# Patient Record
Sex: Male | Born: 1960
Health system: Southern US, Community
[De-identification: ages and names within clinical notes are randomized; demographics above are authoritative.]

## PROBLEM LIST (undated history)

## (undated) DIAGNOSIS — K219 Gastro-esophageal reflux disease without esophagitis: Secondary | ICD-10-CM

## (undated) DIAGNOSIS — F1910 Other psychoactive substance abuse, uncomplicated: Secondary | ICD-10-CM

## (undated) DIAGNOSIS — C801 Malignant (primary) neoplasm, unspecified: Secondary | ICD-10-CM

## (undated) DIAGNOSIS — K589 Irritable bowel syndrome without diarrhea: Secondary | ICD-10-CM

## (undated) DIAGNOSIS — E785 Hyperlipidemia, unspecified: Secondary | ICD-10-CM

## (undated) DIAGNOSIS — K5790 Diverticulosis of intestine, part unspecified, without perforation or abscess without bleeding: Secondary | ICD-10-CM

## (undated) DIAGNOSIS — E079 Disorder of thyroid, unspecified: Secondary | ICD-10-CM

## (undated) DIAGNOSIS — E039 Hypothyroidism, unspecified: Secondary | ICD-10-CM

## (undated) DIAGNOSIS — Z8719 Personal history of other diseases of the digestive system: Secondary | ICD-10-CM

## (undated) DIAGNOSIS — I10 Essential (primary) hypertension: Secondary | ICD-10-CM

## (undated) HISTORY — PX: OTHER SURGICAL HISTORY: SHX169

## (undated) HISTORY — DX: Irritable bowel syndrome, unspecified: K58.9

## (undated) HISTORY — DX: Other psychoactive substance abuse, uncomplicated: F19.10

## (undated) HISTORY — DX: Disorder of thyroid, unspecified: E07.9

## (undated) HISTORY — PX: FINGER SURGERY: SHX640

## (undated) HISTORY — DX: Hyperlipidemia, unspecified: E78.5

## (undated) HISTORY — DX: Personal history of other diseases of the digestive system: Z87.19

## (undated) HISTORY — DX: Gastro-esophageal reflux disease without esophagitis: K21.9

## (undated) HISTORY — DX: Diverticulosis of intestine, part unspecified, without perforation or abscess without bleeding: K57.90

---

## 1999-06-10 ENCOUNTER — Encounter: Payer: Self-pay | Admitting: Family Medicine

## 1999-06-10 ENCOUNTER — Ambulatory Visit (HOSPITAL_COMMUNITY): Admission: RE | Admit: 1999-06-10 | Discharge: 1999-06-10 | Payer: Self-pay | Admitting: Family Medicine

## 2001-08-20 ENCOUNTER — Encounter: Payer: Self-pay | Admitting: Emergency Medicine

## 2001-08-20 ENCOUNTER — Observation Stay (HOSPITAL_COMMUNITY): Admission: EM | Admit: 2001-08-20 | Discharge: 2001-08-21 | Payer: Self-pay | Admitting: Emergency Medicine

## 2001-11-09 ENCOUNTER — Ambulatory Visit (HOSPITAL_BASED_OUTPATIENT_CLINIC_OR_DEPARTMENT_OTHER): Admission: RE | Admit: 2001-11-09 | Discharge: 2001-11-09 | Payer: Self-pay | Admitting: Orthopedic Surgery

## 2002-11-20 ENCOUNTER — Inpatient Hospital Stay (HOSPITAL_COMMUNITY): Admission: AD | Admit: 2002-11-20 | Discharge: 2002-11-22 | Payer: Self-pay | Admitting: Obstetrics & Gynecology

## 2002-11-22 ENCOUNTER — Encounter (INDEPENDENT_AMBULATORY_CARE_PROVIDER_SITE_OTHER): Payer: Self-pay | Admitting: *Deleted

## 2004-07-23 ENCOUNTER — Emergency Department (HOSPITAL_COMMUNITY): Admission: EM | Admit: 2004-07-23 | Discharge: 2004-07-23 | Payer: Self-pay | Admitting: Emergency Medicine

## 2005-01-09 ENCOUNTER — Emergency Department (HOSPITAL_COMMUNITY): Admission: EM | Admit: 2005-01-09 | Discharge: 2005-01-09 | Payer: Self-pay | Admitting: Emergency Medicine

## 2005-01-09 IMAGING — CT CT PELVIS W/ CM
4 of 6 series · 12 of 46 positions shown, 18 images · IV contrast (omnipaque)
Comparison: none

CLINICAL DATA: Left sided chest and abdominal pain for four days.  History of diverticulosis and left sided kidney stones. 
CT ANGIOGRAPHY OF CHEST:
TECHNIQUE: Multidetector CT imaging of the chest was performed during bolus injection of intravenous contrast.  Multiplanar CT angiographic image reconstructions were generated to evaluate the vascular anatomy.
Contrast:  150 cc Omnipaque 300
No pathologically enlarged axillary lymphadenopathy.  Mild bilateral hilar lymphadenopathy is noted.  There are no pleural or pericardial effusions.  Multiple tiny cysts are seen throughout the lungs.  The pattern and distribution is not typical for emphysema.  No pleural effusions are identified.  There is no pericardial effusion.  No suspicious pulmonary nodules or masses are noted.
TECHNIQUE: Multidetector CT imaging of the abdomen was performed following the standard protocol during bolus administration of intravenous contrast.
TECHNIQUE: Multidetector CT imaging of the pelvis was performed following the standard protocol during bolus administration of intravenous contrast.

[Series 171: — · axial · 0.70mm/px · z∈[+1293,+1523]mm · 3 of 94 slices shown, 7 images (1 of 2)]
[im 24/94  soft-tissue]
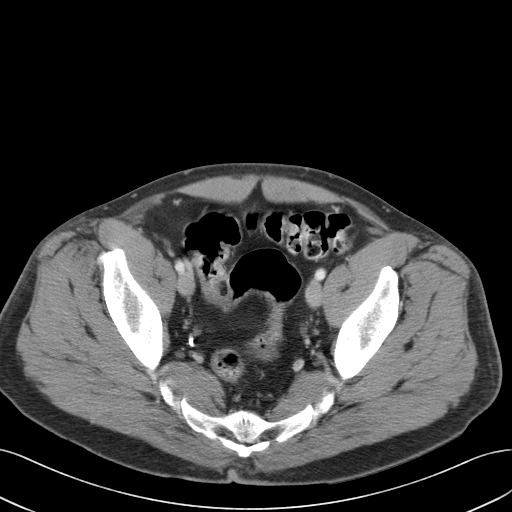
[im 24/94  lung]
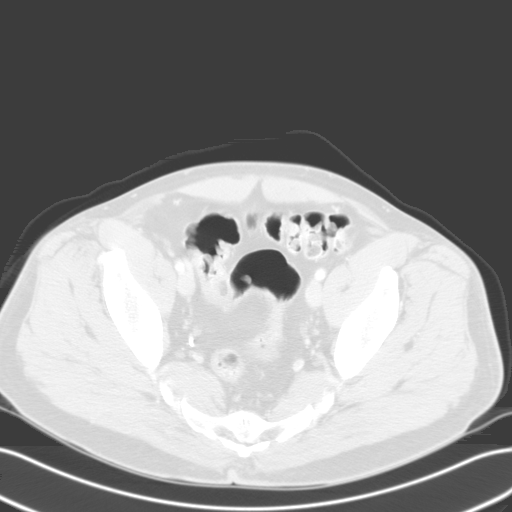
[im 24/94  bone]
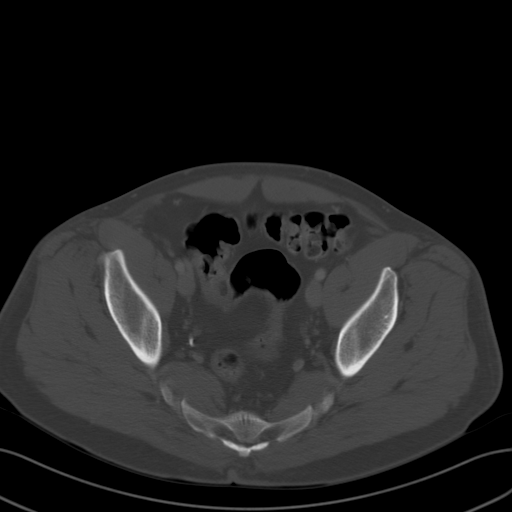
[im 47/94  soft-tissue]
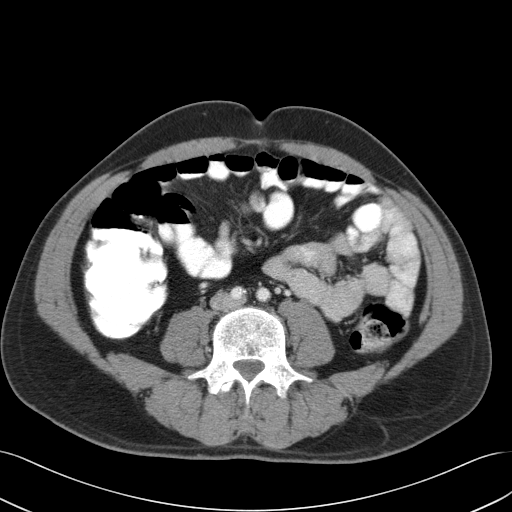
[im 47/94  lung]
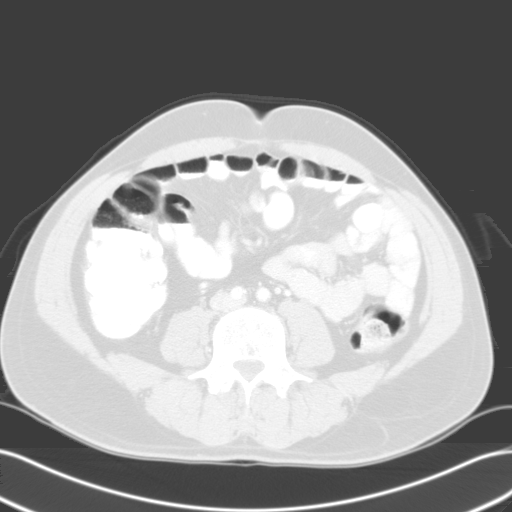
[im 70/94  soft-tissue]
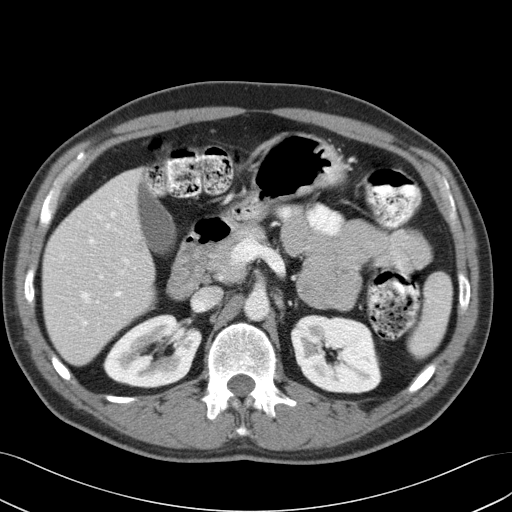
[im 70/94  lung]
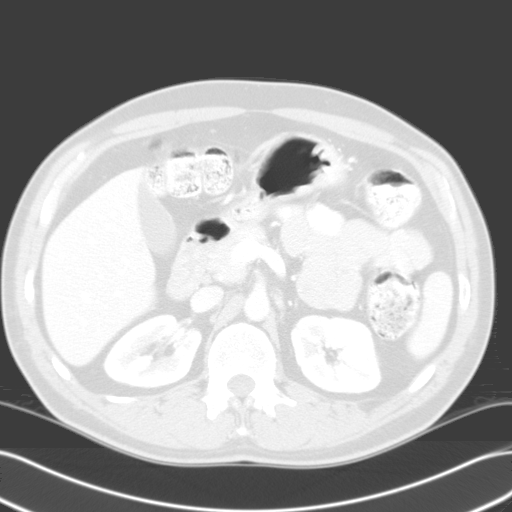

[Series 174: — · axial · 0.88mm/px · z∈[+1629,+1734]mm · 5 of 373 slices shown (2 of 2)]
[im 40/373  soft-tissue]
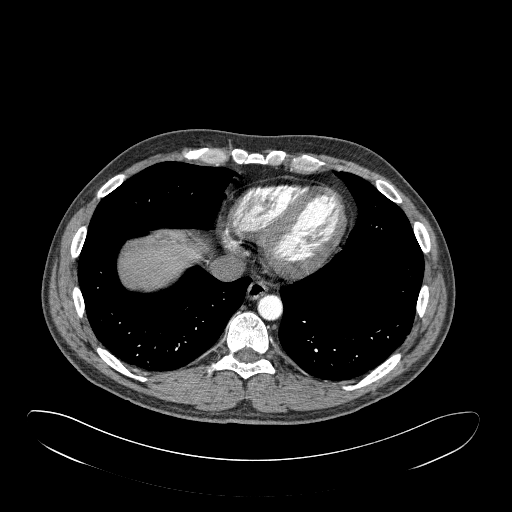
[im 79/373  soft-tissue]
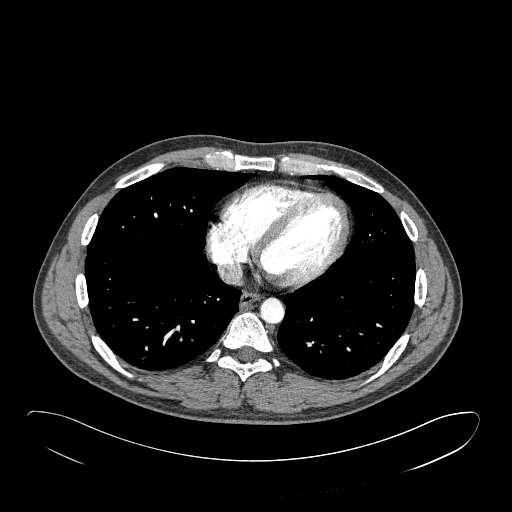
[im 118/373  soft-tissue]
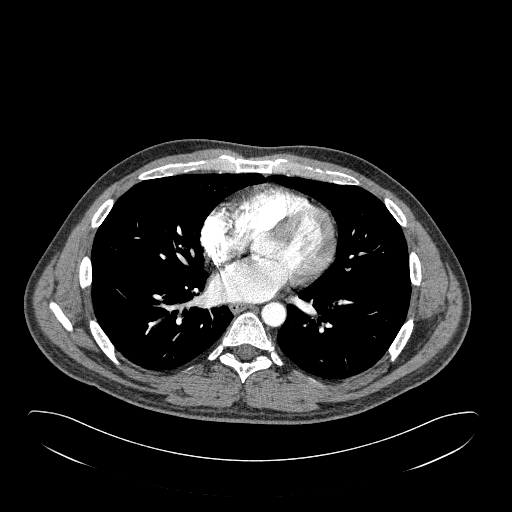
[im 157/373  soft-tissue]
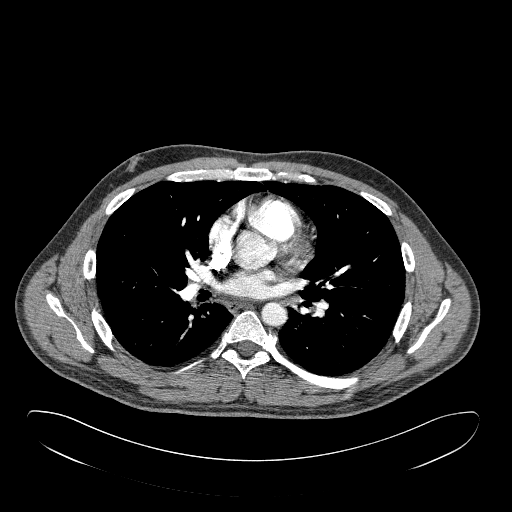
[im 216/373  soft-tissue]
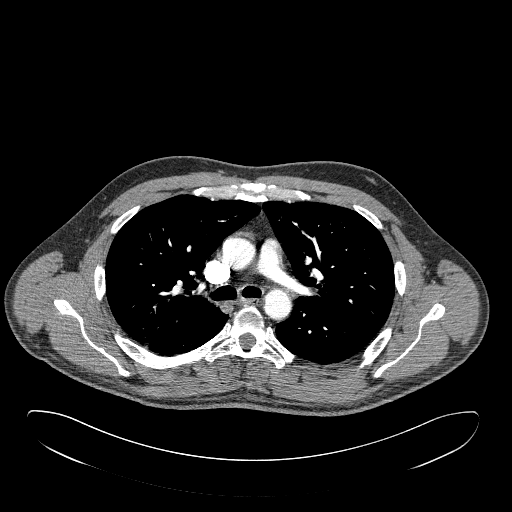

[coronals · coronal · 0.88mm/px · 3 of 60 slices shown, 4 images]
[im 20/60  soft-tissue]
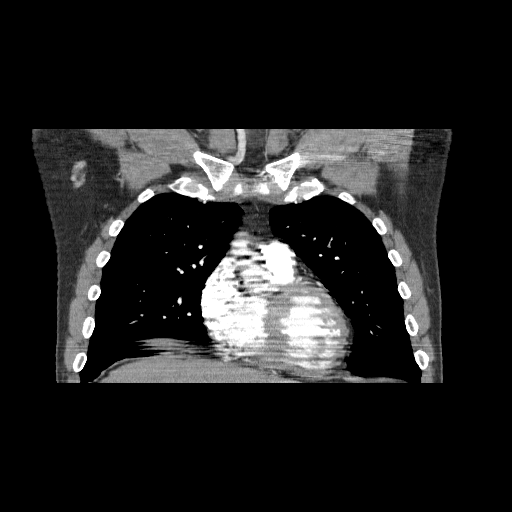
[im 27/60  soft-tissue]
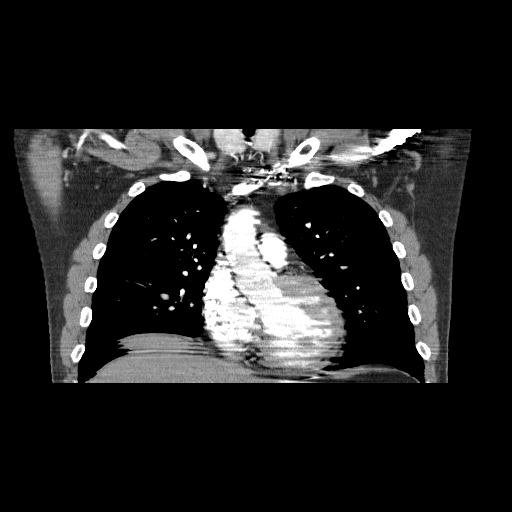
[im 27/60  bone]
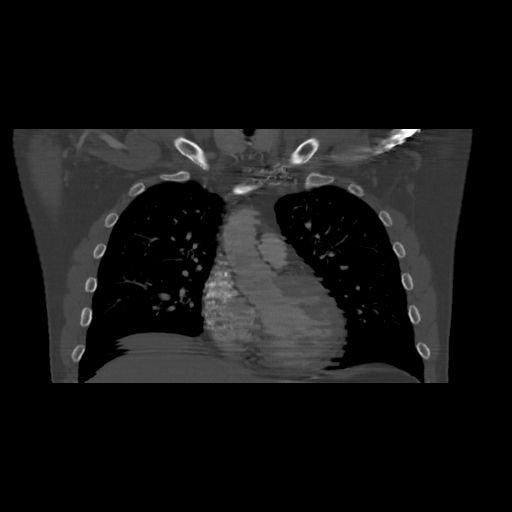
[im 33/60  soft-tissue]
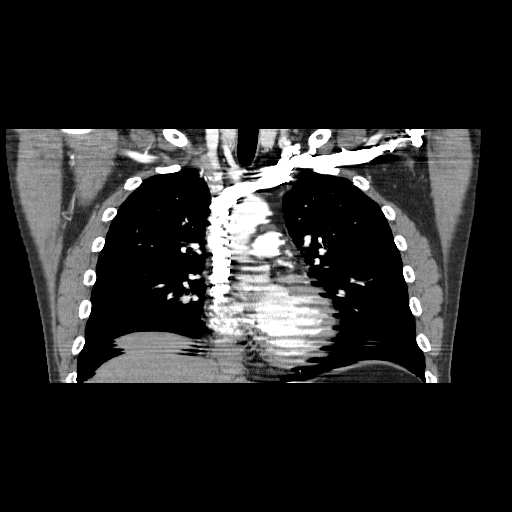

[sagittals · sagittal · 0.88mm/px · 1 of 70 slices shown, 2 images]
[im 24/70  soft-tissue]
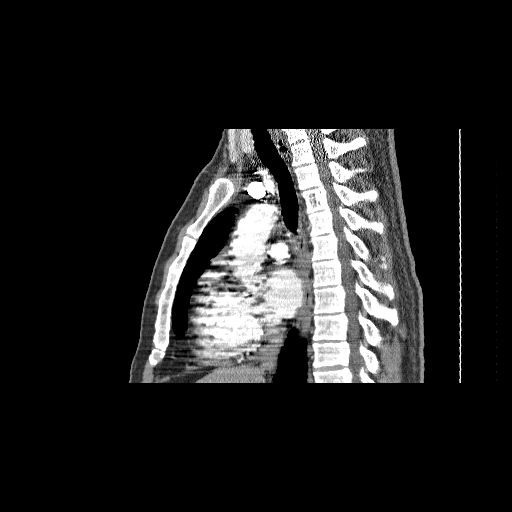
[im 24/70  bone]
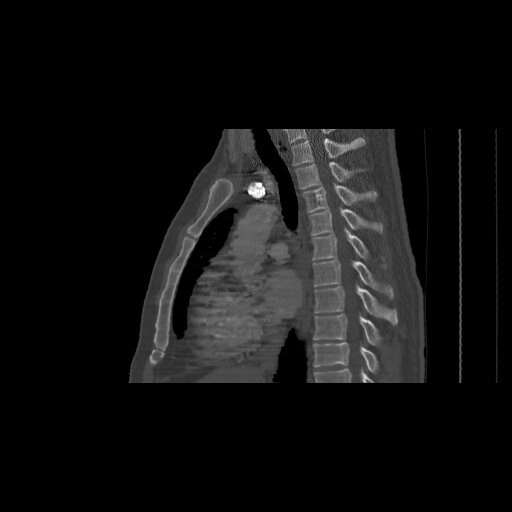

[12 of 46 positions shown; findings below may reference images not displayed]

IMPRESSION: 1.  No acute cardiopulmonary abnormalities. No evidence for pulmonary emboli.
2.  Mild bilateral hilar lymphadenopathy.  Largest is on the left measuring 13.2 x 8.3 mm.  This may be inflammatory or infectious in etiology such as from granulomatous inflammation.  Tumor is less favored.  
ABDOMEN CT WITH CONTRAST:
FINDINGS: Liver is normal in attenuation and morphology.  
Negative. 
Adrenal glands negative.  Pancreas is negative.  Kidneys are negative.  Spleen negative. No pathologically enlarged retroperitoneal or mesenteric lymphadenopathy.  No free fluid.  Visualized bowel loops are unremarkable.
IMPRESSION: No acute abdomen findings.
PELVIS CT WITH CONTRAST:
FINDINGS: Negative for pelvic lymphadenopathy.  No free pelvic fluid.  The pelvic bowel loops are negative.  
No inguinal lymphadenopathy.
IMPRESSION: No acute pelvic CT findings.

## 2006-08-09 ENCOUNTER — Emergency Department (HOSPITAL_COMMUNITY): Admission: EM | Admit: 2006-08-09 | Discharge: 2006-08-10 | Payer: Self-pay | Admitting: Emergency Medicine

## 2006-08-10 ENCOUNTER — Ambulatory Visit: Payer: Self-pay | Admitting: Psychiatry

## 2006-08-10 ENCOUNTER — Inpatient Hospital Stay (HOSPITAL_COMMUNITY): Admission: EM | Admit: 2006-08-10 | Discharge: 2006-08-14 | Payer: Self-pay | Admitting: Psychiatry

## 2006-12-24 ENCOUNTER — Emergency Department (HOSPITAL_COMMUNITY): Admission: EM | Admit: 2006-12-24 | Discharge: 2006-12-24 | Payer: Self-pay | Admitting: Emergency Medicine

## 2006-12-26 ENCOUNTER — Emergency Department (HOSPITAL_COMMUNITY): Admission: EM | Admit: 2006-12-26 | Discharge: 2006-12-27 | Payer: Self-pay | Admitting: Emergency Medicine

## 2007-07-16 ENCOUNTER — Emergency Department (HOSPITAL_COMMUNITY): Admission: EM | Admit: 2007-07-16 | Discharge: 2007-07-16 | Payer: Self-pay | Admitting: Emergency Medicine

## 2010-07-31 ENCOUNTER — Emergency Department (HOSPITAL_COMMUNITY)
Admission: EM | Admit: 2010-07-31 | Discharge: 2010-07-31 | Disposition: A | Discharge status: Home / Self Care | Payer: Managed Care, Other (non HMO) | Attending: Emergency Medicine | Admitting: Emergency Medicine

## 2010-07-31 ENCOUNTER — Emergency Department (HOSPITAL_COMMUNITY): Payer: Managed Care, Other (non HMO)

## 2010-07-31 DIAGNOSIS — F17200 Nicotine dependence, unspecified, uncomplicated: Secondary | ICD-10-CM | POA: Insufficient documentation

## 2010-07-31 DIAGNOSIS — IMO0002 Reserved for concepts with insufficient information to code with codable children: Secondary | ICD-10-CM | POA: Insufficient documentation

## 2010-07-31 IMAGING — CR DG HIP (WITH OR WITHOUT PELVIS) 2-3V*L*
3 series · 3 of 3 positions shown · non-contrast
Comparison: None.

CLINICAL DATA: Low back pain radiates to the left hip.

LEFT HIP - COMPLETE 2+ VIEW

[view not recorded (1 of 3)]
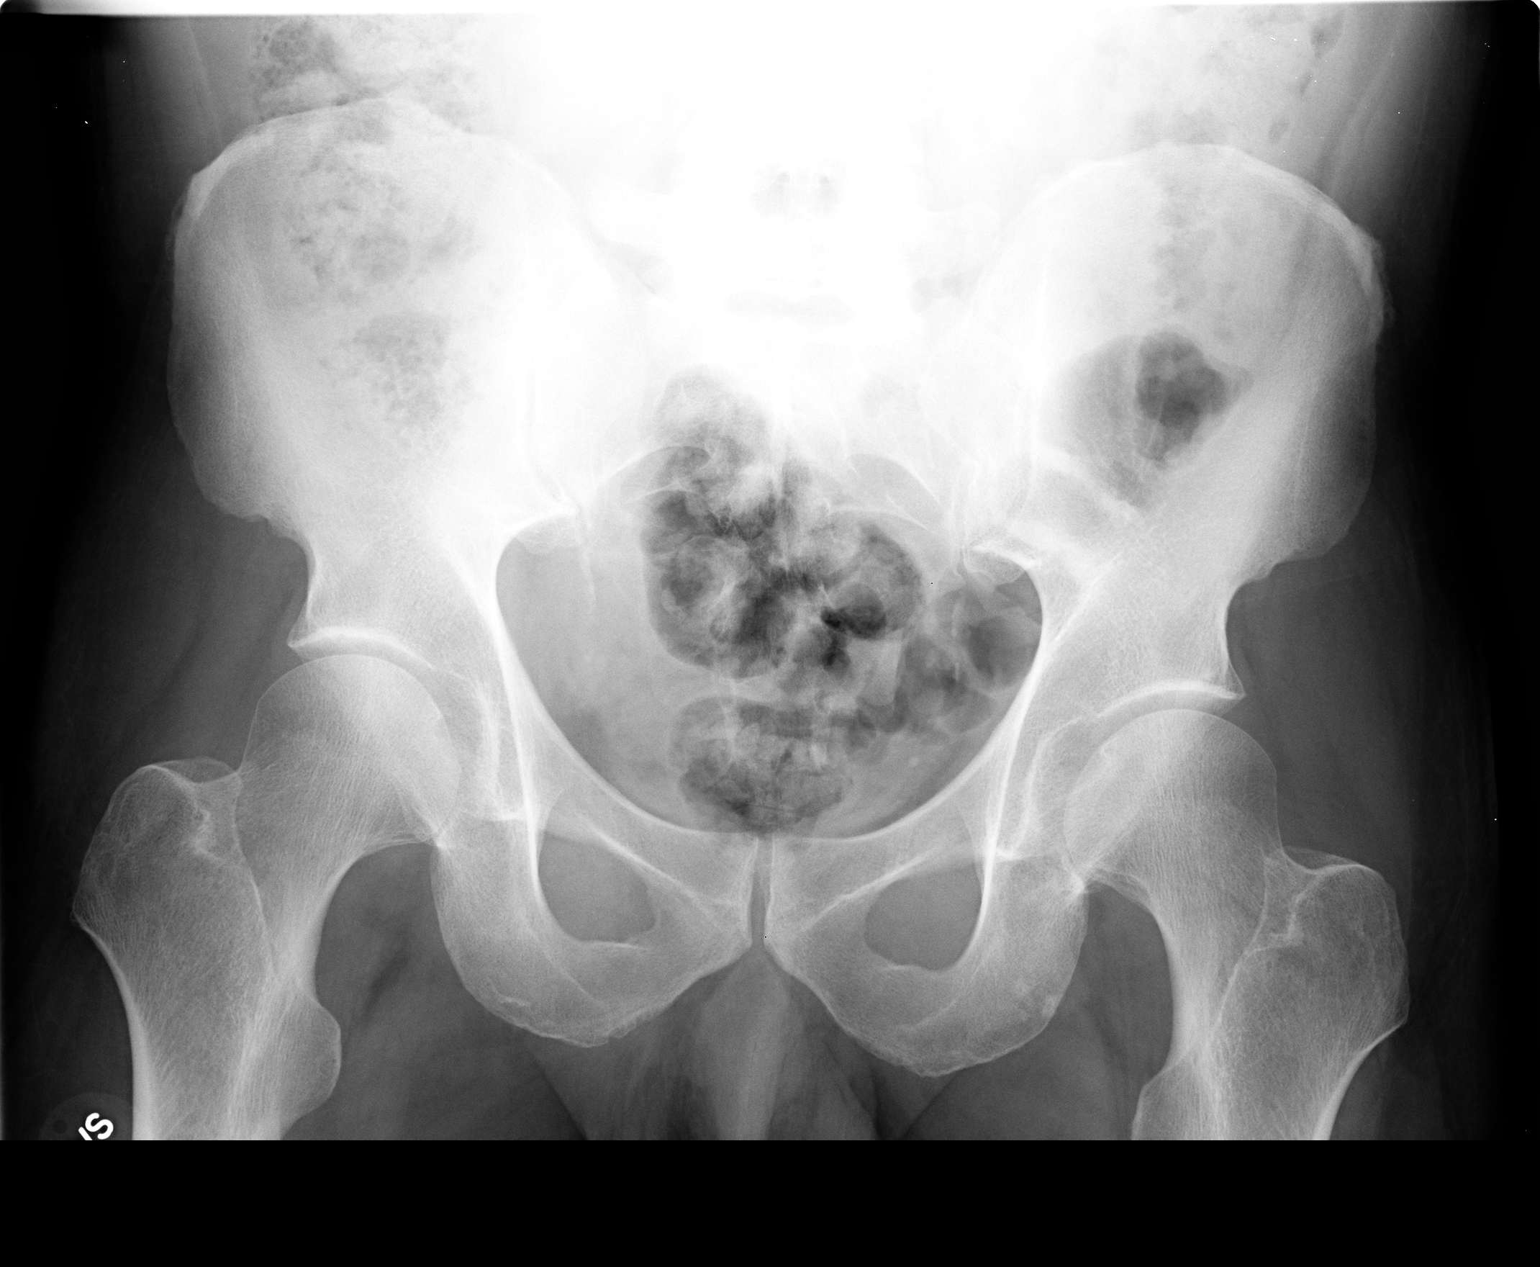

[view not recorded (2 of 3)]
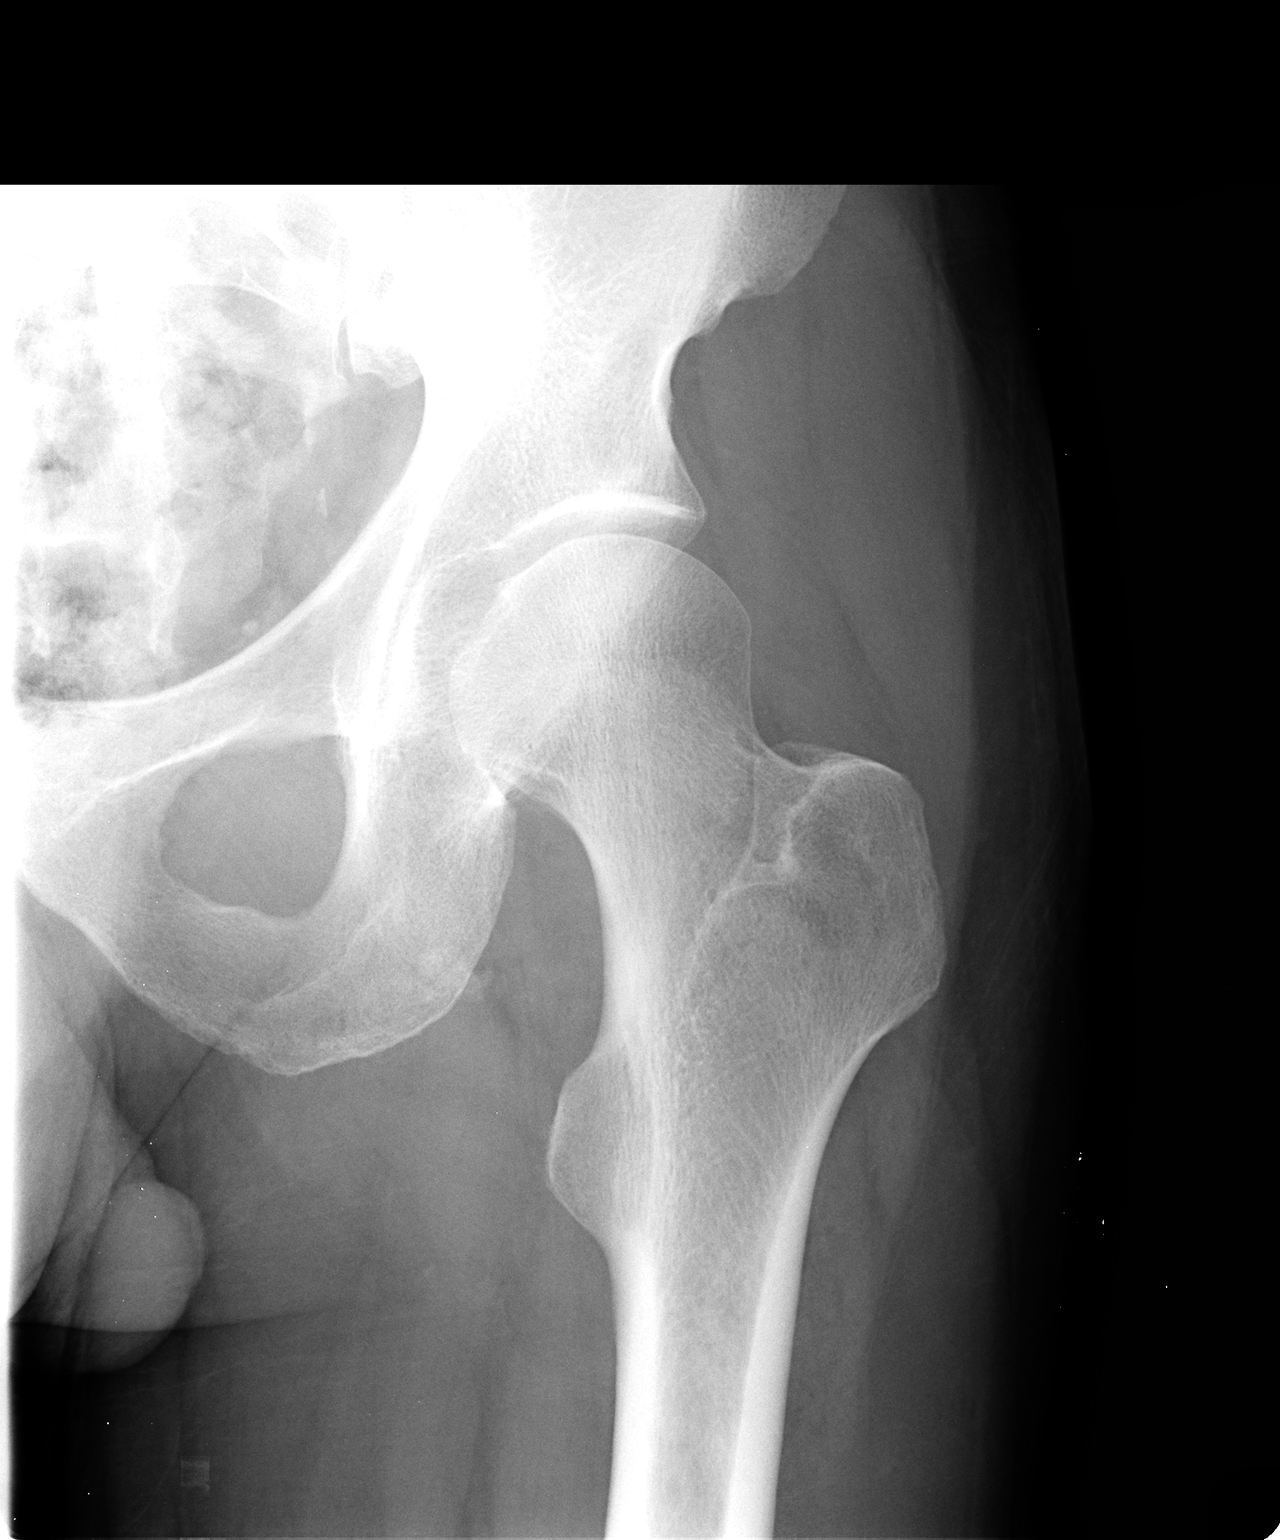

[view not recorded (3 of 3)]
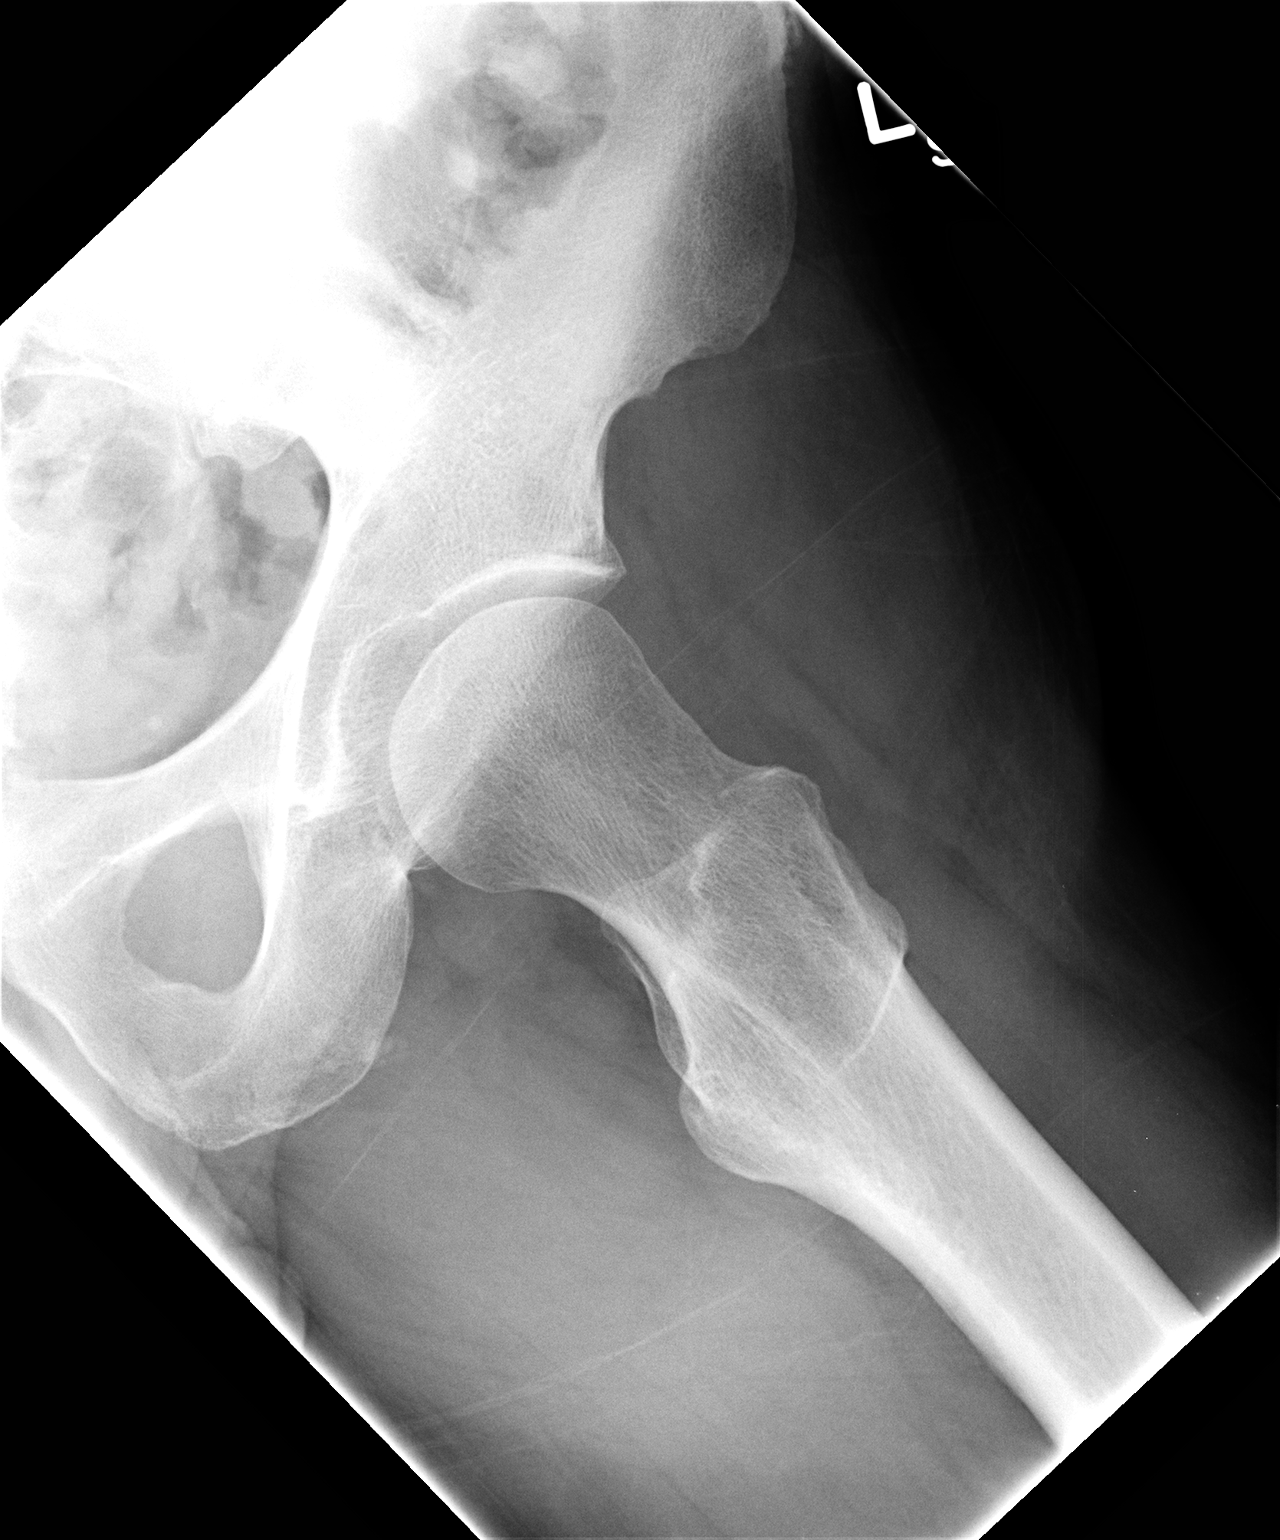

[3 of 3 positions shown; findings below may reference images not displayed]

FINDINGS: Frontal pelvis shows no evidence for fracture.  SI joints
and symphysis pubis are normal.  Joint space in the hips is well
preserved and symmetric.  No substantial degenerative change in
either hip.

AP and frog-leg lateral views of the left hip are normal.
IMPRESSION: No radiographic evidence to explain the patient's history of pain.

## 2010-07-31 IMAGING — CR DG LUMBAR SPINE COMPLETE 4+V
5 series · 5 of 5 positions shown · non-contrast
Comparison: None.

CLINICAL DATA: Left back pain.

LUMBAR SPINE - COMPLETE 4+ VIEW

[view not recorded (1 of 5)]
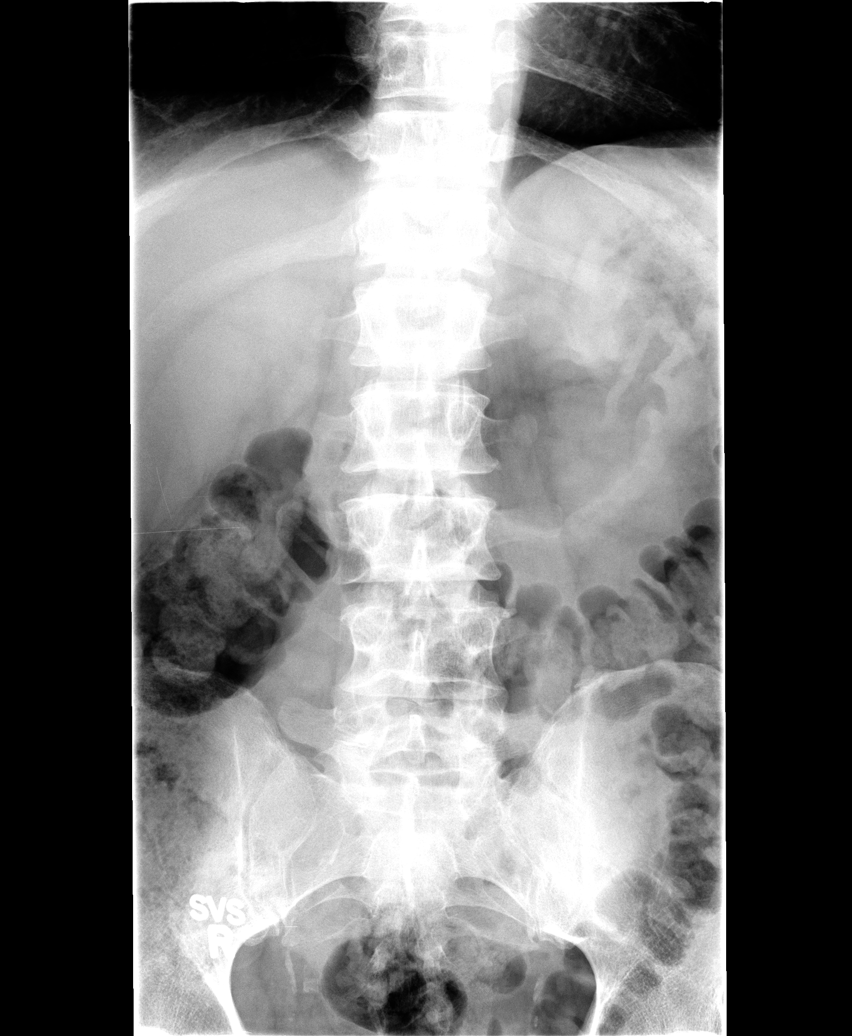

[view not recorded (2 of 5)]
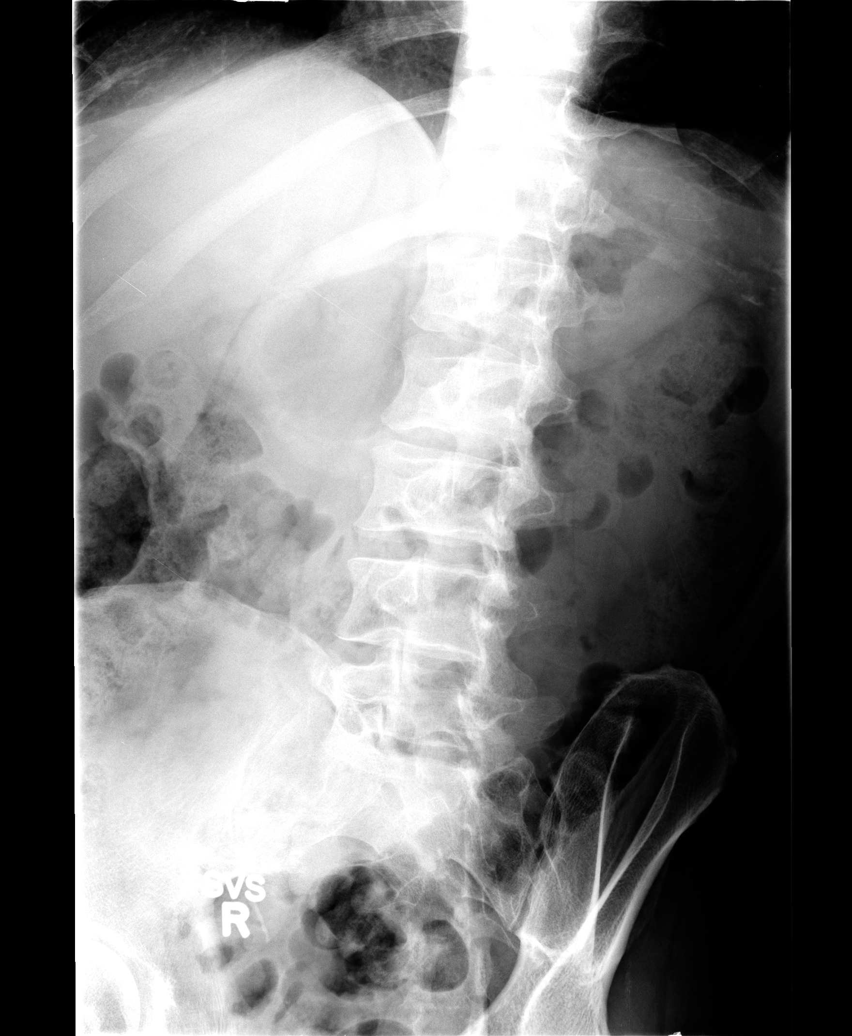

[view not recorded (3 of 5)]
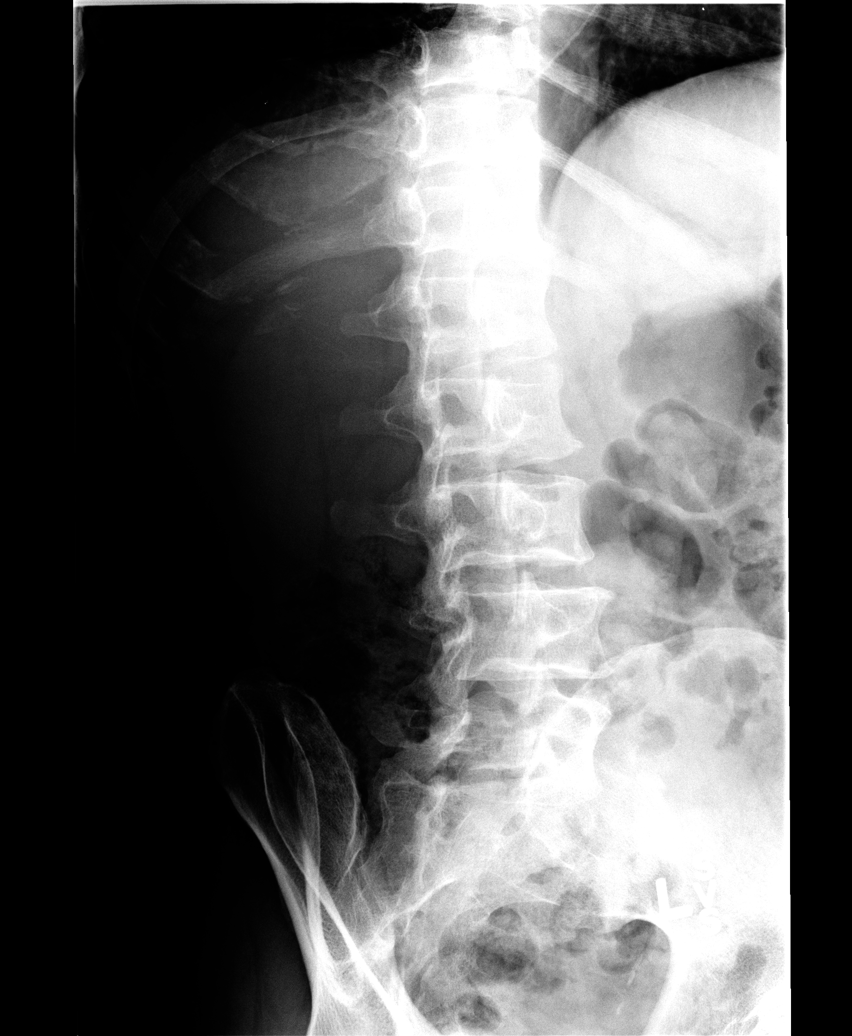

[view not recorded (4 of 5)]
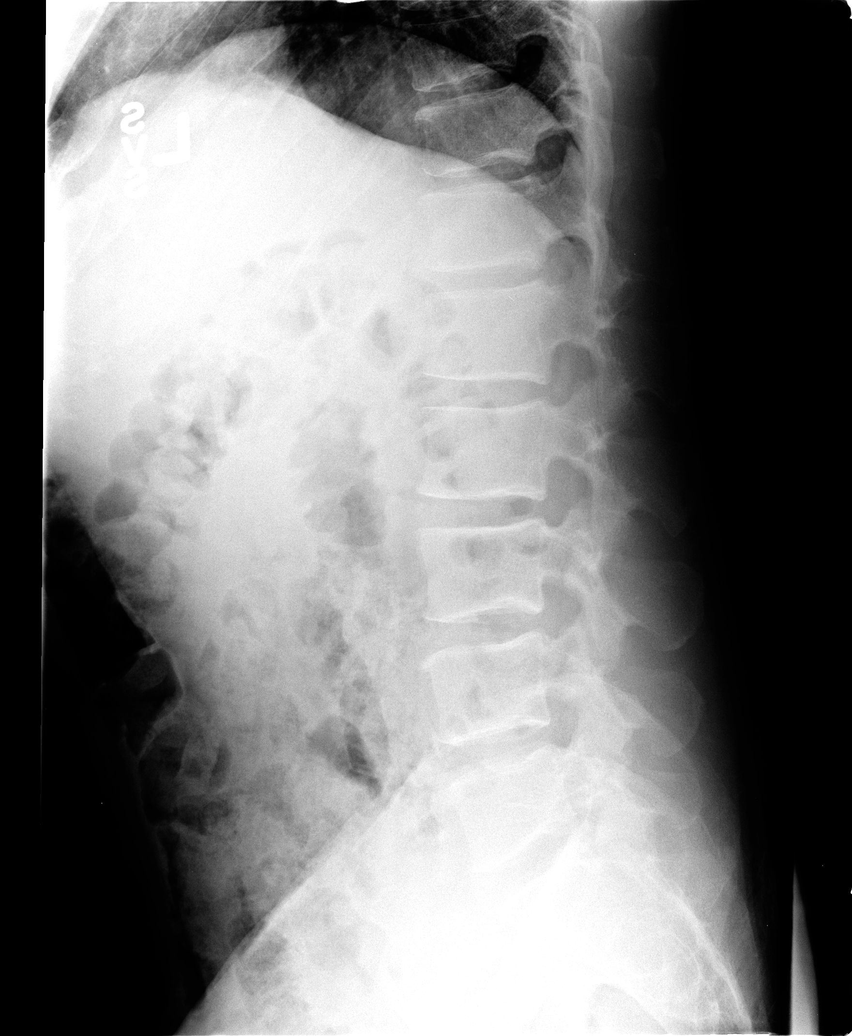

[view not recorded (5 of 5)]
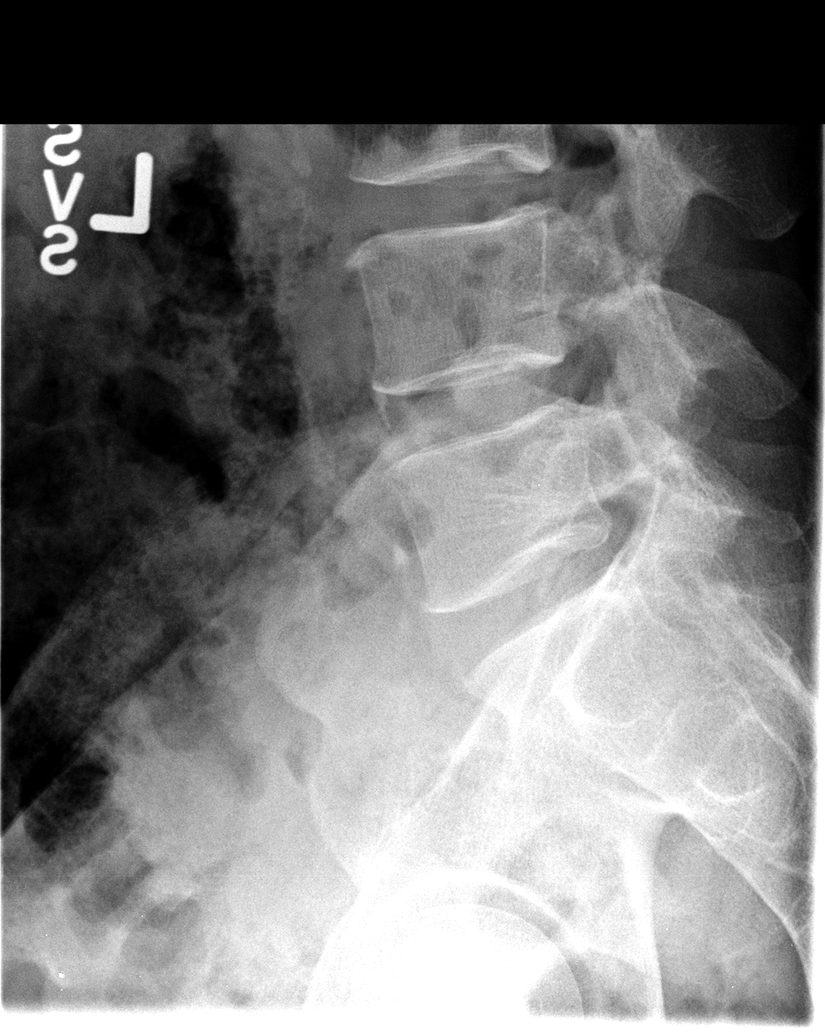

[5 of 5 positions shown; findings below may reference images not displayed]

FINDINGS: No evidence for an acute fracture.  No subluxation.  No
substantial loss of intervertebral disc height.  The facets are
well-aligned bilaterally.  SI joints are normal.
IMPRESSION: No acute bony findings.

## 2010-09-10 NOTE — H&P (Signed)
NAME:  Joseph Atkins, Joseph Atkins                      ACCOUNT NO.:  [id number]   MEDICAL RECORD NO.:  [id number]                   PATIENT TYPE:  INP   LOCATION:                                       FACILITY:  [facility]   PHYSICIAN:  Barbette Hair. Arlyce Dice, M.D. [facility]          DATE OF BIRTH:  09-30-1960   DATE OF ADMISSION:  11/20/2002  DATE OF DISCHARGE:                                HISTORY & PHYSICAL   PROBLEM:  Abdominal pain.   REASON FOR ADMISSION:  The patient is a 50 year old white male with a three  day history of severe left-sided abdominal pain.  He had similar, but much  less severe pain approximately one month ago.  Over the last three days he  has been complaining of sharp and aching left lower quadrant pain.  It is  worse with sitting and with walking.  It is unaffected by eating or bowel  movements.  He is without fever, chills, nausea, or vomiting.  There is no  history of melena or hematochezia.  He is a heavy smoker, smoking two to  three packs a day.  Today a CBC was pertinent for a white count of 11.2.  Urinalysis was normal.  He underwent a CT scan at [facility] that  demonstrated segmental thickening of the left colonic wall.  No diverticular  changes were seen.   PAST MEDICAL HISTORY:  1. Kidney stones.  2. Sleep apnea.   FAMILY HISTORY:  Noncontributory.   MEDICATIONS:  He is on no regular medicines.   ALLERGIES:  PENICILLIN.   SOCIAL HISTORY:  Smoking is as previously stated.  He was a heavy drinker  until seven to eight years ago and drinks very rarely at present.  He is  married.  Is a truck driver.  He drinks 10-15 cups of coffee a day.   REVIEW OF SYSTEMS:  Positive for fatigue.   PHYSICAL EXAMINATION:  VITAL SIGNS:  Pulse 72, blood pressure 140/88, weight  182, temperature 96.3.  HEENT:  Within normal limits.  CHEST:  Clear.  CARDIAC:  No murmur, rub, or gallop.  ABDOMEN:  There is exquisite tenderness with guarding and rebound in the  left lower  quadrant.  There are no abdominal masses or organomegaly.  RECTAL:  No rectal masses.  Stool is brown and hemoccult-negative.   IMPRESSION:  1. Segmental colitis.  This most likely is due to ischemic colitis.     Idiopathic colitis is less likely as is diverticulitis.  2. History of tobacco abuse.   RECOMMENDATIONS:  1. Admit for bowel rest and IV hydration.  2.     Begin antibiotics, IV Flagyl and Cipro.  3. Follow up serial Hemoccults.  4. The patient will require colonoscopy at a later time.  Barbette Hair. Arlyce Dice, M.D. [facility]    RDK/MEDQ  D:  11/20/2002  T:  11/20/2002  Job:  [id number]   cc:   Charlesetta Shanks  [address]  [place] [address]  Fax: [phone]

## 2010-09-10 NOTE — Op Note (Signed)
NAME:  BROOK, MALL                       ACCOUNT NO.:  [id number]   MEDICAL RECORD NO.:  [id number]                   PATIENT TYPE:  INP   LOCATION:  5730                                 FACILITY:  [facility]   PHYSICIAN:  Lina Sar, M.D. [facility]               DATE OF BIRTH:  Sep 04, 1960   DATE OF PROCEDURE:  11/22/2002  DATE OF DISCHARGE:                                 OPERATIVE REPORT   PROCEDURE:  Flexible sigmoidoscopy.   INDICATIONS:  This 50 year old white male was admitted with acute onset of  left lower quadrant abdominal pain and CT scan showing inflammatory changes  in the left colon suggestive of acute colitis.  He was afebrile.  His white  cell count was normal.  There was marked tenderness in the left lower  quadrant.  The patient gave no history of diarrhea or bloody stools.  He has  a history of remote alcohol abuse and drug abuse.  His symptoms were  somewhat atypical for acute colitis.  For this reason, he is undergoing  flexible sigmoidoscopy to assess the etiology of his left lower quadrant  abdominal pain.   ENDOSCOPE:  Fujinon single-channel videoendoscope.   SEDATION:  Versed 7 mg IV, fentanyl 60 mcg IV.   FINDINGS:  Fujinon single-channel videoendoscope passed under direct vision  through the rectum to sigmoid colon.  The patient was monitored by pulse  oximeter.  His oxygen saturations were normal.  His prep included two Fleet  enemas.  __________ unremarkable.  Starting in the rectal ampulla, there  were multiple small polyps measuring between 5 mm to 10 mm in diameter.  They were soft and sessile and at least 10 of them were removed with cold  biopsy between the rectum and sigmoid colon.  These polyps clearly were not  the cause of the patient's abdominal pain.  The mucosa itself appeared  normal.  Endoscope passed easily through the sigmoid colon which showed no  evidence of diverticulosis through the descending colon to about 70 cm of  splenic flexure  where the colon was not prepped at that point.  I could not  appreciate any ischemic changes or any acute mucosal abnormality.  Random  biopsies were taken of the left colon.  The colonoscope was then retracted  and the colon decompressed.   IMPRESSION:  1. No evidence of acute colitis, status post random biopsies.  2. Multiple sigmoid polyps, some of them removed.   PLAN:  The patient's abdominal pain is unexplained at this point.  He does  not have any evidence of acute mucosal colitis.  We will advance his diet,  stop his antibiotics, and have the CT scan reviewed again as to the etiology  of left lower quadrant abdominal pain.  We will obtain repeat urine specimen  and start antispasmodic.  The patient will require a full colonoscopic exam  and multiple polypectomies when his acute problem resolves.  Lina Sar, M.D. [facility]    DB/MEDQ  D:  11/22/2002  T:  11/22/2002  Job:  [id number]

## 2010-09-10 NOTE — Discharge Summary (Signed)
NAME:  Joseph Atkins, Joseph Atkins                       ACCOUNT NO.:  [id number]   MEDICAL RECORD NO.:  [id number]                   PATIENT TYPE:  INP   LOCATION:  5730                                 FACILITY:  [facility]   PHYSICIAN:  Lina Sar, M.D. [facility]               DATE OF BIRTH:  12-08-1960   DATE OF ADMISSION:  11/20/2002  DATE OF DISCHARGE:                                 DISCHARGE SUMMARY   ADMITTING DIAGNOSES:  1. Acute left-sided abdominal pain.  A CT scan on the morning of admission     showing focal thickening in the left colon possibly representing colitis.     Rule out ischemic colitis, rule out infectious colitis, rule out     inflammatory bowel disease.  2. Leukocytosis.  3. Chronic tobacco habituation, up to three packs of cigarettes daily.  4. History of polysubstance abuse.  5. History of nephrolithiasis.  6. Gastroesophageal reflux disease.  7. Depression.  8. Status post injury to right upper extremities.  Status post surgery to     the right forearm and right middle finger.  9. Chronic neck pain, question herniated nucleus pulposus versus     degenerative disk disease.  The patient declined cervical spine surgery     three to four years ago.   DISCHARGE DIAGNOSES:  1. Left-sided abdominal pain of unclear etiology.  2. Status post flexible sigmoidoscopy to 70 cm showing no evidence for     colitis but did show multiple colon polyps in the sigmoid and rectum;     will need followup complete colonoscopy in the future.  3. Tobacco habituation.  Treated during this admission with nicotine patches     and plan to send home with two-week prescription for nicotine patches.  4. History of nephrolithiasis.  Urinalysis was normal prior to admission.  A     repeat urine is to be obtained prior to his discharge but the pain he     experienced not consistent with prior episodes of kidney stone pain.   CONSULTATIONS:  None.   PROCEDURE:  Flexible sigmoidoscopy by Dr. Lina Sar on July 30.  The prep  consisted of a couple of Fleet's enemas.  Findings were at least 10 polyps  in the rectal and sigmoid region approximately 5-10 mm in size and sessile  in nature.  Random biopsies of the polyps were obtained.  The remainder of  the colon to 70 cm was normal.  Specifically, no evidence for colitis.   BRIEF HISTORY:  Joseph Atkins is a 50 year old white male who was referred  to Dr. Arlyce Dice by Dr. Rudi Heap.  He had had a day or two episode of left  lower abdominal pain about a month previously but this resolved  spontaneously and was not associated with any other GI symptoms.  About 3-4  days prior to this admission, he developed recurrent left lower quadrant  pain which did  not resolve and progressed in nature.  He was seen by Dr.  Celene Skeen at [facility] on July 27 and urinalysis was  negative.  White blood cell count was somewhat elevated at 11,200.  Differential not available when seen by Dr. Arlyce Dice.  He had a CT scan at  [facility] on the morning of July 28 and this was interpreted as some  focal thickening of the colon wall in the left colon probably representing a  colitis.  He was then referred to Dr. Arlyce Dice, who also felt that this colon  wall was thickened and the patient had exquisite tenderness with some  rebound focally in the left colon.  He was Hemoccult negative.  The patient  was sent to the hospital for direct admission for supportive care, bowel  rest, and further evaluation.  Interestingly, the patient had been eating  solid foods up until the time of admission and was not having any increase  in pain associated with p.o.  Bowel movements were unchanged and he denied  constipation or diarrhea and had not been having any blood in his stool.  He  was having no fevers, chills, or nausea.  The patient is a very heavy  smoker, usually 2-3 packs a day, and Dr. Arlyce Dice was concerned that the  patient may have ischemic  colitis.   LABORATORY DATA:  BMET was within normal limits.  Sodium 137, potassium 3.9,  BUN 6, creatinine 1, glucose 102.  CBC of November 21, 2002 showed a hemoglobin  of 14.3, hematocrit of 40.4, white blood cell count 9.2, and platelets of  187.  Erythrocyte sedimentation rate was 10.  Urinalysis was not repeated  initially and is to be repeated before the patient goes home today.   HOSPITAL COURSE:  The patient was admitted to a nontelemetry bed.  He was  started empirically on Cipro and Flagyl.  Overnight, pain control was  adequate with 50 of Demerol accompanied by p.r.n. Phenergan; however, pain  would recur as the narcotics wore off.  By hospital day #2, he had improved  somewhat with decreased pain.  He was passing flatus but had not had any  stools; however, he still was having quite marked focal tenderness in the  left lower quadrant.  There were no masses visible, no hematomas visible in  this region.   Dr. Juanda Chance elected to prep him in the morning of July 30 and perform  flexible sigmoidoscopy.  The findings of the flexible sigmoidoscopy are  noted above.  Significantly, there was no etiology as the source of the  patient's abdominal pain, so she felt perhaps that this could be  musculoskeletal.  The films from [facility] were reviewed by Dr. Irish Lack, who felt that there was actually no thickening in the colon wall  and what was being interpreted as thickening was an increased amount of  stool within the left colon.   The patient was felt stable for discharge after his flexible sigmoidoscopy.  For management of his pain, he was provided with prescriptions for a limited  amount of Darvocet and Levbid.  He also requested a prescription for  nicotine patches.   The patient had been denied smoking privileges while here in the hospital  and seemed to do fairly well with the nicotine patches to help with nicotine  withdrawal.  Dr. Juanda Chance wanted to get a final  urinalysis just to make sure that this was  not anything to do with the  urologic system.  Dr. Fredia Sorrow had not seen any  obvious signs of uretero- or nephrolithiasis on the CT scan which he  reviewed.   The patient's findings as to the CT scan being re-read, as well as the  flexible sigmoidoscopy, were discussed with the patient and his wife.  They  were alerted to the fact that the patient would need to have a followup  colonoscopy in order to remove the colon polyps that had been seen on  flexible sigmoidoscopy and also in order to look for any further polyps  within the right colon.  They understood that we did not find any specific  etiology for the patient's pain but that perhaps it was musculoskeletal in  nature.  He has a followup appointment to see Dr. Arlyce Dice back in the office  on Monday, August 30, at which point a colonoscopy will be arranged.   CONDITION AT DISCHARGE:  Stable.   DISCHARGE MEDICATIONS:  1. Levbid 0.375 mg 1 p.o. twice daily for abdominal cramping to be used for     three days and then he can taper off if the symptoms are improving.  2. Darvocet-N 100 one to two p.o. q.6h. p.r.n. pain.  3. Prevacid 30 mg daily.  4. Zoloft 100 mg daily.  5. NicoDerm CQ patch 21 mg applied once daily.   DISCHARGE INSTRUCTIONS:  The patient okay to return to work Monday, August  2.  He was instructed to stop smoking and use the NicoDerm CQ patches.  If  he resumed cigarette smoking, he was to remove the patches and discontinue  use of the nicotine patches at that point.      Jennye Moccasin, P.A. [facility]                   Lina Sar, M.D. [facility]    SG/MEDQ  D:  11/22/2002  T:  11/22/2002  Job:  [id number]   cc:   Ernestina Penna, M.D.  [address] [place]  [place] [address]  Fax: [phone]

## 2010-09-10 NOTE — Op Note (Signed)
[place]  Patient:    Joseph Atkins, Joseph Atkins Visit Number: [id number] MRN: [id number]          Service Type: DSU Location: [facility] Attending Physician:  Ronne Binning Dictated by:   Nicki Reaper, M.D. Proc. Date: 11/09/01 Admit Date:  11/09/2001 Discharge Date: 11/09/2001                             Operative Report  PREOPERATIVE DIAGNOSIS:  Laceration of extensor tendon, old, right middle finger.  POSTOPERATIVE DIAGNOSIS:  Laceration of extensor tendon, old, right middle finger.  OPERATION:  Repair of extensor tendon, right middle finger.  SURGEON:  Nicki Reaper, M.D.  ASSISTANT:  Joaquin Courts, R.N.  ANESTHESIA:  General.  ANESTHESIOLOGIST:  Janetta Hora. Gelene Mink, M.D.  HISTORY:  The patient is a 50 year old male who suffered a laceration approximately 10 to 14 days ago on the knuckle at the interphalangeal joint of his right middle finger, has had pain and swelling distally with pain with attempted extension.  DESCRIPTION OF PROCEDURE:  The patient was brought to the operating room where a general anesthetic was carried out without difficulty.  He was prepped and draped using DuraPrep.  The limb was exsanguinated with an Esmarch bandage. Tourniquet placed high on the arm was inflated to 250 mmHg.  A zigzag incision was made using the old laceration and carried down through subcutaneous tissue.  The extensor tendon was found to be fully lacerated.  The joint had a small opening; this was cultured, irrigated.  The tendon was then closed with a modified Kessler using 4-0 Mersilene and 4-0 Mersilene in the epitenon. Wound was again irrigated and skin closed with interrupted 5-0 nylon sutures. A sterile compressive dressing and splint were applied.  The patient tolerated the procedure well and was taken to the recovery room for observation in satisfactory condition.  He is discharged home to return to the [facility] [place] in  one week on Vicodin and Septra DS. Dictated by:   Nicki Reaper, M.D. Attending Physician:  Ronne Binning DD:  11/09/01 TD:  11/14/01 Job: [id number]

## 2010-09-10 NOTE — Discharge Summary (Signed)
NAME:  Joseph Atkins, COCUZZA NO.:  [id number]   MEDICAL RECORD NO.:  [id number]          PATIENT TYPE:  IPS   LOCATION:  0507                          FACILITY:  [facility]   PHYSICIAN:  Geoffery Lyons, M.D.      DATE OF BIRTH:  Jun 24, 1960   DATE OF ADMISSION:  08/10/2006  DATE OF DISCHARGE:  08/14/2006                               DISCHARGE SUMMARY   CHIEF COMPLAINT AND PRESENT ILLNESS:  This was the first admission to  [facility] for this 50 year old white male voluntarily  admitted.  Requesting detox.  Sober for 14 years.  Relapsed and  gradually escalation to 8-12 beers on weekends.  Relapse three months  prior to this admission.  First use age 50.  Also history of marijuana  for the last 15 years.   PAST PSYCHIATRIC HISTORY:  First time at the [facility].  One  prior admission to [facility].  Had been on Zoloft in the past.  Prozac did  not work.   ALCOHOL/DRUG HISTORY:  As already stated, persistent use of alcohol.  Also using marijuana.   MEDICAL HISTORY:  Sty in her left eye.   MEDICATIONS:  None.   PHYSICAL EXAMINATION:  Performed and failed to show any acute findings.   LABORATORY DATA:  Not available in the chart.   MENTAL STATUS EXAM:  Fully alert, pleasant, cooperative male.  Speech  normal in rate, tempo and production.  Mood depressed.  Affect blunted.  Thought processes logical, coherent and relevant.  No evidence of  delusions.  No active suicidal or homicidal ideation.  No  hallucinations.  Cognition well-preserved.   ADMISSION DIAGNOSES:  AXIS I:  Alcohol dependence.  Rule out depressive  disorder not otherwise specified.  AXIS II:  No diagnosis.  AXIS III:  Sty in the left eye.  AXIS IV:  Moderate.  AXIS V:  GAF upon admission 30; highest GAF in the last year 60.   HOSPITAL COURSE:  He was admitted and started in individual and group  psychotherapy.  He was given Ambien for sleep.  He was detoxed with  Librium.  Given  trazodone for sleep.  He was also started on Antabuse.  As already stated, endorsed that he was 14 years sober three months ago.  Occasional drinking, then escalated.  Off work, will drink a 22-ounce  beer, on weekends 8-12 beers.  Gets 4-5 hours of sleep.  He continued to  detox.  By August 11, 2006, upset because of the relapse, dealing with  the shame and guilt, depressed mood and affect.  Wife was going to allow  him back.  Said that after years of sobriety, thought that he could have  one beer.  Once he drank the one beer, he could not stop.  Endorsed that  he believes he should have known better.  On August 12, 2006, he was  endorsing some tremors.  He was planning to attend AA.  There was a  family session with his wife.  Apparently, there were some issues in  terms of him not wanting to go back to the  house and her thinking that  he had other women.  There seemed to be a lot of tension in the  relationship.  On August 14, 2006, he was in full contact with reality.  There were no active suicidal or homicidal ideation.  No hallucinations.  No delusions.  He was going to home but he was going to stay with the  father as the environment at the house was not stable enough for him to  be able to maintain sobriety.  Father was going to provide a safe  environment for him with accountability.  We went ahead and discharged  to outpatient follow-up.   DISCHARGE DIAGNOSES:  AXIS I:  Alcohol dependence.  Depressive disorder  not otherwise specified.  AXIS II:  No diagnosis.  AXIS III:  Sty in the left eye.  AXIS IV:  Moderate.  AXIS V:  GAF upon discharge 50-55.   DISCHARGE MEDICATIONS:  1. Antabuse 250 mg per day.  2. Zithromax 250 mg, 1 dose August 15, 2006, then discontinue.   FOLLOWUP:  __________ , [facility].      Geoffery Lyons, M.D.  Electronically Signed     IL/MEDQ  D:  09/13/2006  T:  09/13/2006  Job:  [id number]

## 2011-02-04 LAB — BASIC METABOLIC PANEL
BUN: 8
CO2: 20
Calcium: 9.2
Chloride: 105
Creatinine, Ser: 1.02
GFR calc Af Amer: >60
GFR calc non Af Amer: >60
Glucose, Bld: 94
Potassium: 4
Sodium: 137

## 2011-02-04 LAB — DIFFERENTIAL
Basophils Absolute: 0.1
Basophils Relative: 0
Eosinophils Absolute: 0.3
Eosinophils Relative: 2
Lymphocytes Relative: 22
Lymphs Abs: 2.9
Monocytes Absolute: 0.9 — ABNORMAL HIGH
Monocytes Relative: 7
Neutro Abs: 9.1 — ABNORMAL HIGH
Neutrophils Relative %: 69

## 2011-02-04 LAB — CBC
HCT: 40.8
Hemoglobin: 14.1
MCHC: 34.5
MCV: 90
Platelets: 222
RBC: 4.53
RDW: 13
WBC: 13.2 — ABNORMAL HIGH

## 2012-10-16 ENCOUNTER — Encounter: Payer: Self-pay | Admitting: Gastroenterology

## 2013-01-11 ENCOUNTER — Encounter: Payer: Self-pay | Admitting: Family Medicine

## 2013-01-11 ENCOUNTER — Ambulatory Visit (INDEPENDENT_AMBULATORY_CARE_PROVIDER_SITE_OTHER): Payer: Medicare HMO | Admitting: Family Medicine

## 2013-01-11 VITALS — BP 136/78 | HR 62 | Temp 97.0°F | Ht 68.0 in | Wt 175.8 lb

## 2013-01-11 DIAGNOSIS — K219 Gastro-esophageal reflux disease without esophagitis: Secondary | ICD-10-CM

## 2013-01-11 DIAGNOSIS — K5732 Diverticulitis of large intestine without perforation or abscess without bleeding: Secondary | ICD-10-CM

## 2013-01-11 MED ORDER — OMEPRAZOLE 40 MG PO CPDR: 40.0000 mg | DELAYED_RELEASE_CAPSULE | Freq: Every day | ORAL | Status: DC

## 2013-01-11 MED ORDER — CIPROFLOXACIN HCL 500 MG PO TABS: 500.0000 mg | ORAL_TABLET | Freq: Two times a day (BID) | ORAL | Status: DC

## 2013-01-11 MED ORDER — METRONIDAZOLE 500 MG PO TABS: 500.0000 mg | ORAL_TABLET | Freq: Three times a day (TID) | ORAL | Status: DC

## 2013-01-11 NOTE — Patient Instructions (Addendum)
Diverticulitis °A diverticulum is a small pouch or sac on the colon. Diverticulosis is the presence of these diverticula on the colon. Diverticulitis is the irritation (inflammation) or infection of diverticula. °CAUSES  °The colon and its diverticula contain bacteria. If food particles block the tiny opening to a diverticulum, the bacteria inside can grow and cause an increase in pressure. This leads to infection and inflammation and is called diverticulitis. °SYMPTOMS  °· Abdominal pain and tenderness. Usually, the pain is located on the left side of your abdomen. However, it could be located elsewhere. °· Fever. °· Bloating. °· Feeling sick to your stomach (nausea). °· Throwing up (vomiting). °· Abnormal stools. °DIAGNOSIS  °Your caregiver will take a history and perform a physical exam. Since many things can cause abdominal pain, other tests may be necessary. Tests may include: °· Blood tests. °· Urine tests. °· X-ray of the abdomen. °· CT scan of the abdomen. °Sometimes, surgery is needed to determine if diverticulitis or other conditions are causing your symptoms. °TREATMENT  °Most of the time, you can be treated without surgery. Treatment includes: °· Resting the bowels by only having liquids for a few days. As you improve, you will need to eat a low-fiber diet. °· Intravenous (IV) fluids if you are losing body fluids (dehydrated). °· Antibiotic medicines that treat infections may be given. °· Pain and nausea medicine, if needed. °· Surgery if the inflamed diverticulum has burst. °HOME CARE INSTRUCTIONS  °· Try a clear liquid diet (broth, tea, or water for as long as directed by your caregiver). You may then gradually begin a low-fiber diet as tolerated.  °A low-fiber diet is a diet with less than 10 grams of fiber. Choose the foods below to reduce fiber in the diet: °· White breads, cereals, rice, and pasta. °· Cooked fruits and vegetables or soft fresh fruits and vegetables without the skin. °· Ground or  well-cooked tender beef, ham, veal, lamb, pork, or poultry. °· Eggs and seafood. °· After your diverticulitis symptoms have improved, your caregiver may put you on a high-fiber diet. A high-fiber diet includes 14 grams of fiber for every 1000 calories consumed. For a standard 2000 calorie diet, you would need 28 grams of fiber. Follow these diet guidelines to help you increase the fiber in your diet. It is important to slowly increase the amount fiber in your diet to avoid gas, constipation, and bloating. °· Choose whole-grain breads, cereals, pasta, and brown rice. °· Choose fresh fruits and vegetables with the skin on. Do not overcook vegetables because the more vegetables are cooked, the more fiber is lost. °· Choose more nuts, seeds, legumes, dried peas, beans, and lentils. °· Look for food products that have greater than 3 grams of fiber per serving on the Nutrition Facts label. °· Take all medicine as directed by your caregiver. °· If your caregiver has given you a follow-up appointment, it is very important that you go. Not going could result in lasting (chronic) or permanent injury, pain, and disability. If there is any problem keeping the appointment, call to reschedule. °SEEK MEDICAL CARE IF:  °· Your pain does not improve. °· You have a hard time advancing your diet beyond clear liquids. °· Your bowel movements do not return to normal. °SEEK IMMEDIATE MEDICAL CARE IF:  °· Your pain becomes worse. °· You have an oral temperature above 102° F (38.9° C), not controlled by medicine. °· You have repeated vomiting. °· You have bloody or black, tarry stools. °·   Symptoms that brought you to your caregiver become worse or are not getting better. °MAKE SURE YOU:  °· Understand these instructions. °· Will watch your condition. °· Will get help right away if you are not doing well or get worse. °Document Released: 01/19/2005 Document Revised: 07/04/2011 Document Reviewed: 05/17/2010 °[employer]® Patient Information  ©2014 [employer]. ° °

## 2013-01-11 NOTE — Progress Notes (Signed)
  Subjective:    Patient ID: Joseph Atkins, male    DOB: 06/13/1960, 52 y.o.   MRN: [id number]  HPI This 52 y.o. male presents for evaluation of left lower abdominal pain.  He has been having some constipation and he has been having LLQ abdominal pain.   Review of Systems C/o abdominal pain   No chest pain, SOB, HA, dizziness, vision change, N/V, diarrhea, constipation, dysuria, urinary urgency or frequency, myalgias, arthralgias or rash.  Objective:   Physical Exam Vital signs noted  Well developed well nourished male.  HEENT - Head atraumatic Normocephalic                Eyes - PERRLA, Conjuctiva - clear Sclera- Clear EOMI                Ears - EAC's Wnl TM's Wnl Gross Hearing WNL                Nose - Nares patent                 Throat - oropharanx wnl Respiratory - Lungs CTA bilateral Cardiac - RRR S1 and S2 without murmur GI - Abdomen soft with tenderness LLQ BS x 4 active Rectal - normal rectal tone and prostate soft and no masses not enlarged. Extremities - No edema. Neuro - Grossly intact.       Assessment & Plan:  Diverticulitis of colon (without mention of hemorrhage) - Plan: ciprofloxacin (CIPRO) 500 MG tablet, metroNIDAZOLE (FLAGYL) 500 MG tablet.  Discussed avoids seeds and nuts. Follow up if sx's worsen or continue  GERD (gastroesophageal reflux disease) - Plan: omeprazole (PRILOSEC) 40 MG capsule  Follow up for CPE in a month

## 2013-02-11 ENCOUNTER — Ambulatory Visit (INDEPENDENT_AMBULATORY_CARE_PROVIDER_SITE_OTHER): Payer: Medicare HMO

## 2013-02-11 ENCOUNTER — Encounter: Payer: Self-pay | Admitting: Family Medicine

## 2013-02-11 ENCOUNTER — Ambulatory Visit (INDEPENDENT_AMBULATORY_CARE_PROVIDER_SITE_OTHER): Payer: Medicare HMO | Admitting: Family Medicine

## 2013-02-11 VITALS — BP 121/72 | HR 66 | Temp 97.6°F | Ht 67.5 in | Wt 174.4 lb

## 2013-02-11 DIAGNOSIS — F17200 Nicotine dependence, unspecified, uncomplicated: Secondary | ICD-10-CM

## 2013-02-11 DIAGNOSIS — R5381 Other malaise: Secondary | ICD-10-CM

## 2013-02-11 DIAGNOSIS — Z0000 Encounter for general adult medical examination without abnormal findings: Secondary | ICD-10-CM

## 2013-02-11 DIAGNOSIS — K5900 Constipation, unspecified: Secondary | ICD-10-CM

## 2013-02-11 DIAGNOSIS — I739 Peripheral vascular disease, unspecified: Secondary | ICD-10-CM

## 2013-02-11 DIAGNOSIS — R5383 Other fatigue: Secondary | ICD-10-CM

## 2013-02-11 DIAGNOSIS — Z720 Tobacco use: Secondary | ICD-10-CM

## 2013-02-11 DIAGNOSIS — R1032 Left lower quadrant pain: Secondary | ICD-10-CM

## 2013-02-11 LAB — POCT CBC
Granulocyte percent: 68 %G (ref 37–80)
HCT, POC: 45.5 % (ref 43.5–53.7)
Hemoglobin: 15 g/dL (ref 14.1–18.1)
Lymph, poc: 2.8 (ref 0.6–3.4)
MCH, POC: 29.6 pg (ref 27–31.2)
MCHC: 33 g/dL (ref 31.8–35.4)
MCV: 89.7 fL (ref 80–97)
MPV: 7.8 fL (ref 0–99.8)
POC Granulocyte: 7.5 — AB (ref 2–6.9)
POC LYMPH PERCENT: 25.5 %L (ref 10–50)
Platelet Count, POC: 238 10*3/uL (ref 142–424)
RBC: 5.1 M/uL (ref 4.69–6.13)
RDW, POC: 13.2 %
WBC: 11.1 10*3/uL — AB (ref 4.6–10.2)

## 2013-02-11 IMAGING — CR DG CHEST 2V
2 series · 2 of 2 positions shown · non-contrast
Comparison: Chest CT - [DATE]

CLINICAL DATA: History of smoking

CHEST - 2 VIEW

[view not recorded (1 of 2)]
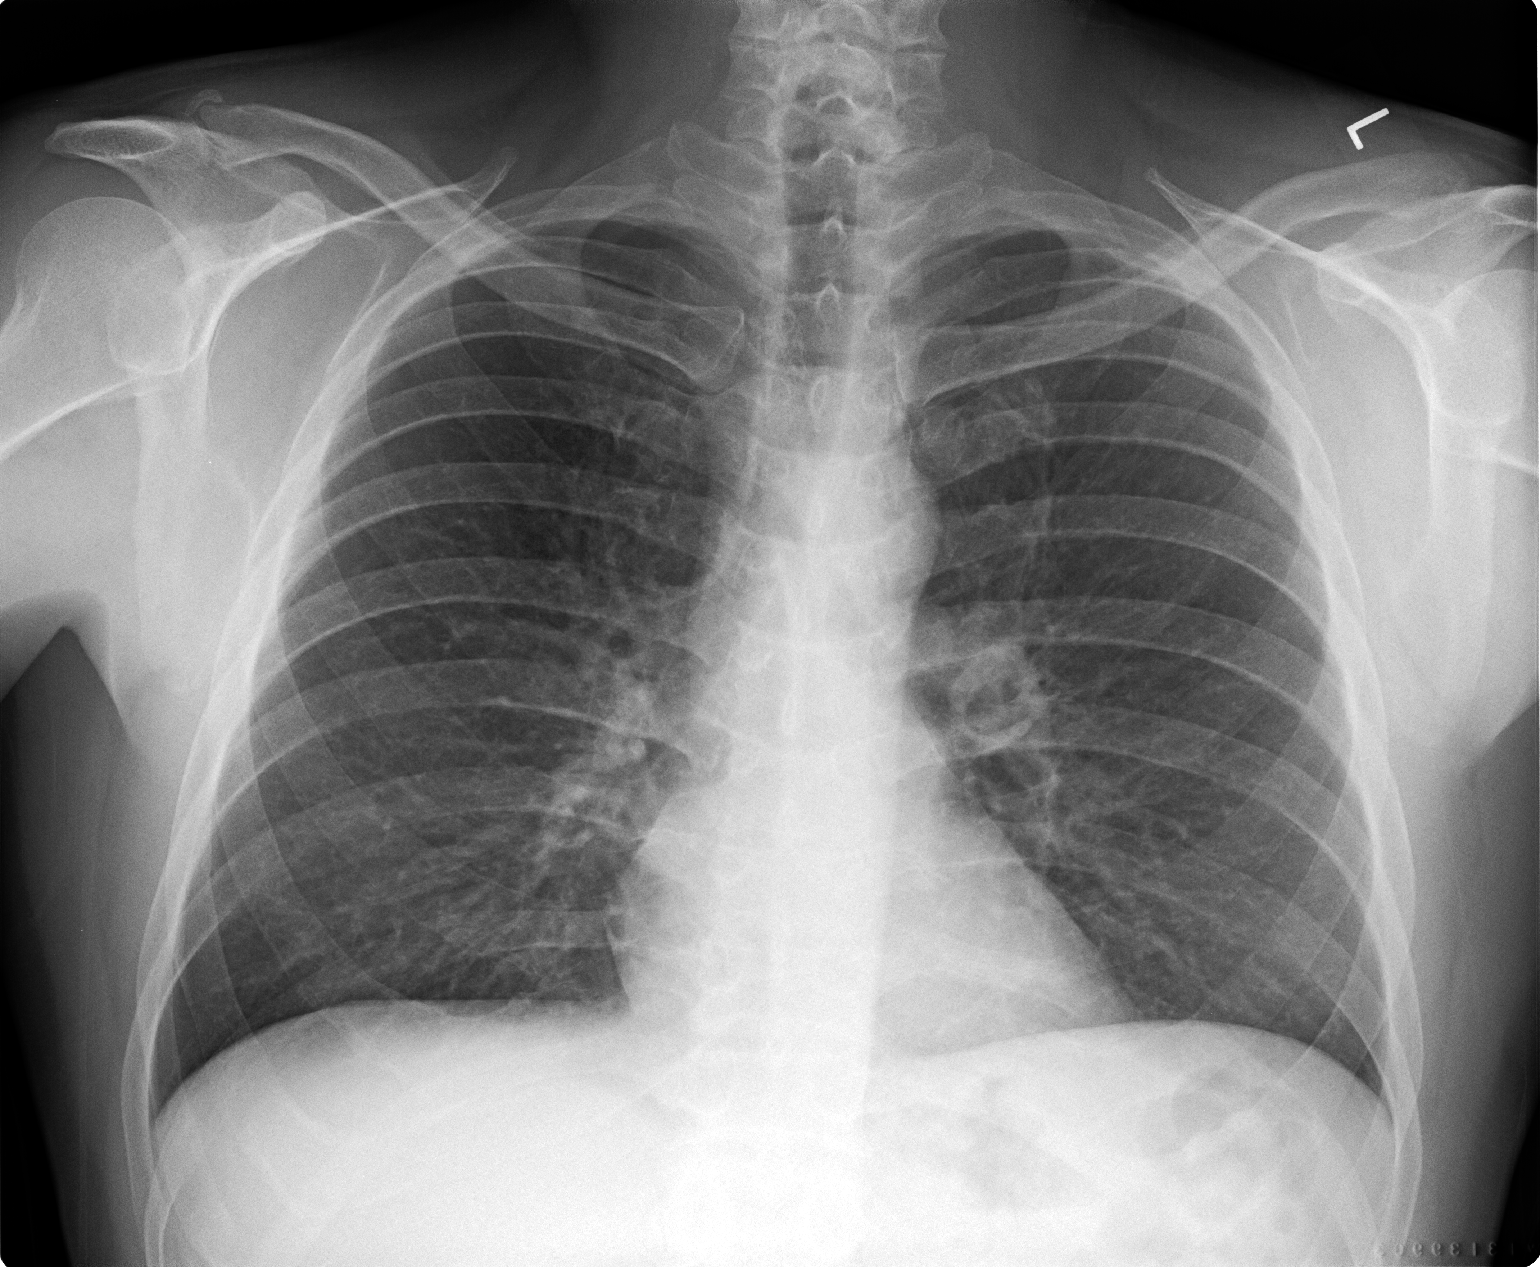

[view not recorded (2 of 2)]
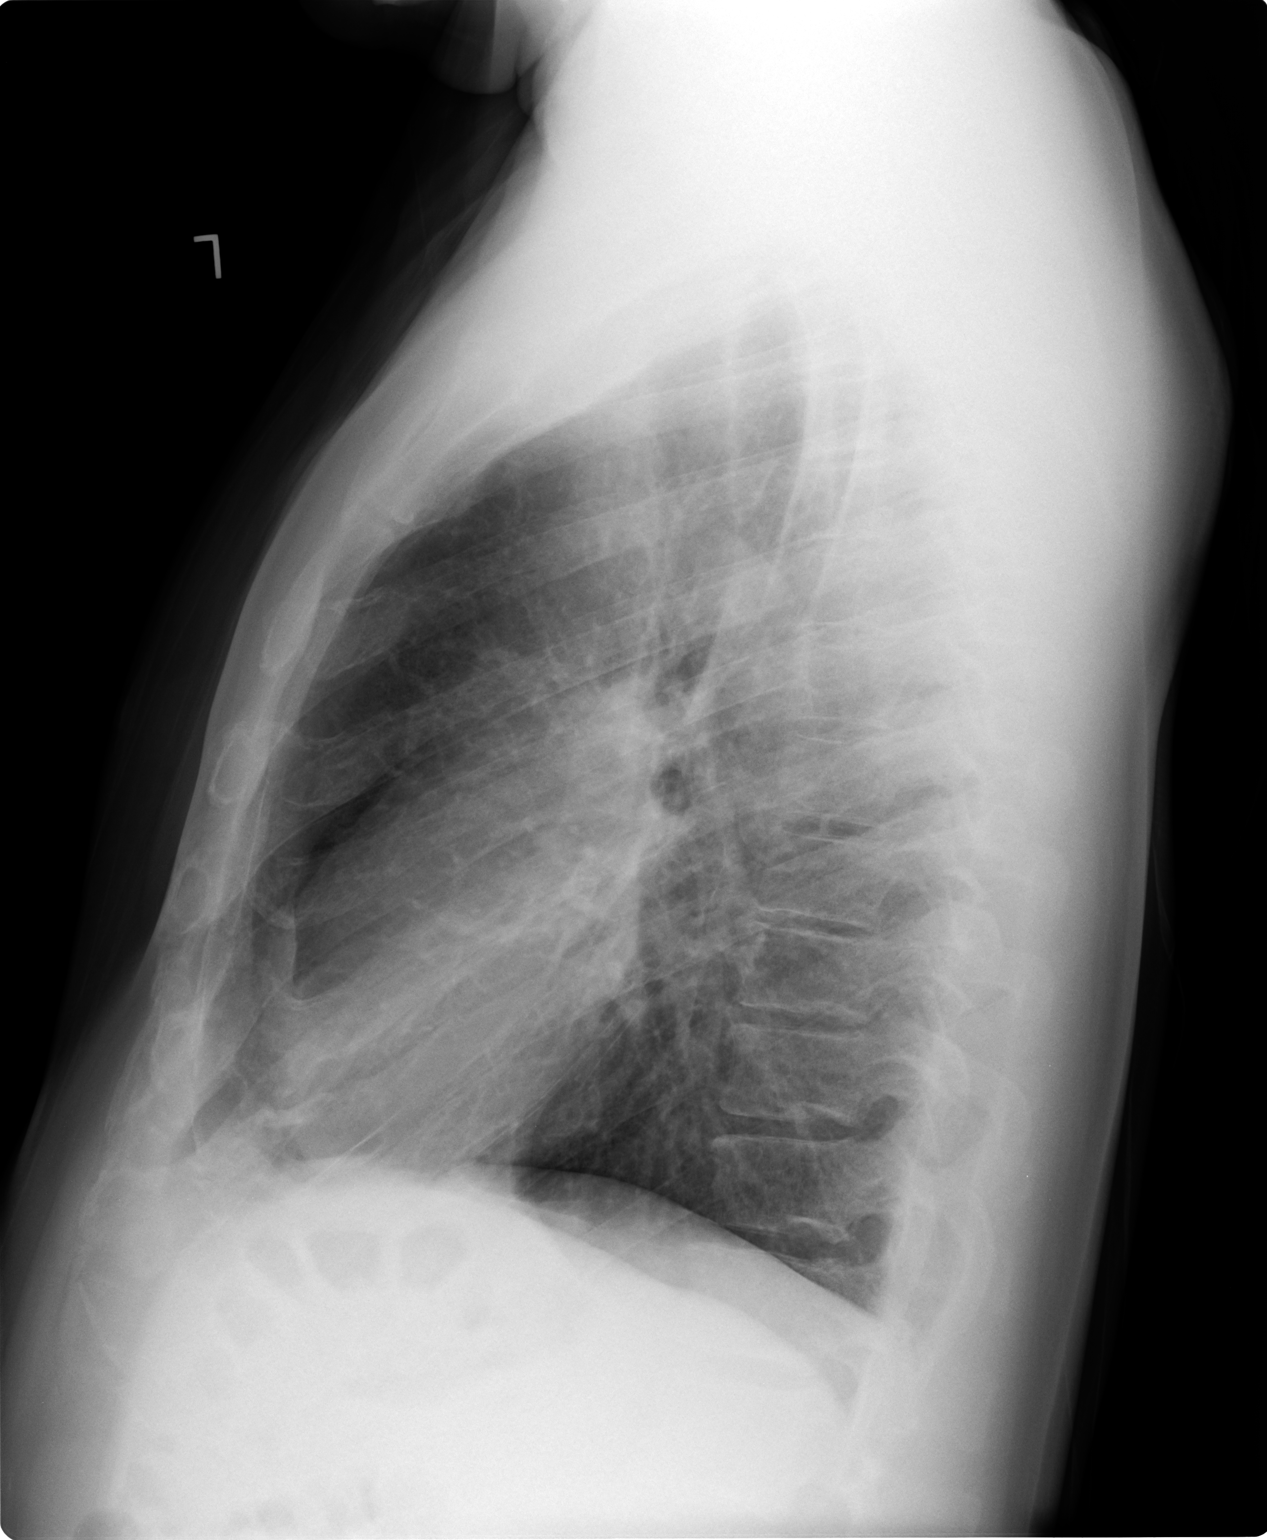

[2 of 2 positions shown; findings below may reference images not displayed]

FINDINGS: Grossly unchanged cardiac silhouette and mediastinal
contours.  The lungs appear mildly hyperexpanded with flattening of
the bilateral hemidiaphragms and mild diffuse slightly nodular
thickening of the pulmonary interstitium.  No focal airspace
opacities.  No pleural effusion or pneumothorax.  No acute osseous
abnormalities.] Possible post-traumatic deformity of the right AC
joint.
IMPRESSION: Mild lung hyperexpansion and bronchitic change without acute
cardiopulmonary disease.

Clinically significant discrepancy from primary report, if
provided: None

## 2013-02-11 MED ORDER — LUBIPROSTONE 24 MCG PO CAPS: 24.0000 ug | ORAL_CAPSULE | Freq: Two times a day (BID) | ORAL | Status: DC

## 2013-02-11 NOTE — Progress Notes (Signed)
  Subjective:    Patient ID: Joseph Atkins, male    DOB: Jun 10, 1960, 52 y.o.   MRN: [id number]  HPI This 52 y.o. male presents for evaluation of CPE.  He has been having some fatigue.  He is a smoker since The age of 39 and smokes 1ppd. He has been having problems with constipation and LLQ abdominal discomfort. At last visit a month ago he was tx for suspected divertiulitis.  He has not had a colonoscopy. He has family hx  Of CAD, his mother died from an MI.  He has been having persistent back pain and sciatica and states he gets Pain in his legs if he has to walk any distance.  He states he gets sciatic type pain when he walks.  He denies any Loud snoring or RLS sx's but c/o occasional leg cramps.   Review of Systems C/o fatigue, cramps, back pain, sciatica, and claudication sx's. No chest pain, SOB, HA, dizziness, vision change, N/V, diarrhea, constipation, dysuria, urinary urgency  or rash.     Objective:   Physical Exam Vital signs noted  Well developed well nourished male.  HEENT - Head atraumatic Normocephalic                Eyes - PERRLA, Conjuctiva - clear Sclera- Clear EOMI                Ears - EAC's Wnl TM's Wnl Gross Hearing WNL                Nose - Nares patent                 Throat - oropharanx wnl Respiratory - Lungs CTA bilateral Cardiac - RRR S1 and S2 without murmur GI - Abdomen soft Nontender and bowel sounds active x 4 Rectal - Declined. Extremities - No edema. Neuro - Grossly intact. Feet - Bilateral feet with 2 plus DP and PT pulses and capillary refill less than 2 seconds.  cxr - no infiltrates Prelimnary reading by Angeline Slim    Assessment & Plan:  Routine general medical examination at a health care facility - Plan: POCT CBC, CMP14+EGFR, Lipid panel, PSA, total and free, Thyroid Panel With TSH  Unspecified constipation - Plan: lubiprostone (AMITIZA) 24 MCG capsule  Abdominal pain, left lower quadrant - Plan: Ambulatory referral to  Gastroenterology, lubiprostone (AMITIZA) 24 MCG capsule  Other malaise and fatigue - Plan: Testosterone,Free and Total.  Discussed getting a referral to Cardiology for stress test and wants to wait.  Recommend ASA 81mg  po qd otc.  Tobacco abuse - Plan: DG Chest 2 View  Discussed at length need to quit smoking.  Claudication - Discussed work up for DDD of the LS spine and he wants to wait.  Discussed that he has good pulses and doubt this is vascular in etiology.  Leg cramps - Try otc tonic water at hs.  Deatra Canter FNP

## 2013-02-11 NOTE — Patient Instructions (Signed)
Back Pain, Adult  Low back pain is very common. About 1 in 5 people have back pain. The cause of low back pain is rarely dangerous. The pain often gets better over time. About half of people with a sudden onset of back pain feel better in just 2 weeks. About 8 in 10 people feel better by 6 weeks.   CAUSES  Some common causes of back pain include:  · Strain of the muscles or ligaments supporting the spine.  · Wear and tear (degeneration) of the spinal discs.  · Arthritis.  · Direct injury to the back.  DIAGNOSIS  Most of the time, the direct cause of low back pain is not known. However, back pain can be treated effectively even when the exact cause of the pain is unknown. Answering your caregiver's questions about your overall health and symptoms is one of the most accurate ways to make sure the cause of your pain is not dangerous. If your caregiver needs more information, he or she may order lab work or imaging tests (X-rays or MRIs). However, even if imaging tests show changes in your back, this usually does not require surgery.  HOME CARE INSTRUCTIONS  For many people, back pain returns. Since low back pain is rarely dangerous, it is often a condition that people can learn to manage on their own.   · Remain active. It is stressful on the back to sit or stand in one place. Do not sit, drive, or stand in one place for more than 30 minutes at a time. Take short walks on level surfaces as soon as pain allows. Try to increase the length of time you walk each day.  · Do not stay in bed. Resting more than 1 or 2 days can delay your recovery.  · Do not avoid exercise or work. Your body is made to move. It is not dangerous to be active, even though your back may hurt. Your back will likely heal faster if you return to being active before your pain is gone.  · Pay attention to your body when you  bend and lift. Many people have less discomfort when lifting if they bend their knees, keep the load close to their bodies, and  avoid twisting. Often, the most comfortable positions are those that put less stress on your recovering back.  · Find a comfortable position to sleep. Use a firm mattress and lie on your side with your knees slightly bent. If you lie on your back, put a pillow under your knees.  · Only take over-the-counter or prescription medicines as directed by your caregiver. Over-the-counter medicines to reduce pain and inflammation are often the most helpful. Your caregiver may prescribe muscle relaxant drugs. These medicines help dull your pain so you can more quickly return to your normal activities and healthy exercise.  · Put ice on the injured area.  · Put ice in a plastic bag.  · Place a towel between your skin and the bag.  · Leave the ice on for 15-20 minutes, 3-4 times a day for the first 2 to 3 days. After that, ice and heat may be alternated to reduce pain and spasms.  · Ask your caregiver about trying back exercises and gentle massage. This may be of some benefit.  · Avoid feeling anxious or stressed. Stress increases muscle tension and can worsen back pain. It is important to recognize when you are anxious or stressed and learn ways to manage it. Exercise is a great option.  SEEK MEDICAL CARE IF:  · You have pain that is not relieved with rest or   medicine.  · You have pain that does not improve in 1 week.  · You have new symptoms.  · You are generally not feeling well.  SEEK IMMEDIATE MEDICAL CARE IF:   · You have pain that radiates from your back into your legs.  · You develop new bowel or bladder control problems.  · You have unusual weakness or numbness in your arms or legs.  · You develop nausea or vomiting.  · You develop abdominal pain.  · You feel faint.  Document Released: 04/11/2005 Document Revised: 10/11/2011 Document Reviewed: 08/30/2010  [employer]® Patient Information ©2014 [employer].

## 2013-02-12 LAB — CMP14+EGFR
ALT: 20 IU/L (ref 0–44)
AST: 19 IU/L (ref 0–40)
Albumin/Globulin Ratio: 1.9 (ref 1.1–2.5)
Albumin: 4.9 g/dL (ref 3.5–5.5)
Alkaline Phosphatase: 72 IU/L (ref 39–117)
BUN/Creatinine Ratio: 10 (ref 9–20)
BUN: 9 mg/dL (ref 6–24)
CO2: 25 mmol/L (ref 18–29)
Calcium: 9.6 mg/dL (ref 8.7–10.2)
Chloride: 100 mmol/L (ref 97–108)
Creatinine, Ser: 0.88 mg/dL (ref 0.76–1.27)
GFR calc Af Amer: 115 mL/min/{1.73_m2} (ref >59)
GFR calc non Af Amer: 99 mL/min/{1.73_m2} (ref >59)
Globulin, Total: 2.6 g/dL (ref 1.5–4.5)
Glucose: 95 mg/dL (ref 65–99)
Potassium: 5.3 mmol/L — ABNORMAL HIGH (ref 3.5–5.2)
Sodium: 143 mmol/L (ref 134–144)
Total Bilirubin: 0.3 mg/dL (ref 0.0–1.2)
Total Protein: 7.5 g/dL (ref 6.0–8.5)

## 2013-02-12 LAB — LIPID PANEL
Chol/HDL Ratio: 6.7 ratio units — ABNORMAL HIGH (ref 0.0–5.0)
Cholesterol, Total: 228 mg/dL — ABNORMAL HIGH (ref 100–199)
HDL: 34 mg/dL — ABNORMAL LOW (ref >39)
LDL Calculated: 150 mg/dL — ABNORMAL HIGH (ref 0–99)
Triglycerides: 218 mg/dL — ABNORMAL HIGH (ref 0–149)
VLDL Cholesterol Cal: 44 mg/dL — ABNORMAL HIGH (ref 5–40)

## 2013-02-12 LAB — TESTOSTERONE,FREE AND TOTAL
Testosterone, Free: 6.1 pg/mL — ABNORMAL LOW (ref 7.2–24.0)
Testosterone: 404 ng/dL (ref 348–1197)

## 2013-02-12 LAB — THYROID PANEL WITH TSH
Free Thyroxine Index: 2.1 (ref 1.2–4.9)
T3 Uptake Ratio: 22 % — ABNORMAL LOW (ref 24–39)
T4, Total: 9.5 ug/dL (ref 4.5–12.0)
TSH: 6.29 u[IU]/mL — ABNORMAL HIGH (ref 0.450–4.500)

## 2013-02-12 LAB — PSA, TOTAL AND FREE
PSA, Free Pct: 21.7 %
PSA, Free: 0.13 ng/mL
PSA: 0.6 ng/mL (ref 0.0–4.0)

## 2013-02-13 ENCOUNTER — Other Ambulatory Visit: Payer: Self-pay | Admitting: Family Medicine

## 2013-02-13 DIAGNOSIS — E039 Hypothyroidism, unspecified: Secondary | ICD-10-CM

## 2013-02-13 DIAGNOSIS — E785 Hyperlipidemia, unspecified: Secondary | ICD-10-CM

## 2013-02-13 MED ORDER — FLUVASTATIN SODIUM 40 MG PO CAPS: 40.0000 mg | ORAL_CAPSULE | Freq: Every day | ORAL | Status: DC

## 2013-02-13 MED ORDER — LEVOTHYROXINE SODIUM 25 MCG PO TABS: 25.0000 ug | ORAL_TABLET | Freq: Every day | ORAL | Status: DC

## 2013-02-20 ENCOUNTER — Encounter: Payer: Self-pay | Admitting: Gastroenterology

## 2013-03-07 ENCOUNTER — Ambulatory Visit (INDEPENDENT_AMBULATORY_CARE_PROVIDER_SITE_OTHER): Payer: Managed Care, Other (non HMO) | Admitting: Gastroenterology

## 2013-03-07 ENCOUNTER — Encounter: Payer: Self-pay | Admitting: Gastroenterology

## 2013-03-07 VITALS — BP 136/82 | HR 72 | Ht 67.25 in | Wt 181.2 lb

## 2013-03-07 DIAGNOSIS — K5900 Constipation, unspecified: Secondary | ICD-10-CM | POA: Insufficient documentation

## 2013-03-07 MED ORDER — NA SULFATE-K SULFATE-MG SULF 17.5-3.13-1.6 GM/177ML PO SOLN: 1.0000 | Freq: Once | ORAL | Status: AC

## 2013-03-07 NOTE — Assessment & Plan Note (Addendum)
Patient has chronic idiopathic constipation.  Complaints of pain and bloating are probably related to this.  Recommendations #1 colonoscopy #2 patient will consider enrollment in a CIC trial

## 2013-03-07 NOTE — Progress Notes (Signed)
History of Present Illness: 52 year old white male referred for evaluation of constipation.  This has been a long-standing problem.  He may go 2-3 days without a bowel movement without the help of laxatives.  With daily MiraLax he moves his bowels more regularly.  With constipation he complains of postprandial bloating and left lower quadrant pain.  Pain is described as pressure.  He denies melena or hematochezia.  Colonoscopy in 2004 demonstrated diverticulosis and a hyperplastic polyp.    Past Medical History  Diagnosis Date  . Diverticulosis   . Substance abuse     Alcoholic, Drug addition  . IBS (irritable bowel syndrome)   . Hx of small bowel obstruction    Past Surgical History  Procedure Laterality Date  . Arm surgery Right   . Finger surgery Right     Middle   family history includes Epilepsy in his mother; Heart disease in his brother and mother; Lung cancer in his paternal uncle. Current Outpatient Prescriptions  Medication Sig Dispense Refill  . fluvastatin (LESCOL) 40 MG capsule Take 1 capsule (40 mg total) by mouth at bedtime.  30 capsule  11  . levothyroxine (LEVOTHROID) 25 MCG tablet Take 1 tablet (25 mcg total) by mouth daily before breakfast.  30 tablet  11  . omeprazole (PRILOSEC) 40 MG capsule Take 1 capsule (40 mg total) by mouth daily.  30 capsule  11  . polyethylene glycol powder (MIRALAX) powder Take 17 g by mouth daily.       No current facility-administered medications for this visit.   Allergies as of 03/07/2013 - Review Complete 03/07/2013  Allergen Reaction Noted  . Penicillins  01/11/2013    reports that he has been smoking Cigarettes.  He has been smoking about 0.00 packs per day. His smokeless tobacco use includes Chew. He reports that he does not drink alcohol or use illicit drugs.     Review of Systems: Pertinent positive and negative review of systems were noted in the above HPI section. All other review of systems were otherwise  negative.  Vital signs were reviewed in today's medical record Physical Exam: General: Well developed , well nourished, no acute distress Skin: anicteric Head: Normocephalic and atraumatic Eyes:  sclerae anicteric, EOMI Ears: Normal auditory acuity Mouth: No deformity or lesions Neck: Supple, no masses or thyromegaly Lungs: Clear throughout to auscultation Heart: Regular rate and rhythm; no murmurs, rubs or bruits Abdomen: Soft,and non distended. No masses, hepatosplenomegaly or hernias noted. Normal Bowel sounds.  There is mild tenderness to palpation in the left lower quadrant Rectal:deferred Musculoskeletal: Symmetrical with no gross deformities  Skin: No lesions on visible extremities Pulses:  Normal pulses noted Extremities: No clubbing, cyanosis, edema or deformities noted Neurological: Alert oriented x 4, grossly nonfocal Cervical Nodes:  No significant cervical adenopathy Inguinal Nodes: No significant inguinal adenopathy Psychological:  Alert and cooperative. Normal mood and affect

## 2013-03-07 NOTE — Patient Instructions (Signed)
You have been scheduled for a colonoscopy with propofol. Please follow written instructions given to you at your visit today.  Please pick up your prep kit at the pharmacy within the next 1-3 days. If you use inhalers (even only as needed), please bring them with you on the day of your procedure. Your physician has requested that you go to www.[email or web address].[email or web address] and enter the access code given to you at your visit today. This web site gives a general overview about your procedure. However, you should still follow specific instructions given to you by our office regarding your preparation for the procedure.  

## 2013-03-14 ENCOUNTER — Encounter: Payer: Self-pay | Admitting: Gastroenterology

## 2013-05-14 ENCOUNTER — Encounter: Payer: Managed Care, Other (non HMO) | Admitting: Gastroenterology

## 2014-05-09 ENCOUNTER — Encounter (INDEPENDENT_AMBULATORY_CARE_PROVIDER_SITE_OTHER): Payer: Self-pay

## 2014-05-09 ENCOUNTER — Ambulatory Visit (INDEPENDENT_AMBULATORY_CARE_PROVIDER_SITE_OTHER): Payer: PRIVATE HEALTH INSURANCE | Admitting: Family Medicine

## 2014-05-09 VITALS — BP 143/85 | HR 82 | Temp 96.5°F | Ht 67.25 in | Wt 177.8 lb

## 2014-05-09 DIAGNOSIS — M542 Cervicalgia: Secondary | ICD-10-CM

## 2014-05-09 DIAGNOSIS — E785 Hyperlipidemia, unspecified: Secondary | ICD-10-CM

## 2014-05-09 DIAGNOSIS — K2100 Gastro-esophageal reflux disease with esophagitis, without bleeding: Secondary | ICD-10-CM

## 2014-05-09 DIAGNOSIS — E038 Other specified hypothyroidism: Secondary | ICD-10-CM

## 2014-05-09 DIAGNOSIS — K5909 Other constipation: Secondary | ICD-10-CM

## 2014-05-09 MED ORDER — POLYETHYLENE GLYCOL 3350 17 GM/SCOOP PO POWD: 17.0000 g | Freq: Every day | ORAL | Status: DC

## 2014-05-09 MED ORDER — FLUVASTATIN SODIUM 40 MG PO CAPS: 40.0000 mg | ORAL_CAPSULE | Freq: Every day | ORAL | Status: DC

## 2014-05-09 MED ORDER — METHYLPREDNISOLONE (PAK) 4 MG PO TABS: ORAL_TABLET | ORAL | Status: DC

## 2014-05-09 MED ORDER — OMEPRAZOLE 40 MG PO CPDR: 40.0000 mg | DELAYED_RELEASE_CAPSULE | Freq: Every day | ORAL | Status: DC

## 2014-05-09 MED ORDER — LEVOTHYROXINE SODIUM 25 MCG PO TABS: 25.0000 ug | ORAL_TABLET | Freq: Every day | ORAL | Status: DC

## 2014-05-09 MED ORDER — HYDROCODONE-ACETAMINOPHEN 5-325 MG PO TABS: 1.0000 | ORAL_TABLET | Freq: Four times a day (QID) | ORAL | Status: DC | PRN

## 2014-05-09 NOTE — Progress Notes (Signed)
   Subjective:    Patient ID: Joseph Atkins, male    DOB: [birth year], 54 y.o.   MRN: [id number]  HPI Patient c/o severe pain in his right neck.  He has difficulty sleeping and his pain level is severe.  He c/o needing refills on meds which he has been out of.  Review of Systems  Constitutional: Negative for fever.  HENT: Negative for ear pain.   Eyes: Negative for discharge.  Respiratory: Negative for cough.   Cardiovascular: Negative for chest pain.  Gastrointestinal: Negative for abdominal distention.  Endocrine: Negative for polyuria.  Genitourinary: Negative for difficulty urinating.  Musculoskeletal: Negative for gait problem and neck pain.  Skin: Negative for color change and rash.  Neurological: Negative for speech difficulty and headaches.  Psychiatric/Behavioral: Negative for agitation.       Objective:    BP 143/85 mmHg  Pulse 82  Temp(Src) 96.5 F (35.8 C) (Oral)  Ht 5' 7.25" (1.708 m)  Wt 177 lb 12.8 oz (80.65 kg)  BMI 27.65 kg/m2 Physical Exam  Constitutional: He is oriented to person, place, and time. He appears well-developed and well-nourished.  HENT:  Head: Normocephalic and atraumatic.  Mouth/Throat: Oropharynx is clear and moist.  Eyes: Pupils are equal, round, and reactive to light.  Neck: Normal range of motion. Neck supple.  Cardiovascular: Normal rate and regular rhythm.   No murmur heard. Pulmonary/Chest: Effort normal and breath sounds normal.  Abdominal: Soft. Bowel sounds are normal. There is no tenderness.  Musculoskeletal:  TTP right cervical paraspinous muscles  Neurological: He is alert and oriented to person, place, and time.  Skin: Skin is warm and dry.  Psychiatric: He has a normal mood and affect.          Assessment & Plan:     ICD-9-CM ICD-10-CM   1. Other specified hypothyroidism 244.8 E03.8 levothyroxine (LEVOTHROID) 25 MCG tablet  2. Gastroesophageal reflux disease with esophagitis 530.11 K21.0 omeprazole (PRILOSEC)  40 MG capsule  3. Hyperlipidemia 272.4 E78.5 fluvastatin (LESCOL) 40 MG capsule  4. Cervicalgia 723.1 M54.2 HYDROcodone-acetaminophen (NORCO) 5-325 MG per tablet     methylPREDNIsolone (MEDROL DOSPACK) 4 MG tablet  5. Other constipation 564.09 K59.09 polyethylene glycol powder (MIRALAX) powder   Follow up in 3 months for labs  No Follow-up on file.  Lysbeth Penner FNP

## 2014-05-12 ENCOUNTER — Telehealth: Payer: Self-pay | Admitting: Family Medicine

## 2014-05-12 NOTE — Telephone Encounter (Signed)
lescol too expensive Can you change to Simvastatin

## 2014-05-13 ENCOUNTER — Other Ambulatory Visit: Payer: Self-pay | Admitting: Family Medicine

## 2014-05-13 MED ORDER — SIMVASTATIN 10 MG PO TABS: 10.0000 mg | ORAL_TABLET | Freq: Every day | ORAL | Status: DC

## 2014-05-13 NOTE — Telephone Encounter (Signed)
Simvastatin called into pharmacy

## 2014-06-11 IMAGING — CR DG FOOT COMPLETE 3+V*R*
3 series · 3 of 3 positions shown · non-contrast
Comparison: None.

CLINICAL DATA: Right foot trauma.  Initial evaluation.

EXAM:
RIGHT FOOT COMPLETE - 3+ VIEW

[view not recorded (1 of 3)]
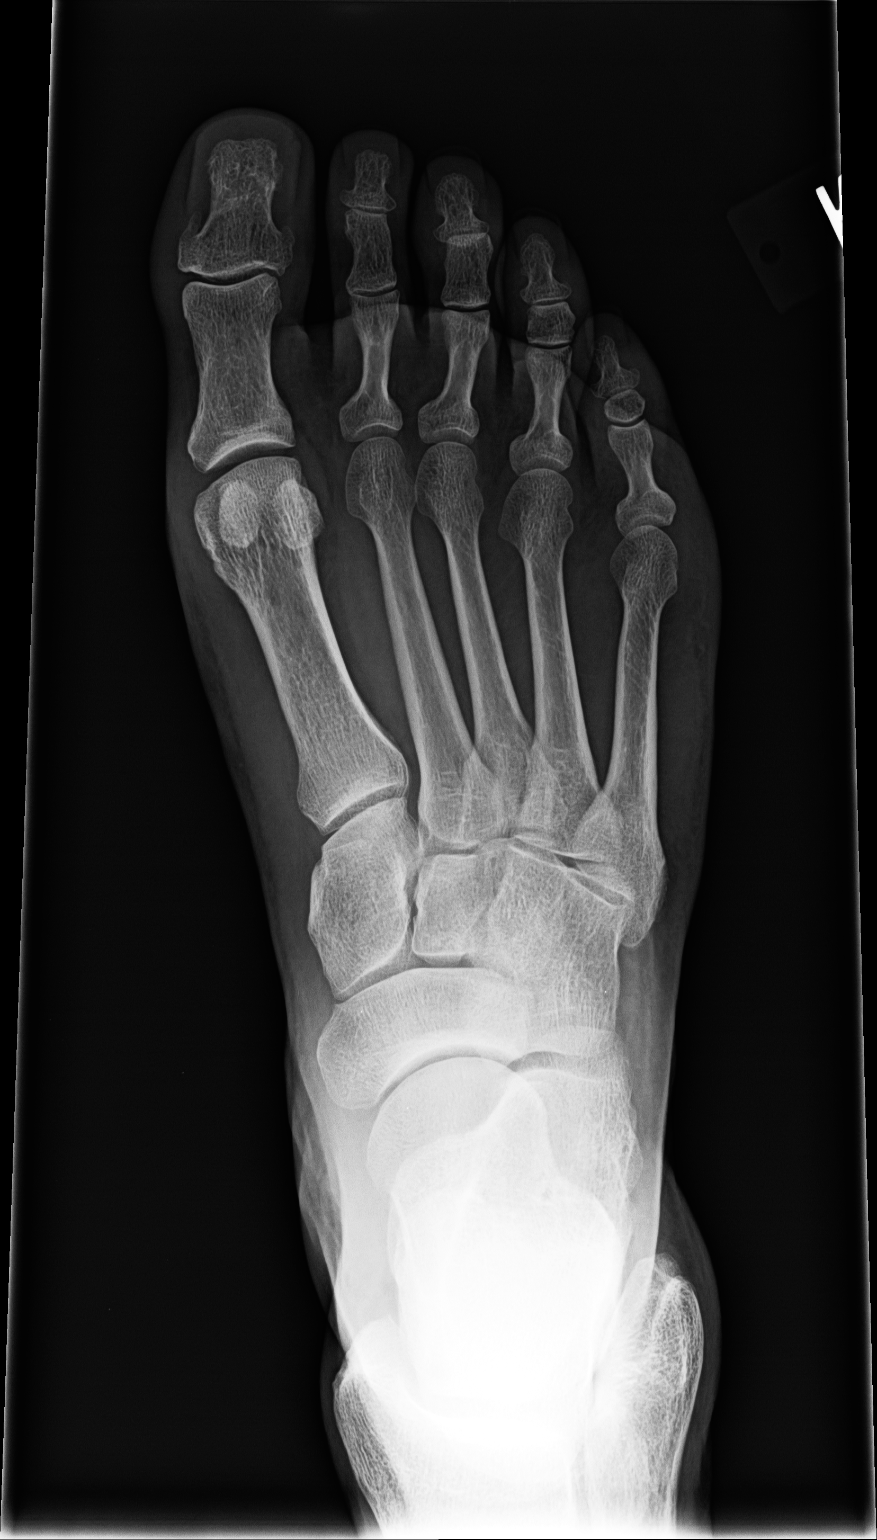

[view not recorded (2 of 3)]
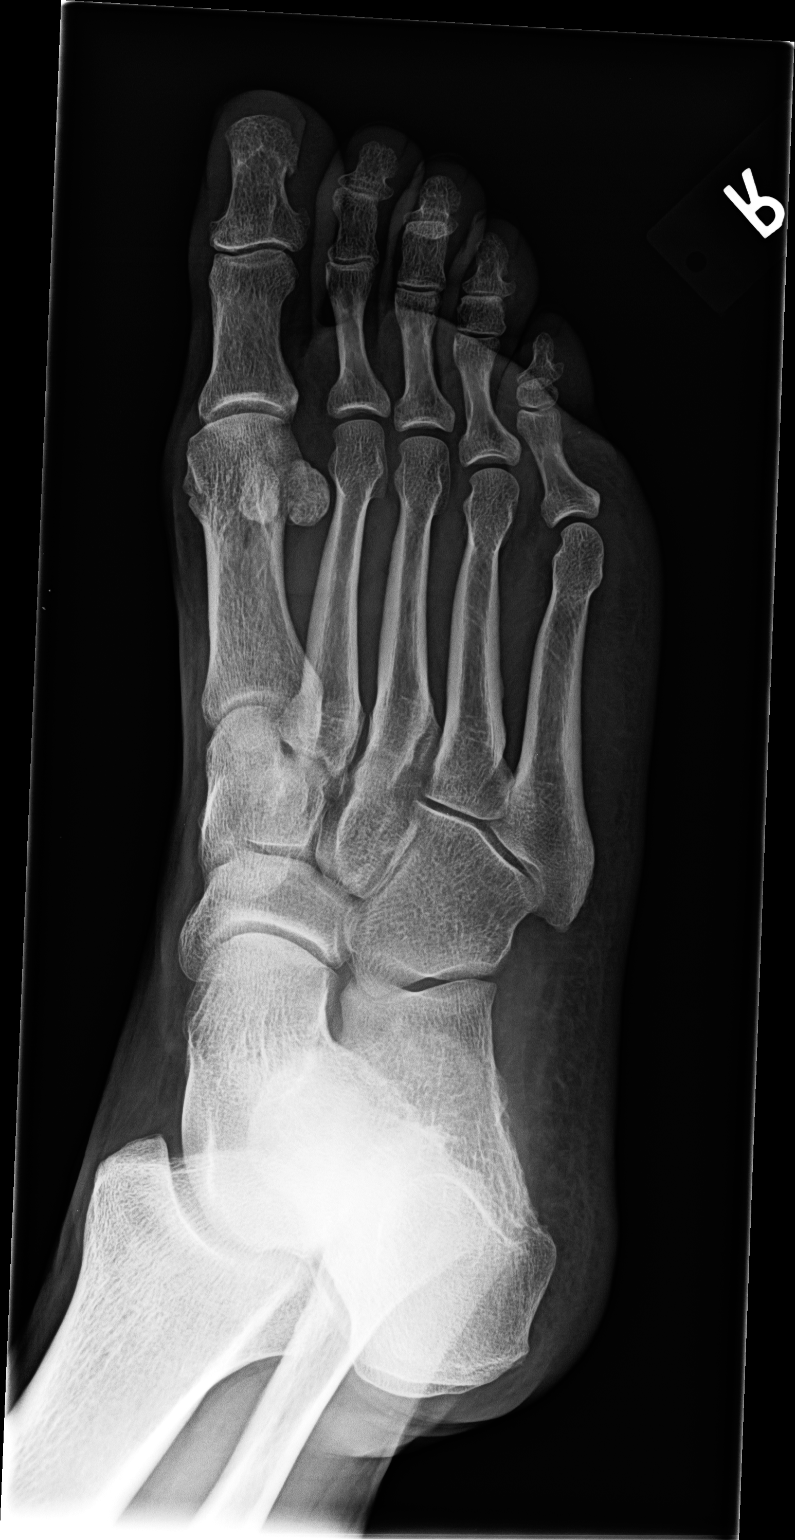

[view not recorded (3 of 3)]
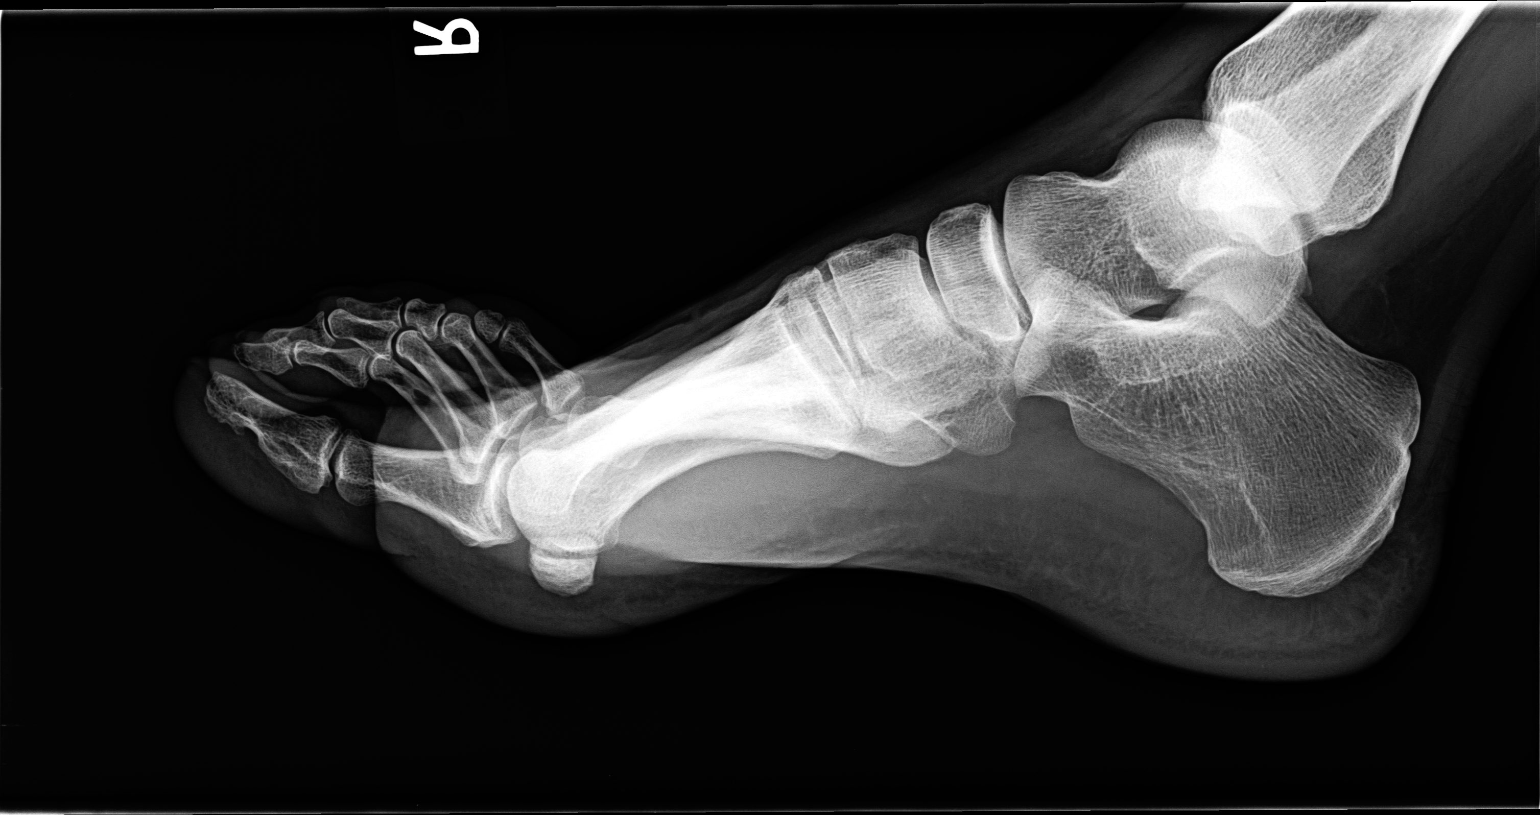

[3 of 3 positions shown; findings below may reference images not displayed]

FINDINGS: There is no evidence of fracture or dislocation. There is no
evidence of arthropathy or other focal bone abnormality. Soft
tissues are unremarkable.
IMPRESSION: No acute abnormality.

## 2014-07-01 ENCOUNTER — Encounter: Payer: Self-pay | Admitting: *Deleted

## 2014-07-15 ENCOUNTER — Encounter: Payer: Self-pay | Admitting: Family

## 2014-07-15 ENCOUNTER — Ambulatory Visit (INDEPENDENT_AMBULATORY_CARE_PROVIDER_SITE_OTHER): Payer: PRIVATE HEALTH INSURANCE | Admitting: Family

## 2014-07-15 VITALS — BP 138/89 | HR 63 | Temp 96.8°F | Ht 67.25 in | Wt 180.0 lb

## 2014-07-15 DIAGNOSIS — M25511 Pain in right shoulder: Secondary | ICD-10-CM | POA: Diagnosis not present

## 2014-07-15 DIAGNOSIS — K219 Gastro-esophageal reflux disease without esophagitis: Secondary | ICD-10-CM | POA: Diagnosis not present

## 2014-07-15 DIAGNOSIS — E785 Hyperlipidemia, unspecified: Secondary | ICD-10-CM | POA: Diagnosis not present

## 2014-07-15 DIAGNOSIS — E039 Hypothyroidism, unspecified: Secondary | ICD-10-CM

## 2014-07-15 DIAGNOSIS — F17200 Nicotine dependence, unspecified, uncomplicated: Secondary | ICD-10-CM | POA: Insufficient documentation

## 2014-07-15 DIAGNOSIS — Z720 Tobacco use: Secondary | ICD-10-CM

## 2014-07-15 DIAGNOSIS — Z125 Encounter for screening for malignant neoplasm of prostate: Secondary | ICD-10-CM | POA: Diagnosis not present

## 2014-07-15 DIAGNOSIS — Z1211 Encounter for screening for malignant neoplasm of colon: Secondary | ICD-10-CM

## 2014-07-15 DIAGNOSIS — R5383 Other fatigue: Secondary | ICD-10-CM | POA: Diagnosis not present

## 2014-07-15 DIAGNOSIS — Z1321 Encounter for screening for nutritional disorder: Secondary | ICD-10-CM

## 2014-07-15 MED ORDER — MELOXICAM 15 MG PO TABS: 15.0000 mg | ORAL_TABLET | Freq: Every day | ORAL | Status: DC

## 2014-07-15 NOTE — Progress Notes (Signed)
Subjective:    Patient ID: Joseph Atkins, male    DOB: [birth year], 54 y.o.   MRN: [id number]  Hyperlipidemia This is a chronic problem. The current episode started more than 1 year ago. The problem is uncontrolled. Recent lipid tests were reviewed and are high. Exacerbating diseases include hypothyroidism. He has no history of diabetes. Factors aggravating his hyperlipidemia include fatty foods and smoking. Pertinent negatives include no leg pain or myalgias. Current antihyperlipidemic treatment includes statins. The current treatment provides mild improvement of lipids. Compliance problems include medication side effects (Pt states he was having nightmares).  Risk factors for coronary artery disease include dyslipidemia, family history, hypertension, male sex, post-menopausal and stress.  Gastrophageal Reflux He reports no belching, no coughing, no heartburn, no sore throat or no wheezing. This is a chronic problem. The current episode started more than 1 year ago. The problem occurs rarely. The problem has been resolved. The symptoms are aggravated by certain foods. Associated symptoms include fatigue. Risk factors include smoking/tobacco exposure. He has tried a PPI for the symptoms. The treatment provided significant relief. Past invasive treatments do not include reflux surgery.  Thyroid Problem Presents for follow-up visit. Symptoms include fatigue. Patient reports no anxiety, depressed mood, diarrhea, hair loss or visual change. The symptoms have been stable. Past treatments include levothyroxine. The treatment provided significant relief. His past medical history is significant for hyperlipidemia. There is no history of diabetes.      Review of Systems  Constitutional: Positive for fatigue.  HENT: Negative.  Negative for sore throat.   Respiratory: Negative.  Negative for cough and wheezing.   Cardiovascular: Negative.   Gastrointestinal: Negative.  Negative for heartburn and  diarrhea.  Endocrine: Negative.   Genitourinary: Negative.   Musculoskeletal: Negative.  Negative for myalgias.  Neurological: Negative.   Hematological: Negative.   Psychiatric/Behavioral: Negative.  The patient is not nervous/anxious.   All other systems reviewed and are negative.      Objective:   Physical Exam  Constitutional: He is oriented to person, place, and time. He appears well-developed and well-nourished. No distress.  HENT:  Head: Normocephalic.  Right Ear: External ear normal.  Left Ear: External ear normal.  Nose: Nose normal.  Mouth/Throat: Oropharynx is clear and moist.  Eyes: Pupils are equal, round, and reactive to light. Right eye exhibits no discharge. Left eye exhibits no discharge.  Neck: Normal range of motion. Neck supple. No thyromegaly present.  Cardiovascular: Normal rate, regular rhythm, normal heart sounds and intact distal pulses.   No murmur heard. Pulmonary/Chest: Effort normal and breath sounds normal. No respiratory distress. He has no wheezes.  Abdominal: Soft. Bowel sounds are normal. He exhibits no distension. There is no tenderness.  Musculoskeletal: Normal range of motion. He exhibits no edema or tenderness.  Neurological: He is alert and oriented to person, place, and time. He has normal reflexes. No cranial nerve deficit.  Skin: Skin is warm and dry. No rash noted. No erythema.  Psychiatric: He has a normal mood and affect. His behavior is normal. Judgment and thought content normal.  Vitals reviewed.     BP 138/89 mmHg  Pulse 63  Temp(Src) 96.8 F (36 C) (Oral)  Ht 5' 7.25" (1.708 m)  Wt 180 lb (81.647 kg)  BMI 27.99 kg/m2     Assessment & Plan:  1. Hyperlipidemia - CMP14+EGFR - Lipid panel  2. Gastroesophageal reflux disease, esophagitis presence not specified - CMP14+EGFR  3. Hypothyroidism, unspecified hypothyroidism type - CMP14+EGFR - Thyroid  Panel With TSH  4. Smoker - CMP14+EGFR  5. Prostate cancer  screening - PSA, total and free  6. Other fatigue - Thyroid Panel With TSH - Vit D  25 hydroxy (rtn osteoporosis monitoring) - Testosterone,Free and Total  7. Encounter for vitamin deficiency screening - Vit D  25 hydroxy (rtn osteoporosis monitoring)  8. Right shoulder pain - meloxicam (MOBIC) 15 MG tablet; Take 1 tablet (15 mg total) by mouth daily.  Dispense: 30 tablet; Refill: 0  9. Encounter for screening colonoscopy - Ambulatory referral to Gastroenterology   Continue all meds Labs pending Health Maintenance reviewed Diet and exercise encouraged RTO 6 weeks for thyroid recheck  Evelina Dun, FNP

## 2014-07-15 NOTE — Patient Instructions (Signed)
Hypothyroidism The thyroid is a large gland located in the lower front of your neck. The thyroid gland helps control metabolism. Metabolism is how your body handles food. It controls metabolism with the hormone thyroxine. When this gland is underactive (hypothyroid), it produces too little hormone.  CAUSES These include:   Absence or destruction of thyroid tissue.  Goiter due to iodine deficiency.  Goiter due to medications.  Congenital defects (since birth).  Problems with the pituitary. This causes a lack of TSH (thyroid stimulating hormone). This hormone tells the thyroid to turn out more hormone. SYMPTOMS  Lethargy (feeling as though you have no energy)  Cold intolerance  Weight gain (in spite of normal food intake)  Dry skin  Coarse hair  Menstrual irregularity (if severe, may lead to infertility)  Slowing of thought processes Cardiac problems are also caused by insufficient amounts of thyroid hormone. Hypothyroidism in the newborn is cretinism, and is an extreme form. It is important that this form be treated adequately and immediately or it will lead rapidly to retarded physical and mental development. DIAGNOSIS  To prove hypothyroidism, your caregiver may do blood tests and ultrasound tests. Sometimes the signs are hidden. It may be necessary for your caregiver to watch this illness with blood tests either before or after diagnosis and treatment. TREATMENT  Low levels of thyroid hormone are increased by using synthetic thyroid hormone. This is a safe, effective treatment. It usually takes about four weeks to gain the full effects of the medication. After you have the full effect of the medication, it will generally take another four weeks for problems to leave. Your caregiver may start you on low doses. If you have had heart problems the dose may be gradually increased. It is generally not an emergency to get rapidly to normal. HOME CARE INSTRUCTIONS   Take your  medications as your caregiver suggests. Let your caregiver know of any medications you are taking or start taking. Your caregiver will help you with dosage schedules.  As your condition improves, your dosage needs may increase. It will be necessary to have continuing blood tests as suggested by your caregiver.  Report all suspected medication side effects to your caregiver. SEEK MEDICAL CARE IF: Seek medical care if you develop:  Sweating.  Tremulousness (tremors).  Anxiety.  Rapid weight loss.  Heat intolerance.  Emotional swings.  Diarrhea.  Weakness. SEEK IMMEDIATE MEDICAL CARE IF:  You develop chest pain, an irregular heart beat (palpitations), or a rapid heart beat. MAKE SURE YOU:   Understand these instructions.  Will watch your condition.  Will get help right away if you are not doing well or get worse. Document Released: 04/11/2005 Document Revised: 07/04/2011 Document Reviewed: 11/30/2007 [employer] Patient Information 2015 [employer]. This information is not intended to replace advice given to you by your health care provider. Make sure you discuss any questions you have with your health care provider. Health Maintenance A healthy lifestyle and preventative care can promote health and wellness.  Maintain regular health, dental, and eye exams.  Eat a healthy diet. Foods like vegetables, fruits, whole grains, low-fat dairy products, and lean protein foods contain the nutrients you need and are low in calories. Decrease your intake of foods high in solid fats, added sugars, and salt. Get information about a proper diet from your health care provider, if necessary.  Regular physical exercise is one of the most important things you can do for your health. Most adults should get at least 150 minutes   of moderate-intensity exercise (any activity that increases your heart rate and causes you to sweat) each week. In addition, most adults need muscle-strengthening exercises  on 2 or more days a week.   Maintain a healthy weight. The body mass index (BMI) is a screening tool to identify possible weight problems. It provides an estimate of body fat based on height and weight. Your health care provider can find your BMI and can help you achieve or maintain a healthy weight. For males 20 years and older:  A BMI below 18.5 is considered underweight.  A BMI of 18.5 to 24.9 is normal.  A BMI of 25 to 29.9 is considered overweight.  A BMI of 30 and above is considered obese.  Maintain normal blood lipids and cholesterol by exercising and minimizing your intake of saturated fat. Eat a balanced diet with plenty of fruits and vegetables. Blood tests for lipids and cholesterol should begin at age 20 and be repeated every 5 years. If your lipid or cholesterol levels are high, you are over age 50, or you are at high risk for heart disease, you may need your cholesterol levels checked more frequently.Ongoing high lipid and cholesterol levels should be treated with medicines if diet and exercise are not working.  If you smoke, find out from your health care provider how to quit. If you do not use tobacco, do not start.  Lung cancer screening is recommended for adults aged 55-80 years who are at high risk for developing lung cancer because of a history of smoking. A yearly low-dose CT scan of the lungs is recommended for people who have at least a 30-pack-year history of smoking and are current smokers or have quit within the past 15 years. A pack year of smoking is smoking an average of 1 pack of cigarettes a day for 1 year (for example, a 30-pack-year history of smoking could mean smoking 1 pack a day for 30 years or 2 packs a day for 15 years). Yearly screening should continue until the smoker has stopped smoking for at least 15 years. Yearly screening should be stopped for people who develop a health problem that would prevent them from having lung cancer treatment.  If you  choose to drink alcohol, do not have more than 2 drinks per day. One drink is considered to be 12 oz (360 mL) of beer, 5 oz (150 mL) of wine, or 1.5 oz (45 mL) of liquor.  Avoid the use of street drugs. Do not share needles with anyone. Ask for help if you need support or instructions about stopping the use of drugs.  High blood pressure causes heart disease and increases the risk of stroke. Blood pressure should be checked at least every 1-2 years. Ongoing high blood pressure should be treated with medicines if weight loss and exercise are not effective.  If you are 45-79 years old, ask your health care provider if you should take aspirin to prevent heart disease.  Diabetes screening involves taking a blood sample to check your fasting blood sugar level. This should be done once every 3 years after age 45 if you are at a normal weight and without risk factors for diabetes. Testing should be considered at a younger age or be carried out more frequently if you are overweight and have at least 1 risk factor for diabetes.  Colorectal cancer can be detected and often prevented. Most routine colorectal cancer screening begins at the age of 50 and continues through age 75.   However, your health care provider may recommend screening at an earlier age if you have risk factors for colon cancer. On a yearly basis, your health care provider may provide home test kits to check for hidden blood in the stool. A small camera at the end of a tube may be used to directly examine the colon (sigmoidoscopy or colonoscopy) to detect the earliest forms of colorectal cancer. Talk to your health care provider about this at age 50 when routine screening begins. A direct exam of the colon should be repeated every 5-10 years through age 75, unless early forms of precancerous polyps or small growths are found.  People who are at an increased risk for hepatitis B should be screened for this virus. You are considered at high risk for  hepatitis B if:  You were born in a country where hepatitis B occurs often. Talk with your health care provider about which countries are considered high risk.  Your parents were born in a high-risk country and you have not received a shot to protect against hepatitis B (hepatitis B vaccine).  You have HIV or AIDS.  You use needles to inject street drugs.  You live with, or have sex with, someone who has hepatitis B.  You are a man who has sex with other men (MSM).  You get hemodialysis treatment.  You take certain medicines for conditions like cancer, organ transplantation, and autoimmune conditions.  Hepatitis C blood testing is recommended for all people born from 1945 through 1965 and any individual with known risk factors for hepatitis C.  Healthy men should no longer receive prostate-specific antigen (PSA) blood tests as part of routine cancer screening. Talk to your health care provider about prostate cancer screening.  Testicular cancer screening is not recommended for adolescents or adult males who have no symptoms. Screening includes self-exam, a health care provider exam, and other screening tests. Consult with your health care provider about any symptoms you have or any concerns you have about testicular cancer.  Practice safe sex. Use condoms and avoid high-risk sexual practices to reduce the spread of sexually transmitted infections (STIs).  You should be screened for STIs, including gonorrhea and chlamydia if:  You are sexually active and are younger than 24 years.  You are older than 24 years, and your health care provider tells you that you are at risk for this type of infection.  Your sexual activity has changed since you were last screened, and you are at an increased risk for chlamydia or gonorrhea. Ask your health care provider if you are at risk.  If you are at risk of being infected with HIV, it is recommended that you take a prescription medicine daily to  prevent HIV infection. This is called pre-exposure prophylaxis (PrEP). You are considered at risk if:  You are a man who has sex with other men (MSM).  You are a heterosexual man who is sexually active with multiple partners.  You take drugs by injection.  You are sexually active with a partner who has HIV.  Talk with your health care provider about whether you are at high risk of being infected with HIV. If you choose to begin PrEP, you should first be tested for HIV. You should then be tested every 3 months for as long as you are taking PrEP.  Use sunscreen. Apply sunscreen liberally and repeatedly throughout the day. You should seek shade when your shadow is shorter than you. Protect yourself by wearing long sleeves,   pants, a wide-brimmed hat, and sunglasses year round whenever you are outdoors.  Tell your health care provider of new moles or changes in moles, especially if there is a change in shape or color. Also, tell your health care provider if a mole is larger than the size of a pencil eraser.  A one-time screening for abdominal aortic aneurysm (AAA) and surgical repair of large AAAs by ultrasound is recommended for men aged 65-75 years who are current or former smokers.  Stay current with your vaccines (immunizations). Document Released: 10/08/2007 Document Revised: 04/16/2013 Document Reviewed: 09/06/2010 [employer] Patient Information 2015 [employer]. This information is not intended to replace advice given to you by your health care provider. Make sure you discuss any questions you have with your health care provider.  

## 2014-07-15 NOTE — Addendum Note (Signed)
Addended by: Earlene Plater on: 07/15/2014 09:00 AM   Modules accepted: Miquel Dunn

## 2014-07-17 ENCOUNTER — Ambulatory Visit: Payer: PRIVATE HEALTH INSURANCE | Admitting: Family

## 2014-07-17 LAB — CMP14+EGFR
ALT: 24 IU/L (ref 0–44)
AST: 20 IU/L (ref 0–40)
Albumin/Globulin Ratio: 2 (ref 1.1–2.5)
Albumin: 4.9 g/dL (ref 3.5–5.5)
Alkaline Phosphatase: 67 IU/L (ref 39–117)
BUN/Creatinine Ratio: 11 (ref 9–20)
BUN: 10 mg/dL (ref 6–24)
Bilirubin Total: 0.4 mg/dL (ref 0.0–1.2)
CO2: 24 mmol/L (ref 18–29)
Calcium: 9.6 mg/dL (ref 8.7–10.2)
Chloride: 102 mmol/L (ref 97–108)
Creatinine, Ser: 0.89 mg/dL (ref 0.76–1.27)
GFR calc Af Amer: 114 mL/min/{1.73_m2} (ref >59)
GFR calc non Af Amer: 98 mL/min/{1.73_m2} (ref >59)
Globulin, Total: 2.4 g/dL (ref 1.5–4.5)
Glucose: 106 mg/dL — ABNORMAL HIGH (ref 65–99)
Potassium: 5.5 mmol/L — ABNORMAL HIGH (ref 3.5–5.2)
Sodium: 141 mmol/L (ref 134–144)
Total Protein: 7.3 g/dL (ref 6.0–8.5)

## 2014-07-17 LAB — TESTOSTERONE,FREE AND TOTAL
Testosterone, Free: 12.2 pg/mL (ref 7.2–24.0)
Testosterone: 523 ng/dL (ref 348–1197)

## 2014-07-17 LAB — THYROID PANEL WITH TSH
Free Thyroxine Index: 2.3 (ref 1.2–4.9)
T3 Uptake Ratio: 23 % — ABNORMAL LOW (ref 24–39)
T4, Total: 9.9 ug/dL (ref 4.5–12.0)
TSH: 3.95 u[IU]/mL (ref 0.450–4.500)

## 2014-07-17 LAB — LIPID PANEL
Chol/HDL Ratio: 5.3 ratio units — ABNORMAL HIGH (ref 0.0–5.0)
Cholesterol, Total: 202 mg/dL — ABNORMAL HIGH (ref 100–199)
HDL: 38 mg/dL — ABNORMAL LOW (ref >39)
LDL Calculated: 141 mg/dL — ABNORMAL HIGH (ref 0–99)
Triglycerides: 114 mg/dL (ref 0–149)
VLDL Cholesterol Cal: 23 mg/dL (ref 5–40)

## 2014-07-17 LAB — PSA, TOTAL AND FREE
PSA, Free Pct: 22 %
PSA, Free: 0.11 ng/mL
PSA: 0.5 ng/mL (ref 0.0–4.0)

## 2014-07-17 LAB — VITAMIN D 25 HYDROXY (VIT D DEFICIENCY, FRACTURES): Vit D, 25-Hydroxy: 12.2 ng/mL — ABNORMAL LOW (ref 30.0–100.0)

## 2014-07-22 ENCOUNTER — Other Ambulatory Visit: Payer: Self-pay | Admitting: Family

## 2014-07-22 DIAGNOSIS — E559 Vitamin D deficiency, unspecified: Secondary | ICD-10-CM | POA: Insufficient documentation

## 2014-07-22 MED ORDER — VITAMIN D (ERGOCALCIFEROL) 1.25 MG (50000 UNIT) PO CAPS: 50000.0000 [IU] | ORAL_CAPSULE | ORAL | Status: DC

## 2014-07-22 MED ORDER — ATORVASTATIN CALCIUM 40 MG PO TABS: 40.0000 mg | ORAL_TABLET | Freq: Every day | ORAL | Status: DC

## 2014-08-13 ENCOUNTER — Other Ambulatory Visit: Payer: Self-pay | Admitting: Family

## 2014-08-28 ENCOUNTER — Encounter: Payer: Self-pay | Admitting: Family

## 2014-08-28 ENCOUNTER — Ambulatory Visit (INDEPENDENT_AMBULATORY_CARE_PROVIDER_SITE_OTHER): Payer: PRIVATE HEALTH INSURANCE | Admitting: Family

## 2014-08-28 VITALS — BP 161/86 | HR 63 | Temp 97.6°F | Ht 67.25 in | Wt 180.0 lb

## 2014-08-28 DIAGNOSIS — E02 Subclinical iodine-deficiency hypothyroidism: Secondary | ICD-10-CM

## 2014-08-28 DIAGNOSIS — K5900 Constipation, unspecified: Secondary | ICD-10-CM | POA: Diagnosis not present

## 2014-08-28 DIAGNOSIS — E559 Vitamin D deficiency, unspecified: Secondary | ICD-10-CM

## 2014-08-28 DIAGNOSIS — K219 Gastro-esophageal reflux disease without esophagitis: Secondary | ICD-10-CM

## 2014-08-28 DIAGNOSIS — E875 Hyperkalemia: Secondary | ICD-10-CM | POA: Diagnosis not present

## 2014-08-28 DIAGNOSIS — Z720 Tobacco use: Secondary | ICD-10-CM

## 2014-08-28 DIAGNOSIS — F17200 Nicotine dependence, unspecified, uncomplicated: Secondary | ICD-10-CM

## 2014-08-28 DIAGNOSIS — E785 Hyperlipidemia, unspecified: Secondary | ICD-10-CM

## 2014-08-28 DIAGNOSIS — I10 Essential (primary) hypertension: Secondary | ICD-10-CM | POA: Insufficient documentation

## 2014-08-28 MED ORDER — HYDROCHLOROTHIAZIDE 25 MG PO TABS: 25.0000 mg | ORAL_TABLET | Freq: Every day | ORAL | Status: DC

## 2014-08-28 NOTE — Progress Notes (Signed)
Subjective:    Patient ID: Joseph Atkins, male    DOB: June 02, 1960, 54 y.o.   MRN: [id number]  Hyperlipidemia This is a chronic problem. The current episode started more than 1 year ago. The problem is uncontrolled. Recent lipid tests were reviewed and are high. Exacerbating diseases include hypothyroidism. He has no history of diabetes. Factors aggravating his hyperlipidemia include fatty foods and smoking. Pertinent negatives include no leg pain or myalgias. Current antihyperlipidemic treatment includes statins. The current treatment provides mild improvement of lipids. Compliance problems include medication side effects (Pt states he was having nightmares).  Risk factors for coronary artery disease include dyslipidemia, family history, hypertension, male sex, post-menopausal and stress.  Gastrophageal Reflux He reports no belching, no coughing, no heartburn, no sore throat or no wheezing. This is a chronic problem. The current episode started more than 1 year ago. The problem occurs rarely. The problem has been resolved. The symptoms are aggravated by certain foods. Associated symptoms include fatigue. Risk factors include smoking/tobacco exposure. He has tried a PPI for the symptoms. The treatment provided significant relief. Past invasive treatments do not include reflux surgery.  Thyroid Problem Presents for follow-up visit. Symptoms include constipation and fatigue. Patient reports no anxiety, depressed mood, diarrhea, hair loss or visual change. The symptoms have been stable. Past treatments include levothyroxine. The treatment provided significant relief. His past medical history is significant for hyperlipidemia. There is no history of diabetes.      Review of Systems  Constitutional: Positive for fatigue.  HENT: Negative.  Negative for sore throat.   Respiratory: Negative.  Negative for cough and wheezing.   Cardiovascular: Negative.   Gastrointestinal: Positive for constipation.  Negative for heartburn and diarrhea.  Endocrine: Negative.   Genitourinary: Negative.   Musculoskeletal: Negative.  Negative for myalgias.  Neurological: Negative.   Hematological: Negative.   Psychiatric/Behavioral: Negative.  The patient is not nervous/anxious.   All other systems reviewed and are negative.      Objective:   Physical Exam  Constitutional: He is oriented to person, place, and time. He appears well-developed and well-nourished. No distress.  HENT:  Head: Normocephalic.  Right Ear: External ear normal.  Left Ear: External ear normal.  Nose: Nose normal.  Mouth/Throat: Oropharynx is clear and moist.  Eyes: Pupils are equal, round, and reactive to light. Right eye exhibits no discharge. Left eye exhibits no discharge.  Neck: Normal range of motion. Neck supple. No thyromegaly present.  Cardiovascular: Normal rate, regular rhythm, normal heart sounds and intact distal pulses.   No murmur heard. Pulmonary/Chest: Effort normal and breath sounds normal. No respiratory distress. He has no wheezes.  Abdominal: Soft. Bowel sounds are normal. He exhibits no distension. There is no tenderness.  Musculoskeletal: Normal range of motion. He exhibits no edema or tenderness.  Neurological: He is alert and oriented to person, place, and time. He has normal reflexes. No cranial nerve deficit.  Skin: Skin is warm and dry. No rash noted. No erythema.  Psychiatric: He has a normal mood and affect. His behavior is normal. Judgment and thought content normal.  Vitals reviewed.    BP 161/86 mmHg  Pulse 63  Temp(Src) 97.6 F (36.4 C) (Oral)  Ht 5' 7.25" (1.708 m)  Wt 180 lb (81.647 kg)  BMI 27.99 kg/m2     Assessment & Plan:  1. Gastroesophageal reflux disease, esophagitis presence not specified - CMP14+EGFR  2. Subclinical iodine-deficiency hypothyroidism - CMP14+EGFR - Thyroid Panel With TSH  3. Hyperlipidemia -  CMP14+EGFR - Lipid panel  4. Vitamin D deficiency -  CMP14+EGFR  5. Smoker - CMP14+EGFR  6. Constipation, unspecified constipation type - CMP14+EGFR  7. Essential hypertension -Pt started on [place] today -Dash diet information given -Exercise encouraged - Stress Management  -Continue current meds -RTO in 2 weeks - hydrochlorothiazide (HYDRODIURIL) 25 MG tablet; Take 1 tablet (25 mg total) by mouth daily.  Dispense: 90 tablet; Refill: 3   Continue all meds Labs pending Health Maintenance reviewed Diet and exercise encouraged RTO 2 weeks   Evelina Dun, FNP

## 2014-08-28 NOTE — Patient Instructions (Addendum)
Hypothyroidism The thyroid is a large gland located in the lower front of your neck. The thyroid gland helps control metabolism. Metabolism is how your body handles food. It controls metabolism with the hormone thyroxine. When this gland is underactive (hypothyroid), it produces too little hormone.  CAUSES These include:   Absence or destruction of thyroid tissue.  Goiter due to iodine deficiency.  Goiter due to medications.  Congenital defects (since birth).  Problems with the pituitary. This causes a lack of TSH (thyroid stimulating hormone). This hormone tells the thyroid to turn out more hormone. SYMPTOMS  Lethargy (feeling as though you have no energy)  Cold intolerance  Weight gain (in spite of normal food intake)  Dry skin  Coarse hair  Menstrual irregularity (if severe, may lead to infertility)  Slowing of thought processes Cardiac problems are also caused by insufficient amounts of thyroid hormone. Hypothyroidism in the newborn is cretinism, and is an extreme form. It is important that this form be treated adequately and immediately or it will lead rapidly to retarded physical and mental development. DIAGNOSIS  To prove hypothyroidism, your caregiver may do blood tests and ultrasound tests. Sometimes the signs are hidden. It may be necessary for your caregiver to watch this illness with blood tests either before or after diagnosis and treatment. TREATMENT  Low levels of thyroid hormone are increased by using synthetic thyroid hormone. This is a safe, effective treatment. It usually takes about four weeks to gain the full effects of the medication. After you have the full effect of the medication, it will generally take another four weeks for problems to leave. Your caregiver may start you on low doses. If you have had heart problems the dose may be gradually increased. It is generally not an emergency to get rapidly to normal. HOME CARE INSTRUCTIONS   Take your  medications as your caregiver suggests. Let your caregiver know of any medications you are taking or start taking. Your caregiver will help you with dosage schedules.  As your condition improves, your dosage needs may increase. It will be necessary to have continuing blood tests as suggested by your caregiver.  Report all suspected medication side effects to your caregiver. SEEK MEDICAL CARE IF: Seek medical care if you develop:  Sweating.  Tremulousness (tremors).  Anxiety.  Rapid weight loss.  Heat intolerance.  Emotional swings.  Diarrhea.  Weakness. SEEK IMMEDIATE MEDICAL CARE IF:  You develop chest pain, an irregular heart beat (palpitations), or a rapid heart beat. MAKE SURE YOU:   Understand these instructions.  Will watch your condition.  Will get help right away if you are not doing well or get worse. Document Released: 04/11/2005 Document Revised: 07/04/2011 Document Reviewed: 11/30/2007 [facility] Patient Information 2015 [place], [place]. This information is not intended to replace advice given to you by your health care provider. Make sure you discuss any questions you have with your health care provider. Hypertension Hypertension, commonly called high blood pressure, is when the force of blood pumping through your arteries is too strong. Your arteries are the blood vessels that carry blood from your heart throughout your body. A blood pressure reading consists of a higher number over a lower number, such as 110/72. The higher number (systolic) is the pressure inside your arteries when your heart pumps. The lower number (diastolic) is the pressure inside your arteries when your heart relaxes. Ideally you want your blood pressure below 120/80. Hypertension forces your heart to work harder to pump blood. Your arteries may  become narrow or stiff. Having hypertension puts you at risk for heart disease, stroke, and other problems.  RISK FACTORS Some risk factors for high  blood pressure are controllable. Others are not.  Risk factors you cannot control include:   Race. You may be at higher risk if you are African American.  Age. Risk increases with age.  Gender. Men are at higher risk than women before age 19 years. After age 41, women are at higher risk than men. Risk factors you can control include:  Not getting enough exercise or physical activity.  Being overweight.  Getting too much fat, sugar, calories, or salt in your diet.  Drinking too much alcohol. SIGNS AND SYMPTOMS Hypertension does not usually cause signs or symptoms. Extremely high blood pressure (hypertensive crisis) may cause headache, anxiety, shortness of breath, and nosebleed. DIAGNOSIS  To check if you have hypertension, your health care provider will measure your blood pressure while you are seated, with your arm held at the level of your heart. It should be measured at least twice using the same arm. Certain conditions can cause a difference in blood pressure between your right and left arms. A blood pressure reading that is higher than normal on one occasion does not mean that you need treatment. If one blood pressure reading is high, ask your health care provider about having it checked again. TREATMENT  Treating high blood pressure includes making lifestyle changes and possibly taking medicine. Living a healthy lifestyle can help lower high blood pressure. You may need to change some of your habits. Lifestyle changes may include:  Following the DASH diet. This diet is high in fruits, vegetables, and whole grains. It is low in salt, red meat, and added sugars.  Getting at least 2 hours of brisk physical activity every week.  Losing weight if necessary.  Not smoking.  Limiting alcoholic beverages.  Learning ways to reduce stress. If lifestyle changes are not enough to get your blood pressure under control, your health care provider may prescribe medicine. You may need to  take more than one. Work closely with your health care provider to understand the risks and benefits. HOME CARE INSTRUCTIONS  Have your blood pressure rechecked as directed by your health care provider.   Take medicines only as directed by your health care provider. Follow the directions carefully. Blood pressure medicines must be taken as prescribed. The medicine does not work as well when you skip doses. Skipping doses also puts you at risk for problems.   Do not smoke.   Monitor your blood pressure at home as directed by your health care provider. SEEK MEDICAL CARE IF:   You think you are having a reaction to medicines taken.  You have recurrent headaches or feel dizzy.  You have swelling in your ankles.  You have trouble with your vision. SEEK IMMEDIATE MEDICAL CARE IF:  You develop a severe headache or confusion.  You have unusual weakness, numbness, or feel faint.  You have severe chest or abdominal pain.  You vomit repeatedly.  You have trouble breathing. MAKE SURE YOU:   Understand these instructions.  Will watch your condition.  Will get help right away if you are not doing well or get worse. Document Released: 04/11/2005 Document Revised: 08/26/2013 Document Reviewed: 02/01/2013 [facility] Patient Information 2015 [place], [place]. This information is not intended to replace advice given to you by your health care provider. Make sure you discuss any questions you have with your health care provider.

## 2014-08-29 ENCOUNTER — Other Ambulatory Visit: Payer: Self-pay | Admitting: Family

## 2014-08-29 DIAGNOSIS — E875 Hyperkalemia: Secondary | ICD-10-CM

## 2014-08-29 LAB — CMP14+EGFR
ALT: 24 IU/L (ref 0–44)
AST: 26 IU/L (ref 0–40)
Albumin/Globulin Ratio: 2.4 (ref 1.1–2.5)
Albumin: 4.7 g/dL (ref 3.5–5.5)
Alkaline Phosphatase: 62 IU/L (ref 39–117)
BUN/Creatinine Ratio: 12 (ref 9–20)
BUN: 12 mg/dL (ref 6–24)
Bilirubin Total: 0.7 mg/dL (ref 0.0–1.2)
CO2: 25 mmol/L (ref 18–29)
Calcium: 9.5 mg/dL (ref 8.7–10.2)
Chloride: 102 mmol/L (ref 97–108)
Creatinine, Ser: 0.97 mg/dL (ref 0.76–1.27)
GFR calc Af Amer: 103 mL/min/{1.73_m2} (ref >59)
GFR calc non Af Amer: 89 mL/min/{1.73_m2} (ref >59)
Globulin, Total: 2 g/dL (ref 1.5–4.5)
Glucose: 98 mg/dL (ref 65–99)
Potassium: 5.6 mmol/L — ABNORMAL HIGH (ref 3.5–5.2)
Sodium: 142 mmol/L (ref 134–144)
Total Protein: 6.7 g/dL (ref 6.0–8.5)

## 2014-08-29 LAB — THYROID PANEL WITH TSH
Free Thyroxine Index: 2.2 (ref 1.2–4.9)
T3 Uptake Ratio: 24 % (ref 24–39)
T4, Total: 9 ug/dL (ref 4.5–12.0)
TSH: 3.81 u[IU]/mL (ref 0.450–4.500)

## 2014-08-29 LAB — LIPID PANEL
Chol/HDL Ratio: 3.6 ratio units (ref 0.0–5.0)
Cholesterol, Total: 113 mg/dL (ref 100–199)
HDL: 31 mg/dL — ABNORMAL LOW (ref >39)
LDL Calculated: 63 mg/dL (ref 0–99)
Triglycerides: 97 mg/dL (ref 0–149)
VLDL Cholesterol Cal: 19 mg/dL (ref 5–40)

## 2014-08-29 MED ORDER — KAYEXALATE PO POWD: 15.0000 g | Freq: Every day | ORAL | Status: DC

## 2014-08-29 NOTE — Progress Notes (Signed)
Quick Note:  Kidney and liver function stable Potassium elevated- Pt needs to stop any supplements with K+, RX of Kayexalate sent to pharmacy- Pt to take for next 2-3 days and have lab redrawn Cholesterol levels WNL Thyroid levels WNL ______

## 2014-09-02 NOTE — Progress Notes (Signed)
lmtcb

## 2014-09-05 ENCOUNTER — Other Ambulatory Visit (INDEPENDENT_AMBULATORY_CARE_PROVIDER_SITE_OTHER): Payer: PRIVATE HEALTH INSURANCE

## 2014-09-05 DIAGNOSIS — E875 Hyperkalemia: Secondary | ICD-10-CM

## 2014-09-05 NOTE — Addendum Note (Signed)
Addended by: Thana Ates on: 09/05/2014 10:09 AM   Modules accepted: Orders

## 2014-09-05 NOTE — Progress Notes (Signed)
Lab only 

## 2014-09-06 LAB — BMP8+EGFR
BUN/Creatinine Ratio: 19 (ref 9–20)
BUN: 16 mg/dL (ref 6–24)
CO2: 22 mmol/L (ref 18–29)
Calcium: 10 mg/dL (ref 8.7–10.2)
Chloride: 97 mmol/L (ref 97–108)
Creatinine, Ser: 0.83 mg/dL (ref 0.76–1.27)
GFR calc Af Amer: 117 mL/min/{1.73_m2} (ref >59)
GFR calc non Af Amer: 101 mL/min/{1.73_m2} (ref >59)
Glucose: 106 mg/dL — ABNORMAL HIGH (ref 65–99)
Potassium: 4.8 mmol/L (ref 3.5–5.2)
Sodium: 138 mmol/L (ref 134–144)

## 2014-09-08 ENCOUNTER — Telehealth: Payer: Self-pay | Admitting: *Deleted

## 2014-09-08 NOTE — Telephone Encounter (Signed)
-----   Message from Sharion Balloon, [place] sent at 09/08/2014  8:21 AM EDT ----- Kidney  function stable Potassium WNL

## 2014-09-08 NOTE — Telephone Encounter (Signed)
Aware of results.  He can stop potassium supplement per provider.

## 2014-09-15 ENCOUNTER — Ambulatory Visit (INDEPENDENT_AMBULATORY_CARE_PROVIDER_SITE_OTHER): Payer: PRIVATE HEALTH INSURANCE | Admitting: Family

## 2014-09-15 ENCOUNTER — Encounter: Payer: Self-pay | Admitting: Family

## 2014-09-15 VITALS — BP 145/87 | HR 74 | Temp 97.0°F | Wt 178.6 lb

## 2014-09-15 DIAGNOSIS — K2100 Gastro-esophageal reflux disease with esophagitis, without bleeding: Secondary | ICD-10-CM

## 2014-09-15 DIAGNOSIS — E038 Other specified hypothyroidism: Secondary | ICD-10-CM

## 2014-09-15 DIAGNOSIS — I10 Essential (primary) hypertension: Secondary | ICD-10-CM | POA: Diagnosis not present

## 2014-09-15 MED ORDER — LISINOPRIL 20 MG PO TABS: 20.0000 mg | ORAL_TABLET | Freq: Every day | ORAL | Status: DC

## 2014-09-15 MED ORDER — LEVOTHYROXINE SODIUM 25 MCG PO TABS: 25.0000 ug | ORAL_TABLET | Freq: Every day | ORAL | Status: DC

## 2014-09-15 MED ORDER — OMEPRAZOLE 40 MG PO CPDR: 40.0000 mg | DELAYED_RELEASE_CAPSULE | Freq: Every day | ORAL | Status: DC

## 2014-09-15 NOTE — Progress Notes (Signed)
Subjective:    Patient ID: Joseph Atkins, male    DOB: Mar 18, 1961, 54 y.o.   MRN: [id number]   Pt presents to the office to recheck HTN. Pt's BP is not at goal.  Hypertension This is a chronic problem. The current episode started more than 1 year ago. The problem has been waxing and waning since onset. The problem is uncontrolled. Pertinent negatives include no chest pain, headaches, palpitations, peripheral edema or shortness of breath. Risk factors for coronary artery disease include dyslipidemia, male gender, family history and smoking/tobacco exposure. Past treatments include diuretics. The current treatment provides moderate improvement. There is no history of kidney disease, CAD/MI, CVA, heart failure or a thyroid problem. There is no history of sleep apnea.  Gastrophageal Reflux He complains of a sore throat. He reports no chest pain, no coughing, no heartburn or no hoarse voice. This is a chronic problem. The current episode started more than 1 year ago. The problem occurs occasionally. The problem has been waxing and waning. The symptoms are aggravated by certain foods. He has tried a PPI (PT has been out of his PPI for last few days) for the symptoms. The treatment provided significant relief.      Review of Systems  Constitutional: Negative.   HENT: Positive for sore throat. Negative for hoarse voice.   Respiratory: Negative.  Negative for cough and shortness of breath.   Cardiovascular: Negative.  Negative for chest pain and palpitations.  Gastrointestinal: Negative.  Negative for heartburn.  Endocrine: Negative.   Genitourinary: Negative.   Musculoskeletal: Negative.   Neurological: Negative.  Negative for headaches.  Hematological: Negative.   Psychiatric/Behavioral: Negative.   All other systems reviewed and are negative.      Objective:   Physical Exam  Constitutional: He is oriented to person, place, and time. He appears well-developed and well-nourished. No  distress.  HENT:  Head: Normocephalic.  Right Ear: External ear normal.  Left Ear: External ear normal.  Nose: Nose normal.  Mouth/Throat: Oropharynx is clear and moist.  Eyes: Pupils are equal, round, and reactive to light. Right eye exhibits no discharge. Left eye exhibits no discharge.  Neck: Normal range of motion. Neck supple. No thyromegaly present.  Cardiovascular: Normal rate, regular rhythm, normal heart sounds and intact distal pulses.   No murmur heard. Pulmonary/Chest: Effort normal and breath sounds normal. No respiratory distress. He has no wheezes.  Abdominal: Soft. Bowel sounds are normal. He exhibits no distension. There is no tenderness.  Musculoskeletal: Normal range of motion. He exhibits no edema or tenderness.  Neurological: He is alert and oriented to person, place, and time. He has normal reflexes. No cranial nerve deficit.  Skin: Skin is warm and dry. No rash noted. No erythema.  Psychiatric: He has a normal mood and affect. His behavior is normal. Judgment and thought content normal.  Vitals reviewed.     BP 145/87 mmHg  Pulse 74  Temp(Src) 97 F (36.1 C) (Oral)  Wt 178 lb 9.6 oz (81.012 kg)     Assessment & Plan:  1. Other specified hypothyroidism - levothyroxine (LEVOTHROID) 25 MCG tablet; Take 1 tablet (25 mcg total) by mouth daily before breakfast.  Dispense: 90 tablet; Refill: 4 - CMP14+EGFR  2. Gastroesophageal reflux disease with esophagitis - omeprazole (PRILOSEC) 40 MG capsule; Take 1 capsule (40 mg total) by mouth daily.  Dispense: 90 capsule; Refill: 4 - CMP14+EGFR  3. Essential hypertension -Pt started on Lisinopril 20 mg started today -Dash diet information given -  Exercise encouraged - Stress Management  -Continue current meds -RTO in 2 weeks - lisinopril (PRINIVIL,ZESTRIL) 20 MG tablet; Take 1 tablet (20 mg total) by mouth daily.  Dispense: 90 tablet; Refill: 3 - [place], FNP

## 2014-09-15 NOTE — Patient Instructions (Signed)
DASH Eating Plan DASH stands for "Dietary Approaches to Stop Hypertension." The DASH eating plan is a healthy eating plan that has been shown to reduce high blood pressure (hypertension). Additional health benefits may include reducing the risk of type 2 diabetes mellitus, heart disease, and stroke. The DASH eating plan may also help with weight loss. WHAT DO I NEED TO KNOW ABOUT THE DASH EATING PLAN? For the DASH eating plan, you will follow these general guidelines:  Choose foods with a percent daily value for sodium of less than 5% (as listed on the food label).  Use salt-free seasonings or herbs instead of table salt or sea salt.  Check with your health care provider or pharmacist before using salt substitutes.  Eat lower-sodium products, often labeled as "lower sodium" or "no salt added."  Eat fresh foods.  Eat more vegetables, fruits, and low-fat dairy products.  Choose whole grains. Look for the word "whole" as the first word in the ingredient list.  Choose fish and skinless chicken or turkey more often than red meat. Limit fish, poultry, and meat to 6 oz (170 g) each day.  Limit sweets, desserts, sugars, and sugary drinks.  Choose heart-healthy fats.  Limit cheese to 1 oz (28 g) per day.  Eat more home-cooked food and less restaurant, buffet, and fast food.  Limit fried foods.  Cook foods using methods other than frying.  Limit canned vegetables. If you do use them, rinse them well to decrease the sodium.  When eating at a restaurant, ask that your food be prepared with less salt, or no salt if possible. WHAT FOODS CAN I EAT? Seek help from a dietitian for individual calorie needs. Grains Whole grain or whole wheat bread. Brown rice. Whole grain or whole wheat pasta. Quinoa, bulgur, and whole grain cereals. Low-sodium cereals. Corn or whole wheat flour tortillas. Whole grain cornbread. Whole grain crackers. Low-sodium crackers. Vegetables Fresh or frozen vegetables  (raw, steamed, roasted, or grilled). Low-sodium or reduced-sodium tomato and vegetable juices. Low-sodium or reduced-sodium tomato sauce and paste. Low-sodium or reduced-sodium canned vegetables.  Fruits All fresh, canned (in natural juice), or frozen fruits. Meat and Other Protein Products Ground beef (85% or leaner), grass-fed beef, or beef trimmed of fat. Skinless chicken or turkey. Ground chicken or turkey. Pork trimmed of fat. All fish and seafood. Eggs. Dried beans, peas, or lentils. Unsalted nuts and seeds. Unsalted canned beans. Dairy Low-fat dairy products, such as skim or 1% milk, 2% or reduced-fat cheeses, low-fat ricotta or cottage cheese, or plain low-fat yogurt. Low-sodium or reduced-sodium cheeses. Fats and Oils Tub margarines without trans fats. Light or reduced-fat mayonnaise and salad dressings (reduced sodium). Avocado. Safflower, olive, or canola oils. Natural peanut or almond butter. Other Unsalted popcorn and pretzels. The items listed above may not be a complete list of recommended foods or beverages. Contact your dietitian for more options. WHAT FOODS ARE NOT RECOMMENDED? Grains White bread. White pasta. White rice. Refined cornbread. Bagels and croissants. Crackers that contain trans fat. Vegetables Creamed or fried vegetables. Vegetables in a cheese sauce. Regular canned vegetables. Regular canned tomato sauce and paste. Regular tomato and vegetable juices. Fruits Dried fruits. Canned fruit in light or heavy syrup. Fruit juice. Meat and Other Protein Products Fatty cuts of meat. Ribs, chicken wings, bacon, sausage, bologna, salami, chitterlings, fatback, hot dogs, bratwurst, and packaged luncheon meats. Salted nuts and seeds. Canned beans with salt. Dairy Whole or 2% milk, cream, half-and-half, and cream cheese. Whole-fat or sweetened yogurt. Full-fat   cheeses or blue cheese. Nondairy creamers and whipped toppings. Processed cheese, cheese spreads, or cheese  curds. Condiments Onion and garlic salt, seasoned salt, table salt, and sea salt. Canned and packaged gravies. Worcestershire sauce. Tartar sauce. Barbecue sauce. Teriyaki sauce. Soy sauce, including reduced sodium. Steak sauce. Fish sauce. Oyster sauce. Cocktail sauce. Horseradish. Ketchup and mustard. Meat flavorings and tenderizers. Bouillon cubes. Hot sauce. Tabasco sauce. Marinades. Taco seasonings. Relishes. Fats and Oils Butter, stick margarine, lard, shortening, ghee, and bacon fat. Coconut, palm kernel, or palm oils. Regular salad dressings. Other Pickles and olives. Salted popcorn and pretzels. The items listed above may not be a complete list of foods and beverages to avoid. Contact your dietitian for more information. WHERE CAN I FIND MORE INFORMATION? National Heart, [employer], [employer] Institute: www.nhlbi.nih.gov/health/health-topics/topics/dash/ Document Released: 03/31/2011 Document Revised: 08/26/2013 Document Reviewed: 02/13/2013 ExitCare Patient Information 2015 [employer]. This information is not intended to replace advice given to you by your health care provider. Make sure you discuss any questions you have with your health care provider. Hypertension Hypertension, commonly called high blood pressure, is when the force of blood pumping through your arteries is too strong. Your arteries are the blood vessels that carry blood from your heart throughout your body. A blood pressure reading consists of a higher number over a lower number, such as 110/72. The higher number (systolic) is the pressure inside your arteries when your heart pumps. The lower number (diastolic) is the pressure inside your arteries when your heart relaxes. Ideally you want your blood pressure below 120/80. Hypertension forces your heart to work harder to pump blood. Your arteries may become narrow or stiff. Having hypertension puts you at risk for heart disease, stroke, and other problems.  RISK  FACTORS Some risk factors for high blood pressure are controllable. Others are not.  Risk factors you cannot control include:   Race. You may be at higher risk if you are African American.  Age. Risk increases with age.  Gender. Men are at higher risk than women before age 45 years. After age 65, women are at higher risk than men. Risk factors you can control include:  Not getting enough exercise or physical activity.  Being overweight.  Getting too much fat, sugar, calories, or salt in your diet.  Drinking too much alcohol. SIGNS AND SYMPTOMS Hypertension does not usually cause signs or symptoms. Extremely high blood pressure (hypertensive crisis) may cause headache, anxiety, shortness of breath, and nosebleed. DIAGNOSIS  To check if you have hypertension, your health care provider will measure your blood pressure while you are seated, with your arm held at the level of your heart. It should be measured at least twice using the same arm. Certain conditions can cause a difference in blood pressure between your right and left arms. A blood pressure reading that is higher than normal on one occasion does not mean that you need treatment. If one blood pressure reading is high, ask your health care provider about having it checked again. TREATMENT  Treating high blood pressure includes making lifestyle changes and possibly taking medicine. Living a healthy lifestyle can help lower high blood pressure. You may need to change some of your habits. Lifestyle changes may include:  Following the DASH diet. This diet is high in fruits, vegetables, and whole grains. It is low in salt, red meat, and added sugars.  Getting at least 2 hours of brisk physical activity every week.  Losing weight if necessary.  Not smoking.  Limiting   alcoholic beverages.  Learning ways to reduce stress. If lifestyle changes are not enough to get your blood pressure under control, your health care provider may  prescribe medicine. You may need to take more than one. Work closely with your health care provider to understand the risks and benefits. HOME CARE INSTRUCTIONS  Have your blood pressure rechecked as directed by your health care provider.   Take medicines only as directed by your health care provider. Follow the directions carefully. Blood pressure medicines must be taken as prescribed. The medicine does not work as well when you skip doses. Skipping doses also puts you at risk for problems.   Do not smoke.   Monitor your blood pressure at home as directed by your health care provider. SEEK MEDICAL CARE IF:   You think you are having a reaction to medicines taken.  You have recurrent headaches or feel dizzy.  You have swelling in your ankles.  You have trouble with your vision. SEEK IMMEDIATE MEDICAL CARE IF:  You develop a severe headache or confusion.  You have unusual weakness, numbness, or feel faint.  You have severe chest or abdominal pain.  You vomit repeatedly.  You have trouble breathing. MAKE SURE YOU:   Understand these instructions.  Will watch your condition.  Will get help right away if you are not doing well or get worse. Document Released: 04/11/2005 Document Revised: 08/26/2013 Document Reviewed: 02/01/2013 ExitCare Patient Information 2015 [employer]. This information is not intended to replace advice given to you by your health care provider. Make sure you discuss any questions you have with your health care provider.  

## 2014-09-29 ENCOUNTER — Ambulatory Visit (INDEPENDENT_AMBULATORY_CARE_PROVIDER_SITE_OTHER): Payer: PRIVATE HEALTH INSURANCE | Admitting: Family

## 2014-09-29 ENCOUNTER — Encounter: Payer: Self-pay | Admitting: Family

## 2014-09-29 VITALS — BP 113/69 | HR 69 | Temp 97.2°F | Ht 67.25 in | Wt 174.2 lb

## 2014-09-29 DIAGNOSIS — E875 Hyperkalemia: Secondary | ICD-10-CM

## 2014-09-29 DIAGNOSIS — I10 Essential (primary) hypertension: Secondary | ICD-10-CM

## 2014-09-29 NOTE — Progress Notes (Signed)
Subjective:    Patient ID: Joseph Atkins, male    DOB: 06-01-1960, 54 y.o.   MRN: [id number]  Pt presents to the office today to recheck HTN. PT's BP is at goal today! Hypertension This is a chronic problem. The current episode started more than 1 year ago. The problem has been resolved since onset. The problem is controlled. Pertinent negatives include no anxiety, headaches, palpitations, peripheral edema or shortness of breath. Risk factors for coronary artery disease include dyslipidemia, family history, male gender and smoking/tobacco exposure. Past treatments include diuretics and ACE inhibitors. The current treatment provides significant improvement. There is no history of kidney disease, CAD/MI, CVA, heart failure or a thyroid problem.      Review of Systems  Constitutional: Negative.   HENT: Negative.   Respiratory: Negative.  Negative for shortness of breath.   Cardiovascular: Negative.  Negative for palpitations.  Gastrointestinal: Negative.   Endocrine: Negative.   Genitourinary: Negative.   Musculoskeletal: Negative.   Neurological: Negative.  Negative for headaches.  Hematological: Negative.   Psychiatric/Behavioral: Negative.   All other systems reviewed and are negative.      Objective:   Physical Exam  Constitutional: He is oriented to person, place, and time. He appears well-developed and well-nourished. No distress.  HENT:  Head: Normocephalic.  Eyes: Pupils are equal, round, and reactive to light. Right eye exhibits no discharge. Left eye exhibits no discharge.  Neck: Normal range of motion. Neck supple. No thyromegaly present.  Cardiovascular: Normal rate, regular rhythm, normal heart sounds and intact distal pulses.   No murmur heard. Pulmonary/Chest: Effort normal and breath sounds normal. No respiratory distress. He has no wheezes.  Abdominal: Soft. Bowel sounds are normal. He exhibits no distension. There is no tenderness.  Musculoskeletal: Normal  range of motion. He exhibits no edema or tenderness.  Neurological: He is alert and oriented to person, place, and time. He has normal reflexes. No cranial nerve deficit.  Skin: Skin is warm and dry. No rash noted. No erythema.  Psychiatric: He has a normal mood and affect. His behavior is normal. Judgment and thought content normal.  Vitals reviewed.     BP 113/69 mmHg  Pulse 69  Temp(Src) 97.2 F (36.2 C) (Oral)  Ht 5' 7.25" (1.708 m)  Wt 174 lb 3.2 oz (79.017 kg)  BMI 27.09 kg/m2     Assessment & Plan:  1. Essential hypertension -Dash diet information given -Exercise encouraged - Stress Management  -Continue current meds -RTO in 6 months - BMP8+EGFR  Evelina Dun, FNP

## 2014-09-29 NOTE — Patient Instructions (Signed)
DASH Eating Plan DASH stands for "Dietary Approaches to Stop Hypertension." The DASH eating plan is a healthy eating plan that has been shown to reduce high blood pressure (hypertension). Additional health benefits may include reducing the risk of type 2 diabetes mellitus, heart disease, and stroke. The DASH eating plan may also help with weight loss. WHAT DO I NEED TO KNOW ABOUT THE DASH EATING PLAN? For the DASH eating plan, you will follow these general guidelines:  Choose foods with a percent daily value for sodium of less than 5% (as listed on the food label).  Use salt-free seasonings or herbs instead of table salt or sea salt.  Check with your health care provider or pharmacist before using salt substitutes.  Eat lower-sodium products, often labeled as "lower sodium" or "no salt added."  Eat fresh foods.  Eat more vegetables, fruits, and low-fat dairy products.  Choose whole grains. Look for the word "whole" as the first word in the ingredient list.  Choose fish and skinless chicken or turkey more often than red meat. Limit fish, poultry, and meat to 6 oz (170 g) each day.  Limit sweets, desserts, sugars, and sugary drinks.  Choose heart-healthy fats.  Limit cheese to 1 oz (28 g) per day.  Eat more home-cooked food and less restaurant, buffet, and fast food.  Limit fried foods.  Cook foods using methods other than frying.  Limit canned vegetables. If you do use them, rinse them well to decrease the sodium.  When eating at a restaurant, ask that your food be prepared with less salt, or no salt if possible. WHAT FOODS CAN I EAT? Seek help from a dietitian for individual calorie needs. Grains Whole grain or whole wheat bread. Brown rice. Whole grain or whole wheat pasta. Quinoa, bulgur, and whole grain cereals. Low-sodium cereals. Corn or whole wheat flour tortillas. Whole grain cornbread. Whole grain crackers. Low-sodium crackers. Vegetables Fresh or frozen vegetables  (raw, steamed, roasted, or grilled). Low-sodium or reduced-sodium tomato and vegetable juices. Low-sodium or reduced-sodium tomato sauce and paste. Low-sodium or reduced-sodium canned vegetables.  Fruits All fresh, canned (in natural juice), or frozen fruits. Meat and Other Protein Products Ground beef (85% or leaner), grass-fed beef, or beef trimmed of fat. Skinless chicken or turkey. Ground chicken or turkey. Pork trimmed of fat. All fish and seafood. Eggs. Dried beans, peas, or lentils. Unsalted nuts and seeds. Unsalted canned beans. Dairy Low-fat dairy products, such as skim or 1% milk, 2% or reduced-fat cheeses, low-fat ricotta or cottage cheese, or plain low-fat yogurt. Low-sodium or reduced-sodium cheeses. Fats and Oils Tub margarines without trans fats. Light or reduced-fat mayonnaise and salad dressings (reduced sodium). Avocado. Safflower, olive, or canola oils. Natural peanut or almond butter. Other Unsalted popcorn and pretzels. The items listed above may not be a complete list of recommended foods or beverages. Contact your dietitian for more options. WHAT FOODS ARE NOT RECOMMENDED? Grains White bread. White pasta. White rice. Refined cornbread. Bagels and croissants. Crackers that contain trans fat. Vegetables Creamed or fried vegetables. Vegetables in a cheese sauce. Regular canned vegetables. Regular canned tomato sauce and paste. Regular tomato and vegetable juices. Fruits Dried fruits. Canned fruit in light or heavy syrup. Fruit juice. Meat and Other Protein Products Fatty cuts of meat. Ribs, chicken wings, bacon, sausage, bologna, salami, chitterlings, fatback, hot dogs, bratwurst, and packaged luncheon meats. Salted nuts and seeds. Canned beans with salt. Dairy Whole or 2% milk, cream, half-and-half, and cream cheese. Whole-fat or sweetened yogurt. Full-fat   cheeses or blue cheese. Nondairy creamers and whipped toppings. Processed cheese, cheese spreads, or cheese  curds. Condiments Onion and garlic salt, seasoned salt, table salt, and sea salt. Canned and packaged gravies. Worcestershire sauce. Tartar sauce. Barbecue sauce. Teriyaki sauce. Soy sauce, including reduced sodium. Steak sauce. Fish sauce. Oyster sauce. Cocktail sauce. Horseradish. Ketchup and mustard. Meat flavorings and tenderizers. Bouillon cubes. Hot sauce. Tabasco sauce. Marinades. Taco seasonings. Relishes. Fats and Oils Butter, stick margarine, lard, shortening, ghee, and bacon fat. Coconut, palm kernel, or palm oils. Regular salad dressings. Other Pickles and olives. Salted popcorn and pretzels. The items listed above may not be a complete list of foods and beverages to avoid. Contact your dietitian for more information. WHERE CAN I FIND MORE INFORMATION? National Heart, [employer], [employer] Institute: www.nhlbi.nih.gov/health/health-topics/topics/dash/ Document Released: 03/31/2011 Document Revised: 08/26/2013 Document Reviewed: 02/13/2013 ExitCare Patient Information 2015 [employer]. This information is not intended to replace advice given to you by your health care provider. Make sure you discuss any questions you have with your health care provider. Hypertension Hypertension, commonly called high blood pressure, is when the force of blood pumping through your arteries is too strong. Your arteries are the blood vessels that carry blood from your heart throughout your body. A blood pressure reading consists of a higher number over a lower number, such as 110/72. The higher number (systolic) is the pressure inside your arteries when your heart pumps. The lower number (diastolic) is the pressure inside your arteries when your heart relaxes. Ideally you want your blood pressure below 120/80. Hypertension forces your heart to work harder to pump blood. Your arteries may become narrow or stiff. Having hypertension puts you at risk for heart disease, stroke, and other problems.  RISK  FACTORS Some risk factors for high blood pressure are controllable. Others are not.  Risk factors you cannot control include:   Race. You may be at higher risk if you are African American.  Age. Risk increases with age.  Gender. Men are at higher risk than women before age 45 years. After age 65, women are at higher risk than men. Risk factors you can control include:  Not getting enough exercise or physical activity.  Being overweight.  Getting too much fat, sugar, calories, or salt in your diet.  Drinking too much alcohol. SIGNS AND SYMPTOMS Hypertension does not usually cause signs or symptoms. Extremely high blood pressure (hypertensive crisis) may cause headache, anxiety, shortness of breath, and nosebleed. DIAGNOSIS  To check if you have hypertension, your health care provider will measure your blood pressure while you are seated, with your arm held at the level of your heart. It should be measured at least twice using the same arm. Certain conditions can cause a difference in blood pressure between your right and left arms. A blood pressure reading that is higher than normal on one occasion does not mean that you need treatment. If one blood pressure reading is high, ask your health care provider about having it checked again. TREATMENT  Treating high blood pressure includes making lifestyle changes and possibly taking medicine. Living a healthy lifestyle can help lower high blood pressure. You may need to change some of your habits. Lifestyle changes may include:  Following the DASH diet. This diet is high in fruits, vegetables, and whole grains. It is low in salt, red meat, and added sugars.  Getting at least 2 hours of brisk physical activity every week.  Losing weight if necessary.  Not smoking.  Limiting   alcoholic beverages.  Learning ways to reduce stress. If lifestyle changes are not enough to get your blood pressure under control, your health care provider may  prescribe medicine. You may need to take more than one. Work closely with your health care provider to understand the risks and benefits. HOME CARE INSTRUCTIONS  Have your blood pressure rechecked as directed by your health care provider.   Take medicines only as directed by your health care provider. Follow the directions carefully. Blood pressure medicines must be taken as prescribed. The medicine does not work as well when you skip doses. Skipping doses also puts you at risk for problems.   Do not smoke.   Monitor your blood pressure at home as directed by your health care provider. SEEK MEDICAL CARE IF:   You think you are having a reaction to medicines taken.  You have recurrent headaches or feel dizzy.  You have swelling in your ankles.  You have trouble with your vision. SEEK IMMEDIATE MEDICAL CARE IF:  You develop a severe headache or confusion.  You have unusual weakness, numbness, or feel faint.  You have severe chest or abdominal pain.  You vomit repeatedly.  You have trouble breathing. MAKE SURE YOU:   Understand these instructions.  Will watch your condition.  Will get help right away if you are not doing well or get worse. Document Released: 04/11/2005 Document Revised: 08/26/2013 Document Reviewed: 02/01/2013 ExitCare Patient Information 2015 [employer]. This information is not intended to replace advice given to you by your health care provider. Make sure you discuss any questions you have with your health care provider.  

## 2014-09-30 ENCOUNTER — Other Ambulatory Visit: Payer: Self-pay | Admitting: Family

## 2014-09-30 DIAGNOSIS — E875 Hyperkalemia: Secondary | ICD-10-CM

## 2014-09-30 LAB — BMP8+EGFR
BUN/Creatinine Ratio: 15 (ref 9–20)
BUN: 16 mg/dL (ref 6–24)
CO2: 25 mmol/L (ref 18–29)
Calcium: 9.6 mg/dL (ref 8.7–10.2)
Chloride: 95 mmol/L — ABNORMAL LOW (ref 97–108)
Creatinine, Ser: 1.08 mg/dL (ref 0.76–1.27)
GFR calc Af Amer: 91 mL/min/{1.73_m2} (ref >59)
GFR calc non Af Amer: 78 mL/min/{1.73_m2} (ref >59)
Glucose: 97 mg/dL (ref 65–99)
Potassium: 5.5 mmol/L — ABNORMAL HIGH (ref 3.5–5.2)
Sodium: 135 mmol/L (ref 134–144)

## 2014-09-30 NOTE — Addendum Note (Signed)
Addended by: Thana Ates on: 09/30/2014 09:19 AM   Modules accepted: Orders

## 2015-01-01 ENCOUNTER — Encounter: Payer: Self-pay | Admitting: Family Medicine

## 2015-01-01 ENCOUNTER — Ambulatory Visit (INDEPENDENT_AMBULATORY_CARE_PROVIDER_SITE_OTHER): Payer: Worker's Compensation

## 2015-01-01 ENCOUNTER — Ambulatory Visit (INDEPENDENT_AMBULATORY_CARE_PROVIDER_SITE_OTHER): Payer: Worker's Compensation | Admitting: Family Medicine

## 2015-01-01 VITALS — BP 98/62 | HR 92 | Temp 97.2°F | Ht 67.25 in | Wt 171.8 lb

## 2015-01-01 DIAGNOSIS — M79641 Pain in right hand: Secondary | ICD-10-CM | POA: Diagnosis not present

## 2015-01-01 IMAGING — CR DG HAND COMPLETE 3+V*R*
3 series · 3 of 3 positions shown · non-contrast
Comparison: None in PACs

CLINICAL DATA: Direct trauma to the right hand with persistent pain
and swelling

EXAM:
RIGHT HAND - COMPLETE 3+ VIEW

[view not recorded (1 of 3)]
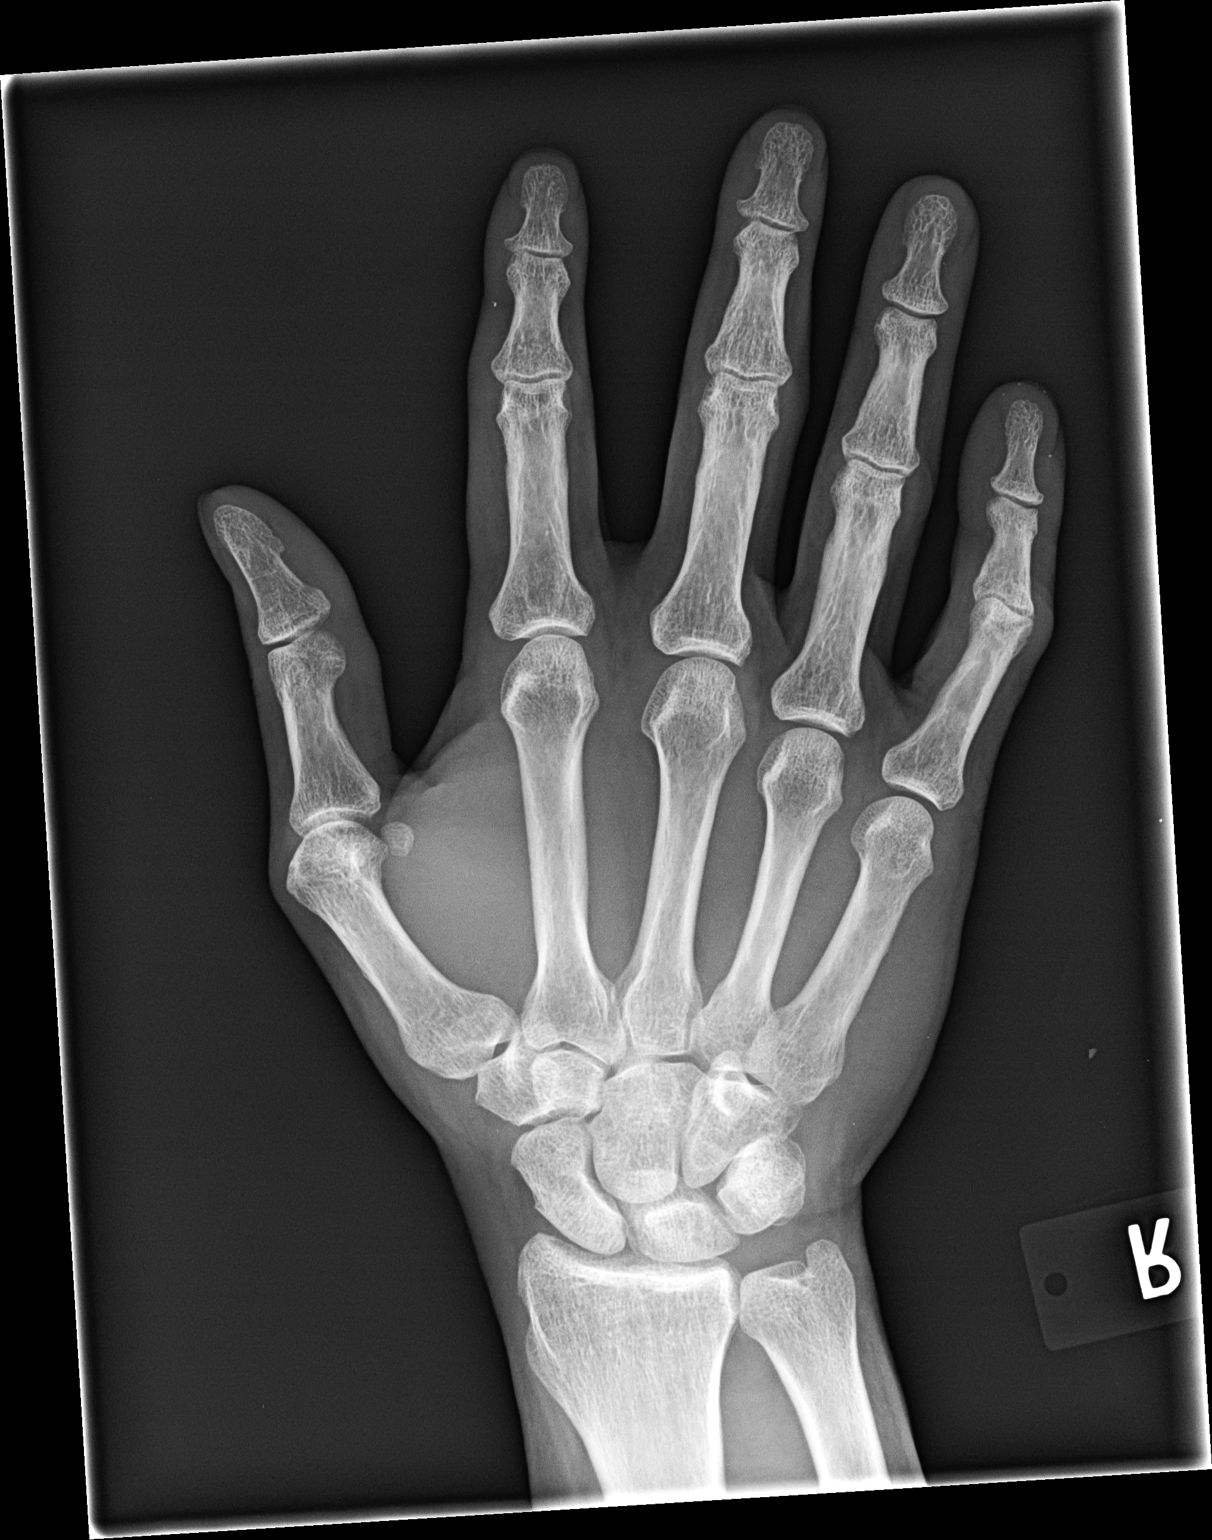

[view not recorded (2 of 3)]
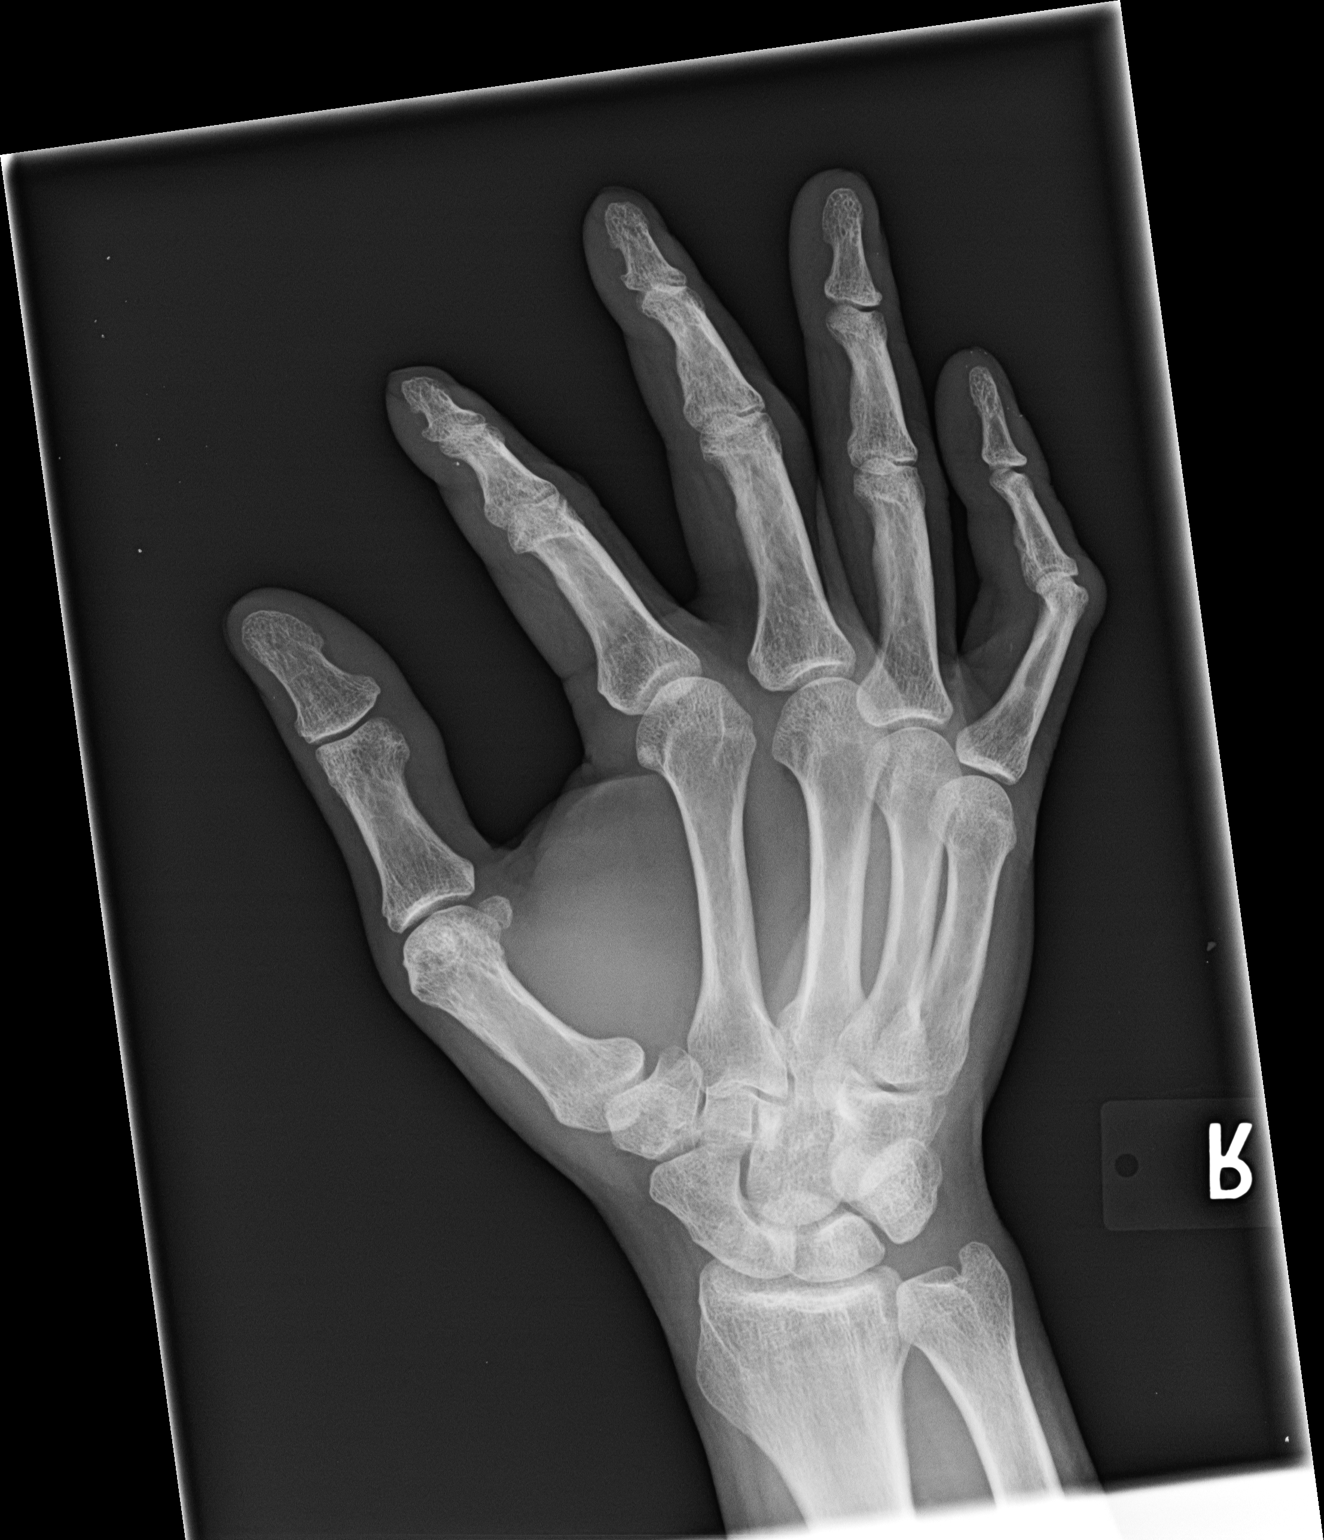

[view not recorded (3 of 3)]
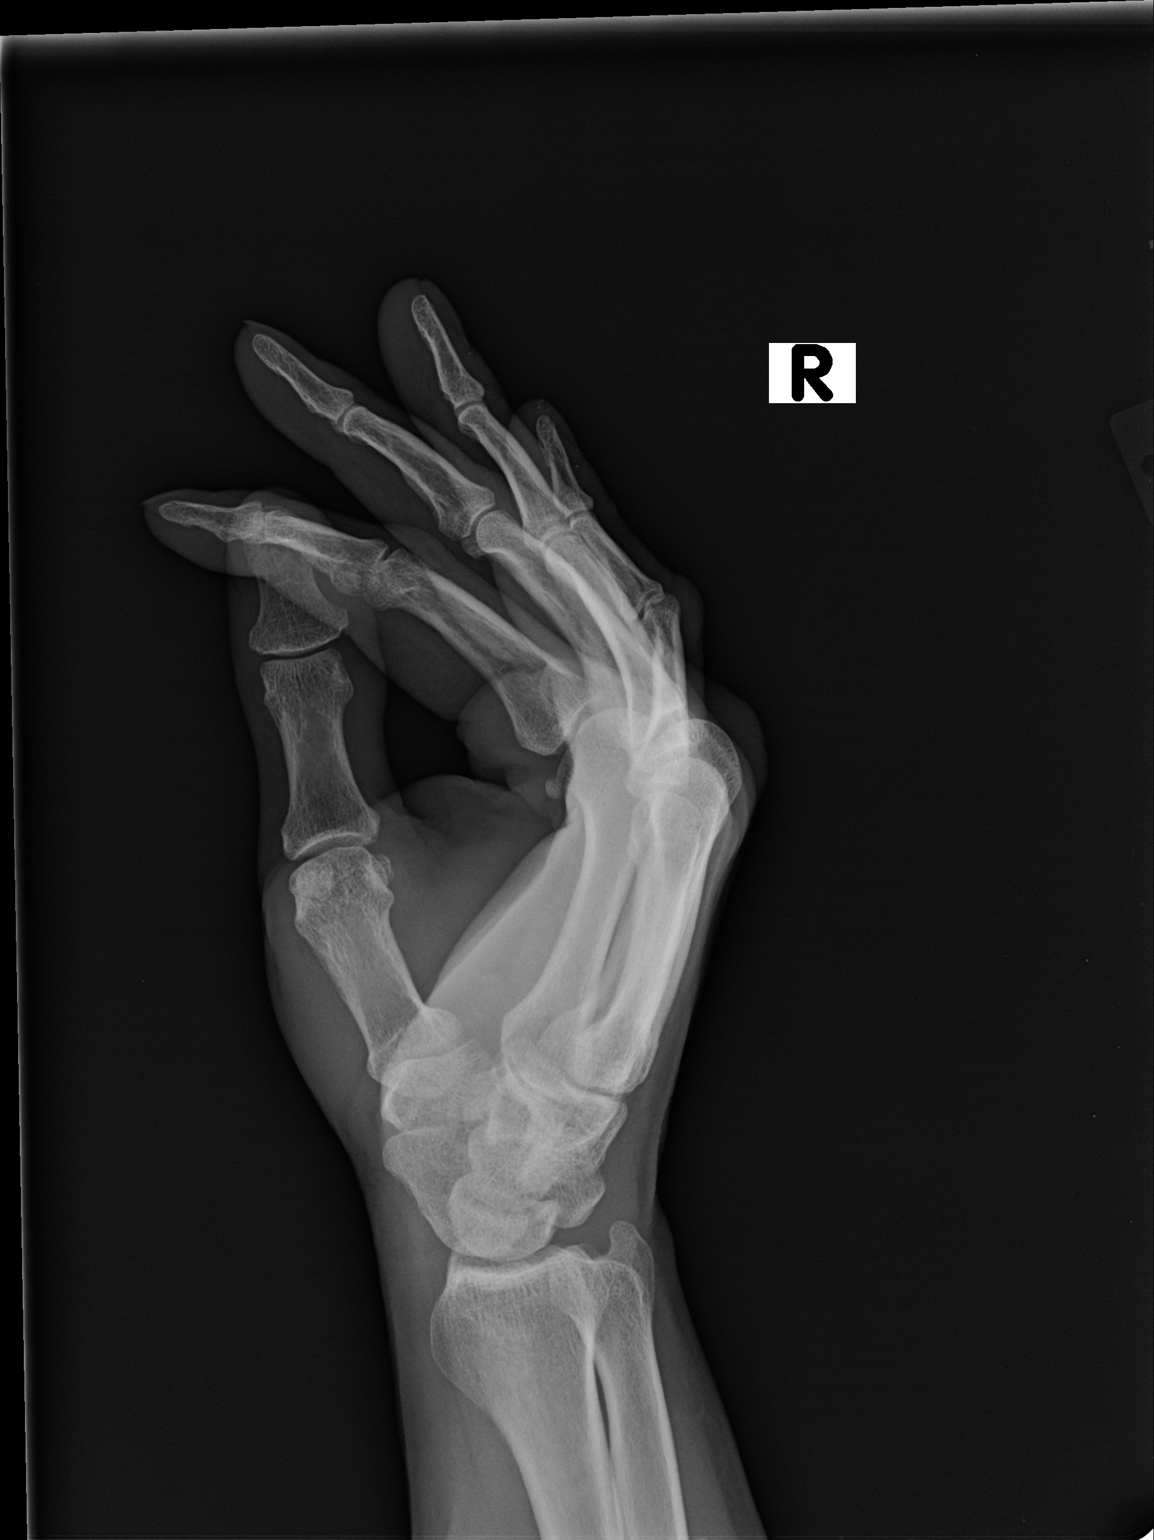

[3 of 3 positions shown; findings below may reference images not displayed]

FINDINGS: The bones of the hand are reasonably well mineralized for age. The
joint spaces are preserved. There is no acute fracture nor
dislocation. The soft tissues exhibit no significant swelling or
evidence of foreign bodies.
IMPRESSION: There is no acute bony abnormality of the right hand.

## 2015-01-01 NOTE — Patient Instructions (Signed)
Contusion °A contusion is a deep bruise. Contusions are the result of an injury that caused bleeding under the skin. The contusion may turn blue, purple, or yellow. Minor injuries will give you a painless contusion, but more severe contusions may stay painful and swollen for a few weeks.  °CAUSES  °A contusion is usually caused by a blow, trauma, or direct force to an area of the body. °SYMPTOMS  °· Swelling and redness of the injured area. °· Bruising of the injured area. °· Tenderness and soreness of the injured area. °· Pain. °DIAGNOSIS  °The diagnosis can be made by taking a history and physical exam. An X-ray, CT scan, or MRI may be needed to determine if there were any associated injuries, such as fractures. °TREATMENT  °Specific treatment will depend on what area of the body was injured. In general, the best treatment for a contusion is resting, icing, elevating, and applying cold compresses to the injured area. Over-the-counter medicines may also be recommended for pain control. Ask your caregiver what the best treatment is for your contusion. °HOME CARE INSTRUCTIONS  °· Put ice on the injured area. °¨ Put ice in a plastic bag. °¨ Place a towel between your skin and the bag. °¨ Leave the ice on for 15-20 minutes, 3-4 times a day, or as directed by your health care provider. °· Only take over-the-counter or prescription medicines for pain, discomfort, or fever as directed by your caregiver. Your caregiver may recommend avoiding anti-inflammatory medicines (aspirin, ibuprofen, and naproxen) for 48 hours because these medicines may increase bruising. °· Rest the injured area. °· If possible, elevate the injured area to reduce swelling. °SEEK IMMEDIATE MEDICAL CARE IF:  °· You have increased bruising or swelling. °· You have pain that is getting worse. °· Your swelling or pain is not relieved with medicines. °MAKE SURE YOU:  °· Understand these instructions. °· Will watch your condition. °· Will get help right  away if you are not doing well or get worse. °Document Released: 01/19/2005 Document Revised: 04/16/2013 Document Reviewed: 02/14/2011 °ExitCare® Patient Information ©2015 [employer]. This information is not intended to replace advice given to you by your health care provider. Make sure you discuss any questions you have with your health care provider. ° °

## 2015-01-01 NOTE — Progress Notes (Signed)
BP 98/62 mmHg  Pulse 92  Temp(Src) 97.2 F (36.2 C) (Oral)  Ht 5' 7.25" (1.708 m)  Wt 171 lb 12.8 oz (77.928 kg)  BMI 26.71 kg/m2   Subjective:    Patient ID: Joseph Atkins, male    DOB: 1961/03/07, 54 y.o.   MRN: [id number]  HPI: Joseph Atkins is a 54 y.o. male presenting on 01/01/2015 for Hand Pain   HPI Hand injury at work, worker's comp Earlier today, 01/01/2015, patient was at work at [employer] working with typing and one of the copper pipes fell on his right hand near his third and fourth metacarpal phalangeal joint. Since that point he has been having difficulty with pain and flexes his hand because of the pain. He can flex it is just very painful passive range of motion is also painful. He does have some swelling and the start of some bruising over those 2 knuckles. He also has some tingling in the fingers of the hand. He is using his home Mobic and some ice to help. He is here for worker's comp and get an x-ray.  Relevant past medical, surgical, family and social history reviewed and updated as indicated. Interim medical history since our last visit reviewed. Allergies and medications reviewed and updated.  Review of Systems  Constitutional: Negative for fever and chills.  HENT: Negative for ear discharge and ear pain.   Eyes: Negative for discharge and visual disturbance.  Respiratory: Negative for chest tightness, shortness of breath and wheezing.   Cardiovascular: Negative for chest pain, palpitations and leg swelling.  Gastrointestinal: Negative for abdominal pain, diarrhea and constipation.  Genitourinary: Negative for difficulty urinating.  Musculoskeletal: Positive for joint swelling and arthralgias. Negative for back pain and gait problem.  Skin: Negative for rash and wound.  Neurological: Negative for dizziness, syncope, light-headedness and headaches.  All other systems reviewed and are negative.   Per HPI unless specifically indicated above       Medication List       This list is accurate as of: 01/01/15 12:02 PM.  Always use your most recent med list.               atorvastatin 40 MG tablet  Commonly known as:  LIPITOR  Take 1 tablet (40 mg total) by mouth daily.     hydrochlorothiazide 25 MG tablet  Commonly known as:  HYDRODIURIL  Take 1 tablet (25 mg total) by mouth daily.     levothyroxine 25 MCG tablet  Commonly known as:  LEVOTHROID  Take 1 tablet (25 mcg total) by mouth daily before breakfast.     lisinopril 20 MG tablet  Commonly known as:  PRINIVIL,ZESTRIL  Take 1 tablet (20 mg total) by mouth daily.     meloxicam 15 MG tablet  Commonly known as:  MOBIC  TAKE 1 TABLET (15 MG TOTAL) BY MOUTH DAILY.     omeprazole 40 MG capsule  Commonly known as:  PRILOSEC  Take 1 capsule (40 mg total) by mouth daily.     polyethylene glycol powder powder  Commonly known as:  MIRALAX  Take 17 g by mouth daily.     Vitamin D (Ergocalciferol) 50000 UNITS Caps capsule  Commonly known as:  DRISDOL  Take 1 capsule (50,000 Units total) by mouth every 7 (seven) days.           Objective:    BP 98/62 mmHg  Pulse 92  Temp(Src) 97.2 F (36.2 C) (Oral)  Ht 5'  7.25" (1.708 m)  Wt 171 lb 12.8 oz (77.928 kg)  BMI 26.71 kg/m2  Wt Readings from Last 3 Encounters:  01/01/15 171 lb 12.8 oz (77.928 kg)  09/29/14 174 lb 3.2 oz (79.017 kg)  09/15/14 178 lb 9.6 oz (81.012 kg)    Physical Exam  Constitutional: He is oriented to person, place, and time. He appears well-developed and well-nourished. No distress.  Eyes: Conjunctivae and EOM are normal. Pupils are equal, round, and reactive to light. Right eye exhibits no discharge. No scleral icterus.  Cardiovascular: Normal rate, regular rhythm, normal heart sounds and intact distal pulses.   No murmur heard. Pulmonary/Chest: Effort normal and breath sounds normal. No respiratory distress. He has no wheezes.  Abdominal: He exhibits no distension.  Musculoskeletal: He  exhibits tenderness. He exhibits no edema.       Right hand: He exhibits decreased range of motion and tenderness. He exhibits no deformity and no laceration. Normal sensation noted. Normal strength noted.       Hands: Neurological: He is alert and oriented to person, place, and time. Coordination normal.  Skin: Skin is warm and dry. No rash noted. He is not diaphoretic.  Psychiatric: He has a normal mood and affect. His behavior is normal.  Vitals reviewed.   Results for orders placed or performed in visit on 09/29/14  [facility]  Result Value Ref Range   Glucose 97 65 - 99 mg/dL   BUN 16 6 - 24 mg/dL   Creatinine, Ser 1.08 0.76 - 1.27 mg/dL   GFR calc non Af Amer 78 >59 mL/min/1.73   GFR calc Af Amer 91 >59 mL/min/1.73   BUN/Creatinine Ratio 15 9 - 20   Sodium 135 134 - 144 mmol/L   Potassium 5.5 (H) 3.5 - 5.2 mmol/L   Chloride 95 (L) 97 - 108 mmol/L   CO2 25 18 - 29 mmol/L   Calcium 9.6 8.7 - 10.2 mg/dL   Right hand x-ray: Personally reviewed the images and no bony fracture or abnormality has been seen.    Assessment & Plan:   Problem List Items Addressed This Visit    None    Visit Diagnoses    Pain of right hand    -  Primary    recommend office work only and no lifting with that right hand. Also f/u in 1 week.    Relevant Orders    DG Hand Complete Right (Completed)        Follow up plan: Return in about 1 week (around 01/08/2015) for worker's comp, hand injury.  Caryl Pina, MD [facility] 01/01/2015, 12:02 PM

## 2015-01-08 ENCOUNTER — Ambulatory Visit (INDEPENDENT_AMBULATORY_CARE_PROVIDER_SITE_OTHER): Payer: Worker's Compensation | Admitting: Family Medicine

## 2015-01-08 ENCOUNTER — Encounter: Payer: Self-pay | Admitting: Family Medicine

## 2015-01-08 VITALS — BP 98/57 | HR 80 | Temp 97.0°F | Ht 67.25 in | Wt 171.4 lb

## 2015-01-08 DIAGNOSIS — M79641 Pain in right hand: Secondary | ICD-10-CM

## 2015-01-08 NOTE — Progress Notes (Signed)
BP 98/57 mmHg  Pulse 80  Temp(Src) 97 F (36.1 C) (Oral)  Ht 5' 7.25" (1.708 m)  Wt 171 lb 6.4 oz (77.747 kg)  BMI 26.65 kg/m2   Subjective:    Patient ID: Joseph Atkins, male    DOB: August 04, 1960, 54 y.o.   MRN: [id number]  HPI: Joseph Atkins is a 54 y.o. male presenting on 01/08/2015 for Follow up right hand pain   HPI Hand injury at work, worker's comp Injury on 01/01/2015, patient was at work at [employer] working with typing and one of the copper pipes fell on his right hand near his third and fourth metacarpal phalangeal joint. His pain is almost completely resolved and he has almost full grip strength and very minimal tenderness at the third DIP joint. Also has mild swelling at the third DIP joint but is otherwise doing well.  Relevant past medical, surgical, family and social history reviewed and updated as indicated. Interim medical history since our last visit reviewed. Allergies and medications reviewed and updated.  Review of Systems  Constitutional: Negative for fever and chills.  HENT: Negative for ear discharge and ear pain.   Eyes: Negative for discharge and visual disturbance.  Respiratory: Negative for chest tightness, shortness of breath and wheezing.   Cardiovascular: Negative for chest pain, palpitations and leg swelling.  Gastrointestinal: Negative for abdominal pain, diarrhea and constipation.  Genitourinary: Negative for difficulty urinating.  Musculoskeletal: Positive for joint swelling and arthralgias. Negative for back pain and gait problem.  Skin: Negative for rash and wound.  Neurological: Negative for dizziness, syncope, light-headedness and headaches.  All other systems reviewed and are negative.   Per HPI unless specifically indicated above     Medication List       This list is accurate as of: 01/08/15  8:45 AM.  Always use your most recent med list.               atorvastatin 40 MG tablet  Commonly known as:  LIPITOR  Take 1  tablet (40 mg total) by mouth daily.     hydrochlorothiazide 25 MG tablet  Commonly known as:  HYDRODIURIL  Take 1 tablet (25 mg total) by mouth daily.     levothyroxine 25 MCG tablet  Commonly known as:  LEVOTHROID  Take 1 tablet (25 mcg total) by mouth daily before breakfast.     lisinopril 20 MG tablet  Commonly known as:  PRINIVIL,ZESTRIL  Take 1 tablet (20 mg total) by mouth daily.     meloxicam 15 MG tablet  Commonly known as:  MOBIC  TAKE 1 TABLET (15 MG TOTAL) BY MOUTH DAILY.     omeprazole 40 MG capsule  Commonly known as:  PRILOSEC  Take 1 capsule (40 mg total) by mouth daily.     polyethylene glycol powder powder  Commonly known as:  MIRALAX  Take 17 g by mouth daily.     Vitamin D (Ergocalciferol) 50000 UNITS Caps capsule  Commonly known as:  DRISDOL  Take 1 capsule (50,000 Units total) by mouth every 7 (seven) days.           Objective:    BP 98/57 mmHg  Pulse 80  Temp(Src) 97 F (36.1 C) (Oral)  Ht 5' 7.25" (1.708 m)  Wt 171 lb 6.4 oz (77.747 kg)  BMI 26.65 kg/m2  Wt Readings from Last 3 Encounters:  01/08/15 171 lb 6.4 oz (77.747 kg)  01/01/15 171 lb 12.8 oz (77.928 kg)  09/29/14 174  lb 3.2 oz (79.017 kg)    Physical Exam  Constitutional: He is oriented to person, place, and time. He appears well-developed and well-nourished. No distress.  Eyes: Conjunctivae and EOM are normal. Pupils are equal, round, and reactive to light. Right eye exhibits no discharge. No scleral icterus.  Cardiovascular: Normal rate, regular rhythm, normal heart sounds and intact distal pulses.   No murmur heard. Pulmonary/Chest: Effort normal and breath sounds normal. No respiratory distress. He has no wheezes.  Abdominal: He exhibits no distension.  Musculoskeletal: He exhibits tenderness. He exhibits no edema.       Right hand: He exhibits decreased range of motion and tenderness. He exhibits no deformity and no laceration. Normal sensation noted. Normal strength noted.         Hands: Neurological: He is alert and oriented to person, place, and time. Coordination normal.  Skin: Skin is warm and dry. No rash noted. He is not diaphoretic.  Psychiatric: He has a normal mood and affect. His behavior is normal.  Vitals reviewed.   Results for orders placed or performed in visit on 09/29/14  [facility]  Result Value Ref Range   Glucose 97 65 - 99 mg/dL   BUN 16 6 - 24 mg/dL   Creatinine, Ser 1.08 0.76 - 1.27 mg/dL   GFR calc non Af Amer 78 >59 mL/min/1.73   GFR calc Af Amer 91 >59 mL/min/1.73   BUN/Creatinine Ratio 15 9 - 20   Sodium 135 134 - 144 mmol/L   Potassium 5.5 (H) 3.5 - 5.2 mmol/L   Chloride 95 (L) 97 - 108 mmol/L   CO2 25 18 - 29 mmol/L   Calcium 9.6 8.7 - 10.2 mg/dL      Assessment & Plan:   Problem List Items Addressed This Visit    None    Visit Diagnoses    Pain of right hand    -  Primary    Patient comes in on Worker's Comp. for recheck of his pain in her right hand after the trauma at work. Pain resolved ready to go back to work.        Follow up plan: No Follow-up on file.  Caryl Pina, MD [facility] 01/08/2015, 8:45 AM

## 2015-01-15 ENCOUNTER — Ambulatory Visit (INDEPENDENT_AMBULATORY_CARE_PROVIDER_SITE_OTHER): Payer: PRIVATE HEALTH INSURANCE | Admitting: Physician Assistant

## 2015-01-15 ENCOUNTER — Encounter: Payer: Self-pay | Admitting: Physician Assistant

## 2015-01-15 VITALS — BP 123/71 | HR 66 | Temp 97.0°F | Ht 67.25 in | Wt 169.6 lb

## 2015-01-15 DIAGNOSIS — K047 Periapical abscess without sinus: Secondary | ICD-10-CM | POA: Diagnosis not present

## 2015-01-15 MED ORDER — HYDROCODONE-ACETAMINOPHEN 5-325 MG PO TABS: 1.0000 | ORAL_TABLET | Freq: Four times a day (QID) | ORAL | Status: DC | PRN

## 2015-01-15 MED ORDER — CLINDAMYCIN HCL 300 MG PO CAPS: 300.0000 mg | ORAL_CAPSULE | Freq: Four times a day (QID) | ORAL | Status: DC

## 2015-01-15 NOTE — Progress Notes (Signed)
   Subjective:    Patient ID: MATHIUS BIRKELAND, male    DOB: June 19, 1960, 54 y.o.   MRN: [id number]  HPI 54 y/o male presents with abscessed tooth on right side. He has an appt with his dentist next week for extraction. He is having trouble sleeping at night d/t the pain .     Review of Systems  Constitutional: Negative.   HENT:       Swollen jaw, right tooth pain   All other systems reviewed and are negative.      Objective:   Physical Exam  Constitutional: He appears well-developed and well-nourished. No distress.  HENT:  Edematous right jaw , dental caries on right side   Skin: He is not diaphoretic.  Nursing note and vitals reviewed.         Assessment & Plan:  1. Dental abscess  - clindamycin (CLEOCIN) 300 MG capsule; Take 1 capsule (300 mg total) by mouth 4 (four) times daily.  Dispense: 40 capsule; Refill: 0 - HYDROcodone-acetaminophen (NORCO) 5-325 MG per tablet; Take 1 tablet by mouth every 6 (six) hours as needed for moderate pain.  Dispense: 30 tablet; Refill: 0   RTO if s/s worsen or do noti improve   Suzy Kugel A. Benjamin Stain PA-C

## 2016-06-15 ENCOUNTER — Telehealth: Payer: Self-pay | Admitting: Family Medicine

## 2016-06-15 ENCOUNTER — Telehealth: Payer: Self-pay | Admitting: *Deleted

## 2016-06-15 ENCOUNTER — Encounter: Payer: Self-pay | Admitting: Family Medicine

## 2016-06-15 ENCOUNTER — Ambulatory Visit (INDEPENDENT_AMBULATORY_CARE_PROVIDER_SITE_OTHER): Payer: BLUE CROSS/BLUE SHIELD | Admitting: Family Medicine

## 2016-06-15 ENCOUNTER — Ambulatory Visit (HOSPITAL_COMMUNITY)
Admission: RE | Admit: 2016-06-15 | Discharge: 2016-06-15 | Disposition: A | Discharge status: Home / Self Care | Payer: BLUE CROSS/BLUE SHIELD | Source: Ambulatory Visit | Attending: Family Medicine | Admitting: Family Medicine

## 2016-06-15 VITALS — BP 143/76 | HR 69 | Temp 97.0°F | Ht 67.25 in | Wt 176.8 lb

## 2016-06-15 DIAGNOSIS — M545 Low back pain, unspecified: Secondary | ICD-10-CM

## 2016-06-15 DIAGNOSIS — R1031 Right lower quadrant pain: Secondary | ICD-10-CM | POA: Insufficient documentation

## 2016-06-15 DIAGNOSIS — K769 Liver disease, unspecified: Secondary | ICD-10-CM | POA: Insufficient documentation

## 2016-06-15 LAB — URINALYSIS, COMPLETE
Bilirubin, UA: NEGATIVE
Glucose, UA: NEGATIVE
Ketones, UA: NEGATIVE
Leukocytes, UA: NEGATIVE
Nitrite, UA: NEGATIVE
Protein, UA: NEGATIVE
RBC, UA: NEGATIVE
Specific Gravity, UA: 1.02 (ref 1.005–1.030)
Urobilinogen, Ur: 0.2 mg/dL (ref 0.2–1.0)
pH, UA: 5.5 (ref 5.0–7.5)

## 2016-06-15 LAB — MICROSCOPIC EXAMINATION
Bacteria, UA: NONE SEEN
Epithelial Cells (non renal): NONE SEEN /hpf (ref 0–10)
RBC, UA: NONE SEEN /hpf (ref 0–?)
Renal Epithel, UA: NONE SEEN /hpf
WBC, UA: NONE SEEN /hpf (ref 0–?)

## 2016-06-15 IMAGING — CT CT ABD-PELV W/ CM
2 of 5 series · 16 of 46 positions shown, 18 images · IV contrast (iopamidol)
Comparison: CT abdomen pelvis [DATE]

CLINICAL DATA: Right lower quadrant pain and flank pain

EXAM:
CT ABDOMEN AND PELVIS WITH CONTRAST
TECHNIQUE: Multidetector CT imaging of the abdomen and pelvis was performed
using the standard protocol following bolus administration of
intravenous contrast.
CONTRAST:  100mL [JR] IOPAMIDOL ([JR]) INJECTION 61%,
30mL [JR] IOPAMIDOL ([JR]) INJECTION 61%

[Series 2: axial st · axial · 0.76mm/px · z∈[-607,-227]mm · 13 of 86 slices shown, 15 images]
[im 5/86  soft-tissue]
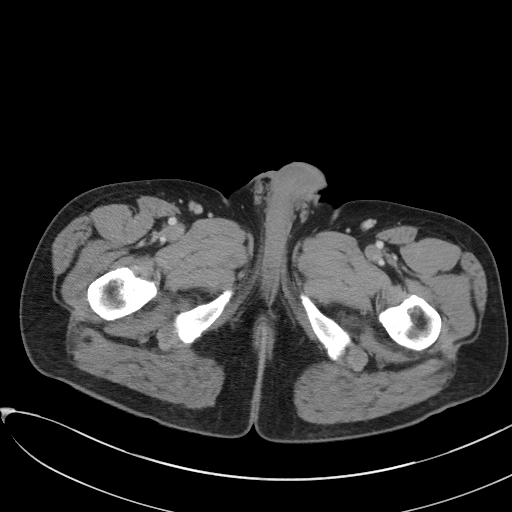
[im 5/86  bone]
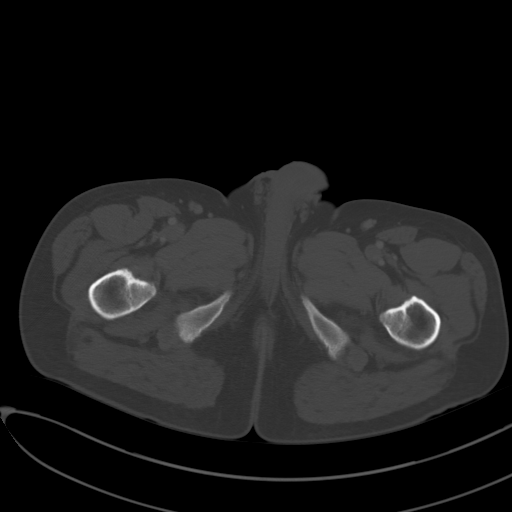
[im 13/86  soft-tissue]
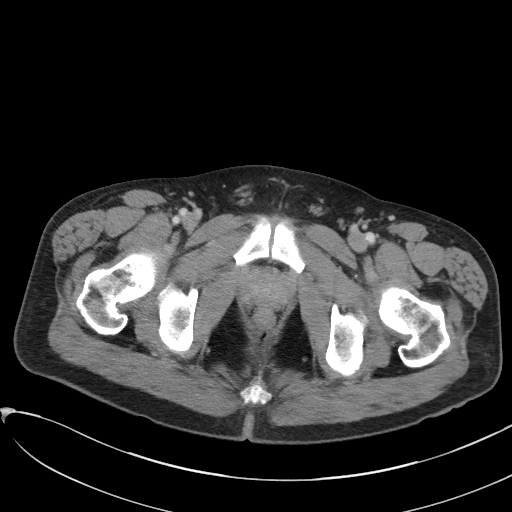
[im 17/86  soft-tissue]
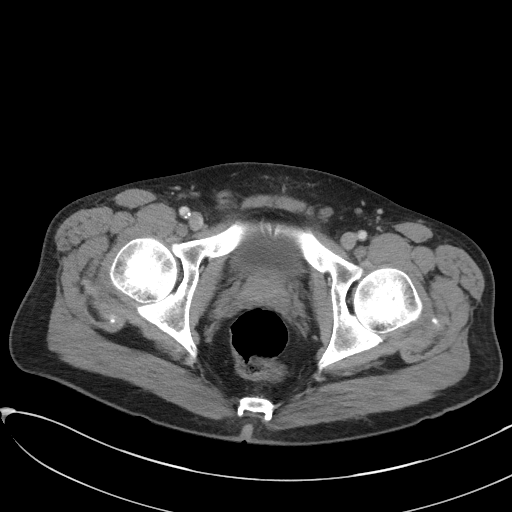
[im 25/86  soft-tissue]
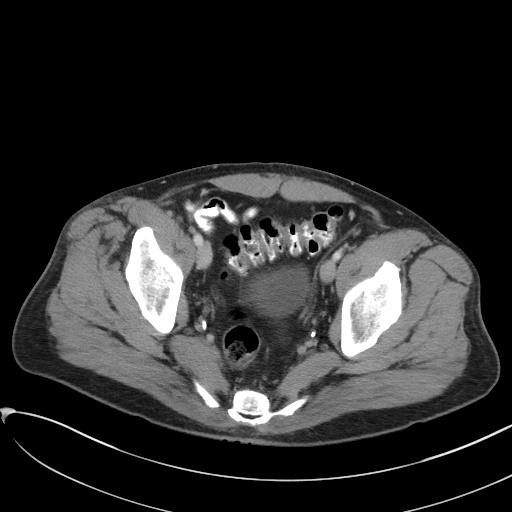
[im 29/86  soft-tissue]
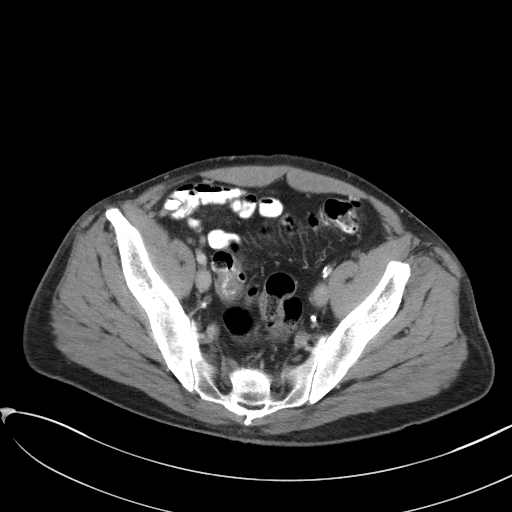
[im 37/86  soft-tissue]
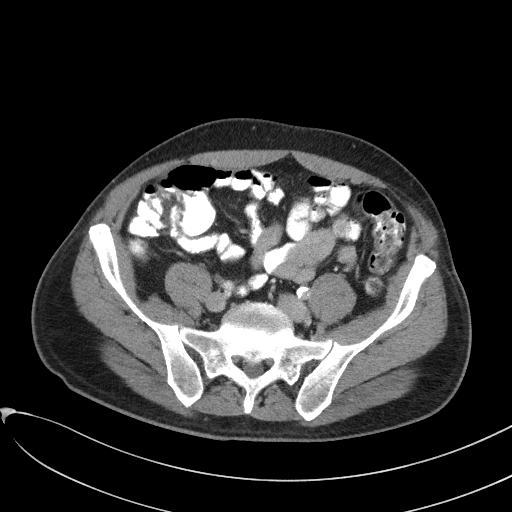
[im 45/86  soft-tissue]
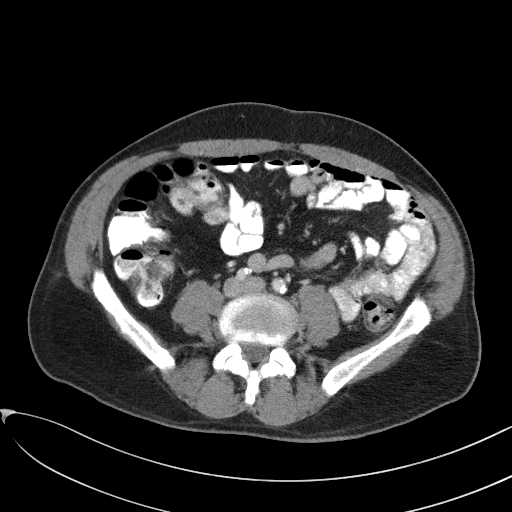
[im 49/86  soft-tissue]
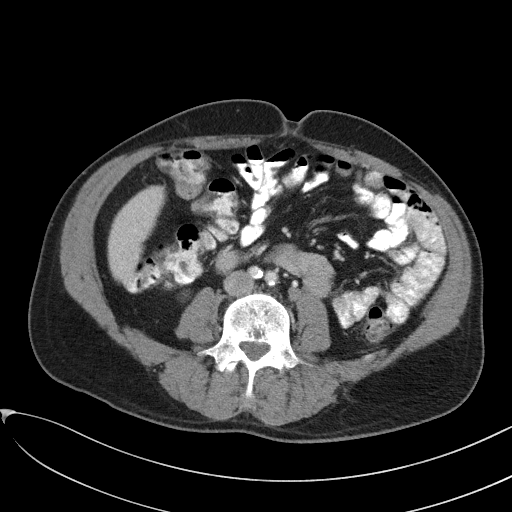
[im 57/86  soft-tissue]
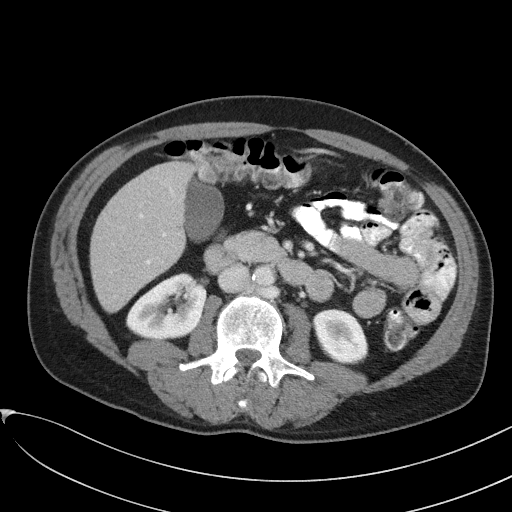
[im 57/86  bone]
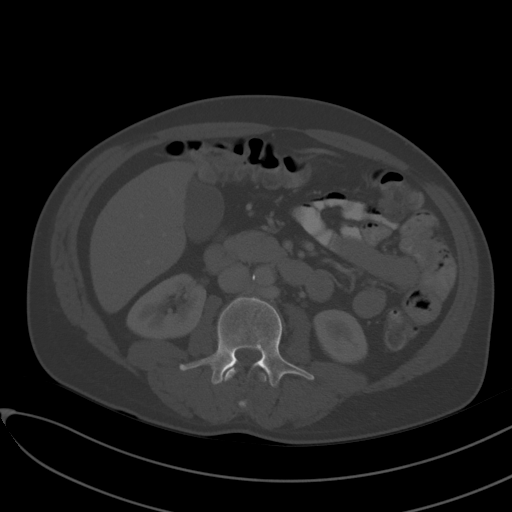
[im 61/86  soft-tissue]
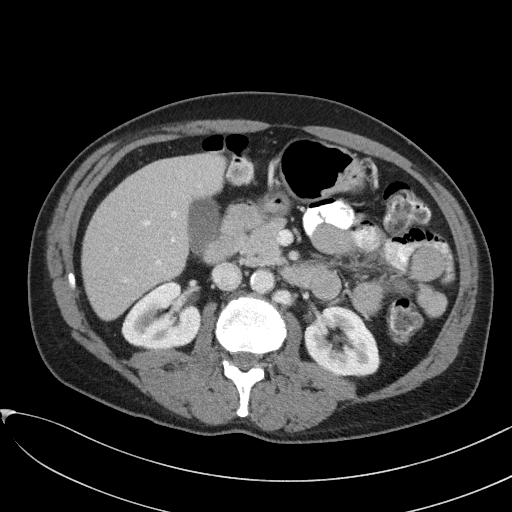
[im 69/86  soft-tissue]
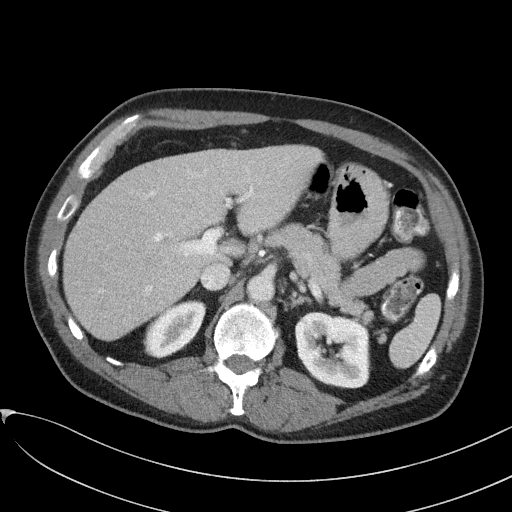
[im 73/86  soft-tissue]
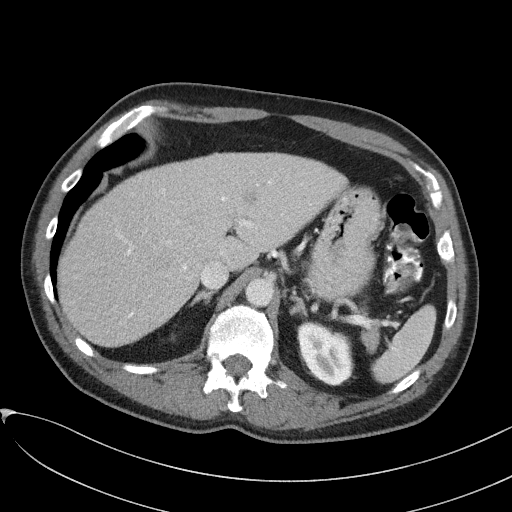
[im 81/86  soft-tissue]
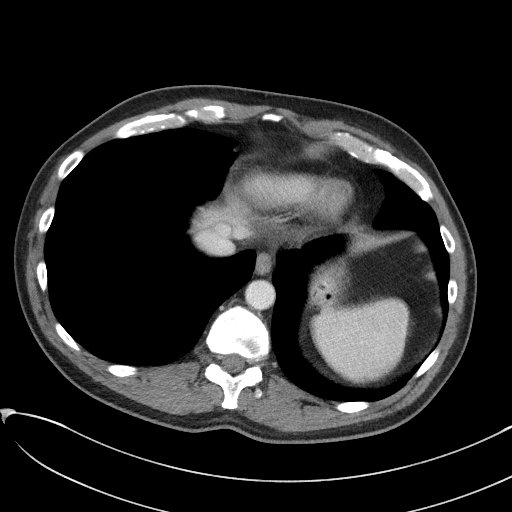

[Series 7: coronal st · coronal · 0.75mm/px · 3 of 108 slices shown]
[im 36/108  soft-tissue]
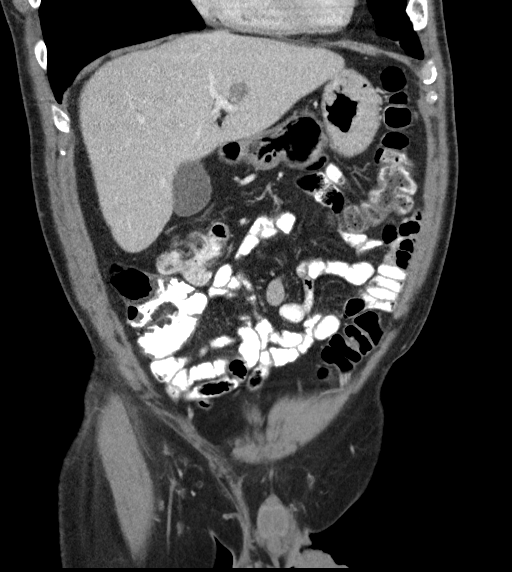
[im 48/108  soft-tissue]
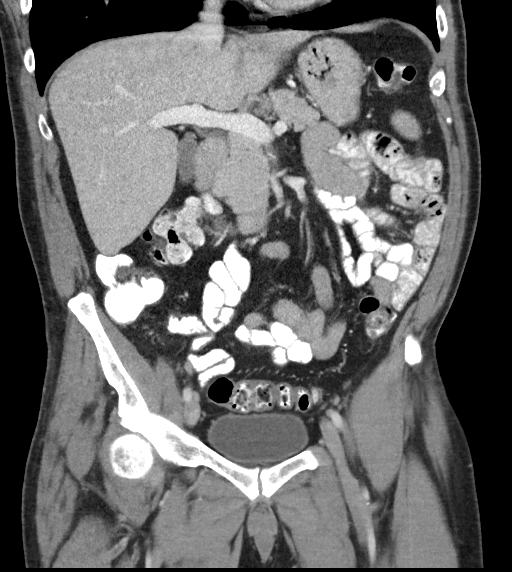
[im 60/108  soft-tissue]
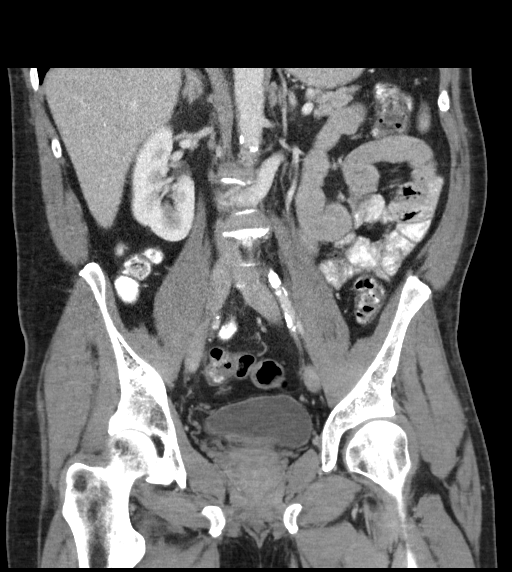

[16 of 46 positions shown; findings below may reference images not displayed]

FINDINGS: Lower chest: Negative

Hepatobiliary: 14 mm hypodense lesion in the left lobe liver, not
present previously. This has a density of approximately 50-60
Hounsfield units and is denser and less well-defined than a simple
cyst. Further evaluation is suggested to rule out neoplasm.
Remainder of the liver is normal. Gallbladder and bile ducts normal.

Pancreas: Negative

Spleen: Negative

Adrenals/Urinary Tract: Negative

Stomach/Bowel: Normal stomach. Normal small bowel. Negative for
bowel obstruction. No bowel mass or edema. Normal appendix. Mild
sigmoid diverticulosis without diverticulitis.

Vascular/Lymphatic: Atherosclerotic disease. Negative for aortic
aneurysm.

Reproductive: Negative

Other: No free-fluid

Musculoskeletal: Negative
IMPRESSION: 14 mm hypodense lesion left lobe of the liver, this does not appear
to be a simple cyst. This could be a complex cyst or neoplasm in the
liver. This was not present in [JR]. Ultrasound may be helpful for
further evaluation. If this is indeterminate, MRI of the liver
without with contrast suggested for further evaluation.

No cause for acute abdominal pain. Normal appendix. No renal
calculi.

These results will be called to the ordering clinician or
representative by the Radiologist Assistant, and communication
documented in the PACS or zVision Dashboard.

## 2016-06-15 MED ORDER — CYCLOBENZAPRINE HCL 10 MG PO TABS: 10.0000 mg | ORAL_TABLET | Freq: Three times a day (TID) | ORAL | 0 refills | Status: DC | PRN

## 2016-06-15 MED ORDER — PREDNISONE 20 MG PO TABS: ORAL_TABLET | ORAL | 0 refills | Status: DC

## 2016-06-15 MED ORDER — IOPAMIDOL (ISOVUE-300) INJECTION 61%
100.0000 mL | Freq: Once | INTRAVENOUS | Status: AC | PRN
  Administered 2016-06-15: 100 mL via INTRAVENOUS

## 2016-06-15 MED ORDER — IOPAMIDOL (ISOVUE-300) INJECTION 61%
INTRAVENOUS | Status: AC
  Administered 2016-06-15: 30 mL
  Filled 2016-06-15: qty 30

## 2016-06-15 NOTE — Telephone Encounter (Signed)
Left voicemail for Joseph Atkins informing her of order for ultrasound

## 2016-06-15 NOTE — Telephone Encounter (Signed)
Spoke with the patient directly about the results and ordered ultrasound, patient aware and understands, please let Jackelyn Poling know that we have ordered the ultrasound

## 2016-06-15 NOTE — Telephone Encounter (Signed)
Call report came in on CT of abdomen and pelvis.  Please review and advise .

## 2016-06-15 NOTE — Progress Notes (Signed)
BP (!) 143/76   Pulse 69   Temp 97 F (36.1 C) (Oral)   Ht 5' 7.25" (1.708 m)   Wt 176 lb 12.8 oz (80.2 kg)   BMI 27.49 kg/m    Subjective:    Patient ID: Joseph Atkins, male    DOB: 1960/06/23, 56 y.o.   MRN: [id number]  HPI: Joseph Atkins is a 56 y.o. male presenting on 06/15/2016 for Back Pain (right side that goes down right hip. Patient states that it has been going on since Saturday.)   HPI Low back pain and right groin pain Patient has right low back pain and right groin/lower abdomen pain. He says he initially started 3 days ago with right low back pain and then yesterday developed the pain going into his right lower abdomen and groin. He denies any fevers or chills but did have some sweats associated with the pain yesterday. He denies any dysuria but has had darker urine. He denies any hematuria. His back pain has been intense and he rates it as an 8 out of 10 and he was unable to work today because of how bad it was hurting. 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 He denies any nausea or vomiting or decreased appetite. He denies any diarrhea or constipation or lung in his stool.  Relevant past medical, surgical, family and social history reviewed and updated as indicated. Interim medical history since our last visit reviewed. Allergies and medications reviewed and updated.  Review of Systems  Constitutional: Negative for chills and fever.  Respiratory: Negative for shortness of breath and wheezing.   Cardiovascular: Negative for chest pain and leg swelling.  Gastrointestinal: Positive for abdominal pain. Negative for blood in stool, constipation, diarrhea, nausea and vomiting.  Genitourinary: Negative for difficulty urinating, dysuria, frequency, hematuria and urgency.  Musculoskeletal: Positive for back pain. Negative for gait problem.  Skin: Negative for rash.  Neurological: Negative for  dizziness, weakness, light-headedness and numbness.  All other systems reviewed and are negative.   Per HPI unless specifically indicated above   Allergies as of 06/15/2016      Reactions   Penicillins       Medication List       Accurate as of 06/15/16  9:21 AM. Always use your most recent med list.          omeprazole 40 MG capsule Commonly known as:  PRILOSEC Take 1 capsule (40 mg total) by mouth daily.   polyethylene glycol powder powder Commonly known as:  MIRALAX Take 17 g by mouth daily.          Objective:    BP (!) 143/76   Pulse 69   Temp 97 F (36.1 C) (Oral)   Ht 5' 7.25" (1.708 m)   Wt 176 lb 12.8 oz (80.2 kg)   BMI 27.49 kg/m   Wt Readings from Last 3 Encounters:  06/15/16 176 lb 12.8 oz (80.2 kg)  01/15/15 169 lb 9.6 oz (76.9 kg)  01/08/15 171 lb 6.4 oz (77.7 kg)    Physical Exam  Constitutional: He is oriented to person, place, and time. He appears well-developed and well-nourished. No distress.  Eyes: Conjunctivae are normal. Right eye exhibits no discharge. Left eye exhibits no discharge. No scleral icterus.  Cardiovascular: Normal rate, regular rhythm, normal heart sounds and intact distal pulses.   No murmur heard. Pulmonary/Chest: Effort normal  and breath sounds normal. No respiratory distress. He has no wheezes. He has no rales.  Abdominal: Soft. He exhibits no distension. There is tenderness in the right lower quadrant. There is no rigidity, no rebound and no guarding. No hernia.  Musculoskeletal: Normal range of motion. He exhibits no edema.       Lumbar back: He exhibits tenderness (Right lower back tenderness near the right lower SI joint) and spasm. He exhibits normal range of motion, no bony tenderness, no swelling and normal pulse.  Neurological: He is alert and oriented to person, place, and time. Coordination normal.  Skin: Skin is warm and dry. No rash noted. He is not diaphoretic.  Psychiatric: He has a normal mood and affect.  His behavior is normal.  Nursing note and vitals reviewed.   Urinalysis: Completely normal urinalysis    Assessment & Plan:   Problem List Items Addressed This Visit    None    Visit Diagnoses    Right lower quadrant abdominal pain    -  Primary   Relevant Orders   Urinalysis, Complete   CT Abdomen Pelvis W Contrast   Acute right-sided low back pain without sciatica       Relevant Medications   predniSONE (DELTASONE) 20 MG tablet   Other Relevant Orders   Urinalysis, Complete   CT Abdomen Pelvis W Contrast       Follow up plan: Return if symptoms worsen or fail to improve.  Counseling provided for all of the vaccine components Orders Placed This Encounter  Procedures  . Urinalysis, Complete    Caryl Pina, MD [facility] 06/15/2016, 9:21 AM

## 2016-06-17 ENCOUNTER — Ambulatory Visit (HOSPITAL_COMMUNITY)
Admission: RE | Admit: 2016-06-17 | Discharge: 2016-06-17 | Disposition: A | Discharge status: Home / Self Care | Payer: BLUE CROSS/BLUE SHIELD | Source: Ambulatory Visit | Attending: Family Medicine | Admitting: Family Medicine

## 2016-06-17 DIAGNOSIS — K769 Liver disease, unspecified: Secondary | ICD-10-CM | POA: Insufficient documentation

## 2016-06-18 NOTE — Telephone Encounter (Signed)
Ultrasound abdomen was ordered because the CT scan noted a spot on his liver

## 2017-01-31 ENCOUNTER — Emergency Department (HOSPITAL_COMMUNITY): Payer: BLUE CROSS/BLUE SHIELD

## 2017-01-31 ENCOUNTER — Encounter (HOSPITAL_COMMUNITY): Payer: Self-pay | Admitting: Emergency Medicine

## 2017-01-31 ENCOUNTER — Encounter: Payer: Self-pay | Admitting: Family

## 2017-01-31 ENCOUNTER — Ambulatory Visit (INDEPENDENT_AMBULATORY_CARE_PROVIDER_SITE_OTHER): Payer: BLUE CROSS/BLUE SHIELD

## 2017-01-31 ENCOUNTER — Observation Stay (HOSPITAL_COMMUNITY)
Admission: EM | Admit: 2017-01-31 | Discharge: 2017-02-01 | Disposition: A | Discharge status: Home / Self Care | Payer: BLUE CROSS/BLUE SHIELD | Attending: Internal Medicine | Admitting: Internal Medicine

## 2017-01-31 ENCOUNTER — Ambulatory Visit (INDEPENDENT_AMBULATORY_CARE_PROVIDER_SITE_OTHER): Payer: BLUE CROSS/BLUE SHIELD | Admitting: Family

## 2017-01-31 VITALS — BP 129/88 | HR 80 | Temp 97.0°F | Resp 28 | Ht 68.0 in | Wt 157.4 lb

## 2017-01-31 DIAGNOSIS — E785 Hyperlipidemia, unspecified: Secondary | ICD-10-CM | POA: Insufficient documentation

## 2017-01-31 DIAGNOSIS — Z23 Encounter for immunization: Secondary | ICD-10-CM | POA: Insufficient documentation

## 2017-01-31 DIAGNOSIS — R0602 Shortness of breath: Secondary | ICD-10-CM

## 2017-01-31 DIAGNOSIS — K219 Gastro-esophageal reflux disease without esophagitis: Secondary | ICD-10-CM | POA: Diagnosis present

## 2017-01-31 DIAGNOSIS — J441 Chronic obstructive pulmonary disease with (acute) exacerbation: Principal | ICD-10-CM | POA: Insufficient documentation

## 2017-01-31 DIAGNOSIS — J209 Acute bronchitis, unspecified: Secondary | ICD-10-CM | POA: Diagnosis not present

## 2017-01-31 DIAGNOSIS — F17210 Nicotine dependence, cigarettes, uncomplicated: Secondary | ICD-10-CM | POA: Diagnosis not present

## 2017-01-31 DIAGNOSIS — Z79899 Other long term (current) drug therapy: Secondary | ICD-10-CM | POA: Insufficient documentation

## 2017-01-31 DIAGNOSIS — F17200 Nicotine dependence, unspecified, uncomplicated: Secondary | ICD-10-CM | POA: Diagnosis not present

## 2017-01-31 DIAGNOSIS — R0682 Tachypnea, not elsewhere classified: Secondary | ICD-10-CM | POA: Diagnosis not present

## 2017-01-31 LAB — CBC WITH DIFFERENTIAL/PLATELET
Basophils Absolute: 0.1 10*3/uL (ref 0.0–0.1)
Basophils Relative: 1 %
Eosinophils Absolute: 0.3 10*3/uL (ref 0.0–0.7)
Eosinophils Relative: 4 %
HCT: 42.5 % (ref 39.0–52.0)
Hemoglobin: 14.3 g/dL (ref 13.0–17.0)
Lymphocytes Relative: 24 %
Lymphs Abs: 1.8 10*3/uL (ref 0.7–4.0)
MCH: 31 pg (ref 26.0–34.0)
MCHC: 33.6 g/dL (ref 30.0–36.0)
MCV: 92 fL (ref 78.0–100.0)
Monocytes Absolute: 0.7 10*3/uL (ref 0.1–1.0)
Monocytes Relative: 10 %
Neutro Abs: 4.5 10*3/uL (ref 1.7–7.7)
Neutrophils Relative %: 61 %
Platelets: 192 10*3/uL (ref 150–400)
RBC: 4.62 MIL/uL (ref 4.22–5.81)
RDW: 13.6 % (ref 11.5–15.5)
WBC: 7.4 10*3/uL (ref 4.0–10.5)

## 2017-01-31 LAB — COMPREHENSIVE METABOLIC PANEL
ALT: 21 U/L (ref 17–63)
AST: 22 U/L (ref 15–41)
Albumin: 3.9 g/dL (ref 3.5–5.0)
Alkaline Phosphatase: 55 U/L (ref 38–126)
Anion gap: 9 (ref 5–15)
BUN: 7 mg/dL (ref 6–20)
CO2: 26 mmol/L (ref 22–32)
Calcium: 8.9 mg/dL (ref 8.9–10.3)
Chloride: 101 mmol/L (ref 101–111)
Creatinine, Ser: 0.9 mg/dL (ref 0.61–1.24)
GFR calc Af Amer: >60 mL/min (ref >60)
GFR calc non Af Amer: >60 mL/min (ref >60)
Glucose, Bld: 153 mg/dL — ABNORMAL HIGH (ref 65–99)
Potassium: 3.5 mmol/L (ref 3.5–5.1)
Sodium: 136 mmol/L (ref 135–145)
Total Bilirubin: 0.5 mg/dL (ref 0.3–1.2)
Total Protein: 6.7 g/dL (ref 6.5–8.1)

## 2017-01-31 LAB — TROPONIN I: Troponin I: <0.03 ng/mL (ref <0.03)

## 2017-01-31 IMAGING — DX DG CHEST 2V
2 series · 2 of 2 positions shown · non-contrast
Comparison: [DATE]

CLINICAL DATA: Cough.  Shortness of breath .

EXAM:
CHEST  2 VIEW

[chest pa]
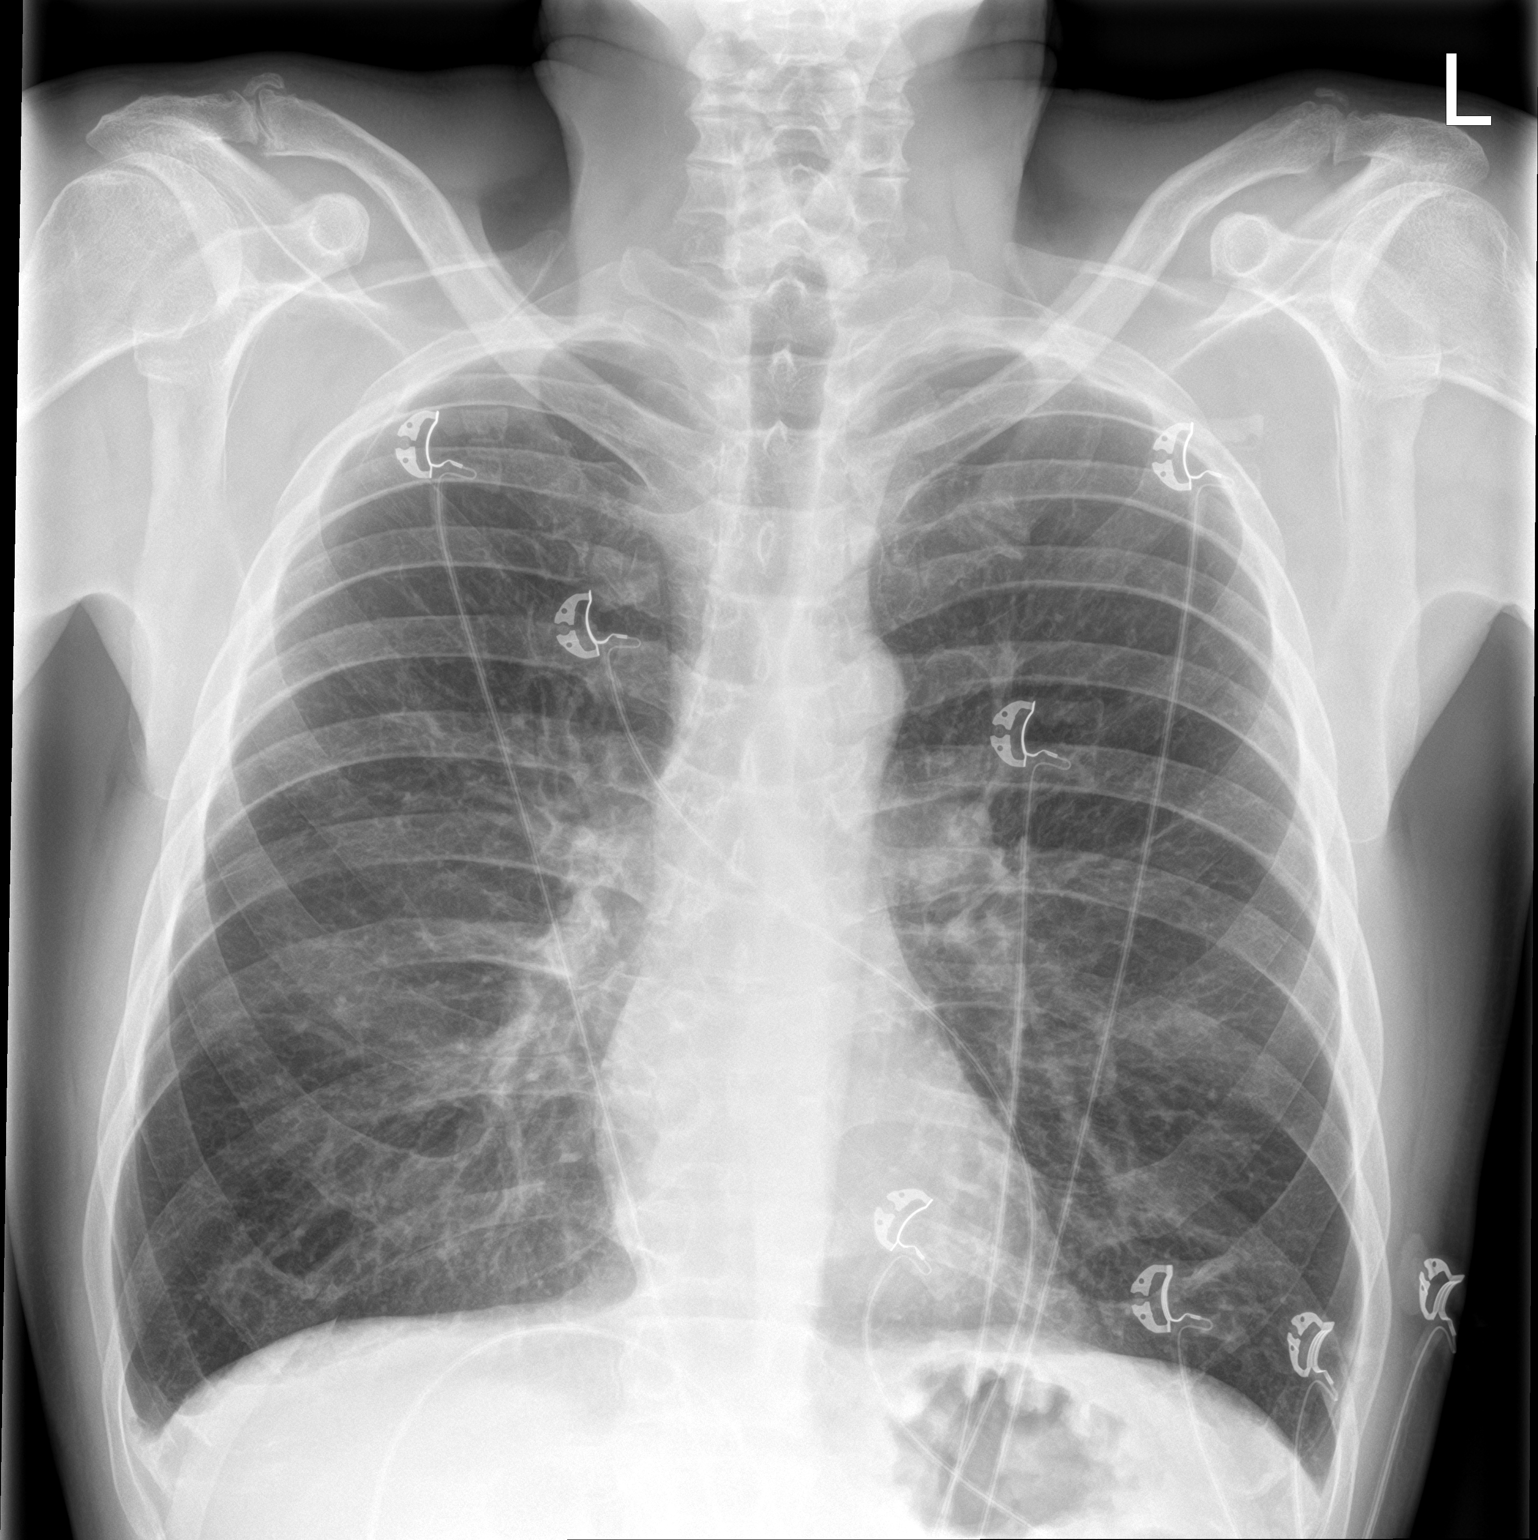

[chest lat]
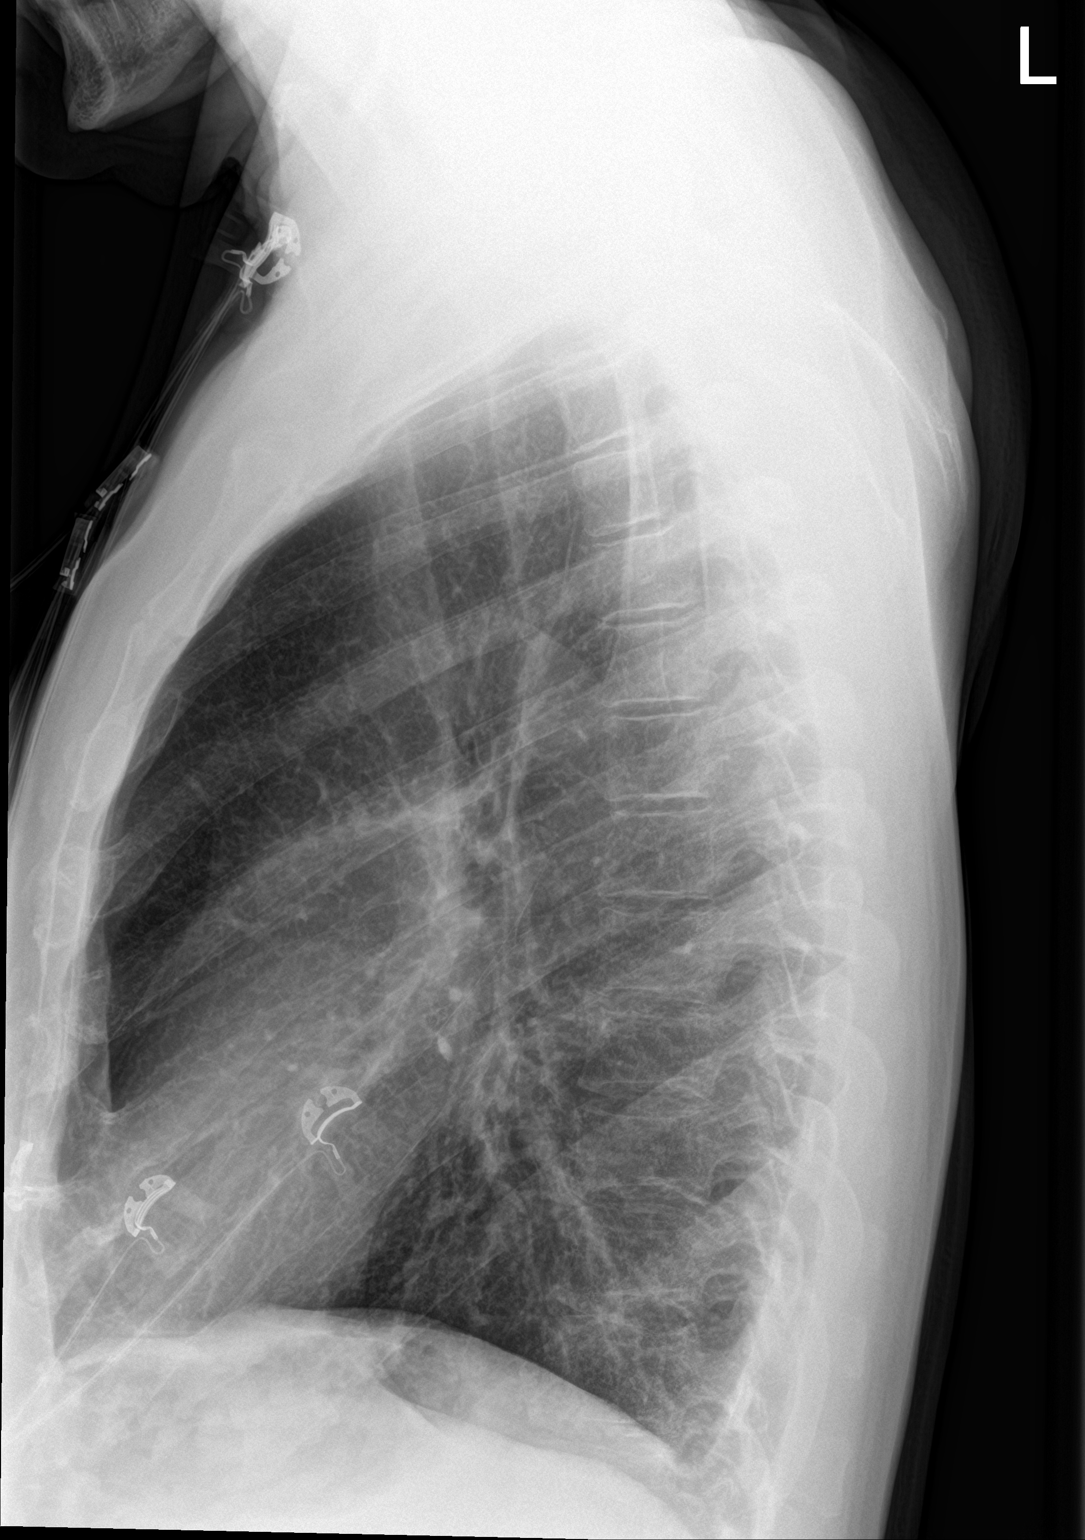

[2 of 2 positions shown; findings below may reference images not displayed]

FINDINGS: Mediastinum hilar structures normal. Very mild increase interstitial
markings. Small right pleural effusion. No pneumothorax. Heart size
normal. No acute bony abnormality .
IMPRESSION: Very mild increase in interstitial prominence. Mild pneumonitis
cannot be excluded. No focal alveolar infiltrate. Small right
pleural effusion.

## 2017-01-31 IMAGING — DX DG CHEST 2V
2 series · 2 of 2 positions shown · non-contrast
Comparison: [DATE]

CLINICAL DATA: Shortness of breath.  Smoking history.

EXAM:
CHEST  2 VIEW

[chest pa]
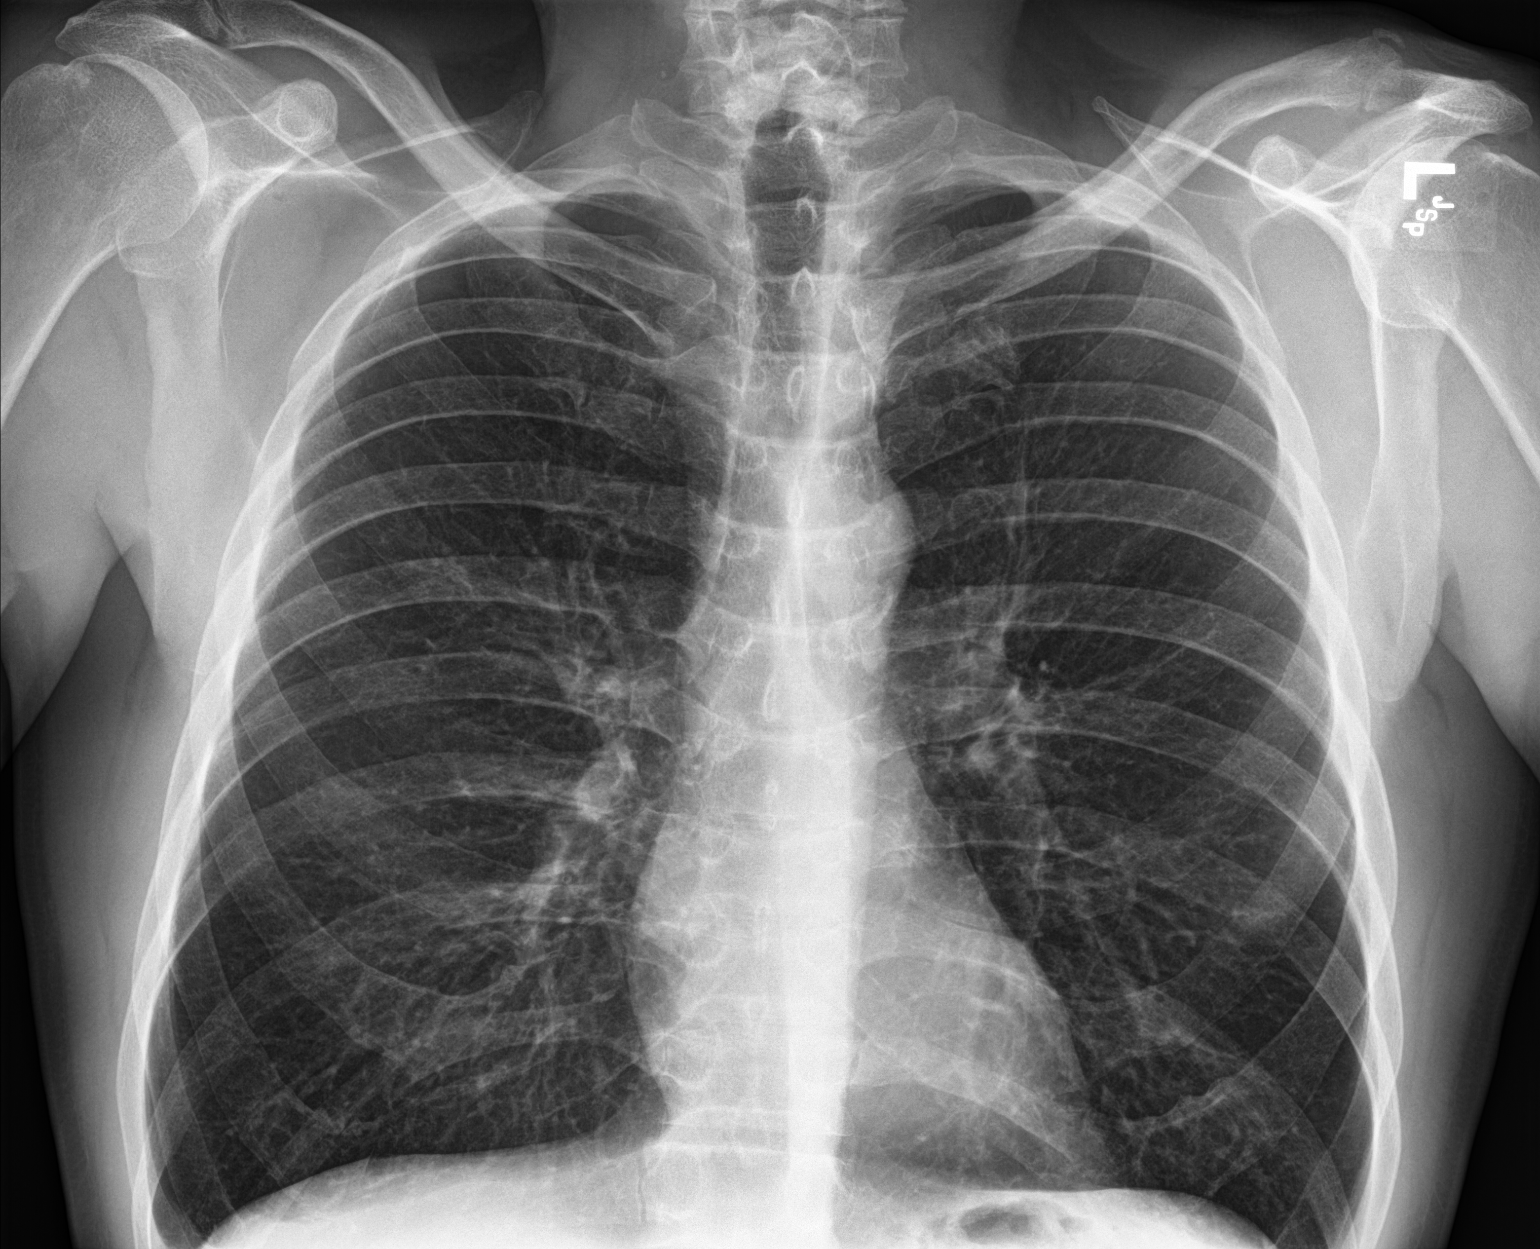

[chest lat]
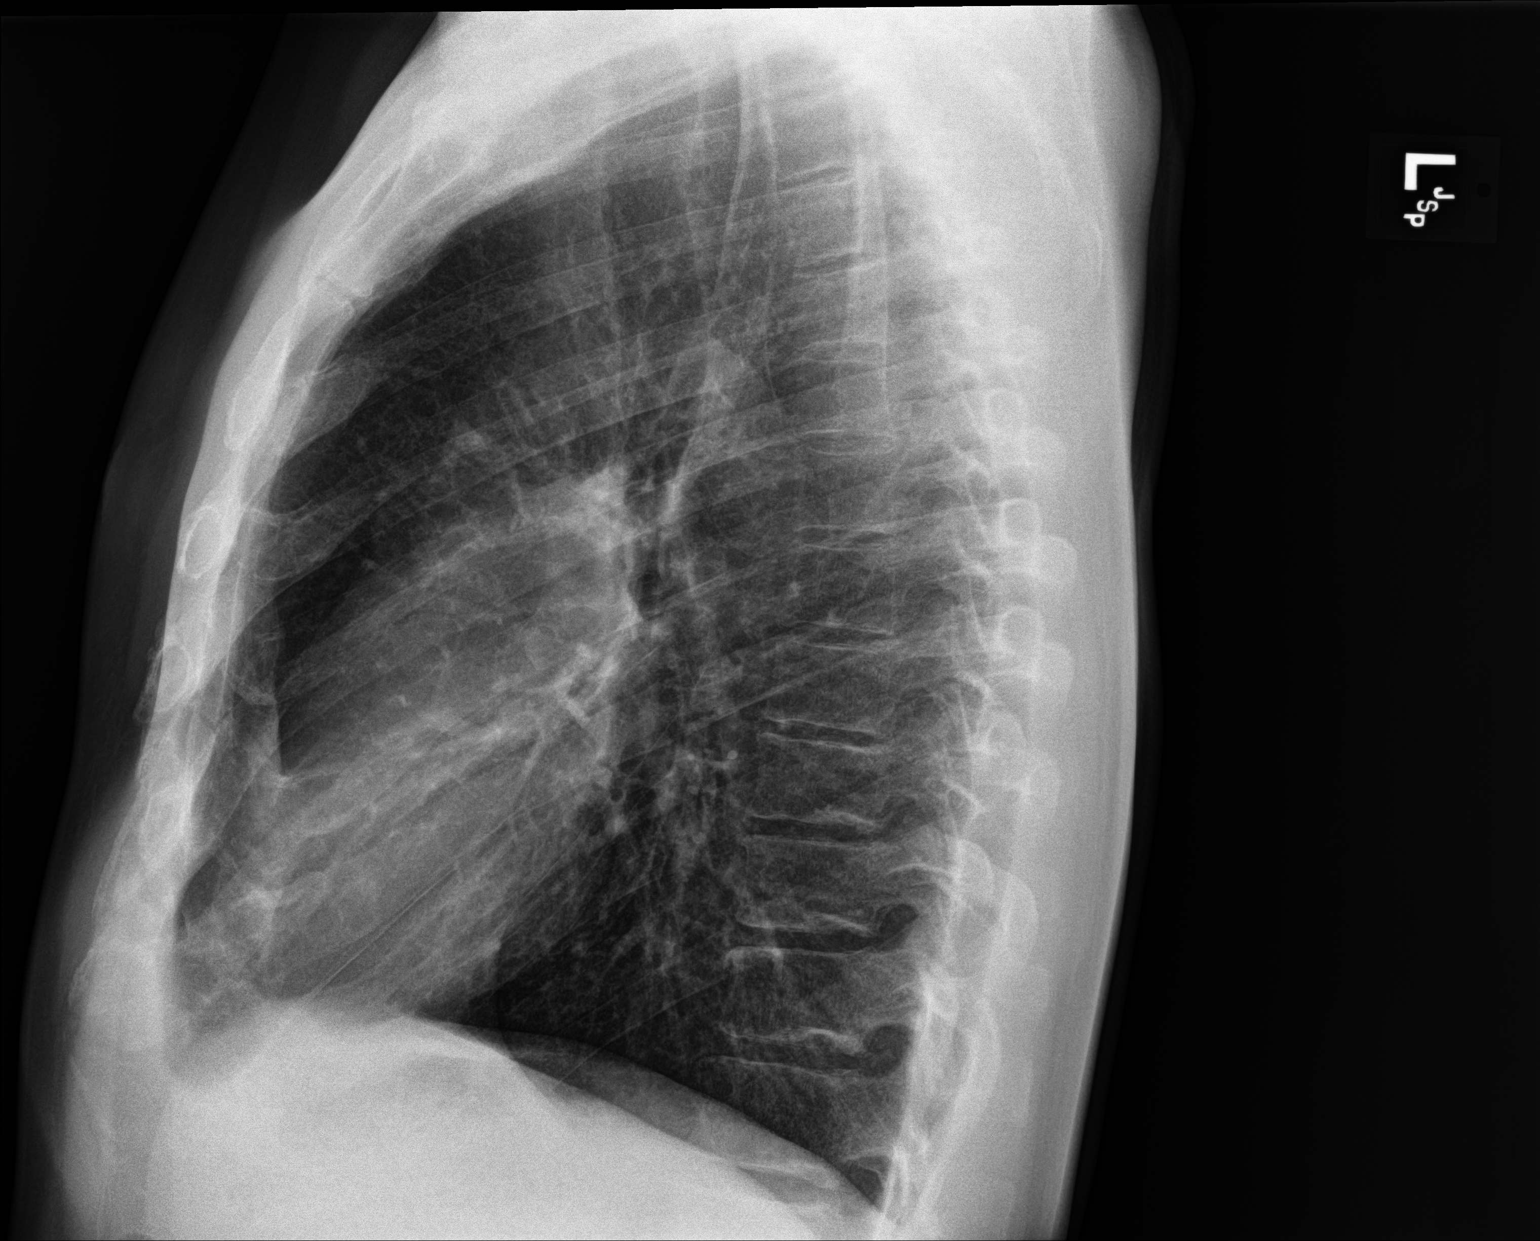

[2 of 2 positions shown; findings below may reference images not displayed]

FINDINGS: Heart size is normal. Mediastinal shadows are normal. The lungs
appear hyperinflated but there is no sign of infiltrate, mass,
effusion or collapse. No acute bone finding.
IMPRESSION: Pulmonary hyperinflation.  No infiltrate, collapse or effusion.

## 2017-01-31 MED ORDER — ENOXAPARIN SODIUM 40 MG/0.4ML ~~LOC~~ SOLN
40.0000 mg | SUBCUTANEOUS | Status: DC
  Administered 2017-01-31: 40 mg via SUBCUTANEOUS
  Filled 2017-01-31 (×2): qty 0.4

## 2017-01-31 MED ORDER — DEXTROSE 5 % IV SOLN
500.0000 mg | INTRAVENOUS | Status: DC
  Administered 2017-01-31: 500 mg via INTRAVENOUS
  Filled 2017-01-31 (×2): qty 500

## 2017-01-31 MED ORDER — NICOTINE 21 MG/24HR TD PT24
21.0000 mg | MEDICATED_PATCH | Freq: Every day | TRANSDERMAL | Status: DC
  Administered 2017-01-31 – 2017-02-01 (×2): 21 mg via TRANSDERMAL
  Filled 2017-01-31 (×2): qty 1

## 2017-01-31 MED ORDER — IPRATROPIUM-ALBUTEROL 0.5-2.5 (3) MG/3ML IN SOLN
3.0000 mL | Freq: Once | RESPIRATORY_TRACT | Status: AC
  Administered 2017-01-31: 3 mL via RESPIRATORY_TRACT
  Filled 2017-01-31: qty 3

## 2017-01-31 MED ORDER — MAGNESIUM SULFATE 2 GM/50ML IV SOLN
2.0000 g | Freq: Once | INTRAVENOUS | Status: AC
  Administered 2017-01-31: 2 g via INTRAVENOUS
  Filled 2017-01-31: qty 50

## 2017-01-31 MED ORDER — ALBUTEROL SULFATE (2.5 MG/3ML) 0.083% IN NEBU
2.5000 mg | INHALATION_SOLUTION | Freq: Once | RESPIRATORY_TRACT | Status: AC
  Administered 2017-01-31: 2.5 mg via RESPIRATORY_TRACT
  Filled 2017-01-31: qty 3

## 2017-01-31 MED ORDER — IPRATROPIUM BROMIDE 0.02 % IN SOLN: 0.5000 mg | Freq: Four times a day (QID) | RESPIRATORY_TRACT | Status: DC

## 2017-01-31 MED ORDER — ALBUTEROL SULFATE (2.5 MG/3ML) 0.083% IN NEBU: 2.5000 mg | INHALATION_SOLUTION | Freq: Four times a day (QID) | RESPIRATORY_TRACT | Status: DC

## 2017-01-31 MED ORDER — IPRATROPIUM-ALBUTEROL 0.5-2.5 (3) MG/3ML IN SOLN
3.0000 mL | Freq: Four times a day (QID) | RESPIRATORY_TRACT | Status: DC
  Administered 2017-01-31: 3 mL via RESPIRATORY_TRACT
  Filled 2017-01-31: qty 3

## 2017-01-31 MED ORDER — METHYLPREDNISOLONE SODIUM SUCC 125 MG IJ SOLR
125.0000 mg | Freq: Once | INTRAMUSCULAR | Status: AC
Start: 2017-01-31 — End: 2017-01-31
  Administered 2017-01-31: 125 mg via INTRAVENOUS
  Filled 2017-01-31: qty 2

## 2017-01-31 MED ORDER — PNEUMOCOCCAL VAC POLYVALENT 25 MCG/0.5ML IJ INJ
0.5000 mL | INJECTION | INTRAMUSCULAR | Status: AC
  Administered 2017-02-01: 0.5 mL via INTRAMUSCULAR
  Filled 2017-01-31: qty 0.5

## 2017-01-31 MED ORDER — PANTOPRAZOLE SODIUM 40 MG PO TBEC
40.0000 mg | DELAYED_RELEASE_TABLET | Freq: Every day | ORAL | Status: DC
  Administered 2017-01-31 – 2017-02-01 (×2): 40 mg via ORAL
  Filled 2017-01-31 (×2): qty 1

## 2017-01-31 MED ORDER — METHYLPREDNISOLONE SODIUM SUCC 40 MG IJ SOLR
40.0000 mg | Freq: Four times a day (QID) | INTRAMUSCULAR | Status: DC
  Administered 2017-01-31 – 2017-02-01 (×3): 40 mg via INTRAVENOUS
  Filled 2017-01-31 (×3): qty 1

## 2017-01-31 MED ORDER — IPRATROPIUM-ALBUTEROL 0.5-2.5 (3) MG/3ML IN SOLN
3.0000 mL | Freq: Three times a day (TID) | RESPIRATORY_TRACT | Status: DC
  Administered 2017-02-01: 3 mL via RESPIRATORY_TRACT
  Filled 2017-01-31 (×2): qty 3

## 2017-01-31 MED ORDER — SODIUM CHLORIDE 0.9 % IV SOLN
INTRAVENOUS | Status: DC
  Administered 2017-01-31: 23:00:00 via INTRAVENOUS

## 2017-01-31 MED ORDER — ALBUTEROL SULFATE (2.5 MG/3ML) 0.083% IN NEBU: 2.5000 mg | INHALATION_SOLUTION | Freq: Four times a day (QID) | RESPIRATORY_TRACT | Status: DC | PRN

## 2017-01-31 MED ORDER — DOCUSATE SODIUM 100 MG PO CAPS
100.0000 mg | ORAL_CAPSULE | Freq: Every day | ORAL | Status: DC
  Administered 2017-02-01: 100 mg via ORAL
  Filled 2017-01-31 (×2): qty 1

## 2017-01-31 MED ORDER — INFLUENZA VAC SPLIT QUAD 0.5 ML IM SUSY
0.5000 mL | PREFILLED_SYRINGE | INTRAMUSCULAR | Status: AC
  Administered 2017-02-01: 0.5 mL via INTRAMUSCULAR
  Filled 2017-01-31: qty 0.5

## 2017-01-31 MED ORDER — POLYETHYLENE GLYCOL 3350 17 G PO PACK
17.0000 g | PACK | Freq: Every day | ORAL | Status: DC
  Administered 2017-02-01: 17 g via ORAL
  Filled 2017-01-31: qty 1

## 2017-01-31 NOTE — Patient Instructions (Signed)
Shortness of Breath, Adult Shortness of breath is when a person has trouble breathing enough air, or when a person feels like she or he is having trouble breathing in enough air. Shortness of breath could be a sign of medical problem. Follow these instructions at home: Pay attention to any changes in your symptoms. Take these actions to help with your condition:  Do not smoke. Smoking is a common cause of shortness of breath. If you smoke and you need help quitting, ask your health care provider.  Avoid things that can irritate your airways, such as: ? Mold. ? Dust. ? Air pollution. ? Chemical fumes. ? Things that can cause allergy symptoms (allergens), if you have allergies.  Keep your living space clean and free of mold and dust.  Rest as needed. Slowly return to your usual activities.  Take over-the-counter and prescription medicines, including oxygen and inhaled medicines, only as told by your health care provider.  Keep all follow-up visits as told by your health care provider. This is important.  Contact a health care provider if:  Your condition does not improve as soon as expected.  You have a hard time doing your normal activities, even after you rest.  You have new symptoms. Get help right away if:  Your shortness of breath gets worse.  You have shortness of breath when you are resting.  You feel light-headed or you faint.  You have a cough that is not controlled with medicines.  You cough up blood.  You have pain with breathing.  You have pain in your chest, arms, shoulders, or abdomen.  You have a fever.  You cannot walk up stairs or exercise the way that you normally do. This information is not intended to replace advice given to you by your health care provider. Make sure you discuss any questions you have with your health care provider. Document Released: 01/04/2001 Document Revised: 10/31/2015 Document Reviewed: 09/17/2015 Elsevier Interactive Patient  Education  2018 [employer]  

## 2017-01-31 NOTE — H&P (Signed)
TRH H&P    Patient Demographics:    Joseph Atkins, is a 56 y.o. male  MRN: [id number]  DOB - [birth year]  Admit Date - 01/31/2017  Referring MD/NP/PA: Dr Roderic Palau  Outpatient Primary MD for the patient is Dettinger, Fransisca Kaufmann, MD  Patient coming from: Home  Chief Complaint  Patient presents with  . Shortness of Breath      HPI:    Joseph Atkins  is a 56 y.o. male, with no significant medical problems came to hospital with worsening shortness of breath for past four days. Patient says it started with runny nose, headaches. He says he felt warm on Sunday. He denies Coughing up any phlegm. Denies chest pain. No nausea vomiting or diarrhea. Patient has history of tobacco use.  In the ED, chest x-ray showed very mild increase in interstitial prominence, mild pneumonitis cannot be excluded. Small right pleural effusion.     Review of systems:    In addition to the HPI above,   All other systems reviewed and are negative.   With Past History of the following :    Past Medical History:  Diagnosis Date  . Diverticulosis   . GERD (gastroesophageal reflux disease)   . Hx of small bowel obstruction   . Hyperlipidemia   . IBS (irritable bowel syndrome)   . Substance abuse (HCC)    Alcoholic, Drug addition  . Thyroid disease       Past Surgical History:  Procedure Laterality Date  . arm surgery Right   . FINGER SURGERY Right    Middle      Social History:      Social History  Substance Use Topics  . Smoking status: Current Every Day Smoker    Packs/day: 2.00    Types: Cigarettes  . Smokeless tobacco: Current User    Types: Chew  . Alcohol use No     Comment: Alcoholic clean for 6 years       Family History :     Family History  Problem Relation Age of Onset  . Epilepsy Mother   . Heart disease Mother   . Heart disease Brother   . Lung cancer Paternal Uncle       Home  Medications:   Prior to Admission medications   Medication Sig Start Date End Date Taking? Authorizing Provider  docusate sodium (COLACE) 100 MG capsule Take 100 mg by mouth daily.   Yes [provider]  ibuprofen (ADVIL,MOTRIN) 200 MG tablet Take 800 mg by mouth every 6 (six) hours as needed for moderate pain.   Yes [provider]  omeprazole (PRILOSEC OTC) 20 MG tablet Take 20 mg by mouth daily.   Yes [provider]  omeprazole (PRILOSEC) 40 MG capsule Take 1 capsule (40 mg total) by mouth daily. 09/15/14  Yes Hawks, Christy A, FNP  polyethylene glycol powder (MIRALAX) powder Take 17 g by mouth daily. 05/09/14  Yes Lysbeth Penner, FNP     Allergies:     Allergies  Allergen Reactions  . Penicillins Other (  See Comments)    Childhood allergy.  Has patient had a PCN reaction causing immediate rash, facial/tongue/throat swelling, SOB or lightheadedness with hypotension: unknown Has patient had a PCN reaction causing severe rash involving mucus membranes or skin necrosis: unknown Has patient had a PCN reaction that required hospitalization: unknown Has patient had a PCN reaction occurring within the last 10 years: no If all of the above answers are "NO", then may proceed with Cephalosporin use.      Physical Exam:   Vitals  Blood pressure (!) 143/70, pulse 72, temperature 98 F (36.7 C), temperature source Temporal, resp. rate (!) 25, height 5\' 9"  (1.753 m), weight 71.2 kg (157 lb), SpO2 94 %.  1.  General: Appears in no acute distress  2. Psychiatric:  Intact judgement and  insight, awake alert, oriented x 3.  3. Neurologic: No focal neurological deficits, all cranial nerves intact.Strength 5/5 all 4 extremities, sensation intact all 4 extremities, plantars down going.  4. Eyes :  anicteric sclerae, moist conjunctivae with no lid lag. PERRLA.  5. ENMT:  Oropharynx clear with moist mucous membranes and good dentition  6. Neck:  supple, no  cervical lymphadenopathy appriciated, No thyromegaly  7. Respiratory : Normal respiratory effort, scattered rhonchi bilaterally  8. Cardiovascular : RRR, no gallops, rubs or murmurs, no leg edema  9. Gastrointestinal:  Positive bowel sounds, abdomen soft, non-tender to palpation,no hepatosplenomegaly, no rigidity or guarding       10. Skin:  No cyanosis, normal texture and turgor, no rash, lesions or ulcers  11.Musculoskeletal:  Good muscle tone,  joints appear normal , no effusions,  normal range of motion    Data Review:    CBC  Recent Labs Lab 01/31/17 1136  WBC 7.4  HGB 14.3  HCT 42.5  PLT 192  MCV 92.0  MCH 31.0  MCHC 33.6  RDW 13.6  LYMPHSABS 1.8  MONOABS 0.7  EOSABS 0.3  BASOSABS 0.1   ------------------------------------------------------------------------------------------------------------------  Chemistries   Recent Labs Lab 01/31/17 1136  NA 136  K 3.5  CL 101  CO2 26  GLUCOSE 153*  BUN 7  CREATININE 0.90  CALCIUM 8.9  AST 22  ALT 21  ALKPHOS 55  BILITOT 0.5   ------------------------------------------------------------------------------------------------------------------  ------------------------------------------------------------------------------------------------------------------ GFR: Estimated Creatinine Clearance: 92.7 mL/min (by C-G formula based on SCr of 0.9 mg/dL). Liver Function Tests:  Recent Labs Lab 01/31/17 1136  AST 22  ALT 21  ALKPHOS 55  BILITOT 0.5  PROT 6.7  ALBUMIN 3.9   No results for input(s): LIPASE, AMYLASE in the last 168 hours. No results for input(s): AMMONIA in the last 168 hours. Coagulation Profile: No results for input(s): INR, PROTIME in the last 168 hours. Cardiac Enzymes:  Recent Labs Lab 01/31/17 1136  TROPONINI <0.03   BNP (last 3 results) No results for input(s): PROBNP in the last 8760 hours. HbA1C: No results for input(s): HGBA1C in the last 72 hours. CBG: No results for  input(s): GLUCAP in the last 168 hours. Lipid Profile: No results for input(s): CHOL, HDL, LDLCALC, TRIG, CHOLHDL, LDLDIRECT in the last 72 hours. Thyroid Function Tests: No results for input(s): TSH, T4TOTAL, FREET4, T3FREE, THYROIDAB in the last 72 hours. Anemia Panel: No results for input(s): VITAMINB12, FOLATE, FERRITIN, TIBC, IRON, RETICCTPCT in the last 72 hours.  --------------------------------------------------------------------------------------------------------------- Urine analysis:    Component Value Date/Time   APPEARANCEUR Clear 06/15/2016 0925   GLUCOSEU Negative 06/15/2016 0925   BILIRUBINUR Negative 06/15/2016 0925   PROTEINUR Negative 06/15/2016 0925  NITRITE Negative 06/15/2016 0925   LEUKOCYTESUR Negative 06/15/2016 0925      Imaging Results:    Dg Chest 2 View  Result Date: 01/31/2017 CLINICAL DATA:  Cough.  Shortness of breath . EXAM: CHEST  2 VIEW COMPARISON:  01/31/2017 FINDINGS: Mediastinum hilar structures normal. Very mild increase interstitial markings. Small right pleural effusion. No pneumothorax. Heart size normal. No acute bony abnormality . IMPRESSION: Very mild increase in interstitial prominence. Mild pneumonitis cannot be excluded. No focal alveolar infiltrate. Small right pleural effusion. Electronically Signed   By: Marcello Moores  Register   On: 01/31/2017 12:31    My personal review of EKG: Rhythm NSR   Assessment & Plan:    Active Problems:   Acute bronchitis   1. Acute bronchitis/pneumonitis- patient, with worsening shortness of breath, he likely has underlying COPD which is not formally diagnosed. Will start Solu-Medrol 40 mg IV every 6 hours, DuoNeb nebulizers every 6 hours, Zithromax 500 mg IV daily. Chest x-ray shows possible pneumonitis, small right pleural effusion.      DVT Prophylaxis-   Lovenox   AM Labs Ordered, also please review Full Orders  Family Communication: Admission, patients condition and plan of care including  tests being ordered have been discussed with the patient  who indicate understanding and agree with the plan and Code Status.  Code Status:  Full code  Admission status: Observation  Time spent in minutes : 60 min   LAMA,GAGAN S M.D on 01/31/2017 at 3:46 PM  Between 7am to 7pm - Pager - [phone]. After 7pm go to [email or web address] - password [facility]  [facility] - Office  [phone]

## 2017-01-31 NOTE — ED Notes (Signed)
Pt walked around nurses station on RA SpO2 readings between 96-98%. Increased work of breathing and pursed lipped breathing noted. MD Zammit notified.

## 2017-01-31 NOTE — ED Notes (Signed)
Report given to 300 RN at this time.

## 2017-01-31 NOTE — Progress Notes (Signed)
   Subjective:    Patient ID: Joseph Atkins, male    DOB: [birth year], 56 y.o.   MRN: [id number]  Shortness of Breath  This is a new problem. The current episode started in the past 7 days. The problem occurs constantly. The problem has been rapidly worsening. Associated symptoms include headaches, orthopnea and rhinorrhea. Pertinent negatives include no chest pain, ear pain, leg swelling, sore throat, sputum production, swollen glands or vomiting. The symptoms are aggravated by pollens and smoke. Associated symptoms comments: Back pain . He has tried rest and OTC cough suppressants for the symptoms. The treatment provided mild relief.      Review of Systems  HENT: Positive for rhinorrhea. Negative for ear pain and sore throat.   Respiratory: Positive for shortness of breath. Negative for sputum production.   Cardiovascular: Positive for orthopnea. Negative for chest pain and leg swelling.  Gastrointestinal: Negative for vomiting.  Neurological: Positive for headaches.  All other systems reviewed and are negative.      Objective:   Physical Exam  Constitutional: He is oriented to person, place, and time. He appears well-developed and well-nourished. No distress.  HENT:  Head: Normocephalic.  Right Ear: External ear normal.  Left Ear: External ear normal.  Mouth/Throat: Oropharynx is clear and moist.  Eyes: Pupils are equal, round, and reactive to light. Right eye exhibits no discharge. Left eye exhibits no discharge.  Neck: Normal range of motion. Neck supple. No thyromegaly present.  Cardiovascular: Normal rate, regular rhythm, normal heart sounds and intact distal pulses.   No murmur heard. Pulmonary/Chest: Accessory muscle usage present. Tachypnea noted. No respiratory distress. He has decreased breath sounds. He has no wheezes.  Abdominal: Soft. Bowel sounds are normal. He exhibits no distension. There is no tenderness.  Musculoskeletal: Normal range of motion. He exhibits no  edema or tenderness.  Neurological: He is alert and oriented to person, place, and time. He has normal reflexes. No cranial nerve deficit.  Skin: Skin is warm and dry. No rash noted. No erythema.  Psychiatric: He has a normal mood and affect. His behavior is normal. Judgment and thought content normal.  Vitals reviewed.     BP 129/88 (BP Location: Left Arm, Patient Position: Sitting, Cuff Size: Normal)   Pulse 80   Temp (!) 97 F (36.1 C) (Oral)   Resp (!) 28   Ht 5\' 8"  (1.727 m)   Wt 157 lb 6.4 oz (71.4 kg)   SpO2 100%   BMI 23.93 kg/m      Assessment & Plan:  1. SOB (shortness of breath) - DG Chest 2 View  2. Smoker  3. Tachypnea   Worrisome for PE will send to ED to rule out Pt's Vital Signs stable at this time, but pt tachypnea and purse lip breathing  Pt does not want EMS states his father or Girlfriend will drive him, long discussion on the importance of going to  ED now! RTO prn  Evelina Dun, FNP

## 2017-01-31 NOTE — ED Notes (Signed)
Pt placed on 2L [place] for comfort, O2 sat on RA is 96%.

## 2017-01-31 NOTE — ED Triage Notes (Signed)
Onset coughing Sunday, worse yesterday, Pt is SOB in triage, Worse with exertion,  Pt went to PCP, was sent here to rule out Blood clot

## 2017-01-31 NOTE — ED Provider Notes (Signed)
[facility] DEPT Provider Note   CSN: [id number] Arrival date & time: 01/31/17  1053     History   Chief Complaint Chief Complaint  Patient presents with  . Shortness of Breath    [name] is a 56 y.o. male.  Patient complains of shortness of breath and cough for a few days. He has a history of smoking 2 packs a day for over 30 years   The history is provided by the patient. No language interpreter was used.  Shortness of Breath  This is a new problem. The problem occurs frequently.The current episode started more than 1 week ago. The problem has not changed since onset.Associated symptoms include wheezing. Pertinent negatives include no fever, no headaches, no cough, no chest pain, no abdominal pain and no rash. It is unknown what precipitated the problem. Risk factors include smoking. He has tried nothing for the symptoms. The treatment provided no relief.    Past Medical History:  Diagnosis Date  . Diverticulosis   . GERD (gastroesophageal reflux disease)   . Hx of small bowel obstruction   . Hyperlipidemia   . IBS (irritable bowel syndrome)   . Substance abuse (HCC)    Alcoholic, Drug addition  . Thyroid disease     Patient Active Problem List   Diagnosis Date Noted  . Essential hypertension 08/28/2014  . Vitamin D deficiency 07/22/2014  . Hyperlipidemia 07/15/2014  . GERD (gastroesophageal reflux disease) 07/15/2014  . Hypothyroidism 07/15/2014  . Smoker 07/15/2014  . Constipation 03/07/2013    Past Surgical History:  Procedure Laterality Date  . arm surgery Right   . FINGER SURGERY Right    Middle       Home Medications    Prior to Admission medications   Medication Sig Start Date End Date Taking? Authorizing Provider  docusate sodium (COLACE) 100 MG capsule Take 100 mg by mouth daily.   Yes [provider]  ibuprofen (ADVIL,MOTRIN) 200 MG tablet Take 800 mg by mouth every 6 (six) hours as needed for moderate pain.   Yes  [provider]  omeprazole (PRILOSEC OTC) 20 MG tablet Take 20 mg by mouth daily.   Yes [provider]  omeprazole (PRILOSEC) 40 MG capsule Take 1 capsule (40 mg total) by mouth daily. 09/15/14  Yes Hawks, Christy A, FNP  polyethylene glycol powder (MIRALAX) powder Take 17 g by mouth daily. 05/09/14  Yes Lysbeth Penner, FNP    Family History Family History  Problem Relation Age of Onset  . Epilepsy Mother   . Heart disease Mother   . Heart disease Brother   . Lung cancer Paternal Uncle     Social History Social History  Substance Use Topics  . Smoking status: Current Every Day Smoker    Packs/day: 2.00    Types: Cigarettes  . Smokeless tobacco: Current User    Types: Chew  . Alcohol use No     Comment: Alcoholic clean for 6 years     Allergies   Penicillins   Review of Systems Review of Systems  Constitutional: Negative for appetite change, fatigue and fever.  HENT: Negative for congestion, ear discharge and sinus pressure.   Eyes: Negative for discharge.  Respiratory: Positive for shortness of breath and wheezing. Negative for cough.   Cardiovascular: Negative for chest pain.  Gastrointestinal: Negative for abdominal pain and diarrhea.  Genitourinary: Negative for frequency and hematuria.  Musculoskeletal: Negative for back pain.  Skin: Negative for rash.  Neurological: Negative  for seizures and headaches.  Psychiatric/Behavioral: Negative for hallucinations.     Physical Exam Updated Vital Signs BP (!) 143/70   Pulse 72   Temp 98 F (36.7 C) (Temporal)   Resp (!) 25   Ht 5\' 9"  (1.753 m)   Wt 71.2 kg (157 lb)   SpO2 94%   BMI 23.18 kg/m   Physical Exam  Constitutional: He is oriented to person, place, and time. He appears well-developed.  HENT:  Head: Normocephalic.  Eyes: Conjunctivae and EOM are normal. No scleral icterus.  Neck: Neck supple. No thyromegaly present.  Cardiovascular: Normal rate and regular rhythm.  Exam  reveals no gallop and no friction rub.   No murmur heard. Pulmonary/Chest: No stridor. He has wheezes. He has no rales. He exhibits no tenderness.  Abdominal: He exhibits no distension. There is no tenderness. There is no rebound.  Musculoskeletal: Normal range of motion. He exhibits no edema.  Lymphadenopathy:    He has no cervical adenopathy.  Neurological: He is oriented to person, place, and time. He exhibits normal muscle tone. Coordination normal.  Skin: No rash noted. No erythema.  Psychiatric: He has a normal mood and affect. His behavior is normal.     ED Treatments / Results  Labs (all labs ordered are listed, but only abnormal results are displayed) Labs Reviewed  COMPREHENSIVE METABOLIC PANEL - Abnormal; Notable for the following:       Result Value   Glucose, Bld 153 (*)    All other components within normal limits  CBC WITH DIFFERENTIAL/PLATELET  TROPONIN I    EKG  EKG Interpretation None       Radiology Dg Chest 2 View  Result Date: 01/31/2017 CLINICAL DATA:  Cough.  Shortness of breath . EXAM: CHEST  2 VIEW COMPARISON:  01/31/2017 FINDINGS: Mediastinum hilar structures normal. Very mild increase interstitial markings. Small right pleural effusion. No pneumothorax. Heart size normal. No acute bony abnormality . IMPRESSION: Very mild increase in interstitial prominence. Mild pneumonitis cannot be excluded. No focal alveolar infiltrate. Small right pleural effusion. Electronically Signed   By: Marcello Moores  Register   On: 01/31/2017 12:31    Procedures Procedures (including critical care time)  Medications Ordered in ED Medications  magnesium sulfate IVPB 2 g 50 mL (2 g Intravenous New Bag/Given 01/31/17 1504)  ipratropium-albuterol (DUONEB) 0.5-2.5 (3) MG/3ML nebulizer solution 3 mL (3 mLs Nebulization Given 01/31/17 1152)  albuterol (PROVENTIL) (2.5 MG/3ML) 0.083% nebulizer solution 2.5 mg (2.5 mg Nebulization Given 01/31/17 1151)  methylPREDNISolone sodium  succinate (SOLU-MEDROL) 125 mg/2 mL injection 125 mg (125 mg Intravenous Given 01/31/17 1226)  ipratropium-albuterol (DUONEB) 0.5-2.5 (3) MG/3ML nebulizer solution 3 mL (3 mLs Nebulization Given 01/31/17 1401)  albuterol (PROVENTIL) (2.5 MG/3ML) 0.083% nebulizer solution 2.5 mg (2.5 mg Nebulization Given 01/31/17 1401)  ipratropium-albuterol (DUONEB) 0.5-2.5 (3) MG/3ML nebulizer solution 3 mL (3 mLs Nebulization Given 01/31/17 1501)  albuterol (PROVENTIL) (2.5 MG/3ML) 0.083% nebulizer solution 2.5 mg (2.5 mg Nebulization Given 01/31/17 1501)     Initial Impression / Assessment and Plan / ED Course  I have reviewed the triage vital signs and the nursing notes.  Pertinent labs & imaging results that were available during my care of the patient were reviewed by me and considered in my medical decision making (see chart for details).     Patient with exacerbation of COPD. Patient continued to wheeze and was short of breath after 2 nebulized treatments. He will be admitted to medicine for further care for his exacerbation  of COPD  Final Clinical Impressions(s) / ED Diagnoses   Final diagnoses:  COPD exacerbation ([place])    New Prescriptions New Prescriptions   No medications on file     Milton Ferguson, MD 01/31/17 1525

## 2017-01-31 NOTE — ED Notes (Signed)
Respiratory paged

## 2017-02-01 DIAGNOSIS — F17200 Nicotine dependence, unspecified, uncomplicated: Secondary | ICD-10-CM

## 2017-02-01 DIAGNOSIS — J209 Acute bronchitis, unspecified: Secondary | ICD-10-CM | POA: Diagnosis not present

## 2017-02-01 LAB — COMPREHENSIVE METABOLIC PANEL WITH GFR
ALT: 21 U/L (ref 17–63)
AST: 19 U/L (ref 15–41)
Albumin: 3.9 g/dL (ref 3.5–5.0)
Alkaline Phosphatase: 55 U/L (ref 38–126)
Anion gap: 9 (ref 5–15)
BUN: 12 mg/dL (ref 6–20)
CO2: 23 mmol/L (ref 22–32)
Calcium: 9.2 mg/dL (ref 8.9–10.3)
Chloride: 104 mmol/L (ref 101–111)
Creatinine, Ser: 0.76 mg/dL (ref 0.61–1.24)
GFR calc Af Amer: >60 mL/min
GFR calc non Af Amer: >60 mL/min
Glucose, Bld: 201 mg/dL — ABNORMAL HIGH (ref 65–99)
Potassium: 4 mmol/L (ref 3.5–5.1)
Sodium: 136 mmol/L (ref 135–145)
Total Bilirubin: 0.3 mg/dL (ref 0.3–1.2)
Total Protein: 6.9 g/dL (ref 6.5–8.1)

## 2017-02-01 LAB — CBC
HCT: 40.8 % (ref 39.0–52.0)
Hemoglobin: 13.5 g/dL (ref 13.0–17.0)
MCH: 30.3 pg (ref 26.0–34.0)
MCHC: 33.1 g/dL (ref 30.0–36.0)
MCV: 91.5 fL (ref 78.0–100.0)
Platelets: 190 10*3/uL (ref 150–400)
RBC: 4.46 MIL/uL (ref 4.22–5.81)
RDW: 13.6 % (ref 11.5–15.5)
WBC: 10.5 10*3/uL (ref 4.0–10.5)

## 2017-02-01 LAB — HIV ANTIBODY (ROUTINE TESTING W REFLEX): HIV Screen 4th Generation wRfx: NONREACTIVE

## 2017-02-01 MED ORDER — NICOTINE 21 MG/24HR TD PT24: 21.0000 mg | MEDICATED_PATCH | Freq: Every day | TRANSDERMAL | 0 refills | Status: DC

## 2017-02-01 MED ORDER — LEVOFLOXACIN 750 MG PO TABS: 750.0000 mg | ORAL_TABLET | Freq: Every day | ORAL | 0 refills | Status: AC

## 2017-02-01 MED ORDER — GUAIFENESIN-DM 100-10 MG/5ML PO SYRP: 5.0000 mL | ORAL_SOLUTION | ORAL | 0 refills | Status: DC | PRN

## 2017-02-01 MED ORDER — PREDNISONE 20 MG PO TABS: 40.0000 mg | ORAL_TABLET | Freq: Every day | ORAL | 0 refills | Status: DC

## 2017-02-01 NOTE — Discharge Summary (Signed)
Physician Discharge Summary  Joseph Atkins WUJ:[id number] DOB: [birth year] DOA: 01/31/2017  PCP: Dettinger, Fransisca Kaufmann, MD  Admit date: 01/31/2017 Discharge date: 02/01/2017  Admitted From: Home Disposition:  Home  Recommendations for Outpatient Follow-up:  #1 Follow up with PCP in 1 week. Please obtain follow-up chest x-ray in 4 weeks to ensure resolution. #2 Patient will complete 5 day course of antibiotics on 02/05/2017.   Home Health: None Equipment/Devices: None  Discharge Condition: Fair CODE STATUS: Full code Diet recommendation: Regular   Discharge Diagnoses:  Principal problem Acute bronchitis  Active Problems:   GERD (gastroesophageal reflux disease)   Smoker   Acute bronchitis  Brief narrative/history of present illness 56 year old male who is a heavy smoker presented to the ED with shortness of breath, worsened on exertion associated with cough with clear sputum for past 4 days. It started with a runny nose and sinus headaches which is associated with some chills but no fever. Denied any nausea, vomiting, chest pain, palpitations, abdominal pain, bowel or urinary symptoms. He smokes almost 2 packs per day. Vitals in the ED were stable except for the satting to 80s on room air. Had  normal labs and chest x-ray showing increased interstitial prominence and possible pneumonitis. Placed on observation for acute bronchitis.  Hospital course Acute bronchitis Patient feels much better after receiving IV Solu Medrol, empiric antibiotics and breathing treatments. Still has some cough with clear phlegm but improved. I will discharge him on oral Levaquin to complete 5 day course of antibiotics along with oral prednisone to complete a 5 day course as well. Prescribed antitussives. Follow-up with PCP in one week and needs chest x-ray in about 4 weeks to ensure resolution of the right-sided pneumonitis/? Infiltrate.  Tobacco use Smokes almost 2 packs per day and counseled  strongly on cessation. Nicotine patch prescribed.  Family communication: Girlfriend at bedside Procedure: None Consults: None  Disposition: Home   Discharge Instructions   Allergies as of 02/01/2017      Reactions   Penicillins Other (See Comments)   Childhood allergy.  Has patient had a PCN reaction causing immediate rash, facial/tongue/throat swelling, SOB or lightheadedness with hypotension: unknown Has patient had a PCN reaction causing severe rash involving mucus membranes or skin necrosis: unknown Has patient had a PCN reaction that required hospitalization: unknown Has patient had a PCN reaction occurring within the last 10 years: no If all of the above answers are "NO", then may proceed with Cephalosporin use.      Medication List    STOP taking these medications   omeprazole 20 MG tablet Commonly known as:  PRILOSEC OTC     TAKE these medications   docusate sodium 100 MG capsule Commonly known as:  COLACE Take 100 mg by mouth daily.  Guaifensin-dextromethorphan 100-10 mg/5 ML syrup Commonly known as Robitussin DM  Take 5 mls by mouth every 4 hours as needed for cough   ibuprofen 200 MG tablet Commonly known as:  ADVIL,MOTRIN Take 800 mg by mouth every 6 (six) hours as needed for moderate pain.   levofloxacin 750 MG tablet Commonly known as:  LEVAQUIN Take 1 tablet (750 mg total) by mouth daily.   nicotine 21 mg/24hr patch Commonly known as:  NICODERM CQ - dosed in mg/24 hours Place 1 patch (21 mg total) onto the skin daily.   omeprazole 40 MG capsule Commonly known as:  PRILOSEC Take 1 capsule (40 mg total) by mouth daily.   polyethylene glycol powder powder Commonly known as:  MIRALAX Take 17 g by mouth daily.   predniSONE 20 MG tablet Commonly known as:  DELTASONE Take 2 tablets (40 mg total) by mouth daily.      Follow-up Information    Dettinger, Fransisca Kaufmann, MD Follow up in 1 week(s).   Specialties:  Family Medicine, Cardiology Contact  information: [address] [place] [address] [phone]          Allergies  Allergen Reactions  . Penicillins Other (See Comments)    Childhood allergy.  Has patient had a PCN reaction causing immediate rash, facial/tongue/throat swelling, SOB or lightheadedness with hypotension: unknown Has patient had a PCN reaction causing severe rash involving mucus membranes or skin necrosis: unknown Has patient had a PCN reaction that required hospitalization: unknown Has patient had a PCN reaction occurring within the last 10 years: no If all of the above answers are "NO", then may proceed with Cephalosporin use.       Procedures/Studies: Dg Chest 2 View  Result Date: 01/31/2017 CLINICAL DATA:  Shortness of breath.  Smoking history. EXAM: CHEST  2 VIEW COMPARISON:  02/11/2013 FINDINGS: Heart size is normal. Mediastinal shadows are normal. The lungs appear hyperinflated but there is no sign of infiltrate, mass, effusion or collapse. No acute bone finding. IMPRESSION: Pulmonary hyperinflation.  No infiltrate, collapse or effusion. Electronically Signed   By: Nelson Chimes M.D.   On: 01/31/2017 15:56   Dg Chest 2 View  Result Date: 01/31/2017 CLINICAL DATA:  Cough.  Shortness of breath . EXAM: CHEST  2 VIEW COMPARISON:  01/31/2017 FINDINGS: Mediastinum hilar structures normal. Very mild increase interstitial markings. Small right pleural effusion. No pneumothorax. Heart size normal. No acute bony abnormality . IMPRESSION: Very mild increase in interstitial prominence. Mild pneumonitis cannot be excluded. No focal alveolar infiltrate. Small right pleural effusion. Electronically Signed   By: Marcello Moores  Register   On: 01/31/2017 12:31       Subjective: Reports his breathing to be much better. Has some cough with phlegm.  Discharge Exam: Vitals:   01/31/17 2046 02/01/17 0646  BP: (!) 119/51 126/76  Pulse: 91 75  Resp: (!) 24 (!) 22  Temp: 97.9 F (36.6 C) 98 F (36.7 C)  SpO2: 96%  95%   Vitals:   01/31/17 1621 01/31/17 2019 01/31/17 2046 02/01/17 0646  BP: 131/82  (!) 119/51 126/76  Pulse: 89  91 75  Resp: (!) 24  (!) 24 (!) 22  Temp: 97.8 F (36.6 C)  97.9 F (36.6 C) 98 F (36.7 C)  TempSrc: Oral  Oral Oral  SpO2: (!) 81% 98% 96% 95%  Weight: 70.6 kg (155 lb 9.6 oz)     Height: 5\' 8"  (1.727 m)       General: Middle aged male not in distress HEENT: No pallor, moist, supple neck Chest: Scattered rhonchi bilaterally CVS: Normal S1 and S2, no murmurs rub or gallop GI: Soft, nondistended, nontender Musculoskeletal: Warm, no edema      The results of significant diagnostics from this hospitalization (including imaging, microbiology, ancillary and laboratory) are listed below for reference.     Microbiology: No results found for this or any previous visit (from the past 240 hour(s)).   Labs: BNP (last 3 results) No results for input(s): BNP in the last 8760 hours. Basic Metabolic Panel:  Recent Labs Lab 01/31/17 1136 02/01/17 0407  NA 136 136  K 3.5 4.0  CL 101 104  CO2 26 23  GLUCOSE 153* 201*  BUN 7  12  CREATININE 0.90 0.76  CALCIUM 8.9 9.2   Liver Function Tests:  Recent Labs Lab 01/31/17 1136 02/01/17 0407  AST 22 19  ALT 21 21  ALKPHOS 55 55  BILITOT 0.5 0.3  PROT 6.7 6.9  ALBUMIN 3.9 3.9   No results for input(s): LIPASE, AMYLASE in the last 168 hours. No results for input(s): AMMONIA in the last 168 hours. CBC:  Recent Labs Lab 01/31/17 1136 02/01/17 0407  WBC 7.4 10.5  NEUTROABS 4.5  --   HGB 14.3 13.5  HCT 42.5 40.8  MCV 92.0 91.5  PLT 192 190   Cardiac Enzymes:  Recent Labs Lab 01/31/17 1136  TROPONINI <0.03   BNP: Invalid input(s): POCBNP CBG: No results for input(s): GLUCAP in the last 168 hours. D-Dimer No results for input(s): DDIMER in the last 72 hours. Hgb A1c No results for input(s): HGBA1C in the last 72 hours. Lipid Profile No results for input(s): CHOL, HDL, LDLCALC, TRIG, CHOLHDL,  LDLDIRECT in the last 72 hours. Thyroid function studies No results for input(s): TSH, T4TOTAL, T3FREE, THYROIDAB in the last 72 hours.  Invalid input(s): FREET3 Anemia work up No results for input(s): VITAMINB12, FOLATE, FERRITIN, TIBC, IRON, RETICCTPCT in the last 72 hours. Urinalysis    Component Value Date/Time   APPEARANCEUR Clear 06/15/2016 0925   GLUCOSEU Negative 06/15/2016 0925   BILIRUBINUR Negative 06/15/2016 0925   PROTEINUR Negative 06/15/2016 0925   NITRITE Negative 06/15/2016 0925   LEUKOCYTESUR Negative 06/15/2016 0925   Sepsis Labs Invalid input(s): PROCALCITONIN,  WBC,  LACTICIDVEN Microbiology No results found for this or any previous visit (from the past 240 hour(s)).   Time coordinating discharge: < 30 minutes  SIGNED:   Louellen Molder, MD  [facility] 02/01/2017, 12:29 PM Pager   If 7PM-7AM, please contact night-coverage [email or web address] Password [email or web address]

## 2017-02-01 NOTE — Progress Notes (Signed)
IV removed.  Site clean, dry and intact.  AVS discussed.  Pt verbalized understanding.  New Rx for four new prescriptions given to patient.  Pt taken down to lobby.  Pt walked to car.  Pt was stable upon discharge.

## 2017-02-07 ENCOUNTER — Encounter: Payer: Self-pay | Admitting: Family Medicine

## 2017-02-07 ENCOUNTER — Ambulatory Visit (INDEPENDENT_AMBULATORY_CARE_PROVIDER_SITE_OTHER): Payer: BLUE CROSS/BLUE SHIELD | Admitting: Family Medicine

## 2017-02-07 VITALS — BP 140/85 | HR 62 | Temp 97.0°F | Ht 68.0 in | Wt 160.0 lb

## 2017-02-07 DIAGNOSIS — I10 Essential (primary) hypertension: Secondary | ICD-10-CM

## 2017-02-07 DIAGNOSIS — K2100 Gastro-esophageal reflux disease with esophagitis, without bleeding: Secondary | ICD-10-CM

## 2017-02-07 DIAGNOSIS — J441 Chronic obstructive pulmonary disease with (acute) exacerbation: Secondary | ICD-10-CM

## 2017-02-07 MED ORDER — ALBUTEROL SULFATE HFA 108 (90 BASE) MCG/ACT IN AERS: 2.0000 | INHALATION_SPRAY | Freq: Four times a day (QID) | RESPIRATORY_TRACT | 0 refills | Status: DC | PRN

## 2017-02-07 MED ORDER — OMEPRAZOLE 40 MG PO CPDR: 40.0000 mg | DELAYED_RELEASE_CAPSULE | Freq: Every day | ORAL | 4 refills | Status: DC

## 2017-02-07 NOTE — Progress Notes (Signed)
BP 140/85   Pulse 62   Temp (!) 97 F (36.1 C) (Oral)   Ht 5\' 8"  (1.727 m)   Wt 160 lb (72.6 kg)   SpO2 96%   BMI 24.33 kg/m    Subjective:    Patient ID: Joseph Atkins, male    DOB: 01/14/1961, 56 y.o.   MRN: [id number]  HPI: Joseph Atkins is a 56 y.o. male presenting on 02/07/2017 for Hospitalization Follow-up (has cut back to 2 cigarettes per day, using patch but he is having nightmares) and Heartburn (was taken off Omeprazole while in hospital)   HPI Hypertension patient is currently on nothing currently and we are just monitoring his blood pressures for now, and their blood pressure today is 140/85. Patient denies any lightheadedness or dizziness. Patient denies headaches, blurred vision, chest pains, shortness of breath, or weakness. Denies any side effects from medication and is content with current medication.  GERD Patient is currently on nothing because they stopped the omeprazole at the hospital but did not give a good explanation in the notes as to why.  He does have all follow-up but with cardiology but I do not see a reason why he needs to his omeprazole at this current time based on the information I have received.  We will go ahead and resume his omeprazole because he is having a lot of symptoms.  She denies any blood in her stool or lightheadedness or dizziness.   COPD exacerbation/hospital follow-up Patient is coming in for COPD hospital follow-up for an exacerbation.  He was in the hospital overnight from 01/31/2017 until 02/01/2017.  The patient was found to be in a bronchitis/COPD-like state.  Patient had been a long-term smoker of 2 packs/day and his oxygen was down in the 80s on room air.  He was given instructions to finish an antibiotic course and some prednisone and to follow-up with [name] and have an x-ray before weeks after hospitalization to see if he has resolution.  He says since leaving the hospital his breathing has been doing great and he is not  having any further issues.  He does have a little bit more of a cough but he feels like that is starting to clear up.  Relevant past medical, surgical, family and social history reviewed and updated as indicated. Interim medical history since our last visit reviewed. Allergies and medications reviewed and updated.  Review of Systems  Constitutional: Negative for chills and fever.  HENT: Negative for congestion.   Eyes: Negative for discharge.  Respiratory: Positive for cough. Negative for chest tightness, shortness of breath and wheezing.   Cardiovascular: Negative for chest pain and leg swelling.  Gastrointestinal: Positive for abdominal pain. Negative for blood in stool, constipation, diarrhea, nausea and vomiting.  Musculoskeletal: Negative for back pain and gait problem.  Skin: Negative for rash.  Neurological: Negative for dizziness, light-headedness and headaches.  All other systems reviewed and are negative.   Per HPI unless specifically indicated above        Objective:    BP 140/85   Pulse 62   Temp (!) 97 F (36.1 C) (Oral)   Ht 5\' 8"  (8.242 m)   Wt 160 lb (72.6 kg)   SpO2 96%   BMI 24.33 kg/m   Wt Readings from Last 3 Encounters:  02/07/17 160 lb (72.6 kg)  01/31/17 155 lb 9.6 oz (70.6 kg)  01/31/17 157 lb 6.4 oz (71.4 kg)    Physical Exam  Constitutional: He is  oriented to person, place, and time. He appears well-developed and well-nourished. No distress.  HENT:  Nose: Nose normal.  Mouth/Throat: Oropharynx is clear and moist.  Eyes: Conjunctivae are normal. No scleral icterus.  Neck: Neck supple. No thyromegaly present.  Cardiovascular: Normal rate, regular rhythm, normal heart sounds and intact distal pulses.   No murmur heard. Pulmonary/Chest: Effort normal and breath sounds normal. No respiratory distress. He has no wheezes. He has no rales.  Abdominal: Soft. Bowel sounds are normal. He exhibits no distension. There is no tenderness (No tenderness on  exam). There is no rebound and no guarding.  Musculoskeletal: Normal range of motion. He exhibits no edema.  Neurological: He is alert and oriented to person, place, and time. Coordination normal.  Skin: Skin is warm and dry. No rash noted. He is not diaphoretic.  Psychiatric: He has a normal mood and affect. His behavior is normal.  Nursing note and vitals reviewed.   Results for orders placed or performed during the hospital encounter of 01/31/17  CBC with Differential/Platelet  Result Value Ref Range   WBC 7.4 4.0 - 10.5 K/uL   RBC 4.62 4.22 - 5.81 MIL/uL   Hemoglobin 14.3 13.0 - 17.0 g/dL   HCT 42.5 39.0 - 52.0 %   MCV 92.0 78.0 - 100.0 fL   MCH 31.0 26.0 - 34.0 pg   MCHC 33.6 30.0 - 36.0 g/dL   RDW 13.6 11.5 - 15.5 %   Platelets 192 150 - 400 K/uL   Neutrophils Relative % 61 %   Neutro Abs 4.5 1.7 - 7.7 K/uL   Lymphocytes Relative 24 %   Lymphs Abs 1.8 0.7 - 4.0 K/uL   Monocytes Relative 10 %   Monocytes Absolute 0.7 0.1 - 1.0 K/uL   Eosinophils Relative 4 %   Eosinophils Absolute 0.3 0.0 - 0.7 K/uL   Basophils Relative 1 %   Basophils Absolute 0.1 0.0 - 0.1 K/uL  Comprehensive metabolic panel  Result Value Ref Range   Sodium 136 135 - 145 mmol/L   Potassium 3.5 3.5 - 5.1 mmol/L   Chloride 101 101 - 111 mmol/L   CO2 26 22 - 32 mmol/L   Glucose, Bld 153 (H) 65 - 99 mg/dL   BUN 7 6 - 20 mg/dL   Creatinine, Ser 0.90 0.61 - 1.24 mg/dL   Calcium 8.9 8.9 - 10.3 mg/dL   Total Protein 6.7 6.5 - 8.1 g/dL   Albumin 3.9 3.5 - 5.0 g/dL   AST 22 15 - 41 U/L   ALT 21 17 - 63 U/L   Alkaline Phosphatase 55 38 - 126 U/L   Total Bilirubin 0.5 0.3 - 1.2 mg/dL   GFR calc non Af Amer >60 >60 mL/min   GFR calc Af Amer >60 >60 mL/min   Anion gap 9 5 - 15  Troponin I  Result Value Ref Range   Troponin I <0.03 <0.03 ng/mL  HIV antibody (Routine Testing)  Result Value Ref Range   HIV Screen 4th Generation wRfx Non Reactive Non Reactive  CBC  Result Value Ref Range   WBC 10.5 4.0 -  10.5 K/uL   RBC 4.46 4.22 - 5.81 MIL/uL   Hemoglobin 13.5 13.0 - 17.0 g/dL   HCT 40.8 39.0 - 52.0 %   MCV 91.5 78.0 - 100.0 fL   MCH 30.3 26.0 - 34.0 pg   MCHC 33.1 30.0 - 36.0 g/dL   RDW 13.6 11.5 - 15.5 %   Platelets 190 150 -  400 K/uL  Comprehensive metabolic panel  Result Value Ref Range   Sodium 136 135 - 145 mmol/L   Potassium 4.0 3.5 - 5.1 mmol/L   Chloride 104 101 - 111 mmol/L   CO2 23 22 - 32 mmol/L   Glucose, Bld 201 (H) 65 - 99 mg/dL   BUN 12 6 - 20 mg/dL   Creatinine, Ser 0.76 0.61 - 1.24 mg/dL   Calcium 9.2 8.9 - 10.3 mg/dL   Total Protein 6.9 6.5 - 8.1 g/dL   Albumin 3.9 3.5 - 5.0 g/dL   AST 19 15 - 41 U/L   ALT 21 17 - 63 U/L   Alkaline Phosphatase 55 38 - 126 U/L   Total Bilirubin 0.3 0.3 - 1.2 mg/dL   GFR calc non Af Amer >60 >60 mL/min   GFR calc Af Amer >60 >60 mL/min   Anion gap 9 5 - 15      Assessment & Plan:   Problem List Items Addressed This Visit      Cardiovascular and Mediastinum   Essential hypertension     Digestive   GERD (gastroesophageal reflux disease)   Relevant Medications   omeprazole (PRILOSEC) 40 MG capsule    Other Visit Diagnoses    COPD exacerbation ([place])    -  Primary   Relevant Medications   albuterol (PROVENTIL HFA;VENTOLIN HFA) 108 (90 Base) MCG/ACT inhaler       Follow up plan: Return in about 4 weeks (around 03/07/2017), or if symptoms worsen or fail to improve, for Hypertension and fasting labs.  Counseling provided for all of the vaccine components No orders of the defined types were placed in this encounter.   Caryl Pina, MD [facility] 02/07/2017, 4:34 PM

## 2017-03-08 ENCOUNTER — Ambulatory Visit (INDEPENDENT_AMBULATORY_CARE_PROVIDER_SITE_OTHER): Payer: BLUE CROSS/BLUE SHIELD | Admitting: Family Medicine

## 2017-03-08 ENCOUNTER — Encounter: Payer: Self-pay | Admitting: Family Medicine

## 2017-03-08 VITALS — BP 130/89 | HR 70 | Temp 97.4°F | Ht 68.0 in | Wt 164.0 lb

## 2017-03-08 DIAGNOSIS — I10 Essential (primary) hypertension: Secondary | ICD-10-CM

## 2017-03-08 DIAGNOSIS — E559 Vitamin D deficiency, unspecified: Secondary | ICD-10-CM | POA: Diagnosis not present

## 2017-03-08 DIAGNOSIS — E782 Mixed hyperlipidemia: Secondary | ICD-10-CM | POA: Diagnosis not present

## 2017-03-08 DIAGNOSIS — E039 Hypothyroidism, unspecified: Secondary | ICD-10-CM

## 2017-03-08 DIAGNOSIS — N529 Male erectile dysfunction, unspecified: Secondary | ICD-10-CM | POA: Diagnosis not present

## 2017-03-08 DIAGNOSIS — Z87891 Personal history of nicotine dependence: Secondary | ICD-10-CM | POA: Diagnosis not present

## 2017-03-08 DIAGNOSIS — F17200 Nicotine dependence, unspecified, uncomplicated: Secondary | ICD-10-CM | POA: Diagnosis not present

## 2017-03-08 MED ORDER — NICOTINE POLACRILEX 2 MG MT LOZG: 2.0000 mg | LOZENGE | OROMUCOSAL | 1 refills | Status: DC | PRN

## 2017-03-08 NOTE — Progress Notes (Signed)
BP 130/89   Pulse 70   Temp (!) 97.4 F (36.3 C) (Oral)   Ht _0  (1.727 m)   Wt 164 lb (74.4 kg)   BMI 24.94 kg/m    Subjective:    Patient ID: Joseph Atkins, male    DOB: 11/16/1960, 56 y.o.   MRN: [id number]  HPI: Joseph Atkins is a 56 y.o. male presenting on 03/08/2017 for Hypertension (4 week follow up); Labwork (patient is fasting); and Nicotine patches caused insomnia and nightmares (has cut back to 10-12 cigarettes per day)   HPI Hypothyroidism recheck Patient is coming in for thyroid recheck today as well. They deny any issues with hair changes or heat or cold problems or diarrhea or constipation. They deny any chest pain or palpitations.  He is not currently on any medications for thyroid but has been borderline a few times and we are monitoring it closely  Hyperlipidemia Patient is coming in for recheck of his hyperlipidemia. The patient is currently taking no medication but is due for recheck. They deny any issues with myalgias or history of liver damage from it. They deny any focal numbness or weakness or chest pain.   Hypertension Patient is currently on no medication but we are monitoring for now, and their blood pressure today is 130/89. Patient denies any lightheadedness or dizziness. Patient denies headaches, blurred vision, chest pains, shortness of breath, or weakness. Denies any side effects from medication and is content with current medication.   Erectile dysfunction Patient can get an erection but it is not as hard or firm as he was previously and he cannot sustain an erection.  He says he can get to ejaculation but it is too quick he denies any pain but just says things do not work as well as they used to.  Vitamin D deficiency recheck  Smoker and heavy smoking history Patient is a smoker and has a heavy smoking history and would like a screening checks x-ray.  He denies any symptoms currently more than what he usually has which is a chronic cough  and occasional wheeze.  Patient says he has reduced the amount that he smokes but would like to try and quit and would like to try nicotine lozenges.  He did not do well with the nicotine patches.  Relevant past medical, surgical, family and social history reviewed and updated as indicated. Interim medical history since our last visit reviewed. Allergies and medications reviewed and updated.  Review of Systems  Constitutional: Negative for chills and fever.  Eyes: Negative for discharge.  Respiratory: Positive for cough. Negative for shortness of breath and wheezing.   Cardiovascular: Negative for chest pain and leg swelling.  Gastrointestinal: Negative for abdominal pain.  Genitourinary: Negative for difficulty urinating, discharge, frequency, penile pain, penile swelling and testicular pain.  Musculoskeletal: Negative for back pain and gait problem.  Skin: Negative for rash.  Neurological: Negative for dizziness, weakness, light-headedness and numbness.  All other systems reviewed and are negative.   Per HPI unless specifically indicated above        Objective:    BP 130/89   Pulse 70   Temp (!) 97.4 F (36.3 C) (Oral)   Ht _1  (1.727 m)   Wt 164 lb (74.4 kg)   BMI 24.94 kg/m   Wt Readings from Last 3 Encounters:  03/08/17 164 lb (74.4 kg)  02/07/17 160 lb (72.6 kg)  01/31/17 155 lb 9.6 oz (70.6 kg)    Physical  Exam  Constitutional: He is oriented to person, place, and time. He appears well-developed and well-nourished. No distress.  Eyes: Conjunctivae are normal. No scleral icterus.  Neck: Neck supple. No thyromegaly present.  Cardiovascular: Normal rate, regular rhythm, normal heart sounds and intact distal pulses.  No murmur heard. Pulmonary/Chest: Effort normal and breath sounds normal. No respiratory distress. He has no wheezes. He has no rales.  Musculoskeletal: Normal range of motion. He exhibits no edema.  Lymphadenopathy:    He has no cervical adenopathy.    Neurological: He is alert and oriented to person, place, and time. Coordination normal.  Skin: Skin is warm and dry. No rash noted. He is not diaphoretic.  Psychiatric: He has a normal mood and affect. His behavior is normal.  Nursing note and vitals reviewed.       Assessment & Plan:   Problem List Items Addressed This Visit      Cardiovascular and Mediastinum   Essential hypertension   Relevant Medications   sildenafil (REVATIO) 20 MG tablet   Other Relevant Orders   CMP14+EGFR (Completed)   CBC with Differential/Platelet (Completed)     Endocrine   Hypothyroidism - Primary   Relevant Orders   CBC with Differential/Platelet (Completed)   TSH (Completed)     Other   Hyperlipidemia   Relevant Medications   sildenafil (REVATIO) 20 MG tablet   Other Relevant Orders   Lipid panel (Completed)   Smoker   Relevant Medications   nicotine polacrilex (NICOTINE MINI) 2 MG lozenge   Vitamin D deficiency   Relevant Orders   VITAMIN D 25 Hydroxy (Vit-D Deficiency, Fractures) (Completed)    Other Visit Diagnoses    History of smoking 30 or more pack years       Relevant Medications   nicotine polacrilex (NICOTINE MINI) 2 MG lozenge   Other Relevant Orders   DG Chest 2 View   Erectile dysfunction, unspecified erectile dysfunction type       Relevant Medications   sildenafil (REVATIO) 20 MG tablet       Follow up plan: Return in about 6 months (around 09/05/2017), or if symptoms worsen or fail to improve, for Cholesterol and thyroid.  Counseling provided for all of the vaccine components Orders Placed This Encounter  Procedures  . DG Chest 2 View  . CMP14+EGFR  . Lipid panel  . CBC with Differential/Platelet  . TSH  . VITAMIN D 25 Hydroxy (Vit-D Deficiency, Fractures)    Caryl Pina, MD [facility] 03/08/2017, 4:59 PM

## 2017-03-09 LAB — CBC WITH DIFFERENTIAL/PLATELET
Basophils Absolute: 0.1 10*3/uL (ref 0.0–0.2)
Basos: 1 %
EOS (ABSOLUTE): 0.2 10*3/uL (ref 0.0–0.4)
Eos: 2 %
Hematocrit: 42.2 % (ref 37.5–51.0)
Hemoglobin: 14.4 g/dL (ref 13.0–17.7)
Immature Grans (Abs): 0 10*3/uL (ref 0.0–0.1)
Immature Granulocytes: 0 %
Lymphocytes Absolute: 3 10*3/uL (ref 0.7–3.1)
Lymphs: 31 %
MCH: 30.5 pg (ref 26.6–33.0)
MCHC: 34.1 g/dL (ref 31.5–35.7)
MCV: 89 fL (ref 79–97)
Monocytes Absolute: 0.8 10*3/uL (ref 0.1–0.9)
Monocytes: 8 %
Neutrophils Absolute: 5.6 10*3/uL (ref 1.4–7.0)
Neutrophils: 58 %
Platelets: 194 10*3/uL (ref 150–379)
RBC: 4.72 x10E6/uL (ref 4.14–5.80)
RDW: 13.1 % (ref 12.3–15.4)
WBC: 9.7 10*3/uL (ref 3.4–10.8)

## 2017-03-09 LAB — CMP14+EGFR
ALT: 27 IU/L (ref 0–44)
AST: 30 IU/L (ref 0–40)
Albumin/Globulin Ratio: 1.7 (ref 1.2–2.2)
Albumin: 4.5 g/dL (ref 3.5–5.5)
Alkaline Phosphatase: 56 IU/L (ref 39–117)
BUN/Creatinine Ratio: 12 (ref 9–20)
BUN: 12 mg/dL (ref 6–24)
Bilirubin Total: 0.4 mg/dL (ref 0.0–1.2)
CO2: 25 mmol/L (ref 20–29)
Calcium: 9.6 mg/dL (ref 8.7–10.2)
Chloride: 103 mmol/L (ref 96–106)
Creatinine, Ser: 1 mg/dL (ref 0.76–1.27)
GFR calc Af Amer: 98 mL/min/{1.73_m2} (ref >59)
GFR calc non Af Amer: 84 mL/min/{1.73_m2} (ref >59)
Globulin, Total: 2.6 g/dL (ref 1.5–4.5)
Glucose: 91 mg/dL (ref 65–99)
Potassium: 4.5 mmol/L (ref 3.5–5.2)
Sodium: 141 mmol/L (ref 134–144)
Total Protein: 7.1 g/dL (ref 6.0–8.5)

## 2017-03-09 LAB — VITAMIN D 25 HYDROXY (VIT D DEFICIENCY, FRACTURES): Vit D, 25-Hydroxy: 23.2 ng/mL — ABNORMAL LOW (ref 30.0–100.0)

## 2017-03-09 LAB — LIPID PANEL
Chol/HDL Ratio: 4.2 ratio (ref 0.0–5.0)
Cholesterol, Total: 181 mg/dL (ref 100–199)
HDL: 43 mg/dL (ref >39)
LDL Calculated: 123 mg/dL — ABNORMAL HIGH (ref 0–99)
Triglycerides: 77 mg/dL (ref 0–149)
VLDL Cholesterol Cal: 15 mg/dL (ref 5–40)

## 2017-03-09 LAB — TSH: TSH: 6.12 u[IU]/mL — ABNORMAL HIGH (ref 0.450–4.500)

## 2017-03-09 MED ORDER — SILDENAFIL CITRATE 20 MG PO TABS: 20.0000 mg | ORAL_TABLET | Freq: Every day | ORAL | 0 refills | Status: DC | PRN

## 2017-03-10 ENCOUNTER — Other Ambulatory Visit (INDEPENDENT_AMBULATORY_CARE_PROVIDER_SITE_OTHER): Payer: BLUE CROSS/BLUE SHIELD

## 2017-03-10 DIAGNOSIS — Z87891 Personal history of nicotine dependence: Secondary | ICD-10-CM

## 2017-03-10 IMAGING — DX DG CHEST 2V
3 series · 3 of 3 positions shown · non-contrast
Comparison: Chest radiograph [DATE]

CLINICAL DATA: Follow-up pneumonia.  Thirty pack-year history.

EXAM:
CHEST  2 VIEW

[chest pa (1 of 2)]
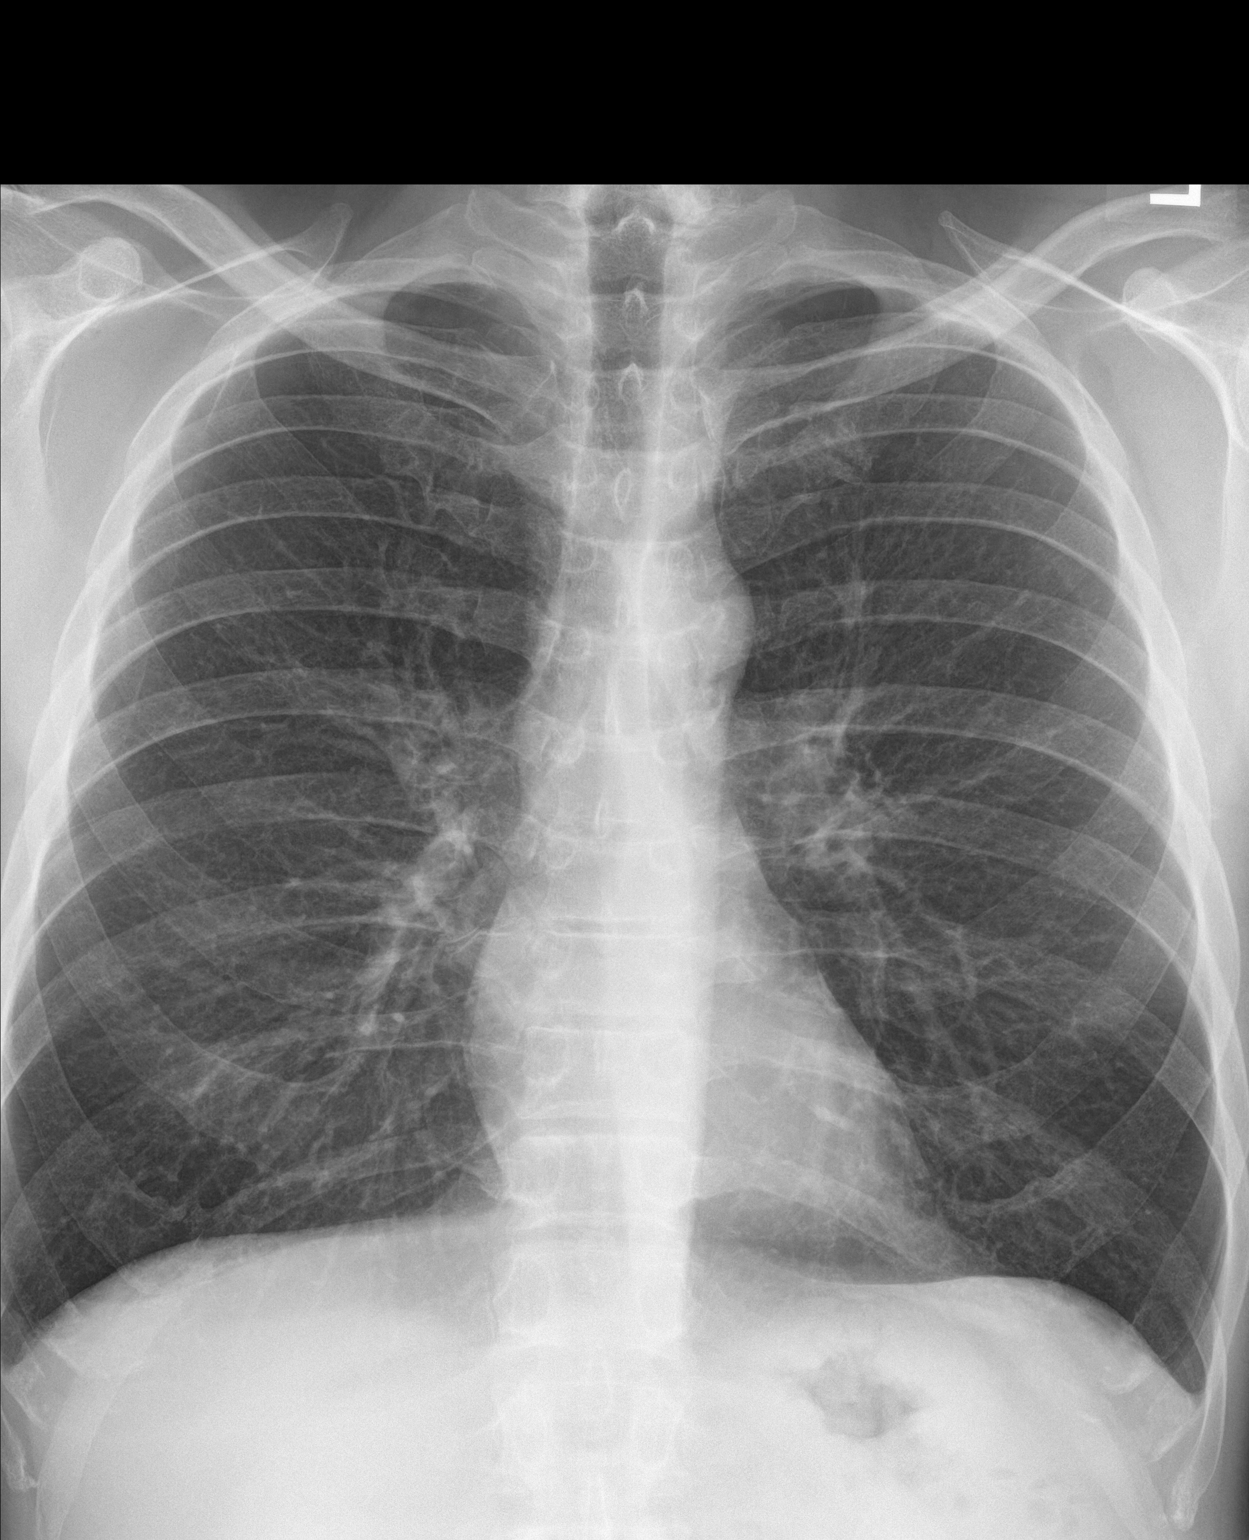

[chest lat]
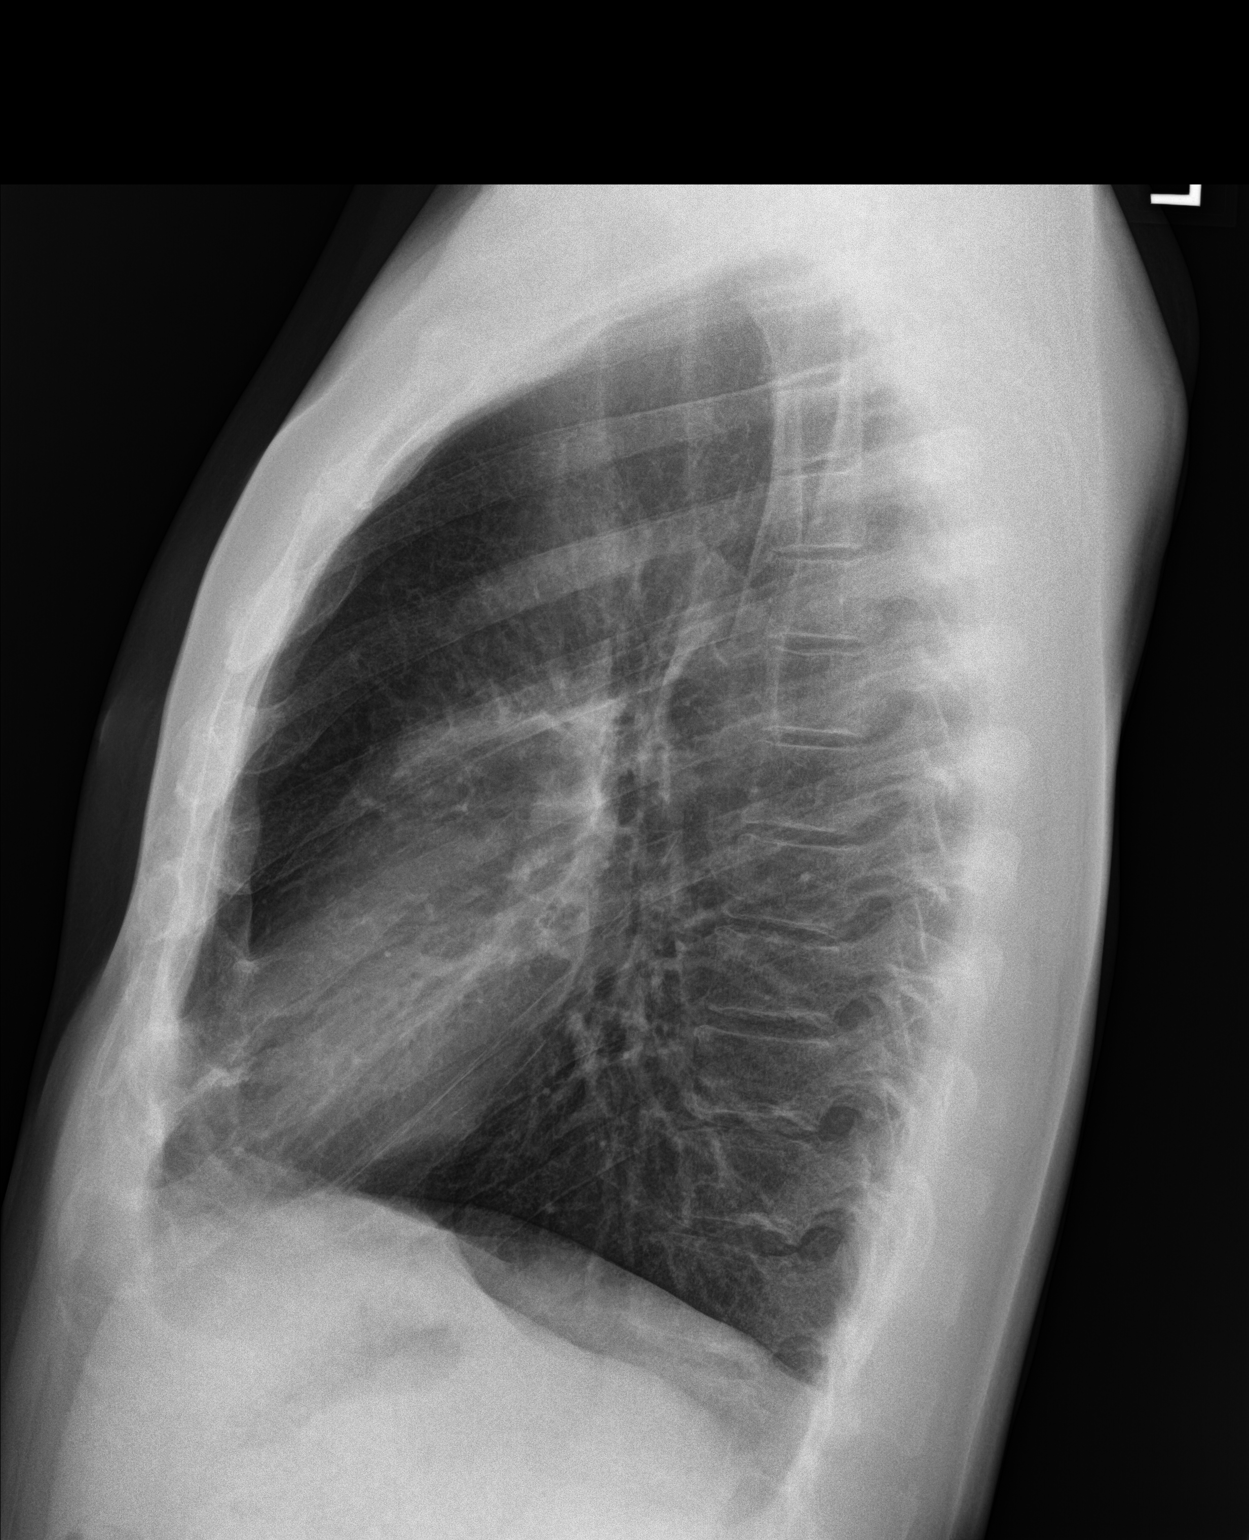

[chest pa (2 of 2)]
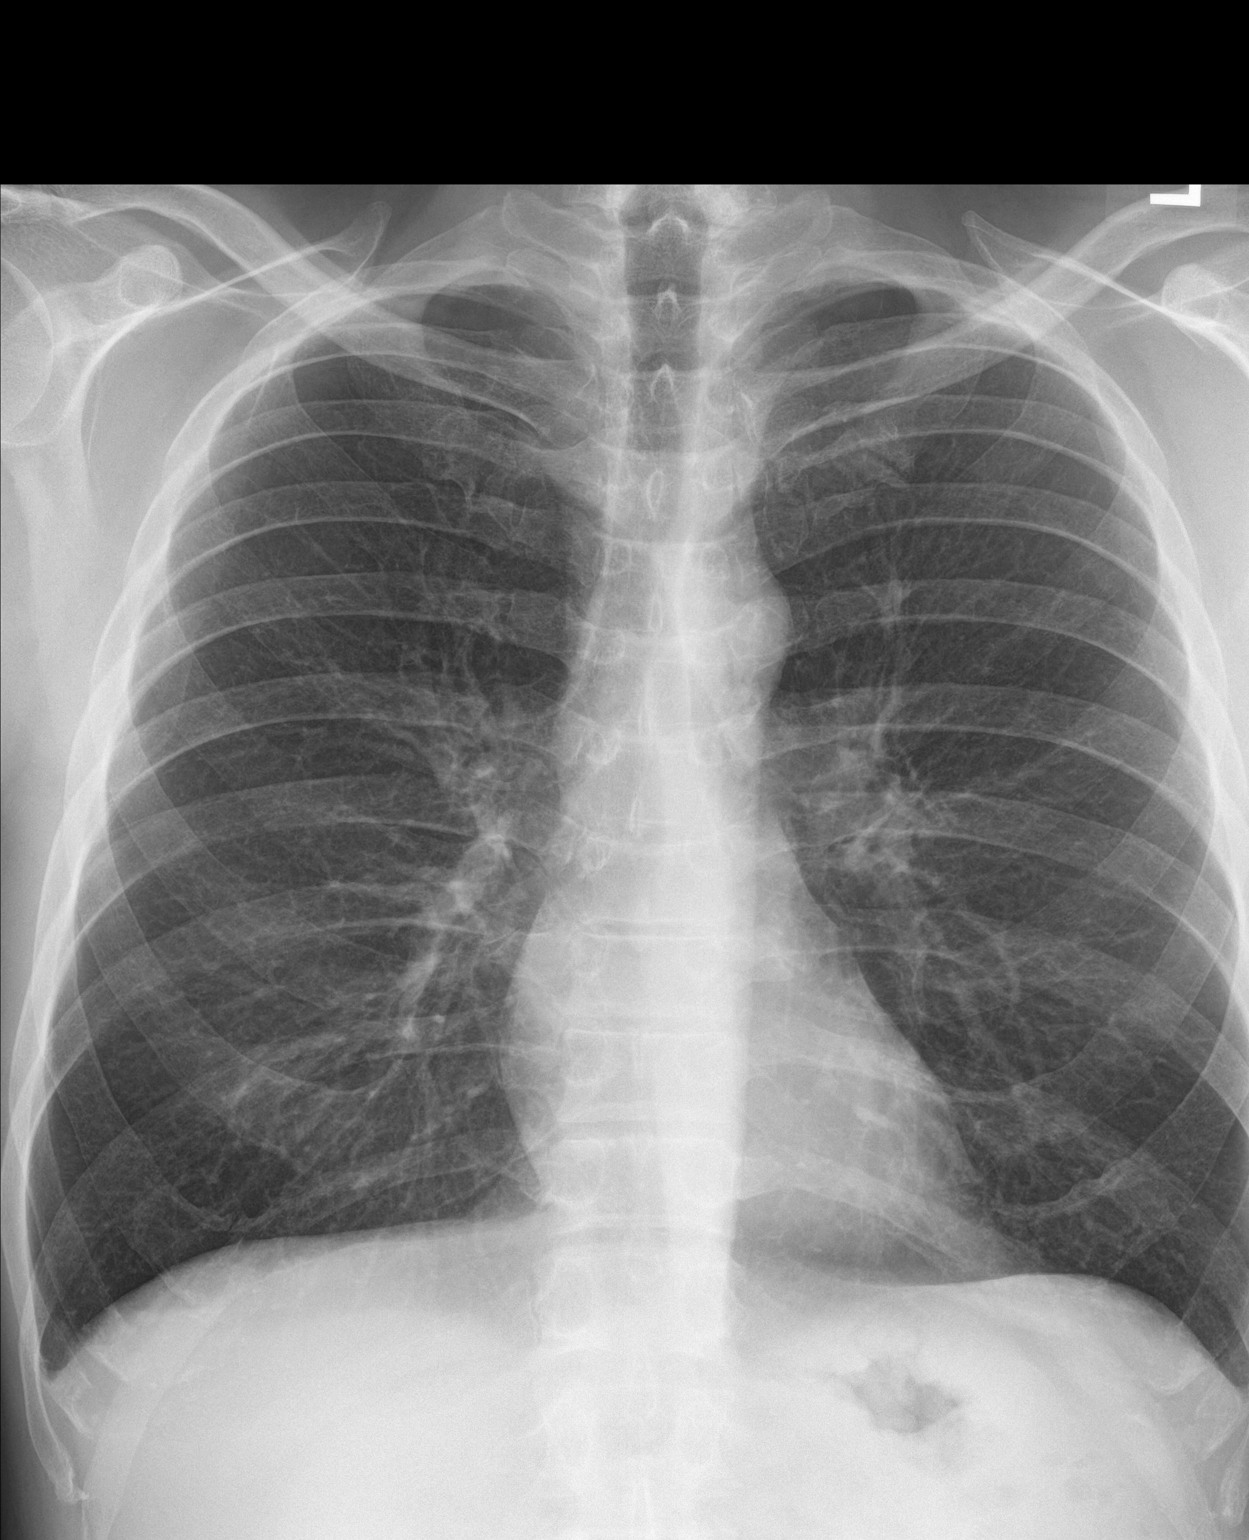

[3 of 3 positions shown; findings below may reference images not displayed]

FINDINGS: Cardiomediastinal silhouette is normal. Mild hyperinflation. Slight
blunting of the costophrenic angles most compatible with pleural
thickening. No focal consolidation. No pneumothorax. Soft tissue
planes and included osseous structures are nonacute. Moderate
bilateral acromial clavicular osteoarthrosis.
IMPRESSION: COPD.  No focal consolidation.

## 2017-03-10 MED ORDER — LEVOTHYROXINE SODIUM 50 MCG PO TABS: 50.0000 ug | ORAL_TABLET | Freq: Every day | ORAL | 1 refills | Status: DC

## 2017-03-10 NOTE — Addendum Note (Signed)
Addended byCarrolyn Leigh on: 03/10/2017 04:13 PM   Modules accepted: Orders

## 2017-05-25 ENCOUNTER — Other Ambulatory Visit: Payer: Self-pay | Admitting: Family Medicine

## 2017-05-25 ENCOUNTER — Encounter: Payer: Self-pay | Admitting: Family Medicine

## 2017-05-25 ENCOUNTER — Ambulatory Visit: Payer: BLUE CROSS/BLUE SHIELD | Admitting: Family Medicine

## 2017-05-25 VITALS — BP 146/83 | HR 68 | Temp 97.4°F | Ht 68.0 in | Wt 168.0 lb

## 2017-05-25 DIAGNOSIS — J441 Chronic obstructive pulmonary disease with (acute) exacerbation: Secondary | ICD-10-CM

## 2017-05-25 DIAGNOSIS — R509 Fever, unspecified: Secondary | ICD-10-CM | POA: Diagnosis not present

## 2017-05-25 LAB — VERITOR FLU A/B WAIVED
Influenza A: NEGATIVE
Influenza B: NEGATIVE

## 2017-05-25 MED ORDER — METHYLPREDNISOLONE ACETATE 80 MG/ML IJ SUSP
80.0000 mg | Freq: Once | INTRAMUSCULAR | Status: AC
  Administered 2017-05-25: 80 mg via INTRAMUSCULAR

## 2017-05-25 MED ORDER — FLUTICASONE FUROATE-VILANTEROL 100-25 MCG/INH IN AEPB: 1.0000 | INHALATION_SPRAY | Freq: Every day | RESPIRATORY_TRACT | 3 refills | Status: DC

## 2017-05-25 MED ORDER — DOXYCYCLINE HYCLATE 100 MG PO TABS
100.0000 mg | ORAL_TABLET | Freq: Two times a day (BID) | ORAL | 0 refills | Status: DC
Start: 2017-05-25 — End: 2017-08-25

## 2017-05-25 NOTE — Progress Notes (Signed)
BP (!) 146/83   Pulse 68   Temp (!) 97.4 F (36.3 C) (Oral)   Ht 5\' 8"  (1.727 m)   Wt 168 lb (76.2 kg)   BMI 25.54 kg/m    Subjective:    Patient ID: Joseph Atkins, male    DOB: 1960/12/13, 57 y.o.   MRN: [id number]  HPI: Joseph Atkins is a 57 y.o. male presenting on 05/25/2017 for Cough, runny nose, fever at night, chills (symptoms began 5 days ago, taking Mucinex and Alka-Seltzer cold plus)   HPI Patient presents to the clinic with a cough and runny nose x 9 days, but has progressive gotten worse over the past 5 days. Patient is having a productive cough with thick, white phlegm and clear drainage from his nose. Patient has tried Mucinex, Alka-Seltzer Cold plus, and halls cough drops with mild relief. Patient's wife was sick with similar symptoms 2 weeks ago, but recovered without treatment. Patient smokes about 1.5 packs a day x 25 years, but admits that he used to smoke over 2 packs a day. He was treated for pneumonia in the ED about 3 months ago. Patient admits to chills, fever (did not take temperature at home, just felt feverish), trouble sleeping due to cough, and "vomiting up phlegm". Patient denies SOB, chest pain, muscle aches, nausea, diarrhea, and ear problems.  Patient has been using his albuterol some but does not always feel like it is giving him complete relief.  He has no desire to quit smoking  Relevant past medical, surgical, family and social history reviewed and updated as indicated. Interim medical history since our last visit reviewed. Allergies and medications reviewed and updated.  Review of Systems  Constitutional: Positive for chills, fatigue and fever. Negative for diaphoresis.  HENT: Positive for congestion, postnasal drip, rhinorrhea (clear drainage), sinus pressure, sinus pain and sore throat (beginning to get scratchy from cough). Negative for ear discharge, ear pain and trouble swallowing.   Eyes: Negative for pain, discharge, redness and itching.    Respiratory: Positive for cough (productive cough with thick, white phlegm). Negative for chest tightness, shortness of breath and wheezing.   Cardiovascular: Negative for chest pain.  Gastrointestinal: Positive for vomiting ("vomiting up phlegm-not food"). Negative for diarrhea and nausea.  Musculoskeletal: Negative for myalgias.    Per HPI unless specifically indicated above     Objective:    BP (!) 146/83   Pulse 68   Temp (!) 97.4 F (36.3 C) (Oral)   Ht 5\' 8"  (1.727 m)   Wt 168 lb (76.2 kg)   BMI 25.54 kg/m   Wt Readings from Last 3 Encounters:  05/25/17 168 lb (76.2 kg)  03/08/17 164 lb (74.4 kg)  02/07/17 160 lb (72.6 kg)    Physical Exam  Constitutional: He is oriented to person, place, and time. He appears well-developed and well-nourished. He appears ill. No distress.  HENT:  Head: Normocephalic.  Right Ear: Hearing, tympanic membrane, external ear and ear canal normal.  Left Ear: Hearing, tympanic membrane, external ear and ear canal normal.  Nose: Rhinorrhea (clear discharge) present. Right sinus exhibits maxillary sinus tenderness. Right sinus exhibits no frontal sinus tenderness. Left sinus exhibits maxillary sinus tenderness. Left sinus exhibits no frontal sinus tenderness.  Mouth/Throat: Uvula is midline, oropharynx is clear and moist and mucous membranes are normal. No oropharyngeal exudate, posterior oropharyngeal edema or posterior oropharyngeal erythema.  Neck: No thyromegaly present.  Cardiovascular: Normal rate, regular rhythm and normal heart sounds. Exam reveals no  gallop and no friction rub.  No murmur heard. Pulmonary/Chest: Effort normal and breath sounds normal. No stridor. No respiratory distress. He has no wheezes. He has no rales.  Lymphadenopathy:    He has no cervical adenopathy.  Neurological: He is alert and oriented to person, place, and time.  Skin: He is not diaphoretic.  Psychiatric: He has a normal mood and affect. His behavior is  normal. Judgment and thought content normal.  Nursing note and vitals reviewed.     Assessment & Plan:  Rapid Flu: Negative  Problem List Items Addressed This Visit    None    Visit Diagnoses    COPD exacerbation ([place])    -  Primary   Relevant Medications   fluticasone furoate-vilanterol (BREO ELLIPTA) 100-25 MCG/INH AEPB   doxycycline (VIBRA-TABS) 100 MG tablet   methylPREDNISolone acetate (DEPO-MEDROL) injection 80 mg (Completed)   Fever, unspecified fever cause       Relevant Medications   fluticasone furoate-vilanterol (BREO ELLIPTA) 100-25 MCG/INH AEPB   doxycycline (VIBRA-TABS) 100 MG tablet   methylPREDNISolone acetate (DEPO-MEDROL) injection 80 mg (Completed)   Other Relevant Orders   Veritor Flu A/B Waived    COPD Exacerbation: Patient presents to clinic with signs and symptoms suggestive of a COPD exacerbation. Patient has a productive cough with thick, white phlegm x 9 days. On physical exam, patient had mild tenderness to palpation over the maxillary sinuses and evidence of post-nasal drip. Patient was prescribed doxycycline 100mg  tablets po BID x 10 days. He was given a Depo-medrol 80mg  injection in clinic today. Patient was also prescribed BREO Ellipta 100-25 MCG/INH 1 puff daily as a maintenance inhaler to help control his COPD and breathing problems. Patient has been educated on the effects smoking has on his breathing, but he is not ready to quit yet. Patient has been instructed to call the clinic if his symptoms do not improve after treatment. Patient was given a note for work for Friday and Saturday. Patient will receive a baseline chest x-ray at his next adult wellness visit.  Follow up plan: Return if symptoms worsen or fail to improve.  Counseling provided for all of the vaccine components Orders Placed This Encounter  Procedures  . Veritor Flu A/B Waived   Patient was seen and examined with Lanna Poche PA student, agree with assessment and plan  above. Caryl Pina, MD [facility] 05/25/2017, 3:33 PM

## 2017-06-01 ENCOUNTER — Other Ambulatory Visit: Payer: Self-pay | Admitting: Family Medicine

## 2017-06-01 MED ORDER — NICOTINE 21 MG/24HR TD PT24: 21.0000 mg | MEDICATED_PATCH | Freq: Every day | TRANSDERMAL | 0 refills | Status: DC

## 2017-06-01 NOTE — Telephone Encounter (Signed)
Please let the patient know that I have sent in nicotine patches for him, I have sent the first month and if it goes well then I can send the second month as the same dose or lower dose depending on how it goes.

## 2017-06-01 NOTE — Telephone Encounter (Signed)
Pt notified of RX 

## 2017-08-25 ENCOUNTER — Ambulatory Visit: Payer: BLUE CROSS/BLUE SHIELD | Admitting: Family

## 2017-08-25 ENCOUNTER — Encounter: Payer: Self-pay | Admitting: Family

## 2017-08-25 VITALS — BP 138/79 | HR 68 | Temp 97.0°F | Ht 68.0 in | Wt 172.6 lb

## 2017-08-25 DIAGNOSIS — S50861A Insect bite (nonvenomous) of right forearm, initial encounter: Secondary | ICD-10-CM

## 2017-08-25 DIAGNOSIS — W57XXXA Bitten or stung by nonvenomous insect and other nonvenomous arthropods, initial encounter: Secondary | ICD-10-CM | POA: Diagnosis not present

## 2017-08-25 MED ORDER — PREDNISONE 10 MG (21) PO TBPK: ORAL_TABLET | ORAL | 0 refills | Status: DC

## 2017-08-25 MED ORDER — CEPHALEXIN 500 MG PO CAPS: 500.0000 mg | ORAL_CAPSULE | Freq: Two times a day (BID) | ORAL | 0 refills | Status: DC

## 2017-08-25 NOTE — Patient Instructions (Signed)
Insect Bite, Adult An insect bite can make your skin red, itchy, and swollen. An insect bite is different from an insect sting, which happens when an insect injects poison (venom) into the skin. Some insects can spread disease to people through a bite. However, most insect bites do not lead to disease and are not serious. What are the causes? Insects may bite for a variety of reasons, including:  Hunger.  To defend themselves.  Insects that bite include:  Spiders.  Mosquitoes.  Ticks.  Fleas.  Ants.  Flies.  Bedbugs.  What are the signs or symptoms? Symptoms of this condition include:  Itching or pain in the bite area.  Redness and swelling in the bite area.  An open wound (skin ulcer).  In many cases, symptoms last for 2-4 days. How is this diagnosed? This condition is usually diagnosed based on symptoms and a physical exam. How is this treated? Treatment is usually not needed. Symptoms often go away on their own. When treatment is recommended, it may involve:  Applying a cream or lotion to the bitten area. This treatment helps with itching.  Taking an antibiotic medicine. This treatment is needed if the bite area gets infected.  Getting a tetanus shot.  Applying ice to the affected area.  Medicines called antihistamines. This treatment is needed if you develop an allergic reaction to the insect bite.  Follow these instructions at home: Bite area care  Do not scratch the bite area.  Keep the bite area clean and dry. Wash it every day with soap and water as told by your health care provider.  Check the bite area every day for signs of infection. Check for: ? More redness, swelling, or pain. ? Fluid or blood. ? Warmth. ? Pus. Managing pain, itching, and swelling   You may apply a baking soda paste, cortisone cream, or calamine lotion to the bite area as told by your health care provider.  If directed, applyice to the bite area. ? Put ice in a  plastic bag. ? Place a towel between your skin and the bag. ? Leave the ice on for 20 minutes, 2-3 times per day. Medicines  Apply or take over-the-counter and prescription medicines only as told by your health care provider.  If you were prescribed an antibiotic medicine, use it as told by your health care provider. Do not stop using the antibiotic even if your condition improves. General instructions  Keep all follow-up visits as told by your health care provider. This is important. How is this prevented? To help reduce your risk of insect bites:  When you are outdoors, wear clothing that covers your arms and legs.  Use insect repellent. The best insect repellents contain: ? DEET, picaridin, oil of lemon eucalyptus (OLE), or IR3535. ? Higher amounts of an active ingredient.  If your home windows do not have screens, consider installing them.  Contact a health care provider if:  You have more redness, swelling, or pain in the bite area.  You have fluid, blood, or pus coming from the bite area.  The bite area feels warm to the touch.  You have a fever. Get help right away if:  You have joint pain.  You have a rash.  You have shortness of breath.  You feel unusually tired or sleepy.  You have neck pain.  You have a headache.  You have unusual weakness.  You have chest pain.  You have nausea, vomiting, or pain in the abdomen. This  information is not intended to replace advice given to you by your health care provider. Make sure you discuss any questions you have with your health care provider. Document Released: 05/19/2004 Document Revised: 12/09/2015 Document Reviewed: 10/19/2015 Elsevier Interactive Patient Education  [employer].

## 2017-08-25 NOTE — Progress Notes (Signed)
   Subjective:    Patient ID: Joseph Atkins, male    DOB: 03-01-1961, 57 y.o.   MRN: [id number]  HPI PT presents to the office today for an insect bite that happen yesterday while mowing his grass. He reports he was mowing last night and felt something bite his back and then noticed the area was red and swollen, but states he went a took a shower and forgot about it.   However, he woke up at 12:30 AM with right forearm swelling, redness, pain, and pruritis. States he went to work this morning thinking if he used his arm it would help, but states the swelling is slightly worse and now has a headache. Reports constant aching pain of 5 out 10 in his right arm.    Review of Systems  Skin: Positive for wound.  All other systems reviewed and are negative.      Objective:   Physical Exam  Constitutional: He is oriented to person, place, and time. He appears well-developed and well-nourished. No distress.  HENT:  Head: Normocephalic.  Right Ear: External ear normal.  Left Ear: External ear normal.  Mouth/Throat: Oropharynx is clear and moist.  Eyes: Pupils are equal, round, and reactive to light. Right eye exhibits no discharge. Left eye exhibits no discharge.  Neck: Normal range of motion. Neck supple. No thyromegaly present.  Cardiovascular: Normal rate, regular rhythm, normal heart sounds and intact distal pulses.  No murmur heard. Pulmonary/Chest: Effort normal and breath sounds normal. No respiratory distress. He has no wheezes.  Abdominal: Soft. Bowel sounds are normal. He exhibits no distension. There is no tenderness.  Musculoskeletal: Normal range of motion. He exhibits no edema or tenderness.  Neurological: He is alert and oriented to person, place, and time. He has normal reflexes. No cranial nerve deficit.  Skin: Skin is warm and dry. No rash noted. There is erythema.  Right arm erythemas, swelling, and slight warmth  Psychiatric: He has a normal mood and affect. His behavior  is normal. Judgment and thought content normal.  Vitals reviewed.    BP 138/79   Pulse 68   Temp (!) 97 F (36.1 C) (Oral)   Ht 5\' 8"  (1.727 m)   Wt 172 lb 9.6 oz (78.3 kg)   BMI 26.24 kg/m      Assessment & Plan:  Bevan was seen today for right arm swollen.  Diagnoses and all orders for this visit:  Insect bite of right forearm, initial encounter -     cephALEXin (KEFLEX) 500 MG capsule; Take 1 capsule (500 mg total) by mouth 2 (two) times daily. -     predniSONE (STERAPRED UNI-PAK 21 TAB) 10 MG (21) TBPK tablet; Use as directed    -Pt to report any new fever, joint pain, rash, or if erythemas of arm becomes worse -Wear protective clothing while outside- Long sleeves and long pants -Put insect repellent on all exposed skin and along clothing -Take a shower as soon as possible after being outside -RTO if redness does not improve or worsen  Evelina Dun, FNP

## 2017-09-06 ENCOUNTER — Ambulatory Visit: Payer: BLUE CROSS/BLUE SHIELD | Admitting: Family Medicine

## 2017-09-07 ENCOUNTER — Ambulatory Visit: Payer: BLUE CROSS/BLUE SHIELD | Admitting: Family Medicine

## 2018-01-22 ENCOUNTER — Ambulatory Visit (INDEPENDENT_AMBULATORY_CARE_PROVIDER_SITE_OTHER): Payer: Worker's Compensation

## 2018-01-22 ENCOUNTER — Ambulatory Visit (INDEPENDENT_AMBULATORY_CARE_PROVIDER_SITE_OTHER): Payer: Worker's Compensation | Admitting: Nurse Practitioner

## 2018-01-22 ENCOUNTER — Encounter: Payer: Self-pay | Admitting: Nurse Practitioner

## 2018-01-22 VITALS — BP 155/83 | HR 64 | Temp 97.3°F | Ht 68.0 in | Wt 183.0 lb

## 2018-01-22 DIAGNOSIS — S63502A Unspecified sprain of left wrist, initial encounter: Secondary | ICD-10-CM

## 2018-01-22 DIAGNOSIS — M25532 Pain in left wrist: Secondary | ICD-10-CM

## 2018-01-22 IMAGING — DX DG WRIST COMPLETE 3+V*L*
3 series · 3 of 3 positions shown · non-contrast
Comparison: None.

CLINICAL DATA: LEFT wrist pain.

EXAM:
LEFT WRIST - COMPLETE 3+ VIEW

[wrist ap (1 of 2)]
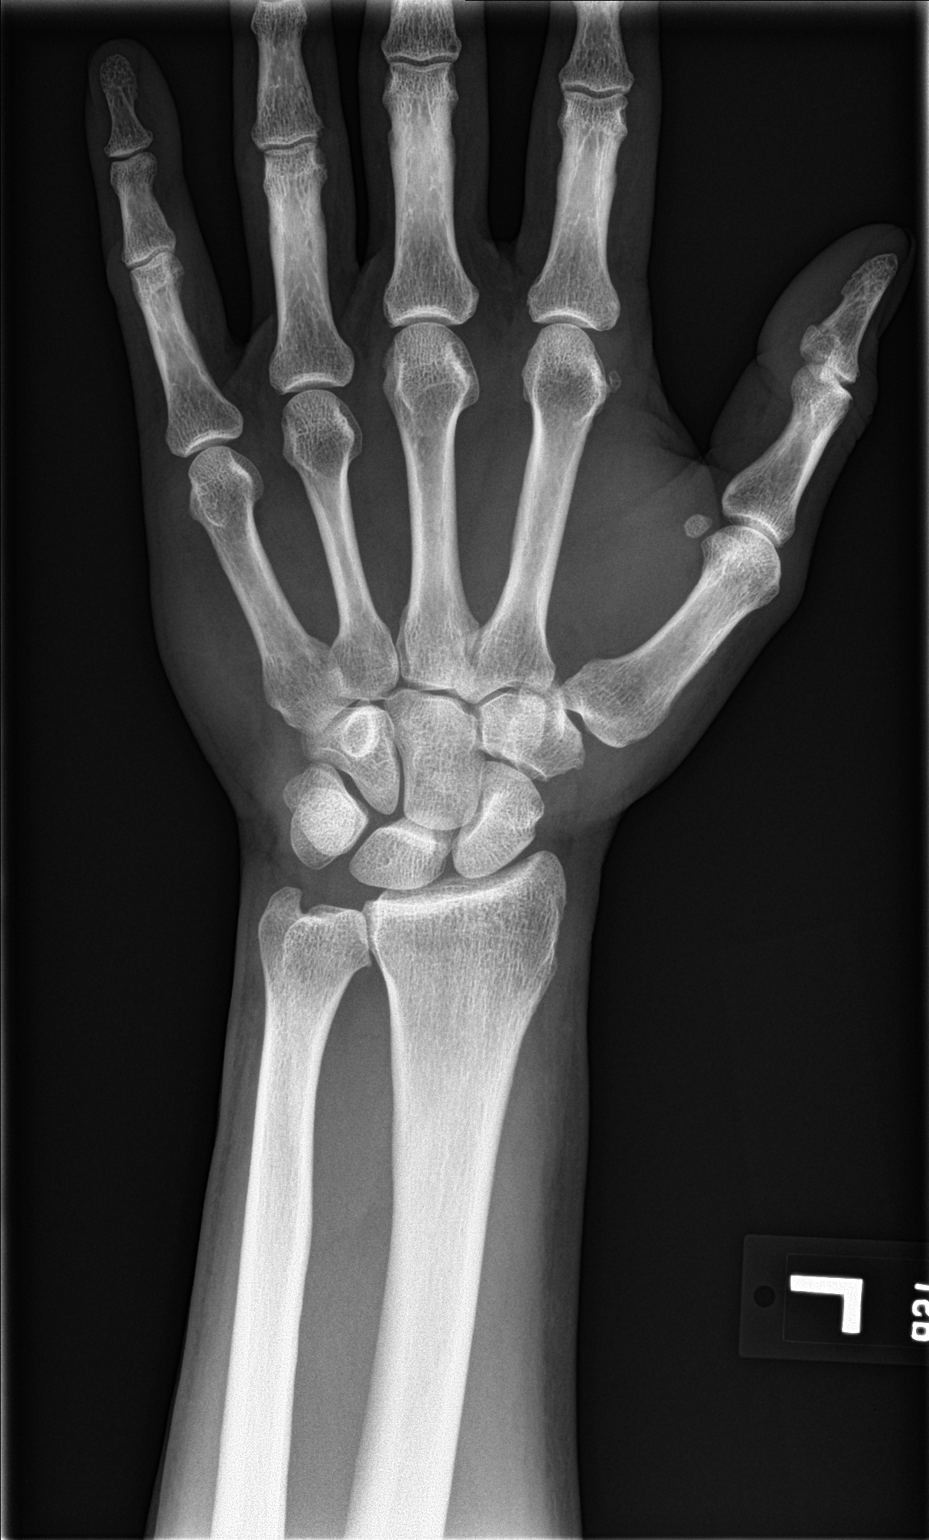

[wrist lat]
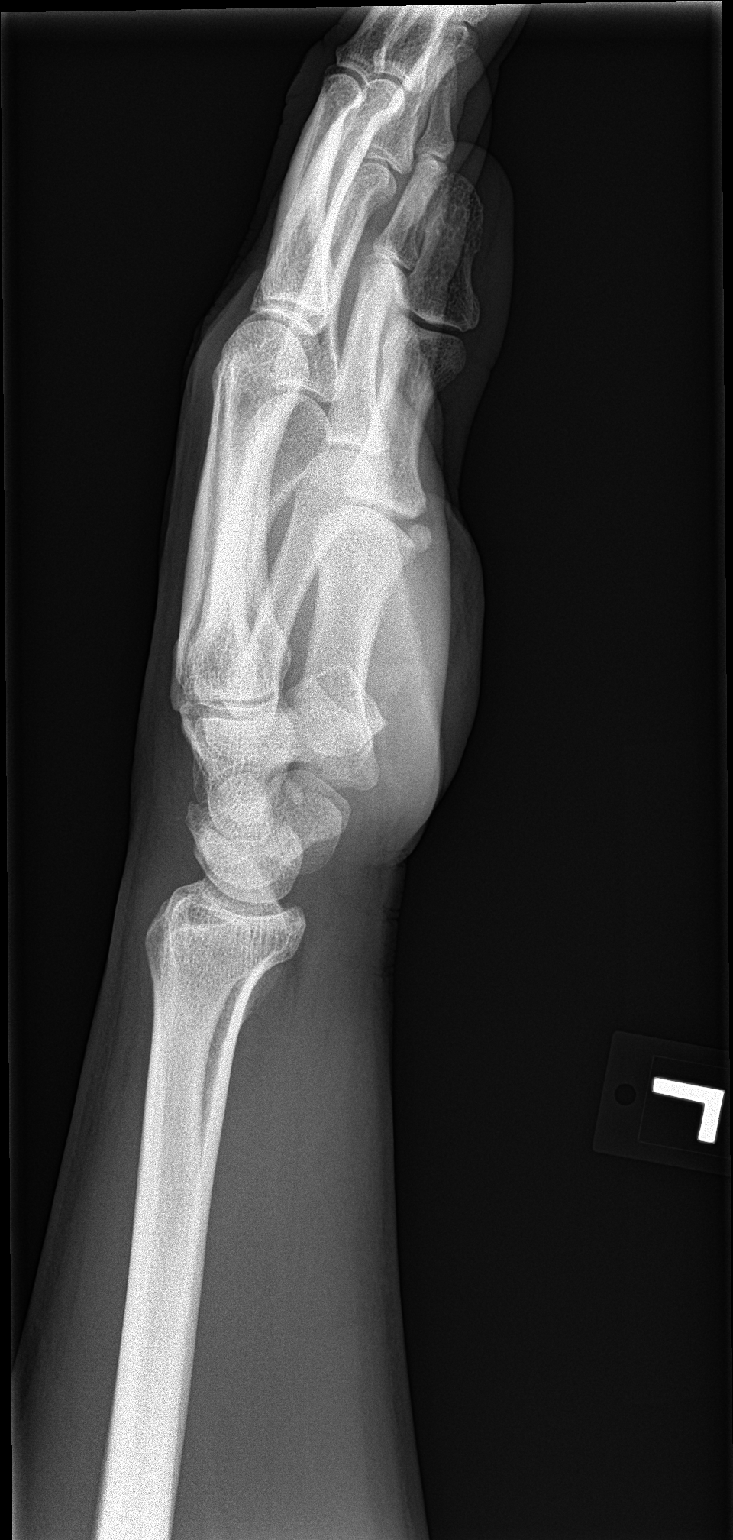

[wrist ap (2 of 2)]
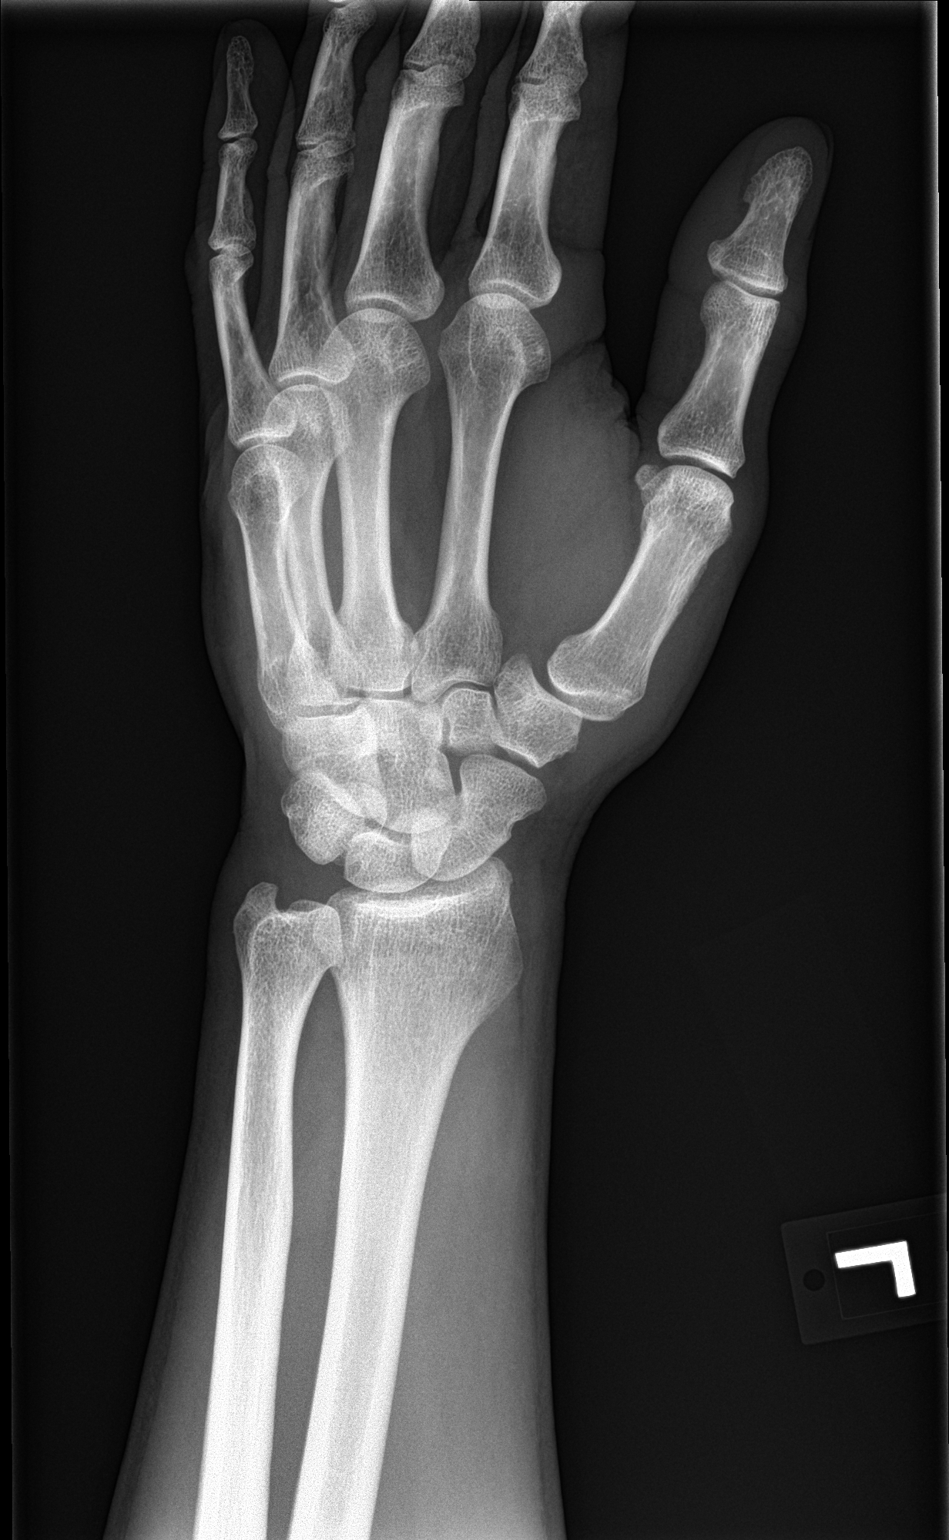

[3 of 3 positions shown; findings below may reference images not displayed]

FINDINGS: There is no evidence of fracture or dislocation. There is no
evidence of arthropathy or other focal bone abnormality. Soft
tissues are unremarkable.
IMPRESSION: Negative.

## 2018-01-22 NOTE — Patient Instructions (Signed)
Wrist Sprain, Adult A wrist sprain is a stretch or tear in the strong, fibrous tissues (ligaments) that connect your wrist bones. There are three types of wrist sprains:  Grade 1. In this type of sprain, the ligament is stretched more than normal.  Grade 2. In this type of sprain, the ligament is partially torn. You may be able to move your wrist, but not very much.  Grade 3. In this type of sprain, the ligament or muscle is completely torn. You may find it difficult or extremely painful to move your wrist even a little.  What are the causes? A wrist sprain can be caused by using the wrist too much during sports, exercise, or at work. It can also happen with a fall or during an accident. What increases the risk? This condition is more likely to occur in people:  With a previous wrist or arm injury.  With poor wrist strength and flexibility.  Who play contact sports, such as football or soccer.  Who play sports that may result in a fall, such as skateboarding, biking, skiing, or snowboarding.  Who do not exercise regularly.  Who use exercise equipment that does not fit well.  What are the signs or symptoms? Symptoms of this condition include:  Pain in the wrist, arm, or hand.  Swelling or bruised skin near the wrist, hand, or arm. The skin may look yellow or kind of blue.  Stiffness or trouble moving the hand.  Hearing a pop or feeling a tear at the time of the injury.  A warm feeling in the skin around the wrist.  How is this diagnosed? This condition is diagnosed with a physical exam. Sometimes an X-ray is taken to make sure a bone did not break. If your health care provider thinks that you tore a ligament, he or she may order an MRI of your wrist. How is this treated? This condition is treated by resting and applying ice to your wrist. Additional treatment may include:  Medicine for pain and inflammation.  A splint to keep your wrist still (immobilized).  Exercises  to strengthen and stretch your wrist.  Surgery. This may be done if the ligament is completely torn.  Follow these instructions at home: If you have a splint:   Do not put pressure on any part of the splint until it is fully hardened. This may take several hours.  Wear the splint as told by your health care provider. Remove it only as told by your health care provider.  Loosen the splint if your fingers tingle, become numb, or turn cold and blue.  If your splint is not waterproof: ? Do not let it get wet. ? Cover it with a watertight covering when you take a bath or a shower.  Keep the splint clean. Managing pain, stiffness, and swelling   If directed, put ice on the injured area. ? If you have a removable splint, remove it as told by your health care provider. ? Put ice in a plastic bag. ? Place a towel between your skin and the bag or between the splint and the bag. ? Leave the ice on for 20 minutes, 2-3 times per day.  Move your fingers often to avoid stiffness and to lessen swelling.  Raise (elevate) the injured area above the level of your heart while you are sitting or lying down. Activity  Rest your wrist. Do not do things that cause pain.  Return to your normal activities as told by  your health care provider. Ask your health care provider what activities are safe for you.  Do exercises as told by your health care provider. General instructions  Take over-the-counter and prescription medicines only as told by your health care provider.  Do not use any products that contain nicotine or tobacco, such as cigarettes and e-cigarettes. These can delay healing. If you need help quitting, ask your health care provider.  Ask your health care provider when it is safe to drive if you have a splint.  Keep all follow-up visits as told by your health care provider. This is important. Contact a health care provider if:  Your pain, bruising, or swelling gets worse.  Your  skin becomes red, gets a rash, or has open sores.  Your pain does not get better or it gets worse. Get help right away if:  You have a new or sudden sharp pain in the hand, arm, or wrist.  You have tingling or numbness in your hand.  Your fingers turn white, very red, or cold and blue.  You cannot move your fingers. This information is not intended to replace advice given to you by your health care provider. Make sure you discuss any questions you have with your health care provider. Document Released: 12/13/2013 Document Revised: 11/07/2015 Document Reviewed: 10/29/2015 Elsevier Interactive Patient Education  [employer].

## 2018-01-22 NOTE — Progress Notes (Signed)
   Subjective:    Patient ID: Joseph Atkins, male    DOB: 1960-08-13, 57 y.o.   MRN: [id number]   Joseph Atkins comes in today for workers comp injury.  DATE OF INJURY: 01/22/18   EMPLOYER: [employer]  EXPLANATION OF INJURY: Patient says that he fell over a tire and stopped hisself with fork lift, landing on left hand. Pain is on bilateral sides of wrist.    Review of Systems  Respiratory: Negative.   Cardiovascular: Negative.   Musculoskeletal: Positive for arthralgias (left wrist).  Neurological: Negative.   All other systems reviewed and are negative.      Objective:   Physical Exam  Constitutional: He appears well-developed and well-nourished. No distress.  Cardiovascular: Normal rate and regular rhythm.  Pulmonary/Chest: Effort normal and breath sounds normal.  Musculoskeletal: Normal range of motion.  FROM of left wrist with pain flexion and extension Pain along base of left thumb on palpation   BP (!) 155/83   Pulse 64   Temp (!) 97.3 F (36.3 C) (Oral)   Ht 5\' 8"  (1.727 m)   Wt 183 lb (83 kg)   BMI 27.83 kg/m   Left wrist xray- no fracture-Preliminary reading by Ronnald Collum, FNP  [facility]       Assessment & Plan:  Joseph Atkins in today with chief complaint of Left hand pain   1. Left wrist pain - DG Wrist Complete Left  2. Sprain of left wrist, initial encounter Ice Rest Brace when working Tylenol OTC for pain  Mary-Margaret Hassell Done, FNP

## 2018-08-28 ENCOUNTER — Telehealth: Payer: Self-pay | Admitting: Family Medicine

## 2019-10-18 ENCOUNTER — Emergency Department (HOSPITAL_COMMUNITY): Payer: Managed Care, Other (non HMO)

## 2019-10-18 ENCOUNTER — Emergency Department (HOSPITAL_COMMUNITY)
Admission: EM | Admit: 2019-10-18 | Discharge: 2019-10-18 | Disposition: A | Discharge status: Home / Self Care | Payer: Managed Care, Other (non HMO) | Attending: Emergency Medicine | Admitting: Emergency Medicine

## 2019-10-18 ENCOUNTER — Other Ambulatory Visit: Payer: Self-pay

## 2019-10-18 ENCOUNTER — Encounter (HOSPITAL_COMMUNITY): Payer: Self-pay | Admitting: *Deleted

## 2019-10-18 DIAGNOSIS — C3490 Malignant neoplasm of unspecified part of unspecified bronchus or lung: Secondary | ICD-10-CM | POA: Diagnosis not present

## 2019-10-18 DIAGNOSIS — R519 Headache, unspecified: Secondary | ICD-10-CM

## 2019-10-18 DIAGNOSIS — F17210 Nicotine dependence, cigarettes, uncomplicated: Secondary | ICD-10-CM | POA: Diagnosis not present

## 2019-10-18 DIAGNOSIS — R42 Dizziness and giddiness: Secondary | ICD-10-CM | POA: Diagnosis present

## 2019-10-18 DIAGNOSIS — C3491 Malignant neoplasm of unspecified part of right bronchus or lung: Secondary | ICD-10-CM

## 2019-10-18 DIAGNOSIS — Z79899 Other long term (current) drug therapy: Secondary | ICD-10-CM | POA: Insufficient documentation

## 2019-10-18 DIAGNOSIS — E785 Hyperlipidemia, unspecified: Secondary | ICD-10-CM | POA: Diagnosis not present

## 2019-10-18 DIAGNOSIS — Z20822 Contact with and (suspected) exposure to covid-19: Secondary | ICD-10-CM | POA: Diagnosis not present

## 2019-10-18 LAB — COMPREHENSIVE METABOLIC PANEL
ALT: 32 U/L (ref 0–44)
AST: 25 U/L (ref 15–41)
Albumin: 4.6 g/dL (ref 3.5–5.0)
Alkaline Phosphatase: 90 U/L (ref 38–126)
Anion gap: 11 (ref 5–15)
BUN: 14 mg/dL (ref 6–20)
CO2: 28 mmol/L (ref 22–32)
Calcium: 9.2 mg/dL (ref 8.9–10.3)
Chloride: 95 mmol/L — ABNORMAL LOW (ref 98–111)
Creatinine, Ser: 0.84 mg/dL (ref 0.61–1.24)
GFR calc Af Amer: >60 mL/min (ref >60)
GFR calc non Af Amer: >60 mL/min (ref >60)
Glucose, Bld: 109 mg/dL — ABNORMAL HIGH (ref 70–99)
Potassium: 3.9 mmol/L (ref 3.5–5.1)
Sodium: 134 mmol/L — ABNORMAL LOW (ref 135–145)
Total Bilirubin: 0.7 mg/dL (ref 0.3–1.2)
Total Protein: 8.5 g/dL — ABNORMAL HIGH (ref 6.5–8.1)

## 2019-10-18 LAB — CBC WITH DIFFERENTIAL/PLATELET
Abs Immature Granulocytes: 0.06 10*3/uL (ref 0.00–0.07)
Basophils Absolute: 0.1 10*3/uL (ref 0.0–0.1)
Basophils Relative: 1 %
Eosinophils Absolute: 0.1 10*3/uL (ref 0.0–0.5)
Eosinophils Relative: 1 %
HCT: 42.5 % (ref 39.0–52.0)
Hemoglobin: 13.2 g/dL (ref 13.0–17.0)
Immature Granulocytes: 1 %
Lymphocytes Relative: 16 %
Lymphs Abs: 2 10*3/uL (ref 0.7–4.0)
MCH: 28.4 pg (ref 26.0–34.0)
MCHC: 31.1 g/dL (ref 30.0–36.0)
MCV: 91.4 fL (ref 80.0–100.0)
Monocytes Absolute: 0.8 10*3/uL (ref 0.1–1.0)
Monocytes Relative: 7 %
Neutro Abs: 9.1 10*3/uL — ABNORMAL HIGH (ref 1.7–7.7)
Neutrophils Relative %: 74 %
Platelets: 372 10*3/uL (ref 150–400)
RBC: 4.65 MIL/uL (ref 4.22–5.81)
RDW: 14.1 % (ref 11.5–15.5)
WBC: 12.1 10*3/uL — ABNORMAL HIGH (ref 4.0–10.5)
nRBC: 0 % (ref 0.0–0.2)

## 2019-10-18 LAB — SARS CORONAVIRUS 2 BY RT PCR (HOSPITAL ORDER, PERFORMED IN ~~LOC~~ HOSPITAL LAB): SARS Coronavirus 2: NEGATIVE

## 2019-10-18 IMAGING — MR MR HEAD WO/W CM
9 of 13 series · 29 of 48 positions shown · IV contrast (9 ml gadavist)
Comparison: CT head [DATE]

CLINICAL DATA: Dizziness.  Brain lesion on CT.

EXAM:
MRI HEAD WITHOUT AND WITH CONTRAST
TECHNIQUE: Multiplanar, multiecho pulse sequences of the brain and surrounding
structures were obtained without and with intravenous contrast.
CONTRAST:  9mL GADAVIST GADOBUTROL 1 MMOL/ML IV SOLN

[Series 2: T1 · sagittal · 5.0mm · 0.41mm/px · 1 of 21 slices shown]
[im 1/21]
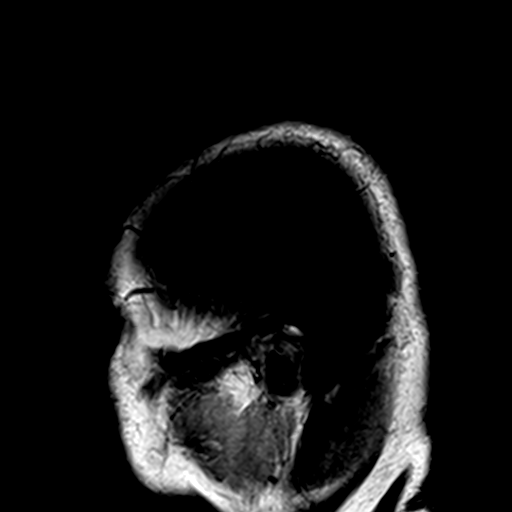

[Series 3: DWI · axial · 3.0mm · 0.73mm/px · z∈[-46,+115]mm · 8 of 110 slices shown (1 of 2)]
[im 1/110]
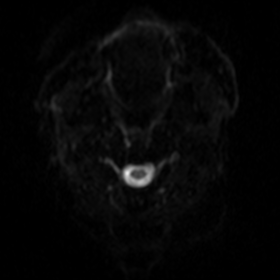
[im 16/110]
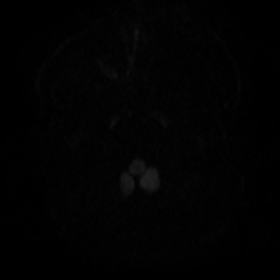
[im 32/110]
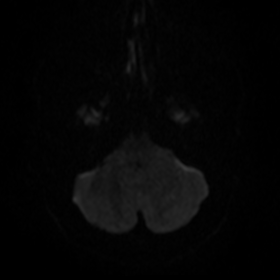
[im 47/110]
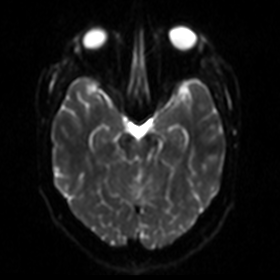
[im 63/110]
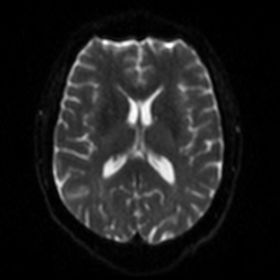
[im 78/110]
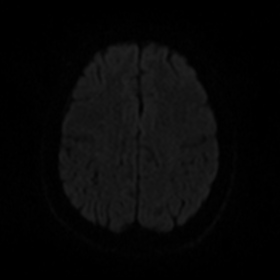
[im 94/110]
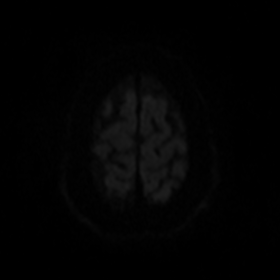
[im 110/110]
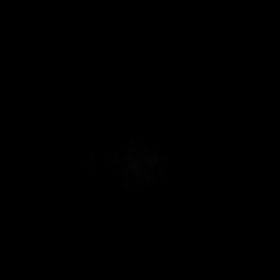

[Series 5: DWI · coronal · 5.0mm · 0.54mm/px · 6 of 76 slices shown (2 of 2)]
[im 1/76]
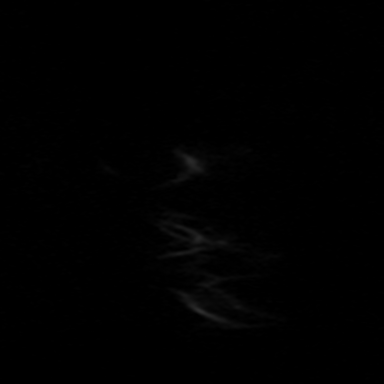
[im 16/76]
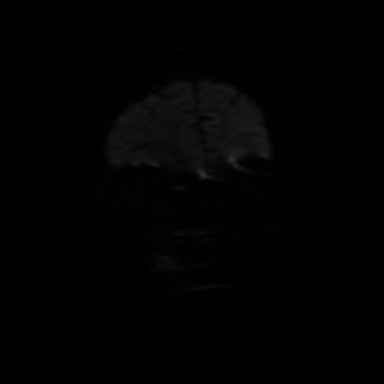
[im 31/76]
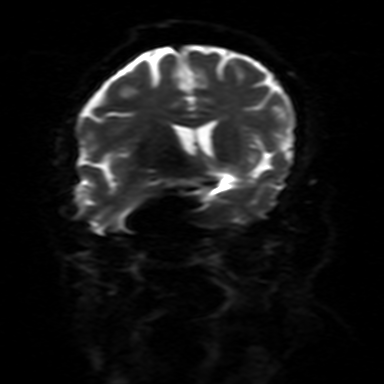
[im 46/76]
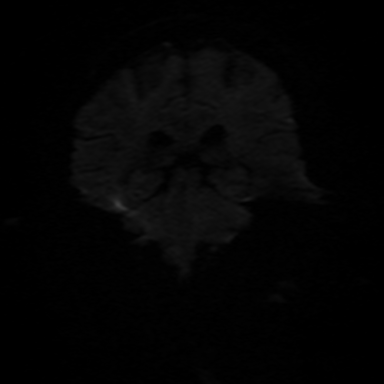
[im 61/76]
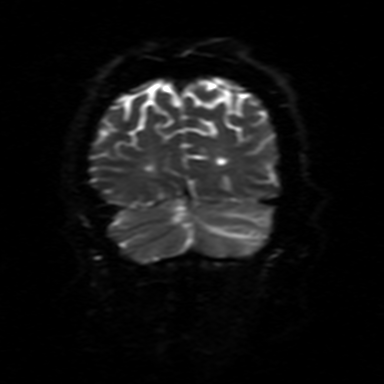
[im 76/76]
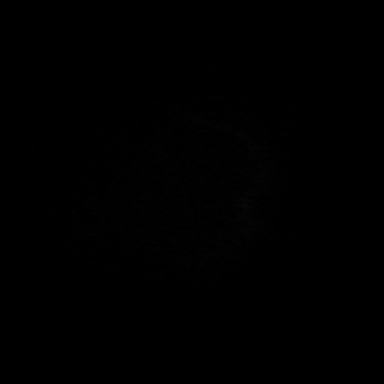

[Series 7: T2 · axial · 5.0mm · 0.53mm/px · z∈[-36,+106]mm · 2 of 23 slices shown (1 of 3)]
[im 1/23]
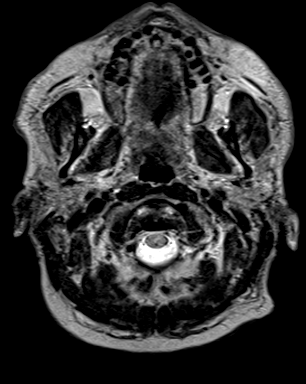
[im 23/23]
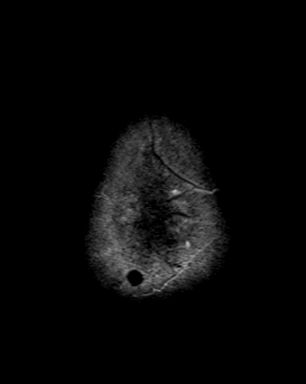

[Series 8: T2 · axial · 4.0mm · 0.40mm/px · z∈[-37,+107]mm · 2 of 30 slices shown (2 of 3)]
[im 1/30]
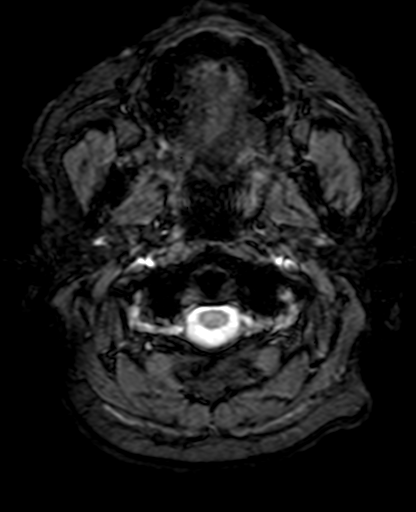
[im 30/30]
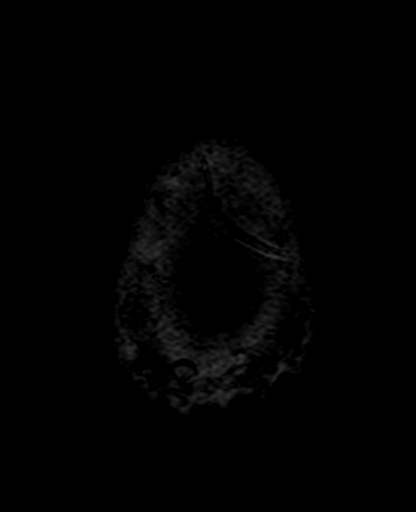

[Series 9: FLAIR · axial · 3.0mm · 0.32mm/px · z∈[-34,+103]mm · 4 of 47 slices shown]
[im 1/47]
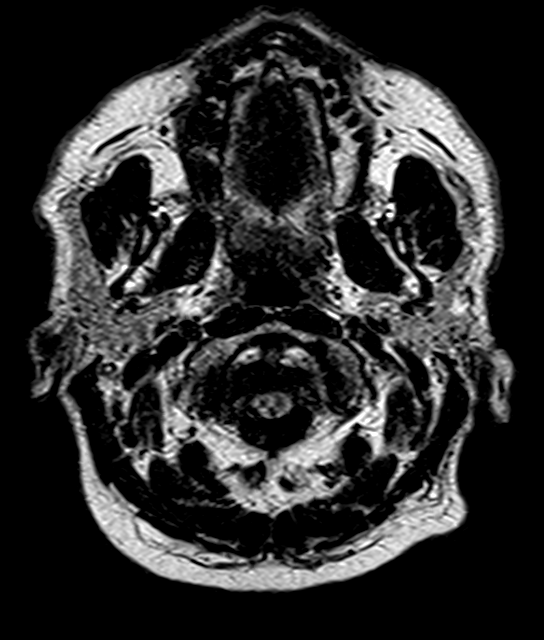
[im 16/47]
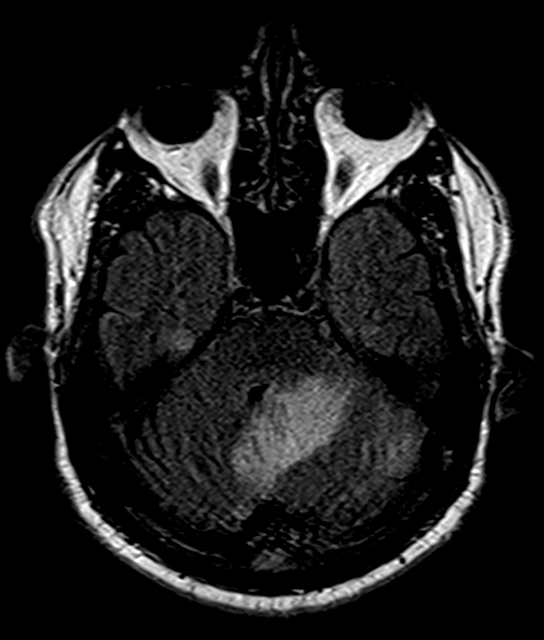
[im 31/47]
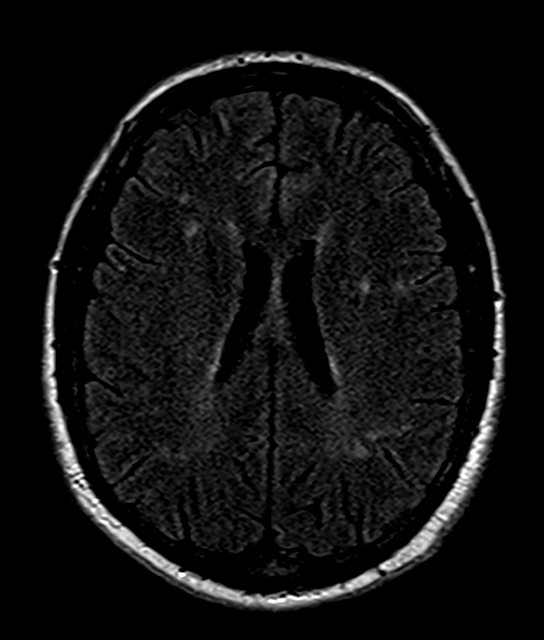
[im 47/47]
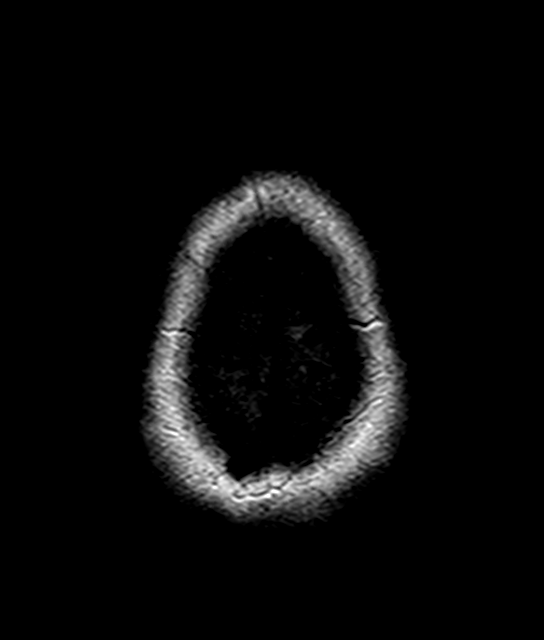

[Series 11: T2 · coronal · 5.0mm · 0.59mm/px · 2 of 28 slices shown (3 of 3)]
[im 1/28]
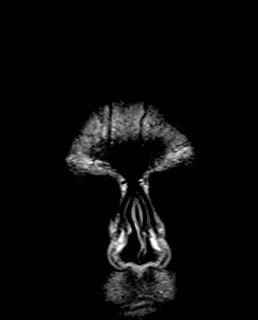
[im 28/28]
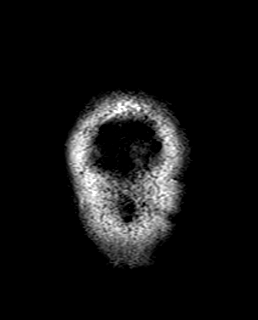

[Series 13: T1 post-contrast · coronal · 5.0mm · 0.36mm/px · 2 of 28 slices shown (1 of 2)]
[im 1/28]
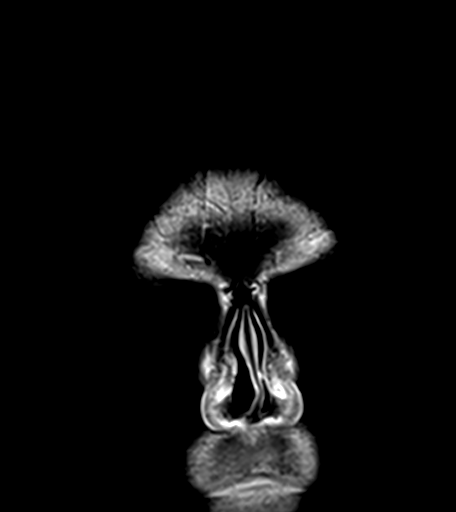
[im 28/28]
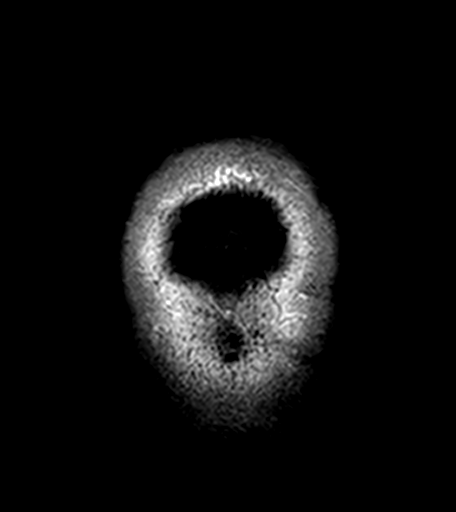

[Series 14: T1 post-contrast · sagittal · 5.0mm · 0.41mm/px · 2 of 21 slices shown (2 of 2)]
[im 1/21]
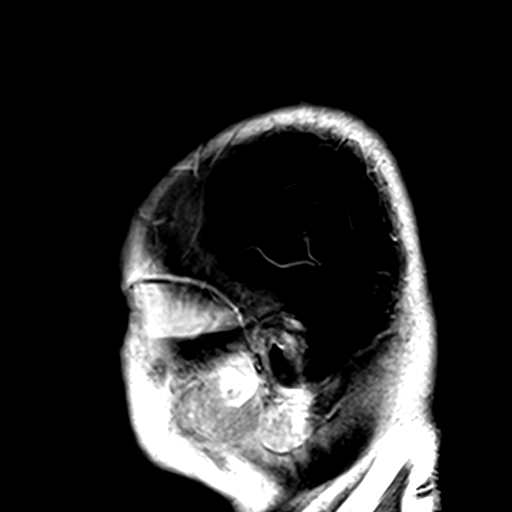
[im 21/21]
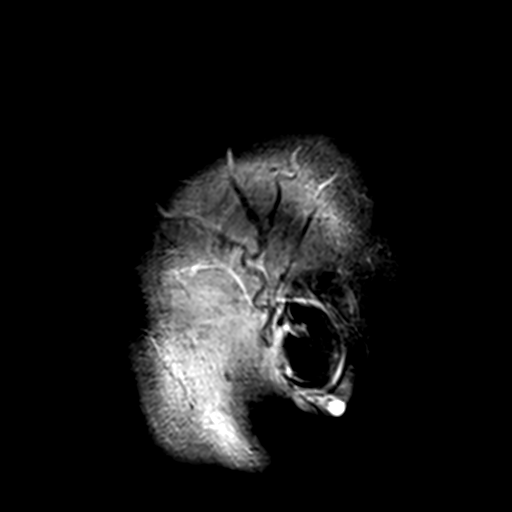

[29 of 48 positions shown; findings below may reference images not displayed]

FINDINGS: Brain: Left cerebellar mass lesion demonstrates rim enhancement and
central necrosis. There is a moderate to large amount of surrounding
white matter edema with mass-effect on the fourth ventricle. No
obstruction of the ventricles. The wall of the mass does show some
mild restricted diffusion but the central necrotic area does not. No
other mass lesions identified. Small developmental venous anomaly in
the right frontal lobe.

Ventricle size is normal. Negative for acute infarct. Mild chronic
microvascular ischemic change in the white matter.

Vascular: Normal arterial flow voids

Skull and upper cervical spine: No focal skeletal lesion.

Sinuses/Orbits: Mild mucosal edema paranasal sinuses. Negative
orbit.

Other: None
IMPRESSION: 2 cm rim enhancing mass left cerebellar with surrounding white
matter edema. No second lesion identified. This is most consistent
with metastatic disease and workup for malignancy is recommended.
Brain abscess considered less likely due to lack of central
restricted diffusion and location.

## 2019-10-18 IMAGING — CT CT ABD-PELV W/ CM
2 of 5 series · 12 of 36 positions shown, 15 images · IV contrast (Omnipaque or Isovue)
Comparison: MR head [DATE]. Chest radiographs [DATE] and
abdominopelvic CT [DATE].

CLINICAL DATA: Intermittent dizziness with headaches for 2 weeks.
Rim enhancing left cerebellar mass. Evaluate for metastatic disease.

EXAM:
CT CHEST, ABDOMEN, AND PELVIS WITH CONTRAST
TECHNIQUE: Multidetector CT imaging of the chest, abdomen and pelvis was
performed following the standard protocol during bolus
administration of intravenous contrast.
CONTRAST:  100mL OMNIPAQUE IOHEXOL 300 MG/ML  SOLN

[Series 2: cap with · axial · 0.81mm/px · z∈[+1138,+1693]mm · 9 of 139 slices shown, 12 images]
[im 14/139  mediastinal]
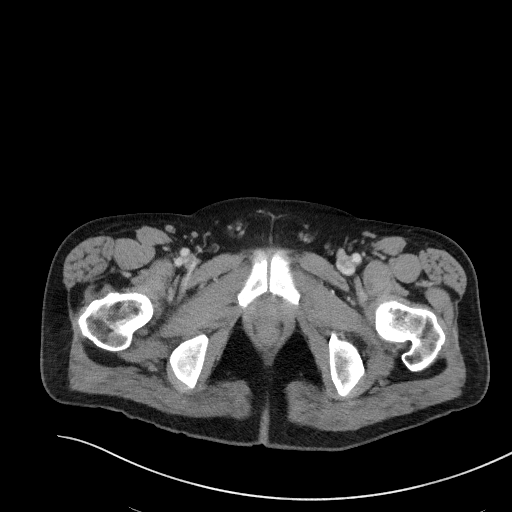
[im 14/139  lung]
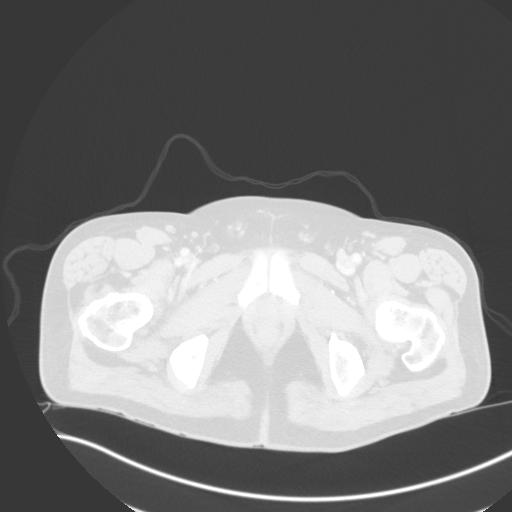
[im 28/139  lung]
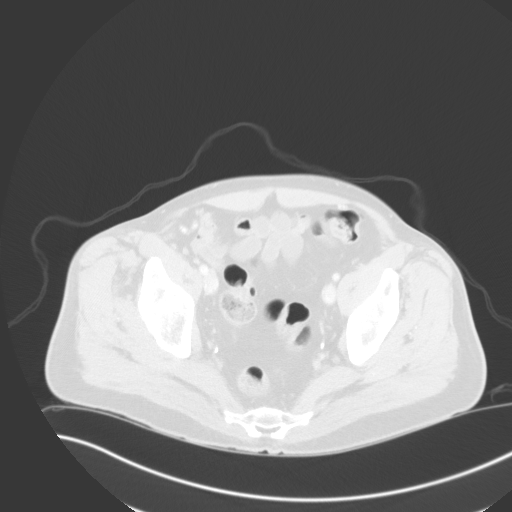
[im 42/139  lung]
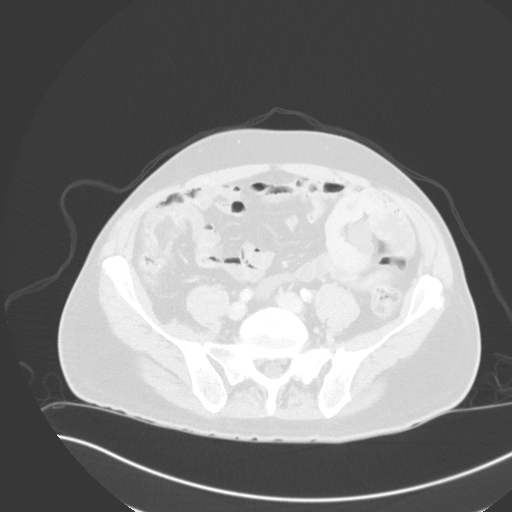
[im 56/139  lung]
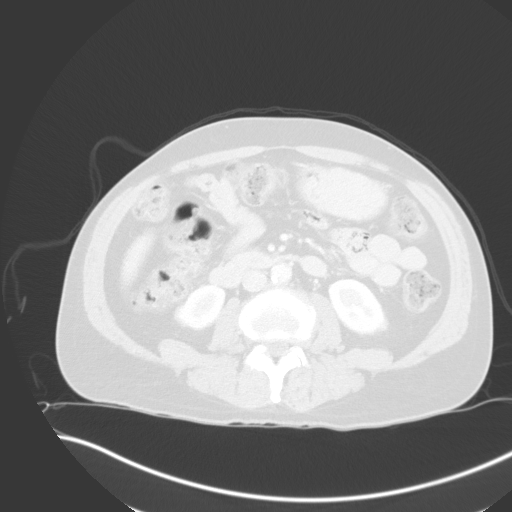
[im 70/139  mediastinal]
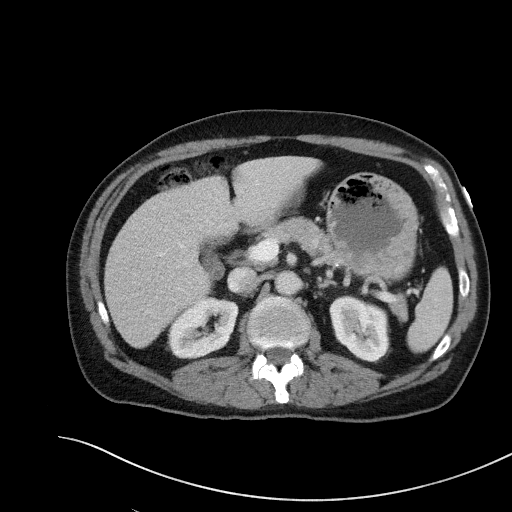
[im 70/139  lung]
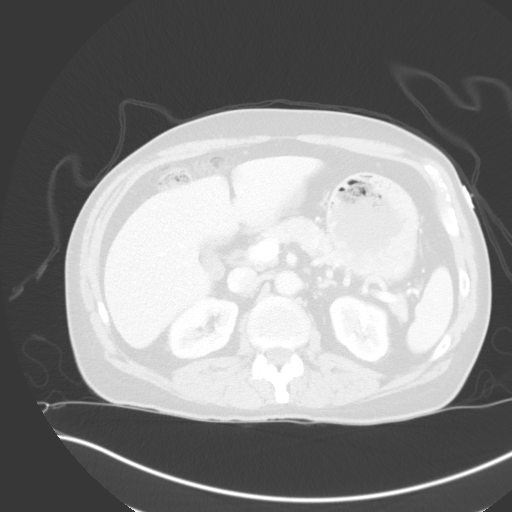
[im 83/139  lung]
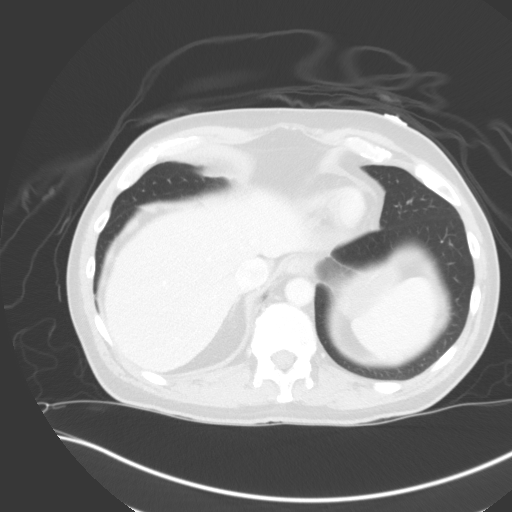
[im 97/139  lung]
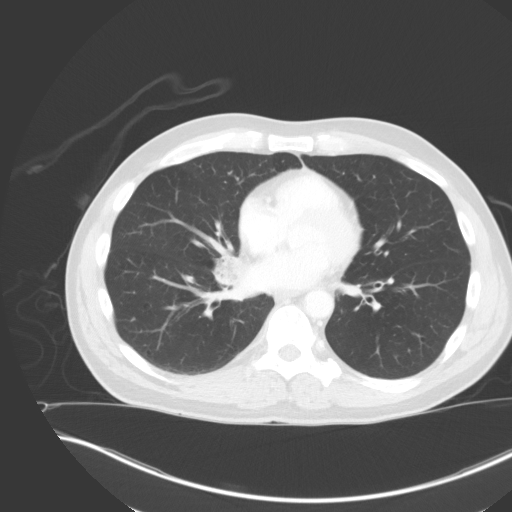
[im 111/139  lung]
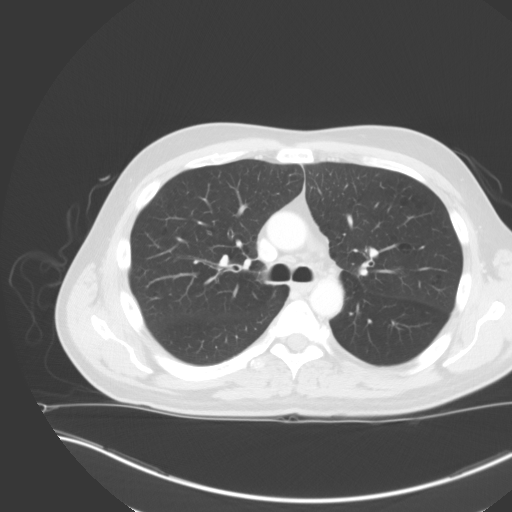
[im 125/139  mediastinal]
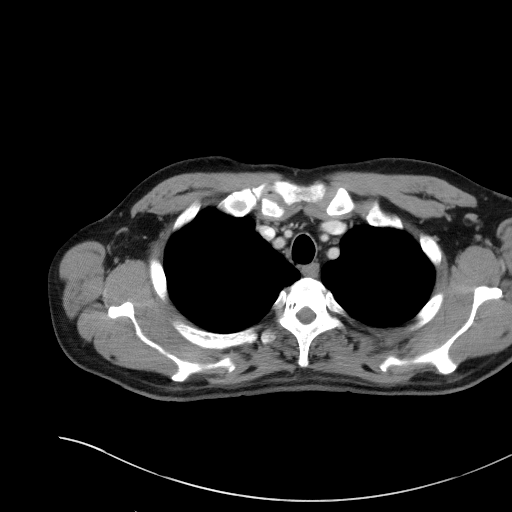
[im 125/139  lung]
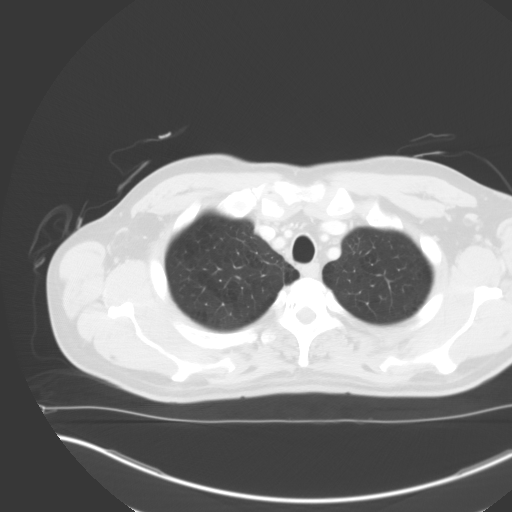

[Series 4: coronals · coronal · 0.85mm/px · 3 of 147 slices shown]
[im 30/147  lung]
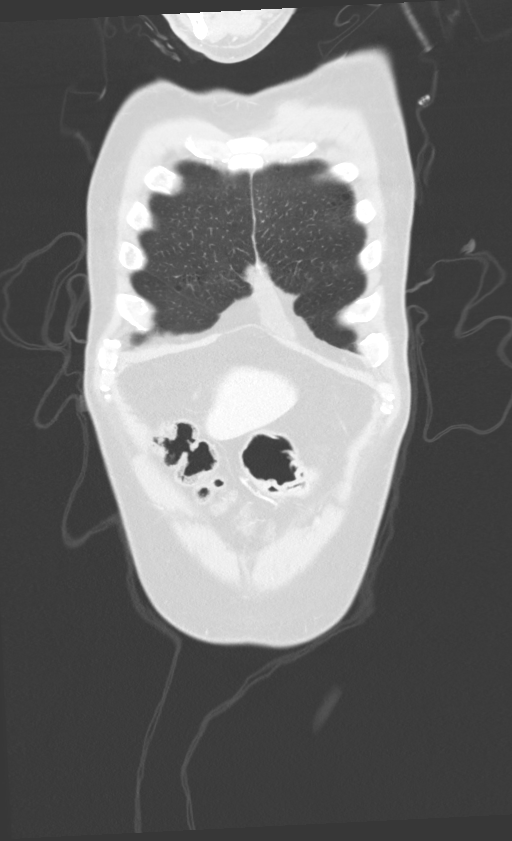
[im 59/147  lung]
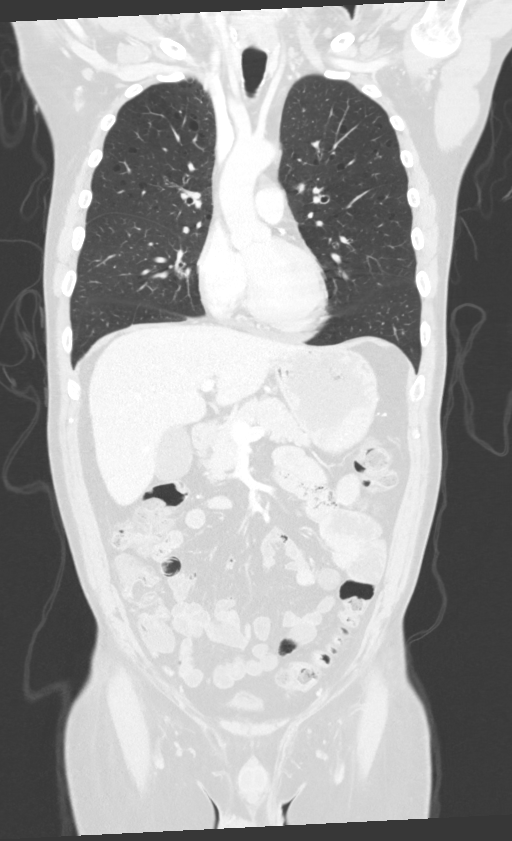
[im 88/147  lung]
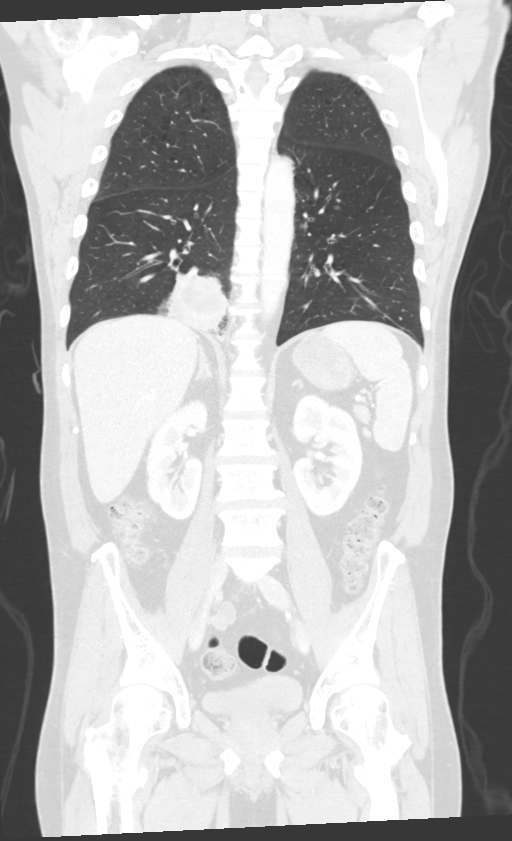

[12 of 36 positions shown; findings below may reference images not displayed]

FINDINGS: CT CHEST FINDINGS

Cardiovascular: Mild atherosclerosis of the aorta, great vessels and
coronary arteries. No evidence of acute pulmonary embolism or other
acute vascular findings. The heart size is normal. There is a small
amount of pericardial fluid versus thickening anteriorly.

Mediastinum/Nodes: There are no enlarged mediastinal, hilar or
axillary lymph nodes. There is a small subcarinal node measuring 9
mm short axis on image 38/2. The thyroid gland, trachea and
esophagus appear normal.

Lungs/Pleura: No pleural effusion or pneumothorax. Mild
centrilobular and paraseptal emphysema. There is a large right lower
lobe infrahilar mass with heterogeneous enhancement highly
suspicious for bronchogenic carcinoma. This measures up to 6.5 x
cm on image 50/2. This mass abuts the IVC, although does not invade
it. The mass also abuts the right hemidiaphragm medially. There is
anterior tenting of the right major fissure inferiorly without
definite invasion of the right middle lobe. No other suspicious
pulmonary nodules.

Musculoskeletal/Chest wall: No chest wall mass or suspicious osseous
findings. A hemangiomas noted in the right aspect of the T3
vertebral body.

CT ABDOMEN AND PELVIS FINDINGS

Hepatobiliary: 1.7 x 1.2 cm low-density lesion in the left hepatic
lobe on image 64/2 is unchanged from the [OF] CT, consistent with a
benign finding. No new or enlarging hepatic lesions. No evidence of
gallstones, gallbladder wall thickening or biliary dilatation.

Pancreas: Unremarkable. No pancreatic ductal dilatation or
surrounding inflammatory changes.

Spleen: Normal in size without focal abnormality.

Adrenals/Urinary Tract: Both adrenal glands appear normal. The
kidneys appear normal without evidence of urinary tract calculus,
suspicious lesion or hydronephrosis. No bladder abnormalities are
seen.

Stomach/Bowel: No evidence of bowel wall thickening, distention or
surrounding inflammatory change. Mild gastric wall thickening
appears similar to the previous study. The appendix appears normal.

Vascular/Lymphatic: There are no enlarged abdominal or pelvic lymph
nodes. Aortic and branch vessel atherosclerosis. Retroaortic left
renal vein.

Reproductive: The prostate gland and seminal vesicles appear normal.

Other: Intact abdominal wall.  No ascites or peritoneal nodularity.

Musculoskeletal: No acute or significant osseous findings.
IMPRESSION: 1. Large right lower lobe infrahilar lung mass highly suspicious for
bronchogenic carcinoma. This mass tents the right major fissure
inferiorly in protrudes into the right middle lobe.
2. No definite metastatic disease within the chest, abdomen or
pelvis.
3. Stable chronic left hepatic lobe lesion, consistent with a benign
finding.
4. Aortic Atherosclerosis ([OF]-[OF]) and Emphysema ([OF]-[OF]).

## 2019-10-18 IMAGING — CT CT CHEST W/ CM
2 of 5 series · 12 of 36 positions shown, 15 images · IV contrast (Omnipaque or Isovue)
Comparison: MR head [DATE]. Chest radiographs [DATE] and
abdominopelvic CT [DATE].

CLINICAL DATA: Intermittent dizziness with headaches for 2 weeks.
Rim enhancing left cerebellar mass. Evaluate for metastatic disease.

EXAM:
CT CHEST, ABDOMEN, AND PELVIS WITH CONTRAST
TECHNIQUE: Multidetector CT imaging of the chest, abdomen and pelvis was
performed following the standard protocol during bolus
administration of intravenous contrast.
CONTRAST:  100mL OMNIPAQUE IOHEXOL 300 MG/ML  SOLN

[Series 2: cap with · axial · 0.81mm/px · z∈[+1138,+1693]mm · 9 of 139 slices shown, 12 images]
[im 14/139  mediastinal]
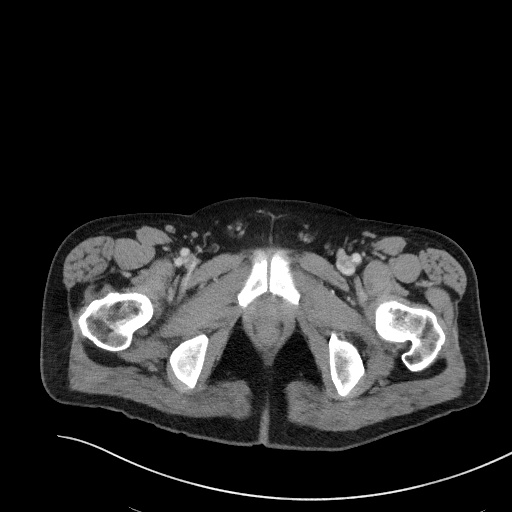
[im 14/139  lung]
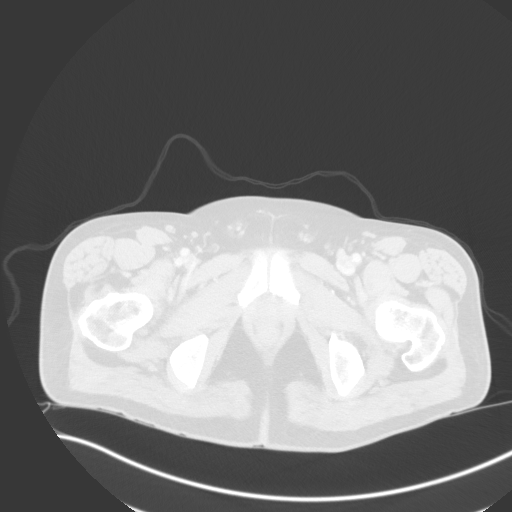
[im 28/139  lung]
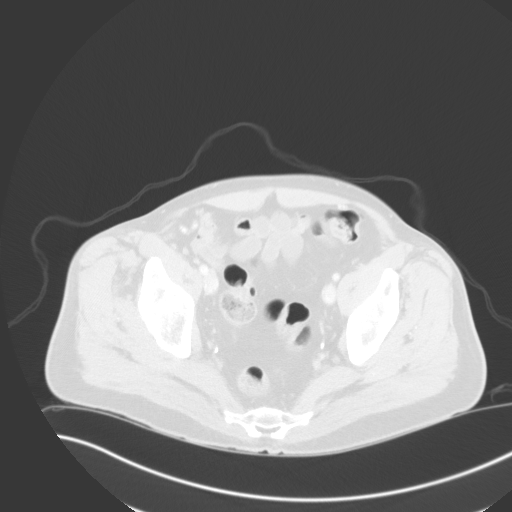
[im 42/139  lung]
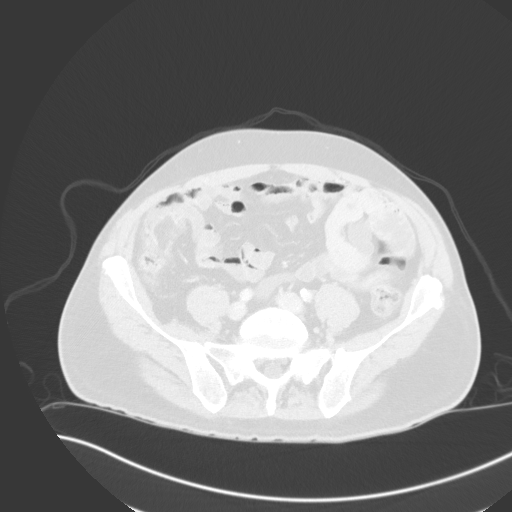
[im 56/139  lung]
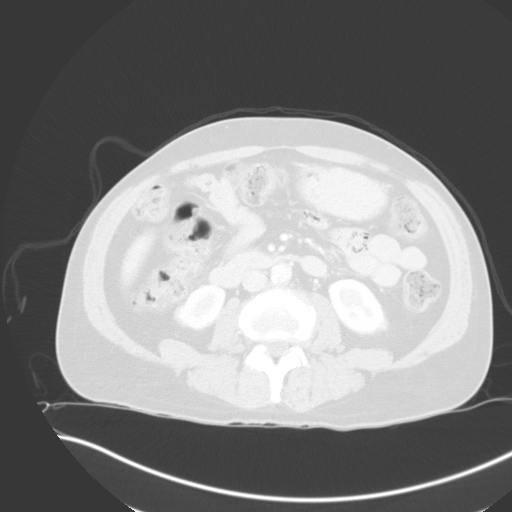
[im 70/139  mediastinal]
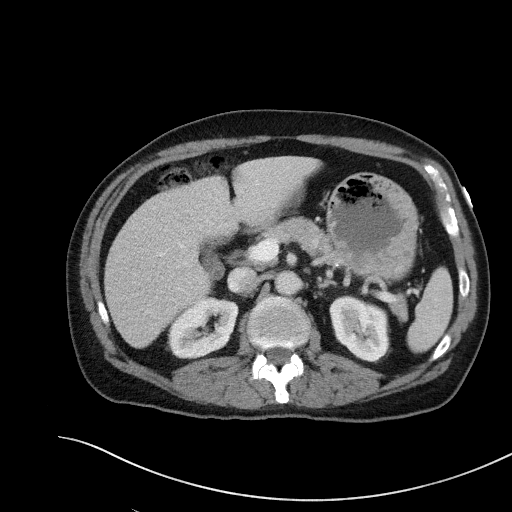
[im 70/139  lung]
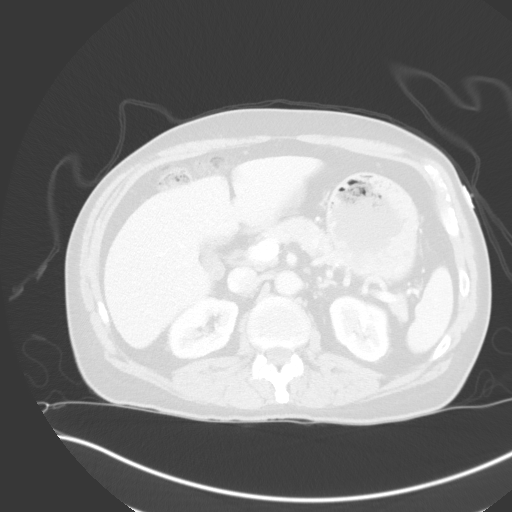
[im 83/139  lung]
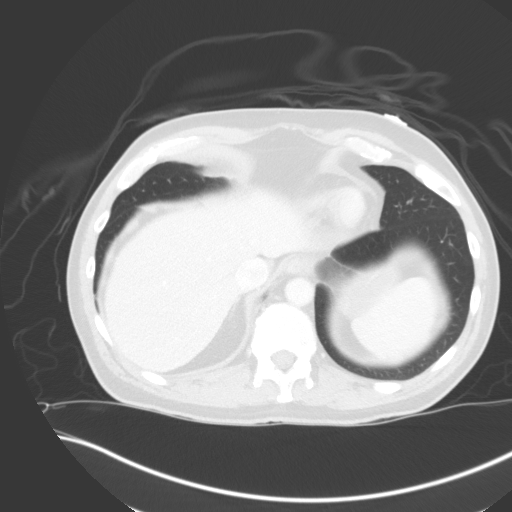
[im 97/139  lung]
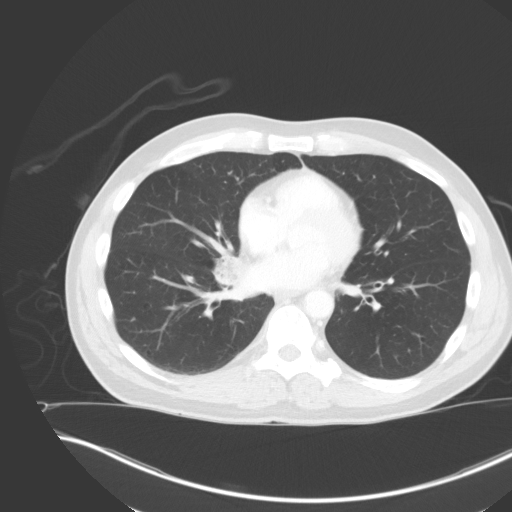
[im 111/139  lung]
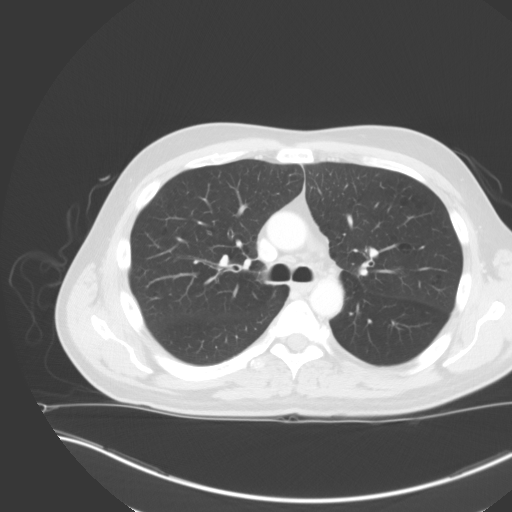
[im 125/139  mediastinal]
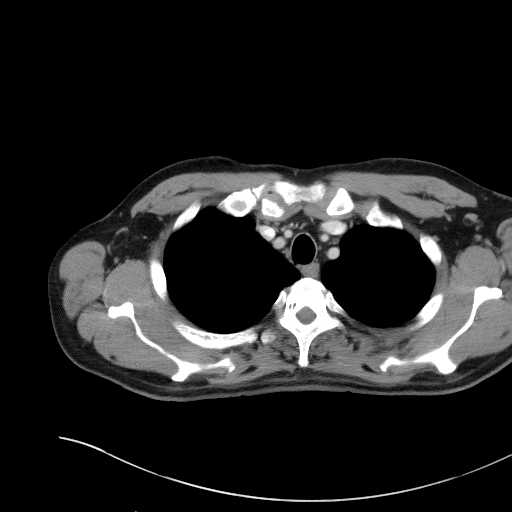
[im 125/139  lung]
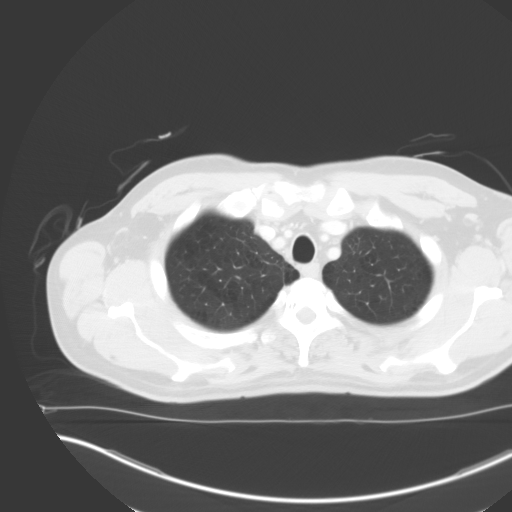

[Series 4: coronals · coronal · 0.85mm/px · 3 of 147 slices shown]
[im 30/147  lung]
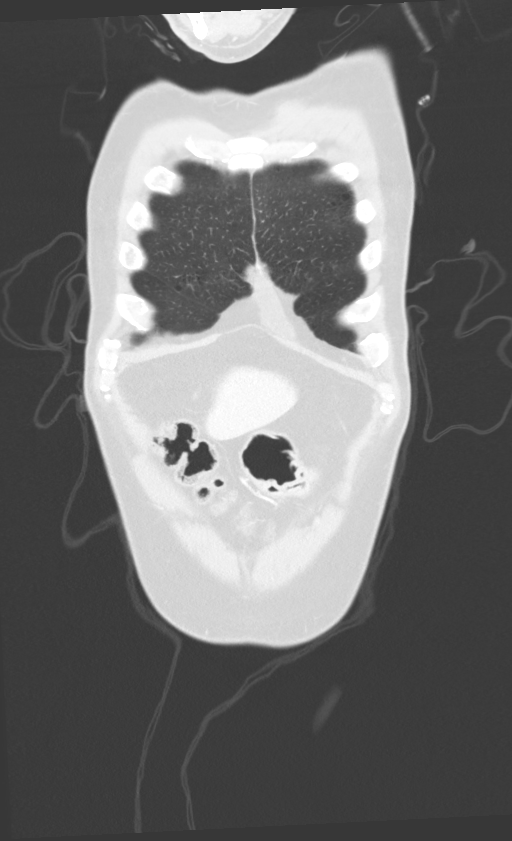
[im 59/147  lung]
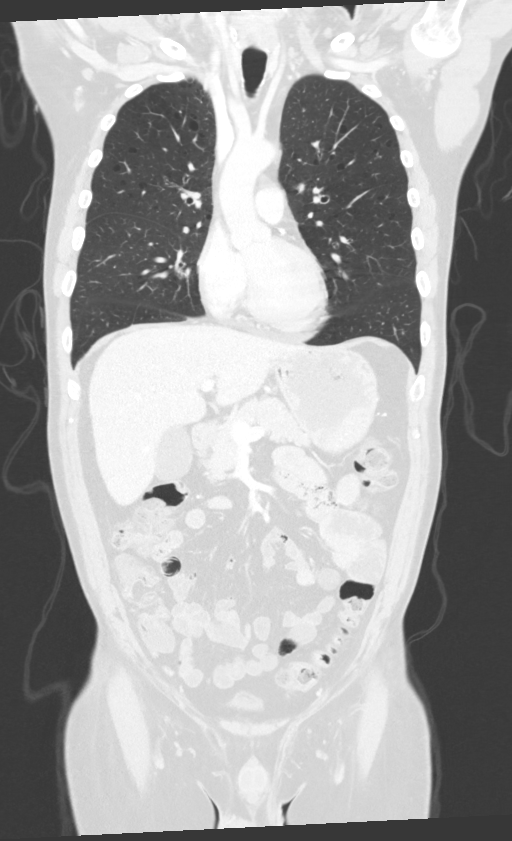
[im 88/147  lung]
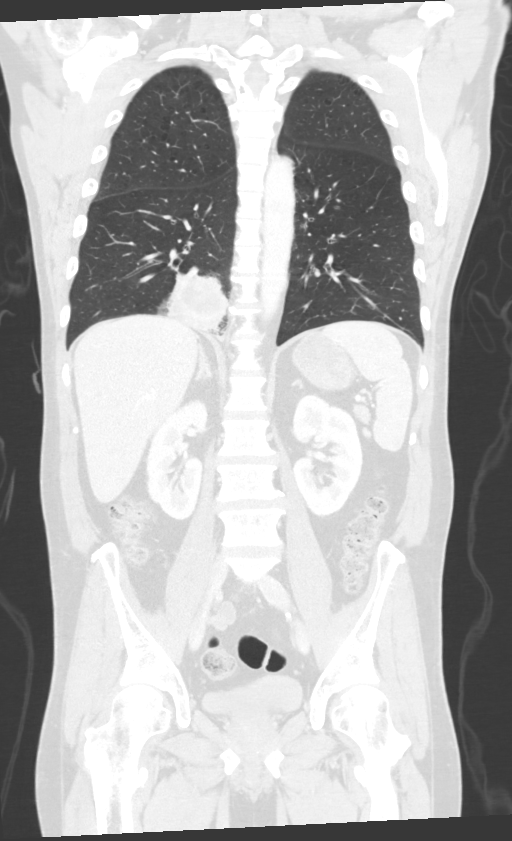

[12 of 36 positions shown; findings below may reference images not displayed]

FINDINGS: CT CHEST FINDINGS

Cardiovascular: Mild atherosclerosis of the aorta, great vessels and
coronary arteries. No evidence of acute pulmonary embolism or other
acute vascular findings. The heart size is normal. There is a small
amount of pericardial fluid versus thickening anteriorly.

Mediastinum/Nodes: There are no enlarged mediastinal, hilar or
axillary lymph nodes. There is a small subcarinal node measuring 9
mm short axis on image 38/2. The thyroid gland, trachea and
esophagus appear normal.

Lungs/Pleura: No pleural effusion or pneumothorax. Mild
centrilobular and paraseptal emphysema. There is a large right lower
lobe infrahilar mass with heterogeneous enhancement highly
suspicious for bronchogenic carcinoma. This measures up to 6.5 x
cm on image 50/2. This mass abuts the IVC, although does not invade
it. The mass also abuts the right hemidiaphragm medially. There is
anterior tenting of the right major fissure inferiorly without
definite invasion of the right middle lobe. No other suspicious
pulmonary nodules.

Musculoskeletal/Chest wall: No chest wall mass or suspicious osseous
findings. A hemangiomas noted in the right aspect of the T3
vertebral body.

CT ABDOMEN AND PELVIS FINDINGS

Hepatobiliary: 1.7 x 1.2 cm low-density lesion in the left hepatic
lobe on image 64/2 is unchanged from the [OF] CT, consistent with a
benign finding. No new or enlarging hepatic lesions. No evidence of
gallstones, gallbladder wall thickening or biliary dilatation.

Pancreas: Unremarkable. No pancreatic ductal dilatation or
surrounding inflammatory changes.

Spleen: Normal in size without focal abnormality.

Adrenals/Urinary Tract: Both adrenal glands appear normal. The
kidneys appear normal without evidence of urinary tract calculus,
suspicious lesion or hydronephrosis. No bladder abnormalities are
seen.

Stomach/Bowel: No evidence of bowel wall thickening, distention or
surrounding inflammatory change. Mild gastric wall thickening
appears similar to the previous study. The appendix appears normal.

Vascular/Lymphatic: There are no enlarged abdominal or pelvic lymph
nodes. Aortic and branch vessel atherosclerosis. Retroaortic left
renal vein.

Reproductive: The prostate gland and seminal vesicles appear normal.

Other: Intact abdominal wall.  No ascites or peritoneal nodularity.

Musculoskeletal: No acute or significant osseous findings.
IMPRESSION: 1. Large right lower lobe infrahilar lung mass highly suspicious for
bronchogenic carcinoma. This mass tents the right major fissure
inferiorly in protrudes into the right middle lobe.
2. No definite metastatic disease within the chest, abdomen or
pelvis.
3. Stable chronic left hepatic lobe lesion, consistent with a benign
finding.
4. Aortic Atherosclerosis ([OF]-[OF]) and Emphysema ([OF]-[OF]).

## 2019-10-18 IMAGING — CT CT CERVICAL SPINE W/O CM
3 of 4 series · 13 of 33 positions shown, 16 images · non-contrast
Comparison: None.

CLINICAL DATA: Acute headache.  Dizziness.  Normal neuro exam

EXAM:
CT HEAD WITHOUT CONTRAST
CT CERVICAL SPINE WITHOUT CONTRAST
TECHNIQUE: Multidetector CT imaging of the head and cervical spine was
performed following the standard protocol without intravenous
contrast. Multiplanar CT image reconstructions of the cervical spine
were also generated.

[Series 5: sagittal bone · sagittal · 0.29mm/px · 5 of 61 slices shown, 6 images]
[im 21/61  bone]
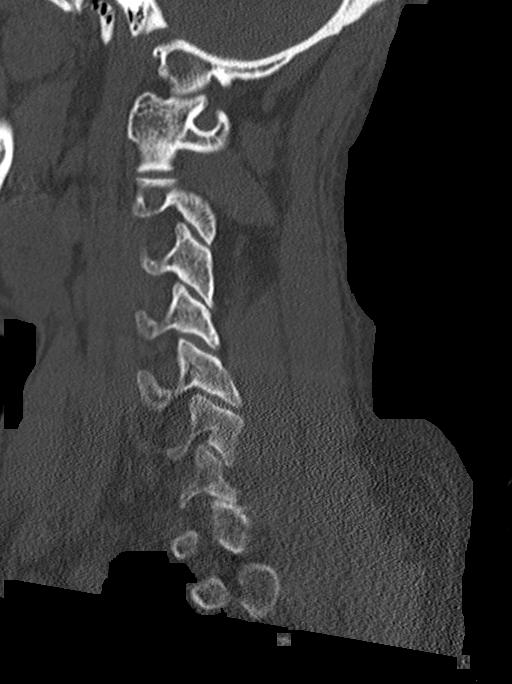
[im 26/61  bone]
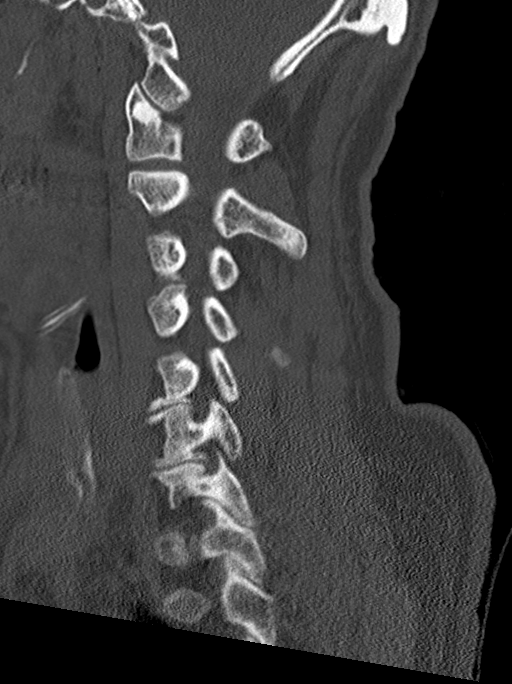
[im 31/61  soft-tissue]
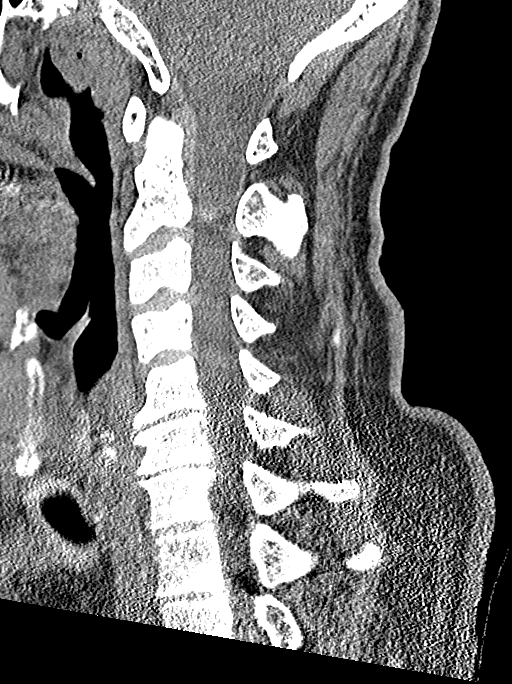
[im 31/61  bone]
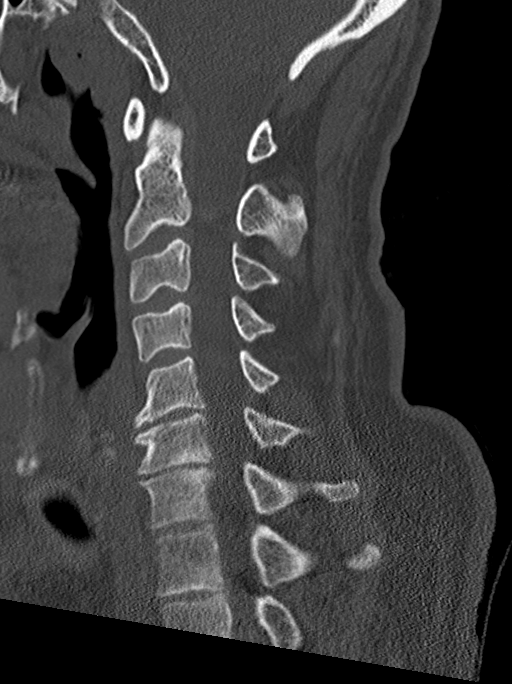
[im 36/61  bone]
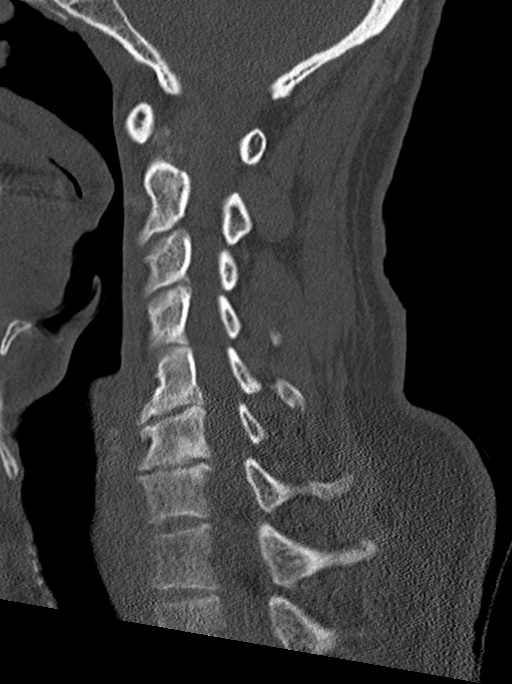
[im 41/61  bone]
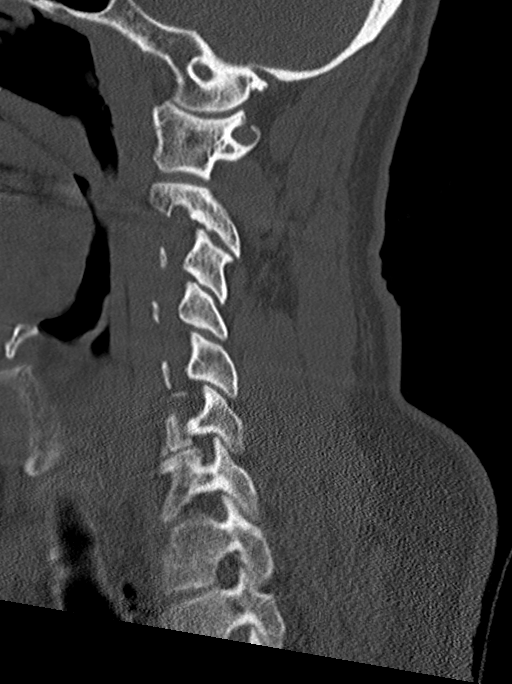

[Series 6: coronal bone · coronal · 0.29mm/px · 3 of 61 slices shown]
[im 13/61  bone]
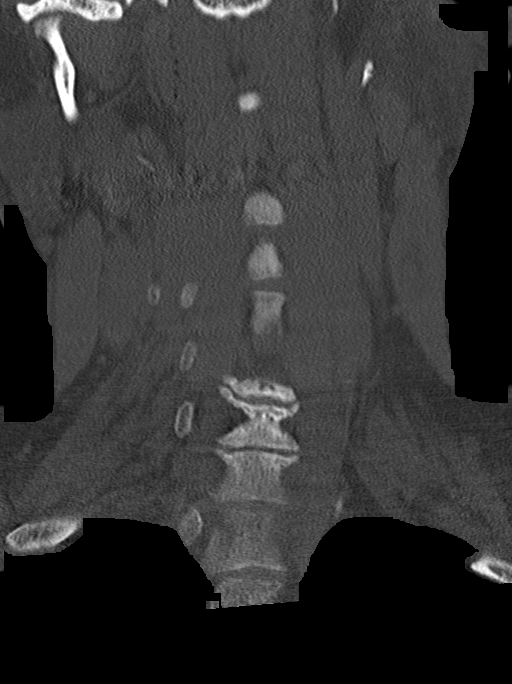
[im 25/61  bone]
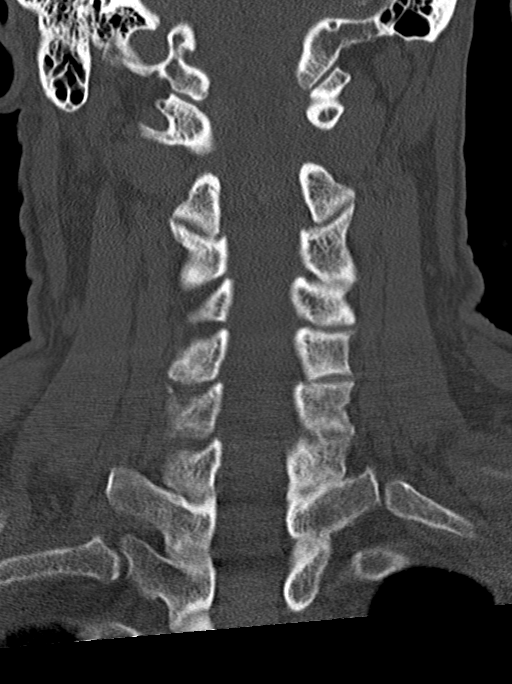
[im 37/61  bone]
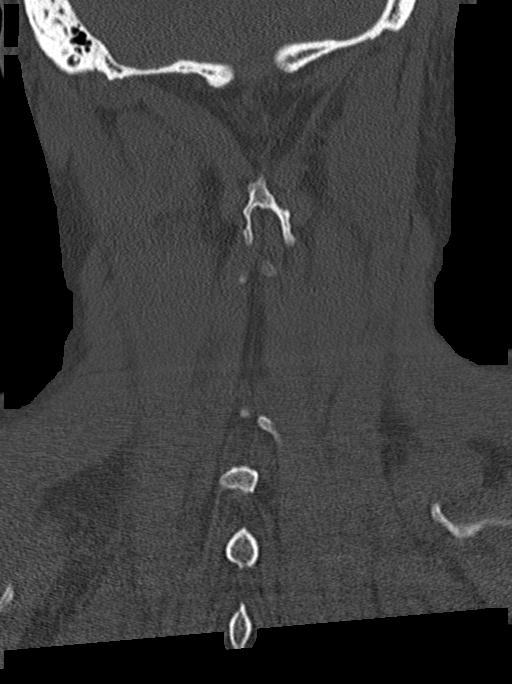

[Series 7: orthogonal axials · axial · 0.21mm/px · z∈[-299,-206]mm · 5 of 88 slices shown, 7 images]
[im 15/88  soft-tissue]
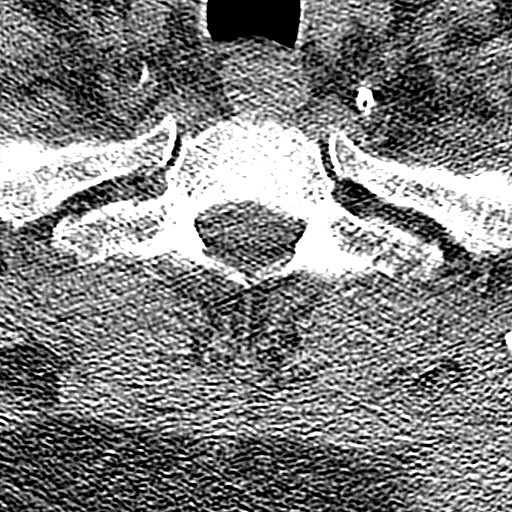
[im 15/88  bone]
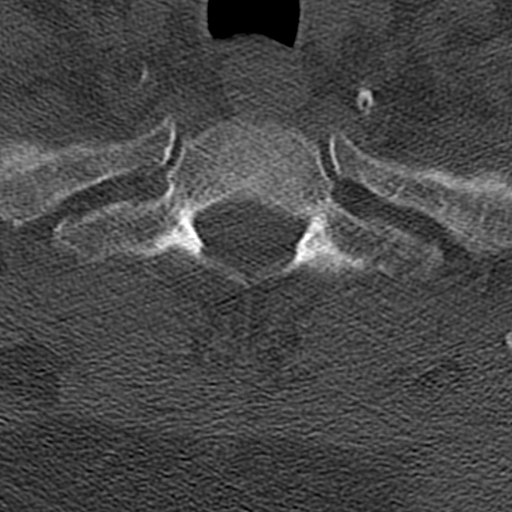
[im 30/88  bone]
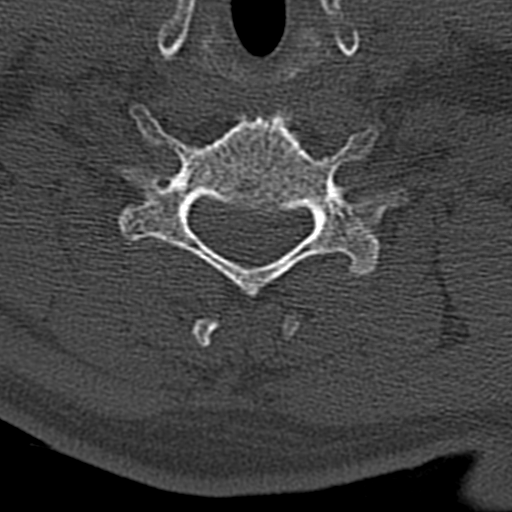
[im 44/88  bone]
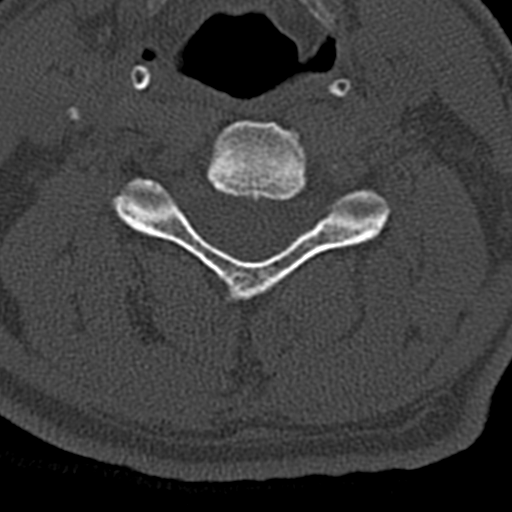
[im 59/88  bone]
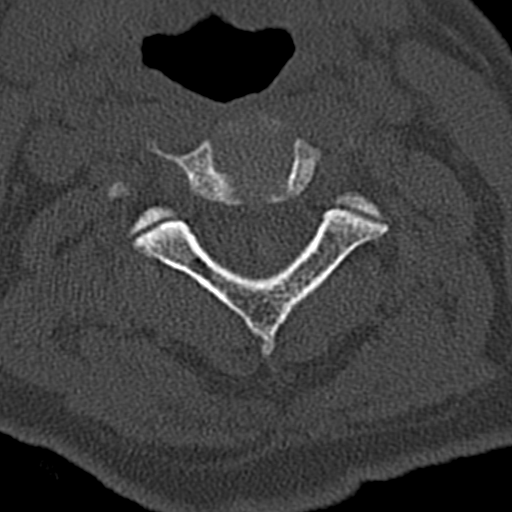
[im 73/88  soft-tissue]
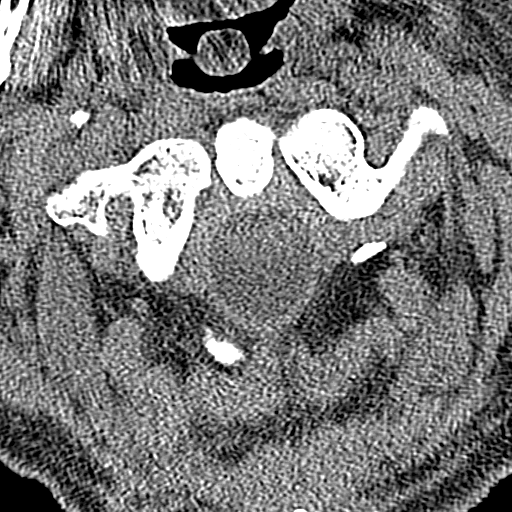
[im 73/88  bone]
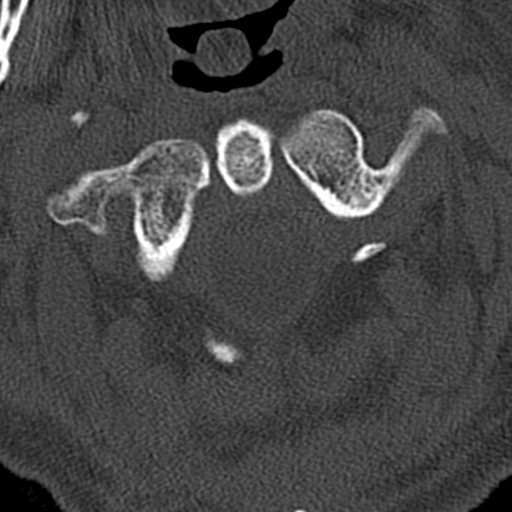

[13 of 33 positions shown; findings below may reference images not displayed]

FINDINGS: CT HEAD FINDINGS

Brain: Large area of low-density in the left cerebellum measuring 3
cm. The low-density is central with sparing of the folia. There is
mass-effect on the fourth ventricle. No obstructive hydrocephalus.

Ventricle size normal. Both cerebral hemispheres normal. No acute
hemorrhage.

Vascular: Negative for hyperdense vessel

Skull: Negative

Sinuses/Orbits: Mucosal edema right maxillary sinus otherwise clear
sinuses. Negative orbit.

Other: None

CT CERVICAL SPINE FINDINGS

Alignment: Normal alignment with straightening of the cervical
lordosis.

Skull base and vertebrae: Negative for fracture or mass

Soft tissues and spinal canal: No soft tissue mass or swelling.

Disc levels: Mild disc degeneration and spurring at C3-4 and C4-5.
Moderate disc degeneration and spurring at C5-6 with mild left
foraminal stenosis. Moderate disc degeneration and spurring C6-7
with moderate foraminal stenosis bilaterally left greater than right
and mild spinal stenosis.

Upper chest: Lung apices clear bilaterally.

Other: None
IMPRESSION: 1. Large low-density lesion in the left cerebellum measuring
approximately 3 cm. Favor tumor. Infarct considered less likely. No
obstructive hydrocephalus. Recommend MRI brain without with contrast
2. Cervical spondylosis as above.
3. These results were called by telephone at the time of
interpretation on [DATE] at [DATE] to provider FIHLA ,
who verbally acknowledged these results.

## 2019-10-18 IMAGING — CT CT HEAD W/O CM
3 series · 15 of 46 positions shown, 18 images · non-contrast
Comparison: None.

CLINICAL DATA: Acute headache.  Dizziness.  Normal neuro exam

EXAM:
CT HEAD WITHOUT CONTRAST
CT CERVICAL SPINE WITHOUT CONTRAST
TECHNIQUE: Multidetector CT imaging of the head and cervical spine was
performed following the standard protocol without intravenous
contrast. Multiplanar CT image reconstructions of the cervical spine
were also generated.

[Series 2: head w o · axial · 0.47mm/px · z∈[-134,-14]mm · 9 of 29 slices shown, 12 images]
[im 3/29  brain]
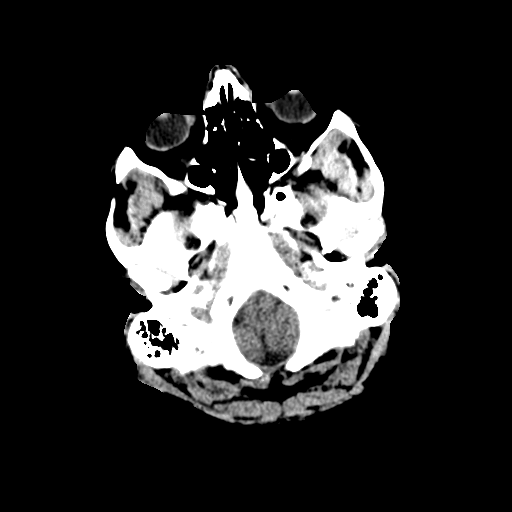
[im 3/29  bone]
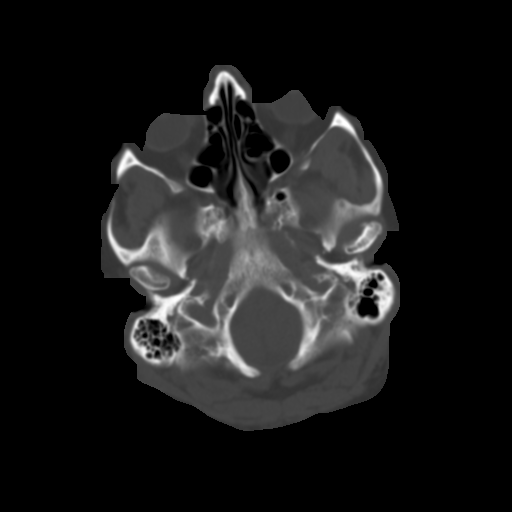
[im 6/29  brain]
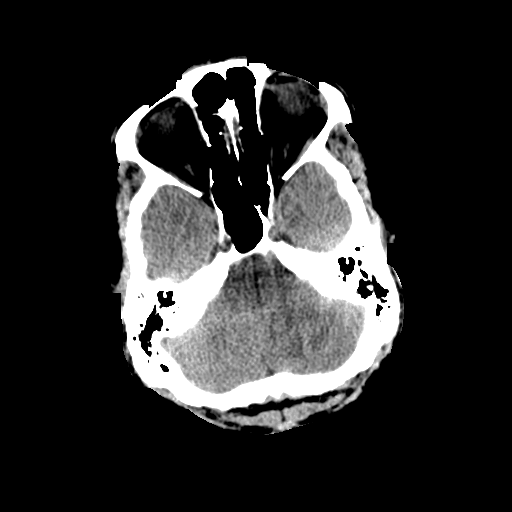
[im 9/29  brain]
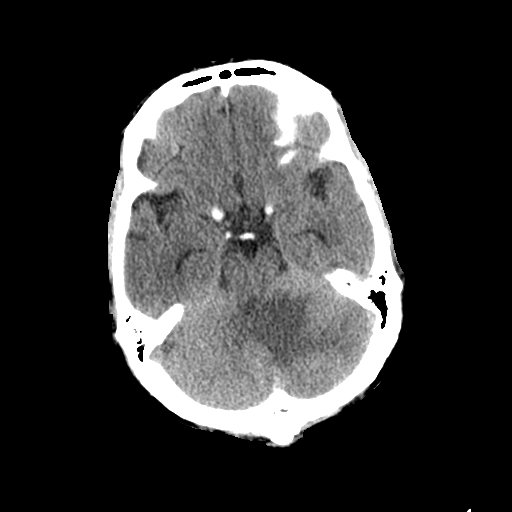
[im 12/29  brain]
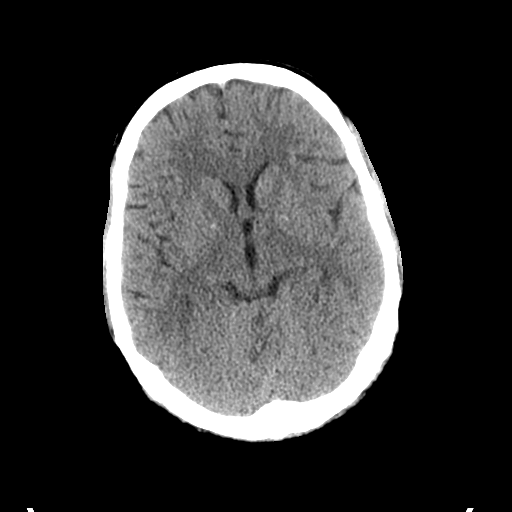
[im 15/29  brain]
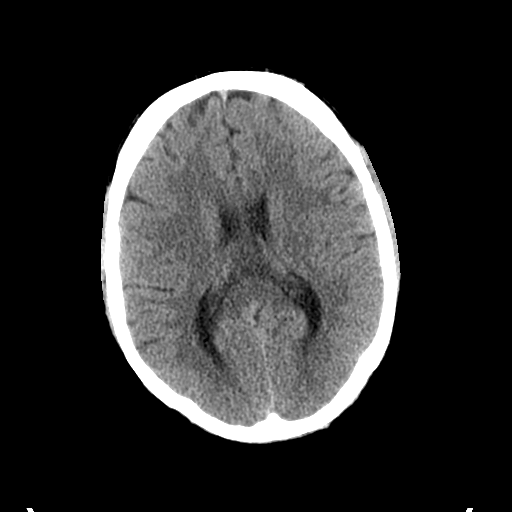
[im 15/29  bone]
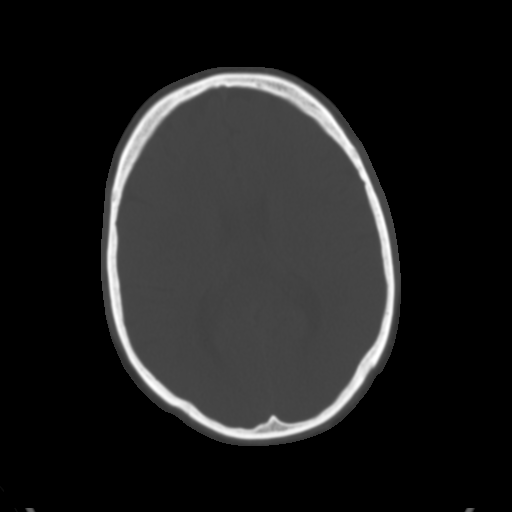
[im 18/29  brain]
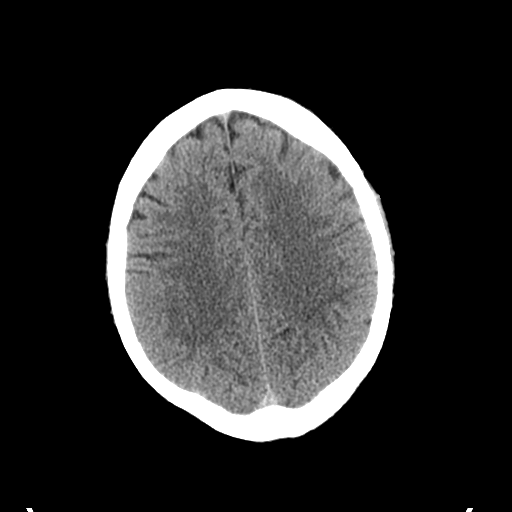
[im 21/29  brain]
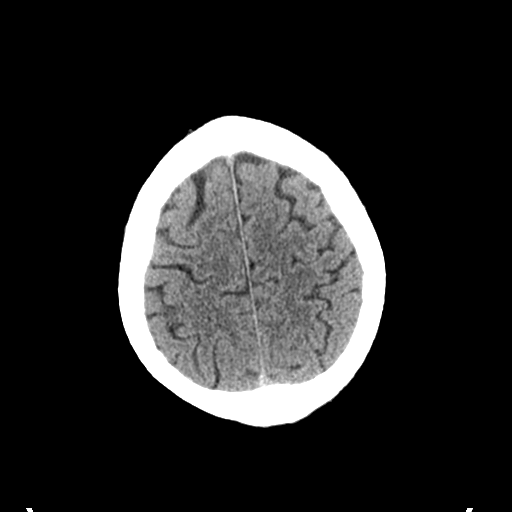
[im 24/29  brain]
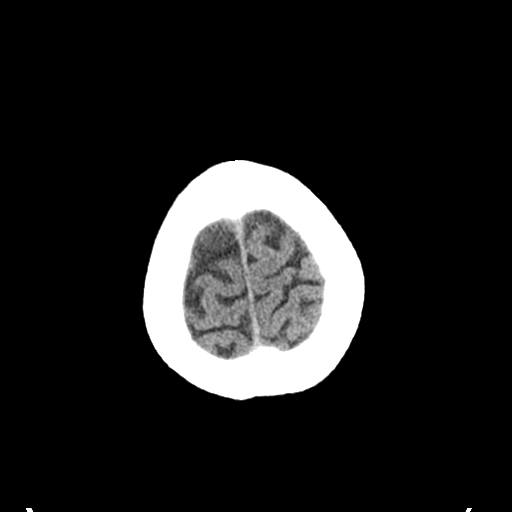
[im 27/29  brain]
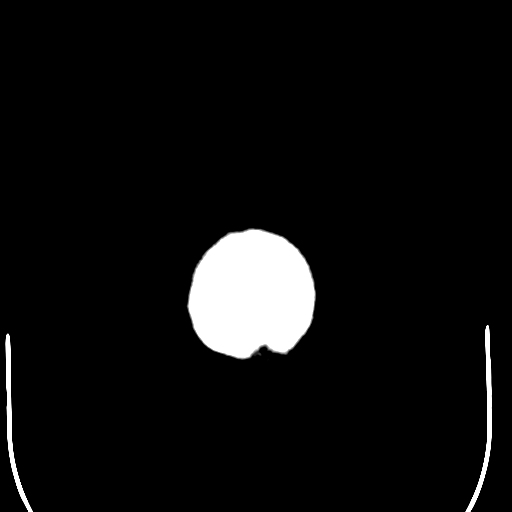
[im 27/29  bone]
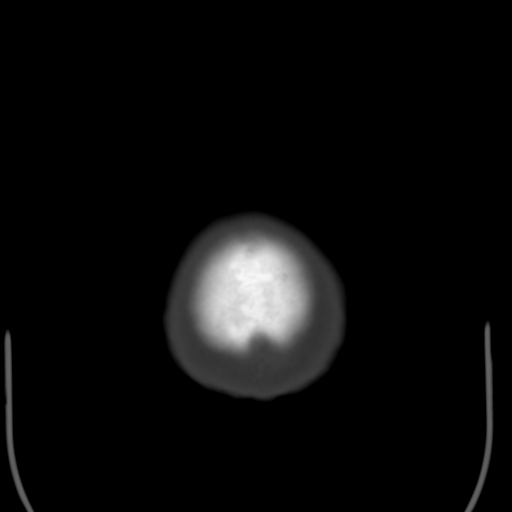

[Series 4: coronal soft · coronal · 0.32mm/px · 3 of 68 slices shown]
[im 23/68  brain]
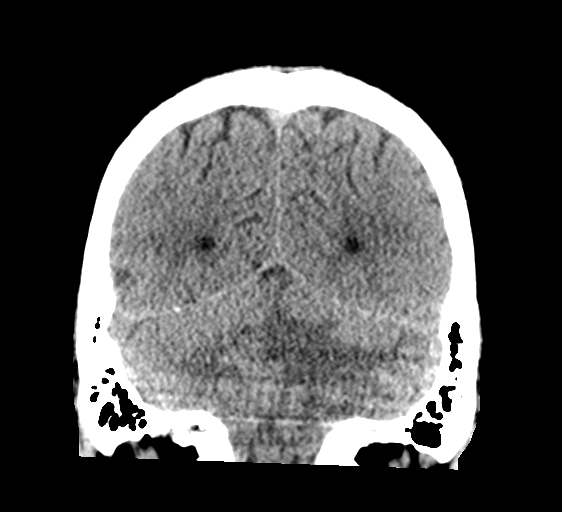
[im 30/68  brain]
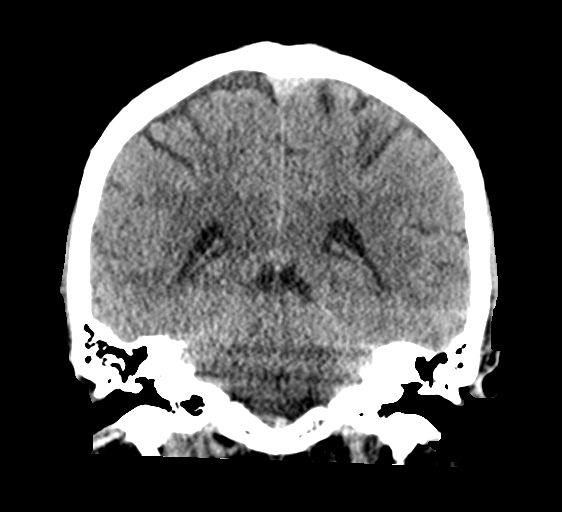
[im 38/68  brain]
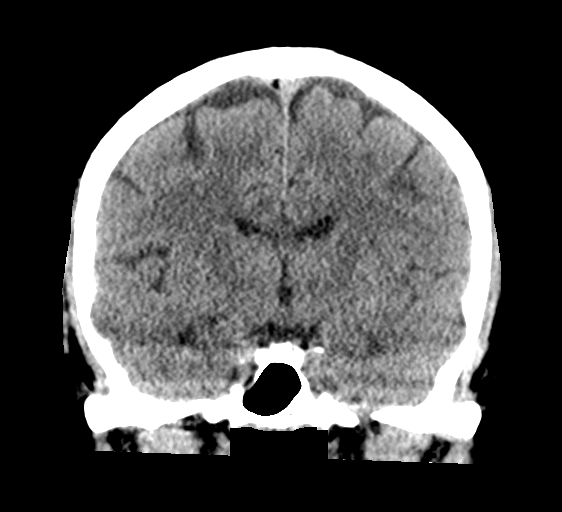

[Series 5: sagittal soft · sagittal · 0.33mm/px · 3 of 57 slices shown]
[im 19/57  brain]
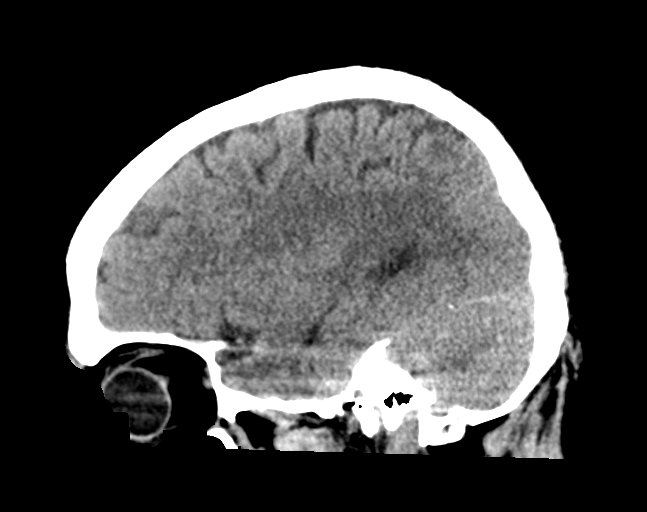
[im 29/57  brain]
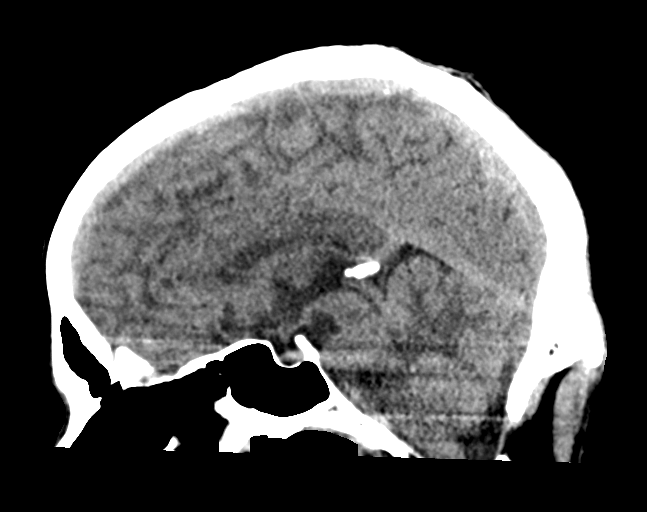
[im 38/57  brain]
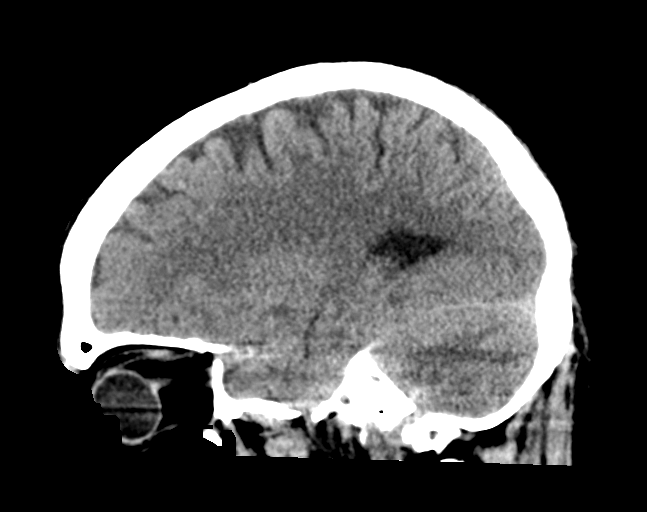

[15 of 46 positions shown; findings below may reference images not displayed]

FINDINGS: CT HEAD FINDINGS

Brain: Large area of low-density in the left cerebellum measuring 3
cm. The low-density is central with sparing of the folia. There is
mass-effect on the fourth ventricle. No obstructive hydrocephalus.

Ventricle size normal. Both cerebral hemispheres normal. No acute
hemorrhage.

Vascular: Negative for hyperdense vessel

Skull: Negative

Sinuses/Orbits: Mucosal edema right maxillary sinus otherwise clear
sinuses. Negative orbit.

Other: None

CT CERVICAL SPINE FINDINGS

Alignment: Normal alignment with straightening of the cervical
lordosis.

Skull base and vertebrae: Negative for fracture or mass

Soft tissues and spinal canal: No soft tissue mass or swelling.

Disc levels: Mild disc degeneration and spurring at C3-4 and C4-5.
Moderate disc degeneration and spurring at C5-6 with mild left
foraminal stenosis. Moderate disc degeneration and spurring C6-7
with moderate foraminal stenosis bilaterally left greater than right
and mild spinal stenosis.

Upper chest: Lung apices clear bilaterally.

Other: None
IMPRESSION: 1. Large low-density lesion in the left cerebellum measuring
approximately 3 cm. Favor tumor. Infarct considered less likely. No
obstructive hydrocephalus. Recommend MRI brain without with contrast
2. Cervical spondylosis as above.
3. These results were called by telephone at the time of
interpretation on [DATE] at [DATE] to provider FIHLA ,
who verbally acknowledged these results.

## 2019-10-18 MED ORDER — GADOBUTROL 1 MMOL/ML IV SOLN
9.0000 mL | Freq: Once | INTRAVENOUS | Status: AC | PRN
  Administered 2019-10-18: 9 mL via INTRAVENOUS

## 2019-10-18 MED ORDER — IOHEXOL 300 MG/ML  SOLN
100.0000 mL | Freq: Once | INTRAMUSCULAR | Status: AC | PRN
  Administered 2019-10-18: 100 mL via INTRAVENOUS

## 2019-10-18 MED ORDER — DEXAMETHASONE 4 MG PO TABS
4.0000 mg | ORAL_TABLET | Freq: Once | ORAL | Status: AC
  Administered 2019-10-18: 4 mg via ORAL
  Filled 2019-10-18: qty 1

## 2019-10-18 MED ORDER — DEXAMETHASONE 4 MG PO TABS
4.0000 mg | ORAL_TABLET | Freq: Two times a day (BID) | ORAL | 0 refills | Status: DC
Start: 2019-10-18 — End: 2019-11-14

## 2019-10-18 NOTE — ED Triage Notes (Signed)
Patient comes to the ED with intermittent dizziness and headache for 2 weeks.  Patient feels has worsened since waking this morning.  Patient had been seen at the urgent care in [facility] for the same thing on one week ago.

## 2019-10-18 NOTE — Discharge Instructions (Signed)
It appears that you have lung cancer on the right side, which has led to a metastatic lesion in your left cerebellum/brain.  The latter is what is causing your headache, and dizzy feeling.  We are prescribing dexamethasone to help your headache by decreasing edema.  Make sure you follow-up with Dr. Ellene Route, the neurosurgeon, on Monday as scheduled.

## 2019-10-18 NOTE — ED Provider Notes (Signed)
[facility] Provider Note   CSN: [id number] Arrival date & time: 10/18/19  0813     History Chief Complaint  Patient presents with  . Dizziness    Joseph Atkins is a 59 y.o. male.  HPI Patient here for evaluation of headache with dizziness for several weeks.  Headache was worse today so he came in.  He was evaluated in urgent care for this problem, last week.  He was treated with prednisone and Flexeril, taking these medicines without relief.  He is having trouble working because of the discomfort.  He denies fever, chills, nausea, vomiting, blurred vision, double vision, gait disorder, paresthesia or focal weakness.    Past Medical History:  Diagnosis Date  . Diverticulosis   . GERD (gastroesophageal reflux disease)   . Hx of small bowel obstruction   . Hyperlipidemia   . IBS (irritable bowel syndrome)   . Substance abuse (HCC)    Alcoholic, Drug addition  . Thyroid disease     Patient Active Problem List   Diagnosis Date Noted  . Acute bronchitis 01/31/2017  . Essential hypertension 08/28/2014  . Vitamin D deficiency 07/22/2014  . Hyperlipidemia 07/15/2014  . GERD (gastroesophageal reflux disease) 07/15/2014  . Hypothyroidism 07/15/2014  . Smoker 07/15/2014  . Constipation 03/07/2013    Past Surgical History:  Procedure Laterality Date  . arm surgery Right   . FINGER SURGERY Right    Middle       Family History  Problem Relation Age of Onset  . Epilepsy Mother   . Heart disease Mother   . Heart disease Brother   . Lung cancer Paternal Uncle     Social History   Tobacco Use  . Smoking status: Current Every Day Smoker    Packs/day: 0.10    Types: Cigarettes  . Smokeless tobacco: Former Systems developer    Types: Secondary school teacher  . Vaping Use: Never used  Substance Use Topics  . Alcohol use: No    Comment: Alcoholic clean for 6 years  . Drug use: No    Comment: Recovering Drug Addict-clean for 6 years    Home  Medications Prior to Admission medications   Medication Sig Start Date End Date Taking? Authorizing Provider  albuterol (PROVENTIL HFA;VENTOLIN HFA) 108 (90 Base) MCG/ACT inhaler Inhale 2 puffs into the lungs every 6 (six) hours as needed for wheezing or shortness of breath. 02/07/17  Yes Dettinger, Fransisca Kaufmann, MD  cyclobenzaprine (FLEXERIL) 10 MG tablet Take 10 mg by mouth 2 (two) times daily as needed for muscle spasms. 10/15/19  Yes [provider]  docusate sodium (COLACE) 100 MG capsule Take 100 mg by mouth daily.   Yes [provider]  fluticasone furoate-vilanterol (BREO ELLIPTA) 100-25 MCG/INH AEPB Inhale 1 puff into the lungs daily. 05/25/17  Yes Dettinger, Fransisca Kaufmann, MD  ibuprofen (ADVIL) 800 MG tablet Take 800 mg by mouth 3 (three) times daily. 10/14/19  Yes [provider]  levothyroxine (SYNTHROID) 75 MCG tablet Take 75 mcg by mouth daily. 07/27/19  Yes [provider]  omeprazole (PRILOSEC) 40 MG capsule Take 1 capsule (40 mg total) by mouth daily. 02/07/17  Yes Dettinger, Fransisca Kaufmann, MD  ondansetron (ZOFRAN) 4 MG tablet Take 4 mg by mouth every 8 (eight) hours as needed. 10/15/19  Yes [provider]  TRULANCE 3 MG TABS Take 1 tablet by mouth daily. 09/06/19  Yes [provider]  dexamethasone (DECADRON) 4 MG tablet Take 1 tablet (4 mg  total) by mouth 2 (two) times daily with a meal. 10/18/19   Daleen Bo, MD  polyethylene glycol powder (MIRALAX) powder Take 17 g by mouth daily. 05/09/14   Lysbeth Penner, FNP    Allergies    Penicillins  Review of Systems   Review of Systems  All other systems reviewed and are negative.   Physical Exam Updated Vital Signs BP 127/69   Pulse 69   Temp 97.9 F (36.6 C) (Oral)   Resp 18   Ht 5\' 8"  (1.727 m)   Wt 83.5 kg   SpO2 99%   BMI 27.98 kg/m   Physical Exam Vitals and nursing note reviewed.  Constitutional:      General: He is not in acute distress.    Appearance: He is  well-developed. He is not ill-appearing, toxic-appearing or diaphoretic.  HENT:     Head: Normocephalic and atraumatic.     Right Ear: External ear normal.     Left Ear: External ear normal.  Eyes:     Conjunctiva/sclera: Conjunctivae normal.     Pupils: Pupils are equal, round, and reactive to light.  Neck:     Trachea: Phonation normal.  Cardiovascular:     Rate and Rhythm: Normal rate.  Pulmonary:     Effort: Pulmonary effort is normal.  Abdominal:     General: There is no distension.  Musculoskeletal:        General: Normal range of motion.     Cervical back: Normal range of motion and neck supple.  Skin:    General: Skin is warm and dry.  Neurological:     Mental Status: He is alert and oriented to person, place, and time.     Cranial Nerves: No cranial nerve deficit.     Sensory: No sensory deficit.     Motor: No abnormal muscle tone.     Coordination: Coordination normal.     Comments: No dysarthria or aphasia.  Normal strength, bilaterally.  Psychiatric:        Mood and Affect: Mood normal.        Behavior: Behavior normal.        Thought Content: Thought content normal.        Judgment: Judgment normal.     ED Results / Procedures / Treatments   Labs (all labs ordered are listed, but only abnormal results are displayed) Labs Reviewed  COMPREHENSIVE METABOLIC PANEL - Abnormal; Notable for the following components:      Result Value   Sodium 134 (*)    Chloride 95 (*)    Glucose, Bld 109 (*)    Total Protein 8.5 (*)    All other components within normal limits  CBC WITH DIFFERENTIAL/PLATELET - Abnormal; Notable for the following components:   WBC 12.1 (*)    Neutro Abs 9.1 (*)    All other components within normal limits  SARS CORONAVIRUS 2 BY RT PCR (HOSPITAL ORDER, [facility])  CBC WITH DIFFERENTIAL/PLATELET    EKG None  Radiology CT Head Wo Contrast  Result Date: 10/18/2019 CLINICAL DATA:  Acute headache.  Dizziness.   Normal neuro exam EXAM: CT HEAD WITHOUT CONTRAST CT CERVICAL SPINE WITHOUT CONTRAST TECHNIQUE: Multidetector CT imaging of the head and cervical spine was performed following the standard protocol without intravenous contrast. Multiplanar CT image reconstructions of the cervical spine were also generated. COMPARISON:  None. FINDINGS: CT HEAD FINDINGS Brain: Large area of low-density in the left cerebellum measuring 3 cm.  The low-density is central with sparing of the folia. There is mass-effect on the fourth ventricle. No obstructive hydrocephalus. Ventricle size normal. Both cerebral hemispheres normal. No acute hemorrhage. Vascular: Negative for hyperdense vessel Skull: Negative Sinuses/Orbits: Mucosal edema right maxillary sinus otherwise clear sinuses. Negative orbit. Other: None CT CERVICAL SPINE FINDINGS Alignment: Normal alignment with straightening of the cervical lordosis. Skull base and vertebrae: Negative for fracture or mass Soft tissues and spinal canal: No soft tissue mass or swelling. Disc levels: Mild disc degeneration and spurring at C3-4 and C4-5. Moderate disc degeneration and spurring at C5-6 with mild left foraminal stenosis. Moderate disc degeneration and spurring C6-7 with moderate foraminal stenosis bilaterally left greater than right and mild spinal stenosis. Upper chest: Lung apices clear bilaterally. Other: None IMPRESSION: 1. Large low-density lesion in the left cerebellum measuring approximately 3 cm. Favor tumor. Infarct considered less likely. No obstructive hydrocephalus. Recommend MRI brain without with contrast 2. Cervical spondylosis as above. 3. These results were called by telephone at the time of interpretation on 10/18/2019 at 10:23 am to provider [facility] , who verbally acknowledged these results. Electronically Signed   By: Franchot Gallo M.D.   On: 10/18/2019 10:23   CT Chest W Contrast  Result Date: 10/18/2019 CLINICAL DATA:  Intermittent dizziness with headaches  for 2 weeks. Rim enhancing left cerebellar mass. Evaluate for metastatic disease. EXAM: CT CHEST, ABDOMEN, AND PELVIS WITH CONTRAST TECHNIQUE: Multidetector CT imaging of the chest, abdomen and pelvis was performed following the standard protocol during bolus administration of intravenous contrast. CONTRAST:  113mL OMNIPAQUE IOHEXOL 300 MG/ML  SOLN COMPARISON:  MR head 10/18/2019. Chest radiographs 03/10/2017 and abdominopelvic CT 06/15/2016. FINDINGS: CT CHEST FINDINGS Cardiovascular: Mild atherosclerosis of the aorta, great vessels and coronary arteries. No evidence of acute pulmonary embolism or other acute vascular findings. The heart size is normal. There is a small amount of pericardial fluid versus thickening anteriorly. Mediastinum/Nodes: There are no enlarged mediastinal, hilar or axillary lymph nodes. There is a small subcarinal node measuring 9 mm short axis on image 38/2. The thyroid gland, trachea and esophagus appear normal. Lungs/Pleura: No pleural effusion or pneumothorax. Mild centrilobular and paraseptal emphysema. There is a large right lower lobe infrahilar mass with heterogeneous enhancement highly suspicious for bronchogenic carcinoma. This measures up to 6.5 x 5.3 cm on image 50/2. This mass abuts the IVC, although does not invade it. The mass also abuts the right hemidiaphragm medially. There is anterior tenting of the right major fissure inferiorly without definite invasion of the right middle lobe. No other suspicious pulmonary nodules. Musculoskeletal/Chest wall: No chest wall mass or suspicious osseous findings. A hemangiomas noted in the right aspect of the T3 vertebral body. CT ABDOMEN AND PELVIS FINDINGS Hepatobiliary: 1.7 x 1.2 cm low-density lesion in the left hepatic lobe on image 64/2 is unchanged from the 2018 CT, consistent with a benign finding. No new or enlarging hepatic lesions. No evidence of gallstones, gallbladder wall thickening or biliary dilatation. Pancreas:  Unremarkable. No pancreatic ductal dilatation or surrounding inflammatory changes. Spleen: Normal in size without focal abnormality. Adrenals/Urinary Tract: Both adrenal glands appear normal. The kidneys appear normal without evidence of urinary tract calculus, suspicious lesion or hydronephrosis. No bladder abnormalities are seen. Stomach/Bowel: No evidence of bowel wall thickening, distention or surrounding inflammatory change. Mild gastric wall thickening appears similar to the previous study. The appendix appears normal. Vascular/Lymphatic: There are no enlarged abdominal or pelvic lymph nodes. Aortic and branch vessel atherosclerosis. Retroaortic left renal vein.  Reproductive: The prostate gland and seminal vesicles appear normal. Other: Intact abdominal wall.  No ascites or peritoneal nodularity. Musculoskeletal: No acute or significant osseous findings. IMPRESSION: 1. Large right lower lobe infrahilar lung mass highly suspicious for bronchogenic carcinoma. This mass tents the right major fissure inferiorly in protrudes into the right middle lobe. 2. No definite metastatic disease within the chest, abdomen or pelvis. 3. Stable chronic left hepatic lobe lesion, consistent with a benign finding. 4. Aortic Atherosclerosis (ICD10-I70.0) and Emphysema (ICD10-J43.9). Electronically Signed   By: Richardean Sale M.D.   On: 10/18/2019 15:38   CT Cervical Spine Wo Contrast  Result Date: 10/18/2019 CLINICAL DATA:  Acute headache.  Dizziness.  Normal neuro exam EXAM: CT HEAD WITHOUT CONTRAST CT CERVICAL SPINE WITHOUT CONTRAST TECHNIQUE: Multidetector CT imaging of the head and cervical spine was performed following the standard protocol without intravenous contrast. Multiplanar CT image reconstructions of the cervical spine were also generated. COMPARISON:  None. FINDINGS: CT HEAD FINDINGS Brain: Large area of low-density in the left cerebellum measuring 3 cm. The low-density is central with sparing of the folia.  There is mass-effect on the fourth ventricle. No obstructive hydrocephalus. Ventricle size normal. Both cerebral hemispheres normal. No acute hemorrhage. Vascular: Negative for hyperdense vessel Skull: Negative Sinuses/Orbits: Mucosal edema right maxillary sinus otherwise clear sinuses. Negative orbit. Other: None CT CERVICAL SPINE FINDINGS Alignment: Normal alignment with straightening of the cervical lordosis. Skull base and vertebrae: Negative for fracture or mass Soft tissues and spinal canal: No soft tissue mass or swelling. Disc levels: Mild disc degeneration and spurring at C3-4 and C4-5. Moderate disc degeneration and spurring at C5-6 with mild left foraminal stenosis. Moderate disc degeneration and spurring C6-7 with moderate foraminal stenosis bilaterally left greater than right and mild spinal stenosis. Upper chest: Lung apices clear bilaterally. Other: None IMPRESSION: 1. Large low-density lesion in the left cerebellum measuring approximately 3 cm. Favor tumor. Infarct considered less likely. No obstructive hydrocephalus. Recommend MRI brain without with contrast 2. Cervical spondylosis as above. 3. These results were called by telephone at the time of interpretation on 10/18/2019 at 10:23 am to provider [facility] , who verbally acknowledged these results. Electronically Signed   By: Franchot Gallo M.D.   On: 10/18/2019 10:23   MR Brain W and Wo Contrast  Result Date: 10/18/2019 CLINICAL DATA:  Dizziness.  Brain lesion on CT. EXAM: MRI HEAD WITHOUT AND WITH CONTRAST TECHNIQUE: Multiplanar, multiecho pulse sequences of the brain and surrounding structures were obtained without and with intravenous contrast. CONTRAST:  55mL GADAVIST GADOBUTROL 1 MMOL/ML IV SOLN COMPARISON:  CT head 10/18/2019 FINDINGS: Brain: Left cerebellar mass lesion demonstrates rim enhancement and central necrosis. There is a moderate to large amount of surrounding white matter edema with mass-effect on the fourth ventricle. No  obstruction of the ventricles. The wall of the mass does show some mild restricted diffusion but the central necrotic area does not. No other mass lesions identified. Small developmental venous anomaly in the right frontal lobe. Ventricle size is normal. Negative for acute infarct. Mild chronic microvascular ischemic change in the white matter. Vascular: Normal arterial flow voids Skull and upper cervical spine: No focal skeletal lesion. Sinuses/Orbits: Mild mucosal edema paranasal sinuses. Negative orbit. Other: None IMPRESSION: 2 cm rim enhancing mass left cerebellar with surrounding white matter edema. No second lesion identified. This is most consistent with metastatic disease and workup for malignancy is recommended. Brain abscess considered less likely due to lack of central restricted diffusion and location.  Electronically Signed   By: Franchot Gallo M.D.   On: 10/18/2019 12:26   CT Abdomen Pelvis W Contrast  Result Date: 10/18/2019 CLINICAL DATA:  Intermittent dizziness with headaches for 2 weeks. Rim enhancing left cerebellar mass. Evaluate for metastatic disease. EXAM: CT CHEST, ABDOMEN, AND PELVIS WITH CONTRAST TECHNIQUE: Multidetector CT imaging of the chest, abdomen and pelvis was performed following the standard protocol during bolus administration of intravenous contrast. CONTRAST:  112mL OMNIPAQUE IOHEXOL 300 MG/ML  SOLN COMPARISON:  MR head 10/18/2019. Chest radiographs 03/10/2017 and abdominopelvic CT 06/15/2016. FINDINGS: CT CHEST FINDINGS Cardiovascular: Mild atherosclerosis of the aorta, great vessels and coronary arteries. No evidence of acute pulmonary embolism or other acute vascular findings. The heart size is normal. There is a small amount of pericardial fluid versus thickening anteriorly. Mediastinum/Nodes: There are no enlarged mediastinal, hilar or axillary lymph nodes. There is a small subcarinal node measuring 9 mm short axis on image 38/2. The thyroid gland, trachea and esophagus  appear normal. Lungs/Pleura: No pleural effusion or pneumothorax. Mild centrilobular and paraseptal emphysema. There is a large right lower lobe infrahilar mass with heterogeneous enhancement highly suspicious for bronchogenic carcinoma. This measures up to 6.5 x 5.3 cm on image 50/2. This mass abuts the IVC, although does not invade it. The mass also abuts the right hemidiaphragm medially. There is anterior tenting of the right major fissure inferiorly without definite invasion of the right middle lobe. No other suspicious pulmonary nodules. Musculoskeletal/Chest wall: No chest wall mass or suspicious osseous findings. A hemangiomas noted in the right aspect of the T3 vertebral body. CT ABDOMEN AND PELVIS FINDINGS Hepatobiliary: 1.7 x 1.2 cm low-density lesion in the left hepatic lobe on image 64/2 is unchanged from the 2018 CT, consistent with a benign finding. No new or enlarging hepatic lesions. No evidence of gallstones, gallbladder wall thickening or biliary dilatation. Pancreas: Unremarkable. No pancreatic ductal dilatation or surrounding inflammatory changes. Spleen: Normal in size without focal abnormality. Adrenals/Urinary Tract: Both adrenal glands appear normal. The kidneys appear normal without evidence of urinary tract calculus, suspicious lesion or hydronephrosis. No bladder abnormalities are seen. Stomach/Bowel: No evidence of bowel wall thickening, distention or surrounding inflammatory change. Mild gastric wall thickening appears similar to the previous study. The appendix appears normal. Vascular/Lymphatic: There are no enlarged abdominal or pelvic lymph nodes. Aortic and branch vessel atherosclerosis. Retroaortic left renal vein. Reproductive: The prostate gland and seminal vesicles appear normal. Other: Intact abdominal wall.  No ascites or peritoneal nodularity. Musculoskeletal: No acute or significant osseous findings. IMPRESSION: 1. Large right lower lobe infrahilar lung mass highly  suspicious for bronchogenic carcinoma. This mass tents the right major fissure inferiorly in protrudes into the right middle lobe. 2. No definite metastatic disease within the chest, abdomen or pelvis. 3. Stable chronic left hepatic lobe lesion, consistent with a benign finding. 4. Aortic Atherosclerosis (ICD10-I70.0) and Emphysema (ICD10-J43.9). Electronically Signed   By: Richardean Sale M.D.   On: 10/18/2019 15:38    Procedures .Critical Care Performed by: Daleen Bo, MD Authorized by: Daleen Bo, MD   Critical care provider statement:    Critical care time (minutes):  80   Critical care start time:  10/18/2019 8:25 AM   Critical care end time:  10/18/2019 4:15 PM   Critical care time was exclusive of:  Separately billable procedures and treating other patients   Critical care was time spent personally by me on the following activities:  Blood draw for specimens, development of treatment plan with patient  or surrogate, discussions with consultants, evaluation of patient's response to treatment, examination of patient, obtaining history from patient or surrogate, ordering and performing treatments and interventions, ordering and review of laboratory studies, pulse oximetry, re-evaluation of patient's condition, review of old charts and ordering and review of radiographic studies   (including critical care time)  Medications Ordered in ED Medications  gadobutrol (GADAVIST) 1 MMOL/ML injection 9 mL (9 mLs Intravenous Contrast Given 10/18/19 1157)  iohexol (OMNIPAQUE) 300 MG/ML solution 100 mL (100 mLs Intravenous Contrast Given 10/18/19 1447)  dexamethasone (DECADRON) tablet 4 mg (4 mg Oral Given 10/18/19 1506)    ED Course  I have reviewed the triage vital signs and the nursing notes.  Pertinent labs & imaging results that were available during my care of the patient were reviewed by me and considered in my medical decision making (see chart for details).  Clinical Course as of Oct 17 1613  Fri Oct 18, 2019  1053 CT images interpreted per radiologist, Dr. Carlis Abbott.  I discussed these findings with him.  He is concerned about tumor in the left cerebellum, more likely than CVA.  MRI imaging is recommended for additional definition of this process.   [EW]  5681 Findings have been discussed with patient and wife, regarding need for additional evaluation prior to determination of treatment course.   [EW]  1435 The case was discussed with neurosurgery, Dr. Ellene Route.  He recommends starting Decadron 4 mg twice daily.  He can see the patient in the office this coming week for reevaluation and possible surgical excision of brain mass.  He recommends screening with CT chest abdomen pelvis, prior to leaving the ED today.  I have informed the patient, and his wife and he agrees to proceed.   [EW]  1609 Normal except white count elevated  CBC with Differential/Platelet(!) [EW]  1609 Normal  SARS Coronavirus 2 by RT PCR (hospital order, performed in [place] hospital lab) Nasopharyngeal Nasopharyngeal Swab [EW]  1610 Normal except sodium low, chloride low, glucose high, total protein high   [EW]    Clinical Course User Index [EW] Daleen Bo, MD   MDM Rules/Calculators/A&P                           Patient Vitals for the past 24 hrs:  BP Temp Temp src Pulse Resp SpO2 Height Weight  10/18/19 1058 - - - 69 18 99 % - -  10/18/19 0958 127/69 - - (!) 41 17 96 % - -  10/18/19 0909 - - - 67 17 97 % - -  10/18/19 0825 - - - - - - 5\' 8"  (1.727 m) 83.5 kg  10/18/19 0824 (!) 145/94 97.9 F (36.6 C) Oral 84 15 99 % - -    4:09 PM Reevaluation with update and discussion. After initial assessment and treatment, an updated evaluation reveals he remains alert and appears comfortable.  I have updated patient and his wife on the CT findings indicative of lung cancer.  This indicates that it is the primary for the metastatic lesion seen on MRI imaging of the brain.  Patient's wife has  discussed a follow-up appointment with neurosurgery, in 3 days at their office.  Findings discussed and questions answered. Daleen Bo   Medical Decision Making:  This patient is presenting for evaluation of neck pain with headache ongoing for 2 weeks, which does require a range of treatment options, and is a complaint that involves  a high risk of morbidity and mortality. The differential diagnoses include tension headache, cervical radiculopathy, intracranial mass. I decided to review old records, and in summary healthy male without trauma, presenting with neck and headache pains, without systemic illness.  I did not require additional historical information from anyone.  Clinical Laboratory Tests Ordered, included CBC and Metabolic panel. Review indicates Essentially normal tests, not requiring intervention. Radiologic Tests Ordered, included CT head and cervical spine, followed by MRI of the brain.  Subsequently ordered CT chest abdomen pelvis..  I independently Visualized: Radiographic images, which show likely lung primary cancer with secondary left cerebellar metastases.  No cervical spine fracture or dislocation    Critical Interventions-clinical evaluation, CT imaging, follow-up MRI imaging, follow-up CT imaging, laboratory testing, observation reassessment  After These Interventions, the Patient was reevaluated and was found stable for discharge.  Suspect headache, and dizziness related to edema secondary to left cerebellar metastases.  Screening for primary, indicates right lung cancer, and a non-smoker.  Patient does not have criteria for hospitalization at this time and has follow-up planned with neurosurgery for possible intervention as needed.  He has been started on Decadron to control symptoms and improve balance.  He will need follow-up with oncology following tissue diagnosis.  I have asked the patient to follow-up with his primary care doctor to assist with further evaluation  and treatment, as needed.  Following a tissue diagnosis, he can then see oncology for staging and planning.  CRITICAL CARE-yes Performed by: Daleen Bo  Nursing Notes Reviewed/ Care Coordinated Applicable Imaging Reviewed Interpretation of Laboratory Data incorporated into ED treatment  The patient appears reasonably screened and/or stabilized for discharge and I doubt any other medical condition or other [facility] requiring further screening, evaluation, or treatment in the ED at this time prior to discharge.  Plan: Home Medications-symptomatic OTC care; Home Treatments-rest, fluids; return here if the recommended treatment, does not improve the symptoms; Recommended follow up-neurosurgery, in 3 days as scheduled.  Primary care within a week to help follow-up care planning.  Oncology expectantly after tissue diagnosis.     Final Clinical Impression(s) / ED Diagnoses Final diagnoses:  Primary malignant neoplasm of right lung metastatic to other site [facility])  Nonintractable headache, unspecified chronicity pattern, unspecified headache type    Rx / DC Orders ED Discharge Orders         Ordered    dexamethasone (DECADRON) 4 MG tablet  2 times daily with meals     Discontinue  Reprint     10/18/19 1607           Daleen Bo, MD 10/18/19 1615

## 2019-10-21 ENCOUNTER — Telehealth: Payer: Self-pay | Admitting: Pulmonary Disease

## 2019-10-21 ENCOUNTER — Other Ambulatory Visit: Payer: Self-pay | Admitting: Radiation Oncology

## 2019-10-21 DIAGNOSIS — R918 Other nonspecific abnormal finding of lung field: Secondary | ICD-10-CM

## 2019-10-21 DIAGNOSIS — C3490 Malignant neoplasm of unspecified part of unspecified bronchus or lung: Secondary | ICD-10-CM

## 2019-10-21 DIAGNOSIS — Z5181 Encounter for therapeutic drug level monitoring: Secondary | ICD-10-CM

## 2019-10-21 NOTE — Telephone Encounter (Signed)
PCCM:  I received a call from Dr. Ellene Route [facility].   New Lung mass with brain MET requesting tissue dx.   I have spoke with Dr. Lamonte Sakai who is available to do the case this Thursday after.   I spoke with Larene Beach in ENDO who will reserve the spot for Thursday afternoon.   CC: Procedure Pool, and [facility] [address] Pool   Garner Nash, DO [facility] 10/21/2019 4:17 PM

## 2019-10-21 NOTE — Addendum Note (Signed)
Addended by: Amado Coe on: 10/21/2019 05:11 PM   Modules accepted: Orders

## 2019-10-21 NOTE — Telephone Encounter (Signed)
Per order  Diagnosis: Lung mass  Procedure: EBUS  Anesthesia: Yes  Do you need Fluro? None  Priority: high  Date: July 1st  Alternate Date:   Time: / PM  Location: [facility] End[facility]  Does patient have OSA? no DM? no Or Latex allergy? no  Medication Restriction: none  Anticoagulate/Antiplatelet: none  Pre-op Labs Ordered: CBC, CMP, PT/INR, PTT  Imaging request: none  (If, SuperDimension CT Chest, please have STAT courier sent to [facility] Office [address])   Please coordinate Pre-op COVID Testing  Will route to [facility] pool for follow up  Orders placed for labs

## 2019-10-22 ENCOUNTER — Other Ambulatory Visit: Payer: Self-pay | Admitting: Radiation Therapy

## 2019-10-22 ENCOUNTER — Other Ambulatory Visit (HOSPITAL_COMMUNITY)
Admission: RE | Admit: 2019-10-22 | Discharge: 2019-10-22 | Disposition: A | Discharge status: Home / Self Care | Payer: Managed Care, Other (non HMO) | Source: Ambulatory Visit | Attending: Pulmonary Disease | Admitting: Pulmonary Disease

## 2019-10-22 DIAGNOSIS — Z20822 Contact with and (suspected) exposure to covid-19: Secondary | ICD-10-CM | POA: Diagnosis not present

## 2019-10-22 DIAGNOSIS — Z01812 Encounter for preprocedural laboratory examination: Secondary | ICD-10-CM | POA: Insufficient documentation

## 2019-10-22 DIAGNOSIS — C7931 Secondary malignant neoplasm of brain: Secondary | ICD-10-CM

## 2019-10-22 DIAGNOSIS — C7949 Secondary malignant neoplasm of other parts of nervous system: Secondary | ICD-10-CM

## 2019-10-22 LAB — SARS CORONAVIRUS 2 (TAT 6-24 HRS): SARS Coronavirus 2: NEGATIVE

## 2019-10-23 NOTE — Progress Notes (Signed)
SDW-pre-op call was attempted by pt spouse, Vaughan Basta (DPR). Spouse was unsure of pt history so assessment was terminated and pre-op instructions were provided only. Please complete pt assessment on DOS. Spouse made aware to have pt stop taking  Aspirin (unless otherwise advised by surgeon), vitamins, fish oil and herbal medications. Do not take any NSAIDs ie: Ibuprofen, Advil, Naproxen (Aleve), Motrin, BC and Goody Powder.Spouse reminded to have pt quarantine. Spouse verbalized understanding of all pre-op instructions.

## 2019-10-24 ENCOUNTER — Ambulatory Visit (HOSPITAL_COMMUNITY): Payer: Managed Care, Other (non HMO)

## 2019-10-24 ENCOUNTER — Ambulatory Visit (HOSPITAL_COMMUNITY): Payer: Managed Care, Other (non HMO) | Admitting: Certified Registered Nurse Anesthetist

## 2019-10-24 ENCOUNTER — Encounter (HOSPITAL_COMMUNITY)
Admission: RE | Disposition: A | Discharge status: Home / Self Care | Payer: Self-pay | Source: Home / Self Care | Attending: Emergency Medicine

## 2019-10-24 ENCOUNTER — Other Ambulatory Visit: Payer: Self-pay

## 2019-10-24 ENCOUNTER — Encounter (HOSPITAL_COMMUNITY): Payer: Self-pay | Admitting: Emergency Medicine

## 2019-10-24 ENCOUNTER — Ambulatory Visit (HOSPITAL_COMMUNITY)
Admission: RE | Admit: 2019-10-24 | Discharge: 2019-10-24 | Disposition: A | Discharge status: Home / Self Care | Payer: Managed Care, Other (non HMO) | Attending: Emergency Medicine | Admitting: Emergency Medicine

## 2019-10-24 DIAGNOSIS — Z7951 Long term (current) use of inhaled steroids: Secondary | ICD-10-CM | POA: Diagnosis not present

## 2019-10-24 DIAGNOSIS — R918 Other nonspecific abnormal finding of lung field: Secondary | ICD-10-CM | POA: Insufficient documentation

## 2019-10-24 DIAGNOSIS — Z79899 Other long term (current) drug therapy: Secondary | ICD-10-CM | POA: Insufficient documentation

## 2019-10-24 DIAGNOSIS — Z7952 Long term (current) use of systemic steroids: Secondary | ICD-10-CM | POA: Diagnosis not present

## 2019-10-24 DIAGNOSIS — R590 Localized enlarged lymph nodes: Secondary | ICD-10-CM | POA: Insufficient documentation

## 2019-10-24 DIAGNOSIS — Z880 Allergy status to penicillin: Secondary | ICD-10-CM | POA: Diagnosis not present

## 2019-10-24 DIAGNOSIS — Z79890 Hormone replacement therapy (postmenopausal): Secondary | ICD-10-CM | POA: Insufficient documentation

## 2019-10-24 DIAGNOSIS — F17210 Nicotine dependence, cigarettes, uncomplicated: Secondary | ICD-10-CM | POA: Diagnosis not present

## 2019-10-24 DIAGNOSIS — Z9889 Other specified postprocedural states: Secondary | ICD-10-CM

## 2019-10-24 DIAGNOSIS — E039 Hypothyroidism, unspecified: Secondary | ICD-10-CM | POA: Diagnosis not present

## 2019-10-24 DIAGNOSIS — Z791 Long term (current) use of non-steroidal anti-inflammatories (NSAID): Secondary | ICD-10-CM | POA: Insufficient documentation

## 2019-10-24 DIAGNOSIS — I10 Essential (primary) hypertension: Secondary | ICD-10-CM | POA: Insufficient documentation

## 2019-10-24 DIAGNOSIS — K219 Gastro-esophageal reflux disease without esophagitis: Secondary | ICD-10-CM | POA: Insufficient documentation

## 2019-10-24 DIAGNOSIS — E785 Hyperlipidemia, unspecified: Secondary | ICD-10-CM | POA: Insufficient documentation

## 2019-10-24 HISTORY — DX: Hypothyroidism, unspecified: E03.9

## 2019-10-24 HISTORY — PX: ENDOBRONCHIAL ULTRASOUND: SHX5096

## 2019-10-24 HISTORY — PX: BRONCHIAL BRUSHINGS: SHX5108

## 2019-10-24 HISTORY — PX: BRONCHIAL NEEDLE ASPIRATION BIOPSY: SHX5106

## 2019-10-24 HISTORY — PX: VIDEO BRONCHOSCOPY: SHX5072

## 2019-10-24 IMAGING — DX DG CHEST 1V PORT
1 series · 1 of 1 positions shown · non-contrast
Comparison: PA and lateral chest [DATE]. CT chest, abdomen and
pelvis [DATE].

CLINICAL DATA: Status post bronchoscopy.

EXAM:
PORTABLE CHEST 1 VIEW

[chest ap]
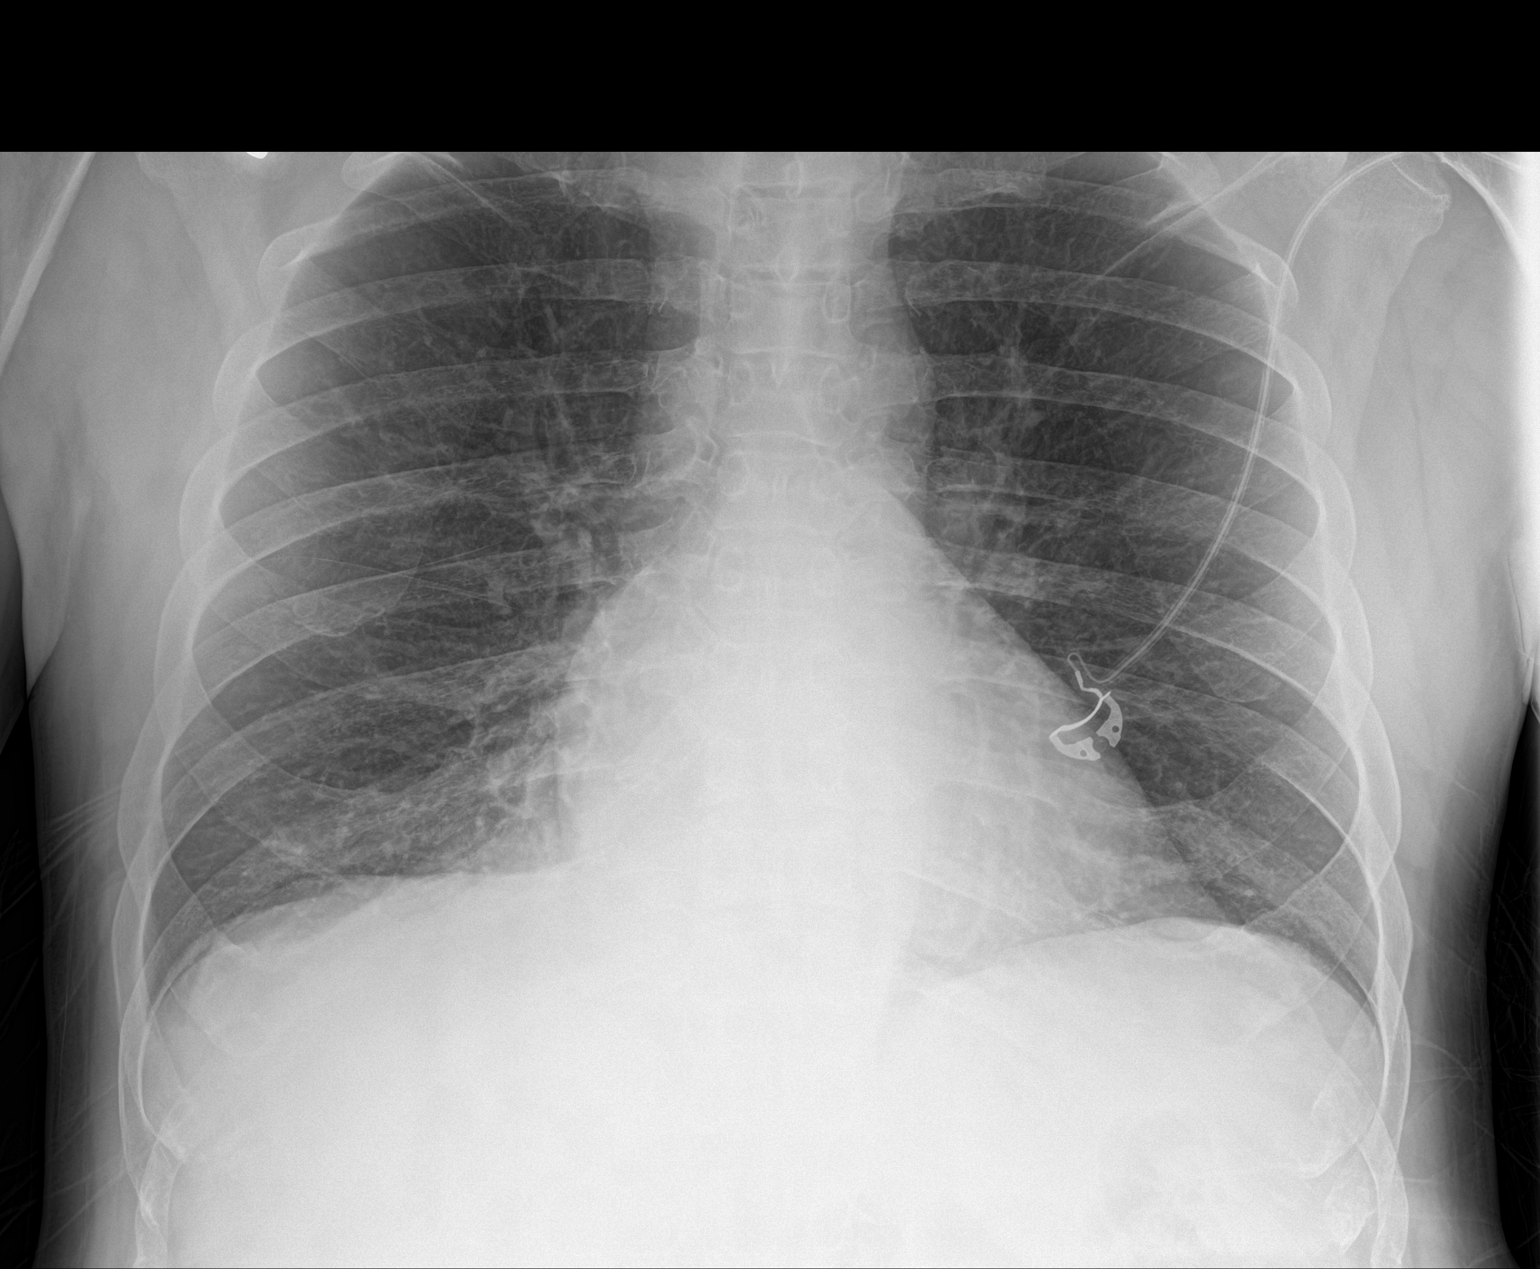

[1 of 1 positions shown; findings below may reference images not displayed]

FINDINGS: No pneumothorax. The lungs are emphysematous. Opacity in the medial
aspect of the right lung base correlates with the mass seen on the
prior CT. Heart size is normal.
IMPRESSION: Negative for pneumothorax after bronchoscopy.

Right lower lobe mass as seen on prior CT.

Atherosclerosis.

## 2019-10-24 SURGERY — VIDEO BRONCHOSCOPY WITHOUT FLUORO
Anesthesia: General

## 2019-10-24 SURGERY — BRONCHOSCOPY, WITH EBUS
Anesthesia: General

## 2019-10-24 MED ORDER — FENTANYL CITRATE (PF) 250 MCG/5ML IJ SOLN
INTRAMUSCULAR | Status: DC | PRN
  Administered 2019-10-24 (×2): 50 ug via INTRAVENOUS

## 2019-10-24 MED ORDER — LIDOCAINE 2% (20 MG/ML) 5 ML SYRINGE
INTRAMUSCULAR | Status: DC | PRN
  Administered 2019-10-24: 100 mg via INTRAVENOUS

## 2019-10-24 MED ORDER — ONDANSETRON HCL 4 MG/2ML IJ SOLN
INTRAMUSCULAR | Status: DC | PRN
  Administered 2019-10-24: 4 mg via INTRAVENOUS

## 2019-10-24 MED ORDER — LACTATED RINGERS IV SOLN: INTRAVENOUS | Status: DC

## 2019-10-24 MED ORDER — DEXMEDETOMIDINE HCL 200 MCG/2ML IV SOLN
INTRAVENOUS | Status: DC | PRN
  Administered 2019-10-24 (×2): 12 ug via INTRAVENOUS

## 2019-10-24 MED ORDER — DEXAMETHASONE SODIUM PHOSPHATE 10 MG/ML IJ SOLN
INTRAMUSCULAR | Status: DC | PRN
  Administered 2019-10-24: 5 mg via INTRAVENOUS

## 2019-10-24 MED ORDER — PROPOFOL 10 MG/ML IV BOLUS
INTRAVENOUS | Status: DC | PRN
  Administered 2019-10-24: 150 mg via INTRAVENOUS

## 2019-10-24 MED ORDER — CHLORHEXIDINE GLUCONATE 0.12 % MT SOLN: 15.0000 mL | Freq: Once | OROMUCOSAL | Status: AC

## 2019-10-24 MED ORDER — MIDAZOLAM HCL 2 MG/2ML IJ SOLN
INTRAMUSCULAR | Status: DC | PRN
  Administered 2019-10-24: 2 mg via INTRAVENOUS

## 2019-10-24 MED ORDER — CHLORHEXIDINE GLUCONATE 0.12 % MT SOLN
OROMUCOSAL | Status: AC
  Administered 2019-10-24: 15 mL via OROMUCOSAL
  Filled 2019-10-24: qty 15

## 2019-10-24 MED ORDER — SUGAMMADEX SODIUM 200 MG/2ML IV SOLN
INTRAVENOUS | Status: DC | PRN
  Administered 2019-10-24 (×2): 100 mg via INTRAVENOUS

## 2019-10-24 MED ORDER — ROCURONIUM BROMIDE 10 MG/ML (PF) SYRINGE
PREFILLED_SYRINGE | INTRAVENOUS | Status: DC | PRN
  Administered 2019-10-24: 100 mg via INTRAVENOUS

## 2019-10-24 NOTE — H&P (Signed)
Joseph Atkins is an 59 y.o. male.   Chief Complaint: Right lung mass HPI: 59 year old smoker with a history of hypothyroidism, substance abuse, GERD.  He was evaluated in the emergency department at [facility] on 6/for headache.  He was unfortunately found to have a 2 cm left cerebellar mass with associated edema.  This prompted further imaging including a CT chest sick/25/21 which I have reviewed, shows a right lower lobe mass with central necrosis, small subcarinal lymph node.  He presents today to discuss bronchoscopy, endobronchial ultrasound, tissue diagnosis.  His headache is improved.  He was started on dexamethasone.  Procedure discussed with him in detail.  All questions answered.  Risk and benefits reviewed.  He understands and agrees to proceed.  Past Medical History:  Diagnosis Date  . Diverticulosis   . GERD (gastroesophageal reflux disease)   . Hx of small bowel obstruction   . Hyperlipidemia   . Hypothyroidism   . IBS (irritable bowel syndrome)   . Substance abuse (HCC)    Alcoholic, Drug addition  . Thyroid disease     Past Surgical History:  Procedure Laterality Date  . arm surgery Right   . FINGER SURGERY Right    Middle    Family History  Problem Relation Age of Onset  . Epilepsy Mother   . Heart disease Mother   . Heart disease Brother   . Lung cancer Paternal Uncle    Social History:  reports that he has been smoking cigarettes. He has been smoking about 0.10 packs per day. He has quit using smokeless tobacco.  His smokeless tobacco use included chew. He reports that he does not drink alcohol and does not use drugs.  Allergies:  Allergies  Allergen Reactions  . Penicillins Other (See Comments)    Childhood allergy.  Has patient had a PCN reaction causing immediate rash, facial/tongue/throat swelling, SOB or lightheadedness with hypotension: unknown Has patient had a PCN reaction causing severe rash involving mucus membranes or skin necrosis:  unknown Has patient had a PCN reaction that required hospitalization: unknown Has patient had a PCN reaction occurring within the last 10 years: no If all of the above answers are "NO", then may proceed with Cephalosporin use.     Medications Prior to Admission  Medication Sig Dispense Refill  . albuterol (PROVENTIL HFA;VENTOLIN HFA) 108 (90 Base) MCG/ACT inhaler Inhale 2 puffs into the lungs every 6 (six) hours as needed for wheezing or shortness of breath. 1 Inhaler 0  . cyclobenzaprine (FLEXERIL) 10 MG tablet Take 10 mg by mouth 2 (two) times daily as needed for muscle spasms.    Marland Kitchen dexamethasone (DECADRON) 4 MG tablet Take 1 tablet (4 mg total) by mouth 2 (two) times daily with a meal. 60 tablet 0  . docusate sodium (COLACE) 100 MG capsule Take 100 mg by mouth daily.    . fluticasone furoate-vilanterol (BREO ELLIPTA) 100-25 MCG/INH AEPB Inhale 1 puff into the lungs daily. 30 each 3  . ibuprofen (ADVIL) 800 MG tablet Take 800 mg by mouth 3 (three) times daily.    Marland Kitchen levothyroxine (SYNTHROID) 75 MCG tablet Take 75 mcg by mouth daily.    Marland Kitchen omeprazole (PRILOSEC) 40 MG capsule Take 1 capsule (40 mg total) by mouth daily. 90 capsule 4  . ondansetron (ZOFRAN) 4 MG tablet Take 4 mg by mouth every 8 (eight) hours as needed.    . TRULANCE 3 MG TABS Take 1 tablet by mouth daily.    . polyethylene glycol  powder (MIRALAX) powder Take 17 g by mouth daily. 255 g 11    No results found for this or any previous visit (from the past 48 hour(s)). No results found.  Review of Systems As per HPI  Blood pressure 134/75, pulse 68, temperature 98.2 F (36.8 C), temperature source Oral, resp. rate 18, height 5\' 8"  (1.727 m), weight 83.5 kg, SpO2 98 %. 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  We discussed bronchoscopy for tissue diagnosis.  He understands and agrees.  We will proceed with FOB under general anesthesia, endobronchial ultrasound and biopsies.  Collene Gobble, MD 10/24/2019, 1:09 PM

## 2019-10-24 NOTE — H&P (View-Only) (Signed)
Joseph Atkins is an 59 y.o. male.   Chief Complaint: Right lung mass HPI: 59 year old smoker with a history of hypothyroidism, substance abuse, GERD.  He was evaluated in the emergency department at [facility] on 6/for headache.  He was unfortunately found to have a 2 cm left cerebellar mass with associated edema.  This prompted further imaging including a CT chest sick/25/21 which I have reviewed, shows a right lower lobe mass with central necrosis, small subcarinal lymph node.  He presents today to discuss bronchoscopy, endobronchial ultrasound, tissue diagnosis.  His headache is improved.  He was started on dexamethasone.  Procedure discussed with him in detail.  All questions answered.  Risk and benefits reviewed.  He understands and agrees to proceed.  Past Medical History:  Diagnosis Date  . Diverticulosis   . GERD (gastroesophageal reflux disease)   . Hx of small bowel obstruction   . Hyperlipidemia   . Hypothyroidism   . IBS (irritable bowel syndrome)   . Substance abuse (HCC)    Alcoholic, Drug addition  . Thyroid disease     Past Surgical History:  Procedure Laterality Date  . arm surgery Right   . FINGER SURGERY Right    Middle    Family History  Problem Relation Age of Onset  . Epilepsy Mother   . Heart disease Mother   . Heart disease Brother   . Lung cancer Paternal Uncle    Social History:  reports that he has been smoking cigarettes. He has been smoking about 0.10 packs per day. He has quit using smokeless tobacco.  His smokeless tobacco use included chew. He reports that he does not drink alcohol and does not use drugs.  Allergies:  Allergies  Allergen Reactions  . Penicillins Other (See Comments)    Childhood allergy.  Has patient had a PCN reaction causing immediate rash, facial/tongue/throat swelling, SOB or lightheadedness with hypotension: unknown Has patient had a PCN reaction causing severe rash involving mucus membranes or skin necrosis:  unknown Has patient had a PCN reaction that required hospitalization: unknown Has patient had a PCN reaction occurring within the last 10 years: no If all of the above answers are "NO", then may proceed with Cephalosporin use.     Medications Prior to Admission  Medication Sig Dispense Refill  . albuterol (PROVENTIL HFA;VENTOLIN HFA) 108 (90 Base) MCG/ACT inhaler Inhale 2 puffs into the lungs every 6 (six) hours as needed for wheezing or shortness of breath. 1 Inhaler 0  . cyclobenzaprine (FLEXERIL) 10 MG tablet Take 10 mg by mouth 2 (two) times daily as needed for muscle spasms.    Marland Kitchen dexamethasone (DECADRON) 4 MG tablet Take 1 tablet (4 mg total) by mouth 2 (two) times daily with a meal. 60 tablet 0  . docusate sodium (COLACE) 100 MG capsule Take 100 mg by mouth daily.    . fluticasone furoate-vilanterol (BREO ELLIPTA) 100-25 MCG/INH AEPB Inhale 1 puff into the lungs daily. 30 each 3  . ibuprofen (ADVIL) 800 MG tablet Take 800 mg by mouth 3 (three) times daily.    Marland Kitchen levothyroxine (SYNTHROID) 75 MCG tablet Take 75 mcg by mouth daily.    Marland Kitchen omeprazole (PRILOSEC) 40 MG capsule Take 1 capsule (40 mg total) by mouth daily. 90 capsule 4  . ondansetron (ZOFRAN) 4 MG tablet Take 4 mg by mouth every 8 (eight) hours as needed.    . TRULANCE 3 MG TABS Take 1 tablet by mouth daily.    . polyethylene glycol  powder (MIRALAX) powder Take 17 g by mouth daily. 255 g 11    No results found for this or any previous visit (from the past 48 hour(s)). No results found.  Review of Systems As per HPI  Blood pressure 134/75, pulse 68, temperature 98.2 F (36.8 C), temperature source Oral, resp. rate 18, height 5\' 8"  (1.727 m), weight 83.5 kg, SpO2 98 %. 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  We discussed bronchoscopy for tissue diagnosis.  He understands and agrees.  We will proceed with FOB under general anesthesia, endobronchial ultrasound and biopsies.  Collene Gobble, MD 10/24/2019, 1:09 PM

## 2019-10-24 NOTE — Anesthesia Procedure Notes (Signed)
Procedure Name: Intubation Date/Time: 10/24/2019 1:29 PM Performed by: Kathryne Hitch, CRNA Pre-anesthesia Checklist: Patient identified, Emergency Drugs available, Suction available and Patient being monitored Patient Re-evaluated:Patient Re-evaluated prior to induction Oxygen Delivery Method: Circle system utilized Preoxygenation: Pre-oxygenation with 100% oxygen Induction Type: IV induction Ventilation: Mask ventilation without difficulty Laryngoscope Size: Miller and 3 Grade View: Grade I Tube type: Oral Tube size: 8.5 mm Number of attempts: 1 Airway Equipment and Method: Stylet and Oral airway Placement Confirmation: ETT inserted through vocal cords under direct vision,  positive ETCO2 and breath sounds checked- equal and bilateral Secured at: 25 cm Tube secured with: Tape Dental Injury: Teeth and Oropharynx as per pre-operative assessment

## 2019-10-24 NOTE — Discharge Instructions (Signed)
Flexible Bronchoscopy, Care After This sheet gives you information about how to care for yourself after your test. Your doctor may also give you more specific instructions. If you have problems or questions, contact your doctor. Follow these instructions at home: Eating and drinking  Do not eat or drink anything (not even water) for 2 hours after your test, or until your numbing medicine (local anesthetic) wears off.  When your numbness is gone and your cough and gag reflexes have come back, you may: ? Eat only soft foods. ? Slowly drink liquids.  The day after the test, go back to your normal diet. Driving  Do not drive for 24 hours if you were given a medicine to help you relax (sedative).  Do not drive or use heavy machinery while taking prescription pain medicine. General instructions   Take over-the-counter and prescription medicines only as told by your doctor.  Return to your normal activities as told. Ask what activities are safe for you.  Do not use any products that have nicotine or tobacco in them. This includes cigarettes and e-cigarettes. If you need help quitting, ask your doctor.  Keep all follow-up visits as told by your doctor. This is important. It is very important if you had a tissue sample (biopsy) taken. Get help right away if:  You have shortness of breath that gets worse.  You get light-headed.  You feel like you are going to pass out (faint).  You have chest pain.  You cough up: ? More than a little blood. ? More blood than before. Summary  Do not eat or drink anything (not even water) for 2 hours after your test, or until your numbing medicine wears off.  Do not use cigarettes. Do not use e-cigarettes.  Get help right away if you have chest pain.  Please call our office for any questions or concerns.  [phone].  This information is not intended to replace advice given to you by your health care provider. Make sure you discuss any  questions you have with your health care provider. Document Revised: 03/24/2017 Document Reviewed: 04/29/2016 Elsevier Patient Education  2020 [employer].

## 2019-10-24 NOTE — Anesthesia Preprocedure Evaluation (Signed)
Anesthesia Evaluation  Patient identified by MRN, date of birth, ID band Patient awake    Reviewed: Allergy & Precautions, NPO status , Patient's Chart, lab work & pertinent test results  Airway Mallampati: I  TM Distance: >3 FB Neck ROM: Full    Dental   Pulmonary Current Smoker,    Pulmonary exam normal        Cardiovascular hypertension, Pt. on medications Normal cardiovascular exam     Neuro/Psych    GI/Hepatic GERD  Medicated and Controlled,  Endo/Other    Renal/GU      Musculoskeletal   Abdominal   Peds  Hematology   Anesthesia Other Findings   Reproductive/Obstetrics                             Anesthesia Physical Anesthesia Plan  ASA: III  Anesthesia Plan: General   Post-op Pain Management:    Induction: Intravenous  PONV Risk Score and Plan: 1  Airway Management Planned: Oral ETT  Additional Equipment:   Intra-op Plan:   Post-operative Plan: Extubation in OR  Informed Consent: I have reviewed the patients History and Physical, chart, labs and discussed the procedure including the risks, benefits and alternatives for the proposed anesthesia with the patient or authorized representative who has indicated his/her understanding and acceptance.       Plan Discussed with: CRNA and Surgeon  Anesthesia Plan Comments:         Anesthesia Quick Evaluation

## 2019-10-24 NOTE — Op Note (Signed)
Video Bronchoscopy with Endobronchial Ultrasound Procedure Note  Date of Operation: 10/24/2019  Pre-op Diagnosis: Right lower lobe mass, mediastinal adenopathy  Post-op Diagnosis: Same  Surgeon: Baltazar Apo  Assistants: None  Anesthesia: General endotracheal anesthesia  Operation: Flexible video fiberoptic bronchoscopy with endobronchial ultrasound and biopsies.  Estimated Blood Loss: Minimal  Complications: None apparent  Indications and History: Joseph Atkins is a 59 y.o. male with history of tobacco use.  He was found to have a new cerebellar mass which prompted chest evaluation.  A CT chest identified a necrosing right lower lobe medial mass as well as an enlarged subcarinal lymph node.  Recommendation was made to achieve tissue diagnosis via bronchoscopy with endobronchial ultrasound.  The risks, benefits, complications, treatment options and expected outcomes were discussed with the patient.  The possibilities of pneumothorax, pneumonia, reaction to medication, pulmonary aspiration, perforation of a viscus, bleeding, failure to diagnose a condition and creating a complication requiring transfusion or operation were discussed with the patient who freely signed the consent.    Description of Procedure: The patient was examined in the preoperative area and history and data from the preprocedure consultation were reviewed. It was deemed appropriate to proceed.  The patient was taken to [facility] endoscopy room 1, identified as Joseph Atkins and the procedure verified as Flexible Video Fiberoptic Bronchoscopy.  A Time Out was held and the above information confirmed. After being taken to the operating room general anesthesia was initiated and the patient  was orally intubated. The video fiberoptic bronchoscope was introduced via the endotracheal tube and a general inspection was performed which showed normal airways throughout.  There were no other bronchial lesions or abnormal  secretions seen. The standard scope was then withdrawn and the endobronchial ultrasound was used to identify and characterize the peritracheal, hilar and bronchial lymph nodes. Inspection showed enlargement of a fairly deep subcarinal node, station 7.  I was able to also identify an enlarged node at station 12 R. Using real-time ultrasound guidance Wang needle biopsies were take from Station 7 and 12 are nodes and were sent for cytology.   The standard scope was then reintroduced and under fluoroscopic guidance transbronchial brushings were performed from multiple subsegments of the medial right lower lobe to be sent for cytology.  The patient tolerated the procedure well without apparent complications. There was no significant blood loss. The bronchoscope was withdrawn. Anesthesia was reversed and the patient was taken to the PACU for recovery.   Samples: 1. Wang needle biopsies from 7 node 2. Wang needle biopsies from 12R node 3. Transbronchial brushings from the right lower lobe  Plans:  The patient will be discharged from the PACU to home when recovered from anesthesia and after CXR has been reviewed. We will review the cytology, pathology and microbiology results with the patient when they become available. Outpatient followup will be with Dr Lamonte Sakai or Dr Valeta Harms.  Baltazar Apo, MD, PhD 10/24/2019, 2:46 PM [facility] [phone] or if no answer [phone]

## 2019-10-24 NOTE — Transfer of Care (Signed)
Immediate Anesthesia Transfer of Care Note  Patient: Joseph Atkins  Procedure(s) Performed: VIDEO BRONCHOSCOPY WITHOUT FLUORO (N/A ) ENDOBRONCHIAL ULTRASOUND (N/A ) BRONCHIAL NEEDLE ASPIRATION BIOPSIES BRONCHIAL BRUSHINGS  Patient Location: PACU and Endoscopy Unit  Anesthesia Type:General  Level of Consciousness: drowsy  Airway & Oxygen Therapy: Patient Spontanous Breathing and Patient connected to nasal cannula oxygen  Post-op Assessment: Report given to RN and Post -op Vital signs reviewed and stable  Post vital signs: Reviewed and stable  Last Vitals:  Vitals Value Taken Time  BP 145/76 10/24/19 1454  Temp    Pulse 58 10/24/19 1457  Resp 15 10/24/19 1457  SpO2 95 % 10/24/19 1457  Vitals shown include unvalidated device data.  Last Pain:  Vitals:   10/24/19 1048  TempSrc:   PainSc: 1       Patients Stated Pain Goal: 0 (53/74/82 [id number])  Complications: No complications documented.

## 2019-10-24 NOTE — Anesthesia Postprocedure Evaluation (Signed)
Anesthesia Post Note  Patient: Joseph Atkins  Procedure(s) Performed: VIDEO BRONCHOSCOPY WITHOUT FLUORO (N/A ) ENDOBRONCHIAL ULTRASOUND (N/A ) BRONCHIAL NEEDLE ASPIRATION BIOPSIES BRONCHIAL BRUSHINGS     Patient location during evaluation: PACU Anesthesia Type: General Level of consciousness: awake and alert Pain management: pain level controlled Vital Signs Assessment: post-procedure vital signs reviewed and stable Respiratory status: spontaneous breathing, nonlabored ventilation, respiratory function stable and patient connected to nasal cannula oxygen Cardiovascular status: blood pressure returned to baseline and stable Postop Assessment: no apparent nausea or vomiting Anesthetic complications: no   No complications documented.  Last Vitals:  Vitals:   10/24/19 1514 10/24/19 1524  BP: (!) 142/70 (!) 141/78  Pulse: 66 (!) 59  Resp: 17 18  Temp:    SpO2: 96% 98%    Last Pain:  Vitals:   10/24/19 1524  TempSrc:   PainSc: 0-No pain                 Joseph Atkins

## 2019-10-25 ENCOUNTER — Encounter: Payer: Self-pay | Admitting: *Deleted

## 2019-10-25 ENCOUNTER — Telehealth: Payer: Self-pay | Admitting: Emergency Medicine

## 2019-10-25 ENCOUNTER — Telehealth: Payer: Self-pay | Admitting: Physician Assistant

## 2019-10-25 ENCOUNTER — Encounter (HOSPITAL_COMMUNITY): Payer: Self-pay | Admitting: Emergency Medicine

## 2019-10-25 DIAGNOSIS — R918 Other nonspecific abnormal finding of lung field: Secondary | ICD-10-CM

## 2019-10-25 LAB — CYTOLOGY - NON PAP

## 2019-10-25 NOTE — Progress Notes (Signed)
I received referral on Joseph Atkins today.  I updated Dr. Julien Nordmann.  He would like patient to be seen week of 11/04/19.  I updated new patient coordinator to call and schedule on 11/06/19 with Cassie.

## 2019-10-25 NOTE — Telephone Encounter (Signed)
Called the patient.  Unfortunately his cytologies, EBUS, transbronchial brushings were all negative for malignancy.  I think he needs another procedure, likely navigational bronchoscopy.  I discussed this with Dr. Valeta Harms earlier this week.  Need to set up ENB with Dr. Valeta Harms.  We will have to fit this in with the patient's radiation oncology and oncology appointments next week.  Please discuss with Dr. Valeta Harms to find the appropriate time.

## 2019-10-25 NOTE — Telephone Encounter (Signed)
Received a msg from thoracic navigator to schedule a new pt appt w/Cassie on 7/14 at 1:30pm w/labs at 1pm. Appt date and time has been given to the pt and his wife whom are aware to arrive 15 minutes early.

## 2019-10-29 NOTE — Telephone Encounter (Signed)
Dr Valeta Harms- please advise thanks

## 2019-10-29 NOTE — Telephone Encounter (Signed)
Spoke with the pt and his spouse and confirmed that this date would work for him  I have sent order to [facility] for this to be scheduled

## 2019-10-29 NOTE — Telephone Encounter (Signed)
Can we setup for repeat bronchoscopy with ENB for Tuesday 13th of next week at [facility]? Can be with Rob Byrum in the AM or myself.  Thanks Cunningham, DO [facility] 10/29/2019 2:28 PM

## 2019-10-30 ENCOUNTER — Other Ambulatory Visit: Payer: Managed Care, Other (non HMO)

## 2019-10-30 NOTE — Progress Notes (Signed)
Thoracic Location of Tumor / Histology: Primary Right Lower Lobe Lung cancer with Brain met  Patient presented to the ER with headache, neck pain, and dizziness ongoing for 2 weeks.  MRI Brain 11/07/2019:  ENB 11/05/2019:  PET 11/04/2019:  EBUS 10/24/2019: Negative for malignancy.  MRI Brain 10/18/2019: 2 cm rim enhancing mass left cerebellar with surrounding white matter edema. No second lesion identified. This is most consistent with metastatic disease and workup for malignancy is recommended. Brain abscess considered less likely due to lack of central restricted diffusion and location.  CT CAP 10/18/2019: Large right lower lobe infrahilar lung mass highly suspicious for bronchogenic carcinoma. This mass tents the right major fissure inferiorly in protrudes into the right middle lobe. No definite metastatic disease within the chest, abdomen or pelvis.  Stable chronic left hepatic lobe lesion, consistent with a benign finding.  CT Head 10/18/2019: Large low-density lesion in the left cerebellum measuring approximately 3 cm. Favor tumor. Infarct considered less likely. No obstructive hydrocephalus. Recommend MRI brain without with contrast  Biopsies of Lymph Nodes 10/24/2019   Biopsies of RLL Lung Brushings 10/24/2019    Tobacco/Marijuana/Snuff/ETOH use: Current Smoker  Past or anticipated interventions, if any, per neurosurgery:   Past/Anticipated interventions by medical oncology, if any:  Cassie 11/06/2019   Dose of Decadron, if applicable: 4 mg BID  Recent neurologic symptoms, if any:   Seizures: No  Headaches: No, haven't had one in a couple of days.   Nausea: Had episode of nausea/vomiting on Sunday 10/27/19.  Dizziness/ataxia: Gait is off balance, states it is better than it was.  Difficulty with hand coordination: No  Focal numbness/weakness: No  Visual deficits/changes: Having difficulty with reading  Confusion/Memory deficits: No   Signs/Symptoms  Weight changes,  if any: No  Respiratory complaints, if any: SOB noted with increased activity.  States this is normal for him due to his COPD.  Hemoptysis, if any: Non-productive cough, states its a smokers cough.  Pain issues, if any: No    SAFETY ISSUES:  Prior radiation? No  Pacemaker/ICD? No  Possible current pregnancy? n/a  Is the patient on methotrexate? No  Current Complaints / other details:   -Received covid 19 shot last week

## 2019-10-30 NOTE — Telephone Encounter (Signed)
ENB has been scheduled for 7/13 at 7:30 at [facility] Endo with Dr Lamonte Sakai.  I have called pt and made him aware. I have sent staff message to [place]. Pt had CT at [facility] on 6/25.  I spoke to [facility] in [place] - he checked and CT was not burned on disk so they do not have it.  A new CT will need to be ordered if needed.  Will route message to Lauren.

## 2019-10-31 ENCOUNTER — Ambulatory Visit
Admission: RE | Admit: 2019-10-31 | Discharge: 2019-10-31 | Disposition: A | Discharge status: Home / Self Care | Payer: Managed Care, Other (non HMO) | Source: Ambulatory Visit | Attending: Radiation Oncology | Admitting: Radiation Oncology

## 2019-10-31 ENCOUNTER — Telehealth: Payer: Self-pay | Admitting: *Deleted

## 2019-10-31 ENCOUNTER — Encounter: Payer: Self-pay | Admitting: General Practice

## 2019-10-31 ENCOUNTER — Encounter: Payer: Self-pay | Admitting: Radiation Oncology

## 2019-10-31 ENCOUNTER — Other Ambulatory Visit: Payer: Self-pay

## 2019-10-31 ENCOUNTER — Other Ambulatory Visit: Payer: Self-pay | Admitting: *Deleted

## 2019-10-31 VITALS — Ht 68.0 in | Wt 184.0 lb

## 2019-10-31 DIAGNOSIS — C7931 Secondary malignant neoplasm of brain: Secondary | ICD-10-CM

## 2019-10-31 DIAGNOSIS — C7949 Secondary malignant neoplasm of other parts of nervous system: Secondary | ICD-10-CM

## 2019-10-31 DIAGNOSIS — R918 Other nonspecific abnormal finding of lung field: Secondary | ICD-10-CM

## 2019-10-31 NOTE — Telephone Encounter (Signed)
Order placed for Super D to be done prior to broncho n7/13 will route to [facility] pool and Dr. Valeta Harms

## 2019-10-31 NOTE — Telephone Encounter (Signed)
----- Message from Ilona Sorrel sent at 10/31/2019 12:41 PM EDT ----- Regarding: FW: ENB You haven't put this in yet have you?  Judeen Hammans ----- Message ----- From: Garner Nash, DO Sent: 10/30/2019   4:51 PM EDT To: Amado Coe, RN, Collene Gobble, MD, # Subject: RE: ENB                                        He is going to need a repeat superD CT noncontrasted Thanks BLI  ----- Message ----- From: Amado Coe, RN Sent: 10/30/2019   4:47 PM EDT To: Collene Gobble, MD, Ilona Sorrel, # Subject: RE: ENB                                        Does the PET count for the SUper D? Or do I need to order new one? ----- Message ----- From: Ilona Sorrel Sent: 10/30/2019   2:49 PM EDT To: Amado Coe, RN, Collene Gobble, MD, # Subject: FW: ENB                                        I do see pt is scheduled for PET on 7/12.  Judeen Hammans ----- Message ----- From: Ilona Sorrel Sent: 10/30/2019   2:45 PM EDT To: Amado Coe, RN, Collene Gobble, MD, # Subject: RE: ENB                                        Pt has been scheduled for 7/13 at 7:30 with Dr Lamonte Sakai at [facility] room.  Pt had CT at [facility] on 6/25 and I checked with them for disk.  Spoke to [place] and he said disk was not burned so a new CT will need to be ordered.  Judeen Hammans ----- Message ----- From: Garner Nash, DO Sent: 10/30/2019   2:07 PM EDT To: Amado Coe, RN, Ilona Sorrel Subject: RE: ENB                                        Please schedule frost thing at 730 with [place]  Thanks Brad ----- Message ----- From: Ilona Sorrel Sent: 10/30/2019   2:04 PM EDT To: Amado Coe, RN, Garner Nash, DO Subject: ENB                                            Order was put in for ENB to be done 7/13 in the afternoon at [place].  I called to get this scheduled and was told Dr Valeta Harms already has pt at 1:10 and no one can be put after them - room has to be cleared at by 3:00  & no time.  She suggested 11:00 but per order Dr Valeta Harms has morning clinic.  There is no alternate date  listed on order.  When would you like for me to try to get this scheduled?   Judeen Hammans

## 2019-10-31 NOTE — Progress Notes (Signed)
The proposed treatment discussed in cancer conference 10/31/19 is for discussion purpose only and is not a binding recommendation.  The patient was not physically examined nor present for their treatment options.  Therefore, final treatment plans cannot be decided.

## 2019-10-31 NOTE — Progress Notes (Signed)
[facility] Initial Psychosocial Assessment Clinical Social Work  Clinical Social Work contacted by phone to assess psychosocial, emotional, mental health, and spiritual needs of the patient.   Barriers to care/review of distress screen:  - Transportation:  Do you anticipate any problems getting to appointments?  Do you have someone who can help run errands for you if you need it?  He is unable to drive due to risk of seizure.  His wife can help w transportation.   - Help at home:  What is your living situation (alone, family, other)?  If you are physically unable to care for yourself, who would you call on to help you?  Lives w wife and stepson. Both are able to assist as needed.  Lives in [place] of [place].   - Support system:  What does your support system look like?  Who would you call on if you needed some kind of practical help?  What if you needed someone to talk to for emotional support?  Married, wife very supportive.  Father lives nearby.  Gets together w family often.  Has supportive coworkers.   Finances:  Are you concerned about finances.  Considering returning to work?  If not, applying for disability?  Currently out of work and receiving short term disability.  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  Somewhat physical job.  Has short term and long term disability.  Now has [employer].  Wife works 3 AM - 12 Noon daily at [employer], she is a Freight forwarder. What is your understanding of where you are with your cancer? Its cause?  Your treatment plan and what happens next?  "The only thing I know is that I have a tumor in my brain and lung, went for biopsy but they didn't get enough to tell what it was."  Is currently scheduled for PET scan, but insurance doesn't want to pay until they get "the information they need."  Biopsy is needed in order for PET scan to be approved.  Has had 3 weeks of "real bad headaches, I was going to work but my head would be hurting  so bad I got sick on my stomach."  Called in to work, went to Urgent Care and PCP, was treated for headache and muscle tightness.  Several days later, his balance was impaired and he was sent to [facility] for further evaluation.  Was shown "tumor on my head, it hit me really hard, I was not expecting that."  Usually copes w worries by physical activity, activity is restricted due to concern of having a seizure.  CSW and patient discussed common feeling and emotions when being diagnosed with cancer, and the importance of support during treatment. CSW informed patient of the support team and support services at [facility]. CSW provided contact information and encouraged patient to call with any questions or concerns.  What are your worries for the future as you begin treatment for cancer?  "I want to be able to return to work and do my job."  Likes to stay busy, [place], does work on the property as a Animator.  "I like to stay busy doing something, I don't like sitting around."  Likes to garden, yard work.    Works on Education officer, community.    What are your hopes and priorities during your treatment? What is important to you? What are your goals for your care?  Staying as physically active as possible.  Concerned about impact of his illness on his wife.    CSW Summary:  Patient and family psychosocial functioning including strengths, limitations, and coping skills:  59 year old male with lung mass and "something in my head."  Undergoing work up and treatment planning process.  Worked at [employer], lives w wife and stepson in [place].  Came to seek medical attention due to severe headaches, nausea, loss of balance.  Was very surprised to be told in ED that there was "something in my head", did not suspect any kind of serious issue.  Likes to cope with stress by keeping busy.  This has been difficult because he has been told not to engage in any kind of strenuous physical  activity due to risk of seizure.  His normal activities (gardening, yard work and similar) are now not possible for him.  He likes to work, describes himself as a 'workaholic", very much wants to return to his former job as soon as possible.  Has good support from family who live nearby.  Identifications of barriers to care:  Wife would like to be able to continue to work and hopes appointments can allow for her to continue to do so.    Availability of community resources:  Estate agent, [facility] [place], [place].    Clinical Social Worker follow up needed: No.  Sent information packet and encouraged them to review and contact SW team as needed.   Edwyna Shell, LCSW Clinical Social Worker Phone:  [phone] Cell:  [phone]

## 2019-11-01 ENCOUNTER — Ambulatory Visit: Payer: Managed Care, Other (non HMO) | Admitting: Radiation Oncology

## 2019-11-01 ENCOUNTER — Ambulatory Visit: Payer: Managed Care, Other (non HMO)

## 2019-11-01 ENCOUNTER — Ambulatory Visit (HOSPITAL_BASED_OUTPATIENT_CLINIC_OR_DEPARTMENT_OTHER)
Admission: RE | Admit: 2019-11-01 | Discharge: 2019-11-01 | Disposition: A | Discharge status: Home / Self Care | Payer: Managed Care, Other (non HMO) | Source: Ambulatory Visit | Attending: Pulmonary Disease | Admitting: Pulmonary Disease

## 2019-11-01 DIAGNOSIS — R918 Other nonspecific abnormal finding of lung field: Secondary | ICD-10-CM

## 2019-11-01 IMAGING — CT CT CHEST SUPER D W/O CM
2 of 5 series · 15 of 36 positions shown, 18 images · non-contrast
Comparison: Chest CT [DATE]

CLINICAL DATA: Preop for bronchoscopic biopsy of a right lower lobe
lung mass. Known brain lesion.

EXAM:
CT CHEST WITHOUT CONTRAST
TECHNIQUE: Multidetector CT imaging of the chest was performed using thin slice
collimation for electromagnetic bronchoscopy planning purposes,
without intravenous contrast.

[Series 3: super d · axial · 0.95mm/px · z∈[-184,+109]mm · 12 of 408 slices shown, 15 images]
[im 21/408  mediastinal]
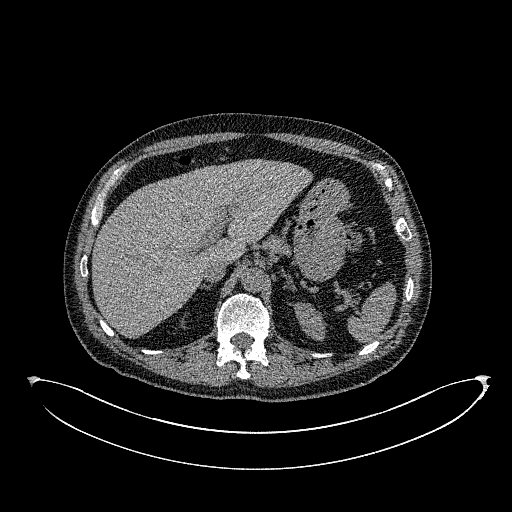
[im 21/408  lung]
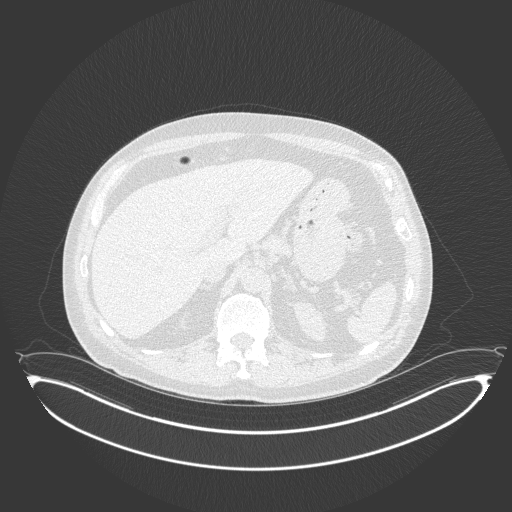
[im 62/408  lung]
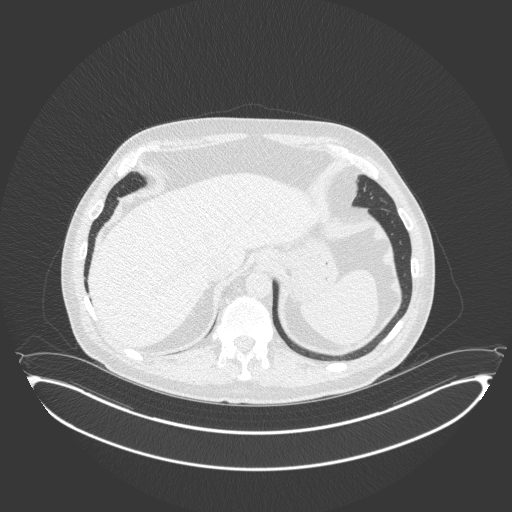
[im 82/408  lung]
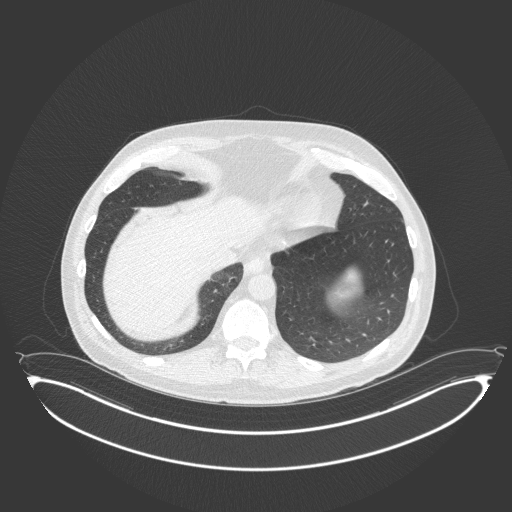
[im 123/408  lung]
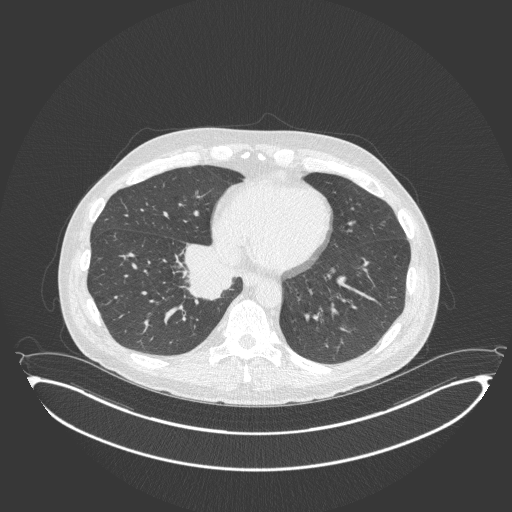
[im 163/408  mediastinal]
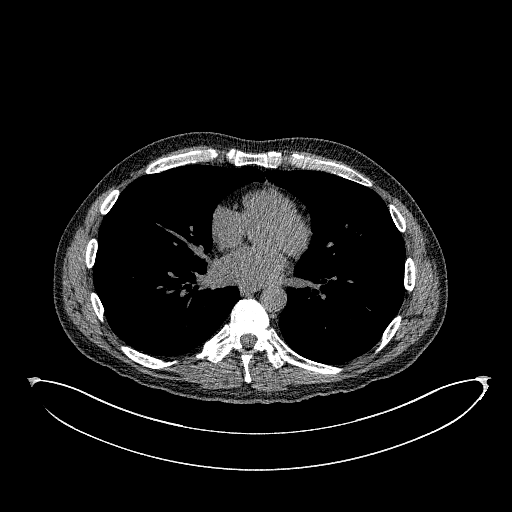
[im 163/408  lung]
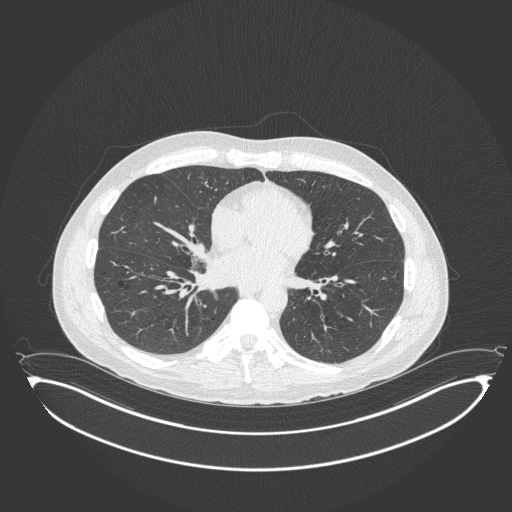
[im 184/408  lung]
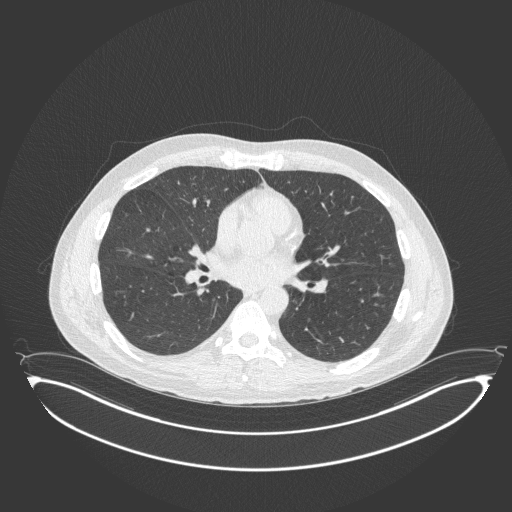
[im 224/408  lung]
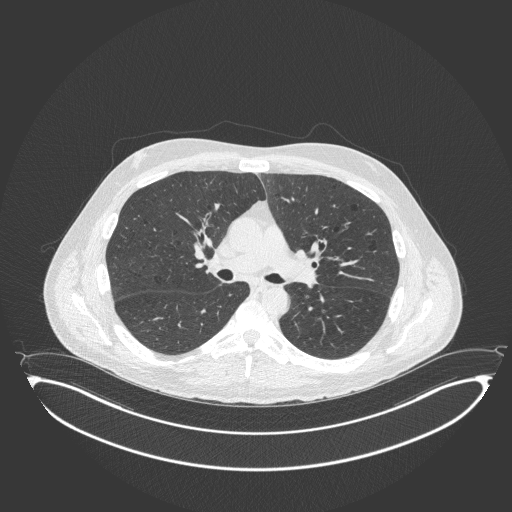
[im 245/408  lung]
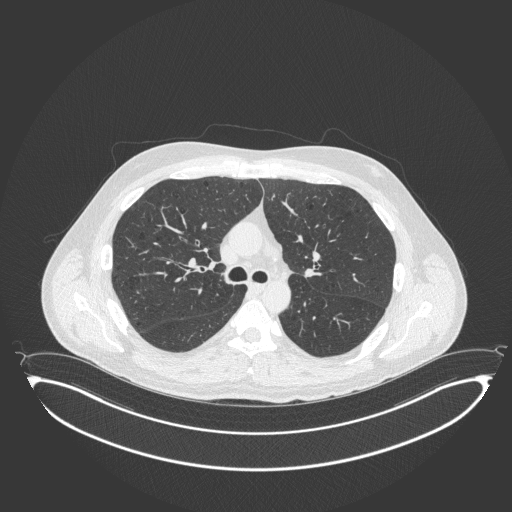
[im 285/408  mediastinal]
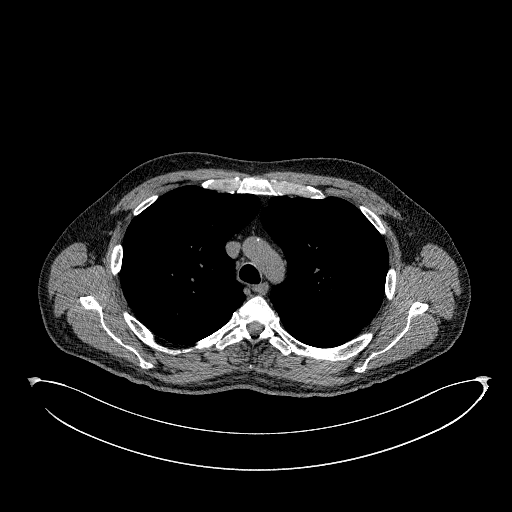
[im 285/408  lung]
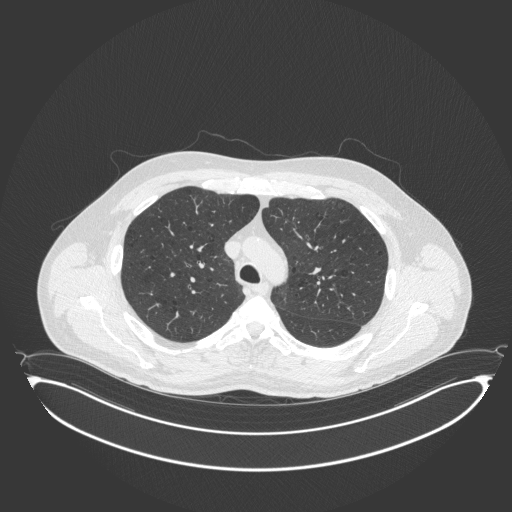
[im 326/408  lung]
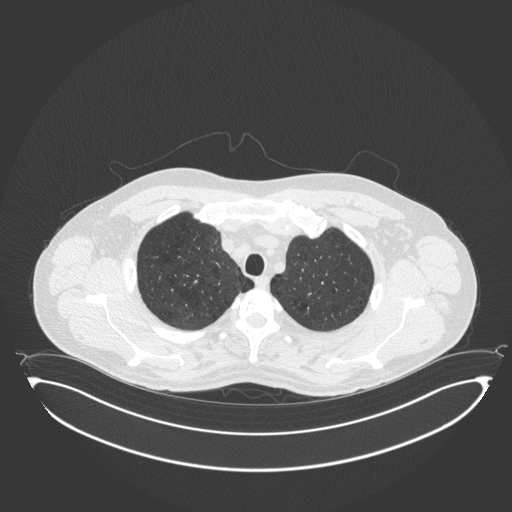
[im 346/408  lung]
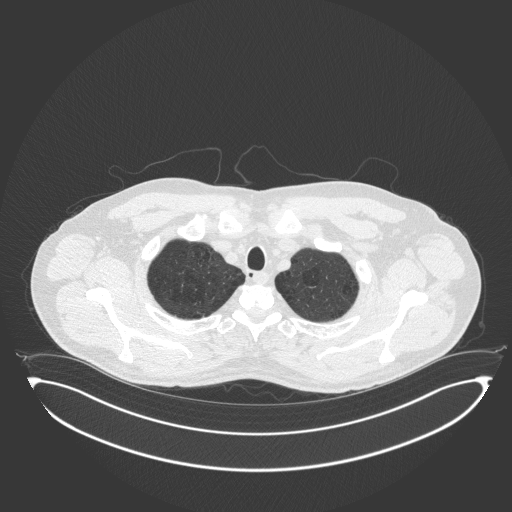
[im 387/408  lung]
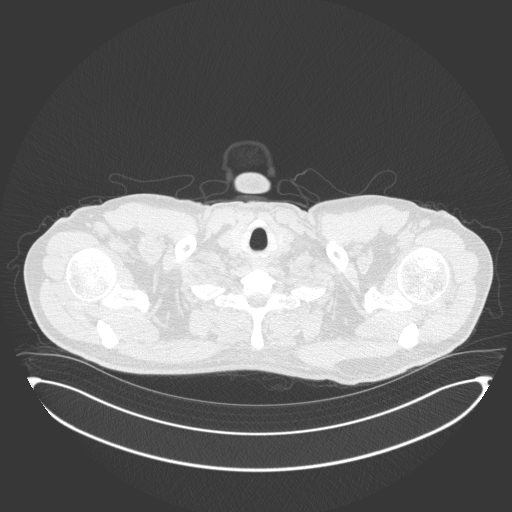

[Series 6: coronal · coronal · 0.66mm/px · 3 of 140 slices shown]
[im 28/140  lung]
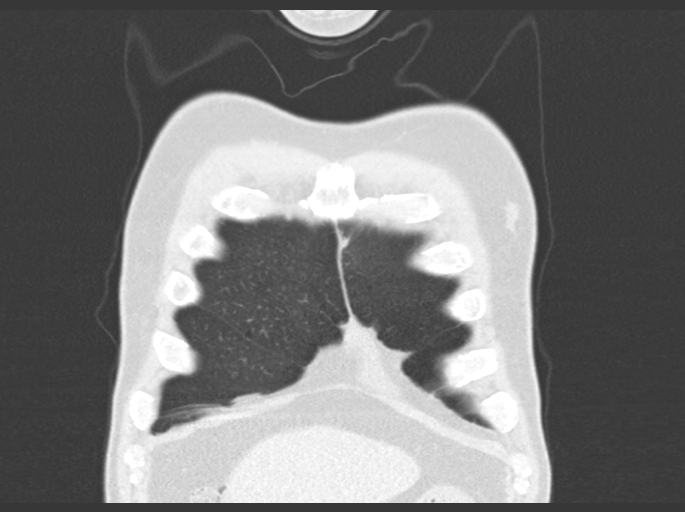
[im 56/140  lung]
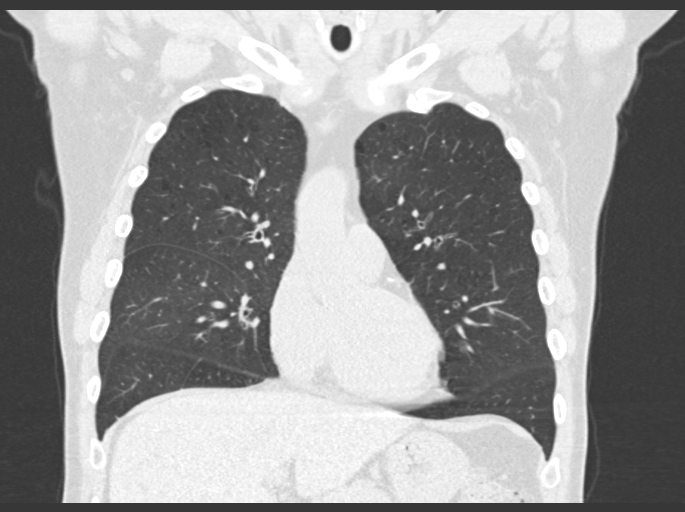
[im 84/140  lung]
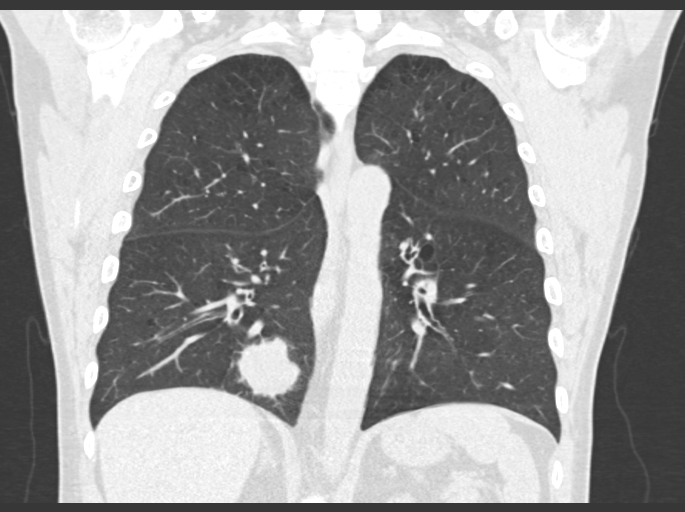

[15 of 36 positions shown; findings below may reference images not displayed]

FINDINGS: Cardiovascular: The heart is normal in size. Small amount of
pericardial fluid but no overt effusion. The aorta is normal in
caliber. Scattered atherosclerotic calcifications. Scattered
three-vessel coronary artery calcifications and aortic valve
calcifications.

Mediastinum/Nodes: No mediastinal or hilar mass or adenopathy. The
esophagus is grossly normal.

Lungs/Pleura: Stable appearing 5.5 cm right infrahilar/lower lobe
lung mass.

No other pulmonary nodules to suggest pulmonary metastatic disease.

Stable underlying emphysematous changes.

Upper Abdomen: No significant upper abdominal findings are
identified. No obvious hepatic or adrenal gland metastasis.

Musculoskeletal: No significant bony findings.
IMPRESSION: 1. Stable 5.5 cm right infrahilar/lower lobe lung mass.
2. No findings for pulmonary metastatic disease.
3. Stable emphysematous changes.
4. Emphysema and aortic atherosclerosis.

Aortic Atherosclerosis ([WC]-[WC]) and Emphysema ([WC]-[WC]).

## 2019-11-02 ENCOUNTER — Encounter (HOSPITAL_COMMUNITY): Payer: Self-pay | Admitting: Emergency Medicine

## 2019-11-02 ENCOUNTER — Other Ambulatory Visit (HOSPITAL_COMMUNITY)
Admission: RE | Admit: 2019-11-02 | Discharge: 2019-11-02 | Disposition: A | Discharge status: Home / Self Care | Payer: Managed Care, Other (non HMO) | Source: Ambulatory Visit | Attending: Emergency Medicine | Admitting: Emergency Medicine

## 2019-11-02 DIAGNOSIS — Z20822 Contact with and (suspected) exposure to covid-19: Secondary | ICD-10-CM | POA: Insufficient documentation

## 2019-11-02 DIAGNOSIS — Z01812 Encounter for preprocedural laboratory examination: Secondary | ICD-10-CM | POA: Insufficient documentation

## 2019-11-02 LAB — SARS CORONAVIRUS 2 (TAT 6-24 HRS): SARS Coronavirus 2: NEGATIVE

## 2019-11-02 NOTE — Progress Notes (Signed)
Patient denies chest pain, shortness of breath, or cardiology visit. Patient states he will remain in quarantine until after procedure. Educated on Environmental manager.

## 2019-11-03 DIAGNOSIS — C7931 Secondary malignant neoplasm of brain: Secondary | ICD-10-CM | POA: Insufficient documentation

## 2019-11-03 NOTE — Progress Notes (Signed)
Radiation Oncology         [phone]) [phone] ________________________________  Name: Joseph Atkins MRN: [id number]  Date: 10/31/2019  DOB: [birth year]  FU:XNATFT, Hessie Diener, NP  [name], NP     REFERRING PHYSICIAN: [name], NP   DIAGNOSIS: The primary encounter diagnosis was Secondary malignant neoplasm of brain and spinal cord ([place]). Diagnoses of Right lower lobe lung mass and Brain metastasis ([place]) were also pertinent to this visit.   HISTORY OF PRESENT ILLNESS::Joseph Atkins is a 59 y.o. male who is seen for an initial consultation visit regarding the patient's diagnosis of apparent metastatic lung cancer with brain metastasis.  The patient has been found to have a large right-sided lung mass.  On CT imaging, a 6.5 cm right lower lobe mass was seen.  No significant associated mediastinal nodes were seen and no clear metastases on CT imaging as well.  An MRI scan of the brain also has been completed, with the patient complaining of being off balance to some degree.  A 2 cm left cerebellar mass was seen with moderate to large edema.  The patient has begun steroids at 4 mg twice daily with Decadron.  The patient proceeded with bronchoscopy on 10/24/2019.  This did not demonstrate malignancy which remains suspected and therefore he is undergoing a repeat bronchoscopy in the near future which is being scheduled next week.  The patient also has a PET scan scheduled as well as a 3T MRI scan given the apparent brain metastasis.    PREVIOUS RADIATION THERAPY: No   PAST MEDICAL HISTORY:  has a past medical history of Diverticulosis, GERD (gastroesophageal reflux disease), small bowel obstruction, Hyperlipidemia, Hypothyroidism, IBS (irritable bowel syndrome), Substance abuse ([place]), and Thyroid disease.     PAST SURGICAL HISTORY: Past Surgical History:  Procedure Laterality Date  . arm surgery Right   . BRONCHIAL BRUSHINGS  10/24/2019   Procedure: BRONCHIAL BRUSHINGS;   Surgeon: Collene Gobble, MD;  Location: [facility] ENDOSCOPY;  Service: Cardiopulmonary;;  right lower lobe   . BRONCHIAL NEEDLE ASPIRATION BIOPSY  10/24/2019   Procedure: BRONCHIAL NEEDLE ASPIRATION BIOPSIES;  Surgeon: Collene Gobble, MD;  Location: [facility];  Service: Cardiopulmonary;;  . ENDOBRONCHIAL ULTRASOUND N/A 10/24/2019   Procedure: ENDOBRONCHIAL ULTRASOUND;  Surgeon: Collene Gobble, MD;  Location: [facility];  Service: Cardiopulmonary;  Laterality: N/A;  . FINGER SURGERY Right    Middle  . VIDEO BRONCHOSCOPY N/A 10/24/2019   Procedure: VIDEO BRONCHOSCOPY WITHOUT FLUORO;  Surgeon: Collene Gobble, MD;  Location: [facility] ENDOSCOPY;  Service: Cardiopulmonary;  Laterality: N/A;     FAMILY HISTORY: family history includes Epilepsy in his mother; Heart disease in his brother and mother; Lung cancer in his paternal uncle.   SOCIAL HISTORY:  reports that he has been smoking cigarettes. He has been smoking about 0.10 packs per day. He has quit using smokeless tobacco.  His smokeless tobacco use included chew. He reports that he does not drink alcohol and does not use drugs.   ALLERGIES: Penicillins   MEDICATIONS:  Current Outpatient Medications  Medication Sig Dispense Refill  . albuterol (PROVENTIL HFA;VENTOLIN HFA) 108 (90 Base) MCG/ACT inhaler Inhale 2 puffs into the lungs every 6 (six) hours as needed for wheezing or shortness of breath. 1 Inhaler 0  . cyclobenzaprine (FLEXERIL) 10 MG tablet Take 10 mg by mouth 2 (two) times daily as needed for muscle spasms.    Marland Kitchen dexamethasone (DECADRON) 4 MG tablet Take 1 tablet (4 mg total)  by mouth 2 (two) times daily with a meal. 60 tablet 0  . docusate sodium (COLACE) 100 MG capsule Take 100 mg by mouth daily.    . fluticasone furoate-vilanterol (BREO ELLIPTA) 100-25 MCG/INH AEPB Inhale 1 puff into the lungs daily. 30 each 3  . ibuprofen (ADVIL) 800 MG tablet Take 800 mg by mouth 3 (three) times daily.    Marland Kitchen levothyroxine (SYNTHROID) 75 MCG tablet  Take 75 mcg by mouth daily.    Marland Kitchen omeprazole (PRILOSEC) 40 MG capsule Take 1 capsule (40 mg total) by mouth daily. 90 capsule 4  . ondansetron (ZOFRAN) 4 MG tablet Take 4 mg by mouth every 8 (eight) hours as needed.    . polyethylene glycol powder (MIRALAX) powder Take 17 g by mouth daily. 255 g 11  . TRULANCE 3 MG TABS Take 1 tablet by mouth daily.     No current facility-administered medications for this encounter.     REVIEW OF SYSTEMS:  A 15 point review of systems is documented in the electronic medical record. This was obtained by the nursing staff. However, I reviewed this with the patient to discuss relevant findings and make appropriate changes.  Pertinent items are noted in HPI.    PHYSICAL EXAM:  height is 5\' 8"  (1.727 m) and weight is 184 lb (83.5 kg).   ECOG = 1  0 - Asymptomatic (Fully active, able to carry on all predisease activities without restriction)  1 - Symptomatic but completely ambulatory (Restricted in physically strenuous activity but ambulatory and able to carry out work of a light or sedentary nature. For example, light housework, office work)  2 - Symptomatic, <50% in bed during the day (Ambulatory and capable of all self care but unable to carry out any work activities. Up and about more than 50% of waking hours)  3 - Symptomatic, >50% in bed, but not bedbound (Capable of only limited self-care, confined to bed or chair 50% or more of waking hours)  4 - Bedbound (Completely disabled. Cannot carry on any self-care. Totally confined to bed or chair)  5 - Death   Eustace Pen MM, Creech RH, Tormey DC, et al. [phone]). "Toxicity and response criteria of the [facility]". [place] Oncol. 5 (6): 649-55  Alert and oriented x3   LABORATORY DATA:  Lab Results  Component Value Date   WBC 12.1 (H) 10/18/2019   HGB 13.2 10/18/2019   HCT 42.5 10/18/2019   MCV 91.4 10/18/2019   PLT 372 10/18/2019   Lab Results  Component Value Date   NA 134  (L) 10/18/2019   K 3.9 10/18/2019   CL 95 (L) 10/18/2019   CO2 28 10/18/2019   Lab Results  Component Value Date   ALT 32 10/18/2019   AST 25 10/18/2019   ALKPHOS 90 10/18/2019   BILITOT 0.7 10/18/2019      RADIOGRAPHY: CT Head Wo Contrast  Result Date: 10/18/2019 CLINICAL DATA:  Acute headache.  Dizziness.  Normal neuro exam EXAM: CT HEAD WITHOUT CONTRAST CT CERVICAL SPINE WITHOUT CONTRAST TECHNIQUE: Multidetector CT imaging of the head and cervical spine was performed following the standard protocol without intravenous contrast. Multiplanar CT image reconstructions of the cervical spine were also generated. COMPARISON:  None. FINDINGS: CT HEAD FINDINGS Brain: Large area of low-density in the left cerebellum measuring 3 cm. The low-density is central with sparing of the folia. There is mass-effect on the fourth ventricle. No obstructive hydrocephalus. Ventricle size normal. Both cerebral hemispheres normal.  No acute hemorrhage. Vascular: Negative for hyperdense vessel Skull: Negative Sinuses/Orbits: Mucosal edema right maxillary sinus otherwise clear sinuses. Negative orbit. Other: None CT CERVICAL SPINE FINDINGS Alignment: Normal alignment with straightening of the cervical lordosis. Skull base and vertebrae: Negative for fracture or mass Soft tissues and spinal canal: No soft tissue mass or swelling. Disc levels: Mild disc degeneration and spurring at C3-4 and C4-5. Moderate disc degeneration and spurring at C5-6 with mild left foraminal stenosis. Moderate disc degeneration and spurring C6-7 with moderate foraminal stenosis bilaterally left greater than right and mild spinal stenosis. Upper chest: Lung apices clear bilaterally. Other: None IMPRESSION: 1. Large low-density lesion in the left cerebellum measuring approximately 3 cm. Favor tumor. Infarct considered less likely. No obstructive hydrocephalus. Recommend MRI brain without with contrast 2. Cervical spondylosis as above. 3. These results  were called by telephone at the time of interpretation on 10/18/2019 at 10:23 am to provider [facility] , who verbally acknowledged these results. Electronically Signed   By: Franchot Gallo M.D.   On: 10/18/2019 10:23   CT Chest W Contrast  Result Date: 10/18/2019 CLINICAL DATA:  Intermittent dizziness with headaches for 2 weeks. Rim enhancing left cerebellar mass. Evaluate for metastatic disease. EXAM: CT CHEST, ABDOMEN, AND PELVIS WITH CONTRAST TECHNIQUE: Multidetector CT imaging of the chest, abdomen and pelvis was performed following the standard protocol during bolus administration of intravenous contrast. CONTRAST:  171mL OMNIPAQUE IOHEXOL 300 MG/ML  SOLN COMPARISON:  MR head 10/18/2019. Chest radiographs 03/10/2017 and abdominopelvic CT 06/15/2016. FINDINGS: CT CHEST FINDINGS Cardiovascular: Mild atherosclerosis of the aorta, great vessels and coronary arteries. No evidence of acute pulmonary embolism or other acute vascular findings. The heart size is normal. There is a small amount of pericardial fluid versus thickening anteriorly. Mediastinum/Nodes: There are no enlarged mediastinal, hilar or axillary lymph nodes. There is a small subcarinal node measuring 9 mm short axis on image 38/2. The thyroid gland, trachea and esophagus appear normal. Lungs/Pleura: No pleural effusion or pneumothorax. Mild centrilobular and paraseptal emphysema. There is a large right lower lobe infrahilar mass with heterogeneous enhancement highly suspicious for bronchogenic carcinoma. This measures up to 6.5 x 5.3 cm on image 50/2. This mass abuts the IVC, although does not invade it. The mass also abuts the right hemidiaphragm medially. There is anterior tenting of the right major fissure inferiorly without definite invasion of the right middle lobe. No other suspicious pulmonary nodules. Musculoskeletal/Chest wall: No chest wall mass or suspicious osseous findings. A hemangiomas noted in the right aspect of the T3  vertebral body. CT ABDOMEN AND PELVIS FINDINGS Hepatobiliary: 1.7 x 1.2 cm low-density lesion in the left hepatic lobe on image 64/2 is unchanged from the 2018 CT, consistent with a benign finding. No new or enlarging hepatic lesions. No evidence of gallstones, gallbladder wall thickening or biliary dilatation. Pancreas: Unremarkable. No pancreatic ductal dilatation or surrounding inflammatory changes. Spleen: Normal in size without focal abnormality. Adrenals/Urinary Tract: Both adrenal glands appear normal. The kidneys appear normal without evidence of urinary tract calculus, suspicious lesion or hydronephrosis. No bladder abnormalities are seen. Stomach/Bowel: No evidence of bowel wall thickening, distention or surrounding inflammatory change. Mild gastric wall thickening appears similar to the previous study. The appendix appears normal. Vascular/Lymphatic: There are no enlarged abdominal or pelvic lymph nodes. Aortic and branch vessel atherosclerosis. Retroaortic left renal vein. Reproductive: The prostate gland and seminal vesicles appear normal. Other: Intact abdominal wall.  No ascites or peritoneal nodularity. Musculoskeletal: No acute or significant osseous findings.  IMPRESSION: 1. Large right lower lobe infrahilar lung mass highly suspicious for bronchogenic carcinoma. This mass tents the right major fissure inferiorly in protrudes into the right middle lobe. 2. No definite metastatic disease within the chest, abdomen or pelvis. 3. Stable chronic left hepatic lobe lesion, consistent with a benign finding. 4. Aortic Atherosclerosis (ICD10-I70.0) and Emphysema (ICD10-J43.9). Electronically Signed   By: Richardean Sale M.D.   On: 10/18/2019 15:38   CT Cervical Spine Wo Contrast  Result Date: 10/18/2019 CLINICAL DATA:  Acute headache.  Dizziness.  Normal neuro exam EXAM: CT HEAD WITHOUT CONTRAST CT CERVICAL SPINE WITHOUT CONTRAST TECHNIQUE: Multidetector CT imaging of the head and cervical spine was  performed following the standard protocol without intravenous contrast. Multiplanar CT image reconstructions of the cervical spine were also generated. COMPARISON:  None. FINDINGS: CT HEAD FINDINGS Brain: Large area of low-density in the left cerebellum measuring 3 cm. The low-density is central with sparing of the folia. There is mass-effect on the fourth ventricle. No obstructive hydrocephalus. Ventricle size normal. Both cerebral hemispheres normal. No acute hemorrhage. Vascular: Negative for hyperdense vessel Skull: Negative Sinuses/Orbits: Mucosal edema right maxillary sinus otherwise clear sinuses. Negative orbit. Other: None CT CERVICAL SPINE FINDINGS Alignment: Normal alignment with straightening of the cervical lordosis. Skull base and vertebrae: Negative for fracture or mass Soft tissues and spinal canal: No soft tissue mass or swelling. Disc levels: Mild disc degeneration and spurring at C3-4 and C4-5. Moderate disc degeneration and spurring at C5-6 with mild left foraminal stenosis. Moderate disc degeneration and spurring C6-7 with moderate foraminal stenosis bilaterally left greater than right and mild spinal stenosis. Upper chest: Lung apices clear bilaterally. Other: None IMPRESSION: 1. Large low-density lesion in the left cerebellum measuring approximately 3 cm. Favor tumor. Infarct considered less likely. No obstructive hydrocephalus. Recommend MRI brain without with contrast 2. Cervical spondylosis as above. 3. These results were called by telephone at the time of interpretation on 10/18/2019 at 10:23 am to provider [facility] , who verbally acknowledged these results. Electronically Signed   By: Franchot Gallo M.D.   On: 10/18/2019 10:23   MR Brain W and Wo Contrast  Result Date: 10/18/2019 CLINICAL DATA:  Dizziness.  Brain lesion on CT. EXAM: MRI HEAD WITHOUT AND WITH CONTRAST TECHNIQUE: Multiplanar, multiecho pulse sequences of the brain and surrounding structures were obtained without and  with intravenous contrast. CONTRAST:  45mL GADAVIST GADOBUTROL 1 MMOL/ML IV SOLN COMPARISON:  CT head 10/18/2019 FINDINGS: Brain: Left cerebellar mass lesion demonstrates rim enhancement and central necrosis. There is a moderate to large amount of surrounding white matter edema with mass-effect on the fourth ventricle. No obstruction of the ventricles. The wall of the mass does show some mild restricted diffusion but the central necrotic area does not. No other mass lesions identified. Small developmental venous anomaly in the right frontal lobe. Ventricle size is normal. Negative for acute infarct. Mild chronic microvascular ischemic change in the white matter. Vascular: Normal arterial flow voids Skull and upper cervical spine: No focal skeletal lesion. Sinuses/Orbits: Mild mucosal edema paranasal sinuses. Negative orbit. Other: None IMPRESSION: 2 cm rim enhancing mass left cerebellar with surrounding white matter edema. No second lesion identified. This is most consistent with metastatic disease and workup for malignancy is recommended. Brain abscess considered less likely due to lack of central restricted diffusion and location. Electronically Signed   By: Franchot Gallo M.D.   On: 10/18/2019 12:26   CT Abdomen Pelvis W Contrast  Result Date: 10/18/2019 CLINICAL  DATA:  Intermittent dizziness with headaches for 2 weeks. Rim enhancing left cerebellar mass. Evaluate for metastatic disease. EXAM: CT CHEST, ABDOMEN, AND PELVIS WITH CONTRAST TECHNIQUE: Multidetector CT imaging of the chest, abdomen and pelvis was performed following the standard protocol during bolus administration of intravenous contrast. CONTRAST:  115mL OMNIPAQUE IOHEXOL 300 MG/ML  SOLN COMPARISON:  MR head 10/18/2019. Chest radiographs 03/10/2017 and abdominopelvic CT 06/15/2016. FINDINGS: CT CHEST FINDINGS Cardiovascular: Mild atherosclerosis of the aorta, great vessels and coronary arteries. No evidence of acute pulmonary embolism or other  acute vascular findings. The heart size is normal. There is a small amount of pericardial fluid versus thickening anteriorly. Mediastinum/Nodes: There are no enlarged mediastinal, hilar or axillary lymph nodes. There is a small subcarinal node measuring 9 mm short axis on image 38/2. The thyroid gland, trachea and esophagus appear normal. Lungs/Pleura: No pleural effusion or pneumothorax. Mild centrilobular and paraseptal emphysema. There is a large right lower lobe infrahilar mass with heterogeneous enhancement highly suspicious for bronchogenic carcinoma. This measures up to 6.5 x 5.3 cm on image 50/2. This mass abuts the IVC, although does not invade it. The mass also abuts the right hemidiaphragm medially. There is anterior tenting of the right major fissure inferiorly without definite invasion of the right middle lobe. No other suspicious pulmonary nodules. Musculoskeletal/Chest wall: No chest wall mass or suspicious osseous findings. A hemangiomas noted in the right aspect of the T3 vertebral body. CT ABDOMEN AND PELVIS FINDINGS Hepatobiliary: 1.7 x 1.2 cm low-density lesion in the left hepatic lobe on image 64/2 is unchanged from the 2018 CT, consistent with a benign finding. No new or enlarging hepatic lesions. No evidence of gallstones, gallbladder wall thickening or biliary dilatation. Pancreas: Unremarkable. No pancreatic ductal dilatation or surrounding inflammatory changes. Spleen: Normal in size without focal abnormality. Adrenals/Urinary Tract: Both adrenal glands appear normal. The kidneys appear normal without evidence of urinary tract calculus, suspicious lesion or hydronephrosis. No bladder abnormalities are seen. Stomach/Bowel: No evidence of bowel wall thickening, distention or surrounding inflammatory change. Mild gastric wall thickening appears similar to the previous study. The appendix appears normal. Vascular/Lymphatic: There are no enlarged abdominal or pelvic lymph nodes. Aortic and  branch vessel atherosclerosis. Retroaortic left renal vein. Reproductive: The prostate gland and seminal vesicles appear normal. Other: Intact abdominal wall.  No ascites or peritoneal nodularity. Musculoskeletal: No acute or significant osseous findings. IMPRESSION: 1. Large right lower lobe infrahilar lung mass highly suspicious for bronchogenic carcinoma. This mass tents the right major fissure inferiorly in protrudes into the right middle lobe. 2. No definite metastatic disease within the chest, abdomen or pelvis. 3. Stable chronic left hepatic lobe lesion, consistent with a benign finding. 4. Aortic Atherosclerosis (ICD10-I70.0) and Emphysema (ICD10-J43.9). Electronically Signed   By: Richardean Sale M.D.   On: 10/18/2019 15:38   DG Chest Port 1 View  Result Date: 10/24/2019 CLINICAL DATA:  Status post bronchoscopy. EXAM: PORTABLE CHEST 1 VIEW COMPARISON:  PA and lateral chest 03/10/2017. CT chest, abdomen and pelvis 10/18/2019. FINDINGS: No pneumothorax. The lungs are emphysematous. Opacity in the medial aspect of the right lung base correlates with the mass seen on the prior CT. Heart size is normal. IMPRESSION: Negative for pneumothorax after bronchoscopy. Right lower lobe mass as seen on prior CT. Atherosclerosis. Electronically Signed   By: Inge Rise M.D.   On: 10/24/2019 15:26   DG C-ARM BRONCHOSCOPY  Result Date: 10/24/2019 C-ARM BRONCHOSCOPY: Fluoroscopy was utilized by the requesting physician.  No radiographic interpretation.  IMPRESSION/ PLAN:  The patient is presenting with what appears to represent stage IV lung cancer with a 6.5 cm tumor in the right lung and a 2 cm left cerebellar metastasis.  Pathologic confirmation has not been achieved yet and the patient is scheduled to undergo an additional bronchoscopy in the near future.  I discussed with the patient the likely recommendation to proceed with a course of radiosurgery for the brain metastasis, pending final pathology.   He also likely is a good candidate for thoracic radiation treatment as well.  If no additional disease is seen on PET scan with a solitary metastasis is the only site of metastatic disease, then the patient may be a reasonable candidate for a longer/definitive course of thoracic radiation treatment.  We will follow-up on this after additional information is obtained and tentatively he will proceed with a 3T MRI scan for planning for a possible course of radiosurgery.  All of this was discussed with the patient and he does wish to proceed with this overall treatment plan.  He also is being scheduled to be seen by medical oncology as well.    Due to the coronavirus pandemic, this encounter was provided by telephone.  The patient has given verbal consent for this type of encounter and has been advised to only accept a meeting of this type in a secure network environment. The time spent during this encounter was 45 minutes today,  including review of information today, discussion with patient, and coordination of care. The attendants for this meeting include  Dr. Lisbeth Renshaw and the patient.  During the encounter,  Dr. Lisbeth Renshaw, and was located at [facility] Radiation Oncology Department.  The patient was located at home.         ________________________________   Jodelle Gross, MD, PhD   **Disclaimer: This note was dictated with voice recognition software. Similar sounding words can inadvertently be transcribed and this note may contain transcription errors which may not have been corrected upon publication of note.**

## 2019-11-04 ENCOUNTER — Ambulatory Visit (HOSPITAL_COMMUNITY): Payer: Managed Care, Other (non HMO)

## 2019-11-04 ENCOUNTER — Telehealth: Payer: Self-pay | Admitting: Emergency Medicine

## 2019-11-04 NOTE — Anesthesia Preprocedure Evaluation (Addendum)
Anesthesia Evaluation  Patient identified by MRN, date of birth, ID band Patient awake    Reviewed: Allergy & Precautions, NPO status , Patient's Chart, lab work & pertinent test results  Airway Mallampati: II  TM Distance: >3 FB Neck ROM: Full    Dental  (+) Dental Advisory Given, Missing, Poor Dentition,    Pulmonary Current SmokerPatient did not abstain from smoking.,  Lung mass    Pulmonary exam normal breath sounds clear to auscultation       Cardiovascular hypertension, Normal cardiovascular exam Rhythm:Regular Rate:Normal     Neuro/Psych negative neurological ROS  negative psych ROS   GI/Hepatic Neg liver ROS, GERD  Medicated and Controlled,  Endo/Other  Hypothyroidism   Renal/GU negative Renal ROS     Musculoskeletal negative musculoskeletal ROS (+)   Abdominal   Peds  Hematology negative hematology ROS (+)   Anesthesia Other Findings   Reproductive/Obstetrics                            Anesthesia Physical Anesthesia Plan  ASA: III  Anesthesia Plan: General   Post-op Pain Management:    Induction: Intravenous  PONV Risk Score and Plan: 1 and Ondansetron and Dexamethasone  Airway Management Planned: Oral ETT  Additional Equipment:   Intra-op Plan:   Post-operative Plan: Extubation in OR  Informed Consent: I have reviewed the patients History and Physical, chart, labs and discussed the procedure including the risks, benefits and alternatives for the proposed anesthesia with the patient or authorized representative who has indicated his/her understanding and acceptance.     Dental advisory given  Plan Discussed with: CRNA  Anesthesia Plan Comments:        Anesthesia Quick Evaluation

## 2019-11-04 NOTE — Progress Notes (Signed)
[place] [phone]   [phone]  CONSULT NOTE  REFERRING PHYSICIAN: Dr. Lamonte Sakai   REASON FOR CONSULTATION:  Stage IV Carcinoma, likely lung primary   [name] is a 59 y.o. male with a past medical history significant for tobacco use, essential hypertension, COPD, IBS, diverticulitis, GERD, hypothyroidism, and hyperlipidemia is referred to the clinic for evaluation of metastatic carcinoma, likely bronchogenic carcinoma.    The patient's evaluation began after he presented to the emergency room at [facility] on 10/18/2019 for the chief complaint of several weeks of dizziness, headaches, nausea, vomiting, and gait changes drifting to one side. The patient had a CT scan of the head performed which noted a 2 cm left cerebellar letter mass with associated edema.  The patient was started on 4 mg of Decadron twice daily per recommendations of neurosurgery. He has since tapered that to 2 mg BID. The patient also then had a CT scan of the chest, abdomen, and pelvis which noted a large right lower lobe/infrahilar mass 5.5 cm.  There is no evidence of metastatic disease in the abdomen and pelvis.  The patient was then seen by pulmonology on 10/24/2019 for a bronchoscopy.  Cytology was negative and he had a repeat bronchoscopy yesterday on 11/05/2019.  The pathology is still pending.   The patient also saw Dr. Lisbeth Renshaw and radiation oncology on 10/31/2019 and is planning to undergo SRS to the brain lesion on 11/14/2019.  Today, the patient is feeling "pretty good" except he occasionally feels "woozy".  Overall, his symptoms have improved greatly since taking the decadron.  The patient no longer has headaches, nausea, or vomiting.  He denies any fever, chills, night sweats, or weight loss.  He denies any chest pain or hemoptysis.  He reports a subtle cough after having his bronchoscopy yesterday.  He reports very mild shortness of breath on exertion which  is "not that bad".  He denies any diarrhea or constipation.   The patient's family history consist of a mother who had a myocardial infarction.  The patient's father has COPD and is currently on oxygen.  The patient has a brother with no medical problems.  The patient is married and has 2 children from previous marriage.  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  He no longer drinks alcohol and has not done so for several years.  The patient has been smoking for approximately 46 years 2-2.5 packs of cigarettes per day.  He is currently smoking about 1-1/2 packs/day.  He denies any history of drug use. HPI  Past Medical History:  Diagnosis Date  . Diverticulosis   . GERD (gastroesophageal reflux disease)   . Hx of small bowel obstruction   . Hyperlipidemia   . Hypothyroidism   . IBS (irritable bowel syndrome)   . Substance abuse (HCC)    Alcoholic, Drug addition  . Thyroid disease     Past Surgical History:  Procedure Laterality Date  . arm surgery Right   . BRONCHIAL BRUSHINGS  10/24/2019   Procedure: BRONCHIAL BRUSHINGS;  Surgeon: Collene Gobble, MD;  Location: [facility] ENDOSCOPY;  Service: Cardiopulmonary;;  right lower lobe   . BRONCHIAL NEEDLE ASPIRATION BIOPSY  10/24/2019   Procedure: BRONCHIAL NEEDLE ASPIRATION BIOPSIES;  Surgeon: Collene Gobble, MD;  Location: [place];  Service: Cardiopulmonary;;  . ENDOBRONCHIAL ULTRASOUND N/A 10/24/2019   Procedure: ENDOBRONCHIAL ULTRASOUND;  Surgeon: Collene Gobble, MD;  Location: [place];  Service: Cardiopulmonary;  Laterality: N/A;  . FINGER SURGERY Right    Middle  . VIDEO BRONCHOSCOPY N/A 10/24/2019   Procedure: VIDEO BRONCHOSCOPY WITHOUT FLUORO;  Surgeon: Collene Gobble, MD;  Location: [facility] ENDOSCOPY;  Service: Cardiopulmonary;  Laterality: N/A;    Family History  Problem Relation Age of Onset  . Epilepsy Mother   . Heart disease Mother   . Heart disease Brother   . Lung cancer Paternal Uncle     Social  History Social History   Tobacco Use  . Smoking status: Current Every Day Smoker    Packs/day: 1.50    Types: Cigarettes  . Smokeless tobacco: Former Systems developer    Types: Secondary school teacher  . Vaping Use: Never used  Substance Use Topics  . Alcohol use: No    Comment: Alcoholic clean for 6 years  . Drug use: No    Comment: Recovering Drug Addict-clean for 6 years    Allergies  Allergen Reactions  . Penicillins Other (See Comments)    Childhood allergy.  Has patient had a PCN reaction causing immediate rash, facial/tongue/throat swelling, SOB or lightheadedness with hypotension: unknown Has patient had a PCN reaction causing severe rash involving mucus membranes or skin necrosis: unknown Has patient had a PCN reaction that required hospitalization: unknown Has patient had a PCN reaction occurring within the last 10 years: no If all of the above answers are "NO", then may proceed with Cephalosporin use.     Current Outpatient Medications  Medication Sig Dispense Refill  . albuterol (PROVENTIL HFA;VENTOLIN HFA) 108 (90 Base) MCG/ACT inhaler Inhale 2 puffs into the lungs every 6 (six) hours as needed for wheezing or shortness of breath. 1 Inhaler 0  . cyclobenzaprine (FLEXERIL) 10 MG tablet Take 10 mg by mouth 2 (two) times daily as needed for muscle spasms.    Marland Kitchen dexamethasone (DECADRON) 4 MG tablet Take 1 tablet (4 mg total) by mouth 2 (two) times daily with a meal. 60 tablet 0  . docusate sodium (COLACE) 100 MG capsule Take 100 mg by mouth daily.    . fluticasone furoate-vilanterol (BREO ELLIPTA) 100-25 MCG/INH AEPB Inhale 1 puff into the lungs daily. 30 each 3  . ibuprofen (ADVIL) 800 MG tablet Take 800 mg by mouth 3 (three) times daily.    Marland Kitchen levothyroxine (SYNTHROID) 75 MCG tablet Take 75 mcg by mouth daily.    Marland Kitchen omeprazole (PRILOSEC) 40 MG capsule Take 1 capsule (40 mg total) by mouth daily. 90 capsule 4  . ondansetron (ZOFRAN) 4 MG tablet Take 4 mg by mouth every 8 (eight) hours as  needed.    . polyethylene glycol powder (MIRALAX) powder Take 17 g by mouth daily. 255 g 11  . TRULANCE 3 MG TABS Take 1 tablet by mouth daily.     No current facility-administered medications for this visit.    Review of Systems REVIEW OF SYSTEMS:   Review of Systems  Constitutional: Negative for appetite change, chills, fatigue, fever and unexpected weight change.  HENT: Negative for mouth sores, nosebleeds, sore throat and trouble swallowing.   Eyes: Negative for eye problems and icterus.  Respiratory: positive for mild shortness of breath and cough secondary to bronchoscopy. Negative for hemoptysis and wheezing.   Cardiovascular: Negative for chest pain and leg swelling.  Gastrointestinal: Negative for abdominal pain, constipation, diarrhea, nausea and vomiting.  Genitourinary: Negative for bladder incontinence, difficulty urinating, dysuria, frequency and hematuria.   Musculoskeletal: Negative for back pain, gait problem,and neck stiffness.  Skin: Negative for itching and rash.  Neurological: Positive for improved but persistent dizziness. Negative for extremity weakness, gait problem, headaches (improved), light-headedness and seizures.  Hematological: Negative for adenopathy. Does not bruise/bleed easily.  Psychiatric/Behavioral: Negative for confusion, depression and sleep disturbance. The patient is not nervous/anxious.     PHYSICAL EXAMINATION:  Blood pressure 132/77, pulse 69, temperature (!) 97.3 F (36.3 C), temperature source Temporal, resp. rate 17, height '5\' 8"'$  (1.727 m), weight 178 lb 4.8 oz (80.9 kg), SpO2 100 %.  ECOG PERFORMANCE STATUS: 1  Physical Exam  Constitutional: Oriented to person, place, and time and well-developed, well-nourished, and in no distress.  HENT:  Head: Normocephalic and atraumatic.  Mouth/Throat: Oropharynx is clear and moist. No oropharyngeal exudate.  Eyes: Conjunctivae are normal. Right eye exhibits no discharge. Left eye exhibits no  discharge. No scleral icterus.  Neck: Normal range of motion. Neck supple.  Cardiovascular: Normal rate, regular rhythm, normal heart sounds and intact distal pulses.   Pulmonary/Chest: Effort normal and breath sounds normal except for mild wheezing in the right lower lobe. No respiratory distress. No wheezes. No rales.  Abdominal: Soft. Bowel sounds are normal. Exhibits no distension and no mass. There is no tenderness.  Musculoskeletal: Normal range of motion. Exhibits no edema.  Lymphadenopathy:    No cervical adenopathy.  Neurological: Alert and oriented to person, place, and time. Exhibits normal muscle tone. Examined in the wheelchair Skin: Skin is warm and dry. No rash noted. Not diaphoretic. No erythema. No pallor.  Psychiatric: Mood, memory and judgment normal.  Vitals reviewed.  LABORATORY DATA: Lab Results  Component Value Date   WBC 19.7 (H) 11/06/2019   HGB 13.6 11/06/2019   HCT 42.5 11/06/2019   MCV 88.0 11/06/2019   PLT 318 11/06/2019      Chemistry      Component Value Date/Time   NA 137 11/06/2019 1245   NA 141 03/08/2017 1707   K 5.2 (H) 11/06/2019 1245   CL 100 11/06/2019 1245   CO2 26 11/06/2019 1245   BUN 17 11/06/2019 1245   BUN 12 03/08/2017 1707   CREATININE 0.91 11/06/2019 1245      Component Value Date/Time   CALCIUM 9.6 11/06/2019 1245   ALKPHOS 81 11/06/2019 1245   AST 17 11/06/2019 1245   ALT 27 11/06/2019 1245   BILITOT 0.3 11/06/2019 1245       RADIOGRAPHIC STUDIES: CT Head Wo Contrast  Result Date: 10/18/2019 CLINICAL DATA:  Acute headache.  Dizziness.  Normal neuro exam EXAM: CT HEAD WITHOUT CONTRAST CT CERVICAL SPINE WITHOUT CONTRAST TECHNIQUE: Multidetector CT imaging of the head and cervical spine was performed following the standard protocol without intravenous contrast. Multiplanar CT image reconstructions of the cervical spine were also generated. COMPARISON:  None. FINDINGS: CT HEAD FINDINGS Brain: Large area of low-density in  the left cerebellum measuring 3 cm. The low-density is central with sparing of the folia. There is mass-effect on the fourth ventricle. No obstructive hydrocephalus. Ventricle size normal. Both cerebral hemispheres normal. No acute hemorrhage. Vascular: Negative for hyperdense vessel Skull: Negative Sinuses/Orbits: Mucosal edema right maxillary sinus otherwise clear sinuses. Negative orbit. Other: None CT CERVICAL SPINE FINDINGS Alignment: Normal alignment with straightening of the cervical lordosis. Skull base and vertebrae: Negative for fracture or mass Soft tissues and spinal canal: No soft tissue mass or swelling. Disc levels: Mild disc degeneration and spurring at C3-4 and C4-5. Moderate disc degeneration and spurring at C5-6 with mild left foraminal stenosis. Moderate disc degeneration  and spurring C6-7 with moderate foraminal stenosis bilaterally left greater than right and mild spinal stenosis. Upper chest: Lung apices clear bilaterally. Other: None IMPRESSION: 1. Large low-density lesion in the left cerebellum measuring approximately 3 cm. Favor tumor. Infarct considered less likely. No obstructive hydrocephalus. Recommend MRI brain without with contrast 2. Cervical spondylosis as above. 3. These results were called by telephone at the time of interpretation on 10/18/2019 at 10:23 am to provider [facility] , who verbally acknowledged these results. Electronically Signed   By: Franchot Gallo M.D.   On: 10/18/2019 10:23   CT Chest W Contrast  Result Date: 10/18/2019 CLINICAL DATA:  Intermittent dizziness with headaches for 2 weeks. Rim enhancing left cerebellar mass. Evaluate for metastatic disease. EXAM: CT CHEST, ABDOMEN, AND PELVIS WITH CONTRAST TECHNIQUE: Multidetector CT imaging of the chest, abdomen and pelvis was performed following the standard protocol during bolus administration of intravenous contrast. CONTRAST:  116m OMNIPAQUE IOHEXOL 300 MG/ML  SOLN COMPARISON:  MR head 10/18/2019. Chest  radiographs 03/10/2017 and abdominopelvic CT 06/15/2016. FINDINGS: CT CHEST FINDINGS Cardiovascular: Mild atherosclerosis of the aorta, great vessels and coronary arteries. No evidence of acute pulmonary embolism or other acute vascular findings. The heart size is normal. There is a small amount of pericardial fluid versus thickening anteriorly. Mediastinum/Nodes: There are no enlarged mediastinal, hilar or axillary lymph nodes. There is a small subcarinal node measuring 9 mm short axis on image 38/2. The thyroid gland, trachea and esophagus appear normal. Lungs/Pleura: No pleural effusion or pneumothorax. Mild centrilobular and paraseptal emphysema. There is a large right lower lobe infrahilar mass with heterogeneous enhancement highly suspicious for bronchogenic carcinoma. This measures up to 6.5 x 5.3 cm on image 50/2. This mass abuts the IVC, although does not invade it. The mass also abuts the right hemidiaphragm medially. There is anterior tenting of the right major fissure inferiorly without definite invasion of the right middle lobe. No other suspicious pulmonary nodules. Musculoskeletal/Chest wall: No chest wall mass or suspicious osseous findings. A hemangiomas noted in the right aspect of the T3 vertebral body. CT ABDOMEN AND PELVIS FINDINGS Hepatobiliary: 1.7 x 1.2 cm low-density lesion in the left hepatic lobe on image 64/2 is unchanged from the 2018 CT, consistent with a benign finding. No new or enlarging hepatic lesions. No evidence of gallstones, gallbladder wall thickening or biliary dilatation. Pancreas: Unremarkable. No pancreatic ductal dilatation or surrounding inflammatory changes. Spleen: Normal in size without focal abnormality. Adrenals/Urinary Tract: Both adrenal glands appear normal. The kidneys appear normal without evidence of urinary tract calculus, suspicious lesion or hydronephrosis. No bladder abnormalities are seen. Stomach/Bowel: No evidence of bowel wall thickening, distention or  surrounding inflammatory change. Mild gastric wall thickening appears similar to the previous study. The appendix appears normal. Vascular/Lymphatic: There are no enlarged abdominal or pelvic lymph nodes. Aortic and branch vessel atherosclerosis. Retroaortic left renal vein. Reproductive: The prostate gland and seminal vesicles appear normal. Other: Intact abdominal wall.  No ascites or peritoneal nodularity. Musculoskeletal: No acute or significant osseous findings. IMPRESSION: 1. Large right lower lobe infrahilar lung mass highly suspicious for bronchogenic carcinoma. This mass tents the right major fissure inferiorly in protrudes into the right middle lobe. 2. No definite metastatic disease within the chest, abdomen or pelvis. 3. Stable chronic left hepatic lobe lesion, consistent with a benign finding. 4. Aortic Atherosclerosis (ICD10-I70.0) and Emphysema (ICD10-J43.9). Electronically Signed   By: WRichardean SaleM.D.   On: 10/18/2019 15:38   CT Cervical Spine Wo Contrast  Result  Date: 10/18/2019 CLINICAL DATA:  Acute headache.  Dizziness.  Normal neuro exam EXAM: CT HEAD WITHOUT CONTRAST CT CERVICAL SPINE WITHOUT CONTRAST TECHNIQUE: Multidetector CT imaging of the head and cervical spine was performed following the standard protocol without intravenous contrast. Multiplanar CT image reconstructions of the cervical spine were also generated. COMPARISON:  None. FINDINGS: CT HEAD FINDINGS Brain: Large area of low-density in the left cerebellum measuring 3 cm. The low-density is central with sparing of the folia. There is mass-effect on the fourth ventricle. No obstructive hydrocephalus. Ventricle size normal. Both cerebral hemispheres normal. No acute hemorrhage. Vascular: Negative for hyperdense vessel Skull: Negative Sinuses/Orbits: Mucosal edema right maxillary sinus otherwise clear sinuses. Negative orbit. Other: None CT CERVICAL SPINE FINDINGS Alignment: Normal alignment with straightening of the cervical  lordosis. Skull base and vertebrae: Negative for fracture or mass Soft tissues and spinal canal: No soft tissue mass or swelling. Disc levels: Mild disc degeneration and spurring at C3-4 and C4-5. Moderate disc degeneration and spurring at C5-6 with mild left foraminal stenosis. Moderate disc degeneration and spurring C6-7 with moderate foraminal stenosis bilaterally left greater than right and mild spinal stenosis. Upper chest: Lung apices clear bilaterally. Other: None IMPRESSION: 1. Large low-density lesion in the left cerebellum measuring approximately 3 cm. Favor tumor. Infarct considered less likely. No obstructive hydrocephalus. Recommend MRI brain without with contrast 2. Cervical spondylosis as above. 3. These results were called by telephone at the time of interpretation on 10/18/2019 at 10:23 am to provider [facility] , who verbally acknowledged these results. Electronically Signed   By: Franchot Gallo M.D.   On: 10/18/2019 10:23   MR Brain W and Wo Contrast  Result Date: 10/18/2019 CLINICAL DATA:  Dizziness.  Brain lesion on CT. EXAM: MRI HEAD WITHOUT AND WITH CONTRAST TECHNIQUE: Multiplanar, multiecho pulse sequences of the brain and surrounding structures were obtained without and with intravenous contrast. CONTRAST:  61m GADAVIST GADOBUTROL 1 MMOL/ML IV SOLN COMPARISON:  CT head 10/18/2019 FINDINGS: Brain: Left cerebellar mass lesion demonstrates rim enhancement and central necrosis. There is a moderate to large amount of surrounding white matter edema with mass-effect on the fourth ventricle. No obstruction of the ventricles. The wall of the mass does show some mild restricted diffusion but the central necrotic area does not. No other mass lesions identified. Small developmental venous anomaly in the right frontal lobe. Ventricle size is normal. Negative for acute infarct. Mild chronic microvascular ischemic change in the white matter. Vascular: Normal arterial flow voids Skull and upper  cervical spine: No focal skeletal lesion. Sinuses/Orbits: Mild mucosal edema paranasal sinuses. Negative orbit. Other: None IMPRESSION: 2 cm rim enhancing mass left cerebellar with surrounding white matter edema. No second lesion identified. This is most consistent with metastatic disease and workup for malignancy is recommended. Brain abscess considered less likely due to lack of central restricted diffusion and location. Electronically Signed   By: CFranchot GalloM.D.   On: 10/18/2019 12:26   CT Abdomen Pelvis W Contrast  Result Date: 10/18/2019 CLINICAL DATA:  Intermittent dizziness with headaches for 2 weeks. Rim enhancing left cerebellar mass. Evaluate for metastatic disease. EXAM: CT CHEST, ABDOMEN, AND PELVIS WITH CONTRAST TECHNIQUE: Multidetector CT imaging of the chest, abdomen and pelvis was performed following the standard protocol during bolus administration of intravenous contrast. CONTRAST:  1075mOMNIPAQUE IOHEXOL 300 MG/ML  SOLN COMPARISON:  MR head 10/18/2019. Chest radiographs 03/10/2017 and abdominopelvic CT 06/15/2016. FINDINGS: CT CHEST FINDINGS Cardiovascular: Mild atherosclerosis of the aorta, great vessels and  coronary arteries. No evidence of acute pulmonary embolism or other acute vascular findings. The heart size is normal. There is a small amount of pericardial fluid versus thickening anteriorly. Mediastinum/Nodes: There are no enlarged mediastinal, hilar or axillary lymph nodes. There is a small subcarinal node measuring 9 mm short axis on image 38/2. The thyroid gland, trachea and esophagus appear normal. Lungs/Pleura: No pleural effusion or pneumothorax. Mild centrilobular and paraseptal emphysema. There is a large right lower lobe infrahilar mass with heterogeneous enhancement highly suspicious for bronchogenic carcinoma. This measures up to 6.5 x 5.3 cm on image 50/2. This mass abuts the IVC, although does not invade it. The mass also abuts the right hemidiaphragm medially. There  is anterior tenting of the right major fissure inferiorly without definite invasion of the right middle lobe. No other suspicious pulmonary nodules. Musculoskeletal/Chest wall: No chest wall mass or suspicious osseous findings. A hemangiomas noted in the right aspect of the T3 vertebral body. CT ABDOMEN AND PELVIS FINDINGS Hepatobiliary: 1.7 x 1.2 cm low-density lesion in the left hepatic lobe on image 64/2 is unchanged from the 2018 CT, consistent with a benign finding. No new or enlarging hepatic lesions. No evidence of gallstones, gallbladder wall thickening or biliary dilatation. Pancreas: Unremarkable. No pancreatic ductal dilatation or surrounding inflammatory changes. Spleen: Normal in size without focal abnormality. Adrenals/Urinary Tract: Both adrenal glands appear normal. The kidneys appear normal without evidence of urinary tract calculus, suspicious lesion or hydronephrosis. No bladder abnormalities are seen. Stomach/Bowel: No evidence of bowel wall thickening, distention or surrounding inflammatory change. Mild gastric wall thickening appears similar to the previous study. The appendix appears normal. Vascular/Lymphatic: There are no enlarged abdominal or pelvic lymph nodes. Aortic and branch vessel atherosclerosis. Retroaortic left renal vein. Reproductive: The prostate gland and seminal vesicles appear normal. Other: Intact abdominal wall.  No ascites or peritoneal nodularity. Musculoskeletal: No acute or significant osseous findings. IMPRESSION: 1. Large right lower lobe infrahilar lung mass highly suspicious for bronchogenic carcinoma. This mass tents the right major fissure inferiorly in protrudes into the right middle lobe. 2. No definite metastatic disease within the chest, abdomen or pelvis. 3. Stable chronic left hepatic lobe lesion, consistent with a benign finding. 4. Aortic Atherosclerosis (ICD10-I70.0) and Emphysema (ICD10-J43.9). Electronically Signed   By: Richardean Sale M.D.   On:  10/18/2019 15:38   DG Chest Port 1 View  Result Date: 11/05/2019 CLINICAL DATA:  Post bronchoscopy EXAM: PORTABLE CHEST 1 VIEW COMPARISON:  10/24/2019 FINDINGS: The heart size and mediastinal contours are within normal limits. Both lungs are clear. The visualized skeletal structures are unremarkable. No pneumothorax. IMPRESSION: No active disease. Electronically Signed   By: Rolm Baptise M.D.   On: 11/05/2019 08:59   DG Chest Port 1 View  Result Date: 10/24/2019 CLINICAL DATA:  Status post bronchoscopy. EXAM: PORTABLE CHEST 1 VIEW COMPARISON:  PA and lateral chest 03/10/2017. CT chest, abdomen and pelvis 10/18/2019. FINDINGS: No pneumothorax. The lungs are emphysematous. Opacity in the medial aspect of the right lung base correlates with the mass seen on the prior CT. Heart size is normal. IMPRESSION: Negative for pneumothorax after bronchoscopy. Right lower lobe mass as seen on prior CT. Atherosclerosis. Electronically Signed   By: Inge Rise M.D.   On: 10/24/2019 15:26   CT Super D Chest Wo Contrast  Result Date: 11/03/2019 CLINICAL DATA:  Preop for bronchoscopic biopsy of a right lower lobe lung mass. Known brain lesion. EXAM: CT CHEST WITHOUT CONTRAST TECHNIQUE: Multidetector CT imaging of the  chest was performed using thin slice collimation for electromagnetic bronchoscopy planning purposes, without intravenous contrast. COMPARISON:  Chest CT 10/18/2019 FINDINGS: Cardiovascular: The heart is normal in size. Small amount of pericardial fluid but no overt effusion. The aorta is normal in caliber. Scattered atherosclerotic calcifications. Scattered three-vessel coronary artery calcifications and aortic valve calcifications. Mediastinum/Nodes: No mediastinal or hilar mass or adenopathy. The esophagus is grossly normal. Lungs/Pleura: Stable appearing 5.5 cm right infrahilar/lower lobe lung mass. No other pulmonary nodules to suggest pulmonary metastatic disease. Stable underlying emphysematous  changes. Upper Abdomen: No significant upper abdominal findings are identified. No obvious hepatic or adrenal gland metastasis. Musculoskeletal: No significant bony findings. IMPRESSION: 1. Stable 5.5 cm right infrahilar/lower lobe lung mass. 2. No findings for pulmonary metastatic disease. 3. Stable emphysematous changes. 4. Emphysema and aortic atherosclerosis. Aortic Atherosclerosis (ICD10-I70.0) and Emphysema (ICD10-J43.9). Electronically Signed   By: Marijo Sanes M.D.   On: 11/03/2019 16:42   DG C-ARM BRONCHOSCOPY  Result Date: 11/05/2019 C-ARM BRONCHOSCOPY: Fluoroscopy was utilized by the requesting physician.  No radiographic interpretation.   DG C-ARM BRONCHOSCOPY  Result Date: 10/24/2019 C-ARM BRONCHOSCOPY: Fluoroscopy was utilized by the requesting physician.  No radiographic interpretation.    ASSESSMENT: This is a very pleasant 59 year old Caucasian male diagnosed with stage IV carcinoma, likely bronchogenic carcinoma pending tissue diagnosis.  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 He was diagnosed in July 2021.     PLAN: The patient was seen with Dr. Julien Nordmann today.  Dr. Julien Nordmann had a lengthy discussion with the patient today about his current condition, recommendations for further work-up, and possible treatment options depending on final pathology. It appears that the there is no evidence of metastatic disease in the abdomen or pelvis. If the PET scan shows no other areas of disease, Dr. Julien Nordmann may discuss treatment with concurrent chemoradiation.   We recommend the patient complete the staging work-up with his staging PET scan which is scheduled for 11/13/19.    His pathology is pending. If his pathology is consistent with adenocarcinoma, we will send his tissue for molecular studies with PD-L1 via foundation 1 testing.   We will see the patient back for follow-up visit in 1 week for more  detailed discussion about his current conditions and recommended treatment options pending on the final pathology.   In the meantime, the patient will complete SRS to the brain lesion on 11/14/2019 under the care of Dr. Lisbeth Renshaw.  The patient voices understanding of current disease status and treatment options and is in agreement with the current care plan.  All questions were answered. The patient knows to call the clinic with any problems, questions or concerns. We can certainly see the patient much sooner if necessary.  Thank you so much for allowing me to participate in the care of Joseph Atkins. I will continue to follow up the patient with you and assist in his care.  The total time spent in the appointment was 60 minutes.  Disclaimer: This note was dictated with voice recognition software. Similar sounding words can inadvertently be transcribed and may not be corrected upon review.   Stacy Deshler L Joachim Carton November 06, 2019, 3:00 PM  ADDENDUM: Hematology/Oncology Attending: I had a face-to-face encounter with the patient today.  I recommended his care plan.  This is a very pleasant 59 years old white male who presented to the emergency department at [facility] on 10/18/2019 complaining of several  weeks of disease numbness as well as headache, nausea vomiting and gait changes.  During his evaluation he had CT scan of the head followed by MRI of the brain that showed 2.0 cm rim enhancing mass in the left cerebellar with surrounding white matter edema consistent with metastatic disease.  The patient had CT scan of the chest, abdomen pelvis performed on 10/18/2019 and it showed a large right lower lobe infrahilar mass with heterogeneous enhancement highly suspicious for bronchogenic carcinoma.  This mass measures up to 6.5 x 5.3 cm.  The mass abuts the IVC also does not invade it.  The mass also abuts the right hemidiaphragm medially.  There is anterior tenting of the right major fissure  inferiorly without definite invasion of the right middle lobe. There was no definite metastatic disease within the chest, abdomen or pelvis and there was a stable chronic left hepatic lobe lesion consistent with a benign findings. The patient was referred to Dr. Lamonte Sakai and he underwent bronchoscopy initially on 10/24/2019 that was not conclusive.  He had repeat bronchoscopy with electromagnetic navigational bronchoscopy under the care of Dr. Lamonte Sakai on 11/05/2019.  The final pathology is still pending. The patient is currently on Decadron for the vasogenic edema and he scheduled to see Dr. Lisbeth Renshaw and Dr. Ellene Route soon for stereotactic radiotherapy to the brain lesion. The patient is scheduled to have a PET scan on 11/13/2019. I had a lengthy discussion with the patient and his wife today about his current condition and possible treatment options. I personally and independently reviewed his scan and showed the images to the patient and his wife. We will wait for the final pathology before making recommendation regarding treatment of his condition. If the final pathology is consistent with non-small cell lung cancer, adenocarcinoma, we will send the tissue block for molecular studies and PD-L1 expression. I recommended for the patient to proceed with the Troup treatment to the brain lesion as recommended by Dr. Lisbeth Renshaw. If the patient has no other evidence of metastatic disease outside the right lung lesion and the final pathology is consistent with non-small cell lung cancer, he could benefit from an aggressive local disease with concurrent chemoradiation followed by systemic therapy later on. We will arrange for the patient to come back for follow-up visit after his biopsy and PET scan for more detailed discussion of his treatment options. He will continue to taper the dose of Decadron as recommended by Dr. Lisbeth Renshaw. The patient was advised to call immediately if he has any other concerning symptoms in the  interval.  Disclaimer: This note was dictated with voice recognition software. Similar sounding words can inadvertently be transcribed and may be missed upon review. Eilleen Kempf, MD 11/06/19

## 2019-11-04 NOTE — Telephone Encounter (Signed)
Dr. Lamonte Sakai, did you receive the CD for this patient? Please advise.

## 2019-11-04 NOTE — Telephone Encounter (Signed)
noted 

## 2019-11-04 NOTE — Telephone Encounter (Signed)
We have the disc, thanks.

## 2019-11-05 ENCOUNTER — Ambulatory Visit (HOSPITAL_COMMUNITY)
Admission: RE | Admit: 2019-11-05 | Discharge: 2019-11-05 | Disposition: A | Discharge status: Home / Self Care | Payer: Managed Care, Other (non HMO) | Attending: Emergency Medicine | Admitting: Emergency Medicine

## 2019-11-05 ENCOUNTER — Other Ambulatory Visit: Payer: Self-pay

## 2019-11-05 ENCOUNTER — Ambulatory Visit (HOSPITAL_COMMUNITY): Payer: Managed Care, Other (non HMO)

## 2019-11-05 ENCOUNTER — Encounter (HOSPITAL_COMMUNITY): Payer: Self-pay | Admitting: Emergency Medicine

## 2019-11-05 ENCOUNTER — Ambulatory Visit (HOSPITAL_COMMUNITY): Payer: Managed Care, Other (non HMO) | Admitting: Anesthesiology

## 2019-11-05 ENCOUNTER — Encounter (HOSPITAL_COMMUNITY)
Admission: RE | Disposition: A | Discharge status: Home / Self Care | Payer: Self-pay | Source: Home / Self Care | Attending: Emergency Medicine

## 2019-11-05 DIAGNOSIS — Z820 Family history of epilepsy and other diseases of the nervous system: Secondary | ICD-10-CM | POA: Diagnosis not present

## 2019-11-05 DIAGNOSIS — C3431 Malignant neoplasm of lower lobe, right bronchus or lung: Secondary | ICD-10-CM | POA: Diagnosis not present

## 2019-11-05 DIAGNOSIS — Z79899 Other long term (current) drug therapy: Secondary | ICD-10-CM | POA: Insufficient documentation

## 2019-11-05 DIAGNOSIS — K589 Irritable bowel syndrome without diarrhea: Secondary | ICD-10-CM | POA: Insufficient documentation

## 2019-11-05 DIAGNOSIS — R918 Other nonspecific abnormal finding of lung field: Secondary | ICD-10-CM | POA: Diagnosis present

## 2019-11-05 DIAGNOSIS — Z791 Long term (current) use of non-steroidal anti-inflammatories (NSAID): Secondary | ICD-10-CM | POA: Diagnosis not present

## 2019-11-05 DIAGNOSIS — K219 Gastro-esophageal reflux disease without esophagitis: Secondary | ICD-10-CM | POA: Diagnosis not present

## 2019-11-05 DIAGNOSIS — Z8249 Family history of ischemic heart disease and other diseases of the circulatory system: Secondary | ICD-10-CM | POA: Diagnosis not present

## 2019-11-05 DIAGNOSIS — E785 Hyperlipidemia, unspecified: Secondary | ICD-10-CM | POA: Insufficient documentation

## 2019-11-05 DIAGNOSIS — I10 Essential (primary) hypertension: Secondary | ICD-10-CM | POA: Insufficient documentation

## 2019-11-05 DIAGNOSIS — Z7951 Long term (current) use of inhaled steroids: Secondary | ICD-10-CM | POA: Diagnosis not present

## 2019-11-05 DIAGNOSIS — F17210 Nicotine dependence, cigarettes, uncomplicated: Secondary | ICD-10-CM | POA: Diagnosis not present

## 2019-11-05 DIAGNOSIS — Z7952 Long term (current) use of systemic steroids: Secondary | ICD-10-CM | POA: Insufficient documentation

## 2019-11-05 DIAGNOSIS — Z880 Allergy status to penicillin: Secondary | ICD-10-CM | POA: Insufficient documentation

## 2019-11-05 DIAGNOSIS — Z801 Family history of malignant neoplasm of trachea, bronchus and lung: Secondary | ICD-10-CM | POA: Diagnosis not present

## 2019-11-05 DIAGNOSIS — Z9889 Other specified postprocedural states: Secondary | ICD-10-CM

## 2019-11-05 DIAGNOSIS — E039 Hypothyroidism, unspecified: Secondary | ICD-10-CM | POA: Diagnosis not present

## 2019-11-05 HISTORY — PX: BRONCHIAL BRUSHINGS: SHX5108

## 2019-11-05 HISTORY — PX: BRONCHIAL NEEDLE ASPIRATION BIOPSY: SHX5106

## 2019-11-05 HISTORY — PX: VIDEO BRONCHOSCOPY WITH ENDOBRONCHIAL NAVIGATION: SHX6175

## 2019-11-05 LAB — COMPREHENSIVE METABOLIC PANEL
ALT: 31 U/L (ref 0–44)
AST: 22 U/L (ref 15–41)
Albumin: 3.6 g/dL (ref 3.5–5.0)
Alkaline Phosphatase: 77 U/L (ref 38–126)
Anion gap: 11 (ref 5–15)
BUN: 11 mg/dL (ref 6–20)
CO2: 23 mmol/L (ref 22–32)
Calcium: 9 mg/dL (ref 8.9–10.3)
Chloride: 102 mmol/L (ref 98–111)
Creatinine, Ser: 0.74 mg/dL (ref 0.61–1.24)
GFR calc Af Amer: >60 mL/min (ref >60)
GFR calc non Af Amer: >60 mL/min (ref >60)
Glucose, Bld: 104 mg/dL — ABNORMAL HIGH (ref 70–99)
Potassium: 4.7 mmol/L (ref 3.5–5.1)
Sodium: 136 mmol/L (ref 135–145)
Total Bilirubin: 0.6 mg/dL (ref 0.3–1.2)
Total Protein: 6.8 g/dL (ref 6.5–8.1)

## 2019-11-05 LAB — CBC
HCT: 45.2 % (ref 39.0–52.0)
Hemoglobin: 14.2 g/dL (ref 13.0–17.0)
MCH: 28.4 pg (ref 26.0–34.0)
MCHC: 31.4 g/dL (ref 30.0–36.0)
MCV: 90.4 fL (ref 80.0–100.0)
Platelets: 311 10*3/uL (ref 150–400)
RBC: 5 MIL/uL (ref 4.22–5.81)
RDW: 15.6 % — ABNORMAL HIGH (ref 11.5–15.5)
WBC: 18.3 10*3/uL — ABNORMAL HIGH (ref 4.0–10.5)
nRBC: 0 % (ref 0.0–0.2)

## 2019-11-05 LAB — PROTIME-INR
INR: 1 (ref 0.8–1.2)
Prothrombin Time: 12.3 seconds (ref 11.4–15.2)

## 2019-11-05 LAB — APTT: aPTT: 29 seconds (ref 24–36)

## 2019-11-05 IMAGING — DX DG CHEST 1V PORT
1 series · 1 of 1 positions shown · non-contrast
Comparison: [DATE]

CLINICAL DATA: Post bronchoscopy

EXAM:
PORTABLE CHEST 1 VIEW

[chest ap]
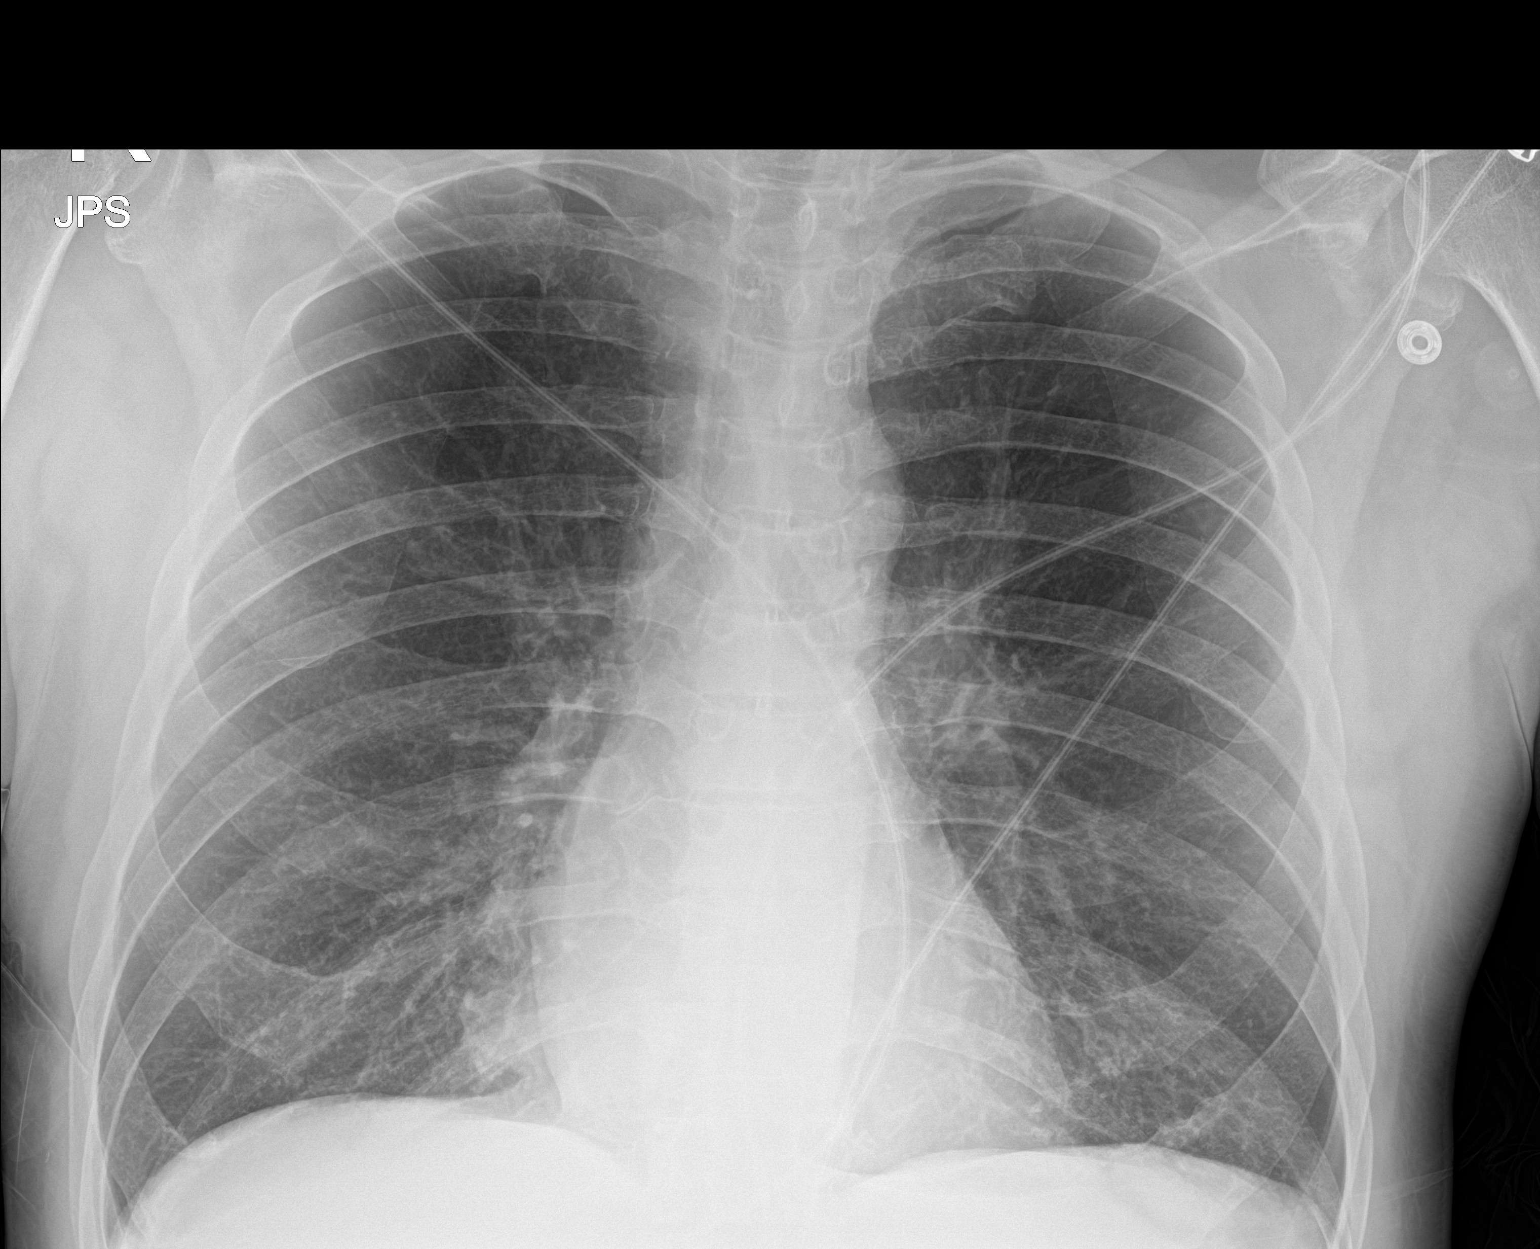

[1 of 1 positions shown; findings below may reference images not displayed]

FINDINGS: The heart size and mediastinal contours are within normal limits.
Both lungs are clear. The visualized skeletal structures are
unremarkable. No pneumothorax.
IMPRESSION: No active disease.

## 2019-11-05 SURGERY — VIDEO BRONCHOSCOPY WITH ENDOBRONCHIAL NAVIGATION
Anesthesia: General

## 2019-11-05 MED ORDER — MIDAZOLAM HCL 5 MG/5ML IJ SOLN
INTRAMUSCULAR | Status: DC | PRN
  Administered 2019-11-05: 2 mg via INTRAVENOUS

## 2019-11-05 MED ORDER — PROPOFOL 10 MG/ML IV BOLUS
INTRAVENOUS | Status: DC | PRN
  Administered 2019-11-05: 150 mg via INTRAVENOUS

## 2019-11-05 MED ORDER — SUGAMMADEX SODIUM 200 MG/2ML IV SOLN
INTRAVENOUS | Status: DC | PRN
  Administered 2019-11-05 (×3): 100 mg via INTRAVENOUS

## 2019-11-05 MED ORDER — ONDANSETRON HCL 4 MG/2ML IJ SOLN: 4.0000 mg | Freq: Once | INTRAMUSCULAR | Status: DC | PRN

## 2019-11-05 MED ORDER — FENTANYL CITRATE (PF) 100 MCG/2ML IJ SOLN
INTRAMUSCULAR | Status: DC | PRN
  Administered 2019-11-05 (×2): 50 ug via INTRAVENOUS

## 2019-11-05 MED ORDER — LIDOCAINE HCL (CARDIAC) PF 100 MG/5ML IV SOSY
PREFILLED_SYRINGE | INTRAVENOUS | Status: DC | PRN
  Administered 2019-11-05: 100 mg via INTRAVENOUS

## 2019-11-05 MED ORDER — FENTANYL CITRATE (PF) 100 MCG/2ML IJ SOLN: 25.0000 ug | INTRAMUSCULAR | Status: DC | PRN

## 2019-11-05 MED ORDER — LACTATED RINGERS IV SOLN: INTRAVENOUS | Status: DC

## 2019-11-05 MED ORDER — CHLORHEXIDINE GLUCONATE 0.12 % MT SOLN
OROMUCOSAL | Status: AC
  Filled 2019-11-05: qty 15

## 2019-11-05 MED ORDER — DEXMEDETOMIDINE HCL 200 MCG/2ML IV SOLN
INTRAVENOUS | Status: DC | PRN
  Administered 2019-11-05: 12 ug via INTRAVENOUS

## 2019-11-05 MED ORDER — DEXAMETHASONE SODIUM PHOSPHATE 10 MG/ML IJ SOLN
INTRAMUSCULAR | Status: DC | PRN
  Administered 2019-11-05: 10 mg via INTRAVENOUS

## 2019-11-05 MED ORDER — SUCCINYLCHOLINE CHLORIDE 20 MG/ML IJ SOLN
INTRAMUSCULAR | Status: DC | PRN
  Administered 2019-11-05: 120 mg via INTRAVENOUS

## 2019-11-05 MED ORDER — ONDANSETRON HCL 4 MG/2ML IJ SOLN
INTRAMUSCULAR | Status: DC | PRN
  Administered 2019-11-05: 4 mg via INTRAVENOUS

## 2019-11-05 MED ORDER — ROCURONIUM BROMIDE 100 MG/10ML IV SOLN
INTRAVENOUS | Status: DC | PRN
  Administered 2019-11-05: 50 mg via INTRAVENOUS

## 2019-11-05 MED ORDER — CHLORHEXIDINE GLUCONATE 0.12 % MT SOLN
15.0000 mL | Freq: Once | OROMUCOSAL | Status: DC
  Filled 2019-11-05: qty 15

## 2019-11-05 NOTE — Op Note (Signed)
Video Bronchoscopy with Electromagnetic Navigation Procedure Note  Date of Operation: 11/05/2019  Pre-op Diagnosis: Right lower lobe mass  Post-op Diagnosis: Same  Surgeon: Baltazar Apo  Assistants: None  Anesthesia: General endotracheal anesthesia  Operation: Flexible video fiberoptic bronchoscopy with electromagnetic navigation and biopsies.  Estimated Blood Loss: Minimal  Complications: None apparent  Indications and History: Joseph Atkins is a 59 y.o. male with history of tobacco use. He was found to have a RLL mass on CT chest. Recommendation made to achieve a tissue diagnosis via navigational bronchoscopy. The risks, benefits, complications, treatment options and expected outcomes were discussed with the patient.  The possibilities of pneumothorax, pneumonia, reaction to medication, pulmonary aspiration, perforation of a viscus, bleeding, failure to diagnose a condition and creating a complication requiring transfusion or operation were discussed with the patient who freely signed the consent.    Description of Procedure: The patient was seen in the Preoperative Area, was examined and was deemed appropriate to proceed.  The patient was taken to Endoscopy Room 2, identified as [name] and the procedure verified as Flexible Video Fiberoptic Bronchoscopy.  A Time Out was held and the above information confirmed.   Prior to the date of the procedure a high-resolution CT scan of the chest was performed. Utilizing [place] a virtual tracheobronchial tree was generated to allow the creation of distinct navigation pathways to the patient's parenchymal abnormalities. After being taken to the operating room general anesthesia was initiated and the patient  was orally intubated. The video fiberoptic bronchoscope was introduced via the endotracheal tube and a general inspection was performed which showed normal airways throughout. There were no endobronchial lesions.  The extendable working channel and locator guide were introduced into the bronchoscope. The distinct navigation pathways prepared prior to this procedure were then utilized to navigate to within 0.4cm of patient's lesion identified on CT scan. The extendable working channel was secured into place and the locator guide was withdrawn. Under fluoroscopic guidance transbronchial needle brushings, transbronchial Wang needle biopsies, and transbronchial forceps biopsies were performed to be sent for cytology and pathology. At the end of the procedure a general airway inspection was performed and there was no evidence of active bleeding. The bronchoscope was removed.  The patient tolerated the procedure well. There was no significant blood loss and there were no obvious complications. A post-procedural chest x-ray is pending.   Samples: 1. Transbronchial needle brushings from RLL mass 2. Transbronchial Wang needle biopsies from RLL mass 3. Transbronchial forceps biopsies from RLL mass  Plans:  The patient will be discharged from the PACU to home when recovered from anesthesia and after chest x-ray is reviewed. We will review the cytology, pathology and microbiology results with the patient when they become available. Outpatient followup will be with Dr Lamonte Sakai and [facility]    Baltazar Apo, MD, PhD 11/05/2019, 8:35 AM [facility] [phone] or if no answer [phone]

## 2019-11-05 NOTE — Interval H&P Note (Signed)
History and Physical Interval Note:  11/05/2019 7:24 AM  Joseph Atkins is 59 year old smoker who was found to have a cerebellar mass.  Subsequent CT chest revealed a right lower lobe mass with central necrosis and small subcarinal lymphadenopathy on 10/18/2019.  He underwent bronchoscopy with EBUS but this was nondiagnostic.  He returns now for repeat bronchoscopy, ENB with the diagnosis of LUNG MASS.  The various methods of treatment have been discussed with the patient and family. After consideration of risks, benefits and other options for treatment, the patient has consented to  Procedure(s): [place] (N/A) as a surgical intervention.  The patient's history has been reviewed, patient examined, no change in status, stable for surgery.  I have reviewed the patient's chart and labs.  Questions were answered to the patient's satisfaction.     Collene Gobble

## 2019-11-05 NOTE — Anesthesia Postprocedure Evaluation (Signed)
Anesthesia Post Note  Patient: Joseph Atkins  Procedure(s) Performed: VIDEO BRONCHOSCOPY WITH ENDOBRONCHIAL NAVIGATION (N/A ) BRONCHIAL BRUSHINGS     Patient location during evaluation: PACU Anesthesia Type: General Level of consciousness: awake and alert, oriented and awake Pain management: pain level controlled Vital Signs Assessment: post-procedure vital signs reviewed and stable Respiratory status: spontaneous breathing, nonlabored ventilation and respiratory function stable Cardiovascular status: blood pressure returned to baseline and stable Postop Assessment: no apparent nausea or vomiting Anesthetic complications: no   No complications documented.  Last Vitals:  Vitals:   11/05/19 0911 11/05/19 0915  BP: (!) 151/78   Pulse: (!) 57 60  Resp: 16 14  Temp: 36.4 C   SpO2: 100% 100%    Last Pain:  Vitals:   11/05/19 0911  PainSc: 0-No pain                 Catalina Gravel

## 2019-11-05 NOTE — Transfer of Care (Signed)
Immediate Anesthesia Transfer of Care Note  Patient: Joseph Atkins  Procedure(s) Performed: VIDEO BRONCHOSCOPY WITH ENDOBRONCHIAL NAVIGATION (N/A ) BRONCHIAL BRUSHINGS  Patient Location: PACU  Anesthesia Type:General  Level of Consciousness: awake, alert  and oriented  Airway & Oxygen Therapy: Patient Spontanous Breathing and Patient connected to nasal cannula oxygen  Post-op Assessment: Report given to RN, Post -op Vital signs reviewed and stable and Patient moving all extremities  Post vital signs: Reviewed and stable  Last Vitals:  Vitals Value Taken Time  BP 158/86 11/05/19 0841  Temp    Pulse 69 11/05/19 0843  Resp 18 11/05/19 0843  SpO2 96 % 11/05/19 0843  Vitals shown include unvalidated device data.  Last Pain:  Vitals:   11/05/19 0843  PainSc: (P) 0-No pain      Patients Stated Pain Goal: 1 (16/10/96 0454)  Complications: No complications documented.

## 2019-11-05 NOTE — Discharge Instructions (Signed)
Flexible Bronchoscopy, Care After This sheet gives you information about how to care for yourself after your test. Your doctor may also give you more specific instructions. If you have problems or questions, contact your doctor. Follow these instructions at home: Eating and drinking  Do not eat or drink anything (not even water) for 2 hours after your test, or until your numbing medicine (local anesthetic) wears off.  When your numbness is gone and your cough and gag reflexes have come back, you may: ? Eat only soft foods. ? Slowly drink liquids.  The day after the test, go back to your normal diet. Driving  Do not drive for 24 hours if you were given a medicine to help you relax (sedative).  Do not drive or use heavy machinery while taking prescription pain medicine. General instructions   Take over-the-counter and prescription medicines only as told by your doctor.  Return to your normal activities as told. Ask what activities are safe for you.  Do not use any products that have nicotine or tobacco in them. This includes cigarettes and e-cigarettes. If you need help quitting, ask your doctor.  Keep all follow-up visits as told by your doctor. This is important. It is very important if you had a tissue sample (biopsy) taken. Get help right away if:  You have shortness of breath that gets worse.  You get light-headed.  You feel like you are going to pass out (faint).  You have chest pain.  You cough up: ? More than a little blood. ? More blood than before. Summary  Do not eat or drink anything (not even water) for 2 hours after your test, or until your numbing medicine wears off.  Do not use cigarettes. Do not use e-cigarettes.  Get help right away if you have chest pain.   Please call our office for any questions or concerns. [phone].    This information is not intended to replace advice given to you by your health care provider. Make sure you discuss any  questions you have with your health care provider. Document Revised: 03/24/2017 Document Reviewed: 04/29/2016 Elsevier Patient Education  2020 [employer].

## 2019-11-05 NOTE — Anesthesia Procedure Notes (Signed)
Procedure Name: Intubation Date/Time: 11/05/2019 7:42 AM Performed by: Lulamae Skorupski T, CRNA Pre-anesthesia Checklist: Patient identified, Emergency Drugs available, Suction available and Patient being monitored Patient Re-evaluated:Patient Re-evaluated prior to induction Oxygen Delivery Method: Circle system utilized Preoxygenation: Pre-oxygenation with 100% oxygen Induction Type: IV induction Ventilation: Mask ventilation without difficulty Laryngoscope Size: Mac and 4 Grade View: Grade II Tube type: Oral Tube size: 8.5 mm Number of attempts: 1 Airway Equipment and Method: Stylet,  Oral airway and LTA kit utilized Placement Confirmation: ETT inserted through vocal cords under direct vision,  positive ETCO2 and breath sounds checked- equal and bilateral Secured at: 23 cm Tube secured with: Tape Dental Injury: Teeth and Oropharynx as per pre-operative assessment

## 2019-11-06 ENCOUNTER — Inpatient Hospital Stay: Payer: Managed Care, Other (non HMO) | Attending: Physician Assistant | Admitting: Physician Assistant

## 2019-11-06 ENCOUNTER — Encounter: Payer: Self-pay | Admitting: Physician Assistant

## 2019-11-06 ENCOUNTER — Inpatient Hospital Stay: Payer: Managed Care, Other (non HMO)

## 2019-11-06 ENCOUNTER — Other Ambulatory Visit: Payer: Self-pay

## 2019-11-06 ENCOUNTER — Telehealth: Payer: Self-pay | Admitting: *Deleted

## 2019-11-06 VITALS — BP 132/77 | HR 69 | Temp 97.3°F | Resp 17 | Ht 68.0 in | Wt 178.3 lb

## 2019-11-06 DIAGNOSIS — R42 Dizziness and giddiness: Secondary | ICD-10-CM | POA: Insufficient documentation

## 2019-11-06 DIAGNOSIS — C7931 Secondary malignant neoplasm of brain: Secondary | ICD-10-CM | POA: Diagnosis not present

## 2019-11-06 DIAGNOSIS — I10 Essential (primary) hypertension: Secondary | ICD-10-CM | POA: Diagnosis not present

## 2019-11-06 DIAGNOSIS — I700 Atherosclerosis of aorta: Secondary | ICD-10-CM | POA: Diagnosis not present

## 2019-11-06 DIAGNOSIS — F17210 Nicotine dependence, cigarettes, uncomplicated: Secondary | ICD-10-CM | POA: Insufficient documentation

## 2019-11-06 DIAGNOSIS — M50321 Other cervical disc degeneration at C4-C5 level: Secondary | ICD-10-CM | POA: Diagnosis not present

## 2019-11-06 DIAGNOSIS — C3431 Malignant neoplasm of lower lobe, right bronchus or lung: Secondary | ICD-10-CM | POA: Diagnosis not present

## 2019-11-06 DIAGNOSIS — K219 Gastro-esophageal reflux disease without esophagitis: Secondary | ICD-10-CM | POA: Diagnosis not present

## 2019-11-06 DIAGNOSIS — K589 Irritable bowel syndrome without diarrhea: Secondary | ICD-10-CM | POA: Insufficient documentation

## 2019-11-06 DIAGNOSIS — I6782 Cerebral ischemia: Secondary | ICD-10-CM | POA: Insufficient documentation

## 2019-11-06 DIAGNOSIS — R918 Other nonspecific abnormal finding of lung field: Secondary | ICD-10-CM

## 2019-11-06 DIAGNOSIS — J439 Emphysema, unspecified: Secondary | ICD-10-CM | POA: Diagnosis not present

## 2019-11-06 DIAGNOSIS — J449 Chronic obstructive pulmonary disease, unspecified: Secondary | ICD-10-CM | POA: Diagnosis not present

## 2019-11-06 DIAGNOSIS — Z7952 Long term (current) use of systemic steroids: Secondary | ICD-10-CM | POA: Diagnosis not present

## 2019-11-06 DIAGNOSIS — E785 Hyperlipidemia, unspecified: Secondary | ICD-10-CM | POA: Diagnosis not present

## 2019-11-06 DIAGNOSIS — E039 Hypothyroidism, unspecified: Secondary | ICD-10-CM | POA: Diagnosis not present

## 2019-11-06 DIAGNOSIS — Z79899 Other long term (current) drug therapy: Secondary | ICD-10-CM | POA: Diagnosis not present

## 2019-11-06 LAB — CBC WITH DIFFERENTIAL (CANCER CENTER ONLY)
Abs Immature Granulocytes: 0.28 10*3/uL — ABNORMAL HIGH (ref 0.00–0.07)
Basophils Absolute: 0.1 10*3/uL (ref 0.0–0.1)
Basophils Relative: 0 %
Eosinophils Absolute: 0.2 10*3/uL (ref 0.0–0.5)
Eosinophils Relative: 1 %
HCT: 42.5 % (ref 39.0–52.0)
Hemoglobin: 13.6 g/dL (ref 13.0–17.0)
Immature Granulocytes: 1 %
Lymphocytes Relative: 18 %
Lymphs Abs: 3.6 10*3/uL (ref 0.7–4.0)
MCH: 28.2 pg (ref 26.0–34.0)
MCHC: 32 g/dL (ref 30.0–36.0)
MCV: 88 fL (ref 80.0–100.0)
Monocytes Absolute: 1 10*3/uL (ref 0.1–1.0)
Monocytes Relative: 5 %
Neutro Abs: 14.7 10*3/uL — ABNORMAL HIGH (ref 1.7–7.7)
Neutrophils Relative %: 75 %
Platelet Count: 318 10*3/uL (ref 150–400)
RBC: 4.83 MIL/uL (ref 4.22–5.81)
RDW: 15.7 % — ABNORMAL HIGH (ref 11.5–15.5)
WBC Count: 19.7 10*3/uL — ABNORMAL HIGH (ref 4.0–10.5)
nRBC: 0 % (ref 0.0–0.2)

## 2019-11-06 LAB — CMP (CANCER CENTER ONLY)
ALT: 27 U/L (ref 0–44)
AST: 17 U/L (ref 15–41)
Albumin: 3.5 g/dL (ref 3.5–5.0)
Alkaline Phosphatase: 81 U/L (ref 38–126)
Anion gap: 11 (ref 5–15)
BUN: 17 mg/dL (ref 6–20)
CO2: 26 mmol/L (ref 22–32)
Calcium: 9.6 mg/dL (ref 8.9–10.3)
Chloride: 100 mmol/L (ref 98–111)
Creatinine: 0.91 mg/dL (ref 0.61–1.24)
GFR, Est AFR Am: >60 mL/min (ref >60)
GFR, Estimated: >60 mL/min (ref >60)
Glucose, Bld: 107 mg/dL — ABNORMAL HIGH (ref 70–99)
Potassium: 5.2 mmol/L — ABNORMAL HIGH (ref 3.5–5.1)
Sodium: 137 mmol/L (ref 135–145)
Total Bilirubin: 0.3 mg/dL (ref 0.3–1.2)
Total Protein: 7.1 g/dL (ref 6.5–8.1)

## 2019-11-06 NOTE — Telephone Encounter (Signed)
CALLED PATIENT TO INFORM OF PET SCAN FOR 11-13-19 - ARRIVAL TIME- 1:30 PM @ [facility], PATIENT TO HAVE WATER ONLY- 6 HRS. PRIOR TO TEST, SPOKE WITH PATIENT AND HIS WIFE AND THEY BOTH ARE AWARE OF THIS TEST

## 2019-11-06 NOTE — Patient Instructions (Addendum)
-  It was nice meeting both of you today. -I know we covered a lot of important information at your visit today, here is a summary of the main take away points. -The biopsy (sample) that they took from your procedure yesterday is still pending.  This is very important as it will give [name] more information about what the best treatment option is for you.  There are 2 main categories of lung cancer called non-small cell lung cancer and small cell lung cancer. -It appears that the areas involved are the brain as well as the main tumor in the right lower lobe.  Please keep the appointment next week for the PET scan, as that will confirm if any other areas are involved. -I also would like you to please keep your appointments with Dr. Lisbeth Renshaw from radiation oncology as scheduled.  The priority at this point time is to complete your brain radiation as planned.  Once this is complete, we will talk about what the recommended treatment is for the lung. Right now, we are thinking about concurrent chemoradiation but this depends on what the PET scan will show. We will discuss this in more details next week when we see you.

## 2019-11-07 ENCOUNTER — Ambulatory Visit
Admission: RE | Admit: 2019-11-07 | Discharge: 2019-11-07 | Disposition: A | Discharge status: Home / Self Care | Payer: Managed Care, Other (non HMO) | Source: Ambulatory Visit | Attending: Radiation Oncology | Admitting: Radiation Oncology

## 2019-11-07 ENCOUNTER — Encounter (HOSPITAL_COMMUNITY): Payer: Self-pay | Admitting: Emergency Medicine

## 2019-11-07 ENCOUNTER — Telehealth: Payer: Self-pay | Admitting: Internal Medicine

## 2019-11-07 ENCOUNTER — Telehealth: Payer: Self-pay | Admitting: Emergency Medicine

## 2019-11-07 DIAGNOSIS — C7949 Secondary malignant neoplasm of other parts of nervous system: Secondary | ICD-10-CM

## 2019-11-07 DIAGNOSIS — C7931 Secondary malignant neoplasm of brain: Secondary | ICD-10-CM

## 2019-11-07 LAB — CYTOLOGY - NON PAP

## 2019-11-07 LAB — SURGICAL PATHOLOGY

## 2019-11-07 IMAGING — MR MR HEAD WO/W CM
11 series · 48 of 48 positions shown · IV contrast (multihance)
Comparison: [DATE] MRI head.

CLINICAL DATA: Brain/CNS neoplasm, staging.

EXAM:
MRI HEAD WITHOUT AND WITH CONTRAST
TECHNIQUE: Multiplanar, multiecho pulse sequences of the brain and surrounding
structures were obtained without and with intravenous contrast.
CONTRAST:  20mL MULTIHANCE GADOBENATE DIMEGLUMINE 529 MG/ML IV SOLN

[Series 2: FLAIR · sagittal · 3.0mm · 0.94mm/px · 2 of 45 slices shown (1 of 2)]
[im 1/45]
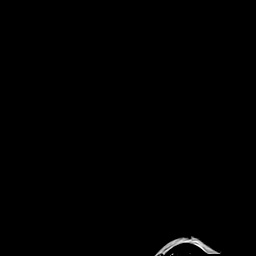
[im 45/45]
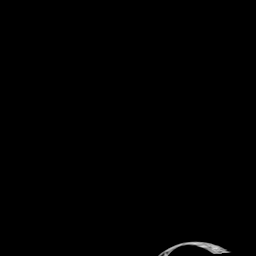

[Series 3: DWI · axial · 3.0mm · 1.50mm/px · z∈[-90,+85]mm · 5 of 91 slices shown (1 of 2)]
[im 1/91]
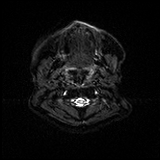
[im 23/91]
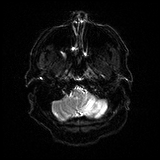
[im 46/91]
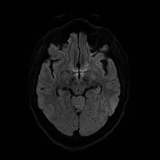
[im 68/91]
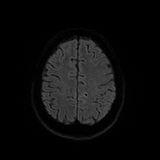
[im 91/91]
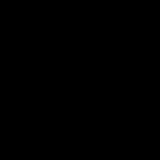

[Series 4: DWI · axial · 3.0mm · 1.50mm/px · z∈[-90,+81]mm · 3 of 45 slices shown (2 of 2)]
[im 1/45]
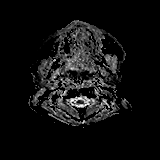
[im 23/45]
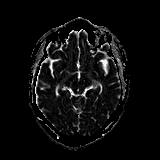
[im 45/45]
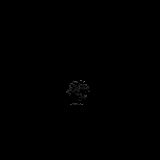

[Series 5: T2 · axial · 5.0mm · 0.69mm/px · z∈[-90,+90]mm · 2 of 31 slices shown]
[im 1/31]
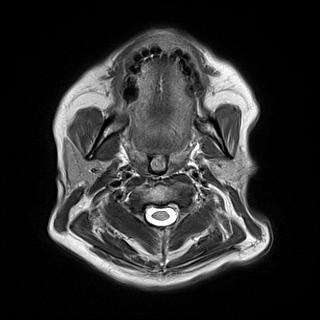
[im 31/31]
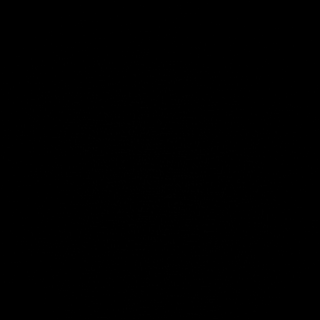

[Series 7: swi_images · axial · 1.5mm · 0.90mm/px · z∈[-78,+89]mm · 6 of 112 slices shown]
[im 1/112]
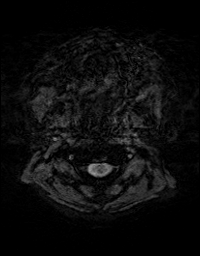
[im 23/112]
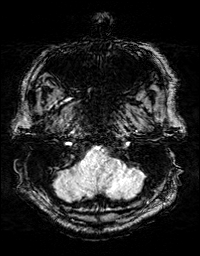
[im 45/112]
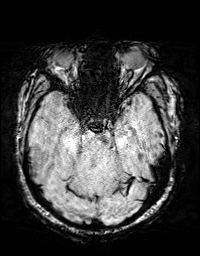
[im 67/112]
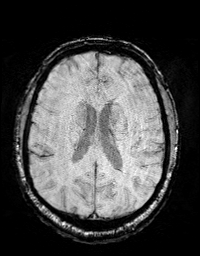
[im 89/112]
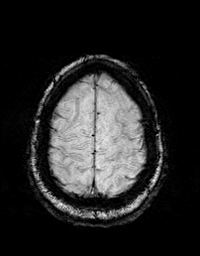
[im 112/112]
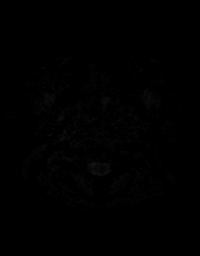

[Series 8: FLAIR · axial · 3.0mm · 0.86mm/px · z∈[-79,+83]mm · 3 of 55 slices shown (2 of 2)]
[im 1/55]
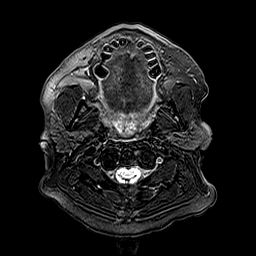
[im 28/55]
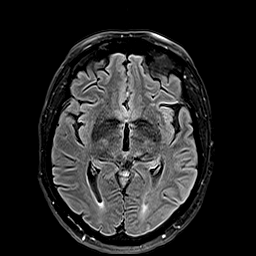
[im 55/55]
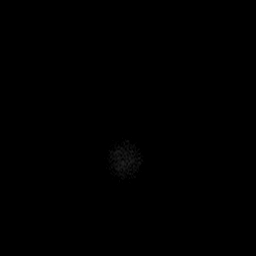

[Series 9: T1 · axial · 1.0mm · 0.75mm/px · z∈[-73,+86]mm · 9 of 160 slices shown]
[im 1/160]
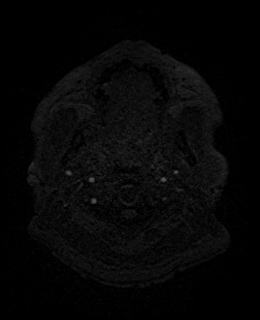
[im 20/160]
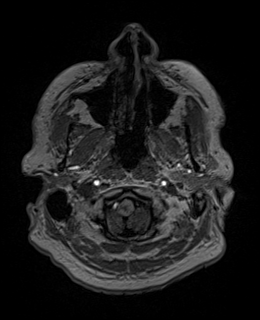
[im 40/160]
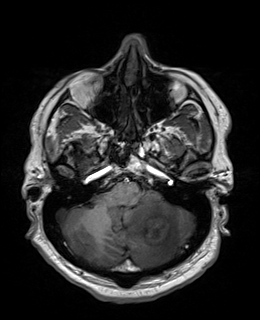
[im 60/160]
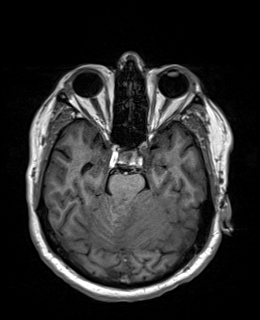
[im 80/160]
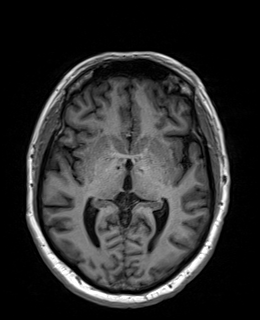
[im 100/160]
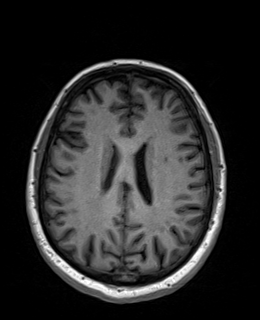
[im 120/160]
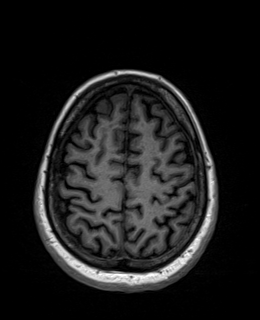
[im 140/160]
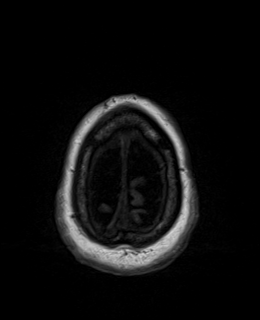
[im 160/160]
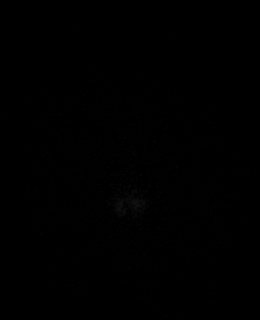

[Series 10: T2 post-contrast · coronal · 3.0mm · 0.86mm/px · 3 of 47 slices shown]
[im 1/47]
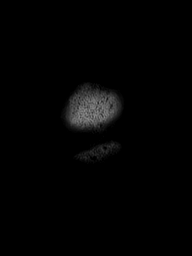
[im 24/47]
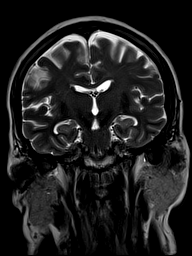
[im 47/47]
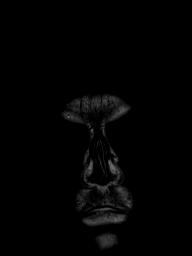

[Series 11: T1 post-contrast · axial · 1.0mm · 0.75mm/px · z∈[-73,+86]mm · 9 of 160 slices shown (1 of 2)]
[im 1/160]
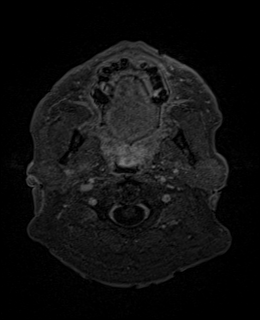
[im 20/160]
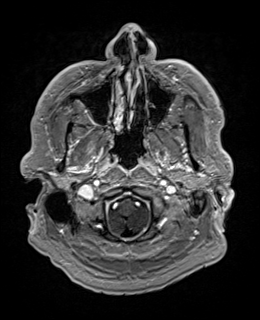
[im 40/160]
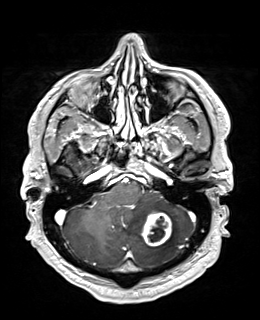
[im 60/160]
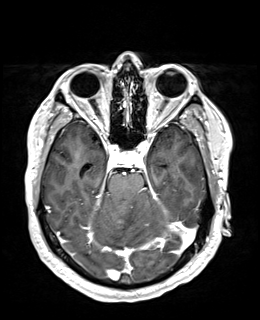
[im 80/160]
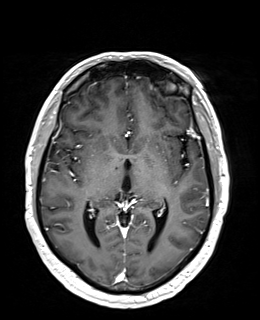
[im 100/160]
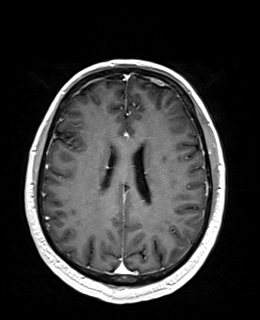
[im 120/160]
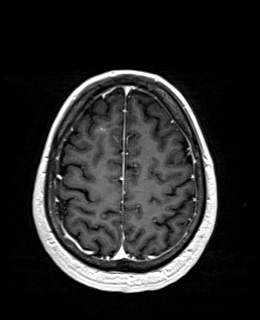
[im 140/160]
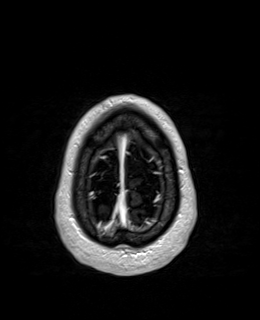
[im 160/160]
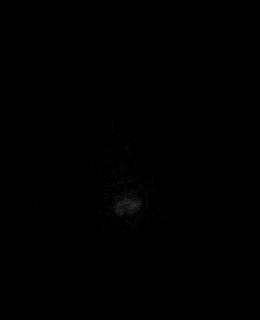

[Series 12: T1 post-contrast · coronal · 3.0mm · 0.86mm/px · 3 of 47 slices shown (2 of 2)]
[im 1/47]
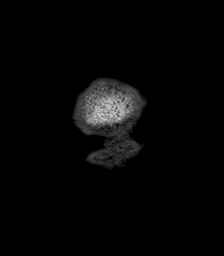
[im 24/47]
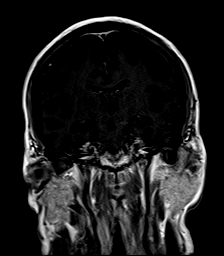
[im 47/47]
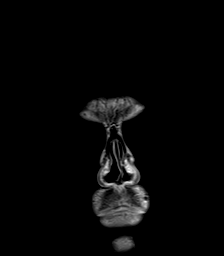

[Series 13: FLAIR post-contrast · sagittal · 3.0mm · 0.94mm/px · 3 of 45 slices shown]
[im 1/45]
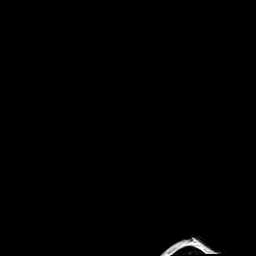
[im 23/45]
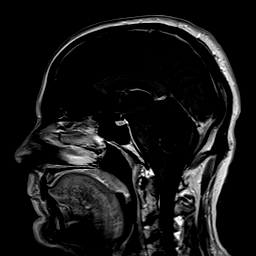
[im 45/45]
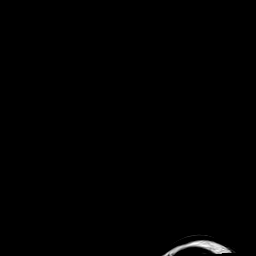

[48 of 48 positions shown; findings below may reference images not displayed]

FINDINGS: Brain:

Increased size of peripherally enhancing, centrally necrotic 2.4 x
2.3 cm left cerebellar mass ([DATE]) with associated peripheral
restricted diffusion. Perilesional edema involving the left greater
than right cerebellum with partial effacement of the fourth
ventricle is grossly unchanged.

New punctate high left frontal enhancement involving the precentral
gyrus ([DATE]).

Scattered T2/FLAIR supratentorial white matter foci are nonspecific
however commonly associated with chronic microvascular ischemic
changes.

No acute infarct or intracranial hemorrhage. No midline shift,
ventriculomegaly or extra-axial fluid collection.

Vascular: Normal flow voids. Right frontal developmental venous
anomaly.

Skull and upper cervical spine: Normal marrow signal.

Sinuses/Orbits: Normal orbits. Right maxillary sinus mucous
retention cyst. Trace bilateral mastoid effusions.

Other: None.
IMPRESSION: Increased size of 2.4 cm left cerebellar metastasis.

New punctate left precentral gyrus enhancement may be vascular in
origin. Continued attention on follow-up.

Mild chronic microvascular ischemic changes.

## 2019-11-07 MED ORDER — GADOBENATE DIMEGLUMINE 529 MG/ML IV SOLN
20.0000 mL | Freq: Once | INTRAVENOUS | Status: AC | PRN
  Administered 2019-11-07: 20 mL via INTRAVENOUS

## 2019-11-07 NOTE — Telephone Encounter (Signed)
I reviewed the pathology results from the patient's navigational bronchoscopy with his girlfriend and with the patient by phone.  His pathology and cytology showed non-small cell lung cancer, probably adenocarcinoma based on immunohistochemical staining.  Hopefully there will be enough to send for molecular testing.  They understand the information, are planning to follow-up with Dr. Julien Nordmann.  Firstly will be working with Dr. Lisbeth Renshaw in radiation oncology for his brain radiation.  I will send this to Dr. Julien Nordmann as Juluis Rainier

## 2019-11-07 NOTE — Telephone Encounter (Signed)
Scheduled per los. Called and spoke with Abigail Butts. Confirmed appt

## 2019-11-07 NOTE — Telephone Encounter (Signed)
Ok. Thank you.

## 2019-11-08 ENCOUNTER — Other Ambulatory Visit: Payer: Self-pay | Admitting: Urology

## 2019-11-08 ENCOUNTER — Encounter: Payer: Self-pay | Admitting: *Deleted

## 2019-11-08 ENCOUNTER — Telehealth: Payer: Self-pay | Admitting: *Deleted

## 2019-11-08 ENCOUNTER — Other Ambulatory Visit: Payer: Self-pay

## 2019-11-08 ENCOUNTER — Ambulatory Visit
Admission: RE | Admit: 2019-11-08 | Discharge: 2019-11-08 | Disposition: A | Discharge status: Home / Self Care | Payer: Managed Care, Other (non HMO) | Source: Ambulatory Visit | Attending: Radiation Oncology | Admitting: Radiation Oncology

## 2019-11-08 ENCOUNTER — Encounter: Payer: Self-pay | Admitting: Urology

## 2019-11-08 DIAGNOSIS — Z510 Encounter for antineoplastic radiation therapy: Secondary | ICD-10-CM | POA: Diagnosis not present

## 2019-11-08 DIAGNOSIS — C7931 Secondary malignant neoplasm of brain: Secondary | ICD-10-CM | POA: Diagnosis not present

## 2019-11-08 DIAGNOSIS — C3431 Malignant neoplasm of lower lobe, right bronchus or lung: Secondary | ICD-10-CM | POA: Insufficient documentation

## 2019-11-08 MED ORDER — SODIUM CHLORIDE 0.9% FLUSH
10.0000 mL | Freq: Once | INTRAVENOUS | Status: AC
  Administered 2019-11-08: 10 mL via INTRAVENOUS

## 2019-11-08 MED ORDER — HYDROMORPHONE HCL 1 MG/ML IJ SOLN
1.0000 mg | Freq: Once | INTRAMUSCULAR | Status: AC
  Administered 2019-11-08: 1 mg via INTRAVENOUS
  Filled 2019-11-08: qty 1

## 2019-11-08 MED ORDER — DEXAMETHASONE 4 MG PO TABS
4.0000 mg | ORAL_TABLET | Freq: Two times a day (BID) | ORAL | 0 refills | Status: DC
Start: 2019-11-08 — End: 2020-01-24

## 2019-11-08 MED ORDER — OXYCODONE-ACETAMINOPHEN 5-325 MG PO TABS: 1.0000 | ORAL_TABLET | ORAL | 0 refills | Status: DC | PRN

## 2019-11-08 MED ORDER — HYDROMORPHONE HCL 1 MG/ML IJ SOLN
1.0000 mg | Freq: Once | INTRAMUSCULAR | Status: DC
  Filled 2019-11-08: qty 1

## 2019-11-08 NOTE — Progress Notes (Signed)
Per Dr. Julien Atkins, I notified path dept requested Foundation one and PDL 1 testing to be completed

## 2019-11-08 NOTE — Progress Notes (Signed)
I met with the patient briefly to sign informed consent prior to his CT SIM/treatment planning this morning.  He has freely signed written consent to proceed and a copy of this document has been placed in his medical record.  However, during our discussion he mentioned that he is having severe headaches despite taking Decadron 2 mg p.o. twice daily as prescribed.  He is also had significant nausea and vomiting recently which does respond to Zofran which he has been using only as needed.  I have recommended that he increase his Decadron to 4 mg p.o. twice daily and he reports that he has a bottle of 4 mg tablets at home for use.  I also recommended that he take Zofran on a scheduled basis every 6 hours to prevent nausea and vomiting until we have completed treatment of this cerebellar lesion.  We discussed that once he has completed treatment, we will taper him off of the steroids and he will likely no longer need Zofran around-the-clock.  I am going to send a prescription for Percocet 5/325 mg to be taken 1 p.o. every 4 hours as needed headache.  Because he is having severe headache currently, we will also give him 1 mg of Dilaudid IM so that he will better tolerate the CT SIM procedure.  He and his wife report a good understanding of our recommendations and are comfortable and in agreement with the stated plan.  We will proceed with treatment planning accordingly.  Nicholos Johns, MMS, PA-C [place] [facility] [place]: [phone]  Fax: [phone]

## 2019-11-08 NOTE — Telephone Encounter (Signed)
Moleculars requested

## 2019-11-08 NOTE — Progress Notes (Signed)
Has armband been applied?  Yes  Does patient have an allergy to IV contrast dye?: No   Has patient ever received premedication for IV contrast dye?: n/a  Does patient take metformin?: No  If patient does take metformin when was the last dose: n/a  Date of lab work: 11/06/2019 BUN: 17 CR: 0.91 Egfr: >60  IV site:   Has IV site been added to flowsheet?

## 2019-11-08 NOTE — Progress Notes (Signed)
I was updated that there is not enough tissue for molecular testing.  I updated Dr. Julien Nordmann. He would like [facility] 360 test.  I will complete order and completed requisition form.  Will place form on Cassie's door.

## 2019-11-11 ENCOUNTER — Other Ambulatory Visit: Payer: Self-pay

## 2019-11-11 ENCOUNTER — Inpatient Hospital Stay: Payer: Managed Care, Other (non HMO)

## 2019-11-11 ENCOUNTER — Other Ambulatory Visit: Payer: Self-pay | Admitting: Physician Assistant

## 2019-11-11 DIAGNOSIS — C3431 Malignant neoplasm of lower lobe, right bronchus or lung: Secondary | ICD-10-CM

## 2019-11-11 MED ORDER — LIDOCAINE-PRILOCAINE 2.5-2.5 % EX CREA
1.0000 | TOPICAL_CREAM | CUTANEOUS | 0 refills | Status: DC | PRN
Start: 2019-11-11 — End: 2023-09-26

## 2019-11-12 ENCOUNTER — Ambulatory Visit: Payer: Managed Care, Other (non HMO) | Admitting: Radiation Oncology

## 2019-11-13 ENCOUNTER — Ambulatory Visit (HOSPITAL_COMMUNITY)
Admission: RE | Admit: 2019-11-13 | Discharge: 2019-11-13 | Disposition: A | Discharge status: Home / Self Care | Payer: Managed Care, Other (non HMO) | Source: Ambulatory Visit | Attending: Radiation Oncology | Admitting: Radiation Oncology

## 2019-11-13 ENCOUNTER — Other Ambulatory Visit: Payer: Self-pay

## 2019-11-13 ENCOUNTER — Ambulatory Visit: Payer: Managed Care, Other (non HMO) | Admitting: Radiation Oncology

## 2019-11-13 DIAGNOSIS — C3490 Malignant neoplasm of unspecified part of unspecified bronchus or lung: Secondary | ICD-10-CM | POA: Diagnosis present

## 2019-11-13 DIAGNOSIS — C7931 Secondary malignant neoplasm of brain: Secondary | ICD-10-CM | POA: Diagnosis not present

## 2019-11-13 LAB — GLUCOSE, CAPILLARY: Glucose-Capillary: 102 mg/dL — ABNORMAL HIGH (ref 70–99)

## 2019-11-13 IMAGING — PT NM PET TUM IMG INITIAL (PI) SKULL BASE T - THIGH
1 of 7 series · 1 of 25 positions shown · non-contrast
Comparison: Chest abdomen pelvis CT [DATE].

CLINICAL DATA: Subsequent treatment strategy for non-small-cell
lung cancer.

EXAM:
NUCLEAR MEDICINE PET SKULL BASE TO THIGH
TECHNIQUE: 8.7 mCi F-18 FDG was injected intravenously. Full-ring PET imaging
was performed from the skull base to thigh after the radiotracer. CT
data was obtained and used for attenuation correction and anatomic
localization.
Fasting blood glucose: 102 mg/dl

[Series 4: ct sk_thigh 5.0 b31f · axial · 5.0mm · 0.98mm/px · 1 of 232 slices shown]
[im 232/232  brain]
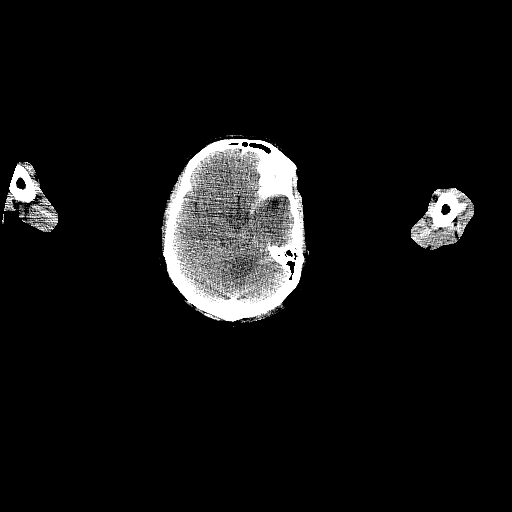

[1 of 25 positions shown; findings below may reference images not displayed]

FINDINGS: Mediastinal blood pool activity: SUV max

Liver activity: SUV max NA

NECK: No hypermetabolic lymph nodes in the neck.

Incidental CT findings: none

CHEST: Diffuse thyroid uptake is symmetric.

The large right infrahilar mass lesion is markedly hypermetabolic
with SUV max = 13.0. The hypermetabolic mass extends up into the
inferior right hilum but no definite separate or discrete
hypermetabolic right lymphadenopathy. 9 mm short axis subcarinal
node identified on previous CT scan shows no hypermetabolism but may
be below size threshold for resolution on PET imaging. No other
sites of unexpected hypermetabolic disease in the chest.

Incidental CT findings: Coronary artery calcification is evident.
Centrilobular emphsyema noted. Mild bilateral gynecomastia.

ABDOMEN/PELVIS: No abnormal hypermetabolic activity within the
liver, pancreas, adrenal glands, or spleen. No hypermetabolic lymph
nodes in the abdomen or pelvis.

Incidental CT findings: There is abdominal aortic atherosclerosis
without aneurysm.

SKELETON: No focal hypermetabolic activity to suggest skeletal
metastasis.

Incidental CT findings: none
IMPRESSION: 1. Markedly hypermetabolic right infrahilar mass lesion, consistent
with the patient's known primary neoplasm. No evidence for
hypermetabolic metastatic disease in the neck, chest, abdomen, or
pelvis.
2.  Emphysema. ([FT]-[FT])
3.  Aortic Atherosclerois ([FT]-170.0)

## 2019-11-13 MED ORDER — FLUDEOXYGLUCOSE F - 18 (FDG) INJECTION
8.7200 | Freq: Once | INTRAVENOUS | Status: AC
  Administered 2019-11-13: 8.72 via INTRAVENOUS

## 2019-11-13 NOTE — Progress Notes (Signed)
[place] OFFICE PROGRESS NOTE  Joseph Sill, NP [address] [name] [address] [place] [address]  DIAGNOSIS: Stage IV carcinoma, non-small cell lung cancer, adenocarcinoma.  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 He was diagnosed in July 2021.   PRIOR THERAPY: 1) SRS to the solitary brain metastasis under the care of Dr. Lisbeth Renshaw. Last treatment 11/14/19  CURRENT THERAPY: Weekly concurrent chemoradiation with carboplatin for an AUC of 2, paclitaxel 45 mg per metered squared.  First dose expected on 11/25/2019.  INTERVAL HISTORY: Joseph Atkins 59 y.o. male returns to the clinic for a follow up visit accompanied by his wife. The patient was recently diagnosed with stage IV lung cancer. He is scheduled to complete his SRS treatment to the metastatic brain lesion under the care of Dr. Lisbeth Renshaw on 11/14/19. He is currently taking 4 mg BID of decadron.   Otherwise, he denies any headaches, nausea, or vomiting.  He denies any fever, chills, night sweats, or weight loss.  He denies any chest pain, cough, or hemoptysis.  He reports very mild shortness of breath on exertion which is "not that bad".  He denies any diarrhea or constipation. He recently had a biopsy performed. He is here for evaluation and a more detailed discussion about his current condition and recommended treatment option.    MEDICAL HISTORY: Past Medical History:  Diagnosis Date  . Diverticulosis   . GERD (gastroesophageal reflux disease)   . Hx of small bowel obstruction   . Hyperlipidemia   . Hypothyroidism   . IBS (irritable bowel syndrome)   . Substance abuse (HCC)    Alcoholic, Drug addition  . Thyroid disease     ALLERGIES:  is allergic to penicillins.  MEDICATIONS:  Current Outpatient Medications  Medication Sig Dispense Refill  . albuterol (PROVENTIL HFA;VENTOLIN HFA) 108 (90 Base) MCG/ACT inhaler Inhale 2 puffs into the  lungs every 6 (six) hours as needed for wheezing or shortness of breath. 1 Inhaler 0  . cyclobenzaprine (FLEXERIL) 10 MG tablet Take 10 mg by mouth 2 (two) times daily as needed for muscle spasms.    Marland Kitchen dexamethasone (DECADRON) 4 MG tablet Take 1 tablet (4 mg total) by mouth 2 (two) times daily with a meal. 40 tablet 0  . docusate sodium (COLACE) 100 MG capsule Take 100 mg by mouth daily.    . fluticasone furoate-vilanterol (BREO ELLIPTA) 100-25 MCG/INH AEPB Inhale 1 puff into the lungs daily. 30 each 3  . ibuprofen (ADVIL) 800 MG tablet Take 800 mg by mouth 3 (three) times daily.    Marland Kitchen levothyroxine (SYNTHROID) 75 MCG tablet Take 75 mcg by mouth daily.    Marland Kitchen lidocaine-prilocaine (EMLA) cream Apply 1 application topically as needed. 30 g 0  . omeprazole (PRILOSEC) 40 MG capsule Take 1 capsule (40 mg total) by mouth daily. 90 capsule 4  . ondansetron (ZOFRAN) 4 MG tablet Take 4 mg by mouth every 8 (eight) hours as needed.    Marland Kitchen oxyCODONE-acetaminophen (PERCOCET/ROXICET) 5-325 MG tablet Take 1 tablet by mouth every 4 (four) hours as needed for severe pain. 30 tablet 0  . polyethylene glycol powder (MIRALAX) powder Take 17 g by mouth daily. 255 g 11  . TRULANCE 3 MG TABS Take 1 tablet by mouth daily.     No current facility-administered medications for this visit.    SURGICAL HISTORY:  Past Surgical History:  Procedure Laterality Date  .  arm surgery Right   . BRONCHIAL BRUSHINGS  10/24/2019   Procedure: BRONCHIAL BRUSHINGS;  Surgeon: Collene Gobble, MD;  Location: [facility] ENDOSCOPY;  Service: Cardiopulmonary;;  right lower lobe   . BRONCHIAL BRUSHINGS  11/05/2019   Procedure: BRONCHIAL BRUSHINGS;  Surgeon: Collene Gobble, MD;  Location: [facility] ENDOSCOPY;  Service: Pulmonary;;  . BRONCHIAL NEEDLE ASPIRATION BIOPSY  10/24/2019   Procedure: BRONCHIAL NEEDLE ASPIRATION BIOPSIES;  Surgeon: Collene Gobble, MD;  Location: [place];  Service: Cardiopulmonary;;  . BRONCHIAL NEEDLE ASPIRATION BIOPSY  11/05/2019    Procedure: BRONCHIAL NEEDLE ASPIRATION BIOPSIES;  Surgeon: Collene Gobble, MD;  Location: [facility] ENDOSCOPY;  Service: Pulmonary;;  . ENDOBRONCHIAL ULTRASOUND N/A 10/24/2019   Procedure: ENDOBRONCHIAL ULTRASOUND;  Surgeon: Collene Gobble, MD;  Location: [place];  Service: Cardiopulmonary;  Laterality: N/A;  . FINGER SURGERY Right    Middle  . VIDEO BRONCHOSCOPY N/A 10/24/2019   Procedure: VIDEO BRONCHOSCOPY WITHOUT FLUORO;  Surgeon: Collene Gobble, MD;  Location: [facility] ENDOSCOPY;  Service: Cardiopulmonary;  Laterality: N/A;  . VIDEO BRONCHOSCOPY WITH ENDOBRONCHIAL NAVIGATION N/A 11/05/2019   Procedure: VIDEO BRONCHOSCOPY WITH ENDOBRONCHIAL NAVIGATION;  Surgeon: Collene Gobble, MD;  Location: [facility] ENDOSCOPY;  Service: Pulmonary;  Laterality: N/A;    REVIEW OF SYSTEMS:   Review of Systems  Constitutional: Negative for appetite change, chills, fatigue, fever and unexpected weight change.  HENT: Negative for mouth sores, nosebleeds, sore throat and trouble swallowing.   Eyes: Negative for eye problems and icterus.  Respiratory: Positive for mild shortness of breath.  Negative for cough, hemoptysis, and wheezing.   Cardiovascular: Negative for chest pain and leg swelling.  Gastrointestinal: Negative for abdominal pain, constipation, diarrhea, nausea and vomiting.  Genitourinary: Negative for bladder incontinence, difficulty urinating, dysuria, frequency and hematuria.   Musculoskeletal: Negative for back pain, gait problem, neck pain and neck stiffness.  Skin: Negative for itching and rash.  Neurological: Positive for improved but persistent dizziness. Negative for extremity weakness, gait problem, headaches, light-headedness and seizures.  Hematological: Negative for adenopathy. Does not bruise/bleed easily.  Psychiatric/Behavioral: Negative for confusion, depression and sleep disturbance. The patient is not nervous/anxious.     PHYSICAL EXAMINATION:  Blood pressure 128/71, pulse 89, temperature  97.9 F (36.6 C), temperature source Temporal, resp. rate 20, height 5\' 8"  (1.727 m), weight 173 lb 14.4 oz (78.9 kg), SpO2 100 %.  ECOG PERFORMANCE STATUS: 1 - Symptomatic but completely ambulatory  Physical Exam  Constitutional: Oriented to person, place, and time and well-developed, well-nourished, and in no distress. No distress.  HENT:  Head: Normocephalic and atraumatic.  Mouth/Throat: Oropharynx is clear and moist. No oropharyngeal exudate.  Eyes: Conjunctivae are normal. Right eye exhibits no discharge. Left eye exhibits no discharge. No scleral icterus.  Neck: Normal range of motion. Neck supple.  Cardiovascular: Normal rate, regular rhythm, normal heart sounds and intact distal pulses.   Pulmonary/Chest: Effort normal and breath sounds normal. No respiratory distress. No wheezes. No rales.  Abdominal: Soft. Bowel sounds are normal. Exhibits no distension and no mass. There is no tenderness.  Musculoskeletal: Normal range of motion. Exhibits no edema.  Lymphadenopathy:    No cervical adenopathy.  Neurological: Alert and oriented to person, place, and time. Exhibits normal muscle tone. Gait normal. Coordination normal.  Skin: Skin is warm and dry. No rash noted. Not diaphoretic. No erythema. No pallor.  Psychiatric: Mood, memory and judgment normal.  Vitals reviewed.  LABORATORY DATA: Lab Results  Component Value Date   WBC 19.7 (H)  11/06/2019   HGB 13.6 11/06/2019   HCT 42.5 11/06/2019   MCV 88.0 11/06/2019   PLT 318 11/06/2019      Chemistry      Component Value Date/Time   NA 137 11/06/2019 1245   NA 141 03/08/2017 1707   K 5.2 (H) 11/06/2019 1245   CL 100 11/06/2019 1245   CO2 26 11/06/2019 1245   BUN 17 11/06/2019 1245   BUN 12 03/08/2017 1707   CREATININE 0.91 11/06/2019 1245      Component Value Date/Time   CALCIUM 9.6 11/06/2019 1245   ALKPHOS 81 11/06/2019 1245   AST 17 11/06/2019 1245   ALT 27 11/06/2019 1245   BILITOT 0.3 11/06/2019 1245        RADIOGRAPHIC STUDIES:  CT Head Wo Contrast  Result Date: 10/18/2019 CLINICAL DATA:  Acute headache.  Dizziness.  Normal neuro exam EXAM: CT HEAD WITHOUT CONTRAST CT CERVICAL SPINE WITHOUT CONTRAST TECHNIQUE: Multidetector CT imaging of the head and cervical spine was performed following the standard protocol without intravenous contrast. Multiplanar CT image reconstructions of the cervical spine were also generated. COMPARISON:  None. FINDINGS: CT HEAD FINDINGS Brain: Large area of low-density in the left cerebellum measuring 3 cm. The low-density is central with sparing of the folia. There is mass-effect on the fourth ventricle. No obstructive hydrocephalus. Ventricle size normal. Both cerebral hemispheres normal. No acute hemorrhage. Vascular: Negative for hyperdense vessel Skull: Negative Sinuses/Orbits: Mucosal edema right maxillary sinus otherwise clear sinuses. Negative orbit. Other: None CT CERVICAL SPINE FINDINGS Alignment: Normal alignment with straightening of the cervical lordosis. Skull base and vertebrae: Negative for fracture or mass Soft tissues and spinal canal: No soft tissue mass or swelling. Disc levels: Mild disc degeneration and spurring at C3-4 and C4-5. Moderate disc degeneration and spurring at C5-6 with mild left foraminal stenosis. Moderate disc degeneration and spurring C6-7 with moderate foraminal stenosis bilaterally left greater than right and mild spinal stenosis. Upper chest: Lung apices clear bilaterally. Other: None IMPRESSION: 1. Large low-density lesion in the left cerebellum measuring approximately 3 cm. Favor tumor. Infarct considered less likely. No obstructive hydrocephalus. Recommend MRI brain without with contrast 2. Cervical spondylosis as above. 3. These results were called by telephone at the time of interpretation on 10/18/2019 at 10:23 am to provider [facility] , who verbally acknowledged these results. Electronically Signed   By: Franchot Gallo M.D.   On:  10/18/2019 10:23   CT Chest W Contrast  Result Date: 10/18/2019 CLINICAL DATA:  Intermittent dizziness with headaches for 2 weeks. Rim enhancing left cerebellar mass. Evaluate for metastatic disease. EXAM: CT CHEST, ABDOMEN, AND PELVIS WITH CONTRAST TECHNIQUE: Multidetector CT imaging of the chest, abdomen and pelvis was performed following the standard protocol during bolus administration of intravenous contrast. CONTRAST:  154mL OMNIPAQUE IOHEXOL 300 MG/ML  SOLN COMPARISON:  MR head 10/18/2019. Chest radiographs 03/10/2017 and abdominopelvic CT 06/15/2016. FINDINGS: CT CHEST FINDINGS Cardiovascular: Mild atherosclerosis of the aorta, great vessels and coronary arteries. No evidence of acute pulmonary embolism or other acute vascular findings. The heart size is normal. There is a small amount of pericardial fluid versus thickening anteriorly. Mediastinum/Nodes: There are no enlarged mediastinal, hilar or axillary lymph nodes. There is a small subcarinal node measuring 9 mm short axis on image 38/2. The thyroid gland, trachea and esophagus appear normal. Lungs/Pleura: No pleural effusion or pneumothorax. Mild centrilobular and paraseptal emphysema. There is a large right lower lobe infrahilar mass with heterogeneous enhancement highly suspicious for bronchogenic carcinoma.  This measures up to 6.5 x 5.3 cm on image 50/2. This mass abuts the IVC, although does not invade it. The mass also abuts the right hemidiaphragm medially. There is anterior tenting of the right major fissure inferiorly without definite invasion of the right middle lobe. No other suspicious pulmonary nodules. Musculoskeletal/Chest wall: No chest wall mass or suspicious osseous findings. A hemangiomas noted in the right aspect of the T3 vertebral body. CT ABDOMEN AND PELVIS FINDINGS Hepatobiliary: 1.7 x 1.2 cm low-density lesion in the left hepatic lobe on image 64/2 is unchanged from the 2018 CT, consistent with a benign finding. No new or  enlarging hepatic lesions. No evidence of gallstones, gallbladder wall thickening or biliary dilatation. Pancreas: Unremarkable. No pancreatic ductal dilatation or surrounding inflammatory changes. Spleen: Normal in size without focal abnormality. Adrenals/Urinary Tract: Both adrenal glands appear normal. The kidneys appear normal without evidence of urinary tract calculus, suspicious lesion or hydronephrosis. No bladder abnormalities are seen. Stomach/Bowel: No evidence of bowel wall thickening, distention or surrounding inflammatory change. Mild gastric wall thickening appears similar to the previous study. The appendix appears normal. Vascular/Lymphatic: There are no enlarged abdominal or pelvic lymph nodes. Aortic and branch vessel atherosclerosis. Retroaortic left renal vein. Reproductive: The prostate gland and seminal vesicles appear normal. Other: Intact abdominal wall.  No ascites or peritoneal nodularity. Musculoskeletal: No acute or significant osseous findings. IMPRESSION: 1. Large right lower lobe infrahilar lung mass highly suspicious for bronchogenic carcinoma. This mass tents the right major fissure inferiorly in protrudes into the right middle lobe. 2. No definite metastatic disease within the chest, abdomen or pelvis. 3. Stable chronic left hepatic lobe lesion, consistent with a benign finding. 4. Aortic Atherosclerosis (ICD10-I70.0) and Emphysema (ICD10-J43.9). Electronically Signed   By: Richardean Sale M.D.   On: 10/18/2019 15:38   CT Cervical Spine Wo Contrast  Result Date: 10/18/2019 CLINICAL DATA:  Acute headache.  Dizziness.  Normal neuro exam EXAM: CT HEAD WITHOUT CONTRAST CT CERVICAL SPINE WITHOUT CONTRAST TECHNIQUE: Multidetector CT imaging of the head and cervical spine was performed following the standard protocol without intravenous contrast. Multiplanar CT image reconstructions of the cervical spine were also generated. COMPARISON:  None. FINDINGS: CT HEAD FINDINGS Brain: Large  area of low-density in the left cerebellum measuring 3 cm. The low-density is central with sparing of the folia. There is mass-effect on the fourth ventricle. No obstructive hydrocephalus. Ventricle size normal. Both cerebral hemispheres normal. No acute hemorrhage. Vascular: Negative for hyperdense vessel Skull: Negative Sinuses/Orbits: Mucosal edema right maxillary sinus otherwise clear sinuses. Negative orbit. Other: None CT CERVICAL SPINE FINDINGS Alignment: Normal alignment with straightening of the cervical lordosis. Skull base and vertebrae: Negative for fracture or mass Soft tissues and spinal canal: No soft tissue mass or swelling. Disc levels: Mild disc degeneration and spurring at C3-4 and C4-5. Moderate disc degeneration and spurring at C5-6 with mild left foraminal stenosis. Moderate disc degeneration and spurring C6-7 with moderate foraminal stenosis bilaterally left greater than right and mild spinal stenosis. Upper chest: Lung apices clear bilaterally. Other: None IMPRESSION: 1. Large low-density lesion in the left cerebellum measuring approximately 3 cm. Favor tumor. Infarct considered less likely. No obstructive hydrocephalus. Recommend MRI brain without with contrast 2. Cervical spondylosis as above. 3. These results were called by telephone at the time of interpretation on 10/18/2019 at 10:23 am to provider [facility] , who verbally acknowledged these results. Electronically Signed   By: Franchot Gallo M.D.   On: 10/18/2019 10:23   MR  Brain W Wo Contrast  Result Date: 11/07/2019 CLINICAL DATA:  Brain/CNS neoplasm, staging. EXAM: MRI HEAD WITHOUT AND WITH CONTRAST TECHNIQUE: Multiplanar, multiecho pulse sequences of the brain and surrounding structures were obtained without and with intravenous contrast. CONTRAST:  35mL MULTIHANCE GADOBENATE DIMEGLUMINE 529 MG/ML IV SOLN COMPARISON:  10/18/2019 MRI head. FINDINGS: Brain: Increased size of peripherally enhancing, centrally necrotic 2.4 x 2.3  cm left cerebellar mass (11:36) with associated peripheral restricted diffusion. Perilesional edema involving the left greater than right cerebellum with partial effacement of the fourth ventricle is grossly unchanged. New punctate high left frontal enhancement involving the precentral gyrus (11:136). Scattered T2/FLAIR supratentorial white matter foci are nonspecific however commonly associated with chronic microvascular ischemic changes. No acute infarct or intracranial hemorrhage. No midline shift, ventriculomegaly or extra-axial fluid collection. Vascular: Normal flow voids. Right frontal developmental venous anomaly. Skull and upper cervical spine: Normal marrow signal. Sinuses/Orbits: Normal orbits. Right maxillary sinus mucous retention cyst. Trace bilateral mastoid effusions. Other: None. IMPRESSION: Increased size of 2.4 cm left cerebellar metastasis. New punctate left precentral gyrus enhancement may be vascular in origin. Continued attention on follow-up. Mild chronic microvascular ischemic changes. Electronically Signed   By: Primitivo Gauze M.D.   On: 11/07/2019 17:01   MR Brain W and Wo Contrast  Result Date: 10/18/2019 CLINICAL DATA:  Dizziness.  Brain lesion on CT. EXAM: MRI HEAD WITHOUT AND WITH CONTRAST TECHNIQUE: Multiplanar, multiecho pulse sequences of the brain and surrounding structures were obtained without and with intravenous contrast. CONTRAST:  3mL GADAVIST GADOBUTROL 1 MMOL/ML IV SOLN COMPARISON:  CT head 10/18/2019 FINDINGS: Brain: Left cerebellar mass lesion demonstrates rim enhancement and central necrosis. There is a moderate to large amount of surrounding white matter edema with mass-effect on the fourth ventricle. No obstruction of the ventricles. The wall of the mass does show some mild restricted diffusion but the central necrotic area does not. No other mass lesions identified. Small developmental venous anomaly in the right frontal lobe. Ventricle size is normal.  Negative for acute infarct. Mild chronic microvascular ischemic change in the white matter. Vascular: Normal arterial flow voids Skull and upper cervical spine: No focal skeletal lesion. Sinuses/Orbits: Mild mucosal edema paranasal sinuses. Negative orbit. Other: None IMPRESSION: 2 cm rim enhancing mass left cerebellar with surrounding white matter edema. No second lesion identified. This is most consistent with metastatic disease and workup for malignancy is recommended. Brain abscess considered less likely due to lack of central restricted diffusion and location. Electronically Signed   By: Franchot Gallo M.D.   On: 10/18/2019 12:26   CT Abdomen Pelvis W Contrast  Result Date: 10/18/2019 CLINICAL DATA:  Intermittent dizziness with headaches for 2 weeks. Rim enhancing left cerebellar mass. Evaluate for metastatic disease. EXAM: CT CHEST, ABDOMEN, AND PELVIS WITH CONTRAST TECHNIQUE: Multidetector CT imaging of the chest, abdomen and pelvis was performed following the standard protocol during bolus administration of intravenous contrast. CONTRAST:  138mL OMNIPAQUE IOHEXOL 300 MG/ML  SOLN COMPARISON:  MR head 10/18/2019. Chest radiographs 03/10/2017 and abdominopelvic CT 06/15/2016. FINDINGS: CT CHEST FINDINGS Cardiovascular: Mild atherosclerosis of the aorta, great vessels and coronary arteries. No evidence of acute pulmonary embolism or other acute vascular findings. The heart size is normal. There is a small amount of pericardial fluid versus thickening anteriorly. Mediastinum/Nodes: There are no enlarged mediastinal, hilar or axillary lymph nodes. There is a small subcarinal node measuring 9 mm short axis on image 38/2. The thyroid gland, trachea and esophagus appear normal. Lungs/Pleura: No pleural effusion or  pneumothorax. Mild centrilobular and paraseptal emphysema. There is a large right lower lobe infrahilar mass with heterogeneous enhancement highly suspicious for bronchogenic carcinoma. This measures up  to 6.5 x 5.3 cm on image 50/2. This mass abuts the IVC, although does not invade it. The mass also abuts the right hemidiaphragm medially. There is anterior tenting of the right major fissure inferiorly without definite invasion of the right middle lobe. No other suspicious pulmonary nodules. Musculoskeletal/Chest wall: No chest wall mass or suspicious osseous findings. A hemangiomas noted in the right aspect of the T3 vertebral body. CT ABDOMEN AND PELVIS FINDINGS Hepatobiliary: 1.7 x 1.2 cm low-density lesion in the left hepatic lobe on image 64/2 is unchanged from the 2018 CT, consistent with a benign finding. No new or enlarging hepatic lesions. No evidence of gallstones, gallbladder wall thickening or biliary dilatation. Pancreas: Unremarkable. No pancreatic ductal dilatation or surrounding inflammatory changes. Spleen: Normal in size without focal abnormality. Adrenals/Urinary Tract: Both adrenal glands appear normal. The kidneys appear normal without evidence of urinary tract calculus, suspicious lesion or hydronephrosis. No bladder abnormalities are seen. Stomach/Bowel: No evidence of bowel wall thickening, distention or surrounding inflammatory change. Mild gastric wall thickening appears similar to the previous study. The appendix appears normal. Vascular/Lymphatic: There are no enlarged abdominal or pelvic lymph nodes. Aortic and branch vessel atherosclerosis. Retroaortic left renal vein. Reproductive: The prostate gland and seminal vesicles appear normal. Other: Intact abdominal wall.  No ascites or peritoneal nodularity. Musculoskeletal: No acute or significant osseous findings. IMPRESSION: 1. Large right lower lobe infrahilar lung mass highly suspicious for bronchogenic carcinoma. This mass tents the right major fissure inferiorly in protrudes into the right middle lobe. 2. No definite metastatic disease within the chest, abdomen or pelvis. 3. Stable chronic left hepatic lobe lesion, consistent with a  benign finding. 4. Aortic Atherosclerosis (ICD10-I70.0) and Emphysema (ICD10-J43.9). Electronically Signed   By: Richardean Sale M.D.   On: 10/18/2019 15:38   NM PET Image Initial (PI) Skull Base To Thigh  Result Date: 11/14/2019 CLINICAL DATA:  Subsequent treatment strategy for non-small-cell lung cancer. EXAM: NUCLEAR MEDICINE PET SKULL BASE TO THIGH TECHNIQUE: 8.7 mCi F-18 FDG was injected intravenously. Full-ring PET imaging was performed from the skull base to thigh after the radiotracer. CT data was obtained and used for attenuation correction and anatomic localization. Fasting blood glucose: 102 mg/dl COMPARISON:  Chest abdomen pelvis CT 10/18/2019. FINDINGS: Mediastinal blood pool activity: SUV max 1.6 Liver activity: SUV max NA NECK: No hypermetabolic lymph nodes in the neck. Incidental CT findings: none CHEST: Diffuse thyroid uptake is symmetric. The large right infrahilar mass lesion is markedly hypermetabolic with SUV max = 35.3. The hypermetabolic mass extends up into the inferior right hilum but no definite separate or discrete hypermetabolic right lymphadenopathy. 9 mm short axis subcarinal node identified on previous CT scan shows no hypermetabolism but may be below size threshold for resolution on PET imaging. No other sites of unexpected hypermetabolic disease in the chest. Incidental CT findings: Coronary artery calcification is evident. Centrilobular emphsyema noted. Mild bilateral gynecomastia. ABDOMEN/PELVIS: No abnormal hypermetabolic activity within the liver, pancreas, adrenal glands, or spleen. No hypermetabolic lymph nodes in the abdomen or pelvis. Incidental CT findings: There is abdominal aortic atherosclerosis without aneurysm. SKELETON: No focal hypermetabolic activity to suggest skeletal metastasis. Incidental CT findings: none IMPRESSION: 1. Markedly hypermetabolic right infrahilar mass lesion, consistent with the patient's known primary neoplasm. No evidence for hypermetabolic  metastatic disease in the neck, chest, abdomen, or pelvis. 2.  Emphysema. (ICD10-J43.9) 3.  Aortic Atherosclerois (ICD10-170.0) Electronically Signed   By: Misty Stanley M.D.   On: 11/14/2019 10:43   DG Chest Port 1 View  Result Date: 11/05/2019 CLINICAL DATA:  Post bronchoscopy EXAM: PORTABLE CHEST 1 VIEW COMPARISON:  10/24/2019 FINDINGS: The heart size and mediastinal contours are within normal limits. Both lungs are clear. The visualized skeletal structures are unremarkable. No pneumothorax. IMPRESSION: No active disease. Electronically Signed   By: Rolm Baptise M.D.   On: 11/05/2019 08:59   DG Chest Port 1 View  Result Date: 10/24/2019 CLINICAL DATA:  Status post bronchoscopy. EXAM: PORTABLE CHEST 1 VIEW COMPARISON:  PA and lateral chest 03/10/2017. CT chest, abdomen and pelvis 10/18/2019. FINDINGS: No pneumothorax. The lungs are emphysematous. Opacity in the medial aspect of the right lung base correlates with the mass seen on the prior CT. Heart size is normal. IMPRESSION: Negative for pneumothorax after bronchoscopy. Right lower lobe mass as seen on prior CT. Atherosclerosis. Electronically Signed   By: Inge Rise M.D.   On: 10/24/2019 15:26   CT Super D Chest Wo Contrast  Result Date: 11/03/2019 CLINICAL DATA:  Preop for bronchoscopic biopsy of a right lower lobe lung mass. Known brain lesion. EXAM: CT CHEST WITHOUT CONTRAST TECHNIQUE: Multidetector CT imaging of the chest was performed using thin slice collimation for electromagnetic bronchoscopy planning purposes, without intravenous contrast. COMPARISON:  Chest CT 10/18/2019 FINDINGS: Cardiovascular: The heart is normal in size. Small amount of pericardial fluid but no overt effusion. The aorta is normal in caliber. Scattered atherosclerotic calcifications. Scattered three-vessel coronary artery calcifications and aortic valve calcifications. Mediastinum/Nodes: No mediastinal or hilar mass or adenopathy. The esophagus is grossly normal.  Lungs/Pleura: Stable appearing 5.5 cm right infrahilar/lower lobe lung mass. No other pulmonary nodules to suggest pulmonary metastatic disease. Stable underlying emphysematous changes. Upper Abdomen: No significant upper abdominal findings are identified. No obvious hepatic or adrenal gland metastasis. Musculoskeletal: No significant bony findings. IMPRESSION: 1. Stable 5.5 cm right infrahilar/lower lobe lung mass. 2. No findings for pulmonary metastatic disease. 3. Stable emphysematous changes. 4. Emphysema and aortic atherosclerosis. Aortic Atherosclerosis (ICD10-I70.0) and Emphysema (ICD10-J43.9). Electronically Signed   By: Marijo Sanes M.D.   On: 11/03/2019 16:42   DG C-ARM BRONCHOSCOPY  Result Date: 11/05/2019 C-ARM BRONCHOSCOPY: Fluoroscopy was utilized by the requesting physician.  No radiographic interpretation.   DG C-ARM BRONCHOSCOPY  Result Date: 10/24/2019 C-ARM BRONCHOSCOPY: Fluoroscopy was utilized by the requesting physician.  No radiographic interpretation.     ASSESSMENT/PLAN:  This is a very pleasant 59 year old Caucasian male diagnosed with stage IV non-small cell lung cancer, adenocarcinoma.  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 He was diagnosed in July 2021. His molecular studies by [facility] are pending.   The patient will complete SRS to the solidary brain metastasis under the care of Dr. Lisbeth Renshaw later today on 11/14/19.   Dr. Julien Nordmann reviewed the patient's PET scan with him and his wife.  Dr. Julien Nordmann had a lengthly discussion with the patient today about his current condition and treatment options.  Considering that there is no definitive adenopathy in the chest, Dr. Julien Nordmann recommends treating the patient with weekly concurrent chemoradiation with carboplatin for an AUC of 2 and paclitaxel 45 mg/m.  The patient is interested in this option and he is expected to receive his first dose  of treatment on 11/25/2019.  We Discussed the adverse side effects of treatment were  discussed including but not limited to alopecia, myelosuppression, nausea, vomiting, liver, and kidney dysfunction.  I will arrange for a chemo education class prior to receiving his first cycle of treatment.  I have sent a prescription for Compazine 10 mg p.o. every 6 hours as needed for nausea to the patient's pharmacy.  We will see the patient back for follow-up visit in 2 and half weeks starting from cycle #2 treatment.  The patient has several questions regarding short-term disability paperwork.  I have reached out to the social worker who will get in touch with the patient to further evaluate his needs.  The patient has very poor venous access.  He is scheduled to have a Port-A-Cath placed next week.  EMLA cream was sent to his pharmacy.  I discussed with the patient and his wife how to go about using the port and EMLA cream.  I will reach out to radiation oncology to help coordinate his appointments for chemo XRT.  The patient was advised to call immediately if he has any concerning symptoms in the interval. The patient voices understanding of current disease status and treatment options and is in agreement with the current care plan. All questions were answered. The patient knows to call the clinic with any problems, questions or concerns. We can certainly see the patient much sooner if necessary      Orders Placed This Encounter  Procedures  . CMP ([facility] only)    Standing Status:   Standing    Number of Occurrences:   6    Standing Expiration Date:   11/13/2020  . CBC with Differential (Cancer Center Only)    Standing Status:   Standing    Number of Occurrences:   6    Standing Expiration Date:   11/13/2020     Tobe Sos Derral Colucci, PA-C 11/14/19   ADDENDUM: Hematology/Oncology Attending: I had a face-to-face encounter with the patient today.  I recommended his care plan.   This is a very pleasant 58 years old white male who comes to the clinic accompanied by his wife for evaluation and discussion of his treatment options.  The patient was diagnosed in July 2021 with metastatic adenocarcinoma of the lung presented with solitary cerebellar brain metastasis status post SRS, last fraction will be given today. The patient had a PET scan performed recently.  I personally and independently reviewed the PET scan images and discussed the results with the patient and his wife.  The PET scan showed no evidence for metastatic disease except for the large right infrahilar mass. I had a lengthy discussion with the patient about his condition.  I explained to the patient that the standard treatment for stage IV non-small cell lung cancer will be systemic therapy versus palliative care. He understand that he has incurable condition. The tissue material were insufficient for molecular studies but we sent a blood test to [facility] for molecular studies. Because of the solitary brain metastasis as well as the solitary right infrahilar mass, I recommended for the patient to consider a course of concurrent chemoradiation to the locally advanced disease in the chest after he completed the SRS to the brain. Depending on the molecular studies we may consider the patient for targeted therapy or additional systemic chemotherapy plus/minus immunotherapy in the future. I discussed with the patient the adverse effect of this treatment including but not limited to alopecia, myelosuppression, nausea and vomiting, peripheral neuropathy, liver or renal dysfunction. He will start the first cycle of  concurrent chemoradiation with weekly carboplatin for AUC of 2 and paclitaxel 45 mg/M2 on November 25, 2019. The patient will come back for follow-up visit at that time for evaluation and management of any adverse effect of his treatment. We will arrange for the patient to have a Port-A-Cath placement for IV  access. He will have a chemotherapy education class before starting the first dose of his treatment and we will send the prescription for Compazine 10 mg p.o. every 6 hours as needed for nausea to his pharmacy. He was advised to call immediately if he has any concerning symptoms in the interval. Disclaimer: This note was dictated with voice recognition software. Similar sounding words can inadvertently be transcribed and may be missed upon review. Eilleen Kempf, MD 11/14/19

## 2019-11-14 ENCOUNTER — Telehealth: Payer: Self-pay | Admitting: Physician Assistant

## 2019-11-14 ENCOUNTER — Ambulatory Visit
Admission: RE | Admit: 2019-11-14 | Discharge: 2019-11-14 | Disposition: A | Discharge status: Home / Self Care | Payer: Managed Care, Other (non HMO) | Source: Ambulatory Visit | Attending: Radiation Oncology | Admitting: Radiation Oncology

## 2019-11-14 ENCOUNTER — Other Ambulatory Visit: Payer: Self-pay | Admitting: Internal Medicine

## 2019-11-14 ENCOUNTER — Encounter: Payer: Self-pay | Admitting: Physician Assistant

## 2019-11-14 ENCOUNTER — Other Ambulatory Visit: Payer: Self-pay

## 2019-11-14 ENCOUNTER — Telehealth: Payer: Self-pay | Admitting: General Practice

## 2019-11-14 ENCOUNTER — Encounter: Payer: Self-pay | Admitting: Radiation Oncology

## 2019-11-14 ENCOUNTER — Inpatient Hospital Stay (HOSPITAL_BASED_OUTPATIENT_CLINIC_OR_DEPARTMENT_OTHER): Payer: Managed Care, Other (non HMO) | Admitting: Physician Assistant

## 2019-11-14 VITALS — BP 128/71 | HR 89 | Temp 97.9°F | Resp 20 | Ht 68.0 in | Wt 173.9 lb

## 2019-11-14 DIAGNOSIS — C3431 Malignant neoplasm of lower lobe, right bronchus or lung: Secondary | ICD-10-CM | POA: Diagnosis not present

## 2019-11-14 DIAGNOSIS — C7931 Secondary malignant neoplasm of brain: Secondary | ICD-10-CM | POA: Diagnosis not present

## 2019-11-14 DIAGNOSIS — C3491 Malignant neoplasm of unspecified part of right bronchus or lung: Secondary | ICD-10-CM | POA: Diagnosis not present

## 2019-11-14 DIAGNOSIS — Z7189 Other specified counseling: Secondary | ICD-10-CM | POA: Insufficient documentation

## 2019-11-14 NOTE — Patient Instructions (Addendum)
-  There are two main categories of lung cancer, they are named based on the size of the cancer cell. One is called Non-Small cell lung cancer. The other type is Small Cell Lung Cancer -The sample (biopsy) that they took of your tumor was consistent with a subtype of Non-small cell lung cancer called Adenocarcinoma.  -We covered a lot of important information at your appointment today regarding what the treatment plan is moving forward. Here are the the main points that were discussed at your office visit with [name] today:  -The treatment that you will receive consists of two chemotherapy drugs called Carboplatin and Paclitaxel (also called Taxol) -We are planning on starting your treatment next week on 11/25/19 but before your start your treatment, I would like you to attend a Chemotherapy Education Class. This involves having you sit down with one of our nurse educators. She will discuss with you one-on-one more details about your treatment as well as general information about resources here at [facility] will be given every week for about 6 weeks or so (as long as you are also receiving radiation). We will check your labs (blood work) once a week . Dr. Julien Nordmann or I will see you every other treatment just to make sure that you are doing well and that everything is on track. -We will get a CT scan about 3 weeks after you complete your treatment  Medications:  -Compazine was sent to your pharmacy. This medication is for nausea. You may take this every 6 hours as needed if you feel nausous.   Follow Up:  -We will see you back for a follow up visit in 2 weeks and me or mohamed will see you every other visit.

## 2019-11-14 NOTE — Progress Notes (Signed)
  Name: Joseph Atkins  MRN: [id number]  Date: 11/14/2019   DOB: July 09, 1960  Stereotactic Radiosurgery Operative Note  PRE-OPERATIVE DIAGNOSIS:  Solitary Brain Metastasis  POST-OPERATIVE DIAGNOSIS:  Solitary Brain Metastasis  PROCEDURE:  Stereotactic Radiosurgery  SURGEON:  Earleen Newport, MD  NARRATIVE: The patient underwent a radiation treatment planning session in the radiation oncology simulation suite under the care of the radiation oncology physician and physicist.  I participated closely in the radiation treatment planning afterwards. The patient underwent planning CT which was fused to 3T high resolution MRI with 1 mm axial slices.  These images were fused on the planning system.  Radiation oncology contoured the gross target volume and subsequently expanded this to yield the Planning Target Volume. I actively participated in the planning process.  I helped to define and review the target contours and also the contours of the optic pathway, eyes, brainstem and selected nearby organs at risk.  All the dose constraints for critical structures were reviewed and compared to AAPM Task Group 101.  The prescription dose conformity was reviewed.  I approved the plan electronically.    Accordingly, [name] was brought to the TrueBeam stereotactic radiation treatment linac and placed in the custom immobilization mask.  The patient was aligned according to the IR fiducial markers with Brain[facility] Exactrac, then orthogonal x-rays were used in ExacTrac with the 6DOF robotic table and the shifts were made to align the patient  This lesion was complex because it was 3.5 cm or greater, adjacent (within 45mm)  within the brainstem is complex. It was treated with 4 arcs delivering a total of 18 GY in a single fraction.  [name] received stereotactic radiosurgery uneventfully.  The detailed description of the procedure is recorded in the radiation oncology procedure note.  I was present for  the duration of the procedure.  DISPOSITION:  Following delivery, the patient was transported to nursing in stable condition and monitored for possible acute effects to be discharged to home in stable condition with follow-up in one month.  Earleen Newport, MD 11/14/2019 2:51 PM

## 2019-11-14 NOTE — Telephone Encounter (Signed)
[facility] CSW Progress Notes  Call to patient at request of PA C Heilingotter.  Patient has questions about short and long term disability.  Unable to reach patient, left VM w request to return call and CSW can explain how to access employer sponsored and./or government sponsored disability.  Edwyna Shell, LCSW Clinical Social Worker Phone:  [phone] Cell:   [phone]

## 2019-11-14 NOTE — Telephone Encounter (Signed)
Scheduled per 7/22 los. Printed avs and calendar for pt.  

## 2019-11-15 NOTE — Progress Notes (Signed)
I spoke with the patient and his wife prior to his [facility] treatment. We reviewed his PET scan results and the limited disease in the chest. Dr. Lisbeth Renshaw and Dr. Julien Nordmann recommend treatment of his chest with a more aggressive approach given that he only has limited disease in the chest and the single lesion in the brain. He is in agreement, and Dr. Lisbeth Renshaw recommends chemoRT over 6 1/2 weeks. We reviewed the risks, benefits, short, and long term effects of radiotherapy as well as logistics and delivery. Written consent is obtained and placed in the chart, a copy was provided to the patient. He will simulate today following SRS so that he can begin chemoRT on 11/25/19.     Carola Rhine, PAC

## 2019-11-17 ENCOUNTER — Other Ambulatory Visit: Payer: Self-pay | Admitting: Radiology

## 2019-11-18 ENCOUNTER — Encounter: Payer: Self-pay | Admitting: *Deleted

## 2019-11-18 ENCOUNTER — Other Ambulatory Visit: Payer: Self-pay

## 2019-11-18 ENCOUNTER — Encounter: Payer: Self-pay | Admitting: Radiation Oncology

## 2019-11-18 ENCOUNTER — Inpatient Hospital Stay: Payer: Managed Care, Other (non HMO)

## 2019-11-18 ENCOUNTER — Other Ambulatory Visit: Payer: Self-pay | Admitting: Physician Assistant

## 2019-11-18 ENCOUNTER — Telehealth: Payer: Self-pay | Admitting: Radiation Oncology

## 2019-11-18 DIAGNOSIS — C3491 Malignant neoplasm of unspecified part of right bronchus or lung: Secondary | ICD-10-CM

## 2019-11-18 MED ORDER — PROCHLORPERAZINE MALEATE 10 MG PO TABS: 10.0000 mg | ORAL_TABLET | Freq: Four times a day (QID) | ORAL | 2 refills | Status: DC | PRN

## 2019-11-18 NOTE — Progress Notes (Signed)
[facility] Clinical Social Work  Hol[employer] representative contacted patient at home to offer support with disability questions and concerns.  Patient stated the paperwork had been completed, and he was now receiving his checks.  Patient stated he had short term and long term disability benefits through his employer.  Patent stated he was hopeful to return to work before he needed to transition to long term disability.  Patient expressed the need for a gas card.  Patient is scheduled to meet with the financial advocate on Monday August 2 to apply for the [employer].  CSW encouraged patient to bring his proof of income.  CSW team will also schedule to see patient and discuss the sherrill fund.    Johnnye Lana, MSW, LCSW, OSW-C Clinical Social Worker [facility] [phone]

## 2019-11-18 NOTE — Telephone Encounter (Signed)
Phoned patient making him aware the letter he requested has been printed, place in an envelope and will be awaiting his pick up at the front desk tomorrow. Patient verbalized understanding and appreciation for the call.

## 2019-11-19 ENCOUNTER — Encounter (HOSPITAL_COMMUNITY): Payer: Self-pay

## 2019-11-19 ENCOUNTER — Ambulatory Visit (HOSPITAL_COMMUNITY)
Admission: RE | Admit: 2019-11-19 | Discharge: 2019-11-19 | Disposition: A | Discharge status: Home / Self Care | Payer: Managed Care, Other (non HMO) | Source: Ambulatory Visit | Attending: Radiation Oncology | Admitting: Radiation Oncology

## 2019-11-19 ENCOUNTER — Telehealth: Payer: Self-pay | Admitting: *Deleted

## 2019-11-19 ENCOUNTER — Other Ambulatory Visit: Payer: Self-pay | Admitting: Physician Assistant

## 2019-11-19 ENCOUNTER — Ambulatory Visit (HOSPITAL_COMMUNITY)
Admission: RE | Admit: 2019-11-19 | Discharge: 2019-11-19 | Disposition: A | Discharge status: Home / Self Care | Payer: Managed Care, Other (non HMO) | Source: Ambulatory Visit | Attending: Physician Assistant | Admitting: Physician Assistant

## 2019-11-19 DIAGNOSIS — Z79899 Other long term (current) drug therapy: Secondary | ICD-10-CM | POA: Diagnosis not present

## 2019-11-19 DIAGNOSIS — K589 Irritable bowel syndrome without diarrhea: Secondary | ICD-10-CM | POA: Diagnosis not present

## 2019-11-19 DIAGNOSIS — Z79890 Hormone replacement therapy (postmenopausal): Secondary | ICD-10-CM | POA: Insufficient documentation

## 2019-11-19 DIAGNOSIS — C3431 Malignant neoplasm of lower lobe, right bronchus or lung: Secondary | ICD-10-CM | POA: Insufficient documentation

## 2019-11-19 DIAGNOSIS — E039 Hypothyroidism, unspecified: Secondary | ICD-10-CM | POA: Diagnosis not present

## 2019-11-19 DIAGNOSIS — F17210 Nicotine dependence, cigarettes, uncomplicated: Secondary | ICD-10-CM | POA: Insufficient documentation

## 2019-11-19 DIAGNOSIS — K219 Gastro-esophageal reflux disease without esophagitis: Secondary | ICD-10-CM | POA: Insufficient documentation

## 2019-11-19 DIAGNOSIS — C7931 Secondary malignant neoplasm of brain: Secondary | ICD-10-CM | POA: Insufficient documentation

## 2019-11-19 DIAGNOSIS — E785 Hyperlipidemia, unspecified: Secondary | ICD-10-CM | POA: Diagnosis not present

## 2019-11-19 HISTORY — PX: IR IMAGING GUIDED PORT INSERTION: IMG5740

## 2019-11-19 HISTORY — DX: Malignant (primary) neoplasm, unspecified: C80.1

## 2019-11-19 LAB — BASIC METABOLIC PANEL
Anion gap: 9 (ref 5–15)
BUN: 19 mg/dL (ref 6–20)
CO2: 26 mmol/L (ref 22–32)
Calcium: 8.8 mg/dL — ABNORMAL LOW (ref 8.9–10.3)
Chloride: 103 mmol/L (ref 98–111)
Creatinine, Ser: 0.65 mg/dL (ref 0.61–1.24)
GFR calc Af Amer: >60 mL/min (ref >60)
GFR calc non Af Amer: >60 mL/min (ref >60)
Glucose, Bld: 118 mg/dL — ABNORMAL HIGH (ref 70–99)
Potassium: 4.4 mmol/L (ref 3.5–5.1)
Sodium: 138 mmol/L (ref 135–145)

## 2019-11-19 LAB — CBC WITH DIFFERENTIAL/PLATELET
Abs Immature Granulocytes: 0.89 10*3/uL — ABNORMAL HIGH (ref 0.00–0.07)
Basophils Absolute: 0.1 10*3/uL (ref 0.0–0.1)
Basophils Relative: 1 %
Eosinophils Absolute: 0 10*3/uL (ref 0.0–0.5)
Eosinophils Relative: 0 %
HCT: 43.8 % (ref 39.0–52.0)
Hemoglobin: 13.9 g/dL (ref 13.0–17.0)
Immature Granulocytes: 5 %
Lymphocytes Relative: 10 %
Lymphs Abs: 1.6 10*3/uL (ref 0.7–4.0)
MCH: 29.4 pg (ref 26.0–34.0)
MCHC: 31.7 g/dL (ref 30.0–36.0)
MCV: 92.8 fL (ref 80.0–100.0)
Monocytes Absolute: 0.7 10*3/uL (ref 0.1–1.0)
Monocytes Relative: 4 %
Neutro Abs: 13.7 10*3/uL — ABNORMAL HIGH (ref 1.7–7.7)
Neutrophils Relative %: 80 %
Platelets: 295 10*3/uL (ref 150–400)
RBC: 4.72 MIL/uL (ref 4.22–5.81)
RDW: 17 % — ABNORMAL HIGH (ref 11.5–15.5)
WBC: 17 10*3/uL — ABNORMAL HIGH (ref 4.0–10.5)
nRBC: 0 % (ref 0.0–0.2)

## 2019-11-19 IMAGING — XA IR IMAGING GUIDED PORT INSERTION
2 series · 3 of 3 positions shown · non-contrast
Comparison: none

INDICATION: 58-year-old male with metastatic right lower lobe non-small cell
bronchogenic carcinoma. He presents for port catheter placement.

[Series 1: (id) · 2 of 2 slices shown]
[im 1/2]
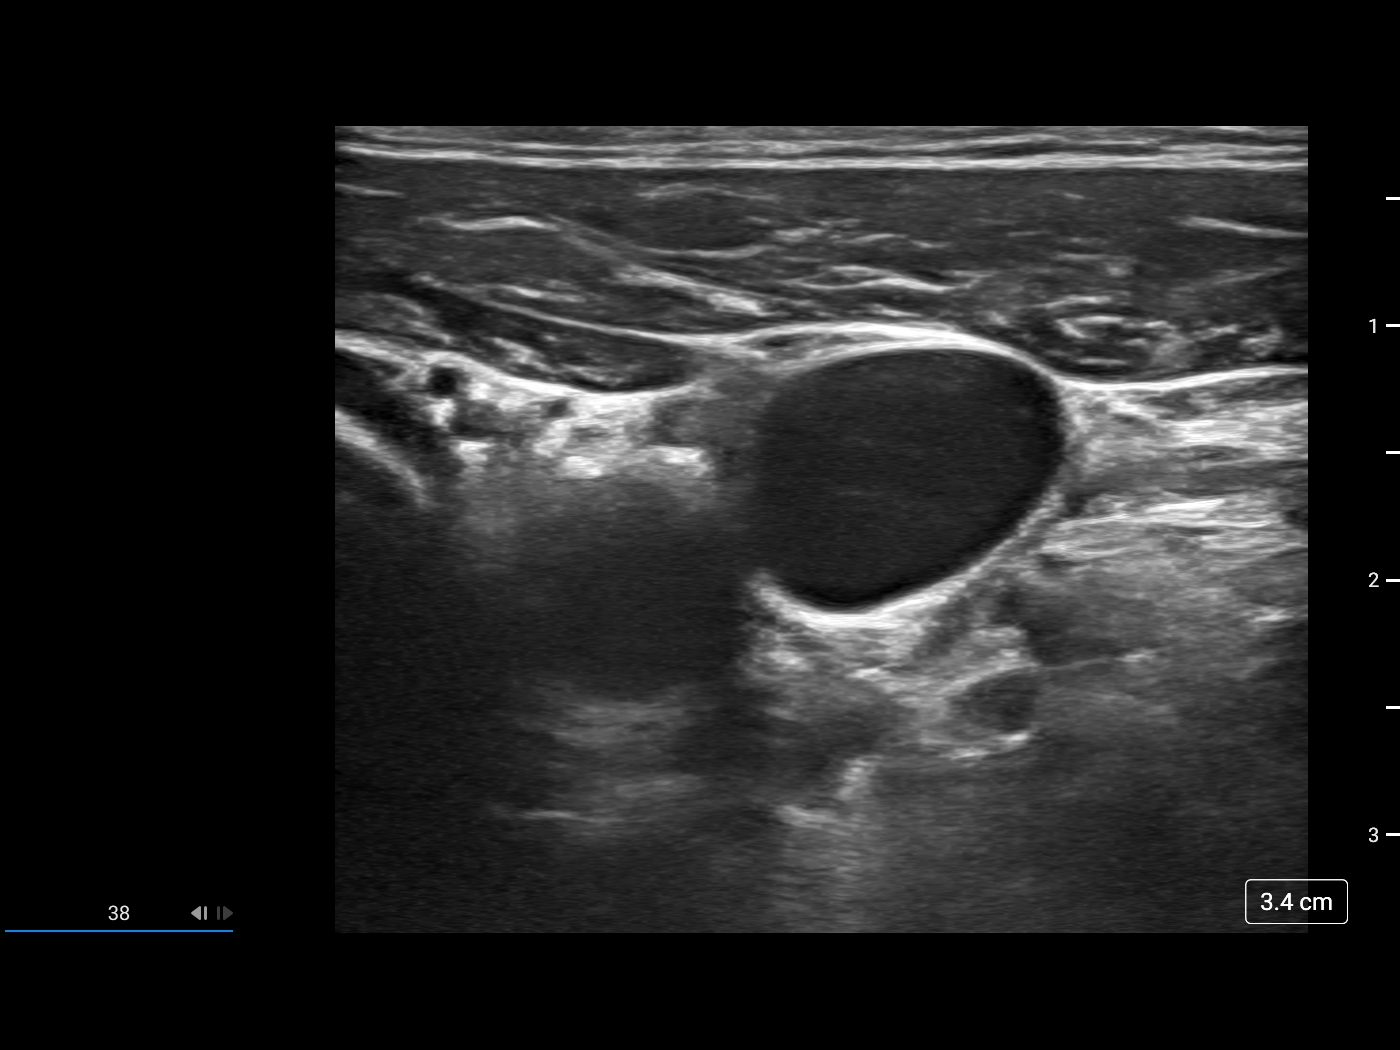
[im 2/2]
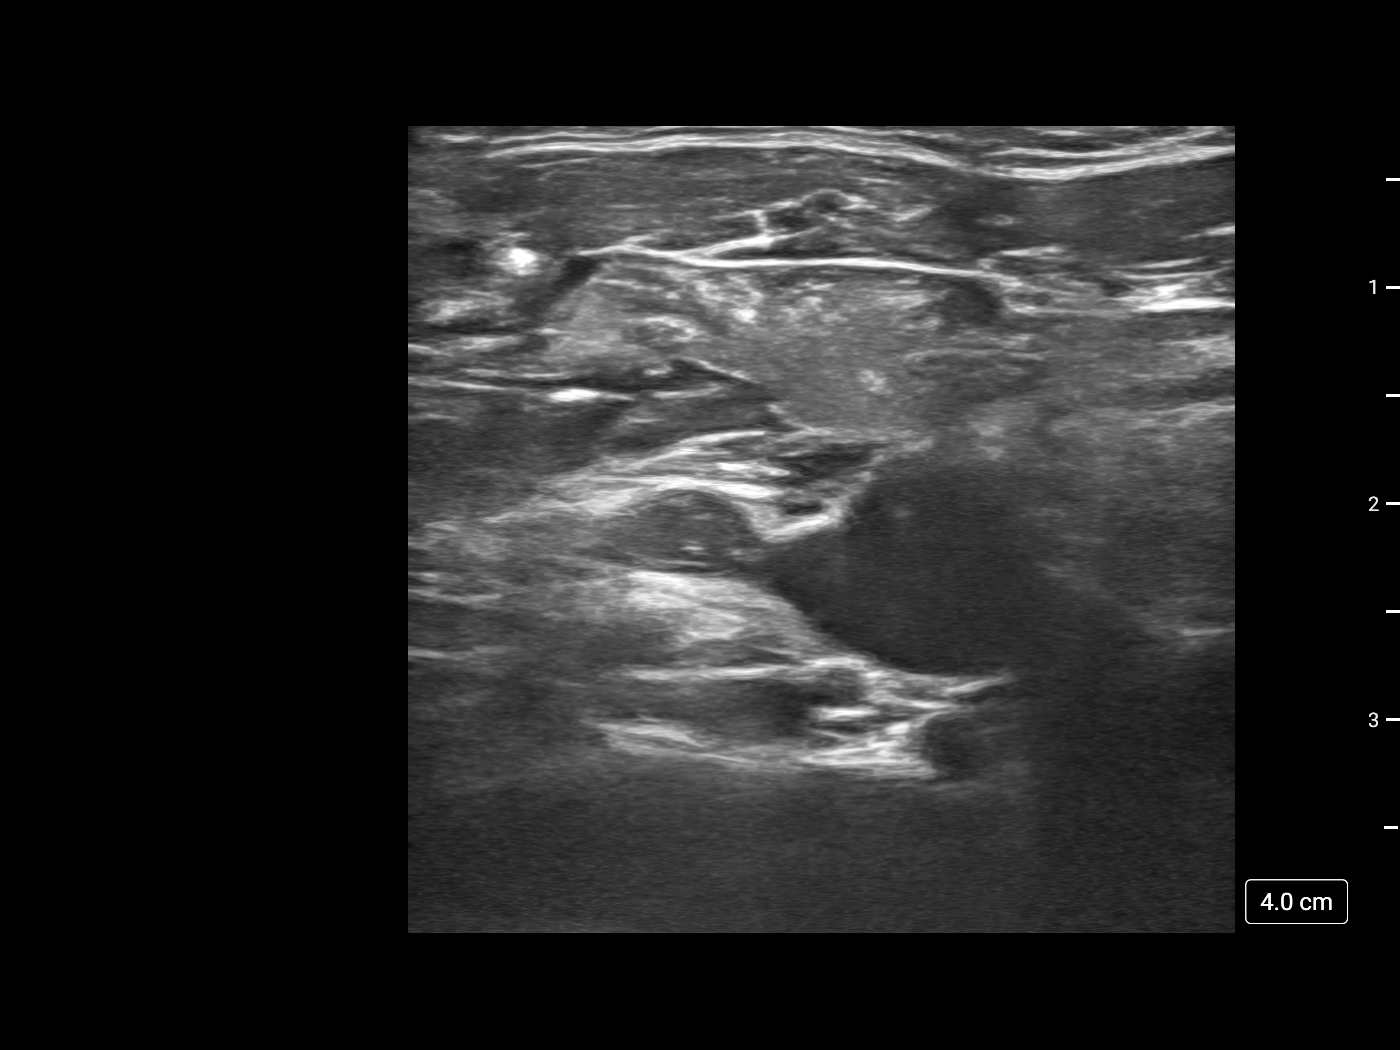

[Series 300: ir perc tun perit cath w/port s&i /imag · 1 of 1 slices shown]
[im 1/1]
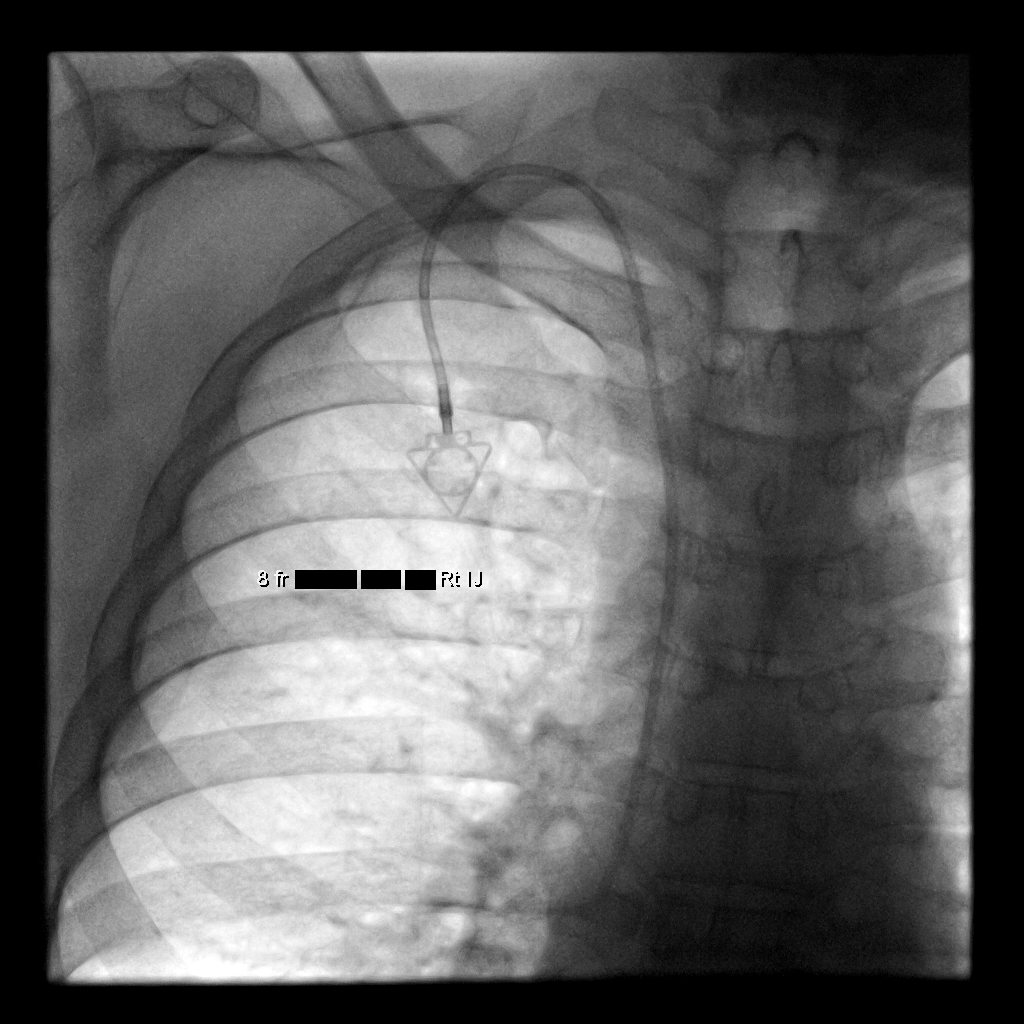

[3 of 3 positions shown; findings below may reference images not displayed]

EXAM:
IMPLANTED PORT A CATH PLACEMENT WITH ULTRASOUND AND FLUOROSCOPIC
GUIDANCE

MEDICATIONS:
900 mL clindamycin; The antibiotic was administered within an
appropriate time interval prior to skin puncture.

ANESTHESIA/SEDATION:
Versed 3 mg IV; Fentanyl 100 mcg IV;

Moderate Sedation Time:  21 minutes

The patient was continuously monitored during the procedure by the
interventional radiology nurse under my direct supervision.

FLUOROSCOPY TIME:  0 minutes, 12 seconds (1 mGy)

COMPLICATIONS:
None immediate.

PROCEDURE:
The right neck and chest was prepped with chlorhexidine, and draped
in the usual sterile fashion using maximum barrier technique (cap
and mask, sterile gown, sterile gloves, large sterile sheet, hand
hygiene and cutaneous antiseptic). Local anesthesia was attained by
infiltration with 1% lidocaine with epinephrine.

Ultrasound demonstrated patency of the right internal jugular vein,
and this was documented with an image. Under real-time ultrasound
guidance, this vein was accessed with a 21 gauge micropuncture
needle and image documentation was performed. A small dermatotomy
was made at the access site with an 11 scalpel. A 0.018" wire was
advanced into the SVC and the access needle exchanged for a 4F
micropuncture vascular sheath. The 0.018" wire was then removed and
a 0.035" wire advanced into the IVC.



The venous access site was then serially dilated and a peel away
vascular sheath placed over the wire. The wire was removed and the
port catheter advanced into position under fluoroscopic guidance.
The catheter tip is positioned in the superior cavoatrial junction.
This was documented with a spot image. The portacatheter was then
tested and found to flush and aspirate well. The port was flushed
with saline followed by 100 units/mL heparinized saline.

The pocket was then closed in two layers using first subdermal
inverted interrupted absorbable sutures followed by a running
subcuticular suture. The epidermis was then sealed with Dermabond.
The dermatotomy at the venous access site was also closed with
Dermabond.
IMPRESSION: Successful placement of a right IJ approach Power Port with
ultrasound and fluoroscopic guidance. The catheter is ready for use.

## 2019-11-19 MED ORDER — CLINDAMYCIN PHOSPHATE 900 MG/50ML IV SOLN: 900.0000 mg | Freq: Once | INTRAVENOUS | Status: AC

## 2019-11-19 MED ORDER — FENTANYL CITRATE (PF) 100 MCG/2ML IJ SOLN
INTRAMUSCULAR | Status: AC
  Filled 2019-11-19: qty 2

## 2019-11-19 MED ORDER — LIDOCAINE-EPINEPHRINE 1 %-1:100000 IJ SOLN
INTRAMUSCULAR | Status: AC | PRN
  Administered 2019-11-19: 10 mL

## 2019-11-19 MED ORDER — HEPARIN SOD (PORK) LOCK FLUSH 100 UNIT/ML IV SOLN
INTRAVENOUS | Status: AC | PRN
  Administered 2019-11-19: 500 [IU] via INTRAVENOUS

## 2019-11-19 MED ORDER — MIDAZOLAM HCL 2 MG/2ML IJ SOLN
INTRAMUSCULAR | Status: AC | PRN
  Administered 2019-11-19 (×2): 1 mg via INTRAVENOUS

## 2019-11-19 MED ORDER — LIDOCAINE-EPINEPHRINE 1 %-1:100000 IJ SOLN
INTRAMUSCULAR | Status: AC
  Filled 2019-11-19: qty 1

## 2019-11-19 MED ORDER — HEPARIN SOD (PORK) LOCK FLUSH 100 UNIT/ML IV SOLN
INTRAVENOUS | Status: AC
  Filled 2019-11-19: qty 5

## 2019-11-19 MED ORDER — MIDAZOLAM HCL 2 MG/2ML IJ SOLN
INTRAMUSCULAR | Status: AC
  Filled 2019-11-19: qty 4

## 2019-11-19 MED ORDER — CLINDAMYCIN PHOSPHATE 900 MG/50ML IV SOLN
INTRAVENOUS | Status: AC
  Administered 2019-11-19: 900 mg via INTRAVENOUS
  Filled 2019-11-19: qty 50

## 2019-11-19 MED ORDER — FENTANYL CITRATE (PF) 100 MCG/2ML IJ SOLN
INTRAMUSCULAR | Status: AC | PRN
  Administered 2019-11-19: 50 ug via INTRAVENOUS

## 2019-11-19 MED ORDER — SODIUM CHLORIDE 0.9 % IV SOLN: INTRAVENOUS | Status: DC

## 2019-11-19 NOTE — Telephone Encounter (Signed)
[facility] results received. Copy to scan to chart and copy to Dr. Julien Nordmann

## 2019-11-19 NOTE — Consult Note (Signed)
Chief Complaint: Patient was seen in consultation today for Port-A-Cath placement  Referring Physician(s): [name],M  Supervising Physician: Jacqulynn Cadet  Patient Status: [facility] - Out-pt  History of Present Illness: Joseph Atkins is a 59 y.o. male smoker with history of recently diagnosed stage IV lung adenocarcinoma who presented with a right lower lobe/infrahilar mass as well as solitary brain metastasis in the left cerebellum.  He also has poor venous access and presents today for Port-A-Cath placement for chemotherapy.  He has undergone SRS to the solitary brain met.  Additional history as below.   Past Medical History:  Diagnosis Date  . Cancer ([place])    lung cancer  . Diverticulosis   . GERD (gastroesophageal reflux disease)   . Hx of small bowel obstruction   . Hyperlipidemia   . Hypothyroidism   . IBS (irritable bowel syndrome)   . Substance abuse ([facility])    Alcoholic, Drug addition  . Thyroid disease     Past Surgical History:  Procedure Laterality Date  . arm surgery Right   . BRONCHIAL BRUSHINGS  10/24/2019   Procedure: BRONCHIAL BRUSHINGS;  Surgeon: Collene Gobble, MD;  Location: [facility] ENDOSCOPY;  Service: Cardiopulmonary;;  right lower lobe   . BRONCHIAL BRUSHINGS  11/05/2019   Procedure: BRONCHIAL BRUSHINGS;  Surgeon: Collene Gobble, MD;  Location: [facility] ENDOSCOPY;  Service: Pulmonary;;  . BRONCHIAL NEEDLE ASPIRATION BIOPSY  10/24/2019   Procedure: BRONCHIAL NEEDLE ASPIRATION BIOPSIES;  Surgeon: Collene Gobble, MD;  Location: [place];  Service: Cardiopulmonary;;  . BRONCHIAL NEEDLE ASPIRATION BIOPSY  11/05/2019   Procedure: BRONCHIAL NEEDLE ASPIRATION BIOPSIES;  Surgeon: Collene Gobble, MD;  Location: [facility] ENDOSCOPY;  Service: Pulmonary;;  . ENDOBRONCHIAL ULTRASOUND N/A 10/24/2019   Procedure: ENDOBRONCHIAL ULTRASOUND;  Surgeon: Collene Gobble, MD;  Location: [place];  Service: Cardiopulmonary;  Laterality: N/A;  . FINGER SURGERY Right    Middle  .  VIDEO BRONCHOSCOPY N/A 10/24/2019   Procedure: VIDEO BRONCHOSCOPY WITHOUT FLUORO;  Surgeon: Collene Gobble, MD;  Location: [facility] ENDOSCOPY;  Service: Cardiopulmonary;  Laterality: N/A;  . VIDEO BRONCHOSCOPY WITH ENDOBRONCHIAL NAVIGATION N/A 11/05/2019   Procedure: VIDEO BRONCHOSCOPY WITH ENDOBRONCHIAL NAVIGATION;  Surgeon: Collene Gobble, MD;  Location: [place] ENDOSCOPY;  Service: Pulmonary;  Laterality: N/A;    Allergies: Penicillins  Medications: Prior to Admission medications   Medication Sig Start Date End Date Taking? Authorizing Provider  albuterol (PROVENTIL HFA;VENTOLIN HFA) 108 (90 Base) MCG/ACT inhaler Inhale 2 puffs into the lungs every 6 (six) hours as needed for wheezing or shortness of breath. 02/07/17  Yes Dettinger, Fransisca Kaufmann, MD  cyclobenzaprine (FLEXERIL) 10 MG tablet Take 10 mg by mouth 2 (two) times daily as needed for muscle spasms. 10/15/19  Yes [provider]  dexamethasone (DECADRON) 4 MG tablet Take 1 tablet (4 mg total) by mouth 2 (two) times daily with a meal. 11/08/19  Yes Bruning, Ashlyn, PA-C  docusate sodium (COLACE) 100 MG capsule Take 100 mg by mouth daily.   Yes [provider]  fluticasone furoate-vilanterol (BREO ELLIPTA) 100-25 MCG/INH AEPB Inhale 1 puff into the lungs daily. 05/25/17  Yes Dettinger, Fransisca Kaufmann, MD  ibuprofen (ADVIL) 800 MG tablet Take 800 mg by mouth 3 (three) times daily. 10/14/19  Yes [provider]  levothyroxine (SYNTHROID) 75 MCG tablet Take 75 mcg by mouth daily. 07/27/19  Yes [provider]  omeprazole (PRILOSEC) 40 MG capsule Take 1 capsule (40 mg total) by mouth daily. 02/07/17  Yes Dettinger, Fransisca Kaufmann,  MD  oxyCODONE-acetaminophen (PERCOCET/ROXICET) 5-325 MG tablet Take 1 tablet by mouth every 4 (four) hours as needed for severe pain. 11/08/19  Yes Bruning, Ashlyn, PA-C  polyethylene glycol powder (MIRALAX) powder Take 17 g by mouth daily. 05/09/14  Yes Lysbeth Penner, FNP  TRULANCE 3 MG TABS Take 1 tablet by  mouth daily. 09/06/19  Yes [provider]  lidocaine-prilocaine (EMLA) cream Apply 1 application topically as needed. 11/11/19   Heilingoetter, Cassandra L, PA-C  ondansetron (ZOFRAN) 4 MG tablet Take 4 mg by mouth every 8 (eight) hours as needed. 10/15/19   [provider]  prochlorperazine (COMPAZINE) 10 MG tablet Take 1 tablet (10 mg total) by mouth every 6 (six) hours as needed. 11/18/19   Heilingoetter, Cassandra L, PA-C     Family History  Problem Relation Age of Onset  . Epilepsy Mother   . Heart disease Mother   . Heart disease Brother   . Lung cancer Paternal Uncle     Social History   Socioeconomic History  . Marital status: Married    Spouse name: Not on file  . Number of children: 2  . Years of education: Not on file  . Highest education level: Not on file  Occupational History  . Occupation: Buyer, retail: [employer]  Tobacco Use  . Smoking status: Current Every Day Smoker    Packs/day: 1.50    Types: Cigarettes  . Smokeless tobacco: Former Systems developer    Types: Secondary school teacher  . Vaping Use: Never used  Substance and Sexual Activity  . Alcohol use: No    Comment: Alcoholic clean for 6 years  . Drug use: No    Comment: Recovering Drug Addict-clean for 6 years  . Sexual activity: Not on file  Other Topics Concern  . Not on file  Social History Narrative  . Not on file   Social Determinants of Health   Financial Resource Strain: Medium Risk  . Difficulty of Paying Living Expenses: Somewhat hard  Food Insecurity: No Food Insecurity  . Worried About Charity fundraiser in the Last Year: Never true  . Ran Out of Food in the Last Year: Never true  Transportation Needs: No Transportation Needs  . Lack of Transportation (Medical): No  . Lack of Transportation (Non-Medical): No  Physical Activity: Insufficiently Active  . Days of Exercise per Week: 5 days  . Minutes of Exercise per Session: 20 min  Stress: Stress Concern Present   . Feeling of Stress : Very much  Social Connections: Moderately Isolated  . Frequency of Communication with Friends and Family: More than three times a week  . Frequency of Social Gatherings with Friends and Family: More than three times a week  . Attends Religious Services: Never  . Active Member of Clubs or Organizations: No  . Attends Archivist Meetings: Never  . Marital Status: Married      Review of Systems denies fever, headache, chest pain, worsening dyspnea, cough, abdominal/back pain, nausea, vomiting or bleeding  Vital Signs: BP 118/74 (BP Location: Right Arm)   Atkins 77   Temp 98.6 F (37 C) (Oral)   Resp 18   SpO2 100%   Physical Exam awake, alert.  Chest with distant breath sounds bilaterally, occasional wheeze noted.  Heart with regular rate and rhythm.  Abdomen soft, positive bowel sounds, nontender.  No lower extremity edema.  Imaging: MR Brain W Wo Contrast  Result Date: 11/07/2019 CLINICAL DATA:  Brain/CNS  neoplasm, staging. EXAM: MRI HEAD WITHOUT AND WITH CONTRAST TECHNIQUE: Multiplanar, multiecho Atkins sequences of the brain and surrounding structures were obtained without and with intravenous contrast. CONTRAST:  80mL MULTIHANCE GADOBENATE DIMEGLUMINE 529 MG/ML IV SOLN COMPARISON:  10/18/2019 MRI head. FINDINGS: Brain: Increased size of peripherally enhancing, centrally necrotic 2.4 x 2.3 cm left cerebellar mass (11:36) with associated peripheral restricted diffusion. Perilesional edema involving the left greater than right cerebellum with partial effacement of the fourth ventricle is grossly unchanged. New punctate high left frontal enhancement involving the precentral gyrus (11:136). Scattered T2/FLAIR supratentorial white matter foci are nonspecific however commonly associated with chronic microvascular ischemic changes. No acute infarct or intracranial hemorrhage. No midline shift, ventriculomegaly or extra-axial fluid collection. Vascular: Normal flow  voids. Right frontal developmental venous anomaly. Skull and upper cervical spine: Normal marrow signal. Sinuses/Orbits: Normal orbits. Right maxillary sinus mucous retention cyst. Trace bilateral mastoid effusions. Other: None. IMPRESSION: Increased size of 2.4 cm left cerebellar metastasis. New punctate left precentral gyrus enhancement may be vascular in origin. Continued attention on follow-up. Mild chronic microvascular ischemic changes. Electronically Signed   By: Stana Bunting M.D.   On: 11/07/2019 17:01   NM PET Image Initial (PI) Skull Base To Thigh  Result Date: 11/14/2019 CLINICAL DATA:  Subsequent treatment strategy for non-small-cell lung cancer. EXAM: NUCLEAR MEDICINE PET SKULL BASE TO THIGH TECHNIQUE: 8.7 mCi F-18 FDG was injected intravenously. Full-ring PET imaging was performed from the skull base to thigh after the radiotracer. CT data was obtained and used for attenuation correction and anatomic localization. Fasting blood glucose: 102 mg/dl COMPARISON:  Chest abdomen pelvis CT 10/18/2019. FINDINGS: Mediastinal blood pool activity: SUV max 1.6 Liver activity: SUV max NA NECK: No hypermetabolic lymph nodes in the neck. Incidental CT findings: none CHEST: Diffuse thyroid uptake is symmetric. The large right infrahilar mass lesion is markedly hypermetabolic with SUV max = 13.0. The hypermetabolic mass extends up into the inferior right hilum but no definite separate or discrete hypermetabolic right lymphadenopathy. 9 mm short axis subcarinal node identified on previous CT scan shows no hypermetabolism but may be below size threshold for resolution on PET imaging. No other sites of unexpected hypermetabolic disease in the chest. Incidental CT findings: Coronary artery calcification is evident. Centrilobular emphsyema noted. Mild bilateral gynecomastia. ABDOMEN/PELVIS: No abnormal hypermetabolic activity within the liver, pancreas, adrenal glands, or spleen. No hypermetabolic lymph nodes in  the abdomen or pelvis. Incidental CT findings: There is abdominal aortic atherosclerosis without aneurysm. SKELETON: No focal hypermetabolic activity to suggest skeletal metastasis. Incidental CT findings: none IMPRESSION: 1. Markedly hypermetabolic right infrahilar mass lesion, consistent with the patient's known primary neoplasm. No evidence for hypermetabolic metastatic disease in the neck, chest, abdomen, or pelvis. 2.  Emphysema. (ICD10-J43.9) 3.  Aortic Atherosclerois (ICD10-170.0) Electronically Signed   By: Kennith Center M.D.   On: 11/14/2019 10:43   DG Chest Port 1 View  Result Date: 11/05/2019 CLINICAL DATA:  Post bronchoscopy EXAM: PORTABLE CHEST 1 VIEW COMPARISON:  10/24/2019 FINDINGS: The heart size and mediastinal contours are within normal limits. Both lungs are clear. The visualized skeletal structures are unremarkable. No pneumothorax. IMPRESSION: No active disease. Electronically Signed   By: Charlett Nose M.D.   On: 11/05/2019 08:59   DG Chest Port 1 View  Result Date: 10/24/2019 CLINICAL DATA:  Status post bronchoscopy. EXAM: PORTABLE CHEST 1 VIEW COMPARISON:  PA and lateral chest 03/10/2017. CT chest, abdomen and pelvis 10/18/2019. FINDINGS: No pneumothorax. The lungs are emphysematous. Opacity in the medial  aspect of the right lung base correlates with the mass seen on the prior CT. Heart size is normal. IMPRESSION: Negative for pneumothorax after bronchoscopy. Right lower lobe mass as seen on prior CT. Atherosclerosis. Electronically Signed   By: Inge Rise M.D.   On: 10/24/2019 15:26   CT Super D Chest Wo Contrast  Result Date: 11/03/2019 CLINICAL DATA:  Preop for bronchoscopic biopsy of a right lower lobe lung mass. Known brain lesion. EXAM: CT CHEST WITHOUT CONTRAST TECHNIQUE: Multidetector CT imaging of the chest was performed using thin slice collimation for electromagnetic bronchoscopy planning purposes, without intravenous contrast. COMPARISON:  Chest CT 10/18/2019  FINDINGS: Cardiovascular: The heart is normal in size. Small amount of pericardial fluid but no overt effusion. The aorta is normal in caliber. Scattered atherosclerotic calcifications. Scattered three-vessel coronary artery calcifications and aortic valve calcifications. Mediastinum/Nodes: No mediastinal or hilar mass or adenopathy. The esophagus is grossly normal. Lungs/Pleura: Stable appearing 5.5 cm right infrahilar/lower lobe lung mass. No other pulmonary nodules to suggest pulmonary metastatic disease. Stable underlying emphysematous changes. Upper Abdomen: No significant upper abdominal findings are identified. No obvious hepatic or adrenal gland metastasis. Musculoskeletal: No significant bony findings. IMPRESSION: 1. Stable 5.5 cm right infrahilar/lower lobe lung mass. 2. No findings for pulmonary metastatic disease. 3. Stable emphysematous changes. 4. Emphysema and aortic atherosclerosis. Aortic Atherosclerosis (ICD10-I70.0) and Emphysema (ICD10-J43.9). Electronically Signed   By: Marijo Sanes M.D.   On: 11/03/2019 16:42   DG C-ARM BRONCHOSCOPY  Result Date: 11/05/2019 C-ARM BRONCHOSCOPY: Fluoroscopy was utilized by the requesting physician.  No radiographic interpretation.   DG C-ARM BRONCHOSCOPY  Result Date: 10/24/2019 C-ARM BRONCHOSCOPY: Fluoroscopy was utilized by the requesting physician.  No radiographic interpretation.    Labs:  CBC: Recent Labs    10/18/19 1205 11/05/19 0601 11/06/19 1245  WBC 12.1* 18.3* 19.7*  HGB 13.2 14.2 13.6  HCT 42.5 45.2 42.5  PLT 372 311 318    COAGS: Recent Labs    11/05/19 0601  INR 1.0  APTT 29    BMP: Recent Labs    10/18/19 1121 11/05/19 0601 11/06/19 1245  NA 134* 136 137  K 3.9 4.7 5.2*  CL 95* 102 100  CO2 '28 23 26  '$ GLUCOSE 109* 104* 107*  BUN '14 11 17  '$ CALCIUM 9.2 9.0 9.6  CREATININE 0.84 0.74 0.91  GFRNONAA >60 >60 >60  GFRAA >60 >60 >60    LIVER FUNCTION TESTS: Recent Labs    10/18/19 1121 11/05/19 0601  11/06/19 1245  BILITOT 0.7 0.6 0.3  AST '25 22 17  '$ ALT 32 31 27  ALKPHOS 90 77 81  PROT 8.5* 6.8 7.1  ALBUMIN 4.6 3.6 3.5    TUMOR MARKERS: No results for input(s): AFPTM, CEA, CA199, CHROMGRNA in the last 8760 hours.  Assessment and Plan: 59 y.o. male smoker with history of recently diagnosed stage IV lung adenocarcinoma who presented with a right lower lobe/infrahilar mass as well as solitary brain metastasis in the left cerebellum.  He also has poor venous access and presents today for Port-A-Cath placement for chemotherapy.  He has undergone SRS to the solitary brain met.Risks and benefits of image guided port-a-catheter placement was discussed with the patient including, but not limited to bleeding, infection, pneumothorax, or fibrin sheath development and need for additional procedures.  All of the patient's questions were answered, patient is agreeable to proceed. Consent signed and in chart.     Thank you for this interesting consult.  I greatly enjoyed meeting Budd  L Ashmead and look forward to participating in their care.  A copy of this report was sent to the requesting provider on this date.  Electronically Signed: D. Rowe Robert, PA-C 11/19/2019, 12:47 PM   I spent a total of 25 minutes  in face to face in clinical consultation, greater than 50% of which was counseling/coordinating care for Port-A-Cath placement

## 2019-11-19 NOTE — Progress Notes (Signed)
Called patient's wife and went over discharge instructions with wife and patient for his new port a cath. They both verbalize understanding and know they will get written instructions on his new port.

## 2019-11-19 NOTE — Discharge Instructions (Signed)
No lidocaine cream for about 2 weeks . Use ice instead.   Implanted Port Insertion, Care After This sheet gives you information about how to care for yourself after your procedure. Your health care provider may also give you more specific instructions. If you have problems or questions, contact your health care provider. What can I expect after the procedure? After the procedure, it is common to have:  Discomfort at the port insertion site.  Bruising on the skin over the port. This should improve over 3-4 days. Follow these instructions at home: [facility] care  After your port is placed, you will get a manufacturer's information card. The card has information about your port. Keep this card with you at all times.  Take care of the port as told by your health care provider. Ask your health care provider if you or a family member can get training for taking care of the port at home. A home health care nurse may also take care of the port.  Make sure to remember what type of port you have. Incision care      Follow instructions from your health care provider about how to take care of your port insertion site. Make sure you: ? Wash your hands with soap and water before and after you change your bandage (dressing). If soap and water are not available, use hand sanitizer. ? You may remove your dressing 11/20/19 around 3 PM and shower. No baths.  Check your port insertion site every day for signs of infection. Check for: ? Redness, swelling, or pain. ? Fluid or blood. ? Warmth. ? Pus or a bad smell. Activity  Return to your normal activities as told by your health care provider. Ask your health care provider what activities are safe for you.  Do not lift anything that is heavier than 10 lb (4.5 kg), or the limit that you are told, until your health care provider says that it is safe. General instructions  Take over-the-counter and prescription medicines only as told by your health care  provider.  Do not take baths, swim, or use a hot tub until your health care provider approves. Ask your health care provider if you may take showers. You may only be allowed to take sponge baths.  Do not drive for 24 hours if you were given a sedative during your procedure.  Wear a medical alert bracelet in case of an emergency. This will tell any health care providers that you have a port.  Keep all follow-up visits as told by your health care provider. This is important. Contact a health care provider if:  You cannot flush your port with saline as directed, or you cannot draw blood from the port.  You have a fever or chills.  You have redness, swelling, or pain around your port insertion site.  You have fluid or blood coming from your port insertion site.  Your port insertion site feels warm to the touch.  You have pus or a bad smell coming from the port insertion site. Get help right away if:  You have chest pain or shortness of breath.  You have bleeding from your port that you cannot control. Summary  Take care of the port as told by your health care provider. Keep the manufacturer's information card with you at all times.  Change your dressing as told by your health care provider.  Contact a health care provider if you have a fever or chills or if you have redness,  swelling, or pain around your port insertion site.  Keep all follow-up visits as told by your health care provider. This information is not intended to replace advice given to you by your health care provider. Make sure you discuss any questions you have with your health care provider. Document Revised: 11/07/2017 Document Reviewed: 11/07/2017 Elsevier Patient Education  [place].        Moderate Conscious Sedation, Adult, Care After These instructions provide you with information about caring for yourself after your procedure. Your health care provider may also give you more specific  instructions. Your treatment has been planned according to current medical practices, but problems sometimes occur. Call your health care provider if you have any problems or questions after your procedure. What can I expect after the procedure? After your procedure, it is common:  To feel sleepy for several hours.  To feel clumsy and have poor balance for several hours.  To have poor judgment for several hours.  To vomit if you eat too soon. Follow these instructions at home: For at least 24 hours after the procedure:   Do not: ? Participate in activities where you could fall or become injured. ? Drive. ? Use heavy machinery. ? Drink alcohol. ? Take sleeping pills or medicines that cause drowsiness. ? Make important decisions or sign legal documents. ? Take care of children on your own.  Rest. Eating and drinking  Follow the diet recommended by your health care provider.  If you vomit: ? Drink water, juice, or soup when you can drink without vomiting. ? Make sure you have little or no nausea before eating solid foods. General instructions  Have a responsible adult stay with you until you are awake and alert.  Take over-the-counter and prescription medicines only as told by your health care provider.  If you smoke, do not smoke without supervision.  Keep all follow-up visits as told by your health care provider. This is important. Contact a health care provider if:  You keep feeling nauseous or you keep vomiting.  You feel light-headed.  You develop a rash.  You have a fever. Get help right away if:  You have trouble breathing. This information is not intended to replace advice given to you by your health care provider. Make sure you discuss any questions you have with your health care provider. Document Revised: 03/24/2017 Document Reviewed: 08/01/2015 Elsevier Patient Education  2020 [employer].

## 2019-11-19 NOTE — Procedures (Signed)
Interventional Radiology Procedure Note  Procedure: Placement of a right IJ approach single lumen PowerPort.  Tip is positioned at the superior cavoatrial junction and catheter is ready for immediate use.  Complications: No immediate Recommendations:  - Ok to shower tomorrow - Do not submerge for 7 days - Routine line care   Signed,  Catrina Fellenz K. Lakishia Bourassa, MD   

## 2019-11-20 ENCOUNTER — Encounter: Payer: Self-pay | Admitting: Internal Medicine

## 2019-11-20 DIAGNOSIS — C7931 Secondary malignant neoplasm of brain: Secondary | ICD-10-CM | POA: Diagnosis not present

## 2019-11-21 LAB — GUARDANT 360

## 2019-11-25 ENCOUNTER — Inpatient Hospital Stay: Payer: Managed Care, Other (non HMO)

## 2019-11-25 ENCOUNTER — Ambulatory Visit
Admission: RE | Admit: 2019-11-25 | Discharge: 2019-11-25 | Disposition: A | Discharge status: Home / Self Care | Payer: Managed Care, Other (non HMO) | Source: Ambulatory Visit | Attending: Radiation Oncology | Admitting: Radiation Oncology

## 2019-11-25 ENCOUNTER — Encounter: Payer: Self-pay | Admitting: Internal Medicine

## 2019-11-25 ENCOUNTER — Other Ambulatory Visit: Payer: Self-pay

## 2019-11-25 VITALS — BP 138/78 | HR 65 | Temp 97.8°F | Resp 18

## 2019-11-25 DIAGNOSIS — J439 Emphysema, unspecified: Secondary | ICD-10-CM | POA: Insufficient documentation

## 2019-11-25 DIAGNOSIS — E785 Hyperlipidemia, unspecified: Secondary | ICD-10-CM | POA: Insufficient documentation

## 2019-11-25 DIAGNOSIS — R1310 Dysphagia, unspecified: Secondary | ICD-10-CM | POA: Insufficient documentation

## 2019-11-25 DIAGNOSIS — Z5111 Encounter for antineoplastic chemotherapy: Secondary | ICD-10-CM | POA: Insufficient documentation

## 2019-11-25 DIAGNOSIS — Z79899 Other long term (current) drug therapy: Secondary | ICD-10-CM | POA: Insufficient documentation

## 2019-11-25 DIAGNOSIS — E039 Hypothyroidism, unspecified: Secondary | ICD-10-CM | POA: Insufficient documentation

## 2019-11-25 DIAGNOSIS — K589 Irritable bowel syndrome without diarrhea: Secondary | ICD-10-CM | POA: Insufficient documentation

## 2019-11-25 DIAGNOSIS — Z510 Encounter for antineoplastic radiation therapy: Secondary | ICD-10-CM | POA: Diagnosis not present

## 2019-11-25 DIAGNOSIS — C3431 Malignant neoplasm of lower lobe, right bronchus or lung: Secondary | ICD-10-CM | POA: Insufficient documentation

## 2019-11-25 DIAGNOSIS — C7931 Secondary malignant neoplasm of brain: Secondary | ICD-10-CM | POA: Insufficient documentation

## 2019-11-25 DIAGNOSIS — G4700 Insomnia, unspecified: Secondary | ICD-10-CM | POA: Insufficient documentation

## 2019-11-25 DIAGNOSIS — K219 Gastro-esophageal reflux disease without esophagitis: Secondary | ICD-10-CM | POA: Insufficient documentation

## 2019-11-25 DIAGNOSIS — C3491 Malignant neoplasm of unspecified part of right bronchus or lung: Secondary | ICD-10-CM

## 2019-11-25 LAB — CMP (CANCER CENTER ONLY)
ALT: 42 U/L (ref 0–44)
AST: 19 U/L (ref 15–41)
Albumin: 3.7 g/dL (ref 3.5–5.0)
Alkaline Phosphatase: 68 U/L (ref 38–126)
Anion gap: 11 (ref 5–15)
BUN: 15 mg/dL (ref 6–20)
CO2: 23 mmol/L (ref 22–32)
Calcium: 9.4 mg/dL (ref 8.9–10.3)
Chloride: 103 mmol/L (ref 98–111)
Creatinine: 0.88 mg/dL (ref 0.61–1.24)
GFR, Est AFR Am: >60 mL/min (ref >60)
GFR, Estimated: >60 mL/min (ref >60)
Glucose, Bld: 253 mg/dL — ABNORMAL HIGH (ref 70–99)
Potassium: 4.3 mmol/L (ref 3.5–5.1)
Sodium: 137 mmol/L (ref 135–145)
Total Bilirubin: 0.4 mg/dL (ref 0.3–1.2)
Total Protein: 6.9 g/dL (ref 6.5–8.1)

## 2019-11-25 LAB — CBC WITH DIFFERENTIAL (CANCER CENTER ONLY)
Abs Immature Granulocytes: 0.51 10*3/uL — ABNORMAL HIGH (ref 0.00–0.07)
Basophils Absolute: 0.1 10*3/uL (ref 0.0–0.1)
Basophils Relative: 0 %
Eosinophils Absolute: 0.1 10*3/uL (ref 0.0–0.5)
Eosinophils Relative: 0 %
HCT: 42.9 % (ref 39.0–52.0)
Hemoglobin: 14.2 g/dL (ref 13.0–17.0)
Immature Granulocytes: 3 %
Lymphocytes Relative: 9 %
Lymphs Abs: 1.9 10*3/uL (ref 0.7–4.0)
MCH: 30 pg (ref 26.0–34.0)
MCHC: 33.1 g/dL (ref 30.0–36.0)
MCV: 90.5 fL (ref 80.0–100.0)
Monocytes Absolute: 0.7 10*3/uL (ref 0.1–1.0)
Monocytes Relative: 3 %
Neutro Abs: 16.9 10*3/uL — ABNORMAL HIGH (ref 1.7–7.7)
Neutrophils Relative %: 85 %
Platelet Count: 277 10*3/uL (ref 150–400)
RBC: 4.74 MIL/uL (ref 4.22–5.81)
RDW: 17.4 % — ABNORMAL HIGH (ref 11.5–15.5)
WBC Count: 20.1 10*3/uL — ABNORMAL HIGH (ref 4.0–10.5)
nRBC: 0 % (ref 0.0–0.2)

## 2019-11-25 MED ORDER — PALONOSETRON HCL INJECTION 0.25 MG/5ML
0.2500 mg | Freq: Once | INTRAVENOUS | Status: AC
  Administered 2019-11-25: 0.25 mg via INTRAVENOUS

## 2019-11-25 MED ORDER — SODIUM CHLORIDE 0.9% FLUSH
10.0000 mL | INTRAVENOUS | Status: DC | PRN
  Administered 2019-11-25: 10 mL
  Filled 2019-11-25: qty 10

## 2019-11-25 MED ORDER — HEPARIN SOD (PORK) LOCK FLUSH 100 UNIT/ML IV SOLN
500.0000 [IU] | Freq: Once | INTRAVENOUS | Status: AC | PRN
  Administered 2019-11-25: 500 [IU]
  Filled 2019-11-25: qty 5

## 2019-11-25 MED ORDER — SODIUM CHLORIDE 0.9 % IV SOLN
20.0000 mg | Freq: Once | INTRAVENOUS | Status: AC
  Administered 2019-11-25: 20 mg via INTRAVENOUS
  Filled 2019-11-25: qty 20

## 2019-11-25 MED ORDER — PALONOSETRON HCL INJECTION 0.25 MG/5ML
INTRAVENOUS | Status: AC
  Filled 2019-11-25: qty 5

## 2019-11-25 MED ORDER — SODIUM CHLORIDE 0.9 % IV SOLN
45.0000 mg/m2 | Freq: Once | INTRAVENOUS | Status: AC
  Administered 2019-11-25: 90 mg via INTRAVENOUS
  Filled 2019-11-25: qty 15

## 2019-11-25 MED ORDER — SODIUM CHLORIDE 0.9 % IV SOLN
Freq: Once | INTRAVENOUS | Status: AC
  Filled 2019-11-25: qty 250

## 2019-11-25 MED ORDER — SODIUM CHLORIDE 0.9 % IV SOLN
250.0000 mg | Freq: Once | INTRAVENOUS | Status: AC
  Administered 2019-11-25: 250 mg via INTRAVENOUS
  Filled 2019-11-25: qty 25

## 2019-11-25 MED ORDER — DIPHENHYDRAMINE HCL 50 MG/ML IJ SOLN
INTRAMUSCULAR | Status: AC
  Filled 2019-11-25: qty 1

## 2019-11-25 MED ORDER — FAMOTIDINE IN NACL 20-0.9 MG/50ML-% IV SOLN
INTRAVENOUS | Status: AC
  Filled 2019-11-25: qty 50

## 2019-11-25 MED ORDER — DIPHENHYDRAMINE HCL 50 MG/ML IJ SOLN
50.0000 mg | Freq: Once | INTRAMUSCULAR | Status: AC
  Administered 2019-11-25: 50 mg via INTRAVENOUS

## 2019-11-25 MED ORDER — FAMOTIDINE IN NACL 20-0.9 MG/50ML-% IV SOLN
20.0000 mg | Freq: Once | INTRAVENOUS | Status: AC
  Administered 2019-11-25: 20 mg via INTRAVENOUS

## 2019-11-25 NOTE — Patient Instructions (Addendum)
Carboplatin injection What is this medicine? CARBOPLATIN (KAR boe pla tin) is a chemotherapy drug. It targets fast dividing cells, like cancer cells, and causes these cells to die. This medicine is used to treat ovarian cancer and many other cancers. This medicine may be used for other purposes; ask your health care provider or pharmacist if you have questions. COMMON BRAND NAME(S): Paraplatin What should I tell my health care provider before I take this medicine? They need to know if you have any of these conditions:  blood disorders  hearing problems  kidney disease  recent or ongoing radiation therapy  an unusual or allergic reaction to carboplatin, cisplatin, other chemotherapy, other medicines, foods, dyes, or preservatives  pregnant or trying to get pregnant  breast-feeding How should I use this medicine? This drug is usually given as an infusion into a vein. It is administered in a hospital or clinic by a specially trained health care professional. Talk to your pediatrician regarding the use of this medicine in children. Special care may be needed. Overdosage: If you think you have taken too much of this medicine contact a poison control center or emergency room at once. NOTE: This medicine is only for you. Do not share this medicine with others. What if I miss a dose? It is important not to miss a dose. Call your doctor or health care professional if you are unable to keep an appointment. What may interact with this medicine?  medicines for seizures  medicines to increase blood counts like filgrastim, pegfilgrastim, sargramostim  some antibiotics like amikacin, gentamicin, neomycin, streptomycin, tobramycin  vaccines Talk to your doctor or health care professional before taking any of these medicines:  acetaminophen  aspirin  ibuprofen  ketoprofen  naproxen This list may not describe all possible interactions. Give your health care provider a list of all the  medicines, herbs, non-prescription drugs, or dietary supplements you use. Also tell them if you smoke, drink alcohol, or use illegal drugs. Some items may interact with your medicine. What should I watch for while using this medicine? Your condition will be monitored carefully while you are receiving this medicine. You will need important blood work done while you are taking this medicine. This drug may make you feel generally unwell. This is not uncommon, as chemotherapy can affect healthy cells as well as cancer cells. Report any side effects. Continue your course of treatment even though you feel ill unless your doctor tells you to stop. In some cases, you may be given additional medicines to help with side effects. Follow all directions for their use. Call your doctor or health care professional for advice if you get a fever, chills or sore throat, or other symptoms of a cold or flu. Do not treat yourself. This drug decreases your body's ability to fight infections. Try to avoid being around people who are sick. This medicine may increase your risk to bruise or bleed. Call your doctor or health care professional if you notice any unusual bleeding. Be careful brushing and flossing your teeth or using a toothpick because you may get an infection or bleed more easily. If you have any dental work done, tell your dentist you are receiving this medicine. Avoid taking products that contain aspirin, acetaminophen, ibuprofen, naproxen, or ketoprofen unless instructed by your doctor. These medicines may hide a fever. Do not become pregnant while taking this medicine. Women should inform their doctor if they wish to become pregnant or think they might be pregnant. There is a  potential for serious side effects to an unborn child. Talk to your health care professional or pharmacist for more information. Do not breast-feed an infant while taking this medicine. What side effects may I notice from receiving this  medicine? Side effects that you should report to your doctor or health care professional as soon as possible:  allergic reactions like skin rash, itching or hives, swelling of the face, lips, or tongue  signs of infection - fever or chills, cough, sore throat, pain or difficulty passing urine  signs of decreased platelets or bleeding - bruising, pinpoint red spots on the skin, black, tarry stools, nosebleeds  signs of decreased red blood cells - unusually weak or tired, fainting spells, lightheadedness  breathing problems  changes in hearing  changes in vision  chest pain  high blood pressure  low blood counts - This drug may decrease the number of white blood cells, red blood cells and platelets. You may be at increased risk for infections and bleeding.  nausea and vomiting  pain, swelling, redness or irritation at the injection site  pain, tingling, numbness in the hands or feet  problems with balance, talking, walking  trouble passing urine or change in the amount of urine Side effects that usually do not require medical attention (report to your doctor or health care professional if they continue or are bothersome):  hair loss  loss of appetite  metallic taste in the mouth or changes in taste This list may not describe all possible side effects. Call your doctor for medical advice about side effects. You may report side effects to FDA at 1-800-FDA-1088. Where should I keep my medicine? This drug is given in a hospital or clinic and will not be stored at home. NOTE: This sheet is a summary. It may not cover all possible information. If you have questions about this medicine, talk to your doctor, pharmacist, or health care provider.  2020 Elsevier/Gold Standard (2007-07-17 14:38:05) Paclitaxel injection What is this medicine? PACLITAXEL (PAK li TAX el) is a chemotherapy drug. It targets fast dividing cells, like cancer cells, and causes these cells to die. This  medicine is used to treat ovarian cancer, breast cancer, lung cancer, Kaposi's sarcoma, and other cancers. This medicine may be used for other purposes; ask your health care provider or pharmacist if you have questions. COMMON BRAND NAME(S): Onxol, Taxol What should I tell my health care provider before I take this medicine? They need to know if you have any of these conditions:  history of irregular heartbeat  liver disease  low blood counts, like low white cell, platelet, or red cell counts  lung or breathing disease, like asthma  tingling of the fingers or toes, or other nerve disorder  an unusual or allergic reaction to paclitaxel, alcohol, polyoxyethylated castor oil, other chemotherapy, other medicines, foods, dyes, or preservatives  pregnant or trying to get pregnant  breast-feeding How should I use this medicine? This drug is given as an infusion into a vein. It is administered in a hospital or clinic by a specially trained health care professional. Talk to your pediatrician regarding the use of this medicine in children. Special care may be needed. Overdosage: If you think you have taken too much of this medicine contact a poison control center or emergency room at once. NOTE: This medicine is only for you. Do not share this medicine with others. What if I miss a dose? It is important not to miss your dose. Call your doctor or  health care professional if you are unable to keep an appointment. What may interact with this medicine? Do not take this medicine with any of the following medications:  disulfiram  metronidazole This medicine may also interact with the following medications:  antiviral medicines for hepatitis, HIV or AIDS  certain antibiotics like erythromycin and clarithromycin  certain medicines for fungal infections like ketoconazole and itraconazole  certain medicines for seizures like carbamazepine, phenobarbital,  phenytoin  gemfibrozil  nefazodone  rifampin  St. John's wort This list may not describe all possible interactions. Give your health care provider a list of all the medicines, herbs, non-prescription drugs, or dietary supplements you use. Also tell them if you smoke, drink alcohol, or use illegal drugs. Some items may interact with your medicine. What should I watch for while using this medicine? Your condition will be monitored carefully while you are receiving this medicine. You will need important blood work done while you are taking this medicine. This medicine can cause serious allergic reactions. To reduce your risk you will need to take other medicine(s) before treatment with this medicine. If you experience allergic reactions like skin rash, itching or hives, swelling of the face, lips, or tongue, tell your doctor or health care professional right away. In some cases, you may be given additional medicines to help with side effects. Follow all directions for their use. This drug may make you feel generally unwell. This is not uncommon, as chemotherapy can affect healthy cells as well as cancer cells. Report any side effects. Continue your course of treatment even though you feel ill unless your doctor tells you to stop. Call your doctor or health care professional for advice if you get a fever, chills or sore throat, or other symptoms of a cold or flu. Do not treat yourself. This drug decreases your body's ability to fight infections. Try to avoid being around people who are sick. This medicine may increase your risk to bruise or bleed. Call your doctor or health care professional if you notice any unusual bleeding. Be careful brushing and flossing your teeth or using a toothpick because you may get an infection or bleed more easily. If you have any dental work done, tell your dentist you are receiving this medicine. Avoid taking products that contain aspirin, acetaminophen, ibuprofen,  naproxen, or ketoprofen unless instructed by your doctor. These medicines may hide a fever. Do not become pregnant while taking this medicine. Women should inform their doctor if they wish to become pregnant or think they might be pregnant. There is a potential for serious side effects to an unborn child. Talk to your health care professional or pharmacist for more information. Do not breast-feed an infant while taking this medicine. Men are advised not to father a child while receiving this medicine. This product may contain alcohol. Ask your pharmacist or healthcare provider if this medicine contains alcohol. Be sure to tell all healthcare providers you are taking this medicine. Certain medicines, like metronidazole and disulfiram, can cause an unpleasant reaction when taken with alcohol. The reaction includes flushing, headache, nausea, vomiting, sweating, and increased thirst. The reaction can last from 30 minutes to several hours. What side effects may I notice from receiving this medicine? Side effects that you should report to your doctor or health care professional as soon as possible:  allergic reactions like skin rash, itching or hives, swelling of the face, lips, or tongue  breathing problems  changes in vision  fast, irregular heartbeat  high or  low blood pressure  mouth sores  pain, tingling, numbness in the hands or feet  signs of decreased platelets or bleeding - bruising, pinpoint red spots on the skin, black, tarry stools, blood in the urine  signs of decreased red blood cells - unusually weak or tired, feeling faint or lightheaded, falls  signs of infection - fever or chills, cough, sore throat, pain or difficulty passing urine  signs and symptoms of liver injury like dark yellow or brown urine; general ill feeling or flu-like symptoms; light-colored stools; loss of appetite; nausea; right upper belly pain; unusually weak or tired; yellowing of the eyes or  skin  swelling of the ankles, feet, hands  unusually slow heartbeat Side effects that usually do not require medical attention (report to your doctor or health care professional if they continue or are bothersome):  diarrhea  hair loss  loss of appetite  muscle or joint pain  nausea, vomiting  pain, redness, or irritation at site where injected  tiredness This list may not describe all possible side effects. Call your doctor for medical advice about side effects. You may report side effects to FDA at 1-800-FDA-1088. Where should I keep my medicine? This drug is given in a hospital or clinic and will not be stored at home. NOTE: This sheet is a summary. It may not cover all possible information. If you have questions about this medicine, talk to your doctor, pharmacist, or health care provider.  2020 Elsevier/Gold Standard (2016-12-13 13:14:55) [facility] Discharge Instructions for Patients Receiving Chemotherapy  Today you received the following chemotherapy agents Taxol, Carbo  To help prevent nausea and vomiting after your treatment, we encourage you to take your nausea medication DO NOT TAKE ZOFRAN FOR THREE DAYS.   If you develop nausea and vomiting that is not controlled by your nausea medication, call the clinic.   BELOW ARE SYMPTOMS THAT SHOULD BE REPORTED IMMEDIATELY:  *FEVER GREATER THAN 100.5 F  *CHILLS WITH OR WITHOUT FEVER  NAUSEA AND VOMITING THAT IS NOT CONTROLLED WITH YOUR NAUSEA MEDICATION  *UNUSUAL SHORTNESS OF BREATH  *UNUSUAL BRUISING OR BLEEDING  TENDERNESS IN MOUTH AND THROAT WITH OR WITHOUT PRESENCE OF ULCERS  *URINARY PROBLEMS  *BOWEL PROBLEMS  UNUSUAL RASH Items with * indicate a potential emergency and should be followed up as soon as possible.  Feel free to call the clinic should you have any questions or concerns. The clinic phone number is [phone].  Please show the [place] at check-in to the Emergency  Department and triage nurse.

## 2019-11-25 NOTE — Progress Notes (Signed)
First time TAxol , Carbo. Pt completed without complaint or issue.

## 2019-11-25 NOTE — Progress Notes (Signed)
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  Unfortunately there aren't any foundations offering copay assistance for his dx.  I informed his of the [employer], went over what it covers and gave him an expense sheet.  Pt declined the grant at this time.  I gave him my card in case he changes his mind and for any questions or concerns he may have in the future.

## 2019-11-26 ENCOUNTER — Ambulatory Visit
Admission: RE | Admit: 2019-11-26 | Discharge: 2019-11-26 | Disposition: A | Discharge status: Home / Self Care | Payer: Managed Care, Other (non HMO) | Source: Ambulatory Visit | Attending: Radiation Oncology | Admitting: Radiation Oncology

## 2019-11-26 ENCOUNTER — Other Ambulatory Visit: Payer: Self-pay

## 2019-11-26 DIAGNOSIS — Z5111 Encounter for antineoplastic chemotherapy: Secondary | ICD-10-CM | POA: Diagnosis not present

## 2019-11-27 ENCOUNTER — Ambulatory Visit
Admission: RE | Admit: 2019-11-27 | Discharge: 2019-11-27 | Disposition: A | Discharge status: Home / Self Care | Payer: Managed Care, Other (non HMO) | Source: Ambulatory Visit | Attending: Radiation Oncology | Admitting: Radiation Oncology

## 2019-11-27 ENCOUNTER — Other Ambulatory Visit: Payer: Self-pay

## 2019-11-27 DIAGNOSIS — Z5111 Encounter for antineoplastic chemotherapy: Secondary | ICD-10-CM | POA: Diagnosis not present

## 2019-11-28 ENCOUNTER — Ambulatory Visit
Admission: RE | Admit: 2019-11-28 | Discharge: 2019-11-28 | Disposition: A | Discharge status: Home / Self Care | Payer: Managed Care, Other (non HMO) | Source: Ambulatory Visit | Attending: Radiation Oncology | Admitting: Radiation Oncology

## 2019-11-28 ENCOUNTER — Other Ambulatory Visit: Payer: Self-pay

## 2019-11-28 DIAGNOSIS — Z5111 Encounter for antineoplastic chemotherapy: Secondary | ICD-10-CM | POA: Diagnosis not present

## 2019-11-29 ENCOUNTER — Ambulatory Visit
Admission: RE | Admit: 2019-11-29 | Discharge: 2019-11-29 | Disposition: A | Discharge status: Home / Self Care | Payer: Managed Care, Other (non HMO) | Source: Ambulatory Visit | Attending: Radiation Oncology | Admitting: Radiation Oncology

## 2019-11-29 ENCOUNTER — Encounter (HOSPITAL_COMMUNITY): Payer: Self-pay

## 2019-11-29 ENCOUNTER — Other Ambulatory Visit: Payer: Self-pay

## 2019-11-29 DIAGNOSIS — C3431 Malignant neoplasm of lower lobe, right bronchus or lung: Secondary | ICD-10-CM

## 2019-11-29 DIAGNOSIS — Z5111 Encounter for antineoplastic chemotherapy: Secondary | ICD-10-CM | POA: Diagnosis not present

## 2019-11-29 MED ORDER — SONAFINE EX EMUL
1.0000 "application " | Freq: Once | CUTANEOUS | Status: AC
  Administered 2019-11-29: 1 via TOPICAL

## 2019-11-29 NOTE — Progress Notes (Signed)

## 2019-11-30 NOTE — Progress Notes (Signed)
[facility] OFFICE PROGRESS NOTE  [name], NP [address] [name] [address] [place] [address]  DIAGNOSIS: Stage IV carcinoma, non-small cell lung cancer, adenocarcinoma. 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 He was diagnosed in July 2021.  Molecular Biomarkers:  MSI-High DETECTED Pembrolizumab Atezolizumab, Avelumab, Cemiplimab, Dostarlimab, Durvalumab, Ipilimumab, Nivolumab  STK11Splice Site SNV 1.9% Everolimus, Temsirolimus Yes  [id number] 1.7% Binimetinib Yes  [id number] 0.4%  Niraparib, Olaparib, Rucaparib, Talazoparib, Tazemetostat Yes  PRIOR THERAPY:  1) SRS to the solitary brain metastasis under the care of Dr. [name]. Last treatment 11/14/19  CURRENT THERAPY: Weekly concurrent chemoradiation with carboplatin for an AUC of 2, paclitaxel 45 mg per metered squared.  First dose expected on 11/25/2019. Status post 1 cycle.   INTERVAL HISTORY: Joseph Atkins 59 y.o. male returns to the clinic for a follow up visit accompanied by his wife. The patient was recently diagnosed with stage IV lung cancer. He completed SRS treatment for the metastatic brain lesion under the care of Dr. [name] on 11/14/19. He is currently taking 4 mg BID of decadron. He is wondering when he can start tapering this medication.  Otherwise, he denies anynausea, diarrhea, or vomiting. He is reporting constipation for which he has tried using Colace, prune juice, Ex-Lax, apple juice, and to drink plenty of fluids.  His last bowel movement was this morning.  He stopped taking his oxycodone for pain due to constipation.  He also reports insomnia for which he took Benadryl last night.  He tried melatonin in the past without significant relief.  He denies any blood in the stool.  He also reports cramping in his hands and feet following radiation.  He denies any fever, chills, night sweats, or  weight loss. He denies any chest pain or hemoptysis. He reports very mild shortness of breath on exertion which is "not that bad" but reports a cough which he affiliates with his tobacco use. He recently had a port-a-cath placed and tolerated it well. 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 He tolerated his first treatment well last week. He is here for evlauation before starting cycle #2.   MEDICAL HISTORY: Past Medical History:  Diagnosis Date  . Cancer (HCC)    lung cancer  . Diverticulosis   . GERD (gastroesophageal reflux disease)   . Hx of small bowel obstruction   . Hyperlipidemia   . Hypothyroidism   . IBS (irritable bowel syndrome)   . Substance abuse (HCC)    Alcoholic, Drug addition  . Thyroid disease     ALLERGIES:  is allergic to penicillins.  MEDICATIONS:  Current Outpatient Medications  Medication Sig Dispense Refill  . albuterol (PROVENTIL HFA;VENTOLIN HFA) 108 (90 Base) MCG/ACT inhaler Inhale 2 puffs into the lungs every 6 (six) hours as needed for wheezing or shortness of breath. 1 Inhaler 0  . cyclobenzaprine (FLEXERIL) 10 MG tablet Take 10 mg by mouth 2 (two) times daily as needed for muscle spasms.    Marland Kitchen dexamethasone (DECADRON) 4 MG tablet Take 1 tablet (4 mg total) by mouth 2 (two) times daily with a meal. 40 tablet 0  . docusate sodium (COLACE) 100 MG capsule Take 100 mg by mouth daily.    . fluticasone furoate-vilanterol (BREO ELLIPTA) 100-25 MCG/INH AEPB Inhale 1 puff into the lungs daily. 30 each 3  . levothyroxine (SYNTHROID) 75 MCG tablet Take 75 mcg by  mouth daily.    Marland Kitchen lidocaine-prilocaine (EMLA) cream Apply 1 application topically as needed. 30 g 0  . omeprazole (PRILOSEC) 40 MG capsule Take 1 capsule (40 mg total) by mouth daily. 90 capsule 4  . ondansetron (ZOFRAN) 4 MG tablet Take 4 mg by mouth every 8 (eight) hours as needed.    Marland Kitchen oxyCODONE-acetaminophen (PERCOCET/ROXICET) 5-325 MG tablet Take 1 tablet by mouth every 4  (four) hours as needed for severe pain. 30 tablet 0  . polyethylene glycol powder (MIRALAX) powder Take 17 g by mouth daily. 255 g 11  . prochlorperazine (COMPAZINE) 10 MG tablet Take 1 tablet (10 mg total) by mouth every 6 (six) hours as needed. 30 tablet 2  . TRULANCE 3 MG TABS Take 1 tablet by mouth daily.     No current facility-administered medications for this visit.    SURGICAL HISTORY:  Past Surgical History:  Procedure Laterality Date  . arm surgery Right   . BRONCHIAL BRUSHINGS  10/24/2019   Procedure: BRONCHIAL BRUSHINGS;  Surgeon: Collene Gobble, MD;  Location: [facility] ENDOSCOPY;  Service: Cardiopulmonary;;  right lower lobe   . BRONCHIAL BRUSHINGS  11/05/2019   Procedure: BRONCHIAL BRUSHINGS;  Surgeon: Collene Gobble, MD;  Location: [facility] ENDOSCOPY;  Service: Pulmonary;;  . BRONCHIAL NEEDLE ASPIRATION BIOPSY  10/24/2019   Procedure: BRONCHIAL NEEDLE ASPIRATION BIOPSIES;  Surgeon: Collene Gobble, MD;  Location: [place];  Service: Cardiopulmonary;;  . BRONCHIAL NEEDLE ASPIRATION BIOPSY  11/05/2019   Procedure: BRONCHIAL NEEDLE ASPIRATION BIOPSIES;  Surgeon: Collene Gobble, MD;  Location: [facility] ENDOSCOPY;  Service: Pulmonary;;  . ENDOBRONCHIAL ULTRASOUND N/A 10/24/2019   Procedure: ENDOBRONCHIAL ULTRASOUND;  Surgeon: Collene Gobble, MD;  Location: [place];  Service: Cardiopulmonary;  Laterality: N/A;  . FINGER SURGERY Right    Middle  . IR IMAGING GUIDED PORT INSERTION  11/19/2019  . VIDEO BRONCHOSCOPY N/A 10/24/2019   Procedure: VIDEO BRONCHOSCOPY WITHOUT FLUORO;  Surgeon: Collene Gobble, MD;  Location: [facility] ENDOSCOPY;  Service: Cardiopulmonary;  Laterality: N/A;  . VIDEO BRONCHOSCOPY WITH ENDOBRONCHIAL NAVIGATION N/A 11/05/2019   Procedure: VIDEO BRONCHOSCOPY WITH ENDOBRONCHIAL NAVIGATION;  Surgeon: Collene Gobble, MD;  Location: [place] ENDOSCOPY;  Service: Pulmonary;  Laterality: N/A;    REVIEW OF SYSTEMS:   Constitutional: Negative for appetite change, chills, fatigue, fever and  unexpected weight change.  HENT: Negative for mouth sores, nosebleeds, sore throat and trouble swallowing.   Eyes: Negative for eye problems and icterus.  Respiratory: Positive for mild shortness of breath and cough.  Negative for hemoptysis, and wheezing.   Cardiovascular: Negative for chest pain and leg swelling.  Gastrointestinal: Positive for frequent constipation.  Negative for abdominal pain,  diarrhea, nausea and vomiting.  Genitourinary: Negative for bladder incontinence, difficulty urinating, dysuria, frequency and hematuria.   Musculoskeletal: Negative for back pain, gait problem, neck pain and neck stiffness.  Skin: Negative for itching and rash.  Neurological: Negative for extremity weakness, gait problem, headaches, light-headedness and seizures.  Hematological: Negative for adenopathy. Does not bruise/bleed easily.  Psychiatric/Behavioral: Positive for insomnia. negative for confusion and depression. The patient is not nervous/anxious.      PHYSICAL EXAMINATION:  Blood pressure (!) 145/73, pulse 94, temperature 98 F (36.7 C), temperature source Oral, resp. rate 18, weight 176 lb 8 oz (80.1 kg), SpO2 99 %.  ECOG PERFORMANCE STATUS: 1 - Symptomatic but completely ambulatory  Physical Exam  Constitutional: Oriented to person, place, and time and well-developed, well-nourished, and in no distress.  HENT:  Head:  Normocephalic and atraumatic.  Mouth/Throat: Oropharynx is clear and moist. No oropharyngeal exudate.  Eyes: Conjunctivae are normal. Right eye exhibits no discharge. Left eye exhibits no discharge. No scleral icterus.  Neck: Normal range of motion. Neck supple.  Cardiovascular: Normal rate, regular rhythm, normal heart sounds and intact distal pulses.   Pulmonary/Chest: Effort normal and breath sounds normal. No respiratory distress. No wheezes. No rales.  Abdominal: Soft. Bowel sounds are normal. Exhibits no distension and no mass. There is no tenderness.   Musculoskeletal: Normal range of motion. Exhibits no edema.  Lymphadenopathy:    No cervical adenopathy.  Neurological: Alert and oriented to person, place, and time. Exhibits normal muscle tone. Gait normal. Coordination normal.  Skin: Skin is warm and dry. No rash noted. Not diaphoretic. No erythema. No pallor.  Psychiatric: Mood, memory and judgment normal.  Vitals reviewed.  LABORATORY DATA: Lab Results  Component Value Date   WBC 13.5 (H) 12/02/2019   HGB 14.0 12/02/2019   HCT 41.8 12/02/2019   MCV 89.5 12/02/2019   PLT 252 12/02/2019      Chemistry      Component Value Date/Time   NA 135 12/02/2019 0926   NA 141 03/08/2017 1707   K 4.3 12/02/2019 0926   CL 101 12/02/2019 0926   CO2 24 12/02/2019 0926   BUN 14 12/02/2019 0926   BUN 12 03/08/2017 1707   CREATININE 0.84 12/02/2019 0926      Component Value Date/Time   CALCIUM 9.1 12/02/2019 0926   ALKPHOS 64 12/02/2019 0926   AST 13 (L) 12/02/2019 0926   ALT 25 12/02/2019 0926   BILITOT 0.5 12/02/2019 0926       RADIOGRAPHIC STUDIES:  MR Brain W Wo Contrast  Result Date: 11/07/2019 CLINICAL DATA:  Brain/CNS neoplasm, staging. EXAM: MRI HEAD WITHOUT AND WITH CONTRAST TECHNIQUE: Multiplanar, multiecho pulse sequences of the brain and surrounding structures were obtained without and with intravenous contrast. CONTRAST:  3m MULTIHANCE GADOBENATE DIMEGLUMINE 529 MG/ML IV SOLN COMPARISON:  10/18/2019 MRI head. FINDINGS: Brain: Increased size of peripherally enhancing, centrally necrotic 2.4 x 2.3 cm left cerebellar mass (11:36) with associated peripheral restricted diffusion. Perilesional edema involving the left greater than right cerebellum with partial effacement of the fourth ventricle is grossly unchanged. New punctate high left frontal enhancement involving the precentral gyrus (11:136). Scattered T2/FLAIR supratentorial white matter foci are nonspecific however commonly associated with chronic microvascular  ischemic changes. No acute infarct or intracranial hemorrhage. No midline shift, ventriculomegaly or extra-axial fluid collection. Vascular: Normal flow voids. Right frontal developmental venous anomaly. Skull and upper cervical spine: Normal marrow signal. Sinuses/Orbits: Normal orbits. Right maxillary sinus mucous retention cyst. Trace bilateral mastoid effusions. Other: None. IMPRESSION: Increased size of 2.4 cm left cerebellar metastasis. New punctate left precentral gyrus enhancement may be vascular in origin. Continued attention on follow-up. Mild chronic microvascular ischemic changes. Electronically Signed   By: CPrimitivo GauzeM.D.   On: 11/07/2019 17:01   NM PET Image Initial (PI) Skull Base To Thigh  Result Date: 11/14/2019 CLINICAL DATA:  Subsequent treatment strategy for non-small-cell lung cancer. EXAM: NUCLEAR MEDICINE PET SKULL BASE TO THIGH TECHNIQUE: 8.7 mCi F-18 FDG was injected intravenously. Full-ring PET imaging was performed from the skull base to thigh after the radiotracer. CT data was obtained and used for attenuation correction and anatomic localization. Fasting blood glucose: 102 mg/dl COMPARISON:  Chest abdomen pelvis CT 10/18/2019. FINDINGS: Mediastinal blood pool activity: SUV max 1.6 Liver activity: SUV max NA NECK: No hypermetabolic  lymph nodes in the neck. Incidental CT findings: none CHEST: Diffuse thyroid uptake is symmetric. The large right infrahilar mass lesion is markedly hypermetabolic with SUV max = 62.9. The hypermetabolic mass extends up into the inferior right hilum but no definite separate or discrete hypermetabolic right lymphadenopathy. 9 mm short axis subcarinal node identified on previous CT scan shows no hypermetabolism but may be below size threshold for resolution on PET imaging. No other sites of unexpected hypermetabolic disease in the chest. Incidental CT findings: Coronary artery calcification is evident. Centrilobular emphsyema noted. Mild bilateral  gynecomastia. ABDOMEN/PELVIS: No abnormal hypermetabolic activity within the liver, pancreas, adrenal glands, or spleen. No hypermetabolic lymph nodes in the abdomen or pelvis. Incidental CT findings: There is abdominal aortic atherosclerosis without aneurysm. SKELETON: No focal hypermetabolic activity to suggest skeletal metastasis. Incidental CT findings: none IMPRESSION: 1. Markedly hypermetabolic right infrahilar mass lesion, consistent with the patient's known primary neoplasm. No evidence for hypermetabolic metastatic disease in the neck, chest, abdomen, or pelvis. 2.  Emphysema. (ICD10-J43.9) 3.  Aortic Atherosclerois (ICD10-170.0) Electronically Signed   By: Misty Stanley M.D.   On: 11/14/2019 10:43   DG Chest Port 1 View  Result Date: 11/05/2019 CLINICAL DATA:  Post bronchoscopy EXAM: PORTABLE CHEST 1 VIEW COMPARISON:  10/24/2019 FINDINGS: The heart size and mediastinal contours are within normal limits. Both lungs are clear. The visualized skeletal structures are unremarkable. No pneumothorax. IMPRESSION: No active disease. Electronically Signed   By: Rolm Baptise M.D.   On: 11/05/2019 08:59   IR IMAGING GUIDED PORT INSERTION  Result Date: 11/19/2019 INDICATION: 59 year old male with metastatic right lower lobe non-small cell bronchogenic carcinoma. He presents for port catheter placement. EXAM: IMPLANTED PORT A CATH PLACEMENT WITH ULTRASOUND AND FLUOROSCOPIC GUIDANCE MEDICATIONS: 900 mL clindamycin; The antibiotic was administered within an appropriate time interval prior to skin puncture. ANESTHESIA/SEDATION: Versed 3 mg IV; Fentanyl 100 mcg IV; Moderate Sedation Time:  21 minutes The patient was continuously monitored during the procedure by the interventional radiology nurse under my direct supervision. FLUOROSCOPY TIME:  0 minutes, 12 seconds (1 mGy) COMPLICATIONS: None immediate. PROCEDURE: The right neck and chest was prepped with chlorhexidine, and draped in the usual sterile fashion using  maximum barrier technique (cap and mask, sterile gown, sterile gloves, large sterile sheet, hand hygiene and cutaneous antiseptic). Local anesthesia was attained by infiltration with 1% lidocaine with epinephrine. Ultrasound demonstrated patency of the right internal jugular vein, and this was documented with an image. Under real-time ultrasound guidance, this vein was accessed with a 21 gauge micropuncture needle and image documentation was performed. A small dermatotomy was made at the access site with an 11 scalpel. A 0.018" wire was advanced into the SVC and the access needle exchanged for a 98F micropuncture vascular sheath. The 0.018" wire was then removed and a 0.035" wire advanced into the IVC. An appropriate location for the subcutaneous reservoir was selected below the clavicle and an incision was made through the skin and underlying soft tissues. The subcutaneous tissues were then dissected using a combination of blunt and sharp surgical technique and a pocket was formed. A single lumen power injectable portacatheter was then tunneled through the subcutaneous tissues from the pocket to the dermatotomy and the port reservoir placed within the subcutaneous pocket. The venous access site was then serially dilated and a peel away vascular sheath placed over the wire. The wire was removed and the port catheter advanced into position under fluoroscopic guidance. The catheter tip is positioned in the  superior cavoatrial junction. This was documented with a spot image. The portacatheter was then tested and found to flush and aspirate well. The port was flushed with saline followed by 100 units/mL heparinized saline. The pocket was then closed in two layers using first subdermal inverted interrupted absorbable sutures followed by a running subcuticular suture. The epidermis was then sealed with Dermabond. The dermatotomy at the venous access site was also closed with Dermabond. IMPRESSION: Successful placement of a  right IJ approach Power Port with ultrasound and fluoroscopic guidance. The catheter is ready for use. Electronically Signed   By: Jacqulynn Cadet M.D.   On: 11/19/2019 17:26   DG C-ARM BRONCHOSCOPY  Result Date: 11/05/2019 C-ARM BRONCHOSCOPY: Fluoroscopy was utilized by the requesting physician.  No radiographic interpretation.     ASSESSMENT/PLAN:  This is a very pleasant 59 year old Caucasian male diagnosed with stage IV non-small cell lung cancer, adenocarcinoma. 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 He was diagnosed in July 2021.His molecular studies by [employer] show he has MSI high which is a good marker for response to immunotherapy which will be important for future treatment.   The patient will complete SRS to the solidary brain metastasis under the care of Dr. Lisbeth Renshaw later today on 11/14/19. We will reach out to radiation oncology on behalf of the patient to see when he can begin tapering his decadron.   He is currently undergoing carboplatin for an AUC of 2 and paclitaxel 45 mg/m2 weekly with concurrent radiation. He is status post 1 cycle and tolerated it well.   The patient was seen with Dr. Julien Nordmann. Labs were reviewed. Recommend that he proceed with cycle #2 today as scheduled.   We will see him back for a follow up visit in 2 weeks for evaluation before starting cycle #4.  The patient will be given constipation education on his AVS today.  Regarding his insomnia, the patient was encouraged to use Benadryl or Tylenol PM if needed.   Patient's blood sugar was elevated compared to his baseline.  The patient was strongly encouraged to follow-up with his primary care provider for routine lab work.  The patient states that he is overdue for his routine follow-up with his PCP.   The patient was advised to call immediately if he has any concerning symptoms in the interval. The patient voices  understanding of current disease status and treatment options and is in agreement with the current care plan. All questions were answered. The patient knows to call the clinic with any problems, questions or concerns. We can certainly see the patient much sooner if necessary  No orders of the defined types were placed in this encounter.    Marguerite Jarboe L Doc [place]ala, PA-C 12/02/19  ADDENDUM: Hematology/oncology Attending: This is a very pleasant 59 years old white male recently diagnosed with a stage IV non-small cell lung cancer presented with locally advanced disease involving the right lower lobe/infrahilar mass as well as solitary brain metastasis in the left cerebellum status post SRS to the solitary brain metastasis. The patient is undergoing a course of concurrent chemoradiation with weekly carboplatin and paclitaxel to the locally advanced disease in the chest. He has molecular studies by [employer] that showed detection of MSI high which is predictive for good response with immunotherapy. I recommended for the patient to continue his current treatment with the concurrent chemoradiation as planned. We will see him back for follow-up visit  in 2 weeks before starting cycle #4 for evaluation and management of any adverse effect of his treatment. The patient was advised to call immediately if he has any concerning symptoms in the interval.  Disclaimer: This note was dictated with voice recognition software. Similar sounding words can inadvertently be transcribed and may be missed upon review. Eilleen Kempf, MD 12/02/19

## 2019-12-02 ENCOUNTER — Inpatient Hospital Stay: Payer: Managed Care, Other (non HMO)

## 2019-12-02 ENCOUNTER — Encounter: Payer: Self-pay | Admitting: Physician Assistant

## 2019-12-02 ENCOUNTER — Telehealth: Payer: Self-pay | Admitting: *Deleted

## 2019-12-02 ENCOUNTER — Ambulatory Visit
Admission: RE | Admit: 2019-12-02 | Discharge: 2019-12-02 | Disposition: A | Discharge status: Home / Self Care | Payer: Managed Care, Other (non HMO) | Source: Ambulatory Visit | Attending: Radiation Oncology | Admitting: Radiation Oncology

## 2019-12-02 ENCOUNTER — Other Ambulatory Visit: Payer: Self-pay

## 2019-12-02 ENCOUNTER — Inpatient Hospital Stay (HOSPITAL_BASED_OUTPATIENT_CLINIC_OR_DEPARTMENT_OTHER): Payer: Managed Care, Other (non HMO) | Admitting: Physician Assistant

## 2019-12-02 VITALS — BP 145/73 | HR 94 | Temp 98.0°F | Resp 18 | Wt 176.5 lb

## 2019-12-02 DIAGNOSIS — C3491 Malignant neoplasm of unspecified part of right bronchus or lung: Secondary | ICD-10-CM

## 2019-12-02 DIAGNOSIS — C7931 Secondary malignant neoplasm of brain: Secondary | ICD-10-CM | POA: Diagnosis not present

## 2019-12-02 DIAGNOSIS — Z5111 Encounter for antineoplastic chemotherapy: Secondary | ICD-10-CM

## 2019-12-02 LAB — CBC WITH DIFFERENTIAL (CANCER CENTER ONLY)
Abs Immature Granulocytes: 0.37 10*3/uL — ABNORMAL HIGH (ref 0.00–0.07)
Basophils Absolute: 0 10*3/uL (ref 0.0–0.1)
Basophils Relative: 0 %
Eosinophils Absolute: 0 10*3/uL (ref 0.0–0.5)
Eosinophils Relative: 0 %
HCT: 41.8 % (ref 39.0–52.0)
Hemoglobin: 14 g/dL (ref 13.0–17.0)
Immature Granulocytes: 3 %
Lymphocytes Relative: 9 %
Lymphs Abs: 1.2 10*3/uL (ref 0.7–4.0)
MCH: 30 pg (ref 26.0–34.0)
MCHC: 33.5 g/dL (ref 30.0–36.0)
MCV: 89.5 fL (ref 80.0–100.0)
Monocytes Absolute: 0.4 10*3/uL (ref 0.1–1.0)
Monocytes Relative: 3 %
Neutro Abs: 11.4 10*3/uL — ABNORMAL HIGH (ref 1.7–7.7)
Neutrophils Relative %: 85 %
Platelet Count: 252 10*3/uL (ref 150–400)
RBC: 4.67 MIL/uL (ref 4.22–5.81)
RDW: 17.1 % — ABNORMAL HIGH (ref 11.5–15.5)
WBC Count: 13.5 10*3/uL — ABNORMAL HIGH (ref 4.0–10.5)
nRBC: 0 % (ref 0.0–0.2)

## 2019-12-02 LAB — CMP (CANCER CENTER ONLY)
ALT: 25 U/L (ref 0–44)
AST: 13 U/L — ABNORMAL LOW (ref 15–41)
Albumin: 3.6 g/dL (ref 3.5–5.0)
Alkaline Phosphatase: 64 U/L (ref 38–126)
Anion gap: 10 (ref 5–15)
BUN: 14 mg/dL (ref 6–20)
CO2: 24 mmol/L (ref 22–32)
Calcium: 9.1 mg/dL (ref 8.9–10.3)
Chloride: 101 mmol/L (ref 98–111)
Creatinine: 0.84 mg/dL (ref 0.61–1.24)
GFR, Est AFR Am: >60 mL/min (ref >60)
GFR, Estimated: >60 mL/min (ref >60)
Glucose, Bld: 237 mg/dL — ABNORMAL HIGH (ref 70–99)
Potassium: 4.3 mmol/L (ref 3.5–5.1)
Sodium: 135 mmol/L (ref 135–145)
Total Bilirubin: 0.5 mg/dL (ref 0.3–1.2)
Total Protein: 6.7 g/dL (ref 6.5–8.1)

## 2019-12-02 MED ORDER — PALONOSETRON HCL INJECTION 0.25 MG/5ML
0.2500 mg | Freq: Once | INTRAVENOUS | Status: AC
  Administered 2019-12-02: 0.25 mg via INTRAVENOUS

## 2019-12-02 MED ORDER — SODIUM CHLORIDE 0.9 % IV SOLN
Freq: Once | INTRAVENOUS | Status: AC
  Filled 2019-12-02: qty 250

## 2019-12-02 MED ORDER — FAMOTIDINE IN NACL 20-0.9 MG/50ML-% IV SOLN
INTRAVENOUS | Status: AC
  Filled 2019-12-02: qty 50

## 2019-12-02 MED ORDER — SODIUM CHLORIDE 0.9 % IV SOLN
250.0000 mg | Freq: Once | INTRAVENOUS | Status: AC
  Administered 2019-12-02: 250 mg via INTRAVENOUS
  Filled 2019-12-02: qty 25

## 2019-12-02 MED ORDER — FAMOTIDINE IN NACL 20-0.9 MG/50ML-% IV SOLN
20.0000 mg | Freq: Once | INTRAVENOUS | Status: AC
  Administered 2019-12-02: 20 mg via INTRAVENOUS

## 2019-12-02 MED ORDER — SODIUM CHLORIDE 0.9 % IV SOLN
45.0000 mg/m2 | Freq: Once | INTRAVENOUS | Status: AC
  Administered 2019-12-02: 90 mg via INTRAVENOUS
  Filled 2019-12-02: qty 15

## 2019-12-02 MED ORDER — DIPHENHYDRAMINE HCL 50 MG/ML IJ SOLN
INTRAMUSCULAR | Status: AC
  Filled 2019-12-02: qty 1

## 2019-12-02 MED ORDER — DIPHENHYDRAMINE HCL 50 MG/ML IJ SOLN
50.0000 mg | Freq: Once | INTRAMUSCULAR | Status: AC
  Administered 2019-12-02: 50 mg via INTRAVENOUS

## 2019-12-02 MED ORDER — SODIUM CHLORIDE 0.9 % IV SOLN
20.0000 mg | Freq: Once | INTRAVENOUS | Status: AC
  Administered 2019-12-02: 20 mg via INTRAVENOUS
  Filled 2019-12-02: qty 20

## 2019-12-02 MED ORDER — HEPARIN SOD (PORK) LOCK FLUSH 100 UNIT/ML IV SOLN
500.0000 [IU] | Freq: Once | INTRAVENOUS | Status: AC | PRN
  Administered 2019-12-02: 500 [IU]
  Filled 2019-12-02: qty 5

## 2019-12-02 MED ORDER — SODIUM CHLORIDE 0.9% FLUSH
10.0000 mL | INTRAVENOUS | Status: DC | PRN
  Administered 2019-12-02: 10 mL
  Filled 2019-12-02: qty 10

## 2019-12-02 MED ORDER — PALONOSETRON HCL INJECTION 0.25 MG/5ML
INTRAVENOUS | Status: AC
  Filled 2019-12-02: qty 5

## 2019-12-02 NOTE — Patient Instructions (Signed)

## 2019-12-02 NOTE — Telephone Encounter (Signed)
Spoke with patient's wife regarding his steroid taper instructions.  Written instructions will be left for him when he comes for his radiation treatment.  Instructed to call if his prescription will run out before he finishes his taper.  12/02/2019: take 2 mg ( half tablet) twice daily.  12/09/2019: take 2 mg (half tablet) daily.  12/16/2019: take 2 mg (half tablet) every other day and stop on 12/25/2019.  Gloriajean Dell. [name], BSN

## 2019-12-02 NOTE — Patient Instructions (Signed)
[facility] Discharge Instructions for Patients Receiving Chemotherapy  Today you received the following chemotherapy agents Taxol, Carbo  To help prevent nausea and vomiting after your treatment, we encourage you to take your nausea medication    If you develop nausea and vomiting that is not controlled by your nausea medication, call the clinic.   BELOW ARE SYMPTOMS THAT SHOULD BE REPORTED IMMEDIATELY:  *FEVER GREATER THAN 100.5 F  *CHILLS WITH OR WITHOUT FEVER  NAUSEA AND VOMITING THAT IS NOT CONTROLLED WITH YOUR NAUSEA MEDICATION  *UNUSUAL SHORTNESS OF BREATH  *UNUSUAL BRUISING OR BLEEDING  TENDERNESS IN MOUTH AND THROAT WITH OR WITHOUT PRESENCE OF ULCERS  *URINARY PROBLEMS  *BOWEL PROBLEMS  UNUSUAL RASH Items with * indicate a potential emergency and should be followed up as soon as possible.  Feel free to call the clinic should you have any questions or concerns. The clinic phone number is [phone].  Please show the CHEMO ALERT CARD at check-in to the Emergency Department and triage nurse.   

## 2019-12-03 ENCOUNTER — Other Ambulatory Visit: Payer: Self-pay

## 2019-12-03 ENCOUNTER — Ambulatory Visit
Admission: RE | Admit: 2019-12-03 | Discharge: 2019-12-03 | Disposition: A | Discharge status: Home / Self Care | Payer: Managed Care, Other (non HMO) | Source: Ambulatory Visit | Attending: Radiation Oncology | Admitting: Radiation Oncology

## 2019-12-03 DIAGNOSIS — Z5111 Encounter for antineoplastic chemotherapy: Secondary | ICD-10-CM | POA: Diagnosis not present

## 2019-12-04 ENCOUNTER — Ambulatory Visit
Admission: RE | Admit: 2019-12-04 | Discharge: 2019-12-04 | Disposition: A | Discharge status: Home / Self Care | Payer: Managed Care, Other (non HMO) | Source: Ambulatory Visit | Attending: Radiation Oncology | Admitting: Radiation Oncology

## 2019-12-04 ENCOUNTER — Other Ambulatory Visit: Payer: Self-pay

## 2019-12-04 DIAGNOSIS — Z5111 Encounter for antineoplastic chemotherapy: Secondary | ICD-10-CM | POA: Diagnosis not present

## 2019-12-05 ENCOUNTER — Ambulatory Visit
Admission: RE | Admit: 2019-12-05 | Discharge: 2019-12-05 | Disposition: A | Discharge status: Home / Self Care | Payer: Managed Care, Other (non HMO) | Source: Ambulatory Visit | Attending: Radiation Oncology | Admitting: Radiation Oncology

## 2019-12-05 ENCOUNTER — Other Ambulatory Visit: Payer: Self-pay

## 2019-12-05 DIAGNOSIS — Z5111 Encounter for antineoplastic chemotherapy: Secondary | ICD-10-CM | POA: Diagnosis not present

## 2019-12-06 ENCOUNTER — Ambulatory Visit
Admission: RE | Admit: 2019-12-06 | Discharge: 2019-12-06 | Disposition: A | Discharge status: Home / Self Care | Payer: Managed Care, Other (non HMO) | Source: Ambulatory Visit | Attending: Radiation Oncology | Admitting: Radiation Oncology

## 2019-12-06 ENCOUNTER — Other Ambulatory Visit: Payer: Self-pay

## 2019-12-06 DIAGNOSIS — Z5111 Encounter for antineoplastic chemotherapy: Secondary | ICD-10-CM | POA: Diagnosis not present

## 2019-12-07 NOTE — Progress Notes (Signed)
  Radiation Oncology         [phone] ________________________________  Name: Joseph Atkins MRN: [id number]  Date: 11/14/2019  DOB: 1960/12/03  End of Treatment Note  Diagnosis:   Brain metastasis     Indication for treatment:  palliative       Radiation treatment dates:   11/14/19  Site/dose:    ExacTrac, 4 vmat beams max dose=125.6% PTV1 Lt Cerebellum 16mm  Narrative: The patient tolerated radiation treatment well.   There were no signs of acute toxicity after treatment.  Plan: The patient has completed radiation treatment. The patient will return to radiation oncology clinic for routine followup in one month. I advised the patient to call or return sooner if they have any questions or concerns related to their recovery or treatment. ________________________________  ------------------------------------------------  Jodelle Gross, MD, PhD

## 2019-12-09 ENCOUNTER — Ambulatory Visit
Admission: RE | Admit: 2019-12-09 | Discharge: 2019-12-09 | Disposition: A | Discharge status: Home / Self Care | Payer: Managed Care, Other (non HMO) | Source: Ambulatory Visit | Attending: Radiation Oncology | Admitting: Radiation Oncology

## 2019-12-09 ENCOUNTER — Inpatient Hospital Stay: Payer: Managed Care, Other (non HMO)

## 2019-12-09 ENCOUNTER — Other Ambulatory Visit: Payer: Self-pay

## 2019-12-09 VITALS — BP 130/83 | HR 91 | Temp 98.6°F | Resp 17 | Wt 178.2 lb

## 2019-12-09 DIAGNOSIS — Z95828 Presence of other vascular implants and grafts: Secondary | ICD-10-CM

## 2019-12-09 DIAGNOSIS — Z5111 Encounter for antineoplastic chemotherapy: Secondary | ICD-10-CM | POA: Diagnosis not present

## 2019-12-09 DIAGNOSIS — C3491 Malignant neoplasm of unspecified part of right bronchus or lung: Secondary | ICD-10-CM

## 2019-12-09 LAB — CMP (CANCER CENTER ONLY)
ALT: 20 U/L (ref 0–44)
AST: 11 U/L — ABNORMAL LOW (ref 15–41)
Albumin: 3.5 g/dL (ref 3.5–5.0)
Alkaline Phosphatase: 61 U/L (ref 38–126)
Anion gap: 9 (ref 5–15)
BUN: 10 mg/dL (ref 6–20)
CO2: 22 mmol/L (ref 22–32)
Calcium: 8.9 mg/dL (ref 8.9–10.3)
Chloride: 104 mmol/L (ref 98–111)
Creatinine: 0.78 mg/dL (ref 0.61–1.24)
GFR, Est AFR Am: >60 mL/min (ref >60)
GFR, Estimated: >60 mL/min (ref >60)
Glucose, Bld: 203 mg/dL — ABNORMAL HIGH (ref 70–99)
Potassium: 3.7 mmol/L (ref 3.5–5.1)
Sodium: 135 mmol/L (ref 135–145)
Total Bilirubin: 0.5 mg/dL (ref 0.3–1.2)
Total Protein: 6.4 g/dL — ABNORMAL LOW (ref 6.5–8.1)

## 2019-12-09 LAB — CBC WITH DIFFERENTIAL (CANCER CENTER ONLY)
Abs Immature Granulocytes: 0.15 10*3/uL — ABNORMAL HIGH (ref 0.00–0.07)
Basophils Absolute: 0 10*3/uL (ref 0.0–0.1)
Basophils Relative: 1 %
Eosinophils Absolute: 0 10*3/uL (ref 0.0–0.5)
Eosinophils Relative: 0 %
HCT: 41.5 % (ref 39.0–52.0)
Hemoglobin: 13.5 g/dL (ref 13.0–17.0)
Immature Granulocytes: 2 %
Lymphocytes Relative: 10 %
Lymphs Abs: 0.8 10*3/uL (ref 0.7–4.0)
MCH: 29.5 pg (ref 26.0–34.0)
MCHC: 32.5 g/dL (ref 30.0–36.0)
MCV: 90.6 fL (ref 80.0–100.0)
Monocytes Absolute: 0.5 10*3/uL (ref 0.1–1.0)
Monocytes Relative: 6 %
Neutro Abs: 6.9 10*3/uL (ref 1.7–7.7)
Neutrophils Relative %: 81 %
Platelet Count: 204 10*3/uL (ref 150–400)
RBC: 4.58 MIL/uL (ref 4.22–5.81)
RDW: 17.8 % — ABNORMAL HIGH (ref 11.5–15.5)
WBC Count: 8.4 10*3/uL (ref 4.0–10.5)
nRBC: 0 % (ref 0.0–0.2)

## 2019-12-09 MED ORDER — HEPARIN SOD (PORK) LOCK FLUSH 100 UNIT/ML IV SOLN
500.0000 [IU] | Freq: Once | INTRAVENOUS | Status: AC | PRN
  Administered 2019-12-09: 500 [IU]
  Filled 2019-12-09: qty 5

## 2019-12-09 MED ORDER — FAMOTIDINE IN NACL 20-0.9 MG/50ML-% IV SOLN
20.0000 mg | Freq: Once | INTRAVENOUS | Status: AC
  Administered 2019-12-09: 20 mg via INTRAVENOUS

## 2019-12-09 MED ORDER — SODIUM CHLORIDE 0.9% FLUSH
10.0000 mL | INTRAVENOUS | Status: DC | PRN
  Administered 2019-12-09: 10 mL via INTRAVENOUS
  Filled 2019-12-09: qty 10

## 2019-12-09 MED ORDER — SODIUM CHLORIDE 0.9% FLUSH
10.0000 mL | INTRAVENOUS | Status: DC | PRN
  Administered 2019-12-09: 10 mL
  Filled 2019-12-09: qty 10

## 2019-12-09 MED ORDER — PALONOSETRON HCL INJECTION 0.25 MG/5ML
INTRAVENOUS | Status: AC
  Filled 2019-12-09: qty 5

## 2019-12-09 MED ORDER — DIPHENHYDRAMINE HCL 50 MG/ML IJ SOLN
INTRAMUSCULAR | Status: AC
  Filled 2019-12-09: qty 1

## 2019-12-09 MED ORDER — SODIUM CHLORIDE 0.9 % IV SOLN
45.0000 mg/m2 | Freq: Once | INTRAVENOUS | Status: AC
  Administered 2019-12-09: 90 mg via INTRAVENOUS
  Filled 2019-12-09: qty 15

## 2019-12-09 MED ORDER — SODIUM CHLORIDE 0.9 % IV SOLN
270.0000 mg | Freq: Once | INTRAVENOUS | Status: AC
  Administered 2019-12-09: 270 mg via INTRAVENOUS
  Filled 2019-12-09: qty 27

## 2019-12-09 MED ORDER — FAMOTIDINE IN NACL 20-0.9 MG/50ML-% IV SOLN
INTRAVENOUS | Status: AC
  Filled 2019-12-09: qty 50

## 2019-12-09 MED ORDER — DIPHENHYDRAMINE HCL 50 MG/ML IJ SOLN
50.0000 mg | Freq: Once | INTRAMUSCULAR | Status: AC
  Administered 2019-12-09: 50 mg via INTRAVENOUS

## 2019-12-09 MED ORDER — SODIUM CHLORIDE 0.9 % IV SOLN
Freq: Once | INTRAVENOUS | Status: AC
  Filled 2019-12-09: qty 250

## 2019-12-09 MED ORDER — PALONOSETRON HCL INJECTION 0.25 MG/5ML
0.2500 mg | Freq: Once | INTRAVENOUS | Status: AC
  Administered 2019-12-09: 0.25 mg via INTRAVENOUS

## 2019-12-09 MED ORDER — SODIUM CHLORIDE 0.9 % IV SOLN
20.0000 mg | Freq: Once | INTRAVENOUS | Status: AC
  Administered 2019-12-09: 20 mg via INTRAVENOUS
  Filled 2019-12-09: qty 20

## 2019-12-09 NOTE — Patient Instructions (Signed)

## 2019-12-09 NOTE — Patient Instructions (Signed)
[place] Discharge Instructions for Patients Receiving Chemotherapy  Today you received the following chemotherapy agents: Paclitaxel, Carboplatin  To help prevent nausea and vomiting after your treatment, we encourage you to take your nausea medication as directed.   If you develop nausea and vomiting that is not controlled by your nausea medication, call the clinic.   BELOW ARE SYMPTOMS THAT SHOULD BE REPORTED IMMEDIATELY:  *FEVER GREATER THAN 100.5 F  *CHILLS WITH OR WITHOUT FEVER  NAUSEA AND VOMITING THAT IS NOT CONTROLLED WITH YOUR NAUSEA MEDICATION  *UNUSUAL SHORTNESS OF BREATH  *UNUSUAL BRUISING OR BLEEDING  TENDERNESS IN MOUTH AND THROAT WITH OR WITHOUT PRESENCE OF ULCERS  *URINARY PROBLEMS  *BOWEL PROBLEMS  UNUSUAL RASH Items with * indicate a potential emergency and should be followed up as soon as possible.  Feel free to call the clinic should you have any questions or concerns. The clinic phone number is [phone].  Please show the [place] at check-in to the Emergency Department and triage nurse.

## 2019-12-10 ENCOUNTER — Ambulatory Visit
Admission: RE | Admit: 2019-12-10 | Discharge: 2019-12-10 | Disposition: A | Discharge status: Home / Self Care | Payer: Managed Care, Other (non HMO) | Source: Ambulatory Visit | Attending: Radiation Oncology | Admitting: Radiation Oncology

## 2019-12-10 ENCOUNTER — Other Ambulatory Visit: Payer: Self-pay

## 2019-12-10 DIAGNOSIS — Z5111 Encounter for antineoplastic chemotherapy: Secondary | ICD-10-CM | POA: Diagnosis not present

## 2019-12-11 ENCOUNTER — Ambulatory Visit
Admission: RE | Admit: 2019-12-11 | Discharge: 2019-12-11 | Disposition: A | Discharge status: Home / Self Care | Payer: Managed Care, Other (non HMO) | Source: Ambulatory Visit | Attending: Radiation Oncology | Admitting: Radiation Oncology

## 2019-12-11 ENCOUNTER — Other Ambulatory Visit: Payer: Self-pay

## 2019-12-11 DIAGNOSIS — Z5111 Encounter for antineoplastic chemotherapy: Secondary | ICD-10-CM | POA: Diagnosis not present

## 2019-12-12 ENCOUNTER — Ambulatory Visit
Admission: RE | Admit: 2019-12-12 | Discharge: 2019-12-12 | Disposition: A | Discharge status: Home / Self Care | Payer: Managed Care, Other (non HMO) | Source: Ambulatory Visit | Attending: Radiation Oncology | Admitting: Radiation Oncology

## 2019-12-12 DIAGNOSIS — Z5111 Encounter for antineoplastic chemotherapy: Secondary | ICD-10-CM | POA: Diagnosis not present

## 2019-12-13 ENCOUNTER — Ambulatory Visit
Admission: RE | Admit: 2019-12-13 | Discharge: 2019-12-13 | Disposition: A | Discharge status: Home / Self Care | Payer: Managed Care, Other (non HMO) | Source: Ambulatory Visit | Attending: Radiation Oncology | Admitting: Radiation Oncology

## 2019-12-13 ENCOUNTER — Other Ambulatory Visit: Payer: Self-pay | Admitting: Radiation Oncology

## 2019-12-13 ENCOUNTER — Other Ambulatory Visit: Payer: Self-pay

## 2019-12-13 DIAGNOSIS — Z5111 Encounter for antineoplastic chemotherapy: Secondary | ICD-10-CM | POA: Diagnosis not present

## 2019-12-13 MED ORDER — SUCRALFATE 1 G PO TABS
1.0000 g | ORAL_TABLET | Freq: Four times a day (QID) | ORAL | 2 refills | Status: DC
Start: 2019-12-13 — End: 2020-09-22

## 2019-12-16 ENCOUNTER — Inpatient Hospital Stay (HOSPITAL_BASED_OUTPATIENT_CLINIC_OR_DEPARTMENT_OTHER): Payer: Managed Care, Other (non HMO) | Admitting: Internal Medicine

## 2019-12-16 ENCOUNTER — Other Ambulatory Visit: Payer: Self-pay

## 2019-12-16 ENCOUNTER — Encounter: Payer: Self-pay | Admitting: Internal Medicine

## 2019-12-16 ENCOUNTER — Inpatient Hospital Stay: Payer: Managed Care, Other (non HMO)

## 2019-12-16 ENCOUNTER — Ambulatory Visit
Admission: RE | Admit: 2019-12-16 | Discharge: 2019-12-16 | Disposition: A | Discharge status: Home / Self Care | Payer: Managed Care, Other (non HMO) | Source: Ambulatory Visit | Attending: Radiation Oncology | Admitting: Radiation Oncology

## 2019-12-16 VITALS — BP 123/82 | HR 100 | Temp 98.1°F | Resp 17 | Ht 68.0 in | Wt 175.4 lb

## 2019-12-16 DIAGNOSIS — C3491 Malignant neoplasm of unspecified part of right bronchus or lung: Secondary | ICD-10-CM

## 2019-12-16 DIAGNOSIS — C3431 Malignant neoplasm of lower lobe, right bronchus or lung: Secondary | ICD-10-CM | POA: Diagnosis not present

## 2019-12-16 DIAGNOSIS — I10 Essential (primary) hypertension: Secondary | ICD-10-CM | POA: Diagnosis not present

## 2019-12-16 DIAGNOSIS — Z5111 Encounter for antineoplastic chemotherapy: Secondary | ICD-10-CM | POA: Diagnosis not present

## 2019-12-16 DIAGNOSIS — Z95828 Presence of other vascular implants and grafts: Secondary | ICD-10-CM

## 2019-12-16 DIAGNOSIS — C7931 Secondary malignant neoplasm of brain: Secondary | ICD-10-CM

## 2019-12-16 LAB — CMP (CANCER CENTER ONLY)
ALT: 15 U/L (ref 0–44)
AST: 10 U/L — ABNORMAL LOW (ref 15–41)
Albumin: 3.5 g/dL (ref 3.5–5.0)
Alkaline Phosphatase: 61 U/L (ref 38–126)
Anion gap: 8 (ref 5–15)
BUN: 10 mg/dL (ref 6–20)
CO2: 24 mmol/L (ref 22–32)
Calcium: 9.3 mg/dL (ref 8.9–10.3)
Chloride: 103 mmol/L (ref 98–111)
Creatinine: 0.81 mg/dL (ref 0.61–1.24)
GFR, Est AFR Am: >60 mL/min (ref >60)
GFR, Estimated: >60 mL/min (ref >60)
Glucose, Bld: 132 mg/dL — ABNORMAL HIGH (ref 70–99)
Potassium: 4 mmol/L (ref 3.5–5.1)
Sodium: 135 mmol/L (ref 135–145)
Total Bilirubin: 0.4 mg/dL (ref 0.3–1.2)
Total Protein: 6.6 g/dL (ref 6.5–8.1)

## 2019-12-16 LAB — CBC WITH DIFFERENTIAL (CANCER CENTER ONLY)
Abs Immature Granulocytes: 0.19 10*3/uL — ABNORMAL HIGH (ref 0.00–0.07)
Basophils Absolute: 0.1 10*3/uL (ref 0.0–0.1)
Basophils Relative: 1 %
Eosinophils Absolute: 0 10*3/uL (ref 0.0–0.5)
Eosinophils Relative: 0 %
HCT: 39.7 % (ref 39.0–52.0)
Hemoglobin: 13.2 g/dL (ref 13.0–17.0)
Immature Granulocytes: 3 %
Lymphocytes Relative: 8 %
Lymphs Abs: 0.5 10*3/uL — ABNORMAL LOW (ref 0.7–4.0)
MCH: 29.7 pg (ref 26.0–34.0)
MCHC: 33.2 g/dL (ref 30.0–36.0)
MCV: 89.2 fL (ref 80.0–100.0)
Monocytes Absolute: 0.5 10*3/uL (ref 0.1–1.0)
Monocytes Relative: 7 %
Neutro Abs: 5.5 10*3/uL (ref 1.7–7.7)
Neutrophils Relative %: 81 %
Platelet Count: 176 10*3/uL (ref 150–400)
RBC: 4.45 MIL/uL (ref 4.22–5.81)
RDW: 17.4 % — ABNORMAL HIGH (ref 11.5–15.5)
WBC Count: 6.7 10*3/uL (ref 4.0–10.5)
nRBC: 0 % (ref 0.0–0.2)

## 2019-12-16 MED ORDER — DIPHENHYDRAMINE HCL 50 MG/ML IJ SOLN
50.0000 mg | Freq: Once | INTRAMUSCULAR | Status: AC
  Administered 2019-12-16: 50 mg via INTRAVENOUS

## 2019-12-16 MED ORDER — SODIUM CHLORIDE 0.9% FLUSH
10.0000 mL | INTRAVENOUS | Status: DC | PRN
  Administered 2019-12-16: 10 mL
  Filled 2019-12-16: qty 10

## 2019-12-16 MED ORDER — HEPARIN SOD (PORK) LOCK FLUSH 100 UNIT/ML IV SOLN
500.0000 [IU] | Freq: Once | INTRAVENOUS | Status: AC | PRN
  Administered 2019-12-16: 500 [IU]
  Filled 2019-12-16: qty 5

## 2019-12-16 MED ORDER — SODIUM CHLORIDE 0.9 % IV SOLN
250.0000 mg | Freq: Once | INTRAVENOUS | Status: AC
  Administered 2019-12-16: 250 mg via INTRAVENOUS
  Filled 2019-12-16: qty 25

## 2019-12-16 MED ORDER — PALONOSETRON HCL INJECTION 0.25 MG/5ML
INTRAVENOUS | Status: AC
  Filled 2019-12-16: qty 5

## 2019-12-16 MED ORDER — PALONOSETRON HCL INJECTION 0.25 MG/5ML
0.2500 mg | Freq: Once | INTRAVENOUS | Status: AC
  Administered 2019-12-16: 0.25 mg via INTRAVENOUS

## 2019-12-16 MED ORDER — SODIUM CHLORIDE 0.9 % IV SOLN
Freq: Once | INTRAVENOUS | Status: AC
  Filled 2019-12-16: qty 250

## 2019-12-16 MED ORDER — SODIUM CHLORIDE 0.9 % IV SOLN
45.0000 mg/m2 | Freq: Once | INTRAVENOUS | Status: AC
  Administered 2019-12-16: 90 mg via INTRAVENOUS
  Filled 2019-12-16: qty 15

## 2019-12-16 MED ORDER — FAMOTIDINE IN NACL 20-0.9 MG/50ML-% IV SOLN
20.0000 mg | Freq: Once | INTRAVENOUS | Status: AC
  Administered 2019-12-16: 20 mg via INTRAVENOUS

## 2019-12-16 MED ORDER — SODIUM CHLORIDE 0.9 % IV SOLN
20.0000 mg | Freq: Once | INTRAVENOUS | Status: AC
  Administered 2019-12-16: 20 mg via INTRAVENOUS
  Filled 2019-12-16: qty 20

## 2019-12-16 MED ORDER — SODIUM CHLORIDE 0.9% FLUSH
10.0000 mL | INTRAVENOUS | Status: DC | PRN
  Administered 2019-12-16: 10 mL via INTRAVENOUS
  Filled 2019-12-16: qty 10

## 2019-12-16 MED ORDER — FAMOTIDINE IN NACL 20-0.9 MG/50ML-% IV SOLN
INTRAVENOUS | Status: AC
  Filled 2019-12-16: qty 50

## 2019-12-16 MED ORDER — DIPHENHYDRAMINE HCL 50 MG/ML IJ SOLN
INTRAMUSCULAR | Status: AC
  Filled 2019-12-16: qty 1

## 2019-12-16 NOTE — Patient Instructions (Signed)
[place] Discharge Instructions for Patients Receiving Chemotherapy  Today you received the following chemotherapy agents: taxol, carboplatin   To help prevent nausea and vomiting after your treatment, we encourage you to take your nausea medication as directed.    If you develop nausea and vomiting that is not controlled by your nausea medication, call the clinic.   BELOW ARE SYMPTOMS THAT SHOULD BE REPORTED IMMEDIATELY:  *FEVER GREATER THAN 100.5 F  *CHILLS WITH OR WITHOUT FEVER  NAUSEA AND VOMITING THAT IS NOT CONTROLLED WITH YOUR NAUSEA MEDICATION  *UNUSUAL SHORTNESS OF BREATH  *UNUSUAL BRUISING OR BLEEDING  TENDERNESS IN MOUTH AND THROAT WITH OR WITHOUT PRESENCE OF ULCERS  *URINARY PROBLEMS  *BOWEL PROBLEMS  UNUSUAL RASH Items with * indicate a potential emergency and should be followed up as soon as possible.  Feel free to call the clinic should you have any questions or concerns. The clinic phone number is [phone].  Please show the [place] at check-in to the Emergency Department and triage nurse.

## 2019-12-16 NOTE — Patient Instructions (Signed)
Steps to Quit Smoking Smoking tobacco is the leading cause of preventable death. It can affect almost every organ in the body. Smoking puts you and people around you at risk for many serious, long-lasting (chronic) diseases. Quitting smoking can be hard, but it is one of the best things that you can do for your health. It is never too late to quit. How do I get ready to quit? When you decide to quit smoking, make a plan to help you succeed. Before you quit:  Pick a date to quit. Set a date within the next 2 weeks to give you time to prepare.  Write down the reasons why you are quitting. Keep this list in places where you will see it often.  Tell your family, friends, and co-workers that you are quitting. Their support is important.  Talk with your doctor about the choices that may help you quit.  Find out if your health insurance will pay for these treatments.  Know the people, places, things, and activities that make you want to smoke (triggers). Avoid them. What first steps can I take to quit smoking?  Throw away all cigarettes at home, at work, and in your car.  Throw away the things that you use when you smoke, such as ashtrays and lighters.  Clean your car. Make sure to empty the ashtray.  Clean your home, including curtains and carpets. What can I do to help me quit smoking? Talk with your doctor about taking medicines and seeing a counselor at the same time. You are more likely to succeed when you do both.  If you are pregnant or breastfeeding, talk with your doctor about counseling or other ways to quit smoking. Do not take medicine to help you quit smoking unless your doctor tells you to do so. To quit smoking: Quit right away  Quit smoking totally, instead of slowly cutting back on how much you smoke over a period of time.  Go to counseling. You are more likely to quit if you go to counseling sessions regularly. Take medicine You may take medicines to help you quit. Some  medicines need a prescription, and some you can buy over-the-counter. Some medicines may contain a drug called nicotine to replace the nicotine in cigarettes. Medicines may:  Help you to stop having the desire to smoke (cravings).  Help to stop the problems that come when you stop smoking (withdrawal symptoms). Your doctor may ask you to use:  Nicotine patches, gum, or lozenges.  Nicotine inhalers or sprays.  Non-nicotine medicine that is taken by mouth. Find resources Find resources and other ways to help you quit smoking and remain smoke-free after you quit. These resources are most helpful when you use them often. They include:  Online chats with a counselor.  Phone quitlines.  Printed self-help materials.  Support groups or group counseling.  Text messaging programs.  Mobile phone apps. Use apps on your mobile phone or tablet that can help you stick to your quit plan. There are many free apps for mobile phones and tablets as well as websites. Examples include Quit Guide from the CDC and smokefree.gov  What things can I do to make it easier to quit?   Talk to your family and friends. Ask them to support and encourage you.  Call a phone quitline (1-800-QUIT-NOW), reach out to support groups, or work with a counselor.  Ask people who smoke to not smoke around you.  Avoid places that make you want to smoke,   such as: ? Bars. ? Parties. ? Smoke-break areas at work.  Spend time with people who do not smoke.  Lower the stress in your life. Stress can make you want to smoke. Try these things to help your stress: ? Getting regular exercise. ? Doing deep-breathing exercises. ? Doing yoga. ? Meditating. ? Doing a body scan. To do this, close your eyes, focus on one area of your body at a time from head to toe. Notice which parts of your body are tense. Try to relax the muscles in those areas. How will I feel when I quit smoking? Day 1 to 3 weeks Within the first 24 hours,  you may start to have some problems that come from quitting tobacco. These problems are very bad 2-3 days after you quit, but they do not often last for more than 2-3 weeks. You may get these symptoms:  Mood swings.  Feeling restless, nervous, angry, or annoyed.  Trouble concentrating.  Dizziness.  Strong desire for high-sugar foods and nicotine.  Weight gain.  Trouble pooping (constipation).  Feeling like you may vomit (nausea).  Coughing or a sore throat.  Changes in how the medicines that you take for other issues work in your body.  Depression.  Trouble sleeping (insomnia). Week 3 and afterward After the first 2-3 weeks of quitting, you may start to notice more positive results, such as:  Better sense of smell and taste.  Less coughing and sore throat.  Slower heart rate.  Lower blood pressure.  Clearer skin.  Better breathing.  Fewer sick days. Quitting smoking can be hard. Do not give up if you fail the first time. Some people need to try a few times before they succeed. Do your best to stick to your quit plan, and talk with your doctor if you have any questions or concerns. Summary  Smoking tobacco is the leading cause of preventable death. Quitting smoking can be hard, but it is one of the best things that you can do for your health.  When you decide to quit smoking, make a plan to help you succeed.  Quit smoking right away, not slowly over a period of time.  When you start quitting, seek help from your doctor, family, or friends. This information is not intended to replace advice given to you by your health care provider. Make sure you discuss any questions you have with your health care provider. Document Revised: 01/04/2019 Document Reviewed: 06/30/2018 Elsevier Patient Education  2020 [employer]vier Inc.  

## 2019-12-16 NOTE — Patient Instructions (Signed)

## 2019-12-16 NOTE — Progress Notes (Signed)
[facility] [phone]   [phone]  OFFICE PROGRESS NOTE  [name], NP [address] [name] [address] [place] [address]  DIAGNOSIS: Stage IV (T3, N0, M1C) non-small cell lung cancer, adenocarcinoma.The patient presented with a right lower lobe/infrahilar mass as well as a solitary brain metastasis in the left cerebellum. He was diagnosed in July 2021.  Molecular Biomarkers:  MSI-High DETECTED Pembrolizumab Atezolizumab, Avelumab, Cemiplimab, Dostarlimab, Durvalumab, Ipilimumab, Nivolumab  STK11Splice Site SNV 6.8% Everolimus, Temsirolimus Yes  KRASG12D 1.7% Binimetinib Yes  [id number] 0.4%  Niraparib, Olaparib, Rucaparib, Talazoparib, Tazemetostat Yes  PRIOR THERAPY:  1) SRS to the solitary brain metastasis under the care of Dr. Lisbeth Renshaw. Last treatment 11/14/19  CURRENT THERAPY: Weekly concurrent chemoradiation with carboplatin for an AUC of 2, paclitaxel 45 mg/m2.First dose expected on 11/25/2019. Status post 3 cycles.   INTERVAL HISTORY: Joseph Atkins 59 y.o. male returns to the clinic today for follow-up visit accompanied by his wife.  The patient is feeling fine today with no concerning complaints except for mild odynophagia.  He denied having any significant weight loss or night sweats.  He has no chest pain, shortness of breath but continues to have mild cough with no hemoptysis.  He denied having any fever or chills.  He continues to tolerate his course of concurrent chemoradiation fairly well.  The patient is here today for evaluation before starting cycle #4.  MEDICAL HISTORY: Past Medical History:  Diagnosis Date  . Cancer ([place])    lung cancer  . Diverticulosis   . GERD (gastroesophageal reflux disease)   . Hx of small bowel obstruction   . Hyperlipidemia   . Hypothyroidism   . IBS (irritable bowel syndrome)   . Substance abuse ([facility])    Alcoholic, Drug addition  . Thyroid disease      ALLERGIES:  is allergic to penicillins.  MEDICATIONS:  Current Outpatient Medications  Medication Sig Dispense Refill  . dexamethasone (DECADRON) 4 MG tablet Take 1 tablet (4 mg total) by mouth 2 (two) times daily with a meal. 40 tablet 0  . docusate sodium (COLACE) 100 MG capsule Take 100 mg by mouth daily.    . fluticasone furoate-vilanterol (BREO ELLIPTA) 100-25 MCG/INH AEPB Inhale 1 puff into the lungs daily. 30 each 3  . levothyroxine (SYNTHROID) 75 MCG tablet Take 75 mcg by mouth daily.    Marland Kitchen lidocaine-prilocaine (EMLA) cream Apply 1 application topically as needed. 30 g 0  . omeprazole (PRILOSEC) 40 MG capsule Take 1 capsule (40 mg total) by mouth daily. 90 capsule 4  . polyethylene glycol powder (MIRALAX) powder Take 17 g by mouth daily. 255 g 11  . sucralfate (CARAFATE) 1 g tablet Take 1 tablet (1 g total) by mouth 4 (four) times daily. Dissolve each tablet in 15 cc water before use. 120 tablet 2  . TRULANCE 3 MG TABS Take 1 tablet by mouth daily.    Marland Kitchen albuterol (PROVENTIL HFA;VENTOLIN HFA) 108 (90 Base) MCG/ACT inhaler Inhale 2 puffs into the lungs every 6 (six) hours as needed for wheezing or shortness of breath. (Patient not taking: Reported on 12/16/2019) 1 Inhaler 0  . ANORO ELLIPTA 62.5-25 MCG/INH AEPB 1 puff daily.    . cyclobenzaprine (FLEXERIL) 10 MG tablet Take 10 mg by mouth 2 (two) times daily as needed for muscle spasms. (Patient not taking: Reported on 12/16/2019)    . ondansetron (ZOFRAN) 4 MG tablet Take 4 mg by mouth every 8 (eight)  hours as needed. (Patient not taking: Reported on 12/16/2019)    . oxyCODONE-acetaminophen (PERCOCET/ROXICET) 5-325 MG tablet Take 1 tablet by mouth every 4 (four) hours as needed for severe pain. (Patient not taking: Reported on 12/16/2019) 30 tablet 0  . prochlorperazine (COMPAZINE) 10 MG tablet Take 1 tablet (10 mg total) by mouth every 6 (six) hours as needed. (Patient not taking: Reported on 12/16/2019) 30 tablet 2   No current  facility-administered medications for this visit.    SURGICAL HISTORY:  Past Surgical History:  Procedure Laterality Date  . arm surgery Right   . BRONCHIAL BRUSHINGS  10/24/2019   Procedure: BRONCHIAL BRUSHINGS;  Surgeon: Collene Gobble, MD;  Location: [facility] ENDOSCOPY;  Service: Cardiopulmonary;;  right lower lobe   . BRONCHIAL BRUSHINGS  11/05/2019   Procedure: BRONCHIAL BRUSHINGS;  Surgeon: Collene Gobble, MD;  Location: [facility] ENDOSCOPY;  Service: Pulmonary;;  . BRONCHIAL NEEDLE ASPIRATION BIOPSY  10/24/2019   Procedure: BRONCHIAL NEEDLE ASPIRATION BIOPSIES;  Surgeon: Collene Gobble, MD;  Location: [facility];  Service: Cardiopulmonary;;  . BRONCHIAL NEEDLE ASPIRATION BIOPSY  11/05/2019   Procedure: BRONCHIAL NEEDLE ASPIRATION BIOPSIES;  Surgeon: Collene Gobble, MD;  Location: [facility] ENDOSCOPY;  Service: Pulmonary;;  . ENDOBRONCHIAL ULTRASOUND N/A 10/24/2019   Procedure: ENDOBRONCHIAL ULTRASOUND;  Surgeon: Collene Gobble, MD;  Location: [facility];  Service: Cardiopulmonary;  Laterality: N/A;  . FINGER SURGERY Right    Middle  . IR IMAGING GUIDED PORT INSERTION  11/19/2019  . VIDEO BRONCHOSCOPY N/A 10/24/2019   Procedure: VIDEO BRONCHOSCOPY WITHOUT FLUORO;  Surgeon: Collene Gobble, MD;  Location: [facility] ENDOSCOPY;  Service: Cardiopulmonary;  Laterality: N/A;  . VIDEO BRONCHOSCOPY WITH ENDOBRONCHIAL NAVIGATION N/A 11/05/2019   Procedure: VIDEO BRONCHOSCOPY WITH ENDOBRONCHIAL NAVIGATION;  Surgeon: Collene Gobble, MD;  Location: [facility] ENDOSCOPY;  Service: Pulmonary;  Laterality: N/A;    REVIEW OF SYSTEMS:  A comprehensive review of systems was negative except for: Respiratory: positive for cough Gastrointestinal: positive for odynophagia   PHYSICAL EXAMINATION: General appearance: alert, cooperative, fatigued and no distress Head: Normocephalic, without obvious abnormality, atraumatic Neck: no adenopathy, no JVD, supple, symmetrical, trachea midline and thyroid not enlarged, symmetric, no  tenderness/mass/nodules Lymph nodes: Cervical, supraclavicular, and axillary nodes normal. Resp: clear to auscultation bilaterally Back: symmetric, no curvature. ROM normal. No CVA tenderness. Cardio: regular rate and rhythm, S1, S2 normal, no murmur, click, rub or gallop GI: soft, non-tender; bowel sounds normal; no masses,  no organomegaly Extremities: extremities normal, atraumatic, no cyanosis or edema  ECOG PERFORMANCE STATUS: 1 - Symptomatic but completely ambulatory  Blood pressure 123/82, pulse 100, temperature 98.1 F (36.7 C), temperature source Tympanic, resp. rate 17, height $RemoveBe'5\' 8"'kCdtrnlQS$  (1.727 m), weight 175 lb 6.4 oz (79.6 kg), SpO2 100 %.  LABORATORY DATA: Lab Results  Component Value Date   WBC 6.7 12/16/2019   HGB 13.2 12/16/2019   HCT 39.7 12/16/2019   MCV 89.2 12/16/2019   PLT 176 12/16/2019      Chemistry      Component Value Date/Time   NA 135 12/09/2019 0805   NA 141 03/08/2017 1707   K 3.7 12/09/2019 0805   CL 104 12/09/2019 0805   CO2 22 12/09/2019 0805   BUN 10 12/09/2019 0805   BUN 12 03/08/2017 1707   CREATININE 0.78 12/09/2019 0805      Component Value Date/Time   CALCIUM 8.9 12/09/2019 0805   ALKPHOS 61 12/09/2019 0805   AST 11 (L) 12/09/2019 0805   ALT 20 12/09/2019  0805   BILITOT 0.5 12/09/2019 0805       RADIOGRAPHIC STUDIES: IR IMAGING GUIDED PORT INSERTION  Result Date: 11/19/2019 INDICATION: 59 year old male with metastatic right lower lobe non-small cell bronchogenic carcinoma. He presents for port catheter placement. EXAM: IMPLANTED PORT A CATH PLACEMENT WITH ULTRASOUND AND FLUOROSCOPIC GUIDANCE MEDICATIONS: 900 mL clindamycin; The antibiotic was administered within an appropriate time interval prior to skin puncture. ANESTHESIA/SEDATION: Versed 3 mg IV; Fentanyl 100 mcg IV; Moderate Sedation Time:  21 minutes The patient was continuously monitored during the procedure by the interventional radiology nurse under my direct supervision.  FLUOROSCOPY TIME:  0 minutes, 12 seconds (1 mGy) COMPLICATIONS: None immediate. PROCEDURE: The right neck and chest was prepped with chlorhexidine, and draped in the usual sterile fashion using maximum barrier technique (cap and mask, sterile gown, sterile gloves, large sterile sheet, hand hygiene and cutaneous antiseptic). Local anesthesia was attained by infiltration with 1% lidocaine with epinephrine. Ultrasound demonstrated patency of the right internal jugular vein, and this was documented with an image. Under real-time ultrasound guidance, this vein was accessed with a 21 gauge micropuncture needle and image documentation was performed. A small dermatotomy was made at the access site with an 11 scalpel. A 0.018" wire was advanced into the SVC and the access needle exchanged for a 2F micropuncture vascular sheath. The 0.018" wire was then removed and a 0.035" wire advanced into the IVC. An appropriate location for the subcutaneous reservoir was selected below the clavicle and an incision was made through the skin and underlying soft tissues. The subcutaneous tissues were then dissected using a combination of blunt and sharp surgical technique and a pocket was formed. A single lumen power injectable portacatheter was then tunneled through the subcutaneous tissues from the pocket to the dermatotomy and the port reservoir placed within the subcutaneous pocket. The venous access site was then serially dilated and a peel away vascular sheath placed over the wire. The wire was removed and the port catheter advanced into position under fluoroscopic guidance. The catheter tip is positioned in the superior cavoatrial junction. This was documented with a spot image. The portacatheter was then tested and found to flush and aspirate well. The port was flushed with saline followed by 100 units/mL heparinized saline. The pocket was then closed in two layers using first subdermal inverted interrupted absorbable sutures  followed by a running subcuticular suture. The epidermis was then sealed with Dermabond. The dermatotomy at the venous access site was also closed with Dermabond. IMPRESSION: Successful placement of a right IJ approach Power Port with ultrasound and fluoroscopic guidance. The catheter is ready for use. Electronically Signed   By: Jacqulynn Cadet M.D.   On: 11/19/2019 17:26    ASSESSMENT AND PLAN: This is a very pleasant 59 years old white male with a stage IV (t3, N0, M1c) non-small cell lung cancer, adenocarcinoma with MSI high presented with right lower lobe/infrahilar mass in addition to solitary brain metastasis in the left cerebellum diagnosed in July 2021. He is status post SRS to the solitary brain metastasis. The patient is currently undergoing a course of concurrent chemoradiation for the locally advanced disease in the lung with weekly carboplatin for AUC of 2 and paclitaxel 45 mg/M2 status post 3 cycles. He has been tolerating this treatment well except for the odynophagia and fatigue. I recommended for the patient to proceed with cycle #4 today as planned. He will come back for follow-up visit in 2 weeks for evaluation before starting cycle #6.  For the odynophagia, the patient will continue his current treatment with Carafate. The patient was advised to call immediately if he has any concerning symptoms in the interval. The patient voices understanding of current disease status and treatment options and is in agreement with the current care plan.  All questions were answered. The patient knows to call the clinic with any problems, questions or concerns. We can certainly see the patient much sooner if necessary.  Disclaimer: This note was dictated with voice recognition software. Similar sounding words can inadvertently be transcribed and may not be corrected upon review.

## 2019-12-17 ENCOUNTER — Telehealth: Payer: Self-pay | Admitting: Internal Medicine

## 2019-12-17 ENCOUNTER — Ambulatory Visit
Admission: RE | Admit: 2019-12-17 | Discharge: 2019-12-17 | Disposition: A | Discharge status: Home / Self Care | Payer: Managed Care, Other (non HMO) | Source: Ambulatory Visit | Attending: Radiation Oncology | Admitting: Radiation Oncology

## 2019-12-17 ENCOUNTER — Other Ambulatory Visit: Payer: Self-pay

## 2019-12-17 DIAGNOSIS — Z5111 Encounter for antineoplastic chemotherapy: Secondary | ICD-10-CM | POA: Diagnosis not present

## 2019-12-17 NOTE — Telephone Encounter (Signed)
Scheduled per los. Called and left msg. Mailed printout  °

## 2019-12-18 ENCOUNTER — Other Ambulatory Visit: Payer: Self-pay

## 2019-12-18 ENCOUNTER — Ambulatory Visit
Admission: RE | Admit: 2019-12-18 | Discharge: 2019-12-18 | Disposition: A | Discharge status: Home / Self Care | Payer: Managed Care, Other (non HMO) | Source: Ambulatory Visit | Attending: Radiation Oncology | Admitting: Radiation Oncology

## 2019-12-18 DIAGNOSIS — Z5111 Encounter for antineoplastic chemotherapy: Secondary | ICD-10-CM | POA: Diagnosis not present

## 2019-12-19 ENCOUNTER — Other Ambulatory Visit: Payer: Self-pay

## 2019-12-19 ENCOUNTER — Ambulatory Visit
Admission: RE | Admit: 2019-12-19 | Discharge: 2019-12-19 | Disposition: A | Discharge status: Home / Self Care | Payer: Managed Care, Other (non HMO) | Source: Ambulatory Visit | Attending: Radiation Oncology | Admitting: Radiation Oncology

## 2019-12-19 DIAGNOSIS — Z5111 Encounter for antineoplastic chemotherapy: Secondary | ICD-10-CM | POA: Diagnosis not present

## 2019-12-20 ENCOUNTER — Other Ambulatory Visit: Payer: Self-pay

## 2019-12-20 ENCOUNTER — Ambulatory Visit
Admission: RE | Admit: 2019-12-20 | Discharge: 2019-12-20 | Disposition: A | Discharge status: Home / Self Care | Payer: Managed Care, Other (non HMO) | Source: Ambulatory Visit | Attending: Radiation Oncology | Admitting: Radiation Oncology

## 2019-12-20 DIAGNOSIS — Z5111 Encounter for antineoplastic chemotherapy: Secondary | ICD-10-CM | POA: Diagnosis not present

## 2019-12-23 ENCOUNTER — Ambulatory Visit
Admission: RE | Admit: 2019-12-23 | Discharge: 2019-12-23 | Disposition: A | Discharge status: Home / Self Care | Payer: Managed Care, Other (non HMO) | Source: Ambulatory Visit | Attending: Radiation Oncology | Admitting: Radiation Oncology

## 2019-12-23 ENCOUNTER — Inpatient Hospital Stay: Payer: Managed Care, Other (non HMO)

## 2019-12-23 ENCOUNTER — Other Ambulatory Visit: Payer: Self-pay

## 2019-12-23 VITALS — BP 115/75 | HR 88 | Temp 98.1°F | Resp 18

## 2019-12-23 DIAGNOSIS — Z5111 Encounter for antineoplastic chemotherapy: Secondary | ICD-10-CM | POA: Diagnosis not present

## 2019-12-23 DIAGNOSIS — Z95828 Presence of other vascular implants and grafts: Secondary | ICD-10-CM

## 2019-12-23 DIAGNOSIS — C3491 Malignant neoplasm of unspecified part of right bronchus or lung: Secondary | ICD-10-CM

## 2019-12-23 LAB — CBC WITH DIFFERENTIAL (CANCER CENTER ONLY)
Abs Immature Granulocytes: 0.22 10*3/uL — ABNORMAL HIGH (ref 0.00–0.07)
Basophils Absolute: 0 10*3/uL (ref 0.0–0.1)
Basophils Relative: 1 %
Eosinophils Absolute: 0 10*3/uL (ref 0.0–0.5)
Eosinophils Relative: 0 %
HCT: 36 % — ABNORMAL LOW (ref 39.0–52.0)
Hemoglobin: 12 g/dL — ABNORMAL LOW (ref 13.0–17.0)
Immature Granulocytes: 4 %
Lymphocytes Relative: 18 %
Lymphs Abs: 1 10*3/uL (ref 0.7–4.0)
MCH: 30.3 pg (ref 26.0–34.0)
MCHC: 33.3 g/dL (ref 30.0–36.0)
MCV: 90.9 fL (ref 80.0–100.0)
Monocytes Absolute: 0.5 10*3/uL (ref 0.1–1.0)
Monocytes Relative: 9 %
Neutro Abs: 3.8 10*3/uL (ref 1.7–7.7)
Neutrophils Relative %: 68 %
Platelet Count: 232 10*3/uL (ref 150–400)
RBC: 3.96 MIL/uL — ABNORMAL LOW (ref 4.22–5.81)
RDW: 17.6 % — ABNORMAL HIGH (ref 11.5–15.5)
WBC Count: 5.6 10*3/uL (ref 4.0–10.5)
nRBC: 0 % (ref 0.0–0.2)

## 2019-12-23 LAB — CMP (CANCER CENTER ONLY)
ALT: 18 U/L (ref 0–44)
AST: 12 U/L — ABNORMAL LOW (ref 15–41)
Albumin: 3.5 g/dL (ref 3.5–5.0)
Alkaline Phosphatase: 52 U/L (ref 38–126)
Anion gap: 7 (ref 5–15)
BUN: 7 mg/dL (ref 6–20)
CO2: 25 mmol/L (ref 22–32)
Calcium: 9.5 mg/dL (ref 8.9–10.3)
Chloride: 103 mmol/L (ref 98–111)
Creatinine: 0.76 mg/dL (ref 0.61–1.24)
GFR, Est AFR Am: >60 mL/min (ref >60)
GFR, Estimated: >60 mL/min (ref >60)
Glucose, Bld: 123 mg/dL — ABNORMAL HIGH (ref 70–99)
Potassium: 3.6 mmol/L (ref 3.5–5.1)
Sodium: 135 mmol/L (ref 135–145)
Total Bilirubin: 0.4 mg/dL (ref 0.3–1.2)
Total Protein: 6.5 g/dL (ref 6.5–8.1)

## 2019-12-23 MED ORDER — PALONOSETRON HCL INJECTION 0.25 MG/5ML
0.2500 mg | Freq: Once | INTRAVENOUS | Status: AC
  Administered 2019-12-23: 0.25 mg via INTRAVENOUS

## 2019-12-23 MED ORDER — SODIUM CHLORIDE 0.9% FLUSH
10.0000 mL | Freq: Once | INTRAVENOUS | Status: AC
  Administered 2019-12-23: 10 mL
  Filled 2019-12-23: qty 10

## 2019-12-23 MED ORDER — PALONOSETRON HCL INJECTION 0.25 MG/5ML
INTRAVENOUS | Status: AC
  Filled 2019-12-23: qty 5

## 2019-12-23 MED ORDER — SODIUM CHLORIDE 0.9 % IV SOLN
45.0000 mg/m2 | Freq: Once | INTRAVENOUS | Status: AC
  Administered 2019-12-23: 90 mg via INTRAVENOUS
  Filled 2019-12-23: qty 15

## 2019-12-23 MED ORDER — SODIUM CHLORIDE 0.9 % IV SOLN
Freq: Once | INTRAVENOUS | Status: AC
  Filled 2019-12-23: qty 250

## 2019-12-23 MED ORDER — SODIUM CHLORIDE 0.9% FLUSH
10.0000 mL | INTRAVENOUS | Status: DC | PRN
  Administered 2019-12-23: 10 mL
  Filled 2019-12-23: qty 10

## 2019-12-23 MED ORDER — DIPHENHYDRAMINE HCL 50 MG/ML IJ SOLN
INTRAMUSCULAR | Status: AC
  Filled 2019-12-23: qty 1

## 2019-12-23 MED ORDER — DIPHENHYDRAMINE HCL 50 MG/ML IJ SOLN
50.0000 mg | Freq: Once | INTRAMUSCULAR | Status: AC
  Administered 2019-12-23: 50 mg via INTRAVENOUS

## 2019-12-23 MED ORDER — SODIUM CHLORIDE 0.9 % IV SOLN
250.0000 mg | Freq: Once | INTRAVENOUS | Status: AC
  Administered 2019-12-23: 250 mg via INTRAVENOUS
  Filled 2019-12-23: qty 25

## 2019-12-23 MED ORDER — FAMOTIDINE IN NACL 20-0.9 MG/50ML-% IV SOLN
INTRAVENOUS | Status: AC
  Filled 2019-12-23: qty 50

## 2019-12-23 MED ORDER — HEPARIN SOD (PORK) LOCK FLUSH 100 UNIT/ML IV SOLN
500.0000 [IU] | Freq: Once | INTRAVENOUS | Status: AC | PRN
  Administered 2019-12-23: 500 [IU]
  Filled 2019-12-23: qty 5

## 2019-12-23 MED ORDER — SODIUM CHLORIDE 0.9 % IV SOLN
20.0000 mg | Freq: Once | INTRAVENOUS | Status: AC
  Administered 2019-12-23: 20 mg via INTRAVENOUS
  Filled 2019-12-23: qty 20

## 2019-12-23 MED ORDER — FAMOTIDINE IN NACL 20-0.9 MG/50ML-% IV SOLN
20.0000 mg | Freq: Once | INTRAVENOUS | Status: AC
  Administered 2019-12-23: 20 mg via INTRAVENOUS

## 2019-12-23 NOTE — Patient Instructions (Signed)

## 2019-12-23 NOTE — Patient Instructions (Signed)
[facility] Discharge Instructions for Patients Receiving Chemotherapy  Today you received the following chemotherapy agents: taxol, carboplatin   To help prevent nausea and vomiting after your treatment, we encourage you to take your nausea medication as directed.    If you develop nausea and vomiting that is not controlled by your nausea medication, call the clinic.   BELOW ARE SYMPTOMS THAT SHOULD BE REPORTED IMMEDIATELY:  *FEVER GREATER THAN 100.5 F  *CHILLS WITH OR WITHOUT FEVER  NAUSEA AND VOMITING THAT IS NOT CONTROLLED WITH YOUR NAUSEA MEDICATION  *UNUSUAL SHORTNESS OF BREATH  *UNUSUAL BRUISING OR BLEEDING  TENDERNESS IN MOUTH AND THROAT WITH OR WITHOUT PRESENCE OF ULCERS  *URINARY PROBLEMS  *BOWEL PROBLEMS  UNUSUAL RASH Items with * indicate a potential emergency and should be followed up as soon as possible.  Feel free to call the clinic should you have any questions or concerns. The clinic phone number is [phone].  Please show the [facility] at check-in to the Emergency Department and triage nurse.

## 2019-12-24 ENCOUNTER — Ambulatory Visit
Admission: RE | Admit: 2019-12-24 | Discharge: 2019-12-24 | Disposition: A | Discharge status: Home / Self Care | Payer: Managed Care, Other (non HMO) | Source: Ambulatory Visit | Attending: Radiation Oncology | Admitting: Radiation Oncology

## 2019-12-24 DIAGNOSIS — Z5111 Encounter for antineoplastic chemotherapy: Secondary | ICD-10-CM | POA: Diagnosis not present

## 2019-12-24 NOTE — Progress Notes (Signed)
[facility] OFFICE PROGRESS NOTE  [name], NP [address] [name] [address] [place] [address]  DIAGNOSIS: Stage IV carcinoma,non-small cell lung cancer, adenocarcinoma.The patient presented with a right lower lobe/infrahilar mass as well as a solitary brain metastasis in the left cerebellum. He was diagnosed in July 2021.  Molecular Biomarkers:  MSI-High DETECTED Pembrolizumab Atezolizumab, Avelumab, Cemiplimab, Dostarlimab, Durvalumab, Ipilimumab, Nivolumab  STK11Splice Site SNV 4.2% Everolimus, Temsirolimus Yes  KRASG12D 1.7% Binimetinib Yes  [id number] 0.4%  Niraparib, Olaparib, Rucaparib, Talazoparib, Tazemetostat Yes   PRIOR THERAPY: 1) SRS to the solitary brain metastasis under the care of Dr. Lisbeth Renshaw. Last treatment 11/14/19  CURRENT THERAPY: Weekly concurrent chemoradiation with carboplatin for an AUC of 2, paclitaxel 45 mg per metered squared.First dose expected on 11/25/2019. Status post 5 cycles.   INTERVAL HISTORY: KNIGHT OELKERS 59 y.o. male returns to the clinic today for a follow up visit accompanied by his wife. The patient is feeling unwell today. He completed his steroid taper on 12/24/19 for his history of treated brain metastasis and since that time he has been having headaches in the occipital region that he has been waking up with as well as nausea and vomiting. He vomited this morning prior to coming to his appointment 3x. He has been taking tylenol for headaches this weekend. Additionally, he is starting to experience esophagitis from his radiation. He has a prescription for carafate at home. He lost 4 lbs since last week due to decreased appetite and nausea. He also notes extremely low energy.  Otherwise, he had been tolerating the chemotherapy portion of his treatment well.  He denies any fever, chills, or night sweats. He denies any hemoptysis. He denies significant shortness of breath but reports cough and heart  burn. He denies diarrhea or constipation. He is here today for evaluation before starting cycle #6.   MEDICAL HISTORY: Past Medical History:  Diagnosis Date  . Cancer ([facility])    lung cancer  . Diverticulosis   . GERD (gastroesophageal reflux disease)   . Hx of small bowel obstruction   . Hyperlipidemia   . Hypothyroidism   . IBS (irritable bowel syndrome)   . Substance abuse ([facility])    Alcoholic, Drug addition  . Thyroid disease     ALLERGIES:  is allergic to penicillins.  MEDICATIONS:  Current Outpatient Medications  Medication Sig Dispense Refill  . albuterol (PROVENTIL HFA;VENTOLIN HFA) 108 (90 Base) MCG/ACT inhaler Inhale 2 puffs into the lungs every 6 (six) hours as needed for wheezing or shortness of breath. (Patient not taking: Reported on 12/16/2019) 1 Inhaler 0  . ANORO ELLIPTA 62.5-25 MCG/INH AEPB 1 puff daily.    . cyclobenzaprine (FLEXERIL) 10 MG tablet Take 10 mg by mouth 2 (two) times daily as needed for muscle spasms. (Patient not taking: Reported on 12/16/2019)    . dexamethasone (DECADRON) 4 MG tablet Take 1 tablet (4 mg total) by mouth 2 (two) times daily with a meal. 40 tablet 0  . docusate sodium (COLACE) 100 MG capsule Take 100 mg by mouth daily.    . fluticasone furoate-vilanterol (BREO ELLIPTA) 100-25 MCG/INH AEPB Inhale 1 puff into the lungs daily. 30 each 3  . levothyroxine (SYNTHROID) 75 MCG tablet Take 75 mcg by mouth daily.    Marland Kitchen lidocaine (XYLOCAINE) 2 % solution Use as directed 15 mLs in the mouth or throat every 6 (six) hours as needed for mouth pain. Do not eat/drink within 60 minutes of taking this medication 100  mL 0  . lidocaine-prilocaine (EMLA) cream Apply 1 application topically as needed. 30 g 0  . omeprazole (PRILOSEC) 40 MG capsule Take 1 capsule (40 mg total) by mouth daily. 90 capsule 4  . ondansetron (ZOFRAN) 4 MG tablet Take 4 mg by mouth every 8 (eight) hours as needed. (Patient not taking: Reported on 12/16/2019)    . oxyCODONE-acetaminophen  (PERCOCET/ROXICET) 5-325 MG tablet Take 1 tablet by mouth every 4 (four) hours as needed for severe pain. (Patient not taking: Reported on 12/16/2019) 30 tablet 0  . polyethylene glycol powder (MIRALAX) powder Take 17 g by mouth daily. 255 g 11  . prochlorperazine (COMPAZINE) 10 MG tablet Take 1 tablet (10 mg total) by mouth every 6 (six) hours as needed. (Patient not taking: Reported on 12/16/2019) 30 tablet 2  . sucralfate (CARAFATE) 1 g tablet Take 1 tablet (1 g total) by mouth 4 (four) times daily. Dissolve each tablet in 15 cc water before use. 120 tablet 2  . TRULANCE 3 MG TABS Take 1 tablet by mouth daily.     No current facility-administered medications for this visit.   Facility-Administered Medications Ordered in Other Visits  Medication Dose Route Frequency Provider Last Rate Last Admin  . CARBOplatin (PARAPLATIN) 250 mg in sodium chloride 0.9 % 250 mL chemo infusion  250 mg Intravenous Once Curt Bears, MD      . dexamethasone (DECADRON) 20 mg in sodium chloride 0.9 % 50 mL IVPB  20 mg Intravenous Once Curt Bears, MD      . diphenhydrAMINE (BENADRYL) injection 50 mg  50 mg Intravenous Once Curt Bears, MD      . famotidine (PEPCID) IVPB 20 mg premix  20 mg Intravenous Once Curt Bears, MD      . heparin lock flush 100 unit/mL  500 Units Intracatheter Once PRN Curt Bears, MD      . PACLitaxel (TAXOL) 90 mg in sodium chloride 0.9 % 250 mL chemo infusion (</= 27m/m2)  45 mg/m2 (Treatment Plan Recorded) Intravenous Once MCurt Bears MD      . palonosetron (ALOXI) injection 0.25 mg  0.25 mg Intravenous Once MCurt Bears MD      . sodium chloride flush (NS) 0.9 % injection 10 mL  10 mL Intracatheter PRN MCurt Bears MD        SURGICAL HISTORY:  Past Surgical History:  Procedure Laterality Date  . arm surgery Right   . BRONCHIAL BRUSHINGS  10/24/2019   Procedure: BRONCHIAL BRUSHINGS;  Surgeon: BCollene Gobble MD;  [facility];  Service:  Cardiopulmonary;;  right lower lobe   . BRONCHIAL BRUSHINGS  11/05/2019   Procedure: BRONCHIAL BRUSHINGS;  Surgeon: BCollene Gobble MD;  [facility];  Service: Pulmonary;;  . BRONCHIAL NEEDLE ASPIRATION BIOPSY  10/24/2019   Procedure: BRONCHIAL NEEDLE ASPIRATION BIOPSIES;  Surgeon: BCollene Gobble MD;  [facility]  Service: Cardiopulmonary;;  . BRONCHIAL NEEDLE ASPIRATION BIOPSY  11/05/2019   Procedure: BRONCHIAL NEEDLE ASPIRATION BIOPSIES;  Surgeon: BCollene Gobble MD;  [facility];  Service: Pulmonary;;  . ENDOBRONCHIAL ULTRASOUND N/A 10/24/2019   Procedure: ENDOBRONCHIAL ULTRASOUND;  Surgeon: BCollene Gobble MD;  [facility]  Service: Cardiopulmonary;  Laterality: N/A;  . FINGER SURGERY Right    Middle  . IR IMAGING GUIDED PORT INSERTION  11/19/2019  . VIDEO BRONCHOSCOPY N/A 10/24/2019   Procedure: VIDEO BRONCHOSCOPY WITHOUT FLUORO;  Surgeon: BCollene Gobble MD;  [facility];  Service: Cardiopulmonary;  Laterality: N/A;  .  VIDEO BRONCHOSCOPY WITH ENDOBRONCHIAL NAVIGATION N/A 11/05/2019   Procedure: VIDEO BRONCHOSCOPY WITH ENDOBRONCHIAL NAVIGATION;  Surgeon: Collene Gobble, MD;  Location: [facility];  Service: Pulmonary;  Laterality: N/A;    REVIEW OF SYSTEMS:   Review of Systems  Constitutional: Positive for fatigue, appetite change, and weight loss. Negative for chills, fever and unexpected weight change.  HENT: Positive for esophagitis. Negative for mouth sores, nosebleeds, and trouble swallowing.   Eyes: Negative for eye problems and icterus.  Respiratory: Positive for cough. Negative for hemoptysis, shortness of breath and wheezing.   Cardiovascular: Positive for heart burn. Negative for leg swelling.  Gastrointestinal: Positive for nausea/vomiting. Negative for abdominal pain, constipation,or diarrhea. Genitourinary: Negative for bladder incontinence, difficulty urinating, dysuria, frequency and hematuria.   Musculoskeletal:  Negative for back pain, gait problem, neck pain and neck stiffness.  Skin: Negative for itching and rash.  Neurological: Negative for dizziness, extremity weakness, gait problem, headaches, light-headedness and seizures.  Hematological: Negative for adenopathy. Does not bruise/bleed easily.  Psychiatric/Behavioral: Negative for confusion, depression and sleep disturbance. The patient is not nervous/anxious.     PHYSICAL EXAMINATION:  Blood pressure (!) 124/92, pulse (!) 114, temperature 97.8 F (36.6 C), temperature source Tympanic, resp. rate 18, height _0  (1.727 m), weight 169 lb 11.2 oz (77 kg), SpO2 100 %.  ECOG PERFORMANCE STATUS: 1 - Symptomatic but completely ambulatory  Physical Exam  Constitutional: Oriented to person, place, and time and well-developed, well-nourished, and in no distress.  HENT:  Head: Normocephalic and atraumatic.  Mouth/Throat: Oropharynx is clear and moist. No oropharyngeal exudate.  Eyes: Conjunctivae are normal. Right eye exhibits no discharge. Left eye exhibits no discharge. No scleral icterus.  Neck: Normal range of motion. Neck supple.  Cardiovascular: Tachycardia, regular rhythm, normal heart sounds and intact distal pulses.   Pulmonary/Chest: Effort normal and breath sounds normal. No respiratory distress. No wheezes. No rales.  Abdominal: Soft. Bowel sounds are normal. Exhibits no distension and no mass. There is no tenderness.  Musculoskeletal: Normal range of motion. Exhibits no edema.  Lymphadenopathy:    No cervical adenopathy.  Neurological: Alert and oriented to person, place, and time. Exhibits normal muscle tone. Gait normal. Coordination normal.  Skin: Skin is warm and dry. No rash noted. Not diaphoretic. No erythema. No pallor.  Psychiatric: Mood, memory and judgment normal.  Vitals reviewed.  LABORATORY DATA: Lab Results  Component Value Date   WBC 7.8 12/31/2019   HGB 13.3 12/31/2019   HCT 39.0 12/31/2019   MCV 90.5 12/31/2019    PLT 320 12/31/2019      Chemistry      Component Value Date/Time   NA 138 12/31/2019 0810   NA 141 03/08/2017 1707   K 3.9 12/31/2019 0810   CL 104 12/31/2019 0810   CO2 24 12/31/2019 0810   BUN 10 12/31/2019 0810   BUN 12 03/08/2017 1707   CREATININE 0.78 12/31/2019 0810      Component Value Date/Time   CALCIUM 9.7 12/31/2019 0810   ALKPHOS 59 12/31/2019 0810   AST 12 (L) 12/31/2019 0810   ALT 15 12/31/2019 0810   BILITOT 0.6 12/31/2019 0810       RADIOGRAPHIC STUDIES:  No results found.   ASSESSMENT/PLAN:  This is a very pleasant 59 year old Caucasian male diagnosed with stage IVnon-small cell lung cancer, adenocarcinoma.The patient presented with a right lower lobe/infrahilar mass as well as a solitary brain metastasis in the left cerebellum. He was diagnosed in July 2021.His molecular studies by [place]  [facility] show he has MSI high which is a good marker for response to immunotherapy which will be important for future treatment.   The patientwillcomplete SRS to the solidary brain metastasis under the care of Dr. Colin Mulders today on7/22/21.   He is currently undergoing carboplatin for an AUC of 2 and paclitaxel 45 mg/m2 weekly with concurrent radiation. He is status post 5 cycles and tolerated it well.    Labs were reviewed. Recommend that he proceed with cycle #6 today as scheduled.   We will see him back for a follow up visit in 1 weeks for evaluation before starting cycle #7.  Regarding his headaches, nausea, and vomiting, likely secondary to his prior treated brain mets. Will reach out to radiation oncology regarding recommendations for possibly restarting his decadron.   The patient dropped off paperwork for FMLA last week. Discussed this will take 7-10 business days.   The patient was advised to call immediately if he has any concerning symptoms in the interval. The patient voices understanding of current disease status and treatment options and is  in agreement with the current care plan. All questions were answered. The patient knows to call the clinic with any problems, questions or concerns. We can certainly see the patient much sooner if necessary     Orders Placed This Encounter  Procedures  . CBC with Differential (Cancer Center Only)    Standing Status:   Future    Standing Expiration Date:   12/30/2020  . CMP ([facility] only)    Standing Status:   Future    Standing Expiration Date:   12/30/2020     Lilley Hubble L Ellah Otte, PA-C 12/31/19

## 2019-12-25 ENCOUNTER — Ambulatory Visit
Admission: RE | Admit: 2019-12-25 | Discharge: 2019-12-25 | Disposition: A | Discharge status: Home / Self Care | Payer: Managed Care, Other (non HMO) | Source: Ambulatory Visit | Attending: Radiation Oncology | Admitting: Radiation Oncology

## 2019-12-25 DIAGNOSIS — F1010 Alcohol abuse, uncomplicated: Secondary | ICD-10-CM | POA: Diagnosis not present

## 2019-12-25 DIAGNOSIS — K589 Irritable bowel syndrome without diarrhea: Secondary | ICD-10-CM | POA: Insufficient documentation

## 2019-12-25 DIAGNOSIS — E039 Hypothyroidism, unspecified: Secondary | ICD-10-CM | POA: Insufficient documentation

## 2019-12-25 DIAGNOSIS — E785 Hyperlipidemia, unspecified: Secondary | ICD-10-CM | POA: Insufficient documentation

## 2019-12-25 DIAGNOSIS — F1910 Other psychoactive substance abuse, uncomplicated: Secondary | ICD-10-CM | POA: Diagnosis not present

## 2019-12-25 DIAGNOSIS — M7989 Other specified soft tissue disorders: Secondary | ICD-10-CM | POA: Diagnosis not present

## 2019-12-25 DIAGNOSIS — Z510 Encounter for antineoplastic radiation therapy: Secondary | ICD-10-CM | POA: Insufficient documentation

## 2019-12-25 DIAGNOSIS — R51 Headache with orthostatic component, not elsewhere classified: Secondary | ICD-10-CM | POA: Diagnosis not present

## 2019-12-25 DIAGNOSIS — G4700 Insomnia, unspecified: Secondary | ICD-10-CM | POA: Insufficient documentation

## 2019-12-25 DIAGNOSIS — K208 Other esophagitis without bleeding: Secondary | ICD-10-CM | POA: Diagnosis not present

## 2019-12-25 DIAGNOSIS — C3431 Malignant neoplasm of lower lobe, right bronchus or lung: Secondary | ICD-10-CM | POA: Diagnosis present

## 2019-12-25 DIAGNOSIS — J439 Emphysema, unspecified: Secondary | ICD-10-CM | POA: Insufficient documentation

## 2019-12-25 DIAGNOSIS — Z79899 Other long term (current) drug therapy: Secondary | ICD-10-CM | POA: Insufficient documentation

## 2019-12-25 DIAGNOSIS — Z5111 Encounter for antineoplastic chemotherapy: Secondary | ICD-10-CM | POA: Insufficient documentation

## 2019-12-25 DIAGNOSIS — R112 Nausea with vomiting, unspecified: Secondary | ICD-10-CM | POA: Diagnosis not present

## 2019-12-25 DIAGNOSIS — K219 Gastro-esophageal reflux disease without esophagitis: Secondary | ICD-10-CM | POA: Insufficient documentation

## 2019-12-25 DIAGNOSIS — R1310 Dysphagia, unspecified: Secondary | ICD-10-CM | POA: Insufficient documentation

## 2019-12-25 DIAGNOSIS — C7931 Secondary malignant neoplasm of brain: Secondary | ICD-10-CM | POA: Insufficient documentation

## 2019-12-25 DIAGNOSIS — M79642 Pain in left hand: Secondary | ICD-10-CM | POA: Diagnosis not present

## 2019-12-26 ENCOUNTER — Ambulatory Visit
Admission: RE | Admit: 2019-12-26 | Discharge: 2019-12-26 | Disposition: A | Discharge status: Home / Self Care | Payer: Managed Care, Other (non HMO) | Source: Ambulatory Visit | Attending: Radiation Oncology | Admitting: Radiation Oncology

## 2019-12-26 DIAGNOSIS — C7931 Secondary malignant neoplasm of brain: Secondary | ICD-10-CM | POA: Diagnosis not present

## 2019-12-27 ENCOUNTER — Other Ambulatory Visit: Payer: Self-pay

## 2019-12-27 ENCOUNTER — Ambulatory Visit
Admission: RE | Admit: 2019-12-27 | Discharge: 2019-12-27 | Disposition: A | Discharge status: Home / Self Care | Payer: Managed Care, Other (non HMO) | Source: Ambulatory Visit | Attending: Radiation Oncology | Admitting: Radiation Oncology

## 2019-12-27 DIAGNOSIS — C7931 Secondary malignant neoplasm of brain: Secondary | ICD-10-CM | POA: Diagnosis not present

## 2019-12-31 ENCOUNTER — Encounter: Payer: Self-pay | Admitting: Radiation Oncology

## 2019-12-31 ENCOUNTER — Inpatient Hospital Stay (HOSPITAL_BASED_OUTPATIENT_CLINIC_OR_DEPARTMENT_OTHER): Payer: Managed Care, Other (non HMO) | Admitting: Physician Assistant

## 2019-12-31 ENCOUNTER — Ambulatory Visit: Payer: Managed Care, Other (non HMO)

## 2019-12-31 ENCOUNTER — Telehealth: Payer: Self-pay | Admitting: *Deleted

## 2019-12-31 ENCOUNTER — Ambulatory Visit
Admission: RE | Admit: 2019-12-31 | Discharge: 2019-12-31 | Disposition: A | Discharge status: Home / Self Care | Payer: Managed Care, Other (non HMO) | Source: Ambulatory Visit | Attending: Radiation Oncology | Admitting: Radiation Oncology

## 2019-12-31 ENCOUNTER — Other Ambulatory Visit: Payer: Self-pay

## 2019-12-31 ENCOUNTER — Inpatient Hospital Stay: Payer: Managed Care, Other (non HMO)

## 2019-12-31 ENCOUNTER — Inpatient Hospital Stay: Payer: Managed Care, Other (non HMO) | Attending: Physician Assistant

## 2019-12-31 ENCOUNTER — Other Ambulatory Visit: Payer: Self-pay | Admitting: Radiation Oncology

## 2019-12-31 VITALS — BP 124/92 | HR 114 | Temp 97.8°F | Resp 18 | Ht 68.0 in | Wt 169.7 lb

## 2019-12-31 VITALS — HR 83

## 2019-12-31 DIAGNOSIS — Z5111 Encounter for antineoplastic chemotherapy: Secondary | ICD-10-CM | POA: Diagnosis not present

## 2019-12-31 DIAGNOSIS — C3491 Malignant neoplasm of unspecified part of right bronchus or lung: Secondary | ICD-10-CM | POA: Diagnosis not present

## 2019-12-31 DIAGNOSIS — R112 Nausea with vomiting, unspecified: Secondary | ICD-10-CM | POA: Insufficient documentation

## 2019-12-31 DIAGNOSIS — M79642 Pain in left hand: Secondary | ICD-10-CM | POA: Insufficient documentation

## 2019-12-31 DIAGNOSIS — C3431 Malignant neoplasm of lower lobe, right bronchus or lung: Secondary | ICD-10-CM | POA: Insufficient documentation

## 2019-12-31 DIAGNOSIS — E785 Hyperlipidemia, unspecified: Secondary | ICD-10-CM | POA: Insufficient documentation

## 2019-12-31 DIAGNOSIS — R51 Headache with orthostatic component, not elsewhere classified: Secondary | ICD-10-CM | POA: Insufficient documentation

## 2019-12-31 DIAGNOSIS — M7989 Other specified soft tissue disorders: Secondary | ICD-10-CM | POA: Insufficient documentation

## 2019-12-31 DIAGNOSIS — C7931 Secondary malignant neoplasm of brain: Secondary | ICD-10-CM | POA: Insufficient documentation

## 2019-12-31 DIAGNOSIS — K208 Other esophagitis without bleeding: Secondary | ICD-10-CM | POA: Insufficient documentation

## 2019-12-31 DIAGNOSIS — Z95828 Presence of other vascular implants and grafts: Secondary | ICD-10-CM

## 2019-12-31 DIAGNOSIS — F1010 Alcohol abuse, uncomplicated: Secondary | ICD-10-CM | POA: Insufficient documentation

## 2019-12-31 DIAGNOSIS — Z510 Encounter for antineoplastic radiation therapy: Secondary | ICD-10-CM | POA: Insufficient documentation

## 2019-12-31 DIAGNOSIS — F1910 Other psychoactive substance abuse, uncomplicated: Secondary | ICD-10-CM | POA: Insufficient documentation

## 2019-12-31 DIAGNOSIS — E039 Hypothyroidism, unspecified: Secondary | ICD-10-CM | POA: Insufficient documentation

## 2019-12-31 DIAGNOSIS — Z79899 Other long term (current) drug therapy: Secondary | ICD-10-CM | POA: Insufficient documentation

## 2019-12-31 LAB — CBC WITH DIFFERENTIAL (CANCER CENTER ONLY)
Abs Immature Granulocytes: 0.38 10*3/uL — ABNORMAL HIGH (ref 0.00–0.07)
Basophils Absolute: 0.1 10*3/uL (ref 0.0–0.1)
Basophils Relative: 1 %
Eosinophils Absolute: 0 10*3/uL (ref 0.0–0.5)
Eosinophils Relative: 0 %
HCT: 39 % (ref 39.0–52.0)
Hemoglobin: 13.3 g/dL (ref 13.0–17.0)
Immature Granulocytes: 5 %
Lymphocytes Relative: 9 %
Lymphs Abs: 0.7 10*3/uL (ref 0.7–4.0)
MCH: 30.9 pg (ref 26.0–34.0)
MCHC: 34.1 g/dL (ref 30.0–36.0)
MCV: 90.5 fL (ref 80.0–100.0)
Monocytes Absolute: 1 10*3/uL (ref 0.1–1.0)
Monocytes Relative: 13 %
Neutro Abs: 5.7 10*3/uL (ref 1.7–7.7)
Neutrophils Relative %: 72 %
Platelet Count: 320 10*3/uL (ref 150–400)
RBC: 4.31 MIL/uL (ref 4.22–5.81)
RDW: 18.6 % — ABNORMAL HIGH (ref 11.5–15.5)
WBC Count: 7.8 10*3/uL (ref 4.0–10.5)
nRBC: 0.3 % — ABNORMAL HIGH (ref 0.0–0.2)

## 2019-12-31 LAB — CMP (CANCER CENTER ONLY)
ALT: 15 U/L (ref 0–44)
AST: 12 U/L — ABNORMAL LOW (ref 15–41)
Albumin: 3.9 g/dL (ref 3.5–5.0)
Alkaline Phosphatase: 59 U/L (ref 38–126)
Anion gap: 10 (ref 5–15)
BUN: 10 mg/dL (ref 6–20)
CO2: 24 mmol/L (ref 22–32)
Calcium: 9.7 mg/dL (ref 8.9–10.3)
Chloride: 104 mmol/L (ref 98–111)
Creatinine: 0.78 mg/dL (ref 0.61–1.24)
GFR, Est AFR Am: >60 mL/min (ref >60)
GFR, Estimated: >60 mL/min (ref >60)
Glucose, Bld: 118 mg/dL — ABNORMAL HIGH (ref 70–99)
Potassium: 3.9 mmol/L (ref 3.5–5.1)
Sodium: 138 mmol/L (ref 135–145)
Total Bilirubin: 0.6 mg/dL (ref 0.3–1.2)
Total Protein: 7.5 g/dL (ref 6.5–8.1)

## 2019-12-31 MED ORDER — SODIUM CHLORIDE 0.9 % IV SOLN
Freq: Once | INTRAVENOUS | Status: AC
  Filled 2019-12-31: qty 250

## 2019-12-31 MED ORDER — SODIUM CHLORIDE 0.9 % IV SOLN
45.0000 mg/m2 | Freq: Once | INTRAVENOUS | Status: AC
  Administered 2019-12-31: 90 mg via INTRAVENOUS
  Filled 2019-12-31: qty 15

## 2019-12-31 MED ORDER — HEPARIN SOD (PORK) LOCK FLUSH 100 UNIT/ML IV SOLN
500.0000 [IU] | Freq: Once | INTRAVENOUS | Status: AC | PRN
  Administered 2019-12-31: 500 [IU]
  Filled 2019-12-31: qty 5

## 2019-12-31 MED ORDER — DIPHENHYDRAMINE HCL 50 MG/ML IJ SOLN
50.0000 mg | Freq: Once | INTRAMUSCULAR | Status: AC
  Administered 2019-12-31: 50 mg via INTRAVENOUS

## 2019-12-31 MED ORDER — PALONOSETRON HCL INJECTION 0.25 MG/5ML
INTRAVENOUS | Status: AC
  Filled 2019-12-31: qty 5

## 2019-12-31 MED ORDER — SODIUM CHLORIDE 0.9% FLUSH
10.0000 mL | INTRAVENOUS | Status: DC | PRN
  Administered 2019-12-31: 10 mL
  Filled 2019-12-31: qty 10

## 2019-12-31 MED ORDER — FAMOTIDINE IN NACL 20-0.9 MG/50ML-% IV SOLN
INTRAVENOUS | Status: AC
  Filled 2019-12-31: qty 50

## 2019-12-31 MED ORDER — SODIUM CHLORIDE 0.9 % IV SOLN
20.0000 mg | Freq: Once | INTRAVENOUS | Status: AC
  Administered 2019-12-31: 20 mg via INTRAVENOUS
  Filled 2019-12-31: qty 20

## 2019-12-31 MED ORDER — SODIUM CHLORIDE 0.9% FLUSH
10.0000 mL | Freq: Once | INTRAVENOUS | Status: AC
  Administered 2019-12-31: 10 mL
  Filled 2019-12-31: qty 10

## 2019-12-31 MED ORDER — LIDOCAINE VISCOUS HCL 2 % MT SOLN: 15.0000 mL | Freq: Four times a day (QID) | OROMUCOSAL | 0 refills | Status: DC | PRN

## 2019-12-31 MED ORDER — DIPHENHYDRAMINE HCL 50 MG/ML IJ SOLN
INTRAMUSCULAR | Status: AC
  Filled 2019-12-31: qty 1

## 2019-12-31 MED ORDER — PALONOSETRON HCL INJECTION 0.25 MG/5ML
0.2500 mg | Freq: Once | INTRAVENOUS | Status: AC
  Administered 2019-12-31: 0.25 mg via INTRAVENOUS

## 2019-12-31 MED ORDER — SODIUM CHLORIDE 0.9 % IV SOLN
250.0000 mg | Freq: Once | INTRAVENOUS | Status: AC
  Administered 2019-12-31: 250 mg via INTRAVENOUS
  Filled 2019-12-31: qty 25

## 2019-12-31 MED ORDER — FAMOTIDINE IN NACL 20-0.9 MG/50ML-% IV SOLN
20.0000 mg | Freq: Once | INTRAVENOUS | Status: AC
  Administered 2019-12-31: 20 mg via INTRAVENOUS

## 2019-12-31 NOTE — Progress Notes (Signed)
Patient questioning FMLA follow up.  Routed secure basket message to [facility] coordinator and spoke with Nurse whom he gave the paperwork to and she also sent follow up email to [facility] coordinator.   Patient also complaining of Throat pain, and also n/v and headaches since stopping his steroid.  Spoke with Phineas Real RN for Dr. Lisbeth Renshaw requesting follow up with patient.  She that this was brought up during PUT with Dr. Lisbeth Renshaw this past Friday.  Requested further investigation on their part and reply to patient with guidance.

## 2019-12-31 NOTE — Telephone Encounter (Signed)
Enveloped dropped off by patient 12/23/2019 received by ythis nurse 12/24/2019.  Letter requesting records only for disability extension.  Connected with Ottie Glazier of [facility] for ROI for H.I.M.  "No release on file.  Will reach out to patient and fax ROI to office."  Provided my fax number [phone] to submit request to [email or web address]. to complete process.

## 2019-12-31 NOTE — Patient Instructions (Signed)
[facility] Discharge Instructions for Patients Receiving Chemotherapy  Today you received the following chemotherapy agents: taxol, carboplatin   To help prevent nausea and vomiting after your treatment, we encourage you to take your nausea medication as directed.    If you develop nausea and vomiting that is not controlled by your nausea medication, call the clinic.   BELOW ARE SYMPTOMS THAT SHOULD BE REPORTED IMMEDIATELY:  *FEVER GREATER THAN 100.5 F  *CHILLS WITH OR WITHOUT FEVER  NAUSEA AND VOMITING THAT IS NOT CONTROLLED WITH YOUR NAUSEA MEDICATION  *UNUSUAL SHORTNESS OF BREATH  *UNUSUAL BRUISING OR BLEEDING  TENDERNESS IN MOUTH AND THROAT WITH OR WITHOUT PRESENCE OF ULCERS  *URINARY PROBLEMS  *BOWEL PROBLEMS  UNUSUAL RASH Items with * indicate a potential emergency and should be followed up as soon as possible.  Feel free to call the clinic should you have any questions or concerns. The clinic phone number is [phone].  Please show the [facility] at check-in to the Emergency Department and triage nurse.

## 2019-12-31 NOTE — Telephone Encounter (Signed)
Spoke with the patients wife to let her know we received and phone call from his medical oncology team with concerns about pain with swallowing and return or nausea/vomiting, and headaches.  She states that since he finished with his steroid taper on 12/24/2019 he has been having nausea and headaches daily.  He also continues to have pain with swallowing even with while taking the carafate he was prescribed.  After discussion with the PA, it is felt that he needs something a little stronger to help with his swallowing.  A prescription for lidocaine gel has been sent in to his pharmacy.  She was informed that this medication will numb what it comes in contact with.  She also wants him to restart his Dexamethasone at 2 mg (1/2 tablet) BID for 1 week.  She verbalized understanding and will pass this on to her husband.  She will call with any other concerns.  Will follow as necessary.  Gloriajean Dell. [name], BSN

## 2020-01-01 ENCOUNTER — Ambulatory Visit: Payer: Managed Care, Other (non HMO)

## 2020-01-01 ENCOUNTER — Ambulatory Visit
Admission: RE | Admit: 2020-01-01 | Discharge: 2020-01-01 | Disposition: A | Discharge status: Home / Self Care | Payer: Managed Care, Other (non HMO) | Source: Ambulatory Visit | Attending: Radiation Oncology | Admitting: Radiation Oncology

## 2020-01-01 DIAGNOSIS — C7931 Secondary malignant neoplasm of brain: Secondary | ICD-10-CM | POA: Diagnosis not present

## 2020-01-02 ENCOUNTER — Other Ambulatory Visit: Payer: Self-pay

## 2020-01-02 ENCOUNTER — Telehealth: Payer: Self-pay | Admitting: Radiation Oncology

## 2020-01-02 ENCOUNTER — Ambulatory Visit
Admission: RE | Admit: 2020-01-02 | Discharge: 2020-01-02 | Disposition: A | Discharge status: Home / Self Care | Payer: Managed Care, Other (non HMO) | Source: Ambulatory Visit | Attending: Radiation Oncology | Admitting: Radiation Oncology

## 2020-01-02 DIAGNOSIS — C7931 Secondary malignant neoplasm of brain: Secondary | ICD-10-CM | POA: Diagnosis not present

## 2020-01-02 NOTE — Telephone Encounter (Signed)
See nursing triage note from earlier this week. I called the patient back and left a message to check on how he's doing with his steroid administration. I asked her to call us back to let us know how he's doing.

## 2020-01-02 NOTE — Progress Notes (Signed)
  Radiation Oncology         [phone] ________________________________  Name: WESTON KALLMAN MRN: [id number]  Date: 12/31/2019  DOB: [birth year]  SIMULATION NOTE   NARRATIVE:  The patient underwent simulation today for ongoing radiation therapy.  The existing CT study set was employed for the purpose of virtual treatment planning.  The target and avoidance structures were reviewed and modified as necessary.  Treatment planning then occurred.  The radiation boost prescription was entered and confirmed.  A total of 4 complex treatment devices were fabricated in the form of multi-leaf collimators to shape radiation around the targets while maximally excluding nearby normal structures. I have requested : Isodose Plan.    PLAN:  This modified radiation beam arrangement is intended to continue the current radiation dose to an additional 6 Gy in 3 fractions for a total cumulative dose of 66 Gy.    ------------------------------------------------  Jodelle Gross, MD, PhD

## 2020-01-03 ENCOUNTER — Ambulatory Visit
Admission: RE | Admit: 2020-01-03 | Discharge: 2020-01-03 | Disposition: A | Discharge status: Home / Self Care | Payer: Managed Care, Other (non HMO) | Source: Ambulatory Visit | Attending: Radiation Oncology | Admitting: Radiation Oncology

## 2020-01-03 DIAGNOSIS — C7931 Secondary malignant neoplasm of brain: Secondary | ICD-10-CM | POA: Diagnosis not present

## 2020-01-03 NOTE — Progress Notes (Signed)
  Radiation Oncology         [phone] ________________________________  Name: Joseph Atkins  MQK:[id number]  Date of Service: 01/03/2020  DOB: [birth year]   Steroid Taper Instructions   You currently have a prescription for Dexamethasone 2 mg Tablets twice daily.   Beginning 01/10/2020 Take a 2 mg tablet daily  Beginning 01/17/2020 Take 2 mg every other day and stop on 01/28/2020.   Please call our office if you have any headaches, visual changes, uncontrolled movements, nausea or vomiting.

## 2020-01-04 NOTE — Progress Notes (Signed)
[place] OFFICE PROGRESS NOTE  Joseph Sill, NP [address] [name] [address] [place] [address]  DIAGNOSIS: Stage IV carcinoma,non-small cell lung cancer, adenocarcinoma.The patient presented with a right lower lobe/infrahilar mass as well as a solitary brain metastasis in the left cerebellum. He was diagnosed in July 2021.  Molecular Biomarkers: MSI-High DETECTED Pembrolizumab Atezolizumab, Avelumab, Cemiplimab, Dostarlimab, Durvalumab, Ipilimumab, Nivolumab  STK11Splice Site SNV 6.7% Everolimus, Temsirolimus Yes  KRASG12D 1.7% Binimetinib Yes  [id number] 0.4%  Niraparib, Olaparib, Rucaparib, Talazoparib, Tazemetostat Yes   PRIOR THERAPY: 1) SRS to the solitary brain metastasis under the care of Dr. Lisbeth Renshaw. Last treatment 11/14/19  CURRENT THERAPY:  Weekly concurrent chemoradiation with carboplatin for an AUC of 2, paclitaxel 45 mg per metered squared.First dose expected on 11/25/2019.Status post 6 cycles.  INTERVAL HISTORY: Joseph Atkins 59 y.o. male returns to the clinic today for a follow up visit accompanied by his wife. Last week, the patient was endorsing headaches, nausea/vomiting, and fatigue. The patient was restarted on decadron which has improved his symptoms.   Today, he denies fevers, chills, night sweats, or weight loss. He has lidocaine and carafate to help with the esophagitis from his radiation. Denies diarrhea or constipation. He denies any nausea or vomiting. He denies chest pain or hemoptysis. He reports a mild cough. Denies significant dyspnea on exertion. He is reporting on Friday he had pain and swelling in his left hand. The swelling has improved but makes it challenging to grip. He notes some lateral wrist tenderness as well. He denies history of arthritis. Denies recent trauma or injuries to this area.  His last day of radiation is  On 01/09/20.  He is here for evaluation before starting cycle #7.  MEDICAL  HISTORY: Past Medical History:  Diagnosis Date  . Cancer ([facility])    lung cancer  . Diverticulosis   . GERD (gastroesophageal reflux disease)   . Hx of small bowel obstruction   . Hyperlipidemia   . Hypothyroidism   . IBS (irritable bowel syndrome)   . Substance abuse ([facility])    Alcoholic, Drug addition  . Thyroid disease     ALLERGIES:  is allergic to penicillins.  MEDICATIONS:  Current Outpatient Medications  Medication Sig Dispense Refill  . albuterol (PROVENTIL HFA;VENTOLIN HFA) 108 (90 Base) MCG/ACT inhaler Inhale 2 puffs into the lungs every 6 (six) hours as needed for wheezing or shortness of breath. 1 Inhaler 0  . ANORO ELLIPTA 62.5-25 MCG/INH AEPB 1 puff daily.    Marland Kitchen dexamethasone (DECADRON) 4 MG tablet Take 1 tablet (4 mg total) by mouth 2 (two) times daily with a meal. 40 tablet 0  . docusate sodium (COLACE) 100 MG capsule Take 100 mg by mouth daily.    . fluticasone furoate-vilanterol (BREO ELLIPTA) 100-25 MCG/INH AEPB Inhale 1 puff into the lungs daily. 30 each 3  . levothyroxine (SYNTHROID) 75 MCG tablet Take 75 mcg by mouth daily.    Marland Kitchen lidocaine (XYLOCAINE) 2 % solution Use as directed 15 mLs in the mouth or throat every 6 (six) hours as needed for mouth pain. Do not eat/drink within 60 minutes of taking this medication 100 mL 0  . lidocaine-prilocaine (EMLA) cream Apply 1 application topically as needed. 30 g 0  . omeprazole (PRILOSEC) 40 MG capsule Take 1 capsule (40 mg total) by mouth daily. 90 capsule 4  . ondansetron (ZOFRAN) 4 MG tablet Take 4 mg by mouth every 8 (eight) hours as needed.     Marland Kitchen  polyethylene glycol powder (MIRALAX) powder Take 17 g by mouth daily. 255 g 11  . sucralfate (CARAFATE) 1 g tablet Take 1 tablet (1 g total) by mouth 4 (four) times daily. Dissolve each tablet in 15 cc water before use. 120 tablet 2  . TRULANCE 3 MG TABS Take 1 tablet by mouth daily.    . cyclobenzaprine (FLEXERIL) 10 MG tablet Take 10 mg by mouth 2 (two) times daily as needed  for muscle spasms. (Patient not taking: Reported on 12/16/2019)    . oxyCODONE-acetaminophen (PERCOCET/ROXICET) 5-325 MG tablet Take 1 tablet by mouth every 4 (four) hours as needed for severe pain. (Patient not taking: Reported on 12/16/2019) 30 tablet 0  . prochlorperazine (COMPAZINE) 10 MG tablet Take 1 tablet (10 mg total) by mouth every 6 (six) hours as needed. (Patient not taking: Reported on 12/16/2019) 30 tablet 2   No current facility-administered medications for this visit.   Facility-Administered Medications Ordered in Other Visits  Medication Dose Route Frequency Provider Last Rate Last Admin  . 0.9 %  sodium chloride infusion   Intravenous Once Curt Bears, MD      . CARBOplatin (PARAPLATIN) 249.8 mg in sodium chloride 0.9 % 100 mL chemo infusion  249.8 mg Intravenous Once Curt Bears, MD      . dexamethasone (DECADRON) 20 mg in sodium chloride 0.9 % 50 mL IVPB  20 mg Intravenous Once Curt Bears, MD      . diphenhydrAMINE (BENADRYL) injection 50 mg  50 mg Intravenous Once Curt Bears, MD      . famotidine (PEPCID) IVPB 20 mg premix  20 mg Intravenous Once Curt Bears, MD      . heparin lock flush 100 unit/mL  500 Units Intracatheter Once PRN Curt Bears, MD      . PACLitaxel (TAXOL) 90 mg in sodium chloride 0.9 % 250 mL chemo infusion (</= $RemoveBefor'80mg'NXWqDrNDyqBL$ /m2)  45 mg/m2 (Treatment Plan Recorded) Intravenous Once Curt Bears, MD      . palonosetron (ALOXI) injection 0.25 mg  0.25 mg Intravenous Once Curt Bears, MD      . sodium chloride flush (NS) 0.9 % injection 10 mL  10 mL Intracatheter PRN Curt Bears, MD        SURGICAL HISTORY:  Past Surgical History:  Procedure Laterality Date  . arm surgery Right   . BRONCHIAL BRUSHINGS  10/24/2019   Procedure: BRONCHIAL BRUSHINGS;  Surgeon: Collene Gobble, MD;  Location: [facility] ENDOSCOPY;  Service: Cardiopulmonary;;  right lower lobe   . BRONCHIAL BRUSHINGS  11/05/2019   Procedure: BRONCHIAL BRUSHINGS;   Surgeon: Collene Gobble, MD;  Location: [facility] ENDOSCOPY;  Service: Pulmonary;;  . BRONCHIAL NEEDLE ASPIRATION BIOPSY  10/24/2019   Procedure: BRONCHIAL NEEDLE ASPIRATION BIOPSIES;  Surgeon: Collene Gobble, MD;  Location: [place];  Service: Cardiopulmonary;;  . BRONCHIAL NEEDLE ASPIRATION BIOPSY  11/05/2019   Procedure: BRONCHIAL NEEDLE ASPIRATION BIOPSIES;  Surgeon: Collene Gobble, MD;  Location: [facility] ENDOSCOPY;  Service: Pulmonary;;  . ENDOBRONCHIAL ULTRASOUND N/A 10/24/2019   Procedure: ENDOBRONCHIAL ULTRASOUND;  Surgeon: Collene Gobble, MD;  Location: [place];  Service: Cardiopulmonary;  Laterality: N/A;  . FINGER SURGERY Right    Middle  . IR IMAGING GUIDED PORT INSERTION  11/19/2019  . VIDEO BRONCHOSCOPY N/A 10/24/2019   Procedure: VIDEO BRONCHOSCOPY WITHOUT FLUORO;  Surgeon: Collene Gobble, MD;  Location: [facility] ENDOSCOPY;  Service: Cardiopulmonary;  Laterality: N/A;  . VIDEO BRONCHOSCOPY WITH ENDOBRONCHIAL NAVIGATION N/A 11/05/2019   Procedure: VIDEO BRONCHOSCOPY WITH ENDOBRONCHIAL  NAVIGATION;  Surgeon: Collene Gobble, MD;  Location: [facility] ENDOSCOPY;  Service: Pulmonary;  Laterality: N/A;    REVIEW OF SYSTEMS:   Review of Systems  Constitutional: Negative for appetite change, chills, fatigue, fever and unexpected weight change.  HENT:  Negative for mouth sores, nosebleeds, sore throat and trouble swallowing.   Eyes: Negative for eye problems and icterus.  Respiratory: Positive for cough. Negative for hemoptysis, shortness of breath and wheezing.   Cardiovascular: Negative for chest pain and leg swelling.  Gastrointestinal: Negative for abdominal pain, constipation, diarrhea, nausea and vomiting.  Genitourinary: Negative for bladder incontinence, difficulty urinating, dysuria, frequency and hematuria.   Musculoskeletal: Positive for left wrist and hand swelling (improved from prior). Negative for back pain, gait problem, neck pain and neck stiffness.  Skin: Negative for itching and rash.   Neurological: Negative for dizziness, extremity weakness, gait problem, headaches, light-headedness and seizures.  Hematological: Negative for adenopathy. Does not bruise/bleed easily.  Psychiatric/Behavioral: Negative for confusion, depression and sleep disturbance. The patient is not nervous/anxious.     PHYSICAL EXAMINATION:  Blood pressure 117/82, pulse 99, temperature 97.9 F (36.6 C), temperature source Tympanic, resp. rate 18, height $RemoveBe'5\' 8"'lSpDGdXfp$  (1.727 m), weight 172 lb 11.2 oz (78.3 kg), SpO2 100 %.  ECOG PERFORMANCE STATUS: 1 - Symptomatic but completely ambulatory  Physical Exam  Constitutional: Oriented to person, place, and time and well-developed, well-nourished, and in no distress. No distress.  HENT:  Head: Normocephalic and atraumatic.  Mouth/Throat: Oropharynx is clear and moist. No oropharyngeal exudate.  Eyes: Conjunctivae are normal. Right eye exhibits no discharge. Left eye exhibits no discharge. No scleral icterus.  Neck: Normal range of motion. Neck supple.  Cardiovascular: Normal rate, regular rhythm, normal heart sounds and intact distal pulses.   Pulmonary/Chest: Effort normal and breath sounds normal. No respiratory distress. No wheezes. No rales.  Abdominal: Soft. Bowel sounds are normal. Exhibits no distension and no mass. There is no tenderness.  Musculoskeletal: Normal range of motion. Exhibits no edema.  Lymphadenopathy:    No cervical adenopathy.  Neurological: Alert and oriented to person, place, and time. Exhibits normal muscle tone. Gait normal. Coordination normal.  Skin: Skin is warm and dry. No rash noted. Not diaphoretic. No erythema. No pallor.  Psychiatric: Mood, memory and judgment normal.  Vitals reviewed.  LABORATORY DATA: Lab Results  Component Value Date   WBC 9.7 01/06/2020   HGB 10.8 (L) 01/06/2020   HCT 32.5 (L) 01/06/2020   MCV 93.4 01/06/2020   PLT 284 01/06/2020      Chemistry      Component Value Date/Time   NA 139 01/06/2020  0837   NA 141 03/08/2017 1707   K 3.6 01/06/2020 0837   CL 105 01/06/2020 0837   CO2 26 01/06/2020 0837   BUN 8 01/06/2020 0837   BUN 12 03/08/2017 1707   CREATININE 0.67 01/06/2020 0837      Component Value Date/Time   CALCIUM 9.4 01/06/2020 0837   ALKPHOS 52 01/06/2020 0837   AST 16 01/06/2020 0837   ALT 22 01/06/2020 0837   BILITOT 0.5 01/06/2020 0837       RADIOGRAPHIC STUDIES:  No results found.   ASSESSMENT/PLAN:  This is a very pleasant 59 year old Caucasian male diagnosed with stage IVnon-small cell lung cancer, adenocarcinoma.The patient presented with a right lower lobe/infrahilar mass as well as a solitary brain metastasis in the left cerebellum. He was diagnosed in July 2021.His molecular studies by [employer]show he has MSI high which is  a good marker for response to immunotherapy which will be important for future treatment.  The patientwillcomplete SRS to the solidary brain metastasis under the care of Dr. Colin Mulders today on7/22/21.  He is currently undergoing carboplatin for an AUC of 2 and paclitaxel 45 mg/m2 weekly with concurrent radiation. He is status post 6 cycles and tolerated it well.   Labs were reviewed. Recommend that he procced with cycle #7 today as scheduled.   His last day of radiation is scheduled for 01/09/20.   I will arrange for his restaging CT scan of the chest in 3-4 weeks.   We will see him back for a follow up visit in 3-4 weeks for evaluation and to review his scan results.   Regarding his left lateral wrist/hand swelling, this has mostly improved at this time; however, advised the patient to follow up with his family doctor if he develops new or worsening symptoms.   The patient was advised to call immediately if he has any concerning symptoms in the interval. The patient voices understanding of current disease status and treatment options and is in agreement with the current care plan. All questions were answered.  The patient knows to call the clinic with any problems, questions or concerns. We can certainly see the patient much sooner if necessary    Orders Placed This Encounter  Procedures  . CT Chest W Contrast    Standing Status:   Future    Standing Expiration Date:   01/05/2021    Order Specific Question:   If indicated for the ordered procedure, I authorize the administration of contrast media per Radiology protocol    Answer:   Yes    Order Specific Question:   Preferred imaging location?    Answer:   [facility]    Order Specific Question:   Radiology Contrast Protocol - do NOT remove file path    Answer:   [email or web address]     [place], PA-C 01/06/20

## 2020-01-06 ENCOUNTER — Inpatient Hospital Stay: Payer: Managed Care, Other (non HMO)

## 2020-01-06 ENCOUNTER — Inpatient Hospital Stay (HOSPITAL_BASED_OUTPATIENT_CLINIC_OR_DEPARTMENT_OTHER): Payer: Managed Care, Other (non HMO) | Admitting: Physician Assistant

## 2020-01-06 ENCOUNTER — Other Ambulatory Visit: Payer: Self-pay

## 2020-01-06 ENCOUNTER — Ambulatory Visit
Admission: RE | Admit: 2020-01-06 | Discharge: 2020-01-06 | Disposition: A | Discharge status: Home / Self Care | Payer: Managed Care, Other (non HMO) | Source: Ambulatory Visit | Attending: Radiation Oncology | Admitting: Radiation Oncology

## 2020-01-06 VITALS — BP 117/82 | HR 99 | Temp 97.9°F | Resp 18 | Ht 68.0 in | Wt 172.7 lb

## 2020-01-06 DIAGNOSIS — C3491 Malignant neoplasm of unspecified part of right bronchus or lung: Secondary | ICD-10-CM | POA: Diagnosis not present

## 2020-01-06 DIAGNOSIS — Z5111 Encounter for antineoplastic chemotherapy: Secondary | ICD-10-CM | POA: Diagnosis not present

## 2020-01-06 DIAGNOSIS — Z95828 Presence of other vascular implants and grafts: Secondary | ICD-10-CM

## 2020-01-06 DIAGNOSIS — C7931 Secondary malignant neoplasm of brain: Secondary | ICD-10-CM | POA: Diagnosis not present

## 2020-01-06 LAB — CBC WITH DIFFERENTIAL (CANCER CENTER ONLY)
Abs Immature Granulocytes: 0.38 10*3/uL — ABNORMAL HIGH (ref 0.00–0.07)
Basophils Absolute: 0.1 10*3/uL (ref 0.0–0.1)
Basophils Relative: 1 %
Eosinophils Absolute: 0 10*3/uL (ref 0.0–0.5)
Eosinophils Relative: 0 %
HCT: 32.5 % — ABNORMAL LOW (ref 39.0–52.0)
Hemoglobin: 10.8 g/dL — ABNORMAL LOW (ref 13.0–17.0)
Immature Granulocytes: 4 %
Lymphocytes Relative: 6 %
Lymphs Abs: 0.5 10*3/uL — ABNORMAL LOW (ref 0.7–4.0)
MCH: 31 pg (ref 26.0–34.0)
MCHC: 33.2 g/dL (ref 30.0–36.0)
MCV: 93.4 fL (ref 80.0–100.0)
Monocytes Absolute: 0.8 10*3/uL (ref 0.1–1.0)
Monocytes Relative: 9 %
Neutro Abs: 7.8 10*3/uL — ABNORMAL HIGH (ref 1.7–7.7)
Neutrophils Relative %: 80 %
Platelet Count: 284 10*3/uL (ref 150–400)
RBC: 3.48 MIL/uL — ABNORMAL LOW (ref 4.22–5.81)
RDW: 18.8 % — ABNORMAL HIGH (ref 11.5–15.5)
WBC Count: 9.7 10*3/uL (ref 4.0–10.5)
nRBC: 0 % (ref 0.0–0.2)

## 2020-01-06 LAB — CMP (CANCER CENTER ONLY)
ALT: 22 U/L (ref 0–44)
AST: 16 U/L (ref 15–41)
Albumin: 3.4 g/dL — ABNORMAL LOW (ref 3.5–5.0)
Alkaline Phosphatase: 52 U/L (ref 38–126)
Anion gap: 8 (ref 5–15)
BUN: 8 mg/dL (ref 6–20)
CO2: 26 mmol/L (ref 22–32)
Calcium: 9.4 mg/dL (ref 8.9–10.3)
Chloride: 105 mmol/L (ref 98–111)
Creatinine: 0.67 mg/dL (ref 0.61–1.24)
GFR, Est AFR Am: >60 mL/min (ref >60)
GFR, Estimated: >60 mL/min (ref >60)
Glucose, Bld: 103 mg/dL — ABNORMAL HIGH (ref 70–99)
Potassium: 3.6 mmol/L (ref 3.5–5.1)
Sodium: 139 mmol/L (ref 135–145)
Total Bilirubin: 0.5 mg/dL (ref 0.3–1.2)
Total Protein: 6.8 g/dL (ref 6.5–8.1)

## 2020-01-06 MED ORDER — FAMOTIDINE IN NACL 20-0.9 MG/50ML-% IV SOLN
INTRAVENOUS | Status: AC
  Filled 2020-01-06: qty 50

## 2020-01-06 MED ORDER — SODIUM CHLORIDE 0.9 % IV SOLN
45.0000 mg/m2 | Freq: Once | INTRAVENOUS | Status: AC
  Administered 2020-01-06: 90 mg via INTRAVENOUS
  Filled 2020-01-06: qty 15

## 2020-01-06 MED ORDER — SODIUM CHLORIDE 0.9 % IV SOLN
250.0000 mg | Freq: Once | INTRAVENOUS | Status: AC
  Administered 2020-01-06: 250 mg via INTRAVENOUS
  Filled 2020-01-06: qty 25

## 2020-01-06 MED ORDER — HEPARIN SOD (PORK) LOCK FLUSH 100 UNIT/ML IV SOLN
500.0000 [IU] | Freq: Once | INTRAVENOUS | Status: AC | PRN
  Administered 2020-01-06: 500 [IU]
  Filled 2020-01-06: qty 5

## 2020-01-06 MED ORDER — PALONOSETRON HCL INJECTION 0.25 MG/5ML
INTRAVENOUS | Status: AC
  Filled 2020-01-06: qty 5

## 2020-01-06 MED ORDER — FAMOTIDINE IN NACL 20-0.9 MG/50ML-% IV SOLN
20.0000 mg | Freq: Once | INTRAVENOUS | Status: AC
  Administered 2020-01-06: 20 mg via INTRAVENOUS

## 2020-01-06 MED ORDER — SODIUM CHLORIDE 0.9% FLUSH
10.0000 mL | Freq: Once | INTRAVENOUS | Status: AC
  Administered 2020-01-06: 10 mL
  Filled 2020-01-06: qty 10

## 2020-01-06 MED ORDER — SODIUM CHLORIDE 0.9 % IV SOLN
20.0000 mg | Freq: Once | INTRAVENOUS | Status: AC
  Administered 2020-01-06: 20 mg via INTRAVENOUS
  Filled 2020-01-06: qty 20

## 2020-01-06 MED ORDER — DIPHENHYDRAMINE HCL 50 MG/ML IJ SOLN
50.0000 mg | Freq: Once | INTRAMUSCULAR | Status: AC
  Administered 2020-01-06: 50 mg via INTRAVENOUS

## 2020-01-06 MED ORDER — PALONOSETRON HCL INJECTION 0.25 MG/5ML
0.2500 mg | Freq: Once | INTRAVENOUS | Status: AC
  Administered 2020-01-06: 0.25 mg via INTRAVENOUS

## 2020-01-06 MED ORDER — SODIUM CHLORIDE 0.9 % IV SOLN
Freq: Once | INTRAVENOUS | Status: AC
  Filled 2020-01-06: qty 250

## 2020-01-06 MED ORDER — DIPHENHYDRAMINE HCL 50 MG/ML IJ SOLN
INTRAMUSCULAR | Status: AC
  Filled 2020-01-06: qty 1

## 2020-01-06 MED ORDER — SODIUM CHLORIDE 0.9% FLUSH
10.0000 mL | INTRAVENOUS | Status: DC | PRN
  Administered 2020-01-06: 10 mL
  Filled 2020-01-06: qty 10

## 2020-01-06 NOTE — Patient Instructions (Signed)
[place] Discharge Instructions for Patients Receiving Chemotherapy  Today you received the following chemotherapy agents: Taxol/Carbo.  To help prevent nausea and vomiting after your treatment, we encourage you to take your nausea medication as directed.   If you develop nausea and vomiting that is not controlled by your nausea medication, call the clinic.   BELOW ARE SYMPTOMS THAT SHOULD BE REPORTED IMMEDIATELY:  *FEVER GREATER THAN 100.5 F  *CHILLS WITH OR WITHOUT FEVER  NAUSEA AND VOMITING THAT IS NOT CONTROLLED WITH YOUR NAUSEA MEDICATION  *UNUSUAL SHORTNESS OF BREATH  *UNUSUAL BRUISING OR BLEEDING  TENDERNESS IN MOUTH AND THROAT WITH OR WITHOUT PRESENCE OF ULCERS  *URINARY PROBLEMS  *BOWEL PROBLEMS  UNUSUAL RASH Items with * indicate a potential emergency and should be followed up as soon as possible.  Feel free to call the clinic should you have any questions or concerns. The clinic phone number is [phone].  Please show the [facility] at check-in to the Emergency Department and triage nurse.

## 2020-01-07 ENCOUNTER — Other Ambulatory Visit: Payer: Self-pay

## 2020-01-07 ENCOUNTER — Ambulatory Visit
Admission: RE | Admit: 2020-01-07 | Discharge: 2020-01-07 | Disposition: A | Discharge status: Home / Self Care | Payer: Managed Care, Other (non HMO) | Source: Ambulatory Visit | Attending: Radiation Oncology | Admitting: Radiation Oncology

## 2020-01-07 ENCOUNTER — Telehealth: Payer: Self-pay | Admitting: Physician Assistant

## 2020-01-07 DIAGNOSIS — C7931 Secondary malignant neoplasm of brain: Secondary | ICD-10-CM | POA: Diagnosis not present

## 2020-01-07 NOTE — Telephone Encounter (Signed)
Scheduled per los. Called and spoke with patient. Confirmed appt 

## 2020-01-08 ENCOUNTER — Ambulatory Visit
Admission: RE | Admit: 2020-01-08 | Discharge: 2020-01-08 | Disposition: A | Discharge status: Home / Self Care | Payer: Managed Care, Other (non HMO) | Source: Ambulatory Visit | Attending: Radiation Oncology | Admitting: Radiation Oncology

## 2020-01-08 DIAGNOSIS — C7931 Secondary malignant neoplasm of brain: Secondary | ICD-10-CM | POA: Diagnosis not present

## 2020-01-09 ENCOUNTER — Other Ambulatory Visit: Payer: Self-pay

## 2020-01-09 ENCOUNTER — Ambulatory Visit
Admission: RE | Admit: 2020-01-09 | Discharge: 2020-01-09 | Disposition: A | Discharge status: Home / Self Care | Payer: Managed Care, Other (non HMO) | Source: Ambulatory Visit | Attending: Radiation Oncology | Admitting: Radiation Oncology

## 2020-01-09 ENCOUNTER — Encounter: Payer: Self-pay | Admitting: Radiation Oncology

## 2020-01-09 DIAGNOSIS — C7931 Secondary malignant neoplasm of brain: Secondary | ICD-10-CM | POA: Diagnosis not present

## 2020-01-10 ENCOUNTER — Ambulatory Visit: Payer: Managed Care, Other (non HMO)

## 2020-01-13 ENCOUNTER — Ambulatory Visit: Payer: Managed Care, Other (non HMO)

## 2020-01-14 ENCOUNTER — Ambulatory Visit: Payer: Managed Care, Other (non HMO)

## 2020-01-21 NOTE — Progress Notes (Signed)
  Radiation Oncology         [phone] ________________________________  Name: Joseph Atkins MRN: [id number]  Date: 01/09/2020  DOB: [birth year]  End of Treatment Note  Diagnosis:  Lung cancer     Indication for treatment::  curative       Radiation treatment dates:   11/25/19 - 01/09/20  Site/dose:   The patient was treated to the disease within the right lung initially to a dose of 60 Gy using a 4 field, 3-D conformal technique. The patient then received a cone down boost treatment for an additional 6 Gy. This yielded a final total dose of 66 Gy.    Narrative: The patient tolerated radiation treatment relatively well.    Plan: The patient has completed radiation treatment. The patient will return to radiation oncology clinic for routine followup in one month. I advised the patient to call or return sooner if they have any questions or concerns related to their recovery or treatment. ________________________________  Jodelle Gross, M.D., Ph.D.

## 2020-01-22 ENCOUNTER — Telehealth: Payer: Self-pay | Admitting: *Deleted

## 2020-01-22 NOTE — Telephone Encounter (Signed)
Spoke with the patient's wife to let her know that the PA would like for him to take his decadron 2 mg daily.  She verbalized understanding.  He is scheduled to see Dr. Mickeal Skinner on Friday 01/24/2020.  Will continue to follow as necessary.  Gloriajean Dell. [name], BSN

## 2020-01-24 ENCOUNTER — Inpatient Hospital Stay: Payer: Managed Care, Other (non HMO) | Attending: Physician Assistant | Admitting: Internal Medicine

## 2020-01-24 ENCOUNTER — Other Ambulatory Visit: Payer: Self-pay

## 2020-01-24 VITALS — BP 123/90 | HR 128 | Temp 97.8°F | Resp 20 | Ht 68.0 in | Wt 164.9 lb

## 2020-01-24 DIAGNOSIS — R42 Dizziness and giddiness: Secondary | ICD-10-CM | POA: Insufficient documentation

## 2020-01-24 DIAGNOSIS — F329 Major depressive disorder, single episode, unspecified: Secondary | ICD-10-CM | POA: Insufficient documentation

## 2020-01-24 DIAGNOSIS — Z5111 Encounter for antineoplastic chemotherapy: Secondary | ICD-10-CM | POA: Insufficient documentation

## 2020-01-24 DIAGNOSIS — R112 Nausea with vomiting, unspecified: Secondary | ICD-10-CM | POA: Insufficient documentation

## 2020-01-24 DIAGNOSIS — R519 Headache, unspecified: Secondary | ICD-10-CM | POA: Diagnosis not present

## 2020-01-24 DIAGNOSIS — F5101 Primary insomnia: Secondary | ICD-10-CM | POA: Diagnosis not present

## 2020-01-24 DIAGNOSIS — C7931 Secondary malignant neoplasm of brain: Secondary | ICD-10-CM | POA: Diagnosis not present

## 2020-01-24 DIAGNOSIS — Z923 Personal history of irradiation: Secondary | ICD-10-CM | POA: Diagnosis not present

## 2020-01-24 DIAGNOSIS — F419 Anxiety disorder, unspecified: Secondary | ICD-10-CM | POA: Insufficient documentation

## 2020-01-24 DIAGNOSIS — R110 Nausea: Secondary | ICD-10-CM | POA: Diagnosis not present

## 2020-01-24 DIAGNOSIS — F17210 Nicotine dependence, cigarettes, uncomplicated: Secondary | ICD-10-CM | POA: Insufficient documentation

## 2020-01-24 DIAGNOSIS — C3431 Malignant neoplasm of lower lobe, right bronchus or lung: Secondary | ICD-10-CM | POA: Insufficient documentation

## 2020-01-24 MED ORDER — DEXAMETHASONE 2 MG PO TABS: 4.0000 mg | ORAL_TABLET | Freq: Every day | ORAL | 0 refills | Status: DC

## 2020-01-24 NOTE — Progress Notes (Signed)
[facility] at [place], [place] [address] [phone]   New Patient Evaluation  Date of Service: 01/24/20 Patient Name: Joseph Atkins Patient MRN: [id number] Patient DOB: 10/10/1960 Provider: Ventura Sellers, MD  Identifying Statement:  Joseph Atkins is a 59 y.o. male with Brain metastasis ([facility]) [C79.31] who presents for initial consultation and evaluation regarding cancer associated neurologic deficits.    Referring Provider: Kyung Rudd, MD [address] [place],  [place] [address]  Primary Cancer:  Oncologic History: Oncology History  Adenocarcinoma of right lung, stage 4 ([place] [facility])  11/14/2019 Initial Diagnosis   Adenocarcinoma of right lung, stage 4 ([facility])   11/25/2019 -  Chemotherapy   The patient had palonosetron (ALOXI) injection 0.25 mg, 0.25 mg, Intravenous,  Once, 7 of 7 cycles Administration: 0.25 mg (11/25/2019), 0.25 mg (12/23/2019), 0.25 mg (12/31/2019), 0.25 mg (12/02/2019), 0.25 mg (01/06/2020), 0.25 mg (12/09/2019), 0.25 mg (12/16/2019) CARBOplatin (PARAPLATIN) 250 mg in sodium chloride 0.9 % 250 mL chemo infusion, 249.8 mg (100 % of original dose 249.8 mg), Intravenous,  Once, 7 of 7 cycles Dose modification: 249.8 mg (original dose 249.8 mg, Cycle 1) Administration: 250 mg (11/25/2019), 250 mg (12/23/2019), 250 mg (12/31/2019), 250 mg (12/02/2019), 250 mg (01/06/2020), 270 mg (12/09/2019), 250 mg (12/16/2019) PACLitaxel (TAXOL) 90 mg in sodium chloride 0.9 % 250 mL chemo infusion (</= 80mg /m2), 45 mg/m2 = 90 mg, Intravenous,  Once, 7 of 7 cycles Administration: 90 mg (11/25/2019), 90 mg (12/23/2019), 90 mg (12/31/2019), 90 mg (12/02/2019), 90 mg (01/06/2020), 90 mg (12/09/2019), 90 mg (12/16/2019)  for chemotherapy treatment.     11/14/19: Completes single frx SRS to 2.4cm cerebellar metastasis  History of Present Illness: The patient's records from the referring physician were obtained and reviewed and the patient interviewed to  confirm this HPI.  Joseph Atkins presents today to discuss recent recurrence of neurologic symptoms following radiosurgery in July 2021.  He describes poor balance, wide based walking, needing to hold on to family or objects to walk safely.  He also describes nausea and dizzy-headed feeling at times.  Overall he is functioning in an impaired and sluggish way.  Symptoms have become noticeable since decreasing decadron to less than 4mg  daily; currently he is dosing 2mg  daily.  He felt "quite good" immediately after radiation and during higher dose steroid therapy.  No complications from decadron that he can report.  Recently completed chemoradioatherapy induction for lung adenocarcinoma with Dr. Julien Nordmann.  Medications: Current Outpatient Medications on File Prior to Visit  Medication Sig Dispense Refill  . albuterol (PROVENTIL HFA;VENTOLIN HFA) 108 (90 Base) MCG/ACT inhaler Inhale 2 puffs into the lungs every 6 (six) hours as needed for wheezing or shortness of breath. 1 Inhaler 0  . ANORO ELLIPTA 62.5-25 MCG/INH AEPB 1 puff daily.    . cyclobenzaprine (FLEXERIL) 10 MG tablet Take 10 mg by mouth 2 (two) times daily as needed for muscle spasms. (Patient not taking: Reported on 12/16/2019)    . docusate sodium (COLACE) 100 MG capsule Take 100 mg by mouth daily.    . fluticasone furoate-vilanterol (BREO ELLIPTA) 100-25 MCG/INH AEPB Inhale 1 puff into the lungs daily. 30 each 3  . levothyroxine (SYNTHROID) 75 MCG tablet Take 75 mcg by mouth daily.    Marland Kitchen lidocaine (XYLOCAINE) 2 % solution Use as directed 15 mLs in the mouth or throat every 6 (six) hours as needed for mouth pain. Do not eat/drink within 60 minutes of taking  this medication 100 mL 0  . lidocaine-prilocaine (EMLA) cream Apply 1 application topically as needed. 30 g 0  . omeprazole (PRILOSEC) 40 MG capsule Take 1 capsule (40 mg total) by mouth daily. 90 capsule 4  . ondansetron (ZOFRAN) 4 MG tablet Take 4 mg by mouth every 8 (eight) hours as  needed.     Marland Kitchen oxyCODONE-acetaminophen (PERCOCET/ROXICET) 5-325 MG tablet Take 1 tablet by mouth every 4 (four) hours as needed for severe pain. (Patient not taking: Reported on 12/16/2019) 30 tablet 0  . polyethylene glycol powder (MIRALAX) powder Take 17 g by mouth daily. 255 g 11  . prochlorperazine (COMPAZINE) 10 MG tablet Take 1 tablet (10 mg total) by mouth every 6 (six) hours as needed. (Patient not taking: Reported on 12/16/2019) 30 tablet 2  . sucralfate (CARAFATE) 1 g tablet Take 1 tablet (1 g total) by mouth 4 (four) times daily. Dissolve each tablet in 15 cc water before use. 120 tablet 2  . TRULANCE 3 MG TABS Take 1 tablet by mouth daily.     No current facility-administered medications on file prior to visit.    Allergies:  Allergies  Allergen Reactions  . Penicillins Other (See Comments)    Childhood allergy.  Has patient had a PCN reaction causing immediate rash, facial/tongue/throat swelling, SOB or lightheadedness with hypotension: unknown Has patient had a PCN reaction causing severe rash involving mucus membranes or skin necrosis: unknown Has patient had a PCN reaction that required hospitalization: unknown Has patient had a PCN reaction occurring within the last 10 years: no If all of the above answers are "NO", then may proceed with Cephalosporin use.    Past Medical History:  Past Medical History:  Diagnosis Date  . Cancer ([facility])    lung cancer  . Diverticulosis   . GERD (gastroesophageal reflux disease)   . Hx of small bowel obstruction   . Hyperlipidemia   . Hypothyroidism   . IBS (irritable bowel syndrome)   . Substance abuse ([facility])    Alcoholic, Drug addition  . Thyroid disease    Past Surgical History:  Past Surgical History:  Procedure Laterality Date  . arm surgery Right   . BRONCHIAL BRUSHINGS  10/24/2019   Procedure: BRONCHIAL BRUSHINGS;  Surgeon: Collene Gobble, MD;  [facility];  Service: Cardiopulmonary;;  right lower lobe   .  BRONCHIAL BRUSHINGS  11/05/2019   Procedure: BRONCHIAL BRUSHINGS;  Surgeon: Collene Gobble, MD;  Location: [facility] ENDOSCOPY;  Service: Pulmonary;;  . BRONCHIAL NEEDLE ASPIRATION BIOPSY  10/24/2019   Procedure: BRONCHIAL NEEDLE ASPIRATION BIOPSIES;  Surgeon: Collene Gobble, MD;  Location: [facility];  Service: Cardiopulmonary;;  . BRONCHIAL NEEDLE ASPIRATION BIOPSY  11/05/2019   Procedure: BRONCHIAL NEEDLE ASPIRATION BIOPSIES;  Surgeon: Collene Gobble, MD;  Location: [facility] ENDOSCOPY;  Service: Pulmonary;;  . ENDOBRONCHIAL ULTRASOUND N/A 10/24/2019   Procedure: ENDOBRONCHIAL ULTRASOUND;  Surgeon: Collene Gobble, MD;  Location: [facility];  Service: Cardiopulmonary;  Laterality: N/A;  . FINGER SURGERY Right    Middle  . IR IMAGING GUIDED PORT INSERTION  11/19/2019  . VIDEO BRONCHOSCOPY N/A 10/24/2019   Procedure: VIDEO BRONCHOSCOPY WITHOUT FLUORO;  Surgeon: Collene Gobble, MD;  Location: [facility] ENDOSCOPY;  Service: Cardiopulmonary;  Laterality: N/A;  . VIDEO BRONCHOSCOPY WITH ENDOBRONCHIAL NAVIGATION N/A 11/05/2019   Procedure: VIDEO BRONCHOSCOPY WITH ENDOBRONCHIAL NAVIGATION;  Surgeon: Collene Gobble, MD;  Location: [facility] ENDOSCOPY;  Service: Pulmonary;  Laterality: N/A;   Social History:  Social History  Socioeconomic History  . Marital status: Married    Spouse name: Not on file  . Number of children: 2  . Years of education: Not on file  . Highest education level: Not on file  Occupational History  . Occupation: Buyer, retail: [employer]  Tobacco Use  . Smoking status: Current Every Day Smoker    Packs/day: 1.50    Types: Cigarettes  . Smokeless tobacco: Former Systems developer    Types: Secondary school teacher  . Vaping Use: Never used  Substance and Sexual Activity  . Alcohol use: No    Comment: Alcoholic clean for 6 years  . Drug use: No    Comment: Recovering Drug Addict-clean for 6 years  . Sexual activity: Not on file  Other Topics Concern  . Not on file  Social History Narrative   . Not on file   Social Determinants of Health   Financial Resource Strain: Medium Risk  . Difficulty of Paying Living Expenses: Somewhat hard  Food Insecurity: No Food Insecurity  . Worried About Charity fundraiser in the Last Year: Never true  . Ran Out of Food in the Last Year: Never true  Transportation Needs: No Transportation Needs  . Lack of Transportation (Medical): No  . Lack of Transportation (Non-Medical): No  Physical Activity: Insufficiently Active  . Days of Exercise per Week: 5 days  . Minutes of Exercise per Session: 20 min  Stress: Stress Concern Present  . Feeling of Stress : Very much  Social Connections: Moderately Isolated  . Frequency of Communication with Friends and Family: More than three times a week  . Frequency of Social Gatherings with Friends and Family: More than three times a week  . Attends Religious Services: Never  . Active Member of Clubs or Organizations: No  . Attends Archivist Meetings: Never  . Marital Status: Married  Human resources officer Violence: Not At Risk  . Fear of Current or Ex-Partner: No  . Emotionally Abused: No  . Physically Abused: No  . Sexually Abused: No   Family History:  Family History  Problem Relation Age of Onset  . Epilepsy Mother   . Heart disease Mother   . Heart disease Brother   . Lung cancer Paternal Uncle     Review of Systems: Constitutional: Doesn't report fevers, chills or abnormal weight loss Eyes: Doesn't report blurriness of vision Ears, nose, mouth, throat, and face: Doesn't report sore throat Respiratory: Doesn't report cough, dyspnea or wheezes Cardiovascular: Doesn't report palpitation, chest discomfort  Gastrointestinal:  Doesn't report nausea, constipation, diarrhea GU: Doesn't report incontinence Skin: Doesn't report skin rashes Neurological: Per HPI Musculoskeletal: Doesn't report joint pain Behavioral/Psych: Doesn't report anxiety  Physical Exam: Vitals:   01/24/20 1132   BP: 123/90  Pulse: (!) 128  Resp: 20  Temp: 97.8 F (36.6 C)  SpO2: 100%   KPS: 70. General: Alert, cooperative, pleasant, in no acute distress Head: Normal EENT: No conjunctival injection or scleral icterus.  Lungs: Resp effort normal Cardiac: Regular rate Abdomen: Non-distended abdomen Skin: No rashes cyanosis or petechiae. Extremities: No clubbing or edema  Neurologic Exam: Mental Status: Awake, alert, attentive to examiner. Oriented to self and environment. Language is fluent with intact comprehension.  Cranial Nerves: Visual acuity is grossly normal. Visual fields are full. Extra-ocular movements intact. No ptosis. Face is symmetric Motor: Tone and bulk are normal. Power is full in both arms and legs. Reflexes are symmetric, no pathologic reflexes present.  Dysmetria  L>R Sensory: Intact to light touch Gait: Dystaxic, wide based   Labs: I have reviewed the data as listed    Component Value Date/Time   NA 139 01/06/2020 0837   NA 141 03/08/2017 1707   K 3.6 01/06/2020 0837   CL 105 01/06/2020 0837   CO2 26 01/06/2020 0837   GLUCOSE 103 (H) 01/06/2020 0837   BUN 8 01/06/2020 0837   BUN 12 03/08/2017 1707   CREATININE 0.67 01/06/2020 0837   CALCIUM 9.4 01/06/2020 0837   PROT 6.8 01/06/2020 0837   PROT 7.1 03/08/2017 1707   ALBUMIN 3.4 (L) 01/06/2020 0837   ALBUMIN 4.5 03/08/2017 1707   AST 16 01/06/2020 0837   ALT 22 01/06/2020 0837   ALKPHOS 52 01/06/2020 0837   BILITOT 0.5 01/06/2020 0837   GFRNONAA >60 01/06/2020 0837   GFRAA >60 01/06/2020 0837   Lab Results  Component Value Date   WBC 9.7 01/06/2020   NEUTROABS 7.8 (H) 01/06/2020   HGB 10.8 (L) 01/06/2020   HCT 32.5 (L) 01/06/2020   MCV 93.4 01/06/2020   PLT 284 01/06/2020    Imaging:  CLINICAL DATA:  Brain/CNS neoplasm, staging.  EXAM: MRI HEAD WITHOUT AND WITH CONTRAST  TECHNIQUE: Multiplanar, multiecho pulse sequences of the brain and surrounding structures were obtained without and  with intravenous contrast.  CONTRAST:  38mL MULTIHANCE GADOBENATE DIMEGLUMINE 529 MG/ML IV SOLN  COMPARISON:  10/18/2019 MRI head.  FINDINGS: Brain:  Increased size of peripherally enhancing, centrally necrotic 2.4 x 2.3 cm left cerebellar mass (11:36) with associated peripheral restricted diffusion. Perilesional edema involving the left greater than right cerebellum with partial effacement of the fourth ventricle is grossly unchanged.  New punctate high left frontal enhancement involving the precentral gyrus (11:136).  Scattered T2/FLAIR supratentorial white matter foci are nonspecific however commonly associated with chronic microvascular ischemic changes.  No acute infarct or intracranial hemorrhage. No midline shift, ventriculomegaly or extra-axial fluid collection.  Vascular: Normal flow voids. Right frontal developmental venous anomaly.  Skull and upper cervical spine: Normal marrow signal.  Sinuses/Orbits: Normal orbits. Right maxillary sinus mucous retention cyst. Trace bilateral mastoid effusions.  Other: None.  IMPRESSION: Increased size of 2.4 cm left cerebellar metastasis.  New punctate left precentral gyrus enhancement may be vascular in origin. Continued attention on follow-up.  Mild chronic microvascular ischemic changes.   Electronically Signed   By: Primitivo Gauze M.D.   On: 11/07/2019 17:01   Assessment/Plan Brain metastasis ([place]) [C79.31]  Joseph Atkins presents today with clinical and radiographic syndrome localizing to the left cerebellar hemisphere, etiology c/w radio-inflammatory process.  We suspect his corticosteroids were drawn dawn prior to resolution of subacute inflammatory period.    Recommended dosing 4mg  BID x3 days, then 4mg  daily x7 days, then 3mg  daily x7 days, then 2mg  daily x7 days, then 1mg  daily x7 days, then stopping.  We ask that Joseph Atkins return to clinic in 1 months following next  brain MRI, or sooner as needed.  We spent twenty additional minutes teaching regarding the natural history, biology, and historical experience in the treatment of neurologic complications of cancer.   We appreciate the opportunity to participate in the care of Joseph Atkins.   All questions were answered. The patient knows to call the clinic with any problems, questions or concerns. No barriers to learning were detected.  The total time spent in the encounter was 40 minutes and more than 50% was on counseling and review of test results  Ventura Sellers, MD Medical Director of Neuro-Oncology [facility] at [place] 01/24/20 2:49 PM

## 2020-01-27 ENCOUNTER — Telehealth: Payer: Self-pay | Admitting: Medical Oncology

## 2020-01-27 ENCOUNTER — Encounter: Payer: Self-pay | Admitting: Medical Oncology

## 2020-01-27 ENCOUNTER — Telehealth: Payer: Self-pay | Admitting: Internal Medicine

## 2020-01-27 NOTE — Telephone Encounter (Signed)
Does pt still need CT in October 6 ? ( -had super CT on  July 11). I told pt to call central scheduling to schedule his CT scan.

## 2020-01-27 NOTE — Telephone Encounter (Signed)
Scheduled per 10/1 los. Unable to reach pt. Left voicemail with appt times and dates.

## 2020-01-29 ENCOUNTER — Other Ambulatory Visit: Payer: Self-pay | Admitting: Radiation Therapy

## 2020-01-30 ENCOUNTER — Telehealth: Payer: Self-pay | Admitting: Medical Oncology

## 2020-01-30 ENCOUNTER — Other Ambulatory Visit: Payer: Self-pay | Admitting: Radiation Therapy

## 2020-01-30 DIAGNOSIS — C7931 Secondary malignant neoplasm of brain: Secondary | ICD-10-CM

## 2020-01-30 DIAGNOSIS — C7949 Secondary malignant neoplasm of other parts of nervous system: Secondary | ICD-10-CM

## 2020-01-30 NOTE — Progress Notes (Signed)
Orders placed for port access the day of brain MRI at [facility].   Mont Dutton R.T.(R)(T) Radiation Special Procedures Navigator

## 2020-01-30 NOTE — Telephone Encounter (Signed)
Instructed pt to call CS to schedule to get Scan scheduled for today or tomorrow.

## 2020-01-31 ENCOUNTER — Encounter (HOSPITAL_COMMUNITY): Payer: Self-pay

## 2020-01-31 ENCOUNTER — Ambulatory Visit (HOSPITAL_COMMUNITY)
Admission: RE | Admit: 2020-01-31 | Discharge: 2020-01-31 | Disposition: A | Discharge status: Home / Self Care | Payer: Managed Care, Other (non HMO) | Source: Ambulatory Visit | Attending: Physician Assistant | Admitting: Physician Assistant

## 2020-01-31 ENCOUNTER — Other Ambulatory Visit: Payer: Self-pay

## 2020-01-31 DIAGNOSIS — C3491 Malignant neoplasm of unspecified part of right bronchus or lung: Secondary | ICD-10-CM | POA: Insufficient documentation

## 2020-01-31 IMAGING — CT CT CHEST W/ CM
2 of 4 series · 15 of 36 positions shown, 18 images · IV contrast (omnipaque)
Comparison: CT [DATE], PET-CT [54]

CLINICAL DATA: Non-small cell lung cancer. Assess treatment
response.

EXAM:
CT CHEST WITH CONTRAST
TECHNIQUE: Multidetector CT imaging of the chest was performed during
intravenous contrast administration.
CONTRAST:  75mL OMNIPAQUE IOHEXOL 300 MG/ML  SOLN

[Series 2: axial st · axial · 0.88mm/px · z∈[-238,+66]mm · 12 of 180 slices shown, 15 images]
[im 14/180  mediastinal]
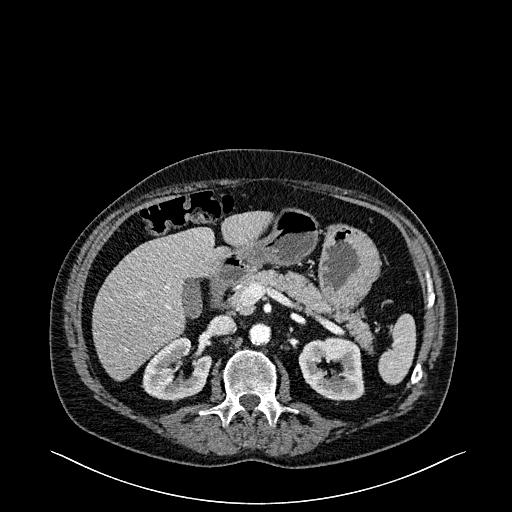
[im 14/180  lung]
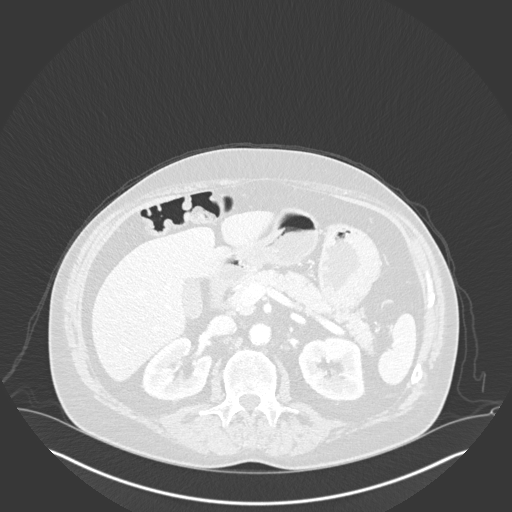
[im 28/180  lung]
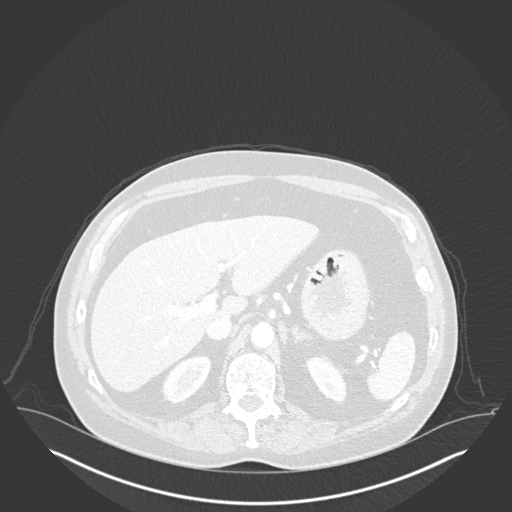
[im 42/180  lung]
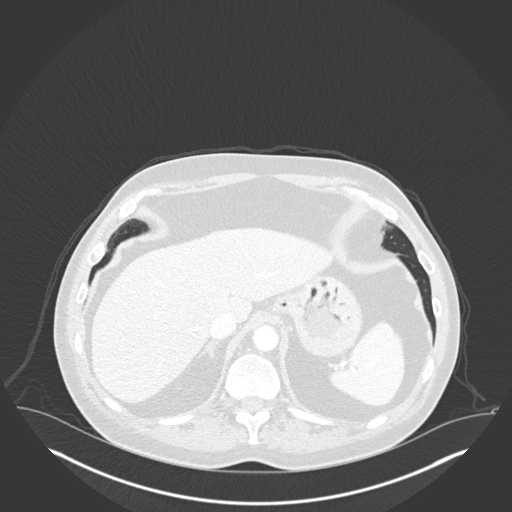
[im 56/180  lung]
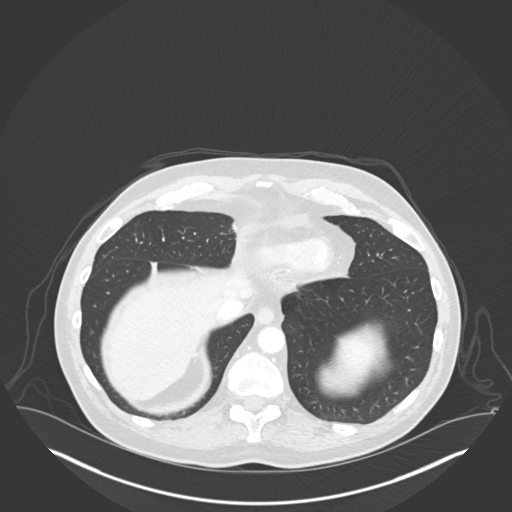
[im 69/180  mediastinal]
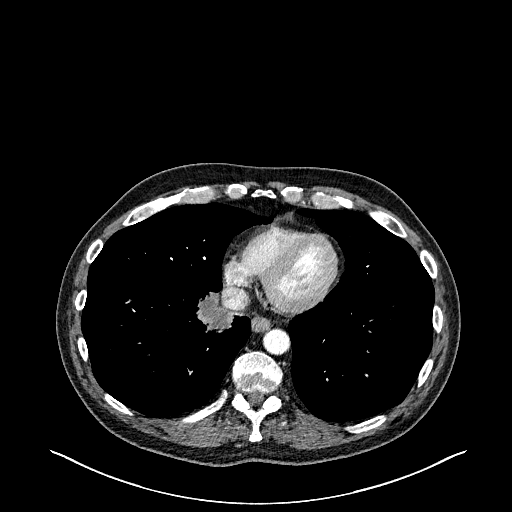
[im 69/180  lung]
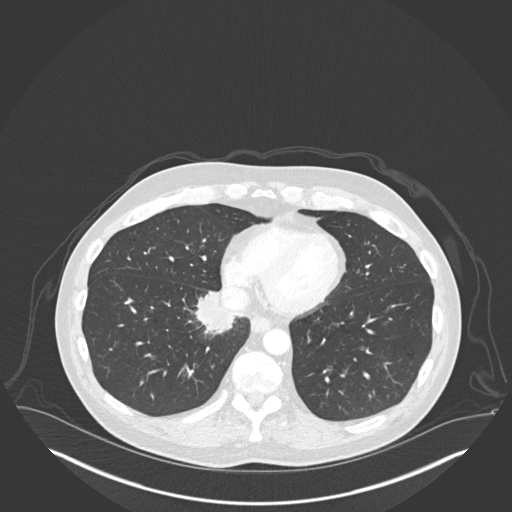
[im 83/180  lung]
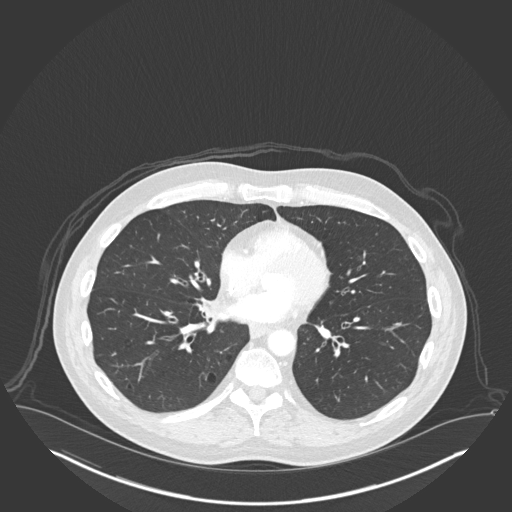
[im 97/180  lung]
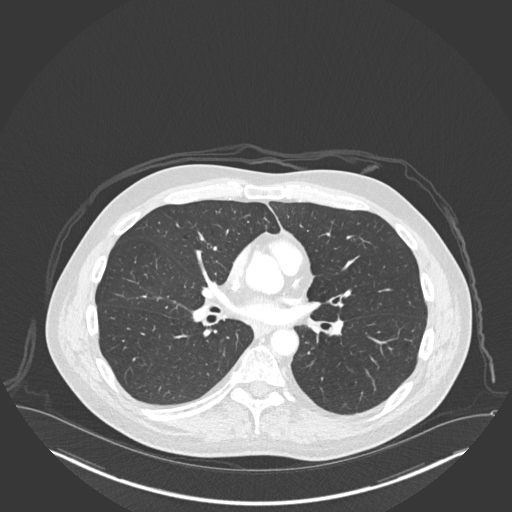
[im 111/180  lung]
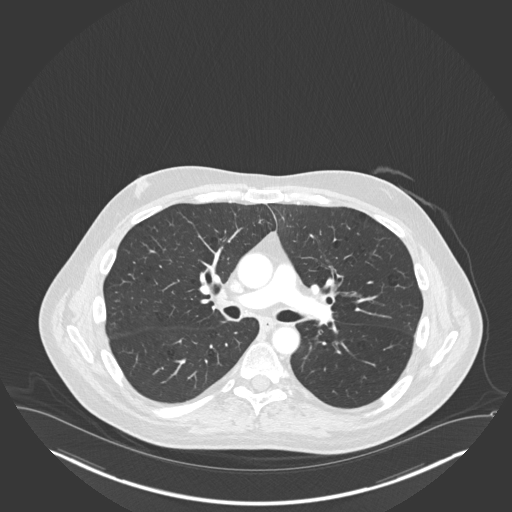
[im 124/180  mediastinal]
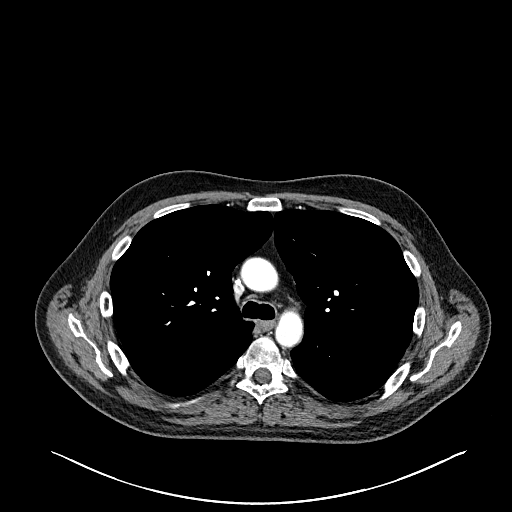
[im 124/180  lung]
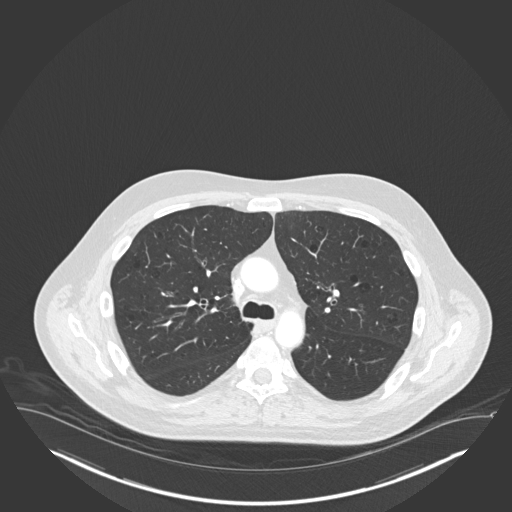
[im 138/180  lung]
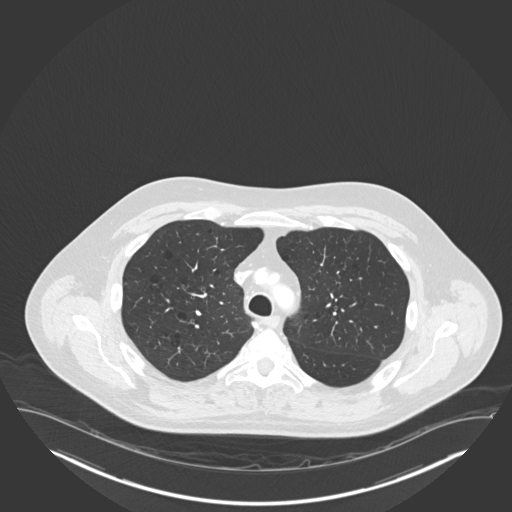
[im 152/180  lung]
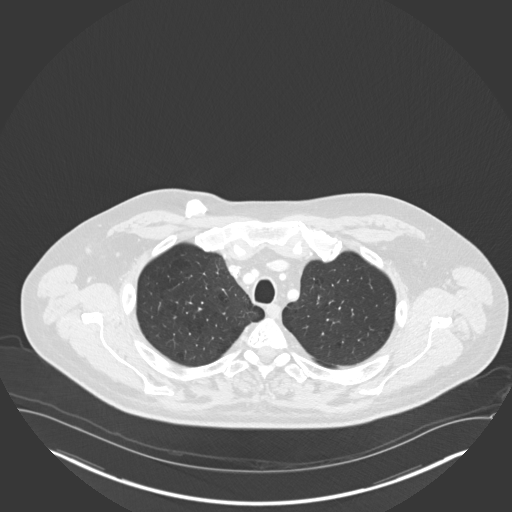
[im 166/180  lung]
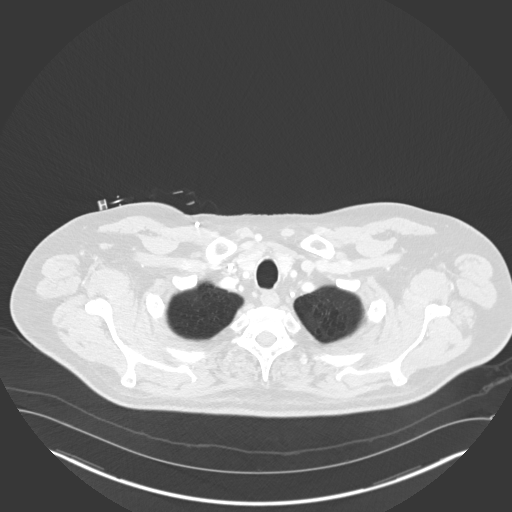

[Series 5: coronal · coronal · 0.82mm/px · 3 of 122 slices shown]
[im 25/122  lung]
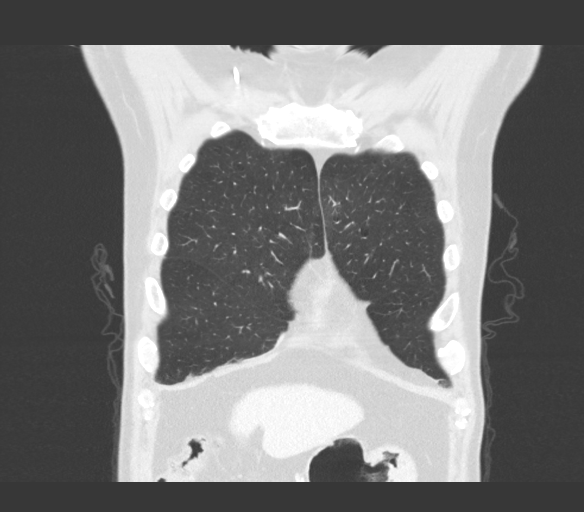
[im 49/122  lung]
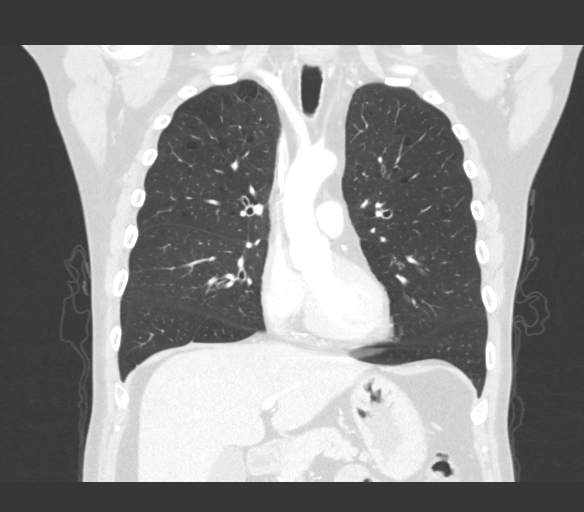
[im 73/122  lung]
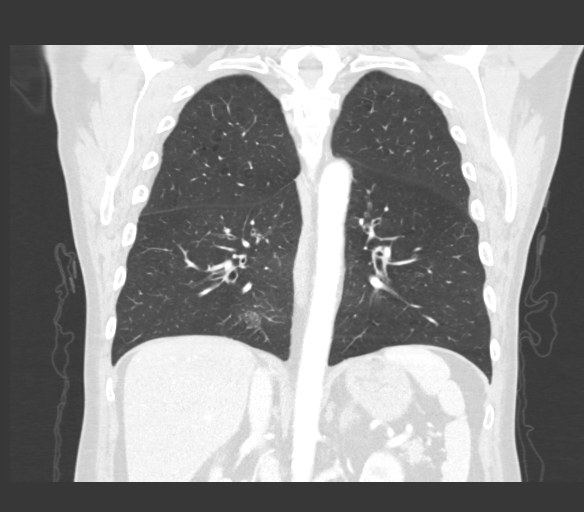

[15 of 36 positions shown; findings below may reference images not displayed]

FINDINGS: Cardiovascular: No significant vascular findings. Normal heart size.
No pericardial effusion.

Mediastinum/Nodes: Port in the anterior chest wall with tip in
distal SVC. No axillary or supraclavicular adenopathy. No
mediastinal or hilar adenopathy. No pericardial fluid. Esophagus
normal.

Lungs/Pleura: Interval decrease in volume of the RIGHT infrahilar
mass which measures 3.3 x 2.4 cm in axial dimension compared to
x 4.3 cm on CT [DATE].

Upper Abdomen: Limited view of the liver, kidneys, pancreas are
unremarkable. Normal adrenal glands.

Low-density lesion in the LEFT hepatic lobe measuring 13 mm is
unchanged and was not hypermetabolic on comparison PET-CT scan.
Additionally lesion not changed from CT [DATE].

Musculoskeletal: No acute osseous abnormality.
IMPRESSION: 1. Interval decrease in volume of RIGHT infrahilar mass.
2. No evidence of metastatic mediastinal lymphadenopathy.
3. No new or progressive disease.
4. Benign LEFT hepatic lobe hypodensity.

## 2020-01-31 MED ORDER — HEPARIN SOD (PORK) LOCK FLUSH 100 UNIT/ML IV SOLN
INTRAVENOUS | Status: AC
  Filled 2020-01-31: qty 5

## 2020-01-31 MED ORDER — HEPARIN SOD (PORK) LOCK FLUSH 100 UNIT/ML IV SOLN
500.0000 [IU] | Freq: Once | INTRAVENOUS | Status: AC
  Administered 2020-01-31: 500 [IU] via INTRAVENOUS

## 2020-01-31 MED ORDER — IOHEXOL 300 MG/ML  SOLN
75.0000 mL | Freq: Once | INTRAMUSCULAR | Status: AC | PRN
  Administered 2020-01-31: 75 mL via INTRAVENOUS

## 2020-02-03 ENCOUNTER — Encounter: Payer: Self-pay | Admitting: Internal Medicine

## 2020-02-03 ENCOUNTER — Telehealth: Payer: Self-pay | Admitting: Radiation Oncology

## 2020-02-03 ENCOUNTER — Other Ambulatory Visit: Payer: Self-pay

## 2020-02-03 ENCOUNTER — Inpatient Hospital Stay (HOSPITAL_BASED_OUTPATIENT_CLINIC_OR_DEPARTMENT_OTHER): Payer: Managed Care, Other (non HMO) | Admitting: Internal Medicine

## 2020-02-03 VITALS — BP 126/83 | HR 92 | Temp 97.6°F | Resp 17 | Ht 68.0 in | Wt 171.6 lb

## 2020-02-03 DIAGNOSIS — C7931 Secondary malignant neoplasm of brain: Secondary | ICD-10-CM | POA: Diagnosis not present

## 2020-02-03 DIAGNOSIS — Z7189 Other specified counseling: Secondary | ICD-10-CM

## 2020-02-03 DIAGNOSIS — Z5112 Encounter for antineoplastic immunotherapy: Secondary | ICD-10-CM

## 2020-02-03 DIAGNOSIS — C3491 Malignant neoplasm of unspecified part of right bronchus or lung: Secondary | ICD-10-CM | POA: Diagnosis not present

## 2020-02-03 DIAGNOSIS — C3431 Malignant neoplasm of lower lobe, right bronchus or lung: Secondary | ICD-10-CM | POA: Diagnosis not present

## 2020-02-03 NOTE — Progress Notes (Signed)
START OFF PATHWAY REGIMEN - Non-Small Cell Lung   [id number]:Pembrolizumab 200 mg IV D1 q21 Days:   A cycle is every 21 days:     Pembrolizumab   **Always confirm dose/schedule in your pharmacy ordering system**  Patient Characteristics: Stage IV Metastatic, Nonsquamous, Initial Chemotherapy/Immunotherapy, PS = 0, 1, ALK Rearrangement Negative and ROS1 Rearrangement Negative and NTRK Gene Fusion?Negative and RET Gene Fusion?Negative and EGFR Mutation Negative, PD-L1 Expression Positive  1-49% (TPS) / Negative / Not Tested / Awaiting Test Results and Immunotherapy Candidate Therapeutic Status: Stage IV Metastatic Histology: Nonsquamous Cell ROS1 Rearrangement Status: Negative Other Mutations/Biomarkers: Yes Chemotherapy/Immunotherapy LOT: Initial Chemotherapy/Immunotherapy Molecular Targeted Therapy: Not Appropriate KRAS G12C Mutation Status: Negative MET Exon 14 Mutation Status: Negative RET Gene Fusion Status: Negative EGFR Mutation Status: Negative/Wild Type NTRK Gene Fusion Status: Negative PD-L1 Expression Status: Quantity Not Sufficient ALK Rearrangement Status: Negative BRAF V600E Mutation Status: Negative ECOG Performance Status: 1 Biomarker Assessment Status Confirmation: All Genomic Markers Negative, or Only MET+ or BRAF+ or KRAS G12C+ Immunotherapy Candidate Status: Candidate for Immunotherapy Intent of Therapy: Non-Curative / Palliative Intent, Discussed with Patient

## 2020-02-03 NOTE — Progress Notes (Signed)
[name] [phone]   [phone]  OFFICE PROGRESS NOTE  [name], NP [address] [name] [address] [place] [address]  DIAGNOSIS: Stage IV (T3, N0, M1C) non-small cell lung cancer, adenocarcinoma.The patient presented with a right lower lobe/infrahilar mass as well as a solitary brain metastasis in the left cerebellum. He was diagnosed in July 2021.  Molecular Biomarkers:  MSI-High DETECTED Pembrolizumab Atezolizumab, Avelumab, Cemiplimab, Dostarlimab, Durvalumab, Ipilimumab, Nivolumab  STK11Splice Site SNV 5.3% Everolimus, Temsirolimus Yes  [id number] 1.7% Binimetinib Yes  [id number] 0.4%  Niraparib, Olaparib, Rucaparib, Talazoparib, Tazemetostat Yes  PRIOR THERAPY:  1) SRS to the solitary brain metastasis under the care of Dr. Lisbeth Renshaw. Last treatment 11/14/19. 2) Weekly concurrent chemoradiation with carboplatin for an AUC of 2, paclitaxel 45 mg/m2.First dose expected on 11/25/2019. Status post 7 cycles, last dose was giving 01/06/2020 with partial response.   CURRENT THERAPY:  Immunotherapy with Keytruda 200 mg IV every 3 weeks.  First dose February 10, 2020 for a patient with MSI high  INTERVAL HISTORY: Joseph Atkins 59 y.o. male returns to the clinic today for follow-up visit accompanied by his wife.  The patient is feeling fine today with no concerning complaints except for vomiting earlier today.  He is currently on a tapered dose of Decadron because of the brain metastasis and vasogenic edema.  He denied having any current chest pain, shortness of breath, cough or hemoptysis.  He denied having any fever or chills.  He has no abdominal pain, diarrhea or constipation.  He has no recent weight loss or night sweats.  The patient tolerated the previous course of concurrent chemoradiation fairly well.  Is here today for evaluation with repeat CT scan of the chest for restaging of his disease.  MEDICAL  HISTORY: Past Medical History:  Diagnosis Date  . Cancer ([facility])    lung cancer  . Diverticulosis   . GERD (gastroesophageal reflux disease)   . Hx of small bowel obstruction   . Hyperlipidemia   . Hypothyroidism   . IBS (irritable bowel syndrome)   . Substance abuse ([facility])    Alcoholic, Drug addition  . Thyroid disease     ALLERGIES:  is allergic to penicillins.  MEDICATIONS:  Current Outpatient Medications  Medication Sig Dispense Refill  . albuterol (PROVENTIL HFA;VENTOLIN HFA) 108 (90 Base) MCG/ACT inhaler Inhale 2 puffs into the lungs every 6 (six) hours as needed for wheezing or shortness of breath. 1 Inhaler 0  . ANORO ELLIPTA 62.5-25 MCG/INH AEPB 1 puff daily.    . cyclobenzaprine (FLEXERIL) 10 MG tablet Take 10 mg by mouth 2 (two) times daily as needed for muscle spasms. (Patient not taking: Reported on 12/16/2019)    . dexamethasone (DECADRON) 2 MG tablet Take 2 tablets (4 mg total) by mouth daily. 60 tablet 0  . docusate sodium (COLACE) 100 MG capsule Take 100 mg by mouth daily.    . fluticasone furoate-vilanterol (BREO ELLIPTA) 100-25 MCG/INH AEPB Inhale 1 puff into the lungs daily. 30 each 3  . levothyroxine (SYNTHROID) 75 MCG tablet Take 75 mcg by mouth daily.    Marland Kitchen lidocaine (XYLOCAINE) 2 % solution Use as directed 15 mLs in the mouth or throat every 6 (six) hours as needed for mouth pain. Do not eat/drink within 60 minutes of taking this medication 100 mL 0  . lidocaine-prilocaine (EMLA) cream Apply 1 application topically as needed. 30 g 0  . omeprazole (PRILOSEC) 40 MG capsule Take  1 capsule (40 mg total) by mouth daily. 90 capsule 4  . ondansetron (ZOFRAN) 4 MG tablet Take 4 mg by mouth every 8 (eight) hours as needed.     Marland Kitchen oxyCODONE-acetaminophen (PERCOCET/ROXICET) 5-325 MG tablet Take 1 tablet by mouth every 4 (four) hours as needed for severe pain. (Patient not taking: Reported on 12/16/2019) 30 tablet 0  . polyethylene glycol powder (MIRALAX) powder Take 17 g by  mouth daily. 255 g 11  . prochlorperazine (COMPAZINE) 10 MG tablet Take 1 tablet (10 mg total) by mouth every 6 (six) hours as needed. (Patient not taking: Reported on 12/16/2019) 30 tablet 2  . sucralfate (CARAFATE) 1 g tablet Take 1 tablet (1 g total) by mouth 4 (four) times daily. Dissolve each tablet in 15 cc water before use. 120 tablet 2  . TRULANCE 3 MG TABS Take 1 tablet by mouth daily.     No current facility-administered medications for this visit.    SURGICAL HISTORY:  Past Surgical History:  Procedure Laterality Date  . arm surgery Right   . BRONCHIAL BRUSHINGS  10/24/2019   Procedure: BRONCHIAL BRUSHINGS;  Surgeon: Collene Gobble, MD;  Location: [facility] ENDOSCOPY;  Service: Cardiopulmonary;;  right lower lobe   . BRONCHIAL BRUSHINGS  11/05/2019   Procedure: BRONCHIAL BRUSHINGS;  Surgeon: Collene Gobble, MD;  Location: [facility] ENDOSCOPY;  Service: Pulmonary;;  . BRONCHIAL NEEDLE ASPIRATION BIOPSY  10/24/2019   Procedure: BRONCHIAL NEEDLE ASPIRATION BIOPSIES;  Surgeon: Collene Gobble, MD;  Location: [facility];  Service: Cardiopulmonary;;  . BRONCHIAL NEEDLE ASPIRATION BIOPSY  11/05/2019   Procedure: BRONCHIAL NEEDLE ASPIRATION BIOPSIES;  Surgeon: Collene Gobble, MD;  Location: [facility] ENDOSCOPY;  Service: Pulmonary;;  . ENDOBRONCHIAL ULTRASOUND N/A 10/24/2019   Procedure: ENDOBRONCHIAL ULTRASOUND;  Surgeon: Collene Gobble, MD;  Location: [facility];  Service: Cardiopulmonary;  Laterality: N/A;  . FINGER SURGERY Right    Middle  . IR IMAGING GUIDED PORT INSERTION  11/19/2019  . VIDEO BRONCHOSCOPY N/A 10/24/2019   Procedure: VIDEO BRONCHOSCOPY WITHOUT FLUORO;  Surgeon: Collene Gobble, MD;  Location: [facility] ENDOSCOPY;  Service: Cardiopulmonary;  Laterality: N/A;  . VIDEO BRONCHOSCOPY WITH ENDOBRONCHIAL NAVIGATION N/A 11/05/2019   Procedure: VIDEO BRONCHOSCOPY WITH ENDOBRONCHIAL NAVIGATION;  Surgeon: Collene Gobble, MD;  Location: [facility] ENDOSCOPY;  Service: Pulmonary;  Laterality: N/A;    REVIEW OF  SYSTEMS:  Constitutional: positive for fatigue Eyes: negative Ears, nose, mouth, throat, and face: negative Respiratory: negative Cardiovascular: negative Gastrointestinal: positive for nausea Genitourinary:negative Integument/breast: negative Hematologic/lymphatic: negative Musculoskeletal:negative Neurological: negative Behavioral/Psych: negative Endocrine: negative Allergic/Immunologic: negative   PHYSICAL EXAMINATION: General appearance: alert, cooperative, fatigued and no distress Head: Normocephalic, without obvious abnormality, atraumatic Neck: no adenopathy, no JVD, supple, symmetrical, trachea midline and thyroid not enlarged, symmetric, no tenderness/mass/nodules Lymph nodes: Cervical, supraclavicular, and axillary nodes normal. Resp: clear to auscultation bilaterally Back: symmetric, no curvature. ROM normal. No CVA tenderness. Cardio: regular rate and rhythm, S1, S2 normal, no murmur, click, rub or gallop GI: soft, non-tender; bowel sounds normal; no masses,  no organomegaly Extremities: extremities normal, atraumatic, no cyanosis or edema Neurologic: Alert and oriented X 3, normal strength and tone. Normal symmetric reflexes. Normal coordination and gait  ECOG PERFORMANCE STATUS: 1 - Symptomatic but completely ambulatory  Blood pressure 126/83, pulse 92, temperature 97.6 F (36.4 C), temperature source Tympanic, resp. rate 17, height $RemoveBe'5\' 8"'GLeYfWmlE$  (1.727 m), weight 171 lb 9.6 oz (77.8 kg), SpO2 100 %.  LABORATORY DATA: Lab Results  Component Value Date  WBC 9.7 01/06/2020   HGB 10.8 (L) 01/06/2020   HCT 32.5 (L) 01/06/2020   MCV 93.4 01/06/2020   PLT 284 01/06/2020      Chemistry      Component Value Date/Time   NA 139 01/06/2020 0837   NA 141 03/08/2017 1707   K 3.6 01/06/2020 0837   CL 105 01/06/2020 0837   CO2 26 01/06/2020 0837   BUN 8 01/06/2020 0837   BUN 12 03/08/2017 1707   CREATININE 0.67 01/06/2020 0837      Component Value Date/Time   CALCIUM 9.4  01/06/2020 0837   ALKPHOS 52 01/06/2020 0837   AST 16 01/06/2020 0837   ALT 22 01/06/2020 0837   BILITOT 0.5 01/06/2020 0837       RADIOGRAPHIC STUDIES: CT Chest W Contrast  Result Date: 01/31/2020 CLINICAL DATA:  Non-small cell lung cancer. Assess treatment response. EXAM: CT CHEST WITH CONTRAST TECHNIQUE: Multidetector CT imaging of the chest was performed during intravenous contrast administration. CONTRAST:  68mL OMNIPAQUE IOHEXOL 300 MG/ML  SOLN COMPARISON:  CT 11/01/2019, PET-CT 7211 FINDINGS: Cardiovascular: No significant vascular findings. Normal heart size. No pericardial effusion. Mediastinum/Nodes: Port in the anterior chest wall with tip in distal SVC. No axillary or supraclavicular adenopathy. No mediastinal or hilar adenopathy. No pericardial fluid. Esophagus normal. Lungs/Pleura: Interval decrease in volume of the RIGHT infrahilar mass which measures 3.3 x 2.4 cm in axial dimension compared to 5.4 x 4.3 cm on CT 11/01/2019. Upper Abdomen: Limited view of the liver, kidneys, pancreas are unremarkable. Normal adrenal glands. Low-density lesion in the LEFT hepatic lobe measuring 13 mm is unchanged and was not hypermetabolic on comparison PET-CT scan. Additionally lesion not changed from CT 06/15/2016. Musculoskeletal: No acute osseous abnormality. IMPRESSION: 1. Interval decrease in volume of RIGHT infrahilar mass. 2. No evidence of metastatic mediastinal lymphadenopathy. 3. No new or progressive disease. 4. Benign LEFT hepatic lobe hypodensity. Electronically Signed   By: Suzy Bouchard M.D.   On: 01/31/2020 10:00    ASSESSMENT AND PLAN: This is a very pleasant 59 years old white male with a stage IV (t3, N0, M1c) non-small cell lung cancer, adenocarcinoma with MSI high presented with right lower lobe/infrahilar mass in addition to solitary brain metastasis in the left cerebellum diagnosed in July 2021. He is status post SRS to the solitary brain metastasis. The patient completed a  course of concurrent chemoradiation with weekly carboplatin and paclitaxel.  He tolerated the treatment well except for fatigue and mild odynophagia. He had repeat CT scan of the chest performed recently.  I personally and independently reviewed the scans and discussed the results with the patient and his wife. His scan showed interval decrease in the volume of the right infrahilar mass with no other evidence of metastatic disease. The patient has MSI high and I recommended for him treatment with immunotherapy with single agent Keytruda 200 mg IV every 3 weeks for a total of 2 years unless the patient has unacceptable toxicity or disease progression. The patient agreed to the current plan.  I discussed with him and his wife the adverse effect of this treatment including but not limited to immunotherapy mediated skin rash, diarrhea, inflammation of the lung, kidney, liver, thyroid or other endocrine dysfunction. He is expected to start the first cycle of this treatment on February 10, 2020. The patient will come back for follow-up visit in 4 weeks for evaluation with the start of cycle #2. He will continue to taper his dose of Decadron and  starting next week he will be on 2 mg p.o. daily. He was advised to call immediately if he has any concerning symptoms in the interval. The patient voices understanding of current disease status and treatment options and is in agreement with the current care plan.  All questions were answered. The patient knows to call the clinic with any problems, questions or concerns. We can certainly see the patient much sooner if necessary.  Disclaimer: This note was dictated with voice recognition software. Similar sounding words can inadvertently be transcribed and may not be corrected upon review.

## 2020-02-03 NOTE — Telephone Encounter (Signed)
  Radiation Oncology         [phone] ________________________________  Name: Joseph Atkins MRN: [id number]  Date of Service: 02/03/2020  DOB: 1961/01/31  Post Treatment Telephone Note  Diagnosis:  Stage IV, cT3, N0, M1c, non-small cell lung cancer, adenocarcinoma of the right lower lobe  Interval Since Last Radiation:  4 weeks   11/25/19 - 01/09/20: The patient was treated to the disease within the right lung initially to a dose of 60 Gy using a 4 field, 3-D conformal technique. The patient then received a cone down boost treatment for an additional 6 Gy. This yielded a final total dose of 66 Gy.   11/14/19 SRS Treatment: ExacTrac, 4 vmat beams, max dose=125.6% PTV1 Lt Cerebellum 44mm  Narrative:  The patient was contacted today for routine follow-up. During treatment he did very well with radiotherapy and did not have significant desquamation.   Impression/Plan: 1. Stage IV, cT3, N0, M1c, non-small cell lung cancer, adenocarcinoma of the right lower lobe. The patient has been doing well since completion of radiotherapy in review of his notes from other providers. He did have some difficulty with progressive symptoms of edema despite steroid taper, but this has been re-dosed by Dr. Mickeal Skinner. I left a voicemail and on it, I discussed that we would be happy to continue to follow him as needed, but he will also continue to follow up with Dr. Mickeal Skinner in Neuro Oncology and with Dr. Julien Nordmann in medical oncology.     Carola Rhine, PAC

## 2020-02-05 ENCOUNTER — Telehealth: Payer: Self-pay | Admitting: Medical Oncology

## 2020-02-05 NOTE — Telephone Encounter (Signed)
Kelly sent fax to let her know if pt needs Home Health , DME ,Oxygen , Hospice and she can help arrange. Faxed last office note to her.

## 2020-02-10 ENCOUNTER — Telehealth: Payer: Self-pay | Admitting: Medical Oncology

## 2020-02-10 ENCOUNTER — Telehealth: Payer: Self-pay

## 2020-02-10 NOTE — Telephone Encounter (Signed)
F/U rash -"from head to toe, itches. Started saturday. Benadryl helps temporarily-he took 2 tablets today. Reports he has blisters on his feet.  appt tomorrow to see provider before infusion. Schedule message sent to see Lucianne Lei in [facility].

## 2020-02-10 NOTE — Telephone Encounter (Signed)
Patient called to report he has a red rash from head to toe that itches severely. States the rash appeared Saturday. He states he took Benadryl and that helped a little bit but not much. Patient also states he has small blisters on his feet. He states he is tapering his Decadron as directed. Message routed to Dr. Julien Nordmann and his nurse. Will await response.

## 2020-02-10 NOTE — Progress Notes (Signed)
Pharmacist Chemotherapy Monitoring - Initial Assessment    Anticipated start date: 02/11/20   Regimen:  . Are orders appropriate based on the patient's diagnosis, regimen, and cycle? Yes . Does the plan date match the patient's scheduled date? Yes . Is the sequencing of drugs appropriate? Yes . Are the premedications appropriate for the patient's regimen? Yes . Prior Authorization for treatment is: Pending o If applicable, is the correct biosimilar selected based on the patient's insurance? not applicable  Organ Function and Labs: Marland Kitchen Are dose adjustments needed based on the patient's renal function, hepatic function, or hematologic function? Yes . Are appropriate labs ordered prior to the start of patient's treatment? Yes . Other organ system assessment, if indicated: N/A . The following baseline labs, if indicated, have been ordered: pembrolizumab: baseline TSH +/- T4  Dose Assessment: . Are the drug doses appropriate? Yes . Are the following correct: o Drug concentrations Yes o IV fluid compatible with drug Yes o Administration routes Yes o Timing of therapy Yes . If applicable, does the patient have documented access for treatment and/or plans for port-a-cath placement? yes . If applicable, have lifetime cumulative doses been properly documented and assessed? not applicable Lifetime Dose Tracking  . Carboplatin: 1,770 mg = 0.01 % of the maximum lifetime dose of 999,999,999 mg  o   Toxicity Monitoring/Prevention: . The patient has the following take home antiemetics prescribed: Ondansetron and Prochlorperazine . The patient has the following take home medications prescribed: N/A . Medication allergies and previous infusion related reactions, if applicable, have been reviewed and addressed. Yes . The patient's current medication list has been assessed for drug-drug interactions with their chemotherapy regimen. no significant drug-drug interactions were identified on  review.  Order Review: . Are the treatment plan orders signed? Yes . Is the patient scheduled to see a provider prior to their treatment? No  I verify that I have reviewed each item in the above checklist and answered each question accordingly.  Romualdo Bolk [facility] 02/10/2020 1:10 PM

## 2020-02-11 ENCOUNTER — Inpatient Hospital Stay: Payer: Managed Care, Other (non HMO)

## 2020-02-11 ENCOUNTER — Inpatient Hospital Stay (HOSPITAL_BASED_OUTPATIENT_CLINIC_OR_DEPARTMENT_OTHER): Payer: Managed Care, Other (non HMO) | Admitting: Medical

## 2020-02-11 ENCOUNTER — Other Ambulatory Visit: Payer: Self-pay

## 2020-02-11 VITALS — BP 122/90 | HR 81 | Temp 95.5°F | Resp 18 | Ht 68.0 in | Wt 168.6 lb

## 2020-02-11 DIAGNOSIS — C3491 Malignant neoplasm of unspecified part of right bronchus or lung: Secondary | ICD-10-CM

## 2020-02-11 DIAGNOSIS — G4489 Other headache syndrome: Secondary | ICD-10-CM | POA: Diagnosis not present

## 2020-02-11 DIAGNOSIS — C3431 Malignant neoplasm of lower lobe, right bronchus or lung: Secondary | ICD-10-CM | POA: Diagnosis not present

## 2020-02-11 DIAGNOSIS — R110 Nausea: Secondary | ICD-10-CM | POA: Diagnosis not present

## 2020-02-11 DIAGNOSIS — C7931 Secondary malignant neoplasm of brain: Secondary | ICD-10-CM

## 2020-02-11 DIAGNOSIS — Z95828 Presence of other vascular implants and grafts: Secondary | ICD-10-CM

## 2020-02-11 LAB — CBC WITH DIFFERENTIAL (CANCER CENTER ONLY)
Abs Immature Granulocytes: 0.18 10*3/uL — ABNORMAL HIGH (ref 0.00–0.07)
Basophils Absolute: 0 10*3/uL (ref 0.0–0.1)
Basophils Relative: 0 %
Eosinophils Absolute: 0 10*3/uL (ref 0.0–0.5)
Eosinophils Relative: 1 %
HCT: 34.8 % — ABNORMAL LOW (ref 39.0–52.0)
Hemoglobin: 11.7 g/dL — ABNORMAL LOW (ref 13.0–17.0)
Immature Granulocytes: 3 %
Lymphocytes Relative: 13 %
Lymphs Abs: 0.7 10*3/uL (ref 0.7–4.0)
MCH: 34.2 pg — ABNORMAL HIGH (ref 26.0–34.0)
MCHC: 33.6 g/dL (ref 30.0–36.0)
MCV: 101.8 fL — ABNORMAL HIGH (ref 80.0–100.0)
Monocytes Absolute: 0.4 10*3/uL (ref 0.1–1.0)
Monocytes Relative: 7 %
Neutro Abs: 4.2 10*3/uL (ref 1.7–7.7)
Neutrophils Relative %: 76 %
Platelet Count: 208 10*3/uL (ref 150–400)
RBC: 3.42 MIL/uL — ABNORMAL LOW (ref 4.22–5.81)
RDW: 17.7 % — ABNORMAL HIGH (ref 11.5–15.5)
WBC Count: 5.5 10*3/uL (ref 4.0–10.5)
nRBC: 0.4 % — ABNORMAL HIGH (ref 0.0–0.2)

## 2020-02-11 LAB — CMP (CANCER CENTER ONLY)
ALT: 34 U/L (ref 0–44)
AST: 20 U/L (ref 15–41)
Albumin: 3.4 g/dL — ABNORMAL LOW (ref 3.5–5.0)
Alkaline Phosphatase: 68 U/L (ref 38–126)
Anion gap: 7 (ref 5–15)
BUN: 10 mg/dL (ref 6–20)
CO2: 25 mmol/L (ref 22–32)
Calcium: 9.2 mg/dL (ref 8.9–10.3)
Chloride: 105 mmol/L (ref 98–111)
Creatinine: 0.68 mg/dL (ref 0.61–1.24)
GFR, Estimated: >60 mL/min (ref >60)
Glucose, Bld: 108 mg/dL — ABNORMAL HIGH (ref 70–99)
Potassium: 3.8 mmol/L (ref 3.5–5.1)
Sodium: 137 mmol/L (ref 135–145)
Total Bilirubin: 0.7 mg/dL (ref 0.3–1.2)
Total Protein: 6.3 g/dL — ABNORMAL LOW (ref 6.5–8.1)

## 2020-02-11 LAB — TSH: TSH: 5.103 u[IU]/mL — ABNORMAL HIGH (ref 0.320–4.118)

## 2020-02-11 MED ORDER — SODIUM CHLORIDE 0.9 % IV SOLN
INTRAVENOUS | Status: DC
  Filled 2020-02-11 (×2): qty 250

## 2020-02-11 MED ORDER — ONDANSETRON HCL 4 MG/2ML IJ SOLN
8.0000 mg | Freq: Once | INTRAMUSCULAR | Status: AC
  Administered 2020-02-11: 8 mg via INTRAVENOUS

## 2020-02-11 MED ORDER — SODIUM CHLORIDE 0.9 % IV SOLN
10.0000 mg | Freq: Once | INTRAVENOUS | Status: AC
  Administered 2020-02-11: 10 mg via INTRAVENOUS
  Filled 2020-02-11: qty 10

## 2020-02-11 MED ORDER — ONDANSETRON HCL 4 MG PO TABS
4.0000 mg | ORAL_TABLET | Freq: Three times a day (TID) | ORAL | 2 refills | Status: DC | PRN
Start: 2020-02-11 — End: 2020-02-21

## 2020-02-11 MED ORDER — ONDANSETRON HCL 4 MG/2ML IJ SOLN
INTRAMUSCULAR | Status: AC
  Filled 2020-02-11: qty 4

## 2020-02-11 MED ORDER — HEPARIN SOD (PORK) LOCK FLUSH 100 UNIT/ML IV SOLN
500.0000 [IU] | Freq: Once | INTRAVENOUS | Status: AC
  Administered 2020-02-11: 500 [IU] via INTRAVENOUS
  Filled 2020-02-11: qty 5

## 2020-02-11 MED ORDER — SODIUM CHLORIDE 0.9% FLUSH
10.0000 mL | Freq: Once | INTRAVENOUS | Status: AC
  Administered 2020-02-11: 10 mL
  Filled 2020-02-11: qty 10

## 2020-02-11 NOTE — Patient Instructions (Signed)

## 2020-02-14 NOTE — Progress Notes (Signed)
[facility] Progress Note   Joseph Atkins [id number] [birth year] 59 y.o.  Joseph Atkins is managed by Dr. Fanny Bien. Joseph Atkins  Actively treated with chemotherapy/immunotherapy/hormonal therapy: Awaiting start of Joseph Atkins  Next scheduled appointment with provider: 02/25/2020 (Dr. Mickeal Skinner) 03/03/2020 (Dr. Julien Nordmann)  Assessment: Plan:    Nausea without vomiting - Plan: 0.9 %  sodium chloride infusion, ondansetron (ZOFRAN) injection 8 mg, dexamethasone (DECADRON) 10 mg in sodium chloride 0.9 % 50 mL IVPB  Other headache syndrome - Plan: dexamethasone (DECADRON) 10 mg in sodium chloride 0.9 % 50 mL IVPB  Port-A-Cath in place - Plan: heparin lock flush 100 unit/mL, sodium chloride flush (NS) 0.9 % injection 10 mL  Adenocarcinoma of right lung, stage 4 (HCC)  Brain metastasis (HCC)   Nausea and vomiting with headache: Joseph Atkins was given 1 L of normal saline, Decadron 10 mg IV and Zofran 8 mg IV x1.  Additionally his case was discussed with Dr. Mickeal Skinner with Dr. Mickeal Skinner agreeing to increase the patient's Decadron to 4 mg p.o. every morning and 2 mg p.o. every afternoon.  His continued goal is to taper this medication as the patient is scheduled to receive his first Keytruda infusion on 02/18/2020.  He was originally scheduled to receive this today but asked to have a delayed due to his recurrent nausea, vomiting, and headaches.  Metastatic adenocarcinoma of the right lung with brain metastasis: Joseph Atkins was scheduled to receive Keytruda today but has asked that this be delayed by 1 week due to his recurrent nausea, vomiting, and headaches.  Please see After Visit Summary for patient specific instructions.  Future Appointments  Date Time Provider [place]  02/18/2020  3:00 PM [facility] INFUSION [facility] None  02/21/2020  9:40 AM GI-315 MR 3 GI-315MRI GI-315 [address]  02/24/2020  7:00 AM [facility] CONFERENCE [facility] None  02/24/2020  3:30 PM  Hayden Pedro, PA-C [facility] None  02/25/2020 11:00 AM Mickeal Skinner, Acey Lav, MD [facility] None  03/03/2020  8:15 AM [facility] LAB [facility] None  03/03/2020  8:45 AM Curt Bears, MD [facility] None  03/03/2020  9:30 AM [facility] INFUSION [facility] None  03/23/2020 10:00 AM [facility] LAB [facility] None  03/23/2020 10:30 AM Heilingoetter, Cassandra L, PA-C [facility] None  03/23/2020 11:30 AM [facility] INFUSION [facility] None  04/13/2020  9:00 AM [facility] LAB [facility] None  04/13/2020  9:30 AM Curt Bears, MD [facility] None  04/13/2020 10:30 AM [facility] INFUSION [facility] None    No orders of the defined types were placed in this encounter.      Subjective:   Patient ID:  Joseph Atkins is a 59 y.o. (DOB [birth year]) male.  Chief Complaint: No chief complaint on file.   [name]  is a 59 y.o. male with a diagnosis of metastatic adenocarcinoma of the right lung with brain metastasis.  He presents to the clinic today with his wife.  He was scheduled to begin the [facility] today but has had a recurrence of nausea, vomiting, and headaches as he is tapering down his oral Decadron.  He is currently taking 4 mg p.o. every morning with his first dose at this level yesterday.  Since that time he has had a recurrence of his nausea, vomiting, and headaches.  He asked to delay his chemotherapy by 1 week.  He is only been taking Tylenol for his headache.   Medications: I have reviewed the patient's current medications.  Allergies:  Allergies  Allergen Reactions  . Penicillins  Other (See Comments)    Childhood allergy.  Has patient had a PCN reaction causing immediate rash, facial/tongue/throat swelling, SOB or lightheadedness with hypotension: unknown Has patient had a PCN reaction causing severe rash involving mucus membranes or skin necrosis: unknown Has patient had a PCN reaction that required hospitalization: unknown Has  patient had a PCN reaction occurring within the last 10 years: no If all of the above answers are "NO", then may proceed with Cephalosporin use.     Past Medical History:  Diagnosis Date  . Cancer ([place])    lung cancer  . Diverticulosis   . GERD (gastroesophageal reflux disease)   . Hx of small bowel obstruction   . Hyperlipidemia   . Hypothyroidism   . IBS (irritable bowel syndrome)   . Substance abuse (HCC)    Alcoholic, Drug addition  . Thyroid disease     Past Surgical History:  Procedure Laterality Date  . arm surgery Right   . BRONCHIAL BRUSHINGS  10/24/2019   Procedure: BRONCHIAL BRUSHINGS;  Surgeon: Collene Gobble, MD;  Location: [facility] ENDOSCOPY;  Service: Cardiopulmonary;;  right lower lobe   . BRONCHIAL BRUSHINGS  11/05/2019   Procedure: BRONCHIAL BRUSHINGS;  Surgeon: Collene Gobble, MD;  Location: [facility] ENDOSCOPY;  Service: Pulmonary;;  . BRONCHIAL NEEDLE ASPIRATION BIOPSY  10/24/2019   Procedure: BRONCHIAL NEEDLE ASPIRATION BIOPSIES;  Surgeon: Collene Gobble, MD;  Location: [place];  Service: Cardiopulmonary;;  . BRONCHIAL NEEDLE ASPIRATION BIOPSY  11/05/2019   Procedure: BRONCHIAL NEEDLE ASPIRATION BIOPSIES;  Surgeon: Collene Gobble, MD;  Location: [facility] ENDOSCOPY;  Service: Pulmonary;;  . ENDOBRONCHIAL ULTRASOUND N/A 10/24/2019   Procedure: ENDOBRONCHIAL ULTRASOUND;  Surgeon: Collene Gobble, MD;  Location: [place];  Service: Cardiopulmonary;  Laterality: N/A;  . FINGER SURGERY Right    Middle  . IR IMAGING GUIDED PORT INSERTION  11/19/2019  . VIDEO BRONCHOSCOPY N/A 10/24/2019   Procedure: VIDEO BRONCHOSCOPY WITHOUT FLUORO;  Surgeon: Collene Gobble, MD;  Location: [facility] ENDOSCOPY;  Service: Cardiopulmonary;  Laterality: N/A;  . VIDEO BRONCHOSCOPY WITH ENDOBRONCHIAL NAVIGATION N/A 11/05/2019   Procedure: VIDEO BRONCHOSCOPY WITH ENDOBRONCHIAL NAVIGATION;  Surgeon: Collene Gobble, MD;  Location: [place] ENDOSCOPY;  Service: Pulmonary;  Laterality: N/A;    Family History   Problem Relation Age of Onset  . Epilepsy Mother   . Heart disease Mother   . Heart disease Brother   . Lung cancer Paternal Uncle     Social History   Socioeconomic History  . Marital status: Married    Spouse name: Not on file  . Number of children: 2  . Years of education: Not on file  . Highest education level: Not on file  Occupational History  . Occupation: Buyer, retail: [employer]  Tobacco Use  . Smoking status: Current Every Day Smoker    Packs/day: 1.00    Types: Cigarettes  . Smokeless tobacco: Former Systems developer    Types: Secondary school teacher  . Vaping Use: Never used  Substance and Sexual Activity  . Alcohol use: No    Comment: Alcoholic clean for 6 years  . Drug use: No    Comment: Recovering Drug Addict-clean for 6 years  . Sexual activity: Not on file  Other Topics Concern  . Not on file  Social History Narrative  . Not on file   Social Determinants of Health   Financial Resource Strain: Medium Risk  . Difficulty of Paying Living Expenses: Somewhat hard  Food Insecurity: No  Food Insecurity  . Worried About Charity fundraiser in the Last Year: Never true  . Ran Out of Food in the Last Year: Never true  Transportation Needs: No Transportation Needs  . Lack of Transportation (Medical): No  . Lack of Transportation (Non-Medical): No  Physical Activity: Insufficiently Active  . Days of Exercise per Week: 5 days  . Minutes of Exercise per Session: 20 min  Stress: Stress Concern Present  . Feeling of Stress : Very much  Social Connections: Moderately Isolated  . Frequency of Communication with Friends and Family: More than three times a week  . Frequency of Social Gatherings with Friends and Family: More than three times a week  . Attends Religious Services: Never  . Active Member of Clubs or Organizations: No  . Attends Archivist Meetings: Never  . Marital Status: Married  Human resources officer Violence: Not At Risk  . Fear of  Current or Ex-Partner: No  . Emotionally Abused: No  . Physically Abused: No  . Sexually Abused: No    Past Medical History, Surgical history, Social history, and Family history were reviewed and updated as appropriate.   Please see review of systems for further details on the patient's review from today.   Review of Systems:  Review of Systems  Constitutional: Positive for appetite change. Negative for chills, diaphoresis and fever.  Respiratory: Negative for cough, choking, shortness of breath and wheezing.   Cardiovascular: Negative for chest pain and palpitations.  Gastrointestinal: Positive for nausea and vomiting. Negative for constipation and diarrhea.  Genitourinary: Negative for decreased urine volume.  Neurological: Positive for headaches.    Objective:   Physical Exam:  BP 122/90   Pulse 81   Temp (!) 95.5 F (35.3 C) (Tympanic)   Resp 18   Ht 5\' 8"  (1.727 m)   Wt 168 lb 9.6 oz (76.5 kg)   SpO2 98%   BMI 25.64 kg/m  ECOG: 1  Physical Exam Constitutional:      General: He is not in acute distress.    Appearance: He is not diaphoretic.  HENT:     Head: Normocephalic and atraumatic.  Eyes:     General: No scleral icterus.       Right eye: No discharge.        Left eye: No discharge.     Conjunctiva/sclera: Conjunctivae normal.  Cardiovascular:     Rate and Rhythm: Normal rate and regular rhythm.     Heart sounds: Normal heart sounds. No murmur heard.  No friction rub. No gallop.   Pulmonary:     Effort: Pulmonary effort is normal. No respiratory distress.     Breath sounds: Normal breath sounds. No wheezing or rales.  Abdominal:     General: Bowel sounds are normal. There is no distension.     Tenderness: There is no abdominal tenderness. There is no guarding.  Skin:    General: Skin is warm and dry.     Findings: No erythema or rash.  Neurological:     Mental Status: He is alert.     Coordination: Coordination normal.     Gait: Gait normal.   Psychiatric:        Mood and Affect: Mood normal.        Behavior: Behavior normal.        Thought Content: Thought content normal.        Judgment: Judgment normal.     Lab Review:     Component Value  Date/Time   NA 137 02/11/2020 1326   NA 141 03/08/2017 1707   K 3.8 02/11/2020 1326   CL 105 02/11/2020 1326   CO2 25 02/11/2020 1326   GLUCOSE 108 (H) 02/11/2020 1326   BUN 10 02/11/2020 1326   BUN 12 03/08/2017 1707   CREATININE 0.68 02/11/2020 1326   CALCIUM 9.2 02/11/2020 1326   PROT 6.3 (L) 02/11/2020 1326   PROT 7.1 03/08/2017 1707   ALBUMIN 3.4 (L) 02/11/2020 1326   ALBUMIN 4.5 03/08/2017 1707   AST 20 02/11/2020 1326   ALT 34 02/11/2020 1326   ALKPHOS 68 02/11/2020 1326   BILITOT 0.7 02/11/2020 1326   GFRNONAA >60 02/11/2020 1326   GFRAA >60 01/06/2020 0837       Component Value Date/Time   WBC 5.5 02/11/2020 1326   WBC 17.0 (H) 11/19/2019 1230   RBC 3.42 (L) 02/11/2020 1326   HGB 11.7 (L) 02/11/2020 1326   HGB 14.4 03/08/2017 1707   HCT 34.8 (L) 02/11/2020 1326   HCT 42.2 03/08/2017 1707   PLT 208 02/11/2020 1326   PLT 194 03/08/2017 1707   MCV 101.8 (H) 02/11/2020 1326   MCV 89 03/08/2017 1707   MCH 34.2 (H) 02/11/2020 1326   MCHC 33.6 02/11/2020 1326   RDW 17.7 (H) 02/11/2020 1326   RDW 13.1 03/08/2017 1707   LYMPHSABS 0.7 02/11/2020 1326   LYMPHSABS 3.0 03/08/2017 1707   MONOABS 0.4 02/11/2020 1326   EOSABS 0.0 02/11/2020 1326   EOSABS 0.2 03/08/2017 1707   BASOSABS 0.0 02/11/2020 1326   BASOSABS 0.1 03/08/2017 1707   -------------------------------  Imaging from last 24 hours (if applicable):  Radiology interpretation: CT Chest W Contrast  Result Date: 01/31/2020 CLINICAL DATA:  Non-small cell lung cancer. Assess treatment response. EXAM: CT CHEST WITH CONTRAST TECHNIQUE: Multidetector CT imaging of the chest was performed during intravenous contrast administration. CONTRAST:  42mL OMNIPAQUE IOHEXOL 300 MG/ML  SOLN COMPARISON:  CT  11/01/2019, PET-CT 7211 FINDINGS: Cardiovascular: No significant vascular findings. Normal heart size. No pericardial effusion. Mediastinum/Nodes: Port in the anterior chest wall with tip in distal SVC. No axillary or supraclavicular adenopathy. No mediastinal or hilar adenopathy. No pericardial fluid. Esophagus normal. Lungs/Pleura: Interval decrease in volume of the RIGHT infrahilar mass which measures 3.3 x 2.4 cm in axial dimension compared to 5.4 x 4.3 cm on CT 11/01/2019. Upper Abdomen: Limited view of the liver, kidneys, pancreas are unremarkable. Normal adrenal glands. Low-density lesion in the LEFT hepatic lobe measuring 13 mm is unchanged and was not hypermetabolic on comparison PET-CT scan. Additionally lesion not changed from CT 06/15/2016. Musculoskeletal: No acute osseous abnormality. IMPRESSION: 1. Interval decrease in volume of RIGHT infrahilar mass. 2. No evidence of metastatic mediastinal lymphadenopathy. 3. No new or progressive disease. 4. Benign LEFT hepatic lobe hypodensity. Electronically Signed   By: Suzy Bouchard M.D.   On: 01/31/2020 10:00        This case was discussed with Dr. Cecil Cobbs.

## 2020-02-14 NOTE — Addendum Note (Signed)
Addended by: Harle Stanford on: 02/14/2020 04:44 PM   Modules accepted: Orders

## 2020-02-18 ENCOUNTER — Inpatient Hospital Stay: Payer: Managed Care, Other (non HMO)

## 2020-02-18 ENCOUNTER — Inpatient Hospital Stay (HOSPITAL_BASED_OUTPATIENT_CLINIC_OR_DEPARTMENT_OTHER): Payer: Managed Care, Other (non HMO) | Admitting: Medical

## 2020-02-18 ENCOUNTER — Other Ambulatory Visit: Payer: Self-pay

## 2020-02-18 VITALS — BP 135/88 | HR 84 | Temp 96.8°F | Resp 18 | Ht 68.0 in | Wt 176.0 lb

## 2020-02-18 DIAGNOSIS — C3491 Malignant neoplasm of unspecified part of right bronchus or lung: Secondary | ICD-10-CM

## 2020-02-18 DIAGNOSIS — F321 Major depressive disorder, single episode, moderate: Secondary | ICD-10-CM

## 2020-02-18 DIAGNOSIS — F419 Anxiety disorder, unspecified: Secondary | ICD-10-CM

## 2020-02-18 DIAGNOSIS — C3431 Malignant neoplasm of lower lobe, right bronchus or lung: Secondary | ICD-10-CM | POA: Diagnosis not present

## 2020-02-18 DIAGNOSIS — F5101 Primary insomnia: Secondary | ICD-10-CM

## 2020-02-18 DIAGNOSIS — G4489 Other headache syndrome: Secondary | ICD-10-CM

## 2020-02-18 DIAGNOSIS — C7931 Secondary malignant neoplasm of brain: Secondary | ICD-10-CM

## 2020-02-18 DIAGNOSIS — R110 Nausea: Secondary | ICD-10-CM

## 2020-02-18 LAB — CBC WITH DIFFERENTIAL (CANCER CENTER ONLY)
Abs Immature Granulocytes: 0.45 10*3/uL — ABNORMAL HIGH (ref 0.00–0.07)
Basophils Absolute: 0 10*3/uL (ref 0.0–0.1)
Basophils Relative: 1 %
Eosinophils Absolute: 0 10*3/uL (ref 0.0–0.5)
Eosinophils Relative: 0 %
HCT: 35.3 % — ABNORMAL LOW (ref 39.0–52.0)
Hemoglobin: 11.3 g/dL — ABNORMAL LOW (ref 13.0–17.0)
Immature Granulocytes: 5 %
Lymphocytes Relative: 12 %
Lymphs Abs: 1 10*3/uL (ref 0.7–4.0)
MCH: 34 pg (ref 26.0–34.0)
MCHC: 32 g/dL (ref 30.0–36.0)
MCV: 106.3 fL — ABNORMAL HIGH (ref 80.0–100.0)
Monocytes Absolute: 0.7 10*3/uL (ref 0.1–1.0)
Monocytes Relative: 8 %
Neutro Abs: 6.4 10*3/uL (ref 1.7–7.7)
Neutrophils Relative %: 74 %
Platelet Count: 281 10*3/uL (ref 150–400)
RBC: 3.32 MIL/uL — ABNORMAL LOW (ref 4.22–5.81)
RDW: 17.5 % — ABNORMAL HIGH (ref 11.5–15.5)
WBC Count: 8.6 10*3/uL (ref 4.0–10.5)
nRBC: 0.8 % — ABNORMAL HIGH (ref 0.0–0.2)

## 2020-02-18 LAB — CMP (CANCER CENTER ONLY)
ALT: 50 U/L — ABNORMAL HIGH (ref 0–44)
AST: 21 U/L (ref 15–41)
Albumin: 3.8 g/dL (ref 3.5–5.0)
Alkaline Phosphatase: 65 U/L (ref 38–126)
Anion gap: 10 (ref 5–15)
BUN: 8 mg/dL (ref 6–20)
CO2: 26 mmol/L (ref 22–32)
Calcium: 9.3 mg/dL (ref 8.9–10.3)
Chloride: 105 mmol/L (ref 98–111)
Creatinine: 0.75 mg/dL (ref 0.61–1.24)
GFR, Estimated: >60 mL/min (ref >60)
Glucose, Bld: 95 mg/dL (ref 70–99)
Potassium: 4 mmol/L (ref 3.5–5.1)
Sodium: 141 mmol/L (ref 135–145)
Total Bilirubin: 0.4 mg/dL (ref 0.3–1.2)
Total Protein: 6.7 g/dL (ref 6.5–8.1)

## 2020-02-18 MED ORDER — DULOXETINE HCL 20 MG PO CPEP: 20.0000 mg | ORAL_CAPSULE | Freq: Two times a day (BID) | ORAL | 5 refills | Status: DC

## 2020-02-18 MED ORDER — SODIUM CHLORIDE 0.9 % IV SOLN
Freq: Once | INTRAVENOUS | Status: AC
  Filled 2020-02-18: qty 250

## 2020-02-18 MED ORDER — SODIUM CHLORIDE 0.9 % IV SOLN
200.0000 mg | Freq: Once | INTRAVENOUS | Status: AC
  Administered 2020-02-18: 200 mg via INTRAVENOUS
  Filled 2020-02-18: qty 8

## 2020-02-18 MED ORDER — HEPARIN SOD (PORK) LOCK FLUSH 100 UNIT/ML IV SOLN
500.0000 [IU] | Freq: Once | INTRAVENOUS | Status: AC | PRN
  Administered 2020-02-18: 500 [IU]
  Filled 2020-02-18: qty 5

## 2020-02-18 MED ORDER — ZOLPIDEM TARTRATE ER 12.5 MG PO TBCR: 12.5000 mg | EXTENDED_RELEASE_TABLET | Freq: Every evening | ORAL | 5 refills | Status: DC | PRN

## 2020-02-18 MED ORDER — SODIUM CHLORIDE 0.9% FLUSH
10.0000 mL | INTRAVENOUS | Status: DC | PRN
  Administered 2020-02-18: 10 mL
  Filled 2020-02-18: qty 10

## 2020-02-18 NOTE — Progress Notes (Signed)
Pt is on Dex 2 mg BID ( = 26 mg prednisone daily).  Dr. Julien Nordmann aware & wishes to proceed w/ treatment.  Kennith Center, Pharm.D., CPP 02/18/2020@3 :22 PM

## 2020-02-18 NOTE — Patient Instructions (Signed)
[place] Discharge Instructions for Patients Receiving Chemotherapy  Today you received the following chemotherapy agents Keytruda  To help prevent nausea and vomiting after your treatment, we encourage you to take your nausea medication as directed If you develop nausea and vomiting that is not controlled by your nausea medication, call the clinic.   BELOW ARE SYMPTOMS THAT SHOULD BE REPORTED IMMEDIATELY:  *FEVER GREATER THAN 100.5 F  *CHILLS WITH OR WITHOUT FEVER  NAUSEA AND VOMITING THAT IS NOT CONTROLLED WITH YOUR NAUSEA MEDICATION  *UNUSUAL SHORTNESS OF BREATH  *UNUSUAL BRUISING OR BLEEDING  TENDERNESS IN MOUTH AND THROAT WITH OR WITHOUT PRESENCE OF ULCERS  *URINARY PROBLEMS  *BOWEL PROBLEMS  UNUSUAL RASH Items with * indicate a potential emergency and should be followed up as soon as possible.  Feel free to call the clinic should you have any questions or concerns. The clinic phone number is [phone].  Please show the [place] at check-in to the Emergency Department and triage nurse.  Pembrolizumab injection What is this medicine? PEMBROLIZUMAB (pem broe liz ue mab) is a monoclonal antibody. It is used to treat certain types of cancer. This medicine may be used for other purposes; ask your health care provider or pharmacist if you have questions. COMMON BRAND NAME(S): Keytruda What should I tell my health care provider before I take this medicine? They need to know if you have any of these conditions:  diabetes  immune system problems  inflammatory bowel disease  liver disease  lung or breathing disease  lupus  received or scheduled to receive an organ transplant or a stem-cell transplant that uses donor stem cells  an unusual or allergic reaction to pembrolizumab, other medicines, foods, dyes, or preservatives  pregnant or trying to get pregnant  breast-feeding How should I use this medicine? This medicine is for  infusion into a vein. It is given by a health care professional in a hospital or clinic setting. A special MedGuide will be given to you before each treatment. Be sure to read this information carefully each time. Talk to your pediatrician regarding the use of this medicine in children. While this drug may be prescribed for children as young as 6 months for selected conditions, precautions do apply. Overdosage: If you think you have taken too much of this medicine contact a poison control center or emergency room at once. NOTE: This medicine is only for you. Do not share this medicine with others. What if I miss a dose? It is important not to miss your dose. Call your doctor or health care professional if you are unable to keep an appointment. What may interact with this medicine? Interactions have not been studied. Give your health care provider a list of all the medicines, herbs, non-prescription drugs, or dietary supplements you use. Also tell them if you smoke, drink alcohol, or use illegal drugs. Some items may interact with your medicine. This list may not describe all possible interactions. Give your health care provider a list of all the medicines, herbs, non-prescription drugs, or dietary supplements you use. Also tell them if you smoke, drink alcohol, or use illegal drugs. Some items may interact with your medicine. What should I watch for while using this medicine? Your condition will be monitored carefully while you are receiving this medicine. You may need blood work done while you are taking this medicine. Do not become pregnant while taking this medicine or for 4 months after stopping it. Women should inform their  doctor if they wish to become pregnant or think they might be pregnant. There is a potential for serious side effects to an unborn child. Talk to your health care professional or pharmacist for more information. Do not breast-feed an infant while taking this medicine or for 4  months after the last dose. What side effects may I notice from receiving this medicine? Side effects that you should report to your doctor or health care professional as soon as possible:  allergic reactions like skin rash, itching or hives, swelling of the face, lips, or tongue  bloody or black, tarry  breathing problems  changes in vision  chest pain  chills  confusion  constipation  cough  diarrhea  dizziness or feeling faint or lightheaded  fast or irregular heartbeat  fever  flushing  joint pain  low blood counts - this medicine may decrease the number of white blood cells, red blood cells and platelets. You may be at increased risk for infections and bleeding.  muscle pain  muscle weakness  pain, tingling, numbness in the hands or feet  persistent headache  redness, blistering, peeling or loosening of the skin, including inside the mouth  signs and symptoms of high blood sugar such as dizziness; dry mouth; dry skin; fruity breath; nausea; stomach pain; increased hunger or thirst; increased urination  signs and symptoms of kidney injury like trouble passing urine or change in the amount of urine  signs and symptoms of liver injury like dark urine, light-colored stools, loss of appetite, nausea, right upper belly pain, yellowing of the eyes or skin  sweating  swollen lymph nodes  weight loss Side effects that usually do not require medical attention (report to your doctor or health care professional if they continue or are bothersome):  decreased appetite  hair loss  muscle pain  tiredness This list may not describe all possible side effects. Call your doctor for medical advice about side effects. You may report side effects to FDA at 1-800-FDA-1088. Where should I keep my medicine? This drug is given in a hospital or clinic and will not be stored at home. NOTE: This sheet is a summary. It may not cover all possible information. If you have  questions about this medicine, talk to your doctor, pharmacist, or health care provider.  2020 Elsevier/Gold Standard (2019-02-15 18:07:58)

## 2020-02-18 NOTE — Patient Instructions (Signed)

## 2020-02-19 ENCOUNTER — Telehealth: Payer: Self-pay | Admitting: *Deleted

## 2020-02-20 ENCOUNTER — Other Ambulatory Visit: Payer: Self-pay

## 2020-02-20 ENCOUNTER — Encounter: Payer: Self-pay | Admitting: Radiation Oncology

## 2020-02-21 ENCOUNTER — Ambulatory Visit
Admission: RE | Admit: 2020-02-21 | Discharge: 2020-02-21 | Disposition: A | Discharge status: Home / Self Care | Payer: Managed Care, Other (non HMO) | Source: Ambulatory Visit | Attending: Internal Medicine | Admitting: Internal Medicine

## 2020-02-21 ENCOUNTER — Inpatient Hospital Stay (HOSPITAL_BASED_OUTPATIENT_CLINIC_OR_DEPARTMENT_OTHER): Payer: Managed Care, Other (non HMO) | Admitting: Medical

## 2020-02-21 ENCOUNTER — Other Ambulatory Visit: Payer: Self-pay

## 2020-02-21 ENCOUNTER — Telehealth: Payer: Self-pay | Admitting: Emergency Medicine

## 2020-02-21 VITALS — BP 154/99 | HR 97 | Temp 97.2°F | Resp 19 | Ht 68.0 in | Wt 167.6 lb

## 2020-02-21 DIAGNOSIS — C3491 Malignant neoplasm of unspecified part of right bronchus or lung: Secondary | ICD-10-CM

## 2020-02-21 DIAGNOSIS — C7931 Secondary malignant neoplasm of brain: Secondary | ICD-10-CM

## 2020-02-21 DIAGNOSIS — C7949 Secondary malignant neoplasm of other parts of nervous system: Secondary | ICD-10-CM

## 2020-02-21 DIAGNOSIS — R112 Nausea with vomiting, unspecified: Secondary | ICD-10-CM

## 2020-02-21 IMAGING — MR MR HEAD WO/W CM
11 series · 48 of 48 positions shown · IV contrast (16 ML MULTIHANCE)
Comparison: MRI of the brain [DATE].

CLINICAL DATA: Brain/CNS neoplasm, assess treatment response.

EXAM:
MRI HEAD WITHOUT AND WITH CONTRAST
TECHNIQUE: Multiplanar, multiecho pulse sequences of the brain and surrounding
structures were obtained without and with intravenous contrast.
CONTRAST:  16mL MULTIHANCE GADOBENATE DIMEGLUMINE 529 MG/ML IV SOLN

[Series 2: FLAIR · sagittal · 3.0mm · 0.75mm/px · 2 of 42 slices shown (1 of 2)]
[im 1/42]
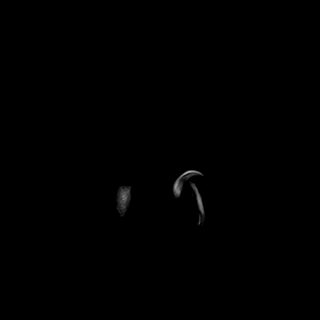
[im 42/42]
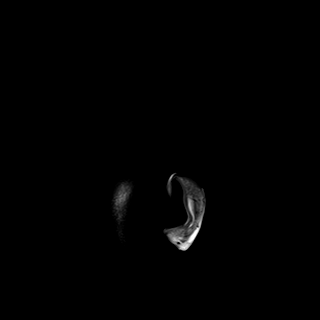

[Series 3: DWI · axial · 3.0mm · 1.50mm/px · z∈[-64,+84]mm · 4 of 78 slices shown (1 of 2)]
[im 1/78]
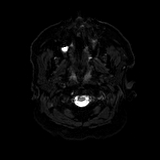
[im 26/78]
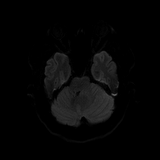
[im 52/78]
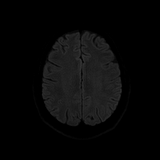
[im 78/78]
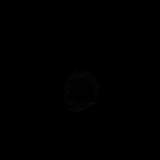

[Series 4: DWI · axial · 3.0mm · 1.50mm/px · z∈[-64,+84]mm · 2 of 39 slices shown (2 of 2)]
[im 1/39]
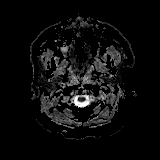
[im 39/39]
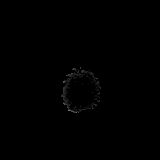

[Series 5: T2 · axial · 5.0mm · 0.57mm/px · z∈[-68,+88]mm · 2 of 27 slices shown (1 of 2)]
[im 1/27]
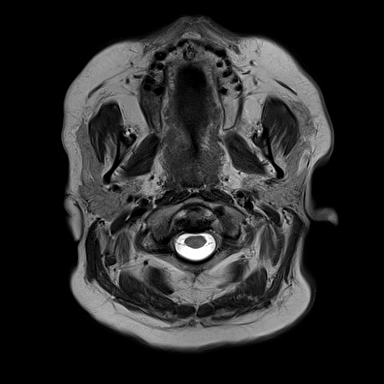
[im 27/27]
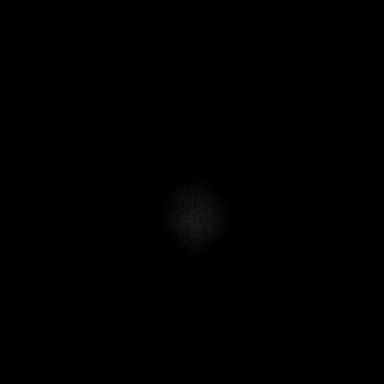

[Series 7: swi_images · axial · 1.5mm · 0.90mm/px · z∈[-65,+77]mm · 6 of 96 slices shown]
[im 1/96]
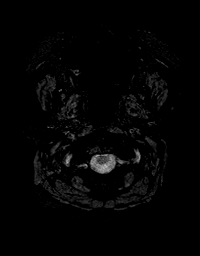
[im 20/96]
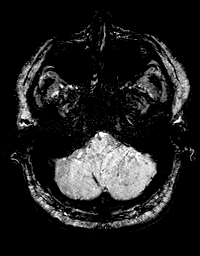
[im 39/96]
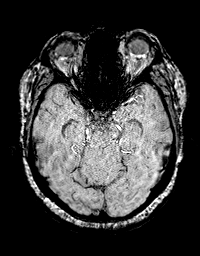
[im 58/96]
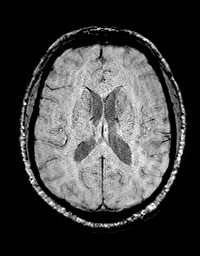
[im 77/96]
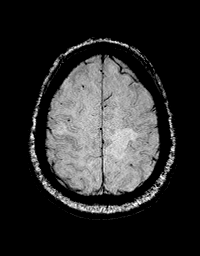
[im 96/96]
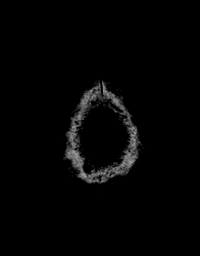

[Series 8: FLAIR · axial · 3.0mm · 0.86mm/px · z∈[-76,+89]mm · 3 of 56 slices shown (2 of 2)]
[im 1/56]
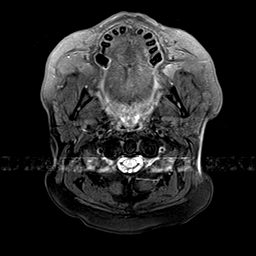
[im 28/56]
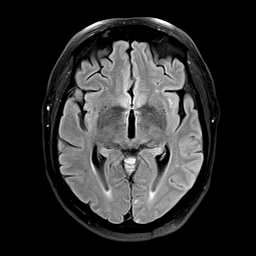
[im 56/56]
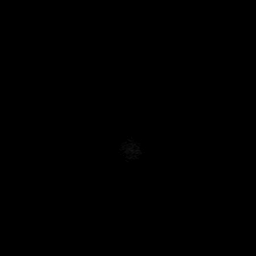

[Series 9: T2 · axial · non-contrast · 1.0mm · 0.86mm/px · z∈[-67,+89]mm · 10 of 160 slices shown (2 of 2)]
[im 1/160]
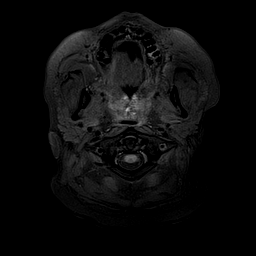
[im 18/160]
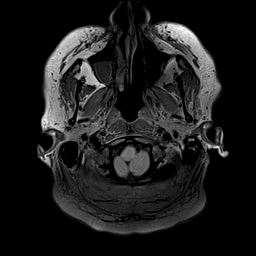
[im 36/160]
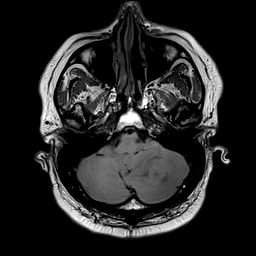
[im 54/160]
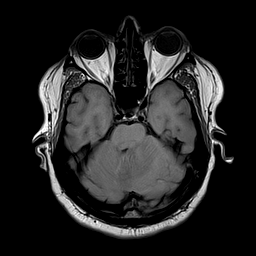
[im 71/160]
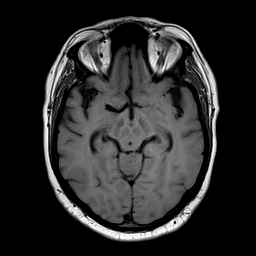
[im 89/160]
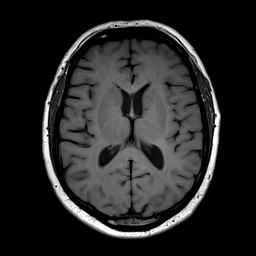
[im 107/160]
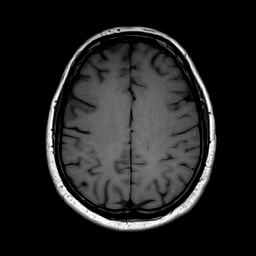
[im 124/160]
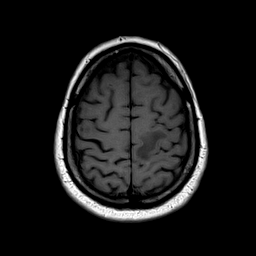
[im 142/160]
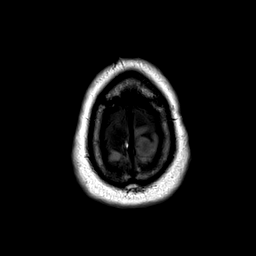
[im 160/160]
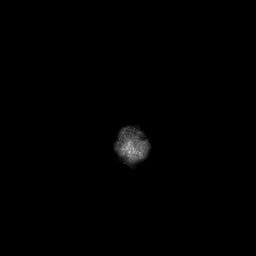

[Series 10: T2 post-contrast · coronal · 3.0mm · 0.57mm/px · 3 of 47 slices shown (1 of 2)]
[im 1/47]
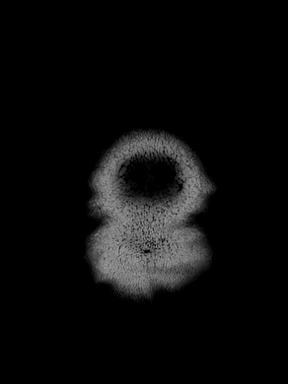
[im 24/47]
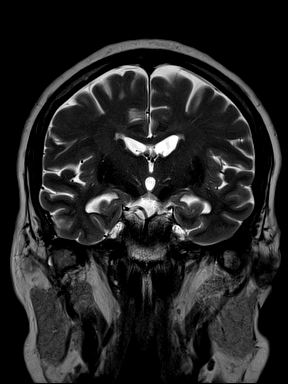
[im 47/47]
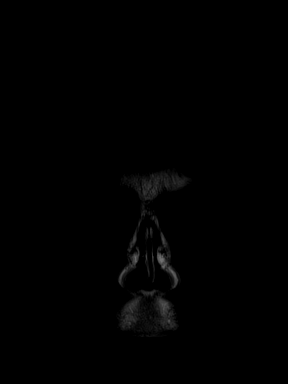

[Series 11: T2 post-contrast · axial · 1.0mm · 0.86mm/px · z∈[-67,+89]mm · 10 of 160 slices shown (2 of 2)]
[im 1/160]
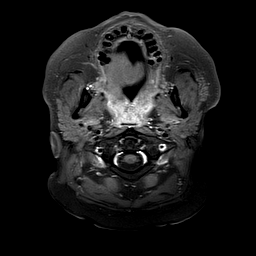
[im 18/160]
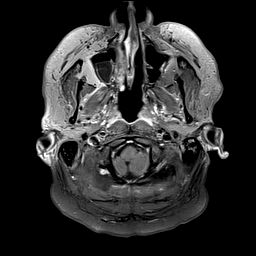
[im 36/160]
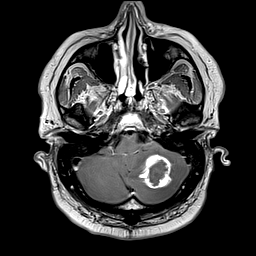
[im 54/160]
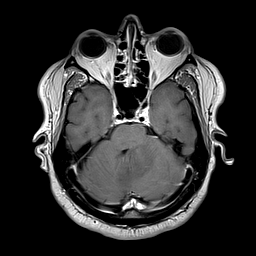
[im 71/160]
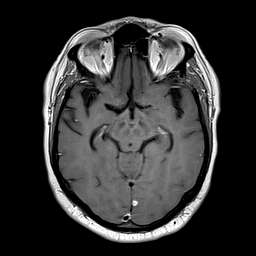
[im 89/160]
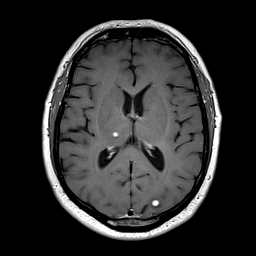
[im 107/160]
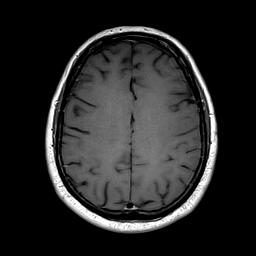
[im 124/160]
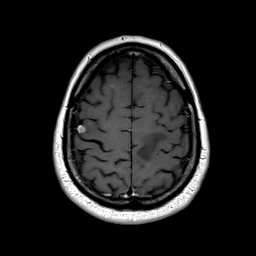
[im 142/160]
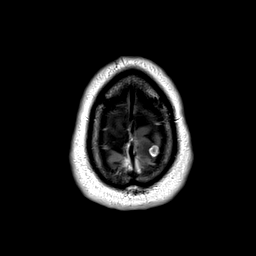
[im 160/160]
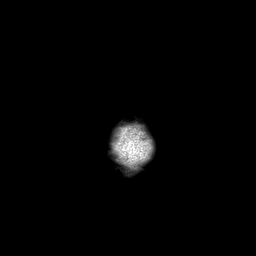

[Series 12: T1 post-contrast · coronal · 3.0mm · 0.57mm/px · 3 of 47 slices shown]
[im 1/47]
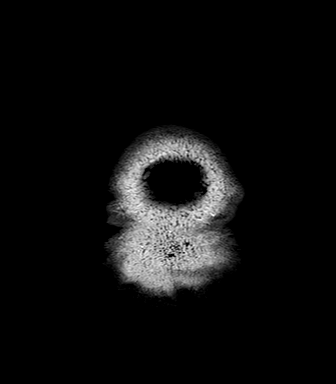
[im 24/47]
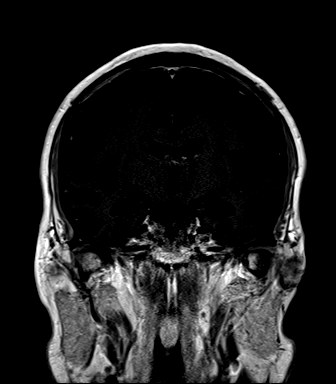
[im 47/47]
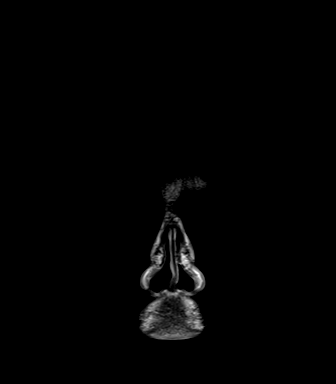

[Series 13: FLAIR post-contrast · sagittal · 3.0mm · 0.75mm/px · 3 of 42 slices shown]
[im 1/42]
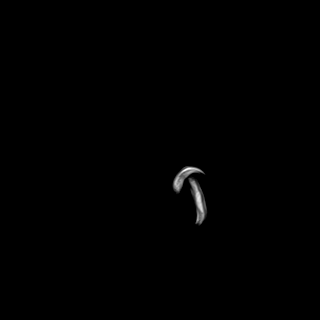
[im 21/42]
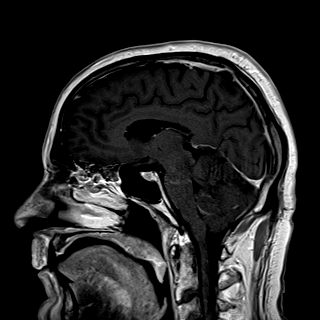
[im 42/42]
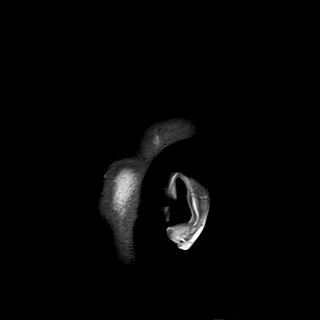

[48 of 48 positions shown; findings below may reference images not displayed]

FINDINGS: Brain: No acute infarction, hemorrhage, hydrocephalus or extra-axial
collection. Small amount of scattered foci of T2 hyperintensity
within the white matter of the cerebral hemispheres, nonspecific.

Necrotic lesion with rim irregular rim enhancement within the left
cerebellar hemisphere appear mildly enlarged compared to prior,
measuring approximately 27 x 24 x 18 mm (24 x 23 x 18 mm on prior).
There is interval development of contrast enhancement along the
cerebellar sulci, consistent with leptomeningeal spread. Prominent
surrounding vasogenic edema involving the left cerebellar
hemisphere, vermis and left middle cerebellar peduncle with mass
effect and rightward deviation of the fourth ventricle, cerebellar
vermis and left superior cerebellar peduncle. There is effacement of
the left cerebellopontine angle at the level of the left porus
acusticus.

Significant interval increase in size of a left posterior frontal
enhancing lesion involving the precentral gyrus measuring
approximately 9 x 9 mm (1 mm on prior) now with surrounding
vasogenic edema.

Interval development of multiple new enhancing lesions, labeled on
series 11:

*Right precentral gyrus, 7 mm, image 125;
*Right thalamus, 3 mm, image 89;
*Left occipital lobe, 6 mm, image 91;
*Left occipital lobe, 2 mm, image 75;
*Left occipital lobe, 5 mm, image 72;
*Right temporal lobe, 2 mm, image 72.

Vascular: Normal flow voids.

Skull and upper cervical spine: Degenerative changes of the cervical
spine. Otherwise, normal marrow signal.

Sinuses/Orbits: Mucous retention cyst within the right maxillary
sinus. The orbits are maintained.

Other: None.
IMPRESSION: 1. Mild increase in size of the left cerebellar hemisphere necrotic
lesion with interval development of leptomeningeal spread of
disease. Stable associated vasogenic edema and mass effect on the
fourth ventricle. No hydrocephalus.
2. Significant interval increase in size of a left posterior frontal
enhancing lesion measuring 9 mm (1 mm on prior) now with surrounding
vasogenic edema.
3. Interval development of multiple new enhancing lesions, as
described above.

These results will be called to the ordering clinician or
representative by the Radiologist Assistant, and communication
documented in the PACS or [REDACTED].

## 2020-02-21 MED ORDER — HEPARIN SOD (PORK) LOCK FLUSH 100 UNIT/ML IV SOLN
500.0000 [IU] | Freq: Once | INTRAVENOUS | Status: AC
  Administered 2020-02-21: 500 [IU] via INTRAVENOUS

## 2020-02-21 MED ORDER — ONDANSETRON HCL 4 MG PO TABS: 4.0000 mg | ORAL_TABLET | Freq: Three times a day (TID) | ORAL | 2 refills | Status: DC | PRN

## 2020-02-21 MED ORDER — PROCHLORPERAZINE MALEATE 10 MG PO TABS: 10.0000 mg | ORAL_TABLET | Freq: Four times a day (QID) | ORAL | 2 refills | Status: DC | PRN

## 2020-02-21 MED ORDER — SODIUM CHLORIDE 0.9% FLUSH
10.0000 mL | INTRAVENOUS | Status: DC | PRN
  Administered 2020-02-21: 10 mL via INTRAVENOUS

## 2020-02-21 MED ORDER — GADOBENATE DIMEGLUMINE 529 MG/ML IV SOLN
16.0000 mL | Freq: Once | INTRAVENOUS | Status: AC | PRN
  Administered 2020-02-21: 16 mL via INTRAVENOUS

## 2020-02-21 MED ORDER — DEXAMETHASONE 2 MG PO TABS: 4.0000 mg | ORAL_TABLET | Freq: Two times a day (BID) | ORAL | 2 refills | Status: DC

## 2020-02-21 NOTE — Telephone Encounter (Signed)
Returning call from pt this am requesting to see provider today.  Per Vaughan Basta pt was nauseous yesterday and today with 1-2 episodes of vomiting that were relieved by antiemetics.  Per Vaughan Basta he fell twice this morning without head injury and appears to be very unstable/dizzy today.  They agreed to come to [facility] for [facility] appt after his MRI today.  Per PA Lucianne Lei no labs at this time.

## 2020-02-21 NOTE — Patient Instructions (Signed)

## 2020-02-21 NOTE — Progress Notes (Signed)
Joseph Atkins presents to the clinic today with his wife.  He presents after having an MRI of his brain completed earlier today.  He reports he missed his morning dose of Decadron yesterday subsequently developed nausea, vomiting, headaches, and anorexia.  He took 2 mg of Decadron last evening at 11 PM and is feeling better today.  He request a refill of his nausea medications and Decadron.  His brain MRI returned showing:  FINDINGS: Brain: No acute infarction, hemorrhage, hydrocephalus or extra-axial collection. Small amount of scattered foci of T2 hyperintensity within the white matter of the cerebral hemispheres, nonspecific.  Necrotic lesion with rim irregular rim enhancement within the left cerebellar hemisphere appear mildly enlarged compared to prior, measuring approximately 27 x 24 x 18 mm (24 x 23 x 18 mm on prior). There is interval development of contrast enhancement along the cerebellar sulci, consistent with leptomeningeal spread. Prominent surrounding vasogenic edema involving the left cerebellar hemisphere, vermis and left middle cerebellar peduncle with mass effect and rightward deviation of the fourth ventricle, cerebellar vermis and left superior cerebellar peduncle. There is effacement of the left cerebellopontine angle at the level of the left porus acusticus.  Significant interval increase in size of a left posterior frontal enhancing lesion involving the precentral gyrus measuring approximately 9 x 9 mm (1 mm on prior) now with surrounding vasogenic edema.  Interval development of multiple new enhancing lesions, labeled on series 11:  *Right precentral gyrus, 7 mm, image 125; *Right thalamus, 3 mm, image 89; *Left occipital lobe, 6 mm, image 91; *Left occipital lobe, 2 mm, image 75; *Left occipital lobe, 5 mm, image 72; *Right temporal lobe, 2 mm, image 72.  Vascular: Normal flow voids.  Skull and upper cervical spine: Degenerative changes of the  cervical spine. Otherwise, normal marrow signal.  Sinuses/Orbits: Mucous retention cyst within the right maxillary sinus. The orbits are maintained.  Other: None.  IMPRESSION: 1. Mild increase in size of the left cerebellar hemisphere necrotic lesion with interval development of leptomeningeal spread of disease. Stable associated vasogenic edema and mass effect on the fourth ventricle. No hydrocephalus. 2. Significant interval increase in size of a left posterior frontal enhancing lesion measuring 9 mm (1 mm on prior) now with surrounding vasogenic edema. 3. Interval development of multiple new enhancing lesions, as described above.  These results were reviewed with Dr. Cecil Cobbs.  Dr. Mickeal Skinner reports that Joseph Atkins case will be presented to the tumor board next week with Joseph Atkins returning to see Dr. Mickeal Skinner on 02/25/2020.  Dr. Mickeal Skinner recommends increasing his Decadron back to 4 mg p.o. twice daily.  A prescription was sent for this along with Compazine and Zofran.   Sandi Mealy, MHS, PA-C Physician Assistant

## 2020-02-21 NOTE — Progress Notes (Signed)
[facility] Progress Note   BRAVE DACK [id number] 1960/08/25 59 y.o.  Joseph Atkins is managed by Dr. Julien Nordmann and Dr. Mickeal Skinner  Actively treated with chemotherapy/immunotherapy/hormonal therapy: no  Next scheduled appointment with provider: 02/25/2020 (Dr. Mickeal Skinner) 03/03/2020 (Dr. Julien Nordmann)  Assessment: Plan:    Adenocarcinoma of right lung, stage 4 ([place])  Brain metastasis ([place])  Anxiety - Plan: DULoxetine (CYMBALTA) 20 MG capsule  Current moderate episode of major depressive disorder, unspecified whether recurrent ([place]) - Plan: DULoxetine (CYMBALTA) 20 MG capsule  Primary insomnia - Plan: zolpidem (AMBIEN CR) 12.5 MG CR tablet   Metastatic adenocarcinoma of the lung with brain metastasis: [name] presents to the clinic today for consideration of cycle 1, day 1 of Keytruda.  This was discussed with Dr. Julien Nordmann today.  We will proceed with his treatment today.  [name] continues on Decadron 2 mg p.o. twice daily.  He will continue this dose for now.  He will be seen by Dr. Mickeal Skinner on 02/25/2020 after having an MRI of his brain completed on 02/21/2020.  Dr. Mickeal Skinner will be presenting his case to the tumor board on 02/24/2020.  Anxiety and depression: Patient was given a prescription for Cymbalta 20 mg p.o. twice daily.  He was told to begin once daily dosing of Cymbalta for 1 week then increase to twice daily dosing.  Insomnia: Patient was given a prescription for Ambien CR 12.5 mg p.o. nightly as needed for insomnia.  Please see After Visit Summary for patient specific instructions.  Future Appointments  Date Time Provider [place]  02/24/2020  7:00 AM [facility] CONFERENCE [facility] None  02/24/2020  3:30 PM Hayden Pedro, PA-C [facility] None  02/25/2020 11:00 AM Ventura Sellers, MD [facility] None  03/03/2020  8:15 AM [facility] LAB [facility] None  03/03/2020  8:45 AM Curt Bears, MD [facility] None   03/03/2020  9:30 AM [facility] INFUSION [facility] None  03/23/2020 10:00 AM [facility] LAB [facility] None  03/23/2020 10:30 AM Heilingoetter, Cassandra L, PA-C [facility] None  03/23/2020 11:30 AM [facility] INFUSION [facility] None  04/13/2020  9:00 AM [facility] LAB [facility] None  04/13/2020  9:30 AM Curt Bears, MD [facility] None  04/13/2020 10:30 AM [facility] INFUSION [facility] None    No orders of the defined types were placed in this encounter.      Subjective:   Patient ID:  Joseph Atkins is a 59 y.o. (DOB 03-15-1961) male.  Chief Complaint:  Chief Complaint  Patient presents with  . Follow-up    [name]  is a 59 y.o. male with a diagnosis of a metastatic adenocarcinoma of the lung with brain metastasis.  He continues to be followed by Dr. Julien Nordmann and Dr. Mickeal Skinner and presents for consideration of cycle 1, day 1 of Keytruda.  He continues on Decadron 2 mg p.o. twice daily.  He will be seen by Dr. Mickeal Skinner on 02/25/2020 after having an MRI of his brain completed on 02/21/2020.  Dr. Mickeal Skinner will be presenting his case to the tumor board on 02/24/2020.  He presents with his wife today.  She reports that he has having episodes of anxiety and depression.  [name] reports that he is unable to do things that he had previously done independently.  He also reports that it is difficult for others to understand his medical concerns.  He is not sleeping well.  He reports that he typically goes to bed around 830 and then is awake by 11 PM.  His headaches have resolved.  His nausea has improved.  He is eager to proceed with his treatment today.   Medications: I have reviewed the patient's current medications.  Allergies:  Allergies  Allergen Reactions  . Penicillins Other (See Comments)    Childhood allergy.  Has patient had a PCN reaction causing immediate rash, facial/tongue/throat swelling, SOB or lightheadedness with hypotension:  unknown Has patient had a PCN reaction causing severe rash involving mucus membranes or skin necrosis: unknown Has patient had a PCN reaction that required hospitalization: unknown Has patient had a PCN reaction occurring within the last 10 years: no If all of the above answers are "NO", then may proceed with Cephalosporin use.     Past Medical History:  Diagnosis Date  . Cancer ([place])    lung cancer  . Diverticulosis   . GERD (gastroesophageal reflux disease)   . Hx of small bowel obstruction   . Hyperlipidemia   . Hypothyroidism   . IBS (irritable bowel syndrome)   . Substance abuse ([facility])    Alcoholic, Drug addition  . Thyroid disease     Past Surgical History:  Procedure Laterality Date  . arm surgery Right   . BRONCHIAL BRUSHINGS  10/24/2019   Procedure: BRONCHIAL BRUSHINGS;  Surgeon: Collene Gobble, MD;  Location[facility] ENDOSCOPY;  Service: Cardiopulmonary;;  right lower lobe   . BRONCHIAL BRUSHINGS  11/05/2019   Procedure: BRONCHIAL BRUSHINGS;  Surgeon: Collene Gobble, MD;  Location: [facility] ENDOSCOPY;  Service: Pulmonary;;  . BRONCHIAL NEEDLE ASPIRATION BIOPSY  10/24/2019   Procedure: BRONCHIAL NEEDLE ASPIRATION BIOPSIES;  Surgeon: Collene Gobble, MD;  Location: [place];  Service: Cardiopulmonary;;  . BRONCHIAL NEEDLE ASPIRATION BIOPSY  11/05/2019   Procedure: BRONCHIAL NEEDLE ASPIRATION BIOPSIES;  Surgeon: Collene Gobble, MD;  Location: [facility] ENDOSCOPY;  Service: Pulmonary;;  . ENDOBRONCHIAL ULTRASOUND N/A 10/24/2019   Procedure: ENDOBRONCHIAL ULTRASOUND;  Surgeon: Collene Gobble, MD;  Location: [place];  Service: Cardiopulmonary;  Laterality: N/A;  . FINGER SURGERY Right    Middle  . IR IMAGING GUIDED PORT INSERTION  11/19/2019  . VIDEO BRONCHOSCOPY N/A 10/24/2019   Procedure: VIDEO BRONCHOSCOPY WITHOUT FLUORO;  Surgeon: Collene Gobble, MD;  Location: [facility] ENDOSCOPY;  Service: Cardiopulmonary;  Laterality: N/A;  . VIDEO BRONCHOSCOPY WITH ENDOBRONCHIAL NAVIGATION N/A  11/05/2019   Procedure: VIDEO BRONCHOSCOPY WITH ENDOBRONCHIAL NAVIGATION;  Surgeon: Collene Gobble, MD;  Location: [place] ENDOSCOPY;  Service: Pulmonary;  Laterality: N/A;    Family History  Problem Relation Age of Onset  . Epilepsy Mother   . Heart disease Mother   . Heart disease Brother   . Lung cancer Paternal Uncle     Social History   Socioeconomic History  . Marital status: Married    Spouse name: Not on file  . Number of children: 2  . Years of education: Not on file  . Highest education level: Not on file  Occupational History  . Occupation: Buyer, retail: [place]  Tobacco Use  . Smoking status: Current Every Day Smoker    Packs/day: 1.00    Types: Cigarettes  . Smokeless tobacco: Former Systems developer    Types: Secondary school teacher  . Vaping Use: Never used  Substance and Sexual Activity  . Alcohol use: No    Comment: Alcoholic clean for 6 years  . Drug use: No    Comment: Recovering Drug Addict-clean for 6 years  . Sexual activity: Not on file  Other Topics Concern  .  Not on file  Social History Narrative  . Not on file   Social Determinants of Health   Financial Resource Strain: Medium Risk  . Difficulty of Paying Living Expenses: Somewhat hard  Food Insecurity: No Food Insecurity  . Worried About Charity fundraiser in the Last Year: Never true  . Ran Out of Food in the Last Year: Never true  Transportation Needs: No Transportation Needs  . Lack of Transportation (Medical): No  . Lack of Transportation (Non-Medical): No  Physical Activity: Insufficiently Active  . Days of Exercise per Week: 5 days  . Minutes of Exercise per Session: 20 min  Stress: Stress Concern Present  . Feeling of Stress : Very much  Social Connections: Moderately Isolated  . Frequency of Communication with Friends and Family: More than three times a week  . Frequency of Social Gatherings with Friends and Family: More than three times a week  . Attends Religious Services:  Never  . Active Member of Clubs or Organizations: No  . Attends Archivist Meetings: Never  . Marital Status: Married  Human resources officer Violence: Not At Risk  . Fear of Current or Ex-Partner: No  . Emotionally Abused: No  . Physically Abused: No  . Sexually Abused: No    Past Medical History, Surgical history, Social history, and Family history were reviewed and updated as appropriate.   Please see review of systems for further details on the patient's review from today.   Review of Systems:  Review of Systems  Constitutional: Negative for chills, diaphoresis and fever.  HENT: Negative for trouble swallowing and voice change.   Respiratory: Negative for cough, chest tightness, shortness of breath and wheezing.   Cardiovascular: Negative for chest pain and palpitations.  Gastrointestinal: Negative for abdominal pain, constipation, diarrhea, nausea and vomiting.  Musculoskeletal: Negative for back pain and myalgias.  Neurological: Negative for dizziness, light-headedness and headaches.  Psychiatric/Behavioral: Positive for dysphoric mood and sleep disturbance. The patient is nervous/anxious.     Objective:   Physical Exam:  BP 135/88 (BP Location: Left Arm, Patient Position: Sitting)   Pulse 84   Temp (!) 96.8 F (36 C) (Tympanic)   Resp 18   Ht 5\' 8"  (1.727 m)   Wt 176 lb (79.8 kg)   SpO2 100%   BMI 26.76 kg/m  ECOG: 1  Physical Exam Constitutional:      General: He is not in acute distress.    Appearance: He is not diaphoretic.  HENT:     Head: Normocephalic and atraumatic.  Eyes:     General: No scleral icterus.       Right eye: No discharge.        Left eye: No discharge.     Conjunctiva/sclera: Conjunctivae normal.  Cardiovascular:     Rate and Rhythm: Normal rate and regular rhythm.     Heart sounds: Normal heart sounds. No murmur heard.  No friction rub. No gallop.   Pulmonary:     Effort: Pulmonary effort is normal. No respiratory distress.      Breath sounds: Normal breath sounds. No wheezing or rales.  Skin:    General: Skin is warm and dry.     Findings: No erythema or rash.  Neurological:     Mental Status: He is alert.     Coordination: Coordination normal.     Gait: Gait normal.     Lab Review:     Component Value Date/Time   NA 141 02/18/2020 1355  NA 141 03/08/2017 1707   K 4.0 02/18/2020 1355   CL 105 02/18/2020 1355   CO2 26 02/18/2020 1355   GLUCOSE 95 02/18/2020 1355   BUN 8 02/18/2020 1355   BUN 12 03/08/2017 1707   CREATININE 0.75 02/18/2020 1355   CALCIUM 9.3 02/18/2020 1355   PROT 6.7 02/18/2020 1355   PROT 7.1 03/08/2017 1707   ALBUMIN 3.8 02/18/2020 1355   ALBUMIN 4.5 03/08/2017 1707   AST 21 02/18/2020 1355   ALT 50 (H) 02/18/2020 1355   ALKPHOS 65 02/18/2020 1355   BILITOT 0.4 02/18/2020 1355   GFRNONAA >60 02/18/2020 1355   GFRAA >60 01/06/2020 0837       Component Value Date/Time   WBC 8.6 02/18/2020 1355   WBC 17.0 (H) 11/19/2019 1230   RBC 3.32 (L) 02/18/2020 1355   HGB 11.3 (L) 02/18/2020 1355   HGB 14.4 03/08/2017 1707   HCT 35.3 (L) 02/18/2020 1355   HCT 42.2 03/08/2017 1707   PLT 281 02/18/2020 1355   PLT 194 03/08/2017 1707   MCV 106.3 (H) 02/18/2020 1355   MCV 89 03/08/2017 1707   MCH 34.0 02/18/2020 1355   MCHC 32.0 02/18/2020 1355   RDW 17.5 (H) 02/18/2020 1355   RDW 13.1 03/08/2017 1707   LYMPHSABS 1.0 02/18/2020 1355   LYMPHSABS 3.0 03/08/2017 1707   MONOABS 0.7 02/18/2020 1355   EOSABS 0.0 02/18/2020 1355   EOSABS 0.2 03/08/2017 1707   BASOSABS 0.0 02/18/2020 1355   BASOSABS 0.1 03/08/2017 1707   -------------------------------  Imaging from last 24 hours (if applicable):  Radiology interpretation: CT Chest W Contrast  Result Date: 01/31/2020 CLINICAL DATA:  Non-small cell lung cancer. Assess treatment response. EXAM: CT CHEST WITH CONTRAST TECHNIQUE: Multidetector CT imaging of the chest was performed during intravenous contrast administration.  CONTRAST:  2mL OMNIPAQUE IOHEXOL 300 MG/ML  SOLN COMPARISON:  CT 11/01/2019, PET-CT 7211 FINDINGS: Cardiovascular: No significant vascular findings. Normal heart size. No pericardial effusion. Mediastinum/Nodes: Port in the anterior chest wall with tip in distal SVC. No axillary or supraclavicular adenopathy. No mediastinal or hilar adenopathy. No pericardial fluid. Esophagus normal. Lungs/Pleura: Interval decrease in volume of the RIGHT infrahilar mass which measures 3.3 x 2.4 cm in axial dimension compared to 5.4 x 4.3 cm on CT 11/01/2019. Upper Abdomen: Limited view of the liver, kidneys, pancreas are unremarkable. Normal adrenal glands. Low-density lesion in the LEFT hepatic lobe measuring 13 mm is unchanged and was not hypermetabolic on comparison PET-CT scan. Additionally lesion not changed from CT 06/15/2016. Musculoskeletal: No acute osseous abnormality. IMPRESSION: 1. Interval decrease in volume of RIGHT infrahilar mass. 2. No evidence of metastatic mediastinal lymphadenopathy. 3. No new or progressive disease. 4. Benign LEFT hepatic lobe hypodensity. Electronically Signed   By: Suzy Bouchard M.D.   On: 01/31/2020 10:00   MR BRAIN W WO CONTRAST  Result Date: 02/21/2020 CLINICAL DATA:  Brain/CNS neoplasm, assess treatment response. EXAM: MRI HEAD WITHOUT AND WITH CONTRAST TECHNIQUE: Multiplanar, multiecho pulse sequences of the brain and surrounding structures were obtained without and with intravenous contrast. CONTRAST:  66mL MULTIHANCE GADOBENATE DIMEGLUMINE 529 MG/ML IV SOLN COMPARISON:  MRI of the brain November 07, 2019. FINDINGS: Brain: No acute infarction, hemorrhage, hydrocephalus or extra-axial collection. Small amount of scattered foci of T2 hyperintensity within the white matter of the cerebral hemispheres, nonspecific. Necrotic lesion with rim irregular rim enhancement within the left cerebellar hemisphere appear mildly enlarged compared to prior, measuring approximately 27 x 24 x 18 mm (24  x 23 x 18 mm on prior). There is interval development of contrast enhancement along the cerebellar sulci, consistent with leptomeningeal spread. Prominent surrounding vasogenic edema involving the left cerebellar hemisphere, vermis and left middle cerebellar peduncle with mass effect and rightward deviation of the fourth ventricle, cerebellar vermis and left superior cerebellar peduncle. There is effacement of the left cerebellopontine angle at the level of the left porus acusticus. Significant interval increase in size of a left posterior frontal enhancing lesion involving the precentral gyrus measuring approximately 9 x 9 mm (1 mm on prior) now with surrounding vasogenic edema. Interval development of multiple new enhancing lesions, labeled on series 11: *Right precentral gyrus, 7 mm, image 125; *Right thalamus, 3 mm, image 89; *Left occipital lobe, 6 mm, image 91; *Left occipital lobe, 2 mm, image 75; *Left occipital lobe, 5 mm, image 72; *Right temporal lobe, 2 mm, image 72. Vascular: Normal flow voids. Skull and upper cervical spine: Degenerative changes of the cervical spine. Otherwise, normal marrow signal. Sinuses/Orbits: Mucous retention cyst within the right maxillary sinus. The orbits are maintained. Other: None. IMPRESSION: 1. Mild increase in size of the left cerebellar hemisphere necrotic lesion with interval development of leptomeningeal spread of disease. Stable associated vasogenic edema and mass effect on the fourth ventricle. No hydrocephalus. 2. Significant interval increase in size of a left posterior frontal enhancing lesion measuring 9 mm (1 mm on prior) now with surrounding vasogenic edema. 3. Interval development of multiple new enhancing lesions, as described above. These results will be called to the ordering clinician or representative by the Radiologist Assistant, and communication documented in the PACS or [employer]. Electronically Signed   By: Pedro Earls M.D.    On: 02/21/2020 11:26        This case was discussed with Dr. Julien Nordmann. He expressed agreement with my management of this patient.

## 2020-02-24 ENCOUNTER — Ambulatory Visit
Admission: RE | Admit: 2020-02-24 | Discharge: 2020-02-24 | Disposition: A | Discharge status: Home / Self Care | Payer: Managed Care, Other (non HMO) | Source: Ambulatory Visit | Attending: Radiation Oncology | Admitting: Radiation Oncology

## 2020-02-24 DIAGNOSIS — C7931 Secondary malignant neoplasm of brain: Secondary | ICD-10-CM

## 2020-02-24 DIAGNOSIS — C3490 Malignant neoplasm of unspecified part of unspecified bronchus or lung: Secondary | ICD-10-CM

## 2020-02-24 NOTE — Progress Notes (Signed)
Radiation Oncology         [phone] ________________________________  Initial Outpatient Consultation - Conducted via telephone due to current COVID-19 concerns for limiting patient exposure  I spoke with the patient to conduct this consult visit via telephone to spare the patient unnecessary potential exposure in the healthcare setting during the current COVID-19 pandemic. The patient was notified in advance and was offered a [place] meeting to allow for face to face communication but unfortunately reported that they did not have the appropriate resources/technology to support such a visit and instead preferred to proceed with a telephone consult.   Name: Joseph Atkins MRN: [id number]  Date of Service: 02/24/2020  DOB: 1961-02-21  Follow Up   Diagnosis:  Stage IV, cT3, N0, M1c, non-small cell lung cancer, adenocarcinoma of the right lower lobe  Interval Since Last Radiation:  2 months  11/25/19 - 01/09/20: The patient was treated to the disease within the right lung initially to a dose of 60 Gy using a 4 field, 3-D conformal technique. The patient then received a cone down boost treatment for an additional 6 Gy. This yielded a final total dose of 66 Gy.   11/14/19 SRS Treatment:  PTV1 Lt Cerebellum 28 mm, 18 Gy in 1 fraction  Narrative: This is a pleasant 59 y.o. male with a history of stage IV non-small cell lung cancer, adenocarcinoma arising in the right lower lobe with what was originally a solitary brain metastasis.  He was treated with stereotactic radiosurgery [facility]) in July 2021, and definitive chemoradiation to the lung which he completed in September 2021.  He has been receiving consolidative Keytruda with Dr. Julien Nordmann.  He has had a difficult time with trying to taper his steroids with what resulted in rebound edema following the tapering or discontinuing certain doses, he seems to have rebound edema and symptoms of this every time we would try and taper him below dexamethasone 2  mg/day.  He went for his MRI on Friday of last week, and there was surrounding vasogenic edema, that while there was concern for possible leptomeningeal disease, this was not felt to be of great concern given the inflammatory changes that could continue to happen.  We also noted an increase in the left posterior frontal lesion measuring 9 x 9 mm with surrounding vasogenic edema, and a 7 mm right precentral dry gyrus lesion, 3 mm right thalamic lesion, 6 mm, 2 mm, and 5 mm left occipital lobe lesions, and a 2 mm right temporal lobe lesion.  He reviewed his MRI scan with the symptom management clinicians and was discussed with Dr. Mickeal Skinner as well.  It was recommended that he go back to dexamethasone 4 mg twice daily which she resumed and has since been feeling much better.  He is contacted today to review the discussion that was had in brain oncology conference which includes proceeding with stereotactic radiosurgery to the 7 lesions seen on MRI scan.  He plans to see Dr. Mickeal Skinner tomorrow for steroid management.  He has already been scheduled for simulation as well tomorrow by our navigator.   On review of systems the patient reports that he has been feeling a lot better since going back onto the 4 mg dosing of steroids, he feels like his headaches and his nausea have improved quite a lot.  He is still having some weakness, and at times dizziness.  He is having hard time sleeping, and also describes swelling of his face.  He is concerned about being  able to fit into his mask for simulation.  No other complaints are verbalized.   PAST MEDICAL HISTORY:  Past Medical History:  Diagnosis Date  . Cancer ([place])    lung cancer  . Diverticulosis   . GERD (gastroesophageal reflux disease)   . Hx of small bowel obstruction   . Hyperlipidemia   . Hypothyroidism   . IBS (irritable bowel syndrome)   . Substance abuse ([facility])    Alcoholic, Drug addition  . Thyroid disease     PAST SURGICAL HISTORY: Past Surgical  History:  Procedure Laterality Date  . arm surgery Right   . BRONCHIAL BRUSHINGS  10/24/2019   Procedure: BRONCHIAL BRUSHINGS;  Surgeon: Collene Gobble, MD;  Location: [facility] ENDOSCOPY;  Service: Cardiopulmonary;;  right lower lobe   . BRONCHIAL BRUSHINGS  11/05/2019   Procedure: BRONCHIAL BRUSHINGS;  Surgeon: Collene Gobble, MD;  Location: [facility] ENDOSCOPY;  Service: Pulmonary;;  . BRONCHIAL NEEDLE ASPIRATION BIOPSY  10/24/2019   Procedure: BRONCHIAL NEEDLE ASPIRATION BIOPSIES;  Surgeon: Collene Gobble, MD;  Location: [place];  Service: Cardiopulmonary;;  . BRONCHIAL NEEDLE ASPIRATION BIOPSY  11/05/2019   Procedure: BRONCHIAL NEEDLE ASPIRATION BIOPSIES;  Surgeon: Collene Gobble, MD;  Location: [facility] ENDOSCOPY;  Service: Pulmonary;;  . ENDOBRONCHIAL ULTRASOUND N/A 10/24/2019   Procedure: ENDOBRONCHIAL ULTRASOUND;  Surgeon: Collene Gobble, MD;  Location: [place];  Service: Cardiopulmonary;  Laterality: N/A;  . FINGER SURGERY Right    Middle  . IR IMAGING GUIDED PORT INSERTION  11/19/2019  . VIDEO BRONCHOSCOPY N/A 10/24/2019   Procedure: VIDEO BRONCHOSCOPY WITHOUT FLUORO;  Surgeon: Collene Gobble, MD;  Location[facility] ENDOSCOPY;  Service: Cardiopulmonary;  Laterality: N/A;  . VIDEO BRONCHOSCOPY WITH ENDOBRONCHIAL NAVIGATION N/A 11/05/2019   Procedure: VIDEO BRONCHOSCOPY WITH ENDOBRONCHIAL NAVIGATION;  Surgeon: Collene Gobble, MD;  Location: [place] ENDOSCOPY;  Service: Pulmonary;  Laterality: N/A;    PAST SOCIAL HISTORY:  Social History   Socioeconomic History  . Marital status: Married    Spouse name: Not on file  . Number of children: 2  . Years of education: Not on file  . Highest education level: Not on file  Occupational History  . Occupation: Buyer, retail: [employer]  Tobacco Use  . Smoking status: Current Every Day Smoker    Packs/day: 1.00    Types: Cigarettes  . Smokeless tobacco: Former Systems developer    Types: Secondary school teacher  . Vaping Use: Never used  Substance and  Sexual Activity  . Alcohol use: No    Comment: Alcoholic clean for 6 years  . Drug use: No    Comment: Recovering Drug Addict-clean for 6 years  . Sexual activity: Not on file  Other Topics Concern  . Not on file  Social History Narrative  . Not on file   Social Determinants of Health   Financial Resource Strain: Medium Risk  . Difficulty of Paying Living Expenses: Somewhat hard  Food Insecurity: No Food Insecurity  . Worried About Charity fundraiser in the Last Year: Never true  . Ran Out of Food in the Last Year: Never true  Transportation Needs: No Transportation Needs  . Lack of Transportation (Medical): No  . Lack of Transportation (Non-Medical): No  Physical Activity: Insufficiently Active  . Days of Exercise per Week: 5 days  . Minutes of Exercise per Session: 20 min  Stress: Stress Concern Present  . Feeling of Stress : Very much  Social Connections: Moderately Isolated  .  Frequency of Communication with Friends and Family: More than three times a week  . Frequency of Social Gatherings with Friends and Family: More than three times a week  . Attends Religious Services: Never  . Active Member of Clubs or Organizations: No  . Attends Archivist Meetings: Never  . Marital Status: Married  Human resources officer Violence: Not At Risk  . Fear of Current or Ex-Partner: No  . Emotionally Abused: No  . Physically Abused: No  . Sexually Abused: No    PAST FAMILY HISTORY: Family History  Problem Relation Age of Onset  . Epilepsy Mother   . Heart disease Mother   . Heart disease Brother   . Lung cancer Paternal Uncle     MEDICATIONS  Current Outpatient Medications  Medication Sig Dispense Refill  . albuterol (PROVENTIL HFA;VENTOLIN HFA) 108 (90 Base) MCG/ACT inhaler Inhale 2 puffs into the lungs every 6 (six) hours as needed for wheezing or shortness of breath. 1 Inhaler 0  . ANORO ELLIPTA 62.5-25 MCG/INH AEPB 1 puff daily.    Marland Kitchen docusate sodium (COLACE) 100 MG  capsule Take 100 mg by mouth daily.    . DULoxetine (CYMBALTA) 20 MG capsule Take 1 capsule (20 mg total) by mouth 2 (two) times daily. 60 capsule 5  . fluticasone furoate-vilanterol (BREO ELLIPTA) 100-25 MCG/INH AEPB Inhale 1 puff into the lungs daily. 30 each 3  . levothyroxine (SYNTHROID) 75 MCG tablet Take 75 mcg by mouth daily.    Marland Kitchen omeprazole (PRILOSEC) 40 MG capsule Take 1 capsule (40 mg total) by mouth daily. 90 capsule 4  . polyethylene glycol powder (MIRALAX) powder Take 17 g by mouth daily. 255 g 11  . TRULANCE 3 MG TABS Take 1 tablet by mouth daily.    Marland Kitchen zolpidem (AMBIEN CR) 12.5 MG CR tablet Take 1 tablet (12.5 mg total) by mouth at bedtime as needed for sleep. 30 tablet 5  . cyclobenzaprine (FLEXERIL) 10 MG tablet Take 10 mg by mouth 2 (two) times daily as needed for muscle spasms. (Patient not taking: Reported on 02/03/2020)    . dexamethasone (DECADRON) 2 MG tablet Take 2 tablets (4 mg total) by mouth 2 (two) times daily. 120 tablet 2  . lidocaine (XYLOCAINE) 2 % solution Use as directed 15 mLs in the mouth or throat every 6 (six) hours as needed for mouth pain. Do not eat/drink within 60 minutes of taking this medication (Patient not taking: Reported on 02/20/2020) 100 mL 0  . lidocaine-prilocaine (EMLA) cream Apply 1 application topically as needed. (Patient not taking: Reported on 02/20/2020) 30 g 0  . ondansetron (ZOFRAN) 4 MG tablet Take 1 tablet (4 mg total) by mouth every 8 (eight) hours as needed. 40 tablet 2  . oxyCODONE-acetaminophen (PERCOCET/ROXICET) 5-325 MG tablet Take 1 tablet by mouth every 4 (four) hours as needed for severe pain. (Patient not taking: Reported on 12/16/2019) 30 tablet 0  . prochlorperazine (COMPAZINE) 10 MG tablet Take 1 tablet (10 mg total) by mouth every 6 (six) hours as needed. 30 tablet 2  . sucralfate (CARAFATE) 1 g tablet Take 1 tablet (1 g total) by mouth 4 (four) times daily. Dissolve each tablet in 15 cc water before use. (Patient not taking:  Reported on 02/20/2020) 120 tablet 2   No current facility-administered medications for this encounter.    ALLERGIES:  Allergies  Allergen Reactions  . Penicillins Other (See Comments)    Childhood allergy.  Has patient had a PCN reaction causing immediate  rash, facial/tongue/throat swelling, SOB or lightheadedness with hypotension: unknown Has patient had a PCN reaction causing severe rash involving mucus membranes or skin necrosis: unknown Has patient had a PCN reaction that required hospitalization: unknown Has patient had a PCN reaction occurring within the last 10 years: no If all of the above answers are "NO", then may proceed with Cephalosporin use.    Physical exam: Unable to assess given encounter type.  Impression/Plan: 1. Stage IV, cT3, N0, M1c, non-small cell lung cancer, adenocarcinoma of the right lower lobe with progressive brain disease.  I spent time with the patient today reviewing our discussion for multidisciplinary oncology conference, Dr. Mickeal Skinner and Dr. Lisbeth Renshaw have waiting on his case and both recommend proceeding with stereotactic radiosurgery to the 7 lesions seen on his recent MRI scan.  We reviewed the risks, benefits, short and long-term effects of radiotherapy as well as the plan to continue with steroid management under the care of Dr. Mickeal Skinner.  The patient will also continue under the care of Dr. Julien Nordmann with his systemic Keytruda. 2. Vasogenic edema around the primary cerebellar metastasis.  This continues to be difficult to manage, despite multiple attempts at tapering his steroids, he met back again with Dr. Mickeal Skinner, and is currently taking dexamethasone 4 mg twice daily.  He is aware of the long-term risks and side effects, and will plan to see Dr. Mickeal Skinner for further management of this tomorrow.  We will follow this expectantly.  Given current concerns for patient exposure during the COVID-19 pandemic, this encounter was conducted via telephone.  The patient  has provided two factor identification and has given verbal consent for this type of encounter and has been advised to only accept a meeting of this type in a secure network environment. The time spent during this encounter was 40 minutes including preparation, discussion, and coordination of the patient's care. The attendants for this meeting include Hayden Pedro  and RAMSAY BOGNAR and his wife Lyrik Buresh.   Hayden Pedro was located at [facility] Radiation Oncology Department.  Lori Liew and his wife Vaughan Basta were located at home    Carola Rhine, [facility]

## 2020-02-25 ENCOUNTER — Other Ambulatory Visit: Payer: Self-pay

## 2020-02-25 ENCOUNTER — Inpatient Hospital Stay: Payer: Managed Care, Other (non HMO) | Attending: Radiation Oncology | Admitting: Internal Medicine

## 2020-02-25 ENCOUNTER — Inpatient Hospital Stay: Payer: Managed Care, Other (non HMO) | Admitting: *Deleted

## 2020-02-25 ENCOUNTER — Ambulatory Visit
Admission: RE | Admit: 2020-02-25 | Discharge: 2020-02-25 | Disposition: A | Discharge status: Home / Self Care | Payer: Managed Care, Other (non HMO) | Source: Ambulatory Visit | Attending: Radiation Oncology | Admitting: Radiation Oncology

## 2020-02-25 VITALS — BP 124/71 | HR 89 | Temp 97.8°F | Resp 18 | Ht 68.0 in | Wt 176.0 lb

## 2020-02-25 VITALS — BP 128/80 | HR 77 | Temp 98.1°F | Resp 18 | Ht 68.0 in | Wt 173.9 lb

## 2020-02-25 DIAGNOSIS — R5383 Other fatigue: Secondary | ICD-10-CM | POA: Diagnosis not present

## 2020-02-25 DIAGNOSIS — C7931 Secondary malignant neoplasm of brain: Secondary | ICD-10-CM

## 2020-02-25 DIAGNOSIS — F1010 Alcohol abuse, uncomplicated: Secondary | ICD-10-CM | POA: Diagnosis not present

## 2020-02-25 DIAGNOSIS — E039 Hypothyroidism, unspecified: Secondary | ICD-10-CM | POA: Diagnosis not present

## 2020-02-25 DIAGNOSIS — C3412 Malignant neoplasm of upper lobe, left bronchus or lung: Secondary | ICD-10-CM | POA: Insufficient documentation

## 2020-02-25 DIAGNOSIS — R531 Weakness: Secondary | ICD-10-CM | POA: Diagnosis not present

## 2020-02-25 DIAGNOSIS — C3491 Malignant neoplasm of unspecified part of right bronchus or lung: Secondary | ICD-10-CM

## 2020-02-25 DIAGNOSIS — F17210 Nicotine dependence, cigarettes, uncomplicated: Secondary | ICD-10-CM | POA: Insufficient documentation

## 2020-02-25 DIAGNOSIS — Z510 Encounter for antineoplastic radiation therapy: Secondary | ICD-10-CM | POA: Insufficient documentation

## 2020-02-25 DIAGNOSIS — E785 Hyperlipidemia, unspecified: Secondary | ICD-10-CM | POA: Insufficient documentation

## 2020-02-25 DIAGNOSIS — K219 Gastro-esophageal reflux disease without esophagitis: Secondary | ICD-10-CM | POA: Insufficient documentation

## 2020-02-25 DIAGNOSIS — Z79899 Other long term (current) drug therapy: Secondary | ICD-10-CM | POA: Diagnosis not present

## 2020-02-25 DIAGNOSIS — Z5111 Encounter for antineoplastic chemotherapy: Secondary | ICD-10-CM | POA: Insufficient documentation

## 2020-02-25 DIAGNOSIS — K589 Irritable bowel syndrome without diarrhea: Secondary | ICD-10-CM | POA: Diagnosis not present

## 2020-02-25 MED ORDER — HEPARIN SOD (PORK) LOCK FLUSH 100 UNIT/ML IV SOLN
500.0000 [IU] | Freq: Once | INTRAVENOUS | Status: AC
  Administered 2020-02-25: 500 [IU] via INTRAVENOUS

## 2020-02-25 MED ORDER — SODIUM CHLORIDE 0.9% FLUSH
10.0000 mL | Freq: Once | INTRAVENOUS | Status: AC
  Administered 2020-02-25: 10 mL via INTRAVENOUS

## 2020-02-25 NOTE — Progress Notes (Signed)
Has armband been applied?  Yes  Does patient have an allergy to IV contrast dye?: No   Has patient ever received premedication for IV contrast dye?: n/a  Does patient take metformin?: No  If patient does take metformin when was the last dose: n/a   Date of lab work: 02/18/2020 BUN: 8 CR: 0.75 eGfr: >60  IV site: Right Chest port  Has IV site been added to flowsheet?  Yes  Taking Decadron 4 mg BID

## 2020-02-25 NOTE — Progress Notes (Signed)
[place] Work  Clinical Social Work met with patient and patients spouse in [place] office at [facility] to discuss disability questions and concerns.  Patient stated he recently transitioned to his long tern disability (LTD) benefit through his employer, and has received 1 payment.  Patient received a letter and phone call from his LTD provider that stated he needed to apply for social security disability (SSDI).  CSW, patient and patients wife reviewed the letter and wrote down questions for the contact provided in the letter.  Patient plans to contact LTD provider with questions and will proceed as needed.  CSW provided contact information and encouraged patient to call with questions or concerns.          Johnnye Lana, MSW, LCSW, OSW-C Clinical Social Worker [facility] [phone]

## 2020-02-25 NOTE — Progress Notes (Signed)
[facility] [place], [place] [address] [phone]   Interval Evaluation  Date of Service: 02/25/20 Patient Name: Joseph Atkins Patient MRN: [id number] Patient DOB: [birth year] Provider: Ventura Sellers, MD  Identifying Statement:  Joseph Atkins is a 59 y.o. male with Brain metastasis ([place]) [C79.31]   Primary Cancer:  Oncologic History: Oncology History  Adenocarcinoma of right lung, stage 4 ([place])  11/14/2019 Initial Diagnosis   Adenocarcinoma of right lung, stage 4 ([place])   11/25/2019 - 01/06/2020 Chemotherapy   The patient had palonosetron (ALOXI) injection 0.25 mg, 0.25 mg, Intravenous,  Once, 7 of 7 cycles Administration: 0.25 mg (11/25/2019), 0.25 mg (12/23/2019), 0.25 mg (12/31/2019), 0.25 mg (12/02/2019), 0.25 mg (01/06/2020), 0.25 mg (12/09/2019), 0.25 mg (12/16/2019) CARBOplatin (PARAPLATIN) 250 mg in sodium chloride 0.9 % 250 mL chemo infusion, 249.8 mg (100 % of original dose 249.8 mg), Intravenous,  Once, 7 of 7 cycles Dose modification: 249.8 mg (original dose 249.8 mg, Cycle 1) Administration: 250 mg (11/25/2019), 250 mg (12/23/2019), 250 mg (12/31/2019), 250 mg (12/02/2019), 250 mg (01/06/2020), 270 mg (12/09/2019), 250 mg (12/16/2019) PACLitaxel (TAXOL) 90 mg in sodium chloride 0.9 % 250 mL chemo infusion (</= 80mg /m2), 45 mg/m2 = 90 mg, Intravenous,  Once, 7 of 7 cycles Administration: 90 mg (11/25/2019), 90 mg (12/23/2019), 90 mg (12/31/2019), 90 mg (12/02/2019), 90 mg (01/06/2020), 90 mg (12/09/2019), 90 mg (12/16/2019)  for chemotherapy treatment.    02/18/2020 -  Chemotherapy   The patient had pembrolizumab (KEYTRUDA) 200 mg in sodium chloride 0.9 % 50 mL chemo infusion, 200 mg, Intravenous, Once, 1 of 6 cycles Administration: 200 mg (02/18/2020)  for chemotherapy treatment.     11/14/19: Completes single frx SRS to 2.4cm cerebellar metastasis  Interval History:  BRYCEN Atkins presents today following recent MRI brain.  He  describes clinical decline over the past week, described as "nausea, vomiting" and "worsening balance issues".  This improved dramatically starting over the weekend when decadron was restarted at 4mg  twice per day.  Currently he is walking with a cane, although he gets around at home without it.  No other issues aside from chronic right hand dysfunction.  He does complain of ongoing fatigue and low energy, not worse from prior. Continues on Keytruda therapy.  H+P (01/24/20) Patient presents today to discuss recent recurrence of neurologic symptoms following radiosurgery in July 2021.  He describes poor balance, wide based walking, needing to hold on to family or objects to walk safely.  He also describes nausea and dizzy-headed feeling at times.  Overall he is functioning in an impaired and sluggish way.  Symptoms have become noticeable since decreasing decadron to less than 4mg  daily; currently he is dosing 2mg  daily.  He felt "quite good" immediately after radiation and during higher dose steroid therapy.  No complications from decadron that he can report.  Recently completed chemoradioatherapy induction for lung adenocarcinoma with Dr. Julien Nordmann.  Medications: Current Outpatient Medications on File Prior to Visit  Medication Sig Dispense Refill  . albuterol (PROVENTIL HFA;VENTOLIN HFA) 108 (90 Base) MCG/ACT inhaler Inhale 2 puffs into the lungs every 6 (six) hours as needed for wheezing or shortness of breath. 1 Inhaler 0  . ANORO ELLIPTA 62.5-25 MCG/INH AEPB 1 puff daily.    Marland Kitchen dexamethasone (DECADRON) 2 MG tablet Take 2 tablets (4 mg total) by mouth 2 (two) times daily. 120 tablet 2  . docusate sodium (COLACE) 100 MG capsule Take 100 mg  by mouth daily.    . DULoxetine (CYMBALTA) 20 MG capsule Take 1 capsule (20 mg total) by mouth 2 (two) times daily. 60 capsule 5  . fluticasone furoate-vilanterol (BREO ELLIPTA) 100-25 MCG/INH AEPB Inhale 1 puff into the lungs daily. 30 each 3  . levothyroxine  (SYNTHROID) 75 MCG tablet Take 75 mcg by mouth daily.    Marland Kitchen lidocaine-prilocaine (EMLA) cream Apply 1 application topically as needed. 30 g 0  . omeprazole (PRILOSEC) 40 MG capsule Take 1 capsule (40 mg total) by mouth daily. 90 capsule 4  . ondansetron (ZOFRAN) 4 MG tablet Take 1 tablet (4 mg total) by mouth every 8 (eight) hours as needed. 40 tablet 2  . oxyCODONE-acetaminophen (PERCOCET/ROXICET) 5-325 MG tablet Take 1 tablet by mouth every 4 (four) hours as needed for severe pain. 30 tablet 0  . polyethylene glycol powder (MIRALAX) powder Take 17 g by mouth daily. 255 g 11  . prochlorperazine (COMPAZINE) 10 MG tablet Take 1 tablet (10 mg total) by mouth every 6 (six) hours as needed. 30 tablet 2  . sucralfate (CARAFATE) 1 g tablet Take 1 tablet (1 g total) by mouth 4 (four) times daily. Dissolve each tablet in 15 cc water before use. 120 tablet 2  . TRULANCE 3 MG TABS Take 1 tablet by mouth daily.    Marland Kitchen zolpidem (AMBIEN CR) 12.5 MG CR tablet Take 1 tablet (12.5 mg total) by mouth at bedtime as needed for sleep. 30 tablet 5  . cyclobenzaprine (FLEXERIL) 10 MG tablet Take 10 mg by mouth 2 (two) times daily as needed for muscle spasms. (Patient not taking: Reported on 02/03/2020)    . lidocaine (XYLOCAINE) 2 % solution Use as directed 15 mLs in the mouth or throat every 6 (six) hours as needed for mouth pain. Do not eat/drink within 60 minutes of taking this medication (Patient not taking: Reported on 02/20/2020) 100 mL 0   No current facility-administered medications on file prior to visit.    Allergies:  Allergies  Allergen Reactions  . Penicillins Other (See Comments)    Childhood allergy.  Has patient had a PCN reaction causing immediate rash, facial/tongue/throat swelling, SOB or lightheadedness with hypotension: unknown Has patient had a PCN reaction causing severe rash involving mucus membranes or skin necrosis: unknown Has patient had a PCN reaction that required hospitalization:  unknown Has patient had a PCN reaction occurring within the last 10 years: no If all of the above answers are "NO", then may proceed with Cephalosporin use.    Past Medical History:  Past Medical History:  Diagnosis Date  . Cancer ([place])    lung cancer  . Diverticulosis   . GERD (gastroesophageal reflux disease)   . Hx of small bowel obstruction   . Hyperlipidemia   . Hypothyroidism   . IBS (irritable bowel syndrome)   . Substance abuse ([facility])    Alcoholic, Drug addition  . Thyroid disease    Past Surgical History:  Past Surgical History:  Procedure Laterality Date  . arm surgery Right   . BRONCHIAL BRUSHINGS  10/24/2019   Procedure: BRONCHIAL BRUSHINGS;  Surgeon: Collene Gobble, MD;  Location: [facility] ENDOSCOPY;  Service: Cardiopulmonary;;  right lower lobe   . BRONCHIAL BRUSHINGS  11/05/2019   Procedure: BRONCHIAL BRUSHINGS;  Surgeon: Collene Gobble, MD;  Location: [facility] ENDOSCOPY;  Service: Pulmonary;;  . BRONCHIAL NEEDLE ASPIRATION BIOPSY  10/24/2019   Procedure: BRONCHIAL NEEDLE ASPIRATION BIOPSIES;  Surgeon: Collene Gobble, MD;  Location: [place];  Service: Cardiopulmonary;;  . BRONCHIAL NEEDLE ASPIRATION BIOPSY  11/05/2019   Procedure: BRONCHIAL NEEDLE ASPIRATION BIOPSIES;  Surgeon: Collene Gobble, MD;  Location: [facility] ENDOSCOPY;  Service: Pulmonary;;  . ENDOBRONCHIAL ULTRASOUND N/A 10/24/2019   Procedure: ENDOBRONCHIAL ULTRASOUND;  Surgeon: Collene Gobble, MD;  Location: [place];  Service: Cardiopulmonary;  Laterality: N/A;  . FINGER SURGERY Right    Middle  . IR IMAGING GUIDED PORT INSERTION  11/19/2019  . VIDEO BRONCHOSCOPY N/A 10/24/2019   Procedure: VIDEO BRONCHOSCOPY WITHOUT FLUORO;  Surgeon: Collene Gobble, MD;  Location: [facility] ENDOSCOPY;  Service: Cardiopulmonary;  Laterality: N/A;  . VIDEO BRONCHOSCOPY WITH ENDOBRONCHIAL NAVIGATION N/A 11/05/2019   Procedure: VIDEO BRONCHOSCOPY WITH ENDOBRONCHIAL NAVIGATION;  Surgeon: Collene Gobble, MD;  Location: [place] ENDOSCOPY;   Service: Pulmonary;  Laterality: N/A;   Social History:  Social History   Socioeconomic History  . Marital status: Married    Spouse name: Not on file  . Number of children: 2  . Years of education: Not on file  . Highest education level: Not on file  Occupational History  . Occupation: Buyer, retail: [employer]  Tobacco Use  . Smoking status: Current Every Day Smoker    Packs/day: 1.00    Types: Cigarettes  . Smokeless tobacco: Former Systems developer    Types: Secondary school teacher  . Vaping Use: Never used  Substance and Sexual Activity  . Alcohol use: No    Comment: Alcoholic clean for 6 years  . Drug use: No    Comment: Recovering Drug Addict-clean for 6 years  . Sexual activity: Not on file  Other Topics Concern  . Not on file  Social History Narrative  . Not on file   Social Determinants of Health   Financial Resource Strain: Medium Risk  . Difficulty of Paying Living Expenses: Somewhat hard  Food Insecurity: No Food Insecurity  . Worried About Charity fundraiser in the Last Year: Never true  . Ran Out of Food in the Last Year: Never true  Transportation Needs: No Transportation Needs  . Lack of Transportation (Medical): No  . Lack of Transportation (Non-Medical): No  Physical Activity: Insufficiently Active  . Days of Exercise per Week: 5 days  . Minutes of Exercise per Session: 20 min  Stress: Stress Concern Present  . Feeling of Stress : Very much  Social Connections: Moderately Isolated  . Frequency of Communication with Friends and Family: More than three times a week  . Frequency of Social Gatherings with Friends and Family: More than three times a week  . Attends Religious Services: Never  . Active Member of Clubs or Organizations: No  . Attends Archivist Meetings: Never  . Marital Status: Married  Human resources officer Violence: Not At Risk  . Fear of Current or Ex-Partner: No  . Emotionally Abused: No  . Physically Abused: No  .  Sexually Abused: No   Family History:  Family History  Problem Relation Age of Onset  . Epilepsy Mother   . Heart disease Mother   . Heart disease Brother   . Lung cancer Paternal Uncle     Review of Systems: Constitutional: Doesn't report fevers, chills or abnormal weight loss Eyes: Doesn't report blurriness of vision Ears, nose, mouth, throat, and face: Doesn't report sore throat Respiratory: Doesn't report cough, dyspnea or wheezes Cardiovascular: Doesn't report palpitation, chest discomfort  Gastrointestinal:  Doesn't report nausea, constipation, diarrhea GU: Doesn't report incontinence Skin: Doesn't report skin rashes  Neurological: Per HPI Musculoskeletal: Doesn't report joint pain Behavioral/Psych: Doesn't report anxiety  Physical Exam: Vitals:   02/25/20 1032  BP: 128/80  Pulse: 77  Resp: 18  Temp: 98.1 F (36.7 C)  SpO2: 100%   KPS: 70. General: Alert, cooperative, pleasant, in no acute distress Head: Normal EENT: No conjunctival injection or scleral icterus.  Lungs: Resp effort normal Cardiac: Regular rate Abdomen: Non-distended abdomen Skin: No rashes cyanosis or petechiae. Extremities: No clubbing or edema  Neurologic Exam: Mental Status: Awake, alert, attentive to examiner. Oriented to self and environment. Language is fluent with intact comprehension.  Cranial Nerves: Visual acuity is grossly normal. Visual fields are full. Extra-ocular movements intact. No ptosis. Face is symmetric Motor: Tone and bulk are normal. Power is full in both arms and legs. Reflexes are symmetric, no pathologic reflexes present.  Dysmetria L>R Sensory: Intact to light touch Gait: Dystaxic, wide based   Labs: I have reviewed the data as listed    Component Value Date/Time   NA 141 02/18/2020 1355   NA 141 03/08/2017 1707   K 4.0 02/18/2020 1355   CL 105 02/18/2020 1355   CO2 26 02/18/2020 1355   GLUCOSE 95 02/18/2020 1355   BUN 8 02/18/2020 1355   BUN 12 03/08/2017  1707   CREATININE 0.75 02/18/2020 1355   CALCIUM 9.3 02/18/2020 1355   PROT 6.7 02/18/2020 1355   PROT 7.1 03/08/2017 1707   ALBUMIN 3.8 02/18/2020 1355   ALBUMIN 4.5 03/08/2017 1707   AST 21 02/18/2020 1355   ALT 50 (H) 02/18/2020 1355   ALKPHOS 65 02/18/2020 1355   BILITOT 0.4 02/18/2020 1355   GFRNONAA >60 02/18/2020 1355   GFRAA >60 01/06/2020 0837   Lab Results  Component Value Date   WBC 8.6 02/18/2020   NEUTROABS 6.4 02/18/2020   HGB 11.3 (L) 02/18/2020   HCT 35.3 (L) 02/18/2020   MCV 106.3 (H) 02/18/2020   PLT 281 02/18/2020    Imaging:  [place] Clinician Interpretation: I have personally reviewed the CNS images as listed.  My interpretation, in the context of the patient's clinical presentation, is progressive disease  CT Chest W Contrast  Result Date: 01/31/2020 CLINICAL DATA:  Non-small cell lung cancer. Assess treatment response. EXAM: CT CHEST WITH CONTRAST TECHNIQUE: Multidetector CT imaging of the chest was performed during intravenous contrast administration. CONTRAST:  63mL OMNIPAQUE IOHEXOL 300 MG/ML  SOLN COMPARISON:  CT 11/01/2019, PET-CT 7211 FINDINGS: Cardiovascular: No significant vascular findings. Normal heart size. No pericardial effusion. Mediastinum/Nodes: Port in the anterior chest wall with tip in distal SVC. No axillary or supraclavicular adenopathy. No mediastinal or hilar adenopathy. No pericardial fluid. Esophagus normal. Lungs/Pleura: Interval decrease in volume of the RIGHT infrahilar mass which measures 3.3 x 2.4 cm in axial dimension compared to 5.4 x 4.3 cm on CT 11/01/2019. Upper Abdomen: Limited view of the liver, kidneys, pancreas are unremarkable. Normal adrenal glands. Low-density lesion in the LEFT hepatic lobe measuring 13 mm is unchanged and was not hypermetabolic on comparison PET-CT scan. Additionally lesion not changed from CT 06/15/2016. Musculoskeletal: No acute osseous abnormality. IMPRESSION: 1. Interval decrease in volume of RIGHT  infrahilar mass. 2. No evidence of metastatic mediastinal lymphadenopathy. 3. No new or progressive disease. 4. Benign LEFT hepatic lobe hypodensity. Electronically Signed   By: Suzy Bouchard M.D.   On: 01/31/2020 10:00   MR BRAIN W WO CONTRAST  Result Date: 02/21/2020 CLINICAL DATA:  Brain/CNS neoplasm, assess treatment response. EXAM: MRI HEAD WITHOUT AND WITH CONTRAST  TECHNIQUE: Multiplanar, multiecho pulse sequences of the brain and surrounding structures were obtained without and with intravenous contrast. CONTRAST:  33mL MULTIHANCE GADOBENATE DIMEGLUMINE 529 MG/ML IV SOLN COMPARISON:  MRI of the brain November 07, 2019. FINDINGS: Brain: No acute infarction, hemorrhage, hydrocephalus or extra-axial collection. Small amount of scattered foci of T2 hyperintensity within the white matter of the cerebral hemispheres, nonspecific. Necrotic lesion with rim irregular rim enhancement within the left cerebellar hemisphere appear mildly enlarged compared to prior, measuring approximately 27 x 24 x 18 mm (24 x 23 x 18 mm on prior). There is interval development of contrast enhancement along the cerebellar sulci, consistent with leptomeningeal spread. Prominent surrounding vasogenic edema involving the left cerebellar hemisphere, vermis and left middle cerebellar peduncle with mass effect and rightward deviation of the fourth ventricle, cerebellar vermis and left superior cerebellar peduncle. There is effacement of the left cerebellopontine angle at the level of the left porus acusticus. Significant interval increase in size of a left posterior frontal enhancing lesion involving the precentral gyrus measuring approximately 9 x 9 mm (1 mm on prior) now with surrounding vasogenic edema. Interval development of multiple new enhancing lesions, labeled on series 11: *Right precentral gyrus, 7 mm, image 125; *Right thalamus, 3 mm, image 89; *Left occipital lobe, 6 mm, image 91; *Left occipital lobe, 2 mm, image 75; *Left  occipital lobe, 5 mm, image 72; *Right temporal lobe, 2 mm, image 72. Vascular: Normal flow voids. Skull and upper cervical spine: Degenerative changes of the cervical spine. Otherwise, normal marrow signal. Sinuses/Orbits: Mucous retention cyst within the right maxillary sinus. The orbits are maintained. Other: None. IMPRESSION: 1. Mild increase in size of the left cerebellar hemisphere necrotic lesion with interval development of leptomeningeal spread of disease. Stable associated vasogenic edema and mass effect on the fourth ventricle. No hydrocephalus. 2. Significant interval increase in size of a left posterior frontal enhancing lesion measuring 9 mm (1 mm on prior) now with surrounding vasogenic edema. 3. Interval development of multiple new enhancing lesions, as described above. These results will be called to the ordering clinician or representative by the Radiologist Assistant, and communication documented in the PACS or [employer]. Electronically Signed   By: Pedro Earls M.D.   On: 02/21/2020 11:26    Assessment/Plan Brain metastasis ([place]) [C79.31]  [name] is clinically stable following higher dose dexamethasone therapy.  Unfortunately MRI brain demonstrates 7 new sites of disease, all sub-cm.  None of these appears to be focal/symptomatic at this time.  We discussed his case in brain/spine tumor board this week and recommended salvage SRS to each new metastasis.  He is agreeable to this plan, treatment should be in single fraction.  Re-eval with radiation oncology was completed.  Recommended decreasing decadron to 4mg /2mg  x1 week, then 4mg  daily.  We will call him in 2 weeks to assess response and continue titration.  Otherwise we ask that [name] return to clinic in 3 months following next brain MRI, or sooner as needed.  We appreciate the opportunity to participate in the care of [place].   All questions were answered. The patient  knows to call the clinic with any problems, questions or concerns. No barriers to learning were detected.  The total time spent in the encounter was 30 minutes and more than 50% was on counseling and review of test results   Ventura Sellers, MD Medical Director of Neuro-Oncology [facility] 02/25/20 10:52  AM

## 2020-02-26 ENCOUNTER — Telehealth: Payer: Self-pay | Admitting: Internal Medicine

## 2020-02-26 NOTE — Telephone Encounter (Signed)
Rescheduled appt - pt wife is aware of appt date and time - r/s per 11/3 sch msg

## 2020-03-03 ENCOUNTER — Other Ambulatory Visit: Payer: Managed Care, Other (non HMO)

## 2020-03-03 ENCOUNTER — Ambulatory Visit: Payer: Managed Care, Other (non HMO) | Admitting: Internal Medicine

## 2020-03-03 ENCOUNTER — Ambulatory Visit: Payer: Managed Care, Other (non HMO)

## 2020-03-04 DIAGNOSIS — F17210 Nicotine dependence, cigarettes, uncomplicated: Secondary | ICD-10-CM | POA: Diagnosis not present

## 2020-03-04 DIAGNOSIS — C3412 Malignant neoplasm of upper lobe, left bronchus or lung: Secondary | ICD-10-CM | POA: Diagnosis not present

## 2020-03-04 DIAGNOSIS — E039 Hypothyroidism, unspecified: Secondary | ICD-10-CM | POA: Diagnosis not present

## 2020-03-04 DIAGNOSIS — Z79899 Other long term (current) drug therapy: Secondary | ICD-10-CM | POA: Diagnosis not present

## 2020-03-04 DIAGNOSIS — C7931 Secondary malignant neoplasm of brain: Secondary | ICD-10-CM | POA: Diagnosis present

## 2020-03-04 DIAGNOSIS — R531 Weakness: Secondary | ICD-10-CM | POA: Diagnosis not present

## 2020-03-04 DIAGNOSIS — F1010 Alcohol abuse, uncomplicated: Secondary | ICD-10-CM | POA: Diagnosis not present

## 2020-03-04 DIAGNOSIS — K589 Irritable bowel syndrome without diarrhea: Secondary | ICD-10-CM | POA: Diagnosis not present

## 2020-03-04 DIAGNOSIS — Z5111 Encounter for antineoplastic chemotherapy: Secondary | ICD-10-CM | POA: Diagnosis not present

## 2020-03-04 DIAGNOSIS — E785 Hyperlipidemia, unspecified: Secondary | ICD-10-CM | POA: Diagnosis not present

## 2020-03-04 DIAGNOSIS — R5383 Other fatigue: Secondary | ICD-10-CM | POA: Diagnosis not present

## 2020-03-04 DIAGNOSIS — K219 Gastro-esophageal reflux disease without esophagitis: Secondary | ICD-10-CM | POA: Diagnosis not present

## 2020-03-05 ENCOUNTER — Ambulatory Visit
Admission: RE | Admit: 2020-03-05 | Discharge: 2020-03-05 | Disposition: A | Discharge status: Home / Self Care | Payer: Managed Care, Other (non HMO) | Source: Ambulatory Visit | Attending: Radiation Oncology | Admitting: Radiation Oncology

## 2020-03-05 ENCOUNTER — Other Ambulatory Visit: Payer: Self-pay

## 2020-03-05 ENCOUNTER — Encounter: Payer: Self-pay | Admitting: Radiation Oncology

## 2020-03-05 DIAGNOSIS — J441 Chronic obstructive pulmonary disease with (acute) exacerbation: Secondary | ICD-10-CM

## 2020-03-05 DIAGNOSIS — C7931 Secondary malignant neoplasm of brain: Secondary | ICD-10-CM | POA: Diagnosis not present

## 2020-03-05 NOTE — Progress Notes (Signed)
Joseph Atkins rested with [name] for 30 minutes following his SRS treatment.  Patient denies headache, dizziness, nausea, diplopia or ringing in the ears. Denies fatigue. Patient without complaints. Understands to avoid strenuous activity for the next 24 hours and call [phone] with needs. He has been having some increased weakness in his right leg and some dizziness since tapering his steroids to 2 mg BID.  I spoke with Dr. Mickeal Skinner and he wants the patient to increase his steroids back to 4 mg BID.  Patient and wife verbalized understanding.  He will see Dr. Mickeal Skinner for appt next week.  BP (!) 149/77 (BP Location: Left Arm, Patient Position: Sitting, Cuff Size: Large)   Pulse 79   Temp 98.4 F (36.9 C)   Resp 20   SpO2 100%   [name] M. [name], BSN

## 2020-03-07 ENCOUNTER — Other Ambulatory Visit: Payer: Self-pay | Admitting: Radiation Oncology

## 2020-03-10 ENCOUNTER — Inpatient Hospital Stay (HOSPITAL_BASED_OUTPATIENT_CLINIC_OR_DEPARTMENT_OTHER): Payer: Managed Care, Other (non HMO) | Admitting: Internal Medicine

## 2020-03-10 ENCOUNTER — Inpatient Hospital Stay: Payer: Managed Care, Other (non HMO)

## 2020-03-10 ENCOUNTER — Other Ambulatory Visit: Payer: Self-pay | Admitting: *Deleted

## 2020-03-10 ENCOUNTER — Other Ambulatory Visit: Payer: Self-pay

## 2020-03-10 ENCOUNTER — Encounter: Payer: Self-pay | Admitting: Internal Medicine

## 2020-03-10 VITALS — BP 135/86 | HR 100 | Temp 97.0°F | Resp 16 | Ht 68.0 in | Wt 178.5 lb

## 2020-03-10 DIAGNOSIS — C7931 Secondary malignant neoplasm of brain: Secondary | ICD-10-CM

## 2020-03-10 DIAGNOSIS — C3491 Malignant neoplasm of unspecified part of right bronchus or lung: Secondary | ICD-10-CM | POA: Diagnosis not present

## 2020-03-10 DIAGNOSIS — Z5112 Encounter for antineoplastic immunotherapy: Secondary | ICD-10-CM

## 2020-03-10 DIAGNOSIS — I10 Essential (primary) hypertension: Secondary | ICD-10-CM | POA: Diagnosis not present

## 2020-03-10 DIAGNOSIS — Z95828 Presence of other vascular implants and grafts: Secondary | ICD-10-CM

## 2020-03-10 LAB — CBC WITH DIFFERENTIAL (CANCER CENTER ONLY)
Abs Immature Granulocytes: 0.52 10*3/uL — ABNORMAL HIGH (ref 0.00–0.07)
Basophils Absolute: 0.1 10*3/uL (ref 0.0–0.1)
Basophils Relative: 0 %
Eosinophils Absolute: 0 10*3/uL (ref 0.0–0.5)
Eosinophils Relative: 0 %
HCT: 39.1 % (ref 39.0–52.0)
Hemoglobin: 13 g/dL (ref 13.0–17.0)
Immature Granulocytes: 5 %
Lymphocytes Relative: 10 %
Lymphs Abs: 1.2 10*3/uL (ref 0.7–4.0)
MCH: 34.7 pg — ABNORMAL HIGH (ref 26.0–34.0)
MCHC: 33.2 g/dL (ref 30.0–36.0)
MCV: 104.3 fL — ABNORMAL HIGH (ref 80.0–100.0)
Monocytes Absolute: 0.5 10*3/uL (ref 0.1–1.0)
Monocytes Relative: 5 %
Neutro Abs: 9 10*3/uL — ABNORMAL HIGH (ref 1.7–7.7)
Neutrophils Relative %: 80 %
Platelet Count: 211 10*3/uL (ref 150–400)
RBC: 3.75 MIL/uL — ABNORMAL LOW (ref 4.22–5.81)
RDW: 13.7 % (ref 11.5–15.5)
WBC Count: 11.3 10*3/uL — ABNORMAL HIGH (ref 4.0–10.5)
nRBC: 0.2 % (ref 0.0–0.2)

## 2020-03-10 LAB — CMP (CANCER CENTER ONLY)
ALT: 57 U/L — ABNORMAL HIGH (ref 0–44)
AST: 24 U/L (ref 15–41)
Albumin: 3.7 g/dL (ref 3.5–5.0)
Alkaline Phosphatase: 62 U/L (ref 38–126)
Anion gap: 10 (ref 5–15)
BUN: 18 mg/dL (ref 6–20)
CO2: 25 mmol/L (ref 22–32)
Calcium: 8.9 mg/dL (ref 8.9–10.3)
Chloride: 102 mmol/L (ref 98–111)
Creatinine: 0.83 mg/dL (ref 0.61–1.24)
GFR, Estimated: >60 mL/min (ref >60)
Glucose, Bld: 183 mg/dL — ABNORMAL HIGH (ref 70–99)
Potassium: 3.9 mmol/L (ref 3.5–5.1)
Sodium: 137 mmol/L (ref 135–145)
Total Bilirubin: 0.7 mg/dL (ref 0.3–1.2)
Total Protein: 6.6 g/dL (ref 6.5–8.1)

## 2020-03-10 LAB — TSH: TSH: 0.419 u[IU]/mL (ref 0.320–4.118)

## 2020-03-10 MED ORDER — HEPARIN SOD (PORK) LOCK FLUSH 100 UNIT/ML IV SOLN
500.0000 [IU] | Freq: Once | INTRAVENOUS | Status: DC
  Filled 2020-03-10: qty 5

## 2020-03-10 MED ORDER — SODIUM CHLORIDE 0.9% FLUSH
10.0000 mL | INTRAVENOUS | Status: DC | PRN
  Filled 2020-03-10: qty 10

## 2020-03-10 MED ORDER — SODIUM CHLORIDE 0.9% FLUSH
10.0000 mL | Freq: Once | INTRAVENOUS | Status: AC
  Administered 2020-03-10: 10 mL
  Filled 2020-03-10: qty 10

## 2020-03-10 NOTE — Progress Notes (Signed)
I connected with Joseph Atkins on 03/10/20 at  2:00 PM EST by telephone visit and verified that I am speaking with the correct person using two identifiers.  I discussed the limitations, risks, security and privacy concerns of performing an evaluation and management service by telemedicine and the availability of in-person appointments. I also discussed with the patient that there may be a patient responsible charge related to this service. The patient expressed understanding and agreed to proceed.  Other persons participating in the visit and their role in the encounter:  n/a  Patient's location:  Home  Provider's location:  Office  Chief Complaint:  Brain metastasis ([place])  History of Present Ilness: Joseph Atkins described fatigue, lethargy, irritability during recent decadron taper; he then increased his dose back to 4mg  twice per day.  He feels back to baseline on the 8mg  daily dose.  Right leg weakness continues to be a problem, though he is uncertain whether it is static or progressive.  Dr. Julien Nordmann did not infuse him with [place] today because of steroid dose. Observations: Language and cognition at baseline Assessment and Plan: Brain metastasis ([place])  Recommend re-trial of decadron taper.  We educated him regarding the difference between "general" decompensation with systemic symptoms, vs "focal" decompensation with worsening focal neurologic symptoms.  For the former, sometimes it takes a few days for endocrine system to balance out the lower dose.  The latter is more concerning and a direct indication of mass effect or focal inflammation.    Recommended trial of 4mg /2mg  x3 days, then decrease to 4mg  daily.  We will call him in a week to help further with the taper.    If needed, can consider IV avastin as salvage for refractory symptoms and to "free up" Keytruda.  Follow Up Instructions:  Will call him to continue titration of decadron in 1 week  I discussed the assessment and  treatment plan with the patient.  The patient was provided an opportunity to ask questions and all were answered.  The patient agreed with the plan and demonstrated understanding of the instructions.    The patient was advised to call back or seek an in-person evaluation if the symptoms worsen or if the condition fails to improve as anticipated.  I provided 5-10 minutes of non-face-to-face time during this enocunter.  Ventura Sellers, MD   I provided 15 minutes of non face-to-face telephone visit time during this encounter, and > 50% was spent counseling as documented under my assessment & plan.

## 2020-03-10 NOTE — Progress Notes (Signed)
[place] [phone]   [phone]  OFFICE PROGRESS NOTE  [name], NP [address] [name] [address] [place] [address]  DIAGNOSIS: Stage IV (T3, N0, M1C) non-small cell lung cancer, adenocarcinoma.The patient presented with a right lower lobe/infrahilar mass as well as a solitary brain metastasis in the left cerebellum. He was diagnosed in July 2021.  Molecular Biomarkers:  MSI-High DETECTED Pembrolizumab Atezolizumab, Avelumab, Cemiplimab, Dostarlimab, Durvalumab, Ipilimumab, Nivolumab  STK11Splice Site SNV 1.7% Everolimus, Temsirolimus Yes  [id number] 1.7% Binimetinib Yes  [id number] 0.4%  Niraparib, Olaparib, Rucaparib, Talazoparib, Tazemetostat Yes  PRIOR THERAPY:  1) SRS to the solitary brain metastasis under the care of Dr. Lisbeth Renshaw. Last treatment 11/14/19. 2) Weekly concurrent chemoradiation with carboplatin for an AUC of 2, paclitaxel 45 mg/m2.First dose expected on 11/25/2019. Status post 7 cycles, last dose was giving 01/06/2020 with partial response.   CURRENT THERAPY:  Immunotherapy with Keytruda 200 mg IV every 3 weeks.  First dose February 10, 2020 for a patient with MSI high.  Status post 1 cycle.  INTERVAL HISTORY: Joseph Atkins 59 y.o. male returns to the clinic today for follow-up visit.  The patient is feeling fine today except for the generalized fatigue and weakness as well as more weakness in the right lower extremity.  He was found on recent MRI of the brain to have multiple new brain metastasis as well as worsening vasogenic edema.  His dose of Decadron was increased to 8 mg p.o. daily.  The patient denied having any chest pain, shortness of breath, cough or hemoptysis.  He denied having any fever or chills.  He has no nausea, vomiting, diarrhea or constipation.  He tolerated the first cycle of his treatment with Keytruda fairly well.  He is here today for evaluation before starting cycle  #2.  MEDICAL HISTORY: Past Medical History:  Diagnosis Date  . Cancer ([facility])    lung cancer  . Diverticulosis   . GERD (gastroesophageal reflux disease)   . Hx of small bowel obstruction   . Hyperlipidemia   . Hypothyroidism   . IBS (irritable bowel syndrome)   . Substance abuse ([facility])    Alcoholic, Drug addition  . Thyroid disease     ALLERGIES:  is allergic to penicillins.  MEDICATIONS:  Current Outpatient Medications  Medication Sig Dispense Refill  . albuterol (PROVENTIL HFA;VENTOLIN HFA) 108 (90 Base) MCG/ACT inhaler Inhale 2 puffs into the lungs every 6 (six) hours as needed for wheezing or shortness of breath. 1 Inhaler 0  . ANORO ELLIPTA 62.5-25 MCG/INH AEPB 1 puff daily.    Marland Kitchen dexamethasone (DECADRON) 2 MG tablet Take 2 tablets (4 mg total) by mouth 2 (two) times daily. 120 tablet 2  . docusate sodium (COLACE) 100 MG capsule Take 100 mg by mouth daily.    . DULoxetine (CYMBALTA) 20 MG capsule Take 1 capsule (20 mg total) by mouth 2 (two) times daily. 60 capsule 5  . fluticasone furoate-vilanterol (BREO ELLIPTA) 100-25 MCG/INH AEPB Inhale 1 puff into the lungs daily. 30 each 3  . levothyroxine (SYNTHROID) 75 MCG tablet Take 75 mcg by mouth daily.    Marland Kitchen omeprazole (PRILOSEC) 40 MG capsule Take 1 capsule (40 mg total) by mouth daily. 90 capsule 4  . ondansetron (ZOFRAN) 4 MG tablet Take 1 tablet (4 mg total) by mouth every 8 (eight) hours as needed. 40 tablet 2  . polyethylene glycol powder (MIRALAX) powder Take 17 g by mouth daily.  255 g 11  . prochlorperazine (COMPAZINE) 10 MG tablet Take 1 tablet (10 mg total) by mouth every 6 (six) hours as needed. 30 tablet 2  . TRULANCE 3 MG TABS Take 1 tablet by mouth daily.    Marland Kitchen zolpidem (AMBIEN CR) 12.5 MG CR tablet Take 1 tablet (12.5 mg total) by mouth at bedtime as needed for sleep. 30 tablet 5  . cyclobenzaprine (FLEXERIL) 10 MG tablet Take 10 mg by mouth 2 (two) times daily as needed for muscle spasms. (Patient not taking: Reported  on 02/03/2020)    . lidocaine (XYLOCAINE) 2 % solution Use as directed 15 mLs in the mouth or throat every 6 (six) hours as needed for mouth pain. Do not eat/drink within 60 minutes of taking this medication (Patient not taking: Reported on 02/20/2020) 100 mL 0  . lidocaine-prilocaine (EMLA) cream Apply 1 application topically as needed. (Patient not taking: Reported on 03/10/2020) 30 g 0  . oxyCODONE-acetaminophen (PERCOCET/ROXICET) 5-325 MG tablet Take 1 tablet by mouth every 4 (four) hours as needed for severe pain. (Patient not taking: Reported on 03/10/2020) 30 tablet 0  . sucralfate (CARAFATE) 1 g tablet Take 1 tablet (1 g total) by mouth 4 (four) times daily. Dissolve each tablet in 15 cc water before use. (Patient not taking: Reported on 03/10/2020) 120 tablet 2   No current facility-administered medications for this visit.    SURGICAL HISTORY:  Past Surgical History:  Procedure Laterality Date  . arm surgery Right   . BRONCHIAL BRUSHINGS  10/24/2019   Procedure: BRONCHIAL BRUSHINGS;  Surgeon: Collene Gobble, MD;  Location: [facility] ENDOSCOPY;  Service: Cardiopulmonary;;  right lower lobe   . BRONCHIAL BRUSHINGS  11/05/2019   Procedure: BRONCHIAL BRUSHINGS;  Surgeon: Collene Gobble, MD;  Location: [facility] ENDOSCOPY;  Service: Pulmonary;;  . BRONCHIAL NEEDLE ASPIRATION BIOPSY  10/24/2019   Procedure: BRONCHIAL NEEDLE ASPIRATION BIOPSIES;  Surgeon: Collene Gobble, MD;  Location: [place];  Service: Cardiopulmonary;;  . BRONCHIAL NEEDLE ASPIRATION BIOPSY  11/05/2019   Procedure: BRONCHIAL NEEDLE ASPIRATION BIOPSIES;  Surgeon: Collene Gobble, MD;  Location: [facility] ENDOSCOPY;  Service: Pulmonary;;  . ENDOBRONCHIAL ULTRASOUND N/A 10/24/2019   Procedure: ENDOBRONCHIAL ULTRASOUND;  Surgeon: Collene Gobble, MD;  Location: [place];  Service: Cardiopulmonary;  Laterality: N/A;  . FINGER SURGERY Right    Middle  . IR IMAGING GUIDED PORT INSERTION  11/19/2019  . VIDEO BRONCHOSCOPY N/A 10/24/2019    Procedure: VIDEO BRONCHOSCOPY WITHOUT FLUORO;  Surgeon: Collene Gobble, MD;  Location: [facility] ENDOSCOPY;  Service: Cardiopulmonary;  Laterality: N/A;  . VIDEO BRONCHOSCOPY WITH ENDOBRONCHIAL NAVIGATION N/A 11/05/2019   Procedure: VIDEO BRONCHOSCOPY WITH ENDOBRONCHIAL NAVIGATION;  Surgeon: Collene Gobble, MD;  Location: [facility] ENDOSCOPY;  Service: Pulmonary;  Laterality: N/A;    REVIEW OF SYSTEMS:  Constitutional: positive for fatigue Eyes: negative Ears, nose, mouth, throat, and face: negative Respiratory: negative Cardiovascular: negative Gastrointestinal: negative Genitourinary:negative Integument/breast: negative Hematologic/lymphatic: negative Musculoskeletal:positive for muscle weakness Neurological: negative Behavioral/Psych: negative Endocrine: negative Allergic/Immunologic: negative   PHYSICAL EXAMINATION: General appearance: alert, cooperative, fatigued and no distress Head: Normocephalic, without obvious abnormality, atraumatic Neck: no adenopathy, no JVD, supple, symmetrical, trachea midline and thyroid not enlarged, symmetric, no tenderness/mass/nodules Lymph nodes: Cervical, supraclavicular, and axillary nodes normal. Resp: clear to auscultation bilaterally Back: symmetric, no curvature. ROM normal. No CVA tenderness. Cardio: regular rate and rhythm, S1, S2 normal, no murmur, click, rub or gallop GI: soft, non-tender; bowel sounds normal; no masses,  no organomegaly Extremities: extremities  normal, atraumatic, no cyanosis or edema Neurologic: Motor: Weakness of the right lower extremity.  ECOG PERFORMANCE STATUS: 1 - Symptomatic but completely ambulatory  Blood pressure 135/86, pulse 100, temperature (!) 97 F (36.1 C), temperature source Tympanic, resp. rate 16, height _0  (1.727 m), weight 178 lb 8 oz (81 kg), SpO2 100 %.  LABORATORY DATA: Lab Results  Component Value Date   WBC 11.3 (H) 03/10/2020   HGB 13.0 03/10/2020   HCT 39.1 03/10/2020   MCV 104.3 (H)  03/10/2020   PLT 211 03/10/2020      Chemistry      Component Value Date/Time   NA 141 02/18/2020 1355   NA 141 03/08/2017 1707   K 4.0 02/18/2020 1355   CL 105 02/18/2020 1355   CO2 26 02/18/2020 1355   BUN 8 02/18/2020 1355   BUN 12 03/08/2017 1707   CREATININE 0.75 02/18/2020 1355      Component Value Date/Time   CALCIUM 9.3 02/18/2020 1355   ALKPHOS 65 02/18/2020 1355   AST 21 02/18/2020 1355   ALT 50 (H) 02/18/2020 1355   BILITOT 0.4 02/18/2020 1355       RADIOGRAPHIC STUDIES: MR BRAIN W WO CONTRAST  Result Date: 02/21/2020 CLINICAL DATA:  Brain/CNS neoplasm, assess treatment response. EXAM: MRI HEAD WITHOUT AND WITH CONTRAST TECHNIQUE: Multiplanar, multiecho pulse sequences of the brain and surrounding structures were obtained without and with intravenous contrast. CONTRAST:  35m MULTIHANCE GADOBENATE DIMEGLUMINE 529 MG/ML IV SOLN COMPARISON:  MRI of the brain November 07, 2019. FINDINGS: Brain: No acute infarction, hemorrhage, hydrocephalus or extra-axial collection. Small amount of scattered foci of T2 hyperintensity within the white matter of the cerebral hemispheres, nonspecific. Necrotic lesion with rim irregular rim enhancement within the left cerebellar hemisphere appear mildly enlarged compared to prior, measuring approximately 27 x 24 x 18 mm (24 x 23 x 18 mm on prior). There is interval development of contrast enhancement along the cerebellar sulci, consistent with leptomeningeal spread. Prominent surrounding vasogenic edema involving the left cerebellar hemisphere, vermis and left middle cerebellar peduncle with mass effect and rightward deviation of the fourth ventricle, cerebellar vermis and left superior cerebellar peduncle. There is effacement of the left cerebellopontine angle at the level of the left porus acusticus. Significant interval increase in size of a left posterior frontal enhancing lesion involving the precentral gyrus measuring approximately 9 x 9 mm (1 mm  on prior) now with surrounding vasogenic edema. Interval development of multiple new enhancing lesions, labeled on series 11: *Right precentral gyrus, 7 mm, image 125; *Right thalamus, 3 mm, image 89; *Left occipital lobe, 6 mm, image 91; *Left occipital lobe, 2 mm, image 75; *Left occipital lobe, 5 mm, image 72; *Right temporal lobe, 2 mm, image 72. Vascular: Normal flow voids. Skull and upper cervical spine: Degenerative changes of the cervical spine. Otherwise, normal marrow signal. Sinuses/Orbits: Mucous retention cyst within the right maxillary sinus. The orbits are maintained. Other: None. IMPRESSION: 1. Mild increase in size of the left cerebellar hemisphere necrotic lesion with interval development of leptomeningeal spread of disease. Stable associated vasogenic edema and mass effect on the fourth ventricle. No hydrocephalus. 2. Significant interval increase in size of a left posterior frontal enhancing lesion measuring 9 mm (1 mm on prior) now with surrounding vasogenic edema. 3. Interval development of multiple new enhancing lesions, as described above. These results will be called to the ordering clinician or representative by the Radiologist Assistant, and communication documented in the PACS or [employer]  Electronically Signed   By: Pedro Earls M.D.   On: 02/21/2020 11:26    ASSESSMENT AND PLAN: This is a very pleasant 59 years old white male with a stage IV (t3, N0, M1c) non-small cell lung cancer, adenocarcinoma with MSI high presented with right lower lobe/infrahilar mass in addition to solitary brain metastasis in the left cerebellum diagnosed in July 2021. He is status post SRS to the solitary brain metastasis. The patient completed a course of concurrent chemoradiation with weekly carboplatin and paclitaxel.  He tolerated the treatment well except for fatigue and mild odynophagia. He had repeat CT scan of the chest performed recently.  I personally and independently  reviewed the scans and discussed the results with the patient and his wife. His scan showed interval decrease in the volume of the right infrahilar mass with no other evidence of metastatic disease. The patient has MSI high and I recommended for him treatment with immunotherapy with single agent Keytruda 200 mg IV every 3 weeks for a total of 2 years unless the patient has unacceptable toxicity or disease progression. He tolerated the first cycle of his treatment well with no concerning complaints. Unfortunately he is on a high dose Decadron now 8 mg p.o. daily.  I recommended for the patient to delay the start of cycle #2 until his Decadron dose is less than 2 mg/day. For the vasogenic edema, I will discuss with Dr. Mickeal Skinner consideration of treatment with 1 or 2 doses of Avastin and taper the Decadron dose. He will come back for follow-up visit in 3 weeks for evaluation before the next cycle of his treatment. The patient was advised to call immediately if he has any concerning symptoms in the interval. The patient voices understanding of current disease status and treatment options and is in agreement with the current care plan.  All questions were answered. The patient knows to call the clinic with any problems, questions or concerns. We can certainly see the patient much sooner if necessary.  Disclaimer: This note was dictated with voice recognition software. Similar sounding words can inadvertently be transcribed and may not be corrected upon review.

## 2020-03-12 ENCOUNTER — Other Ambulatory Visit: Payer: Self-pay | Admitting: Medical

## 2020-03-12 DIAGNOSIS — F419 Anxiety disorder, unspecified: Secondary | ICD-10-CM

## 2020-03-12 DIAGNOSIS — F321 Major depressive disorder, single episode, moderate: Secondary | ICD-10-CM

## 2020-03-17 ENCOUNTER — Inpatient Hospital Stay: Payer: Managed Care, Other (non HMO) | Admitting: Internal Medicine

## 2020-03-17 DIAGNOSIS — C7931 Secondary malignant neoplasm of brain: Secondary | ICD-10-CM

## 2020-03-17 NOTE — Telephone Encounter (Signed)
Hi Dr. Mickeal Skinner and Julien Nordmann, This went to [facility] but you both see him so sending to your prescription refills.  Thanks! Threasa Beards

## 2020-03-23 ENCOUNTER — Other Ambulatory Visit: Payer: Managed Care, Other (non HMO)

## 2020-03-23 ENCOUNTER — Ambulatory Visit: Payer: Managed Care, Other (non HMO)

## 2020-03-23 ENCOUNTER — Ambulatory Visit: Payer: Managed Care, Other (non HMO) | Admitting: Physician Assistant

## 2020-03-24 ENCOUNTER — Other Ambulatory Visit: Payer: Self-pay | Admitting: Internal Medicine

## 2020-03-24 DIAGNOSIS — J441 Chronic obstructive pulmonary disease with (acute) exacerbation: Secondary | ICD-10-CM | POA: Insufficient documentation

## 2020-03-24 NOTE — Addendum Note (Signed)
Encounter addended by: Kristeen Miss, MD on: 03/24/2020 6:01 PM  Actions taken: Actions taken from a BestPractice Advisory, Visit diagnoses modified, Problem List modified

## 2020-03-30 NOTE — Progress Notes (Signed)
  Name: Joseph Atkins  MRN: [id number]  Date: 03/05/2020   DOB: 08-16-1960  Stereotactic Radiosurgery Operative Note  PRE-OPERATIVE DIAGNOSIS:  Solitary Brain Metastasis  POST-OPERATIVE DIAGNOSIS:  Solitary Brain Metastasis  PROCEDURE:  Stereotactic Radiosurgery  SURGEON:  Earleen Newport, MD  NARRATIVE: The patient underwent a radiation treatment planning session in the radiation oncology simulation suite under the care of the radiation oncology physician and physicist.  I participated closely in the radiation treatment planning afterwards. The patient underwent planning CT which was fused to 3T high resolution MRI with 1 mm axial slices.  These images were fused on the planning system.  Radiation oncology contoured the gross target volume and subsequently expanded this to yield the Planning Target Volume. I actively participated in the planning process.  I helped to define and review the target contours and also the contours of the optic pathway, eyes, brainstem and selected nearby organs at risk.  All the dose constraints for critical structures were reviewed and compared to AAPM Task Group 101.  The prescription dose conformity was reviewed.  I approved the plan electronically.    Accordingly, [name] was brought to the TrueBeam stereotactic radiation treatment linac and placed in the custom immobilization mask.  The patient was aligned according to the IR fiducial markers with BrainLab Exactrac, then orthogonal x-rays were used in ExacTrac with the 6DOF robotic table and the shifts were made to align the patient  This lesion was complex because it was 3.5 cm or greater, adjacent (within 39mm) of the optic nerve, optic chasm, optic tract, and/or within the brainstem is complex  [name] received stereotactic radiosurgery uneventfully.  The detailed description of the procedure is recorded in the radiation oncology procedure note.  I was present for the duration of the  procedure.  DISPOSITION:  Following delivery, the patient was transported to nursing in stable condition and monitored for possible acute effects to be discharged to home in stable condition with follow-up in one month.  Earleen Newport, MD 03/30/2020 1:34 PM

## 2020-03-30 NOTE — Addendum Note (Signed)
Encounter addended by: Kristeen Miss, MD on: 03/30/2020 1:38 PM  Actions taken: Clinical Note Signed

## 2020-03-31 ENCOUNTER — Telehealth: Payer: Self-pay | Admitting: Internal Medicine

## 2020-03-31 ENCOUNTER — Encounter: Payer: Self-pay | Admitting: Internal Medicine

## 2020-03-31 ENCOUNTER — Other Ambulatory Visit: Payer: Self-pay

## 2020-03-31 ENCOUNTER — Inpatient Hospital Stay: Payer: Managed Care, Other (non HMO) | Attending: Radiation Oncology

## 2020-03-31 ENCOUNTER — Inpatient Hospital Stay: Payer: Managed Care, Other (non HMO)

## 2020-03-31 ENCOUNTER — Inpatient Hospital Stay (HOSPITAL_BASED_OUTPATIENT_CLINIC_OR_DEPARTMENT_OTHER): Payer: Managed Care, Other (non HMO) | Admitting: Internal Medicine

## 2020-03-31 ENCOUNTER — Other Ambulatory Visit: Payer: Self-pay | Admitting: Medical Oncology

## 2020-03-31 VITALS — HR 100

## 2020-03-31 VITALS — BP 142/87 | HR 112 | Temp 97.6°F | Resp 17 | Ht 68.0 in | Wt 185.1 lb

## 2020-03-31 DIAGNOSIS — F1010 Alcohol abuse, uncomplicated: Secondary | ICD-10-CM | POA: Insufficient documentation

## 2020-03-31 DIAGNOSIS — E785 Hyperlipidemia, unspecified: Secondary | ICD-10-CM | POA: Insufficient documentation

## 2020-03-31 DIAGNOSIS — C3431 Malignant neoplasm of lower lobe, right bronchus or lung: Secondary | ICD-10-CM

## 2020-03-31 DIAGNOSIS — Z5112 Encounter for antineoplastic immunotherapy: Secondary | ICD-10-CM

## 2020-03-31 DIAGNOSIS — R5383 Other fatigue: Secondary | ICD-10-CM | POA: Insufficient documentation

## 2020-03-31 DIAGNOSIS — K219 Gastro-esophageal reflux disease without esophagitis: Secondary | ICD-10-CM | POA: Insufficient documentation

## 2020-03-31 DIAGNOSIS — E039 Hypothyroidism, unspecified: Secondary | ICD-10-CM | POA: Insufficient documentation

## 2020-03-31 DIAGNOSIS — C7931 Secondary malignant neoplasm of brain: Secondary | ICD-10-CM | POA: Insufficient documentation

## 2020-03-31 DIAGNOSIS — I10 Essential (primary) hypertension: Secondary | ICD-10-CM | POA: Diagnosis not present

## 2020-03-31 DIAGNOSIS — E079 Disorder of thyroid, unspecified: Secondary | ICD-10-CM | POA: Insufficient documentation

## 2020-03-31 DIAGNOSIS — C3491 Malignant neoplasm of unspecified part of right bronchus or lung: Secondary | ICD-10-CM

## 2020-03-31 DIAGNOSIS — K589 Irritable bowel syndrome without diarrhea: Secondary | ICD-10-CM | POA: Insufficient documentation

## 2020-03-31 DIAGNOSIS — G936 Cerebral edema: Secondary | ICD-10-CM | POA: Insufficient documentation

## 2020-03-31 LAB — CBC WITH DIFFERENTIAL (CANCER CENTER ONLY)
Abs Immature Granulocytes: 0.57 10*3/uL — ABNORMAL HIGH (ref 0.00–0.07)
Basophils Absolute: 0.1 10*3/uL (ref 0.0–0.1)
Basophils Relative: 1 %
Eosinophils Absolute: 0 10*3/uL (ref 0.0–0.5)
Eosinophils Relative: 0 %
HCT: 41.4 % (ref 39.0–52.0)
Hemoglobin: 13.9 g/dL (ref 13.0–17.0)
Immature Granulocytes: 5 %
Lymphocytes Relative: 12 %
Lymphs Abs: 1.4 10*3/uL (ref 0.7–4.0)
MCH: 34.2 pg — ABNORMAL HIGH (ref 26.0–34.0)
MCHC: 33.6 g/dL (ref 30.0–36.0)
MCV: 102 fL — ABNORMAL HIGH (ref 80.0–100.0)
Monocytes Absolute: 0.6 10*3/uL (ref 0.1–1.0)
Monocytes Relative: 5 %
Neutro Abs: 9 10*3/uL — ABNORMAL HIGH (ref 1.7–7.7)
Neutrophils Relative %: 77 %
Platelet Count: 213 10*3/uL (ref 150–400)
RBC: 4.06 MIL/uL — ABNORMAL LOW (ref 4.22–5.81)
RDW: 13.1 % (ref 11.5–15.5)
WBC Count: 11.7 10*3/uL — ABNORMAL HIGH (ref 4.0–10.5)
nRBC: 0.5 % — ABNORMAL HIGH (ref 0.0–0.2)

## 2020-03-31 LAB — CMP (CANCER CENTER ONLY)
ALT: 144 U/L — ABNORMAL HIGH (ref 0–44)
AST: 29 U/L (ref 15–41)
Albumin: 3.7 g/dL (ref 3.5–5.0)
Alkaline Phosphatase: 59 U/L (ref 38–126)
Anion gap: 12 (ref 5–15)
BUN: 14 mg/dL (ref 6–20)
CO2: 22 mmol/L (ref 22–32)
Calcium: 9.4 mg/dL (ref 8.9–10.3)
Chloride: 103 mmol/L (ref 98–111)
Creatinine: 0.93 mg/dL (ref 0.61–1.24)
GFR, Estimated: >60 mL/min (ref >60)
Glucose, Bld: 223 mg/dL — ABNORMAL HIGH (ref 70–99)
Potassium: 3.9 mmol/L (ref 3.5–5.1)
Sodium: 137 mmol/L (ref 135–145)
Total Bilirubin: 0.5 mg/dL (ref 0.3–1.2)
Total Protein: 7 g/dL (ref 6.5–8.1)

## 2020-03-31 LAB — TOTAL PROTEIN, URINE DIPSTICK: Protein, ur: NEGATIVE mg/dL

## 2020-03-31 LAB — TSH: TSH: 1.193 u[IU]/mL (ref 0.320–4.118)

## 2020-03-31 MED ORDER — SODIUM CHLORIDE 0.9% FLUSH
10.0000 mL | INTRAVENOUS | Status: DC | PRN
  Administered 2020-03-31: 10 mL
  Filled 2020-03-31: qty 10

## 2020-03-31 MED ORDER — SODIUM CHLORIDE 0.9 % IV SOLN: 200.0000 mg | Freq: Once | INTRAVENOUS | Status: DC

## 2020-03-31 MED ORDER — SODIUM CHLORIDE 0.9 % IV SOLN
Freq: Once | INTRAVENOUS | Status: AC
  Filled 2020-03-31: qty 250

## 2020-03-31 MED ORDER — HEPARIN SOD (PORK) LOCK FLUSH 100 UNIT/ML IV SOLN
500.0000 [IU] | Freq: Once | INTRAVENOUS | Status: AC | PRN
  Administered 2020-03-31: 500 [IU]
  Filled 2020-03-31: qty 5

## 2020-03-31 MED ORDER — SODIUM CHLORIDE 0.9 % IV SOLN: 15.0000 mg/kg | Freq: Once | INTRAVENOUS | Status: DC

## 2020-03-31 NOTE — Patient Instructions (Signed)
Bevacizumab injection What is this medicine? BEVACIZUMAB (be va SIZ yoo mab) is a monoclonal antibody. It is used to treat many types of cancer. This medicine may be used for other purposes; ask your health care provider or pharmacist if you have questions. COMMON BRAND NAME(S): Avastin, MVASI, Zirabev What should I tell my health care provider before I take this medicine? They need to know if you have any of these conditions:  diabetes  heart disease  high blood pressure  history of coughing up blood  prior anthracycline chemotherapy (e.g., doxorubicin, daunorubicin, epirubicin)  recent or ongoing radiation therapy  recent or planning to have surgery  stroke  an unusual or allergic reaction to bevacizumab, hamster proteins, mouse proteins, other medicines, foods, dyes, or preservatives  pregnant or trying to get pregnant  breast-feeding How should I use this medicine? This medicine is for infusion into a vein. It is given by a health care professional in a hospital or clinic setting. Talk to your pediatrician regarding the use of this medicine in children. Special care may be needed. Overdosage: If you think you have taken too much of this medicine contact a poison control center or emergency room at once. NOTE: This medicine is only for you. Do not share this medicine with others. What if I miss a dose? It is important not to miss your dose. Call your doctor or health care professional if you are unable to keep an appointment. What may interact with this medicine? Interactions are not expected. This list may not describe all possible interactions. Give your health care provider a list of all the medicines, herbs, non-prescription drugs, or dietary supplements you use. Also tell them if you smoke, drink alcohol, or use illegal drugs. Some items may interact with your medicine. What should I watch for while using this medicine? Your condition will be monitored carefully while  you are receiving this medicine. You will need important blood work and urine testing done while you are taking this medicine. This medicine may increase your risk to bruise or bleed. Call your doctor or health care professional if you notice any unusual bleeding. Before having surgery, talk to your health care provider to make sure it is ok. This drug can increase the risk of poor healing of your surgical site or wound. You will need to stop this drug for 28 days before surgery. After surgery, wait at least 28 days before restarting this drug. Make sure the surgical site or wound is healed enough before restarting this drug. Talk to your health care provider if questions. Do not become pregnant while taking this medicine or for 6 months after stopping it. Women should inform their doctor if they wish to become pregnant or think they might be pregnant. There is a potential for serious side effects to an unborn child. Talk to your health care professional or pharmacist for more information. Do not breast-feed an infant while taking this medicine and for 6 months after the last dose. This medicine has caused ovarian failure in some women. This medicine may interfere with the ability to have a child. You should talk to your doctor or health care professional if you are concerned about your fertility. What side effects may I notice from receiving this medicine? Side effects that you should report to your doctor or health care professional as soon as possible:  allergic reactions like skin rash, itching or hives, swelling of the face, lips, or tongue  chest pain or chest tightness  chills  coughing up blood  high fever  seizures  severe constipation  signs and symptoms of bleeding such as bloody or black, tarry stools; red or dark-brown urine; spitting up blood or brown material that looks like coffee grounds; red spots on the skin; unusual bruising or bleeding from the eye, gums, or nose  signs  and symptoms of a blood clot such as breathing problems; chest pain; severe, sudden headache; pain, swelling, warmth in the leg  signs and symptoms of a stroke like changes in vision; confusion; trouble speaking or understanding; severe headaches; sudden numbness or weakness of the face, arm or leg; trouble walking; dizziness; loss of balance or coordination  stomach pain  sweating  swelling of legs or ankles  vomiting  weight gain Side effects that usually do not require medical attention (report to your doctor or health care professional if they continue or are bothersome):  back pain  changes in taste  decreased appetite  dry skin  nausea  tiredness This list may not describe all possible side effects. Call your doctor for medical advice about side effects. You may report side effects to FDA at 1-800-FDA-1088. Where should I keep my medicine? This drug is given in a hospital or clinic and will not be stored at home. NOTE: This sheet is a summary. It may not cover all possible information. If you have questions about this medicine, talk to your doctor, pharmacist, or health care provider.  2020 Elsevier/Gold Standard (2019-02-06 10:50:46)

## 2020-03-31 NOTE — Progress Notes (Signed)
Patient informed that prior authorization for medications had not been completed, therefore we would have to reschedule patient's appointment. Patient verbalized an understanding and understood that he would be rescheduled sometime next week.  Patient discharged from the [facility] in stable condition and with no signs of acute distress.

## 2020-03-31 NOTE — Progress Notes (Signed)
Per Dr. Julien Nordmann, ok to treat with ALT 144.

## 2020-03-31 NOTE — Progress Notes (Signed)
[place] [phone]   Fax:([phone]  OFFICE PROGRESS NOTE  [name], NP [address] [name] [address] [place] [address]  DIAGNOSIS: Stage IV (T3, N0, M1C) non-small cell lung cancer, adenocarcinoma.The patient presented with a right lower lobe/infrahilar mass as well as a solitary brain metastasis in the left cerebellum. He was diagnosed in July 2021.  Molecular Biomarkers:  MSI-High DETECTED Pembrolizumab Atezolizumab, Avelumab, Cemiplimab, Dostarlimab, Durvalumab, Ipilimumab, Nivolumab  STK11Splice Site SNV 0.0% Everolimus, Temsirolimus Yes  [id number] 1.7% Binimetinib Yes  [id number] 0.4%  Niraparib, Olaparib, Rucaparib, Talazoparib, Tazemetostat Yes  PRIOR THERAPY:  1) SRS to the solitary brain metastasis under the care of Dr. Lisbeth Renshaw. Last treatment 11/14/19. 2) Weekly concurrent chemoradiation with carboplatin for an AUC of 2, paclitaxel 45 mg/m2.First dose expected on 11/25/2019. Status post 7 cycles, last dose was giving 01/06/2020 with partial response.   CURRENT THERAPY:  1)  Immunotherapy with Keytruda 200 mg IV every 3 weeks.  First dose February 10, 2020 for a patient with MSI high.  Status post 1 cycle. 2) Avastin 15 mg/KG every 3 weeks.  First dose today for the vasogenic edema of the brain.  INTERVAL HISTORY: Joseph Atkins 59 y.o. male returns to the clinic today for follow-up visit accompanied by his wife.  The patient is feeling fine today with no concerning complaints except for generalized fatigue and more on the right lower extremity which has improved a lot since the last visit.  He is currently on a tapered dose of Decadron 2 mg p.o. twice daily.  He denied having any current chest pain, shortness of breath except with exertion with no cough or hemoptysis.  He gained a lot of weight in the last few weeks because of the steroid treatment.  He denied having any fever or chills.  He has no  nausea, vomiting, diarrhea or constipation.  He denied having any headache or visual changes.  He is here today for evaluation before resuming his treatment with immunotherapy with Keytruda.  He is also expected to start the first dose of Avastin because of the vasogenic edema refractory to steroid treatment.  MEDICAL HISTORY: Past Medical History:  Diagnosis Date  . Cancer ([place])    lung cancer  . Diverticulosis   . GERD (gastroesophageal reflux disease)   . Hx of small bowel obstruction   . Hyperlipidemia   . Hypothyroidism   . IBS (irritable bowel syndrome)   . Substance abuse ([facility])    Alcoholic, Drug addition  . Thyroid disease     ALLERGIES:  is allergic to penicillins.  MEDICATIONS:  Current Outpatient Medications  Medication Sig Dispense Refill  . albuterol (PROVENTIL HFA;VENTOLIN HFA) 108 (90 Base) MCG/ACT inhaler Inhale 2 puffs into the lungs every 6 (six) hours as needed for wheezing or shortness of breath. 1 Inhaler 0  . ANORO ELLIPTA 62.5-25 MCG/INH AEPB 1 puff daily.    . cyclobenzaprine (FLEXERIL) 10 MG tablet Take 10 mg by mouth 2 (two) times daily as needed for muscle spasms. (Patient not taking: Reported on 02/03/2020)    . dexamethasone (DECADRON) 2 MG tablet Take 2 tablets (4 mg total) by mouth 2 (two) times daily. 120 tablet 2  . docusate sodium (COLACE) 100 MG capsule Take 100 mg by mouth daily.    . DULoxetine (CYMBALTA) 20 MG capsule Take 1 capsule (20 mg total) by mouth 2 (two) times daily. 60 capsule 5  . fluticasone furoate-vilanterol (BREO ELLIPTA)  100-25 MCG/INH AEPB Inhale 1 puff into the lungs daily. 30 each 3  . levothyroxine (SYNTHROID) 75 MCG tablet Take 75 mcg by mouth daily.    Marland Kitchen lidocaine (XYLOCAINE) 2 % solution Use as directed 15 mLs in the mouth or throat every 6 (six) hours as needed for mouth pain. Do not eat/drink within 60 minutes of taking this medication (Patient not taking: Reported on 02/20/2020) 100 mL 0  . lidocaine-prilocaine (EMLA)  cream Apply 1 application topically as needed. (Patient not taking: Reported on 03/10/2020) 30 g 0  . omeprazole (PRILOSEC) 40 MG capsule Take 1 capsule (40 mg total) by mouth daily. 90 capsule 4  . ondansetron (ZOFRAN) 4 MG tablet Take 1 tablet (4 mg total) by mouth every 8 (eight) hours as needed. 40 tablet 2  . oxyCODONE-acetaminophen (PERCOCET/ROXICET) 5-325 MG tablet Take 1 tablet by mouth every 4 (four) hours as needed for severe pain. (Patient not taking: Reported on 03/10/2020) 30 tablet 0  . polyethylene glycol powder (MIRALAX) powder Take 17 g by mouth daily. 255 g 11  . prochlorperazine (COMPAZINE) 10 MG tablet Take 1 tablet (10 mg total) by mouth every 6 (six) hours as needed. 30 tablet 2  . sucralfate (CARAFATE) 1 g tablet Take 1 tablet (1 g total) by mouth 4 (four) times daily. Dissolve each tablet in 15 cc water before use. (Patient not taking: Reported on 03/10/2020) 120 tablet 2  . TRULANCE 3 MG TABS Take 1 tablet by mouth daily.    Marland Kitchen zolpidem (AMBIEN CR) 12.5 MG CR tablet Take 1 tablet (12.5 mg total) by mouth at bedtime as needed for sleep. 30 tablet 5   No current facility-administered medications for this visit.    SURGICAL HISTORY:  Past Surgical History:  Procedure Laterality Date  . arm surgery Right   . BRONCHIAL BRUSHINGS  10/24/2019   Procedure: BRONCHIAL BRUSHINGS;  Surgeon: Collene Gobble, MD;  Location: [facility] ENDOSCOPY;  Service: Cardiopulmonary;;  right lower lobe   . BRONCHIAL BRUSHINGS  11/05/2019   Procedure: BRONCHIAL BRUSHINGS;  Surgeon: Collene Gobble, MD;  Location: [facility] ENDOSCOPY;  Service: Pulmonary;;  . BRONCHIAL NEEDLE ASPIRATION BIOPSY  10/24/2019   Procedure: BRONCHIAL NEEDLE ASPIRATION BIOPSIES;  Surgeon: Collene Gobble, MD;  Location: [place];  Service: Cardiopulmonary;;  . BRONCHIAL NEEDLE ASPIRATION BIOPSY  11/05/2019   Procedure: BRONCHIAL NEEDLE ASPIRATION BIOPSIES;  Surgeon: Collene Gobble, MD;  Location: [facility] ENDOSCOPY;  Service: Pulmonary;;   . ENDOBRONCHIAL ULTRASOUND N/A 10/24/2019   Procedure: ENDOBRONCHIAL ULTRASOUND;  Surgeon: Collene Gobble, MD;  Location: [place];  Service: Cardiopulmonary;  Laterality: N/A;  . FINGER SURGERY Right    Middle  . IR IMAGING GUIDED PORT INSERTION  11/19/2019  . VIDEO BRONCHOSCOPY N/A 10/24/2019   Procedure: VIDEO BRONCHOSCOPY WITHOUT FLUORO;  Surgeon: Collene Gobble, MD;  Location: [facility] ENDOSCOPY;  Service: Cardiopulmonary;  Laterality: N/A;  . VIDEO BRONCHOSCOPY WITH ENDOBRONCHIAL NAVIGATION N/A 11/05/2019   Procedure: VIDEO BRONCHOSCOPY WITH ENDOBRONCHIAL NAVIGATION;  Surgeon: Collene Gobble, MD;  Location: [place] ENDOSCOPY;  Service: Pulmonary;  Laterality: N/A;    REVIEW OF SYSTEMS:  Constitutional: positive for fatigue Eyes: negative Ears, nose, mouth, throat, and face: negative Respiratory: positive for dyspnea on exertion Cardiovascular: negative Gastrointestinal: negative Genitourinary:negative Integument/breast: negative Hematologic/lymphatic: negative Musculoskeletal:positive for muscle weakness Neurological: positive for gait problems and weakness Behavioral/Psych: negative Endocrine: negative Allergic/Immunologic: negative   PHYSICAL EXAMINATION: General appearance: alert, cooperative, fatigued and no distress Head: Normocephalic, without obvious abnormality, atraumatic Neck:  no adenopathy, no JVD, supple, symmetrical, trachea midline and thyroid not enlarged, symmetric, no tenderness/mass/nodules Lymph nodes: Cervical, supraclavicular, and axillary nodes normal. Resp: clear to auscultation bilaterally Back: symmetric, no curvature. ROM normal. No CVA tenderness. Cardio: regular rate and rhythm, S1, S2 normal, no murmur, click, rub or gallop GI: soft, non-tender; bowel sounds normal; no masses,  no organomegaly Extremities: extremities normal, atraumatic, no cyanosis or edema Neurologic: Motor: Weakness of the right lower extremity.  ECOG PERFORMANCE STATUS: 1 -  Symptomatic but completely ambulatory  Blood pressure (!) 142/87, pulse (!) 112, temperature 97.6 F (36.4 C), temperature source Tympanic, resp. rate 17, height $RemoveBe'5\' 8"'[id number]$  (1.727 m), weight 185 lb 1.6 oz (84 kg), SpO2 100 %.  LABORATORY DATA: Lab Results  Component Value Date   WBC 11.7 (H) 03/31/2020   HGB 13.9 03/31/2020   HCT 41.4 03/31/2020   MCV 102.0 (H) 03/31/2020   PLT 213 03/31/2020      Chemistry      Component Value Date/Time   NA 137 03/31/2020 0801   NA 141 03/08/2017 1707   K 3.9 03/31/2020 0801   CL 103 03/31/2020 0801   CO2 22 03/31/2020 0801   BUN 14 03/31/2020 0801   BUN 12 03/08/2017 1707   CREATININE 0.93 03/31/2020 0801      Component Value Date/Time   CALCIUM 9.4 03/31/2020 0801   ALKPHOS 59 03/31/2020 0801   AST 29 03/31/2020 0801   ALT 144 (H) 03/31/2020 0801   BILITOT 0.5 03/31/2020 0801       RADIOGRAPHIC STUDIES: No results found.  ASSESSMENT AND PLAN: This is a very pleasant 59 years old white male with a stage IV (t3, N0, M1c) non-small cell lung cancer, adenocarcinoma with MSI high presented with right lower lobe/infrahilar mass in addition to solitary brain metastasis in the left cerebellum diagnosed in July 2021. He is status post SRS to the solitary brain metastasis. The patient completed a course of concurrent chemoradiation with weekly carboplatin and paclitaxel.  He tolerated the treatment well except for fatigue and mild odynophagia. He had repeat CT scan of the chest performed recently.  I personally and independently reviewed the scans and discussed the results with the patient and his wife. His scan showed interval decrease in the volume of the right infrahilar mass with no other evidence of metastatic disease. The patient has MSI high and I recommended for him treatment with immunotherapy with single agent Keytruda 200 mg IV every 3 weeks for a total of 2 years unless the patient has unacceptable toxicity or disease progression. He  tolerated the first cycle of his treatment well with no concerning complaints. He was on a tapered dose of Decadron for the vasogenic edema.  He was on 2 mg p.o. twice daily until yesterday.  Starting today he will be on 2 mg p.o. daily.  His vasogenic edema was refractory to steroid and Dr. Mickeal Skinner recommended for the patient to start Avastin 15 mg/KG every 3 weeks for 2-3 doses. I recommended for the patient to resume his treatment with Keytruda and I already added the Avastin to his treatment. He will come back for follow-up visit in 3 weeks for evaluation before starting cycle #3 of his treatment with Keytruda. The patient was advised to call immediately if he has any concerning symptoms in the interval. The patient voices understanding of current disease status and treatment options and is in agreement with the current care plan.  All questions were answered. The patient knows  to call the clinic with any problems, questions or concerns. We can certainly see the patient much sooner if necessary.  Disclaimer: This note was dictated with voice recognition software. Similar sounding words can inadvertently be transcribed and may not be corrected upon review.

## 2020-03-31 NOTE — Telephone Encounter (Signed)
Scheduled apt per 12/7 sch msg - pt is aware of new appt date and time

## 2020-04-02 ENCOUNTER — Telehealth: Payer: Self-pay | Admitting: Internal Medicine

## 2020-04-02 NOTE — Telephone Encounter (Signed)
Scheduled per los. Called, not able to leave msg. Mailed printout  

## 2020-04-03 ENCOUNTER — Other Ambulatory Visit: Payer: Self-pay | Admitting: Radiation Oncology

## 2020-04-06 ENCOUNTER — Inpatient Hospital Stay: Payer: Managed Care, Other (non HMO)

## 2020-04-06 ENCOUNTER — Other Ambulatory Visit: Payer: Self-pay | Admitting: Internal Medicine

## 2020-04-06 ENCOUNTER — Other Ambulatory Visit: Payer: Self-pay

## 2020-04-06 VITALS — BP 150/88 | HR 88 | Temp 97.8°F | Resp 18 | Wt 190.2 lb

## 2020-04-06 DIAGNOSIS — C3491 Malignant neoplasm of unspecified part of right bronchus or lung: Secondary | ICD-10-CM

## 2020-04-06 LAB — CBC WITH DIFFERENTIAL (CANCER CENTER ONLY)
Abs Immature Granulocytes: 0.95 10*3/uL — ABNORMAL HIGH (ref 0.00–0.07)
Basophils Absolute: 0.2 10*3/uL — ABNORMAL HIGH (ref 0.0–0.1)
Basophils Relative: 2 %
Eosinophils Absolute: 0 10*3/uL (ref 0.0–0.5)
Eosinophils Relative: 0 %
HCT: 39.2 % (ref 39.0–52.0)
Hemoglobin: 13.1 g/dL (ref 13.0–17.0)
Immature Granulocytes: 9 %
Lymphocytes Relative: 14 %
Lymphs Abs: 1.5 10*3/uL (ref 0.7–4.0)
MCH: 34.4 pg — ABNORMAL HIGH (ref 26.0–34.0)
MCHC: 33.4 g/dL (ref 30.0–36.0)
MCV: 102.9 fL — ABNORMAL HIGH (ref 80.0–100.0)
Monocytes Absolute: 0.7 10*3/uL (ref 0.1–1.0)
Monocytes Relative: 7 %
Neutro Abs: 7.2 10*3/uL (ref 1.7–7.7)
Neutrophils Relative %: 68 %
Platelet Count: 241 10*3/uL (ref 150–400)
RBC: 3.81 MIL/uL — ABNORMAL LOW (ref 4.22–5.81)
RDW: 13.2 % (ref 11.5–15.5)
WBC Count: 10.6 10*3/uL — ABNORMAL HIGH (ref 4.0–10.5)
WBC Morphology: 1
nRBC: 0.8 % — ABNORMAL HIGH (ref 0.0–0.2)

## 2020-04-06 LAB — CMP (CANCER CENTER ONLY)
ALT: 85 U/L — ABNORMAL HIGH (ref 0–44)
AST: 25 U/L (ref 15–41)
Albumin: 3.7 g/dL (ref 3.5–5.0)
Alkaline Phosphatase: 58 U/L (ref 38–126)
Anion gap: 11 (ref 5–15)
BUN: 16 mg/dL (ref 6–20)
CO2: 25 mmol/L (ref 22–32)
Calcium: 9.5 mg/dL (ref 8.9–10.3)
Chloride: 104 mmol/L (ref 98–111)
Creatinine: 0.89 mg/dL (ref 0.61–1.24)
GFR, Estimated: >60 mL/min (ref >60)
Glucose, Bld: 116 mg/dL — ABNORMAL HIGH (ref 70–99)
Potassium: 4.1 mmol/L (ref 3.5–5.1)
Sodium: 140 mmol/L (ref 135–145)
Total Bilirubin: 0.4 mg/dL (ref 0.3–1.2)
Total Protein: 6.7 g/dL (ref 6.5–8.1)

## 2020-04-06 LAB — TSH: TSH: 0.494 u[IU]/mL (ref 0.320–4.118)

## 2020-04-06 MED ORDER — SODIUM CHLORIDE 0.9 % IV SOLN
200.0000 mg | Freq: Once | INTRAVENOUS | Status: AC
  Administered 2020-04-06: 200 mg via INTRAVENOUS
  Filled 2020-04-06: qty 8

## 2020-04-06 MED ORDER — SODIUM CHLORIDE 0.9 % IV SOLN
Freq: Once | INTRAVENOUS | Status: AC
  Filled 2020-04-06: qty 250

## 2020-04-06 MED ORDER — SODIUM CHLORIDE 0.9 % IV SOLN
15.0000 mg/kg | Freq: Once | INTRAVENOUS | Status: AC
  Administered 2020-04-06: 1200 mg via INTRAVENOUS
  Filled 2020-04-06: qty 48

## 2020-04-06 MED ORDER — HEPARIN SOD (PORK) LOCK FLUSH 100 UNIT/ML IV SOLN
500.0000 [IU] | Freq: Once | INTRAVENOUS | Status: AC | PRN
Start: 2020-04-06 — End: 2020-04-06
  Administered 2020-04-06: 500 [IU]
  Filled 2020-04-06: qty 5

## 2020-04-06 MED ORDER — SODIUM CHLORIDE 0.9% FLUSH
10.0000 mL | INTRAVENOUS | Status: DC | PRN
  Administered 2020-04-06: 10 mL
  Filled 2020-04-06: qty 10

## 2020-04-06 NOTE — Progress Notes (Signed)
Per Dr Julien Nordmann, ok to treat with ALT 85.  Per pharmacy, pembrolizumab to be administered, then bevacizumab.  Patient completed infusion without incident. In no visible distress at time of discharge. Ambulated out of cancer center. AVS provided.

## 2020-04-06 NOTE — Patient Instructions (Signed)
[facility] Discharge Instructions for Patients Receiving Chemotherapy  Today you received the following chemotherapy agents: bevacizumab/pembrolizumab.  To help prevent nausea and vomiting after your treatment, we encourage you to take your nausea medication as directed.   If you develop nausea and vomiting that is not controlled by your nausea medication, call the clinic.   BELOW ARE SYMPTOMS THAT SHOULD BE REPORTED IMMEDIATELY:  *FEVER GREATER THAN 100.5 F  *CHILLS WITH OR WITHOUT FEVER  NAUSEA AND VOMITING THAT IS NOT CONTROLLED WITH YOUR NAUSEA MEDICATION  *UNUSUAL SHORTNESS OF BREATH  *UNUSUAL BRUISING OR BLEEDING  TENDERNESS IN MOUTH AND THROAT WITH OR WITHOUT PRESENCE OF ULCERS  *URINARY PROBLEMS  *BOWEL PROBLEMS  UNUSUAL RASH Items with * indicate a potential emergency and should be followed up as soon as possible.  Feel free to call the clinic should you have any questions or concerns. The clinic phone number is [phone].  Please show the [facility] at check-in to the Emergency Department and triage nurse.

## 2020-04-06 NOTE — Progress Notes (Signed)
Ok for [employer] & Bevacizumab today.  Both are auth'd.  I d/w Dr. Julien Nordmann.  Kennith Center, Pharm.D., CPP 04/06/2020@9 :42 AM

## 2020-04-10 ENCOUNTER — Telehealth: Payer: Self-pay | Admitting: Internal Medicine

## 2020-04-10 NOTE — Telephone Encounter (Signed)
R/s appt per 12/17 sh[facility] msg - pt is aware of new appt on 1/4

## 2020-04-13 ENCOUNTER — Ambulatory Visit: Payer: Managed Care, Other (non HMO) | Admitting: Internal Medicine

## 2020-04-13 ENCOUNTER — Ambulatory Visit: Payer: Managed Care, Other (non HMO)

## 2020-04-13 ENCOUNTER — Other Ambulatory Visit: Payer: Managed Care, Other (non HMO)

## 2020-04-21 ENCOUNTER — Other Ambulatory Visit: Payer: Managed Care, Other (non HMO)

## 2020-04-21 ENCOUNTER — Ambulatory Visit: Payer: Managed Care, Other (non HMO) | Admitting: Internal Medicine

## 2020-04-21 ENCOUNTER — Ambulatory Visit: Payer: Managed Care, Other (non HMO)

## 2020-04-23 ENCOUNTER — Other Ambulatory Visit: Payer: Self-pay | Admitting: *Deleted

## 2020-04-23 DIAGNOSIS — C7931 Secondary malignant neoplasm of brain: Secondary | ICD-10-CM

## 2020-04-28 ENCOUNTER — Other Ambulatory Visit: Payer: Self-pay

## 2020-04-28 ENCOUNTER — Inpatient Hospital Stay: Payer: Managed Care, Other (non HMO)

## 2020-04-28 ENCOUNTER — Inpatient Hospital Stay: Payer: Managed Care, Other (non HMO) | Attending: Radiation Oncology

## 2020-04-28 ENCOUNTER — Inpatient Hospital Stay (HOSPITAL_BASED_OUTPATIENT_CLINIC_OR_DEPARTMENT_OTHER): Payer: Managed Care, Other (non HMO) | Admitting: Internal Medicine

## 2020-04-28 VITALS — BP 144/93 | HR 114 | Temp 97.7°F | Resp 18 | Ht 68.0 in | Wt 197.7 lb

## 2020-04-28 VITALS — HR 88

## 2020-04-28 DIAGNOSIS — C3431 Malignant neoplasm of lower lobe, right bronchus or lung: Secondary | ICD-10-CM | POA: Diagnosis not present

## 2020-04-28 DIAGNOSIS — K589 Irritable bowel syndrome without diarrhea: Secondary | ICD-10-CM | POA: Diagnosis not present

## 2020-04-28 DIAGNOSIS — C7931 Secondary malignant neoplasm of brain: Secondary | ICD-10-CM | POA: Insufficient documentation

## 2020-04-28 DIAGNOSIS — C3491 Malignant neoplasm of unspecified part of right bronchus or lung: Secondary | ICD-10-CM

## 2020-04-28 DIAGNOSIS — K219 Gastro-esophageal reflux disease without esophagitis: Secondary | ICD-10-CM | POA: Insufficient documentation

## 2020-04-28 DIAGNOSIS — Z5112 Encounter for antineoplastic immunotherapy: Secondary | ICD-10-CM | POA: Insufficient documentation

## 2020-04-28 DIAGNOSIS — I2510 Atherosclerotic heart disease of native coronary artery without angina pectoris: Secondary | ICD-10-CM | POA: Insufficient documentation

## 2020-04-28 DIAGNOSIS — G936 Cerebral edema: Secondary | ICD-10-CM | POA: Diagnosis not present

## 2020-04-28 DIAGNOSIS — E785 Hyperlipidemia, unspecified: Secondary | ICD-10-CM | POA: Insufficient documentation

## 2020-04-28 DIAGNOSIS — I700 Atherosclerosis of aorta: Secondary | ICD-10-CM | POA: Diagnosis not present

## 2020-04-28 DIAGNOSIS — E039 Hypothyroidism, unspecified: Secondary | ICD-10-CM | POA: Diagnosis not present

## 2020-04-28 DIAGNOSIS — Z79899 Other long term (current) drug therapy: Secondary | ICD-10-CM | POA: Diagnosis not present

## 2020-04-28 DIAGNOSIS — I10 Essential (primary) hypertension: Secondary | ICD-10-CM | POA: Diagnosis not present

## 2020-04-28 DIAGNOSIS — C3490 Malignant neoplasm of unspecified part of unspecified bronchus or lung: Secondary | ICD-10-CM

## 2020-04-28 DIAGNOSIS — Z95828 Presence of other vascular implants and grafts: Secondary | ICD-10-CM

## 2020-04-28 DIAGNOSIS — R0602 Shortness of breath: Secondary | ICD-10-CM | POA: Insufficient documentation

## 2020-04-28 LAB — CMP (CANCER CENTER ONLY)
ALT: 54 U/L — ABNORMAL HIGH (ref 0–44)
AST: 32 U/L (ref 15–41)
Albumin: 3.7 g/dL (ref 3.5–5.0)
Alkaline Phosphatase: 63 U/L (ref 38–126)
Anion gap: 10 (ref 5–15)
BUN: 13 mg/dL (ref 6–20)
CO2: 24 mmol/L (ref 22–32)
Calcium: 8.9 mg/dL (ref 8.9–10.3)
Chloride: 103 mmol/L (ref 98–111)
Creatinine: 0.82 mg/dL (ref 0.61–1.24)
GFR, Estimated: >60 mL/min (ref >60)
Glucose, Bld: 126 mg/dL — ABNORMAL HIGH (ref 70–99)
Potassium: 4.4 mmol/L (ref 3.5–5.1)
Sodium: 137 mmol/L (ref 135–145)
Total Bilirubin: 0.8 mg/dL (ref 0.3–1.2)
Total Protein: 7.2 g/dL (ref 6.5–8.1)

## 2020-04-28 LAB — CBC WITH DIFFERENTIAL (CANCER CENTER ONLY)
Abs Immature Granulocytes: 0.74 10*3/uL — ABNORMAL HIGH (ref 0.00–0.07)
Basophils Absolute: 0.1 10*3/uL (ref 0.0–0.1)
Basophils Relative: 1 %
Eosinophils Absolute: 0 10*3/uL (ref 0.0–0.5)
Eosinophils Relative: 0 %
HCT: 41.3 % (ref 39.0–52.0)
Hemoglobin: 13.5 g/dL (ref 13.0–17.0)
Immature Granulocytes: 6 %
Lymphocytes Relative: 7 %
Lymphs Abs: 0.9 10*3/uL (ref 0.7–4.0)
MCH: 33.1 pg (ref 26.0–34.0)
MCHC: 32.7 g/dL (ref 30.0–36.0)
MCV: 101.2 fL — ABNORMAL HIGH (ref 80.0–100.0)
Monocytes Absolute: 0.8 10*3/uL (ref 0.1–1.0)
Monocytes Relative: 6 %
Neutro Abs: 10.2 10*3/uL — ABNORMAL HIGH (ref 1.7–7.7)
Neutrophils Relative %: 80 %
Platelet Count: 202 10*3/uL (ref 150–400)
RBC: 4.08 MIL/uL — ABNORMAL LOW (ref 4.22–5.81)
RDW: 13.3 % (ref 11.5–15.5)
WBC Count: 12.7 10*3/uL — ABNORMAL HIGH (ref 4.0–10.5)
nRBC: 0.6 % — ABNORMAL HIGH (ref 0.0–0.2)

## 2020-04-28 LAB — TOTAL PROTEIN, URINE DIPSTICK: Protein, ur: NEGATIVE mg/dL

## 2020-04-28 LAB — TSH: TSH: 0.487 u[IU]/mL (ref 0.320–4.118)

## 2020-04-28 MED ORDER — HEPARIN SOD (PORK) LOCK FLUSH 100 UNIT/ML IV SOLN
500.0000 [IU] | Freq: Once | INTRAVENOUS | Status: AC | PRN
  Administered 2020-04-28: 500 [IU]
  Filled 2020-04-28: qty 5

## 2020-04-28 MED ORDER — PEMBROLIZUMAB CHEMO INJECTION 100 MG/4ML
200.0000 mg | Freq: Once | INTRAVENOUS | Status: AC
  Administered 2020-04-28: 200 mg via INTRAVENOUS
  Filled 2020-04-28: qty 8

## 2020-04-28 MED ORDER — SODIUM CHLORIDE 0.9% FLUSH
10.0000 mL | INTRAVENOUS | Status: DC | PRN
  Administered 2020-04-28: 10 mL
  Filled 2020-04-28: qty 10

## 2020-04-28 MED ORDER — SODIUM CHLORIDE 0.9 % IV SOLN
15.0000 mg/kg | Freq: Once | INTRAVENOUS | Status: AC
  Administered 2020-04-28: 1200 mg via INTRAVENOUS
  Filled 2020-04-28: qty 48

## 2020-04-28 MED ORDER — SODIUM CHLORIDE 0.9 % IV SOLN
Freq: Once | INTRAVENOUS | Status: AC
  Filled 2020-04-28: qty 250

## 2020-04-28 MED ORDER — SODIUM CHLORIDE 0.9% FLUSH
10.0000 mL | Freq: Once | INTRAVENOUS | Status: AC
  Administered 2020-04-28: 10 mL
  Filled 2020-04-28: qty 10

## 2020-04-28 NOTE — Progress Notes (Signed)
[place] [phone]   [phone]) [phone]  OFFICE PROGRESS NOTE  [name], NP [address] [name] [address] [place] [address]  DIAGNOSIS: Stage IV (T3, N0, M1C) non-small cell lung cancer, adenocarcinoma.The patient presented with a right lower lobe/infrahilar mass as well as a solitary brain metastasis in the left cerebellum. He was diagnosed in July 2021.  Molecular Biomarkers:  MSI-High DETECTED Pembrolizumab Atezolizumab, Avelumab, Cemiplimab, Dostarlimab, Durvalumab, Ipilimumab, Nivolumab  STK11Splice Site SNV 2.3% Everolimus, Temsirolimus Yes  [id number] 1.7% Binimetinib Yes  [id number] 0.4%  Niraparib, Olaparib, Rucaparib, Talazoparib, Tazemetostat Yes  PRIOR THERAPY:  1) SRS to the solitary brain metastasis under the care of Dr. Lisbeth Renshaw. Last treatment 11/14/19. 2) Weekly concurrent chemoradiation with carboplatin for an AUC of 2, paclitaxel 45 mg/m2.First dose expected on 11/25/2019. Status post 7 cycles, last dose was giving 01/06/2020 with partial response.   CURRENT THERAPY:  1)  Immunotherapy with Keytruda 200 mg IV every 3 weeks.  First dose February 10, 2020 for a patient with MSI high.  Status post 2 cycles. 2) Avastin 15 mg/KG every 3 weeks.  First dose today for the vasogenic edema of the brain.S/P 1 cycle.  INTERVAL HISTORY: Joseph Atkins 60 y.o. male returns to the clinic today for follow-up visit accompanied by his wife.  The patient is feeling fine today with no concerning complaints except for the weight gain and puffy face from the steroid treatment.  He is currently on Decadron 4 mg p.o. daily.  He denied having any current chest pain but continues to have shortness of breath with exertion with no cough or hemoptysis.  He denied having any fever or chills.  He has no nausea, vomiting, diarrhea or constipation.  He continues to have some numbness in the left foot.  The patient is here today for  evaluation before starting cycle #3 of his treatment with pembrolizumab and second cycle of Avastin.  MEDICAL HISTORY: Past Medical History:  Diagnosis Date  . Cancer ([place])    lung cancer  . Diverticulosis   . GERD (gastroesophageal reflux disease)   . Hx of small bowel obstruction   . Hyperlipidemia   . Hypothyroidism   . IBS (irritable bowel syndrome)   . Substance abuse ([facility])    Alcoholic, Drug addition  . Thyroid disease     ALLERGIES:  is allergic to penicillins.  MEDICATIONS:  Current Outpatient Medications  Medication Sig Dispense Refill  . albuterol (PROVENTIL HFA;VENTOLIN HFA) 108 (90 Base) MCG/ACT inhaler Inhale 2 puffs into the lungs every 6 (six) hours as needed for wheezing or shortness of breath. 1 Inhaler 0  . ANORO ELLIPTA 62.5-25 MCG/INH AEPB 1 puff daily.    . cyclobenzaprine (FLEXERIL) 10 MG tablet Take 10 mg by mouth 2 (two) times daily as needed for muscle spasms. (Patient not taking: Reported on 02/03/2020)    . dexamethasone (DECADRON) 2 MG tablet Take 2 tablets (4 mg total) by mouth 2 (two) times daily. 120 tablet 2  . docusate sodium (COLACE) 100 MG capsule Take 100 mg by mouth daily.    . DULoxetine (CYMBALTA) 20 MG capsule Take 1 capsule (20 mg total) by mouth 2 (two) times daily. 60 capsule 5  . fluticasone furoate-vilanterol (BREO ELLIPTA) 100-25 MCG/INH AEPB Inhale 1 puff into the lungs daily. 30 each 3  . levothyroxine (SYNTHROID) 75 MCG tablet Take 75 mcg by mouth daily.    Marland Kitchen lidocaine (XYLOCAINE) 2 % solution Use as  directed 15 mLs in the mouth or throat every 6 (six) hours as needed for mouth pain. Do not eat/drink within 60 minutes of taking this medication (Patient not taking: Reported on 02/20/2020) 100 mL 0  . lidocaine-prilocaine (EMLA) cream Apply 1 application topically as needed. (Patient not taking: Reported on 03/10/2020) 30 g 0  . omeprazole (PRILOSEC) 40 MG capsule Take 1 capsule (40 mg total) by mouth daily. 90 capsule 4  . ondansetron  (ZOFRAN) 4 MG tablet Take 1 tablet (4 mg total) by mouth every 8 (eight) hours as needed. 40 tablet 2  . oxyCODONE-acetaminophen (PERCOCET/ROXICET) 5-325 MG tablet Take 1 tablet by mouth every 4 (four) hours as needed for severe pain. (Patient not taking: Reported on 03/10/2020) 30 tablet 0  . polyethylene glycol powder (MIRALAX) powder Take 17 g by mouth daily. 255 g 11  . prochlorperazine (COMPAZINE) 10 MG tablet Take 1 tablet (10 mg total) by mouth every 6 (six) hours as needed. 30 tablet 2  . sucralfate (CARAFATE) 1 g tablet Take 1 tablet (1 g total) by mouth 4 (four) times daily. Dissolve each tablet in 15 cc water before use. (Patient not taking: Reported on 03/10/2020) 120 tablet 2  . TRULANCE 3 MG TABS Take 1 tablet by mouth daily.    Marland Kitchen zolpidem (AMBIEN CR) 12.5 MG CR tablet Take 1 tablet (12.5 mg total) by mouth at bedtime as needed for sleep. 30 tablet 5   No current facility-administered medications for this visit.    SURGICAL HISTORY:  Past Surgical History:  Procedure Laterality Date  . arm surgery Right   . BRONCHIAL BRUSHINGS  10/24/2019   Procedure: BRONCHIAL BRUSHINGS;  Surgeon: Collene Gobble, MD;  Location: [facility] ENDOSCOPY;  Service: Cardiopulmonary;;  right lower lobe   . BRONCHIAL BRUSHINGS  11/05/2019   Procedure: BRONCHIAL BRUSHINGS;  Surgeon: Collene Gobble, MD;  [facility] ENDOSCOPY;  Service: Pulmonary;;  . BRONCHIAL NEEDLE ASPIRATION BIOPSY  10/24/2019   Procedure: BRONCHIAL NEEDLE ASPIRATION BIOPSIES;  Surgeon: Collene Gobble, MD;  Location: [place];  Service: Cardiopulmonary;;  . BRONCHIAL NEEDLE ASPIRATION BIOPSY  11/05/2019   Procedure: BRONCHIAL NEEDLE ASPIRATION BIOPSIES;  Surgeon: Collene Gobble, MD;  Location: [facility] ENDOSCOPY;  Service: Pulmonary;;  . ENDOBRONCHIAL ULTRASOUND N/A 10/24/2019   Procedure: ENDOBRONCHIAL ULTRASOUND;  Surgeon: Collene Gobble, MD;  Location: [place];  Service: Cardiopulmonary;  Laterality: N/A;  . FINGER SURGERY Right     Middle  . IR IMAGING GUIDED PORT INSERTION  11/19/2019  . VIDEO BRONCHOSCOPY N/A 10/24/2019   Procedure: VIDEO BRONCHOSCOPY WITHOUT FLUORO;  Surgeon: Collene Gobble, MD;  Location: [facility] ENDOSCOPY;  Service: Cardiopulmonary;  Laterality: N/A;  . VIDEO BRONCHOSCOPY WITH ENDOBRONCHIAL NAVIGATION N/A 11/05/2019   Procedure: VIDEO BRONCHOSCOPY WITH ENDOBRONCHIAL NAVIGATION;  Surgeon: Collene Gobble, MD;  Location: [facility] ENDOSCOPY;  Service: Pulmonary;  Laterality: N/A;    REVIEW OF SYSTEMS:  A comprehensive review of systems was negative except for: Constitutional: positive for fatigue Respiratory: positive for dyspnea on exertion   PHYSICAL EXAMINATION: General appearance: alert, cooperative, fatigued and no distress Head: Normocephalic, without obvious abnormality, atraumatic Neck: no adenopathy, no JVD, supple, symmetrical, trachea midline and thyroid not enlarged, symmetric, no tenderness/mass/nodules Lymph nodes: Cervical, supraclavicular, and axillary nodes normal. Resp: clear to auscultation bilaterally Back: symmetric, no curvature. ROM normal. No CVA tenderness. Cardio: regular rate and rhythm, S1, S2 normal, no murmur, click, rub or gallop GI: soft, non-tender; bowel sounds normal; no masses,  no organomegaly Extremities:  extremities normal, atraumatic, no cyanosis or edema  ECOG PERFORMANCE STATUS: 1 - Symptomatic but completely ambulatory  Blood pressure (!) 144/93, pulse (!) 114, temperature 97.7 F (36.5 C), temperature source Tympanic, resp. rate 18, height 5' 8" (1.727 m), weight 197 lb 11.2 oz (89.7 kg), SpO2 100 %.  LABORATORY DATA: Lab Results  Component Value Date   WBC 10.6 (H) 04/06/2020   HGB 13.1 04/06/2020   HCT 39.2 04/06/2020   MCV 102.9 (H) 04/06/2020   PLT 241 04/06/2020      Chemistry      Component Value Date/Time   NA 140 04/06/2020 0822   NA 141 03/08/2017 1707   K 4.1 04/06/2020 0822   CL 104 04/06/2020 0822   CO2 25 04/06/2020 0822   BUN 16  04/06/2020 0822   BUN 12 03/08/2017 1707   CREATININE 0.89 04/06/2020 0822      Component Value Date/Time   CALCIUM 9.5 04/06/2020 0822   ALKPHOS 58 04/06/2020 0822   AST 25 04/06/2020 0822   ALT 85 (H) 04/06/2020 0822   BILITOT 0.4 04/06/2020 6629       RADIOGRAPHIC STUDIES: No results found.  ASSESSMENT AND PLAN: This is a very pleasant 60 years old white male with a stage IV (t3, N0, M1c) non-small cell lung cancer, adenocarcinoma with MSI high presented with right lower lobe/infrahilar mass in addition to solitary brain metastasis in the left cerebellum diagnosed in July 2021. He is status post SRS to the solitary brain metastasis. The patient completed a course of concurrent chemoradiation with weekly carboplatin and paclitaxel.  He tolerated the treatment well except for fatigue and mild odynophagia. He had repeat CT scan of the chest performed recently.  I personally and independently reviewed the scans and discussed the results with the patient and his wife. His scan showed interval decrease in the volume of the right infrahilar mass with no other evidence of metastatic disease. The patient has MSI high and I recommended for him treatment with immunotherapy with single agent Keytruda 200 mg IV every 3 weeks for a total of 2 years unless the patient has unacceptable toxicity or disease progression. He is status post 2 cycles of treatment with Keytruda.  He also received 1 cycle of Avastin for the vasogenic edema in the brain.  The patient tolerated the previous treatment well. I recommended for him to proceed with cycle #3 of Keytruda and the second cycle of Avastin today. Regarding the Decadron, I advised him to decrease his dose of Decadron to 2 mg p.o. daily. He will come back for follow-up visit in 3 weeks for evaluation with repeat CT scan of the chest, abdomen pelvis for restaging of his disease. For the brain metastasis he is followed by Dr. Mickeal Skinner. The patient was advised  to call immediately if he has any concerning symptoms in the interval. The patient voices understanding of current disease status and treatment options and is in agreement with the current care plan.  All questions were answered. The patient knows to call the clinic with any problems, questions or concerns. We can certainly see the patient much sooner if necessary.  Disclaimer: This note was dictated with voice recognition software. Similar sounding words can inadvertently be transcribed and may not be corrected upon review.

## 2020-04-28 NOTE — Patient Instructions (Signed)
[facility] Discharge Instructions for Patients Receiving Chemotherapy  Today you received the following chemotherapy agents: Pembrolizumab (Keytruda) and Bevacizumab (Avastin)  To help prevent nausea and vomiting after your treatment, we encourage you to take your nausea medication  as prescribed.    If you develop nausea and vomiting that is not controlled by your nausea medication, call the clinic.   BELOW ARE SYMPTOMS THAT SHOULD BE REPORTED IMMEDIATELY:  *FEVER GREATER THAN 100.5 F  *CHILLS WITH OR WITHOUT FEVER  NAUSEA AND VOMITING THAT IS NOT CONTROLLED WITH YOUR NAUSEA MEDICATION  *UNUSUAL SHORTNESS OF BREATH  *UNUSUAL BRUISING OR BLEEDING  TENDERNESS IN MOUTH AND THROAT WITH OR WITHOUT PRESENCE OF ULCERS  *URINARY PROBLEMS  *BOWEL PROBLEMS  UNUSUAL RASH Items with * indicate a potential emergency and should be followed up as soon as possible.  Feel free to call the clinic should you have any questions or concerns. The clinic phone number is [phone].  Please show the [facility] at check-in to the Emergency Department and triage nurse.

## 2020-04-28 NOTE — Progress Notes (Signed)
Per Dr. Julien Nordmann - okay to treat with elevated heart rate.

## 2020-04-30 ENCOUNTER — Telehealth: Payer: Self-pay | Admitting: Internal Medicine

## 2020-04-30 NOTE — Telephone Encounter (Signed)
Scheduled per 1/4 los. Pt will receive an updated appt calendar per next visit appt notes  

## 2020-05-12 ENCOUNTER — Ambulatory Visit: Payer: Managed Care, Other (non HMO)

## 2020-05-12 ENCOUNTER — Other Ambulatory Visit: Payer: Managed Care, Other (non HMO)

## 2020-05-12 ENCOUNTER — Ambulatory Visit: Payer: Managed Care, Other (non HMO) | Admitting: Internal Medicine

## 2020-05-15 ENCOUNTER — Other Ambulatory Visit: Payer: Self-pay

## 2020-05-15 ENCOUNTER — Ambulatory Visit (HOSPITAL_COMMUNITY)
Admission: RE | Admit: 2020-05-15 | Discharge: 2020-05-15 | Disposition: A | Discharge status: Home / Self Care | Payer: Managed Care, Other (non HMO) | Source: Ambulatory Visit | Attending: Internal Medicine | Admitting: Internal Medicine

## 2020-05-15 DIAGNOSIS — C3490 Malignant neoplasm of unspecified part of unspecified bronchus or lung: Secondary | ICD-10-CM

## 2020-05-15 IMAGING — CT CT ABD-PELV W/ CM
3 of 5 series · 13 of 36 positions shown, 16 images · IV contrast (OMNIPAQUE)
Comparison: Most recent CT chest [DATE]. [DATE] PET-CT. CT
chest, abdomen and pelvis [DATE].

CLINICAL DATA: Primary Cancer Type: Lung
TECHNIQUE: Multidetector CT imaging of the chest, abdomen and pelvis was
performed following the standard protocol during bolus
administration of intravenous contrast.

CONTRAST:  100mL OMNIPAQUE IOHEXOL 300 MG/ML  SOLN

[Series 2: cap with · axial · 0.90mm/px · z∈[-531,+4]mm · 9 of 135 slices shown, 12 images]
[im 14/135  mediastinal]
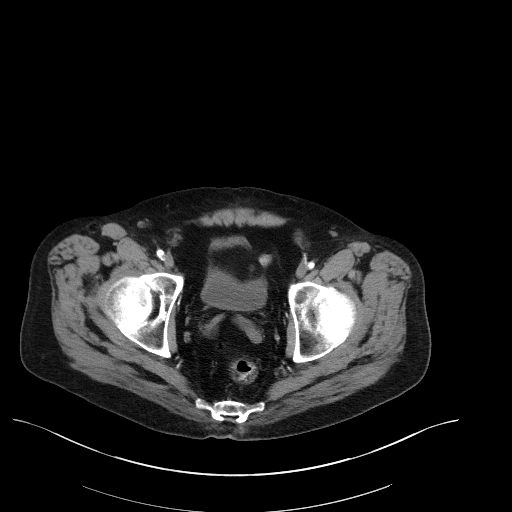
[im 14/135  lung]
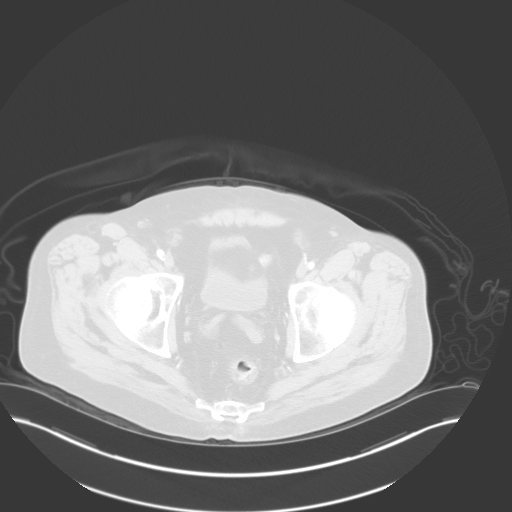
[im 27/135  lung]
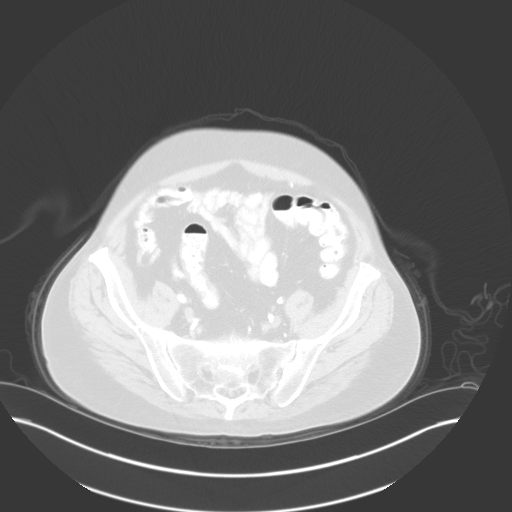
[im 41/135  lung]
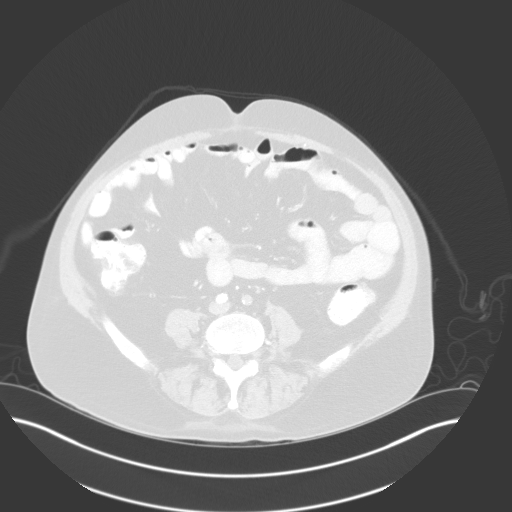
[im 54/135  lung]
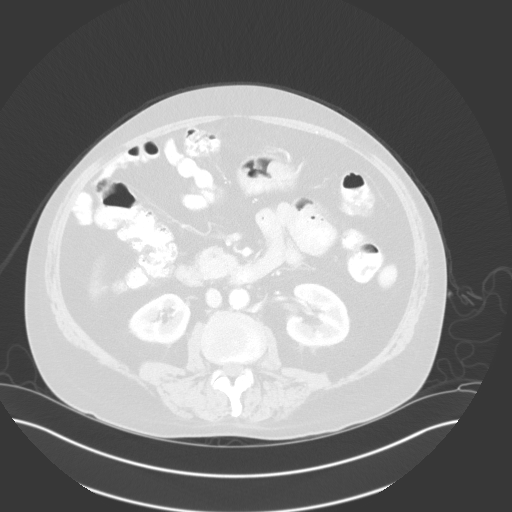
[im 68/135  mediastinal]
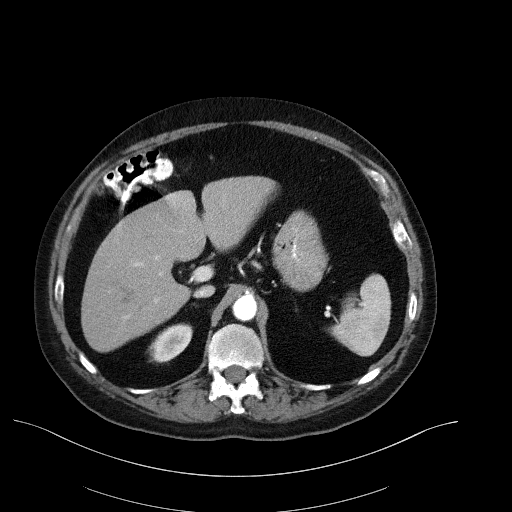
[im 68/135  lung]
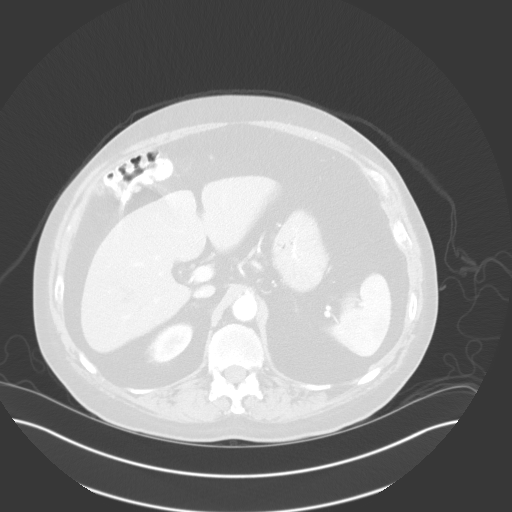
[im 81/135  lung]
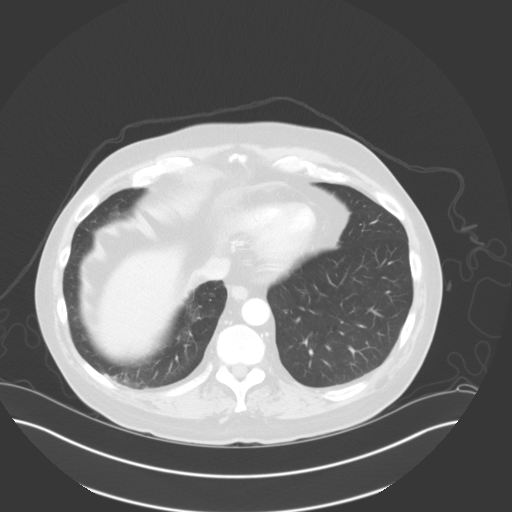
[im 94/135  lung]
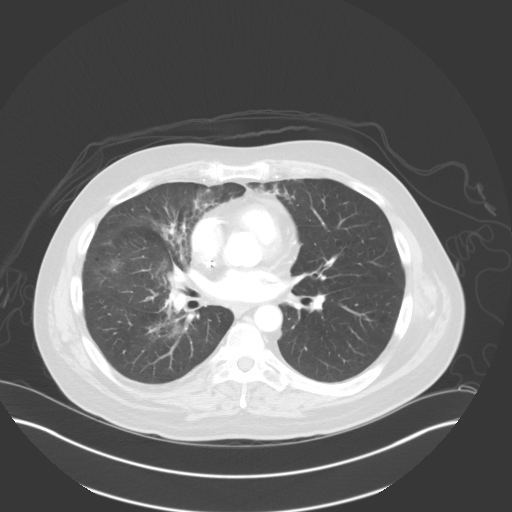
[im 108/135  lung]
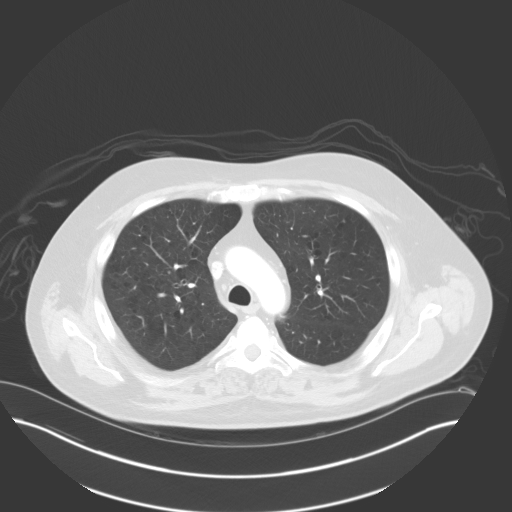
[im 121/135  mediastinal]
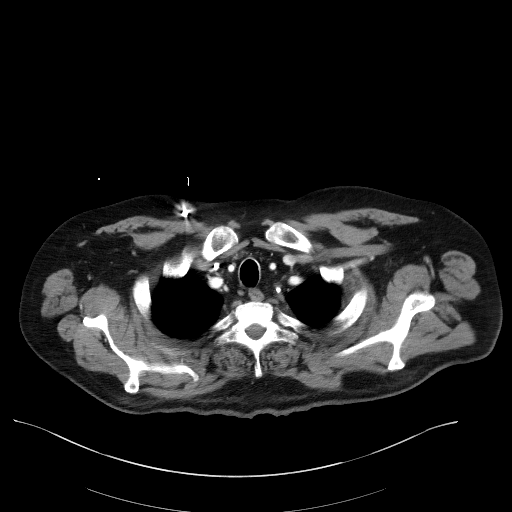
[im 121/135  lung]
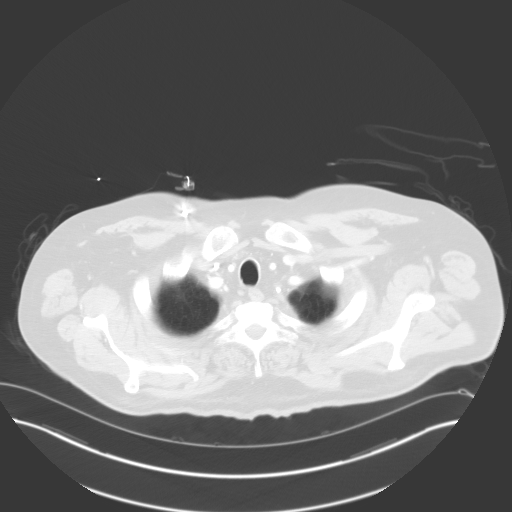

[Series 5: coronals · coronal · 1.10mm/px · 3 of 145 slices shown]
[im 29/145  lung]
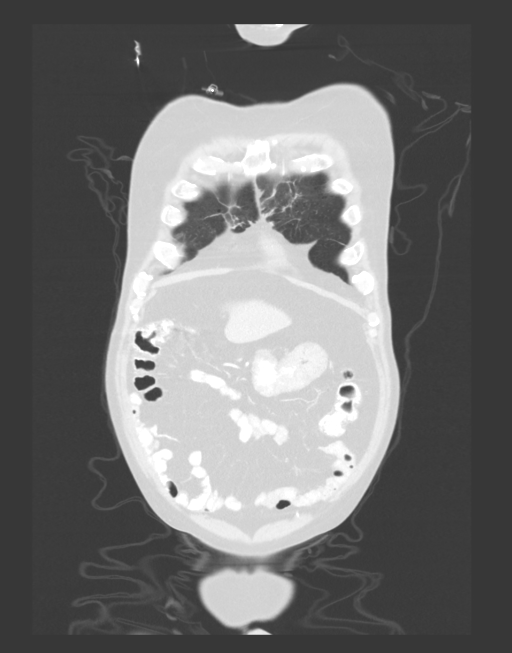
[im 58/145  lung]
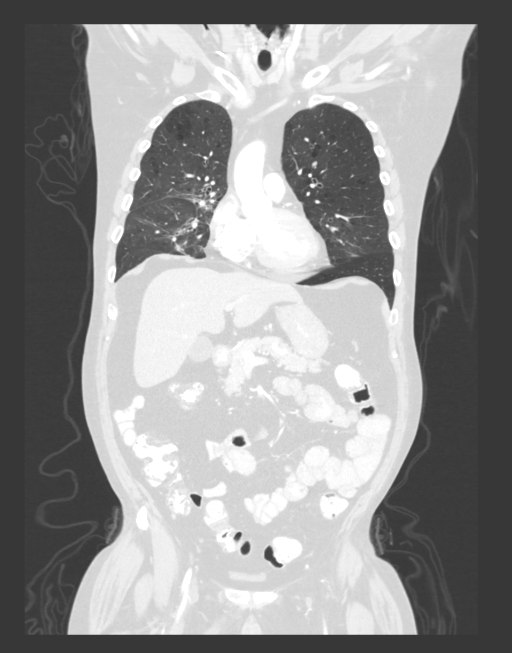
[im 87/145  lung]
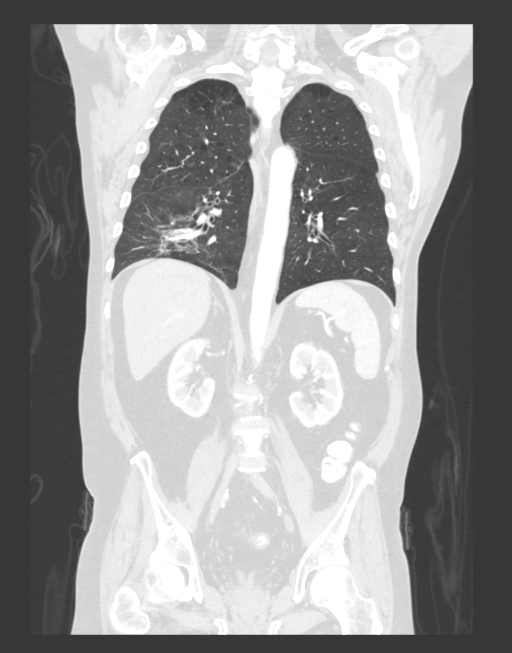

[Series 7: lung · axial · 0.90mm/px · 1 of 166 slices shown]
[im 13/166  lung]
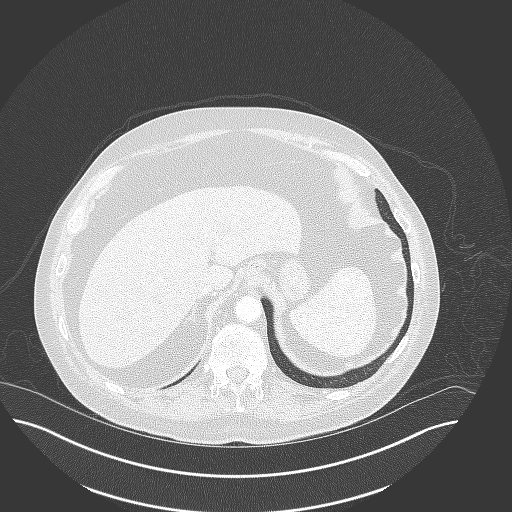

[13 of 36 positions shown; findings below may reference images not displayed]

Imaging Indication: Assess response to therapy

Interval therapy since last imaging? Yes

Initial Cancer Diagnosis

Date: [DATE]; Established by: Biopsy-proven

Detailed Pathology: Stage IV non-small cell lung cancer,
adenocarcinoma.

Primary Tumor location: Right lower lobe. Solitary brain metastasis.

Surgeries: No.

Chemotherapy: Yes; Ongoing? Yes; Most recent administration:
[DATE]

Immunotherapy?  Yes; Type: Keytruda; Ongoing? Yes

Radiation therapy? Yes

Date Range: [DATE]; Target: Brain metastasis

Date Range: [DATE] - [DATE]; Target: Right lung

Date Range: [DATE]; Target: Cerebellar metastasis

EXAM:
CT CHEST, ABDOMEN, AND PELVIS WITH CONTRAST
FINDINGS: CT CHEST FINDINGS

Cardiovascular: Right Port-A-Cath tip high right atrium. Aortic
atherosclerosis. Normal heart size, without pericardial effusion.
Lad coronary artery calcification. No central pulmonary embolism, on
this non-dedicated study.

Mediastinum/Nodes: No supraclavicular adenopathy. No mediastinal or
hilar adenopathy.

Lungs/Pleura: No pleural fluid.  Moderate centrilobular emphysema.

Central right lower lobe lung lesion measures 2.7 x 2.2 cm on 126/7.
Compare 3.3 x 2.4 cm on the prior exam. Adjacent or contiguous more
cephalad nodularity at 1.7 x 1.4 cm on 120/7 versus 2.2 x 12.0 cm on
the prior exam (when remeasured).

Multifocal right greater than left paramediastinal airspace and
ground-glass is presumably radiation induced, new.

New bilateral pulmonary nodules.

In the left upper lobe at 5 mm on 78/7, solid.

A left lower lobe cavitary nodule is new at 7 mm on 120/7.

Minimally cavitary nodulein the posterior right upper lobe at 7 mm
on 62/7.

2 mm left upper lobe pulmonary nodule on 62/7.

Musculoskeletal: No acute osseous abnormality.

CT ABDOMEN PELVIS FINDINGS

Hepatobiliary: Segment 2 hypoattenuation at 9 mm on 62/2, present
back to [1H] and considered benign. Normal gallbladder, without
biliary ductal dilatation.

Pancreas: Normal, without mass or ductal dilatation.

Spleen: Normal in size, without focal abnormality.

Adrenals/Urinary Tract: Normal adrenal glands. Normal kidneys,
without hydronephrosis. Normal urinary bladder.

Stomach/Bowel: Proximal gastric underdistention. Normal colon and
terminal ileum. Normal small bowel.

Vascular/Lymphatic: Advanced aortic and branch vessel
atherosclerosis. Left common and external iliac artery occlusion
with reconstitution by the left inferior epigastric. Retroaortic
left renal vein. No abdominopelvic adenopathy.

Reproductive: Normal prostate.

Other: No significant free fluid.

Musculoskeletal: No acute osseous abnormality.
IMPRESSION: 1. Since [DATE], decreased size of right lower lobe lung lesion.
2. New bilateral pulmonary nodules, highly suspicious for metastatic
disease.
3. No thoracic adenopathy.
4. No evidence of metastatic disease in the abdomen or pelvis.
5. Left common and external iliac artery occlusion with
reconstitution via distal collaterals. New since the most recent
abdominal CT of [DATE].
[DATE]. Aortic atherosclerosis ([1H]-[1H]), coronary artery
atherosclerosis and emphysema ([1H]-[1H]).
7.

## 2020-05-15 IMAGING — CT CT CHEST W/ CM
3 of 5 series · 13 of 36 positions shown, 16 images · IV contrast (OMNIPAQUE)
Comparison: Most recent CT chest [DATE]. [DATE] PET-CT. CT
chest, abdomen and pelvis [DATE].

CLINICAL DATA: Primary Cancer Type: Lung
TECHNIQUE: Multidetector CT imaging of the chest, abdomen and pelvis was
performed following the standard protocol during bolus
administration of intravenous contrast.

CONTRAST:  100mL OMNIPAQUE IOHEXOL 300 MG/ML  SOLN

[Series 2: cap with · axial · 0.90mm/px · z∈[-531,+4]mm · 9 of 135 slices shown, 12 images]
[im 14/135  mediastinal]
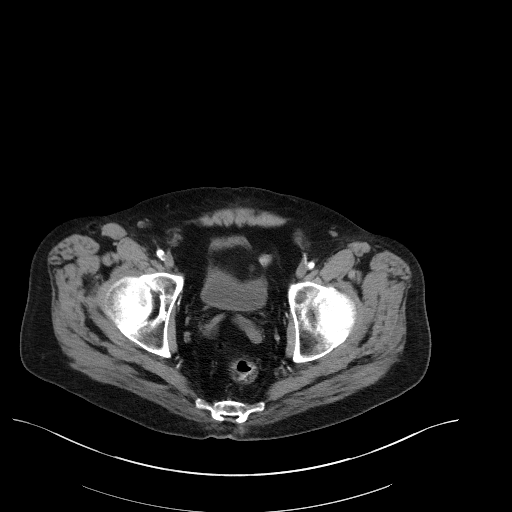
[im 14/135  lung]
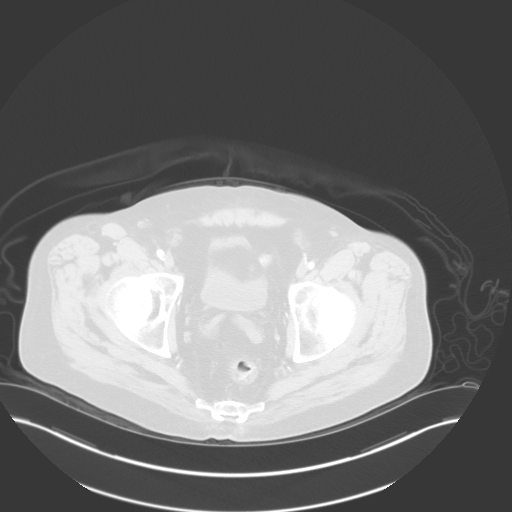
[im 27/135  lung]
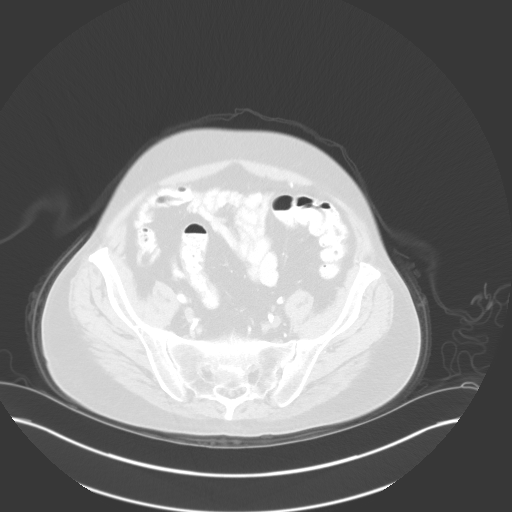
[im 41/135  lung]
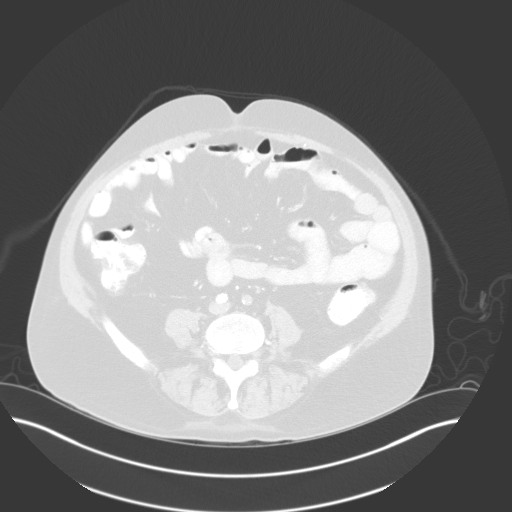
[im 54/135  lung]
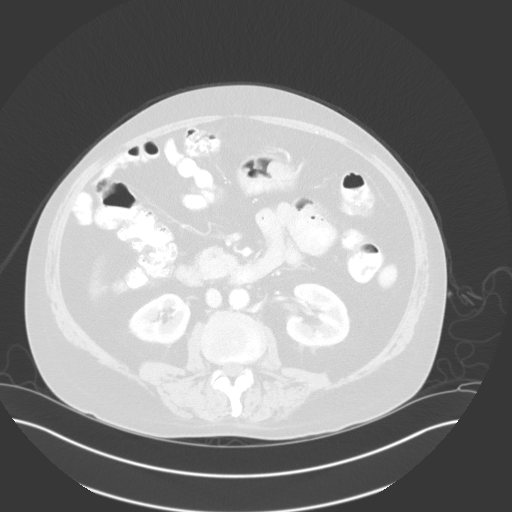
[im 68/135  mediastinal]
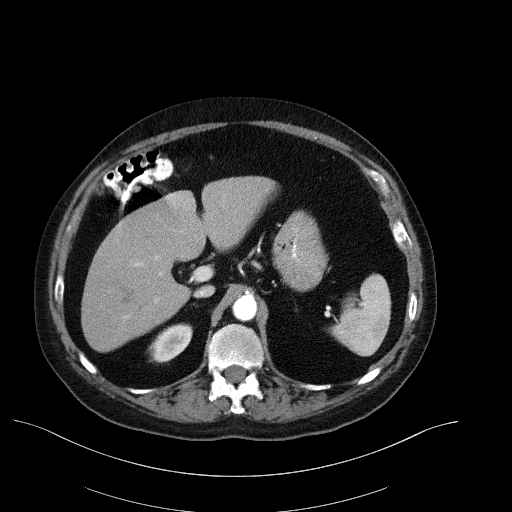
[im 68/135  lung]
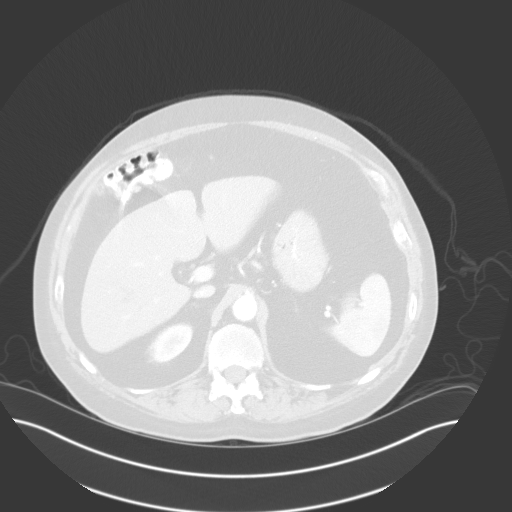
[im 81/135  lung]
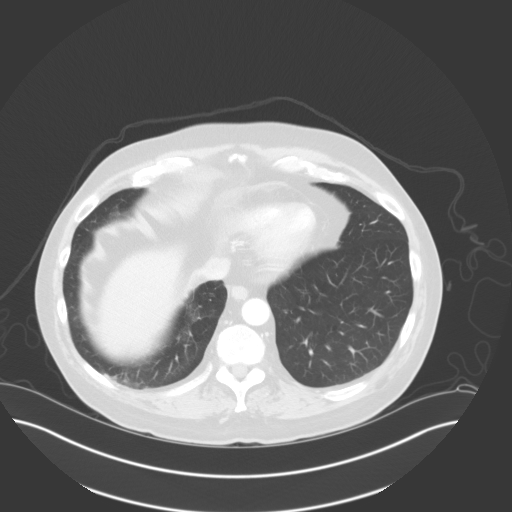
[im 94/135  lung]
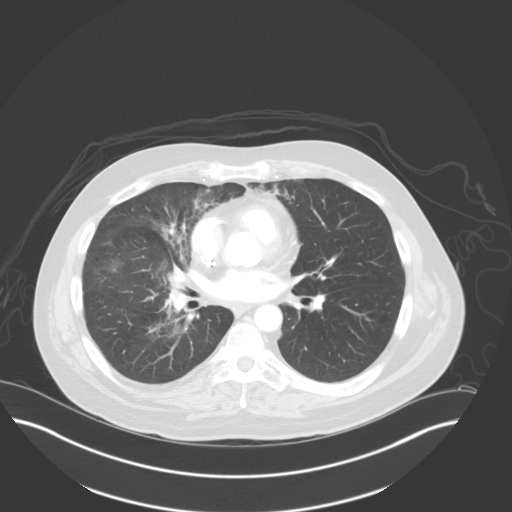
[im 108/135  lung]
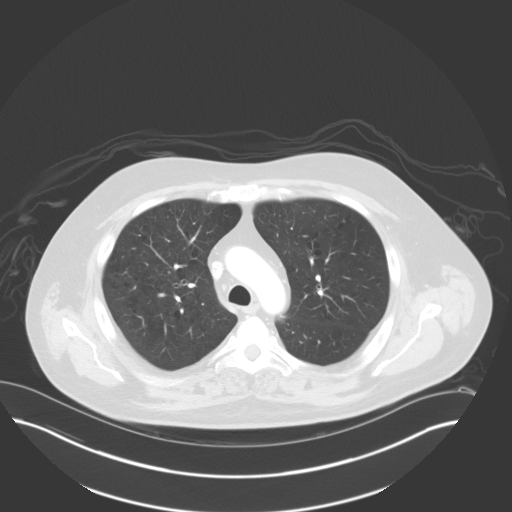
[im 121/135  mediastinal]
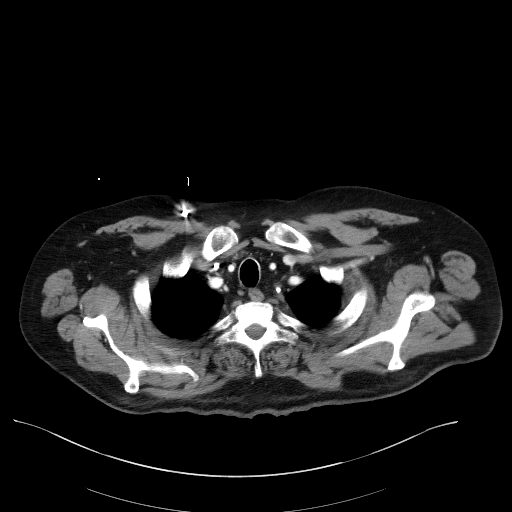
[im 121/135  lung]
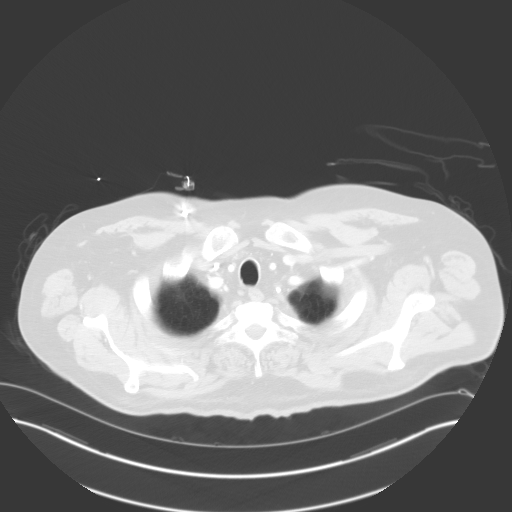

[Series 5: coronals · coronal · 1.10mm/px · 3 of 145 slices shown]
[im 29/145  lung]
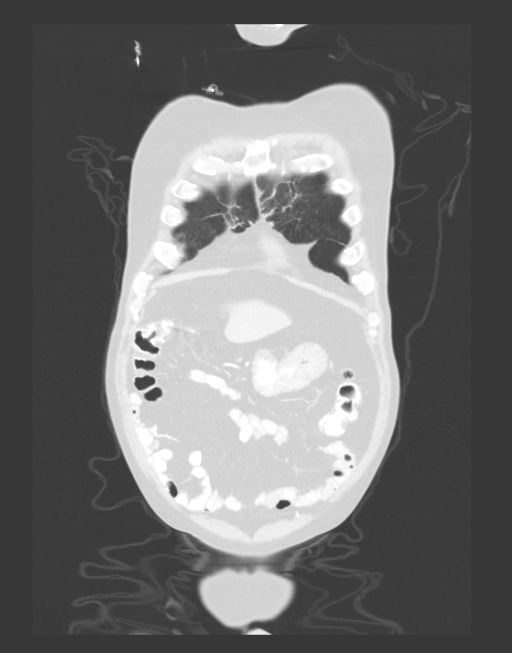
[im 58/145  lung]
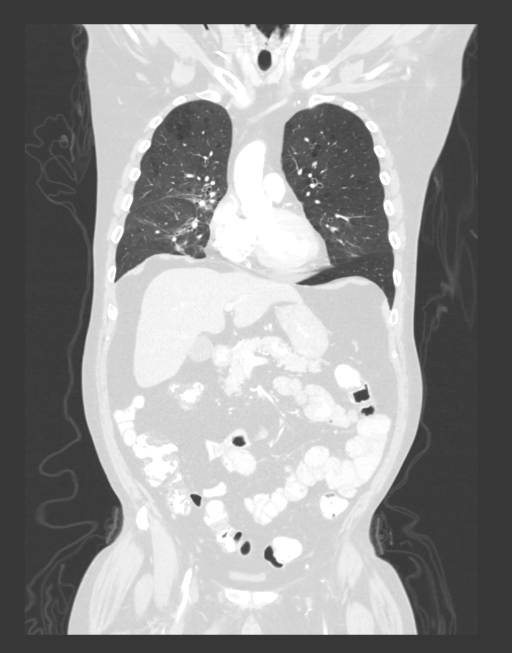
[im 87/145  lung]
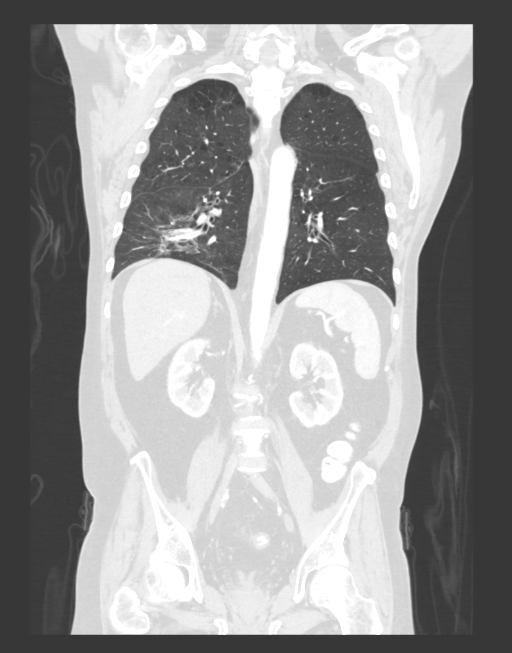

[Series 7: lung · axial · 0.90mm/px · 1 of 166 slices shown]
[im 13/166  lung]
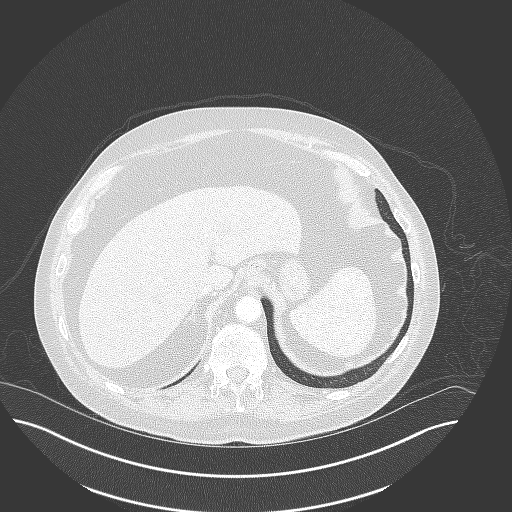

[13 of 36 positions shown; findings below may reference images not displayed]

Imaging Indication: Assess response to therapy

Interval therapy since last imaging? Yes

Initial Cancer Diagnosis

Date: [DATE]; Established by: Biopsy-proven

Detailed Pathology: Stage IV non-small cell lung cancer,
adenocarcinoma.

Primary Tumor location: Right lower lobe. Solitary brain metastasis.

Surgeries: No.

Chemotherapy: Yes; Ongoing? Yes; Most recent administration:
[DATE]

Immunotherapy?  Yes; Type: Keytruda; Ongoing? Yes

Radiation therapy? Yes

Date Range: [DATE]; Target: Brain metastasis

Date Range: [DATE] - [DATE]; Target: Right lung

Date Range: [DATE]; Target: Cerebellar metastasis

EXAM:
CT CHEST, ABDOMEN, AND PELVIS WITH CONTRAST
FINDINGS: CT CHEST FINDINGS

Cardiovascular: Right Port-A-Cath tip high right atrium. Aortic
atherosclerosis. Normal heart size, without pericardial effusion.
Lad coronary artery calcification. No central pulmonary embolism, on
this non-dedicated study.

Mediastinum/Nodes: No supraclavicular adenopathy. No mediastinal or
hilar adenopathy.

Lungs/Pleura: No pleural fluid.  Moderate centrilobular emphysema.

Central right lower lobe lung lesion measures 2.7 x 2.2 cm on 126/7.
Compare 3.3 x 2.4 cm on the prior exam. Adjacent or contiguous more
cephalad nodularity at 1.7 x 1.4 cm on 120/7 versus 2.2 x 12.0 cm on
the prior exam (when remeasured).

Multifocal right greater than left paramediastinal airspace and
ground-glass is presumably radiation induced, new.

New bilateral pulmonary nodules.

In the left upper lobe at 5 mm on 78/7, solid.

A left lower lobe cavitary nodule is new at 7 mm on 120/7.

Minimally cavitary nodulein the posterior right upper lobe at 7 mm
on 62/7.

2 mm left upper lobe pulmonary nodule on 62/7.

Musculoskeletal: No acute osseous abnormality.

CT ABDOMEN PELVIS FINDINGS

Hepatobiliary: Segment 2 hypoattenuation at 9 mm on 62/2, present
back to [1H] and considered benign. Normal gallbladder, without
biliary ductal dilatation.

Pancreas: Normal, without mass or ductal dilatation.

Spleen: Normal in size, without focal abnormality.

Adrenals/Urinary Tract: Normal adrenal glands. Normal kidneys,
without hydronephrosis. Normal urinary bladder.

Stomach/Bowel: Proximal gastric underdistention. Normal colon and
terminal ileum. Normal small bowel.

Vascular/Lymphatic: Advanced aortic and branch vessel
atherosclerosis. Left common and external iliac artery occlusion
with reconstitution by the left inferior epigastric. Retroaortic
left renal vein. No abdominopelvic adenopathy.

Reproductive: Normal prostate.

Other: No significant free fluid.

Musculoskeletal: No acute osseous abnormality.
IMPRESSION: 1. Since [DATE], decreased size of right lower lobe lung lesion.
2. New bilateral pulmonary nodules, highly suspicious for metastatic
disease.
3. No thoracic adenopathy.
4. No evidence of metastatic disease in the abdomen or pelvis.
5. Left common and external iliac artery occlusion with
reconstitution via distal collaterals. New since the most recent
abdominal CT of [DATE].
[DATE]. Aortic atherosclerosis ([1H]-[1H]), coronary artery
atherosclerosis and emphysema ([1H]-[1H]).
7.

## 2020-05-15 MED ORDER — IOHEXOL 300 MG/ML  SOLN
100.0000 mL | Freq: Once | INTRAMUSCULAR | Status: AC | PRN
  Administered 2020-05-15: 100 mL via INTRAVENOUS

## 2020-05-15 MED ORDER — HEPARIN SOD (PORK) LOCK FLUSH 100 UNIT/ML IV SOLN: 500.0000 [IU] | Freq: Once | INTRAVENOUS | Status: AC

## 2020-05-15 MED ORDER — HEPARIN SOD (PORK) LOCK FLUSH 100 UNIT/ML IV SOLN
INTRAVENOUS | Status: AC
  Administered 2020-05-15: 500 [IU] via INTRAVENOUS
  Filled 2020-05-15: qty 5

## 2020-05-19 ENCOUNTER — Other Ambulatory Visit: Payer: Self-pay

## 2020-05-19 ENCOUNTER — Inpatient Hospital Stay: Payer: Managed Care, Other (non HMO)

## 2020-05-19 ENCOUNTER — Inpatient Hospital Stay (HOSPITAL_BASED_OUTPATIENT_CLINIC_OR_DEPARTMENT_OTHER): Payer: Managed Care, Other (non HMO) | Admitting: Internal Medicine

## 2020-05-19 ENCOUNTER — Encounter: Payer: Self-pay | Admitting: Internal Medicine

## 2020-05-19 VITALS — BP 134/106 | HR 108 | Temp 97.5°F | Resp 18 | Ht 68.0 in | Wt 190.6 lb

## 2020-05-19 VITALS — BP 136/94 | HR 78

## 2020-05-19 DIAGNOSIS — C7931 Secondary malignant neoplasm of brain: Secondary | ICD-10-CM | POA: Diagnosis not present

## 2020-05-19 DIAGNOSIS — C3491 Malignant neoplasm of unspecified part of right bronchus or lung: Secondary | ICD-10-CM

## 2020-05-19 DIAGNOSIS — C3431 Malignant neoplasm of lower lobe, right bronchus or lung: Secondary | ICD-10-CM

## 2020-05-19 DIAGNOSIS — Z5112 Encounter for antineoplastic immunotherapy: Secondary | ICD-10-CM

## 2020-05-19 DIAGNOSIS — Z95828 Presence of other vascular implants and grafts: Secondary | ICD-10-CM

## 2020-05-19 DIAGNOSIS — I10 Essential (primary) hypertension: Secondary | ICD-10-CM

## 2020-05-19 LAB — CMP (CANCER CENTER ONLY)
ALT: 24 U/L (ref 0–44)
AST: 20 U/L (ref 15–41)
Albumin: 3.3 g/dL — ABNORMAL LOW (ref 3.5–5.0)
Alkaline Phosphatase: 50 U/L (ref 38–126)
Anion gap: 9 (ref 5–15)
BUN: 8 mg/dL (ref 6–20)
CO2: 24 mmol/L (ref 22–32)
Calcium: 8.9 mg/dL (ref 8.9–10.3)
Chloride: 107 mmol/L (ref 98–111)
Creatinine: 0.88 mg/dL (ref 0.61–1.24)
GFR, Estimated: >60 mL/min (ref >60)
Glucose, Bld: 147 mg/dL — ABNORMAL HIGH (ref 70–99)
Potassium: 4 mmol/L (ref 3.5–5.1)
Sodium: 140 mmol/L (ref 135–145)
Total Bilirubin: 0.5 mg/dL (ref 0.3–1.2)
Total Protein: 6.8 g/dL (ref 6.5–8.1)

## 2020-05-19 LAB — CBC WITH DIFFERENTIAL (CANCER CENTER ONLY)
Abs Immature Granulocytes: 0.22 10*3/uL — ABNORMAL HIGH (ref 0.00–0.07)
Basophils Absolute: 0.1 10*3/uL (ref 0.0–0.1)
Basophils Relative: 1 %
Eosinophils Absolute: 0 10*3/uL (ref 0.0–0.5)
Eosinophils Relative: 0 %
HCT: 37.1 % — ABNORMAL LOW (ref 39.0–52.0)
Hemoglobin: 11.6 g/dL — ABNORMAL LOW (ref 13.0–17.0)
Immature Granulocytes: 2 %
Lymphocytes Relative: 12 %
Lymphs Abs: 1.1 10*3/uL (ref 0.7–4.0)
MCH: 32.6 pg (ref 26.0–34.0)
MCHC: 31.3 g/dL (ref 30.0–36.0)
MCV: 104.2 fL — ABNORMAL HIGH (ref 80.0–100.0)
Monocytes Absolute: 0.6 10*3/uL (ref 0.1–1.0)
Monocytes Relative: 7 %
Neutro Abs: 7 10*3/uL (ref 1.7–7.7)
Neutrophils Relative %: 78 %
Platelet Count: 325 10*3/uL (ref 150–400)
RBC: 3.56 MIL/uL — ABNORMAL LOW (ref 4.22–5.81)
RDW: 14.3 % (ref 11.5–15.5)
WBC Count: 9 10*3/uL (ref 4.0–10.5)
nRBC: 0.8 % — ABNORMAL HIGH (ref 0.0–0.2)

## 2020-05-19 LAB — TSH: TSH: 1.531 u[IU]/mL (ref 0.320–4.118)

## 2020-05-19 MED ORDER — SODIUM CHLORIDE 0.9% FLUSH
10.0000 mL | Freq: Once | INTRAVENOUS | Status: AC
  Administered 2020-05-19: 10 mL
  Filled 2020-05-19: qty 10

## 2020-05-19 MED ORDER — SODIUM CHLORIDE 0.9% FLUSH
10.0000 mL | INTRAVENOUS | Status: DC | PRN
Start: 2020-05-19 — End: 2020-05-19
  Administered 2020-05-19: 10 mL
  Filled 2020-05-19: qty 10

## 2020-05-19 MED ORDER — SODIUM CHLORIDE 0.9 % IV SOLN
15.0000 mg/kg | Freq: Once | INTRAVENOUS | Status: AC
  Administered 2020-05-19: 1200 mg via INTRAVENOUS
  Filled 2020-05-19: qty 48

## 2020-05-19 MED ORDER — HEPARIN SOD (PORK) LOCK FLUSH 100 UNIT/ML IV SOLN
500.0000 [IU] | Freq: Once | INTRAVENOUS | Status: AC | PRN
  Administered 2020-05-19: 500 [IU]
  Filled 2020-05-19: qty 5

## 2020-05-19 MED ORDER — SODIUM CHLORIDE 0.9 % IV SOLN
Freq: Once | INTRAVENOUS | Status: AC
  Filled 2020-05-19: qty 250

## 2020-05-19 MED ORDER — SODIUM CHLORIDE 0.9 % IV SOLN
200.0000 mg | Freq: Once | INTRAVENOUS | Status: AC
  Administered 2020-05-19: 200 mg via INTRAVENOUS
  Filled 2020-05-19: qty 8

## 2020-05-19 NOTE — Progress Notes (Addendum)
Ok to treat-Per Dr Julien Nordmann it is okay to treat pt today with Keytruda and Avastin and BP of 134/106 and Heart rate of 108.

## 2020-05-19 NOTE — Patient Instructions (Signed)
[facility] Discharge Instructions for Patients Receiving Chemotherapy  Today you received the following chemotherapy agents: Pembrolizumab (Keytruda) and Bevacizumab (Avastin)  To help prevent nausea and vomiting after your treatment, we encourage you to take your nausea medication  as prescribed.    If you develop nausea and vomiting that is not controlled by your nausea medication, call the clinic.   BELOW ARE SYMPTOMS THAT SHOULD BE REPORTED IMMEDIATELY:  *FEVER GREATER THAN 100.5 F  *CHILLS WITH OR WITHOUT FEVER  NAUSEA AND VOMITING THAT IS NOT CONTROLLED WITH YOUR NAUSEA MEDICATION  *UNUSUAL SHORTNESS OF BREATH  *UNUSUAL BRUISING OR BLEEDING  TENDERNESS IN MOUTH AND THROAT WITH OR WITHOUT PRESENCE OF ULCERS  *URINARY PROBLEMS  *BOWEL PROBLEMS  UNUSUAL RASH Items with * indicate a potential emergency and should be followed up as soon as possible.  Feel free to call the clinic should you have any questions or concerns. The clinic phone number is [phone].  Please show the [facility] at check-in to the Emergency Department and triage nurse.

## 2020-05-19 NOTE — Patient Instructions (Signed)
Steps to Quit Smoking Smoking tobacco is the leading cause of preventable death. It can affect almost every organ in the body. Smoking puts you and people around you at risk for many serious, long-lasting (chronic) diseases. Quitting smoking can be hard, but it is one of the best things that you can do for your health. It is never too late to quit. How do I get ready to quit? When you decide to quit smoking, make a plan to help you succeed. Before you quit:  Pick a date to quit. Set a date within the next 2 weeks to give you time to prepare.  Write down the reasons why you are quitting. Keep this list in places where you will see it often.  Tell your family, friends, and co-workers that you are quitting. Their support is important.  Talk with your doctor about the choices that may help you quit.  Find out if your health insurance will pay for these treatments.  Know the people, places, things, and activities that make you want to smoke (triggers). Avoid them. What first steps can I take to quit smoking?  Throw away all cigarettes at home, at work, and in your car.  Throw away the things that you use when you smoke, such as ashtrays and lighters.  Clean your car. Make sure to empty the ashtray.  Clean your home, including curtains and carpets. What can I do to help me quit smoking? Talk with your doctor about taking medicines and seeing a counselor at the same time. You are more likely to succeed when you do both.  If you are pregnant or breastfeeding, talk with your doctor about counseling or other ways to quit smoking. Do not take medicine to help you quit smoking unless your doctor tells you to do so. To quit smoking: Quit right away  Quit smoking totally, instead of slowly cutting back on how much you smoke over a period of time.  Go to counseling. You are more likely to quit if you go to counseling sessions regularly. Take medicine You may take medicines to help you quit. Some  medicines need a prescription, and some you can buy over-the-counter. Some medicines may contain a drug called nicotine to replace the nicotine in cigarettes. Medicines may:  Help you to stop having the desire to smoke (cravings).  Help to stop the problems that come when you stop smoking (withdrawal symptoms). Your doctor may ask you to use:  Nicotine patches, gum, or lozenges.  Nicotine inhalers or sprays.  Non-nicotine medicine that is taken by mouth. Find resources Find resources and other ways to help you quit smoking and remain smoke-free after you quit. These resources are most helpful when you use them often. They include:  Online chats with a counselor.  Phone quitlines.  Printed self-help materials.  Support groups or group counseling.  Text messaging programs.  Mobile phone apps. Use apps on your mobile phone or tablet that can help you stick to your quit plan. There are many free apps for mobile phones and tablets as well as websites. Examples include Quit Guide from the CDC and smokefree.gov   What things can I do to make it easier to quit?  Talk to your family and friends. Ask them to support and encourage you.  Call a phone quitline (1-800-QUIT-NOW), reach out to support groups, or work with a counselor.  Ask people who smoke to not smoke around you.  Avoid places that make you want to smoke,   such as: ? Bars. ? Parties. ? Smoke-break areas at work.  Spend time with people who do not smoke.  Lower the stress in your life. Stress can make you want to smoke. Try these things to help your stress: ? Getting regular exercise. ? Doing deep-breathing exercises. ? Doing yoga. ? Meditating. ? Doing a body scan. To do this, close your eyes, focus on one area of your body at a time from head to toe. Notice which parts of your body are tense. Try to relax the muscles in those areas.   How will I feel when I quit smoking? Day 1 to 3 weeks Within the first 24 hours,  you may start to have some problems that come from quitting tobacco. These problems are very bad 2-3 days after you quit, but they do not often last for more than 2-3 weeks. You may get these symptoms:  Mood swings.  Feeling restless, nervous, angry, or annoyed.  Trouble concentrating.  Dizziness.  Strong desire for high-sugar foods and nicotine.  Weight gain.  Trouble pooping (constipation).  Feeling like you may vomit (nausea).  Coughing or a sore throat.  Changes in how the medicines that you take for other issues work in your body.  Depression.  Trouble sleeping (insomnia). Week 3 and afterward After the first 2-3 weeks of quitting, you may start to notice more positive results, such as:  Better sense of smell and taste.  Less coughing and sore throat.  Slower heart rate.  Lower blood pressure.  Clearer skin.  Better breathing.  Fewer sick days. Quitting smoking can be hard. Do not give up if you fail the first time. Some people need to try a few times before they succeed. Do your best to stick to your quit plan, and talk with your doctor if you have any questions or concerns. Summary  Smoking tobacco is the leading cause of preventable death. Quitting smoking can be hard, but it is one of the best things that you can do for your health.  When you decide to quit smoking, make a plan to help you succeed.  Quit smoking right away, not slowly over a period of time.  When you start quitting, seek help from your doctor, family, or friends. This information is not intended to replace advice given to you by your health care provider. Make sure you discuss any questions you have with your health care provider. Document Revised: 01/04/2019 Document Reviewed: 06/30/2018 [employer] Patient Education  2021 [employer]  

## 2020-05-19 NOTE — Progress Notes (Signed)
[place] [phone]   [phone]  OFFICE PROGRESS NOTE  [name], NP [address] [name] [address] [place] [address]  DIAGNOSIS: Stage IV (T3, N0, M1C) non-small cell lung cancer, adenocarcinoma.The patient presented with a right lower lobe/infrahilar mass as well as a solitary brain metastasis in the left cerebellum. He was diagnosed in July 2021.  Molecular Biomarkers:  MSI-High DETECTED Pembrolizumab Atezolizumab, Avelumab, Cemiplimab, Dostarlimab, Durvalumab, Ipilimumab, Nivolumab  STK11Splice Site SNV 3.2% Everolimus, Temsirolimus Yes  [id number] 1.7% Binimetinib Yes  [id number] 0.4%  Niraparib, Olaparib, Rucaparib, Talazoparib, Tazemetostat Yes  PRIOR THERAPY:  1) SRS to the solitary brain metastasis under the care of Dr. Lisbeth Renshaw. Last treatment 11/14/19. 2) Weekly concurrent chemoradiation with carboplatin for an AUC of 2, paclitaxel 45 mg/m2.First dose expected on 11/25/2019. Status post 7 cycles, last dose was giving 01/06/2020 with partial response.   CURRENT THERAPY:  1)  Immunotherapy with Keytruda 200 mg IV every 3 weeks.  First dose February 10, 2020 for a patient with MSI high.  Status post 3 cycles. 2) Avastin 15 mg/KG every 3 weeks.  First dose today for the vasogenic edema of the brain.S/P 2 cycles.  INTERVAL HISTORY: Joseph Atkins 60 y.o. male returns to the clinic today for follow-up visit accompanied by his wife.  The patient is feeling fine today with no concerning complaints except for the shortness of breath with exertion as well as the fatigue.  He is currently on Decadron 2 mg p.o. daily.  He denied having any current chest pain, cough or hemoptysis.  He denied having any fever or chills.  He has no current nausea, vomiting, diarrhea or constipation.  He has no headache or visual changes.  He continues to tolerate his treatment with Keytruda and Avastin fairly well.  The patient had  repeat CT scan of the chest, abdomen pelvis performed recently and he is here for evaluation and discussion of his discuss results.  MEDICAL HISTORY: Past Medical History:  Diagnosis Date  . Cancer ([facility])    lung cancer  . Diverticulosis   . GERD (gastroesophageal reflux disease)   . Hx of small bowel obstruction   . Hyperlipidemia   . Hypothyroidism   . IBS (irritable bowel syndrome)   . Substance abuse ([facility])    Alcoholic, Drug addition  . Thyroid disease     ALLERGIES:  is allergic to penicillins.  MEDICATIONS:  Current Outpatient Medications  Medication Sig Dispense Refill  . albuterol (PROVENTIL HFA;VENTOLIN HFA) 108 (90 Base) MCG/ACT inhaler Inhale 2 puffs into the lungs every 6 (six) hours as needed for wheezing or shortness of breath. 1 Inhaler 0  . ANORO ELLIPTA 62.5-25 MCG/INH AEPB 1 puff daily.    Marland Kitchen dexamethasone (DECADRON) 2 MG tablet Take 2 tablets (4 mg total) by mouth 2 (two) times daily. (Patient taking differently: Take 2 mg by mouth 2 (two) times daily.) 120 tablet 2  . docusate sodium (COLACE) 100 MG capsule Take 100 mg by mouth daily.    . DULoxetine (CYMBALTA) 20 MG capsule Take 1 capsule (20 mg total) by mouth 2 (two) times daily. 60 capsule 5  . fluticasone furoate-vilanterol (BREO ELLIPTA) 100-25 MCG/INH AEPB Inhale 1 puff into the lungs daily. 30 each 3  . levothyroxine (SYNTHROID) 75 MCG tablet Take 75 mcg by mouth daily.    Marland Kitchen lidocaine-prilocaine (EMLA) cream Apply 1 application topically as needed. 30 g 0  . omeprazole (PRILOSEC) 40 MG capsule  Take 1 capsule (40 mg total) by mouth daily. 90 capsule 4  . ondansetron (ZOFRAN) 4 MG tablet Take 1 tablet (4 mg total) by mouth every 8 (eight) hours as needed. 40 tablet 2  . polyethylene glycol powder (MIRALAX) powder Take 17 g by mouth daily. 255 g 11  . TRULANCE 3 MG TABS Take 1 tablet by mouth daily.    . cyclobenzaprine (FLEXERIL) 10 MG tablet Take 10 mg by mouth 2 (two) times daily as needed for muscle  spasms. (Patient not taking: No sig reported)    . lidocaine (XYLOCAINE) 2 % solution Use as directed 15 mLs in the mouth or throat every 6 (six) hours as needed for mouth pain. Do not eat/drink within 60 minutes of taking this medication (Patient not taking: No sig reported) 100 mL 0  . oxyCODONE-acetaminophen (PERCOCET/ROXICET) 5-325 MG tablet Take 1 tablet by mouth every 4 (four) hours as needed for severe pain. (Patient not taking: No sig reported) 30 tablet 0  . prochlorperazine (COMPAZINE) 10 MG tablet Take 1 tablet (10 mg total) by mouth every 6 (six) hours as needed. (Patient not taking: Reported on 05/19/2020) 30 tablet 2  . sucralfate (CARAFATE) 1 g tablet Take 1 tablet (1 g total) by mouth 4 (four) times daily. Dissolve each tablet in 15 cc water before use. (Patient not taking: No sig reported) 120 tablet 2  . zolpidem (AMBIEN CR) 12.5 MG CR tablet Take 1 tablet (12.5 mg total) by mouth at bedtime as needed for sleep. (Patient not taking: Reported on 05/19/2020) 30 tablet 5   No current facility-administered medications for this visit.    SURGICAL HISTORY:  Past Surgical History:  Procedure Laterality Date  . arm surgery Right   . BRONCHIAL BRUSHINGS  10/24/2019   Procedure: BRONCHIAL BRUSHINGS;  Surgeon: Collene Gobble, MD;  Location: [facility] ENDOSCOPY;  Service: Cardiopulmonary;;  right lower lobe   . BRONCHIAL BRUSHINGS  11/05/2019   Procedure: BRONCHIAL BRUSHINGS;  Surgeon: Collene Gobble, MD;  Location: [facility] ENDOSCOPY;  Service: Pulmonary;;  . BRONCHIAL NEEDLE ASPIRATION BIOPSY  10/24/2019   Procedure: BRONCHIAL NEEDLE ASPIRATION BIOPSIES;  Surgeon: Collene Gobble, MD;  Location: [facility];  Service: Cardiopulmonary;;  . BRONCHIAL NEEDLE ASPIRATION BIOPSY  11/05/2019   Procedure: BRONCHIAL NEEDLE ASPIRATION BIOPSIES;  Surgeon: Collene Gobble, MD;  Location: [facility] ENDOSCOPY;  Service: Pulmonary;;  . ENDOBRONCHIAL ULTRASOUND N/A 10/24/2019   Procedure: ENDOBRONCHIAL ULTRASOUND;  Surgeon:  Collene Gobble, MD;  Location: [place];  Service: Cardiopulmonary;  Laterality: N/A;  . FINGER SURGERY Right    Middle  . IR IMAGING GUIDED PORT INSERTION  11/19/2019  . VIDEO BRONCHOSCOPY N/A 10/24/2019   Procedure: VIDEO BRONCHOSCOPY WITHOUT FLUORO;  Surgeon: Collene Gobble, MD;  Location: [facility] ENDOSCOPY;  Service: Cardiopulmonary;  Laterality: N/A;  . VIDEO BRONCHOSCOPY WITH ENDOBRONCHIAL NAVIGATION N/A 11/05/2019   Procedure: VIDEO BRONCHOSCOPY WITH ENDOBRONCHIAL NAVIGATION;  Surgeon: Collene Gobble, MD;  Location: [facility] ENDOSCOPY;  Service: Pulmonary;  Laterality: N/A;    REVIEW OF SYSTEMS:  Constitutional: positive for fatigue Eyes: negative Ears, nose, mouth, throat, and face: negative Respiratory: positive for dyspnea on exertion Cardiovascular: negative Gastrointestinal: negative Genitourinary:negative Integument/breast: negative Hematologic/lymphatic: negative Musculoskeletal:positive for muscle weakness Neurological: negative Behavioral/Psych: negative Endocrine: negative Allergic/Immunologic: negative   PHYSICAL EXAMINATION: General appearance: alert, cooperative, fatigued and no distress Head: Normocephalic, without obvious abnormality, atraumatic Neck: no adenopathy, no JVD, supple, symmetrical, trachea midline and thyroid not enlarged, symmetric, no tenderness/mass/nodules Lymph nodes: Cervical, supraclavicular,  and axillary nodes normal. Resp: clear to auscultation bilaterally Back: symmetric, no curvature. ROM normal. No CVA tenderness. Cardio: regular rate and rhythm, S1, S2 normal, no murmur, click, rub or gallop GI: soft, non-tender; bowel sounds normal; no masses,  no organomegaly Extremities: extremities normal, atraumatic, no cyanosis or edema Neurologic: Alert and oriented X 3, normal strength and tone. Normal symmetric reflexes. Normal coordination and gait  ECOG PERFORMANCE STATUS: 1 - Symptomatic but completely ambulatory  Blood pressure (!) 134/104,  pulse (!) 108, temperature (!) 97.5 F (36.4 C), temperature source Tympanic, resp. rate 18, height _0  (1.727 m), weight 190 lb 9.6 oz (86.5 kg), SpO2 98 %.  LABORATORY DATA: Lab Results  Component Value Date   WBC 9.0 05/19/2020   HGB 11.6 (L) 05/19/2020   HCT 37.1 (L) 05/19/2020   MCV 104.2 (H) 05/19/2020   PLT 325 05/19/2020      Chemistry      Component Value Date/Time   NA 140 05/19/2020 0750   NA 141 03/08/2017 1707   K 4.0 05/19/2020 0750   CL 107 05/19/2020 0750   CO2 24 05/19/2020 0750   BUN 8 05/19/2020 0750   BUN 12 03/08/2017 1707   CREATININE 0.88 05/19/2020 0750      Component Value Date/Time   CALCIUM 8.9 05/19/2020 0750   ALKPHOS 50 05/19/2020 0750   AST 20 05/19/2020 0750   ALT 24 05/19/2020 0750   BILITOT 0.5 05/19/2020 0750       RADIOGRAPHIC STUDIES: CT Chest W Contrast  Result Date: 05/15/2020 CLINICAL DATA:  Primary Cancer Type: Lung Imaging Indication: Assess response to therapy Interval therapy since last imaging? Yes Initial Cancer Diagnosis Date: 11/05/2019; Established by: Biopsy-proven Detailed Pathology: Stage IV non-small cell lung cancer, adenocarcinoma. Primary Tumor location: Right lower lobe. Solitary brain metastasis. Surgeries: No. Chemotherapy: Yes; Ongoing? Yes; Most recent administration: 04/28/2020 Immunotherapy?  Yes; Type: Keytruda; Ongoing? Yes Radiation therapy? Yes Date Range: 03/05/2020; Target: Brain metastasis Date Range: 11/25/2019 - 01/09/2020; Target: Right lung Date Range: 11/14/2019; Target: Cerebellar metastasis EXAM: CT CHEST, ABDOMEN, AND PELVIS WITH CONTRAST TECHNIQUE: Multidetector CT imaging of the chest, abdomen and pelvis was performed following the standard protocol during bolus administration of intravenous contrast. CONTRAST:  119m OMNIPAQUE IOHEXOL 300 MG/ML  SOLN COMPARISON:  Most recent CT chest 01/31/2020. 11/13/2019 PET-CT. CT chest, abdomen and pelvis 10/18/2019. FINDINGS: CT CHEST FINDINGS Cardiovascular:  Right Port-A-Cath tip high right atrium. Aortic atherosclerosis. Normal heart size, without pericardial effusion. Lad coronary artery calcification. No central pulmonary embolism, on this non-dedicated study. Mediastinum/Nodes: No supraclavicular adenopathy. No mediastinal or hilar adenopathy. Lungs/Pleura: No pleural fluid.  Moderate centrilobular emphysema. Central right lower lobe lung lesion measures 2.7 x 2.2 cm on 126/7. Compare 3.3 x 2.4 cm on the prior exam. Adjacent or contiguous more cephalad nodularity at 1.7 x 1.4 cm on 120/7 versus 2.2 x 12.0 cm on the prior exam (when remeasured). Multifocal right greater than left paramediastinal airspace and ground-glass is presumably radiation induced, new. New bilateral pulmonary nodules. In the left upper lobe at 5 mm on 78/7, solid. A left lower lobe cavitary nodule is new at 7 mm on 120/7. Minimally cavitary nodulein the posterior right upper lobe at 7 mm on 62/7. 2 mm left upper lobe pulmonary nodule on 62/7. Musculoskeletal: No acute osseous abnormality. CT ABDOMEN PELVIS FINDINGS Hepatobiliary: Segment 2 hypoattenuation at 9 mm on 62/2, present back to 2018 and considered benign. Normal gallbladder, without biliary ductal dilatation. Pancreas: Normal, without mass  or ductal dilatation. Spleen: Normal in size, without focal abnormality. Adrenals/Urinary Tract: Normal adrenal glands. Normal kidneys, without hydronephrosis. Normal urinary bladder. Stomach/Bowel: Proximal gastric underdistention. Normal colon and terminal ileum. Normal small bowel. Vascular/Lymphatic: Advanced aortic and branch vessel atherosclerosis. Left common and external iliac artery occlusion with reconstitution by the left inferior epigastric. Retroaortic left renal vein. No abdominopelvic adenopathy. Reproductive: Normal prostate. Other: No significant free fluid. Musculoskeletal: No acute osseous abnormality. IMPRESSION: 1. Since 01/31/2020, decreased size of right lower lobe lung  lesion. 2. New bilateral pulmonary nodules, highly suspicious for metastatic disease. 3. No thoracic adenopathy. 4. No evidence of metastatic disease in the abdomen or pelvis. 5. Left common and external iliac artery occlusion with reconstitution via distal collaterals. New since the most recent abdominal CT of 10/18/2019. 6. Aortic atherosclerosis (ICD10-I70.0), coronary artery atherosclerosis and emphysema (ICD10-J43.9). 7. Electronically Signed   By: Abigail Miyamoto M.D.   On: 05/15/2020 10:36   CT Abdomen Pelvis W Contrast  Result Date: 05/15/2020 CLINICAL DATA:  Primary Cancer Type: Lung Imaging Indication: Assess response to therapy Interval therapy since last imaging? Yes Initial Cancer Diagnosis Date: 11/05/2019; Established by: Biopsy-proven Detailed Pathology: Stage IV non-small cell lung cancer, adenocarcinoma. Primary Tumor location: Right lower lobe. Solitary brain metastasis. Surgeries: No. Chemotherapy: Yes; Ongoing? Yes; Most recent administration: 04/28/2020 Immunotherapy?  Yes; Type: Keytruda; Ongoing? Yes Radiation therapy? Yes Date Range: 03/05/2020; Target: Brain metastasis Date Range: 11/25/2019 - 01/09/2020; Target: Right lung Date Range: 11/14/2019; Target: Cerebellar metastasis EXAM: CT CHEST, ABDOMEN, AND PELVIS WITH CONTRAST TECHNIQUE: Multidetector CT imaging of the chest, abdomen and pelvis was performed following the standard protocol during bolus administration of intravenous contrast. CONTRAST:  18mL OMNIPAQUE IOHEXOL 300 MG/ML  SOLN COMPARISON:  Most recent CT chest 01/31/2020. 11/13/2019 PET-CT. CT chest, abdomen and pelvis 10/18/2019. FINDINGS: CT CHEST FINDINGS Cardiovascular: Right Port-A-Cath tip high right atrium. Aortic atherosclerosis. Normal heart size, without pericardial effusion. Lad coronary artery calcification. No central pulmonary embolism, on this non-dedicated study. Mediastinum/Nodes: No supraclavicular adenopathy. No mediastinal or hilar adenopathy.  Lungs/Pleura: No pleural fluid.  Moderate centrilobular emphysema. Central right lower lobe lung lesion measures 2.7 x 2.2 cm on 126/7. Compare 3.3 x 2.4 cm on the prior exam. Adjacent or contiguous more cephalad nodularity at 1.7 x 1.4 cm on 120/7 versus 2.2 x 12.0 cm on the prior exam (when remeasured). Multifocal right greater than left paramediastinal airspace and ground-glass is presumably radiation induced, new. New bilateral pulmonary nodules. In the left upper lobe at 5 mm on 78/7, solid. A left lower lobe cavitary nodule is new at 7 mm on 120/7. Minimally cavitary nodulein the posterior right upper lobe at 7 mm on 62/7. 2 mm left upper lobe pulmonary nodule on 62/7. Musculoskeletal: No acute osseous abnormality. CT ABDOMEN PELVIS FINDINGS Hepatobiliary: Segment 2 hypoattenuation at 9 mm on 62/2, present back to 2018 and considered benign. Normal gallbladder, without biliary ductal dilatation. Pancreas: Normal, without mass or ductal dilatation. Spleen: Normal in size, without focal abnormality. Adrenals/Urinary Tract: Normal adrenal glands. Normal kidneys, without hydronephrosis. Normal urinary bladder. Stomach/Bowel: Proximal gastric underdistention. Normal colon and terminal ileum. Normal small bowel. Vascular/Lymphatic: Advanced aortic and branch vessel atherosclerosis. Left common and external iliac artery occlusion with reconstitution by the left inferior epigastric. Retroaortic left renal vein. No abdominopelvic adenopathy. Reproductive: Normal prostate. Other: No significant free fluid. Musculoskeletal: No acute osseous abnormality. IMPRESSION: 1. Since 01/31/2020, decreased size of right lower lobe lung lesion. 2. New bilateral pulmonary nodules, highly suspicious for metastatic  disease. 3. No thoracic adenopathy. 4. No evidence of metastatic disease in the abdomen or pelvis. 5. Left common and external iliac artery occlusion with reconstitution via distal collaterals. New since the most recent  abdominal CT of 10/18/2019. 6. Aortic atherosclerosis (ICD10-I70.0), coronary artery atherosclerosis and emphysema (ICD10-J43.9). 7. Electronically Signed   By: Abigail Miyamoto M.D.   On: 05/15/2020 10:36    ASSESSMENT AND PLAN: This is a very pleasant 60 years old white male with a stage IV (t3, N0, M1c) non-small cell lung cancer, adenocarcinoma with MSI high presented with right lower lobe/infrahilar mass in addition to solitary brain metastasis in the left cerebellum diagnosed in July 2021. He is status post SRS to the solitary brain metastasis. The patient completed a course of concurrent chemoradiation with weekly carboplatin and paclitaxel.  He tolerated the treatment well except for fatigue and mild odynophagia. He had repeat CT scan of the chest performed recently.  I personally and independently reviewed the scans and discussed the results with the patient and his wife. His scan showed interval decrease in the volume of the right infrahilar mass with no other evidence of metastatic disease. The patient has MSI high and I recommended for him treatment with immunotherapy with single agent Keytruda 200 mg IV every 3 weeks for a total of 2 years unless the patient has unacceptable toxicity or disease progression. He is status post 3 cycles of treatment with Keytruda.  He also received 2 cycles of Avastin for the vasogenic edema in the brain.  The patient tolerated the previous treatment well. The patient had repeat CT scan of the chest, abdomen pelvis performed recently.  I personally and independently reviewed the scan images and discussed the results with the patient and his wife. His scan showed improvement in the dominant masses of his disease in the lung with no evidence for metastatic disease outside the chest but there was 3 subcentimeter pulmonary nodules concerning for metastatic disease but inflammatory process could not be completely excluded too.  I had a lengthy discussion with the patient  and his wife about his scan results and treatment options.  I gave the patient the option of continuing his treatment with [facility] for 2 more cycles with repeat imaging studies for evaluation of his disease versus switching his treatment to systemic chemotherapy with carboplatin, Alimta and Avastin. After discussion of the 2 options the patient and his wife elected to continue his current treatment with [facility] for now. I will see him back for follow-up visit in 3 weeks for evaluation before starting cycle #5 and I will order CT scan of the chest at that time. For the brain metastasis he is followed by Dr. Mickeal Skinner.  We will continue to taper his Decadron with 2 mg p.o. daily for 1 week and then 2 mg p.o. every other day for 1 week before discontinuing the medication. For the hypertension he will continue to monitor his blood pressure closely at home and discuss with his primary care physician consideration of treatment if needed. The patient was advised to call immediately if he has any concerning symptoms in the interval. The patient voices understanding of current disease status and treatment options and is in agreement with the current care plan.  All questions were answered. The patient knows to call the clinic with any problems, questions or concerns. We can certainly see the patient much sooner if necessary.  Disclaimer: This note was dictated with voice recognition software. Similar sounding words can inadvertently be transcribed  and may not be corrected upon review.

## 2020-05-20 ENCOUNTER — Telehealth: Payer: Self-pay | Admitting: Internal Medicine

## 2020-05-20 NOTE — Telephone Encounter (Signed)
Scheduled appointments per 1/25 los. Will have updated calendar printed for patient at next visit.

## 2020-05-20 NOTE — Progress Notes (Signed)
  Radiation Oncology         [phone] ________________________________  Name: Joseph Atkins MRN: [id number]  Date: 03/05/2020  DOB: 1960-08-01  End of Treatment Note  Diagnosis:   Brain metastasis     Indication for treatment:  palliative       Radiation treatment dates:   03/05/20  Site/dose:   20 Gy/ 1 fraction ExacTrac, 7 vmat beams max dose=136.2% PTV2 Rt Temp 62mm PTV3 Lt Occ 59mm PTV4 Lt Occ 92mm PTV5 Rt Temp 41mm PTV6 Lt Occ 64mm PTV7 Rt Par 3mm PTV8 Lt Par 28mm  Narrative: The patient tolerated radiation treatment well.   There were no signs of acute toxicity after treatment.  Plan: The patient has completed radiation treatment. The patient will return to radiation oncology clinic for routine followup in one month. I advised the patient to call or return sooner if they have any questions or concerns related to their recovery or treatment. ________________________________  ------------------------------------------------  Jodelle Gross, MD, PhD

## 2020-06-01 ENCOUNTER — Encounter: Payer: Self-pay | Admitting: Radiation Oncology

## 2020-06-01 ENCOUNTER — Other Ambulatory Visit: Payer: Self-pay

## 2020-06-01 ENCOUNTER — Telehealth: Payer: Self-pay

## 2020-06-01 NOTE — Telephone Encounter (Signed)
Spoke with patient in regards to telephone follow-up appointment with Shona Simpson PA on 06/08/20 @ 2:00pm. Patient verbalized understanding of appointment date and time. Meaningful use questions were reviewed. TM

## 2020-06-01 NOTE — Telephone Encounter (Signed)
Left patient a voicemail message to call back in regards to telephone visit with Joseph Atkins on 06/08/20 @ 3:00pm. Called to review meaningful use questions. TM

## 2020-06-05 ENCOUNTER — Other Ambulatory Visit: Payer: Self-pay

## 2020-06-05 ENCOUNTER — Ambulatory Visit
Admission: RE | Admit: 2020-06-05 | Discharge: 2020-06-05 | Disposition: A | Discharge status: Home / Self Care | Payer: Managed Care, Other (non HMO) | Source: Ambulatory Visit | Attending: Radiation Oncology | Admitting: Radiation Oncology

## 2020-06-05 DIAGNOSIS — C7931 Secondary malignant neoplasm of brain: Secondary | ICD-10-CM

## 2020-06-05 IMAGING — MR MR HEAD WO/W CM
12 series · 48 of 48 positions shown · IV contrast (18ml Multihance)
Comparison: MRI of the brain [DATE].

CLINICAL DATA: Brain metastases.  Surveillance.

EXAM:
MRI HEAD WITHOUT AND WITH CONTRAST
TECHNIQUE: Multiplanar, multiecho pulse sequences of the brain and surrounding
structures were obtained without and with intravenous contrast.
CONTRAST:  20mL MULTIHANCE GADOBENATE DIMEGLUMINE 529 MG/ML IV SOLN

[Series 2: FLAIR · sagittal · 3.0mm · 0.75mm/px · 3 of 39 slices shown (1 of 2)]
[im 1/39]
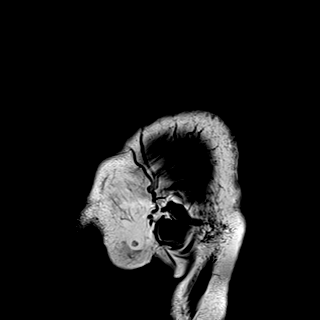
[im 20/39]
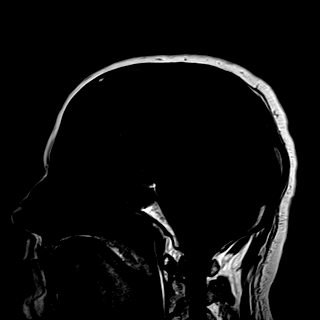
[im 39/39]
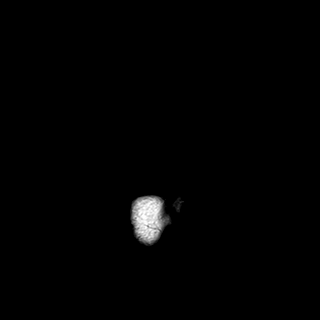

[Series 3: DWI · axial · 3.0mm · 1.50mm/px · z∈[-77,+79]mm · 4 of 82 slices shown (1 of 2)]
[im 1/82]
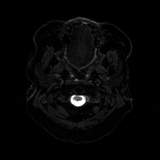
[im 28/82]
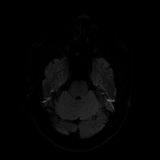
[im 55/82]
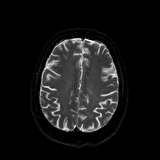
[im 82/82]
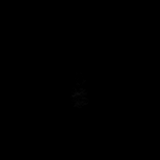

[Series 4: DWI · axial · 3.0mm · 1.50mm/px · z∈[-77,+79]mm · 2 of 41 slices shown (2 of 2)]
[im 1/41]
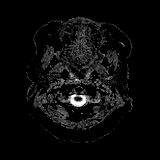
[im 41/41]
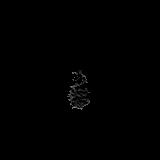

[Series 5: T2 · axial · 5.0mm · 0.57mm/px · 1 of 27 slices shown (1 of 2)]
[im 1/27]
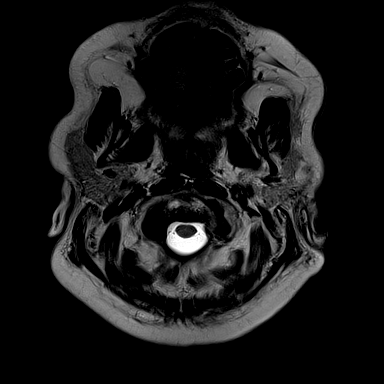

[Series 7: swi_images · axial · 1.5mm · 0.90mm/px · z∈[-72,+82]mm · 5 of 104 slices shown]
[im 1/104]
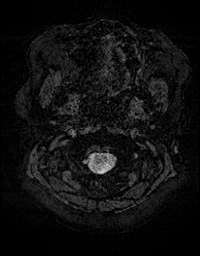
[im 26/104]
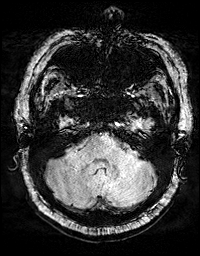
[im 52/104]
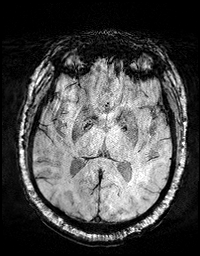
[im 78/104]
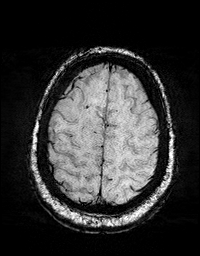
[im 104/104]
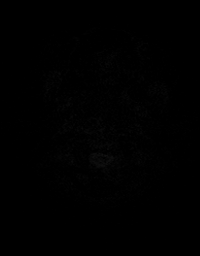

[Series 8: FLAIR · axial · 3.0mm · 0.86mm/px · z∈[-82,+80]mm · 3 of 55 slices shown (2 of 2)]
[im 1/55]
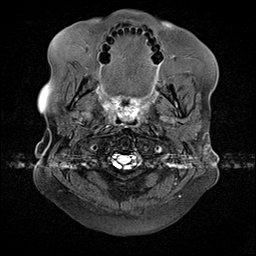
[im 28/55]
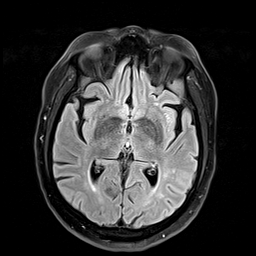
[im 55/55]
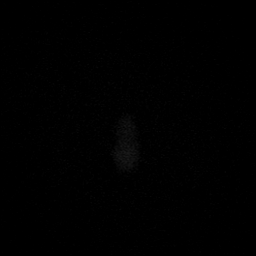

[Series 9: T2 · axial · non-contrast · 1.0mm · 0.86mm/px · z∈[-78,+78]mm · 8 of 160 slices shown (2 of 2)]
[im 1/160]
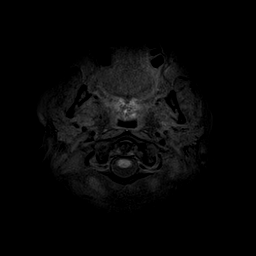
[im 23/160]
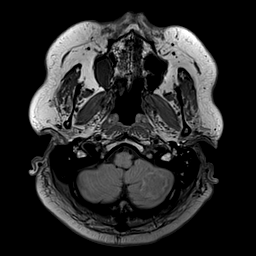
[im 46/160]
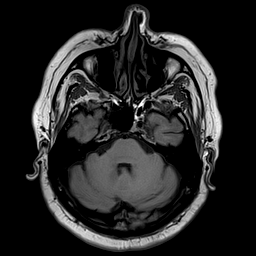
[im 69/160]
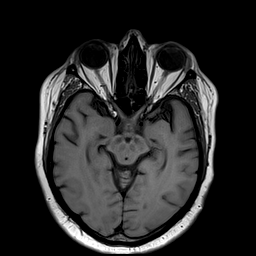
[im 91/160]
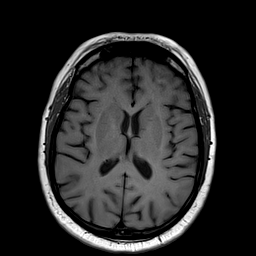
[im 114/160]
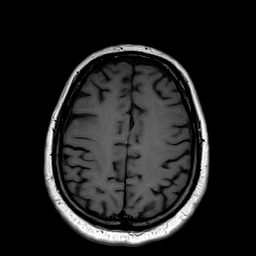
[im 137/160]
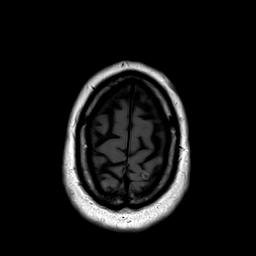
[im 160/160]
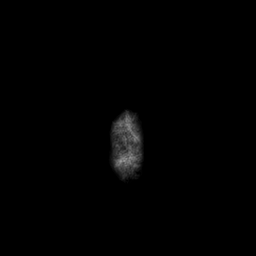

[Series 10: T2 post-contrast · coronal · 3.0mm · 0.57mm/px · 2 of 47 slices shown (1 of 2)]
[im 1/47]
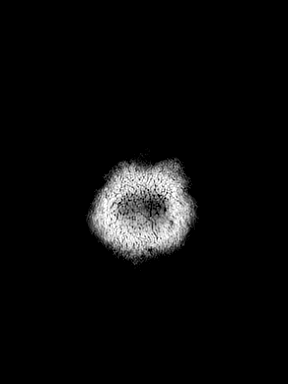
[im 47/47]
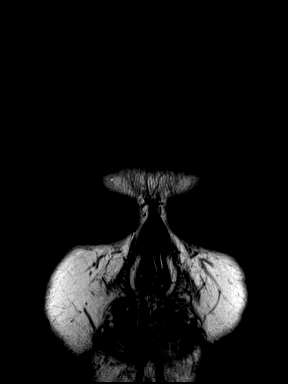

[Series 11: T2 post-contrast · axial · 1.0mm · 0.86mm/px · z∈[-78,+78]mm · 8 of 160 slices shown (2 of 2)]
[im 1/160]
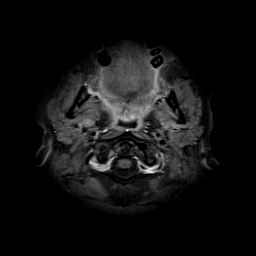
[im 23/160]
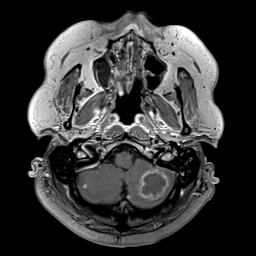
[im 46/160]
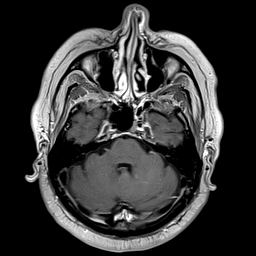
[im 69/160]
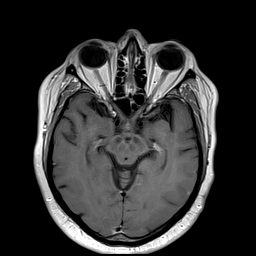
[im 91/160]
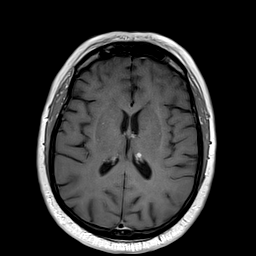
[im 114/160]
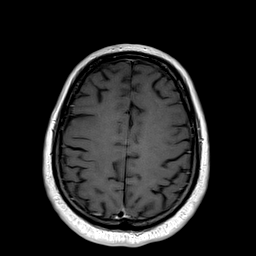
[im 137/160]
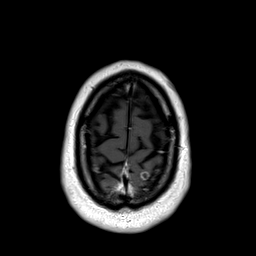
[im 160/160]
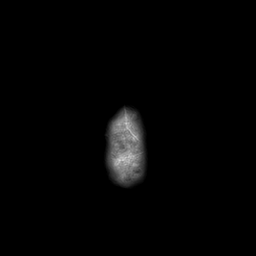

[Series 12: T1 post-contrast · axial · 1.0mm · 0.75mm/px · z∈[-76,+83]mm · 8 of 160 slices shown (1 of 2)]
[im 1/160]
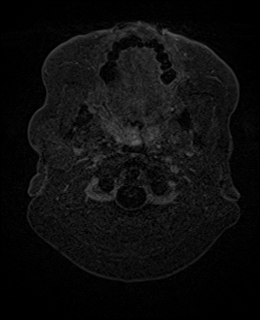
[im 23/160]
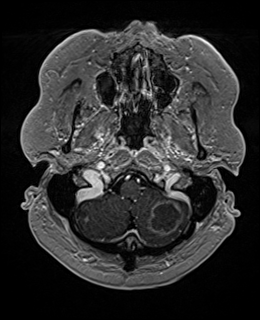
[im 46/160]
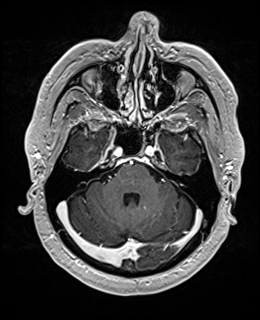
[im 69/160]
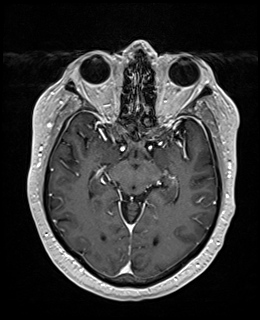
[im 91/160]
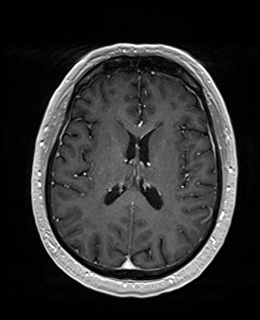
[im 114/160]
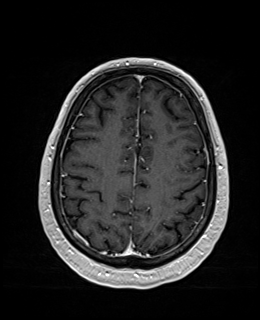
[im 137/160]
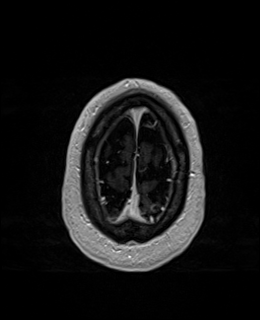
[im 160/160]
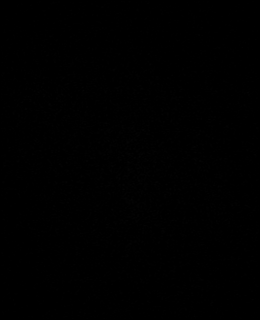

[Series 13: T1 post-contrast · coronal · 3.0mm · 0.57mm/px · 2 of 47 slices shown (2 of 2)]
[im 1/47]
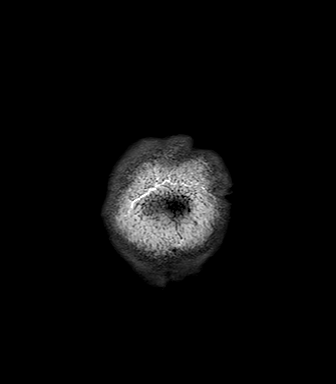
[im 47/47]
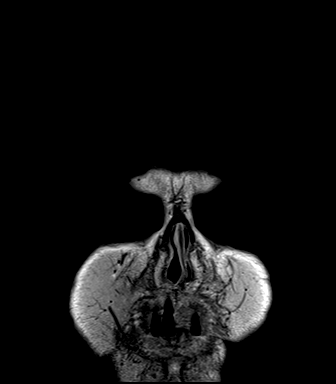

[Series 14: FLAIR post-contrast · sagittal · 3.0mm · 0.75mm/px · 2 of 39 slices shown]
[im 1/39]
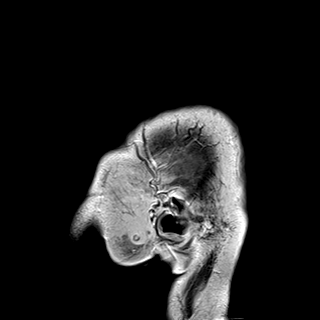
[im 39/39]
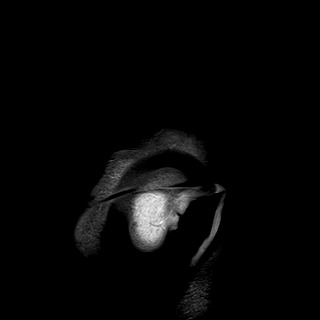

[48 of 48 positions shown; findings below may reference images not displayed]

FINDINGS: Brain: No acute infarction, hemorrhage, hydrocephalus or extra-axial
collection.

Scattered foci of T2 hyperintensity are seen within the white matter
of the cerebral hemispheres, nonspecific, unchanged from prior.

Multiple enhancing lesions consistent with metastatic disease to the
brain are described below and labeled on series 11.

Left inferior cerebellar hemisphere lesion (image 27) appear larger
than on prior MRI, measuring approximately 2.9 x 2.5 cm (2.7 x
cm on prior). However, rim enhancement appears thinner and without
sulcal enhancement seen prior and significant improvement of the
surrounding vasogenic edema with resolution of the mass effect on
the fourth ventricle.

Smaller lesion:

*Left posterior frontal lobe, 6 mm (9 mm on prior), with resolution
of the surrounding vasogenic edema, image 137;
*Right precentral gyrus, 6 mm (7 mm on prior) with near resolution
of the surrounding vasogenic edema, image 125;
*Right thalamus, 1-2 mm (3 mm on prior), no surrounding edema, image
87;
*Left occipital lobe, faint 1 mm (6 mm on prior), no surrounding
edema, image 80;
*Left occipital lobe, 1 mm (2 mm on prior), no significant edema,
image 64;
*Left occipital lobe, 4 mm (5 mm on prior), no significant edema,
image 63;
*Right temporal lobe lesion is no longer seen.

New lesions:

*Left middle frontal gyrus, 1 mm, no edema, image 126;
*Right parietal lobe, 3 mm, no significant surrounding edema, image
100;
*Left frontal burr:, tumor, no significant surrounding edema, image
89;
*Right occipital lobe, 3 mm, no surrounding edema, image 75;
*Right occipital lobe, 3 mm, no surrounding edema, image 65;
*Right inferior cerebellar hemisphere, 4 mm, no associated edema,
image 24.

Vascular: Normal flow voids.

Skull and upper cervical spine: Normal marrow signal.

Sinuses/Orbits: Mucous retention cyst within the right maxillary
sinus. Mucosal thickening of the left sphenoid sinus. The orbits are
maintained.

Other: Minimal bilateral mastoid effusion.
IMPRESSION: 1. Interval decrease in thickness of the rim enhancement associated
with left inferior cerebellar hemisphere lesion with decreased
surrounding vasogenic edema with resolution of sulcal enhancement
and mass effect which indicate treatment response despite larger
lesion size.
2. Otherwise, mixed treatment response with decreased size of
multiple lesions and identification of new enhancing lesions.

## 2020-06-05 MED ORDER — GADOBENATE DIMEGLUMINE 529 MG/ML IV SOLN
20.0000 mL | Freq: Once | INTRAVENOUS | Status: AC | PRN
  Administered 2020-06-05: 20 mL via INTRAVENOUS

## 2020-06-05 MED ORDER — SODIUM CHLORIDE 0.9% FLUSH: 10.0000 mL | INTRAVENOUS | Status: DC | PRN

## 2020-06-05 MED ORDER — HEPARIN SOD (PORK) LOCK FLUSH 100 UNIT/ML IV SOLN
500.0000 [IU] | Freq: Once | INTRAVENOUS | Status: AC
  Administered 2020-06-05: 500 [IU] via INTRAVENOUS

## 2020-06-08 ENCOUNTER — Ambulatory Visit
Admission: RE | Admit: 2020-06-08 | Discharge: 2020-06-08 | Disposition: A | Discharge status: Home / Self Care | Payer: Managed Care, Other (non HMO) | Source: Ambulatory Visit | Attending: Radiation Oncology | Admitting: Radiation Oncology

## 2020-06-08 ENCOUNTER — Other Ambulatory Visit: Payer: Self-pay

## 2020-06-08 ENCOUNTER — Inpatient Hospital Stay: Payer: Managed Care, Other (non HMO)

## 2020-06-08 DIAGNOSIS — Z5112 Encounter for antineoplastic immunotherapy: Secondary | ICD-10-CM | POA: Insufficient documentation

## 2020-06-08 DIAGNOSIS — K219 Gastro-esophageal reflux disease without esophagitis: Secondary | ICD-10-CM | POA: Insufficient documentation

## 2020-06-08 DIAGNOSIS — R1310 Dysphagia, unspecified: Secondary | ICD-10-CM | POA: Insufficient documentation

## 2020-06-08 DIAGNOSIS — F1010 Alcohol abuse, uncomplicated: Secondary | ICD-10-CM | POA: Insufficient documentation

## 2020-06-08 DIAGNOSIS — I2510 Atherosclerotic heart disease of native coronary artery without angina pectoris: Secondary | ICD-10-CM | POA: Insufficient documentation

## 2020-06-08 DIAGNOSIS — Z8719 Personal history of other diseases of the digestive system: Secondary | ICD-10-CM | POA: Insufficient documentation

## 2020-06-08 DIAGNOSIS — C3431 Malignant neoplasm of lower lobe, right bronchus or lung: Secondary | ICD-10-CM | POA: Insufficient documentation

## 2020-06-08 DIAGNOSIS — R531 Weakness: Secondary | ICD-10-CM | POA: Insufficient documentation

## 2020-06-08 DIAGNOSIS — C7931 Secondary malignant neoplasm of brain: Secondary | ICD-10-CM

## 2020-06-08 DIAGNOSIS — Z79899 Other long term (current) drug therapy: Secondary | ICD-10-CM | POA: Insufficient documentation

## 2020-06-08 DIAGNOSIS — I700 Atherosclerosis of aorta: Secondary | ICD-10-CM | POA: Insufficient documentation

## 2020-06-08 DIAGNOSIS — J439 Emphysema, unspecified: Secondary | ICD-10-CM | POA: Insufficient documentation

## 2020-06-08 DIAGNOSIS — K589 Irritable bowel syndrome without diarrhea: Secondary | ICD-10-CM | POA: Insufficient documentation

## 2020-06-08 DIAGNOSIS — E039 Hypothyroidism, unspecified: Secondary | ICD-10-CM | POA: Insufficient documentation

## 2020-06-08 DIAGNOSIS — E785 Hyperlipidemia, unspecified: Secondary | ICD-10-CM | POA: Insufficient documentation

## 2020-06-08 NOTE — Progress Notes (Signed)
Radiation Oncology         [phone] ________________________________  Outpatient Follow Up - Conducted via telephone due to current COVID-19 concerns for limiting patient exposure  I spoke with the patient to conduct this consult visit via telephone to spare the patient unnecessary potential exposure in the healthcare setting during the current COVID-19 pandemic. The patient was notified in advance and was offered a [place] meeting to allow for face to face communication but unfortunately reported that they did not have the appropriate resources/technology to support such a visit and instead preferred to proceed with a telephone visit.  Name: Joseph Atkins MRN: [id number]  Date of Service: 06/08/2020  DOB: July 08, 1960  Follow Up   Diagnosis:  Stage IV, cT3, N0, M1c, non-small cell lung cancer, adenocarcinoma of the right lower lobe  Interval Since Last Radiation:  3 months  03/05/20 SRS Treatment: The following lesions were treated to  PTV2 Rt Temp 70mm PTV3 Lt Occ 56mm PTV4 Lt Occ 79mm PTV5 Rt Temp 58mm PTV6 Lt Occ 31mm PTV7 Rt Par 78mm PTV8 Lt Par 32mm  11/25/19 - 01/09/20: The patient was treated to the disease within the right lung initially to a dose of 60 Gy using a 4 field, 3-D conformal technique. The patient then received a cone down boost treatment for an additional 6 Gy. This yielded a final total dose of 66 Gy.   11/14/19 SRS Treatment:  PTV1 Lt Cerebellum 28 mm, 18 Gy in 1 fraction  Narrative: This is a pleasant 60 y.o. male with a history of stage IV non-small cell lung cancer, adenocarcinoma arising in the right lower lobe with what was originally a solitary brain metastasis.  He was treated with stereotactic radiosurgery [facility]) in July 2021, and definitive chemoradiation to the lung which he completed in September 2021.  He has been receiving consolidative Keytruda with Dr. Julien Nordmann.  He has had a difficult time with trying to taper his steroids with what resulted in rebound  edema following the tapering or discontinuing certain doses, and having to go back to steroids even just recently. He had SRS to new disease sites in November, and his MRI brain on 06/05/20 showed improvement in some of the previously treated sites, and persistence of the measurement of the left cerebellar lesion though improved, thinner contrast enhancement and less sulcal edema. He does however have 6 new lesions after treatment plan fusion by physics. He has discontinued steroids and has been off them about 3-4 days.  On review of systems, the patient reports that he is doing well overall. He is bored at home and wants to get back to driving. He denies any chest pain, shortness of breath, cough, fevers, chills, night sweats, unintended weight changes.He does have occasional discomfort in the head but states it is not quite a headache or dizziness or difficulty with gait like in the past. He denies any unintentional movements or jerking. His wife confirms he has not had any witnessed seizures either.   PAST MEDICAL HISTORY:  Past Medical History:  Diagnosis Date  . Cancer ([place])    lung cancer  . Diverticulosis   . GERD (gastroesophageal reflux disease)   . Hx of small bowel obstruction   . Hyperlipidemia   . Hypothyroidism   . IBS (irritable bowel syndrome)   . Substance abuse ([facility])    Alcoholic, Drug addition  . Thyroid disease     PAST SURGICAL HISTORY: Past Surgical History:  Procedure Laterality Date  . arm surgery  Right   . BRONCHIAL BRUSHINGS  10/24/2019   Procedure: BRONCHIAL BRUSHINGS;  Surgeon: Collene Gobble, MD;  Location[facility] ENDOSCOPY;  Service: Cardiopulmonary;;  right lower lobe   . BRONCHIAL BRUSHINGS  11/05/2019   Procedure: BRONCHIAL BRUSHINGS;  Surgeon: Collene Gobble, MD;  Location: [facility] ENDOSCOPY;  Service: Pulmonary;;  . BRONCHIAL NEEDLE ASPIRATION BIOPSY  10/24/2019   Procedure: BRONCHIAL NEEDLE ASPIRATION BIOPSIES;  Surgeon: Collene Gobble, MD;  Location: [place];  Service: Cardiopulmonary;;  . BRONCHIAL NEEDLE ASPIRATION BIOPSY  11/05/2019   Procedure: BRONCHIAL NEEDLE ASPIRATION BIOPSIES;  Surgeon: Collene Gobble, MD;  Location: [facility] ENDOSCOPY;  Service: Pulmonary;;  . ENDOBRONCHIAL ULTRASOUND N/A 10/24/2019   Procedure: ENDOBRONCHIAL ULTRASOUND;  Surgeon: Collene Gobble, MD;  Location: [place];  Service: Cardiopulmonary;  Laterality: N/A;  . FINGER SURGERY Right    Middle  . IR IMAGING GUIDED PORT INSERTION  11/19/2019  . VIDEO BRONCHOSCOPY N/A 10/24/2019   Procedure: VIDEO BRONCHOSCOPY WITHOUT FLUORO;  Surgeon: Collene Gobble, MD;  Location: [facility] ENDOSCOPY;  Service: Cardiopulmonary;  Laterality: N/A;  . VIDEO BRONCHOSCOPY WITH ENDOBRONCHIAL NAVIGATION N/A 11/05/2019   Procedure: VIDEO BRONCHOSCOPY WITH ENDOBRONCHIAL NAVIGATION;  Surgeon: Collene Gobble, MD;  Location: [place] ENDOSCOPY;  Service: Pulmonary;  Laterality: N/A;    PAST SOCIAL HISTORY:  Social History   Socioeconomic History  . Marital status: Married    Spouse name: Not on file  . Number of children: 2  . Years of education: Not on file  . Highest education level: Not on file  Occupational History  . Occupation: Buyer, retail: [employer]  Tobacco Use  . Smoking status: Current Every Day Smoker    Packs/day: 1.00    Types: Cigarettes  . Smokeless tobacco: Former Systems developer    Types: Secondary school teacher  . Vaping Use: Never used  Substance and Sexual Activity  . Alcohol use: No    Comment: Alcoholic clean for 6 years  . Drug use: No    Comment: Recovering Drug Addict-clean for 6 years  . Sexual activity: Not on file  Other Topics Concern  . Not on file  Social History Narrative  . Not on file   Social Determinants of Health   Financial Resource Strain: Medium Risk  . Difficulty of Paying Living Expenses: Somewhat hard  Food Insecurity: No Food Insecurity  . Worried About Charity fundraiser in the Last Year: Never true  . Ran Out of Food in the  Last Year: Never true  Transportation Needs: No Transportation Needs  . Lack of Transportation (Medical): No  . Lack of Transportation (Non-Medical): No  Physical Activity: Insufficiently Active  . Days of Exercise per Week: 5 days  . Minutes of Exercise per Session: 20 min  Stress: Stress Concern Present  . Feeling of Stress : Very much  Social Connections: Moderately Isolated  . Frequency of Communication with Friends and Family: More than three times a week  . Frequency of Social Gatherings with Friends and Family: More than three times a week  . Attends Religious Services: Never  . Active Member of Clubs or Organizations: No  . Attends Archivist Meetings: Never  . Marital Status: Married  Human resources officer Violence: Not At Risk  . Fear of Current or Ex-Partner: No  . Emotionally Abused: No  . Physically Abused: No  . Sexually Abused: No    PAST FAMILY HISTORY: Family History  Problem Relation Age of Onset  .  Epilepsy Mother   . Heart disease Mother   . Heart disease Brother   . Lung cancer Paternal Uncle     MEDICATIONS  Current Outpatient Medications  Medication Sig Dispense Refill  . albuterol (PROVENTIL HFA;VENTOLIN HFA) 108 (90 Base) MCG/ACT inhaler Inhale 2 puffs into the lungs every 6 (six) hours as needed for wheezing or shortness of breath. 1 Inhaler 0  . ANORO ELLIPTA 62.5-25 MCG/INH AEPB 1 puff daily.    Marland Kitchen dexamethasone (DECADRON) 2 MG tablet Take 2 tablets (4 mg total) by mouth 2 (two) times daily. (Patient taking differently: Take 2 mg by mouth 2 (two) times daily.) 120 tablet 2  . docusate sodium (COLACE) 100 MG capsule Take 100 mg by mouth daily.    . DULoxetine (CYMBALTA) 20 MG capsule Take 1 capsule (20 mg total) by mouth 2 (two) times daily. 60 capsule 5  . fluticasone furoate-vilanterol (BREO ELLIPTA) 100-25 MCG/INH AEPB Inhale 1 puff into the lungs daily. 30 each 3  . levothyroxine (SYNTHROID) 75 MCG tablet Take 75 mcg by mouth daily.    Marland Kitchen  lidocaine-prilocaine (EMLA) cream Apply 1 application topically as needed. 30 g 0  . omeprazole (PRILOSEC) 40 MG capsule Take 1 capsule (40 mg total) by mouth daily. 90 capsule 4  . ondansetron (ZOFRAN) 4 MG tablet Take 1 tablet (4 mg total) by mouth every 8 (eight) hours as needed. 40 tablet 2  . polyethylene glycol powder (MIRALAX) powder Take 17 g by mouth daily. 255 g 11  . TRULANCE 3 MG TABS Take 1 tablet by mouth daily.    . cyclobenzaprine (FLEXERIL) 10 MG tablet Take 10 mg by mouth 2 (two) times daily as needed for muscle spasms. (Patient not taking: No sig reported)    . lidocaine (XYLOCAINE) 2 % solution Use as directed 15 mLs in the mouth or throat every 6 (six) hours as needed for mouth pain. Do not eat/drink within 60 minutes of taking this medication (Patient not taking: No sig reported) 100 mL 0  . oxyCODONE-acetaminophen (PERCOCET/ROXICET) 5-325 MG tablet Take 1 tablet by mouth every 4 (four) hours as needed for severe pain. (Patient not taking: No sig reported) 30 tablet 0  . prochlorperazine (COMPAZINE) 10 MG tablet Take 1 tablet (10 mg total) by mouth every 6 (six) hours as needed. (Patient not taking: No sig reported) 30 tablet 2  . sucralfate (CARAFATE) 1 g tablet Take 1 tablet (1 g total) by mouth 4 (four) times daily. Dissolve each tablet in 15 cc water before use. (Patient not taking: No sig reported) 120 tablet 2  . zolpidem (AMBIEN CR) 12.5 MG CR tablet Take 1 tablet (12.5 mg total) by mouth at bedtime as needed for sleep. (Patient not taking: No sig reported) 30 tablet 5   No current facility-administered medications for this encounter.    ALLERGIES:  Allergies  Allergen Reactions  . Penicillins Other (See Comments)    Childhood allergy.  Has patient had a PCN reaction causing immediate rash, facial/tongue/throat swelling, SOB or lightheadedness with hypotension: unknown Has patient had a PCN reaction causing severe rash involving mucus membranes or skin necrosis:  unknown Has patient had a PCN reaction that required hospitalization: unknown Has patient had a PCN reaction occurring within the last 10 years: no If all of the above answers are "NO", then may proceed with Cephalosporin use.    Physical exam: Unable to assess given encounter type.  Impression/Plan: 1. Stage IV, cT3, N0, M1c, non-small cell lung cancer,  adenocarcinoma of the right lower lobe with progressive brain disease. We reviewed his MRI scan and the mixed response in the brain. Dr. Lisbeth Renshaw and Dr. Mickeal Skinner recommend proceeding with SRS to these new lesions in the brain. Fortunately they very small and should not be causing symptoms. We reviewed the rationale for simulation tomorrow after his infusion and will start working on a treatment date for single fraction treatment. We reviewed the risks, benefits, short, and long term effects of treatment as well as delivery and logistics. He will simulate tomorrow at which time he will sign written consent.  The patient will also continue under the care of Dr. Julien Nordmann with his systemic Keytruda. I advised him to consider his interests in driving. As long as he does not have seizures, legally he can drive, but I suggested he drive for short distances during the day and accompanied by a passenger early on in venturing back out and driving. 2. Vasogenic edema around the primary cerebellar metastasis.  Thankfully this has improved. We will follow his symptoms expectantly.   Given current concerns for patient exposure during the COVID-19 pandemic, this encounter was conducted via telephone.  The patient has provided two factor identification and has given verbal consent for this type of encounter and has been advised to only accept a meeting of this type in a secure network environment. The time spent during this encounter was 35 minutes including preparation, discussion, and coordination of the patient's care. The attendants for this meeting include Hayden Pedro  and BENEDICT KUE and his wife Dalan Cowger.   Hayden Pedro was located at [facility] Radiation Oncology Department.  Keatyn Jawad and his wife Vaughan Basta were located at home    Carola Rhine, [facility]

## 2020-06-09 ENCOUNTER — Ambulatory Visit
Admission: RE | Admit: 2020-06-09 | Discharge: 2020-06-09 | Disposition: A | Discharge status: Home / Self Care | Payer: Managed Care, Other (non HMO) | Source: Ambulatory Visit | Attending: Radiation Oncology | Admitting: Radiation Oncology

## 2020-06-09 ENCOUNTER — Other Ambulatory Visit: Payer: Self-pay

## 2020-06-09 ENCOUNTER — Inpatient Hospital Stay: Payer: Managed Care, Other (non HMO)

## 2020-06-09 ENCOUNTER — Inpatient Hospital Stay (HOSPITAL_BASED_OUTPATIENT_CLINIC_OR_DEPARTMENT_OTHER): Payer: Managed Care, Other (non HMO) | Admitting: Internal Medicine

## 2020-06-09 ENCOUNTER — Encounter: Payer: Self-pay | Admitting: Internal Medicine

## 2020-06-09 DIAGNOSIS — I700 Atherosclerosis of aorta: Secondary | ICD-10-CM | POA: Insufficient documentation

## 2020-06-09 DIAGNOSIS — K219 Gastro-esophageal reflux disease without esophagitis: Secondary | ICD-10-CM | POA: Diagnosis not present

## 2020-06-09 DIAGNOSIS — R1310 Dysphagia, unspecified: Secondary | ICD-10-CM | POA: Diagnosis not present

## 2020-06-09 DIAGNOSIS — C3491 Malignant neoplasm of unspecified part of right bronchus or lung: Secondary | ICD-10-CM

## 2020-06-09 DIAGNOSIS — Z510 Encounter for antineoplastic radiation therapy: Secondary | ICD-10-CM | POA: Diagnosis not present

## 2020-06-09 DIAGNOSIS — C3490 Malignant neoplasm of unspecified part of unspecified bronchus or lung: Secondary | ICD-10-CM

## 2020-06-09 DIAGNOSIS — R531 Weakness: Secondary | ICD-10-CM | POA: Diagnosis not present

## 2020-06-09 DIAGNOSIS — C7931 Secondary malignant neoplasm of brain: Secondary | ICD-10-CM | POA: Insufficient documentation

## 2020-06-09 DIAGNOSIS — K589 Irritable bowel syndrome without diarrhea: Secondary | ICD-10-CM | POA: Insufficient documentation

## 2020-06-09 DIAGNOSIS — Z79899 Other long term (current) drug therapy: Secondary | ICD-10-CM | POA: Insufficient documentation

## 2020-06-09 DIAGNOSIS — C3431 Malignant neoplasm of lower lobe, right bronchus or lung: Secondary | ICD-10-CM | POA: Diagnosis not present

## 2020-06-09 DIAGNOSIS — E039 Hypothyroidism, unspecified: Secondary | ICD-10-CM | POA: Insufficient documentation

## 2020-06-09 DIAGNOSIS — Z8719 Personal history of other diseases of the digestive system: Secondary | ICD-10-CM | POA: Insufficient documentation

## 2020-06-09 DIAGNOSIS — J439 Emphysema, unspecified: Secondary | ICD-10-CM | POA: Insufficient documentation

## 2020-06-09 DIAGNOSIS — F1010 Alcohol abuse, uncomplicated: Secondary | ICD-10-CM | POA: Insufficient documentation

## 2020-06-09 DIAGNOSIS — Z5112 Encounter for antineoplastic immunotherapy: Secondary | ICD-10-CM | POA: Diagnosis not present

## 2020-06-09 DIAGNOSIS — E785 Hyperlipidemia, unspecified: Secondary | ICD-10-CM | POA: Insufficient documentation

## 2020-06-09 DIAGNOSIS — I2510 Atherosclerotic heart disease of native coronary artery without angina pectoris: Secondary | ICD-10-CM | POA: Diagnosis not present

## 2020-06-09 DIAGNOSIS — C7949 Secondary malignant neoplasm of other parts of nervous system: Secondary | ICD-10-CM

## 2020-06-09 DIAGNOSIS — Z95828 Presence of other vascular implants and grafts: Secondary | ICD-10-CM

## 2020-06-09 LAB — CBC WITH DIFFERENTIAL (CANCER CENTER ONLY)
Abs Immature Granulocytes: 0.06 10*3/uL (ref 0.00–0.07)
Basophils Absolute: 0.1 10*3/uL (ref 0.0–0.1)
Basophils Relative: 1 %
Eosinophils Absolute: 0.2 10*3/uL (ref 0.0–0.5)
Eosinophils Relative: 2 %
HCT: 37.7 % — ABNORMAL LOW (ref 39.0–52.0)
Hemoglobin: 11.8 g/dL — ABNORMAL LOW (ref 13.0–17.0)
Immature Granulocytes: 1 %
Lymphocytes Relative: 17 %
Lymphs Abs: 1.3 10*3/uL (ref 0.7–4.0)
MCH: 31.6 pg (ref 26.0–34.0)
MCHC: 31.3 g/dL (ref 30.0–36.0)
MCV: 100.8 fL — ABNORMAL HIGH (ref 80.0–100.0)
Monocytes Absolute: 0.8 10*3/uL (ref 0.1–1.0)
Monocytes Relative: 11 %
Neutro Abs: 5.5 10*3/uL (ref 1.7–7.7)
Neutrophils Relative %: 68 %
Platelet Count: 226 10*3/uL (ref 150–400)
RBC: 3.74 MIL/uL — ABNORMAL LOW (ref 4.22–5.81)
RDW: 13.4 % (ref 11.5–15.5)
WBC Count: 7.9 10*3/uL (ref 4.0–10.5)
nRBC: 0 % (ref 0.0–0.2)

## 2020-06-09 LAB — CMP (CANCER CENTER ONLY)
ALT: 27 U/L (ref 0–44)
AST: 25 U/L (ref 15–41)
Albumin: 3.4 g/dL — ABNORMAL LOW (ref 3.5–5.0)
Alkaline Phosphatase: 48 U/L (ref 38–126)
Anion gap: 12 (ref 5–15)
BUN: 6 mg/dL (ref 6–20)
CO2: 22 mmol/L (ref 22–32)
Calcium: 9 mg/dL (ref 8.9–10.3)
Chloride: 105 mmol/L (ref 98–111)
Creatinine: 0.93 mg/dL (ref 0.61–1.24)
GFR, Estimated: >60 mL/min (ref >60)
Glucose, Bld: 159 mg/dL — ABNORMAL HIGH (ref 70–99)
Potassium: 3.7 mmol/L (ref 3.5–5.1)
Sodium: 139 mmol/L (ref 135–145)
Total Bilirubin: 0.6 mg/dL (ref 0.3–1.2)
Total Protein: 6.8 g/dL (ref 6.5–8.1)

## 2020-06-09 LAB — TSH: TSH: 3.253 u[IU]/mL (ref 0.320–4.118)

## 2020-06-09 MED ORDER — HEPARIN SOD (PORK) LOCK FLUSH 100 UNIT/ML IV SOLN
500.0000 [IU] | Freq: Once | INTRAVENOUS | Status: AC
  Administered 2020-06-09: 500 [IU] via INTRAVENOUS

## 2020-06-09 MED ORDER — SODIUM CHLORIDE 0.9 % IV SOLN
200.0000 mg | Freq: Once | INTRAVENOUS | Status: AC
  Administered 2020-06-09: 200 mg via INTRAVENOUS
  Filled 2020-06-09: qty 8

## 2020-06-09 MED ORDER — SODIUM CHLORIDE 0.9 % IV SOLN
15.0000 mg/kg | Freq: Once | INTRAVENOUS | Status: AC
  Administered 2020-06-09: 1200 mg via INTRAVENOUS
  Filled 2020-06-09: qty 48

## 2020-06-09 MED ORDER — SODIUM CHLORIDE 0.9% FLUSH
10.0000 mL | Freq: Once | INTRAVENOUS | Status: AC
  Administered 2020-06-09: 10 mL
  Filled 2020-06-09: qty 10

## 2020-06-09 MED ORDER — SODIUM CHLORIDE 0.9 % IV SOLN
Freq: Once | INTRAVENOUS | Status: AC
  Filled 2020-06-09: qty 250

## 2020-06-09 MED ORDER — SODIUM CHLORIDE 0.9% FLUSH
10.0000 mL | Freq: Once | INTRAVENOUS | Status: AC
  Administered 2020-06-09: 10 mL via INTRAVENOUS

## 2020-06-09 MED ORDER — SODIUM CHLORIDE 0.9% FLUSH
10.0000 mL | INTRAVENOUS | Status: DC | PRN
  Filled 2020-06-09: qty 10

## 2020-06-09 MED ORDER — HEPARIN SOD (PORK) LOCK FLUSH 100 UNIT/ML IV SOLN
500.0000 [IU] | Freq: Once | INTRAVENOUS | Status: DC | PRN
  Filled 2020-06-09: qty 5

## 2020-06-09 NOTE — Patient Instructions (Signed)
Steps to Quit Smoking Smoking tobacco is the leading cause of preventable death. It can affect almost every organ in the body. Smoking puts you and people around you at risk for many serious, long-lasting (chronic) diseases. Quitting smoking can be hard, but it is one of the best things that you can do for your health. It is never too late to quit. How do I get ready to quit? When you decide to quit smoking, make a plan to help you succeed. Before you quit:  Pick a date to quit. Set a date within the next 2 weeks to give you time to prepare.  Write down the reasons why you are quitting. Keep this list in places where you will see it often.  Tell your family, friends, and co-workers that you are quitting. Their support is important.  Talk with your doctor about the choices that may help you quit.  Find out if your health insurance will pay for these treatments.  Know the people, places, things, and activities that make you want to smoke (triggers). Avoid them. What first steps can I take to quit smoking?  Throw away all cigarettes at home, at work, and in your car.  Throw away the things that you use when you smoke, such as ashtrays and lighters.  Clean your car. Make sure to empty the ashtray.  Clean your home, including curtains and carpets. What can I do to help me quit smoking? Talk with your doctor about taking medicines and seeing a counselor at the same time. You are more likely to succeed when you do both.  If you are pregnant or breastfeeding, talk with your doctor about counseling or other ways to quit smoking. Do not take medicine to help you quit smoking unless your doctor tells you to do so. To quit smoking: Quit right away  Quit smoking totally, instead of slowly cutting back on how much you smoke over a period of time.  Go to counseling. You are more likely to quit if you go to counseling sessions regularly. Take medicine You may take medicines to help you quit. Some  medicines need a prescription, and some you can buy over-the-counter. Some medicines may contain a drug called nicotine to replace the nicotine in cigarettes. Medicines may:  Help you to stop having the desire to smoke (cravings).  Help to stop the problems that come when you stop smoking (withdrawal symptoms). Your doctor may ask you to use:  Nicotine patches, gum, or lozenges.  Nicotine inhalers or sprays.  Non-nicotine medicine that is taken by mouth. Find resources Find resources and other ways to help you quit smoking and remain smoke-free after you quit. These resources are most helpful when you use them often. They include:  Online chats with a counselor.  Phone quitlines.  Printed self-help materials.  Support groups or group counseling.  Text messaging programs.  Mobile phone apps. Use apps on your mobile phone or tablet that can help you stick to your quit plan. There are many free apps for mobile phones and tablets as well as websites. Examples include Quit Guide from the CDC and smokefree.gov   What things can I do to make it easier to quit?  Talk to your family and friends. Ask them to support and encourage you.  Call a phone quitline (1-800-QUIT-NOW), reach out to support groups, or work with a counselor.  Ask people who smoke to not smoke around you.  Avoid places that make you want to smoke,   such as: ? Bars. ? Parties. ? Smoke-break areas at work.  Spend time with people who do not smoke.  Lower the stress in your life. Stress can make you want to smoke. Try these things to help your stress: ? Getting regular exercise. ? Doing deep-breathing exercises. ? Doing yoga. ? Meditating. ? Doing a body scan. To do this, close your eyes, focus on one area of your body at a time from head to toe. Notice which parts of your body are tense. Try to relax the muscles in those areas.   How will I feel when I quit smoking? Day 1 to 3 weeks Within the first 24 hours,  you may start to have some problems that come from quitting tobacco. These problems are very bad 2-3 days after you quit, but they do not often last for more than 2-3 weeks. You may get these symptoms:  Mood swings.  Feeling restless, nervous, angry, or annoyed.  Trouble concentrating.  Dizziness.  Strong desire for high-sugar foods and nicotine.  Weight gain.  Trouble pooping (constipation).  Feeling like you may vomit (nausea).  Coughing or a sore throat.  Changes in how the medicines that you take for other issues work in your body.  Depression.  Trouble sleeping (insomnia). Week 3 and afterward After the first 2-3 weeks of quitting, you may start to notice more positive results, such as:  Better sense of smell and taste.  Less coughing and sore throat.  Slower heart rate.  Lower blood pressure.  Clearer skin.  Better breathing.  Fewer sick days. Quitting smoking can be hard. Do not give up if you fail the first time. Some people need to try a few times before they succeed. Do your best to stick to your quit plan, and talk with your doctor if you have any questions or concerns. Summary  Smoking tobacco is the leading cause of preventable death. Quitting smoking can be hard, but it is one of the best things that you can do for your health.  When you decide to quit smoking, make a plan to help you succeed.  Quit smoking right away, not slowly over a period of time.  When you start quitting, seek help from your doctor, family, or friends. This information is not intended to replace advice given to you by your health care provider. Make sure you discuss any questions you have with your health care provider. Document Revised: 01/04/2019 Document Reviewed: 06/30/2018 [employer] Patient Education  2021 [employer]  

## 2020-06-09 NOTE — Patient Instructions (Signed)
[facility] Discharge Instructions for Patients Receiving Chemotherapy  Today you received the following chemotherapy agents bevacizumab, pembrolizumab  To help prevent nausea and vomiting after your treatment, we encourage you to take your nausea medication as directed.   If you develop nausea and vomiting that is not controlled by your nausea medication, call the clinic.   BELOW ARE SYMPTOMS THAT SHOULD BE REPORTED IMMEDIATELY:  *FEVER GREATER THAN 100.5 F  *CHILLS WITH OR WITHOUT FEVER  NAUSEA AND VOMITING THAT IS NOT CONTROLLED WITH YOUR NAUSEA MEDICATION  *UNUSUAL SHORTNESS OF BREATH  *UNUSUAL BRUISING OR BLEEDING  TENDERNESS IN MOUTH AND THROAT WITH OR WITHOUT PRESENCE OF ULCERS  *URINARY PROBLEMS  *BOWEL PROBLEMS  UNUSUAL RASH Items with * indicate a potential emergency and should be followed up as soon as possible.  Feel free to call the clinic should you have any questions or concerns. The clinic phone number is [phone].  Please show the [place] at check-in to the Emergency Department and triage nurse.

## 2020-06-09 NOTE — Progress Notes (Signed)
[facility] Telephone:([phone]   [phone]  OFFICE PROGRESS NOTE  [name], NP [address] [name] [address] [place] [address]  DIAGNOSIS: Stage IV (T3, N0, M1C) non-small cell lung cancer, adenocarcinoma.The patient presented with a right lower lobe/infrahilar mass as well as a solitary brain metastasis in the left cerebellum. He was diagnosed in July 2021.  Molecular Biomarkers:  MSI-High DETECTED Pembrolizumab Atezolizumab, Avelumab, Cemiplimab, Dostarlimab, Durvalumab, Ipilimumab, Nivolumab  STK11Splice Site SNV 5.6% Everolimus, Temsirolimus Yes  [id number] 1.7% Binimetinib Yes  [id number] 0.4%  Niraparib, Olaparib, Rucaparib, Talazoparib, Tazemetostat Yes  PRIOR THERAPY:  1) SRS to the solitary brain metastasis under the care of Dr. Lisbeth Renshaw. Last treatment 11/14/19. 2) Weekly concurrent chemoradiation with carboplatin for an AUC of 2, paclitaxel 45 mg/m2.First dose expected on 11/25/2019. Status post 7 cycles, last dose was giving 01/06/2020 with partial response.   CURRENT THERAPY:  1)  Immunotherapy with Keytruda 200 mg IV every 3 weeks.  First dose February 10, 2020 for a patient with MSI high.  Status post 4 cycles. 2) Avastin 15 mg/KG every 3 weeks.  First dose today for the vasogenic edema of the brain.S/P 3 cycles.  INTERVAL HISTORY: Joseph Atkins 60 y.o. male returns to the clinic today for follow-up visit accompanied by his wife.  The patient is feeling fine today with no concerning complaints except for the weakness in his lower extremities.  He completely tapered his Decadron a week ago.  He has occasional headache in the morning and he takes ibuprofen.  He denied having any current chest pain, shortness of breath, cough or hemoptysis.  He has no nausea, vomiting, diarrhea or constipation.  He has no skin rash.  He had MRI of the brain performed recently that showed improvement of his disease in the brain  with decrease in the vasogenic edema but there was few new lesions that is followed by Dr. Lisbeth Renshaw.  The patient is here today for evaluation before starting cycle #5 of his treatment.  MEDICAL HISTORY: Past Medical History:  Diagnosis Date  . Cancer ([place])    lung cancer  . Diverticulosis   . GERD (gastroesophageal reflux disease)   . Hx of small bowel obstruction   . Hyperlipidemia   . Hypothyroidism   . IBS (irritable bowel syndrome)   . Substance abuse ([facility])    Alcoholic, Drug addition  . Thyroid disease     ALLERGIES:  is allergic to penicillins.  MEDICATIONS:  Current Outpatient Medications  Medication Sig Dispense Refill  . albuterol (PROVENTIL HFA;VENTOLIN HFA) 108 (90 Base) MCG/ACT inhaler Inhale 2 puffs into the lungs every 6 (six) hours as needed for wheezing or shortness of breath. 1 Inhaler 0  . ANORO ELLIPTA 62.5-25 MCG/INH AEPB 1 puff daily.    . cyclobenzaprine (FLEXERIL) 10 MG tablet Take 10 mg by mouth 2 (two) times daily as needed for muscle spasms. (Patient not taking: No sig reported)    . dexamethasone (DECADRON) 2 MG tablet Take 2 tablets (4 mg total) by mouth 2 (two) times daily. (Patient taking differently: Take 2 mg by mouth 2 (two) times daily.) 120 tablet 2  . docusate sodium (COLACE) 100 MG capsule Take 100 mg by mouth daily.    . DULoxetine (CYMBALTA) 20 MG capsule Take 1 capsule (20 mg total) by mouth 2 (two) times daily. 60 capsule 5  . fluticasone furoate-vilanterol (BREO ELLIPTA) 100-25 MCG/INH AEPB Inhale 1 puff into the lungs daily.  30 each 3  . levothyroxine (SYNTHROID) 75 MCG tablet Take 75 mcg by mouth daily.    Marland Kitchen lidocaine (XYLOCAINE) 2 % solution Use as directed 15 mLs in the mouth or throat every 6 (six) hours as needed for mouth pain. Do not eat/drink within 60 minutes of taking this medication (Patient not taking: No sig reported) 100 mL 0  . lidocaine-prilocaine (EMLA) cream Apply 1 application topically as needed. 30 g 0  . omeprazole  (PRILOSEC) 40 MG capsule Take 1 capsule (40 mg total) by mouth daily. 90 capsule 4  . ondansetron (ZOFRAN) 4 MG tablet Take 1 tablet (4 mg total) by mouth every 8 (eight) hours as needed. 40 tablet 2  . oxyCODONE-acetaminophen (PERCOCET/ROXICET) 5-325 MG tablet Take 1 tablet by mouth every 4 (four) hours as needed for severe pain. (Patient not taking: No sig reported) 30 tablet 0  . polyethylene glycol powder (MIRALAX) powder Take 17 g by mouth daily. 255 g 11  . prochlorperazine (COMPAZINE) 10 MG tablet Take 1 tablet (10 mg total) by mouth every 6 (six) hours as needed. (Patient not taking: No sig reported) 30 tablet 2  . sucralfate (CARAFATE) 1 g tablet Take 1 tablet (1 g total) by mouth 4 (four) times daily. Dissolve each tablet in 15 cc water before use. (Patient not taking: No sig reported) 120 tablet 2  . TRULANCE 3 MG TABS Take 1 tablet by mouth daily.    Marland Kitchen zolpidem (AMBIEN CR) 12.5 MG CR tablet Take 1 tablet (12.5 mg total) by mouth at bedtime as needed for sleep. (Patient not taking: No sig reported) 30 tablet 5   No current facility-administered medications for this visit.    SURGICAL HISTORY:  Past Surgical History:  Procedure Laterality Date  . arm surgery Right   . BRONCHIAL BRUSHINGS  10/24/2019   Procedure: BRONCHIAL BRUSHINGS;  Surgeon: Collene Gobble, MD;  Location: [facility] ENDOSCOPY;  Service: Cardiopulmonary;;  right lower lobe   . BRONCHIAL BRUSHINGS  11/05/2019   Procedure: BRONCHIAL BRUSHINGS;  Surgeon: Collene Gobble, MD;  Location: [facility] ENDOSCOPY;  Service: Pulmonary;;  . BRONCHIAL NEEDLE ASPIRATION BIOPSY  10/24/2019   Procedure: BRONCHIAL NEEDLE ASPIRATION BIOPSIES;  Surgeon: Collene Gobble, MD;  Location: [facility];  Service: Cardiopulmonary;;  . BRONCHIAL NEEDLE ASPIRATION BIOPSY  11/05/2019   Procedure: BRONCHIAL NEEDLE ASPIRATION BIOPSIES;  Surgeon: Collene Gobble, MD;  Location: [facility] ENDOSCOPY;  Service: Pulmonary;;  . ENDOBRONCHIAL ULTRASOUND N/A 10/24/2019    Procedure: ENDOBRONCHIAL ULTRASOUND;  Surgeon: Collene Gobble, MD;  Location: [facility];  Service: Cardiopulmonary;  Laterality: N/A;  . FINGER SURGERY Right    Middle  . IR IMAGING GUIDED PORT INSERTION  11/19/2019  . VIDEO BRONCHOSCOPY N/A 10/24/2019   Procedure: VIDEO BRONCHOSCOPY WITHOUT FLUORO;  Surgeon: Collene Gobble, MD;  Location[facility] ENDOSCOPY;  Service: Cardiopulmonary;  Laterality: N/A;  . VIDEO BRONCHOSCOPY WITH ENDOBRONCHIAL NAVIGATION N/A 11/05/2019   Procedure: VIDEO BRONCHOSCOPY WITH ENDOBRONCHIAL NAVIGATION;  Surgeon: Collene Gobble, MD;  Location: [facility] ENDOSCOPY;  Service: Pulmonary;  Laterality: N/A;    REVIEW OF SYSTEMS:  A comprehensive review of systems was negative except for: Constitutional: positive for fatigue Musculoskeletal: positive for muscle weakness   PHYSICAL EXAMINATION: General appearance: alert, cooperative, fatigued and no distress Head: Normocephalic, without obvious abnormality, atraumatic Neck: no adenopathy, no JVD, supple, symmetrical, trachea midline and thyroid not enlarged, symmetric, no tenderness/mass/nodules Lymph nodes: Cervical, supraclavicular, and axillary nodes normal. Resp: clear to auscultation bilaterally Back: symmetric, no  curvature. ROM normal. No CVA tenderness. Cardio: regular rate and rhythm, S1, S2 normal, no murmur, click, rub or gallop GI: soft, non-tender; bowel sounds normal; no masses,  no organomegaly Extremities: extremities normal, atraumatic, no cyanosis or edema  ECOG PERFORMANCE STATUS: 1 - Symptomatic but completely ambulatory  Blood pressure (!) 142/84, pulse 99, temperature 97.7 F (36.5 C), temperature source Tympanic, resp. rate 15, height $RemoveBe'5\' 8"'jqezkMtza$  (1.727 m), weight 191 lb 1.6 oz (86.7 kg), SpO2 99 %.  LABORATORY DATA: Lab Results  Component Value Date   WBC 7.9 06/09/2020   HGB 11.8 (L) 06/09/2020   HCT 37.7 (L) 06/09/2020   MCV 100.8 (H) 06/09/2020   PLT 226 06/09/2020      Chemistry      Component  Value Date/Time   NA 140 05/19/2020 0750   NA 141 03/08/2017 1707   K 4.0 05/19/2020 0750   CL 107 05/19/2020 0750   CO2 24 05/19/2020 0750   BUN 8 05/19/2020 0750   BUN 12 03/08/2017 1707   CREATININE 0.88 05/19/2020 0750      Component Value Date/Time   CALCIUM 8.9 05/19/2020 0750   ALKPHOS 50 05/19/2020 0750   AST 20 05/19/2020 0750   ALT 24 05/19/2020 0750   BILITOT 0.5 05/19/2020 0750       RADIOGRAPHIC STUDIES: CT Chest W Contrast  Result Date: 05/15/2020 CLINICAL DATA:  Primary Cancer Type: Lung Imaging Indication: Assess response to therapy Interval therapy since last imaging? Yes Initial Cancer Diagnosis Date: 11/05/2019; Established by: Biopsy-proven Detailed Pathology: Stage IV non-small cell lung cancer, adenocarcinoma. Primary Tumor location: Right lower lobe. Solitary brain metastasis. Surgeries: No. Chemotherapy: Yes; Ongoing? Yes; Most recent administration: 04/28/2020 Immunotherapy?  Yes; Type: Keytruda; Ongoing? Yes Radiation therapy? Yes Date Range: 03/05/2020; Target: Brain metastasis Date Range: 11/25/2019 - 01/09/2020; Target: Right lung Date Range: 11/14/2019; Target: Cerebellar metastasis EXAM: CT CHEST, ABDOMEN, AND PELVIS WITH CONTRAST TECHNIQUE: Multidetector CT imaging of the chest, abdomen and pelvis was performed following the standard protocol during bolus administration of intravenous contrast. CONTRAST:  192mL OMNIPAQUE IOHEXOL 300 MG/ML  SOLN COMPARISON:  Most recent CT chest 01/31/2020. 11/13/2019 PET-CT. CT chest, abdomen and pelvis 10/18/2019. FINDINGS: CT CHEST FINDINGS Cardiovascular: Right Port-A-Cath tip high right atrium. Aortic atherosclerosis. Normal heart size, without pericardial effusion. Lad coronary artery calcification. No central pulmonary embolism, on this non-dedicated study. Mediastinum/Nodes: No supraclavicular adenopathy. No mediastinal or hilar adenopathy. Lungs/Pleura: No pleural fluid.  Moderate centrilobular emphysema. Central right  lower lobe lung lesion measures 2.7 x 2.2 cm on 126/7. Compare 3.3 x 2.4 cm on the prior exam. Adjacent or contiguous more cephalad nodularity at 1.7 x 1.4 cm on 120/7 versus 2.2 x 12.0 cm on the prior exam (when remeasured). Multifocal right greater than left paramediastinal airspace and ground-glass is presumably radiation induced, new. New bilateral pulmonary nodules. In the left upper lobe at 5 mm on 78/7, solid. A left lower lobe cavitary nodule is new at 7 mm on 120/7. Minimally cavitary nodulein the posterior right upper lobe at 7 mm on 62/7. 2 mm left upper lobe pulmonary nodule on 62/7. Musculoskeletal: No acute osseous abnormality. CT ABDOMEN PELVIS FINDINGS Hepatobiliary: Segment 2 hypoattenuation at 9 mm on 62/2, present back to 2018 and considered benign. Normal gallbladder, without biliary ductal dilatation. Pancreas: Normal, without mass or ductal dilatation. Spleen: Normal in size, without focal abnormality. Adrenals/Urinary Tract: Normal adrenal glands. Normal kidneys, without hydronephrosis. Normal urinary bladder. Stomach/Bowel: Proximal gastric underdistention. Normal colon and terminal ileum.  Normal small bowel. Vascular/Lymphatic: Advanced aortic and branch vessel atherosclerosis. Left common and external iliac artery occlusion with reconstitution by the left inferior epigastric. Retroaortic left renal vein. No abdominopelvic adenopathy. Reproductive: Normal prostate. Other: No significant free fluid. Musculoskeletal: No acute osseous abnormality. IMPRESSION: 1. Since 01/31/2020, decreased size of right lower lobe lung lesion. 2. New bilateral pulmonary nodules, highly suspicious for metastatic disease. 3. No thoracic adenopathy. 4. No evidence of metastatic disease in the abdomen or pelvis. 5. Left common and external iliac artery occlusion with reconstitution via distal collaterals. New since the most recent abdominal CT of 10/18/2019. 6. Aortic atherosclerosis (ICD10-I70.0), coronary artery  atherosclerosis and emphysema (ICD10-J43.9). 7. Electronically Signed   By: Abigail Miyamoto M.D.   On: 05/15/2020 10:36   MR Brain W Wo Contrast  Result Date: 06/05/2020 CLINICAL DATA:  Brain metastases.  Surveillance. EXAM: MRI HEAD WITHOUT AND WITH CONTRAST TECHNIQUE: Multiplanar, multiecho pulse sequences of the brain and surrounding structures were obtained without and with intravenous contrast. CONTRAST:  81mL MULTIHANCE GADOBENATE DIMEGLUMINE 529 MG/ML IV SOLN COMPARISON:  MRI of the brain February 21, 2020. FINDINGS: Brain: No acute infarction, hemorrhage, hydrocephalus or extra-axial collection. Scattered foci of T2 hyperintensity are seen within the white matter of the cerebral hemispheres, nonspecific, unchanged from prior. Multiple enhancing lesions consistent with metastatic disease to the brain are described below and labeled on series 11. Left inferior cerebellar hemisphere lesion (image 27) appear larger than on prior MRI, measuring approximately 2.9 x 2.5 cm (2.7 x 2.4 cm on prior). However, rim enhancement appears thinner and without sulcal enhancement seen prior and significant improvement of the surrounding vasogenic edema with resolution of the mass effect on the fourth ventricle. Smaller lesion: *Left posterior frontal lobe, 6 mm (9 mm on prior), with resolution of the surrounding vasogenic edema, image 137; *Right precentral gyrus, 6 mm (7 mm on prior) with near resolution of the surrounding vasogenic edema, image 125; *Right thalamus, 1-2 mm (3 mm on prior), no surrounding edema, image 87; *Left occipital lobe, faint 1 mm (6 mm on prior), no surrounding edema, image 80; *Left occipital lobe, 1 mm (2 mm on prior), no significant edema, image 64; *Left occipital lobe, 4 mm (5 mm on prior), no significant edema, image 63; *Right temporal lobe lesion is no longer seen. New lesions: *Left middle frontal gyrus, 1 mm, no edema, image 126; *Right parietal lobe, 3 mm, no significant surrounding edema,  image 100; *Left frontal burr:, tumor, no significant surrounding edema, image 89; *Right occipital lobe, 3 mm, no surrounding edema, image 75; *Right occipital lobe, 3 mm, no surrounding edema, image 65; *Right inferior cerebellar hemisphere, 4 mm, no associated edema, image 24. Vascular: Normal flow voids. Skull and upper cervical spine: Normal marrow signal. Sinuses/Orbits: Mucous retention cyst within the right maxillary sinus. Mucosal thickening of the left sphenoid sinus. The orbits are maintained. Other: Minimal bilateral mastoid effusion. IMPRESSION: 1. Interval decrease in thickness of the rim enhancement associated with left inferior cerebellar hemisphere lesion with decreased surrounding vasogenic edema with resolution of sulcal enhancement and mass effect which indicate treatment response despite larger lesion size. 2. Otherwise, mixed treatment response with decreased size of multiple lesions and identification of new enhancing lesions. Electronically Signed   By: Pedro Earls M.D.   On: 06/05/2020 10:57   CT Abdomen Pelvis W Contrast  Result Date: 05/15/2020 CLINICAL DATA:  Primary Cancer Type: Lung Imaging Indication: Assess response to therapy Interval therapy since last imaging? Yes Initial  Cancer Diagnosis Date: 11/05/2019; Established by: Biopsy-proven Detailed Pathology: Stage IV non-small cell lung cancer, adenocarcinoma. Primary Tumor location: Right lower lobe. Solitary brain metastasis. Surgeries: No. Chemotherapy: Yes; Ongoing? Yes; Most recent administration: 04/28/2020 Immunotherapy?  Yes; Type: Keytruda; Ongoing? Yes Radiation therapy? Yes Date Range: 03/05/2020; Target: Brain metastasis Date Range: 11/25/2019 - 01/09/2020; Target: Right lung Date Range: 11/14/2019; Target: Cerebellar metastasis EXAM: CT CHEST, ABDOMEN, AND PELVIS WITH CONTRAST TECHNIQUE: Multidetector CT imaging of the chest, abdomen and pelvis was performed following the standard protocol during  bolus administration of intravenous contrast. CONTRAST:  176mL OMNIPAQUE IOHEXOL 300 MG/ML  SOLN COMPARISON:  Most recent CT chest 01/31/2020. 11/13/2019 PET-CT. CT chest, abdomen and pelvis 10/18/2019. FINDINGS: CT CHEST FINDINGS Cardiovascular: Right Port-A-Cath tip high right atrium. Aortic atherosclerosis. Normal heart size, without pericardial effusion. Lad coronary artery calcification. No central pulmonary embolism, on this non-dedicated study. Mediastinum/Nodes: No supraclavicular adenopathy. No mediastinal or hilar adenopathy. Lungs/Pleura: No pleural fluid.  Moderate centrilobular emphysema. Central right lower lobe lung lesion measures 2.7 x 2.2 cm on 126/7. Compare 3.3 x 2.4 cm on the prior exam. Adjacent or contiguous more cephalad nodularity at 1.7 x 1.4 cm on 120/7 versus 2.2 x 12.0 cm on the prior exam (when remeasured). Multifocal right greater than left paramediastinal airspace and ground-glass is presumably radiation induced, new. New bilateral pulmonary nodules. In the left upper lobe at 5 mm on 78/7, solid. A left lower lobe cavitary nodule is new at 7 mm on 120/7. Minimally cavitary nodulein the posterior right upper lobe at 7 mm on 62/7. 2 mm left upper lobe pulmonary nodule on 62/7. Musculoskeletal: No acute osseous abnormality. CT ABDOMEN PELVIS FINDINGS Hepatobiliary: Segment 2 hypoattenuation at 9 mm on 62/2, present back to 2018 and considered benign. Normal gallbladder, without biliary ductal dilatation. Pancreas: Normal, without mass or ductal dilatation. Spleen: Normal in size, without focal abnormality. Adrenals/Urinary Tract: Normal adrenal glands. Normal kidneys, without hydronephrosis. Normal urinary bladder. Stomach/Bowel: Proximal gastric underdistention. Normal colon and terminal ileum. Normal small bowel. Vascular/Lymphatic: Advanced aortic and branch vessel atherosclerosis. Left common and external iliac artery occlusion with reconstitution by the left inferior epigastric.  Retroaortic left renal vein. No abdominopelvic adenopathy. Reproductive: Normal prostate. Other: No significant free fluid. Musculoskeletal: No acute osseous abnormality. IMPRESSION: 1. Since 01/31/2020, decreased size of right lower lobe lung lesion. 2. New bilateral pulmonary nodules, highly suspicious for metastatic disease. 3. No thoracic adenopathy. 4. No evidence of metastatic disease in the abdomen or pelvis. 5. Left common and external iliac artery occlusion with reconstitution via distal collaterals. New since the most recent abdominal CT of 10/18/2019. 6. Aortic atherosclerosis (ICD10-I70.0), coronary artery atherosclerosis and emphysema (ICD10-J43.9). 7. Electronically Signed   By: Abigail Miyamoto M.D.   On: 05/15/2020 10:36    ASSESSMENT AND PLAN: This is a very pleasant 60 years old white male with a stage IV (t3, N0, M1c) non-small cell lung cancer, adenocarcinoma with MSI high presented with right lower lobe/infrahilar mass in addition to solitary brain metastasis in the left cerebellum diagnosed in July 2021. He is status post SRS to the solitary brain metastasis. The patient completed a course of concurrent chemoradiation with weekly carboplatin and paclitaxel.  He tolerated the treatment well except for fatigue and mild odynophagia. He had repeat CT scan of the chest performed recently.  I personally and independently reviewed the scans and discussed the results with the patient and his wife. His scan showed interval decrease in the volume of the right infrahilar mass  with no other evidence of metastatic disease. The patient has MSI high and I recommended for him treatment with immunotherapy with single agent Keytruda 200 mg IV every 3 weeks for a total of 2 years unless the patient has unacceptable toxicity or disease progression. He is status post 4 cycles of treatment with Keytruda.  He also received 2 cycles of Avastin for the vasogenic edema in the brain.  The patient tolerated the  previous treatment well. I recommended for the patient to proceed with cycle #5 today as planned. I will see him back for follow-up visit in 3 weeks with repeat CT scan of the chest for restaging of his disease. For the metastatic brain lesions, he is followed by Dr. Mickeal Skinner and Dr. Lisbeth Renshaw.  We will continue with Avastin for the vasogenic edema at this point.  Is currently off Decadron. The patient was advised to call immediately if he has any other concerning symptoms in the interval.  The patient voices understanding of current disease status and treatment options and is in agreement with the current care plan.  All questions were answered. The patient knows to call the clinic with any problems, questions or concerns. We can certainly see the patient much sooner if necessary.  Disclaimer: This note was dictated with voice recognition software. Similar sounding words can inadvertently be transcribed and may not be corrected upon review.

## 2020-06-10 ENCOUNTER — Telehealth: Payer: Self-pay | Admitting: Internal Medicine

## 2020-06-10 NOTE — Telephone Encounter (Signed)
appts requested in 2/15 los were already on pt's schedule from previous LOS. Made no changes to pt's schedule.

## 2020-06-18 DIAGNOSIS — Z5112 Encounter for antineoplastic immunotherapy: Secondary | ICD-10-CM | POA: Diagnosis not present

## 2020-06-19 ENCOUNTER — Ambulatory Visit
Admission: RE | Admit: 2020-06-19 | Discharge: 2020-06-19 | Disposition: A | Discharge status: Home / Self Care | Payer: Managed Care, Other (non HMO) | Source: Ambulatory Visit | Attending: Radiation Oncology | Admitting: Radiation Oncology

## 2020-06-19 ENCOUNTER — Encounter: Payer: Self-pay | Admitting: Radiation Oncology

## 2020-06-19 DIAGNOSIS — Z5112 Encounter for antineoplastic immunotherapy: Secondary | ICD-10-CM | POA: Diagnosis not present

## 2020-06-19 NOTE — Progress Notes (Signed)
Joseph Atkins rested with [name] for 30 minutes following his SRS treatment.  Patient denies headache, dizziness, nausea, diplopia or ringing in the ears. Denies fatigue. Patient without complaints. Understands to avoid strenuous activity for the next 24 hours and call [phone] with needs.   BP (!) 171/107   Pulse 91   Temp (!) 97.2 F (36.2 C)   Resp 18   SpO2 100%    [name] M. [name], BSN

## 2020-06-19 NOTE — Progress Notes (Signed)
  Name: BARACK NICODEMUS  MRN: [id number]  Date: 06/19/2020   DOB: 1961-01-20  Stereotactic Radiosurgery Operative Note  PRE-OPERATIVE DIAGNOSIS:  Multiple Brain Metastases  POST-OPERATIVE DIAGNOSIS:  Multiple Brain Metastases  PROCEDURE:  Stereotactic Radiosurgery  SURGEON:  Earleen Newport, MD  NARRATIVE: The patient underwent a radiation treatment planning session in the radiation oncology simulation suite under the care of the radiation oncology physician and physicist.  I participated closely in the radiation treatment planning afterwards. The patient underwent planning CT which was fused to 3T high resolution MRI with 1 mm axial slices.  These images were fused on the planning system.  We contoured the gross target volumes and subsequently expanded this to yield the Planning Target Volume. I actively participated in the planning process.  I helped to define and review the target contours and also the contours of the optic pathway, eyes, brainstem and selected nearby organs at risk.  All the dose constraints for critical structures were reviewed and compared to AAPM Task Group 101.  The prescription dose conformity was reviewed.  I approved the plan electronically.    Accordingly, [name] was brought to the TrueBeam stereotactic radiation treatment linac and placed in the custom immobilization mask.  The patient was aligned according to the IR fiducial markers with BrainLab Exactrac, then orthogonal x-rays were used in ExacTrac with the 6DOF robotic table and the shifts were made to align the patient  [name] received stereotactic radiosurgery uneventfully.    Lesions treated:  6   Complex lesions treated:  0 (>3.5 cm, <59mm of optic path, or within the brainstem)   The detailed description of the procedure is recorded in the radiation oncology procedure note.  I was present for the duration of the procedure.  DISPOSITION:  Following delivery, the patient was transported  to nursing in stable condition and monitored for possible acute effects to be discharged to home in stable condition with follow-up in one month.  Earleen Newport, MD 06/19/2020 3:00 PM

## 2020-06-22 NOTE — Progress Notes (Signed)
  Patient Name: Joseph Atkins MRN: [id number] DOB: Apr 29, 1960 Referring Physician: Kristeen Miss, M.D. Date of Service: 06/19/2020 [facility], [place]                                                        End Of Treatment Note  Diagnoses: C34.31-Malignant neoplasm of lower lobe, right bronchus or lung C79.31-Secondary malignant neoplasm of brain  Cancer Staging: Stage IV, cT3, N0, M1c, non-small cell lung cancer, adenocarcinoma of the right lower lobe.  Intent: Palliative  Radiation Treatment Dates: 06/19/2020 through 06/19/2020 Site Technique Total Dose (Gy) Dose per Fx (Gy) Completed Fx Beam Energies  Brain: Brain  PTV_14 Rt Cereb 36mm PTV_13 Rt Occ 30mm PTV_12 Rt Occ 51mm PTV_11 Lt Front 72mm PTV_10 Rt Par 70mm PTV_9 Lt Par 26mm IMRT 20/20 20 1/1 6XFFF   Narrative: The patient tolerated radiation therapy relatively well.   Plan: The patient will receive a call in about one month from the radiation oncology department. He will continue follow up in the brain oncology program and will have another MRI in about 3 months. He will also follow up with Dr. Julien Nordmann in medical oncology.   ________________________________________________    Carola Rhine, PAC

## 2020-06-29 ENCOUNTER — Ambulatory Visit (HOSPITAL_COMMUNITY)
Admission: RE | Admit: 2020-06-29 | Discharge: 2020-06-29 | Disposition: A | Discharge status: Home / Self Care | Payer: Managed Care, Other (non HMO) | Source: Ambulatory Visit | Attending: Internal Medicine | Admitting: Internal Medicine

## 2020-06-29 ENCOUNTER — Encounter (HOSPITAL_COMMUNITY): Payer: Self-pay

## 2020-06-29 ENCOUNTER — Other Ambulatory Visit: Payer: Self-pay

## 2020-06-29 DIAGNOSIS — C3490 Malignant neoplasm of unspecified part of unspecified bronchus or lung: Secondary | ICD-10-CM | POA: Insufficient documentation

## 2020-06-29 HISTORY — DX: Essential (primary) hypertension: I10

## 2020-06-29 IMAGING — CT CT CHEST W/ CM
2 of 4 series · 15 of 36 positions shown, 18 images · IV contrast (omnipaque)
Comparison: [DATE]

CLINICAL DATA: Non-small cell lung cancer staging, ongoing
chemotherapy and immunotherapy

EXAM:
CT CHEST WITH CONTRAST
TECHNIQUE: Multidetector CT imaging of the chest was performed during
intravenous contrast administration.
CONTRAST:  75mL OMNIPAQUE IOHEXOL 300 MG/ML  SOLN

[Series 2: axial st · axial · 0.84mm/px · z∈[-158,+114]mm · 12 of 162 slices shown, 15 images]
[im 13/162  mediastinal]
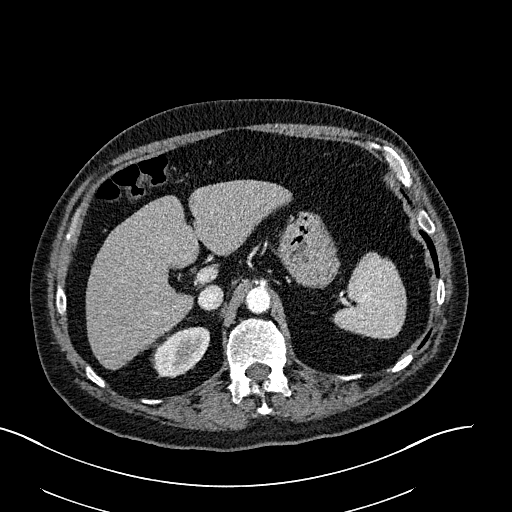
[im 13/162  lung]
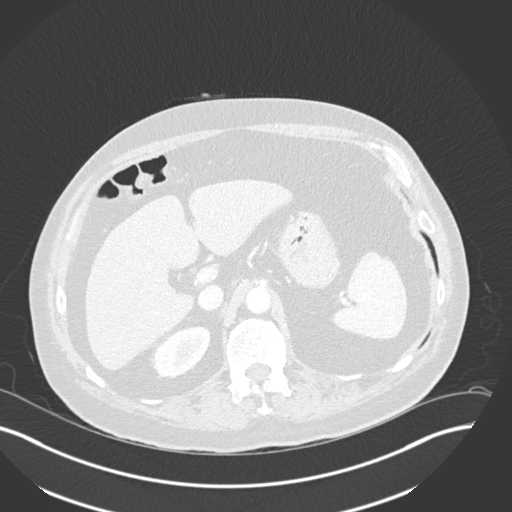
[im 25/162  lung]
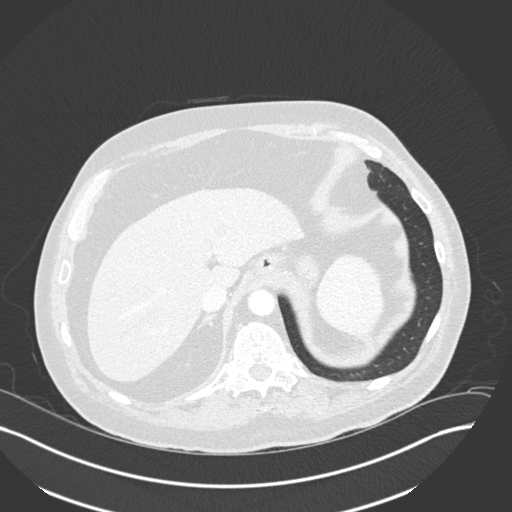
[im 38/162  lung]
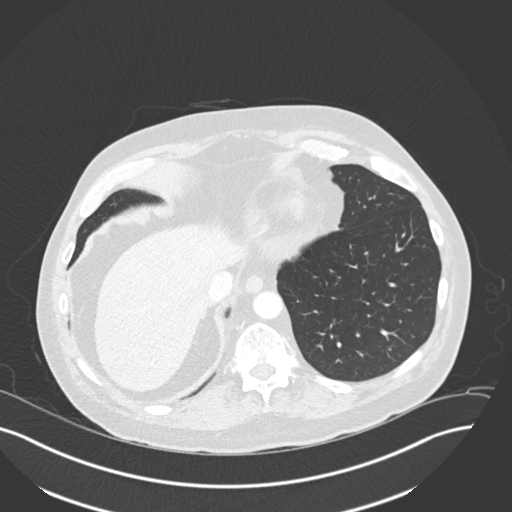
[im 50/162  lung]
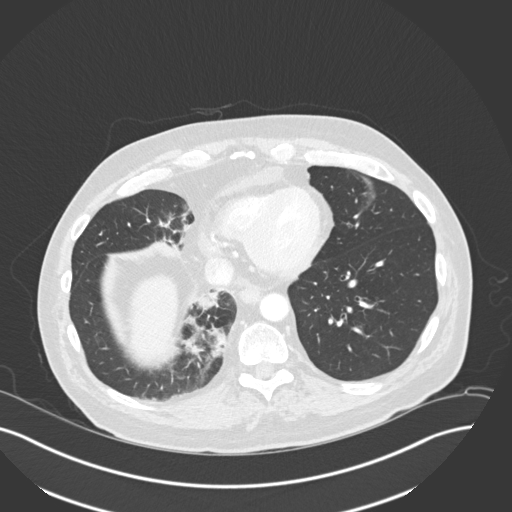
[im 62/162  mediastinal]
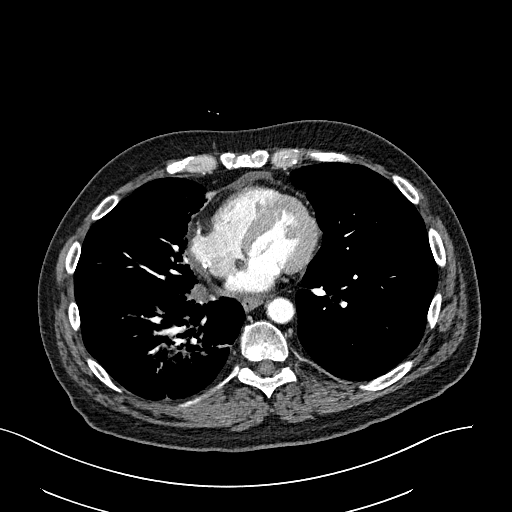
[im 62/162  lung]
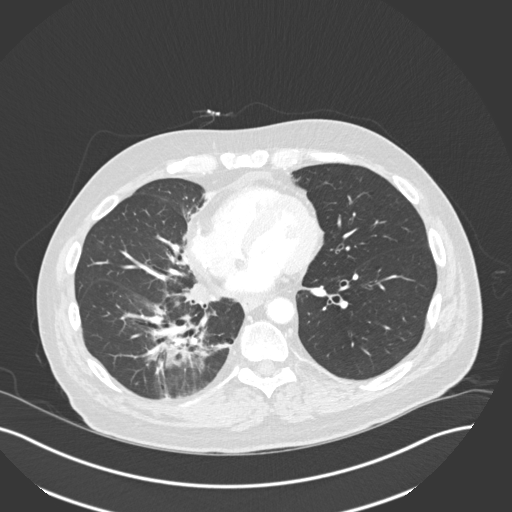
[im 75/162  lung]
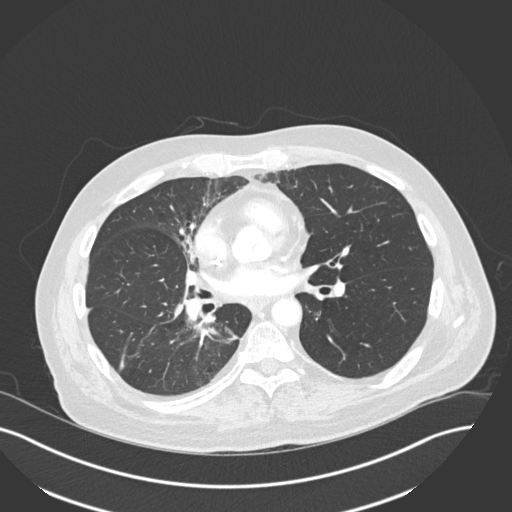
[im 87/162  lung]
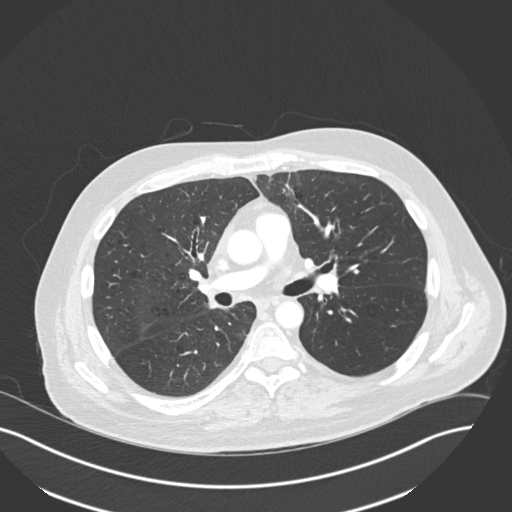
[im 100/162  lung]
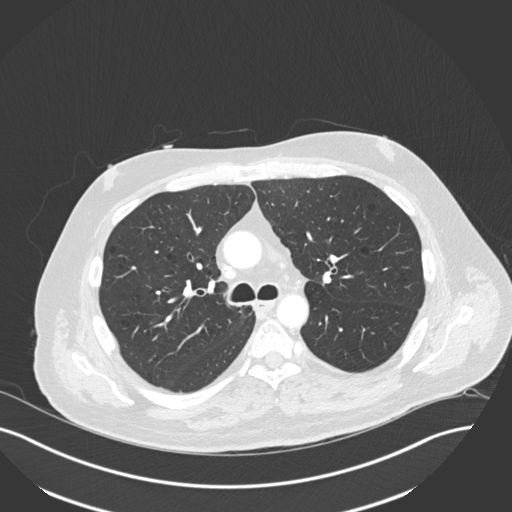
[im 112/162  mediastinal]
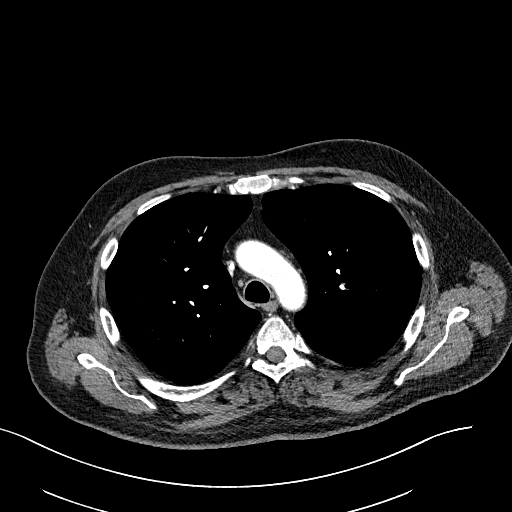
[im 112/162  lung]
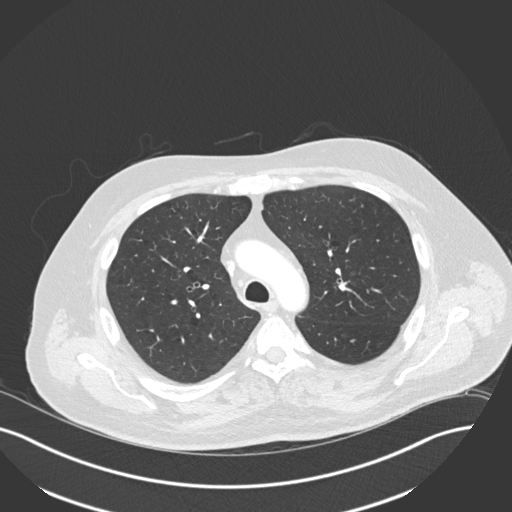
[im 124/162  lung]
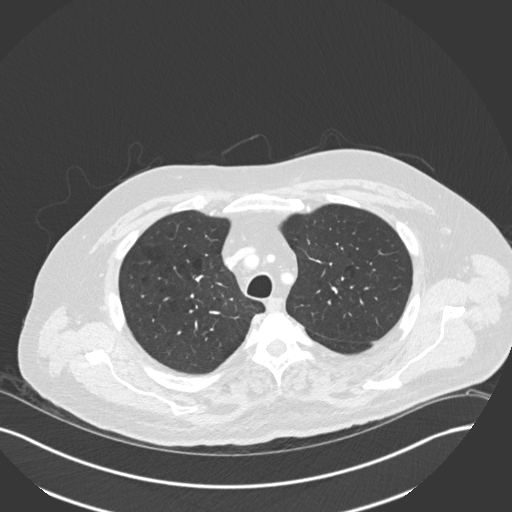
[im 137/162  lung]
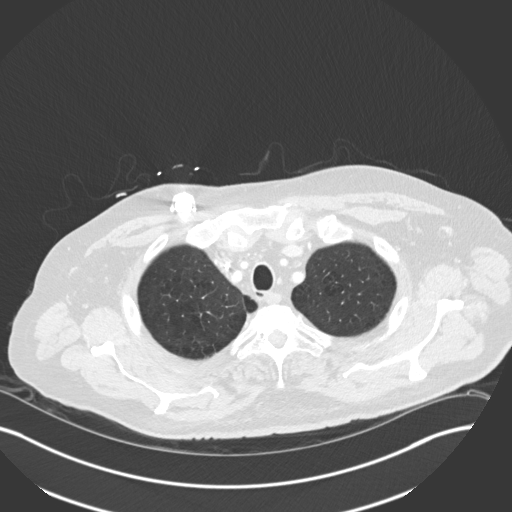
[im 149/162  lung]
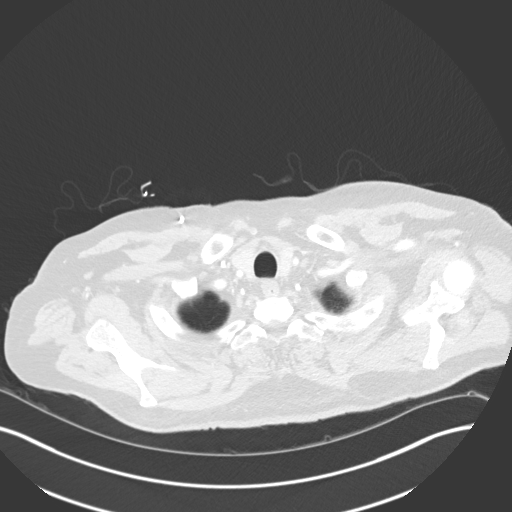

[Series 6: coronal · coronal · 0.63mm/px · 3 of 144 slices shown]
[im 29/144  lung]
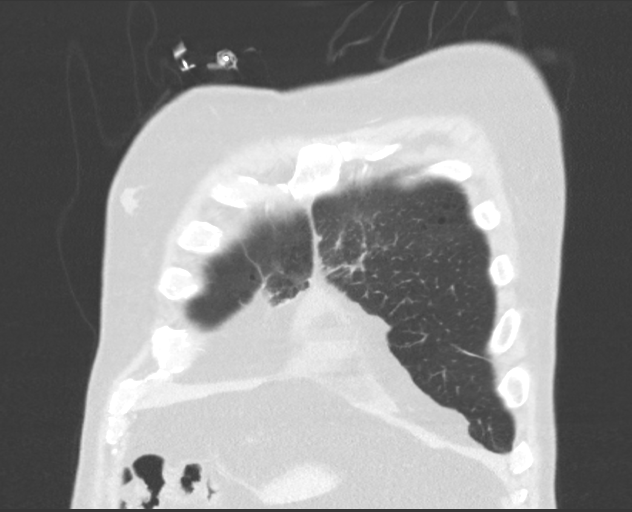
[im 58/144  lung]
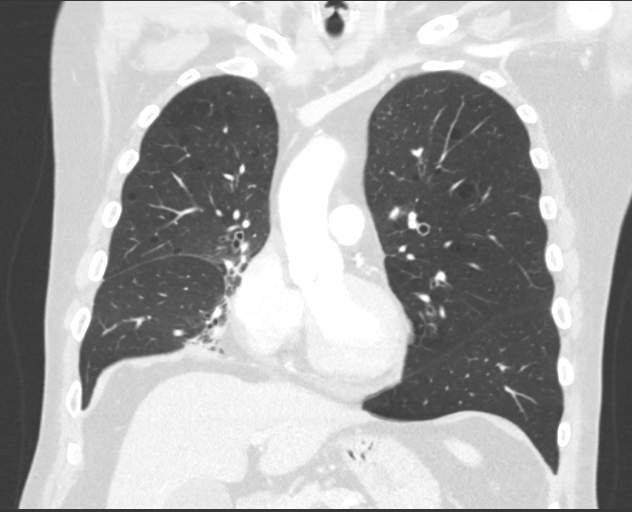
[im 86/144  lung]
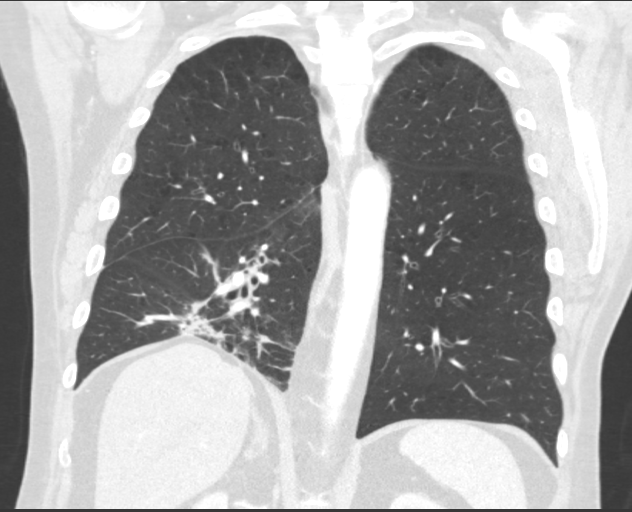

[15 of 36 positions shown; findings below may reference images not displayed]

FINDINGS: Cardiovascular: Right chest port catheter. Scattered aortic
atherosclerosis. Normal heart size. Left and right coronary artery
calcifications. No pericardial effusion.

Mediastinum/Nodes: No enlarged mediastinal, hilar, or axillary lymph
nodes. Thyroid gland, trachea, and esophagus demonstrate no
significant findings.

Lungs/Pleura: No significant change in size of spiculated mass of
the medial right lower lobe measuring 2.8 x 2.0 cm (series 5, image
108). Interval increase in irregular heterogeneous and ground-glass
airspace opacity, architectural distortion, and bronchiectasis in
this vicinity. Multiple small pulmonary nodules, new on prior
examination, are diminished in size and solidity, an index cavitary
nodule of the left lower lobe now measuring approximately 3 mm,
previously 6 mm (series 5, image 109) and of the medial right upper
lobe measuring 4 mm, previously 7 mm (series 5, image 43). Mild
centrilobular emphysema. No pleural effusion or pneumothorax.

Upper Abdomen: No acute abnormality.

Musculoskeletal: No chest wall mass or suspicious bone lesions
identified.
IMPRESSION: 1. No significant change in size of spiculated mass of the medial
right lower lobe measuring 2.8 x 2.0 cm. Interval increase in
irregular heterogeneous and ground-glass airspace opacity,
architectural distortion, and bronchiectasis in this vicinity,
consistent with developing radiation fibrosis.
2. Multiple small pulmonary nodules, new on prior examination, are
diminished in size and solidity, an index cavitary nodule of the
left lower lobe now measuring approximately 3 mm, previously 6 mm
and of the medial right upper lobe measuring 4 mm, previously 7 mm.
Findings are consistent with treatment response of small metastases
or alternately resolving nonspecific infection or inflammation.
3. Emphysema.
4. Coronary artery disease.
5. Aortic atherosclerosis.

Aortic Atherosclerosis ([G5]-[G5]) and Emphysema ([G5]-[G5]).

## 2020-06-29 MED ORDER — HEPARIN SOD (PORK) LOCK FLUSH 100 UNIT/ML IV SOLN
500.0000 [IU] | Freq: Once | INTRAVENOUS | Status: AC
  Administered 2020-06-29: 500 [IU] via INTRAVENOUS

## 2020-06-29 MED ORDER — IOHEXOL 300 MG/ML  SOLN
75.0000 mL | Freq: Once | INTRAMUSCULAR | Status: AC | PRN
  Administered 2020-06-29: 75 mL via INTRAVENOUS

## 2020-06-29 MED ORDER — HEPARIN SOD (PORK) LOCK FLUSH 100 UNIT/ML IV SOLN
INTRAVENOUS | Status: AC
  Filled 2020-06-29: qty 5

## 2020-06-30 ENCOUNTER — Inpatient Hospital Stay: Payer: Managed Care, Other (non HMO) | Attending: Internal Medicine

## 2020-06-30 ENCOUNTER — Inpatient Hospital Stay (HOSPITAL_BASED_OUTPATIENT_CLINIC_OR_DEPARTMENT_OTHER): Payer: Managed Care, Other (non HMO) | Admitting: Internal Medicine

## 2020-06-30 ENCOUNTER — Other Ambulatory Visit: Payer: Self-pay | Admitting: Internal Medicine

## 2020-06-30 ENCOUNTER — Telehealth: Payer: Self-pay

## 2020-06-30 ENCOUNTER — Inpatient Hospital Stay: Payer: Managed Care, Other (non HMO)

## 2020-06-30 ENCOUNTER — Other Ambulatory Visit: Payer: Self-pay

## 2020-06-30 VITALS — BP 149/94 | HR 90 | Temp 97.8°F | Resp 20

## 2020-06-30 VITALS — BP 129/91 | HR 107 | Temp 97.7°F | Resp 16 | Ht 68.0 in | Wt 182.0 lb

## 2020-06-30 DIAGNOSIS — C3491 Malignant neoplasm of unspecified part of right bronchus or lung: Secondary | ICD-10-CM

## 2020-06-30 DIAGNOSIS — R0602 Shortness of breath: Secondary | ICD-10-CM | POA: Insufficient documentation

## 2020-06-30 DIAGNOSIS — E785 Hyperlipidemia, unspecified: Secondary | ICD-10-CM | POA: Diagnosis not present

## 2020-06-30 DIAGNOSIS — Z5112 Encounter for antineoplastic immunotherapy: Secondary | ICD-10-CM

## 2020-06-30 DIAGNOSIS — I10 Essential (primary) hypertension: Secondary | ICD-10-CM | POA: Insufficient documentation

## 2020-06-30 DIAGNOSIS — C7931 Secondary malignant neoplasm of brain: Secondary | ICD-10-CM | POA: Insufficient documentation

## 2020-06-30 DIAGNOSIS — Z95828 Presence of other vascular implants and grafts: Secondary | ICD-10-CM

## 2020-06-30 DIAGNOSIS — K219 Gastro-esophageal reflux disease without esophagitis: Secondary | ICD-10-CM | POA: Insufficient documentation

## 2020-06-30 DIAGNOSIS — C3431 Malignant neoplasm of lower lobe, right bronchus or lung: Secondary | ICD-10-CM | POA: Diagnosis not present

## 2020-06-30 DIAGNOSIS — E039 Hypothyroidism, unspecified: Secondary | ICD-10-CM | POA: Insufficient documentation

## 2020-06-30 DIAGNOSIS — R5383 Other fatigue: Secondary | ICD-10-CM | POA: Insufficient documentation

## 2020-06-30 DIAGNOSIS — G936 Cerebral edema: Secondary | ICD-10-CM | POA: Diagnosis not present

## 2020-06-30 DIAGNOSIS — K589 Irritable bowel syndrome without diarrhea: Secondary | ICD-10-CM | POA: Diagnosis not present

## 2020-06-30 DIAGNOSIS — Z5111 Encounter for antineoplastic chemotherapy: Secondary | ICD-10-CM | POA: Diagnosis not present

## 2020-06-30 DIAGNOSIS — Z79899 Other long term (current) drug therapy: Secondary | ICD-10-CM | POA: Diagnosis not present

## 2020-06-30 LAB — CBC WITH DIFFERENTIAL (CANCER CENTER ONLY)
Abs Immature Granulocytes: 0.06 10*3/uL (ref 0.00–0.07)
Basophils Absolute: 0.1 10*3/uL (ref 0.0–0.1)
Basophils Relative: 1 %
Eosinophils Absolute: 0.2 10*3/uL (ref 0.0–0.5)
Eosinophils Relative: 2 %
HCT: 42.1 % (ref 39.0–52.0)
Hemoglobin: 13.2 g/dL (ref 13.0–17.0)
Immature Granulocytes: 1 %
Lymphocytes Relative: 16 %
Lymphs Abs: 1.6 10*3/uL (ref 0.7–4.0)
MCH: 30.6 pg (ref 26.0–34.0)
MCHC: 31.4 g/dL (ref 30.0–36.0)
MCV: 97.5 fL (ref 80.0–100.0)
Monocytes Absolute: 1.2 10*3/uL — ABNORMAL HIGH (ref 0.1–1.0)
Monocytes Relative: 12 %
Neutro Abs: 6.9 10*3/uL (ref 1.7–7.7)
Neutrophils Relative %: 68 %
Platelet Count: 267 10*3/uL (ref 150–400)
RBC: 4.32 MIL/uL (ref 4.22–5.81)
RDW: 13.2 % (ref 11.5–15.5)
WBC Count: 10.1 10*3/uL (ref 4.0–10.5)
nRBC: 0 % (ref 0.0–0.2)

## 2020-06-30 LAB — CMP (CANCER CENTER ONLY)
ALT: 16 U/L (ref 0–44)
AST: 24 U/L (ref 15–41)
Albumin: 3.6 g/dL (ref 3.5–5.0)
Alkaline Phosphatase: 44 U/L (ref 38–126)
Anion gap: 14 (ref 5–15)
BUN: 10 mg/dL (ref 6–20)
CO2: 19 mmol/L — ABNORMAL LOW (ref 22–32)
Calcium: 9.8 mg/dL (ref 8.9–10.3)
Chloride: 104 mmol/L (ref 98–111)
Creatinine: 1.56 mg/dL — ABNORMAL HIGH (ref 0.61–1.24)
GFR, Estimated: 51 mL/min — ABNORMAL LOW (ref >60)
Glucose, Bld: 103 mg/dL — ABNORMAL HIGH (ref 70–99)
Potassium: 3.1 mmol/L — ABNORMAL LOW (ref 3.5–5.1)
Sodium: 137 mmol/L (ref 135–145)
Total Bilirubin: 0.4 mg/dL (ref 0.3–1.2)
Total Protein: 7.1 g/dL (ref 6.5–8.1)

## 2020-06-30 LAB — TSH: TSH: <0.08 u[IU]/mL — ABNORMAL LOW (ref 0.320–4.118)

## 2020-06-30 MED ORDER — SODIUM CHLORIDE 0.9 % IV SOLN
Freq: Once | INTRAVENOUS | Status: AC
  Filled 2020-06-30: qty 250

## 2020-06-30 MED ORDER — SODIUM CHLORIDE 0.9% FLUSH
10.0000 mL | INTRAVENOUS | Status: DC | PRN
  Administered 2020-06-30: 10 mL
  Filled 2020-06-30: qty 10

## 2020-06-30 MED ORDER — SODIUM CHLORIDE 0.9 % IV SOLN
200.0000 mg | Freq: Once | INTRAVENOUS | Status: AC
  Administered 2020-06-30: 200 mg via INTRAVENOUS
  Filled 2020-06-30: qty 8

## 2020-06-30 MED ORDER — SODIUM CHLORIDE 0.9% FLUSH
10.0000 mL | Freq: Once | INTRAVENOUS | Status: AC
  Administered 2020-06-30: 10 mL
  Filled 2020-06-30: qty 10

## 2020-06-30 MED ORDER — HEPARIN SOD (PORK) LOCK FLUSH 100 UNIT/ML IV SOLN
500.0000 [IU] | Freq: Once | INTRAVENOUS | Status: AC | PRN
  Administered 2020-06-30: 500 [IU]
  Filled 2020-06-30: qty 5

## 2020-06-30 MED ORDER — SODIUM CHLORIDE 0.9 % IV SOLN
15.0000 mg/kg | Freq: Once | INTRAVENOUS | Status: AC
  Administered 2020-06-30: 1200 mg via INTRAVENOUS
  Filled 2020-06-30: qty 48

## 2020-06-30 NOTE — Patient Instructions (Signed)
[place] Discharge Instructions for Patients Receiving Chemotherapy  Today you received the following immunotherapy agents: Pembrolizumab (Keytruda) and  Bevacizumab   To help prevent nausea and vomiting after your treatment, we encourage you to take your nausea medication as directed by your MD.   If you develop nausea and vomiting that is not controlled by your nausea medication, call the clinic.   BELOW ARE SYMPTOMS THAT SHOULD BE REPORTED IMMEDIATELY:  *FEVER GREATER THAN 100.5 F  *CHILLS WITH OR WITHOUT FEVER  NAUSEA AND VOMITING THAT IS NOT CONTROLLED WITH YOUR NAUSEA MEDICATION  *UNUSUAL SHORTNESS OF BREATH  *UNUSUAL BRUISING OR BLEEDING  TENDERNESS IN MOUTH AND THROAT WITH OR WITHOUT PRESENCE OF ULCERS  *URINARY PROBLEMS  *BOWEL PROBLEMS  UNUSUAL RASH Items with * indicate a potential emergency and should be followed up as soon as possible.  Feel free to call the clinic should you have any questions or concerns. The clinic phone number is [phone].  Please show the [place] at check-in to the Emergency Department and triage nurse.

## 2020-06-30 NOTE — Telephone Encounter (Signed)
Per Dr. Julien Nordmann it is ok to treat today with heart rate 107

## 2020-06-30 NOTE — Progress Notes (Signed)
[place] [phone]   [phone]  OFFICE PROGRESS NOTE  [name], NP [address] [name] [address] [place] [address]  DIAGNOSIS: Stage IV (T3, N0, M1C) non-small cell lung cancer, adenocarcinoma.The patient presented with a right lower lobe/infrahilar mass as well as a solitary brain metastasis in the left cerebellum. He was diagnosed in July 2021.  Molecular Biomarkers:  MSI-High DETECTED Pembrolizumab Atezolizumab, Avelumab, Cemiplimab, Dostarlimab, Durvalumab, Ipilimumab, Nivolumab  STK11Splice Site SNV 8.1% Everolimus, Temsirolimus Yes  [id number] 1.7% Binimetinib Yes  [id number] 0.4%  Niraparib, Olaparib, Rucaparib, Talazoparib, Tazemetostat Yes  PRIOR THERAPY:  1) SRS to the solitary brain metastasis under the care of Dr. Lisbeth Renshaw. Last treatment 11/14/19. 2) Weekly concurrent chemoradiation with carboplatin for an AUC of 2, paclitaxel 45 mg/m2.First dose expected on 11/25/2019. Status post 7 cycles, last dose was giving 01/06/2020 with partial response.   CURRENT THERAPY:  1)  Immunotherapy with Keytruda 200 mg IV every 3 weeks.  First dose February 10, 2020 for a patient with MSI high.  Status post 5 cycles. 2) Avastin 15 mg/KG every 3 weeks.  First dose today for the vasogenic edema of the brain.S/P 3 cycles.  INTERVAL HISTORY: Joseph Atkins 60 y.o. male returns to the clinic today for follow-up visit accompanied by his wife.  The patient is feeling fine today with no concerning complaints except for mild fatigue and shortness of breath with exertion.  He lost a lot of weight after discontinuing the tapered dose of Decadron.  He denied having any current chest pain, cough or hemoptysis.  He denied having any fever or chills.  He denied having any headache or visual changes.  The patient has mild nausea but no vomiting, diarrhea or constipation.  He had repeat CT scan of the chest performed recently and he  is here for evaluation and discussion of his scan results.  MEDICAL HISTORY: Past Medical History:  Diagnosis Date  . Cancer ([facility])    lung cancer  . Diverticulosis   . GERD (gastroesophageal reflux disease)   . Hx of small bowel obstruction   . Hyperlipidemia   . Hypertension   . Hypothyroidism   . IBS (irritable bowel syndrome)   . Substance abuse ([facility])    Alcoholic, Drug addition  . Thyroid disease     ALLERGIES:  is allergic to penicillins.  MEDICATIONS:  Current Outpatient Medications  Medication Sig Dispense Refill  . albuterol (PROVENTIL HFA;VENTOLIN HFA) 108 (90 Base) MCG/ACT inhaler Inhale 2 puffs into the lungs every 6 (six) hours as needed for wheezing or shortness of breath. 1 Inhaler 0  . ANORO ELLIPTA 62.5-25 MCG/INH AEPB 1 puff daily.    . cyclobenzaprine (FLEXERIL) 10 MG tablet Take 10 mg by mouth 2 (two) times daily as needed for muscle spasms. (Patient not taking: No sig reported)    . dexamethasone (DECADRON) 2 MG tablet Take 2 tablets (4 mg total) by mouth 2 (two) times daily. (Patient taking differently: Take 2 mg by mouth 2 (two) times daily.) 120 tablet 2  . docusate sodium (COLACE) 100 MG capsule Take 100 mg by mouth daily.    . DULoxetine (CYMBALTA) 20 MG capsule Take 1 capsule (20 mg total) by mouth 2 (two) times daily. 60 capsule 5  . fluticasone furoate-vilanterol (BREO ELLIPTA) 100-25 MCG/INH AEPB Inhale 1 puff into the lungs daily. 30 each 3  . levothyroxine (SYNTHROID) 75 MCG tablet Take 75 mcg by mouth daily.    Marland Kitchen  lidocaine (XYLOCAINE) 2 % solution Use as directed 15 mLs in the mouth or throat every 6 (six) hours as needed for mouth pain. Do not eat/drink within 60 minutes of taking this medication (Patient not taking: No sig reported) 100 mL 0  . lidocaine-prilocaine (EMLA) cream Apply 1 application topically as needed. 30 g 0  . omeprazole (PRILOSEC) 40 MG capsule Take 1 capsule (40 mg total) by mouth daily. 90 capsule 4  . ondansetron (ZOFRAN) 4 MG  tablet Take 1 tablet (4 mg total) by mouth every 8 (eight) hours as needed. 40 tablet 2  . oxyCODONE-acetaminophen (PERCOCET/ROXICET) 5-325 MG tablet Take 1 tablet by mouth every 4 (four) hours as needed for severe pain. (Patient not taking: No sig reported) 30 tablet 0  . polyethylene glycol powder (MIRALAX) powder Take 17 g by mouth daily. 255 g 11  . prochlorperazine (COMPAZINE) 10 MG tablet Take 1 tablet (10 mg total) by mouth every 6 (six) hours as needed. (Patient not taking: No sig reported) 30 tablet 2  . sucralfate (CARAFATE) 1 g tablet Take 1 tablet (1 g total) by mouth 4 (four) times daily. Dissolve each tablet in 15 cc water before use. (Patient not taking: No sig reported) 120 tablet 2  . TRULANCE 3 MG TABS Take 1 tablet by mouth daily.    Marland Kitchen zolpidem (AMBIEN CR) 12.5 MG CR tablet Take 1 tablet (12.5 mg total) by mouth at bedtime as needed for sleep. (Patient not taking: No sig reported) 30 tablet 5   No current facility-administered medications for this visit.    SURGICAL HISTORY:  Past Surgical History:  Procedure Laterality Date  . arm surgery Right   . BRONCHIAL BRUSHINGS  10/24/2019   Procedure: BRONCHIAL BRUSHINGS;  Surgeon: Collene Gobble, MD;  Location[facility] END[facility]COPY;  Service: Cardiopulmonary;;  right lower lobe   . BRONCHIAL BRUSHINGS  11/05/2019   Procedure: BRONCHIAL BRUSHINGS;  Surgeon: Collene Gobble, MD;  Location: [facility] ENDOSCOPY;  Service: Pulmonary;;  . BRONCHIAL NEEDLE ASPIRATION BIOPSY  10/24/2019   Procedure: BRONCHIAL NEEDLE ASPIRATION BIOPSIES;  Surgeon: Collene Gobble, MD;  Location: [facility];  Service: Cardiopulmonary;;  . BRONCHIAL NEEDLE ASPIRATION BIOPSY  11/05/2019   Procedure: BRONCHIAL NEEDLE ASPIRATION BIOPSIES;  Surgeon: Collene Gobble, MD;  Location: [facility] ENDOSCOPY;  Service: Pulmonary;;  . ENDOBRONCHIAL ULTRASOUND N/A 10/24/2019   Procedure: ENDOBRONCHIAL ULTRASOUND;  Surgeon: Collene Gobble, MD;  Location: [facility];  Service: Cardiopulmonary;   Laterality: N/A;  . FINGER SURGERY Right    Middle  . IR IMAGING GUIDED PORT INSERTION  11/19/2019  . VIDEO BRONCHOSCOPY N/A 10/24/2019   Procedure: VIDEO BRONCHOSCOPY WITHOUT FLUORO;  Surgeon: Collene Gobble, MD;  Location: [facility] ENDOSCOPY;  Service: Cardiopulmonary;  Laterality: N/A;  . VIDEO BRONCHOSCOPY WITH ENDOBRONCHIAL NAVIGATION N/A 11/05/2019   Procedure: VIDEO BRONCHOSCOPY WITH ENDOBRONCHIAL NAVIGATION;  Surgeon: Collene Gobble, MD;  Location: [facility] ENDOSCOPY;  Service: Pulmonary;  Laterality: N/A;    REVIEW OF SYSTEMS:  Constitutional: positive for fatigue and weight loss Eyes: negative Ears, nose, mouth, throat, and face: negative Respiratory: positive for dyspnea on exertion Cardiovascular: negative Gastrointestinal: negative Genitourinary:negative Integument/breast: negative Hematologic/lymphatic: negative Musculoskeletal:negative Neurological: negative Behavioral/Psych: negative Endocrine: negative Allergic/Immunologic: negative   PHYSICAL EXAMINATION: General appearance: alert, cooperative, fatigued and no distress Head: Normocephalic, without obvious abnormality, atraumatic Neck: no adenopathy, no JVD, supple, symmetrical, trachea midline and thyroid not enlarged, symmetric, no tenderness/mass/nodules Lymph nodes: Cervical, supraclavicular, and axillary nodes normal. Resp: clear to auscultation bilaterally Back:  symmetric, no curvature. ROM normal. No CVA tenderness. Cardio: regular rate and rhythm, S1, S2 normal, no murmur, click, rub or gallop GI: soft, non-tender; bowel sounds normal; no masses,  no organomegaly Extremities: extremities normal, atraumatic, no cyanosis or edema Neurologic: Alert and oriented X 3, normal strength and tone. Normal symmetric reflexes. Normal coordination and gait  ECOG PERFORMANCE STATUS: 1 - Symptomatic but completely ambulatory  Blood pressure (!) 129/91, pulse (!) 107, temperature 97.7 F (36.5 C), temperature source Tympanic, resp.  rate 16, height $RemoveBe'5\' 8"'FnODmGPKn$  (1.727 m), weight 182 lb (82.6 kg), SpO2 98 %.  LABORATORY DATA: Lab Results  Component Value Date   WBC 10.1 06/30/2020   HGB 13.2 06/30/2020   HCT 42.1 06/30/2020   MCV 97.5 06/30/2020   PLT 267 06/30/2020      Chemistry      Component Value Date/Time   NA 139 06/09/2020 0807   NA 141 03/08/2017 1707   K 3.7 06/09/2020 0807   CL 105 06/09/2020 0807   CO2 22 06/09/2020 0807   BUN 6 06/09/2020 0807   BUN 12 03/08/2017 1707   CREATININE 0.93 06/09/2020 0807      Component Value Date/Time   CALCIUM 9.0 06/09/2020 0807   ALKPHOS 48 06/09/2020 0807   AST 25 06/09/2020 0807   ALT 27 06/09/2020 0807   BILITOT 0.6 06/09/2020 0807       RADIOGRAPHIC STUDIES: MR Brain W Wo Contrast  Result Date: 06/05/2020 CLINICAL DATA:  Brain metastases.  Surveillance. EXAM: MRI HEAD WITHOUT AND WITH CONTRAST TECHNIQUE: Multiplanar, multiecho pulse sequences of the brain and surrounding structures were obtained without and with intravenous contrast. CONTRAST:  6mL MULTIHANCE GADOBENATE DIMEGLUMINE 529 MG/ML IV SOLN COMPARISON:  MRI of the brain February 21, 2020. FINDINGS: Brain: No acute infarction, hemorrhage, hydrocephalus or extra-axial collection. Scattered foci of T2 hyperintensity are seen within the white matter of the cerebral hemispheres, nonspecific, unchanged from prior. Multiple enhancing lesions consistent with metastatic disease to the brain are described below and labeled on series 11. Left inferior cerebellar hemisphere lesion (image 27) appear larger than on prior MRI, measuring approximately 2.9 x 2.5 cm (2.7 x 2.4 cm on prior). However, rim enhancement appears thinner and without sulcal enhancement seen prior and significant improvement of the surrounding vasogenic edema with resolution of the mass effect on the fourth ventricle. Smaller lesion: *Left posterior frontal lobe, 6 mm (9 mm on prior), with resolution of the surrounding vasogenic edema, image 137;  *Right precentral gyrus, 6 mm (7 mm on prior) with near resolution of the surrounding vasogenic edema, image 125; *Right thalamus, 1-2 mm (3 mm on prior), no surrounding edema, image 87; *Left occipital lobe, faint 1 mm (6 mm on prior), no surrounding edema, image 80; *Left occipital lobe, 1 mm (2 mm on prior), no significant edema, image 64; *Left occipital lobe, 4 mm (5 mm on prior), no significant edema, image 63; *Right temporal lobe lesion is no longer seen. New lesions: *Left middle frontal gyrus, 1 mm, no edema, image 126; *Right parietal lobe, 3 mm, no significant surrounding edema, image 100; *Left frontal burr:, tumor, no significant surrounding edema, image 89; *Right occipital lobe, 3 mm, no surrounding edema, image 75; *Right occipital lobe, 3 mm, no surrounding edema, image 65; *Right inferior cerebellar hemisphere, 4 mm, no associated edema, image 24. Vascular: Normal flow voids. Skull and upper cervical spine: Normal marrow signal. Sinuses/Orbits: Mucous retention cyst within the right maxillary sinus. Mucosal thickening of the left sphenoid  sinus. The orbits are maintained. Other: Minimal bilateral mastoid effusion. IMPRESSION: 1. Interval decrease in thickness of the rim enhancement associated with left inferior cerebellar hemisphere lesion with decreased surrounding vasogenic edema with resolution of sulcal enhancement and mass effect which indicate treatment response despite larger lesion size. 2. Otherwise, mixed treatment response with decreased size of multiple lesions and identification of new enhancing lesions. Electronically Signed   By: Pedro Earls M.D.   On: 06/05/2020 10:57    ASSESSMENT AND PLAN: This is a very pleasant 60 years old white male with a stage IV (t3, N0, M1c) non-small cell lung cancer, adenocarcinoma with MSI high presented with right lower lobe/infrahilar mass in addition to solitary brain metastasis in the left cerebellum diagnosed in July 2021. He  is status post SRS to the solitary brain metastasis. The patient completed a course of concurrent chemoradiation with weekly carboplatin and paclitaxel.  He tolerated the treatment well except for fatigue and mild odynophagia. He had repeat CT scan of the chest performed recently.  I personally and independently reviewed the scans and discussed the results with the patient and his wife. His scan showed interval decrease in the volume of the right infrahilar mass with no other evidence of metastatic disease. The patient has MSI high and I recommended for him treatment with immunotherapy with single agent Keytruda 200 mg IV every 3 weeks for a total of 2 years unless the patient has unacceptable toxicity or disease progression. He is status post 5 cycles of treatment with Keytruda.  He also received 5 cycles of Avastin for the vasogenic edema in the brain.  The patient tolerated the previous treatment well. The patient had repeat CT scan of the chest performed recently.  I personally and independently reviewed the scan and discussed the results with the patient and his wife. His scan showed improvement of his disease. I recommended for him to continue his current treatment with single agent Keytruda in addition to the Avastin for the vasogenic edema of the brain. I will see him back for follow-up visit in 3 weeks for evaluation before the next cycle of his treatment. For the metastatic brain lesions, he is followed by Dr. Mickeal Skinner and Dr. Lisbeth Renshaw.  We will continue with Avastin for the vasogenic edema at this point.  He is currently off Decadron. The patient was advised to call immediately if he has any concerning symptoms in the interval. The patient voices understanding of current disease status and treatment options and is in agreement with the current care plan.  All questions were answered. The patient knows to call the clinic with any problems, questions or concerns. We can certainly see the patient  much sooner if necessary.  Disclaimer: This note was dictated with voice recognition software. Similar sounding words can inadvertently be transcribed and may not be corrected upon review.

## 2020-07-01 ENCOUNTER — Telehealth: Payer: Self-pay | Admitting: Internal Medicine

## 2020-07-01 NOTE — Telephone Encounter (Signed)
Scheduled per los. Called and left msg. Mailed printout  °

## 2020-07-07 NOTE — Progress Notes (Signed)
  Radiation Oncology         [phone] ________________________________  Name: Joseph Atkins MRN: [id number]  Date of Service: 07/20/2020  DOB: [birth year]  Post Treatment Telephone Note

## 2020-07-20 ENCOUNTER — Inpatient Hospital Stay
Admission: RE | Admit: 2020-07-20 | Discharge: 2020-07-20 | Disposition: A | Discharge status: Home / Self Care | Payer: Managed Care, Other (non HMO) | Source: Ambulatory Visit

## 2020-07-21 ENCOUNTER — Other Ambulatory Visit: Payer: Self-pay | Admitting: Radiation Therapy

## 2020-07-21 ENCOUNTER — Encounter: Payer: Self-pay | Admitting: Internal Medicine

## 2020-07-21 ENCOUNTER — Inpatient Hospital Stay (HOSPITAL_BASED_OUTPATIENT_CLINIC_OR_DEPARTMENT_OTHER): Payer: Managed Care, Other (non HMO) | Admitting: Internal Medicine

## 2020-07-21 ENCOUNTER — Other Ambulatory Visit: Payer: Self-pay

## 2020-07-21 ENCOUNTER — Inpatient Hospital Stay: Payer: Managed Care, Other (non HMO)

## 2020-07-21 ENCOUNTER — Other Ambulatory Visit: Payer: Self-pay | Admitting: Medical Oncology

## 2020-07-21 ENCOUNTER — Other Ambulatory Visit: Payer: Self-pay | Admitting: Internal Medicine

## 2020-07-21 VITALS — HR 94

## 2020-07-21 VITALS — BP 103/82 | HR 106 | Temp 97.9°F | Resp 16 | Ht 68.0 in | Wt 169.6 lb

## 2020-07-21 DIAGNOSIS — I10 Essential (primary) hypertension: Secondary | ICD-10-CM | POA: Diagnosis not present

## 2020-07-21 DIAGNOSIS — C3491 Malignant neoplasm of unspecified part of right bronchus or lung: Secondary | ICD-10-CM | POA: Diagnosis not present

## 2020-07-21 DIAGNOSIS — Z5112 Encounter for antineoplastic immunotherapy: Secondary | ICD-10-CM | POA: Diagnosis not present

## 2020-07-21 DIAGNOSIS — C3431 Malignant neoplasm of lower lobe, right bronchus or lung: Secondary | ICD-10-CM | POA: Diagnosis not present

## 2020-07-21 DIAGNOSIS — C7931 Secondary malignant neoplasm of brain: Secondary | ICD-10-CM

## 2020-07-21 DIAGNOSIS — C7949 Secondary malignant neoplasm of other parts of nervous system: Secondary | ICD-10-CM

## 2020-07-21 DIAGNOSIS — Z95828 Presence of other vascular implants and grafts: Secondary | ICD-10-CM

## 2020-07-21 LAB — CBC WITH DIFFERENTIAL (CANCER CENTER ONLY)
Abs Immature Granulocytes: 0.05 10*3/uL (ref 0.00–0.07)
Basophils Absolute: 0.1 10*3/uL (ref 0.0–0.1)
Basophils Relative: 1 %
Eosinophils Absolute: 0.3 10*3/uL (ref 0.0–0.5)
Eosinophils Relative: 3 %
HCT: 44.9 % (ref 39.0–52.0)
Hemoglobin: 14.6 g/dL (ref 13.0–17.0)
Immature Granulocytes: 1 %
Lymphocytes Relative: 18 %
Lymphs Abs: 1.7 10*3/uL (ref 0.7–4.0)
MCH: 29.9 pg (ref 26.0–34.0)
MCHC: 32.5 g/dL (ref 30.0–36.0)
MCV: 92 fL (ref 80.0–100.0)
Monocytes Absolute: 1.2 10*3/uL — ABNORMAL HIGH (ref 0.1–1.0)
Monocytes Relative: 13 %
Neutro Abs: 5.8 10*3/uL (ref 1.7–7.7)
Neutrophils Relative %: 64 %
Platelet Count: 223 10*3/uL (ref 150–400)
RBC: 4.88 MIL/uL (ref 4.22–5.81)
RDW: 13 % (ref 11.5–15.5)
WBC Count: 9 10*3/uL (ref 4.0–10.5)
nRBC: 0 % (ref 0.0–0.2)

## 2020-07-21 LAB — CMP (CANCER CENTER ONLY)
ALT: 16 U/L (ref 0–44)
AST: 24 U/L (ref 15–41)
Albumin: 3.7 g/dL (ref 3.5–5.0)
Alkaline Phosphatase: 60 U/L (ref 38–126)
Anion gap: 13 (ref 5–15)
BUN: 10 mg/dL (ref 6–20)
CO2: 22 mmol/L (ref 22–32)
Calcium: 9.4 mg/dL (ref 8.9–10.3)
Chloride: 99 mmol/L (ref 98–111)
Creatinine: 1.11 mg/dL (ref 0.61–1.24)
GFR, Estimated: >60 mL/min (ref >60)
Glucose, Bld: 104 mg/dL — ABNORMAL HIGH (ref 70–99)
Potassium: 3.7 mmol/L (ref 3.5–5.1)
Sodium: 134 mmol/L — ABNORMAL LOW (ref 135–145)
Total Bilirubin: 0.6 mg/dL (ref 0.3–1.2)
Total Protein: 7.4 g/dL (ref 6.5–8.1)

## 2020-07-21 LAB — TSH: TSH: 0.575 u[IU]/mL (ref 0.320–4.118)

## 2020-07-21 LAB — TOTAL PROTEIN, URINE DIPSTICK: Protein, ur: NEGATIVE mg/dL

## 2020-07-21 MED ORDER — SODIUM CHLORIDE 0.9 % IV SOLN
15.0000 mg/kg | Freq: Once | INTRAVENOUS | Status: AC
  Administered 2020-07-21: 1200 mg via INTRAVENOUS
  Filled 2020-07-21: qty 48

## 2020-07-21 MED ORDER — SODIUM CHLORIDE 0.9% FLUSH
10.0000 mL | Freq: Once | INTRAVENOUS | Status: AC
Start: 2020-07-21 — End: 2020-07-21
  Administered 2020-07-21: 10 mL
  Filled 2020-07-21: qty 10

## 2020-07-21 MED ORDER — SODIUM CHLORIDE 0.9 % IV SOLN
Freq: Once | INTRAVENOUS | Status: AC
  Filled 2020-07-21: qty 250

## 2020-07-21 MED ORDER — SODIUM CHLORIDE 0.9% FLUSH
10.0000 mL | INTRAVENOUS | Status: DC | PRN
  Filled 2020-07-21: qty 10

## 2020-07-21 MED ORDER — SODIUM CHLORIDE 0.9 % IV SOLN
200.0000 mg | Freq: Once | INTRAVENOUS | Status: AC
  Administered 2020-07-21: 200 mg via INTRAVENOUS
  Filled 2020-07-21: qty 8

## 2020-07-21 MED ORDER — HEPARIN SOD (PORK) LOCK FLUSH 100 UNIT/ML IV SOLN
500.0000 [IU] | Freq: Once | INTRAVENOUS | Status: DC | PRN
  Filled 2020-07-21: qty 5

## 2020-07-21 NOTE — Progress Notes (Signed)
Order placed to access port the day of brain MRI at [facility]. Expected 5/19 or 5/20-22.    Mont Dutton R.T.(R)(T) Radiation Special Procedures Navigator

## 2020-07-21 NOTE — Progress Notes (Signed)
Per Dr Julien Nordmann it is okay to treat pt with Keytruda and avastin and heart rate of 106

## 2020-07-21 NOTE — Patient Instructions (Signed)
[facility] Discharge Instructions for Patients Receiving Chemotherapy  Today you received the following chemotherapy agents bevacizumab, pembrolizumab  To help prevent nausea and vomiting after your treatment, we encourage you to take your nausea medication as directed.   If you develop nausea and vomiting that is not controlled by your nausea medication, call the clinic.   BELOW ARE SYMPTOMS THAT SHOULD BE REPORTED IMMEDIATELY:  *FEVER GREATER THAN 100.5 F  *CHILLS WITH OR WITHOUT FEVER  NAUSEA AND VOMITING THAT IS NOT CONTROLLED WITH YOUR NAUSEA MEDICATION  *UNUSUAL SHORTNESS OF BREATH  *UNUSUAL BRUISING OR BLEEDING  TENDERNESS IN MOUTH AND THROAT WITH OR WITHOUT PRESENCE OF ULCERS  *URINARY PROBLEMS  *BOWEL PROBLEMS  UNUSUAL RASH Items with * indicate a potential emergency and should be followed up as soon as possible.  Feel free to call the clinic should you have any questions or concerns. The clinic phone number is [phone].  Please show the [facility] at check-in to the Emergency Department and triage nurse.

## 2020-07-21 NOTE — Progress Notes (Signed)
[place] [phone]   [phone]  OFFICE PROGRESS NOTE  [name], NP [address] [name] [address] [place] [address]  DIAGNOSIS: Stage IV (T3, N0, M1C) non-small cell lung cancer, adenocarcinoma.The patient presented with a right lower lobe/infrahilar mass as well as a solitary brain metastasis in the left cerebellum. He was diagnosed in July 2021.  Molecular Biomarkers:  MSI-High DETECTED Pembrolizumab Atezolizumab, Avelumab, Cemiplimab, Dostarlimab, Durvalumab, Ipilimumab, Nivolumab  STK11Splice Site SNV 2.9% Everolimus, Temsirolimus Yes  [id number] 1.7% Binimetinib Yes  [id number] 0.4%  Niraparib, Olaparib, Rucaparib, Talazoparib, Tazemetostat Yes  PRIOR THERAPY:  1) SRS to the solitary brain metastasis under the care of Dr. Lisbeth Renshaw. Last treatment 11/14/19. 2) Weekly concurrent chemoradiation with carboplatin for an AUC of 2, paclitaxel 45 mg/m2.First dose expected on 11/25/2019. Status post 7 cycles, last dose was giving 01/06/2020 with partial response.   CURRENT THERAPY:  1)  Immunotherapy with Keytruda 200 mg IV every 3 weeks.  First dose February 10, 2020 for a patient with MSI high.  Status post 6 cycles. 2) Avastin 15 mg/KG every 3 weeks.  First dose today for the vasogenic edema of the brain.S/P 3 cycles.  INTERVAL HISTORY: Joseph Atkins 60 y.o. male returns to the clinic today for follow-up visit accompanied by his wife.  The patient is feeling fine today with no concerning complaints except for the generalized fatigue especially in the lower extremities.  He continues to lose weight.  He denied having any chest pain, shortness of breath, cough or hemoptysis.  He denied having any fever or chills.  He has no nausea, vomiting, diarrhea or constipation.  He has no headache or visual changes.  He has been tolerating his treatment with Keytruda fairly well.  The patient is here today for evaluation  before starting cycle #7 of his treatment.  MEDICAL HISTORY: Past Medical History:  Diagnosis Date  . Cancer ([facility])    lung cancer  . Diverticulosis   . GERD (gastroesophageal reflux disease)   . Hx of small bowel obstruction   . Hyperlipidemia   . Hypertension   . Hypothyroidism   . IBS (irritable bowel syndrome)   . Substance abuse ([facility])    Alcoholic, Drug addition  . Thyroid disease     ALLERGIES:  is allergic to penicillins.  MEDICATIONS:  Current Outpatient Medications  Medication Sig Dispense Refill  . albuterol (PROVENTIL HFA;VENTOLIN HFA) 108 (90 Base) MCG/ACT inhaler Inhale 2 puffs into the lungs every 6 (six) hours as needed for wheezing or shortness of breath. 1 Inhaler 0  . ANORO ELLIPTA 62.5-25 MCG/INH AEPB 1 puff daily.    . cyclobenzaprine (FLEXERIL) 10 MG tablet Take 10 mg by mouth 2 (two) times daily as needed for muscle spasms. (Patient not taking: No sig reported)    . dexamethasone (DECADRON) 2 MG tablet Take 2 tablets (4 mg total) by mouth 2 (two) times daily. (Patient taking differently: Take 2 mg by mouth 2 (two) times daily.) 120 tablet 2  . docusate sodium (COLACE) 100 MG capsule Take 100 mg by mouth daily.    . DULoxetine (CYMBALTA) 20 MG capsule Take 1 capsule (20 mg total) by mouth 2 (two) times daily. 60 capsule 5  . fluticasone furoate-vilanterol (BREO ELLIPTA) 100-25 MCG/INH AEPB Inhale 1 puff into the lungs daily. 30 each 3  . levothyroxine (SYNTHROID) 75 MCG tablet Take 75 mcg by mouth daily.    Marland Kitchen lidocaine (XYLOCAINE) 2 % solution  Use as directed 15 mLs in the mouth or throat every 6 (six) hours as needed for mouth pain. Do not eat/drink within 60 minutes of taking this medication (Patient not taking: No sig reported) 100 mL 0  . lidocaine-prilocaine (EMLA) cream Apply 1 application topically as needed. 30 g 0  . omeprazole (PRILOSEC) 40 MG capsule Take 1 capsule (40 mg total) by mouth daily. 90 capsule 4  . ondansetron (ZOFRAN) 4 MG tablet Take 1  tablet (4 mg total) by mouth every 8 (eight) hours as needed. 40 tablet 2  . oxyCODONE-acetaminophen (PERCOCET/ROXICET) 5-325 MG tablet Take 1 tablet by mouth every 4 (four) hours as needed for severe pain. (Patient not taking: No sig reported) 30 tablet 0  . polyethylene glycol powder (MIRALAX) powder Take 17 g by mouth daily. 255 g 11  . prochlorperazine (COMPAZINE) 10 MG tablet Take 1 tablet (10 mg total) by mouth every 6 (six) hours as needed. (Patient not taking: No sig reported) 30 tablet 2  . sucralfate (CARAFATE) 1 g tablet Take 1 tablet (1 g total) by mouth 4 (four) times daily. Dissolve each tablet in 15 cc water before use. (Patient not taking: No sig reported) 120 tablet 2  . TRULANCE 3 MG TABS Take 1 tablet by mouth daily.    Marland Kitchen zolpidem (AMBIEN CR) 12.5 MG CR tablet Take 1 tablet (12.5 mg total) by mouth at bedtime as needed for sleep. (Patient not taking: No sig reported) 30 tablet 5   No current facility-administered medications for this visit.    SURGICAL HISTORY:  Past Surgical History:  Procedure Laterality Date  . arm surgery Right   . BRONCHIAL BRUSHINGS  10/24/2019   Procedure: BRONCHIAL BRUSHINGS;  Surgeon: Leslye Peer, MD;  Location: [facility] ENDOSCOPY;  Service: Cardiopulmonary;;  right lower lobe   . BRONCHIAL BRUSHINGS  11/05/2019   Procedure: BRONCHIAL BRUSHINGS;  Surgeon: Leslye Peer, MD;  Location: [facility] ENDOSCOPY;  Service: Pulmonary;;  . BRONCHIAL NEEDLE ASPIRATION BIOPSY  10/24/2019   Procedure: BRONCHIAL NEEDLE ASPIRATION BIOPSIES;  Surgeon: Leslye Peer, MD;  Location: [facility] ENDOSCOPY;  Service: Cardiopulmonary;;  . BRONCHIAL NEEDLE ASPIRATION BIOPSY  11/05/2019   Procedure: BRONCHIAL NEEDLE ASPIRATION BIOPSIES;  Surgeon: Leslye Peer, MD;  Location: [facility] ENDOSCOPY;  Service: Pulmonary;;  . ENDOBRONCHIAL ULTRASOUND N/A 10/24/2019   Procedure: ENDOBRONCHIAL ULTRASOUND;  Surgeon: Leslye Peer, MD;  Location: [facility] ENDOSCOPY;  Service: Cardiopulmonary;  Laterality:  N/A;  . FINGER SURGERY Right    Middle  . IR IMAGING GUIDED PORT INSERTION  11/19/2019  . VIDEO BRONCHOSCOPY N/A 10/24/2019   Procedure: VIDEO BRONCHOSCOPY WITHOUT FLUORO;  Surgeon: Leslye Peer, MD;  Location: [facility] ENDOSCOPY;  Service: Cardiopulmonary;  Laterality: N/A;  . VIDEO BRONCHOSCOPY WITH ENDOBRONCHIAL NAVIGATION N/A 11/05/2019   Procedure: VIDEO BRONCHOSCOPY WITH ENDOBRONCHIAL NAVIGATION;  Surgeon: Leslye Peer, MD;  Location: [facility] ENDOSCOPY;  Service: Pulmonary;  Laterality: N/A;    REVIEW OF SYSTEMS:  A comprehensive review of systems was negative except for: Constitutional: positive for fatigue and weight loss Respiratory: positive for dyspnea on exertion   PHYSICAL EXAMINATION: General appearance: alert, cooperative, fatigued and no distress Head: Normocephalic, without obvious abnormality, atraumatic Neck: no adenopathy, no JVD, supple, symmetrical, trachea midline and thyroid not enlarged, symmetric, no tenderness/mass/nodules Lymph nodes: Cervical, supraclavicular, and axillary nodes normal. Resp: clear to auscultation bilaterally Back: symmetric, no curvature. ROM normal. No CVA tenderness. Cardio: regular rate and rhythm, S1, S2 normal, no murmur, click, rub or gallop GI:  soft, non-tender; bowel sounds normal; no masses,  no organomegaly Extremities: extremities normal, atraumatic, no cyanosis or edema  ECOG PERFORMANCE STATUS: 1 - Symptomatic but completely ambulatory  Blood pressure 103/82, pulse (!) 106, temperature 97.9 F (36.6 C), temperature source Tympanic, resp. rate 16, height $RemoveBe'5\' 8"'dAQViqchu$  (1.727 m), weight 169 lb 9.6 oz (76.9 kg), SpO2 100 %.  LABORATORY DATA: Lab Results  Component Value Date   WBC 9.0 07/21/2020   HGB 14.6 07/21/2020   HCT 44.9 07/21/2020   MCV 92.0 07/21/2020   PLT 223 07/21/2020      Chemistry      Component Value Date/Time   NA 137 06/30/2020 0844   NA 141 03/08/2017 1707   K 3.1 (L) 06/30/2020 0844   CL 104 06/30/2020 0844   CO2  19 (L) 06/30/2020 0844   BUN 10 06/30/2020 0844   BUN 12 03/08/2017 1707   CREATININE 1.56 (H) 06/30/2020 0844      Component Value Date/Time   CALCIUM 9.8 06/30/2020 0844   ALKPHOS 44 06/30/2020 0844   AST 24 06/30/2020 0844   ALT 16 06/30/2020 0844   BILITOT 0.4 06/30/2020 0844       RADIOGRAPHIC STUDIES: CT Chest W Contrast  Result Date: 06/30/2020 CLINICAL DATA:  Non-small cell lung cancer staging, ongoing chemotherapy and immunotherapy EXAM: CT CHEST WITH CONTRAST TECHNIQUE: Multidetector CT imaging of the chest was performed during intravenous contrast administration. CONTRAST:  41mL OMNIPAQUE IOHEXOL 300 MG/ML  SOLN COMPARISON:  05/15/2020 FINDINGS: Cardiovascular: Right chest port catheter. Scattered aortic atherosclerosis. Normal heart size. Left and right coronary artery calcifications. No pericardial effusion. Mediastinum/Nodes: No enlarged mediastinal, hilar, or axillary lymph nodes. Thyroid gland, trachea, and esophagus demonstrate no significant findings. Lungs/Pleura: No significant change in size of spiculated mass of the medial right lower lobe measuring 2.8 x 2.0 cm (series 5, image 108). Interval increase in irregular heterogeneous and ground-glass airspace opacity, architectural distortion, and bronchiectasis in this vicinity. Multiple small pulmonary nodules, new on prior examination, are diminished in size and solidity, an index cavitary nodule of the left lower lobe now measuring approximately 3 mm, previously 6 mm (series 5, image 109) and of the medial right upper lobe measuring 4 mm, previously 7 mm (series 5, image 43). Mild centrilobular emphysema. No pleural effusion or pneumothorax. Upper Abdomen: No acute abnormality. Musculoskeletal: No chest wall mass or suspicious bone lesions identified. IMPRESSION: 1. No significant change in size of spiculated mass of the medial right lower lobe measuring 2.8 x 2.0 cm. Interval increase in irregular heterogeneous and ground-glass  airspace opacity, architectural distortion, and bronchiectasis in this vicinity, consistent with developing radiation fibrosis. 2. Multiple small pulmonary nodules, new on prior examination, are diminished in size and solidity, an index cavitary nodule of the left lower lobe now measuring approximately 3 mm, previously 6 mm and of the medial right upper lobe measuring 4 mm, previously 7 mm. Findings are consistent with treatment response of small metastases or alternately resolving nonspecific infection or inflammation. 3. Emphysema. 4. Coronary artery disease. 5. Aortic atherosclerosis. Aortic Atherosclerosis (ICD10-I70.0) and Emphysema (ICD10-J43.9). Electronically Signed   By: Eddie Candle M.D.   On: 06/30/2020 09:16    ASSESSMENT AND PLAN: This is a very pleasant 60 years old white male with a stage IV (t3, N0, M1c) non-small cell lung cancer, adenocarcinoma with MSI high presented with right lower lobe/infrahilar mass in addition to solitary brain metastasis in the left cerebellum diagnosed in July 2021. He is status post [facility]  to the solitary brain metastasis. The patient completed a course of concurrent chemoradiation with weekly carboplatin and paclitaxel.  He tolerated the treatment well except for fatigue and mild odynophagia. He had repeat CT scan of the chest performed recently.  I personally and independently reviewed the scans and discussed the results with the patient and his wife. His scan showed interval decrease in the volume of the right infrahilar mass with no other evidence of metastatic disease. The patient has MSI high and I recommended for him treatment with immunotherapy with single agent Keytruda 200 mg IV every 3 weeks for a total of 2 years unless the patient has unacceptable toxicity or disease progression. He is status post 6 cycles of treatment with Keytruda.  He also received 6 cycles of Avastin for the vasogenic edema in the brain.   The patient continues to tolerate his  treatment well with no concerning adverse effects. I recommended for him to proceed with cycle #7 today as planned. I will see him back for follow-up visit in 3 weeks for evaluation before the next cycle of his treatment. For the metastatic brain lesions, he is followed by Dr. Mickeal Skinner and Dr. Lisbeth Renshaw.  We will continue with Avastin for the vasogenic edema at this point.  He is currently off Decadron. He was advised to call immediately if he has any concerning symptoms in the interval The patient voices understanding of current disease status and treatment options and is in agreement with the current care plan.  All questions were answered. The patient knows to call the clinic with any problems, questions or concerns. We can certainly see the patient much sooner if necessary.  Disclaimer: This note was dictated with voice recognition software. Similar sounding words can inadvertently be transcribed and may not be corrected upon review.

## 2020-08-11 ENCOUNTER — Inpatient Hospital Stay (HOSPITAL_BASED_OUTPATIENT_CLINIC_OR_DEPARTMENT_OTHER): Payer: Managed Care, Other (non HMO) | Admitting: Internal Medicine

## 2020-08-11 ENCOUNTER — Inpatient Hospital Stay: Payer: Managed Care, Other (non HMO)

## 2020-08-11 ENCOUNTER — Other Ambulatory Visit: Payer: Self-pay

## 2020-08-11 ENCOUNTER — Encounter: Payer: Self-pay | Admitting: Internal Medicine

## 2020-08-11 ENCOUNTER — Inpatient Hospital Stay: Payer: Managed Care, Other (non HMO) | Attending: Internal Medicine

## 2020-08-11 VITALS — BP 113/78 | HR 97 | Temp 97.8°F | Resp 17 | Ht 68.0 in | Wt 163.2 lb

## 2020-08-11 DIAGNOSIS — E039 Hypothyroidism, unspecified: Secondary | ICD-10-CM | POA: Diagnosis not present

## 2020-08-11 DIAGNOSIS — I10 Essential (primary) hypertension: Secondary | ICD-10-CM

## 2020-08-11 DIAGNOSIS — G936 Cerebral edema: Secondary | ICD-10-CM | POA: Insufficient documentation

## 2020-08-11 DIAGNOSIS — C7931 Secondary malignant neoplasm of brain: Secondary | ICD-10-CM | POA: Diagnosis not present

## 2020-08-11 DIAGNOSIS — Z95828 Presence of other vascular implants and grafts: Secondary | ICD-10-CM

## 2020-08-11 DIAGNOSIS — Z5112 Encounter for antineoplastic immunotherapy: Secondary | ICD-10-CM | POA: Diagnosis not present

## 2020-08-11 DIAGNOSIS — K219 Gastro-esophageal reflux disease without esophagitis: Secondary | ICD-10-CM | POA: Diagnosis not present

## 2020-08-11 DIAGNOSIS — Z79899 Other long term (current) drug therapy: Secondary | ICD-10-CM | POA: Diagnosis not present

## 2020-08-11 DIAGNOSIS — C3491 Malignant neoplasm of unspecified part of right bronchus or lung: Secondary | ICD-10-CM

## 2020-08-11 DIAGNOSIS — E785 Hyperlipidemia, unspecified: Secondary | ICD-10-CM | POA: Insufficient documentation

## 2020-08-11 DIAGNOSIS — C3431 Malignant neoplasm of lower lobe, right bronchus or lung: Secondary | ICD-10-CM | POA: Diagnosis not present

## 2020-08-11 DIAGNOSIS — Z923 Personal history of irradiation: Secondary | ICD-10-CM | POA: Insufficient documentation

## 2020-08-11 DIAGNOSIS — K589 Irritable bowel syndrome without diarrhea: Secondary | ICD-10-CM | POA: Diagnosis not present

## 2020-08-11 LAB — CMP (CANCER CENTER ONLY)
ALT: 10 U/L (ref 0–44)
AST: 22 U/L (ref 15–41)
Albumin: 3.4 g/dL — ABNORMAL LOW (ref 3.5–5.0)
Alkaline Phosphatase: 66 U/L (ref 38–126)
Anion gap: 13 (ref 5–15)
BUN: <4 mg/dL — ABNORMAL LOW (ref 6–20)
CO2: 25 mmol/L (ref 22–32)
Calcium: 8.9 mg/dL (ref 8.9–10.3)
Chloride: 97 mmol/L — ABNORMAL LOW (ref 98–111)
Creatinine: 0.94 mg/dL (ref 0.61–1.24)
GFR, Estimated: >60 mL/min (ref >60)
Glucose, Bld: 111 mg/dL — ABNORMAL HIGH (ref 70–99)
Potassium: 3.3 mmol/L — ABNORMAL LOW (ref 3.5–5.1)
Sodium: 135 mmol/L (ref 135–145)
Total Bilirubin: 0.6 mg/dL (ref 0.3–1.2)
Total Protein: 6.8 g/dL (ref 6.5–8.1)

## 2020-08-11 LAB — CBC WITH DIFFERENTIAL (CANCER CENTER ONLY)
Abs Immature Granulocytes: 0.04 10*3/uL (ref 0.00–0.07)
Basophils Absolute: 0.1 10*3/uL (ref 0.0–0.1)
Basophils Relative: 1 %
Eosinophils Absolute: 0.2 10*3/uL (ref 0.0–0.5)
Eosinophils Relative: 2 %
HCT: 45.8 % (ref 39.0–52.0)
Hemoglobin: 14.9 g/dL (ref 13.0–17.0)
Immature Granulocytes: 1 %
Lymphocytes Relative: 17 %
Lymphs Abs: 1.5 10*3/uL (ref 0.7–4.0)
MCH: 29.4 pg (ref 26.0–34.0)
MCHC: 32.5 g/dL (ref 30.0–36.0)
MCV: 90.5 fL (ref 80.0–100.0)
Monocytes Absolute: 0.9 10*3/uL (ref 0.1–1.0)
Monocytes Relative: 11 %
Neutro Abs: 6 10*3/uL (ref 1.7–7.7)
Neutrophils Relative %: 68 %
Platelet Count: 244 10*3/uL (ref 150–400)
RBC: 5.06 MIL/uL (ref 4.22–5.81)
RDW: 13.4 % (ref 11.5–15.5)
WBC Count: 8.8 10*3/uL (ref 4.0–10.5)
nRBC: 0 % (ref 0.0–0.2)

## 2020-08-11 LAB — TSH: TSH: 12.018 u[IU]/mL — ABNORMAL HIGH (ref 0.320–4.118)

## 2020-08-11 MED ORDER — SODIUM CHLORIDE 0.9% FLUSH
10.0000 mL | Freq: Once | INTRAVENOUS | Status: AC
  Administered 2020-08-11: 10 mL
  Filled 2020-08-11: qty 10

## 2020-08-11 MED ORDER — SODIUM CHLORIDE 0.9 % IV SOLN
Freq: Once | INTRAVENOUS | Status: AC
  Filled 2020-08-11: qty 250

## 2020-08-11 MED ORDER — LEVOTHYROXINE SODIUM 100 MCG PO TABS: 100.0000 ug | ORAL_TABLET | Freq: Every day | ORAL | 3 refills | Status: DC

## 2020-08-11 MED ORDER — SODIUM CHLORIDE 0.9 % IV SOLN
200.0000 mg | Freq: Once | INTRAVENOUS | Status: AC
  Administered 2020-08-11: 200 mg via INTRAVENOUS
  Filled 2020-08-11: qty 8

## 2020-08-11 MED ORDER — SODIUM CHLORIDE 0.9% FLUSH
10.0000 mL | INTRAVENOUS | Status: DC | PRN
  Administered 2020-08-11: 10 mL
  Filled 2020-08-11: qty 10

## 2020-08-11 MED ORDER — HEPARIN SOD (PORK) LOCK FLUSH 100 UNIT/ML IV SOLN
500.0000 [IU] | Freq: Once | INTRAVENOUS | Status: AC | PRN
Start: 2020-08-11 — End: 2020-08-11
  Administered 2020-08-11: 500 [IU]
  Filled 2020-08-11: qty 5

## 2020-08-11 NOTE — Progress Notes (Signed)
Noah Charon being held today per Dr. Curt Bears.

## 2020-08-11 NOTE — Patient Instructions (Signed)
[facility] Discharge Instructions for Patients Receiving Chemotherapy  Today you received the following chemotherapy agents:  Keytruda.  To help prevent nausea and vomiting after your treatment, we encourage you to take your nausea medication as directed.   If you develop nausea and vomiting that is not controlled by your nausea medication, call the clinic.   BELOW ARE SYMPTOMS THAT SHOULD BE REPORTED IMMEDIATELY:  *FEVER GREATER THAN 100.5 F  *CHILLS WITH OR WITHOUT FEVER  NAUSEA AND VOMITING THAT IS NOT CONTROLLED WITH YOUR NAUSEA MEDICATION  *UNUSUAL SHORTNESS OF BREATH  *UNUSUAL BRUISING OR BLEEDING  TENDERNESS IN MOUTH AND THROAT WITH OR WITHOUT PRESENCE OF ULCERS  *URINARY PROBLEMS  *BOWEL PROBLEMS  UNUSUAL RASH Items with * indicate a potential emergency and should be followed up as soon as possible.  Feel free to call the clinic should you have any questions or concerns. The clinic phone number is [phone].  Please show the CHEMO ALERT CARD at check-in to the Emergency Department and triage nurse.    

## 2020-08-11 NOTE — Patient Instructions (Signed)
Steps to Quit Smoking Smoking tobacco is the leading cause of preventable death. It can affect almost every organ in the body. Smoking puts you and people around you at risk for many serious, long-lasting (chronic) diseases. Quitting smoking can be hard, but it is one of the best things that you can do for your health. It is never too late to quit. How do I get ready to quit? When you decide to quit smoking, make a plan to help you succeed. Before you quit:  Pick a date to quit. Set a date within the next 2 weeks to give you time to prepare.  Write down the reasons why you are quitting. Keep this list in places where you will see it often.  Tell your family, friends, and co-workers that you are quitting. Their support is important.  Talk with your doctor about the choices that may help you quit.  Find out if your health insurance will pay for these treatments.  Know the people, places, things, and activities that make you want to smoke (triggers). Avoid them. What first steps can I take to quit smoking?  Throw away all cigarettes at home, at work, and in your car.  Throw away the things that you use when you smoke, such as ashtrays and lighters.  Clean your car. Make sure to empty the ashtray.  Clean your home, including curtains and carpets. What can I do to help me quit smoking? Talk with your doctor about taking medicines and seeing a counselor at the same time. You are more likely to succeed when you do both.  If you are pregnant or breastfeeding, talk with your doctor about counseling or other ways to quit smoking. Do not take medicine to help you quit smoking unless your doctor tells you to do so. To quit smoking: Quit right away  Quit smoking totally, instead of slowly cutting back on how much you smoke over a period of time.  Go to counseling. You are more likely to quit if you go to counseling sessions regularly. Take medicine You may take medicines to help you quit. Some  medicines need a prescription, and some you can buy over-the-counter. Some medicines may contain a drug called nicotine to replace the nicotine in cigarettes. Medicines may:  Help you to stop having the desire to smoke (cravings).  Help to stop the problems that come when you stop smoking (withdrawal symptoms). Your doctor may ask you to use:  Nicotine patches, gum, or lozenges.  Nicotine inhalers or sprays.  Non-nicotine medicine that is taken by mouth. Find resources Find resources and other ways to help you quit smoking and remain smoke-free after you quit. These resources are most helpful when you use them often. They include:  Online chats with a counselor.  Phone quitlines.  Printed self-help materials.  Support groups or group counseling.  Text messaging programs.  Mobile phone apps. Use apps on your mobile phone or tablet that can help you stick to your quit plan. There are many free apps for mobile phones and tablets as well as websites. Examples include Quit Guide from the CDC and smokefree.gov   What things can I do to make it easier to quit?  Talk to your family and friends. Ask them to support and encourage you.  Call a phone quitline (1-800-QUIT-NOW), reach out to support groups, or work with a counselor.  Ask people who smoke to not smoke around you.  Avoid places that make you want to smoke,   such as: ? Bars. ? Parties. ? Smoke-break areas at work.  Spend time with people who do not smoke.  Lower the stress in your life. Stress can make you want to smoke. Try these things to help your stress: ? Getting regular exercise. ? Doing deep-breathing exercises. ? Doing yoga. ? Meditating. ? Doing a body scan. To do this, close your eyes, focus on one area of your body at a time from head to toe. Notice which parts of your body are tense. Try to relax the muscles in those areas.   How will I feel when I quit smoking? Day 1 to 3 weeks Within the first 24 hours,  you may start to have some problems that come from quitting tobacco. These problems are very bad 2-3 days after you quit, but they do not often last for more than 2-3 weeks. You may get these symptoms:  Mood swings.  Feeling restless, nervous, angry, or annoyed.  Trouble concentrating.  Dizziness.  Strong desire for high-sugar foods and nicotine.  Weight gain.  Trouble pooping (constipation).  Feeling like you may vomit (nausea).  Coughing or a sore throat.  Changes in how the medicines that you take for other issues work in your body.  Depression.  Trouble sleeping (insomnia). Week 3 and afterward After the first 2-3 weeks of quitting, you may start to notice more positive results, such as:  Better sense of smell and taste.  Less coughing and sore throat.  Slower heart rate.  Lower blood pressure.  Clearer skin.  Better breathing.  Fewer sick days. Quitting smoking can be hard. Do not give up if you fail the first time. Some people need to try a few times before they succeed. Do your best to stick to your quit plan, and talk with your doctor if you have any questions or concerns. Summary  Smoking tobacco is the leading cause of preventable death. Quitting smoking can be hard, but it is one of the best things that you can do for your health.  When you decide to quit smoking, make a plan to help you succeed.  Quit smoking right away, not slowly over a period of time.  When you start quitting, seek help from your doctor, family, or friends. This information is not intended to replace advice given to you by your health care provider. Make sure you discuss any questions you have with your health care provider. Document Revised: 01/04/2019 Document Reviewed: 06/30/2018 [employer] Patient Education  2021 [employer]  

## 2020-08-11 NOTE — Progress Notes (Signed)
[place] [phone]   [phone]  OFFICE PROGRESS NOTE  [name], NP [address] [name] [address] [place] [address]  DIAGNOSIS: Stage IV (T3, N0, M1C) non-small cell lung cancer, adenocarcinoma.The patient presented with a right lower lobe/infrahilar mass as well as a solitary brain metastasis in the left cerebellum. He was diagnosed in July 2021.  Molecular Biomarkers:  MSI-High DETECTED Pembrolizumab Atezolizumab, Avelumab, Cemiplimab, Dostarlimab, Durvalumab, Ipilimumab, Nivolumab  STK11Splice Site SNV 3.7% Everolimus, Temsirolimus Yes  [id number] 1.7% Binimetinib Yes  [id number] 0.4%  Niraparib, Olaparib, Rucaparib, Talazoparib, Tazemetostat Yes  PRIOR THERAPY:  1) SRS to the solitary brain metastasis under the care of Dr. Lisbeth Renshaw. Last treatment 11/14/19. 2) Weekly concurrent chemoradiation with carboplatin for an AUC of 2, paclitaxel 45 mg/m2.First dose expected on 11/25/2019. Status post 7 cycles, last dose was giving 01/06/2020 with partial response.   CURRENT THERAPY:  1)  Immunotherapy with Keytruda 200 mg IV every 3 weeks.  First dose February 10, 2020 for a patient with MSI high.  Status post 7 cycles. 2) Avastin 15 mg/KG every 3 weeks.  First dose today for the vasogenic edema of the brain.S/P 3 cycles.  INTERVAL HISTORY: Joseph Atkins 60 y.o. male returns to the clinic today for follow-up visit accompanied by his wife.  The patient is feeling fine today with no concerning complaints except for itching all over his body.  He has used Cetaphil with minimal improvement.  He denied having any current chest pain, shortness of breath, cough or hemoptysis.  He denied having any fever or chills.  He has no nausea, vomiting, diarrhea or constipation.  He lost few pounds since his last visit and he eats only 1 time a day.  He denied having any headache or visual changes.  He is scheduled to have repeat MRI  of the brain next month.  The patient is here today for evaluation before starting cycle #8 of his treatment.  MEDICAL HISTORY: Past Medical History:  Diagnosis Date  . Cancer ([facility])    lung cancer  . Diverticulosis   . GERD (gastroesophageal reflux disease)   . Hx of small bowel obstruction   . Hyperlipidemia   . Hypertension   . Hypothyroidism   . IBS (irritable bowel syndrome)   . Substance abuse ([facility])    Alcoholic, Drug addition  . Thyroid disease     ALLERGIES:  is allergic to penicillins.  MEDICATIONS:  Current Outpatient Medications  Medication Sig Dispense Refill  . albuterol (PROVENTIL HFA;VENTOLIN HFA) 108 (90 Base) MCG/ACT inhaler Inhale 2 puffs into the lungs every 6 (six) hours as needed for wheezing or shortness of breath. 1 Inhaler 0  . ANORO ELLIPTA 62.5-25 MCG/INH AEPB 1 puff daily.    . cyclobenzaprine (FLEXERIL) 10 MG tablet Take 10 mg by mouth 2 (two) times daily as needed for muscle spasms. (Patient not taking: No sig reported)    . dexamethasone (DECADRON) 2 MG tablet Take 2 tablets (4 mg total) by mouth 2 (two) times daily. (Patient taking differently: Take 2 mg by mouth 2 (two) times daily.) 120 tablet 2  . docusate sodium (COLACE) 100 MG capsule Take 100 mg by mouth daily.    . DULoxetine (CYMBALTA) 20 MG capsule Take 1 capsule (20 mg total) by mouth 2 (two) times daily. 60 capsule 5  . fluticasone furoate-vilanterol (BREO ELLIPTA) 100-25 MCG/INH AEPB Inhale 1 puff into the lungs daily. 30 each 3  .  ibuprofen (ADVIL) 800 MG tablet Take 800 mg by mouth 3 (three) times daily.    Marland Kitchen levothyroxine (SYNTHROID) 75 MCG tablet Take 75 mcg by mouth daily.    Marland Kitchen lidocaine (XYLOCAINE) 2 % solution Use as directed 15 mLs in the mouth or throat every 6 (six) hours as needed for mouth pain. Do not eat/drink within 60 minutes of taking this medication (Patient not taking: No sig reported) 100 mL 0  . lidocaine-prilocaine (EMLA) cream Apply 1 application topically as needed.  30 g 0  . omeprazole (PRILOSEC) 40 MG capsule Take 1 capsule (40 mg total) by mouth daily. 90 capsule 4  . ondansetron (ZOFRAN) 4 MG tablet Take 1 tablet (4 mg total) by mouth every 8 (eight) hours as needed. 40 tablet 2  . oxyCODONE-acetaminophen (PERCOCET/ROXICET) 5-325 MG tablet Take 1 tablet by mouth every 4 (four) hours as needed for severe pain. (Patient not taking: No sig reported) 30 tablet 0  . polyethylene glycol powder (MIRALAX) powder Take 17 g by mouth daily. 255 g 11  . prochlorperazine (COMPAZINE) 10 MG tablet Take 1 tablet (10 mg total) by mouth every 6 (six) hours as needed. (Patient not taking: No sig reported) 30 tablet 2  . sucralfate (CARAFATE) 1 g tablet Take 1 tablet (1 g total) by mouth 4 (four) times daily. Dissolve each tablet in 15 cc water before use. (Patient not taking: No sig reported) 120 tablet 2  . TRULANCE 3 MG TABS Take 1 tablet by mouth daily.    Marland Kitchen zolpidem (AMBIEN CR) 12.5 MG CR tablet Take 1 tablet (12.5 mg total) by mouth at bedtime as needed for sleep. (Patient not taking: No sig reported) 30 tablet 5   No current facility-administered medications for this visit.    SURGICAL HISTORY:  Past Surgical History:  Procedure Laterality Date  . arm surgery Right   . BRONCHIAL BRUSHINGS  10/24/2019   Procedure: BRONCHIAL BRUSHINGS;  Surgeon: Collene Gobble, MD;  Location[facility] ENDOSCOPY;  Service: Cardiopulmonary;;  right lower lobe   . BRONCHIAL BRUSHINGS  11/05/2019   Procedure: BRONCHIAL BRUSHINGS;  Surgeon: Collene Gobble, MD;  Location: [facility] ENDOSCOPY;  Service: Pulmonary;;  . BRONCHIAL NEEDLE ASPIRATION BIOPSY  10/24/2019   Procedure: BRONCHIAL NEEDLE ASPIRATION BIOPSIES;  Surgeon: Collene Gobble, MD;  Location: [place];  Service: Cardiopulmonary;;  . BRONCHIAL NEEDLE ASPIRATION BIOPSY  11/05/2019   Procedure: BRONCHIAL NEEDLE ASPIRATION BIOPSIES;  Surgeon: Collene Gobble, MD;  Location: [facility] ENDOSCOPY;  Service: Pulmonary;;  . ENDOBRONCHIAL ULTRASOUND N/A  10/24/2019   Procedure: ENDOBRONCHIAL ULTRASOUND;  Surgeon: Collene Gobble, MD;  Location: [place];  Service: Cardiopulmonary;  Laterality: N/A;  . FINGER SURGERY Right    Middle  . IR IMAGING GUIDED PORT INSERTION  11/19/2019  . VIDEO BRONCHOSCOPY N/A 10/24/2019   Procedure: VIDEO BRONCHOSCOPY WITHOUT FLUORO;  Surgeon: Collene Gobble, MD;  Location: [facility] ENDOSCOPY;  Service: Cardiopulmonary;  Laterality: N/A;  . VIDEO BRONCHOSCOPY WITH ENDOBRONCHIAL NAVIGATION N/A 11/05/2019   Procedure: VIDEO BRONCHOSCOPY WITH ENDOBRONCHIAL NAVIGATION;  Surgeon: Collene Gobble, MD;  Location: [place] ENDOSCOPY;  Service: Pulmonary;  Laterality: N/A;    REVIEW OF SYSTEMS:  A comprehensive review of systems was negative except for: Constitutional: positive for fatigue and weight loss Integument/breast: positive for pruritus   PHYSICAL EXAMINATION: General appearance: alert, cooperative, fatigued and no distress Head: Normocephalic, without obvious abnormality, atraumatic Neck: no adenopathy, no JVD, supple, symmetrical, trachea midline and thyroid not enlarged, symmetric, no tenderness/mass/nodules Lymph  nodes: Cervical, supraclavicular, and axillary nodes normal. Resp: clear to auscultation bilaterally Back: symmetric, no curvature. ROM normal. No CVA tenderness. Cardio: regular rate and rhythm, S1, S2 normal, no murmur, click, rub or gallop GI: soft, non-tender; bowel sounds normal; no masses,  no organomegaly Extremities: extremities normal, atraumatic, no cyanosis or edema  ECOG PERFORMANCE STATUS: 1 - Symptomatic but completely ambulatory  Blood pressure 113/78, pulse 97, temperature 97.8 F (36.6 C), temperature source Tympanic, resp. rate 17, height $RemoveBe'5\' 8"'[id number]$  (1.727 m), weight 163 lb 3.2 oz (74 kg), SpO2 99 %.  LABORATORY DATA: Lab Results  Component Value Date   WBC 8.8 08/11/2020   HGB 14.9 08/11/2020   HCT 45.8 08/11/2020   MCV 90.5 08/11/2020   PLT 244 08/11/2020      Chemistry       Component Value Date/Time   NA 134 (L) 07/21/2020 0929   NA 141 03/08/2017 1707   K 3.7 07/21/2020 0929   CL 99 07/21/2020 0929   CO2 22 07/21/2020 0929   BUN 10 07/21/2020 0929   BUN 12 03/08/2017 1707   CREATININE 1.11 07/21/2020 0929      Component Value Date/Time   CALCIUM 9.4 07/21/2020 0929   ALKPHOS 60 07/21/2020 0929   AST 24 07/21/2020 0929   ALT 16 07/21/2020 0929   BILITOT 0.6 07/21/2020 0929       RADIOGRAPHIC STUDIES: No results found.  ASSESSMENT AND PLAN: This is a very pleasant 60 years old white male with a stage IV (t3, N0, M1c) non-small cell lung cancer, adenocarcinoma with MSI high presented with right lower lobe/infrahilar mass in addition to solitary brain metastasis in the left cerebellum diagnosed in July 2021. He is status post SRS to the solitary brain metastasis. The patient completed a course of concurrent chemoradiation with weekly carboplatin and paclitaxel.  He tolerated the treatment well except for fatigue and mild odynophagia. He had repeat CT scan of the chest performed recently.  I personally and independently reviewed the scans and discussed the results with the patient and his wife. His scan showed interval decrease in the volume of the right infrahilar mass with no other evidence of metastatic disease. The patient has MSI high and I recommended for him treatment with immunotherapy with single agent Keytruda 200 mg IV every 3 weeks for a total of 2 years unless the patient has unacceptable toxicity or disease progression. He is status post 7 cycles of treatment with Keytruda.  He also received 7 cycles of Avastin for the vasogenic edema in the brain.   The patient has been tolerating this treatment well with no concerning adverse effects. I recommended for him to proceed with cycle #8 today as planned. He will come back for follow-up visit in 3 weeks for evaluation before the next cycle of his treatment. For the metastatic brain lesions, he is  followed by Dr. Mickeal Skinner and Dr. Lisbeth Renshaw.  He is scheduled to have MRI of the brain next month. The patient was advised to call immediately if he has any concerning symptoms in the interval. The patient voices understanding of current disease status and treatment options and is in agreement with the current care plan.  All questions were answered. The patient knows to call the clinic with any problems, questions or concerns. We can certainly see the patient much sooner if necessary.  Disclaimer: This note was dictated with voice recognition software. Similar sounding words can inadvertently be transcribed and may not be corrected upon review.

## 2020-08-17 ENCOUNTER — Other Ambulatory Visit: Payer: Self-pay | Admitting: Radiation Oncology

## 2020-08-19 ENCOUNTER — Telehealth: Payer: Self-pay | Admitting: Internal Medicine

## 2020-08-19 NOTE — Telephone Encounter (Signed)
R/s 5/10 lab and MD appt per provider PAL. Called and spoke with patient. Confirmed decoupled appts for 5/9 and 5/10

## 2020-08-31 ENCOUNTER — Encounter: Payer: Self-pay | Admitting: Internal Medicine

## 2020-08-31 ENCOUNTER — Inpatient Hospital Stay (HOSPITAL_BASED_OUTPATIENT_CLINIC_OR_DEPARTMENT_OTHER): Payer: Managed Care, Other (non HMO) | Admitting: Internal Medicine

## 2020-08-31 ENCOUNTER — Other Ambulatory Visit: Payer: Self-pay

## 2020-08-31 ENCOUNTER — Inpatient Hospital Stay: Payer: Managed Care, Other (non HMO) | Attending: Internal Medicine

## 2020-08-31 ENCOUNTER — Other Ambulatory Visit: Payer: Self-pay | Admitting: Internal Medicine

## 2020-08-31 ENCOUNTER — Other Ambulatory Visit: Payer: Self-pay | Admitting: Medical Oncology

## 2020-08-31 VITALS — BP 145/102 | HR 91 | Temp 97.4°F | Resp 18 | Wt 163.9 lb

## 2020-08-31 DIAGNOSIS — G936 Cerebral edema: Secondary | ICD-10-CM | POA: Insufficient documentation

## 2020-08-31 DIAGNOSIS — Z95828 Presence of other vascular implants and grafts: Secondary | ICD-10-CM

## 2020-08-31 DIAGNOSIS — E785 Hyperlipidemia, unspecified: Secondary | ICD-10-CM | POA: Diagnosis not present

## 2020-08-31 DIAGNOSIS — K219 Gastro-esophageal reflux disease without esophagitis: Secondary | ICD-10-CM | POA: Insufficient documentation

## 2020-08-31 DIAGNOSIS — Z5112 Encounter for antineoplastic immunotherapy: Secondary | ICD-10-CM | POA: Insufficient documentation

## 2020-08-31 DIAGNOSIS — R630 Anorexia: Secondary | ICD-10-CM | POA: Insufficient documentation

## 2020-08-31 DIAGNOSIS — C3431 Malignant neoplasm of lower lobe, right bronchus or lung: Secondary | ICD-10-CM | POA: Insufficient documentation

## 2020-08-31 DIAGNOSIS — F1010 Alcohol abuse, uncomplicated: Secondary | ICD-10-CM | POA: Diagnosis not present

## 2020-08-31 DIAGNOSIS — Z79899 Other long term (current) drug therapy: Secondary | ICD-10-CM | POA: Insufficient documentation

## 2020-08-31 DIAGNOSIS — C7931 Secondary malignant neoplasm of brain: Secondary | ICD-10-CM | POA: Diagnosis not present

## 2020-08-31 DIAGNOSIS — I10 Essential (primary) hypertension: Secondary | ICD-10-CM | POA: Diagnosis not present

## 2020-08-31 DIAGNOSIS — R1310 Dysphagia, unspecified: Secondary | ICD-10-CM | POA: Insufficient documentation

## 2020-08-31 DIAGNOSIS — C3491 Malignant neoplasm of unspecified part of right bronchus or lung: Secondary | ICD-10-CM

## 2020-08-31 DIAGNOSIS — K589 Irritable bowel syndrome without diarrhea: Secondary | ICD-10-CM | POA: Diagnosis not present

## 2020-08-31 DIAGNOSIS — F5101 Primary insomnia: Secondary | ICD-10-CM

## 2020-08-31 DIAGNOSIS — E079 Disorder of thyroid, unspecified: Secondary | ICD-10-CM | POA: Insufficient documentation

## 2020-08-31 DIAGNOSIS — E039 Hypothyroidism, unspecified: Secondary | ICD-10-CM | POA: Diagnosis not present

## 2020-08-31 DIAGNOSIS — C3490 Malignant neoplasm of unspecified part of unspecified bronchus or lung: Secondary | ICD-10-CM

## 2020-08-31 LAB — CBC WITH DIFFERENTIAL (CANCER CENTER ONLY)
Abs Immature Granulocytes: 0.03 10*3/uL (ref 0.00–0.07)
Basophils Absolute: 0.1 10*3/uL (ref 0.0–0.1)
Basophils Relative: 1 %
Eosinophils Absolute: 0.2 10*3/uL (ref 0.0–0.5)
Eosinophils Relative: 2 %
HCT: 42.6 % (ref 39.0–52.0)
Hemoglobin: 13.9 g/dL (ref 13.0–17.0)
Immature Granulocytes: 0 %
Lymphocytes Relative: 21 %
Lymphs Abs: 1.6 10*3/uL (ref 0.7–4.0)
MCH: 29.1 pg (ref 26.0–34.0)
MCHC: 32.6 g/dL (ref 30.0–36.0)
MCV: 89.3 fL (ref 80.0–100.0)
Monocytes Absolute: 0.7 10*3/uL (ref 0.1–1.0)
Monocytes Relative: 9 %
Neutro Abs: 5.2 10*3/uL (ref 1.7–7.7)
Neutrophils Relative %: 67 %
Platelet Count: 271 10*3/uL (ref 150–400)
RBC: 4.77 MIL/uL (ref 4.22–5.81)
RDW: 14.3 % (ref 11.5–15.5)
WBC Count: 7.8 10*3/uL (ref 4.0–10.5)
nRBC: 0 % (ref 0.0–0.2)

## 2020-08-31 LAB — CMP (CANCER CENTER ONLY)
ALT: 10 U/L (ref 0–44)
AST: 23 U/L (ref 15–41)
Albumin: 3 g/dL — ABNORMAL LOW (ref 3.5–5.0)
Alkaline Phosphatase: 78 U/L (ref 38–126)
Anion gap: 13 (ref 5–15)
BUN: <4 mg/dL — ABNORMAL LOW (ref 6–20)
CO2: 25 mmol/L (ref 22–32)
Calcium: 8.6 mg/dL — ABNORMAL LOW (ref 8.9–10.3)
Chloride: 101 mmol/L (ref 98–111)
Creatinine: 0.82 mg/dL (ref 0.61–1.24)
GFR, Estimated: >60 mL/min (ref >60)
Glucose, Bld: 99 mg/dL (ref 70–99)
Potassium: 2.8 mmol/L — ABNORMAL LOW (ref 3.5–5.1)
Sodium: 139 mmol/L (ref 135–145)
Total Bilirubin: 0.6 mg/dL (ref 0.3–1.2)
Total Protein: 6.3 g/dL — ABNORMAL LOW (ref 6.5–8.1)

## 2020-08-31 LAB — TSH: TSH: 17.887 u[IU]/mL — ABNORMAL HIGH (ref 0.320–4.118)

## 2020-08-31 MED ORDER — SODIUM CHLORIDE 0.9% FLUSH
10.0000 mL | Freq: Once | INTRAVENOUS | Status: AC
Start: 2020-08-31 — End: 2020-08-31
  Administered 2020-08-31: 10 mL
  Filled 2020-08-31: qty 10

## 2020-08-31 MED ORDER — POTASSIUM CHLORIDE CRYS ER 20 MEQ PO TBCR: 20.0000 meq | EXTENDED_RELEASE_TABLET | Freq: Every day | ORAL | 0 refills | Status: DC

## 2020-08-31 MED ORDER — HEPARIN SOD (PORK) LOCK FLUSH 100 UNIT/ML IV SOLN
500.0000 [IU] | Freq: Once | INTRAVENOUS | Status: AC
  Administered 2020-08-31: 500 [IU] via INTRAVENOUS
  Filled 2020-08-31: qty 5

## 2020-08-31 NOTE — Progress Notes (Signed)
[place] [phone]   Fax:([phone]  OFFICE PROGRESS NOTE  [name], NP [address] [name] [address] [place] [address]  DIAGNOSIS: Stage IV (T3, N0, M1C) non-small cell lung cancer, adenocarcinoma.The patient presented with a right lower lobe/infrahilar mass as well as a solitary brain metastasis in the left cerebellum. He was diagnosed in July 2021.  Molecular Biomarkers:  MSI-High DETECTED Pembrolizumab Atezolizumab, Avelumab, Cemiplimab, Dostarlimab, Durvalumab, Ipilimumab, Nivolumab  STK11Splice Site SNV 2.7% Everolimus, Temsirolimus Yes  KRASG12D 1.7% Binimetinib Yes  [id number] 0.4%  Niraparib, Olaparib, Rucaparib, Talazoparib, Tazemetostat Yes  PRIOR THERAPY:  1) SRS to the solitary brain metastasis under the care of Dr. Lisbeth Renshaw. Last treatment 11/14/19. 2) Weekly concurrent chemoradiation with carboplatin for an AUC of 2, paclitaxel 45 mg/m2.First dose expected on 11/25/2019. Status post 7 cycles, last dose was giving 01/06/2020 with partial response.   CURRENT THERAPY:  1)  Immunotherapy with Keytruda 200 mg IV every 3 weeks.  First dose February 10, 2020 for a patient with MSI high.  Status post 8 cycles. 2) Avastin 15 mg/KG every 3 weeks.  First dose today for the vasogenic edema of the brain.S/P 7 cycles.  INTERVAL HISTORY: Joseph Atkins 60 y.o. male returns to the clinic today for follow-up visit accompanied by his wife.  The patient is feeling fine today with no concerning complaints except for pain in the knees bilaterally with some shaking movement in the knees when he moves.  He also has cough productive of clear phlegm with intermittent jaw pain.  The patient unfortunately continues to smoke 0.5 pack/day and I strongly encouraged him to quit smoking.  He denied having any current chest pain, shortness of breath but has cough with no hemoptysis.  He has no fever or chills he has no headache or  visual changes.  He has been tolerating his treatment with Keytruda fairly well.  The patient is here today for evaluation before starting cycle #9 of his treatment tomorrow.   MEDICAL HISTORY: Past Medical History:  Diagnosis Date  . Cancer ([place])    lung cancer  . Diverticulosis   . GERD (gastroesophageal reflux disease)   . Hx of small bowel obstruction   . Hyperlipidemia   . Hypertension   . Hypothyroidism   . IBS (irritable bowel syndrome)   . Substance abuse (HCC)    Alcoholic, Drug addition  . Thyroid disease     ALLERGIES:  is allergic to penicillins.  MEDICATIONS:  Current Outpatient Medications  Medication Sig Dispense Refill  . albuterol (PROVENTIL HFA;VENTOLIN HFA) 108 (90 Base) MCG/ACT inhaler Inhale 2 puffs into the lungs every 6 (six) hours as needed for wheezing or shortness of breath. 1 Inhaler 0  . ANORO ELLIPTA 62.5-25 MCG/INH AEPB 1 puff daily.    . cyclobenzaprine (FLEXERIL) 10 MG tablet Take 10 mg by mouth 2 (two) times daily as needed for muscle spasms. (Patient not taking: No sig reported)    . dexamethasone (DECADRON) 2 MG tablet Take 2 tablets (4 mg total) by mouth 2 (two) times daily. (Patient taking differently: Take 2 mg by mouth 2 (two) times daily.) 120 tablet 2  . docusate sodium (COLACE) 100 MG capsule Take 100 mg by mouth daily.    . DULoxetine (CYMBALTA) 20 MG capsule Take 1 capsule (20 mg total) by mouth 2 (two) times daily. 60 capsule 5  . fluticasone furoate-vilanterol (BREO ELLIPTA) 100-25 MCG/INH AEPB Inhale 1 puff into the lungs  daily. 30 each 3  . ibuprofen (ADVIL) 800 MG tablet Take 800 mg by mouth 3 (three) times daily.    Marland Kitchen levothyroxine (SYNTHROID) 100 MCG tablet Take 1 tablet (100 mcg total) by mouth daily. 30 tablet 3  . lidocaine (XYLOCAINE) 2 % solution Use as directed 15 mLs in the mouth or throat every 6 (six) hours as needed for mouth pain. Do not eat/drink within 60 minutes of taking this medication (Patient not taking: No sig  reported) 100 mL 0  . lidocaine-prilocaine (EMLA) cream Apply 1 application topically as needed. 30 g 0  . omeprazole (PRILOSEC) 40 MG capsule Take 1 capsule (40 mg total) by mouth daily. 90 capsule 4  . ondansetron (ZOFRAN) 4 MG tablet Take 1 tablet (4 mg total) by mouth every 8 (eight) hours as needed. 40 tablet 2  . oxyCODONE-acetaminophen (PERCOCET/ROXICET) 5-325 MG tablet Take 1 tablet by mouth every 4 (four) hours as needed for severe pain. (Patient not taking: No sig reported) 30 tablet 0  . polyethylene glycol powder (MIRALAX) powder Take 17 g by mouth daily. 255 g 11  . prochlorperazine (COMPAZINE) 10 MG tablet Take 1 tablet (10 mg total) by mouth every 6 (six) hours as needed. (Patient not taking: No sig reported) 30 tablet 2  . sucralfate (CARAFATE) 1 g tablet Take 1 tablet (1 g total) by mouth 4 (four) times daily. Dissolve each tablet in 15 cc water before use. (Patient not taking: No sig reported) 120 tablet 2  . TRULANCE 3 MG TABS Take 1 tablet by mouth daily.    Marland Kitchen zolpidem (AMBIEN CR) 12.5 MG CR tablet Take 1 tablet (12.5 mg total) by mouth at bedtime as needed for sleep. (Patient not taking: No sig reported) 30 tablet 5   No current facility-administered medications for this visit.    SURGICAL HISTORY:  Past Surgical History:  Procedure Laterality Date  . arm surgery Right   . BRONCHIAL BRUSHINGS  10/24/2019   Procedure: BRONCHIAL BRUSHINGS;  Surgeon: Collene Gobble, MD;  Location: [facility] ENDOSCOPY;  Service: Cardiopulmonary;;  right lower lobe   . BRONCHIAL BRUSHINGS  11/05/2019   Procedure: BRONCHIAL BRUSHINGS;  Surgeon: Collene Gobble, MD;  Location: [facility] ENDOSCOPY;  Service: Pulmonary;;  . BRONCHIAL NEEDLE ASPIRATION BIOPSY  10/24/2019   Procedure: BRONCHIAL NEEDLE ASPIRATION BIOPSIES;  Surgeon: Collene Gobble, MD;  Location: [place];  Service: Cardiopulmonary;;  . BRONCHIAL NEEDLE ASPIRATION BIOPSY  11/05/2019   Procedure: BRONCHIAL NEEDLE ASPIRATION BIOPSIES;  Surgeon:  Collene Gobble, MD;  Location: [facility] ENDOSCOPY;  Service: Pulmonary;;  . ENDOBRONCHIAL ULTRASOUND N/A 10/24/2019   Procedure: ENDOBRONCHIAL ULTRASOUND;  Surgeon: Collene Gobble, MD;  Location: [place];  Service: Cardiopulmonary;  Laterality: N/A;  . FINGER SURGERY Right    Middle  . IR IMAGING GUIDED PORT INSERTION  11/19/2019  . VIDEO BRONCHOSCOPY N/A 10/24/2019   Procedure: VIDEO BRONCHOSCOPY WITHOUT FLUORO;  Surgeon: Collene Gobble, MD;  Location[facility] ENDOSCOPY;  Service: Cardiopulmonary;  Laterality: N/A;  . VIDEO BRONCHOSCOPY WITH ENDOBRONCHIAL NAVIGATION N/A 11/05/2019   Procedure: VIDEO BRONCHOSCOPY WITH ENDOBRONCHIAL NAVIGATION;  Surgeon: Collene Gobble, MD;  Location: [place] ENDOSCOPY;  Service: Pulmonary;  Laterality: N/A;    REVIEW OF SYSTEMS:  A comprehensive review of systems was negative except for: Constitutional: positive for fatigue Respiratory: positive for cough Musculoskeletal: positive for arthralgias and muscle weakness   PHYSICAL EXAMINATION: General appearance: alert, cooperative, fatigued and no distress Head: Normocephalic, without obvious abnormality, atraumatic Neck: no adenopathy,  no JVD, supple, symmetrical, trachea midline and thyroid not enlarged, symmetric, no tenderness/mass/nodules Lymph nodes: Cervical, supraclavicular, and axillary nodes normal. Resp: clear to auscultation bilaterally Back: symmetric, no curvature. ROM normal. No CVA tenderness. Cardio: regular rate and rhythm, S1, S2 normal, no murmur, click, rub or gallop GI: soft, non-tender; bowel sounds normal; no masses,  no organomegaly Extremities: extremities normal, atraumatic, no cyanosis or edema  ECOG PERFORMANCE STATUS: 1 - Symptomatic but completely ambulatory  Blood pressure (!) 145/102, pulse 91, temperature (!) 97.4 F (36.3 C), temperature source Tympanic, resp. rate 18, weight 163 lb 14.4 oz (74.3 kg), SpO2 100 %.  LABORATORY DATA: Lab Results  Component Value Date   WBC 8.8  08/11/2020   HGB 14.9 08/11/2020   HCT 45.8 08/11/2020   MCV 90.5 08/11/2020   PLT 244 08/11/2020      Chemistry      Component Value Date/Time   NA 135 08/11/2020 0836   NA 141 03/08/2017 1707   K 3.3 (L) 08/11/2020 0836   CL 97 (L) 08/11/2020 0836   CO2 25 08/11/2020 0836   BUN <4 (L) 08/11/2020 0836   BUN 12 03/08/2017 1707   CREATININE 0.94 08/11/2020 0836      Component Value Date/Time   CALCIUM 8.9 08/11/2020 0836   ALKPHOS 66 08/11/2020 0836   AST 22 08/11/2020 0836   ALT 10 08/11/2020 0836   BILITOT 0.6 08/11/2020 0836       RADIOGRAPHIC STUDIES: No results found.  ASSESSMENT AND PLAN: This is a very pleasant 60 years old white male with a stage IV (t3, N0, M1c) non-small cell lung cancer, adenocarcinoma with MSI high presented with right lower lobe/infrahilar mass in addition to solitary brain metastasis in the left cerebellum diagnosed in July 2021. He is status post SRS to the solitary brain metastasis. The patient completed a course of concurrent chemoradiation with weekly carboplatin and paclitaxel.  He tolerated the treatment well except for fatigue and mild odynophagia. He had repeat CT scan of the chest performed recently.  I personally and independently reviewed the scans and discussed the results with the patient and his wife. His scan showed interval decrease in the volume of the right infrahilar mass with no other evidence of metastatic disease. The patient has MSI high and I recommended for him treatment with immunotherapy with single agent Keytruda 200 mg IV every 3 weeks for a total of 2 years unless the patient has unacceptable toxicity or disease progression. He is status post 8 cycles of treatment with Keytruda.  He also received 7 cycles of Avastin for the vasogenic edema in the brain.   The patient has been tolerating this treatment well with no concerning adverse effects. I recommended for him to proceed with cycle #9 of his treatment with [facility]  tomorrow as planned.  Avastin will be on hold for now unless needed in the future. For the metastatic brain lesions, he is followed by Dr. Mickeal Skinner and Dr. Lisbeth Renshaw.  He is scheduled to have MRI of the brain next month. I will see the patient back for follow-up visit in 3 weeks for evaluation with repeat CT scan of the chest, abdomen and pelvis for restaging of his disease. I also strongly encouraged the patient to quit smoking and he will be work on it. The patient was advised to call immediately if he has any concerning symptoms in the interval. The patient voices understanding of current disease status and treatment options and is in agreement with  the current care plan.  All questions were answered. The patient knows to call the clinic with any problems, questions or concerns. We can certainly see the patient much sooner if necessary.  Disclaimer: This note was dictated with voice recognition software. Similar sounding words can inadvertently be transcribed and may not be corrected upon review.

## 2020-09-01 ENCOUNTER — Inpatient Hospital Stay: Payer: Managed Care, Other (non HMO)

## 2020-09-01 ENCOUNTER — Other Ambulatory Visit: Payer: Managed Care, Other (non HMO)

## 2020-09-01 ENCOUNTER — Ambulatory Visit: Payer: Managed Care, Other (non HMO) | Admitting: Internal Medicine

## 2020-09-01 VITALS — BP 140/90 | HR 95 | Temp 97.7°F | Resp 16

## 2020-09-01 DIAGNOSIS — C3431 Malignant neoplasm of lower lobe, right bronchus or lung: Secondary | ICD-10-CM | POA: Diagnosis not present

## 2020-09-01 DIAGNOSIS — C3491 Malignant neoplasm of unspecified part of right bronchus or lung: Secondary | ICD-10-CM

## 2020-09-01 MED ORDER — SODIUM CHLORIDE 0.9 % IV SOLN
Freq: Once | INTRAVENOUS | Status: AC
Start: 2020-09-01 — End: 2020-09-01
  Filled 2020-09-01: qty 250

## 2020-09-01 MED ORDER — SODIUM CHLORIDE 0.9 % IV SOLN
200.0000 mg | Freq: Once | INTRAVENOUS | Status: AC
  Administered 2020-09-01: 200 mg via INTRAVENOUS
  Filled 2020-09-01: qty 8

## 2020-09-01 MED ORDER — HEPARIN SOD (PORK) LOCK FLUSH 100 UNIT/ML IV SOLN
500.0000 [IU] | Freq: Once | INTRAVENOUS | Status: AC | PRN
  Administered 2020-09-01: 500 [IU]
  Filled 2020-09-01: qty 5

## 2020-09-01 MED ORDER — SODIUM CHLORIDE 0.9% FLUSH
10.0000 mL | INTRAVENOUS | Status: DC | PRN
  Administered 2020-09-01: 10 mL
  Filled 2020-09-01: qty 10

## 2020-09-01 NOTE — Patient Instructions (Signed)
[facility]  Discharge Instructions: Thank you for choosing [facility] to provide your oncology and hematology care.   If you have a lab appointment with the [facility], please go directly to the [facility] and check in at the registration area.   Wear comfortable clothing and clothing appropriate for easy access to any Portacath or PICC line.   We strive to give you quality time with your provider. You may need to reschedule your appointment if you arrive late (15 or more minutes).  Arriving late affects you and other patients whose appointments are after yours.  Also, if you miss three or more appointments without notifying the office, you may be dismissed from the clinic at the provider's discretion.      For prescription refill requests, have your pharmacy contact our office and allow 72 hours for refills to be completed.    Today you received the following chemotherapy and/or immunotherapy agents: pembrolizumab.      To help prevent nausea and vomiting after your treatment, we encourage you to take your nausea medication as directed.  BELOW ARE SYMPTOMS THAT SHOULD BE REPORTED IMMEDIATELY: . *FEVER GREATER THAN 100.4 F (38 C) OR HIGHER . *CHILLS OR SWEATING . *NAUSEA AND VOMITING THAT IS NOT CONTROLLED WITH YOUR NAUSEA MEDICATION . *UNUSUAL SHORTNESS OF BREATH . *UNUSUAL BRUISING OR BLEEDING . *URINARY PROBLEMS (pain or burning when urinating, or frequent urination) . *BOWEL PROBLEMS (unusual diarrhea, constipation, pain near the anus) . TENDERNESS IN MOUTH AND THROAT WITH OR WITHOUT PRESENCE OF ULCERS (sore throat, sores in mouth, or a toothache) . UNUSUAL RASH, SWELLING OR PAIN  . UNUSUAL VAGINAL DISCHARGE OR ITCHING   Items with * indicate a potential emergency and should be followed up as soon as possible or go to the Emergency Department if any problems should occur.  Please show the CHEMOTHERAPY ALERT CARD or IMMUNOTHERAPY  ALERT CARD at check-in to the Emergency Department and triage nurse.  Should you have questions after your visit or need to cancel or reschedule your appointment, please contact [facility]  Dept: [phone]  and follow the prompts.  Office hours are 8:00 a.m. to 4:30 p.m. Monday - Friday. Please note that voicemails left after 4:00 p.m. may not be returned until the following business day.  We are closed weekends and major holidays. You have access to a nurse at all times for urgent questions. Please call the main number to the clinic Dept: [phone] and follow the prompts.   For any non-urgent questions, you may also contact your provider using MyChart. We now offer e-Visits for anyone 49 and older to request care online for non-urgent symptoms. For details visit [email or web address].   Also download the MyChart app! Go to the app store, search "MyChart", open the app, select [facility], and log in with your MyChart username and password.  Due to Covid, a mask is required upon entering the hospital/clinic. If you do not have a mask, one will be given to you upon arrival. For doctor visits, patients may have 1 support person aged 40 or older with them. For treatment visits, patients cannot have anyone with them due to current Covid guidelines and our immunocompromised population.

## 2020-09-02 ENCOUNTER — Telehealth: Payer: Self-pay

## 2020-09-02 NOTE — Telephone Encounter (Signed)
-----   Message from Girard, PA-C sent at 08/31/2020  8:43 PM EDT ----- Just want to make sure these messages are being seen. I do not see any telephone encounters for these result notes today that Dr. Julien Nordmann had sent to the POD.  ----- Message ----- From: Curt Bears, MD Sent: 08/31/2020  10:26 AM EDT To: [facility]  I will send prescription for potassium chloride.  Please ask him to pick it up from his pharmacy and take it daily.  Thank you. ----- Message ----- From: Buel Ream, [facility] [place] Sent: 08/31/2020   9:20 AM EDT To: Curt Bears, MD

## 2020-09-02 NOTE — Telephone Encounter (Signed)
I spoke with the pt and advised as indicated. Pt expressed understanding of this information. 

## 2020-09-10 ENCOUNTER — Other Ambulatory Visit: Payer: Self-pay

## 2020-09-10 ENCOUNTER — Ambulatory Visit
Admission: RE | Admit: 2020-09-10 | Discharge: 2020-09-10 | Disposition: A | Discharge status: Home / Self Care | Payer: Managed Care, Other (non HMO) | Source: Ambulatory Visit | Attending: Radiation Oncology | Admitting: Radiation Oncology

## 2020-09-10 ENCOUNTER — Encounter: Payer: Self-pay | Admitting: Radiation Oncology

## 2020-09-10 DIAGNOSIS — C7949 Secondary malignant neoplasm of other parts of nervous system: Secondary | ICD-10-CM

## 2020-09-10 DIAGNOSIS — C7931 Secondary malignant neoplasm of brain: Secondary | ICD-10-CM

## 2020-09-10 IMAGING — MR MR HEAD WO/W CM
12 series · 48 of 48 positions shown · IV contrast (multihance)
Comparison: [DATE]

CLINICAL DATA: Follow-up metastatic disease

EXAM:
MRI HEAD WITHOUT AND WITH CONTRAST
TECHNIQUE: Multiplanar, multiecho pulse sequences of the brain and surrounding
structures were obtained without and with intravenous contrast.
CONTRAST:  15mL MULTIHANCE GADOBENATE DIMEGLUMINE 529 MG/ML IV SOLN

[Series 3: FLAIR · sagittal · 3.0mm · 0.75mm/px · 3 of 39 slices shown (1 of 2)]
[im 1/39]
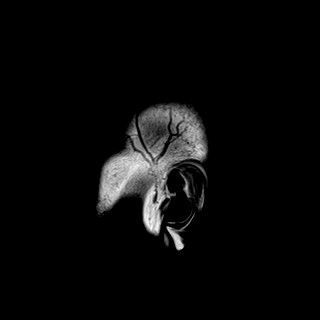
[im 20/39]
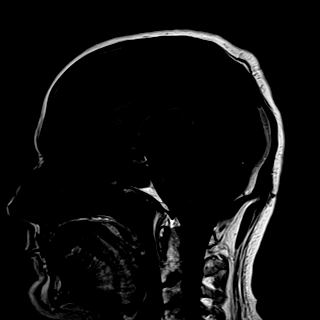
[im 39/39]
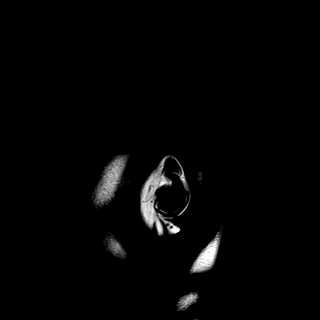

[Series 4: DWI · axial · 3.0mm · 1.50mm/px · z∈[-77,+72]mm · 4 of 78 slices shown (1 of 2)]
[im 1/78]
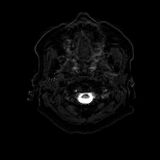
[im 26/78]
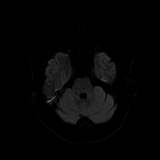
[im 52/78]
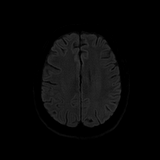
[im 78/78]
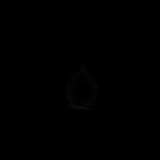

[Series 5: DWI · axial · 3.0mm · 1.50mm/px · z∈[-77,+72]mm · 2 of 39 slices shown (2 of 2)]
[im 1/39]
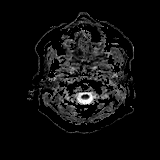
[im 39/39]
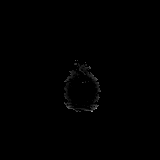

[Series 6: T2 · axial · 5.0mm · 0.57mm/px · 1 of 27 slices shown (1 of 2)]
[im 1/27]
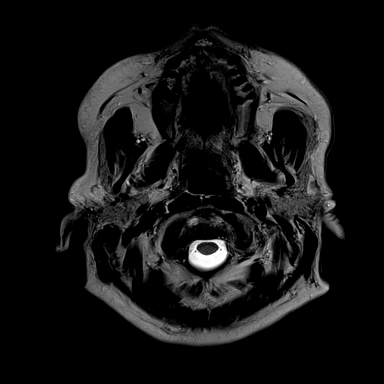

[Series 7: swi_images · axial · 1.5mm · 0.90mm/px · z∈[-73,+69]mm · 5 of 96 slices shown]
[im 1/96]
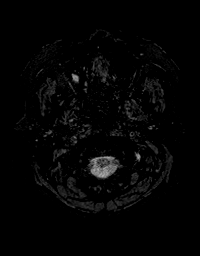
[im 24/96]
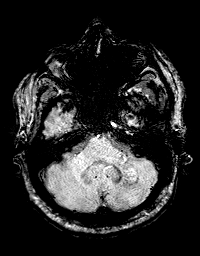
[im 48/96]
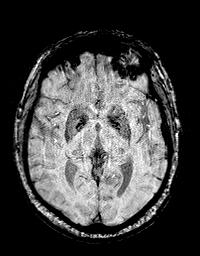
[im 72/96]
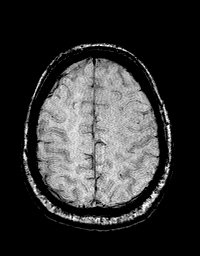
[im 96/96]
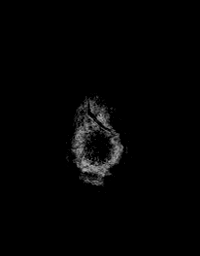

[Series 9: FLAIR · axial · 3.0mm · 0.86mm/px · z∈[-76,+80]mm · 3 of 53 slices shown (2 of 2)]
[im 1/53]
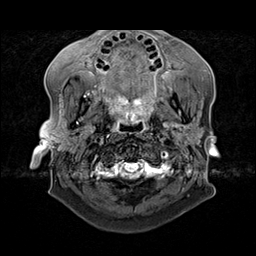
[im 27/53]
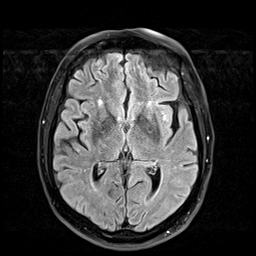
[im 53/53]
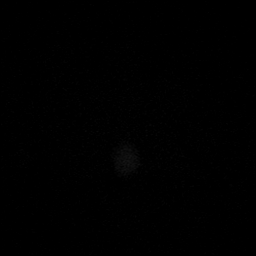

[Series 10: T2 · axial · non-contrast · 1.0mm · 0.86mm/px · z∈[-77,+78]mm · 8 of 160 slices shown (2 of 2)]
[im 1/160]
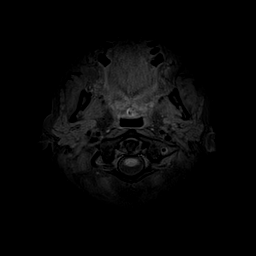
[im 23/160]
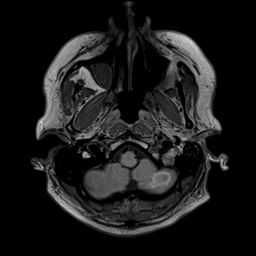
[im 46/160]
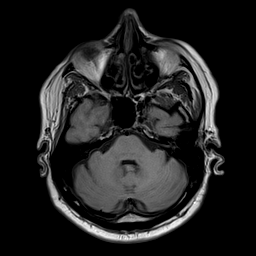
[im 69/160]
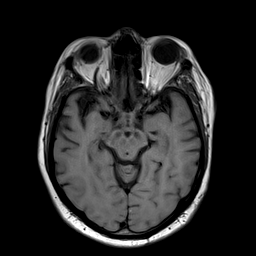
[im 91/160]
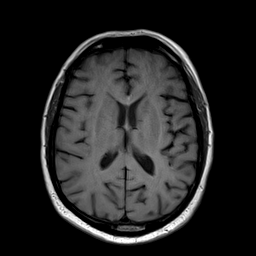
[im 114/160]
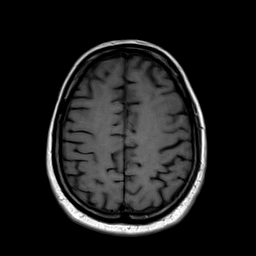
[im 137/160]
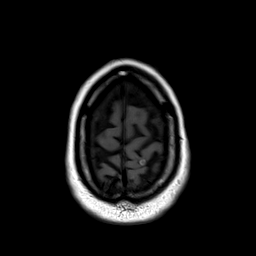
[im 160/160]
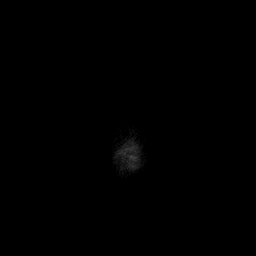

[Series 11: T2 post-contrast · coronal · 3.0mm · 0.57mm/px · 2 of 47 slices shown (1 of 2)]
[im 1/47]
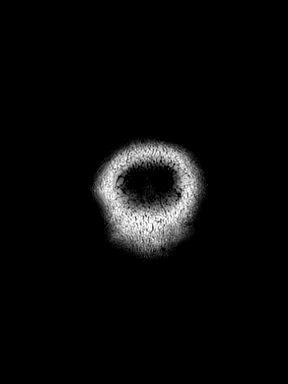
[im 47/47]
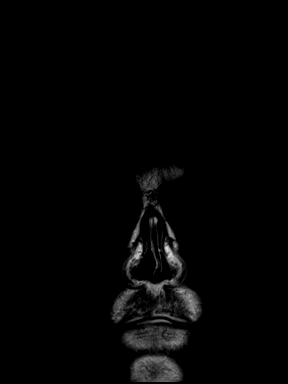

[Series 12: T2 post-contrast · axial · 1.0mm · 0.86mm/px · z∈[-77,+78]mm · 8 of 160 slices shown (2 of 2)]
[im 1/160]
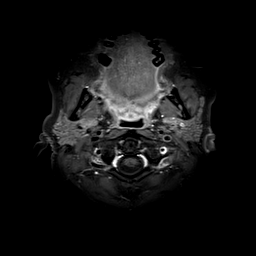
[im 23/160]
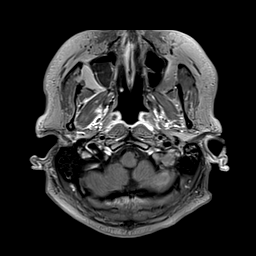
[im 46/160]
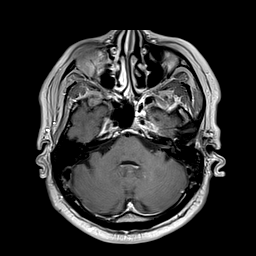
[im 69/160]
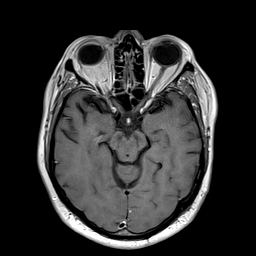
[im 91/160]
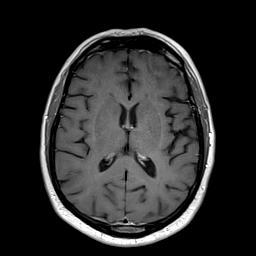
[im 114/160]
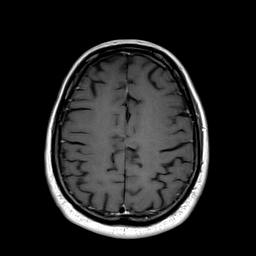
[im 137/160]
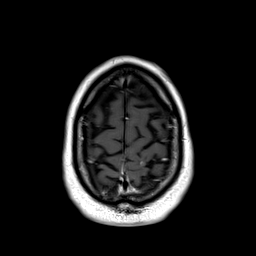
[im 160/160]
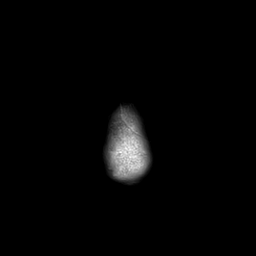

[Series 13: T1 post-contrast · axial · 1.0mm · 0.75mm/px · z∈[-78,+81]mm · 8 of 160 slices shown (1 of 2)]
[im 1/160]
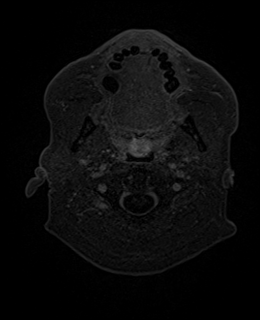
[im 23/160]
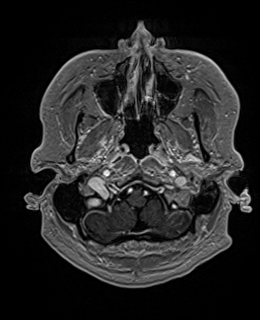
[im 46/160]
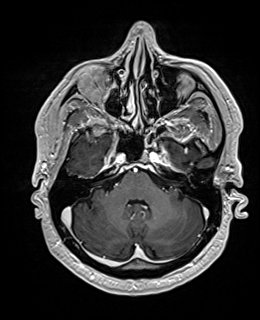
[im 69/160]
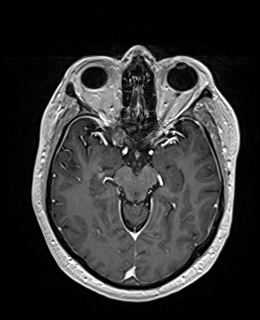
[im 91/160]
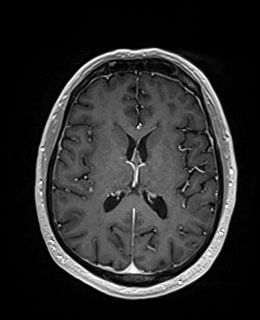
[im 114/160]
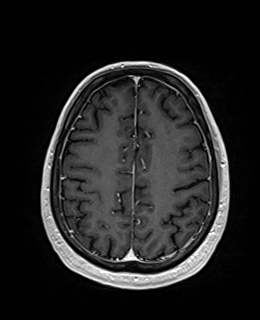
[im 137/160]
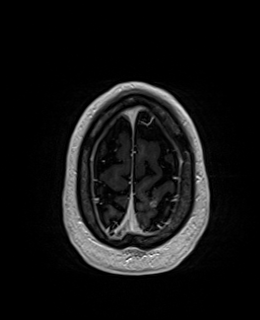
[im 160/160]
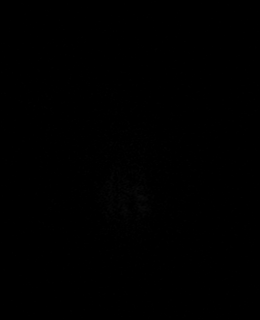

[Series 14: T1 post-contrast · coronal · 3.0mm · 0.57mm/px · 2 of 47 slices shown (2 of 2)]
[im 1/47]
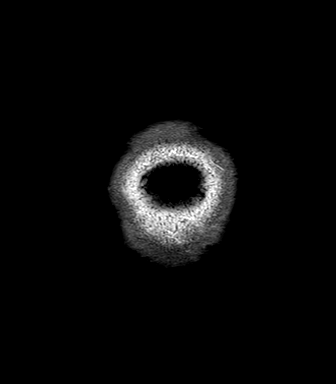
[im 47/47]
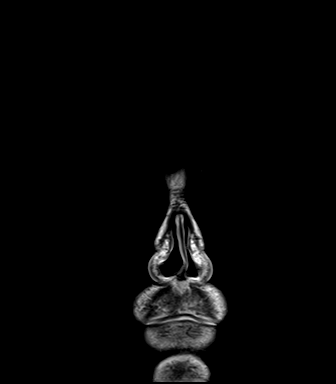

[Series 15: FLAIR post-contrast · sagittal · 3.0mm · 0.75mm/px · 2 of 39 slices shown]
[im 1/39]
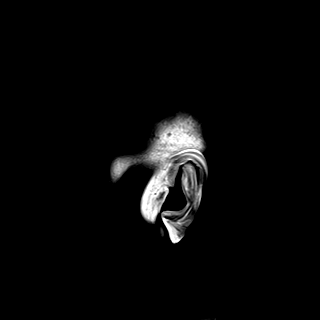
[im 39/39]
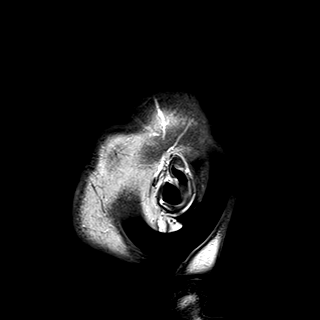

[48 of 48 positions shown; findings below may reference images not displayed]

FINDINGS: Brain: No new or progressive disease.

Dominant treated lesion in the left cerebellum continues to show
peripheral T1 hyperintensity 4 contrast with no additional visible
enhancement. Overall maximal axial diameter is 2.8 x 2.2 cm,
compared with 3.1 x 2.5 cm previously, indicating slight
contraction. No significant edema.

Small metastasis previously seen in the right lateral cerebellum
axial image 26 is no longer visible.

Tiny right occipital lesion previously seen axial image 67 is no
longer visible.

Punctate focus of enhancement left medial occipital lobe axial image
69 is smaller.

Right occipital lesion previously seen axial image 74 is no longer
visible.

Left frontal lesion axial image 88 previously seen is no longer
visible.

Right parietal lesion axial image 99 is no longer visible.

Right lateral frontoparietal junction lesion axial image 125 is
smaller, measuring 2.5 mm.

Left posterior frontal lesion axial image 125 is no longer visible.

Left frontoparietal vertex lesion axial image 139 is smaller.

Mild chronic small-vessel ischemic changes appear the same. No
evidence of hydrocephalus or extra-axial collection.

Vascular: Major vessels at the base of the brain show flow.

Skull and upper cervical spine: Negative

Sinuses/Orbits: Retention cyst right maxillary sinus as seen
previously. Tiny left mastoid effusion as seen previously. Orbits
negative.

Other: None
IMPRESSION: No new or progressive disease.

Treated left cerebellar lesion is smaller, having contracted from 31
x 25 mm previously to a measurement of 28 x 22 mm today. Precontrast
peripheral T1 hyperintensity without evidence of actual additional
contrast enhancement. No edema.

Many of the other previously described lesions are frankly invisible
today. Tiny enhancing foci which have gotten smaller are noted in
the left medial occipital lobe, right lateral frontoparietal
junction region and left frontoparietal vertex.

## 2020-09-10 MED ORDER — GADOBENATE DIMEGLUMINE 529 MG/ML IV SOLN
15.0000 mL | Freq: Once | INTRAVENOUS | Status: AC | PRN
  Administered 2020-09-10: 15 mL via INTRAVENOUS

## 2020-09-10 MED ORDER — SODIUM CHLORIDE 0.9% FLUSH
10.0000 mL | INTRAVENOUS | Status: DC | PRN
  Administered 2020-09-10: 10 mL via INTRAVENOUS

## 2020-09-10 MED ORDER — HEPARIN SOD (PORK) LOCK FLUSH 100 UNIT/ML IV SOLN
500.0000 [IU] | Freq: Once | INTRAVENOUS | Status: AC
  Administered 2020-09-10: 500 [IU] via INTRAVENOUS

## 2020-09-10 NOTE — Progress Notes (Addendum)
Spoke with patient today to do meaningful use. Patient denies any pain today. Reports moderate fatigue.Patient reports some nausea and vomiting .States that he has medication for relief.Patient reports a steady gait. Patient denies any issues with his hand coordination. Patient is aware that this is a virtual visit

## 2020-09-14 ENCOUNTER — Ambulatory Visit
Admission: RE | Admit: 2020-09-14 | Discharge: 2020-09-14 | Disposition: A | Discharge status: Home / Self Care | Payer: 59 | Source: Ambulatory Visit | Attending: Radiation Oncology | Admitting: Radiation Oncology

## 2020-09-14 ENCOUNTER — Inpatient Hospital Stay: Payer: Managed Care, Other (non HMO)

## 2020-09-14 DIAGNOSIS — C3491 Malignant neoplasm of unspecified part of right bronchus or lung: Secondary | ICD-10-CM

## 2020-09-14 DIAGNOSIS — C7931 Secondary malignant neoplasm of brain: Secondary | ICD-10-CM

## 2020-09-14 NOTE — Progress Notes (Signed)
Radiation Oncology         [phone] ________________________________  Outpatient Follow Up - Conducted via telephone due to current COVID-19 concerns for limiting patient exposure  I spoke with the patient to conduct this consult visit via telephone to spare the patient unnecessary potential exposure in the healthcare setting during the current COVID-19 pandemic. The patient was notified in advance and was offered a [facility] meeting to allow for face to face communication but unfortunately reported that they did not have the appropriate resources/technology to support such a visit and instead preferred to proceed with a telephone visit.   Name: Joseph Atkins MRN: [id number]  Date of Service: 09/14/2020  DOB: [birth year]  Follow Up   Diagnosis:  Stage IV, cT3, N0, M1c, non-small cell lung cancer, adenocarcinoma of the right lower lobe  Interval Since Last Radiation:  3 months  06/19/2020 SRS Treatment: Each site was treated to 20 Gy in 1 fraction PTV_14 Rt Cereb 1mm PTV_13 Rt Occ 23mm PTV_12 Rt Occ 65mm PTV_11 Lt Front 53mm PTV_10 Rt Par 51mm PTV_9 Lt Par 16mm  03/05/20 SRS Treatment: The following lesions were treated to 20 Gy in 1 fraction PTV2 Rt Temp 23mm PTV3 Lt Occ 36mm PTV4 Lt Occ 39mm PTV5 Rt Temp 71mm PTV6 Lt Occ 89mm PTV7 Rt Par 19mm PTV8 Lt Par 48mm  11/25/19 - 01/09/20: The patient was treated to the disease within the right lung initially to a dose of 60 Gy using a 4 field, 3-D conformal technique. The patient then received a cone down boost treatment for an additional 6 Gy. This yielded a final total dose of 66 Gy.   11/14/19 SRS Treatment:  PTV1 Lt Cerebellum 28 mm, 18 Gy in 1 fraction  Narrative: This is a pleasant 60 y.o. male with a history of stage IV non-small cell lung cancer, adenocarcinoma arising in the right lower lobe with what was originally a solitary brain metastasis.  He was treated with stereotactic radiosurgery [facility]) in July 2021, and definitive  chemoradiation to the lung which he completed in September 2021.  He has been receiving consolidative Keytruda with Dr. Julien Nordmann.  He has had a difficult time with trying to taper his steroids with what resulted in rebound edema following the tapering or discontinuing certain doses, and having to go back to steroids even just recently. He had SRS to new disease sites in November 2021 and in February 2022, and his MRI brain on 06/05/20. He tolerated each treatment well and surveillance MRI on 09/11/20 showed    On review of systems, the patient reports that he is doing pretty well overall. He's tolerating the Keytruda infusions but does have itching of his skin, loose stools a few days following, and occasionally some nausea but this is relieved by antiemetics. He does have knee pains as well and has been counseled on using knee braces. He also stops stool softeners when anticipating loose stool, changing his diet to more bland foods, and using otc anti itch creams. He does have some neck pain and occasionally headeaches that he thinks is from his sleeping position, and fatigue related to treatment. He denies any progressive headaches, changes in vision, hearing, speech, or movement. No other complaints are verbalized.   PAST MEDICAL HISTORY:  Past Medical History:  Diagnosis Date  . Cancer ([place])    lung cancer  . Diverticulosis   . GERD (gastroesophageal reflux disease)   . Hx of small bowel obstruction   . Hyperlipidemia   .  Hypertension   . Hypothyroidism   . IBS (irritable bowel syndrome)   . Substance abuse ([facility])    Alcoholic, Drug addition  . Thyroid disease     PAST SURGICAL HISTORY: Past Surgical History:  Procedure Laterality Date  . arm surgery Right   . BRONCHIAL BRUSHINGS  10/24/2019   Procedure: BRONCHIAL BRUSHINGS;  Surgeon: Collene Gobble, MD;  Location[facility] ENDOSCOPY;  Service: Cardiopulmonary;;  right lower lobe   . BRONCHIAL BRUSHINGS  11/05/2019   Procedure: BRONCHIAL  BRUSHINGS;  Surgeon: Collene Gobble, MD;  Location: [facility] ENDOSCOPY;  Service: Pulmonary;;  . BRONCHIAL NEEDLE ASPIRATION BIOPSY  10/24/2019   Procedure: BRONCHIAL NEEDLE ASPIRATION BIOPSIES;  Surgeon: Collene Gobble, MD;  Location: [facility];  Service: Cardiopulmonary;;  . BRONCHIAL NEEDLE ASPIRATION BIOPSY  11/05/2019   Procedure: BRONCHIAL NEEDLE ASPIRATION BIOPSIES;  Surgeon: Collene Gobble, MD;  Location[facility] ENDOSCOPY;  Service: Pulmonary;;  . ENDOBRONCHIAL ULTRASOUND N/A 10/24/2019   Procedure: ENDOBRONCHIAL ULTRASOUND;  Surgeon: Collene Gobble, MD;  Location: [facility];  Service: Cardiopulmonary;  Laterality: N/A;  . FINGER SURGERY Right    Middle  . IR IMAGING GUIDED PORT INSERTION  11/19/2019  . VIDEO BRONCHOSCOPY N/A 10/24/2019   Procedure: VIDEO BRONCHOSCOPY WITHOUT FLUORO;  Surgeon: Collene Gobble, MD;  Location[facility] ENDOSCOPY;  Service: Cardiopulmonary;  Laterality: N/A;  . VIDEO BRONCHOSCOPY WITH ENDOBRONCHIAL NAVIGATION N/A 11/05/2019   Procedure: VIDEO BRONCHOSCOPY WITH ENDOBRONCHIAL NAVIGATION;  Surgeon: Collene Gobble, MD;  Location: [facility] ENDOSCOPY;  Service: Pulmonary;  Laterality: N/A;    PAST SOCIAL HISTORY:  Social History   Socioeconomic History  . Marital status: Married    Spouse name: Not on file  . Number of children: 2  . Years of education: Not on file  . Highest education level: Not on file  Occupational History  . Occupation: Buyer, retail: [employer]  Tobacco Use  . Smoking status: Current Every Day Smoker    Packs/day: 1.00    Types: Cigarettes  . Smokeless tobacco: Former Systems developer    Types: Secondary school teacher  . Vaping Use: Never used  Substance and Sexual Activity  . Alcohol use: No    Comment: Alcoholic clean for 6 years  . Drug use: No    Comment: Recovering Drug Addict-clean for 6 years  . Sexual activity: Not on file  Other Topics Concern  . Not on file  Social History Narrative  . Not on file   Social Determinants of Health    Financial Resource Strain: Medium Risk  . Difficulty of Paying Living Expenses: Somewhat hard  Food Insecurity: No Food Insecurity  . Worried About Charity fundraiser in the Last Year: Never true  . Ran Out of Food in the Last Year: Never true  Transportation Needs: No Transportation Needs  . Lack of Transportation (Medical): No  . Lack of Transportation (Non-Medical): No  Physical Activity: Insufficiently Active  . Days of Exercise per Week: 5 days  . Minutes of Exercise per Session: 20 min  Stress: Stress Concern Present  . Feeling of Stress : Very much  Social Connections: Moderately Isolated  . Frequency of Communication with Friends and Family: More than three times a week  . Frequency of Social Gatherings with Friends and Family: More than three times a week  . Attends Religious Services: Never  . Active Member of Clubs or Organizations: No  . Attends Archivist Meetings: Never  . Marital Status:  Married  Intimate Partner Violence: Not At Risk  . Fear of Current or Ex-Partner: No  . Emotionally Abused: No  . Physically Abused: No  . Sexually Abused: No  The patient is married and lives in [place].   PAST FAMILY HISTORY: Family History  Problem Relation Age of Onset  . Epilepsy Mother   . Heart disease Mother   . Heart disease Brother   . Lung cancer Paternal Uncle     MEDICATIONS  Current Outpatient Medications  Medication Sig Dispense Refill  . albuterol (PROVENTIL HFA;VENTOLIN HFA) 108 (90 Base) MCG/ACT inhaler Inhale 2 puffs into the lungs every 6 (six) hours as needed for wheezing or shortness of breath. 1 Inhaler 0  . ANORO ELLIPTA 62.5-25 MCG/INH AEPB 1 puff daily.    Marland Kitchen docusate sodium (COLACE) 100 MG capsule Take 100 mg by mouth daily.    . DULoxetine (CYMBALTA) 20 MG capsule Take 1 capsule (20 mg total) by mouth 2 (two) times daily. 60 capsule 5  . fluticasone furoate-vilanterol (BREO ELLIPTA) 100-25 MCG/INH AEPB Inhale 1 puff into the lungs  daily. 30 each 3  . ibuprofen (ADVIL) 800 MG tablet Take 800 mg by mouth 3 (three) times daily.    Marland Kitchen levothyroxine (SYNTHROID) 100 MCG tablet Take 1 tablet (100 mcg total) by mouth daily. 30 tablet 3  . omeprazole (PRILOSEC) 40 MG capsule Take 1 capsule (40 mg total) by mouth daily. 90 capsule 4  . ondansetron (ZOFRAN) 4 MG tablet Take 1 tablet (4 mg total) by mouth every 8 (eight) hours as needed. 40 tablet 2  . polyethylene glycol powder (MIRALAX) powder Take 17 g by mouth daily. 255 g 11  . potassium chloride SA (KLOR-CON) 20 MEQ tablet Take 1 tablet (20 mEq total) by mouth daily. 10 tablet 0  . prochlorperazine (COMPAZINE) 10 MG tablet Take 1 tablet (10 mg total) by mouth every 6 (six) hours as needed. 30 tablet 2  . TRULANCE 3 MG TABS Take 1 tablet by mouth daily.    . cyclobenzaprine (FLEXERIL) 10 MG tablet Take 10 mg by mouth 2 (two) times daily as needed for muscle spasms. (Patient not taking: No sig reported)    . dexamethasone (DECADRON) 2 MG tablet Take 2 tablets (4 mg total) by mouth 2 (two) times daily. (Patient not taking: Reported on 09/10/2020) 120 tablet 2  . lidocaine (XYLOCAINE) 2 % solution Use as directed 15 mLs in the mouth or throat every 6 (six) hours as needed for mouth pain. Do not eat/drink within 60 minutes of taking this medication (Patient not taking: No sig reported) 100 mL 0  . lidocaine-prilocaine (EMLA) cream Apply 1 application topically as needed. (Patient not taking: Reported on 09/10/2020) 30 g 0  . oxyCODONE-acetaminophen (PERCOCET/ROXICET) 5-325 MG tablet Take 1 tablet by mouth every 4 (four) hours as needed for severe pain. (Patient not taking: No sig reported) 30 tablet 0  . sucralfate (CARAFATE) 1 g tablet Take 1 tablet (1 g total) by mouth 4 (four) times daily. Dissolve each tablet in 15 cc water before use. (Patient not taking: No sig reported) 120 tablet 2  . zolpidem (AMBIEN CR) 12.5 MG CR tablet Take 1 tablet (12.5 mg total) by mouth at bedtime as needed  for sleep. (Patient not taking: No sig reported) 30 tablet 5   No current facility-administered medications for this encounter.    ALLERGIES:  Allergies  Allergen Reactions  . Penicillins Other (See Comments)    Childhood allergy.  Has  patient had a PCN reaction causing immediate rash, facial/tongue/throat swelling, SOB or lightheadedness with hypotension: unknown Has patient had a PCN reaction causing severe rash involving mucus membranes or skin necrosis: unknown Has patient had a PCN reaction that required hospitalization: unknown Has patient had a PCN reaction occurring within the last 10 years: no If all of the above answers are "NO", then may proceed with Cephalosporin use.    Physical exam: Unable to assess given encounter type.  Impression/Plan: 1. Stage IV, cT3, N0, M1c, non-small cell lung cancer, adenocarcinoma of the right lower lobe with progressive brain disease.  The patient's MRI was reviewed, fortunately radiographically and clinically he seems to be doing well from this and prior brain treatment and disease.  We discussed the rationale to continue to follow him closely in surveillance at 80-month intervals for the first year after his treatment and then subsequently extending those intervals provided that he stays stable.  He will continue with Dr. Julien Nordmann and with Beryle Flock, he is aware of contacting Dr. Julien Nordmann if any of his symptoms that he feels are due to his Beryle Flock progress prior to being seen.  Otherwise we will see him back after his next scan to review these results and continue to follow him in surveillance.   Given current concerns for patient exposure during the COVID-19 pandemic, this encounter was conducted via telephone.  The patient has provided two factor identification and has given verbal consent for this type of encounter and has been advised to only accept a meeting of this type in a secure network environment. The time spent during this encounter was  30 minutes including preparation, discussion, and coordination of the patient's care. The attendants for this meeting include Hayden Pedro  and LEKENDRICK ALPERN and his wife Doniel Maiello.   Hayden Pedro was located at [facility] Radiation Oncology Department.  Armanii Pressnell and his wife Vaughan Basta were located at home    Carola Rhine, [facility]

## 2020-09-18 ENCOUNTER — Ambulatory Visit (HOSPITAL_COMMUNITY): Payer: 59

## 2020-09-22 ENCOUNTER — Other Ambulatory Visit: Payer: Self-pay

## 2020-09-22 ENCOUNTER — Encounter: Payer: Self-pay | Admitting: Internal Medicine

## 2020-09-22 ENCOUNTER — Inpatient Hospital Stay: Payer: Managed Care, Other (non HMO)

## 2020-09-22 ENCOUNTER — Inpatient Hospital Stay (HOSPITAL_BASED_OUTPATIENT_CLINIC_OR_DEPARTMENT_OTHER): Payer: Managed Care, Other (non HMO) | Admitting: Internal Medicine

## 2020-09-22 VITALS — BP 138/75 | HR 78 | Temp 96.5°F | Resp 20 | Ht 68.0 in | Wt 154.6 lb

## 2020-09-22 DIAGNOSIS — I10 Essential (primary) hypertension: Secondary | ICD-10-CM

## 2020-09-22 DIAGNOSIS — C3491 Malignant neoplasm of unspecified part of right bronchus or lung: Secondary | ICD-10-CM

## 2020-09-22 DIAGNOSIS — Z95828 Presence of other vascular implants and grafts: Secondary | ICD-10-CM

## 2020-09-22 DIAGNOSIS — C7931 Secondary malignant neoplasm of brain: Secondary | ICD-10-CM | POA: Diagnosis not present

## 2020-09-22 DIAGNOSIS — Z5112 Encounter for antineoplastic immunotherapy: Secondary | ICD-10-CM

## 2020-09-22 DIAGNOSIS — C3431 Malignant neoplasm of lower lobe, right bronchus or lung: Secondary | ICD-10-CM | POA: Diagnosis not present

## 2020-09-22 LAB — CMP (CANCER CENTER ONLY)
ALT: 15 U/L (ref 0–44)
AST: 22 U/L (ref 15–41)
Albumin: 3 g/dL — ABNORMAL LOW (ref 3.5–5.0)
Alkaline Phosphatase: 77 U/L (ref 38–126)
Anion gap: 12 (ref 5–15)
BUN: 9 mg/dL (ref 6–20)
CO2: 25 mmol/L (ref 22–32)
Calcium: 8.8 mg/dL — ABNORMAL LOW (ref 8.9–10.3)
Chloride: 101 mmol/L (ref 98–111)
Creatinine: 0.81 mg/dL (ref 0.61–1.24)
GFR, Estimated: >60 mL/min (ref >60)
Glucose, Bld: 110 mg/dL — ABNORMAL HIGH (ref 70–99)
Potassium: 3.3 mmol/L — ABNORMAL LOW (ref 3.5–5.1)
Sodium: 138 mmol/L (ref 135–145)
Total Bilirubin: 0.5 mg/dL (ref 0.3–1.2)
Total Protein: 6.4 g/dL — ABNORMAL LOW (ref 6.5–8.1)

## 2020-09-22 LAB — CBC WITH DIFFERENTIAL (CANCER CENTER ONLY)
Abs Immature Granulocytes: 0.04 10*3/uL (ref 0.00–0.07)
Basophils Absolute: 0.1 10*3/uL (ref 0.0–0.1)
Basophils Relative: 1 %
Eosinophils Absolute: 0.1 10*3/uL (ref 0.0–0.5)
Eosinophils Relative: 1 %
HCT: 41.9 % (ref 39.0–52.0)
Hemoglobin: 14 g/dL (ref 13.0–17.0)
Immature Granulocytes: 0 %
Lymphocytes Relative: 17 %
Lymphs Abs: 1.8 10*3/uL (ref 0.7–4.0)
MCH: 29.5 pg (ref 26.0–34.0)
MCHC: 33.4 g/dL (ref 30.0–36.0)
MCV: 88.2 fL (ref 80.0–100.0)
Monocytes Absolute: 1 10*3/uL (ref 0.1–1.0)
Monocytes Relative: 9 %
Neutro Abs: 7.3 10*3/uL (ref 1.7–7.7)
Neutrophils Relative %: 72 %
Platelet Count: 245 10*3/uL (ref 150–400)
RBC: 4.75 MIL/uL (ref 4.22–5.81)
RDW: 15.2 % (ref 11.5–15.5)
WBC Count: 10.3 10*3/uL (ref 4.0–10.5)
nRBC: 0 % (ref 0.0–0.2)

## 2020-09-22 LAB — TSH: TSH: 3.895 u[IU]/mL (ref 0.320–4.118)

## 2020-09-22 MED ORDER — SODIUM CHLORIDE 0.9% FLUSH
10.0000 mL | INTRAVENOUS | Status: DC | PRN
  Administered 2020-09-22: 10 mL
  Filled 2020-09-22: qty 10

## 2020-09-22 MED ORDER — HEPARIN SOD (PORK) LOCK FLUSH 100 UNIT/ML IV SOLN
500.0000 [IU] | Freq: Once | INTRAVENOUS | Status: AC | PRN
  Administered 2020-09-22: 500 [IU]
  Filled 2020-09-22: qty 5

## 2020-09-22 MED ORDER — SODIUM CHLORIDE 0.9 % IV SOLN
200.0000 mg | Freq: Once | INTRAVENOUS | Status: AC
  Administered 2020-09-22: 200 mg via INTRAVENOUS
  Filled 2020-09-22: qty 8

## 2020-09-22 MED ORDER — SODIUM CHLORIDE 0.9% FLUSH
10.0000 mL | Freq: Once | INTRAVENOUS | Status: AC
  Administered 2020-09-22: 10 mL
  Filled 2020-09-22: qty 10

## 2020-09-22 MED ORDER — SODIUM CHLORIDE 0.9 % IV SOLN
Freq: Once | INTRAVENOUS | Status: AC
Start: 2020-09-22 — End: 2020-09-22
  Filled 2020-09-22: qty 250

## 2020-09-22 NOTE — Progress Notes (Signed)
Joseph [phone]   [phone]  OFFICE PROGRESS NOTE  [name], NP [address] [name] [address] [place] [address]  DIAGNOSIS: Stage IV (T3, N0, M1C) non-small cell lung cancer, adenocarcinoma.The patient presented with a right lower lobe/infrahilar mass as well as a solitary brain metastasis in the left cerebellum. He was diagnosed in July 2021.  Molecular Biomarkers:  MSI-High DETECTED Pembrolizumab Atezolizumab, Avelumab, Cemiplimab, Dostarlimab, Durvalumab, Ipilimumab, Nivolumab  STK11Splice Site SNV 9.4% Everolimus, Temsirolimus Yes  [id number] 1.7% Binimetinib Yes  [id number] 0.4%  Niraparib, Olaparib, Rucaparib, Talazoparib, Tazemetostat Yes  PRIOR THERAPY:  1) SRS to the solitary brain metastasis under the care of Dr. Lisbeth Renshaw. Last treatment 11/14/19. 2) Weekly concurrent chemoradiation with carboplatin for an AUC of 2, paclitaxel 45 mg/m2.First dose expected on 11/25/2019. Status post 7 cycles, last dose was giving 01/06/2020 with partial response.   CURRENT THERAPY:  1)  Immunotherapy with Keytruda 200 mg IV every 3 weeks.  First dose February 10, 2020 for a patient with MSI high.  Status post 9 cycles. 2) Avastin 15 mg/KG every 3 weeks.  First dose today for the vasogenic edema of the brain.S/P 7 cycles.  INTERVAL HISTORY: Joseph Atkins 60 y.o. male returns to the clinic today for follow-up visit accompanied by his wife.  The patient is feeling fine today with no concerning complaints except for the weakness in the lower extremities more on the right side.  He denied having any chest pain, shortness of breath, cough or hemoptysis.  He denied having any fever or chills.  He has no nausea, vomiting, diarrhea or constipation.  He lost around 10 pounds secondary to lack of appetite.  He had MRI of the brain performed last week that showed a stable disease.  The patient was supposed to have repeat CT scan  of the chest, abdomen and pelvis before this visit but unfortunately it scheduled to be done on September 25, 2020.  The patient is here today for evaluation before starting cycle #10 of his treatment with Keytruda.   MEDICAL HISTORY: Past Medical History:  Diagnosis Date  . Cancer ([facility])    lung cancer  . Diverticulosis   . GERD (gastroesophageal reflux disease)   . Hx of small bowel obstruction   . Hyperlipidemia   . Hypertension   . Hypothyroidism   . IBS (irritable bowel syndrome)   . Substance abuse ([facility])    Alcoholic, Drug addition  . Thyroid disease     ALLERGIES:  is allergic to penicillins.  MEDICATIONS:  Current Outpatient Medications  Medication Sig Dispense Refill  . albuterol (PROVENTIL HFA;VENTOLIN HFA) 108 (90 Base) MCG/ACT inhaler Inhale 2 puffs into the lungs every 6 (six) hours as needed for wheezing or shortness of breath. 1 Inhaler 0  . ANORO ELLIPTA 62.5-25 MCG/INH AEPB 1 puff daily.    . cyclobenzaprine (FLEXERIL) 10 MG tablet Take 10 mg by mouth 2 (two) times daily as needed for muscle spasms. (Patient not taking: No sig reported)    . docusate sodium (COLACE) 100 MG capsule Take 100 mg by mouth daily.    . DULoxetine (CYMBALTA) 20 MG capsule Take 1 capsule (20 mg total) by mouth 2 (two) times daily. 60 capsule 5  . fluticasone furoate-vilanterol (BREO ELLIPTA) 100-25 MCG/INH AEPB Inhale 1 puff into the lungs daily. 30 each 3  . levothyroxine (SYNTHROID) 100 MCG tablet Take 1 tablet (100 mcg total) by mouth daily. 30 tablet 3  .  lidocaine (XYLOCAINE) 2 % solution Use as directed 15 mLs in the mouth or throat every 6 (six) hours as needed for mouth pain. Do not eat/drink within 60 minutes of taking this medication (Patient not taking: No sig reported) 100 mL 0  . lidocaine-prilocaine (EMLA) cream Apply 1 application topically as needed. (Patient not taking: Reported on 09/10/2020) 30 g 0  . omeprazole (PRILOSEC) 40 MG capsule Take 1 capsule (40 mg total) by mouth  daily. 90 capsule 4  . ondansetron (ZOFRAN) 4 MG tablet Take 1 tablet (4 mg total) by mouth every 8 (eight) hours as needed. 40 tablet 2  . oxyCODONE-acetaminophen (PERCOCET/ROXICET) 5-325 MG tablet Take 1 tablet by mouth every 4 (four) hours as needed for severe pain. (Patient not taking: No sig reported) 30 tablet 0  . polyethylene glycol powder (MIRALAX) powder Take 17 g by mouth daily. 255 g 11  . potassium chloride SA (KLOR-CON) 20 MEQ tablet Take 1 tablet (20 mEq total) by mouth daily. 10 tablet 0  . prochlorperazine (COMPAZINE) 10 MG tablet Take 1 tablet (10 mg total) by mouth every 6 (six) hours as needed. 30 tablet 2  . sucralfate (CARAFATE) 1 g tablet Take 1 tablet (1 g total) by mouth 4 (four) times daily. Dissolve each tablet in 15 cc water before use. (Patient not taking: No sig reported) 120 tablet 2  . TRULANCE 3 MG TABS Take 1 tablet by mouth daily.    Marland Kitchen zolpidem (AMBIEN CR) 12.5 MG CR tablet Take 1 tablet (12.5 mg total) by mouth at bedtime as needed for sleep. (Patient not taking: No sig reported) 30 tablet 5   No current facility-administered medications for this visit.    SURGICAL HISTORY:  Past Surgical History:  Procedure Laterality Date  . arm surgery Right   . BRONCHIAL BRUSHINGS  10/24/2019   Procedure: BRONCHIAL BRUSHINGS;  Surgeon: Collene Gobble, MD;  Location: [facility] ENDOSCOPY;  Service: Cardiopulmonary;;  right lower lobe   . BRONCHIAL BRUSHINGS  11/05/2019   Procedure: BRONCHIAL BRUSHINGS;  Surgeon: Collene Gobble, MD;  Location: [facility] ENDOSCOPY;  Service: Pulmonary;;  . BRONCHIAL NEEDLE ASPIRATION BIOPSY  10/24/2019   Procedure: BRONCHIAL NEEDLE ASPIRATION BIOPSIES;  Surgeon: Collene Gobble, MD;  Location: [facility];  Service: Cardiopulmonary;;  . BRONCHIAL NEEDLE ASPIRATION BIOPSY  11/05/2019   Procedure: BRONCHIAL NEEDLE ASPIRATION BIOPSIES;  Surgeon: Collene Gobble, MD;  Location: [facility] ENDOSCOPY;  Service: Pulmonary;;  . ENDOBRONCHIAL ULTRASOUND N/A 10/24/2019    Procedure: ENDOBRONCHIAL ULTRASOUND;  Surgeon: Collene Gobble, MD;  Location: [facility];  Service: Cardiopulmonary;  Laterality: N/A;  . FINGER SURGERY Right    Middle  . IR IMAGING GUIDED PORT INSERTION  11/19/2019  . VIDEO BRONCHOSCOPY N/A 10/24/2019   Procedure: VIDEO BRONCHOSCOPY WITHOUT FLUORO;  Surgeon: Collene Gobble, MD;  Location: [facility] ENDOSCOPY;  Service: Cardiopulmonary;  Laterality: N/A;  . VIDEO BRONCHOSCOPY WITH ENDOBRONCHIAL NAVIGATION N/A 11/05/2019   Procedure: VIDEO BRONCHOSCOPY WITH ENDOBRONCHIAL NAVIGATION;  Surgeon: Collene Gobble, MD;  Location: [facility] ENDOSCOPY;  Service: Pulmonary;  Laterality: N/A;    REVIEW OF SYSTEMS:  A comprehensive review of systems was negative except for: Constitutional: positive for fatigue and weight loss Musculoskeletal: positive for arthralgias and muscle weakness   PHYSICAL EXAMINATION: General appearance: alert, cooperative, fatigued and no distress Head: Normocephalic, without obvious abnormality, atraumatic Neck: no adenopathy, no JVD, supple, symmetrical, trachea midline and thyroid not enlarged, symmetric, no tenderness/mass/nodules Lymph nodes: Cervical, supraclavicular, and axillary nodes normal. Resp: clear  to auscultation bilaterally Back: symmetric, no curvature. ROM normal. No CVA tenderness. Cardio: regular rate and rhythm, S1, S2 normal, no murmur, click, rub or gallop GI: soft, non-tender; bowel sounds normal; no masses,  no organomegaly Extremities: extremities normal, atraumatic, no cyanosis or edema  ECOG PERFORMANCE STATUS: 1 - Symptomatic but completely ambulatory  Blood pressure 138/75, pulse 78, temperature (!) 96.5 F (35.8 C), temperature source Tympanic, resp. rate 20, height $RemoveBe'5\' 8"'RcqodlPmg$  (1.727 m), weight 154 lb 9.6 oz (70.1 kg), SpO2 99 %.  LABORATORY DATA: Lab Results  Component Value Date   WBC 10.3 09/22/2020   HGB 14.0 09/22/2020   HCT 41.9 09/22/2020   MCV 88.2 09/22/2020   PLT 245 09/22/2020       Chemistry      Component Value Date/Time   NA 139 08/31/2020 0905   NA 141 03/08/2017 1707   K 2.8 (L) 08/31/2020 0905   CL 101 08/31/2020 0905   CO2 25 08/31/2020 0905   BUN <4 (L) 08/31/2020 0905   BUN 12 03/08/2017 1707   CREATININE 0.82 08/31/2020 0905      Component Value Date/Time   CALCIUM 8.6 (L) 08/31/2020 0905   ALKPHOS 78 08/31/2020 0905   AST 23 08/31/2020 0905   ALT 10 08/31/2020 0905   BILITOT 0.6 08/31/2020 0905       RADIOGRAPHIC STUDIES: MR Brain W Wo Contrast  Result Date: 09/11/2020 CLINICAL DATA:  Follow-up metastatic disease EXAM: MRI HEAD WITHOUT AND WITH CONTRAST TECHNIQUE: Multiplanar, multiecho pulse sequences of the brain and surrounding structures were obtained without and with intravenous contrast. CONTRAST:  60mL MULTIHANCE GADOBENATE DIMEGLUMINE 529 MG/ML IV SOLN COMPARISON:  06/05/2020 FINDINGS: Brain: No new or progressive disease. Dominant treated lesion in the left cerebellum continues to show peripheral T1 hyperintensity 4 contrast with no additional visible enhancement. Overall maximal axial diameter is 2.8 x 2.2 cm, compared with 3.1 x 2.5 cm previously, indicating slight contraction. No significant edema. Small metastasis previously seen in the right lateral cerebellum axial image 26 is no longer visible. Tiny right occipital lesion previously seen axial image 67 is no longer visible. Punctate focus of enhancement left medial occipital lobe axial image 69 is smaller. Right occipital lesion previously seen axial image 74 is no longer visible. Left frontal lesion axial image 88 previously seen is no longer visible. Right parietal lesion axial image 99 is no longer visible. Right lateral frontoparietal junction lesion axial image 125 is smaller, measuring 2.5 mm. Left posterior frontal lesion axial image 125 is no longer visible. Left frontoparietal vertex lesion axial image 139 is smaller. Mild chronic small-vessel ischemic changes appear the same. No  evidence of hydrocephalus or extra-axial collection. Vascular: Major vessels at the base of the brain show flow. Skull and upper cervical spine: Negative Sinuses/Orbits: Retention cyst right maxillary sinus as seen previously. Tiny left mastoid effusion as seen previously. Orbits negative. Other: None IMPRESSION: No new or progressive disease. Treated left cerebellar lesion is smaller, having contracted from 31 x 25 mm previously to a measurement of 28 x 22 mm today. Precontrast peripheral T1 hyperintensity without evidence of actual additional contrast enhancement. No edema. Many of the other previously described lesions are frankly invisible today. Tiny enhancing foci which have gotten smaller are noted in the left medial occipital lobe, right lateral frontoparietal junction region and left frontoparietal vertex. Electronically Signed   By: Nelson Chimes M.D.   On: 09/11/2020 12:38    ASSESSMENT AND PLAN: This is a very pleasant  60 years old white male with a stage IV (t3, N0, M1c) non-small cell lung cancer, adenocarcinoma with MSI high presented with right lower lobe/infrahilar mass in addition to solitary brain metastasis in the left cerebellum diagnosed in July 2021. He is status post SRS to the solitary brain metastasis. The patient completed a course of concurrent chemoradiation with weekly carboplatin and paclitaxel.  He tolerated the treatment well except for fatigue and mild odynophagia. He had repeat CT scan of the chest performed recently.  I personally and independently reviewed the scans and discussed the results with the patient and his wife. His scan showed interval decrease in the volume of the right infrahilar mass with no other evidence of metastatic disease. The patient has MSI high and I recommended for him treatment with immunotherapy with single agent Keytruda 200 mg IV every 3 weeks for a total of 2 years unless the patient has unacceptable toxicity or disease progression. He is  status post 9 cycles of treatment with Keytruda.  He also received 7 cycles of Avastin for the vasogenic edema in the brain.    Avastin will be on hold for now unless needed in the future. The patient continues to tolerate his treatment with [facility] fairly well. I recommended for him to proceed with cycle #10 today as planned. He will have repeat CT scan of the chest, abdomen pelvis performed later this week. I will see him back for follow-up visit for evaluation before starting cycle #11. For the metastatic brain lesions, he is followed by Dr. Mickeal Skinner and Dr. Lisbeth Renshaw.  He is scheduled to have MRI of the brain next month. For the lack of appetite, I strongly encouraged the patient to increase his oral intake and eat snacks in between meals. He was advised to call immediately if he has any other concerning symptoms in the interval. The patient voices understanding of current disease status and treatment options and is in agreement with the current care plan.  All questions were answered. The patient knows to call the clinic with any problems, questions or concerns. We can certainly see the patient much sooner if necessary.  Disclaimer: This note was dictated with voice recognition software. Similar sounding words can inadvertently be transcribed and may not be corrected upon review.

## 2020-09-22 NOTE — Patient Instructions (Signed)
[facility] ONCOLOGY  Discharge Instructions: Thank you for choosing [facility] to provide your oncology and hematology care.   If you have a lab appointment with the [facility], please go directly to the [facility] and check in at the registration area.   Wear comfortable clothing and clothing appropriate for easy access to any Portacath or PICC line.   We strive to give you quality time with your provider. You may need to reschedule your appointment if you arrive late (15 or more minutes).  Arriving late affects you and other patients whose appointments are after yours.  Also, if you miss three or more appointments without notifying the office, you may be dismissed from the clinic at the provider's discretion.      For prescription refill requests, have your pharmacy contact our office and allow 72 hours for refills to be completed.    Today you received the following chemotherapy and/or immunotherapy agents: pembrolizumab.      To help prevent nausea and vomiting after your treatment, we encourage you to take your nausea medication as directed.  BELOW ARE SYMPTOMS THAT SHOULD BE REPORTED IMMEDIATELY: . *FEVER GREATER THAN 100.4 F (38 C) OR HIGHER . *CHILLS OR SWEATING . *NAUSEA AND VOMITING THAT IS NOT CONTROLLED WITH YOUR NAUSEA MEDICATION . *UNUSUAL SHORTNESS OF BREATH . *UNUSUAL BRUISING OR BLEEDING . *URINARY PROBLEMS (pain or burning when urinating, or frequent urination) . *BOWEL PROBLEMS (unusual diarrhea, constipation, pain near the anus) . TENDERNESS IN MOUTH AND THROAT WITH OR WITHOUT PRESENCE OF ULCERS (sore throat, sores in mouth, or a toothache) . UNUSUAL RASH, SWELLING OR PAIN  . UNUSUAL VAGINAL DISCHARGE OR ITCHING   Items with * indicate a potential emergency and should be followed up as soon as possible or go to the Emergency Department if any problems should occur.  Please show the CHEMOTHERAPY ALERT CARD or IMMUNOTHERAPY  ALERT CARD at check-in to the Emergency Department and triage nurse.  Should you have questions after your visit or need to cancel or reschedule your appointment, please contact [facility]  Dept: [phone]  and follow the prompts.  Office hours are 8:00 a.m. to 4:30 p.m. Monday - Friday. Please note that voicemails left after 4:00 p.m. may not be returned until the following business day.  We are closed weekends and major holidays. You have access to a nurse at all times for urgent questions. Please call the main number to the clinic Dept: [phone] and follow the prompts.   For any non-urgent questions, you may also contact your provider using MyChart. We now offer e-Visits for anyone 81 and older to request care online for non-urgent symptoms. For details visit [email or web address].   Also download the MyChart app! Go to the app store, search "MyChart", open the app, select [facility], and log in with your MyChart username and password.  Due to Covid, a mask is required upon entering the hospital/clinic. If you do not have a mask, one will be given to you upon arrival. For doctor visits, patients may have 1 support person aged 63 or older with them. For treatment visits, patients cannot have anyone with them due to current Covid guidelines and our immunocompromised population.

## 2020-09-22 NOTE — Patient Instructions (Signed)
Steps to Quit Smoking Smoking tobacco is the leading cause of preventable death. It can affect almost every organ in the body. Smoking puts you and people around you at risk for many serious, long-lasting (chronic) diseases. Quitting smoking can be hard, but it is one of the best things that you can do for your health. It is never too late to quit. How do I get ready to quit? When you decide to quit smoking, make a plan to help you succeed. Before you quit:  Pick a date to quit. Set a date within the next 2 weeks to give you time to prepare.  Write down the reasons why you are quitting. Keep this list in places where you will see it often.  Tell your family, friends, and co-workers that you are quitting. Their support is important.  Talk with your doctor about the choices that may help you quit.  Find out if your health insurance will pay for these treatments.  Know the people, places, things, and activities that make you want to smoke (triggers). Avoid them. What first steps can I take to quit smoking?  Throw away all cigarettes at home, at work, and in your car.  Throw away the things that you use when you smoke, such as ashtrays and lighters.  Clean your car. Make sure to empty the ashtray.  Clean your home, including curtains and carpets. What can I do to help me quit smoking? Talk with your doctor about taking medicines and seeing a counselor at the same time. You are more likely to succeed when you do both.  If you are pregnant or breastfeeding, talk with your doctor about counseling or other ways to quit smoking. Do not take medicine to help you quit smoking unless your doctor tells you to do so. To quit smoking: Quit right away  Quit smoking totally, instead of slowly cutting back on how much you smoke over a period of time.  Go to counseling. You are more likely to quit if you go to counseling sessions regularly. Take medicine You may take medicines to help you quit. Some  medicines need a prescription, and some you can buy over-the-counter. Some medicines may contain a drug called nicotine to replace the nicotine in cigarettes. Medicines may:  Help you to stop having the desire to smoke (cravings).  Help to stop the problems that come when you stop smoking (withdrawal symptoms). Your doctor may ask you to use:  Nicotine patches, gum, or lozenges.  Nicotine inhalers or sprays.  Non-nicotine medicine that is taken by mouth. Find resources Find resources and other ways to help you quit smoking and remain smoke-free after you quit. These resources are most helpful when you use them often. They include:  Online chats with a counselor.  Phone quitlines.  Printed self-help materials.  Support groups or group counseling.  Text messaging programs.  Mobile phone apps. Use apps on your mobile phone or tablet that can help you stick to your quit plan. There are many free apps for mobile phones and tablets as well as websites. Examples include Quit Guide from the CDC and smokefree.gov   What things can I do to make it easier to quit?  Talk to your family and friends. Ask them to support and encourage you.  Call a phone quitline (1-800-QUIT-NOW), reach out to support groups, or work with a counselor.  Ask people who smoke to not smoke around you.  Avoid places that make you want to smoke,   such as: ? Bars. ? Parties. ? Smoke-break areas at work.  Spend time with people who do not smoke.  Lower the stress in your life. Stress can make you want to smoke. Try these things to help your stress: ? Getting regular exercise. ? Doing deep-breathing exercises. ? Doing yoga. ? Meditating. ? Doing a body scan. To do this, close your eyes, focus on one area of your body at a time from head to toe. Notice which parts of your body are tense. Try to relax the muscles in those areas.   How will I feel when I quit smoking? Day 1 to 3 weeks Within the first 24 hours,  you may start to have some problems that come from quitting tobacco. These problems are very bad 2-3 days after you quit, but they do not often last for more than 2-3 weeks. You may get these symptoms:  Mood swings.  Feeling restless, nervous, angry, or annoyed.  Trouble concentrating.  Dizziness.  Strong desire for high-sugar foods and nicotine.  Weight gain.  Trouble pooping (constipation).  Feeling like you may vomit (nausea).  Coughing or a sore throat.  Changes in how the medicines that you take for other issues work in your body.  Depression.  Trouble sleeping (insomnia). Week 3 and afterward After the first 2-3 weeks of quitting, you may start to notice more positive results, such as:  Better sense of smell and taste.  Less coughing and sore throat.  Slower heart rate.  Lower blood pressure.  Clearer skin.  Better breathing.  Fewer sick days. Quitting smoking can be hard. Do not give up if you fail the first time. Some people need to try a few times before they succeed. Do your best to stick to your quit plan, and talk with your doctor if you have any questions or concerns. Summary  Smoking tobacco is the leading cause of preventable death. Quitting smoking can be hard, but it is one of the best things that you can do for your health.  When you decide to quit smoking, make a plan to help you succeed.  Quit smoking right away, not slowly over a period of time.  When you start quitting, seek help from your doctor, family, or friends. This information is not intended to replace advice given to you by your health care provider. Make sure you discuss any questions you have with your health care provider. Document Revised: 01/04/2019 Document Reviewed: 06/30/2018 [employer] Patient Education  2021 [employer]  

## 2020-09-23 ENCOUNTER — Other Ambulatory Visit: Payer: Self-pay | Admitting: Radiation Therapy

## 2020-09-23 DIAGNOSIS — C7931 Secondary malignant neoplasm of brain: Secondary | ICD-10-CM

## 2020-09-24 ENCOUNTER — Telehealth: Payer: Self-pay | Admitting: Internal Medicine

## 2020-09-24 ENCOUNTER — Other Ambulatory Visit: Payer: Self-pay | Admitting: Radiation Therapy

## 2020-09-24 DIAGNOSIS — C7931 Secondary malignant neoplasm of brain: Secondary | ICD-10-CM

## 2020-09-24 NOTE — Telephone Encounter (Signed)
Scheduled per los. Called and spoke with patient. He will have his wife call back If the appts do not work out

## 2020-09-24 NOTE — Progress Notes (Signed)
Order entered for port access and de-access for August brain MRI at [facility].   Mont Dutton R.T.(R)(T) Radiation Special Procedures Navigator

## 2020-09-25 ENCOUNTER — Other Ambulatory Visit: Payer: Self-pay

## 2020-09-25 ENCOUNTER — Encounter (HOSPITAL_COMMUNITY): Payer: Self-pay

## 2020-09-25 ENCOUNTER — Ambulatory Visit (HOSPITAL_COMMUNITY)
Admission: RE | Admit: 2020-09-25 | Discharge: 2020-09-25 | Disposition: A | Discharge status: Home / Self Care | Payer: 59 | Source: Ambulatory Visit | Attending: Internal Medicine | Admitting: Internal Medicine

## 2020-09-25 DIAGNOSIS — C3490 Malignant neoplasm of unspecified part of unspecified bronchus or lung: Secondary | ICD-10-CM | POA: Insufficient documentation

## 2020-09-25 IMAGING — CT CT ABD-PELV W/ CM
2 of 3 series · 13 of 34 positions shown, 16 images · IV contrast (omnipaque)
Comparison: Multiple exams, including [DATE]

CLINICAL DATA: Restaging non-small cell lung cancer. Ongoing
immunotherapy. Prior radiation therapy.

EXAM:
CT CHEST, ABDOMEN, AND PELVIS WITH CONTRAST
TECHNIQUE: Multidetector CT imaging of the chest, abdomen and pelvis was
performed following the standard protocol during bolus
administration of intravenous contrast.
CONTRAST:  100mL OMNIPAQUE IOHEXOL 300 MG/ML  SOLN

[Series 2: cap with · axial · 0.75mm/px · z∈[-610,-60]mm · 10 of 133 slices shown, 13 images]
[im 12/133  mediastinal]
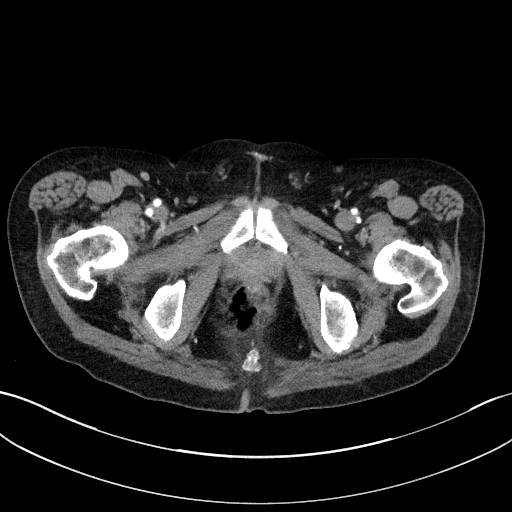
[im 12/133  lung]
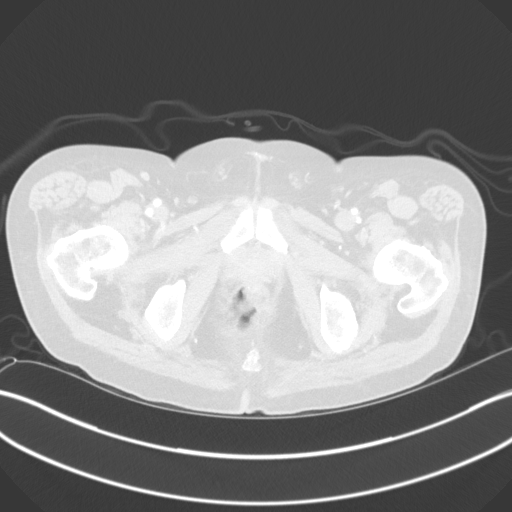
[im 23/133  lung]
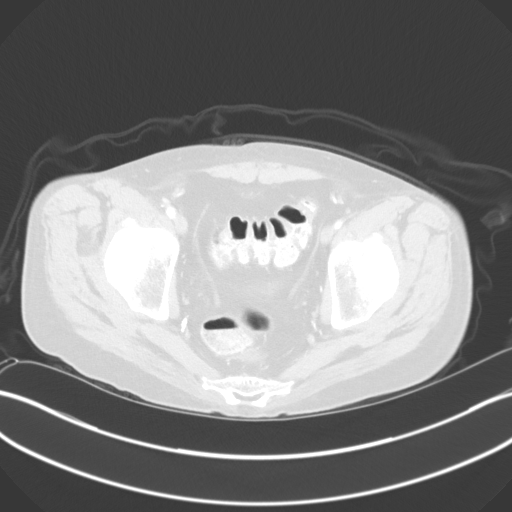
[im 34/133  lung]
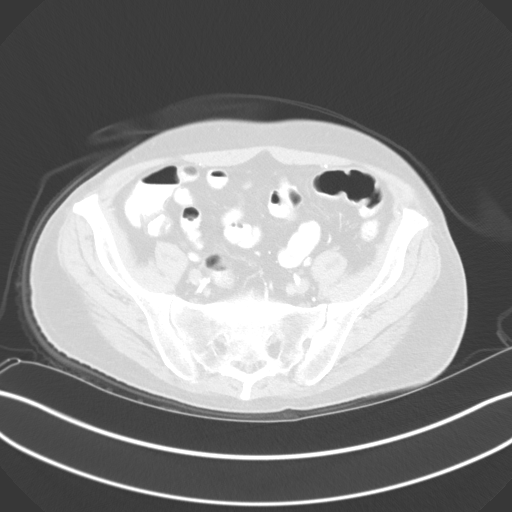
[im 45/133  lung]
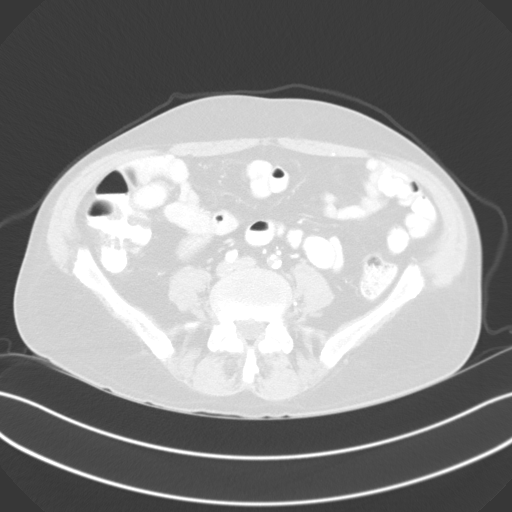
[im 56/133  mediastinal]
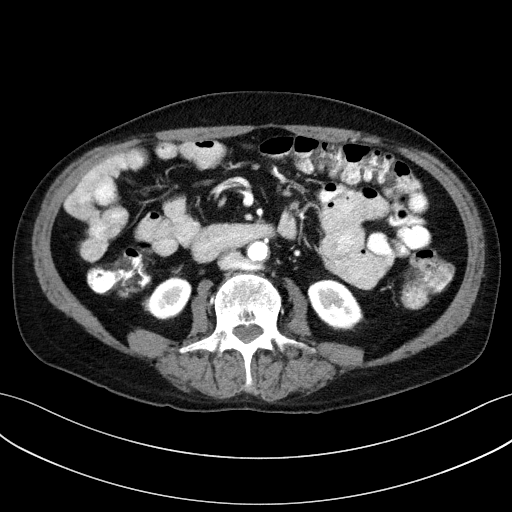
[im 56/133  lung]
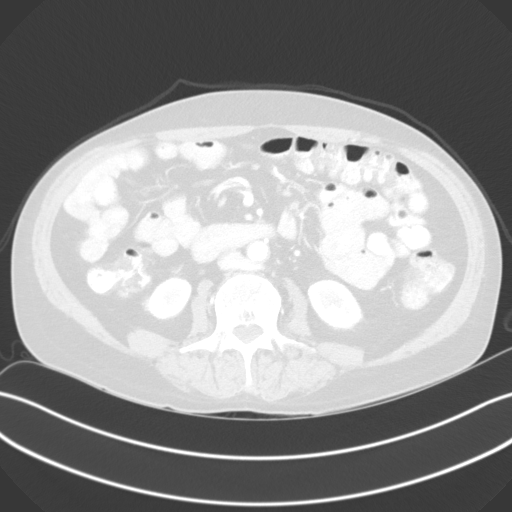
[im 78/133  lung]
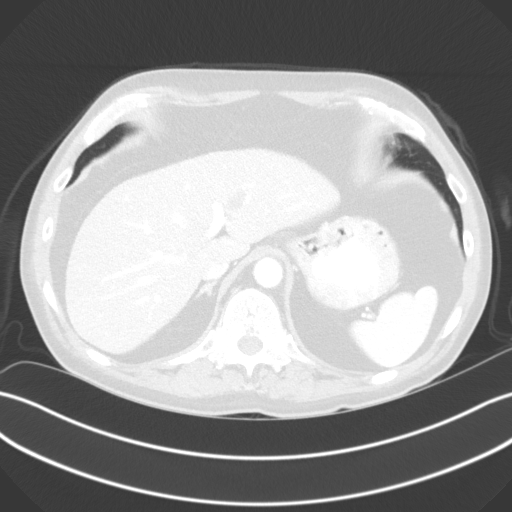
[im 89/133  lung]
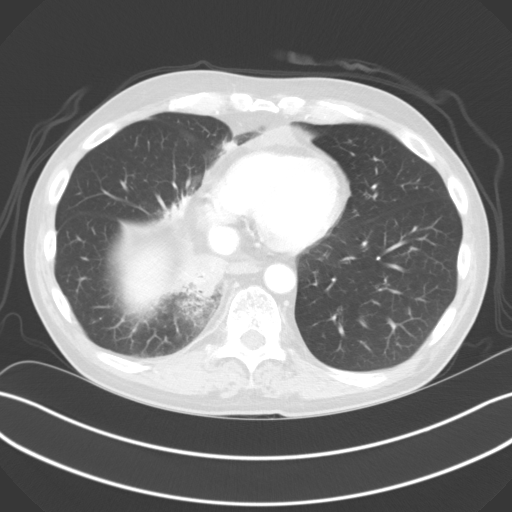
[im 100/133  lung]
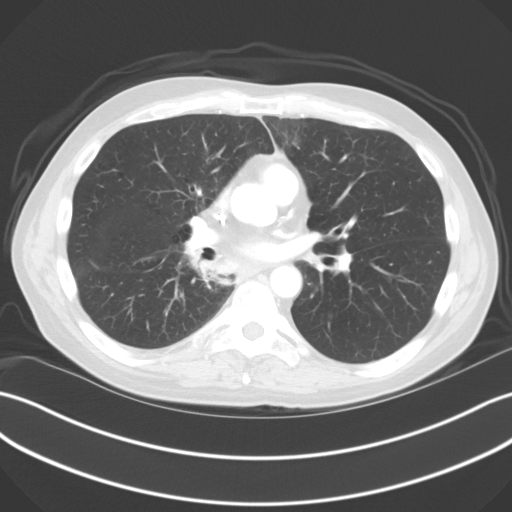
[im 111/133  mediastinal]
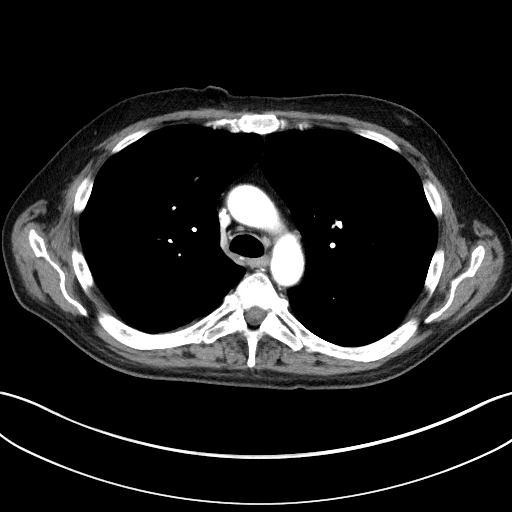
[im 111/133  lung]
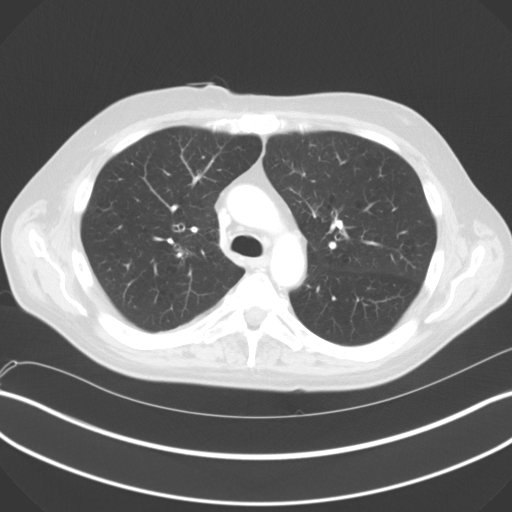
[im 122/133  lung]
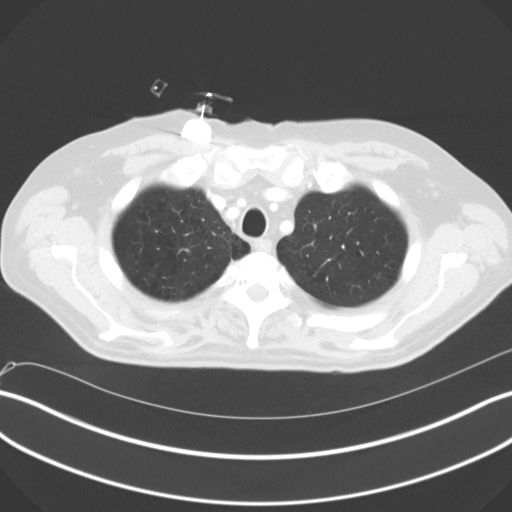

[Series 4: coronals · coronal · 0.85mm/px · 3 of 150 slices shown]
[im 30/150  lung]
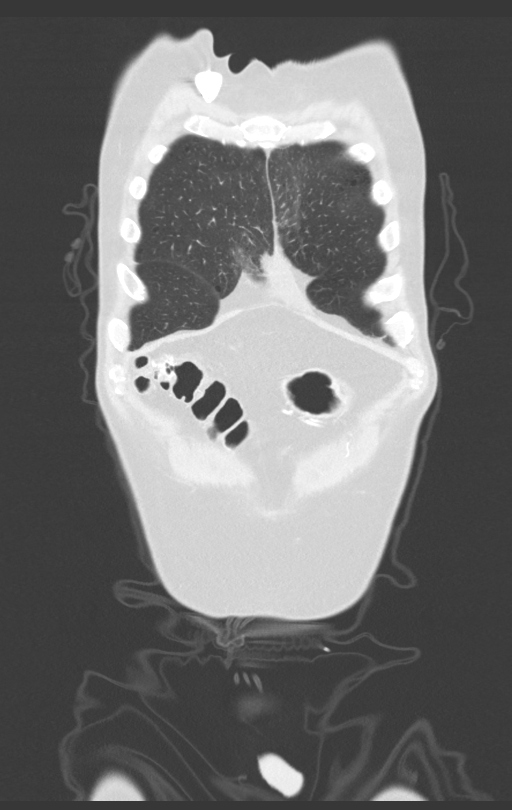
[im 60/150  lung]
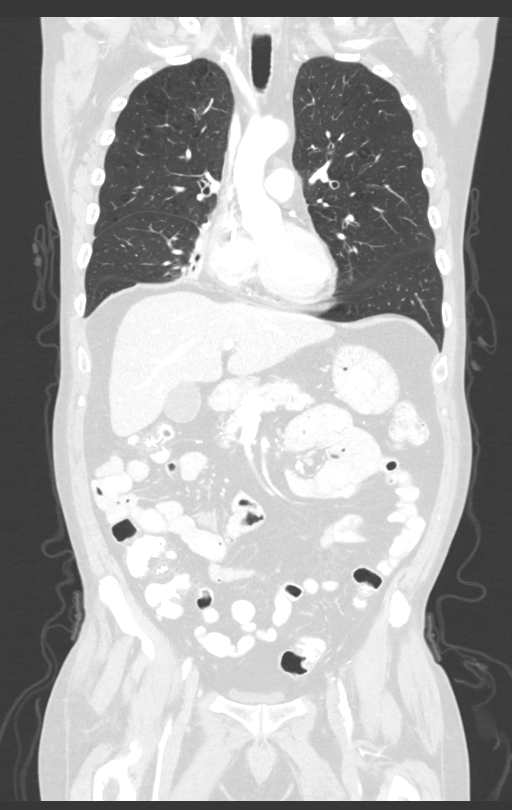
[im 90/150  lung]
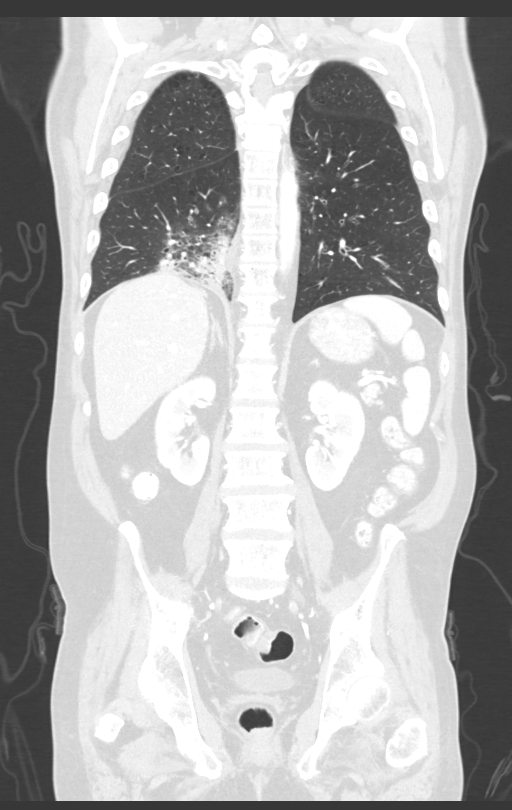

[13 of 34 positions shown; findings below may reference images not displayed]

FINDINGS: CT CHEST FINDINGS

Cardiovascular: Right Port-A-Cath tip: Cavoatrial junction.

Left anterior descending coronary artery atherosclerotic
calcification.

Stable small anterior pericardial effusion.

Mediastinum/Nodes: Unremarkable

Lungs/Pleura: Centrilobular emphysema.

Progressive right infrahilar and right paramediastinal volume loss
and airspace opacity especially medially in the right lower lobe
noted for example on image 106 of series 6. This surrounds an
obscures the region of the prior right lower lobe nodule and is
likely attributable to radiation pneumonitis. It is difficult to
separately identify the nodule within the consolidation although
there is a slightly hypodense 2.8 by 1.7 cm region in the vicinity
of the previous nodule, prior measurement was 2.8 by 2.0 cm.

Airway thickening is present, suggesting bronchitis or reactive
airways disease. The small left lower lobe nodule seen previously
has essentially resolved. The small right upper lobe nodule seen
previously has essentially resolved.

Musculoskeletal: Hemangioma eccentric to the right at the T3
vertebral level.

CT ABDOMEN PELVIS FINDINGS

Hepatobiliary: Hypodense 1.7 by 1.1 cm lesion in the left hepatic
lobe on image 56 series 2, similar in appearance to the [DATE]
exam but without hypermetabolic activity in this vicinity on the
interval PET-CT of [DATE], and not appreciably changed from
[Z4], compatible with a benign process. No worrisome liver lesion
identified. The gallbladder appears normal.

Pancreas: Unremarkable

Spleen: Unremarkable

Adrenals/Urinary Tract: 0.7 cm hypodense lesion of the left kidney
lower pole is stable and likely a small benign cyst although
technically nonspecific. The kidneys appear otherwise unremarkable.
The adrenal glands appear normal. Urinary bladder unremarkable.

Stomach/Bowel: Unremarkable

Vascular/Lymphatic: Aortoiliac atherosclerotic vascular disease.
Chronic occlusion of the left common iliac artery with
reconstitution of the left external iliac artery primarily via the
left inferior epigastric artery.

Reproductive: Unremarkable

Other: No supplemental non-categorized findings.

Musculoskeletal: Unremarkable
IMPRESSION: 1. Progressive right infrahilar and paramediastinal consolidation
compared to prior exams compatible with radiation pneumonitis.
Within this consolidation there is some faint hypodensity probably
corresponding to the original right lower lobe nodule and minimally
reduced in size compared to the nodule size on [DATE].
2. Reduce conspicuity of the prior small nodules in the left lower
lobe and right upper lobe. These have essentially resolved.
3. Other imaging findings of potential clinical significance: Aortic
Atherosclerosis ([Z4]-[Z4]). Left anterior descending coronary
artery atherosclerosis. Stable small anterior pericardial effusion.
Emphysema ([Z4]-[Z4]). Airway thickening is present, suggesting
bronchitis or reactive airways disease. Benign-appearing hypodense
lesion of the left hepatic lobe. Chronic occlusion of the left
common iliac artery.

## 2020-09-25 IMAGING — CT CT CHEST W/ CM
2 of 5 series · 12 of 36 positions shown, 15 images · IV contrast (OMNIPAQUE)
Comparison: Multiple exams, including [DATE]

CLINICAL DATA: Restaging non-small cell lung cancer. Ongoing
immunotherapy. Prior radiation therapy.

EXAM:
CT CHEST, ABDOMEN, AND PELVIS WITH CONTRAST
TECHNIQUE: Multidetector CT imaging of the chest, abdomen and pelvis was
performed following the standard protocol during bolus
administration of intravenous contrast.
CONTRAST:  100mL OMNIPAQUE IOHEXOL 300 MG/ML  SOLN

[Series 2: cap with · axial · 0.75mm/px · z∈[-600,-75]mm · 9 of 133 slices shown, 12 images]
[im 14/133  mediastinal]
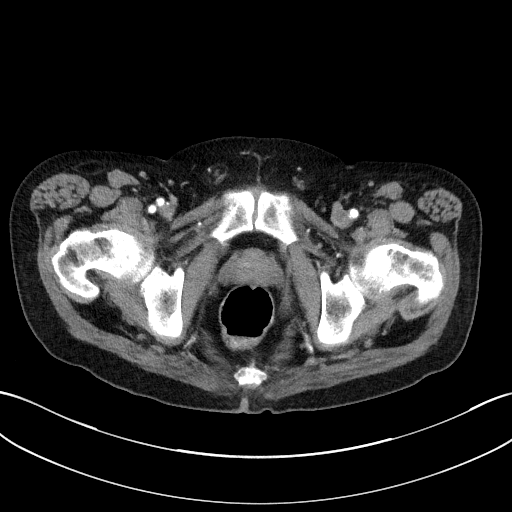
[im 14/133  lung]
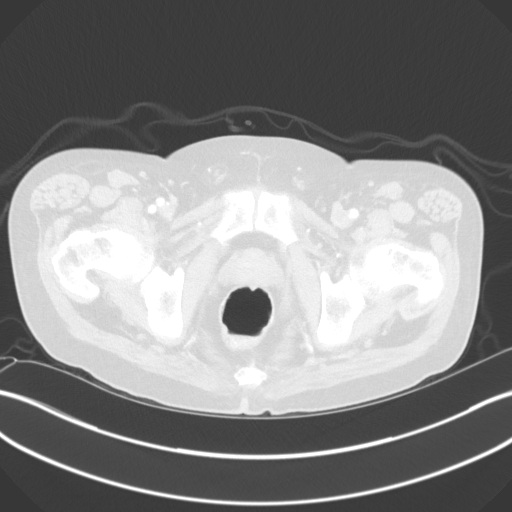
[im 27/133  lung]
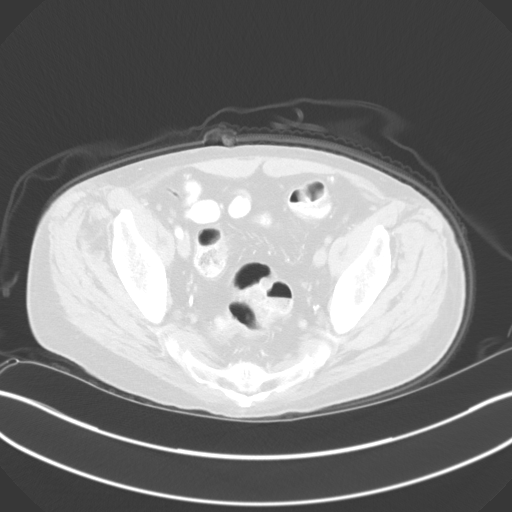
[im 40/133  lung]
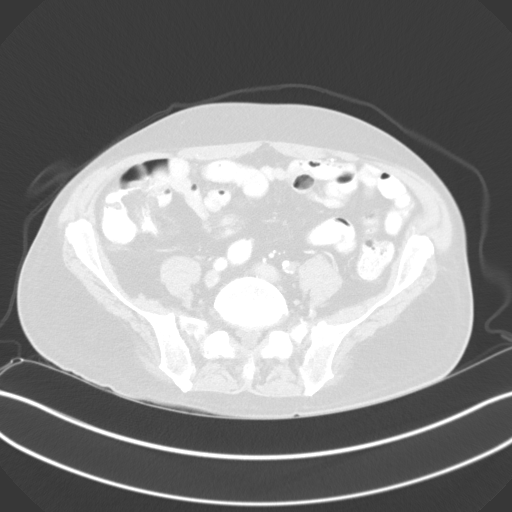
[im 53/133  lung]
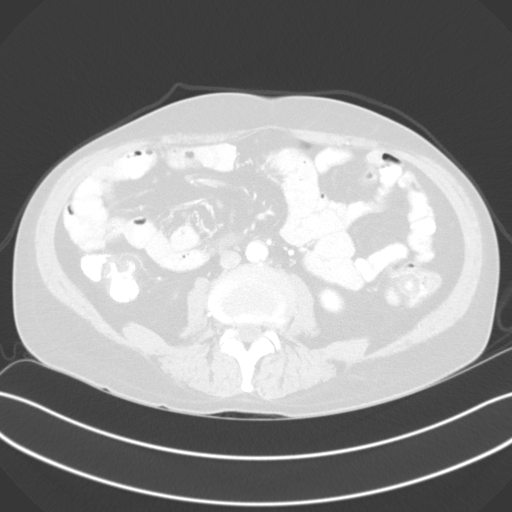
[im 67/133  mediastinal]
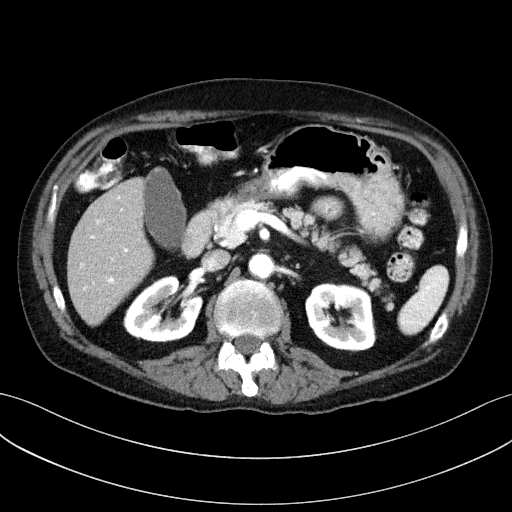
[im 67/133  lung]
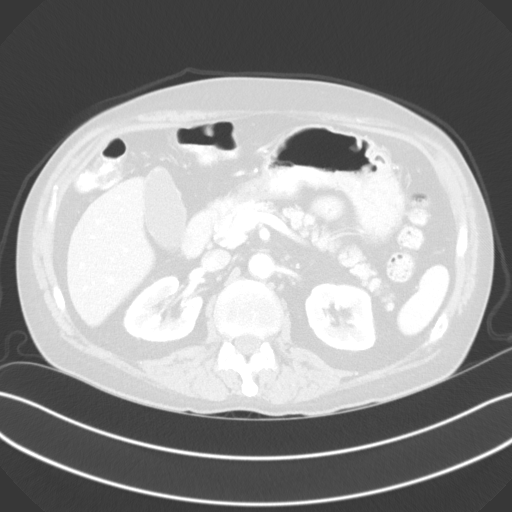
[im 80/133  lung]
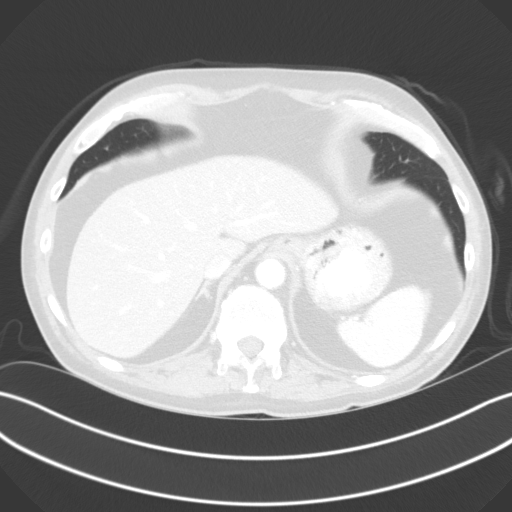
[im 93/133  lung]
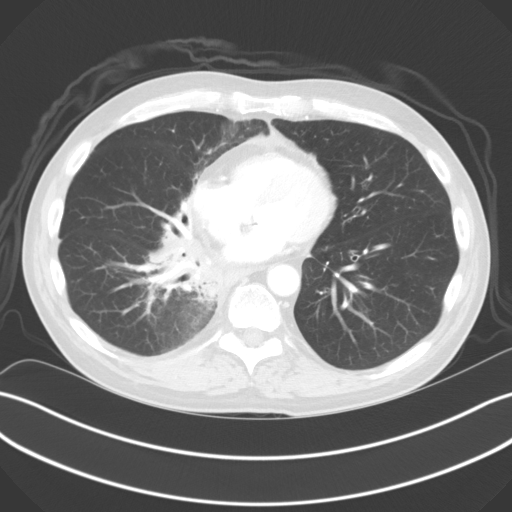
[im 106/133  lung]
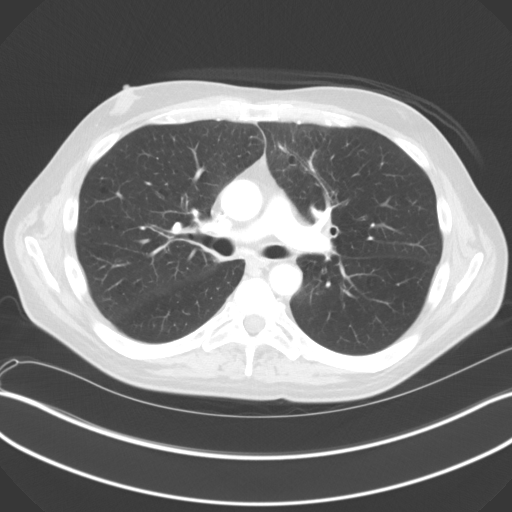
[im 119/133  mediastinal]
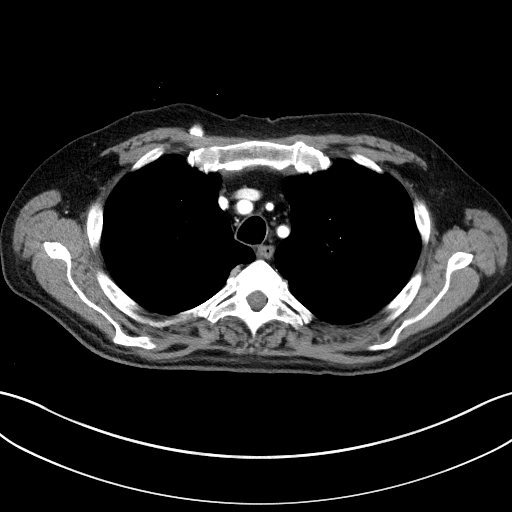
[im 119/133  lung]
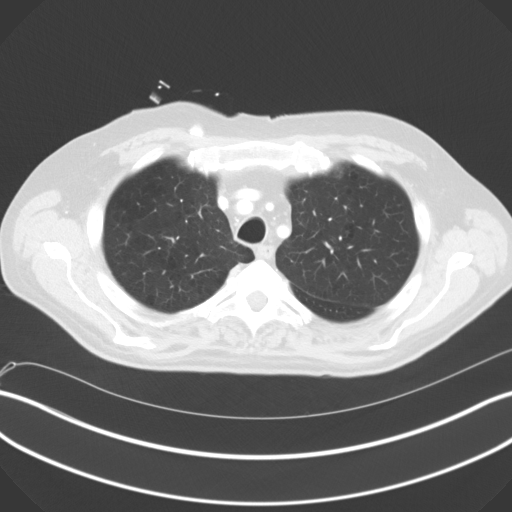

[Series 4: coronals · coronal · 0.85mm/px · 3 of 150 slices shown]
[im 30/150  lung]
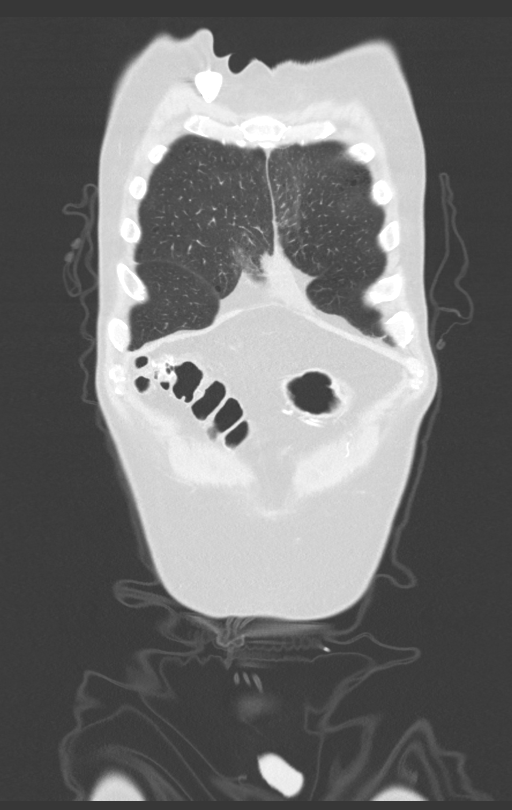
[im 60/150  lung]
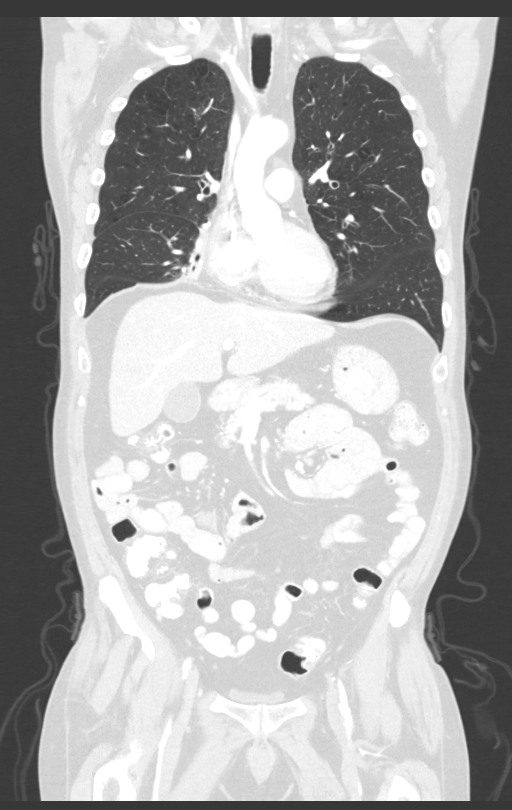
[im 90/150  lung]
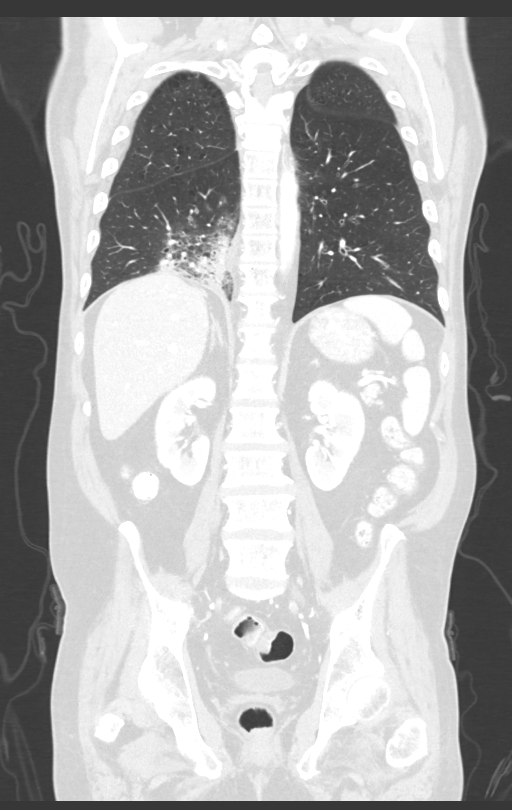

[12 of 36 positions shown; findings below may reference images not displayed]

FINDINGS: CT CHEST FINDINGS

Cardiovascular: Right Port-A-Cath tip: Cavoatrial junction.

Left anterior descending coronary artery atherosclerotic
calcification.

Stable small anterior pericardial effusion.

Mediastinum/Nodes: Unremarkable

Lungs/Pleura: Centrilobular emphysema.

Progressive right infrahilar and right paramediastinal volume loss
and airspace opacity especially medially in the right lower lobe
noted for example on image 106 of series 6. This surrounds an
obscures the region of the prior right lower lobe nodule and is
likely attributable to radiation pneumonitis. It is difficult to
separately identify the nodule within the consolidation although
there is a slightly hypodense 2.8 by 1.7 cm region in the vicinity
of the previous nodule, prior measurement was 2.8 by 2.0 cm.

Airway thickening is present, suggesting bronchitis or reactive
airways disease. The small left lower lobe nodule seen previously
has essentially resolved. The small right upper lobe nodule seen
previously has essentially resolved.

Musculoskeletal: Hemangioma eccentric to the right at the T3
vertebral level.

CT ABDOMEN PELVIS FINDINGS

Hepatobiliary: Hypodense 1.7 by 1.1 cm lesion in the left hepatic
lobe on image 56 series 2, similar in appearance to the [DATE]
exam but without hypermetabolic activity in this vicinity on the
interval PET-CT of [DATE], and not appreciably changed from
[Z4], compatible with a benign process. No worrisome liver lesion
identified. The gallbladder appears normal.

Pancreas: Unremarkable

Spleen: Unremarkable

Adrenals/Urinary Tract: 0.7 cm hypodense lesion of the left kidney
lower pole is stable and likely a small benign cyst although
technically nonspecific. The kidneys appear otherwise unremarkable.
The adrenal glands appear normal. Urinary bladder unremarkable.

Stomach/Bowel: Unremarkable

Vascular/Lymphatic: Aortoiliac atherosclerotic vascular disease.
Chronic occlusion of the left common iliac artery with
reconstitution of the left external iliac artery primarily via the
left inferior epigastric artery.

Reproductive: Unremarkable

Other: No supplemental non-categorized findings.

Musculoskeletal: Unremarkable
IMPRESSION: 1. Progressive right infrahilar and paramediastinal consolidation
compared to prior exams compatible with radiation pneumonitis.
Within this consolidation there is some faint hypodensity probably
corresponding to the original right lower lobe nodule and minimally
reduced in size compared to the nodule size on [DATE].
2. Reduce conspicuity of the prior small nodules in the left lower
lobe and right upper lobe. These have essentially resolved.
3. Other imaging findings of potential clinical significance: Aortic
Atherosclerosis ([Z4]-[Z4]). Left anterior descending coronary
artery atherosclerosis. Stable small anterior pericardial effusion.
Emphysema ([Z4]-[Z4]). Airway thickening is present, suggesting
bronchitis or reactive airways disease. Benign-appearing hypodense
lesion of the left hepatic lobe. Chronic occlusion of the left
common iliac artery.

## 2020-09-25 MED ORDER — IOHEXOL 300 MG/ML  SOLN
100.0000 mL | Freq: Once | INTRAMUSCULAR | Status: AC | PRN
  Administered 2020-09-25: 100 mL via INTRAVENOUS

## 2020-09-25 MED ORDER — HEPARIN SOD (PORK) LOCK FLUSH 100 UNIT/ML IV SOLN
INTRAVENOUS | Status: AC
  Filled 2020-09-25: qty 5

## 2020-09-25 MED ORDER — SODIUM CHLORIDE (PF) 0.9 % IJ SOLN
INTRAMUSCULAR | Status: AC
  Filled 2020-09-25: qty 50

## 2020-09-25 MED ORDER — HEPARIN SOD (PORK) LOCK FLUSH 100 UNIT/ML IV SOLN
500.0000 [IU] | Freq: Once | INTRAVENOUS | Status: AC
  Administered 2020-09-25: 500 [IU] via INTRAVENOUS

## 2020-10-12 ENCOUNTER — Other Ambulatory Visit: Payer: Self-pay | Admitting: Internal Medicine

## 2020-10-13 ENCOUNTER — Inpatient Hospital Stay: Payer: 59

## 2020-10-13 ENCOUNTER — Inpatient Hospital Stay: Payer: 59 | Attending: Internal Medicine

## 2020-10-13 ENCOUNTER — Other Ambulatory Visit: Payer: Self-pay

## 2020-10-13 ENCOUNTER — Inpatient Hospital Stay (HOSPITAL_BASED_OUTPATIENT_CLINIC_OR_DEPARTMENT_OTHER): Payer: 59 | Admitting: Internal Medicine

## 2020-10-13 VITALS — BP 136/89 | HR 72 | Temp 97.0°F | Resp 20 | Wt 148.0 lb

## 2020-10-13 DIAGNOSIS — K589 Irritable bowel syndrome without diarrhea: Secondary | ICD-10-CM | POA: Insufficient documentation

## 2020-10-13 DIAGNOSIS — I313 Pericardial effusion (noninflammatory): Secondary | ICD-10-CM | POA: Insufficient documentation

## 2020-10-13 DIAGNOSIS — Z5112 Encounter for antineoplastic immunotherapy: Secondary | ICD-10-CM

## 2020-10-13 DIAGNOSIS — C3491 Malignant neoplasm of unspecified part of right bronchus or lung: Secondary | ICD-10-CM

## 2020-10-13 DIAGNOSIS — K219 Gastro-esophageal reflux disease without esophagitis: Secondary | ICD-10-CM | POA: Diagnosis not present

## 2020-10-13 DIAGNOSIS — J432 Centrilobular emphysema: Secondary | ICD-10-CM | POA: Diagnosis not present

## 2020-10-13 DIAGNOSIS — G936 Cerebral edema: Secondary | ICD-10-CM | POA: Insufficient documentation

## 2020-10-13 DIAGNOSIS — I10 Essential (primary) hypertension: Secondary | ICD-10-CM | POA: Diagnosis not present

## 2020-10-13 DIAGNOSIS — E039 Hypothyroidism, unspecified: Secondary | ICD-10-CM | POA: Insufficient documentation

## 2020-10-13 DIAGNOSIS — F1010 Alcohol abuse, uncomplicated: Secondary | ICD-10-CM | POA: Diagnosis not present

## 2020-10-13 DIAGNOSIS — Z79899 Other long term (current) drug therapy: Secondary | ICD-10-CM | POA: Insufficient documentation

## 2020-10-13 DIAGNOSIS — I2510 Atherosclerotic heart disease of native coronary artery without angina pectoris: Secondary | ICD-10-CM | POA: Diagnosis not present

## 2020-10-13 DIAGNOSIS — Z923 Personal history of irradiation: Secondary | ICD-10-CM | POA: Insufficient documentation

## 2020-10-13 DIAGNOSIS — C3431 Malignant neoplasm of lower lobe, right bronchus or lung: Secondary | ICD-10-CM | POA: Diagnosis present

## 2020-10-13 DIAGNOSIS — C7931 Secondary malignant neoplasm of brain: Secondary | ICD-10-CM | POA: Diagnosis not present

## 2020-10-13 DIAGNOSIS — E785 Hyperlipidemia, unspecified: Secondary | ICD-10-CM | POA: Diagnosis not present

## 2020-10-13 DIAGNOSIS — Z95828 Presence of other vascular implants and grafts: Secondary | ICD-10-CM

## 2020-10-13 LAB — CMP (CANCER CENTER ONLY)
ALT: 19 U/L (ref 0–44)
AST: 28 U/L (ref 15–41)
Albumin: 2.7 g/dL — ABNORMAL LOW (ref 3.5–5.0)
Alkaline Phosphatase: 102 U/L (ref 38–126)
Anion gap: 11 (ref 5–15)
BUN: 7 mg/dL (ref 6–20)
CO2: 23 mmol/L (ref 22–32)
Calcium: 8.5 mg/dL — ABNORMAL LOW (ref 8.9–10.3)
Chloride: 102 mmol/L (ref 98–111)
Creatinine: 0.8 mg/dL (ref 0.61–1.24)
GFR, Estimated: >60 mL/min (ref >60)
Glucose, Bld: 91 mg/dL (ref 70–99)
Potassium: 3.9 mmol/L (ref 3.5–5.1)
Sodium: 136 mmol/L (ref 135–145)
Total Bilirubin: 1 mg/dL (ref 0.3–1.2)
Total Protein: 5.8 g/dL — ABNORMAL LOW (ref 6.5–8.1)

## 2020-10-13 LAB — CBC WITH DIFFERENTIAL (CANCER CENTER ONLY)
Abs Immature Granulocytes: 0.06 10*3/uL (ref 0.00–0.07)
Basophils Absolute: 0.1 10*3/uL (ref 0.0–0.1)
Basophils Relative: 0 %
Eosinophils Absolute: 0.2 10*3/uL (ref 0.0–0.5)
Eosinophils Relative: 1 %
HCT: 39 % (ref 39.0–52.0)
Hemoglobin: 13.1 g/dL (ref 13.0–17.0)
Immature Granulocytes: 0 %
Lymphocytes Relative: 14 %
Lymphs Abs: 1.8 10*3/uL (ref 0.7–4.0)
MCH: 29.8 pg (ref 26.0–34.0)
MCHC: 33.6 g/dL (ref 30.0–36.0)
MCV: 88.6 fL (ref 80.0–100.0)
Monocytes Absolute: 1.3 10*3/uL — ABNORMAL HIGH (ref 0.1–1.0)
Monocytes Relative: 10 %
Neutro Abs: 10.1 10*3/uL — ABNORMAL HIGH (ref 1.7–7.7)
Neutrophils Relative %: 75 %
Platelet Count: 302 10*3/uL (ref 150–400)
RBC: 4.4 MIL/uL (ref 4.22–5.81)
RDW: 16.1 % — ABNORMAL HIGH (ref 11.5–15.5)
WBC Count: 13.5 10*3/uL — ABNORMAL HIGH (ref 4.0–10.5)
nRBC: 0 % (ref 0.0–0.2)

## 2020-10-13 MED ORDER — SODIUM CHLORIDE 0.9 % IV SOLN
200.0000 mg | Freq: Once | INTRAVENOUS | Status: AC
  Administered 2020-10-13: 200 mg via INTRAVENOUS
  Filled 2020-10-13: qty 8

## 2020-10-13 MED ORDER — SODIUM CHLORIDE 0.9 % IV SOLN
Freq: Once | INTRAVENOUS | Status: AC
  Filled 2020-10-13: qty 250

## 2020-10-13 MED ORDER — SODIUM CHLORIDE 0.9% FLUSH
10.0000 mL | INTRAVENOUS | Status: DC | PRN
  Administered 2020-10-13: 10 mL
  Filled 2020-10-13: qty 10

## 2020-10-13 MED ORDER — HEPARIN SOD (PORK) LOCK FLUSH 100 UNIT/ML IV SOLN
500.0000 [IU] | Freq: Once | INTRAVENOUS | Status: AC | PRN
  Administered 2020-10-13: 500 [IU]
  Filled 2020-10-13: qty 5

## 2020-10-13 MED ORDER — SODIUM CHLORIDE 0.9% FLUSH
10.0000 mL | Freq: Once | INTRAVENOUS | Status: AC
  Administered 2020-10-13: 10 mL
  Filled 2020-10-13: qty 10

## 2020-10-13 NOTE — Progress Notes (Signed)
OK to treat with Keytruda today without CMP results per Dr. Julien Nordmann

## 2020-10-13 NOTE — Patient Instructions (Signed)
[facility]  Discharge Instructions: Thank you for choosing [facility] to provide your oncology and hematology care.   If you have a lab appointment with the [facility], please go directly to the [facility] and check in at the registration area.   Wear comfortable clothing and clothing appropriate for easy access to any Portacath or PICC line.   We strive to give you quality time with your provider. You may need to reschedule your appointment if you arrive late (15 or more minutes).  Arriving late affects you and other patients whose appointments are after yours.  Also, if you miss three or more appointments without notifying the office, you may be dismissed from the clinic at the provider's discretion.      For prescription refill requests, have your pharmacy contact our office and allow 72 hours for refills to be completed.    Today you received the following chemotherapy and/or immunotherapy agents Beryle Flock      To help prevent nausea and vomiting after your treatment, we encourage you to take your nausea medication as directed.  BELOW ARE SYMPTOMS THAT SHOULD BE REPORTED IMMEDIATELY: *FEVER GREATER THAN 100.4 F (38 C) OR HIGHER *CHILLS OR SWEATING *NAUSEA AND VOMITING THAT IS NOT CONTROLLED WITH YOUR NAUSEA MEDICATION *UNUSUAL SHORTNESS OF BREATH *UNUSUAL BRUISING OR BLEEDING *URINARY PROBLEMS (pain or burning when urinating, or frequent urination) *BOWEL PROBLEMS (unusual diarrhea, constipation, pain near the anus) TENDERNESS IN MOUTH AND THROAT WITH OR WITHOUT PRESENCE OF ULCERS (sore throat, sores in mouth, or a toothache) UNUSUAL RASH, SWELLING OR PAIN  UNUSUAL VAGINAL DISCHARGE OR ITCHING   Items with * indicate a potential emergency and should be followed up as soon as possible or go to the Emergency Department if any problems should occur.  Please show the CHEMOTHERAPY ALERT CARD or IMMUNOTHERAPY ALERT CARD at check-in to  the Emergency Department and triage nurse.  Should you have questions after your visit or need to cancel or reschedule your appointment, please contact [facility]  Dept: [phone]  and follow the prompts.  Office hours are 8:00 a.m. to 4:30 p.m. Monday - Friday. Please note that voicemails left after 4:00 p.m. may not be returned until the following business day.  We are closed weekends and major holidays. You have access to a nurse at all times for urgent questions. Please call the main number to the clinic Dept: [phone] and follow the prompts.   For any non-urgent questions, you may also contact your provider using MyChart. We now offer e-Visits for anyone 93 and older to request care online for non-urgent symptoms. For details visit [email or web address].   Also download the MyChart app! Go to the app store, search "MyChart", open the app, select [facility], and log in with your MyChart username and password.  Due to Covid, a mask is required upon entering the hospital/clinic. If you do not have a mask, one will be given to you upon arrival. For doctor visits, patients may have 1 support person aged 45 or older with them. For treatment visits, patients cannot have anyone with them due to current Covid guidelines and our immunocompromised population.

## 2020-10-13 NOTE — Progress Notes (Signed)
[facility] [phone]   [phone]  OFFICE PROGRESS NOTE  [name], NP [address] [name] [address] [place] [address]  DIAGNOSIS: Stage IV (T3, N0, M1C) non-small cell lung cancer, adenocarcinoma.  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 He was diagnosed in July 2021.   Molecular Biomarkers:  MSI-High DETECTED Pembrolizumab Atezolizumab, Avelumab, Cemiplimab, Dostarlimab, Durvalumab, Ipilimumab, Nivolumab   STK11Splice Site SNV 7.3% Everolimus, Temsirolimus Yes   [id number] 1.7% Binimetinib Yes   [id number] 0.4%   Niraparib, Olaparib, Rucaparib, Talazoparib, Tazemetostat Yes   PRIOR THERAPY:  1) SRS to the solitary brain metastasis under the care of Dr. Lisbeth Renshaw. Last treatment 11/14/19. 2) Weekly concurrent chemoradiation with carboplatin for an AUC of 2, paclitaxel 45 mg/m2.  First dose expected on 11/25/2019. Status post 7 cycles, last dose was giving 01/06/2020 with partial response.    CURRENT THERAPY:  1)  Immunotherapy with Keytruda 200 mg IV every 3 weeks.  First dose February 10, 2020 for a patient with MSI high.  Status post 10 cycles. 2) Avastin 15 mg/KG every 3 weeks.  First dose today for the vasogenic edema of the brain.S/P 7 cycles.  INTERVAL HISTORY: Joseph Atkins 60 y.o. male returns to the clinic today for follow-up visit accompanied by his wife.  The patient is feeling fine today with no concerning complaints except for intermittent pain on the right breast area.  He denied having any current shortness of breath, cough or hemoptysis.  He denied having any fever or chills.  He has no nausea, vomiting, diarrhea or constipation.  He denied having any headache or visual changes.  The patient denied having any recent weight loss or night sweats.  He continues to tolerate his treatment with Keytruda fairly well.  He had repeat CT  scan of the chest, abdomen pelvis performed recently and he is here for evaluation and discussion of his discuss results.  MEDICAL HISTORY: Past Medical History:  Diagnosis Date   Cancer ([place])    lung cancer   Diverticulosis    GERD (gastroesophageal reflux disease)    Hx of small bowel obstruction    Hyperlipidemia    Hypertension    Hypothyroidism    IBS (irritable bowel syndrome)    Substance abuse ([place])    Alcoholic, Drug addition   Thyroid disease     ALLERGIES:  is allergic to penicillins.  MEDICATIONS:  Current Outpatient Medications  Medication Sig Dispense Refill   albuterol (PROVENTIL HFA;VENTOLIN HFA) 108 (90 Base) MCG/ACT inhaler Inhale 2 puffs into the lungs every 6 (six) hours as needed for wheezing or shortness of breath. 1 Inhaler 0   ANORO ELLIPTA 62.5-25 MCG/INH AEPB 1 puff daily.     cyclobenzaprine (FLEXERIL) 10 MG tablet Take 10 mg by mouth 2 (two) times daily as needed for muscle spasms. (Patient not taking: No sig reported)     docusate sodium (COLACE) 100 MG capsule Take 100 mg by mouth daily.     DULoxetine (CYMBALTA) 20 MG capsule Take 1 capsule (20 mg total) by mouth 2 (two) times daily. 60 capsule 5   fluticasone furoate-vilanterol (BREO ELLIPTA) 100-25 MCG/INH AEPB Inhale 1 puff into the lungs daily. 30 each 3   levothyroxine (SYNTHROID) 100 MCG tablet TAKE 1 TABLET BY MOUTH EVERY DAY 90 tablet 1   lidocaine-prilocaine (EMLA) cream Apply 1 application topically as needed. 30 g  0   omeprazole (PRILOSEC) 40 MG capsule Take 1 capsule (40 mg total) by mouth daily. 90 capsule 4   ondansetron (ZOFRAN) 4 MG tablet Take 1 tablet (4 mg total) by mouth every 8 (eight) hours as needed. (Patient not taking: Reported on 09/22/2020) 40 tablet 2   oxyCODONE-acetaminophen (PERCOCET/ROXICET) 5-325 MG tablet Take 1 tablet by mouth every 4 (four) hours as needed for severe pain. (Patient not taking: No sig reported) 30 tablet 0   polyethylene glycol powder (MIRALAX) powder  Take 17 g by mouth daily. 255 g 11   prochlorperazine (COMPAZINE) 10 MG tablet Take 1 tablet (10 mg total) by mouth every 6 (six) hours as needed. 30 tablet 2   TRULANCE 3 MG TABS Take 1 tablet by mouth daily.     zolpidem (AMBIEN CR) 12.5 MG CR tablet Take 1 tablet (12.5 mg total) by mouth at bedtime as needed for sleep. (Patient not taking: No sig reported) 30 tablet 5   No current facility-administered medications for this visit.    SURGICAL HISTORY:  Past Surgical History:  Procedure Laterality Date   arm surgery Right    BRONCHIAL BRUSHINGS  10/24/2019   Procedure: BRONCHIAL BRUSHINGS;  Surgeon: Leslye Peer, MD;  Location: [facility] ENDOSCOPY;  Service: Cardiopulmonary;;  right lower lobe    BRONCHIAL BRUSHINGS  11/05/2019   Procedure: BRONCHIAL BRUSHINGS;  Surgeon: Leslye Peer, MD;  Location: [facility] ENDOSCOPY;  Service: Pulmonary;;   BRONCHIAL NEEDLE ASPIRATION BIOPSY  10/24/2019   Procedure: BRONCHIAL NEEDLE ASPIRATION BIOPSIES;  Surgeon: Leslye Peer, MD;  Location: [facility] ENDOSCOPY;  Service: Cardiopulmonary;;   BRONCHIAL NEEDLE ASPIRATION BIOPSY  11/05/2019   Procedure: BRONCHIAL NEEDLE ASPIRATION BIOPSIES;  Surgeon: Leslye Peer, MD;  Location: [facility] ENDOSCOPY;  Service: Pulmonary;;   ENDOBRONCHIAL ULTRASOUND N/A 10/24/2019   Procedure: ENDOBRONCHIAL ULTRASOUND;  Surgeon: Leslye Peer, MD;  Location: [facility] ENDOSCOPY;  Service: Cardiopulmonary;  Laterality: N/A;   FINGER SURGERY Right    Middle   IR IMAGING GUIDED PORT INSERTION  11/19/2019   VIDEO BRONCHOSCOPY N/A 10/24/2019   Procedure: VIDEO BRONCHOSCOPY WITHOUT FLUORO;  Surgeon: Leslye Peer, MD;  Location: [facility] ENDOSCOPY;  Service: Cardiopulmonary;  Laterality: N/A;   VIDEO BRONCHOSCOPY WITH ENDOBRONCHIAL NAVIGATION N/A 11/05/2019   Procedure: VIDEO BRONCHOSCOPY WITH ENDOBRONCHIAL NAVIGATION;  Surgeon: Leslye Peer, MD;  Location: [facility] ENDOSCOPY;  Service: Pulmonary;  Laterality: N/A;    REVIEW OF SYSTEMS:  Constitutional: positive  for fatigue Eyes: negative Ears, nose, mouth, throat, and face: negative Respiratory: negative Cardiovascular: negative Gastrointestinal: negative Genitourinary:negative Integument/breast: negative Hematologic/lymphatic: negative Musculoskeletal:negative Neurological: negative Behavioral/Psych: negative Endocrine: negative Allergic/Immunologic: negative   PHYSICAL EXAMINATION: General appearance: alert, cooperative, fatigued, and no distress Head: Normocephalic, without obvious abnormality, atraumatic Neck: no adenopathy, no JVD, supple, symmetrical, trachea midline, and thyroid not enlarged, symmetric, no tenderness/mass/nodules Lymph nodes: Cervical, supraclavicular, and axillary nodes normal. Resp: clear to auscultation bilaterally Back: symmetric, no curvature. ROM normal. No CVA tenderness. Cardio: regular rate and rhythm, S1, S2 normal, no murmur, click, rub or gallop GI: soft, non-tender; bowel sounds normal; no masses,  no organomegaly Extremities: extremities normal, atraumatic, no cyanosis or edema Neurologic: Alert and oriented X 3, normal strength and tone. Normal symmetric reflexes. Normal coordination and gait  ECOG PERFORMANCE STATUS: 1 - Symptomatic but completely ambulatory  Blood pressure 136/89, pulse 72, temperature (!) 97 F (36.1 C), temperature source Tympanic, resp. rate 20, weight 148 lb (67.1 kg), SpO2 99 %.  LABORATORY DATA: Lab Results  Component Value Date   WBC 13.5 (H) 10/13/2020   HGB 13.1 10/13/2020   HCT 39.0 10/13/2020   MCV 88.6 10/13/2020   PLT 302 10/13/2020      Chemistry      Component Value Date/Time   NA 138 09/22/2020 1013   NA 141 03/08/2017 1707   K 3.3 (L) 09/22/2020 1013   CL 101 09/22/2020 1013   CO2 25 09/22/2020 1013   BUN 9 09/22/2020 1013   BUN 12 03/08/2017 1707   CREATININE 0.81 09/22/2020 1013      Component Value Date/Time   CALCIUM 8.8 (L) 09/22/2020 1013   ALKPHOS 77 09/22/2020 1013   AST 22 09/22/2020  1013   ALT 15 09/22/2020 1013   BILITOT 0.5 09/22/2020 1013       RADIOGRAPHIC STUDIES: CT Chest W Contrast  Result Date: 09/25/2020 CLINICAL DATA:  Restaging non-small cell lung cancer. Ongoing immunotherapy. Prior radiation therapy. EXAM: CT CHEST, ABDOMEN, AND PELVIS WITH CONTRAST TECHNIQUE: Multidetector CT imaging of the chest, abdomen and pelvis was performed following the standard protocol during bolus administration of intravenous contrast. CONTRAST:  13mL OMNIPAQUE IOHEXOL 300 MG/ML  SOLN COMPARISON:  Multiple exams, including 06/29/2020 FINDINGS: CT CHEST FINDINGS Cardiovascular: Right Port-A-Cath tip: Cavoatrial junction. Left anterior descending coronary artery atherosclerotic calcification. Stable small anterior pericardial effusion. Mediastinum/Nodes: Unremarkable Lungs/Pleura: Centrilobular emphysema. Progressive right infrahilar and right paramediastinal volume loss and airspace opacity especially medially in the right lower lobe noted for example on image 106 of series 6. This surrounds an obscures the region of the prior right lower lobe nodule and is likely attributable to radiation pneumonitis. It is difficult to separately identify the nodule within the consolidation although there is a slightly hypodense 2.8 by 1.7 cm region in the vicinity of the previous nodule, prior measurement was 2.8 by 2.0 cm. Airway thickening is present, suggesting bronchitis or reactive airways disease. The small left lower lobe nodule seen previously has essentially resolved. The small right upper lobe nodule seen previously has essentially resolved. Musculoskeletal: Hemangioma eccentric to the right at the T3 vertebral level. CT ABDOMEN PELVIS FINDINGS Hepatobiliary: Hypodense 1.7 by 1.1 cm lesion in the left hepatic lobe on image 56 series 2, similar in appearance to the 10/18/2019 exam but without hypermetabolic activity in this vicinity on the interval PET-CT of 11/13/2019, and not appreciably changed  from 2018, compatible with a benign process. No worrisome liver lesion identified. The gallbladder appears normal. Pancreas: Unremarkable Spleen: Unremarkable Adrenals/Urinary Tract: 0.7 cm hypodense lesion of the left kidney lower pole is stable and likely a small benign cyst although technically nonspecific. The kidneys appear otherwise unremarkable. The adrenal glands appear normal. Urinary bladder unremarkable. Stomach/Bowel: Unremarkable Vascular/Lymphatic: Aortoiliac atherosclerotic vascular disease. Chronic occlusion of the left common iliac artery with reconstitution of the left external iliac artery primarily via the left inferior epigastric artery. Reproductive: Unremarkable Other: No supplemental non-categorized findings. Musculoskeletal: Unremarkable IMPRESSION: 1. Progressive right infrahilar and paramediastinal consolidation compared to prior exams compatible with radiation pneumonitis. Within this consolidation there is some faint hypodensity probably corresponding to the original right lower lobe nodule and minimally reduced in size compared to the nodule size on 06/29/2020. 2. Reduce conspicuity of the prior small nodules in the left lower lobe and right upper lobe. These have essentially resolved. 3. Other imaging findings of potential clinical significance: Aortic Atherosclerosis (ICD10-I70.0). Left anterior descending coronary artery atherosclerosis. Stable small anterior pericardial effusion. Emphysema (ICD10-J43.9). Airway thickening is present, suggesting bronchitis or reactive airways disease.  Benign-appearing hypodense lesion of the left hepatic lobe. Chronic occlusion of the left common iliac artery. Electronically Signed   By: Gaylyn Rong M.D.   On: 09/25/2020 09:26   CT Abdomen Pelvis W Contrast  Result Date: 09/25/2020 CLINICAL DATA:  Restaging non-small cell lung cancer. Ongoing immunotherapy. Prior radiation therapy. EXAM: CT CHEST, ABDOMEN, AND PELVIS WITH CONTRAST  TECHNIQUE: Multidetector CT imaging of the chest, abdomen and pelvis was performed following the standard protocol during bolus administration of intravenous contrast. CONTRAST:  OMNIPAQUE IOHEXOL 300 MG/ML  SOLN COMPARISON:  Multiple exams, including 06/29/2020 FINDINGS: CT CHEST FINDINGS Cardiovascular: Right Port-A-Cath tip: Cavoatrial junction. Left anterior descending coronary artery atherosclerotic calcification. Stable small anterior pericardial effusion. Mediastinum/Nodes: Unremarkable Lungs/Pleura: Centrilobular emphysema. Progressive right infrahilar and right paramediastinal volume loss and airspace opacity especially medially in the right lower lobe noted for example on image 106 of series 6. This surrounds an obscures the region of the prior right lower lobe nodule and is likely attributable to radiation pneumonitis. It is difficult to separately identify the nodule within the consolidation although there is a slightly hypodense 2.8 by 1.7 cm region in the vicinity of the previous nodule, prior measurement was 2.8 by 2.0 cm. Airway thickening is present, suggesting bronchitis or reactive airways disease. The small left lower lobe nodule seen previously has essentially resolved. The small right upper lobe nodule seen previously has essentially resolved. Musculoskeletal: Hemangioma eccentric to the right at the T3 vertebral level. CT ABDOMEN PELVIS FINDINGS Hepatobiliary: Hypodense 1.7 by 1.1 cm lesion in the left hepatic lobe on image 56 series 2, similar in appearance to the 10/18/2019 exam but without hypermetabolic activity in this vicinity on the interval PET-CT of 11/13/2019, and not appreciably changed from 2018, compatible with a benign process. No worrisome liver lesion identified. The gallbladder appears normal. Pancreas: Unremarkable Spleen: Unremarkable Adrenals/Urinary Tract: 0.7 cm hypodense lesion of the left kidney lower pole is stable and likely a small benign cyst although  technically nonspecific. The kidneys appear otherwise unremarkable. The adrenal glands appear normal. Urinary bladder unremarkable. Stomach/Bowel: Unremarkable Vascular/Lymphatic: Aortoiliac atherosclerotic vascular disease. Chronic occlusion of the left common iliac artery with reconstitution of the left external iliac artery primarily via the left inferior epigastric artery. Reproductive: Unremarkable Other: No supplemental non-categorized findings. Musculoskeletal: Unremarkable IMPRESSION: 1. Progressive right infrahilar and paramediastinal consolidation compared to prior exams compatible with radiation pneumonitis. Within this consolidation there is some faint hypodensity probably corresponding to the original right lower lobe nodule and minimally reduced in size compared to the nodule size on 06/29/2020. 2. Reduce conspicuity of the prior small nodules in the left lower lobe and right upper lobe. These have essentially resolved. 3. Other imaging findings of potential clinical significance: Aortic Atherosclerosis (ICD10-I70.0). Left anterior descending coronary artery atherosclerosis. Stable small anterior pericardial effusion. Emphysema (ICD10-J43.9). Airway thickening is present, suggesting bronchitis or reactive airways disease. Benign-appearing hypodense lesion of the left hepatic lobe. Chronic occlusion of the left common iliac artery. Electronically Signed   By: Gaylyn Rong M.D.   On: 09/25/2020 09:26     ASSESSMENT AND PLAN: This is a very pleasant 60 years old white male with a stage IV (t3, N0, M1c) non-small cell lung cancer, adenocarcinoma with MSI high presented with right lower lobe/infrahilar mass in addition to solitary brain metastasis in the left cerebellum diagnosed in July 2021. He is status post SRS to the solitary brain metastasis. The patient completed a course of concurrent chemoradiation with weekly carboplatin and paclitaxel.  He tolerated the treatment well except for  fatigue and mild odynophagia. He had repeat CT scan of the chest performed recently.  I personally and independently reviewed the scans and discussed the results with the patient and his wife. His scan showed interval decrease in the volume of the right infrahilar mass with no other evidence of metastatic disease. The patient has MSI high and I recommended for him treatment with immunotherapy with single agent Keytruda 200 mg IV every 3 weeks for a total of 2 years unless the patient has unacceptable toxicity or disease progression. He is status post 10 cycles of treatment with Keytruda.  He also received 7 cycles of Avastin for the vasogenic edema in the brain.   Avastin will be on hold for now unless needed in the future. The patient has been tolerating this treatment well with no concerning adverse effects. He had repeat CT scan of the chest, abdomen pelvis performed few weeks ago. I personally and independently reviewed the scans and discussed the results with the patient and his wife. His scan showed no concerning findings for disease progression but there was some evolution of the radiation pneumonitis. I recommended for the patient to continue his treatment with Keytruda as a single agent and he will proceed with cycle #11 today. He will come back for follow-up visit in 3 weeks for evaluation before the next cycle of his treatment. The patient was advised to call immediately if he has any concerning symptoms in the interval. For the metastatic brain lesions, he is followed by Dr. Mickeal Skinner and Dr. Lisbeth Renshaw.  He is scheduled to have MRI of the brain next month.   The patient voices understanding of current disease status and treatment options and is in agreement with the current care plan.  All questions were answered. The patient knows to call the clinic with any problems, questions or concerns. We can certainly see the patient much sooner if necessary.  Disclaimer: This note was dictated with  voice recognition software. Similar sounding words can inadvertently be transcribed and may not be corrected upon review.

## 2020-10-14 LAB — TSH: TSH: 3.214 u[IU]/mL (ref 0.320–4.118)

## 2020-10-30 NOTE — Progress Notes (Signed)
[facility] OFFICE PROGRESS NOTE  [name], NP [address] [name] [address] [place] [address]  DIAGNOSIS: Stage IV carcinoma, non-small cell lung cancer, adenocarcinoma.  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 He was diagnosed in July 2021.   Molecular Biomarkers:  MSI-High DETECTED Pembrolizumab Atezolizumab, Avelumab, Cemiplimab, Dostarlimab, Durvalumab, Ipilimumab, Nivolumab   STK11Splice Site SNV 9.4% Everolimus, Temsirolimus Yes   KRASG12D 1.7% Binimetinib Yes   [id number] 0.4%   Niraparib, Olaparib, Rucaparib, Talazoparib, Tazemetostat Yes    PRIOR THERAPY: 1) SRS to the solitary brain metastasis under the care of Dr. Lisbeth Renshaw. Last treatment 7/22/212)  2) Weekly concurrent chemoradiation with carboplatin for an AUC of 2, paclitaxel 45 mg/m2.  First dose expected on 11/25/2019. Status post 7 cycles, last dose was giving 01/06/2020 with partial response.   CURRENT THERAPY:  1)  Immunotherapy with Keytruda 200 mg IV every 3 weeks.  First dose February 10, 2020 for a patient with MSI high.  Status post 11 cycles. 2) Avastin 15 mg/KG every 3 weeks.  First dose today for the vasogenic edema of the brain.S/P 7 cycles.  INTERVAL HISTORY: Atkins Joseph 60 y.o. male returns to the clinic today for a follow-up visit accompanied by his wife.  The patient is feeling fairly well today without any concerning complaints except for generalized itching. His itching comes and goes but has gotten a little worse recently. He has itching on his back, chest, and legs. He has a few small scattered skin lesions for which he scratched and caused some scabs. He has been using Eucerin which helps some. He does not want to use benadryl because he already spends most of the day laying around watching TV.    The patient is tolerating single agent immunotherapy with Keytruda well without  any concerning adverse side effects except for itching and some small scattered skin lesions.  Today, the patient denies any fever, chills, night sweats, or weight loss.  He reports stable dyspnea on exertion. He reports a cough secondary to smoking. He is still smoking 1.5 packs of cigarettes per day. He also had been using tobacco pouches but stopped using those due to gum sensitivity. He does not have mouth ulcers or thrush but his gums are sensitive and sore with brushing his teeth and certain foods such as peppers.  He denies any or hemoptysis.  He denies any nausea, vomiting, or diarrhea. He has baseline constipation for which he uses a combination of miralax and a stool softener. He is scheduled to have a repeat brain MRI next month in August and a follow-up visit with radiation oncology at that time. The patient is here today for evaluation before starting cycle #12.    MEDICAL HISTORY: Past Medical History:  Diagnosis Date   Cancer ([place])    lung cancer   Diverticulosis    GERD (gastroesophageal reflux disease)    Hx of small bowel obstruction    Hyperlipidemia    Hypertension    Hypothyroidism    IBS (irritable bowel syndrome)    Substance abuse ([place])    Alcoholic, Drug addition   Thyroid disease     ALLERGIES:  is allergic to penicillins.  MEDICATIONS:  Current Outpatient Medications  Medication Sig Dispense Refill   albuterol (PROVENTIL HFA;VENTOLIN HFA) 108 (90 Base) MCG/ACT inhaler Inhale 2 puffs into the lungs every 6 (six) hours as needed for wheezing or shortness  of breath. 1 Inhaler 0   ANORO ELLIPTA 62.5-25 MCG/INH AEPB 1 puff daily.     cyclobenzaprine (FLEXERIL) 10 MG tablet Take 10 mg by mouth 2 (two) times daily as needed for muscle spasms.     docusate sodium (COLACE) 100 MG capsule Take 100 mg by mouth daily.     DULoxetine (CYMBALTA) 20 MG capsule Take 1 capsule (20 mg total) by mouth 2 (two) times daily. 60 capsule 5   fluticasone furoate-vilanterol (BREO  ELLIPTA) 100-25 MCG/INH AEPB Inhale 1 puff into the lungs daily. 30 each 3   levothyroxine (SYNTHROID) 100 MCG tablet TAKE 1 TABLET BY MOUTH EVERY DAY 90 tablet 1   lidocaine-prilocaine (EMLA) cream Apply 1 application topically as needed. 30 g 0   omeprazole (PRILOSEC) 40 MG capsule Take 1 capsule (40 mg total) by mouth daily. 90 capsule 4   ondansetron (ZOFRAN) 4 MG tablet Take 1 tablet (4 mg total) by mouth every 8 (eight) hours as needed. 40 tablet 2   oxyCODONE-acetaminophen (PERCOCET/ROXICET) 5-325 MG tablet Take 1 tablet by mouth every 4 (four) hours as needed for severe pain. 30 tablet 0   polyethylene glycol powder (MIRALAX) powder Take 17 g by mouth daily. 255 g 11   prochlorperazine (COMPAZINE) 10 MG tablet Take 1 tablet (10 mg total) by mouth every 6 (six) hours as needed. 30 tablet 2   TRULANCE 3 MG TABS Take 1 tablet by mouth daily.     zolpidem (AMBIEN CR) 12.5 MG CR tablet Take 1 tablet (12.5 mg total) by mouth at bedtime as needed for sleep. 30 tablet 5   No current facility-administered medications for this visit.    SURGICAL HISTORY:  Past Surgical History:  Procedure Laterality Date   arm surgery Right    BRONCHIAL BRUSHINGS  10/24/2019   Procedure: BRONCHIAL BRUSHINGS;  Surgeon: Collene Gobble, MD;  Location: [facility] ENDOSCOPY;  Service: Cardiopulmonary;;  right lower lobe    BRONCHIAL BRUSHINGS  11/05/2019   Procedure: BRONCHIAL BRUSHINGS;  Surgeon: Collene Gobble, MD;  Location: [facility] ENDOSCOPY;  Service: Pulmonary;;   BRONCHIAL NEEDLE ASPIRATION BIOPSY  10/24/2019   Procedure: BRONCHIAL NEEDLE ASPIRATION BIOPSIES;  Surgeon: Collene Gobble, MD;  Location: [facility];  Service: Cardiopulmonary;;   BRONCHIAL NEEDLE ASPIRATION BIOPSY  11/05/2019   Procedure: BRONCHIAL NEEDLE ASPIRATION BIOPSIES;  Surgeon: Collene Gobble, MD;  Location: [facility] ENDOSCOPY;  Service: Pulmonary;;   ENDOBRONCHIAL ULTRASOUND N/A 10/24/2019   Procedure: ENDOBRONCHIAL ULTRASOUND;  Surgeon: Collene Gobble,  MD;  Location: [facility];  Service: Cardiopulmonary;  Laterality: N/A;   FINGER SURGERY Right    Middle   IR IMAGING GUIDED PORT INSERTION  11/19/2019   VIDEO BRONCHOSCOPY N/A 10/24/2019   Procedure: VIDEO BRONCHOSCOPY WITHOUT FLUORO;  Surgeon: Collene Gobble, MD;  Location: [facility];  Service: Cardiopulmonary;  Laterality: N/A;   VIDEO BRONCHOSCOPY WITH ENDOBRONCHIAL NAVIGATION N/A 11/05/2019   Procedure: VIDEO BRONCHOSCOPY WITH ENDOBRONCHIAL NAVIGATION;  Surgeon: Collene Gobble, MD;  Location: [facility] ENDOSCOPY;  Service: Pulmonary;  Laterality: N/A;    REVIEW OF SYSTEMS:   Review of Systems  Constitutional: Positive for fatigue. Negative for appetite change, chills, fever and unexpected weight change.  HENT:   Negative for mouth sores, nosebleeds, sore throat and trouble swallowing.   Eyes: Negative for eye problems and icterus.  Respiratory: Positive for cough and dyspnea on exertion. Negative for hemoptysis and wheezing.   Cardiovascular: Negative for chest pain and leg swelling.  Gastrointestinal: Positive for baseline constipation.  Negative for abdominal pain, diarrhea, nausea and vomiting.  Genitourinary: Negative for bladder incontinence, difficulty urinating, dysuria, frequency and hematuria.   Musculoskeletal: Negative for back pain, gait problem, neck pain and neck stiffness.  Skin: Positive for generalized itching and few scattered skin lesions on back, abdomen, and arms. Neurological: Negative for dizziness, extremity weakness, gait problem, headaches, light-headedness and seizures.  Hematological: Negative for adenopathy. Does not bruise/bleed easily.  Psychiatric/Behavioral: Negative for confusion, depression and sleep disturbance. The patient is not nervous/anxious.     PHYSICAL EXAMINATION:  Blood pressure (!) 141/92, pulse 100, temperature (!) 96.8 F (36 C), temperature source Tympanic, resp. rate 19, height $RemoveBe'5\' 8"'NNNGGtNQp$  (1.727 m), weight 156 lb 3.2 oz (70.9 kg), SpO2 100  %.  ECOG PERFORMANCE STATUS: 1  Physical Exam  Constitutional: Oriented to person, place, and time and chronically ill appearing male and in no distress.  HENT:  Head: Normocephalic and atraumatic.  Mouth/Throat: Oropharynx is clear and moist. Poor dentition. No oropharyngeal exudate. No thrush or ulcerations seen.  Eyes: Conjunctivae are normal. Right eye exhibits no discharge. Left eye exhibits no discharge. No scleral icterus.  Neck: Normal range of motion. Neck supple.  Cardiovascular: Normal rate, regular rhythm, normal heart sounds and intact distal pulses.   Pulmonary/Chest: Effort normal and breath sounds normal. No respiratory distress. No wheezes. No rales.  Abdominal: Soft. Bowel sounds are normal. Exhibits no distension and no mass. There is no tenderness.  Musculoskeletal: Normal range of motion. Exhibits no edema.  Lymphadenopathy:    No cervical adenopathy.  Neurological: Alert and oriented to person, place, and time. Exhibits normal muscle tone. Gait normal. Coordination normal.  Skin: Few scattered dry skin lesions on back and abdomen. Positive for dry skin. No rash noted. Not diaphoretic. No erythema. No pallor.  Psychiatric: Mood, memory and judgment normal.  Vitals reviewed.  LABORATORY DATA: Lab Results  Component Value Date   WBC 9.2 11/03/2020   HGB 11.9 (L) 11/03/2020   HCT 36.6 (L) 11/03/2020   MCV 91.3 11/03/2020   PLT 177 11/03/2020      Chemistry      Component Value Date/Time   NA 137 11/03/2020 0824   NA 141 03/08/2017 1707   K 3.8 11/03/2020 0824   CL 104 11/03/2020 0824   CO2 23 11/03/2020 0824   BUN 6 11/03/2020 0824   BUN 12 03/08/2017 1707   CREATININE 0.75 11/03/2020 0824      Component Value Date/Time   CALCIUM 8.3 (L) 11/03/2020 0824   ALKPHOS 96 11/03/2020 0824   AST 31 11/03/2020 0824   ALT 21 11/03/2020 0824   BILITOT 0.3 11/03/2020 0824       RADIOGRAPHIC STUDIES:  No results found.   ASSESSMENT/PLAN:  This is a  very pleasant 60 year old Caucasian male diagnosed with stage IV non-small cell lung cancer, adenocarcinoma.  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 He was diagnosed in July 2021. His molecular studies by [facility] show he has MSI high which is a good marker for response to immunotherapy which will be important for future treatment.   The patient will complete SRS to the solidary brain metastasis under the care of Dr. Lisbeth Renshaw later today on 11/14/19.   He then underwent a course of weekly concurrent chemoradiation with carboplatin for an AUC of 2 and paclitaxel 45 mg per metered squared.  He tolerated this treatment well except for fatigue and mild odynophagia.  The patient showed evidence of disease progression with an increase in volume of the right infrahilar mass.  Since the patient has MSI high, the patient then underwent treatment with single agent immunotherapy with Keytruda 200 mg IV every 3 weeks.  The plan is to complete a total of 2 years unless the patient has unacceptable toxicity or disease progression.  The patient is status post 11 cycles.  He also received 7 cycles of Avastin for vasogenic edema in the brain.  Avastin has been on hold unless needed again in the future.   Labs reviewed.  Recommend that he proceed with cycle #12 today scheduled.   We will see him back for follow-up visit in 3 weeks for evaluation before starting cycle #13.  I reviewed his symptoms with Dr. Julien Nordmann. His generalized itching and few scattered skin lesions could be immunotherapy mediated. Provided patient education. He will continue to use lotion for dry skin. For generalized itching, discussed he can use benadryl if needed. However, I did caution this may cause drowsiness. He may also use hydrocortisone cream for more localized areas of itching as well.   For the gum soreness, advised to have good dental hygiene. I also  advised him to use salt water rinses. He did not have any ulcers or thrush on exam today. I also advised him to avoid tobacco pouches.   We also reviewed smoking cessation. He states he tried quitting and using NRT but it just gave him bad dreams. Discussed again quitting smoking will likely improve his cough, prevent worsening dyspnea on exertion, and help with his gum sensitivity/dentition.   He will up with radiation oncology next month as scheduled for repeat brain MRI.   The patient was advised to call immediately if he has any concerning symptoms in the interval. The patient voices understanding of current disease status and treatment options and is in agreement with the current care plan. All questions were answered. The patient knows to call the clinic with any problems, questions or concerns. We can certainly see the patient much sooner if necessary         No orders of the defined types were placed in this encounter.    The total time spent in the appointment was 20-29 minutes in this encounter.   Tram Wrenn L Chantee Cerino, PA-C 11/03/20

## 2020-11-03 ENCOUNTER — Inpatient Hospital Stay: Payer: 59

## 2020-11-03 ENCOUNTER — Encounter: Payer: Self-pay | Admitting: Physician Assistant

## 2020-11-03 ENCOUNTER — Other Ambulatory Visit: Payer: Self-pay

## 2020-11-03 ENCOUNTER — Inpatient Hospital Stay: Payer: 59 | Attending: Physician Assistant | Admitting: Physician Assistant

## 2020-11-03 VITALS — BP 141/92 | HR 100 | Temp 96.8°F | Resp 19 | Ht 68.0 in | Wt 156.2 lb

## 2020-11-03 DIAGNOSIS — C7931 Secondary malignant neoplasm of brain: Secondary | ICD-10-CM | POA: Diagnosis not present

## 2020-11-03 DIAGNOSIS — R1310 Dysphagia, unspecified: Secondary | ICD-10-CM | POA: Diagnosis not present

## 2020-11-03 DIAGNOSIS — C3491 Malignant neoplasm of unspecified part of right bronchus or lung: Secondary | ICD-10-CM

## 2020-11-03 DIAGNOSIS — F17210 Nicotine dependence, cigarettes, uncomplicated: Secondary | ICD-10-CM | POA: Diagnosis not present

## 2020-11-03 DIAGNOSIS — E079 Disorder of thyroid, unspecified: Secondary | ICD-10-CM | POA: Insufficient documentation

## 2020-11-03 DIAGNOSIS — K219 Gastro-esophageal reflux disease without esophagitis: Secondary | ICD-10-CM | POA: Diagnosis not present

## 2020-11-03 DIAGNOSIS — E785 Hyperlipidemia, unspecified: Secondary | ICD-10-CM | POA: Insufficient documentation

## 2020-11-03 DIAGNOSIS — C3431 Malignant neoplasm of lower lobe, right bronchus or lung: Secondary | ICD-10-CM | POA: Diagnosis not present

## 2020-11-03 DIAGNOSIS — E039 Hypothyroidism, unspecified: Secondary | ICD-10-CM | POA: Diagnosis not present

## 2020-11-03 DIAGNOSIS — I10 Essential (primary) hypertension: Secondary | ICD-10-CM | POA: Insufficient documentation

## 2020-11-03 DIAGNOSIS — R059 Cough, unspecified: Secondary | ICD-10-CM | POA: Insufficient documentation

## 2020-11-03 DIAGNOSIS — R0609 Other forms of dyspnea: Secondary | ICD-10-CM | POA: Insufficient documentation

## 2020-11-03 DIAGNOSIS — Z79899 Other long term (current) drug therapy: Secondary | ICD-10-CM | POA: Insufficient documentation

## 2020-11-03 DIAGNOSIS — Z5112 Encounter for antineoplastic immunotherapy: Secondary | ICD-10-CM | POA: Diagnosis not present

## 2020-11-03 DIAGNOSIS — G936 Cerebral edema: Secondary | ICD-10-CM | POA: Insufficient documentation

## 2020-11-03 DIAGNOSIS — Z95828 Presence of other vascular implants and grafts: Secondary | ICD-10-CM

## 2020-11-03 LAB — CMP (CANCER CENTER ONLY)
ALT: 21 U/L (ref 0–44)
AST: 31 U/L (ref 15–41)
Albumin: 2.4 g/dL — ABNORMAL LOW (ref 3.5–5.0)
Alkaline Phosphatase: 96 U/L (ref 38–126)
Anion gap: 10 (ref 5–15)
BUN: 6 mg/dL (ref 6–20)
CO2: 23 mmol/L (ref 22–32)
Calcium: 8.3 mg/dL — ABNORMAL LOW (ref 8.9–10.3)
Chloride: 104 mmol/L (ref 98–111)
Creatinine: 0.75 mg/dL (ref 0.61–1.24)
GFR, Estimated: >60 mL/min (ref >60)
Glucose, Bld: 160 mg/dL — ABNORMAL HIGH (ref 70–99)
Potassium: 3.8 mmol/L (ref 3.5–5.1)
Sodium: 137 mmol/L (ref 135–145)
Total Bilirubin: 0.3 mg/dL (ref 0.3–1.2)
Total Protein: 5.7 g/dL — ABNORMAL LOW (ref 6.5–8.1)

## 2020-11-03 LAB — CBC WITH DIFFERENTIAL (CANCER CENTER ONLY)
Abs Immature Granulocytes: 0.06 10*3/uL (ref 0.00–0.07)
Basophils Absolute: 0 10*3/uL (ref 0.0–0.1)
Basophils Relative: 0 %
Eosinophils Absolute: 0 10*3/uL (ref 0.0–0.5)
Eosinophils Relative: 0 %
HCT: 36.6 % — ABNORMAL LOW (ref 39.0–52.0)
Hemoglobin: 11.9 g/dL — ABNORMAL LOW (ref 13.0–17.0)
Immature Granulocytes: 1 %
Lymphocytes Relative: 15 %
Lymphs Abs: 1.4 10*3/uL (ref 0.7–4.0)
MCH: 29.7 pg (ref 26.0–34.0)
MCHC: 32.5 g/dL (ref 30.0–36.0)
MCV: 91.3 fL (ref 80.0–100.0)
Monocytes Absolute: 0.6 10*3/uL (ref 0.1–1.0)
Monocytes Relative: 7 %
Neutro Abs: 7 10*3/uL (ref 1.7–7.7)
Neutrophils Relative %: 77 %
Platelet Count: 177 10*3/uL (ref 150–400)
RBC: 4.01 MIL/uL — ABNORMAL LOW (ref 4.22–5.81)
RDW: 16.8 % — ABNORMAL HIGH (ref 11.5–15.5)
WBC Count: 9.2 10*3/uL (ref 4.0–10.5)
nRBC: 0 % (ref 0.0–0.2)

## 2020-11-03 LAB — TSH: TSH: 11.797 u[IU]/mL — ABNORMAL HIGH (ref 0.320–4.118)

## 2020-11-03 MED ORDER — SODIUM CHLORIDE 0.9 % IV SOLN
Freq: Once | INTRAVENOUS | Status: AC
Start: 2020-11-03 — End: 2020-11-03
  Filled 2020-11-03: qty 250

## 2020-11-03 MED ORDER — SODIUM CHLORIDE 0.9 % IV SOLN
200.0000 mg | Freq: Once | INTRAVENOUS | Status: AC
  Administered 2020-11-03: 200 mg via INTRAVENOUS
  Filled 2020-11-03: qty 8

## 2020-11-03 MED ORDER — SODIUM CHLORIDE 0.9% FLUSH
10.0000 mL | INTRAVENOUS | Status: DC | PRN
Start: 2020-11-03 — End: 2020-11-03
  Administered 2020-11-03: 10 mL
  Filled 2020-11-03: qty 10

## 2020-11-03 MED ORDER — HEPARIN SOD (PORK) LOCK FLUSH 100 UNIT/ML IV SOLN
500.0000 [IU] | Freq: Once | INTRAVENOUS | Status: AC | PRN
  Administered 2020-11-03: 500 [IU]
  Filled 2020-11-03: qty 5

## 2020-11-03 MED ORDER — SODIUM CHLORIDE 0.9% FLUSH
10.0000 mL | Freq: Once | INTRAVENOUS | Status: AC
  Administered 2020-11-03: 10 mL
  Filled 2020-11-03: qty 10

## 2020-11-03 NOTE — Patient Instructions (Signed)
[facility] ONCOLOGY  Discharge Instructions: Thank you for choosing [facility] to provide your oncology and hematology care.   If you have a lab appointment with the [facility], please go directly to the [facility] and check in at the registration area.   Wear comfortable clothing and clothing appropriate for easy access to any Portacath or PICC line.   We strive to give you quality time with your provider. You may need to reschedule your appointment if you arrive late (15 or more minutes).  Arriving late affects you and other patients whose appointments are after yours.  Also, if you miss three or more appointments without notifying the office, you may be dismissed from the clinic at the provider's discretion.      For prescription refill requests, have your pharmacy contact our office and allow 72 hours for refills to be completed.    Today you received the following chemotherapy and/or immunotherapy agents Joseph Atkins      To help prevent nausea and vomiting after your treatment, we encourage you to take your nausea medication as directed.  BELOW ARE SYMPTOMS THAT SHOULD BE REPORTED IMMEDIATELY: *FEVER GREATER THAN 100.4 F (38 C) OR HIGHER *CHILLS OR SWEATING *NAUSEA AND VOMITING THAT IS NOT CONTROLLED WITH YOUR NAUSEA MEDICATION *UNUSUAL SHORTNESS OF BREATH *UNUSUAL BRUISING OR BLEEDING *URINARY PROBLEMS (pain or burning when urinating, or frequent urination) *BOWEL PROBLEMS (unusual diarrhea, constipation, pain near the anus) TENDERNESS IN MOUTH AND THROAT WITH OR WITHOUT PRESENCE OF ULCERS (sore throat, sores in mouth, or a toothache) UNUSUAL RASH, SWELLING OR PAIN  UNUSUAL VAGINAL DISCHARGE OR ITCHING   Items with * indicate a potential emergency and should be followed up as soon as possible or go to the Emergency Department if any problems should occur.  Please show the CHEMOTHERAPY ALERT CARD or IMMUNOTHERAPY ALERT CARD at check-in to  the Emergency Department and triage nurse.  Should you have questions after your visit or need to cancel or reschedule your appointment, please contact [facility]  Dept: [phone]  and follow the prompts.  Office hours are 8:00 a.m. to 4:30 p.m. Monday - Friday. Please note that voicemails left after 4:00 p.m. may not be returned until the following business day.  We are closed weekends and major holidays. You have access to a nurse at all times for urgent questions. Please call the main number to the clinic Dept: [phone] and follow the prompts.   For any non-urgent questions, you may also contact your provider using MyChart. We now offer e-Visits for anyone 57 and older to request care online for non-urgent symptoms. For details visit [email or web address].   Also download the MyChart app! Go to the app store, search "MyChart", open the app, select [facility], and log in with your MyChart username and password.  Due to Covid, a mask is required upon entering the hospital/clinic. If you do not have a mask, one will be given to you upon arrival. For doctor visits, patients may have 1 support person aged 76 or older with them. For treatment visits, patients cannot have anyone with them due to current Covid guidelines and our immunocompromised population.

## 2020-11-03 NOTE — Patient Instructions (Signed)

## 2020-11-11 ENCOUNTER — Telehealth: Payer: Self-pay

## 2020-11-11 NOTE — Telephone Encounter (Signed)
Fax received from [facility] requesting authorization of refills for Ambien and Cymbalta.  Per Dr. Julien Nordmann, pt need to follow-up with his PCP for management of his Anxiety, Insomnia and Depression.  I have faxed the requests back declining the refills with provider comments indicating pt needs to follow-up with his PCP.

## 2020-11-24 ENCOUNTER — Inpatient Hospital Stay: Payer: 59 | Attending: Internal Medicine | Admitting: Internal Medicine

## 2020-11-24 ENCOUNTER — Inpatient Hospital Stay: Payer: 59

## 2020-11-24 ENCOUNTER — Encounter: Payer: Self-pay | Admitting: Internal Medicine

## 2020-11-24 ENCOUNTER — Other Ambulatory Visit: Payer: Self-pay

## 2020-11-24 VITALS — BP 132/79 | HR 85 | Temp 97.4°F | Resp 19 | Ht 68.0 in | Wt 150.9 lb

## 2020-11-24 DIAGNOSIS — Z79899 Other long term (current) drug therapy: Secondary | ICD-10-CM | POA: Insufficient documentation

## 2020-11-24 DIAGNOSIS — K5790 Diverticulosis of intestine, part unspecified, without perforation or abscess without bleeding: Secondary | ICD-10-CM | POA: Insufficient documentation

## 2020-11-24 DIAGNOSIS — Z5112 Encounter for antineoplastic immunotherapy: Secondary | ICD-10-CM | POA: Insufficient documentation

## 2020-11-24 DIAGNOSIS — C7931 Secondary malignant neoplasm of brain: Secondary | ICD-10-CM | POA: Diagnosis not present

## 2020-11-24 DIAGNOSIS — K589 Irritable bowel syndrome without diarrhea: Secondary | ICD-10-CM | POA: Diagnosis not present

## 2020-11-24 DIAGNOSIS — E785 Hyperlipidemia, unspecified: Secondary | ICD-10-CM | POA: Insufficient documentation

## 2020-11-24 DIAGNOSIS — C3491 Malignant neoplasm of unspecified part of right bronchus or lung: Secondary | ICD-10-CM

## 2020-11-24 DIAGNOSIS — C3431 Malignant neoplasm of lower lobe, right bronchus or lung: Secondary | ICD-10-CM | POA: Diagnosis not present

## 2020-11-24 DIAGNOSIS — C3490 Malignant neoplasm of unspecified part of unspecified bronchus or lung: Secondary | ICD-10-CM

## 2020-11-24 DIAGNOSIS — K219 Gastro-esophageal reflux disease without esophagitis: Secondary | ICD-10-CM | POA: Insufficient documentation

## 2020-11-24 DIAGNOSIS — E039 Hypothyroidism, unspecified: Secondary | ICD-10-CM | POA: Diagnosis not present

## 2020-11-24 DIAGNOSIS — F1010 Alcohol abuse, uncomplicated: Secondary | ICD-10-CM | POA: Insufficient documentation

## 2020-11-24 DIAGNOSIS — Z95828 Presence of other vascular implants and grafts: Secondary | ICD-10-CM

## 2020-11-24 LAB — CBC WITH DIFFERENTIAL (CANCER CENTER ONLY)
Abs Immature Granulocytes: 0.08 10*3/uL — ABNORMAL HIGH (ref 0.00–0.07)
Basophils Absolute: 0.1 10*3/uL (ref 0.0–0.1)
Basophils Relative: 1 %
Eosinophils Absolute: 0.1 10*3/uL (ref 0.0–0.5)
Eosinophils Relative: 1 %
HCT: 37.1 % — ABNORMAL LOW (ref 39.0–52.0)
Hemoglobin: 12.4 g/dL — ABNORMAL LOW (ref 13.0–17.0)
Immature Granulocytes: 1 %
Lymphocytes Relative: 17 %
Lymphs Abs: 1.6 10*3/uL (ref 0.7–4.0)
MCH: 31.4 pg (ref 26.0–34.0)
MCHC: 33.4 g/dL (ref 30.0–36.0)
MCV: 93.9 fL (ref 80.0–100.0)
Monocytes Absolute: 0.7 10*3/uL (ref 0.1–1.0)
Monocytes Relative: 8 %
Neutro Abs: 6.5 10*3/uL (ref 1.7–7.7)
Neutrophils Relative %: 72 %
Platelet Count: 265 10*3/uL (ref 150–400)
RBC: 3.95 MIL/uL — ABNORMAL LOW (ref 4.22–5.81)
RDW: 17.2 % — ABNORMAL HIGH (ref 11.5–15.5)
WBC Count: 8.9 10*3/uL (ref 4.0–10.5)
nRBC: 0 % (ref 0.0–0.2)

## 2020-11-24 LAB — CMP (CANCER CENTER ONLY)
ALT: 20 U/L (ref 0–44)
AST: 25 U/L (ref 15–41)
Albumin: 3 g/dL — ABNORMAL LOW (ref 3.5–5.0)
Alkaline Phosphatase: 69 U/L (ref 38–126)
Anion gap: 7 (ref 5–15)
BUN: <4 mg/dL — ABNORMAL LOW (ref 6–20)
CO2: 26 mmol/L (ref 22–32)
Calcium: 8.6 mg/dL — ABNORMAL LOW (ref 8.9–10.3)
Chloride: 107 mmol/L (ref 98–111)
Creatinine: 0.82 mg/dL (ref 0.61–1.24)
GFR, Estimated: >60 mL/min (ref >60)
Glucose, Bld: 96 mg/dL (ref 70–99)
Potassium: 3.7 mmol/L (ref 3.5–5.1)
Sodium: 140 mmol/L (ref 135–145)
Total Bilirubin: 0.4 mg/dL (ref 0.3–1.2)
Total Protein: 5.9 g/dL — ABNORMAL LOW (ref 6.5–8.1)

## 2020-11-24 LAB — TSH: TSH: 16.489 u[IU]/mL — ABNORMAL HIGH (ref 0.350–4.500)

## 2020-11-24 MED ORDER — HEPARIN SOD (PORK) LOCK FLUSH 100 UNIT/ML IV SOLN
500.0000 [IU] | Freq: Once | INTRAVENOUS | Status: AC | PRN
  Administered 2020-11-24: 500 [IU]
  Filled 2020-11-24: qty 5

## 2020-11-24 MED ORDER — SODIUM CHLORIDE 0.9% FLUSH
10.0000 mL | INTRAVENOUS | Status: DC | PRN
  Administered 2020-11-24: 10 mL
  Filled 2020-11-24: qty 10

## 2020-11-24 MED ORDER — SODIUM CHLORIDE 0.9% FLUSH
10.0000 mL | Freq: Once | INTRAVENOUS | Status: AC
  Administered 2020-11-24: 10 mL
  Filled 2020-11-24: qty 10

## 2020-11-24 MED ORDER — SODIUM CHLORIDE 0.9 % IV SOLN
Freq: Once | INTRAVENOUS | Status: AC
  Filled 2020-11-24: qty 250

## 2020-11-24 MED ORDER — SODIUM CHLORIDE 0.9 % IV SOLN
200.0000 mg | Freq: Once | INTRAVENOUS | Status: AC
  Administered 2020-11-24: 200 mg via INTRAVENOUS
  Filled 2020-11-24: qty 8

## 2020-11-24 NOTE — Patient Instructions (Signed)
Steps to Quit Smoking Smoking tobacco is the leading cause of preventable death. It can affect almost every organ in the body. Smoking puts you and people around you at risk for many serious, long-lasting (chronic) diseases. Quitting smoking can be hard, but it is one of the best things that you can do for your health. It is never too late to quit. How do I get ready to quit? When you decide to quit smoking, make a plan to help you succeed. Before you quit: Pick a date to quit. Set a date within the next 2 weeks to give you time to prepare. Write down the reasons why you are quitting. Keep this list in places where you will see it often. Tell your family, friends, and co-workers that you are quitting. Their support is important. Talk with your doctor about the choices that may help you quit. Find out if your health insurance will pay for these treatments. Know the people, places, things, and activities that make you want to smoke (triggers). Avoid them. What first steps can I take to quit smoking? Throw away all cigarettes at home, at work, and in your car. Throw away the things that you use when you smoke, such as ashtrays and lighters. Clean your car. Make sure to empty the ashtray. Clean your home, including curtains and carpets. What can I do to help me quit smoking? Talk with your doctor about taking medicines and seeing a counselor at the same time. You are more likely to succeed when you do both. If you are pregnant or breastfeeding, talk with your doctor about counseling or other ways to quit smoking. Do not take medicine to help you quit smoking unless your doctor tells you to do so. To quit smoking: Quit right away Quit smoking totally, instead of slowly cutting back on how much you smoke over a period of time. Go to counseling. You are more likely to quit if you go to counseling sessions regularly. Take medicine You may take medicines to help you quit. Some medicines need a  prescription, and some you can buy over-the-counter. Some medicines may contain a drug called nicotine to replace the nicotine in cigarettes. Medicines may: Help you to stop having the desire to smoke (cravings). Help to stop the problems that come when you stop smoking (withdrawal symptoms). Your doctor may ask you to use: Nicotine patches, gum, or lozenges. Nicotine inhalers or sprays. Non-nicotine medicine that is taken by mouth. Find resources Find resources and other ways to help you quit smoking and remain smoke-free after you quit. These resources are most helpful when you use them often. They include: Online chats with a counselor. Phone quitlines. Printed self-help materials. Support groups or group counseling. Text messaging programs. Mobile phone apps. Use apps on your mobile phone or tablet that can help you stick to your quit plan. There are many free apps for mobile phones and tablets as well as websites. Examples include Quit Guide from the CDC and smokefree.gov  What things can I do to make it easier to quit?  Talk to your family and friends. Ask them to support and encourage you. Call a phone quitline (1-800-QUIT-NOW), reach out to support groups, or work with a counselor. Ask people who smoke to not smoke around you. Avoid places that make you want to smoke, such as: Bars. Parties. Smoke-break areas at work. Spend time with people who do not smoke. Lower the stress in your life. Stress can make you want to   smoke. Try these things to help your stress: Getting regular exercise. Doing deep-breathing exercises. Doing yoga. Meditating. Doing a body scan. To do this, close your eyes, focus on one area of your body at a time from head to toe. Notice which parts of your body are tense. Try to relax the muscles in those areas. How will I feel when I quit smoking? Day 1 to 3 weeks Within the first 24 hours, you may start to have some problems that come from quitting tobacco.  These problems are very bad 2-3 days after you quit, but they do not often last for more than 2-3 weeks. You may get these symptoms: Mood swings. Feeling restless, nervous, angry, or annoyed. Trouble concentrating. Dizziness. Strong desire for high-sugar foods and nicotine. Weight gain. Trouble pooping (constipation). Feeling like you may vomit (nausea). Coughing or a sore throat. Changes in how the medicines that you take for other issues work in your body. Depression. Trouble sleeping (insomnia). Week 3 and afterward After the first 2-3 weeks of quitting, you may start to notice more positive results, such as: Better sense of smell and taste. Less coughing and sore throat. Slower heart rate. Lower blood pressure. Clearer skin. Better breathing. Fewer sick days. Quitting smoking can be hard. Do not give up if you fail the first time. Some people need to try a few times before they succeed. Do your best to stick to your quit plan, and talk with your doctor if you have any questions or concerns. Summary Smoking tobacco is the leading cause of preventable death. Quitting smoking can be hard, but it is one of the best things that you can do for your health. When you decide to quit smoking, make a plan to help you succeed. Quit smoking right away, not slowly over a period of time. When you start quitting, seek help from your doctor, family, or friends. This information is not intended to replace advice given to you by your health care provider. Make sure you discuss any questions you have with your health care provider. Document Revised: 01/04/2019 Document Reviewed: 06/30/2018 Elsevier Patient Education  2022 [employer]  

## 2020-11-24 NOTE — Patient Instructions (Signed)
[facility]  Discharge Instructions: Thank you for choosing [facility] to provide your oncology and hematology care.   If you have a lab appointment with the [facility], please go directly to the [facility] and check in at the registration area.   Wear comfortable clothing and clothing appropriate for easy access to any Portacath or PICC line.   We strive to give you quality time with your provider. You may need to reschedule your appointment if you arrive late (15 or more minutes).  Arriving late affects you and other patients whose appointments are after yours.  Also, if you miss three or more appointments without notifying the office, you may be dismissed from the clinic at the provider's discretion.      For prescription refill requests, have your pharmacy contact our office and allow 72 hours for refills to be completed.    Today you received the following chemotherapy and/or immunotherapy agents Joseph Atkins      To help prevent nausea and vomiting after your treatment, we encourage you to take your nausea medication as directed.  BELOW ARE SYMPTOMS THAT SHOULD BE REPORTED IMMEDIATELY: *FEVER GREATER THAN 100.4 F (38 C) OR HIGHER *CHILLS OR SWEATING *NAUSEA AND VOMITING THAT IS NOT CONTROLLED WITH YOUR NAUSEA MEDICATION *UNUSUAL SHORTNESS OF BREATH *UNUSUAL BRUISING OR BLEEDING *URINARY PROBLEMS (pain or burning when urinating, or frequent urination) *BOWEL PROBLEMS (unusual diarrhea, constipation, pain near the anus) TENDERNESS IN MOUTH AND THROAT WITH OR WITHOUT PRESENCE OF ULCERS (sore throat, sores in mouth, or a toothache) UNUSUAL RASH, SWELLING OR PAIN  UNUSUAL VAGINAL DISCHARGE OR ITCHING   Items with * indicate a potential emergency and should be followed up as soon as possible or go to the Emergency Department if any problems should occur.  Please show the CHEMOTHERAPY ALERT CARD or IMMUNOTHERAPY ALERT CARD at check-in to  the Emergency Department and triage nurse.  Should you have questions after your visit or need to cancel or reschedule your appointment, please contact [facility]  Dept: [phone]  and follow the prompts.  Office hours are 8:00 a.m. to 4:30 p.m. Monday - Friday. Please note that voicemails left after 4:00 p.m. may not be returned until the following business day.  We are closed weekends and major holidays. You have access to a nurse at all times for urgent questions. Please call the main number to the clinic Dept: [phone] and follow the prompts.   For any non-urgent questions, you may also contact your provider using MyChart. We now offer e-Visits for anyone 73 and older to request care online for non-urgent symptoms. For details visit [email or web address].   Also download the MyChart app! Go to the app store, search "MyChart", open the app, select [facility], and log in with your MyChart username and password.  Due to Covid, a mask is required upon entering the hospital/clinic. If you do not have a mask, one will be given to you upon arrival. For doctor visits, patients may have 1 support person aged 24 or older with them. For treatment visits, patients cannot have anyone with them due to current Covid guidelines and our immunocompromised population.

## 2020-11-24 NOTE — Progress Notes (Signed)
[facility] [phone]   [phone]  OFFICE PROGRESS NOTE  [name], NP [name] [place] [address]  DIAGNOSIS: Stage IV (T3, N0, M1C) non-small cell lung cancer, adenocarcinoma.  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 He was diagnosed in July 2021.   Molecular Biomarkers:  MSI-High DETECTED Pembrolizumab Atezolizumab, Avelumab, Cemiplimab, Dostarlimab, Durvalumab, Ipilimumab, Nivolumab   STK11Splice Site SNV 7.7% Everolimus, Temsirolimus Yes   [id number] 1.7% Binimetinib Yes   [id number] 0.4%   Niraparib, Olaparib, Rucaparib, Talazoparib, Tazemetostat Yes   PRIOR THERAPY:  1) SRS to the solitary brain metastasis under the care of Dr. Lisbeth Renshaw. Last treatment 11/14/19. 2) Weekly concurrent chemoradiation with carboplatin for an AUC of 2, paclitaxel 45 mg/m2.  First dose expected on 11/25/2019. Status post 7 cycles, last dose was giving 01/06/2020 with partial response.    CURRENT THERAPY:  1)  Immunotherapy with Keytruda 200 mg IV every 3 weeks.  First dose February 10, 2020 for a patient with MSI high.  Status post 12 cycles. 2) Avastin 15 mg/KG every 3 weeks.  First dose today for the vasogenic edema of the brain.S/P 7 cycles.  INTERVAL HISTORY: Joseph Atkins 60 y.o. male returns to the clinic today for follow-up visit accompanied by his wife.  The patient is feeling fine today with no concerning complaints except for sleep disturbance.  He was treated with Ambien in the past but he is running out of his medication.  He denied having any current chest pain, shortness of breath, cough or hemoptysis.  He denied having any fever or chills.  He has no nausea, vomiting, diarrhea or constipation.  He has no headache or visual changes.  He continues to tolerate his treatment with Keytruda fairly well.  The patient is here today  for evaluation before starting cycle #13 of his treatment.  MEDICAL HISTORY: Past Medical History:  Diagnosis Date   Cancer ([facility])    lung cancer   Diverticulosis    GERD (gastroesophageal reflux disease)    Hx of small bowel obstruction    Hyperlipidemia    Hypertension    Hypothyroidism    IBS (irritable bowel syndrome)    Substance abuse ([facility])    Alcoholic, Drug addition   Thyroid disease     ALLERGIES:  is allergic to penicillins.  MEDICATIONS:  Current Outpatient Medications  Medication Sig Dispense Refill   albuterol (PROVENTIL HFA;VENTOLIN HFA) 108 (90 Base) MCG/ACT inhaler Inhale 2 puffs into the lungs every 6 (six) hours as needed for wheezing or shortness of breath. 1 Inhaler 0   ANORO ELLIPTA 62.5-25 MCG/INH AEPB 1 puff daily.     cyclobenzaprine (FLEXERIL) 10 MG tablet Take 10 mg by mouth 2 (two) times daily as needed for muscle spasms.     docusate sodium (COLACE) 100 MG capsule Take 100 mg by mouth daily.     DULoxetine (CYMBALTA) 20 MG capsule Take 1 capsule (20 mg total) by mouth 2 (two) times daily. 60 capsule 5   fluticasone furoate-vilanterol (BREO ELLIPTA) 100-25 MCG/INH AEPB Inhale 1 puff into the lungs daily. 30 each 3   levothyroxine (SYNTHROID) 100 MCG tablet TAKE 1 TABLET BY MOUTH EVERY DAY 90 tablet 1   lidocaine-prilocaine (EMLA) cream Apply 1 application topically as needed. 30 g 0   omeprazole (PRILOSEC) 40 MG capsule Take 1 capsule (40 mg total) by  mouth daily. 90 capsule 4   ondansetron (ZOFRAN) 4 MG tablet Take 1 tablet (4 mg total) by mouth every 8 (eight) hours as needed. 40 tablet 2   oxyCODONE-acetaminophen (PERCOCET/ROXICET) 5-325 MG tablet Take 1 tablet by mouth every 4 (four) hours as needed for severe pain. 30 tablet 0   polyethylene glycol powder (MIRALAX) powder Take 17 g by mouth daily. 255 g 11   prochlorperazine (COMPAZINE) 10 MG tablet Take 1 tablet (10 mg total) by mouth every 6 (six) hours as needed. 30 tablet 2   TRULANCE 3 MG TABS  Take 1 tablet by mouth daily.     zolpidem (AMBIEN CR) 12.5 MG CR tablet Take 1 tablet (12.5 mg total) by mouth at bedtime as needed for sleep. 30 tablet 5   No current facility-administered medications for this visit.    SURGICAL HISTORY:  Past Surgical History:  Procedure Laterality Date   arm surgery Right    BRONCHIAL BRUSHINGS  10/24/2019   Procedure: BRONCHIAL BRUSHINGS;  Surgeon: Collene Gobble, MD;  [facility] END[facility]COPY;  Service: Cardiopulmonary;;  right lower lobe    BRONCHIAL BRUSHINGS  11/05/2019   Procedure: BRONCHIAL BRUSHINGS;  Surgeon: Collene Gobble, MD;  Location: [facility] ENDOSCOPY;  Service: Pulmonary;;   BRONCHIAL NEEDLE ASPIRATION BIOPSY  10/24/2019   Procedure: BRONCHIAL NEEDLE ASPIRATION BIOPSIES;  Surgeon: Collene Gobble, MD;  Location: [facility];  Service: Cardiopulmonary;;   BRONCHIAL NEEDLE ASPIRATION BIOPSY  11/05/2019   Procedure: BRONCHIAL NEEDLE ASPIRATION BIOPSIES;  Surgeon: Collene Gobble, MD;  Location: [facility] ENDOSCOPY;  Service: Pulmonary;;   ENDOBRONCHIAL ULTRASOUND N/A 10/24/2019   Procedure: ENDOBRONCHIAL ULTRASOUND;  Surgeon: Collene Gobble, MD;  Location: [facility];  Service: Cardiopulmonary;  Laterality: N/A;   FINGER SURGERY Right    Middle   IR IMAGING GUIDED PORT INSERTION  11/19/2019   VIDEO BRONCHOSCOPY N/A 10/24/2019   Procedure: VIDEO BRONCHOSCOPY WITHOUT FLUORO;  Surgeon: Collene Gobble, MD;  Location: [facility];  Service: Cardiopulmonary;  Laterality: N/A;   VIDEO BRONCHOSCOPY WITH ENDOBRONCHIAL NAVIGATION N/A 11/05/2019   Procedure: VIDEO BRONCHOSCOPY WITH ENDOBRONCHIAL NAVIGATION;  Surgeon: Collene Gobble, MD;  Location: [facility] ENDOSCOPY;  Service: Pulmonary;  Laterality: N/A;    REVIEW OF SYSTEMS:  A comprehensive review of systems was negative except for: Behavioral/Psych: positive for sleep disturbance   PHYSICAL EXAMINATION: General appearance: alert, cooperative, and no distress Head: Normocephalic, without obvious abnormality,  atraumatic Neck: no adenopathy, no JVD, supple, symmetrical, trachea midline, and thyroid not enlarged, symmetric, no tenderness/mass/nodules Lymph nodes: Cervical, supraclavicular, and axillary nodes normal. Resp: clear to auscultation bilaterally Back: symmetric, no curvature. ROM normal. No CVA tenderness. Cardio: regular rate and rhythm, S1, S2 normal, no murmur, click, rub or gallop GI: soft, non-tender; bowel sounds normal; no masses,  no organomegaly Extremities: extremities normal, atraumatic, no cyanosis or edema  ECOG PERFORMANCE STATUS: 1 - Symptomatic but completely ambulatory  Blood pressure 132/79, pulse 85, temperature (!) 97.4 F (36.3 C), temperature source Tympanic, resp. rate 19, height _0  (1.727 m), weight 150 lb 14.4 oz (68.4 kg), SpO2 100 %.  LABORATORY DATA: Lab Results  Component Value Date   WBC 8.9 11/24/2020   HGB 12.4 (L) 11/24/2020   HCT 37.1 (L) 11/24/2020   MCV 93.9 11/24/2020   PLT 265 11/24/2020      Chemistry      Component Value Date/Time   NA 137 11/03/2020 0824   NA 141 03/08/2017 1707   K 3.8 11/03/2020 6720  CL 104 11/03/2020 0824   CO2 23 11/03/2020 0824   BUN 6 11/03/2020 0824   BUN 12 03/08/2017 1707   CREATININE 0.75 11/03/2020 0824      Component Value Date/Time   CALCIUM 8.3 (L) 11/03/2020 0824   ALKPHOS 96 11/03/2020 0824   AST 31 11/03/2020 0824   ALT 21 11/03/2020 0824   BILITOT 0.3 11/03/2020 0824       RADIOGRAPHIC STUDIES: No results found.   ASSESSMENT AND PLAN: This is a very pleasant 60 years old white male with a stage IV (t3, N0, M1c) non-small cell lung cancer, adenocarcinoma with MSI high presented with right lower lobe/infrahilar mass in addition to solitary brain metastasis in the left cerebellum diagnosed in July 2021. He is status post SRS to the solitary brain metastasis. The patient completed a course of concurrent chemoradiation with weekly carboplatin and paclitaxel.  He tolerated the treatment  well except for fatigue and mild odynophagia. He had repeat CT scan of the chest performed recently.  I personally and independently reviewed the scans and discussed the results with the patient and his wife. His scan showed interval decrease in the volume of the right infrahilar mass with no other evidence of metastatic disease. The patient has MSI high and I recommended for him treatment with immunotherapy with single agent Keytruda 200 mg IV every 3 weeks for a total of 2 years unless the patient has unacceptable toxicity or disease progression. He is status post 12 cycles of treatment with Keytruda.  He also received 7 cycles of Avastin for the vasogenic edema in the brain.   Avastin will be on hold for now unless needed in the future. The patient continues to tolerate this treatment with [facility] fairly well. I recommended for him to proceed with cycle #13 today as planned. I will see him back for follow-up visit in 3 weeks for evaluation with repeat CT scan of the chest, abdomen pelvis for restaging of his disease. For the insomnia, he will try over-the-counter melatonin or Tylenol PM. For the metastatic brain lesions, he is followed by Dr. Mickeal Skinner and Dr. Lisbeth Renshaw.   The patient was advised to call immediately if he has any concerning symptoms in the interval.  The patient voices understanding of current disease status and treatment options and is in agreement with the current care plan.  All questions were answered. The patient knows to call the clinic with any problems, questions or concerns. We can certainly see the patient much sooner if necessary.  Disclaimer: This note was dictated with voice recognition software. Similar sounding words can inadvertently be transcribed and may not be corrected upon review.

## 2020-12-11 ENCOUNTER — Ambulatory Visit
Admission: RE | Admit: 2020-12-11 | Discharge: 2020-12-11 | Disposition: A | Discharge status: Home / Self Care | Payer: 59 | Source: Ambulatory Visit | Attending: Radiation Oncology | Admitting: Radiation Oncology

## 2020-12-11 ENCOUNTER — Other Ambulatory Visit: Payer: Self-pay

## 2020-12-11 ENCOUNTER — Encounter: Payer: Self-pay | Admitting: Radiation Oncology

## 2020-12-11 ENCOUNTER — Ambulatory Visit (HOSPITAL_COMMUNITY)
Admission: RE | Admit: 2020-12-11 | Discharge: 2020-12-11 | Disposition: A | Discharge status: Home / Self Care | Payer: 59 | Source: Ambulatory Visit | Attending: Internal Medicine | Admitting: Internal Medicine

## 2020-12-11 DIAGNOSIS — C7931 Secondary malignant neoplasm of brain: Secondary | ICD-10-CM

## 2020-12-11 DIAGNOSIS — C3490 Malignant neoplasm of unspecified part of unspecified bronchus or lung: Secondary | ICD-10-CM | POA: Insufficient documentation

## 2020-12-11 IMAGING — CT CT ABD-PELV W/ CM
2 of 5 series · 13 of 36 positions shown, 16 images · IV contrast (omnipaque)
Comparison: [DATE]

CLINICAL DATA: Non-small cell lung cancer staging in a 60-year-old
male, ongoing immunotherapy with prior radiation and chemotherapy

EXAM:
CT CHEST, ABDOMEN, AND PELVIS WITH CONTRAST
TECHNIQUE: Multidetector CT imaging of the chest, abdomen and pelvis was
performed following the standard protocol during bolus
administration of intravenous contrast.
CONTRAST:  85mL OMNIPAQUE IOHEXOL 350 MG/ML SOLN

[Series 2: cap with · axial · 0.86mm/px · z∈[+892,+1457]mm · 10 of 139 slices shown, 13 images]
[im 13/139  mediastinal]
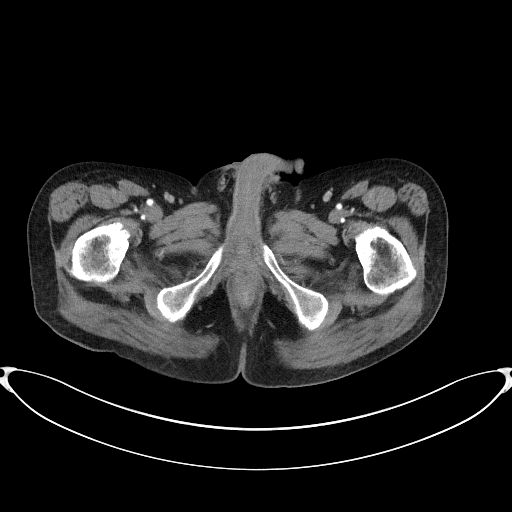
[im 13/139  lung]
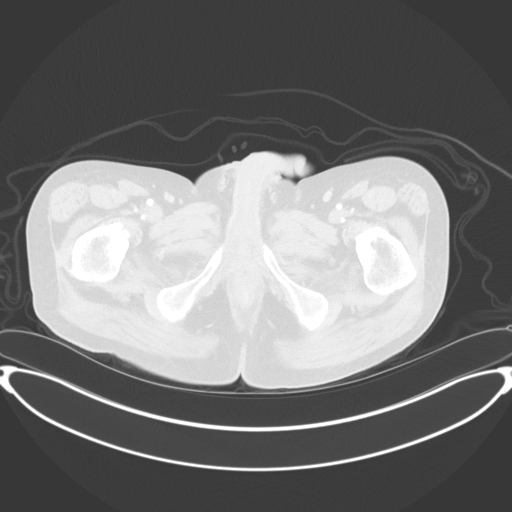
[im 26/139  lung]
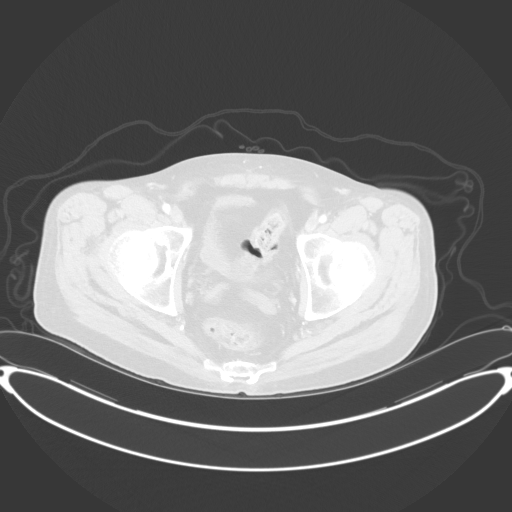
[im 38/139  lung]
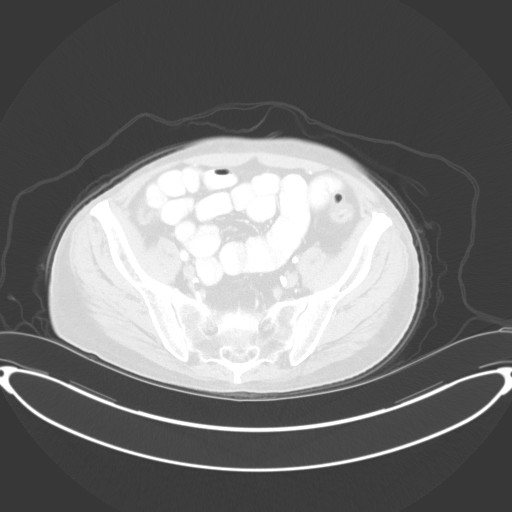
[im 51/139  lung]
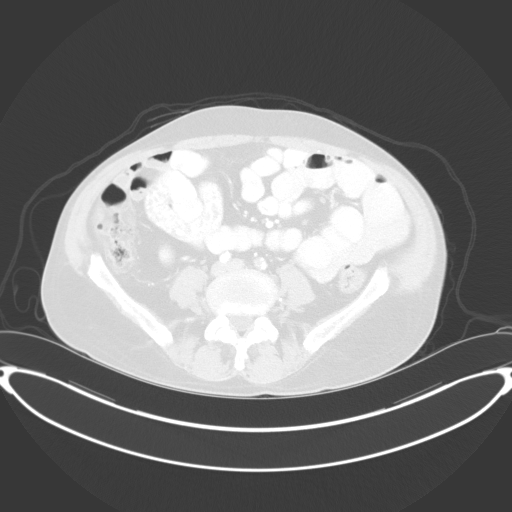
[im 63/139  mediastinal]
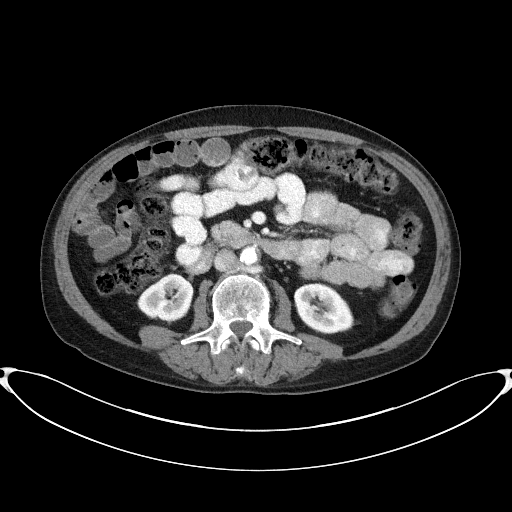
[im 63/139  lung]
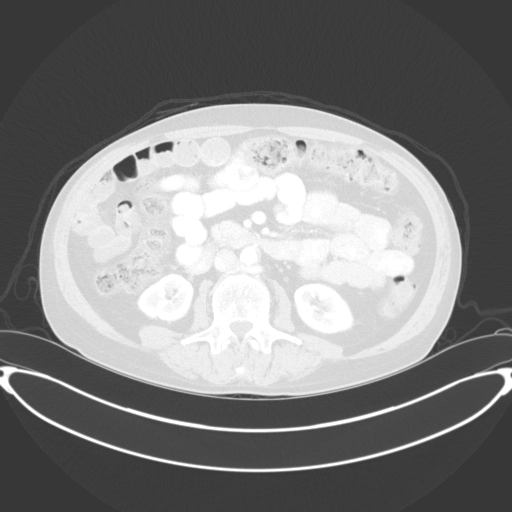
[im 76/139  lung]
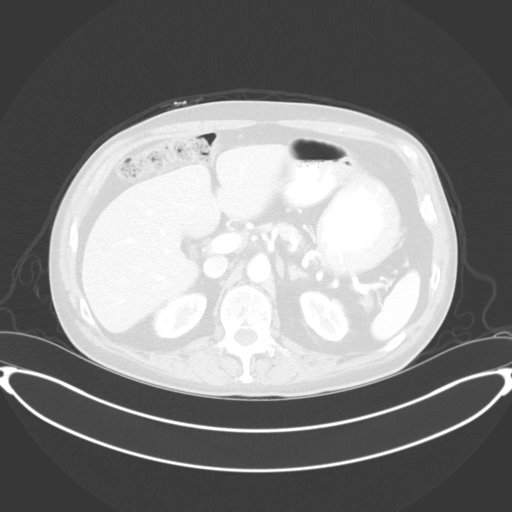
[im 88/139  lung]
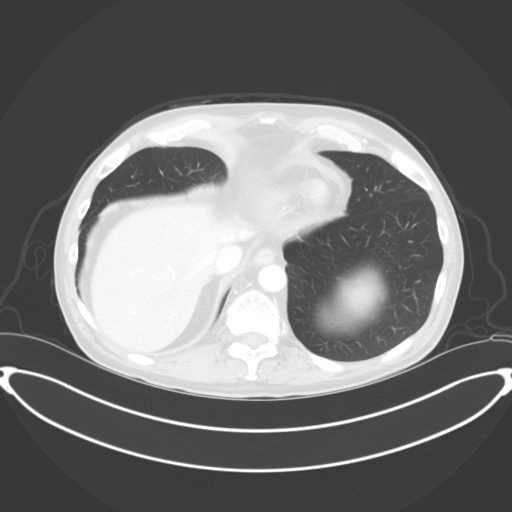
[im 101/139  lung]
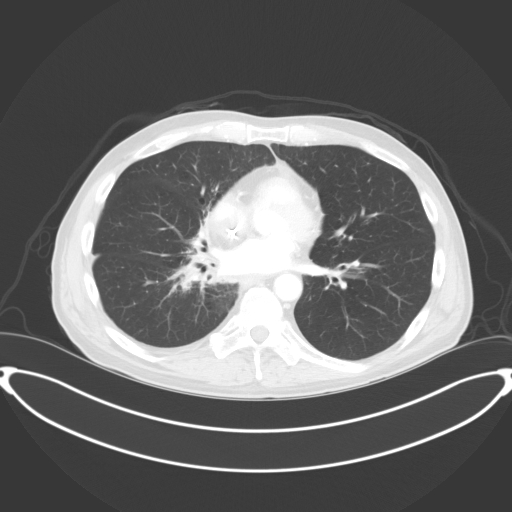
[im 113/139  mediastinal]
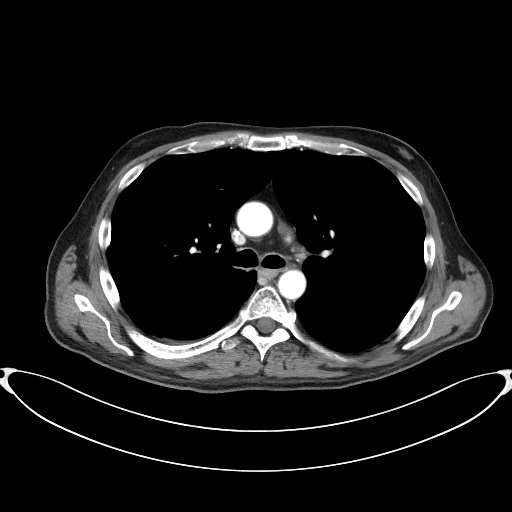
[im 113/139  lung]
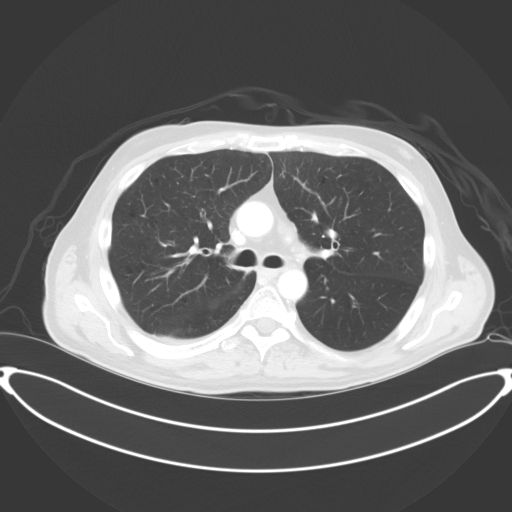
[im 126/139  lung]
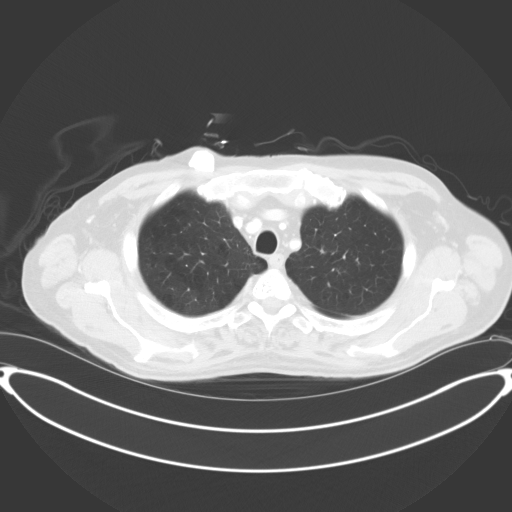

[Series 5: coronals · coronal · 0.91mm/px · 3 of 149 slices shown]
[im 30/149  lung]
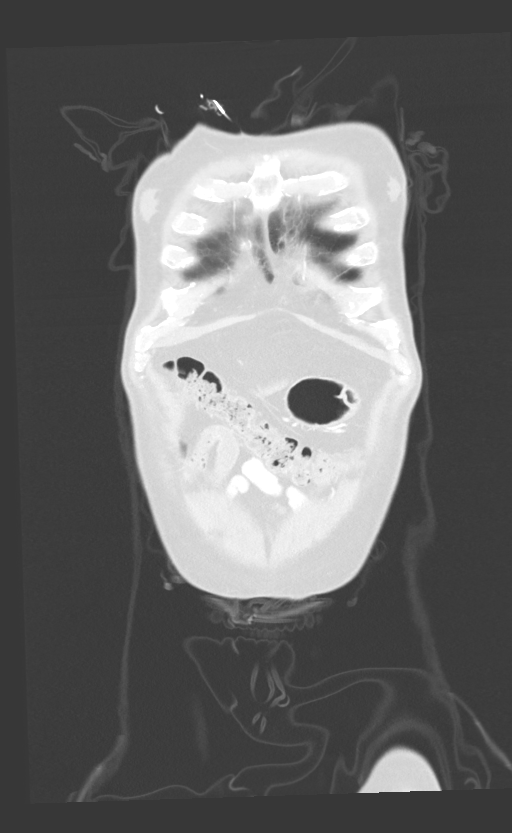
[im 60/149  lung]
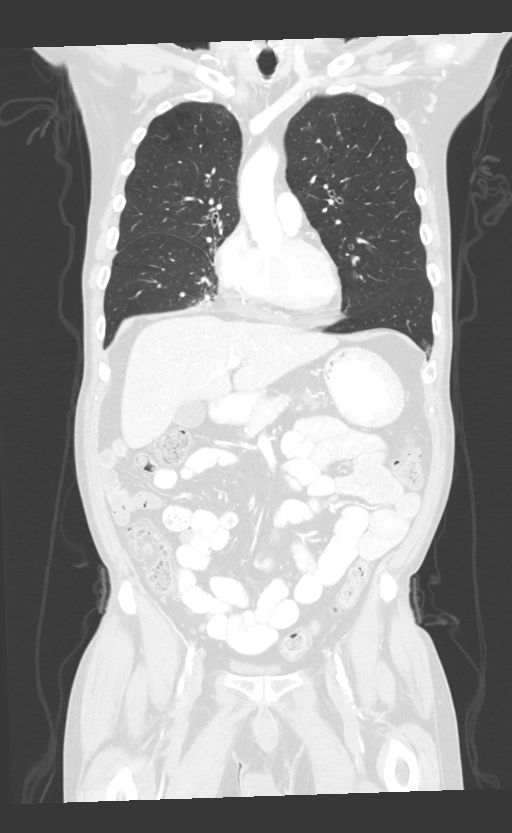
[im 89/149  lung]
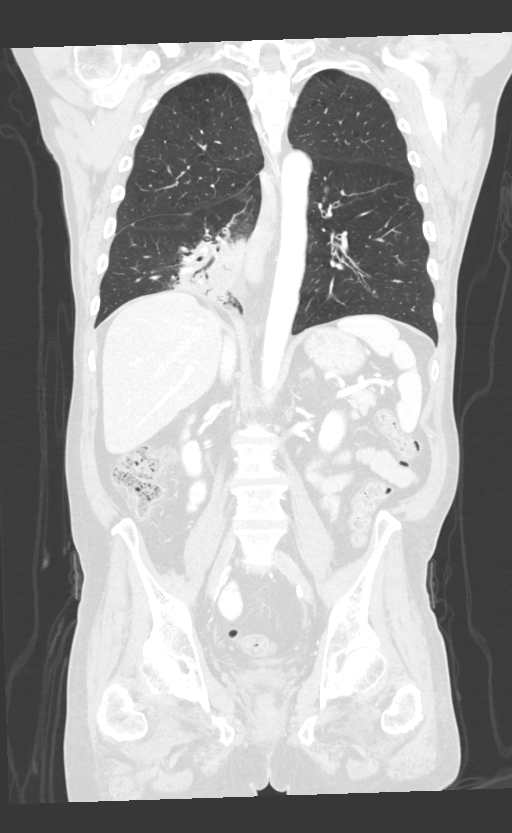

[13 of 36 positions shown; findings below may reference images not displayed]

FINDINGS: CT CHEST FINDINGS

Cardiovascular: RIGHT-sided Port-A-Cath terminates at the caval to
atrial junction. Aortic caliber is normal with scattered
atherosclerotic changes. Normal heart size with small pericardial
effusion similar to slightly increased compared to the prior exam.
Central pulmonary vasculature is unremarkable on venous phase
assessment.

Mediastinum/Nodes: No axillary, thoracic inlet, mediastinal or hilar
lymphadenopathy.

Lungs/Pleura: Pulmonary emphysema. Masslike area in the RIGHT lower
lobe contiguous with RIGHT hilar structures (image 110/4) 5.1 x
cm previously 5.9 x 4.5 cm. Small RIGHT-sided pleural effusion
slightly increased.

No new or suspicious pulmonary nodules elsewhere in the chest. Mild
bronchial wall thickening.

Musculoskeletal: See below for full musculoskeletal details. No
acute bone finding or destructive bone process.

CT ABDOMEN PELVIS FINDINGS

Hepatobiliary: Central low-density hepatic lesion in the lateral
segment of the LEFT hepatic lobe. Stable since at least [DATE]. Background hepatic steatosis. No new hepatic lesions. The
portal vein is patent. Hepatic veins are patent.

Pancreas: Normal, without mass, inflammation or ductal dilatation.

Spleen: Normal.

Adrenals/Urinary Tract: Adrenal glands are normal.

Symmetric renal enhancement. No hydronephrosis. No perivesical
stranding. Urinary bladder under distended.

Stomach/Bowel: Mild distension of small bowel with ingested contrast
passing distally without signs of discrete transition. Stool like
material in the lumen. Moderate stool seen throughout the colon. No
signs of perienteric stranding. Stomach without acute process.

Vascular/Lymphatic: Calcified and noncalcified atheromatous plaque
of the abdominal aorta. Chronic appearing occlusion unchanged of
LEFT common iliac artery. There is no gastrohepatic or
hepatoduodenal ligament lymphadenopathy. No retroperitoneal or
mesenteric lymphadenopathy.

No pelvic sidewall lymphadenopathy.

Reproductive: Unremarkable

Other: No free air.  No ascites.

Musculoskeletal: No acute bone finding. No destructive bone process.
IMPRESSION: 1. Masslike area in the RIGHT lower lobe contiguous with RIGHT hilar
structures is slightly diminished in size, associated with
surrounding post treatment changes. No new or progressive findings.
2. Slight increase in small pericardial and RIGHT pleural effusion.
3. Chronic occlusion of LEFT common iliac artery as before.
4. Mild to moderate dilation of small bowel without discrete
transition and without signs of adjacent inflammation. This could be
related to delayed enteric transit and or developing ileus.
Correlate with any abdominal symptoms.
5. Emphysema and aortic atherosclerosis.

## 2020-12-11 IMAGING — MR MR HEAD WO/W CM
12 series · 48 of 48 positions shown · IV contrast (multihance)
Comparison: [DATE]

CLINICAL DATA: Follow-up SRS to the brain

EXAM:
MRI HEAD WITHOUT AND WITH CONTRAST
TECHNIQUE: Multiplanar, multiecho pulse sequences of the brain and surrounding
structures were obtained without and with intravenous contrast.
CONTRAST:  14mL MULTIHANCE GADOBENATE DIMEGLUMINE 529 MG/ML IV SOLN

[Series 2: FLAIR · sagittal · 3.0mm · 0.75mm/px · 3 of 39 slices shown (1 of 2)]
[im 1/39]
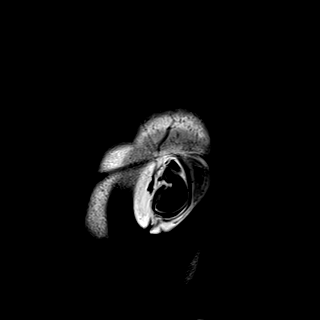
[im 20/39]
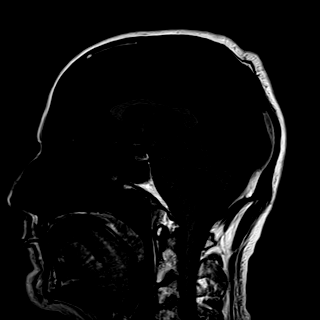
[im 39/39]
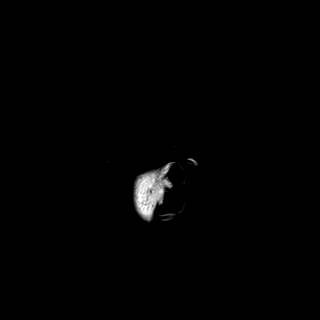

[Series 3: DWI · axial · 3.0mm · 1.50mm/px · z∈[-50,+95]mm · 4 of 78 slices shown (1 of 2)]
[im 1/78]
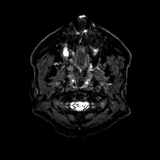
[im 26/78]
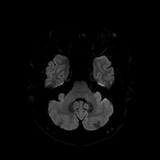
[im 52/78]
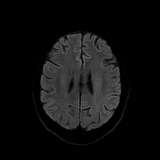
[im 78/78]
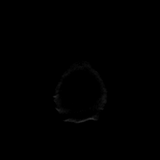

[Series 4: DWI · axial · 3.0mm · 1.50mm/px · z∈[-50,+95]mm · 2 of 38 slices shown (2 of 2)]
[im 1/38]
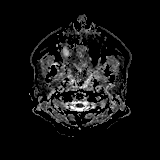
[im 38/38]
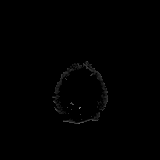

[Series 5: T2 · axial · 5.0mm · 0.57mm/px · 1 of 27 slices shown (1 of 2)]
[im 1/27]
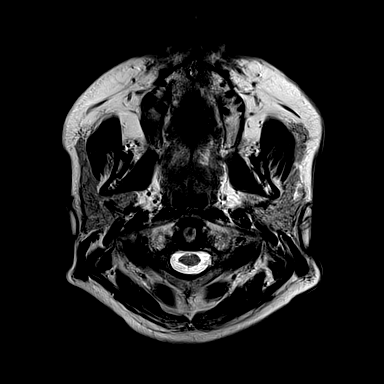

[Series 6: swi_images · axial · 1.5mm · 0.90mm/px · z∈[-48,+89]mm · 5 of 95 slices shown]
[im 1/95]
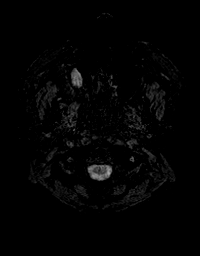
[im 24/95]
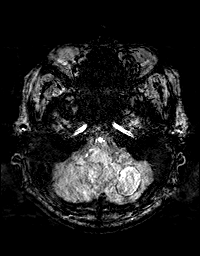
[im 48/95]
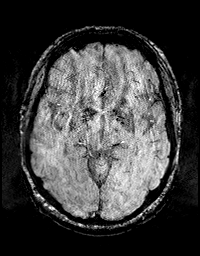
[im 71/95]
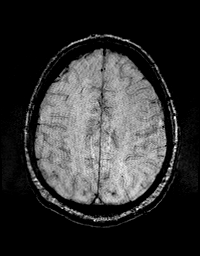
[im 95/95]
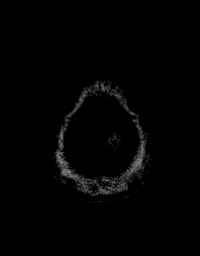

[Series 8: FLAIR · axial · 3.0mm · 0.86mm/px · z∈[-87,+81]mm · 3 of 57 slices shown (2 of 2)]
[im 1/57]
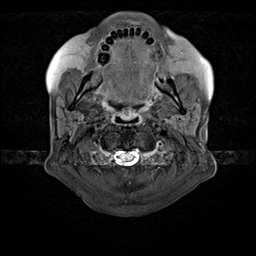
[im 29/57]
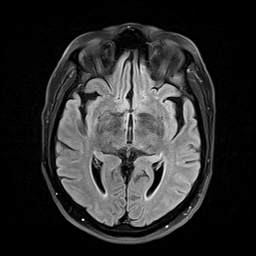
[im 57/57]
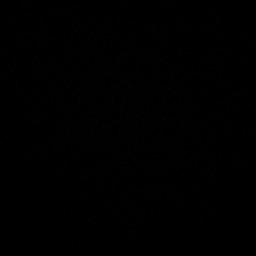

[Series 9: T2 · axial · non-contrast · 1.0mm · 0.86mm/px · z∈[-80,+76]mm · 8 of 160 slices shown (2 of 2)]
[im 1/160]
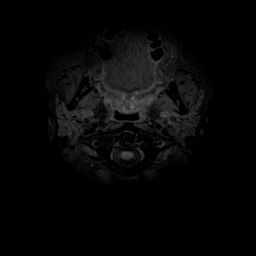
[im 23/160]
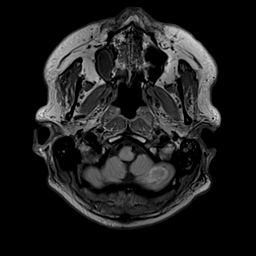
[im 46/160]
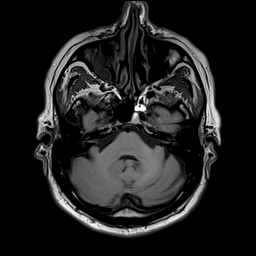
[im 69/160]
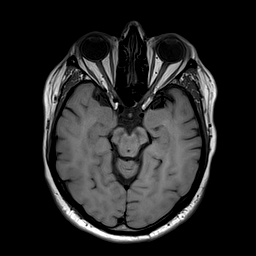
[im 91/160]
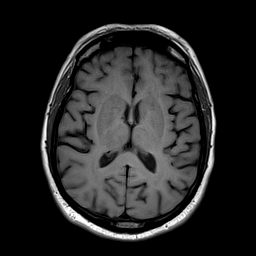
[im 114/160]
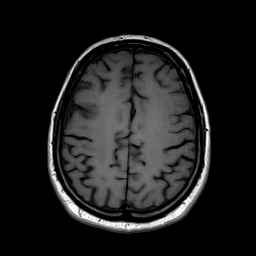
[im 137/160]
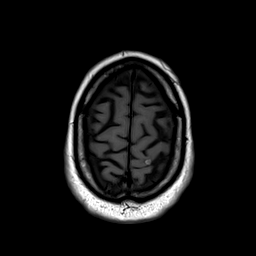
[im 160/160]
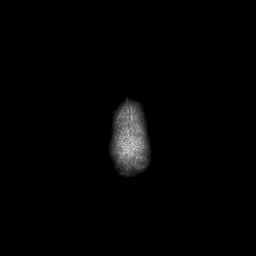

[Series 10: T2 post-contrast · coronal · 3.0mm · 0.57mm/px · 2 of 47 slices shown (1 of 2)]
[im 1/47]
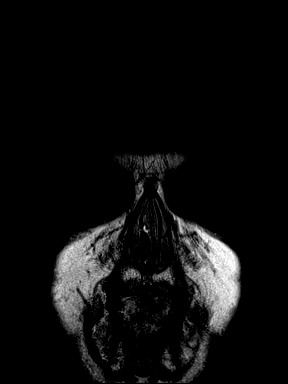
[im 47/47]
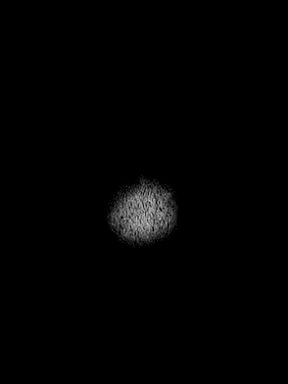

[Series 11: T2 post-contrast · axial · 1.0mm · 0.86mm/px · z∈[-80,+76]mm · 8 of 160 slices shown (2 of 2)]
[im 1/160]
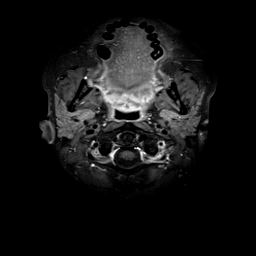
[im 23/160]
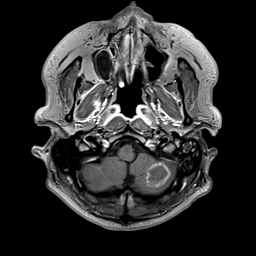
[im 46/160]
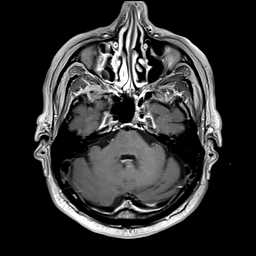
[im 69/160]
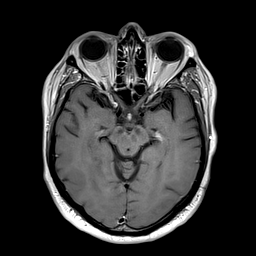
[im 91/160]
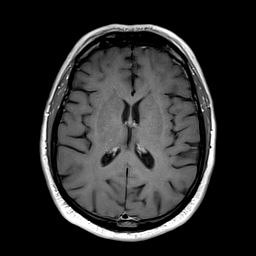
[im 114/160]
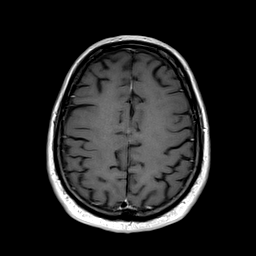
[im 137/160]
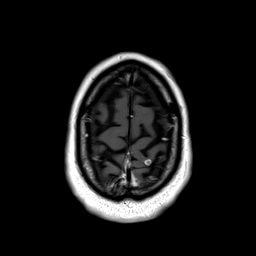
[im 160/160]
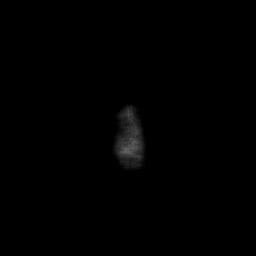

[Series 12: T1 post-contrast · axial · 1.0mm · 0.75mm/px · z∈[-81,+78]mm · 8 of 160 slices shown (1 of 2)]
[im 1/160]
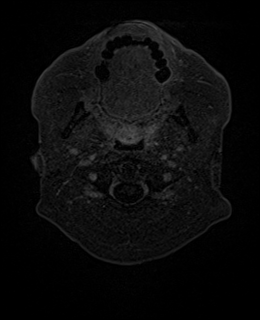
[im 23/160]
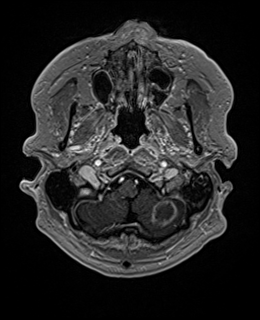
[im 46/160]
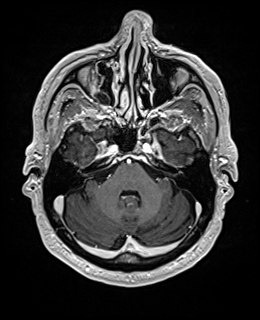
[im 69/160]
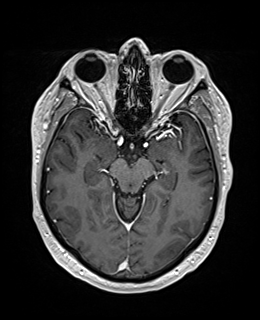
[im 91/160]
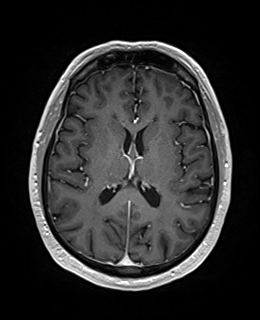
[im 114/160]
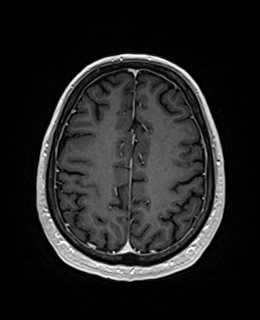
[im 137/160]
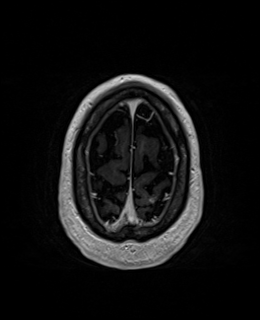
[im 160/160]
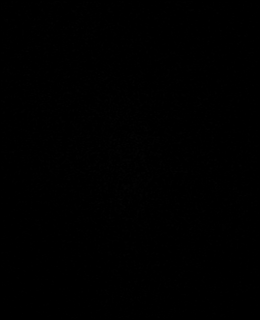

[Series 13: T1 post-contrast · coronal · 3.0mm · 0.57mm/px · 2 of 47 slices shown (2 of 2)]
[im 1/47]
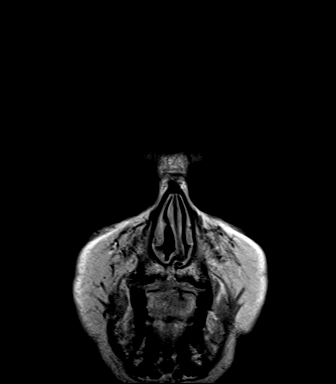
[im 47/47]
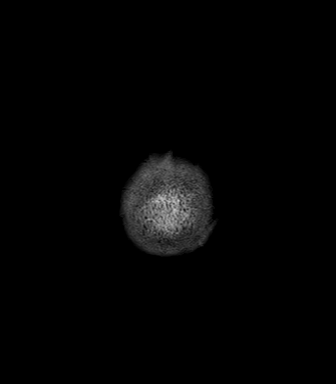

[Series 14: FLAIR post-contrast · sagittal · 3.0mm · 0.75mm/px · 2 of 39 slices shown]
[im 1/39]
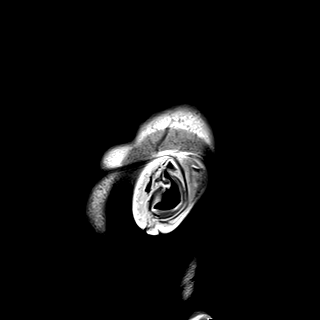
[im 39/39]
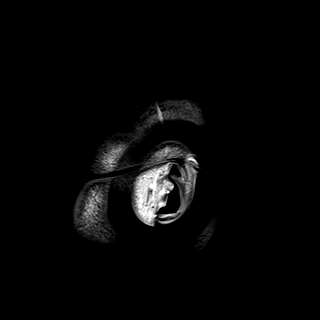

[48 of 48 positions shown; findings below may reference images not displayed]

FINDINGS: BRAIN

New Lesions: None.

Larger lesions: None.

Stable or Smaller lesions:

1. 28 mm lesion in the left cerebellum with T1 shortening primarily
from mineralization based on precontrast imaging.
2. Punctate left parasagittal occipital nodule on the 11: 66
3. Left frontal parietal cortex subcentimeter nodule on [DATE]
4. Additional previously identified metastases are no longer
detectable.

Other Brain findings: Ovoid focus of subcentimeter restricted
diffusion in the right pons without enhancement. This will be
reported to clinical team at Brain tumor Conference tomorrow. No
acute hemorrhage, hydrocephalus, or collection.

Vascular: Preserved flow voids and vascular enhancements. Small
developmental venous anomalies in the superior right frontal lobe
and left upper cerebellum.

Skull and upper cervical spine: No evidence of marrow lesion.

Sinuses/Orbits: No acute finding. Retention cyst in the right
maxillary sinus.
IMPRESSION: 1. No new or progressive disease.
2. Incidental acute or subacute infarct in the right pons.

## 2020-12-11 MED ORDER — IOHEXOL 350 MG/ML SOLN
85.0000 mL | Freq: Once | INTRAVENOUS | Status: AC | PRN
  Administered 2020-12-11: 85 mL via INTRAVENOUS

## 2020-12-11 MED ORDER — GADOBENATE DIMEGLUMINE 529 MG/ML IV SOLN
14.0000 mL | Freq: Once | INTRAVENOUS | Status: AC | PRN
  Administered 2020-12-11: 14 mL via INTRAVENOUS

## 2020-12-11 MED ORDER — HEPARIN SOD (PORK) LOCK FLUSH 100 UNIT/ML IV SOLN
500.0000 [IU] | Freq: Once | INTRAVENOUS | Status: AC
  Administered 2020-12-11: 500 [IU] via INTRAVENOUS

## 2020-12-11 MED ORDER — SODIUM CHLORIDE 0.9% FLUSH
10.0000 mL | INTRAVENOUS | Status: DC | PRN
  Administered 2020-12-11: 10 mL via INTRAVENOUS

## 2020-12-11 MED ORDER — HEPARIN SOD (PORK) LOCK FLUSH 100 UNIT/ML IV SOLN
INTRAVENOUS | Status: AC
  Filled 2020-12-11: qty 5

## 2020-12-11 NOTE — Progress Notes (Signed)
Patient reports mild coughing (smoker), no pain, fatigue, shortness of breath, no painful swallowing, choking, weight loss, and a good appetite.  Meaningful use questions complete and patient notified of his 3:30pm telephone appointment on 12/14/20 and expressed understanding.

## 2020-12-14 ENCOUNTER — Ambulatory Visit
Admission: RE | Admit: 2020-12-14 | Discharge: 2020-12-14 | Disposition: A | Discharge status: Home / Self Care | Payer: 59 | Source: Ambulatory Visit | Attending: Radiation Oncology | Admitting: Radiation Oncology

## 2020-12-14 ENCOUNTER — Inpatient Hospital Stay: Payer: 59

## 2020-12-14 DIAGNOSIS — C3491 Malignant neoplasm of unspecified part of right bronchus or lung: Secondary | ICD-10-CM

## 2020-12-14 DIAGNOSIS — C7931 Secondary malignant neoplasm of brain: Secondary | ICD-10-CM

## 2020-12-14 NOTE — Progress Notes (Signed)
Radiation Oncology         [phone] ________________________________  Outpatient Follow Up - Conducted via telephone due to current COVID-19 concerns for limiting patient exposure  I spoke with the patient to conduct this consult visit via telephone to spare the patient unnecessary potential exposure in the healthcare setting during the current COVID-19 pandemic. The patient was notified in advance and was offered a [facility] meeting to allow for face to face communication but unfortunately reported that they did not have the appropriate resources/technology to support such a visit and instead preferred to proceed with a telephone visit.   Name: Joseph Atkins MRN: [id number]  Date of Service: 12/14/2020  DOB: 1960/12/01  Follow Up   Diagnosis:  Stage IV, cT3, N0, M1c, non-small cell lung cancer, adenocarcinoma of the right lower lobe  Interval Since Last Radiation:  6 months  06/19/2020 SRS Treatment: Each site was treated to 20 Gy in 1 fraction PTV_14 Rt Cereb 30mm PTV_13 Rt Occ 92mm PTV_12 Rt Occ 63mm PTV_11 Lt Front 71mm PTV_10 Rt Par 57mm PTV_9 Lt Par 83mm  03/05/20 SRS Treatment: The following lesions were treated to 20 Gy in 1 fraction PTV2 Rt Temp 47mm PTV3 Lt Occ 67mm PTV4 Lt Occ 24mm PTV5 Rt Temp 26mm PTV6 Lt Occ 54mm PTV7 Rt Par 54mm PTV8 Lt Par 68mm  11/25/19 - 01/09/20: The patient was treated to the disease within the right lung initially to a dose of 60 Gy using a 4 field, 3-D conformal technique. The patient then received a cone down boost treatment for an additional 6 Gy. This yielded a final total dose of 66 Gy.   11/14/19 SRS Treatment:  PTV1 Lt Cerebellum 28 mm, 18 Gy in 1 fraction  Narrative: This is a pleasant 60 y.o. male with a history of stage IV non-small cell lung cancer, adenocarcinoma arising in the right lower lobe with what was originally a solitary brain metastasis.  He was treated with stereotactic radiosurgery [facility]) in July 2021, and definitive  chemoradiation to the lung which he completed in September 2021.  He has been receiving consolidative Keytruda with Dr. Julien Nordmann.  He  had a difficult time with trying to taper his steroids with what resulted in rebound edema following the tapering or discontinuing certain doses, and having to go back to steroids. He had SRS to new disease sites in November 2021 and in February 2022.  Since his last visit he had an MRI on 12/11/20 that showed no new or progressive disease and stability in the largest of his previously treated disease in the left cerebellum. He had an incidental change in the right pons felt to be either acute or subacute infarct. He's contacted today to discuss these results.   On review of systems, the patient reports that he is really doing well. He denies any nausea, vomiting, headaches, seizures, dizziness or changes in speech, movement, vision or hearing. He reports he is still having itchy dry skin in his back and that using Eucerin cream seems to help tremendously. He has had better control of his bowels and uses cathartics less often now. No other complaints are verbalized.      PAST MEDICAL HISTORY:  Past Medical History:  Diagnosis Date   Cancer ([facility])    lung cancer   Diverticulosis    GERD (gastroesophageal reflux disease)    Hx of small bowel obstruction    Hyperlipidemia    Hypertension    Hypothyroidism    IBS (irritable bowel  syndrome)    Substance abuse (HCC)    Alcoholic, Drug addition   Thyroid disease     PAST SURGICAL HISTORY: Past Surgical History:  Procedure Laterality Date   arm surgery Right    BRONCHIAL BRUSHINGS  10/24/2019   Procedure: BRONCHIAL BRUSHINGS;  Surgeon: Collene Gobble, MD;  Location: [facility] ENDOSCOPY;  Service: Cardiopulmonary;;  right lower lobe    BRONCHIAL BRUSHINGS  11/05/2019   Procedure: BRONCHIAL BRUSHINGS;  Surgeon: Collene Gobble, MD;  Location: [facility] ENDOSCOPY;  Service: Pulmonary;;   BRONCHIAL NEEDLE ASPIRATION BIOPSY   10/24/2019   Procedure: BRONCHIAL NEEDLE ASPIRATION BIOPSIES;  Surgeon: Collene Gobble, MD;  Location: [place];  Service: Cardiopulmonary;;   BRONCHIAL NEEDLE ASPIRATION BIOPSY  11/05/2019   Procedure: BRONCHIAL NEEDLE ASPIRATION BIOPSIES;  Surgeon: Collene Gobble, MD;  Location: [facility] ENDOSCOPY;  Service: Pulmonary;;   ENDOBRONCHIAL ULTRASOUND N/A 10/24/2019   Procedure: ENDOBRONCHIAL ULTRASOUND;  Surgeon: Collene Gobble, MD;  Location: [place];  Service: Cardiopulmonary;  Laterality: N/A;   FINGER SURGERY Right    Middle   IR IMAGING GUIDED PORT INSERTION  11/19/2019   VIDEO BRONCHOSCOPY N/A 10/24/2019   Procedure: VIDEO BRONCHOSCOPY WITHOUT FLUORO;  Surgeon: Collene Gobble, MD;  Location: [place];  Service: Cardiopulmonary;  Laterality: N/A;   VIDEO BRONCHOSCOPY WITH ENDOBRONCHIAL NAVIGATION N/A 11/05/2019   Procedure: VIDEO BRONCHOSCOPY WITH ENDOBRONCHIAL NAVIGATION;  Surgeon: Collene Gobble, MD;  Location: [place] ENDOSCOPY;  Service: Pulmonary;  Laterality: N/A;    PAST SOCIAL HISTORY:  Social History   Socioeconomic History   Marital status: Married    Spouse name: Not on file   Number of children: 2   Years of education: Not on file   Highest education level: Not on file  Occupational History   Occupation: supervisor    Employer: [employer]  Tobacco Use   Smoking status: Every Day    Packs/day: 1.50    Types: Cigarettes   Smokeless tobacco: Former    Types: Nurse, children's Use: Never used  Substance and Sexual Activity   Alcohol use: No    Comment: Alcoholic clean for 6 years   Drug use: No    Comment: Recovering Drug Addict-clean for 6 years   Sexual activity: Not on file  Other Topics Concern   Not on file  Social History Narrative   Not on file   Social Determinants of Health   Financial Resource Strain: Not on file  Food Insecurity: Not on file  Transportation Needs: Not on file  Physical Activity: Not on file  Stress: Not on file   Social Connections: Not on file  Intimate Partner Violence: Not on file  The patient is married and lives in [place].   PAST FAMILY HISTORY: Family History  Problem Relation Age of Onset   Epilepsy Mother    Heart disease Mother    Heart disease Brother    Lung cancer Paternal Uncle     MEDICATIONS  Current Outpatient Medications  Medication Sig Dispense Refill   albuterol (PROVENTIL HFA;VENTOLIN HFA) 108 (90 Base) MCG/ACT inhaler Inhale 2 puffs into the lungs every 6 (six) hours as needed for wheezing or shortness of breath. 1 Inhaler 0   ANORO ELLIPTA 62.5-25 MCG/INH AEPB 1 puff daily.     docusate sodium (COLACE) 100 MG capsule Take 100 mg by mouth daily.     DULoxetine (CYMBALTA) 20 MG capsule Take 1 capsule (20 mg total) by mouth 2 (two) times  daily. 60 capsule 5   fluticasone furoate-vilanterol (BREO ELLIPTA) 100-25 MCG/INH AEPB Inhale 1 puff into the lungs daily. 30 each 3   levothyroxine (SYNTHROID) 100 MCG tablet TAKE 1 TABLET BY MOUTH EVERY DAY 90 tablet 1   lidocaine-prilocaine (EMLA) cream Apply 1 application topically as needed. 30 g 0   omeprazole (PRILOSEC) 40 MG capsule Take 1 capsule (40 mg total) by mouth daily. 90 capsule 4   polyethylene glycol powder (MIRALAX) powder Take 17 g by mouth daily. 255 g 11   TRULANCE 3 MG TABS Take 1 tablet by mouth daily.     cyclobenzaprine (FLEXERIL) 10 MG tablet Take 10 mg by mouth 2 (two) times daily as needed for muscle spasms. (Patient not taking: Reported on 11/24/2020)     ondansetron (ZOFRAN) 4 MG tablet Take 1 tablet (4 mg total) by mouth every 8 (eight) hours as needed. (Patient not taking: No sig reported) 40 tablet 2   oxyCODONE-acetaminophen (PERCOCET/ROXICET) 5-325 MG tablet Take 1 tablet by mouth every 4 (four) hours as needed for severe pain. (Patient not taking: No sig reported) 30 tablet 0   prochlorperazine (COMPAZINE) 10 MG tablet Take 1 tablet (10 mg total) by mouth every 6 (six) hours as needed. (Patient not  taking: No sig reported) 30 tablet 2   zolpidem (AMBIEN CR) 12.5 MG CR tablet Take 1 tablet (12.5 mg total) by mouth at bedtime as needed for sleep. (Patient not taking: No sig reported) 30 tablet 5   No current facility-administered medications for this encounter.    ALLERGIES:  Allergies  Allergen Reactions   Penicillins Other (See Comments)    Childhood allergy.  Has patient had a PCN reaction causing immediate rash, facial/tongue/throat swelling, SOB or lightheadedness with hypotension: unknown Has patient had a PCN reaction causing severe rash involving mucus membranes or skin necrosis: unknown Has patient had a PCN reaction that required hospitalization: unknown Has patient had a PCN reaction occurring within the last 10 years: no If all of the above answers are "NO", then may proceed with Cephalosporin use.    Physical exam: Unable to assess given encounter type.  Impression/Plan: 1. Stage IV, cT3, N0, M1c, non-small cell lung cancer, adenocarcinoma of the right lower lobe with progressive brain disease.  The patient's MRI was reviewed, fortunately radiographically and clinically he is doing very well. We will follow up with him in 3 months for repeat imaging and he will continue with Keytruda under the care of Dr. Julien Nordmann.  2. MRI changes in the pons. The patient has been asymptomatic and will keep [name] informed of any neurologic changes or symptoms. This will otherwise be followed expectantly during his next MRI.   Given current concerns for patient exposure during the COVID-19 pandemic, this encounter was conducted via telephone.  The patient has provided two factor identification and has given verbal consent for this type of encounter and has been advised to only accept a meeting of this type in a secure network environment. The time spent during this encounter was 40 minutes including preparation, discussion, and coordination of the patient's care. The attendants for this  meeting include Hayden Pedro  and SABIR CHARTERS and his wife Vamsi Apfel.   Hayden Pedro was located at [facility] Radiation Oncology Department.  Azeez Dunker and his wife Vaughan Basta were located at home    Carola Rhine, [facility]

## 2020-12-15 ENCOUNTER — Encounter: Payer: Self-pay | Admitting: Internal Medicine

## 2020-12-15 ENCOUNTER — Other Ambulatory Visit: Payer: Self-pay | Admitting: Internal Medicine

## 2020-12-15 ENCOUNTER — Other Ambulatory Visit: Payer: Self-pay

## 2020-12-15 ENCOUNTER — Inpatient Hospital Stay: Payer: 59

## 2020-12-15 ENCOUNTER — Inpatient Hospital Stay (HOSPITAL_BASED_OUTPATIENT_CLINIC_OR_DEPARTMENT_OTHER): Payer: 59 | Admitting: Internal Medicine

## 2020-12-15 VITALS — BP 131/85 | HR 95 | Temp 98.3°F | Resp 18 | Ht 68.0 in | Wt 150.7 lb

## 2020-12-15 DIAGNOSIS — C3431 Malignant neoplasm of lower lobe, right bronchus or lung: Secondary | ICD-10-CM | POA: Diagnosis not present

## 2020-12-15 DIAGNOSIS — C3491 Malignant neoplasm of unspecified part of right bronchus or lung: Secondary | ICD-10-CM | POA: Diagnosis not present

## 2020-12-15 DIAGNOSIS — C7931 Secondary malignant neoplasm of brain: Secondary | ICD-10-CM

## 2020-12-15 DIAGNOSIS — Z5112 Encounter for antineoplastic immunotherapy: Secondary | ICD-10-CM

## 2020-12-15 DIAGNOSIS — Z95828 Presence of other vascular implants and grafts: Secondary | ICD-10-CM

## 2020-12-15 LAB — CBC WITH DIFFERENTIAL (CANCER CENTER ONLY)
Abs Immature Granulocytes: 0.05 10*3/uL (ref 0.00–0.07)
Basophils Absolute: 0.1 10*3/uL (ref 0.0–0.1)
Basophils Relative: 1 %
Eosinophils Absolute: 0.1 10*3/uL (ref 0.0–0.5)
Eosinophils Relative: 2 %
HCT: 39.9 % (ref 39.0–52.0)
Hemoglobin: 13 g/dL (ref 13.0–17.0)
Immature Granulocytes: 1 %
Lymphocytes Relative: 21 %
Lymphs Abs: 1.7 10*3/uL (ref 0.7–4.0)
MCH: 31.6 pg (ref 26.0–34.0)
MCHC: 32.6 g/dL (ref 30.0–36.0)
MCV: 97.1 fL (ref 80.0–100.0)
Monocytes Absolute: 0.7 10*3/uL (ref 0.1–1.0)
Monocytes Relative: 9 %
Neutro Abs: 5.3 10*3/uL (ref 1.7–7.7)
Neutrophils Relative %: 66 %
Platelet Count: 278 10*3/uL (ref 150–400)
RBC: 4.11 MIL/uL — ABNORMAL LOW (ref 4.22–5.81)
RDW: 16.2 % — ABNORMAL HIGH (ref 11.5–15.5)
WBC Count: 8 10*3/uL (ref 4.0–10.5)
nRBC: 0 % (ref 0.0–0.2)

## 2020-12-15 LAB — CMP (CANCER CENTER ONLY)
ALT: 18 U/L (ref 0–44)
AST: 22 U/L (ref 15–41)
Albumin: 3.3 g/dL — ABNORMAL LOW (ref 3.5–5.0)
Alkaline Phosphatase: 71 U/L (ref 38–126)
Anion gap: 8 (ref 5–15)
BUN: 4 mg/dL — ABNORMAL LOW (ref 6–20)
CO2: 25 mmol/L (ref 22–32)
Calcium: 9 mg/dL (ref 8.9–10.3)
Chloride: 104 mmol/L (ref 98–111)
Creatinine: 0.92 mg/dL (ref 0.61–1.24)
GFR, Estimated: >60 mL/min (ref >60)
Glucose, Bld: 91 mg/dL (ref 70–99)
Potassium: 4.1 mmol/L (ref 3.5–5.1)
Sodium: 137 mmol/L (ref 135–145)
Total Bilirubin: 0.5 mg/dL (ref 0.3–1.2)
Total Protein: 6.3 g/dL — ABNORMAL LOW (ref 6.5–8.1)

## 2020-12-15 LAB — TSH: TSH: 40.68 u[IU]/mL — ABNORMAL HIGH (ref 0.320–4.118)

## 2020-12-15 MED ORDER — HEPARIN SOD (PORK) LOCK FLUSH 100 UNIT/ML IV SOLN
500.0000 [IU] | Freq: Once | INTRAVENOUS | Status: AC | PRN
  Administered 2020-12-15: 500 [IU]

## 2020-12-15 MED ORDER — ALTEPLASE 2 MG IJ SOLR: 2.0000 mg | Freq: Once | INTRAMUSCULAR | Status: AC | PRN

## 2020-12-15 MED ORDER — ALTEPLASE 2 MG IJ SOLR
INTRAMUSCULAR | Status: AC
  Administered 2020-12-15: 2 mg
  Filled 2020-12-15: qty 2

## 2020-12-15 MED ORDER — SODIUM CHLORIDE 0.9 % IV SOLN
200.0000 mg | Freq: Once | INTRAVENOUS | Status: AC
  Administered 2020-12-15: 200 mg via INTRAVENOUS
  Filled 2020-12-15: qty 8

## 2020-12-15 MED ORDER — LEVOTHYROXINE SODIUM 125 MCG PO TABS: 125.0000 ug | ORAL_TABLET | Freq: Every day | ORAL | 1 refills | Status: DC

## 2020-12-15 MED ORDER — SODIUM CHLORIDE 0.9 % IV SOLN: Freq: Once | INTRAVENOUS | Status: AC

## 2020-12-15 MED ORDER — SODIUM CHLORIDE 0.9% FLUSH
10.0000 mL | Freq: Once | INTRAVENOUS | Status: AC
  Administered 2020-12-15: 10 mL

## 2020-12-15 MED ORDER — SODIUM CHLORIDE 0.9% FLUSH
10.0000 mL | INTRAVENOUS | Status: DC | PRN
  Administered 2020-12-15: 10 mL

## 2020-12-15 NOTE — Patient Instructions (Signed)
Joseph Atkins ONCOLOGY  Discharge Instructions: Thank you for choosing [facility] to provide your oncology and hematology care.   If you have a lab appointment with the [facility], please go directly to the [facility] and check in at the registration area.   Wear comfortable clothing and clothing appropriate for easy access to any Portacath or PICC line.   We strive to give you quality time with your provider. You may need to reschedule your appointment if you arrive late (15 or more minutes).  Arriving late affects you and other patients whose appointments are after yours.  Also, if you miss three or more appointments without notifying the office, you may be dismissed from the clinic at the provider's discretion.      For prescription refill requests, have your pharmacy contact our office and allow 72 hours for refills to be completed.    Today you received the following chemotherapy and/or immunotherapy agents Joseph Atkins      To help prevent nausea and vomiting after your treatment, we encourage you to take your nausea medication as directed.  BELOW ARE SYMPTOMS THAT SHOULD BE REPORTED IMMEDIATELY: *FEVER GREATER THAN 100.4 F (38 C) OR HIGHER *CHILLS OR SWEATING *NAUSEA AND VOMITING THAT IS NOT CONTROLLED WITH YOUR NAUSEA MEDICATION *UNUSUAL SHORTNESS OF BREATH *UNUSUAL BRUISING OR BLEEDING *URINARY PROBLEMS (pain or burning when urinating, or frequent urination) *BOWEL PROBLEMS (unusual diarrhea, constipation, pain near the anus) TENDERNESS IN MOUTH AND THROAT WITH OR WITHOUT PRESENCE OF ULCERS (sore throat, sores in mouth, or a toothache) UNUSUAL RASH, SWELLING OR PAIN  UNUSUAL VAGINAL DISCHARGE OR ITCHING   Items with * indicate a potential emergency and should be followed up as soon as possible or go to the Emergency Department if any problems should occur.  Please show the CHEMOTHERAPY ALERT CARD or IMMUNOTHERAPY ALERT CARD at check-in to  the Emergency Department and triage nurse.  Should you have questions after your visit or need to cancel or reschedule your appointment, please contact [facility]  Dept: [phone]  and follow the prompts.  Office hours are 8:00 a.m. to 4:30 p.m. Monday - Friday. Please note that voicemails left after 4:00 p.m. may not be returned until the following business day.  We are closed weekends and major holidays. You have access to a nurse at all times for urgent questions. Please call the main number to the clinic Dept: [phone] and follow the prompts.   For any non-urgent questions, you may also contact your provider using MyChart. We now offer e-Visits for anyone 60 and older to request care online for non-urgent symptoms. For details visit [email or web address].   Also download the MyChart app! Go to the app store, search "MyChart", open the app, select [facility], and log in with your MyChart username and password.  Due to Covid, a mask is required upon entering the hospital/clinic. If you do not have a mask, one will be given to you upon arrival. For doctor visits, patients may have 1 support person aged 59 or older with them. For treatment visits, patients cannot have anyone with them due to current Covid guidelines and our immunocompromised population.

## 2020-12-15 NOTE — Progress Notes (Signed)
[place] [phone]) [phone]   [phone]) [phone]  OFFICE PROGRESS NOTE  [name], NP [address] [name] [address] [place] [address]  DIAGNOSIS: Stage IV (T3, N0, M1C) non-small cell lung cancer, adenocarcinoma.  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 He was diagnosed in July 2021.   Molecular Biomarkers:  MSI-High DETECTED Pembrolizumab Atezolizumab, Avelumab, Cemiplimab, Dostarlimab, Durvalumab, Ipilimumab, Nivolumab   STK11Splice Site SNV 0.0% Everolimus, Temsirolimus Yes   KRASG12D 1.7% Binimetinib Yes   [id number] 0.4%   Niraparib, Olaparib, Rucaparib, Talazoparib, Tazemetostat Yes   PRIOR THERAPY:  1) SRS to the solitary brain metastasis under the care of Dr. Lisbeth Renshaw. Last treatment 11/14/19. 2) Weekly concurrent chemoradiation with carboplatin for an AUC of 2, paclitaxel 45 mg/m2.  First dose expected on 11/25/2019. Status post 7 cycles, last dose was giving 01/06/2020 with partial response.    CURRENT THERAPY:  1)  Immunotherapy with Keytruda 200 mg IV every 3 weeks.  First dose February 10, 2020 for a patient with MSI high.  Status post 13 cycles. 2) Avastin 15 mg/KG every 3 weeks.  First dose today for the vasogenic edema of the brain.S/P 7 cycles.  INTERVAL HISTORY: Joseph Atkins 60 y.o. male returns to the clinic today for follow-up visit accompanied by his wife.  The patient is feeling fine today with no concerning complaints.  He has been tolerating his treatment with single agent Keytruda fairly well.  He denied having any current chest pain, shortness of breath, cough or hemoptysis.  He denied having any fever or chills.  He has no nausea, vomiting, diarrhea or constipation.  He has no headache or visual changes.  He had repeat CT scan of the chest, abdomen pelvis as well as MRI of the brain performed recently and he is here for  evaluation and discussion of his scan results and treatment options.  MEDICAL HISTORY: Past Medical History:  Diagnosis Date   Cancer ([place])    lung cancer   Diverticulosis    GERD (gastroesophageal reflux disease)    Hx of small bowel obstruction    Hyperlipidemia    Hypertension    Hypothyroidism    IBS (irritable bowel syndrome)    Substance abuse ([place])    Alcoholic, Drug addition   Thyroid disease     ALLERGIES:  is allergic to penicillins.  MEDICATIONS:  Current Outpatient Medications  Medication Sig Dispense Refill   albuterol (PROVENTIL HFA;VENTOLIN HFA) 108 (90 Base) MCG/ACT inhaler Inhale 2 puffs into the lungs every 6 (six) hours as needed for wheezing or shortness of breath. 1 Inhaler 0   ANORO ELLIPTA 62.5-25 MCG/INH AEPB 1 puff daily.     cyclobenzaprine (FLEXERIL) 10 MG tablet Take 10 mg by mouth 2 (two) times daily as needed for muscle spasms. (Patient not taking: Reported on 11/24/2020)     docusate sodium (COLACE) 100 MG capsule Take 100 mg by mouth daily.     DULoxetine (CYMBALTA) 20 MG capsule Take 1 capsule (20 mg total) by mouth 2 (two) times daily. 60 capsule 5   fluticasone furoate-vilanterol (BREO ELLIPTA) 100-25 MCG/INH AEPB Inhale 1 puff into the lungs daily. 30 each 3   levothyroxine (SYNTHROID) 100 MCG tablet TAKE 1 TABLET BY MOUTH EVERY DAY 90 tablet 1   lidocaine-prilocaine (EMLA) cream Apply 1 application topically as needed. 30 g 0   omeprazole (PRILOSEC) 40 MG capsule  Take 1 capsule (40 mg total) by mouth daily. 90 capsule 4   ondansetron (ZOFRAN) 4 MG tablet Take 1 tablet (4 mg total) by mouth every 8 (eight) hours as needed. (Patient not taking: No sig reported) 40 tablet 2   oxyCODONE-acetaminophen (PERCOCET/ROXICET) 5-325 MG tablet Take 1 tablet by mouth every 4 (four) hours as needed for severe pain. (Patient not taking: No sig reported) 30 tablet 0   polyethylene glycol powder (MIRALAX) powder Take 17 g by mouth daily. 255 g 11    prochlorperazine (COMPAZINE) 10 MG tablet Take 1 tablet (10 mg total) by mouth every 6 (six) hours as needed. (Patient not taking: No sig reported) 30 tablet 2   TRULANCE 3 MG TABS Take 1 tablet by mouth daily.     zolpidem (AMBIEN CR) 12.5 MG CR tablet Take 1 tablet (12.5 mg total) by mouth at bedtime as needed for sleep. (Patient not taking: No sig reported) 30 tablet 5   No current facility-administered medications for this visit.    SURGICAL HISTORY:  Past Surgical History:  Procedure Laterality Date   arm surgery Right    BRONCHIAL BRUSHINGS  10/24/2019   Procedure: BRONCHIAL BRUSHINGS;  Surgeon: Collene Gobble, MD;  Location: [facility] ENDOSCOPY;  Service: Cardiopulmonary;;  right lower lobe    BRONCHIAL BRUSHINGS  11/05/2019   Procedure: BRONCHIAL BRUSHINGS;  Surgeon: Collene Gobble, MD;  Location: [facility] ENDOSCOPY;  Service: Pulmonary;;   BRONCHIAL NEEDLE ASPIRATION BIOPSY  10/24/2019   Procedure: BRONCHIAL NEEDLE ASPIRATION BIOPSIES;  Surgeon: Collene Gobble, MD;  Location: [place];  Service: Cardiopulmonary;;   BRONCHIAL NEEDLE ASPIRATION BIOPSY  11/05/2019   Procedure: BRONCHIAL NEEDLE ASPIRATION BIOPSIES;  Surgeon: Collene Gobble, MD;  Location: [facility] ENDOSCOPY;  Service: Pulmonary;;   ENDOBRONCHIAL ULTRASOUND N/A 10/24/2019   Procedure: ENDOBRONCHIAL ULTRASOUND;  Surgeon: Collene Gobble, MD;  Location: [place];  Service: Cardiopulmonary;  Laterality: N/A;   FINGER SURGERY Right    Middle   IR IMAGING GUIDED PORT INSERTION  11/19/2019   VIDEO BRONCHOSCOPY N/A 10/24/2019   Procedure: VIDEO BRONCHOSCOPY WITHOUT FLUORO;  Surgeon: Collene Gobble, MD;  Location: [place];  Service: Cardiopulmonary;  Laterality: N/A;   VIDEO BRONCHOSCOPY WITH ENDOBRONCHIAL NAVIGATION N/A 11/05/2019   Procedure: VIDEO BRONCHOSCOPY WITH ENDOBRONCHIAL NAVIGATION;  Surgeon: Collene Gobble, MD;  Location: [place] ENDOSCOPY;  Service: Pulmonary;  Laterality: N/A;    REVIEW OF SYSTEMS:  Constitutional: positive  for fatigue Eyes: negative Ears, nose, mouth, throat, and face: negative Respiratory: negative Cardiovascular: negative Gastrointestinal: negative Genitourinary:negative Integument/breast: negative Hematologic/lymphatic: negative Musculoskeletal:negative Neurological: negative Behavioral/Psych: negative Endocrine: negative Allergic/Immunologic: negative   PHYSICAL EXAMINATION: General appearance: alert, cooperative, fatigued, and no distress Head: Normocephalic, without obvious abnormality, atraumatic Neck: no adenopathy, no JVD, supple, symmetrical, trachea midline, and thyroid not enlarged, symmetric, no tenderness/mass/nodules Lymph nodes: Cervical, supraclavicular, and axillary nodes normal. Resp: clear to auscultation bilaterally Back: symmetric, no curvature. ROM normal. No CVA tenderness. Cardio: regular rate and rhythm, S1, S2 normal, no murmur, click, rub or gallop GI: soft, non-tender; bowel sounds normal; no masses,  no organomegaly Extremities: extremities normal, atraumatic, no cyanosis or edema Neurologic: Alert and oriented X 3, normal strength and tone. Normal symmetric reflexes. Normal coordination and gait  ECOG PERFORMANCE STATUS: 1 - Symptomatic but completely ambulatory  Blood pressure 131/85, pulse 95, temperature 98.3 F (36.8 C), temperature source Tympanic, resp. rate 18, height _0  (1.727 m), weight 150 lb 11.2 oz (68.4 kg), SpO2 99 %.  LABORATORY  DATA: Lab Results  Component Value Date   WBC 8.0 12/15/2020   HGB 13.0 12/15/2020   HCT 39.9 12/15/2020   MCV 97.1 12/15/2020   PLT 278 12/15/2020      Chemistry      Component Value Date/Time   NA 140 11/24/2020 1028   NA 141 03/08/2017 1707   K 3.7 11/24/2020 1028   CL 107 11/24/2020 1028   CO2 26 11/24/2020 1028   BUN <4 (L) 11/24/2020 1028   BUN 12 03/08/2017 1707   CREATININE 0.82 11/24/2020 1028      Component Value Date/Time   CALCIUM 8.6 (L) 11/24/2020 1028   ALKPHOS 69 11/24/2020  1028   AST 25 11/24/2020 1028   ALT 20 11/24/2020 1028   BILITOT 0.4 11/24/2020 1028       RADIOGRAPHIC STUDIES: CT Chest W Contrast  Result Date: 12/11/2020 CLINICAL DATA:  Non-small cell lung cancer staging in a 60 year old male, ongoing immunotherapy with prior radiation and chemotherapy EXAM: CT CHEST, ABDOMEN, AND PELVIS WITH CONTRAST TECHNIQUE: Multidetector CT imaging of the chest, abdomen and pelvis was performed following the standard protocol during bolus administration of intravenous contrast. CONTRAST:  65m OMNIPAQUE IOHEXOL 350 MG/ML SOLN COMPARISON:  September 25, 2020 FINDINGS: CT CHEST FINDINGS Cardiovascular: RIGHT-sided Port-A-Cath terminates at the caval to atrial junction. Aortic caliber is normal with scattered atherosclerotic changes. Normal heart size with small pericardial effusion similar to slightly increased compared to the prior exam. Central pulmonary vasculature is unremarkable on venous phase assessment. Mediastinum/Nodes: No axillary, thoracic inlet, mediastinal or hilar lymphadenopathy. Lungs/Pleura: Pulmonary emphysema. Masslike area in the RIGHT lower lobe contiguous with RIGHT hilar structures (image 110/4) 5.1 x 3.6 cm previously 5.9 x 4.5 cm. Small RIGHT-sided pleural effusion slightly increased. No new or suspicious pulmonary nodules elsewhere in the chest. Mild bronchial wall thickening. Musculoskeletal: See below for full musculoskeletal details. No acute bone finding or destructive bone process. CT ABDOMEN PELVIS FINDINGS Hepatobiliary: Central low-density hepatic lesion in the lateral segment of the LEFT hepatic lobe. Stable since at least October 18, 2019. Background hepatic steatosis. No new hepatic lesions. The portal vein is patent. Hepatic veins are patent. Pancreas: Normal, without mass, inflammation or ductal dilatation. Spleen: Normal. Adrenals/Urinary Tract: Adrenal glands are normal. Symmetric renal enhancement. No hydronephrosis. No perivesical stranding.  Urinary bladder under distended. Stomach/Bowel: Mild distension of small bowel with ingested contrast passing distally without signs of discrete transition. Stool like material in the lumen. Moderate stool seen throughout the colon. No signs of perienteric stranding. Stomach without acute process. Vascular/Lymphatic: Calcified and noncalcified atheromatous plaque of the abdominal aorta. Chronic appearing occlusion unchanged of LEFT common iliac artery. There is no gastrohepatic or hepatoduodenal ligament lymphadenopathy. No retroperitoneal or mesenteric lymphadenopathy. No pelvic sidewall lymphadenopathy. Reproductive: Unremarkable Other: No free air.  No ascites. Musculoskeletal: No acute bone finding. No destructive bone process. IMPRESSION: 1. Masslike area in the RIGHT lower lobe contiguous with RIGHT hilar structures is slightly diminished in size, associated with surrounding post treatment changes. No new or progressive findings. 2. Slight increase in small pericardial and RIGHT pleural effusion. 3. 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 4. Mild to moderate dilation of small bowel without discrete transition and without signs of adjacent inflammation. This could be related to delayed enteric transit and or developing ileus. Correlate with any abdominal symptoms. 5. Emphysema and aortic atherosclerosis. Electronically Signed   By: GZetta BillsM.D.   On: 12/11/2020 17:45   MR Brain W Wo  Contrast  Result Date: 12/13/2020 CLINICAL DATA:  Follow-up SRS to the brain EXAM: MRI HEAD WITHOUT AND WITH CONTRAST TECHNIQUE: Multiplanar, multiecho pulse sequences of the brain and surrounding structures were obtained without and with intravenous contrast. CONTRAST:  46m MULTIHANCE GADOBENATE DIMEGLUMINE 529 MG/ML IV SOLN COMPARISON:  09/10/2020 FINDINGS: BRAIN New Lesions: None. Larger lesions: None. Stable or Smaller lesions: 1. 28 mm lesion in the left cerebellum with T1 shortening primarily from  mineralization based on precontrast imaging. 2. Punctate left parasagittal occipital nodule on the 11: 66 3. Left frontal parietal cortex subcentimeter nodule on 11:136 4. Additional previously identified metastases are no longer detectable. Other Brain findings: Ovoid focus of subcentimeter restricted diffusion in the right pons without enhancement. This will be reported to clinical team at Brain tumor Conference tomorrow. No acute hemorrhage, hydrocephalus, or collection. Vascular: Preserved flow voids and vascular enhancements. Small developmental venous anomalies in the superior right frontal lobe and left upper cerebellum. Skull and upper cervical spine: No evidence of marrow lesion. Sinuses/Orbits: No acute finding. Retention cyst in the right maxillary sinus. IMPRESSION: 1. No new or progressive disease. 2. Incidental acute or subacute infarct in the right pons. Electronically Signed   By: JMonte FantasiaM.D.   On: 12/13/2020 08:42   CT Abdomen Pelvis W Contrast  Result Date: 12/11/2020 CLINICAL DATA:  Non-small cell lung cancer staging in a 60year old male, ongoing immunotherapy with prior radiation and chemotherapy EXAM: CT CHEST, ABDOMEN, AND PELVIS WITH CONTRAST TECHNIQUE: Multidetector CT imaging of the chest, abdomen and pelvis was performed following the standard protocol during bolus administration of intravenous contrast. CONTRAST:  824mOMNIPAQUE IOHEXOL 350 MG/ML SOLN COMPARISON:  September 25, 2020 FINDINGS: CT CHEST FINDINGS Cardiovascular: RIGHT-sided Port-A-Cath terminates at the caval to atrial junction. Aortic caliber is normal with scattered atherosclerotic changes. Normal heart size with small pericardial effusion similar to slightly increased compared to the prior exam. Central pulmonary vasculature is unremarkable on venous phase assessment. Mediastinum/Nodes: No axillary, thoracic inlet, mediastinal or hilar lymphadenopathy. Lungs/Pleura: Pulmonary emphysema. Masslike area in the RIGHT  lower lobe contiguous with RIGHT hilar structures (image 110/4) 5.1 x 3.6 cm previously 5.9 x 4.5 cm. Small RIGHT-sided pleural effusion slightly increased. No new or suspicious pulmonary nodules elsewhere in the chest. Mild bronchial wall thickening. Musculoskeletal: See below for full musculoskeletal details. No acute bone finding or destructive bone process. CT ABDOMEN PELVIS FINDINGS Hepatobiliary: Central low-density hepatic lesion in the lateral segment of the LEFT hepatic lobe. Stable since at least October 18, 2019. Background hepatic steatosis. No new hepatic lesions. The portal vein is patent. Hepatic veins are patent. Pancreas: Normal, without mass, inflammation or ductal dilatation. Spleen: Normal. Adrenals/Urinary Tract: Adrenal glands are normal. Symmetric renal enhancement. No hydronephrosis. No perivesical stranding. Urinary bladder under distended. Stomach/Bowel: Mild distension of small bowel with ingested contrast passing distally without signs of discrete transition. Stool like material in the lumen. Moderate stool seen throughout the colon. No signs of perienteric stranding. Stomach without acute process. Vascular/Lymphatic: Calcified and noncalcified atheromatous plaque of the abdominal aorta. Chronic appearing occlusion unchanged of LEFT common iliac artery. There is no gastrohepatic or hepatoduodenal ligament lymphadenopathy. No retroperitoneal or mesenteric lymphadenopathy. No pelvic sidewall lymphadenopathy. Reproductive: Unremarkable Other: No free air.  No ascites. Musculoskeletal: No acute bone finding. No destructive bone process. IMPRESSION: 1. Masslike area in the RIGHT lower lobe contiguous with RIGHT hilar structures is slightly diminished in size, associated with surrounding post treatment changes. No new or progressive findings. 2. Slight increase  in small pericardial and RIGHT pleural effusion. 3. 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 4. Mild to moderate dilation  of small bowel without discrete transition and without signs of adjacent inflammation. This could be related to delayed enteric transit and or developing ileus. Correlate with any abdominal symptoms. 5. Emphysema and aortic atherosclerosis. Electronically Signed   By: Zetta Bills M.D.   On: 12/11/2020 17:45     ASSESSMENT AND PLAN: This is a very pleasant 60 years old white male with a stage IV (t3, N0, M1c) non-small cell lung cancer, adenocarcinoma with MSI high presented with right lower lobe/infrahilar mass in addition to solitary brain metastasis in the left cerebellum diagnosed in July 2021. He is status post SRS to the solitary brain metastasis. The patient completed a course of concurrent chemoradiation with weekly carboplatin and paclitaxel.  He tolerated the treatment well except for fatigue and mild odynophagia. The patient has MSI high and I recommended for him treatment with immunotherapy with single agent Keytruda 200 mg IV every 3 weeks for a total of 2 years unless the patient has unacceptable toxicity or disease progression. He is status post 13 cycles of treatment with Keytruda.  He also received 7 cycles of Avastin for the vasogenic edema in the brain.   Avastin will be on hold for now unless needed in the future. The patient continues to tolerate this treatment well with no concerning adverse effects. He had repeat CT scan of the chest, abdomen and pelvis as well as MRI of the brain performed recently.  I personally and independently reviewed the scans and discussed the results with the patient and his wife today. Has a scan showed no concerning findings for disease progression. I recommended for him to continue his current treatment with [facility] and he will proceed with cycle #14 today. For the metastatic brain lesions, he is followed by Dr. Mickeal Skinner and Dr. Lisbeth Renshaw.   The patient will come back for follow-up visit in 3 weeks for evaluation before the next cycle of his  treatment. He was advised to call immediately if he has any other concerning symptoms in the interval. The patient voices understanding of current disease status and treatment options and is in agreement with the current care plan.  All questions were answered. The patient knows to call the clinic with any problems, questions or concerns. We can certainly see the patient much sooner if necessary.  Disclaimer: This note was dictated with voice recognition software. Similar sounding words can inadvertently be transcribed and may not be corrected upon review.

## 2020-12-16 ENCOUNTER — Other Ambulatory Visit: Payer: Self-pay | Admitting: Radiation Therapy

## 2020-12-16 ENCOUNTER — Telehealth: Payer: Self-pay | Admitting: Medical Oncology

## 2020-12-16 DIAGNOSIS — C7949 Secondary malignant neoplasm of other parts of nervous system: Secondary | ICD-10-CM

## 2020-12-16 DIAGNOSIS — C7931 Secondary malignant neoplasm of brain: Secondary | ICD-10-CM

## 2020-12-16 NOTE — Telephone Encounter (Signed)
Pt notified of synthroid dose change .

## 2021-01-05 ENCOUNTER — Inpatient Hospital Stay: Payer: 59

## 2021-01-05 ENCOUNTER — Other Ambulatory Visit: Payer: Self-pay | Admitting: Radiation Therapy

## 2021-01-05 ENCOUNTER — Inpatient Hospital Stay: Payer: 59 | Attending: Internal Medicine | Admitting: Internal Medicine

## 2021-01-05 ENCOUNTER — Other Ambulatory Visit: Payer: Self-pay

## 2021-01-05 ENCOUNTER — Encounter: Payer: Self-pay | Admitting: *Deleted

## 2021-01-05 VITALS — BP 145/84 | HR 99 | Temp 96.9°F | Resp 20 | Ht 68.0 in | Wt 151.9 lb

## 2021-01-05 DIAGNOSIS — C7949 Secondary malignant neoplasm of other parts of nervous system: Secondary | ICD-10-CM

## 2021-01-05 DIAGNOSIS — C3431 Malignant neoplasm of lower lobe, right bronchus or lung: Secondary | ICD-10-CM | POA: Diagnosis not present

## 2021-01-05 DIAGNOSIS — J439 Emphysema, unspecified: Secondary | ICD-10-CM | POA: Insufficient documentation

## 2021-01-05 DIAGNOSIS — C7931 Secondary malignant neoplasm of brain: Secondary | ICD-10-CM

## 2021-01-05 DIAGNOSIS — C3491 Malignant neoplasm of unspecified part of right bronchus or lung: Secondary | ICD-10-CM | POA: Diagnosis not present

## 2021-01-05 DIAGNOSIS — E039 Hypothyroidism, unspecified: Secondary | ICD-10-CM | POA: Diagnosis not present

## 2021-01-05 DIAGNOSIS — I10 Essential (primary) hypertension: Secondary | ICD-10-CM | POA: Diagnosis not present

## 2021-01-05 DIAGNOSIS — K589 Irritable bowel syndrome without diarrhea: Secondary | ICD-10-CM | POA: Insufficient documentation

## 2021-01-05 DIAGNOSIS — I313 Pericardial effusion (noninflammatory): Secondary | ICD-10-CM | POA: Diagnosis not present

## 2021-01-05 DIAGNOSIS — L299 Pruritus, unspecified: Secondary | ICD-10-CM | POA: Insufficient documentation

## 2021-01-05 DIAGNOSIS — K219 Gastro-esophageal reflux disease without esophagitis: Secondary | ICD-10-CM | POA: Insufficient documentation

## 2021-01-05 DIAGNOSIS — F1010 Alcohol abuse, uncomplicated: Secondary | ICD-10-CM | POA: Diagnosis not present

## 2021-01-05 DIAGNOSIS — Z5112 Encounter for antineoplastic immunotherapy: Secondary | ICD-10-CM | POA: Insufficient documentation

## 2021-01-05 DIAGNOSIS — E785 Hyperlipidemia, unspecified: Secondary | ICD-10-CM | POA: Insufficient documentation

## 2021-01-05 DIAGNOSIS — F1011 Alcohol abuse, in remission: Secondary | ICD-10-CM | POA: Insufficient documentation

## 2021-01-05 DIAGNOSIS — Z79899 Other long term (current) drug therapy: Secondary | ICD-10-CM | POA: Diagnosis not present

## 2021-01-05 DIAGNOSIS — Z5111 Encounter for antineoplastic chemotherapy: Secondary | ICD-10-CM

## 2021-01-05 DIAGNOSIS — Z95828 Presence of other vascular implants and grafts: Secondary | ICD-10-CM

## 2021-01-05 LAB — CBC WITH DIFFERENTIAL (CANCER CENTER ONLY)
Abs Immature Granulocytes: 0.04 10*3/uL (ref 0.00–0.07)
Basophils Absolute: 0.1 10*3/uL (ref 0.0–0.1)
Basophils Relative: 1 %
Eosinophils Absolute: 0.1 10*3/uL (ref 0.0–0.5)
Eosinophils Relative: 1 %
HCT: 37.1 % — ABNORMAL LOW (ref 39.0–52.0)
Hemoglobin: 12.1 g/dL — ABNORMAL LOW (ref 13.0–17.0)
Immature Granulocytes: 0 %
Lymphocytes Relative: 16 %
Lymphs Abs: 1.6 10*3/uL (ref 0.7–4.0)
MCH: 31.5 pg (ref 26.0–34.0)
MCHC: 32.6 g/dL (ref 30.0–36.0)
MCV: 96.6 fL (ref 80.0–100.0)
Monocytes Absolute: 1 10*3/uL (ref 0.1–1.0)
Monocytes Relative: 10 %
Neutro Abs: 7 10*3/uL (ref 1.7–7.7)
Neutrophils Relative %: 72 %
Platelet Count: 269 10*3/uL (ref 150–400)
RBC: 3.84 MIL/uL — ABNORMAL LOW (ref 4.22–5.81)
RDW: 14.5 % (ref 11.5–15.5)
WBC Count: 9.8 10*3/uL (ref 4.0–10.5)
nRBC: 0 % (ref 0.0–0.2)

## 2021-01-05 LAB — CMP (CANCER CENTER ONLY)
ALT: 18 U/L (ref 0–44)
AST: 25 U/L (ref 15–41)
Albumin: 3.6 g/dL (ref 3.5–5.0)
Alkaline Phosphatase: 66 U/L (ref 38–126)
Anion gap: 10 (ref 5–15)
BUN: 7 mg/dL (ref 6–20)
CO2: 24 mmol/L (ref 22–32)
Calcium: 9.1 mg/dL (ref 8.9–10.3)
Chloride: 103 mmol/L (ref 98–111)
Creatinine: 0.83 mg/dL (ref 0.61–1.24)
GFR, Estimated: >60 mL/min (ref >60)
Glucose, Bld: 130 mg/dL — ABNORMAL HIGH (ref 70–99)
Potassium: 3.7 mmol/L (ref 3.5–5.1)
Sodium: 137 mmol/L (ref 135–145)
Total Bilirubin: 0.6 mg/dL (ref 0.3–1.2)
Total Protein: 6.7 g/dL (ref 6.5–8.1)

## 2021-01-05 LAB — TSH: TSH: 11.561 u[IU]/mL — ABNORMAL HIGH (ref 0.350–4.500)

## 2021-01-05 MED ORDER — SODIUM CHLORIDE 0.9% FLUSH
10.0000 mL | Freq: Once | INTRAVENOUS | Status: AC
  Administered 2021-01-05: 10 mL via INTRAVENOUS

## 2021-01-05 MED ORDER — SODIUM CHLORIDE 0.9 % IV SOLN: Freq: Once | INTRAVENOUS | Status: AC

## 2021-01-05 MED ORDER — SODIUM CHLORIDE 0.9% FLUSH
10.0000 mL | INTRAVENOUS | Status: DC | PRN
  Administered 2021-01-05: 10 mL

## 2021-01-05 MED ORDER — SODIUM CHLORIDE 0.9 % IV SOLN
200.0000 mg | Freq: Once | INTRAVENOUS | Status: AC
  Administered 2021-01-05: 200 mg via INTRAVENOUS
  Filled 2021-01-05: qty 8

## 2021-01-05 MED ORDER — HEPARIN SOD (PORK) LOCK FLUSH 100 UNIT/ML IV SOLN
500.0000 [IU] | Freq: Once | INTRAVENOUS | Status: AC | PRN
  Administered 2021-01-05: 500 [IU]

## 2021-01-05 NOTE — Progress Notes (Signed)
Orders placed for port access for December brain MRI at [facility].   Mont Dutton R.T.(R)(T) Radiation Special Procedures Navigator

## 2021-01-05 NOTE — Progress Notes (Signed)
[facility] [phone]   Fax:([phone]  OFFICE PROGRESS NOTE  Joseph Sill, NP [name] [place] [address]  DIAGNOSIS: Stage IV (T3, N0, M1C) non-small cell lung cancer, adenocarcinoma.  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 He was diagnosed in July 2021.   Molecular Biomarkers:  MSI-High DETECTED Pembrolizumab Atezolizumab, Avelumab, Cemiplimab, Dostarlimab, Durvalumab, Ipilimumab, Nivolumab   STK11Splice Site SNV 2.4% Everolimus, Temsirolimus Yes   [id number] 1.7% Binimetinib Yes   [id number] 0.4%   Niraparib, Olaparib, Rucaparib, Talazoparib, Tazemetostat Yes   PRIOR THERAPY:  1) SRS to the solitary brain metastasis under the care of Dr. Lisbeth Renshaw. Last treatment 11/14/19. 2) Weekly concurrent chemoradiation with carboplatin for an AUC of 2, paclitaxel 45 mg/m2.  First dose expected on 11/25/2019. Status post 7 cycles, last dose was giving 01/06/2020 with partial response.    CURRENT THERAPY:  1)  Immunotherapy with Keytruda 200 mg IV every 3 weeks.  First dose February 10, 2020 for a patient with MSI high.  Status post 14 cycles. 2) Avastin 15 mg/KG every 3 weeks.  First dose today for the vasogenic edema of the brain.S/P 7 cycles.  INTERVAL HISTORY: Joseph Atkins 60 y.o. male returns to the clinic today for follow-up visit accompanied by his wife.  The patient is feeling fine today with no concerning complaints.  He quit smoking a week ago.  He denied having any current chest pain, shortness of breath, cough or hemoptysis.  He continues to have mild itching and does not take any medication for the itching except for the lotions.  He denied having any current nausea, vomiting, diarrhea or constipation.  He denied having any headache or visual changes.  He has no weight loss or night sweats.  He is here today for evaluation  before starting cycle #15 of his treatment.  MEDICAL HISTORY: Past Medical History:  Diagnosis Date   Cancer ([place])    lung cancer   Diverticulosis    GERD (gastroesophageal reflux disease)    Hx of small bowel obstruction    Hyperlipidemia    Hypertension    Hypothyroidism    IBS (irritable bowel syndrome)    Substance abuse ([place])    Alcoholic, Drug addition   Thyroid disease     ALLERGIES:  is allergic to penicillins.  MEDICATIONS:  Current Outpatient Medications  Medication Sig Dispense Refill   albuterol (PROVENTIL HFA;VENTOLIN HFA) 108 (90 Base) MCG/ACT inhaler Inhale 2 puffs into the lungs every 6 (six) hours as needed for wheezing or shortness of breath. 1 Inhaler 0   ANORO ELLIPTA 62.5-25 MCG/INH AEPB 1 puff daily.     cyclobenzaprine (FLEXERIL) 10 MG tablet Take 10 mg by mouth 2 (two) times daily as needed for muscle spasms. (Patient not taking: Reported on 11/24/2020)     docusate sodium (COLACE) 100 MG capsule Take 100 mg by mouth daily.     DULoxetine (CYMBALTA) 20 MG capsule Take 1 capsule (20 mg total) by mouth 2 (two) times daily. 60 capsule 5   fluticasone furoate-vilanterol (BREO ELLIPTA) 100-25 MCG/INH AEPB Inhale 1 puff into the lungs daily. 30 each 3   levothyroxine (SYNTHROID) 125 MCG tablet Take 1 tablet (125 mcg total) by mouth daily. 30 tablet 1   lidocaine-prilocaine (EMLA) cream Apply 1 application topically as needed. 30 g 0   omeprazole (PRILOSEC) 40 MG capsule Take  1 capsule (40 mg total) by mouth daily. 90 capsule 4   ondansetron (ZOFRAN) 4 MG tablet Take 1 tablet (4 mg total) by mouth every 8 (eight) hours as needed. (Patient not taking: No sig reported) 40 tablet 2   oxyCODONE-acetaminophen (PERCOCET/ROXICET) 5-325 MG tablet Take 1 tablet by mouth every 4 (four) hours as needed for severe pain. (Patient not taking: No sig reported) 30 tablet 0   polyethylene glycol powder (MIRALAX) powder Take 17 g by mouth daily. 255 g 11   prochlorperazine  (COMPAZINE) 10 MG tablet Take 1 tablet (10 mg total) by mouth every 6 (six) hours as needed. (Patient not taking: No sig reported) 30 tablet 2   TRULANCE 3 MG TABS Take 1 tablet by mouth daily.     zolpidem (AMBIEN CR) 12.5 MG CR tablet Take 1 tablet (12.5 mg total) by mouth at bedtime as needed for sleep. (Patient not taking: No sig reported) 30 tablet 5   No current facility-administered medications for this visit.    SURGICAL HISTORY:  Past Surgical History:  Procedure Laterality Date   arm surgery Right    BRONCHIAL BRUSHINGS  10/24/2019   Procedure: BRONCHIAL BRUSHINGS;  Surgeon: Collene Gobble, MD;  Location: [facility] ENDOSCOPY;  Service: Cardiopulmonary;;  right lower lobe    BRONCHIAL BRUSHINGS  11/05/2019   Procedure: BRONCHIAL BRUSHINGS;  Surgeon: Collene Gobble, MD;  Location: [facility] ENDOSCOPY;  Service: Pulmonary;;   BRONCHIAL NEEDLE ASPIRATION BIOPSY  10/24/2019   Procedure: BRONCHIAL NEEDLE ASPIRATION BIOPSIES;  Surgeon: Collene Gobble, MD;  Location: [place] [facility];  Service: Cardiopulmonary;;   BRONCHIAL NEEDLE ASPIRATION BIOPSY  11/05/2019   Procedure: BRONCHIAL NEEDLE ASPIRATION BIOPSIES;  Surgeon: Collene Gobble, MD;  Location: [facility] ENDOSCOPY;  Service: Pulmonary;;   ENDOBRONCHIAL ULTRASOUND N/A 10/24/2019   Procedure: ENDOBRONCHIAL ULTRASOUND;  Surgeon: Collene Gobble, MD;  Location: [facility];  Service: Cardiopulmonary;  Laterality: N/A;   FINGER SURGERY Right    Middle   IR IMAGING GUIDED PORT INSERTION  11/19/2019   VIDEO BRONCHOSCOPY N/A 10/24/2019   Procedure: VIDEO BRONCHOSCOPY WITHOUT FLUORO;  Surgeon: Collene Gobble, MD;  Location: [place] [facility];  Service: Cardiopulmonary;  Laterality: N/A;   VIDEO BRONCHOSCOPY WITH ENDOBRONCHIAL NAVIGATION N/A 11/05/2019   Procedure: VIDEO BRONCHOSCOPY WITH ENDOBRONCHIAL NAVIGATION;  Surgeon: Collene Gobble, MD;  Location: [facility] ENDOSCOPY;  Service: Pulmonary;  Laterality: N/A;    REVIEW OF SYSTEMS:  A comprehensive review of systems was  negative except for: Integument/breast: positive for pruritus   PHYSICAL EXAMINATION: General appearance: alert, cooperative, fatigued, and no distress Head: Normocephalic, without obvious abnormality, atraumatic Neck: no adenopathy, no JVD, supple, symmetrical, trachea midline, and thyroid not enlarged, symmetric, no tenderness/mass/nodules Lymph nodes: Cervical, supraclavicular, and axillary nodes normal. Resp: clear to auscultation bilaterally Back: symmetric, no curvature. ROM normal. No CVA tenderness. Cardio: regular rate and rhythm, S1, S2 normal, no murmur, click, rub or gallop GI: soft, non-tender; bowel sounds normal; no masses,  no organomegaly Extremities: extremities normal, atraumatic, no cyanosis or edema  ECOG PERFORMANCE STATUS: 1 - Symptomatic but completely ambulatory  Blood pressure (!) 145/84, pulse 99, temperature (!) 96.9 F (36.1 C), temperature source Tympanic, resp. rate 20, height $RemoveBe'5\' 8"'IUTRTYOEw$  (1.727 m), weight 151 lb 14.4 oz (68.9 kg), SpO2 100 %.  LABORATORY DATA: Lab Results  Component Value Date   WBC 8.0 12/15/2020   HGB 13.0 12/15/2020   HCT 39.9 12/15/2020   MCV 97.1 12/15/2020   PLT 278 12/15/2020  Chemistry      Component Value Date/Time   NA 137 12/15/2020 1051   NA 141 03/08/2017 1707   K 4.1 12/15/2020 1051   CL 104 12/15/2020 1051   CO2 25 12/15/2020 1051   BUN 4 (L) 12/15/2020 1051   BUN 12 03/08/2017 1707   CREATININE 0.92 12/15/2020 1051      Component Value Date/Time   CALCIUM 9.0 12/15/2020 1051   ALKPHOS 71 12/15/2020 1051   AST 22 12/15/2020 1051   ALT 18 12/15/2020 1051   BILITOT 0.5 12/15/2020 1051       RADIOGRAPHIC STUDIES: CT Chest W Contrast  Result Date: 12/11/2020 CLINICAL DATA:  Non-small cell lung cancer staging in a 60 year old male, ongoing immunotherapy with prior radiation and chemotherapy EXAM: CT CHEST, ABDOMEN, AND PELVIS WITH CONTRAST TECHNIQUE: Multidetector CT imaging of the chest, abdomen and pelvis  was performed following the standard protocol during bolus administration of intravenous contrast. CONTRAST:  35mL OMNIPAQUE IOHEXOL 350 MG/ML SOLN COMPARISON:  September 25, 2020 FINDINGS: CT CHEST FINDINGS Cardiovascular: RIGHT-sided Port-A-Cath terminates at the caval to atrial junction. Aortic caliber is normal with scattered atherosclerotic changes. Normal heart size with small pericardial effusion similar to slightly increased compared to the prior exam. Central pulmonary vasculature is unremarkable on venous phase assessment. Mediastinum/Nodes: No axillary, thoracic inlet, mediastinal or hilar lymphadenopathy. Lungs/Pleura: Pulmonary emphysema. Masslike area in the RIGHT lower lobe contiguous with RIGHT hilar structures (image 110/4) 5.1 x 3.6 cm previously 5.9 x 4.5 cm. Small RIGHT-sided pleural effusion slightly increased. No new or suspicious pulmonary nodules elsewhere in the chest. Mild bronchial wall thickening. Musculoskeletal: See below for full musculoskeletal details. No acute bone finding or destructive bone process. CT ABDOMEN PELVIS FINDINGS Hepatobiliary: Central low-density hepatic lesion in the lateral segment of the LEFT hepatic lobe. Stable since at least October 18, 2019. Background hepatic steatosis. No new hepatic lesions. The portal vein is patent. Hepatic veins are patent. Pancreas: Normal, without mass, inflammation or ductal dilatation. Spleen: Normal. Adrenals/Urinary Tract: Adrenal glands are normal. Symmetric renal enhancement. No hydronephrosis. No perivesical stranding. Urinary bladder under distended. Stomach/Bowel: Mild distension of small bowel with ingested contrast passing distally without signs of discrete transition. Stool like material in the lumen. Moderate stool seen throughout the colon. No signs of perienteric stranding. Stomach without acute process. Vascular/Lymphatic: Calcified and noncalcified atheromatous plaque of the abdominal aorta. Chronic appearing occlusion  unchanged of LEFT common iliac artery. There is no gastrohepatic or hepatoduodenal ligament lymphadenopathy. No retroperitoneal or mesenteric lymphadenopathy. No pelvic sidewall lymphadenopathy. Reproductive: Unremarkable Other: No free air.  No ascites. Musculoskeletal: No acute bone finding. No destructive bone process. IMPRESSION: 1. Masslike area in the RIGHT lower lobe contiguous with RIGHT hilar structures is slightly diminished in size, associated with surrounding post treatment changes. No new or progressive findings. 2. Slight increase in small pericardial and RIGHT pleural effusion. 3. 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 4. Mild to moderate dilation of small bowel without discrete transition and without signs of adjacent inflammation. This could be related to delayed enteric transit and or developing ileus. Correlate with any abdominal symptoms. 5. Emphysema and aortic atherosclerosis. Electronically Signed   By: Zetta Bills M.D.   On: 12/11/2020 17:45   MR Brain W Wo Contrast  Result Date: 12/13/2020 CLINICAL DATA:  Follow-up SRS to the brain EXAM: MRI HEAD WITHOUT AND WITH CONTRAST TECHNIQUE: Multiplanar, multiecho pulse sequences of the brain and surrounding structures were obtained without and with intravenous contrast.  CONTRAST:  65mL MULTIHANCE GADOBENATE DIMEGLUMINE 529 MG/ML IV SOLN COMPARISON:  09/10/2020 FINDINGS: BRAIN New Lesions: None. Larger lesions: None. Stable or Smaller lesions: 1. 28 mm lesion in the left cerebellum with T1 shortening primarily from mineralization based on precontrast imaging. 2. Punctate left parasagittal occipital nodule on the 11: 66 3. Left frontal parietal cortex subcentimeter nodule on 11:136 4. Additional previously identified metastases are no longer detectable. Other Brain findings: Ovoid focus of subcentimeter restricted diffusion in the right pons without enhancement. This will be reported to clinical team at Brain tumor Conference  tomorrow. No acute hemorrhage, hydrocephalus, or collection. Vascular: Preserved flow voids and vascular enhancements. Small developmental venous anomalies in the superior right frontal lobe and left upper cerebellum. Skull and upper cervical spine: No evidence of marrow lesion. Sinuses/Orbits: No acute finding. Retention cyst in the right maxillary sinus. IMPRESSION: 1. No new or progressive disease. 2. Incidental acute or subacute infarct in the right pons. Electronically Signed   By: Monte Fantasia M.D.   On: 12/13/2020 08:42   CT Abdomen Pelvis W Contrast  Result Date: 12/11/2020 CLINICAL DATA:  Non-small cell lung cancer staging in a 60 year old male, ongoing immunotherapy with prior radiation and chemotherapy EXAM: CT CHEST, ABDOMEN, AND PELVIS WITH CONTRAST TECHNIQUE: Multidetector CT imaging of the chest, abdomen and pelvis was performed following the standard protocol during bolus administration of intravenous contrast. CONTRAST:  22mL OMNIPAQUE IOHEXOL 350 MG/ML SOLN COMPARISON:  September 25, 2020 FINDINGS: CT CHEST FINDINGS Cardiovascular: RIGHT-sided Port-A-Cath terminates at the caval to atrial junction. Aortic caliber is normal with scattered atherosclerotic changes. Normal heart size with small pericardial effusion similar to slightly increased compared to the prior exam. Central pulmonary vasculature is unremarkable on venous phase assessment. Mediastinum/Nodes: No axillary, thoracic inlet, mediastinal or hilar lymphadenopathy. Lungs/Pleura: Pulmonary emphysema. Masslike area in the RIGHT lower lobe contiguous with RIGHT hilar structures (image 110/4) 5.1 x 3.6 cm previously 5.9 x 4.5 cm. Small RIGHT-sided pleural effusion slightly increased. No new or suspicious pulmonary nodules elsewhere in the chest. Mild bronchial wall thickening. Musculoskeletal: See below for full musculoskeletal details. No acute bone finding or destructive bone process. CT ABDOMEN PELVIS FINDINGS Hepatobiliary: Central  low-density hepatic lesion in the lateral segment of the LEFT hepatic lobe. Stable since at least October 18, 2019. Background hepatic steatosis. No new hepatic lesions. The portal vein is patent. Hepatic veins are patent. Pancreas: Normal, without mass, inflammation or ductal dilatation. Spleen: Normal. Adrenals/Urinary Tract: Adrenal glands are normal. Symmetric renal enhancement. No hydronephrosis. No perivesical stranding. Urinary bladder under distended. Stomach/Bowel: Mild distension of small bowel with ingested contrast passing distally without signs of discrete transition. Stool like material in the lumen. Moderate stool seen throughout the colon. No signs of perienteric stranding. Stomach without acute process. Vascular/Lymphatic: Calcified and noncalcified atheromatous plaque of the abdominal aorta. Chronic appearing occlusion unchanged of LEFT common iliac artery. There is no gastrohepatic or hepatoduodenal ligament lymphadenopathy. No retroperitoneal or mesenteric lymphadenopathy. No pelvic sidewall lymphadenopathy. Reproductive: Unremarkable Other: No free air.  No ascites. Musculoskeletal: No acute bone finding. No destructive bone process. IMPRESSION: 1. Masslike area in the RIGHT lower lobe contiguous with RIGHT hilar structures is slightly diminished in size, associated with surrounding post treatment changes. No new or progressive findings. 2. Slight increase in small pericardial and RIGHT pleural effusion. 3. 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 4. Mild to moderate dilation of small bowel without discrete transition and without signs of adjacent inflammation. This could be related  to delayed enteric transit and or developing ileus. Correlate with any abdominal symptoms. 5. Emphysema and aortic atherosclerosis. Electronically Signed   By: Zetta Bills M.D.   On: 12/11/2020 17:45     ASSESSMENT AND PLAN: This is a very pleasant 60 years old white male with a stage IV (t3, N0,  M1c) non-small cell lung cancer, adenocarcinoma with MSI high presented with right lower lobe/infrahilar mass in addition to solitary brain metastasis in the left cerebellum diagnosed in July 2021. He is status post SRS to the solitary brain metastasis. The patient completed a course of concurrent chemoradiation with weekly carboplatin and paclitaxel.  He tolerated the treatment well except for fatigue and mild odynophagia. The patient has MSI high and I recommended for him treatment with immunotherapy with single agent Keytruda 200 mg IV every 3 weeks for a total of 2 years unless the patient has unacceptable toxicity or disease progression. He is status post 14 cycles of treatment with Keytruda.  He also received 7 cycles of Avastin for the vasogenic edema in the brain.   Avastin will be on hold for now unless needed in the future. The patient continues to tolerate his treatment well with no concerning adverse effects except for itching. I recommended for him to proceed with cycle #15 today as planned. For the pruritus, I will try Benadryl at nighttime as needed. I will see him back for follow-up visit in 3 weeks for evaluation before the next cycle of his treatment. The patient was advised to call immediately if he has any concerning symptoms in the interval. The patient voices understanding of current disease status and treatment options and is in agreement with the current care plan.  All questions were answered. The patient knows to call the clinic with any problems, questions or concerns. We can certainly see the patient much sooner if necessary.  Disclaimer: This note was dictated with voice recognition software. Similar sounding words can inadvertently be transcribed and may not be corrected upon review.

## 2021-01-05 NOTE — Progress Notes (Signed)
Spoke to Mr. Polyak and his wife today.  He states he quit smoking.  He congratulated and encouraged him not to start back.  I also encouraged his wife to quit.

## 2021-01-05 NOTE — Patient Instructions (Signed)
[facility] MED[facility]COLOG[facility]  Discharge Instructions: ?Thank you for choosing [facility] to provide your oncology and hematology care.  ? ?If you have a lab appointment with the Cancer Center, please go directly to the Cancer Center and check in at the registration area. ?  ?Wear comfortable clothing and clothing appropriate for easy access to any Portacath or PICC line.  ? ?We strive to give you quality time with your provider. You may need to reschedule your appointment if you arrive late (15 or more minutes).  Arriving late affects you and other patients whose appointments are after yours.  Also, if you miss three or more appointments without notifying the office, you may be dismissed from the clinic at the provider?s discretion.    ?  ?For prescription refill requests, have your pharmacy contact our office and allow 72 hours for refills to be completed.   ? ?Today you received the following chemotherapy and/or immunotherapy agents: Keytruda ?  ?To help prevent nausea and vomiting after your treatment, we encourage you to take your nausea medication as directed. ? ?BELOW ARE SYMPTOMS THAT SHOULD BE REPORTED IMMEDIATELY: ?*FEVER GREATER THAN 100.4 F (38 ?C) OR HIGHER ?*CHILLS OR SWEATING ?*NAUSEA AND VOMITING THAT IS NOT CONTROLLED WITH YOUR NAUSEA MEDICATION ?*UNUSUAL SHORTNESS OF BREATH ?*UNUSUAL BRUISING OR BLEEDING ?*URINARY PROBLEMS (pain or burning when urinating, or frequent urination) ?*BOWEL PROBLEMS (unusual diarrhea, constipation, pain near the anus) ?TENDERNESS IN MOUTH AND THROAT WITH OR WITHOUT PRESENCE OF ULCERS (sore throat, sores in mouth, or a toothache) ?UNUSUAL RASH, SWELLING OR PAIN  ?UNUSUAL VAGINAL DISCHARGE OR ITCHING  ? ?Items with * indicate a potential emergency and should be followed up as soon as possible or go to the Emergency Department if any problems should occur. ? ?Please show the CHEMOTHERAPY ALERT CARD or IMMUNOTHERAPY ALERT CARD at check-in to the  Emergency Department and triage nurse. ? ?Should you have questions after your visit or need to cancel or reschedule your appointment, please contact  [facility]  Dept: [phone]  and follow the prompts.  Office hours are 8:00 a.m. to 4:30 p.m. Monday - Friday. Please note that voicemails left after 4:00 p.m. may not be returned until the following business day.  We are closed weekends and major holidays. You have access to a nurse at all times for urgent questions. Please call the main number to the clinic Dept: [phone] and follow the prompts. ? ? ?For any non-urgent questions, you may also contact your provider using MyChart. We now offer e-Visits for anyone 18 and older to request care online for non-urgent symptoms. For details visit [email or web address]. ?  ?Also download the MyChart app! Go to the app store, search "MyChart", open the app, select , and log in with your MyChart username and password. ? ?Due to Covid, a mask is required upon entering the hospital/clinic. If you do not have a mask, one will be given to you upon arrival. For doctor visits, patients may have 1 support person aged 18 or older with them. For treatment visits, patients cannot have anyone with them due to current Covid guidelines and our immunocompromised population.  ? ?

## 2021-01-07 ENCOUNTER — Telehealth: Payer: Self-pay | Admitting: Internal Medicine

## 2021-01-07 NOTE — Telephone Encounter (Signed)
Scheduled appt per 9/13 los - patient to get an updated schedule next visit.

## 2021-01-26 ENCOUNTER — Encounter: Payer: Self-pay | Admitting: Internal Medicine

## 2021-01-26 ENCOUNTER — Inpatient Hospital Stay: Payer: 59

## 2021-01-26 ENCOUNTER — Other Ambulatory Visit: Payer: Self-pay | Admitting: Internal Medicine

## 2021-01-26 ENCOUNTER — Inpatient Hospital Stay: Payer: 59 | Attending: Internal Medicine | Admitting: Internal Medicine

## 2021-01-26 ENCOUNTER — Other Ambulatory Visit: Payer: Self-pay

## 2021-01-26 VITALS — BP 148/101 | HR 98 | Temp 97.6°F | Resp 19 | Ht 68.0 in | Wt 155.6 lb

## 2021-01-26 VITALS — BP 140/90 | HR 96

## 2021-01-26 DIAGNOSIS — I10 Essential (primary) hypertension: Secondary | ICD-10-CM | POA: Diagnosis not present

## 2021-01-26 DIAGNOSIS — F1010 Alcohol abuse, uncomplicated: Secondary | ICD-10-CM | POA: Insufficient documentation

## 2021-01-26 DIAGNOSIS — Z5112 Encounter for antineoplastic immunotherapy: Secondary | ICD-10-CM | POA: Diagnosis not present

## 2021-01-26 DIAGNOSIS — K219 Gastro-esophageal reflux disease without esophagitis: Secondary | ICD-10-CM | POA: Diagnosis not present

## 2021-01-26 DIAGNOSIS — C3491 Malignant neoplasm of unspecified part of right bronchus or lung: Secondary | ICD-10-CM

## 2021-01-26 DIAGNOSIS — C7931 Secondary malignant neoplasm of brain: Secondary | ICD-10-CM | POA: Diagnosis not present

## 2021-01-26 DIAGNOSIS — R21 Rash and other nonspecific skin eruption: Secondary | ICD-10-CM | POA: Insufficient documentation

## 2021-01-26 DIAGNOSIS — R609 Edema, unspecified: Secondary | ICD-10-CM | POA: Insufficient documentation

## 2021-01-26 DIAGNOSIS — C3431 Malignant neoplasm of lower lobe, right bronchus or lung: Secondary | ICD-10-CM | POA: Insufficient documentation

## 2021-01-26 DIAGNOSIS — Z79899 Other long term (current) drug therapy: Secondary | ICD-10-CM | POA: Diagnosis not present

## 2021-01-26 DIAGNOSIS — E785 Hyperlipidemia, unspecified: Secondary | ICD-10-CM | POA: Diagnosis not present

## 2021-01-26 DIAGNOSIS — E039 Hypothyroidism, unspecified: Secondary | ICD-10-CM | POA: Insufficient documentation

## 2021-01-26 DIAGNOSIS — Z95828 Presence of other vascular implants and grafts: Secondary | ICD-10-CM

## 2021-01-26 LAB — CMP (CANCER CENTER ONLY)
ALT: 20 U/L (ref 0–44)
AST: 23 U/L (ref 15–41)
Albumin: 3.6 g/dL (ref 3.5–5.0)
Alkaline Phosphatase: 66 U/L (ref 38–126)
Anion gap: 9 (ref 5–15)
BUN: 7 mg/dL (ref 6–20)
CO2: 24 mmol/L (ref 22–32)
Calcium: 9.2 mg/dL (ref 8.9–10.3)
Chloride: 104 mmol/L (ref 98–111)
Creatinine: 0.83 mg/dL (ref 0.61–1.24)
GFR, Estimated: >60 mL/min (ref >60)
Glucose, Bld: 143 mg/dL — ABNORMAL HIGH (ref 70–99)
Potassium: 3.6 mmol/L (ref 3.5–5.1)
Sodium: 137 mmol/L (ref 135–145)
Total Bilirubin: 0.6 mg/dL (ref 0.3–1.2)
Total Protein: 6.6 g/dL (ref 6.5–8.1)

## 2021-01-26 LAB — CBC WITH DIFFERENTIAL (CANCER CENTER ONLY)
Abs Immature Granulocytes: 0.04 10*3/uL (ref 0.00–0.07)
Basophils Absolute: 0.1 10*3/uL (ref 0.0–0.1)
Basophils Relative: 1 %
Eosinophils Absolute: 0.1 10*3/uL (ref 0.0–0.5)
Eosinophils Relative: 1 %
HCT: 38.4 % — ABNORMAL LOW (ref 39.0–52.0)
Hemoglobin: 12.5 g/dL — ABNORMAL LOW (ref 13.0–17.0)
Immature Granulocytes: 0 %
Lymphocytes Relative: 13 %
Lymphs Abs: 1.3 10*3/uL (ref 0.7–4.0)
MCH: 31.2 pg (ref 26.0–34.0)
MCHC: 32.6 g/dL (ref 30.0–36.0)
MCV: 95.8 fL (ref 80.0–100.0)
Monocytes Absolute: 1 10*3/uL (ref 0.1–1.0)
Monocytes Relative: 10 %
Neutro Abs: 7.6 10*3/uL (ref 1.7–7.7)
Neutrophils Relative %: 75 %
Platelet Count: 248 10*3/uL (ref 150–400)
RBC: 4.01 MIL/uL — ABNORMAL LOW (ref 4.22–5.81)
RDW: 13.1 % (ref 11.5–15.5)
WBC Count: 10.1 10*3/uL (ref 4.0–10.5)
nRBC: 0 % (ref 0.0–0.2)

## 2021-01-26 LAB — TSH: TSH: 3.49 u[IU]/mL (ref 0.320–4.118)

## 2021-01-26 MED ORDER — SODIUM CHLORIDE 0.9 % IV SOLN: Freq: Once | INTRAVENOUS | Status: AC

## 2021-01-26 MED ORDER — SODIUM CHLORIDE 0.9% FLUSH
10.0000 mL | INTRAVENOUS | Status: DC | PRN
  Administered 2021-01-26: 10 mL

## 2021-01-26 MED ORDER — HEPARIN SOD (PORK) LOCK FLUSH 100 UNIT/ML IV SOLN
500.0000 [IU] | Freq: Once | INTRAVENOUS | Status: AC | PRN
  Administered 2021-01-26: 500 [IU]

## 2021-01-26 MED ORDER — SODIUM CHLORIDE 0.9 % IV SOLN
200.0000 mg | Freq: Once | INTRAVENOUS | Status: AC
  Administered 2021-01-26: 200 mg via INTRAVENOUS
  Filled 2021-01-26: qty 8

## 2021-01-26 MED ORDER — SODIUM CHLORIDE 0.9% FLUSH
10.0000 mL | Freq: Once | INTRAVENOUS | Status: AC
  Administered 2021-01-26: 10 mL

## 2021-01-26 NOTE — Progress Notes (Signed)
[place] [phone]   Fax:(336) [phone]  OFFICE PROGRESS NOTE  Joseph Sill, NP [address] [name] [address] [place] [address]  DIAGNOSIS: Stage IV (T3, N0, M1C) non-small cell lung cancer, adenocarcinoma.  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 He was diagnosed in July 2021.   Molecular Biomarkers:  MSI-High DETECTED Pembrolizumab Atezolizumab, Avelumab, Cemiplimab, Dostarlimab, Durvalumab, Ipilimumab, Nivolumab   STK11Splice Site SNV 3.2% Everolimus, Temsirolimus Yes   [id number] 1.7% Binimetinib Yes   [id number] 0.4%   Niraparib, Olaparib, Rucaparib, Talazoparib, Tazemetostat Yes   PRIOR THERAPY:  1) SRS to the solitary brain metastasis under the care of Dr. Lisbeth Renshaw. Last treatment 11/14/19. 2) Weekly concurrent chemoradiation with carboplatin for an AUC of 2, paclitaxel 45 mg/m2.  First dose expected on 11/25/2019. Status post 7 cycles, last dose was giving 01/06/2020 with partial response.    CURRENT THERAPY:  1)  Immunotherapy with Keytruda 200 mg IV every 3 weeks.  First dose February 10, 2020 for a patient with MSI high.  Status post 15  cycles. 2) Avastin 15 mg/KG every 3 weeks.  First dose today for the vasogenic edema of the brain.S/P 7 cycles.  INTERVAL HISTORY: Joseph Atkins 60 y.o. male returns to the clinic today for follow-up visit.  The patient is feeling fine today with no concerning complaints except for mild rash all over his body as well as itching.  He has been using over-the-counter creams as well as goat milk creams with some mild improvement.  He does not like to take Benadryl because concerned about sleeping.  He denied having any current chest pain, shortness of breath, cough or hemoptysis.  He denied having any fever or chills.  He has no nausea, vomiting, diarrhea or constipation.  He has no headache or visual changes.   He is here today for evaluation before starting cycle #16.  MEDICAL HISTORY: Past Medical History:  Diagnosis Date   Cancer ([place])    lung cancer   Diverticulosis    GERD (gastroesophageal reflux disease)    Hx of small bowel obstruction    Hyperlipidemia    Hypertension    Hypothyroidism    IBS (irritable bowel syndrome)    Substance abuse ([place])    Alcoholic, Drug addition   Thyroid disease     ALLERGIES:  is allergic to penicillins.  MEDICATIONS:  Current Outpatient Medications  Medication Sig Dispense Refill   albuterol (PROVENTIL HFA;VENTOLIN HFA) 108 (90 Base) MCG/ACT inhaler Inhale 2 puffs into the lungs every 6 (six) hours as needed for wheezing or shortness of breath. 1 Inhaler 0   ANORO ELLIPTA 62.5-25 MCG/INH AEPB 1 puff daily.     cyclobenzaprine (FLEXERIL) 10 MG tablet Take 10 mg by mouth 2 (two) times daily as needed for muscle spasms. (Patient not taking: Reported on 11/24/2020)     docusate sodium (COLACE) 100 MG capsule Take 100 mg by mouth daily.     DULoxetine (CYMBALTA) 20 MG capsule Take 1 capsule (20 mg total) by mouth 2 (two) times daily. 60 capsule 5   fluticasone furoate-vilanterol (BREO ELLIPTA) 100-25 MCG/INH AEPB Inhale 1 puff into the lungs daily. 30 each 3   levothyroxine (SYNTHROID) 125 MCG tablet Take 1 tablet (125 mcg total) by mouth daily. 30 tablet 1   lidocaine-prilocaine (EMLA) cream Apply 1 application topically as needed. 30 g 0   omeprazole (PRILOSEC)  40 MG capsule Take 1 capsule (40 mg total) by mouth daily. 90 capsule 4   ondansetron (ZOFRAN) 4 MG tablet Take 1 tablet (4 mg total) by mouth every 8 (eight) hours as needed. (Patient not taking: No sig reported) 40 tablet 2   oxyCODONE-acetaminophen (PERCOCET/ROXICET) 5-325 MG tablet Take 1 tablet by mouth every 4 (four) hours as needed for severe pain. (Patient not taking: No sig reported) 30 tablet 0   polyethylene glycol powder (MIRALAX) powder Take 17 g by mouth daily. 255 g 11    prochlorperazine (COMPAZINE) 10 MG tablet Take 1 tablet (10 mg total) by mouth every 6 (six) hours as needed. (Patient not taking: No sig reported) 30 tablet 2   TRULANCE 3 MG TABS Take 1 tablet by mouth daily.     zolpidem (AMBIEN CR) 12.5 MG CR tablet Take 1 tablet (12.5 mg total) by mouth at bedtime as needed for sleep. (Patient not taking: No sig reported) 30 tablet 5   No current facility-administered medications for this visit.    SURGICAL HISTORY:  Past Surgical History:  Procedure Laterality Date   arm surgery Right    BRONCHIAL BRUSHINGS  10/24/2019   Procedure: BRONCHIAL BRUSHINGS;  Surgeon: Leslye Peer, MD;  Location: [facility] ENDOSCOPY;  Service: Cardiopulmonary;;  right lower lobe    BRONCHIAL BRUSHINGS  11/05/2019   Procedure: BRONCHIAL BRUSHINGS;  Surgeon: Leslye Peer, MD;  Location: [facility] ENDOSCOPY;  Service: Pulmonary;;   BRONCHIAL NEEDLE ASPIRATION BIOPSY  10/24/2019   Procedure: BRONCHIAL NEEDLE ASPIRATION BIOPSIES;  Surgeon: Leslye Peer, MD;  Location: [facility] ENDOSCOPY;  Service: Cardiopulmonary;;   BRONCHIAL NEEDLE ASPIRATION BIOPSY  11/05/2019   Procedure: BRONCHIAL NEEDLE ASPIRATION BIOPSIES;  Surgeon: Leslye Peer, MD;  Location: [facility] ENDOSCOPY;  Service: Pulmonary;;   ENDOBRONCHIAL ULTRASOUND N/A 10/24/2019   Procedure: ENDOBRONCHIAL ULTRASOUND;  Surgeon: Leslye Peer, MD;  Location: [facility] ENDOSCOPY;  Service: Cardiopulmonary;  Laterality: N/A;   FINGER SURGERY Right    Middle   IR IMAGING GUIDED PORT INSERTION  11/19/2019   VIDEO BRONCHOSCOPY N/A 10/24/2019   Procedure: VIDEO BRONCHOSCOPY WITHOUT FLUORO;  Surgeon: Leslye Peer, MD;  Location: [facility] ENDOSCOPY;  Service: Cardiopulmonary;  Laterality: N/A;   VIDEO BRONCHOSCOPY WITH ENDOBRONCHIAL NAVIGATION N/A 11/05/2019   Procedure: VIDEO BRONCHOSCOPY WITH ENDOBRONCHIAL NAVIGATION;  Surgeon: Leslye Peer, MD;  Location: [facility] ENDOSCOPY;  Service: Pulmonary;  Laterality: N/A;    REVIEW OF SYSTEMS:  A comprehensive review of  systems was negative except for: Integument/breast: positive for pruritus and rash   PHYSICAL EXAMINATION: General appearance: alert, cooperative, fatigued, and no distress Head: Normocephalic, without obvious abnormality, atraumatic Neck: no adenopathy, no JVD, supple, symmetrical, trachea midline, and thyroid not enlarged, symmetric, no tenderness/mass/nodules Lymph nodes: Cervical, supraclavicular, and axillary nodes normal. Resp: clear to auscultation bilaterally Back: symmetric, no curvature. ROM normal. No CVA tenderness. Cardio: regular rate and rhythm, S1, S2 normal, no murmur, click, rub or gallop GI: soft, non-tender; bowel sounds normal; no masses,  no organomegaly Extremities: extremities normal, atraumatic, no cyanosis or edema  ECOG PERFORMANCE STATUS: 1 - Symptomatic but completely ambulatory  Blood pressure (!) 148/101, pulse 98, temperature 97.6 F (36.4 C), temperature source Tympanic, resp. rate 19, height 5\' 8"  (1.727 m), weight 155 lb 9.6 oz (70.6 kg), SpO2 99 %.  LABORATORY DATA: Lab Results  Component Value Date   WBC 10.1 01/26/2021   HGB 12.5 (L) 01/26/2021   HCT 38.4 (L) 01/26/2021   MCV 95.8 01/26/2021  PLT 248 01/26/2021      Chemistry      Component Value Date/Time   NA 137 01/26/2021 0917   NA 141 03/08/2017 1707   K 3.6 01/26/2021 0917   CL 104 01/26/2021 0917   CO2 24 01/26/2021 0917   BUN 7 01/26/2021 0917   BUN 12 03/08/2017 1707   CREATININE 0.83 01/26/2021 0917      Component Value Date/Time   CALCIUM 9.2 01/26/2021 0917   ALKPHOS 66 01/26/2021 0917   AST 23 01/26/2021 0917   ALT 20 01/26/2021 0917   BILITOT 0.6 01/26/2021 0917       RADIOGRAPHIC STUDIES: No results found.   ASSESSMENT AND PLAN: This is a very pleasant 60 years old white male with a stage IV (t3, N0, M1c) non-small cell lung cancer, adenocarcinoma with MSI high presented with right lower lobe/infrahilar mass in addition to solitary brain metastasis in the left  cerebellum diagnosed in July 2021. He is status post SRS to the solitary brain metastasis. The patient completed a course of concurrent chemoradiation with weekly carboplatin and paclitaxel.  He tolerated the treatment well except for fatigue and mild odynophagia. The patient has MSI high and I recommended for him treatment with immunotherapy with single agent Keytruda 200 mg IV every 3 weeks for a total of 2 years unless the patient has unacceptable toxicity or disease progression. He is status post 15 cycles of treatment with Keytruda.  He also received 7 cycles of Avastin for the vasogenic edema in the brain.   Avastin will be on hold for now unless needed in the future. The patient has been tolerating his treatment with Keytruda fairly well except for the skin rash and mild itching. I recommended for him to proceed with cycle #16 today as planned. For the itching I advised him to apply hydrocortisone cream on as-needed basis and take Benadryl as needed. The patient will come back for follow-up visit in 3 weeks for evaluation before starting cycle #17. For the hypertension, he was advised to monitor his blood pressure closely at home and to discuss with his primary care provider for adjustment of his medication if needed. He was advised to call immediately if he has any concerning symptoms in the interval. The patient voices understanding of current disease status and treatment options and is in agreement with the current care plan.  All questions were answered. The patient knows to call the clinic with any problems, questions or concerns. We can certainly see the patient much sooner if necessary.  Disclaimer: This note was dictated with voice recognition software. Similar sounding words can inadvertently be transcribed and may not be corrected upon review.

## 2021-01-26 NOTE — Patient Instructions (Signed)
[facility]  Discharge Instructions: °Thank you for choosing [facility] to provide your oncology and hematology care.  ° °If you have a lab appointment with the Cancer Center, please go directly to the Cancer Center and check in at the registration area. °  °Wear comfortable clothing and clothing appropriate for easy access to any Portacath or PICC line.  ° °We strive to give you quality time with your provider. You may need to reschedule your appointment if you arrive late (15 or more minutes).  Arriving late affects you and other patients whose appointments are after yours.  Also, if you miss three or more appointments without notifying the office, you may be dismissed from the clinic at the provider’s discretion.    °  °For prescription refill requests, have your pharmacy contact our office and allow 72 hours for refills to be completed.   ° °Today you received the following chemotherapy and/or immunotherapy agent: Pembrolizumab (Keytruda) °  °To help prevent nausea and vomiting after your treatment, we encourage you to take your nausea medication as directed. ° °BELOW ARE SYMPTOMS THAT SHOULD BE REPORTED IMMEDIATELY: °*FEVER GREATER THAN 100.4 F (38 °C) OR HIGHER °*CHILLS OR SWEATING °*NAUSEA AND VOMITING THAT IS NOT CONTROLLED WITH YOUR NAUSEA MEDICATION °*UNUSUAL SHORTNESS OF BREATH °*UNUSUAL BRUISING OR BLEEDING °*URINARY PROBLEMS (pain or burning when urinating, or frequent urination) °*BOWEL PROBLEMS (unusual diarrhea, constipation, pain near the anus) °TENDERNESS IN MOUTH AND THROAT WITH OR WITHOUT PRESENCE OF ULCERS (sore throat, sores in mouth, or a toothache) °UNUSUAL RASH, SWELLING OR PAIN  °UNUSUAL VAGINAL DISCHARGE OR ITCHING  ° °Items with * indicate a potential emergency and should be followed up as soon as possible or go to the Emergency Department if any problems should occur. ° °Please show the CHEMOTHERAPY ALERT CARD or IMMUNOTHERAPY ALERT CARD at  check-in to the Emergency Department and triage nurse. ° °Should you have questions after your visit or need to cancel or reschedule your appointment, please contact [facility]  Dept: [phone]  and follow the prompts.  Office hours are 8:00 a.m. to 4:30 p.m. Monday - Friday. Please note that voicemails left after 4:00 p.m. may not be returned until the following business day.  We are closed weekends and major holidays. You have access to a nurse at all times for urgent questions. Please call the main number to the clinic Dept: [phone] and follow the prompts. ° ° °For any non-urgent questions, you may also contact your provider using MyChart. We now offer e-Visits for anyone 18 and older to request care online for non-urgent symptoms. For details visit [email or web address]. °  °Also download the MyChart app! Go to the app store, search "MyChart", open the app, select , and log in with your MyChart username and password. ° °Due to Covid, a mask is required upon entering the hospital/clinic. If you do not have a mask, one will be given to you upon arrival. For doctor visits, patients may have 1 support person aged 18 or older with them. For treatment visits, patients cannot have anyone with them due to current Covid guidelines and our immunocompromised population.  ° °

## 2021-01-27 ENCOUNTER — Telehealth: Payer: Self-pay | Admitting: Internal Medicine

## 2021-01-27 NOTE — Telephone Encounter (Signed)
Scheduled follow-up appointment per 10/4 los. Patient is aware.

## 2021-02-16 ENCOUNTER — Inpatient Hospital Stay (HOSPITAL_BASED_OUTPATIENT_CLINIC_OR_DEPARTMENT_OTHER): Payer: 59 | Admitting: Internal Medicine

## 2021-02-16 ENCOUNTER — Inpatient Hospital Stay: Payer: 59

## 2021-02-16 ENCOUNTER — Encounter: Payer: Self-pay | Admitting: Internal Medicine

## 2021-02-16 ENCOUNTER — Other Ambulatory Visit: Payer: Self-pay

## 2021-02-16 VITALS — BP 149/91 | HR 89 | Temp 97.6°F | Resp 18 | Wt 159.1 lb

## 2021-02-16 DIAGNOSIS — C3431 Malignant neoplasm of lower lobe, right bronchus or lung: Secondary | ICD-10-CM | POA: Diagnosis not present

## 2021-02-16 DIAGNOSIS — C3490 Malignant neoplasm of unspecified part of unspecified bronchus or lung: Secondary | ICD-10-CM | POA: Diagnosis not present

## 2021-02-16 DIAGNOSIS — C3491 Malignant neoplasm of unspecified part of right bronchus or lung: Secondary | ICD-10-CM

## 2021-02-16 DIAGNOSIS — Z5112 Encounter for antineoplastic immunotherapy: Secondary | ICD-10-CM

## 2021-02-16 DIAGNOSIS — Z95828 Presence of other vascular implants and grafts: Secondary | ICD-10-CM

## 2021-02-16 LAB — CBC WITH DIFFERENTIAL (CANCER CENTER ONLY)
Abs Immature Granulocytes: 0.04 10*3/uL (ref 0.00–0.07)
Basophils Absolute: 0.1 10*3/uL (ref 0.0–0.1)
Basophils Relative: 1 %
Eosinophils Absolute: 0.2 10*3/uL (ref 0.0–0.5)
Eosinophils Relative: 2 %
HCT: 36.1 % — ABNORMAL LOW (ref 39.0–52.0)
Hemoglobin: 11.9 g/dL — ABNORMAL LOW (ref 13.0–17.0)
Immature Granulocytes: 1 %
Lymphocytes Relative: 16 %
Lymphs Abs: 1.3 10*3/uL (ref 0.7–4.0)
MCH: 30.8 pg (ref 26.0–34.0)
MCHC: 33 g/dL (ref 30.0–36.0)
MCV: 93.5 fL (ref 80.0–100.0)
Monocytes Absolute: 0.9 10*3/uL (ref 0.1–1.0)
Monocytes Relative: 11 %
Neutro Abs: 5.6 10*3/uL (ref 1.7–7.7)
Neutrophils Relative %: 69 %
Platelet Count: 235 10*3/uL (ref 150–400)
RBC: 3.86 MIL/uL — ABNORMAL LOW (ref 4.22–5.81)
RDW: 12.5 % (ref 11.5–15.5)
WBC Count: 8.2 10*3/uL (ref 4.0–10.5)
nRBC: 0 % (ref 0.0–0.2)

## 2021-02-16 LAB — CMP (CANCER CENTER ONLY)
ALT: 16 U/L (ref 0–44)
AST: 19 U/L (ref 15–41)
Albumin: 3.6 g/dL (ref 3.5–5.0)
Alkaline Phosphatase: 67 U/L (ref 38–126)
Anion gap: 9 (ref 5–15)
BUN: 10 mg/dL (ref 6–20)
CO2: 24 mmol/L (ref 22–32)
Calcium: 9.1 mg/dL (ref 8.9–10.3)
Chloride: 104 mmol/L (ref 98–111)
Creatinine: 0.76 mg/dL (ref 0.61–1.24)
GFR, Estimated: >60 mL/min (ref >60)
Glucose, Bld: 114 mg/dL — ABNORMAL HIGH (ref 70–99)
Potassium: 3.5 mmol/L (ref 3.5–5.1)
Sodium: 137 mmol/L (ref 135–145)
Total Bilirubin: 0.7 mg/dL (ref 0.3–1.2)
Total Protein: 6.6 g/dL (ref 6.5–8.1)

## 2021-02-16 MED ORDER — SODIUM CHLORIDE 0.9% FLUSH
10.0000 mL | INTRAVENOUS | Status: DC | PRN
  Administered 2021-02-16: 10 mL

## 2021-02-16 MED ORDER — SODIUM CHLORIDE 0.9 % IV SOLN: Freq: Once | INTRAVENOUS | Status: AC

## 2021-02-16 MED ORDER — SODIUM CHLORIDE 0.9% FLUSH
10.0000 mL | Freq: Once | INTRAVENOUS | Status: AC
  Administered 2021-02-16: 10 mL

## 2021-02-16 MED ORDER — HEPARIN SOD (PORK) LOCK FLUSH 100 UNIT/ML IV SOLN
500.0000 [IU] | Freq: Once | INTRAVENOUS | Status: AC | PRN
  Administered 2021-02-16: 500 [IU]

## 2021-02-16 MED ORDER — SODIUM CHLORIDE 0.9 % IV SOLN
200.0000 mg | Freq: Once | INTRAVENOUS | Status: AC
  Administered 2021-02-16: 200 mg via INTRAVENOUS
  Filled 2021-02-16: qty 8

## 2021-02-16 NOTE — Progress Notes (Signed)
[place] Telephone:(336) [phone]   Fax:(336) [phone]  OFFICE PROGRESS NOTE  Joseph Sill, Joseph Atkins [address] [name] [address] [place] [address]  DIAGNOSIS: Stage IV (T3, N0, M1C) non-small cell lung cancer, adenocarcinoma.  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 He was diagnosed in July 2021.   Molecular Biomarkers:  MSI-High DETECTED Pembrolizumab Atezolizumab, Avelumab, Cemiplimab, Dostarlimab, Durvalumab, Ipilimumab, Nivolumab   STK11Splice Site SNV 5.9% Everolimus, Temsirolimus Yes   KRASG12D 1.7% Binimetinib Yes   [id number] 0.4%   Niraparib, Olaparib, Rucaparib, Talazoparib, Tazemetostat Yes   PRIOR THERAPY:  1) SRS to the solitary brain metastasis under the care of Dr. Lisbeth Renshaw. Last treatment 11/14/19. 2) Weekly concurrent chemoradiation with carboplatin for an AUC of 2, paclitaxel 45 mg/m2.  First dose expected on 11/25/2019. Status post 7 cycles, last dose was giving 01/06/2020 with partial response.    CURRENT THERAPY:  1)  Immunotherapy with Keytruda 200 mg IV every 3 weeks.  First dose February 10, 2020 for a patient with MSI high.  Status post 16  cycles. 2) Avastin 15 mg/KG every 3 weeks.  First dose today for the vasogenic edema of the brain.S/P 7 cycles.  INTERVAL HISTORY: Joseph Atkins 60 y.o. male returns to the clinic today for follow-up visit accompanied by his wife.  The patient is feeling fine today with no concerning complaints.  He has been tolerating his treatment with Keytruda fairly well.  He denied having any current chest pain, shortness of breath, cough or hemoptysis.  He has no nausea, vomiting, diarrhea or constipation.  He has no headache or visual changes.  He is here today for evaluation before starting cycle #17 of his treatment.  MEDICAL HISTORY: Past Medical History:  Diagnosis Date   Cancer ([place])    lung cancer    Diverticulosis    GERD (gastroesophageal reflux disease)    Hx of small bowel obstruction    Hyperlipidemia    Hypertension    Hypothyroidism    IBS (irritable bowel syndrome)    Substance abuse ([place])    Alcoholic, Drug addition   Thyroid disease     ALLERGIES:  is allergic to penicillins.  MEDICATIONS:  Current Outpatient Medications  Medication Sig Dispense Refill   albuterol (PROVENTIL HFA;VENTOLIN HFA) 108 (90 Base) MCG/ACT inhaler Inhale 2 puffs into the lungs every 6 (six) hours as needed for wheezing or shortness of breath. 1 Inhaler 0   ANORO ELLIPTA 62.5-25 MCG/INH AEPB 1 puff daily.     cyclobenzaprine (FLEXERIL) 10 MG tablet Take 10 mg by mouth 2 (two) times daily as needed for muscle spasms.     docusate sodium (COLACE) 100 MG capsule Take 100 mg by mouth daily.     DULoxetine (CYMBALTA) 20 MG capsule Take 1 capsule (20 mg total) by mouth 2 (two) times daily. 60 capsule 5   fluticasone furoate-vilanterol (BREO ELLIPTA) 100-25 MCG/INH AEPB Inhale 1 puff into the lungs daily. 30 each 3   levothyroxine (SYNTHROID) 125 MCG tablet Take 1 tablet (125 mcg total) by mouth daily. 30 tablet 1   lidocaine-prilocaine (EMLA) cream Apply 1 application topically as needed. 30 g 0   omeprazole (PRILOSEC) 40 MG capsule Take 1 capsule (40 mg total) by mouth daily. 90 capsule 4   omeprazole (PRILOSEC) 40 MG capsule 1 capsule     ondansetron (ZOFRAN) 4 MG tablet Take 1 tablet (4  mg total) by mouth every 8 (eight) hours as needed. (Patient not taking: No sig reported) 40 tablet 2   oxyCODONE-acetaminophen (PERCOCET/ROXICET) 5-325 MG tablet Take 1 tablet by mouth every 4 (four) hours as needed for severe pain. 30 tablet 0   pembrolizumab (KEYTRUDA) 100 MG/4ML SOLN See admin instructions.     polyethylene glycol powder (MIRALAX) powder Take 17 g by mouth daily. 255 g 11   prochlorperazine (COMPAZINE) 10 MG tablet Take 1 tablet (10 mg total) by mouth every 6 (six) hours as needed. (Patient not  taking: No sig reported) 30 tablet 2   TRULANCE 3 MG TABS Take 1 tablet by mouth daily.     zolpidem (AMBIEN CR) 12.5 MG CR tablet Take 1 tablet (12.5 mg total) by mouth at bedtime as needed for sleep. 30 tablet 5   No current facility-administered medications for this visit.    SURGICAL HISTORY:  Past Surgical History:  Procedure Laterality Date   arm surgery Right    BRONCHIAL BRUSHINGS  10/24/2019   Procedure: BRONCHIAL BRUSHINGS;  Surgeon: Collene Gobble, MD;  Location: [facility] ENDOSCOPY;  Service: Cardiopulmonary;;  right lower lobe    BRONCHIAL BRUSHINGS  11/05/2019   Procedure: BRONCHIAL BRUSHINGS;  Surgeon: Collene Gobble, MD;  Location[facility] ENDOSCOPY;  Service: Pulmonary;;   BRONCHIAL NEEDLE ASPIRATION BIOPSY  10/24/2019   Procedure: BRONCHIAL NEEDLE ASPIRATION BIOPSIES;  Surgeon: Collene Gobble, MD;  Location: [place];  Service: Cardiopulmonary;;   BRONCHIAL NEEDLE ASPIRATION BIOPSY  11/05/2019   Procedure: BRONCHIAL NEEDLE ASPIRATION BIOPSIES;  Surgeon: Collene Gobble, MD;  Location: [facility] ENDOSCOPY;  Service: Pulmonary;;   ENDOBRONCHIAL ULTRASOUND N/A 10/24/2019   Procedure: ENDOBRONCHIAL ULTRASOUND;  Surgeon: Collene Gobble, MD;  Location: [place];  Service: Cardiopulmonary;  Laterality: N/A;   FINGER SURGERY Right    Middle   IR IMAGING GUIDED PORT INSERTION  11/19/2019   VIDEO BRONCHOSCOPY N/A 10/24/2019   Procedure: VIDEO BRONCHOSCOPY WITHOUT FLUORO;  Surgeon: Collene Gobble, MD;  Location: [place];  Service: Cardiopulmonary;  Laterality: N/A;   VIDEO BRONCHOSCOPY WITH ENDOBRONCHIAL NAVIGATION N/A 11/05/2019   Procedure: VIDEO BRONCHOSCOPY WITH ENDOBRONCHIAL NAVIGATION;  Surgeon: Collene Gobble, MD;  Location: [facility] ENDOSCOPY;  Service: Pulmonary;  Laterality: N/A;    REVIEW OF SYSTEMS:  A comprehensive review of systems was negative.   PHYSICAL EXAMINATION: General appearance: alert, cooperative, and no distress Head: Normocephalic, without obvious abnormality,  atraumatic Neck: no adenopathy, no JVD, supple, symmetrical, trachea midline, and thyroid not enlarged, symmetric, no tenderness/mass/nodules Lymph nodes: Cervical, supraclavicular, and axillary nodes normal. Resp: clear to auscultation bilaterally Back: symmetric, no curvature. ROM normal. No CVA tenderness. Cardio: regular rate and rhythm, S1, S2 normal, no murmur, click, rub or gallop GI: soft, non-tender; bowel sounds normal; no masses,  no organomegaly Extremities: extremities normal, atraumatic, no cyanosis or edema  ECOG PERFORMANCE STATUS: 1 - Symptomatic but completely ambulatory  Blood pressure (!) 149/91, pulse 89, temperature 97.6 F (36.4 C), temperature source Oral, resp. rate 18, weight 159 lb 1.6 oz (72.2 kg), SpO2 98 %.  LABORATORY DATA: Lab Results  Component Value Date   WBC 8.2 02/16/2021   HGB 11.9 (L) 02/16/2021   HCT 36.1 (L) 02/16/2021   MCV 93.5 02/16/2021   PLT 235 02/16/2021      Chemistry      Component Value Date/Time   NA 137 02/16/2021 1056   NA 141 03/08/2017 1707   K 3.5 02/16/2021 1056   CL 104 02/16/2021  1056   CO2 24 02/16/2021 1056   BUN 10 02/16/2021 1056   BUN 12 03/08/2017 1707   CREATININE 0.76 02/16/2021 1056      Component Value Date/Time   CALCIUM 9.1 02/16/2021 1056   ALKPHOS 67 02/16/2021 1056   AST 19 02/16/2021 1056   ALT 16 02/16/2021 1056   BILITOT 0.7 02/16/2021 1056       RADIOGRAPHIC STUDIES: No results found.   ASSESSMENT AND PLAN: This is a very pleasant 60 years old white male with a stage IV (t3, N0, M1c) non-small cell lung cancer, adenocarcinoma with MSI high presented with right lower lobe/infrahilar mass in addition to solitary brain metastasis in the left cerebellum diagnosed in July 2021. He is status post SRS to the solitary brain metastasis. The patient completed a course of concurrent chemoradiation with weekly carboplatin and paclitaxel.  He tolerated the treatment well except for fatigue and mild  odynophagia. The patient has MSI high and I recommended for him treatment with immunotherapy with single agent Keytruda 200 mg IV every 3 weeks for a total of 2 years unless the patient has unacceptable toxicity or disease progression. He is status post 16 cycles of treatment with Keytruda.  He also received 7 cycles of Avastin for the vasogenic edema in the brain.   Avastin will be on hold for now unless needed in the future. He has been tolerating his treatment well with no concerning adverse effects. I recommended for the patient to proceed with cycle #17 today as planned. I will see him back for follow-up visit in 3 weeks for evaluation with repeat CT scan of the chest, abdomen pelvis for restaging of his disease. The patient was advised to call immediately if he has any concerning symptoms in the interval. The patient voices understanding of current disease status and treatment options and is in agreement with the current care plan.  All questions were answered. The patient knows to call the clinic with any problems, questions or concerns. We can certainly see the patient much sooner if necessary.  Disclaimer: This note was dictated with voice recognition software. Similar sounding words can inadvertently be transcribed and may not be corrected upon review.

## 2021-02-16 NOTE — Patient Instructions (Signed)
Steps to Quit Smoking Smoking tobacco is the leading cause of preventable death. It can affect almost every organ in the body. Smoking puts you and people around you at risk for many serious, long-lasting (chronic) diseases. Quitting smoking can be hard, but it is one of the best things that you can do for your health. It is never too late to quit. How do I get ready to quit? When you decide to quit smoking, make a plan to help you succeed. Before you quit: Pick a date to quit. Set a date within the next 2 weeks to give you time to prepare. Write down the reasons why you are quitting. Keep this list in places where you will see it often. Tell your family, friends, and co-workers that you are quitting. Their support is important. Talk with your doctor about the choices that may help you quit. Find out if your health insurance will pay for these treatments. Know the people, places, things, and activities that make you want to smoke (triggers). Avoid them. What first steps can I take to quit smoking? Throw away all cigarettes at home, at work, and in your car. Throw away the things that you use when you smoke, such as ashtrays and lighters. Clean your car. Make sure to empty the ashtray. Clean your home, including curtains and carpets. What can I do to help me quit smoking? Talk with your doctor about taking medicines and seeing a counselor at the same time. You are more likely to succeed when you do both. If you are pregnant or breastfeeding, talk with your doctor about counseling or other ways to quit smoking. Do not take medicine to help you quit smoking unless your doctor tells you to do so. To quit smoking: Quit right away Quit smoking totally, instead of slowly cutting back on how much you smoke over a period of time. Go to counseling. You are more likely to quit if you go to counseling sessions regularly. Take medicine You may take medicines to help you quit. Some medicines need a  prescription, and some you can buy over-the-counter. Some medicines may contain a drug called nicotine to replace the nicotine in cigarettes. Medicines may: Help you to stop having the desire to smoke (cravings). Help to stop the problems that come when you stop smoking (withdrawal symptoms). Your doctor may ask you to use: Nicotine patches, gum, or lozenges. Nicotine inhalers or sprays. Non-nicotine medicine that is taken by mouth. Find resources Find resources and other ways to help you quit smoking and remain smoke-free after you quit. These resources are most helpful when you use them often. They include: Online chats with a counselor. Phone quitlines. Printed self-help materials. Support groups or group counseling. Text messaging programs. Mobile phone apps. Use apps on your mobile phone or tablet that can help you stick to your quit plan. There are many free apps for mobile phones and tablets as well as websites. Examples include Quit Guide from the CDC and smokefree.gov  What things can I do to make it easier to quit?  Talk to your family and friends. Ask them to support and encourage you. Call a phone quitline (1-800-QUIT-NOW), reach out to support groups, or work with a counselor. Ask people who smoke to not smoke around you. Avoid places that make you want to smoke, such as: Bars. Parties. Smoke-break areas at work. Spend time with people who do not smoke. Lower the stress in your life. Stress can make you want to   smoke. Try these things to help your stress: Getting regular exercise. Doing deep-breathing exercises. Doing yoga. Meditating. Doing a body scan. To do this, close your eyes, focus on one area of your body at a time from head to toe. Notice which parts of your body are tense. Try to relax the muscles in those areas. How will I feel when I quit smoking? Day 1 to 3 weeks Within the first 24 hours, you may start to have some problems that come from quitting tobacco.  These problems are very bad 2-3 days after you quit, but they do not often last for more than 2-3 weeks. You may get these symptoms: Mood swings. Feeling restless, nervous, angry, or annoyed. Trouble concentrating. Dizziness. Strong desire for high-sugar foods and nicotine. Weight gain. Trouble pooping (constipation). Feeling like you may vomit (nausea). Coughing or a sore throat. Changes in how the medicines that you take for other issues work in your body. Depression. Trouble sleeping (insomnia). Week 3 and afterward After the first 2-3 weeks of quitting, you may start to notice more positive results, such as: Better sense of smell and taste. Less coughing and sore throat. Slower heart rate. Lower blood pressure. Clearer skin. Better breathing. Fewer sick days. Quitting smoking can be hard. Do not give up if you fail the first time. Some people need to try a few times before they succeed. Do your best to stick to your quit plan, and talk with your doctor if you have any questions or concerns. Summary Smoking tobacco is the leading cause of preventable death. Quitting smoking can be hard, but it is one of the best things that you can do for your health. When you decide to quit smoking, make a plan to help you succeed. Quit smoking right away, not slowly over a period of time. When you start quitting, seek help from your doctor, family, or friends. This information is not intended to replace advice given to you by your health care provider. Make sure you discuss any questions you have with your health care provider. Document Revised: 01/04/2019 Document Reviewed: 06/30/2018 Elsevier Patient Education  2022 [employer]  

## 2021-02-16 NOTE — Patient Instructions (Signed)
[name]  Discharge Instructions: Thank you for choosing [facility] to provide your oncology and hematology care.   If you have a lab appointment with the [facility], please go directly to the [facility] and check in at the registration area.   Wear comfortable clothing and clothing appropriate for easy access to any Portacath or PICC line.   We strive to give you quality time with your provider. You may need to reschedule your appointment if you arrive late (15 or more minutes).  Arriving late affects you and other patients whose appointments are after yours.  Also, if you miss three or more appointments without notifying the office, you may be dismissed from the clinic at the provider's discretion.      For prescription refill requests, have your pharmacy contact our office and allow 72 hours for refills to be completed.    Today you received the following chemotherapy and/or immunotherapy agents [facility]      To help prevent nausea and vomiting after your treatment, we encourage you to take your nausea medication as directed.  BELOW ARE SYMPTOMS THAT SHOULD BE REPORTED IMMEDIATELY: *FEVER GREATER THAN 100.4 F (38 C) OR HIGHER *CHILLS OR SWEATING *NAUSEA AND VOMITING THAT IS NOT CONTROLLED WITH YOUR NAUSEA MEDICATION *UNUSUAL SHORTNESS OF BREATH *UNUSUAL BRUISING OR BLEEDING *URINARY PROBLEMS (pain or burning when urinating, or frequent urination) *BOWEL PROBLEMS (unusual diarrhea, constipation, pain near the anus) TENDERNESS IN MOUTH AND THROAT WITH OR WITHOUT PRESENCE OF ULCERS (sore throat, sores in mouth, or a toothache) UNUSUAL RASH, SWELLING OR PAIN  UNUSUAL VAGINAL DISCHARGE OR ITCHING   Items with * indicate a potential emergency and should be followed up as soon as possible or go to the Emergency Department if any problems should occur.  Please show the CHEMOTHERAPY ALERT CARD or IMMUNOTHERAPY ALERT CARD at check-in to  the Emergency Department and triage nurse.  Should you have questions after your visit or need to cancel or reschedule your appointment, please contact [facility]  Dept: [phone]  and follow the prompts.  Office hours are 8:00 a.m. to 4:30 p.m. Monday - Friday. Please note that voicemails left after 4:00 p.m. may not be returned until the following business day.  We are closed weekends and major holidays. You have access to a nurse at all times for urgent questions. Please call the main number to the clinic Dept: [phone] and follow the prompts.   For any non-urgent questions, you may also contact your provider using MyChart. We now offer e-Visits for anyone 67 and older to request care online for non-urgent symptoms. For details visit [email or web address].   Also download the MyChart app! Go to the app store, search "MyChart", open the app, select [facility], and log in with your MyChart username and password.  Due to Covid, a mask is required upon entering the hospital/clinic. If you do not have a mask, one will be given to you upon arrival. For doctor visits, patients may have 1 support person aged 33 or older with them. For treatment visits, patients cannot have anyone with them due to current Covid guidelines and our immunocompromised population.

## 2021-02-17 ENCOUNTER — Telehealth: Payer: Self-pay | Admitting: Internal Medicine

## 2021-02-17 NOTE — Telephone Encounter (Signed)
Left message with follow-up appointments per 10/25 los.

## 2021-03-04 ENCOUNTER — Encounter: Payer: Self-pay | Admitting: Physician Assistant

## 2021-03-04 ENCOUNTER — Encounter: Payer: Self-pay | Admitting: Internal Medicine

## 2021-03-05 ENCOUNTER — Ambulatory Visit (HOSPITAL_COMMUNITY): Payer: Managed Care, Other (non HMO)

## 2021-03-05 ENCOUNTER — Encounter (HOSPITAL_COMMUNITY): Payer: Self-pay

## 2021-03-05 ENCOUNTER — Ambulatory Visit (HOSPITAL_COMMUNITY)
Admission: RE | Admit: 2021-03-05 | Discharge: 2021-03-05 | Disposition: A | Discharge status: Home / Self Care | Payer: Managed Care, Other (non HMO) | Source: Ambulatory Visit | Attending: Internal Medicine | Admitting: Internal Medicine

## 2021-03-05 ENCOUNTER — Other Ambulatory Visit: Payer: Self-pay

## 2021-03-05 DIAGNOSIS — C3490 Malignant neoplasm of unspecified part of unspecified bronchus or lung: Secondary | ICD-10-CM | POA: Insufficient documentation

## 2021-03-05 IMAGING — CT CT ABD-PELV W/ CM
2 of 5 series · 12 of 36 positions shown, 15 images · IV contrast (APPLIED)
Comparison: CT of the chest, abdomen and pelvis [DATE].

CLINICAL DATA: 60-year-old male with history of non-small cell lung
cancer status post chemotherapy and radiation therapy now complete.
Ongoing immunotherapy.

EXAM:
CT CHEST, ABDOMEN, AND PELVIS WITH CONTRAST
TECHNIQUE: Multidetector CT imaging of the chest, abdomen and pelvis was
performed following the standard protocol during bolus
administration of intravenous contrast.
CONTRAST:  80mL OMNIPAQUE IOHEXOL 350 MG/ML SOLN

[Series 2: cap with · axial · 0.78mm/px · z∈[-637,-82]mm · 9 of 139 slices shown, 12 images]
[im 14/139  mediastinal]
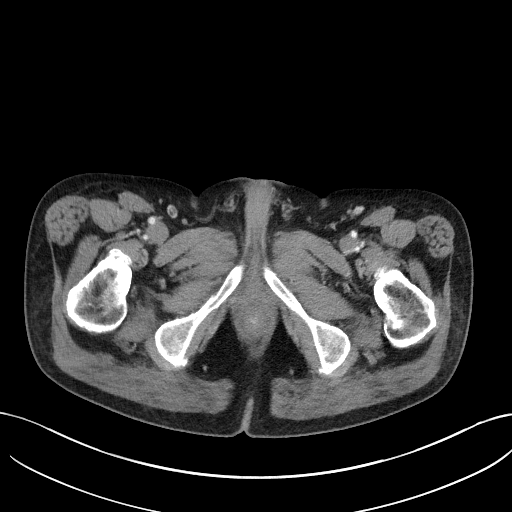
[im 14/139  lung]
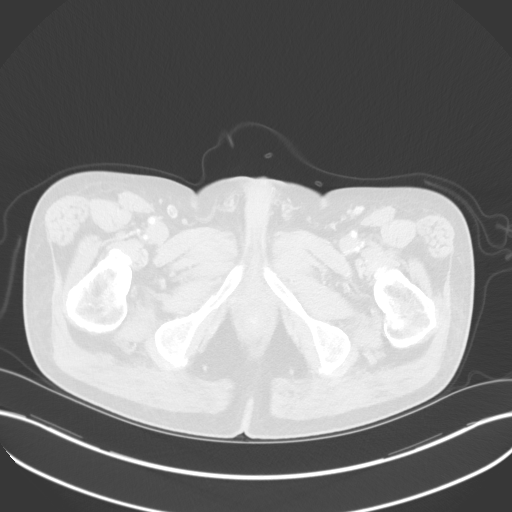
[im 28/139  lung]
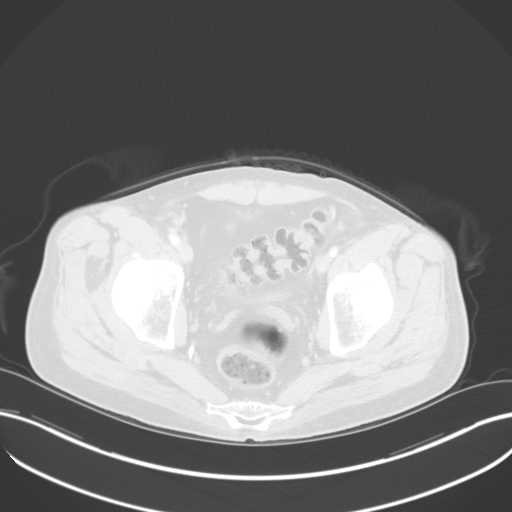
[im 42/139  lung]
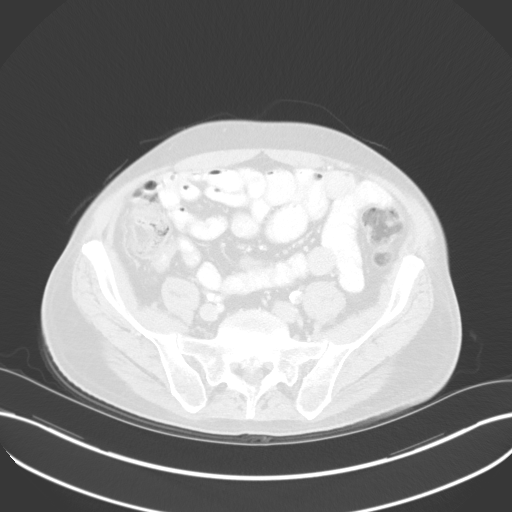
[im 56/139  lung]
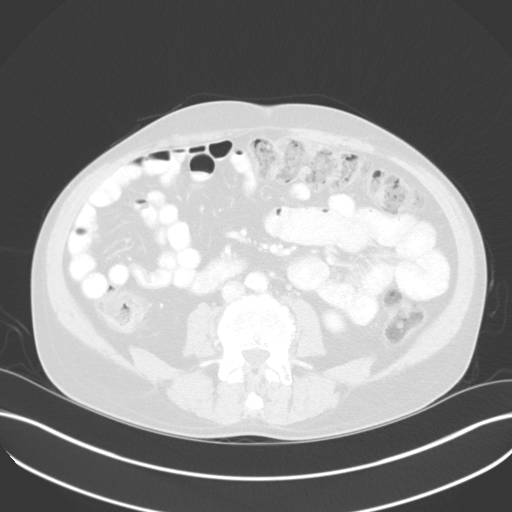
[im 70/139  mediastinal]
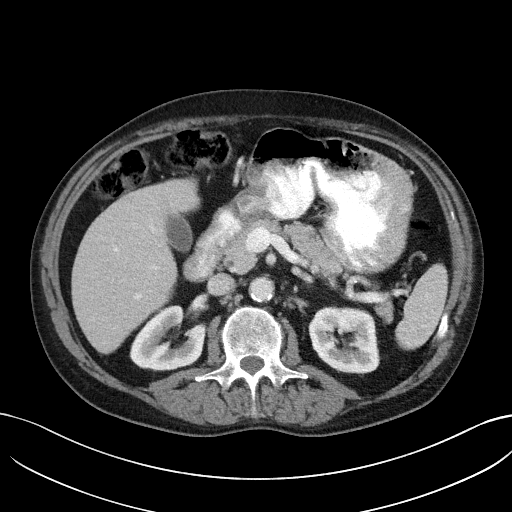
[im 70/139  lung]
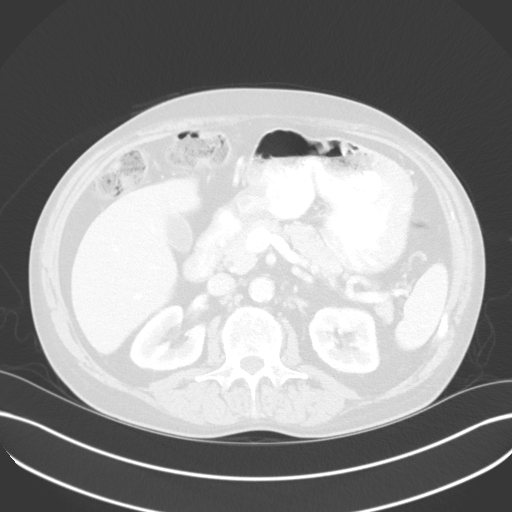
[im 83/139  lung]
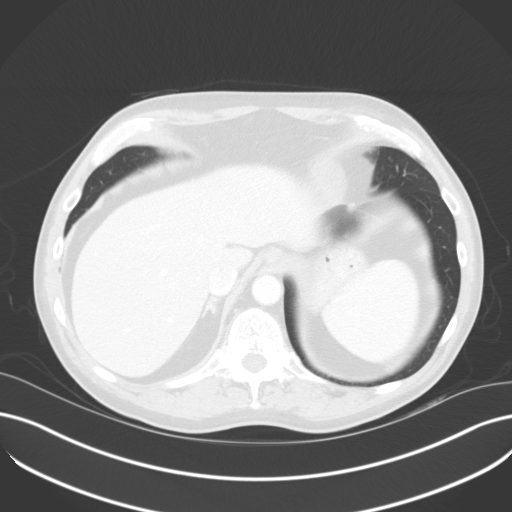
[im 97/139  lung]
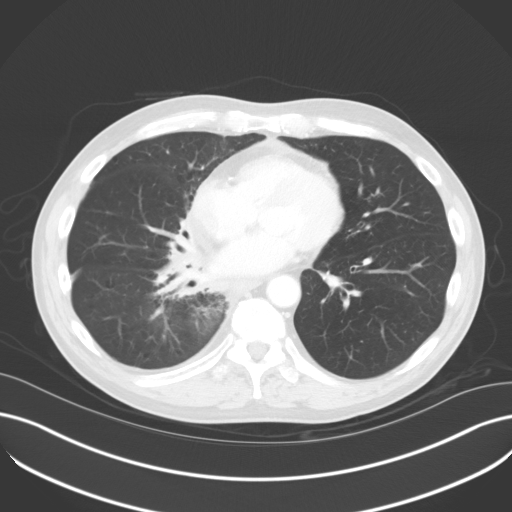
[im 111/139  lung]
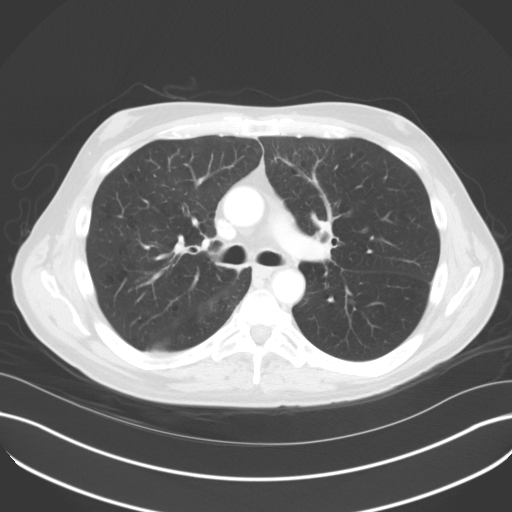
[im 125/139  mediastinal]
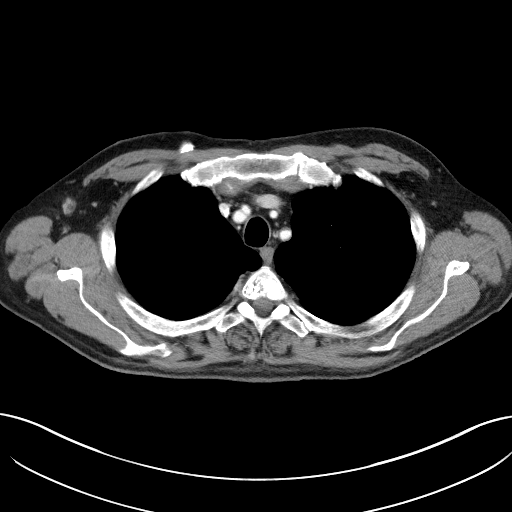
[im 125/139  lung]
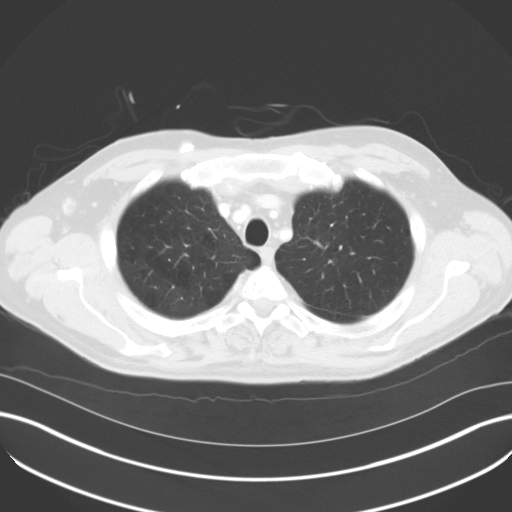

[Series 4: coronals · coronal · 0.83mm/px · 3 of 145 slices shown]
[im 29/145  lung]
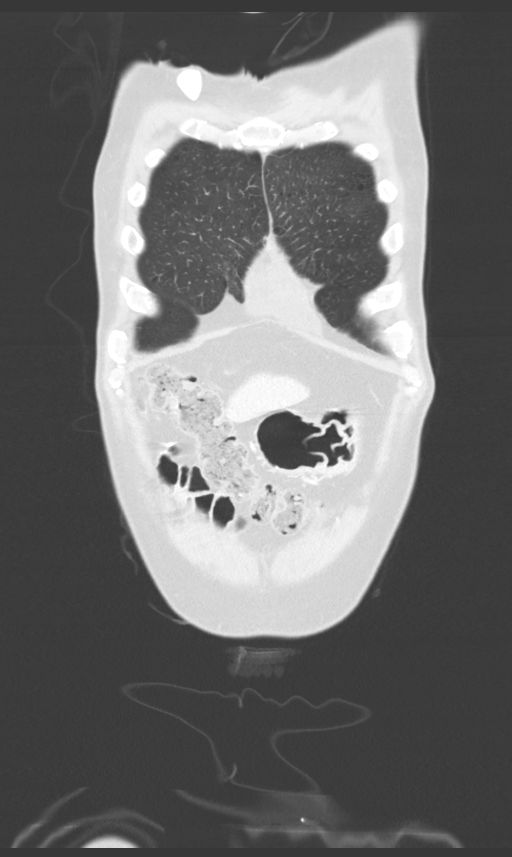
[im 58/145  lung]
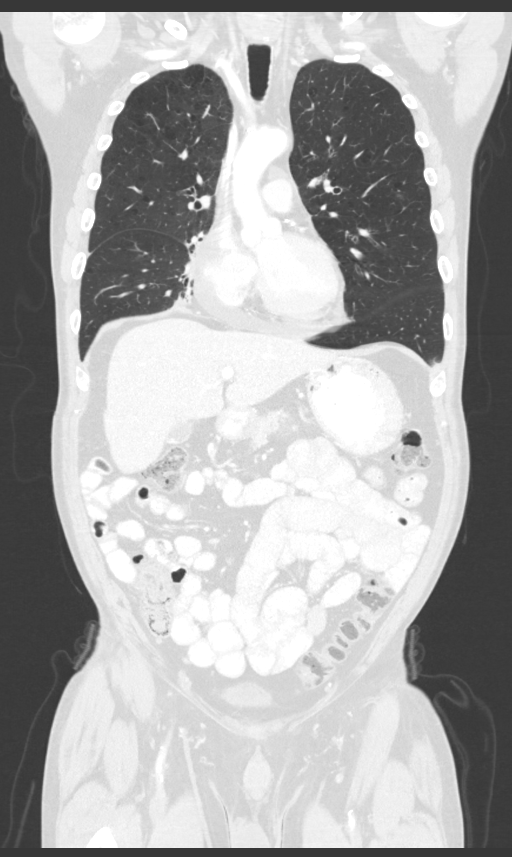
[im 87/145  lung]
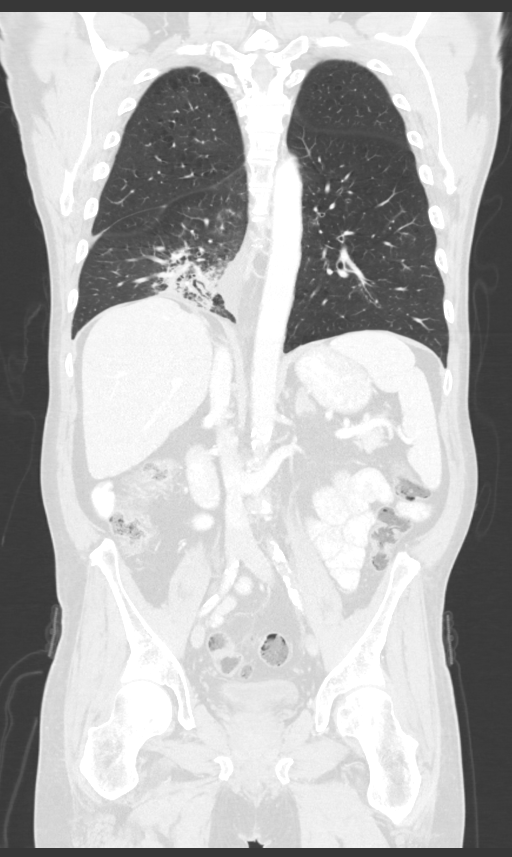

[12 of 36 positions shown; findings below may reference images not displayed]

FINDINGS: CT CHEST FINDINGS

Cardiovascular: Heart size is normal. Small to moderate volume of
pericardial fluid and thickening, similar to the prior study. No
pericardial calcification. There is aortic atherosclerosis, as well
as atherosclerosis of the great vessels of the mediastinum and the
coronary arteries, including calcified atherosclerotic plaque in the
left main, left anterior descending and right coronary arteries.
Right internal jugular single-lumen porta cath with tip terminating
in the right atrium.

Mediastinum/Nodes: No pathologically enlarged mediastinal or hilar
lymph nodes. Esophagus is unremarkable in appearance. No axillary
lymphadenopathy.

Lungs/Pleura: Treated right lower lobe mass appears grossly similar
in size to the prior examination, estimated to measure approximately
3.9 x 2.9 cm on today's study (axial image 45 of series 2).
Surrounding areas with mild traction bronchiectasis, extensive
thickening of the peribronchovascular interstitium, ground-glass
attenuation and surrounding areas of septal thickening noted in the
central portion of the right middle lobe and right lower lobe,
compatible with chronic postradiation mass-like fibrosis, similar to
the prior examination. No other suspicious appearing pulmonary
nodules or masses are noted. No acute consolidative airspace
disease. Trace amount of right-sided pleural fluid and thickening,
stable compared to the prior study. No left-sided pleural fluid.

Musculoskeletal: There are no aggressive appearing lytic or blastic
lesions noted in the visualized portions of the skeleton.

CT ABDOMEN PELVIS FINDINGS

Hepatobiliary: Small ovoid shaped low-attenuation lesion in the
central aspect of the left lobe of the liver, stable in size and
appearance compared to prior studies, too small to definitively
characterize, but favored to represent a small cyst. No other
suspicious appearing hepatic lesions. No intra or extrahepatic
biliary ductal dilatation. Partially calcified gallstone in the
gallbladder. No findings to suggest an acute cholecystitis at this
time.

Pancreas: No pancreatic mass. No pancreatic ductal dilatation. No
pancreatic or peripancreatic fluid collections or inflammatory
changes.

Spleen: Unremarkable.

Adrenals/Urinary Tract: Bilateral kidneys and adrenal glands are
normal in appearance. No hydroureteronephrosis. Urinary bladder is
normal in appearance.

Stomach/Bowel: The appearance of the stomach is normal. No
pathologic dilatation of small bowel or colon. Normal appendix.

Vascular/Lymphatic: Aortic atherosclerosis, without evidence of
aneurysm or dissection in the abdominal or pelvic vasculature.
Complete occlusion of the left common iliac, left internal iliac and
left external iliac arteries, with distal reconstitution of flow
from collateral pathways, similar to prior examinations. Retroaortic
left renal vein (normal anatomical variant) incidentally noted. No
lymphadenopathy noted in the abdomen or pelvis.

Reproductive: Prostate gland and seminal vesicles are unremarkable
in appearance.

Other: No significant volume of ascites.  No pneumoperitoneum.

Musculoskeletal: There are no aggressive appearing lytic or blastic
lesions noted in the visualized portions of the skeleton.
IMPRESSION: 1. Stable treatment related changes of postradiation mass-like
fibrosis in the medial aspect of the right lung base, with no
definitive findings to suggest locally recurrent disease or new
metastatic disease in the chest, abdomen or pelvis.
2. Small amount of pericardial fluid and/or thickening, similar to
the prior study.
3. Aortic atherosclerosis, in addition to left main and 2 vessel
coronary artery disease. Chronic occlusion of the left common iliac,
internal iliac and external iliac arteries with distal
reconstitution of flow from collateral pathways, similar to prior
studies.
4. Cholelithiasis without evidence of acute cholecystitis at this
time.
5. Additional incidental findings, as above.

## 2021-03-05 IMAGING — CT CT CHEST W/ CM
2 of 5 series · 12 of 36 positions shown, 15 images · IV contrast (APPLIED)
Comparison: CT of the chest, abdomen and pelvis [DATE].

CLINICAL DATA: 60-year-old male with history of non-small cell lung
cancer status post chemotherapy and radiation therapy now complete.
Ongoing immunotherapy.

EXAM:
CT CHEST, ABDOMEN, AND PELVIS WITH CONTRAST
TECHNIQUE: Multidetector CT imaging of the chest, abdomen and pelvis was
performed following the standard protocol during bolus
administration of intravenous contrast.
CONTRAST:  80mL OMNIPAQUE IOHEXOL 350 MG/ML SOLN

[Series 2: cap with · axial · 0.78mm/px · z∈[-637,-82]mm · 9 of 139 slices shown, 12 images]
[im 14/139  mediastinal]
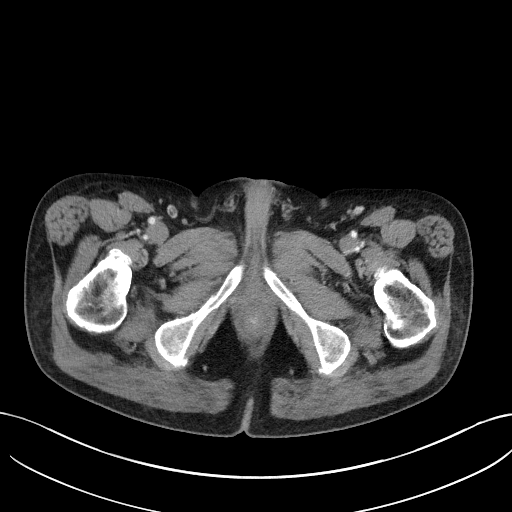
[im 14/139  lung]
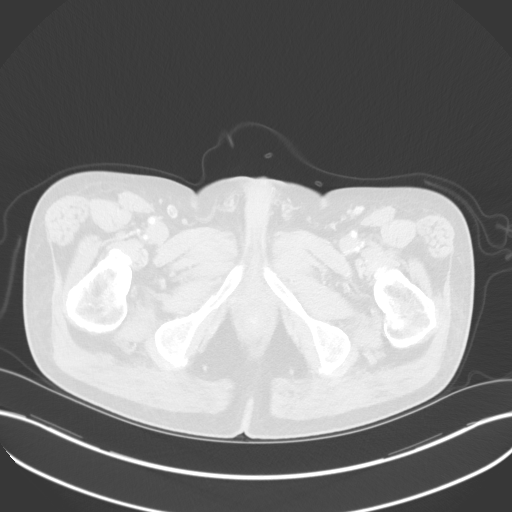
[im 28/139  lung]
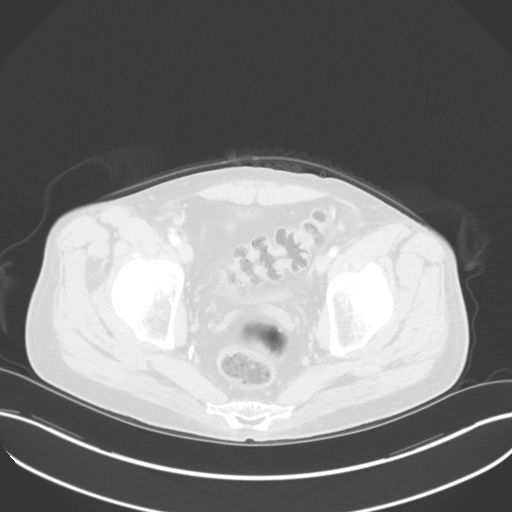
[im 42/139  lung]
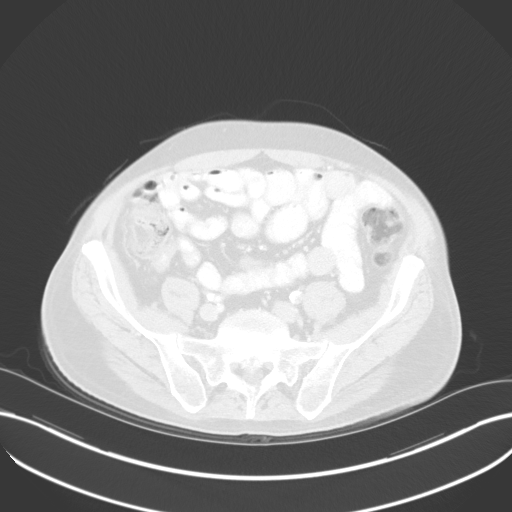
[im 56/139  lung]
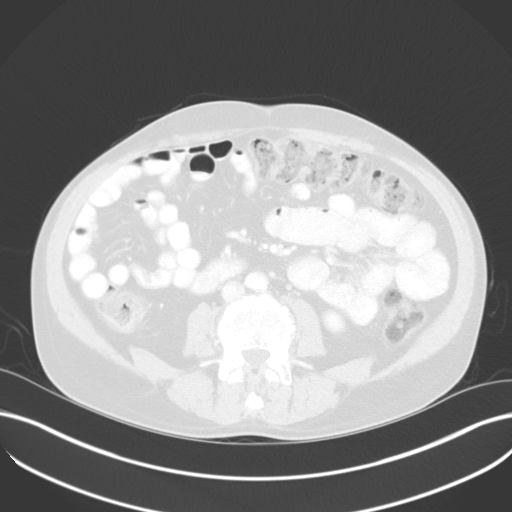
[im 70/139  mediastinal]
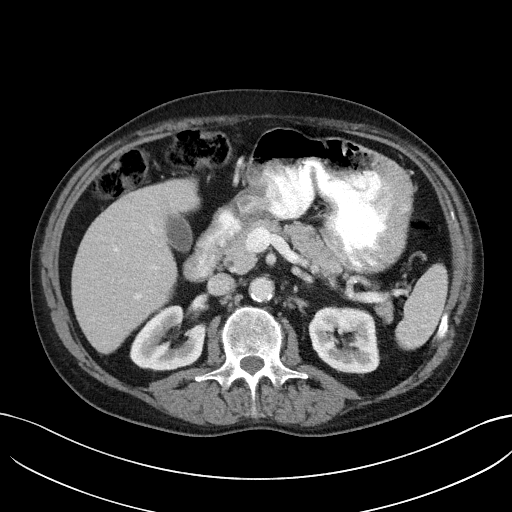
[im 70/139  lung]
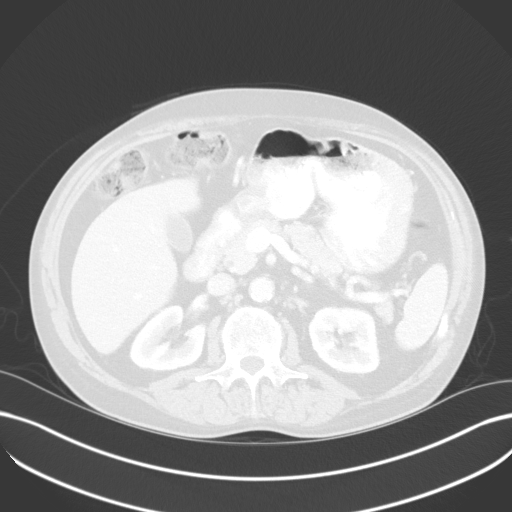
[im 83/139  lung]
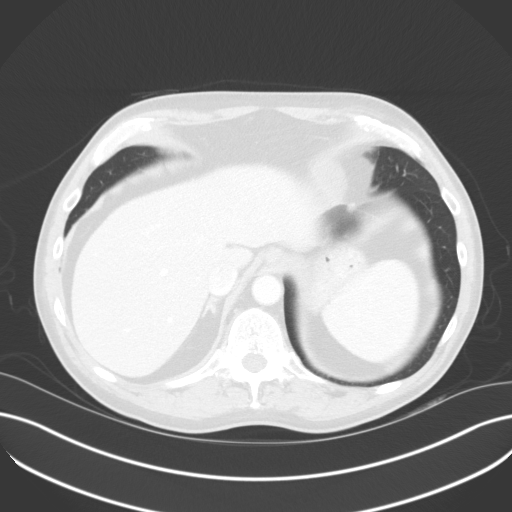
[im 97/139  lung]
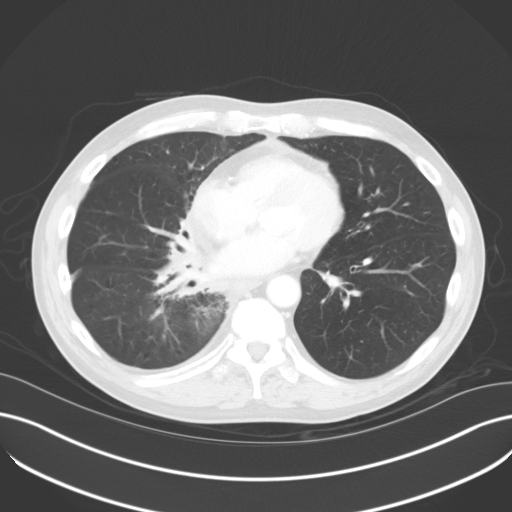
[im 111/139  lung]
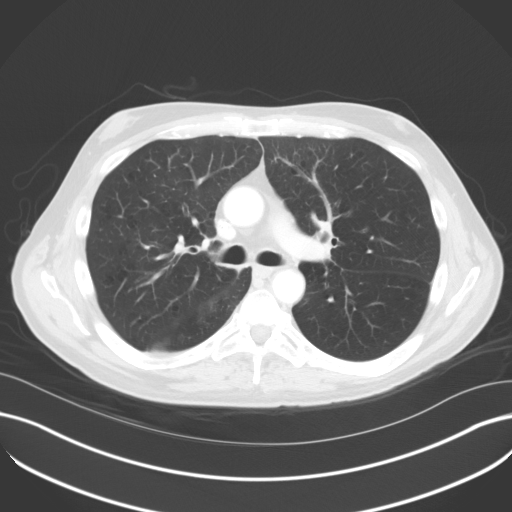
[im 125/139  mediastinal]
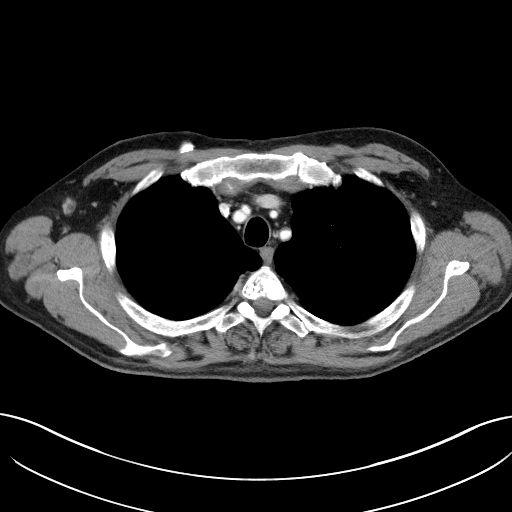
[im 125/139  lung]
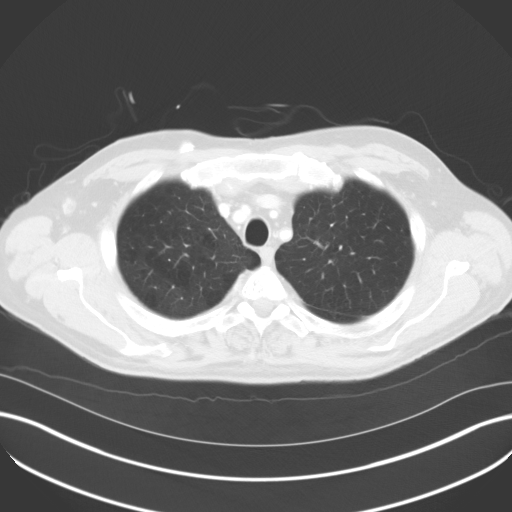

[Series 4: coronals · coronal · 0.83mm/px · 3 of 145 slices shown]
[im 29/145  lung]
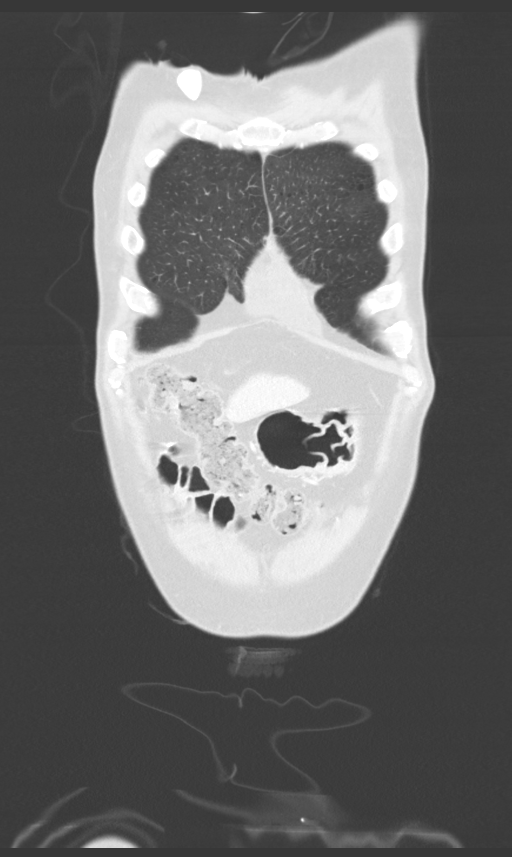
[im 58/145  lung]
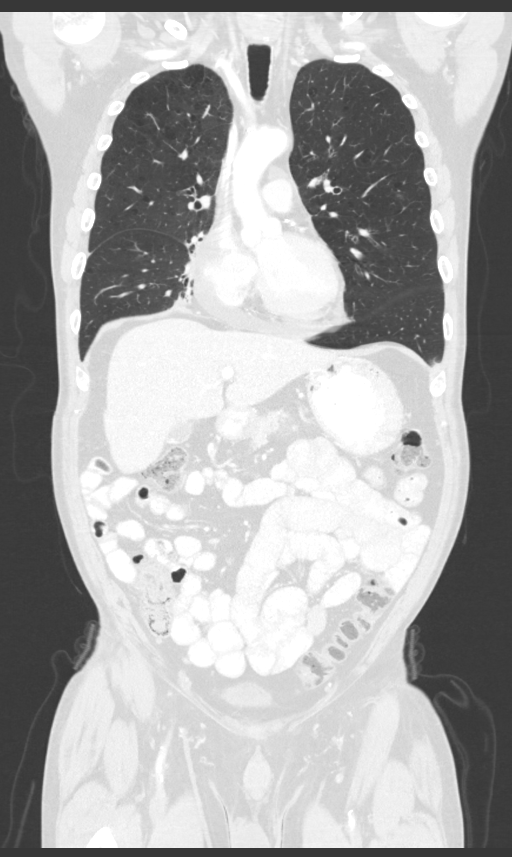
[im 87/145  lung]
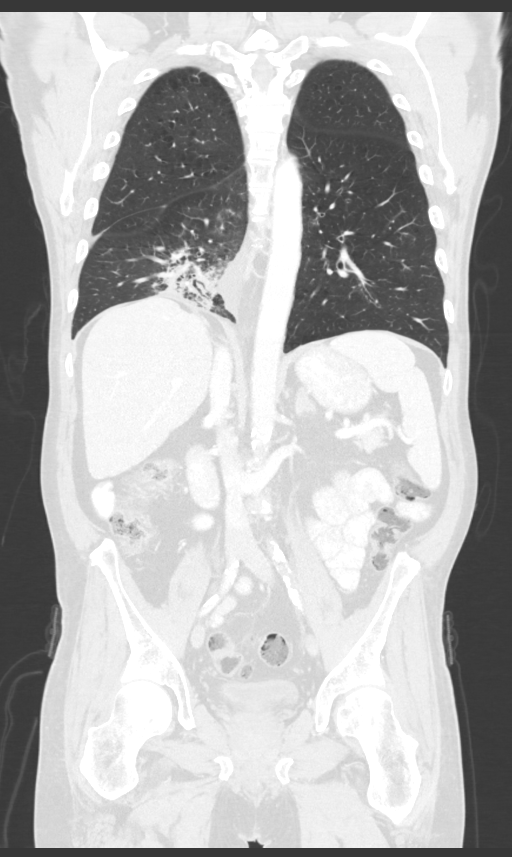

[12 of 36 positions shown; findings below may reference images not displayed]

FINDINGS: CT CHEST FINDINGS

Cardiovascular: Heart size is normal. Small to moderate volume of
pericardial fluid and thickening, similar to the prior study. No
pericardial calcification. There is aortic atherosclerosis, as well
as atherosclerosis of the great vessels of the mediastinum and the
coronary arteries, including calcified atherosclerotic plaque in the
left main, left anterior descending and right coronary arteries.
Right internal jugular single-lumen porta cath with tip terminating
in the right atrium.

Mediastinum/Nodes: No pathologically enlarged mediastinal or hilar
lymph nodes. Esophagus is unremarkable in appearance. No axillary
lymphadenopathy.

Lungs/Pleura: Treated right lower lobe mass appears grossly similar
in size to the prior examination, estimated to measure approximately
3.9 x 2.9 cm on today's study (axial image 45 of series 2).
Surrounding areas with mild traction bronchiectasis, extensive
thickening of the peribronchovascular interstitium, ground-glass
attenuation and surrounding areas of septal thickening noted in the
central portion of the right middle lobe and right lower lobe,
compatible with chronic postradiation mass-like fibrosis, similar to
the prior examination. No other suspicious appearing pulmonary
nodules or masses are noted. No acute consolidative airspace
disease. Trace amount of right-sided pleural fluid and thickening,
stable compared to the prior study. No left-sided pleural fluid.

Musculoskeletal: There are no aggressive appearing lytic or blastic
lesions noted in the visualized portions of the skeleton.

CT ABDOMEN PELVIS FINDINGS

Hepatobiliary: Small ovoid shaped low-attenuation lesion in the
central aspect of the left lobe of the liver, stable in size and
appearance compared to prior studies, too small to definitively
characterize, but favored to represent a small cyst. No other
suspicious appearing hepatic lesions. No intra or extrahepatic
biliary ductal dilatation. Partially calcified gallstone in the
gallbladder. No findings to suggest an acute cholecystitis at this
time.

Pancreas: No pancreatic mass. No pancreatic ductal dilatation. No
pancreatic or peripancreatic fluid collections or inflammatory
changes.

Spleen: Unremarkable.

Adrenals/Urinary Tract: Bilateral kidneys and adrenal glands are
normal in appearance. No hydroureteronephrosis. Urinary bladder is
normal in appearance.

Stomach/Bowel: The appearance of the stomach is normal. No
pathologic dilatation of small bowel or colon. Normal appendix.

Vascular/Lymphatic: Aortic atherosclerosis, without evidence of
aneurysm or dissection in the abdominal or pelvic vasculature.
Complete occlusion of the left common iliac, left internal iliac and
left external iliac arteries, with distal reconstitution of flow
from collateral pathways, similar to prior examinations. Retroaortic
left renal vein (normal anatomical variant) incidentally noted. No
lymphadenopathy noted in the abdomen or pelvis.

Reproductive: Prostate gland and seminal vesicles are unremarkable
in appearance.

Other: No significant volume of ascites.  No pneumoperitoneum.

Musculoskeletal: There are no aggressive appearing lytic or blastic
lesions noted in the visualized portions of the skeleton.
IMPRESSION: 1. Stable treatment related changes of postradiation mass-like
fibrosis in the medial aspect of the right lung base, with no
definitive findings to suggest locally recurrent disease or new
metastatic disease in the chest, abdomen or pelvis.
2. Small amount of pericardial fluid and/or thickening, similar to
the prior study.
3. Aortic atherosclerosis, in addition to left main and 2 vessel
coronary artery disease. Chronic occlusion of the left common iliac,
internal iliac and external iliac arteries with distal
reconstitution of flow from collateral pathways, similar to prior
studies.
4. Cholelithiasis without evidence of acute cholecystitis at this
time.
5. Additional incidental findings, as above.

## 2021-03-05 MED ORDER — IOHEXOL 350 MG/ML SOLN
80.0000 mL | Freq: Once | INTRAVENOUS | Status: AC | PRN
  Administered 2021-03-05: 80 mL via INTRAVENOUS

## 2021-03-05 MED ORDER — HEPARIN SOD (PORK) LOCK FLUSH 100 UNIT/ML IV SOLN
INTRAVENOUS | Status: AC
  Filled 2021-03-05: qty 5

## 2021-03-05 MED ORDER — HEPARIN SOD (PORK) LOCK FLUSH 100 UNIT/ML IV SOLN
500.0000 [IU] | Freq: Once | INTRAVENOUS | Status: AC
  Administered 2021-03-05: 500 [IU] via INTRAVENOUS

## 2021-03-08 ENCOUNTER — Other Ambulatory Visit: Payer: Self-pay | Admitting: Medical Oncology

## 2021-03-08 DIAGNOSIS — Z5112 Encounter for antineoplastic immunotherapy: Secondary | ICD-10-CM

## 2021-03-08 DIAGNOSIS — C3490 Malignant neoplasm of unspecified part of unspecified bronchus or lung: Secondary | ICD-10-CM

## 2021-03-09 ENCOUNTER — Inpatient Hospital Stay: Payer: Managed Care, Other (non HMO) | Attending: Internal Medicine

## 2021-03-09 ENCOUNTER — Encounter: Payer: Self-pay | Admitting: Internal Medicine

## 2021-03-09 ENCOUNTER — Encounter: Payer: Self-pay | Admitting: *Deleted

## 2021-03-09 ENCOUNTER — Inpatient Hospital Stay: Payer: Managed Care, Other (non HMO)

## 2021-03-09 ENCOUNTER — Inpatient Hospital Stay (HOSPITAL_BASED_OUTPATIENT_CLINIC_OR_DEPARTMENT_OTHER): Payer: Managed Care, Other (non HMO) | Admitting: Internal Medicine

## 2021-03-09 ENCOUNTER — Other Ambulatory Visit: Payer: Self-pay

## 2021-03-09 VITALS — BP 147/85 | HR 92 | Temp 97.8°F | Resp 18 | Ht 68.0 in | Wt 162.1 lb

## 2021-03-09 DIAGNOSIS — I2510 Atherosclerotic heart disease of native coronary artery without angina pectoris: Secondary | ICD-10-CM | POA: Diagnosis not present

## 2021-03-09 DIAGNOSIS — E785 Hyperlipidemia, unspecified: Secondary | ICD-10-CM | POA: Insufficient documentation

## 2021-03-09 DIAGNOSIS — C7931 Secondary malignant neoplasm of brain: Secondary | ICD-10-CM | POA: Insufficient documentation

## 2021-03-09 DIAGNOSIS — C3431 Malignant neoplasm of lower lobe, right bronchus or lung: Secondary | ICD-10-CM | POA: Insufficient documentation

## 2021-03-09 DIAGNOSIS — Z79899 Other long term (current) drug therapy: Secondary | ICD-10-CM | POA: Insufficient documentation

## 2021-03-09 DIAGNOSIS — C3491 Malignant neoplasm of unspecified part of right bronchus or lung: Secondary | ICD-10-CM | POA: Diagnosis not present

## 2021-03-09 DIAGNOSIS — F1010 Alcohol abuse, uncomplicated: Secondary | ICD-10-CM | POA: Insufficient documentation

## 2021-03-09 DIAGNOSIS — I700 Atherosclerosis of aorta: Secondary | ICD-10-CM | POA: Insufficient documentation

## 2021-03-09 DIAGNOSIS — C3490 Malignant neoplasm of unspecified part of unspecified bronchus or lung: Secondary | ICD-10-CM

## 2021-03-09 DIAGNOSIS — Z5112 Encounter for antineoplastic immunotherapy: Secondary | ICD-10-CM | POA: Insufficient documentation

## 2021-03-09 DIAGNOSIS — R1310 Dysphagia, unspecified: Secondary | ICD-10-CM | POA: Diagnosis not present

## 2021-03-09 DIAGNOSIS — K219 Gastro-esophageal reflux disease without esophagitis: Secondary | ICD-10-CM | POA: Insufficient documentation

## 2021-03-09 DIAGNOSIS — E039 Hypothyroidism, unspecified: Secondary | ICD-10-CM | POA: Diagnosis not present

## 2021-03-09 DIAGNOSIS — R51 Headache with orthostatic component, not elsewhere classified: Secondary | ICD-10-CM | POA: Diagnosis not present

## 2021-03-09 DIAGNOSIS — Z95828 Presence of other vascular implants and grafts: Secondary | ICD-10-CM

## 2021-03-09 DIAGNOSIS — Z5111 Encounter for antineoplastic chemotherapy: Secondary | ICD-10-CM

## 2021-03-09 DIAGNOSIS — I10 Essential (primary) hypertension: Secondary | ICD-10-CM | POA: Diagnosis not present

## 2021-03-09 DIAGNOSIS — K8020 Calculus of gallbladder without cholecystitis without obstruction: Secondary | ICD-10-CM | POA: Diagnosis not present

## 2021-03-09 DIAGNOSIS — K589 Irritable bowel syndrome without diarrhea: Secondary | ICD-10-CM | POA: Insufficient documentation

## 2021-03-09 LAB — CMP (CANCER CENTER ONLY)
ALT: 12 U/L (ref 0–44)
AST: 17 U/L (ref 15–41)
Albumin: 3.7 g/dL (ref 3.5–5.0)
Alkaline Phosphatase: 77 U/L (ref 38–126)
Anion gap: 8 (ref 5–15)
BUN: 6 mg/dL (ref 6–20)
CO2: 24 mmol/L (ref 22–32)
Calcium: 9 mg/dL (ref 8.9–10.3)
Chloride: 104 mmol/L (ref 98–111)
Creatinine: 0.79 mg/dL (ref 0.61–1.24)
GFR, Estimated: >60 mL/min (ref >60)
Glucose, Bld: 120 mg/dL — ABNORMAL HIGH (ref 70–99)
Potassium: 3.8 mmol/L (ref 3.5–5.1)
Sodium: 136 mmol/L (ref 135–145)
Total Bilirubin: 0.7 mg/dL (ref 0.3–1.2)
Total Protein: 6.8 g/dL (ref 6.5–8.1)

## 2021-03-09 LAB — CBC WITH DIFFERENTIAL (CANCER CENTER ONLY)
Abs Immature Granulocytes: 0.03 10*3/uL (ref 0.00–0.07)
Basophils Absolute: 0.1 10*3/uL (ref 0.0–0.1)
Basophils Relative: 1 %
Eosinophils Absolute: 0.2 10*3/uL (ref 0.0–0.5)
Eosinophils Relative: 2 %
HCT: 36.9 % — ABNORMAL LOW (ref 39.0–52.0)
Hemoglobin: 12 g/dL — ABNORMAL LOW (ref 13.0–17.0)
Immature Granulocytes: 0 %
Lymphocytes Relative: 15 %
Lymphs Abs: 1.2 10*3/uL (ref 0.7–4.0)
MCH: 30 pg (ref 26.0–34.0)
MCHC: 32.5 g/dL (ref 30.0–36.0)
MCV: 92.3 fL (ref 80.0–100.0)
Monocytes Absolute: 0.8 10*3/uL (ref 0.1–1.0)
Monocytes Relative: 11 %
Neutro Abs: 5.6 10*3/uL (ref 1.7–7.7)
Neutrophils Relative %: 71 %
Platelet Count: 251 10*3/uL (ref 150–400)
RBC: 4 MIL/uL — ABNORMAL LOW (ref 4.22–5.81)
RDW: 12.1 % (ref 11.5–15.5)
WBC Count: 7.9 10*3/uL (ref 4.0–10.5)
nRBC: 0 % (ref 0.0–0.2)

## 2021-03-09 LAB — TSH: TSH: 1.839 u[IU]/mL (ref 0.320–4.118)

## 2021-03-09 MED ORDER — SODIUM CHLORIDE 0.9 % IV SOLN
200.0000 mg | Freq: Once | INTRAVENOUS | Status: AC
  Administered 2021-03-09: 200 mg via INTRAVENOUS
  Filled 2021-03-09: qty 8

## 2021-03-09 MED ORDER — SODIUM CHLORIDE 0.9% FLUSH
10.0000 mL | INTRAVENOUS | Status: DC | PRN
  Administered 2021-03-09: 10 mL

## 2021-03-09 MED ORDER — HEPARIN SOD (PORK) LOCK FLUSH 100 UNIT/ML IV SOLN
500.0000 [IU] | Freq: Once | INTRAVENOUS | Status: AC | PRN
  Administered 2021-03-09: 500 [IU]

## 2021-03-09 MED ORDER — SODIUM CHLORIDE 0.9 % IV SOLN: Freq: Once | INTRAVENOUS | Status: AC

## 2021-03-09 MED ORDER — SODIUM CHLORIDE 0.9% FLUSH
10.0000 mL | Freq: Once | INTRAVENOUS | Status: AC
  Administered 2021-03-09: 10 mL

## 2021-03-09 NOTE — Progress Notes (Signed)
[place] [phone]   [phone]  OFFICE PROGRESS NOTE  [name], NP [address] [name] [address] [place] [address]  DIAGNOSIS: Stage IV (T3, N0, M1C) non-small cell lung cancer, adenocarcinoma.  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 He was diagnosed in July 2021.   Molecular Biomarkers:  MSI-High DETECTED Pembrolizumab Atezolizumab, Avelumab, Cemiplimab, Dostarlimab, Durvalumab, Ipilimumab, Nivolumab   STK11Splice Site SNV 6.3% Everolimus, Temsirolimus Yes   [id number] 1.7% Binimetinib Yes   [id number] 0.4%   Niraparib, Olaparib, Rucaparib, Talazoparib, Tazemetostat Yes   PRIOR THERAPY:  1) SRS to the solitary brain metastasis under the care of Dr. Lisbeth Renshaw. Last treatment 11/14/19. 2) Weekly concurrent chemoradiation with carboplatin for an AUC of 2, paclitaxel 45 mg/m2.  First dose expected on 11/25/2019. Status post 7 cycles, last dose was giving 01/06/2020 with partial response.    CURRENT THERAPY:  1)  Immunotherapy with Keytruda 200 mg IV every 3 weeks.  First dose February 10, 2020 for a patient with MSI high.  Status post 17  cycles. 2) Avastin 15 mg/KG every 3 weeks.  First dose today for the vasogenic edema of the brain.S/P 7 cycles.  INTERVAL HISTORY: Joseph Atkins 60 y.o. male returns to the clinic today for follow-up visit accompanied by his wife.  The patient is feeling fine today with no concerning complaints except for occasional headache.  He is scheduled to have repeat MRI of the brain on March 26, 2021.  The patient denied having any current chest pain, shortness of breath, cough or hemoptysis.  He denied having any fever or chills.  He has no nausea, vomiting, diarrhea or constipation.  He denied having any significant weight loss or night sweats.  He continues to tolerate his treatment with single agent Keytruda  fairly well.  The patient had repeat CT scan of the chest, abdomen pelvis performed recently and he is here for evaluation and discussion of his scan results.  MEDICAL HISTORY: Past Medical History:  Diagnosis Date   Cancer ([place])    lung cancer   Diverticulosis    GERD (gastroesophageal reflux disease)    Hx of small bowel obstruction    Hyperlipidemia    Hypertension    Hypothyroidism    IBS (irritable bowel syndrome)    Substance abuse ([place])    Alcoholic, Drug addition   Thyroid disease     ALLERGIES:  is allergic to penicillins.  MEDICATIONS:  Current Outpatient Medications  Medication Sig Dispense Refill   albuterol (PROVENTIL HFA;VENTOLIN HFA) 108 (90 Base) MCG/ACT inhaler Inhale 2 puffs into the lungs every 6 (six) hours as needed for wheezing or shortness of breath. 1 Inhaler 0   ANORO ELLIPTA 62.5-25 MCG/INH AEPB 1 puff daily.     cyclobenzaprine (FLEXERIL) 10 MG tablet Take 10 mg by mouth 2 (two) times daily as needed for muscle spasms.     docusate sodium (COLACE) 100 MG capsule Take 100 mg by mouth daily.     DULoxetine (CYMBALTA) 20 MG capsule Take 1 capsule (20 mg total) by mouth 2 (two) times daily. 60 capsule 5   fluticasone furoate-vilanterol (BREO ELLIPTA) 100-25 MCG/INH AEPB Inhale 1 puff into the lungs daily. 30 each 3   levothyroxine (SYNTHROID) 125 MCG tablet Take 1 tablet (125 mcg total) by mouth daily. 30 tablet 1   lidocaine-prilocaine (EMLA) cream Apply 1 application topically as  needed. 30 g 0   omeprazole (PRILOSEC) 40 MG capsule Take 1 capsule (40 mg total) by mouth daily. 90 capsule 4   omeprazole (PRILOSEC) 40 MG capsule 1 capsule     ondansetron (ZOFRAN) 4 MG tablet Take 1 tablet (4 mg total) by mouth every 8 (eight) hours as needed. (Patient not taking: No sig reported) 40 tablet 2   oxyCODONE-acetaminophen (PERCOCET/ROXICET) 5-325 MG tablet Take 1 tablet by mouth every 4 (four) hours as needed for severe pain. 30 tablet 0   pembrolizumab (KEYTRUDA)  100 MG/4ML SOLN See admin instructions.     polyethylene glycol powder (MIRALAX) powder Take 17 g by mouth daily. 255 g 11   prochlorperazine (COMPAZINE) 10 MG tablet Take 1 tablet (10 mg total) by mouth every 6 (six) hours as needed. (Patient not taking: No sig reported) 30 tablet 2   TRULANCE 3 MG TABS Take 1 tablet by mouth daily.     zolpidem (AMBIEN CR) 12.5 MG CR tablet Take 1 tablet (12.5 mg total) by mouth at bedtime as needed for sleep. 30 tablet 5   No current facility-administered medications for this visit.    SURGICAL HISTORY:  Past Surgical History:  Procedure Laterality Date   arm surgery Right    BRONCHIAL BRUSHINGS  10/24/2019   Procedure: BRONCHIAL BRUSHINGS;  Surgeon: Collene Gobble, MD;  Location: [facility] ENDOSCOPY;  Service: Cardiopulmonary;;  right lower lobe    BRONCHIAL BRUSHINGS  11/05/2019   Procedure: BRONCHIAL BRUSHINGS;  Surgeon: Collene Gobble, MD;  Location: [facility] ENDOSCOPY;  Service: Pulmonary;;   BRONCHIAL NEEDLE ASPIRATION BIOPSY  10/24/2019   Procedure: BRONCHIAL NEEDLE ASPIRATION BIOPSIES;  Surgeon: Collene Gobble, MD;  Location: [facility];  Service: Cardiopulmonary;;   BRONCHIAL NEEDLE ASPIRATION BIOPSY  11/05/2019   Procedure: BRONCHIAL NEEDLE ASPIRATION BIOPSIES;  Surgeon: Collene Gobble, MD;  Location: [facility] ENDOSCOPY;  Service: Pulmonary;;   ENDOBRONCHIAL ULTRASOUND N/A 10/24/2019   Procedure: ENDOBRONCHIAL ULTRASOUND;  Surgeon: Collene Gobble, MD;  Location: [facility];  Service: Cardiopulmonary;  Laterality: N/A;   FINGER SURGERY Right    Middle   IR IMAGING GUIDED PORT INSERTION  11/19/2019   VIDEO BRONCHOSCOPY N/A 10/24/2019   Procedure: VIDEO BRONCHOSCOPY WITHOUT FLUORO;  Surgeon: Collene Gobble, MD;  Location: [facility];  Service: Cardiopulmonary;  Laterality: N/A;   VIDEO BRONCHOSCOPY WITH ENDOBRONCHIAL NAVIGATION N/A 11/05/2019   Procedure: VIDEO BRONCHOSCOPY WITH ENDOBRONCHIAL NAVIGATION;  Surgeon: Collene Gobble, MD;  Location: [facility] ENDOSCOPY;   Service: Pulmonary;  Laterality: N/A;    REVIEW OF SYSTEMS:  Constitutional: positive for fatigue Eyes: negative Ears, nose, mouth, throat, and face: negative Respiratory: negative Cardiovascular: negative Gastrointestinal: negative Genitourinary:negative Integument/breast: negative Hematologic/lymphatic: negative Musculoskeletal:negative Neurological: positive for headaches Behavioral/Psych: negative Endocrine: negative Allergic/Immunologic: negative   PHYSICAL EXAMINATION: General appearance: alert, cooperative, fatigued, and no distress Head: Normocephalic, without obvious abnormality, atraumatic Neck: no adenopathy, no JVD, supple, symmetrical, trachea midline, and thyroid not enlarged, symmetric, no tenderness/mass/nodules Lymph nodes: Cervical, supraclavicular, and axillary nodes normal. Resp: clear to auscultation bilaterally Back: symmetric, no curvature. ROM normal. No CVA tenderness. Cardio: regular rate and rhythm, S1, S2 normal, no murmur, click, rub or gallop GI: soft, non-tender; bowel sounds normal; no masses,  no organomegaly Extremities: extremities normal, atraumatic, no cyanosis or edema Neurologic: Alert and oriented X 3, normal strength and tone. Normal symmetric reflexes. Normal coordination and gait  ECOG PERFORMANCE STATUS: 1 - Symptomatic but completely ambulatory  Blood pressure (!) 147/85, pulse 92, temperature 97.8 F (  36.6 C), temperature source Temporal, resp. rate 18, height _0  (1.727 m), weight 162 lb 1.6 oz (73.5 kg), SpO2 99 %.  LABORATORY DATA: Lab Results  Component Value Date   WBC 8.2 02/16/2021   HGB 11.9 (L) 02/16/2021   HCT 36.1 (L) 02/16/2021   MCV 93.5 02/16/2021   PLT 235 02/16/2021      Chemistry      Component Value Date/Time   NA 137 02/16/2021 1056   NA 141 03/08/2017 1707   K 3.5 02/16/2021 1056   CL 104 02/16/2021 1056   CO2 24 02/16/2021 1056   BUN 10 02/16/2021 1056   BUN 12 03/08/2017 1707   CREATININE 0.76  02/16/2021 1056      Component Value Date/Time   CALCIUM 9.1 02/16/2021 1056   ALKPHOS 67 02/16/2021 1056   AST 19 02/16/2021 1056   ALT 16 02/16/2021 1056   BILITOT 0.7 02/16/2021 1056       RADIOGRAPHIC STUDIES: CT Chest W Contrast  Result Date: 03/07/2021 CLINICAL DATA:  60 year old male with history of non-small cell lung cancer status post chemotherapy and radiation therapy now complete. Ongoing immunotherapy. EXAM: CT CHEST, ABDOMEN, AND PELVIS WITH CONTRAST TECHNIQUE: Multidetector CT imaging of the chest, abdomen and pelvis was performed following the standard protocol during bolus administration of intravenous contrast. CONTRAST:  59m OMNIPAQUE IOHEXOL 350 MG/ML SOLN COMPARISON:  CT of the chest, abdomen and pelvis 12/11/2020. FINDINGS: CT CHEST FINDINGS Cardiovascular: Heart size is normal. Small to moderate volume of pericardial fluid and thickening, similar to the prior study. No pericardial calcification. There is aortic atherosclerosis, as well as atherosclerosis of the great vessels of the mediastinum and the coronary arteries, including calcified atherosclerotic plaque in the left main, left anterior descending and right coronary arteries. Right internal jugular single-lumen porta cath with tip terminating in the right atrium. Mediastinum/Nodes: No pathologically enlarged mediastinal or hilar lymph nodes. Esophagus is unremarkable in appearance. No axillary lymphadenopathy. Lungs/Pleura: Treated right lower lobe mass appears grossly similar in size to the prior examination, estimated to measure approximately 3.9 x 2.9 cm on today's study (axial image 45 of series 2). Surrounding areas with mild traction bronchiectasis, extensive thickening of the peribronchovascular interstitium, ground-glass attenuation and surrounding areas of septal thickening noted in the central portion of the right middle lobe and right lower lobe, compatible with chronic postradiation mass-like fibrosis,  similar to the prior examination. No other suspicious appearing pulmonary nodules or masses are noted. No acute consolidative airspace disease. Trace amount of right-sided pleural fluid and thickening, stable compared to the prior study. No left-sided pleural fluid. Musculoskeletal: There are no aggressive appearing lytic or blastic lesions noted in the visualized portions of the skeleton. CT ABDOMEN PELVIS FINDINGS Hepatobiliary: Small ovoid shaped low-attenuation lesion in the central aspect of the left lobe of the liver, stable in size and appearance compared to prior studies, too small to definitively characterize, but favored to represent a small cyst. No other suspicious appearing hepatic lesions. No intra or extrahepatic biliary ductal dilatation. Partially calcified gallstone in the gallbladder. No findings to suggest an acute cholecystitis at this time. Pancreas: No pancreatic mass. No pancreatic ductal dilatation. No pancreatic or peripancreatic fluid collections or inflammatory changes. Spleen: Unremarkable. Adrenals/Urinary Tract: Bilateral kidneys and adrenal glands are normal in appearance. No hydroureteronephrosis. Urinary bladder is normal in appearance. Stomach/Bowel: The appearance of the stomach is normal. No pathologic dilatation of small bowel or colon. Normal appendix. Vascular/Lymphatic: Aortic atherosclerosis, without evidence of aneurysm or  dissection in the abdominal or pelvic vasculature. Complete occlusion of the left common iliac, left internal iliac and left external iliac arteries, with distal reconstitution of flow from collateral pathways, similar to prior examinations. Retroaortic left renal vein (normal anatomical variant) incidentally noted. No lymphadenopathy noted in the abdomen or pelvis. Reproductive: Prostate gland and seminal vesicles are unremarkable in appearance. Other: No significant volume of ascites.  No pneumoperitoneum. Musculoskeletal: There are no aggressive  appearing lytic or blastic lesions noted in the visualized portions of the skeleton. IMPRESSION: 1. Stable treatment related changes of postradiation mass-like fibrosis in the medial aspect of the right lung base, with no definitive findings to suggest locally recurrent disease or new metastatic disease in the chest, abdomen or pelvis. 2. Small amount of pericardial fluid and/or thickening, similar to the prior study. 3. Aortic atherosclerosis, in addition to left main and 2 vessel coronary artery disease. Chronic occlusion of the left common iliac, internal iliac and external iliac arteries with distal reconstitution of flow from collateral pathways, similar to prior studies. 4. Cholelithiasis without evidence of acute cholecystitis at this time. 5. Additional incidental findings, as above. Electronically Signed   By: Vinnie Langton M.D.   On: 03/07/2021 12:35   CT Abdomen Pelvis W Contrast  Result Date: 03/07/2021 CLINICAL DATA:  60 year old male with history of non-small cell lung cancer status post chemotherapy and radiation therapy now complete. Ongoing immunotherapy. EXAM: CT CHEST, ABDOMEN, AND PELVIS WITH CONTRAST TECHNIQUE: Multidetector CT imaging of the chest, abdomen and pelvis was performed following the standard protocol during bolus administration of intravenous contrast. CONTRAST:  53m OMNIPAQUE IOHEXOL 350 MG/ML SOLN COMPARISON:  CT of the chest, abdomen and pelvis 12/11/2020. FINDINGS: CT CHEST FINDINGS Cardiovascular: Heart size is normal. Small to moderate volume of pericardial fluid and thickening, similar to the prior study. No pericardial calcification. There is aortic atherosclerosis, as well as atherosclerosis of the great vessels of the mediastinum and the coronary arteries, including calcified atherosclerotic plaque in the left main, left anterior descending and right coronary arteries. Right internal jugular single-lumen porta cath with tip terminating in the right atrium.  Mediastinum/Nodes: No pathologically enlarged mediastinal or hilar lymph nodes. Esophagus is unremarkable in appearance. No axillary lymphadenopathy. Lungs/Pleura: Treated right lower lobe mass appears grossly similar in size to the prior examination, estimated to measure approximately 3.9 x 2.9 cm on today's study (axial image 45 of series 2). Surrounding areas with mild traction bronchiectasis, extensive thickening of the peribronchovascular interstitium, ground-glass attenuation and surrounding areas of septal thickening noted in the central portion of the right middle lobe and right lower lobe, compatible with chronic postradiation mass-like fibrosis, similar to the prior examination. No other suspicious appearing pulmonary nodules or masses are noted. No acute consolidative airspace disease. Trace amount of right-sided pleural fluid and thickening, stable compared to the prior study. No left-sided pleural fluid. Musculoskeletal: There are no aggressive appearing lytic or blastic lesions noted in the visualized portions of the skeleton. CT ABDOMEN PELVIS FINDINGS Hepatobiliary: Small ovoid shaped low-attenuation lesion in the central aspect of the left lobe of the liver, stable in size and appearance compared to prior studies, too small to definitively characterize, but favored to represent a small cyst. No other suspicious appearing hepatic lesions. No intra or extrahepatic biliary ductal dilatation. Partially calcified gallstone in the gallbladder. No findings to suggest an acute cholecystitis at this time. Pancreas: No pancreatic mass. No pancreatic ductal dilatation. No pancreatic or peripancreatic fluid collections or inflammatory changes. Spleen: Unremarkable. Adrenals/Urinary  Tract: Bilateral kidneys and adrenal glands are normal in appearance. No hydroureteronephrosis. Urinary bladder is normal in appearance. Stomach/Bowel: The appearance of the stomach is normal. No pathologic dilatation of small bowel  or colon. Normal appendix. Vascular/Lymphatic: Aortic atherosclerosis, without evidence of aneurysm or dissection in the abdominal or pelvic vasculature. Complete occlusion of the left common iliac, left internal iliac and left external iliac arteries, with distal reconstitution of flow from collateral pathways, similar to prior examinations. Retroaortic left renal vein (normal anatomical variant) incidentally noted. No lymphadenopathy noted in the abdomen or pelvis. Reproductive: Prostate gland and seminal vesicles are unremarkable in appearance. Other: No significant volume of ascites.  No pneumoperitoneum. Musculoskeletal: There are no aggressive appearing lytic or blastic lesions noted in the visualized portions of the skeleton. IMPRESSION: 1. Stable treatment related changes of postradiation mass-like fibrosis in the medial aspect of the right lung base, with no definitive findings to suggest locally recurrent disease or new metastatic disease in the chest, abdomen or pelvis. 2. Small amount of pericardial fluid and/or thickening, similar to the prior study. 3. Aortic atherosclerosis, in addition to left main and 2 vessel coronary artery disease. Chronic occlusion of the left common iliac, internal iliac and external iliac arteries with distal reconstitution of flow from collateral pathways, similar to prior studies. 4. Cholelithiasis without evidence of acute cholecystitis at this time. 5. Additional incidental findings, as above. Electronically Signed   By: Vinnie Langton M.D.   On: 03/07/2021 12:35     ASSESSMENT AND PLAN: This is a very pleasant 60 years old white male with a stage IV (t3, N0, M1c) non-small cell lung cancer, adenocarcinoma with MSI high presented with right lower lobe/infrahilar mass in addition to solitary brain metastasis in the left cerebellum diagnosed in July 2021. He is status post SRS to the solitary brain metastasis. The patient completed a course of concurrent  chemoradiation with weekly carboplatin and paclitaxel.  He tolerated the treatment well except for fatigue and mild odynophagia. The patient has MSI high and I recommended for him treatment with immunotherapy with single agent Keytruda 200 mg IV every 3 weeks for a total of 2 years unless the patient has unacceptable toxicity or disease progression. He is status post 17 cycles of treatment with Keytruda.  He also received 7 cycles of Avastin for the vasogenic edema in the brain.   Avastin will be on hold for now unless needed in the future. The patient has been tolerating this treatment fairly well with no concerning adverse effects. He had repeat CT scan of the chest, abdomen and pelvis performed recently.  I personally and independently reviewed the scans and discussed the results with the patient and his wife. Has a scan showed stable treatment-related changes of post radiation masslike fibrosis in the medial aspect of the right lung base with no definite evidence for disease recurrence or new metastatic disease. I recommended for him to continue his current treatment with [facility] and he will proceed with cycle #18 today. Regarding the intermittent headache, he is scheduled to have repeat MRI of the brain in around 2 weeks with follow-up with neuro-oncology. He will come back for follow-up visit in 3 weeks for evaluation before the next cycle of his treatment. The patient was advised to call immediately if he has any concerning symptoms in the interval. The patient voices understanding of current disease status and treatment options and is in agreement with the current care plan.  All questions were answered. The patient knows to  call the clinic with any problems, questions or concerns. We can certainly see the patient much sooner if necessary.  Disclaimer: This note was dictated with voice recognition software. Similar sounding words can inadvertently be transcribed and may not be corrected upon  review.

## 2021-03-09 NOTE — Patient Instructions (Signed)
Steps to Quit Smoking Smoking tobacco is the leading cause of preventable death. It can affect almost every organ in the body. Smoking puts you and people around you at risk for many serious, long-lasting (chronic) diseases. Quitting smoking can be hard, but it is one of the best things that you can do for your health. It is never too late to quit. How do I get ready to quit? When you decide to quit smoking, make a plan to help you succeed. Before you quit: Pick a date to quit. Set a date within the next 2 weeks to give you time to prepare. Write down the reasons why you are quitting. Keep this list in places where you will see it often. Tell your family, friends, and co-workers that you are quitting. Their support is important. Talk with your doctor about the choices that may help you quit. Find out if your health insurance will pay for these treatments. Know the people, places, things, and activities that make you want to smoke (triggers). Avoid them. What first steps can I take to quit smoking? Throw away all cigarettes at home, at work, and in your car. Throw away the things that you use when you smoke, such as ashtrays and lighters. Clean your car. Make sure to empty the ashtray. Clean your home, including curtains and carpets. What can I do to help me quit smoking? Talk with your doctor about taking medicines and seeing a counselor at the same time. You are more likely to succeed when you do both. If you are pregnant or breastfeeding, talk with your doctor about counseling or other ways to quit smoking. Do not take medicine to help you quit smoking unless your doctor tells you to do so. To quit smoking: Quit right away Quit smoking totally, instead of slowly cutting back on how much you smoke over a period of time. Go to counseling. You are more likely to quit if you go to counseling sessions regularly. Take medicine You may take medicines to help you quit. Some medicines need a  prescription, and some you can buy over-the-counter. Some medicines may contain a drug called nicotine to replace the nicotine in cigarettes. Medicines may: Help you to stop having the desire to smoke (cravings). Help to stop the problems that come when you stop smoking (withdrawal symptoms). Your doctor may ask you to use: Nicotine patches, gum, or lozenges. Nicotine inhalers or sprays. Non-nicotine medicine that is taken by mouth. Find resources Find resources and other ways to help you quit smoking and remain smoke-free after you quit. These resources are most helpful when you use them often. They include: Online chats with a counselor. Phone quitlines. Printed self-help materials. Support groups or group counseling. Text messaging programs. Mobile phone apps. Use apps on your mobile phone or tablet that can help you stick to your quit plan. There are many free apps for mobile phones and tablets as well as websites. Examples include Quit Guide from the CDC and smokefree.gov  What things can I do to make it easier to quit?  Talk to your family and friends. Ask them to support and encourage you. Call a phone quitline (1-800-QUIT-NOW), reach out to support groups, or work with a counselor. Ask people who smoke to not smoke around you. Avoid places that make you want to smoke, such as: Bars. Parties. Smoke-break areas at work. Spend time with people who do not smoke. Lower the stress in your life. Stress can make you want to   smoke. Try these things to help your stress: Getting regular exercise. Doing deep-breathing exercises. Doing yoga. Meditating. Doing a body scan. To do this, close your eyes, focus on one area of your body at a time from head to toe. Notice which parts of your body are tense. Try to relax the muscles in those areas. How will I feel when I quit smoking? Day 1 to 3 weeks Within the first 24 hours, you may start to have some problems that come from quitting tobacco.  These problems are very bad 2-3 days after you quit, but they do not often last for more than 2-3 weeks. You may get these symptoms: Mood swings. Feeling restless, nervous, angry, or annoyed. Trouble concentrating. Dizziness. Strong desire for high-sugar foods and nicotine. Weight gain. Trouble pooping (constipation). Feeling like you may vomit (nausea). Coughing or a sore throat. Changes in how the medicines that you take for other issues work in your body. Depression. Trouble sleeping (insomnia). Week 3 and afterward After the first 2-3 weeks of quitting, you may start to notice more positive results, such as: Better sense of smell and taste. Less coughing and sore throat. Slower heart rate. Lower blood pressure. Clearer skin. Better breathing. Fewer sick days. Quitting smoking can be hard. Do not give up if you fail the first time. Some people need to try a few times before they succeed. Do your best to stick to your quit plan, and talk with your doctor if you have any questions or concerns. Summary Smoking tobacco is the leading cause of preventable death. Quitting smoking can be hard, but it is one of the best things that you can do for your health. When you decide to quit smoking, make a plan to help you succeed. Quit smoking right away, not slowly over a period of time. When you start quitting, seek help from your doctor, family, or friends. This information is not intended to replace advice given to you by your health care provider. Make sure you discuss any questions you have with your health care provider. Document Revised: 12/18/2020 Document Reviewed: 06/30/2018 Elsevier Patient Education  2022 [employer]  

## 2021-03-09 NOTE — Progress Notes (Signed)
I spoke to patient and his wife today at clinic.  He is having occasional headaches and Dr. Julien Nordmann address this with him.  No barriers noted at this time.

## 2021-03-09 NOTE — Patient Instructions (Signed)
[facility]  Discharge Instructions: ?Thank you for choosing [facility] to provide your oncology and hematology care.  ? ?If you have a lab appointment with the Cancer Center, please go directly to the Cancer Center and check in at the registration area. ?  ?Wear comfortable clothing and clothing appropriate for easy access to any Portacath or PICC line.  ? ?We strive to give you quality time with your provider. You may need to reschedule your appointment if you arrive late (15 or more minutes).  Arriving late affects you and other patients whose appointments are after yours.  Also, if you miss three or more appointments without notifying the office, you may be dismissed from the clinic at the provider?s discretion.    ?  ?For prescription refill requests, have your pharmacy contact our office and allow 72 hours for refills to be completed.   ? ?Today you received the following chemotherapy and/or immunotherapy agents: Keytruda ?  ?To help prevent nausea and vomiting after your treatment, we encourage you to take your nausea medication as directed. ? ?BELOW ARE SYMPTOMS THAT SHOULD BE REPORTED IMMEDIATELY: ?*FEVER GREATER THAN 100.4 F (38 ?C) OR HIGHER ?*CHILLS OR SWEATING ?*NAUSEA AND VOMITING THAT IS NOT CONTROLLED WITH YOUR NAUSEA MEDICATION ?*UNUSUAL SHORTNESS OF BREATH ?*UNUSUAL BRUISING OR BLEEDING ?*URINARY PROBLEMS (pain or burning when urinating, or frequent urination) ?*BOWEL PROBLEMS (unusual diarrhea, constipation, pain near the anus) ?TENDERNESS IN MOUTH AND THROAT WITH OR WITHOUT PRESENCE OF ULCERS (sore throat, sores in mouth, or a toothache) ?UNUSUAL RASH, SWELLING OR PAIN  ?UNUSUAL VAGINAL DISCHARGE OR ITCHING  ? ?Items with * indicate a potential emergency and should be followed up as soon as possible or go to the Emergency Department if any problems should occur. ? ?Please show the CHEMOTHERAPY ALERT CARD or IMMUNOTHERAPY ALERT CARD at check-in to the  Emergency Department and triage nurse. ? ?Should you have questions after your visit or need to cancel or reschedule your appointment, please contact [facility]  Dept: [phone]  and follow the prompts.  Office hours are 8:00 a.m. to 4:30 p.m. Monday - Friday. Please note that voicemails left after 4:00 p.m. may not be returned until the following business day.  We are closed weekends and major holidays. You have access to a nurse at all times for urgent questions. Please call the main number to the clinic Dept: [phone] and follow the prompts. ? ? ?For any non-urgent questions, you may also contact your provider using MyChart. We now offer e-Visits for anyone 18 and older to request care online for non-urgent symptoms. For details visit [email or web address]. ?  ?Also download the MyChart app! Go to the app store, search "MyChart", open the app, select , and log in with your MyChart username and password. ? ?Due to Covid, a mask is required upon entering the hospital/clinic. If you do not have a mask, one will be given to you upon arrival. For doctor visits, patients may have 1 support person aged 18 or older with them. For treatment visits, patients cannot have anyone with them due to current Covid guidelines and our immunocompromised population.  ? ?

## 2021-03-26 ENCOUNTER — Encounter: Payer: Self-pay | Admitting: Radiation Oncology

## 2021-03-26 ENCOUNTER — Ambulatory Visit
Admission: RE | Admit: 2021-03-26 | Discharge: 2021-03-26 | Disposition: A | Discharge status: Home / Self Care | Payer: Managed Care, Other (non HMO) | Source: Ambulatory Visit | Attending: Radiation Oncology | Admitting: Radiation Oncology

## 2021-03-26 ENCOUNTER — Encounter: Payer: Self-pay | Admitting: Internal Medicine

## 2021-03-26 ENCOUNTER — Other Ambulatory Visit: Payer: Self-pay

## 2021-03-26 ENCOUNTER — Encounter: Payer: Self-pay | Admitting: Physician Assistant

## 2021-03-26 DIAGNOSIS — C7931 Secondary malignant neoplasm of brain: Secondary | ICD-10-CM

## 2021-03-26 DIAGNOSIS — C7949 Secondary malignant neoplasm of other parts of nervous system: Secondary | ICD-10-CM

## 2021-03-26 IMAGING — MR MR HEAD WO/W CM
12 series · 48 of 48 positions shown · IV contrast (MULTIHANCE)
Comparison: [DATE]

CLINICAL DATA: History of lung and brain cancer. Surveillance
imaging

EXAM:
MRI HEAD WITHOUT AND WITH CONTRAST
TECHNIQUE: Multiplanar, multiecho pulse sequences of the brain and surrounding
structures were obtained without and with intravenous contrast.
CONTRAST:  15mL MULTIHANCE GADOBENATE DIMEGLUMINE 529 MG/ML IV SOLN

[Series 2: FLAIR · sagittal · 3.0mm · 0.75mm/px · 3 of 39 slices shown (1 of 2)]
[im 1/39]
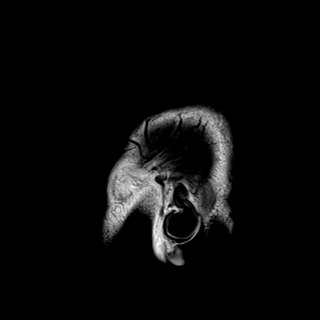
[im 20/39]
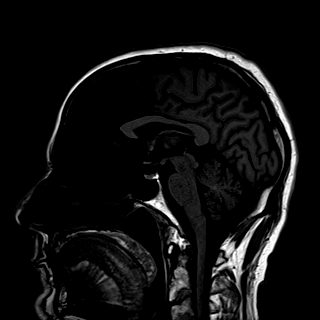
[im 39/39]
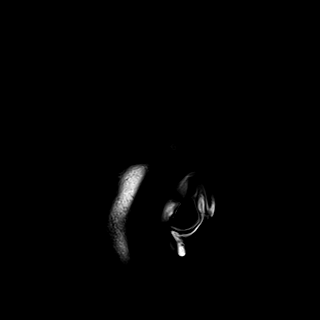

[Series 3: DWI · axial · 3.0mm · 1.50mm/px · z∈[-71,+81]mm · 4 of 80 slices shown (1 of 2)]
[im 1/80]
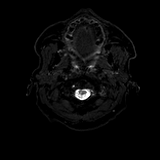
[im 27/80]
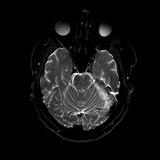
[im 53/80]
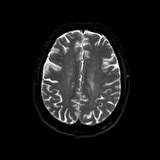
[im 80/80]
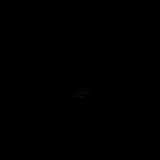

[Series 4: DWI · axial · 3.0mm · 1.50mm/px · z∈[-71,+81]mm · 2 of 40 slices shown (2 of 2)]
[im 1/40]
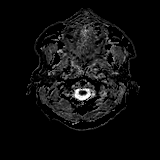
[im 40/40]
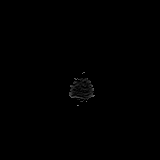

[Series 5: T2 · axial · 5.0mm · 0.57mm/px · 1 of 27 slices shown (1 of 2)]
[im 1/27]
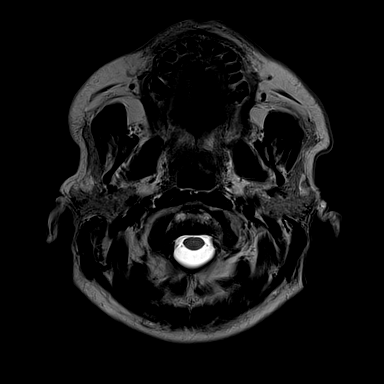

[Series 6: swi_images · axial · 1.5mm · 0.90mm/px · z∈[-68,+86]mm · 5 of 104 slices shown]
[im 1/104]
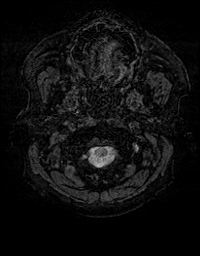
[im 26/104]
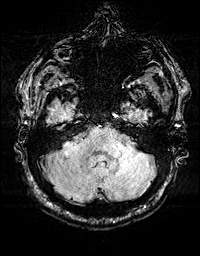
[im 52/104]
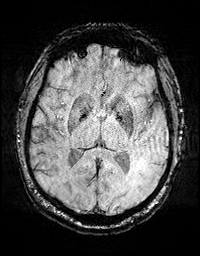
[im 78/104]
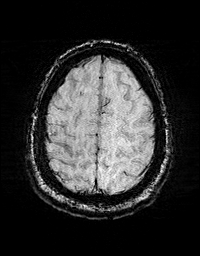
[im 104/104]
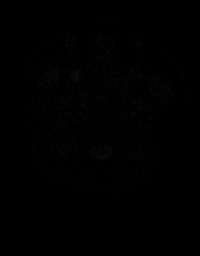

[Series 8: FLAIR · axial · 3.0mm · 0.86mm/px · z∈[-102,+90]mm · 3 of 64 slices shown (2 of 2)]
[im 1/64]
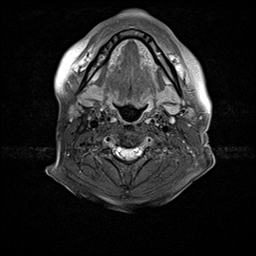
[im 32/64]
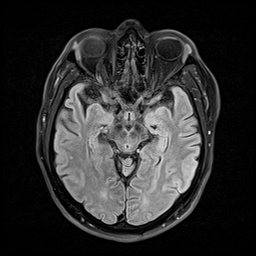
[im 64/64]
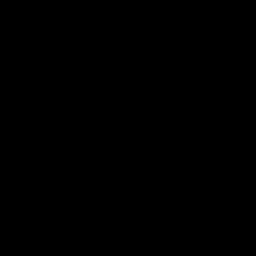

[Series 9: T2 · axial · non-contrast · 1.0mm · 0.86mm/px · z∈[-71,+85]mm · 8 of 160 slices shown (2 of 2)]
[im 1/160]
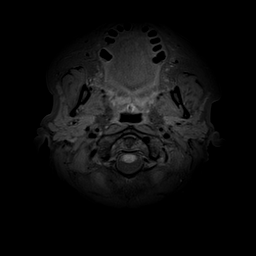
[im 23/160]
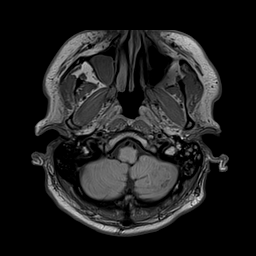
[im 46/160]
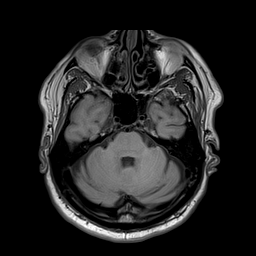
[im 69/160]
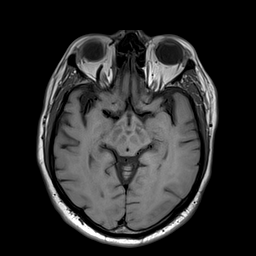
[im 91/160]
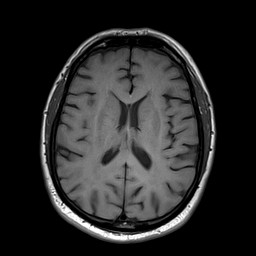
[im 114/160]
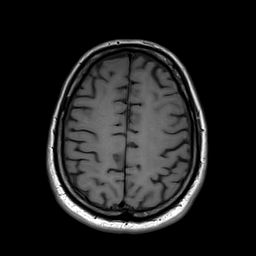
[im 137/160]
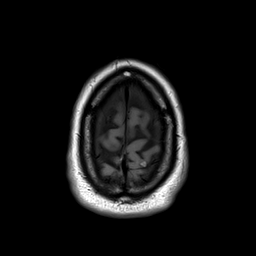
[im 160/160]
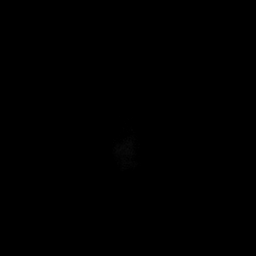

[Series 10: T2 post-contrast · coronal · 3.0mm · 0.57mm/px · 2 of 47 slices shown (1 of 2)]
[im 1/47]
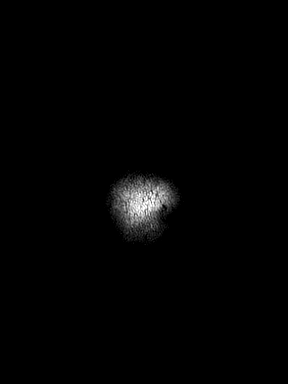
[im 47/47]
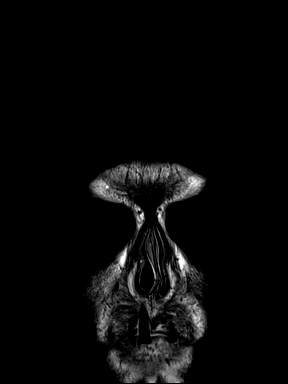

[Series 11: T2 post-contrast · axial · 1.0mm · 0.86mm/px · z∈[-71,+85]mm · 8 of 160 slices shown (2 of 2)]
[im 1/160]
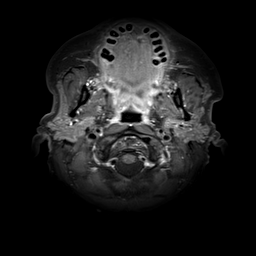
[im 23/160]
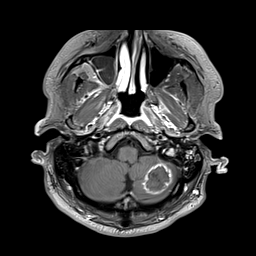
[im 46/160]
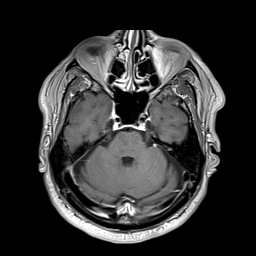
[im 69/160]
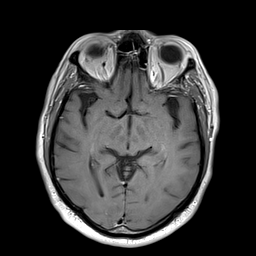
[im 91/160]
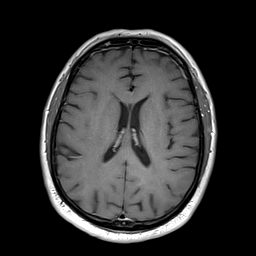
[im 114/160]
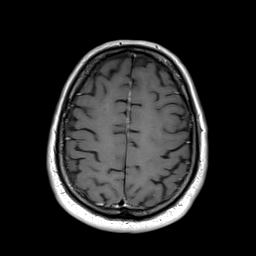
[im 137/160]
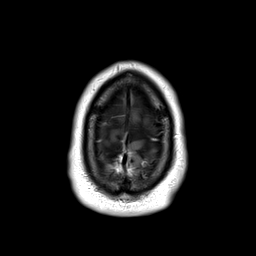
[im 160/160]
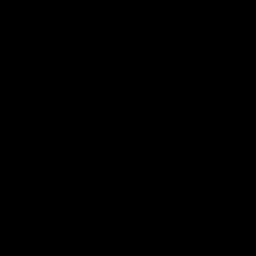

[Series 12: T1 post-contrast · axial · 1.0mm · 0.75mm/px · z∈[-73,+86]mm · 8 of 160 slices shown (1 of 2)]
[im 1/160]
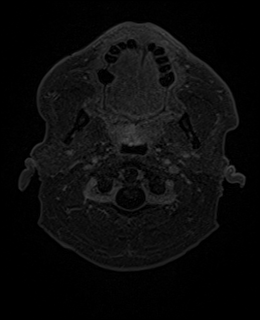
[im 23/160]
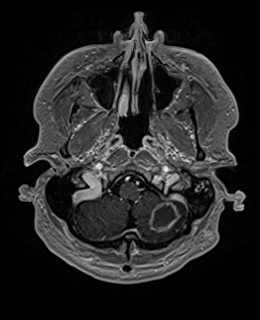
[im 46/160]
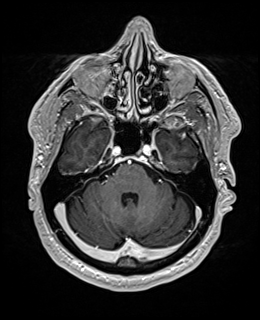
[im 69/160]
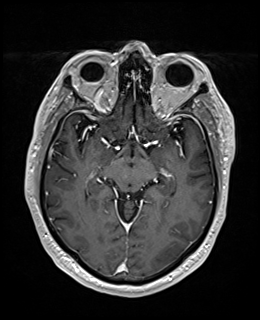
[im 91/160]
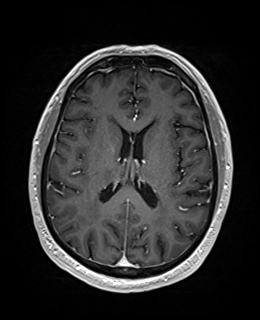
[im 114/160]
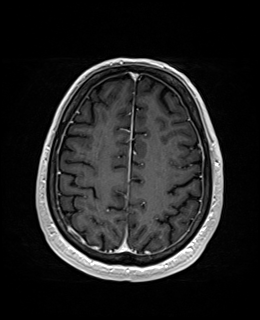
[im 137/160]
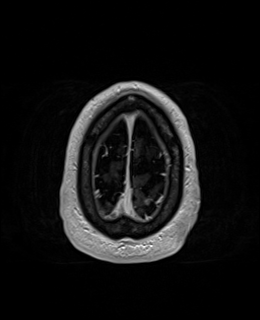
[im 160/160]
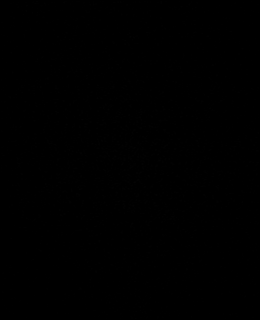

[Series 13: T1 post-contrast · coronal · 3.0mm · 0.57mm/px · 2 of 47 slices shown (2 of 2)]
[im 1/47]
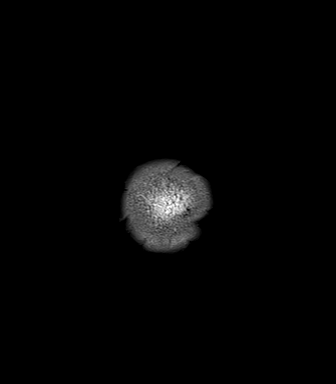
[im 47/47]
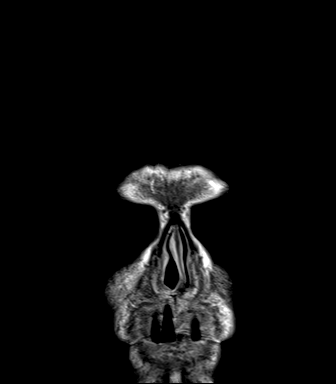

[Series 14: FLAIR post-contrast · sagittal · 3.0mm · 0.75mm/px · 2 of 39 slices shown]
[im 1/39]
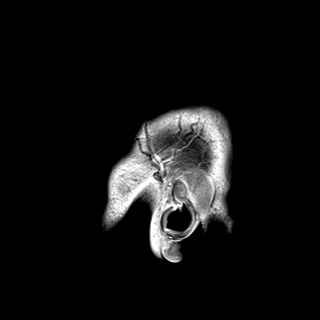
[im 39/39]
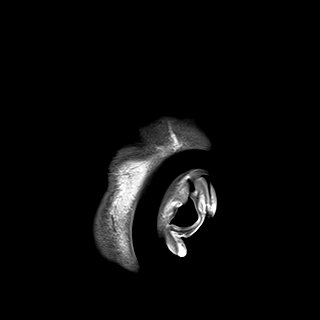

[48 of 48 positions shown; findings below may reference images not displayed]

FINDINGS: BRAIN

New Lesions:

Punctate nodular enhancement along the left parietal cortex on
[DATE]. This was not seen on prior or even more remote imaging.

Larger lesions: None.

Stable or Smaller lesions:

Partially mineralized, peripherally enhancing left cerebellar lesion
measuring 2.8 cm, unchanged in size although there is new mild
vasogenic edema.

Punctate left paramedian occipital lobe nodule on [DATE] is unchanged

6 mm nodule at the high left frontal parietal junction on [DATE]

4 mm posterior right frontal cortex nodule on [DATE]

Other Brain findings: Band of T2 hyperintensities newly
seen/increase in the right thalamus, consistent with interval
infarct. Diffuse chronic small vessel ischemia. No acute hemorrhage,
hydrocephalus, or shift.

Vascular: Major vessels are enhancing

Skull and upper cervical spine: Normal marrow signal

Sinuses/Orbits: Negative
IMPRESSION: 1. Single new punctate lesion in the left parietal cortex on [DATE].
2. Pre-existing metastatic disease is unchanged, although there is
new vasogenic edema associated with the largest lesion located in
the left cerebellum.
3. Interval perforator infarct in the right thalamus.

## 2021-03-26 MED ORDER — HEPARIN SOD (PORK) LOCK FLUSH 100 UNIT/ML IV SOLN
500.0000 [IU] | Freq: Once | INTRAVENOUS | Status: AC
  Administered 2021-03-26: 500 [IU] via INTRAVENOUS

## 2021-03-26 MED ORDER — SODIUM CHLORIDE 0.9% FLUSH
10.0000 mL | INTRAVENOUS | Status: DC | PRN
  Administered 2021-03-26: 10 mL via INTRAVENOUS

## 2021-03-26 MED ORDER — GADOBENATE DIMEGLUMINE 529 MG/ML IV SOLN
15.0000 mL | Freq: Once | INTRAVENOUS | Status: AC | PRN
  Administered 2021-03-26: 15 mL via INTRAVENOUS

## 2021-03-26 NOTE — Progress Notes (Signed)
Patient states doing well. No symptoms reported at this time. Meaningful use complete.  Patient notified of 2:00pm-03/29/21 telephone appointment and verbalized understanding.  Patient preferred contact # [phone]

## 2021-03-29 ENCOUNTER — Encounter: Payer: Self-pay | Admitting: *Deleted

## 2021-03-29 ENCOUNTER — Ambulatory Visit
Admission: RE | Admit: 2021-03-29 | Discharge: 2021-03-29 | Disposition: A | Discharge status: Home / Self Care | Payer: Managed Care, Other (non HMO) | Source: Ambulatory Visit | Attending: Radiation Oncology | Admitting: Radiation Oncology

## 2021-03-29 DIAGNOSIS — C3491 Malignant neoplasm of unspecified part of right bronchus or lung: Secondary | ICD-10-CM

## 2021-03-29 NOTE — Progress Notes (Signed)
Radiation Oncology         [phone] ________________________________  Outpatient Follow Up - Conducted via telephone due to current COVID-19 concerns for limiting patient exposure  I spoke with the patient to conduct this consult visit via telephone to spare the patient unnecessary potential exposure in the healthcare setting during the current COVID-19 pandemic. The patient was notified in advance and was offered a [place] meeting to allow for face to face communication but unfortunately reported that they did not have the appropriate resources/technology to support such a visit and instead preferred to proceed with a telephone visit.   Name: Joseph Atkins MRN: [id number]  Date of Service: 03/29/2021  DOB: 09/12/1960  Follow Up   Diagnosis:  Stage IV, cT3, N0, M1c, non-small cell lung cancer, adenocarcinoma of the right lower lobe  Prior Radiation:   06/19/2020 SRS Treatment: Each site was treated to 20 Gy in 1 fraction PTV_14 Rt Cereb 48mm PTV_13 Rt Occ 96mm PTV_12 Rt Occ 4mm PTV_11 Lt Front 55mm PTV_10 Rt Par 88mm PTV_9 Lt Par 52mm  03/05/20 SRS Treatment: The following lesions were treated to 20 Gy in 1 fraction PTV2 Rt Temp 63mm PTV3 Lt Occ 90mm PTV4 Lt Occ 6mm PTV5 Rt Temp 22mm PTV6 Lt Occ 74mm PTV7 Rt Par 1mm PTV8 Lt Par 71mm  11/25/19 - 01/09/20: The patient was treated to the disease within the right lung initially to a dose of 60 Gy using a 4 field, 3-D conformal technique. The patient then received a cone down boost treatment for an additional 6 Gy. This yielded a final total dose of 66 Gy.   11/14/19 SRS Treatment:  PTV1 Lt Cerebellum 28 mm, 18 Gy in 1 fraction  Narrative: This is a pleasant 60 y.o. male with a history of stage IV non-small cell lung cancer, adenocarcinoma arising in the right lower lobe with what was originally a solitary brain metastasis.  He was treated with stereotactic radiosurgery [facility]) in July 2021, and definitive chemoradiation to the lung which  he completed in September 2021.  He has been receiving consolidative Keytruda with Dr. Julien Nordmann.  He  had a difficult time with trying to taper his steroids with what resulted in rebound edema following the tapering or discontinuing certain doses, and having to go back to steroids.  Ultimately he was even treated with a Avastin.  He had SRS to new disease sites in November 2021 and in February 2022.  He has also continued on systemic Keytruda and consolidation with Dr. Julien Nordmann.  He continues to be stable systemically as well by imaging with his most recent restaging CT scan in November 2022.  Again his MRI scan on 03/26/2021  showed no new or progressive disease and stability in the largest of his previously treated disease in the left cerebellum.  Initially it was read that there was a single new punctate lesion in the left parietal cortex however his prior treatment images were fused to his diagnostic MRIs, and it was felt that this represented a treated lesion that was PTV 8.  He also had an interval perforator infarct in the right thalamus.   He's contacted today to discuss these results.   On review of systems, the patient reports that he is okay. He reports headaches a few times a week that can be anywhere from his temple to his posterior head, right, or left, but can last for hours. NSAIDs help but he is trying to minimize the use of these. He reports he  does have light sensitivity but no other aura type symptoms. He denies any nausea, vomiting, seizures, dizziness or changes in speech, movement, vision or hearing. He reports he is still having itchy dry skin and uses cream for this. He has been troubled by his loss of control over his life in general. He's struggling to find a sense of purpose. He had plans to work on cars prior to his diagnosis. He doesn't have many friends to spend time with since getting sober, but has support of his dad, wife, and son Joseph Atkins. Fatigue continues to keep him from  activities as well.     PAST MEDICAL HISTORY:  Past Medical History:  Diagnosis Date   Cancer ([place])    lung cancer   Diverticulosis    GERD (gastroesophageal reflux disease)    Hx of small bowel obstruction    Hyperlipidemia    Hypertension    Hypothyroidism    IBS (irritable bowel syndrome)    Substance abuse ([place])    Alcoholic, Drug addition   Thyroid disease     PAST SURGICAL HISTORY: Past Surgical History:  Procedure Laterality Date   arm surgery Right    BRONCHIAL BRUSHINGS  10/24/2019   Procedure: BRONCHIAL BRUSHINGS;  Surgeon: Collene Gobble, MD;  Location: [place];  Service: Cardiopulmonary;;  right lower lobe    BRONCHIAL BRUSHINGS  11/05/2019   Procedure: BRONCHIAL BRUSHINGS;  Surgeon: Collene Gobble, MD;  Location: [facility] ENDOSCOPY;  Service: Pulmonary;;   BRONCHIAL NEEDLE ASPIRATION BIOPSY  10/24/2019   Procedure: BRONCHIAL NEEDLE ASPIRATION BIOPSIES;  Surgeon: Collene Gobble, MD;  Location: [place];  Service: Cardiopulmonary;;   BRONCHIAL NEEDLE ASPIRATION BIOPSY  11/05/2019   Procedure: BRONCHIAL NEEDLE ASPIRATION BIOPSIES;  Surgeon: Collene Gobble, MD;  Location: [facility] ENDOSCOPY;  Service: Pulmonary;;   ENDOBRONCHIAL ULTRASOUND N/A 10/24/2019   Procedure: ENDOBRONCHIAL ULTRASOUND;  Surgeon: Collene Gobble, MD;  Location: [place];  Service: Cardiopulmonary;  Laterality: N/A;   FINGER SURGERY Right    Middle   IR IMAGING GUIDED PORT INSERTION  11/19/2019   VIDEO BRONCHOSCOPY N/A 10/24/2019   Procedure: VIDEO BRONCHOSCOPY WITHOUT FLUORO;  Surgeon: Collene Gobble, MD;  Location: [place];  Service: Cardiopulmonary;  Laterality: N/A;   VIDEO BRONCHOSCOPY WITH ENDOBRONCHIAL NAVIGATION N/A 11/05/2019   Procedure: VIDEO BRONCHOSCOPY WITH ENDOBRONCHIAL NAVIGATION;  Surgeon: Collene Gobble, MD;  Location: [place] ENDOSCOPY;  Service: Pulmonary;  Laterality: N/A;    PAST SOCIAL HISTORY:  Social History   Socioeconomic History   Marital status: Married    Spouse  name: Not on file   Number of children: 2   Years of education: Not on file   Highest education level: Not on file  Occupational History   Occupation: supervisor    Employer: [employer]  Tobacco Use   Smoking status: Every Day    Packs/day: 1.50    Types: Cigarettes   Smokeless tobacco: Former    Types: Nurse, children's Use: Never used  Substance and Sexual Activity   Alcohol use: No    Comment: Alcoholic clean for 6 years   Drug use: No    Comment: Recovering Drug Addict-clean for 6 years   Sexual activity: Not on file  Other Topics Concern   Not on file  Social History Narrative   Not on file   Social Determinants of Health   Financial Resource Strain: Not on file  Food Insecurity: Not on file  Transportation Needs: Not on  file  Physical Activity: Not on file  Stress: Not on file  Social Connections: Not on file  Intimate Partner Violence: Not on file  The patient is married and lives in [place]. He enjoys working on cars, shooting guns, and has been sober from drugs and alcohol.  PAST FAMILY HISTORY: Family History  Problem Relation Age of Onset   Epilepsy Mother    Heart disease Mother    Heart disease Brother    Lung cancer Paternal Uncle     MEDICATIONS  Current Outpatient Medications  Medication Sig Dispense Refill   albuterol (PROVENTIL HFA;VENTOLIN HFA) 108 (90 Base) MCG/ACT inhaler Inhale 2 puffs into the lungs every 6 (six) hours as needed for wheezing or shortness of breath. 1 Inhaler 0   ANORO ELLIPTA 62.5-25 MCG/INH AEPB 1 puff daily.     cyclobenzaprine (FLEXERIL) 10 MG tablet Take 10 mg by mouth 2 (two) times daily as needed for muscle spasms.     docusate sodium (COLACE) 100 MG capsule Take 100 mg by mouth daily.     DULoxetine (CYMBALTA) 20 MG capsule Take 1 capsule (20 mg total) by mouth 2 (two) times daily. 60 capsule 5   fluticasone furoate-vilanterol (BREO ELLIPTA) 100-25 MCG/INH AEPB Inhale 1 puff into the lungs daily. 30  each 3   levothyroxine (SYNTHROID) 125 MCG tablet Take 1 tablet (125 mcg total) by mouth daily. 30 tablet 1   lidocaine-prilocaine (EMLA) cream Apply 1 application topically as needed. 30 g 0   omeprazole (PRILOSEC) 40 MG capsule Take 1 capsule (40 mg total) by mouth daily. 90 capsule 4   omeprazole (PRILOSEC) 40 MG capsule 1 capsule     ondansetron (ZOFRAN) 4 MG tablet Take 1 tablet (4 mg total) by mouth every 8 (eight) hours as needed. (Patient not taking: No sig reported) 40 tablet 2   oxyCODONE-acetaminophen (PERCOCET/ROXICET) 5-325 MG tablet Take 1 tablet by mouth every 4 (four) hours as needed for severe pain. 30 tablet 0   pembrolizumab (KEYTRUDA) 100 MG/4ML SOLN See admin instructions.     polyethylene glycol powder (MIRALAX) powder Take 17 g by mouth daily. 255 g 11   prochlorperazine (COMPAZINE) 10 MG tablet Take 1 tablet (10 mg total) by mouth every 6 (six) hours as needed. (Patient not taking: No sig reported) 30 tablet 2   TRULANCE 3 MG TABS Take 1 tablet by mouth daily.     zolpidem (AMBIEN CR) 12.5 MG CR tablet Take 1 tablet (12.5 mg total) by mouth at bedtime as needed for sleep. 30 tablet 5   No current facility-administered medications for this encounter.    ALLERGIES:  Allergies  Allergen Reactions   Penicillins Other (See Comments)    Childhood allergy.  Has patient had a PCN reaction causing immediate rash, facial/tongue/throat swelling, SOB or lightheadedness with hypotension: unknown Has patient had a PCN reaction causing severe rash involving mucus membranes or skin necrosis: unknown Has patient had a PCN reaction that required hospitalization: unknown Has patient had a PCN reaction occurring within the last 10 years: no If all of the above answers are "NO", then may proceed with Cephalosporin use.    Physical exam: Unable to assess given encounter type.  Impression/Plan: 1. Stage IV, cT3, N0, M1c, non-small cell lung cancer, adenocarcinoma of the right lower  lobe with progressive brain disease.  The patient's MRI was reviewed in brain oncology conference. We discussed his imaging to be fused. The area called as "new" has previously been treated with radiation.  We will continue to follow this closely with a 2-3 month interval repeat MRI. He will also continue with Keytruda under the care of Dr. Julien Nordmann.  2. Edema, Infarct, and chronic vessel disease on imaging. We appreciate Dr. Renda Rolls input and will follow along peripherally with his next scan. The patient will call us or Dr. Mickeal Skinner sooner if headahces progress. 3. Apathy of daily activities, likely depression. I suggested the patient follow up with his PCP about his antidepressant Cymbalta which he is only taking once a day. We also discussed evaluation with social work team to see about resources around the cancer center and for help with some insurance questions his wife has too.    Given current concerns for patient exposure during the COVID-19 pandemic, this encounter was conducted via telephone.  The patient has provided two factor identification and has given verbal consent for this type of encounter and has been advised to only accept a meeting of this type in a secure network environment. The time spent during this encounter was 60 minutes including preparation, discussion, and coordination of the patient's care. The attendants for this meeting include Hayden Pedro  and OBALOLUWA DELATTE and his wife Jaysin Gayler.   Hayden Pedro was located at [facility] Radiation Oncology Department.  Plato Alspaugh and his wife Vaughan Basta were located at home    Carola Rhine, [facility]

## 2021-03-29 NOTE — Progress Notes (Signed)
[place] Psychosocial Distress Screening Clinical Social Work  Clinical Social Work was referred by distress screening protocol.  The patient scored a 7 on the Psychosocial Distress Thermometer which indicates moderate distress. Clinical Social Worker contacted patient by phone to assess for distress and other psychosocial needs.  Patient stated he was doing "pretty good" and did not express any concerns at this time.  CSW and patient discussed the importance of support and CSW reviewed the support team and services at [facility].  CSW provided contact information and encouraged patient to call with questions or concerns.     ONCBCN DISTRESS SCREENING 03/26/2021  Screening Type   Distress experienced in past week (1-10) 7  Emotional problem type Nervousness/Anxiety;Adjusting to illness  Information Concerns Type   Other      Johnnye Lana, MSW, LCSW, OSW-C Clinical Social Worker [facility] [phone]       Jill Stopka P, LCSW

## 2021-03-30 ENCOUNTER — Inpatient Hospital Stay (HOSPITAL_BASED_OUTPATIENT_CLINIC_OR_DEPARTMENT_OTHER): Payer: Managed Care, Other (non HMO) | Admitting: Internal Medicine

## 2021-03-30 ENCOUNTER — Inpatient Hospital Stay: Payer: Managed Care, Other (non HMO) | Attending: Internal Medicine

## 2021-03-30 ENCOUNTER — Inpatient Hospital Stay: Payer: Managed Care, Other (non HMO)

## 2021-03-30 ENCOUNTER — Other Ambulatory Visit: Payer: Self-pay

## 2021-03-30 ENCOUNTER — Encounter: Payer: Self-pay | Admitting: Internal Medicine

## 2021-03-30 VITALS — BP 132/92 | HR 91

## 2021-03-30 VITALS — BP 162/95 | HR 106 | Temp 97.4°F | Resp 19 | Ht 68.0 in | Wt 168.4 lb

## 2021-03-30 DIAGNOSIS — R0609 Other forms of dyspnea: Secondary | ICD-10-CM | POA: Diagnosis not present

## 2021-03-30 DIAGNOSIS — Z7982 Long term (current) use of aspirin: Secondary | ICD-10-CM | POA: Insufficient documentation

## 2021-03-30 DIAGNOSIS — E785 Hyperlipidemia, unspecified: Secondary | ICD-10-CM | POA: Diagnosis not present

## 2021-03-30 DIAGNOSIS — I2510 Atherosclerotic heart disease of native coronary artery without angina pectoris: Secondary | ICD-10-CM | POA: Insufficient documentation

## 2021-03-30 DIAGNOSIS — E039 Hypothyroidism, unspecified: Secondary | ICD-10-CM | POA: Diagnosis not present

## 2021-03-30 DIAGNOSIS — I10 Essential (primary) hypertension: Secondary | ICD-10-CM | POA: Insufficient documentation

## 2021-03-30 DIAGNOSIS — C7931 Secondary malignant neoplasm of brain: Secondary | ICD-10-CM

## 2021-03-30 DIAGNOSIS — R110 Nausea: Secondary | ICD-10-CM | POA: Diagnosis not present

## 2021-03-30 DIAGNOSIS — Z801 Family history of malignant neoplasm of trachea, bronchus and lung: Secondary | ICD-10-CM | POA: Insufficient documentation

## 2021-03-30 DIAGNOSIS — I700 Atherosclerosis of aorta: Secondary | ICD-10-CM | POA: Diagnosis not present

## 2021-03-30 DIAGNOSIS — K589 Irritable bowel syndrome without diarrhea: Secondary | ICD-10-CM | POA: Insufficient documentation

## 2021-03-30 DIAGNOSIS — Z5112 Encounter for antineoplastic immunotherapy: Secondary | ICD-10-CM | POA: Diagnosis not present

## 2021-03-30 DIAGNOSIS — K219 Gastro-esophageal reflux disease without esophagitis: Secondary | ICD-10-CM | POA: Diagnosis not present

## 2021-03-30 DIAGNOSIS — F1011 Alcohol abuse, in remission: Secondary | ICD-10-CM | POA: Diagnosis not present

## 2021-03-30 DIAGNOSIS — C3431 Malignant neoplasm of lower lobe, right bronchus or lung: Secondary | ICD-10-CM | POA: Diagnosis not present

## 2021-03-30 DIAGNOSIS — Z79899 Other long term (current) drug therapy: Secondary | ICD-10-CM | POA: Insufficient documentation

## 2021-03-30 DIAGNOSIS — Z95828 Presence of other vascular implants and grafts: Secondary | ICD-10-CM

## 2021-03-30 DIAGNOSIS — C3491 Malignant neoplasm of unspecified part of right bronchus or lung: Secondary | ICD-10-CM

## 2021-03-30 LAB — CBC WITH DIFFERENTIAL (CANCER CENTER ONLY)
Abs Immature Granulocytes: 0.02 10*3/uL (ref 0.00–0.07)
Basophils Absolute: 0.1 10*3/uL (ref 0.0–0.1)
Basophils Relative: 1 %
Eosinophils Absolute: 0.2 10*3/uL (ref 0.0–0.5)
Eosinophils Relative: 3 %
HCT: 38.8 % — ABNORMAL LOW (ref 39.0–52.0)
Hemoglobin: 12.7 g/dL — ABNORMAL LOW (ref 13.0–17.0)
Immature Granulocytes: 0 %
Lymphocytes Relative: 18 %
Lymphs Abs: 1.3 10*3/uL (ref 0.7–4.0)
MCH: 29.6 pg (ref 26.0–34.0)
MCHC: 32.7 g/dL (ref 30.0–36.0)
MCV: 90.4 fL (ref 80.0–100.0)
Monocytes Absolute: 0.7 10*3/uL (ref 0.1–1.0)
Monocytes Relative: 10 %
Neutro Abs: 4.7 10*3/uL (ref 1.7–7.7)
Neutrophils Relative %: 68 %
Platelet Count: 223 10*3/uL (ref 150–400)
RBC: 4.29 MIL/uL (ref 4.22–5.81)
RDW: 12.3 % (ref 11.5–15.5)
WBC Count: 6.9 10*3/uL (ref 4.0–10.5)
nRBC: 0 % (ref 0.0–0.2)

## 2021-03-30 LAB — CMP (CANCER CENTER ONLY)
ALT: 16 U/L (ref 0–44)
AST: 18 U/L (ref 15–41)
Albumin: 3.8 g/dL (ref 3.5–5.0)
Alkaline Phosphatase: 81 U/L (ref 38–126)
Anion gap: 10 (ref 5–15)
BUN: 7 mg/dL (ref 6–20)
CO2: 22 mmol/L (ref 22–32)
Calcium: 8.8 mg/dL — ABNORMAL LOW (ref 8.9–10.3)
Chloride: 105 mmol/L (ref 98–111)
Creatinine: 0.91 mg/dL (ref 0.61–1.24)
GFR, Estimated: >60 mL/min (ref >60)
Glucose, Bld: 157 mg/dL — ABNORMAL HIGH (ref 70–99)
Potassium: 3.7 mmol/L (ref 3.5–5.1)
Sodium: 137 mmol/L (ref 135–145)
Total Bilirubin: 0.6 mg/dL (ref 0.3–1.2)
Total Protein: 6.7 g/dL (ref 6.5–8.1)

## 2021-03-30 MED ORDER — SODIUM CHLORIDE 0.9 % IV SOLN
200.0000 mg | Freq: Once | INTRAVENOUS | Status: AC
  Administered 2021-03-30: 200 mg via INTRAVENOUS
  Filled 2021-03-30: qty 8

## 2021-03-30 MED ORDER — SODIUM CHLORIDE 0.9% FLUSH
10.0000 mL | INTRAVENOUS | Status: DC | PRN
  Administered 2021-03-30: 10 mL

## 2021-03-30 MED ORDER — SODIUM CHLORIDE 0.9 % IV SOLN
Freq: Once | INTRAVENOUS | Status: AC
Start: 2021-03-30 — End: 2021-03-30

## 2021-03-30 MED ORDER — HEPARIN SOD (PORK) LOCK FLUSH 100 UNIT/ML IV SOLN
500.0000 [IU] | Freq: Once | INTRAVENOUS | Status: AC | PRN
  Administered 2021-03-30: 500 [IU]

## 2021-03-30 MED ORDER — SODIUM CHLORIDE 0.9% FLUSH
10.0000 mL | Freq: Once | INTRAVENOUS | Status: AC
  Administered 2021-03-30: 10 mL

## 2021-03-30 NOTE — Progress Notes (Signed)
[place] [phone]   [phone]  OFFICE PROGRESS NOTE  Joseph Sill, NP [name] [place] [address]  DIAGNOSIS: Stage IV (T3, N0, M1C) non-small cell lung cancer, adenocarcinoma.  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 He was diagnosed in July 2021.   Molecular Biomarkers:  MSI-High DETECTED Pembrolizumab Atezolizumab, Avelumab, Cemiplimab, Dostarlimab, Durvalumab, Ipilimumab, Nivolumab   STK11Splice Site SNV 9.7% Everolimus, Temsirolimus Yes   KRASG12D 1.7% Binimetinib Yes   [id number] 0.4%   Niraparib, Olaparib, Rucaparib, Talazoparib, Tazemetostat Yes   PRIOR THERAPY:  1) SRS to the solitary brain metastasis under the care of Dr. Lisbeth Renshaw. Last treatment 11/14/19. 2) Weekly concurrent chemoradiation with carboplatin for an AUC of 2, paclitaxel 45 mg/m2.  First dose expected on 11/25/2019. Status post 7 cycles, last dose was giving 01/06/2020 with partial response.    CURRENT THERAPY:  1)  Immunotherapy with Keytruda 200 mg IV every 3 weeks.  First dose February 10, 2020 for a patient with MSI high.  Status post 18  cycles. 2) Avastin 15 mg/KG every 3 weeks.  First dose today for the vasogenic edema of the brain.S/P 7 cycles.  INTERVAL HISTORY: Joseph Atkins 60 y.o. male returns to the clinic today for follow-up visit.  The patient is feeling fine today with no concerning complaints except for mild fatigue.  He denied having any current chest pain, shortness of breath, cough or hemoptysis.  He denied having any fever or chills.  He has no nausea, vomiting, diarrhea or constipation.  He has no headache or visual changes.  He has no recent weight loss or night sweats.  The patient is here today for evaluation before starting cycle #19 of his treatment.  MEDICAL HISTORY: Past Medical History:  Diagnosis Date   Cancer  ([place])    lung cancer   Diverticulosis    GERD (gastroesophageal reflux disease)    Hx of small bowel obstruction    Hyperlipidemia    Hypertension    Hypothyroidism    IBS (irritable bowel syndrome)    Substance abuse ([place])    Alcoholic, Drug addition   Thyroid disease     ALLERGIES:  is allergic to penicillins.  MEDICATIONS:  Current Outpatient Medications  Medication Sig Dispense Refill   albuterol (PROVENTIL HFA;VENTOLIN HFA) 108 (90 Base) MCG/ACT inhaler Inhale 2 puffs into the lungs every 6 (six) hours as needed for wheezing or shortness of breath. 1 Inhaler 0   ANORO ELLIPTA 62.5-25 MCG/INH AEPB 1 puff daily.     cyclobenzaprine (FLEXERIL) 10 MG tablet Take 10 mg by mouth 2 (two) times daily as needed for muscle spasms.     docusate sodium (COLACE) 100 MG capsule Take 100 mg by mouth daily.     DULoxetine (CYMBALTA) 20 MG capsule Take 1 capsule (20 mg total) by mouth 2 (two) times daily. 60 capsule 5   fluticasone furoate-vilanterol (BREO ELLIPTA) 100-25 MCG/INH AEPB Inhale 1 puff into the lungs daily. 30 each 3   levothyroxine (SYNTHROID) 125 MCG tablet Take 1 tablet (125 mcg total) by mouth daily. 30 tablet 1   lidocaine-prilocaine (EMLA) cream Apply 1 application topically as needed. 30 g 0   omeprazole (PRILOSEC) 40 MG capsule Take 1 capsule (40 mg total) by mouth daily. 90 capsule 4   omeprazole (PRILOSEC) 40 MG capsule 1 capsule     ondansetron (  ZOFRAN) 4 MG tablet Take 1 tablet (4 mg total) by mouth every 8 (eight) hours as needed. 40 tablet 2   oxyCODONE-acetaminophen (PERCOCET/ROXICET) 5-325 MG tablet Take 1 tablet by mouth every 4 (four) hours as needed for severe pain. 30 tablet 0   pembrolizumab (KEYTRUDA) 100 MG/4ML SOLN See admin instructions.     polyethylene glycol powder (MIRALAX) powder Take 17 g by mouth daily. 255 g 11   prochlorperazine (COMPAZINE) 10 MG tablet Take 1 tablet (10 mg total) by mouth every 6 (six) hours as needed. 30 tablet 2   TRULANCE 3 MG  TABS Take 1 tablet by mouth daily.     zolpidem (AMBIEN CR) 12.5 MG CR tablet Take 1 tablet (12.5 mg total) by mouth at bedtime as needed for sleep. 30 tablet 5   No current facility-administered medications for this visit.    SURGICAL HISTORY:  Past Surgical History:  Procedure Laterality Date   arm surgery Right    BRONCHIAL BRUSHINGS  10/24/2019   Procedure: BRONCHIAL BRUSHINGS;  Surgeon: Byrum, Robert S, MD;  Location: [facility];  Service: Cardiopulmonary;;  right lower lobe    BRONCHIAL BRUSHINGS  11/05/2019   Procedure: BRONCHIAL BRUSHINGS;  Surgeon: Byrum, Robert S, MD;  Location: [facility];  Service: Pulmonary;;   BRONCHIAL NEEDLE ASPIRATION BIOPSY  10/24/2019   Procedure: BRONCHIAL NEEDLE ASPIRATION BIOPSIES;  Surgeon: Byrum, Robert S, MD;  Location: [facility];  Service: Cardiopulmonary;;   BRONCHIAL NEEDLE ASPIRATION BIOPSY  11/05/2019   Procedure: BRONCHIAL NEEDLE ASPIRATION BIOPSIES;  Surgeon: Byrum, Robert S, MD;  Location: [facility];  Service: Pulmonary;;   ENDOBRONCHIAL ULTRASOUND N/A 10/24/2019   Procedure: ENDOBRONCHIAL ULTRASOUND;  Surgeon: Byrum, Robert S, MD;  Location: [facility];  Service: Cardiopulmonary;  Laterality: N/A;   FINGER SURGERY Right    Middle   IR IMAGING GUIDED PORT INSERTION  11/19/2019   VIDEO BRONCHOSCOPY N/A 10/24/2019   Procedure: VIDEO BRONCHOSCOPY WITHOUT FLUORO;  Surgeon: Byrum, Robert S, MD;  Location: [facility];  Service: Cardiopulmonary;  Laterality: N/A;   VIDEO BRONCHOSCOPY WITH ENDOBRONCHIAL NAVIGATION N/A 11/05/2019   Procedure: VIDEO BRONCHOSCOPY WITH ENDOBRONCHIAL NAVIGATION;  Surgeon: Byrum, Robert S, MD;  Location: [facility];  Service: Pulmonary;  Laterality: N/A;    REVIEW OF SYSTEMS:  A comprehensive review of systems was negative except for: Constitutional: positive for fatigue   PHYSICAL EXAMINATION: General appearance: alert, cooperative, fatigued, and no distress Head: Normocephalic, without obvious abnormality,  atraumatic Neck: no adenopathy, no JVD, supple, symmetrical, trachea midline, and thyroid not enlarged, symmetric, no tenderness/mass/nodules Lymph nodes: Cervical, supraclavicular, and axillary nodes normal. Resp: clear to auscultation bilaterally Back: symmetric, no curvature. ROM normal. No CVA tenderness. Cardio: regular rate and rhythm, S1, S2 normal, no murmur, click, rub or gallop GI: soft, non-tender; bowel sounds normal; no masses,  no organomegaly Extremities: extremities normal, atraumatic, no cyanosis or edema  ECOG PERFORMANCE STATUS: 1 - Symptomatic but completely ambulatory  Blood pressure (!) 162/95, pulse (!) 106, temperature (!) 97.4 F (36.3 C), temperature source Tympanic, resp. rate 19, height 5' 8" (1.727 m), weight 168 lb 6.4 oz (76.4 kg), SpO2 100 %.  LABORATORY DATA: Lab Results  Component Value Date   WBC 6.9 03/30/2021   HGB 12.7 (L) 03/30/2021   HCT 38.8 (L) 03/30/2021   MCV 90.4 03/30/2021   PLT 223 03/30/2021      Chemistry      Component Value Date/Time   NA 136 03/09/2021 0931   NA 141 03/08/2017 1707     K 3.8 03/09/2021 0931   CL 104 03/09/2021 0931   CO2 24 03/09/2021 0931   BUN 6 03/09/2021 0931   BUN 12 03/08/2017 1707   CREATININE 0.79 03/09/2021 0931      Component Value Date/Time   CALCIUM 9.0 03/09/2021 0931   ALKPHOS 77 03/09/2021 0931   AST 17 03/09/2021 0931   ALT 12 03/09/2021 0931   BILITOT 0.7 03/09/2021 0931       RADIOGRAPHIC STUDIES: CT Chest W Contrast  Result Date: 03/07/2021 CLINICAL DATA:  60 year old male with history of non-small cell lung cancer status post chemotherapy and radiation therapy now complete. Ongoing immunotherapy. EXAM: CT CHEST, ABDOMEN, AND PELVIS WITH CONTRAST TECHNIQUE: Multidetector CT imaging of the chest, abdomen and pelvis was performed following the standard protocol during bolus administration of intravenous contrast. CONTRAST:  60m OMNIPAQUE IOHEXOL 350 MG/ML SOLN COMPARISON:  CT of the  chest, abdomen and pelvis 12/11/2020. FINDINGS: CT CHEST FINDINGS Cardiovascular: Heart size is normal. Small to moderate volume of pericardial fluid and thickening, similar to the prior study. No pericardial calcification. There is aortic atherosclerosis, as well as atherosclerosis of the great vessels of the mediastinum and the coronary arteries, including calcified atherosclerotic plaque in the left main, left anterior descending and right coronary arteries. Right internal jugular single-lumen porta cath with tip terminating in the right atrium. Mediastinum/Nodes: No pathologically enlarged mediastinal or hilar lymph nodes. Esophagus is unremarkable in appearance. No axillary lymphadenopathy. Lungs/Pleura: Treated right lower lobe mass appears grossly similar in size to the prior examination, estimated to measure approximately 3.9 x 2.9 cm on today's study (axial image 45 of series 2). Surrounding areas with mild traction bronchiectasis, extensive thickening of the peribronchovascular interstitium, ground-glass attenuation and surrounding areas of septal thickening noted in the central portion of the right middle lobe and right lower lobe, compatible with chronic postradiation mass-like fibrosis, similar to the prior examination. No other suspicious appearing pulmonary nodules or masses are noted. No acute consolidative airspace disease. Trace amount of right-sided pleural fluid and thickening, stable compared to the prior study. No left-sided pleural fluid. Musculoskeletal: There are no aggressive appearing lytic or blastic lesions noted in the visualized portions of the skeleton. CT ABDOMEN PELVIS FINDINGS Hepatobiliary: Small ovoid shaped low-attenuation lesion in the central aspect of the left lobe of the liver, stable in size and appearance compared to prior studies, too small to definitively characterize, but favored to represent a small cyst. No other suspicious appearing hepatic lesions. No intra or  extrahepatic biliary ductal dilatation. Partially calcified gallstone in the gallbladder. No findings to suggest an acute cholecystitis at this time. Pancreas: No pancreatic mass. No pancreatic ductal dilatation. No pancreatic or peripancreatic fluid collections or inflammatory changes. Spleen: Unremarkable. Adrenals/Urinary Tract: Bilateral kidneys and adrenal glands are normal in appearance. No hydroureteronephrosis. Urinary bladder is normal in appearance. Stomach/Bowel: The appearance of the stomach is normal. No pathologic dilatation of small bowel or colon. Normal appendix. Vascular/Lymphatic: Aortic atherosclerosis, without evidence of aneurysm or dissection in the abdominal or pelvic vasculature. Complete occlusion of the left common iliac, left internal iliac and left external iliac arteries, with distal reconstitution of flow from collateral pathways, similar to prior examinations. Retroaortic left renal vein (normal anatomical variant) incidentally noted. No lymphadenopathy noted in the abdomen or pelvis. Reproductive: Prostate gland and seminal vesicles are unremarkable in appearance. Other: No significant volume of ascites.  No pneumoperitoneum. Musculoskeletal: There are no aggressive appearing lytic or blastic lesions noted in the visualized portions of the  skeleton. IMPRESSION: 1. Stable treatment related changes of postradiation mass-like fibrosis in the medial aspect of the right lung base, with no definitive findings to suggest locally recurrent disease or new metastatic disease in the chest, abdomen or pelvis. 2. Small amount of pericardial fluid and/or thickening, similar to the prior study. 3. Aortic atherosclerosis, in addition to left main and 2 vessel coronary artery disease. Chronic occlusion of the left common iliac, internal iliac and external iliac arteries with distal reconstitution of flow from collateral pathways, similar to prior studies. 4. Cholelithiasis without evidence of acute  cholecystitis at this time. 5. Additional incidental findings, as above. Electronically Signed   By: Daniel  Entrikin M.D.   On: 03/07/2021 12:35   MR Brain W Wo Contrast  Result Date: 03/27/2021 CLINICAL DATA:  History of lung and brain cancer. Surveillance imaging EXAM: MRI HEAD WITHOUT AND WITH CONTRAST TECHNIQUE: Multiplanar, multiecho pulse sequences of the brain and surrounding structures were obtained without and with intravenous contrast. CONTRAST:  15mL MULTIHANCE GADOBENATE DIMEGLUMINE 529 MG/ML IV SOLN COMPARISON:  12/11/2020 FINDINGS: BRAIN New Lesions: Punctate nodular enhancement along the left parietal cortex on 11:113. This was not seen on prior or even more remote imaging. Larger lesions: None. Stable or Smaller lesions: Partially mineralized, peripherally enhancing left cerebellar lesion measuring 2.8 cm, unchanged in size although there is new mild vasogenic edema. Punctate left paramedian occipital lobe nodule on 11:62 is unchanged 6 mm nodule at the high left frontal parietal junction on 11:134 4 mm posterior right frontal cortex nodule on 11:120 Other Brain findings: Band of T2 hyperintensities newly seen/increase in the right thalamus, consistent with interval infarct. Diffuse chronic small vessel ischemia. No acute hemorrhage, hydrocephalus, or shift. Vascular: Major vessels are enhancing Skull and upper cervical spine: Normal marrow signal Sinuses/Orbits: Negative IMPRESSION: 1. Single new punctate lesion in the left parietal cortex on 11:113. 2. Pre-existing metastatic disease is unchanged, although there is new vasogenic edema associated with the largest lesion located in the left cerebellum. 3. Interval perforator infarct in the right thalamus. Electronically Signed   By: Jonathan  Watts M.D.   On: 03/27/2021 08:00   CT Abdomen Pelvis W Contrast  Result Date: 03/07/2021 CLINICAL DATA:  60-year-old male with history of non-small cell lung cancer status post chemotherapy and  radiation therapy now complete. Ongoing immunotherapy. EXAM: CT CHEST, ABDOMEN, AND PELVIS WITH CONTRAST TECHNIQUE: Multidetector CT imaging of the chest, abdomen and pelvis was performed following the standard protocol during bolus administration of intravenous contrast. CONTRAST:  80mL OMNIPAQUE IOHEXOL 350 MG/ML SOLN COMPARISON:  CT of the chest, abdomen and pelvis 12/11/2020. FINDINGS: CT CHEST FINDINGS Cardiovascular: Heart size is normal. Small to moderate volume of pericardial fluid and thickening, similar to the prior study. No pericardial calcification. There is aortic atherosclerosis, as well as atherosclerosis of the great vessels of the mediastinum and the coronary arteries, including calcified atherosclerotic plaque in the left main, left anterior descending and right coronary arteries. Right internal jugular single-lumen porta cath with tip terminating in the right atrium. Mediastinum/Nodes: No pathologically enlarged mediastinal or hilar lymph nodes. Esophagus is unremarkable in appearance. No axillary lymphadenopathy. Lungs/Pleura: Treated right lower lobe mass appears grossly similar in size to the prior examination, estimated to measure approximately 3.9 x 2.9 cm on today's study (axial image 45 of series 2). Surrounding areas with mild traction bronchiectasis, extensive thickening of the peribronchovascular interstitium, ground-glass attenuation and surrounding areas of septal thickening noted in the central portion of the right middle lobe and   right lower lobe, compatible with chronic postradiation mass-like fibrosis, similar to the prior examination. No other suspicious appearing pulmonary nodules or masses are noted. No acute consolidative airspace disease. Trace amount of right-sided pleural fluid and thickening, stable compared to the prior study. No left-sided pleural fluid. Musculoskeletal: There are no aggressive appearing lytic or blastic lesions noted in the visualized portions of the  skeleton. CT ABDOMEN PELVIS FINDINGS Hepatobiliary: Small ovoid shaped low-attenuation lesion in the central aspect of the left lobe of the liver, stable in size and appearance compared to prior studies, too small to definitively characterize, but favored to represent a small cyst. No other suspicious appearing hepatic lesions. No intra or extrahepatic biliary ductal dilatation. Partially calcified gallstone in the gallbladder. No findings to suggest an acute cholecystitis at this time. Pancreas: No pancreatic mass. No pancreatic ductal dilatation. No pancreatic or peripancreatic fluid collections or inflammatory changes. Spleen: Unremarkable. Adrenals/Urinary Tract: Bilateral kidneys and adrenal glands are normal in appearance. No hydroureteronephrosis. Urinary bladder is normal in appearance. Stomach/Bowel: The appearance of the stomach is normal. No pathologic dilatation of small bowel or colon. Normal appendix. Vascular/Lymphatic: Aortic atherosclerosis, without evidence of aneurysm or dissection in the abdominal or pelvic vasculature. Complete occlusion of the left common iliac, left internal iliac and left external iliac arteries, with distal reconstitution of flow from collateral pathways, similar to prior examinations. Retroaortic left renal vein (normal anatomical variant) incidentally noted. No lymphadenopathy noted in the abdomen or pelvis. Reproductive: Prostate gland and seminal vesicles are unremarkable in appearance. Other: No significant volume of ascites.  No pneumoperitoneum. Musculoskeletal: There are no aggressive appearing lytic or blastic lesions noted in the visualized portions of the skeleton. IMPRESSION: 1. Stable treatment related changes of postradiation mass-like fibrosis in the medial aspect of the right lung base, with no definitive findings to suggest locally recurrent disease or new metastatic disease in the chest, abdomen or pelvis. 2. Small amount of pericardial fluid and/or  thickening, similar to the prior study. 3. Aortic atherosclerosis, in addition to left main and 2 vessel coronary artery disease. Chronic occlusion of the left common iliac, internal iliac and external iliac arteries with distal reconstitution of flow from collateral pathways, similar to prior studies. 4. Cholelithiasis without evidence of acute cholecystitis at this time. 5. Additional incidental findings, as above. Electronically Signed   By: Daniel  Entrikin M.D.   On: 03/07/2021 12:35     ASSESSMENT AND PLAN: This is a very pleasant 60 years old white male with a stage IV (t3, N0, M1c) non-small cell lung cancer, adenocarcinoma with MSI high presented with right lower lobe/infrahilar mass in addition to solitary brain metastasis in the left cerebellum diagnosed in July 2021. He is status post SRS to the solitary brain metastasis. The patient completed a course of concurrent chemoradiation with weekly carboplatin and paclitaxel.  He tolerated the treatment well except for fatigue and mild odynophagia. The patient has MSI high and I recommended for him treatment with immunotherapy with single agent Keytruda 200 mg IV every 3 weeks for a total of 2 years unless the patient has unacceptable toxicity or disease progression. He is status post 17 cycles of treatment with Keytruda.  He also received 7 cycles of Avastin for the vasogenic edema in the brain.   Avastin will be on hold for now unless needed in the future. Has been tolerating this treatment well with no concerning adverse effects. I recommended for the patient to proceed with cycle #19 today as planned. I will   see him back for follow-up visit in 3 weeks for evaluation before the next cycle of his treatment. He had MRI of the brain performed recently that showed single new punctate lesion in the left parietal cortex.  He has an appointment with Dr. Vaslow for evaluation and discussion of this finding and recommendation regarding his  treatment. For the hypertension he was advised to monitor his blood pressure closely at home. The patient was advised to call immediately if he has any other concerning symptoms in the interval.  The patient voices understanding of current disease status and treatment options and is in agreement with the current care plan.  All questions were answered. The patient knows to call the clinic with any problems, questions or concerns. We can certainly see the patient much sooner if necessary.  Disclaimer: This note was dictated with voice recognition software. Similar sounding words can inadvertently be transcribed and may not be corrected upon review.       

## 2021-03-30 NOTE — Patient Instructions (Signed)
[facility]  Discharge Instructions: ?Thank you for choosing [facility] to provide your oncology and hematology care.  ? ?If you have a lab appointment with the Cancer Center, please go directly to the Cancer Center and check in at the registration area. ?  ?Wear comfortable clothing and clothing appropriate for easy access to any Portacath or PICC line.  ? ?We strive to give you quality time with your provider. You may need to reschedule your appointment if you arrive late (15 or more minutes).  Arriving late affects you and other patients whose appointments are after yours.  Also, if you miss three or more appointments without notifying the office, you may be dismissed from the clinic at the provider?s discretion.    ?  ?For prescription refill requests, have your pharmacy contact our office and allow 72 hours for refills to be completed.   ? ?Today you received the following chemotherapy and/or immunotherapy agents: Keytruda ?  ?To help prevent nausea and vomiting after your treatment, we encourage you to take your nausea medication as directed. ? ?BELOW ARE SYMPTOMS THAT SHOULD BE REPORTED IMMEDIATELY: ?*FEVER GREATER THAN 100.4 F (38 ?C) OR HIGHER ?*CHILLS OR SWEATING ?*NAUSEA AND VOMITING THAT IS NOT CONTROLLED WITH YOUR NAUSEA MEDICATION ?*UNUSUAL SHORTNESS OF BREATH ?*UNUSUAL BRUISING OR BLEEDING ?*URINARY PROBLEMS (pain or burning when urinating, or frequent urination) ?*BOWEL PROBLEMS (unusual diarrhea, constipation, pain near the anus) ?TENDERNESS IN MOUTH AND THROAT WITH OR WITHOUT PRESENCE OF ULCERS (sore throat, sores in mouth, or a toothache) ?UNUSUAL RASH, SWELLING OR PAIN  ?UNUSUAL VAGINAL DISCHARGE OR ITCHING  ? ?Items with * indicate a potential emergency and should be followed up as soon as possible or go to the Emergency Department if any problems should occur. ? ?Please show the CHEMOTHERAPY ALERT CARD or IMMUNOTHERAPY ALERT CARD at check-in to the  Emergency Department and triage nurse. ? ?Should you have questions after your visit or need to cancel or reschedule your appointment, please contact [facility]  Dept: [phone]  and follow the prompts.  Office hours are 8:00 a.m. to 4:30 p.m. Monday - Friday. Please note that voicemails left after 4:00 p.m. may not be returned until the following business day.  We are closed weekends and major holidays. You have access to a nurse at all times for urgent questions. Please call the main number to the clinic Dept: [phone] and follow the prompts. ? ? ?For any non-urgent questions, you may also contact your provider using MyChart. We now offer e-Visits for anyone 18 and older to request care online for non-urgent symptoms. For details visit [email or web address]. ?  ?Also download the MyChart app! Go to the app store, search "MyChart", open the app, select [facility], and log in with your MyChart username and password. ? ?Due to Covid, a mask is required upon entering the hospital/clinic. If you do not have a mask, one will be given to you upon arrival. For doctor visits, patients may have 1 support person aged 18 or older with them. For treatment visits, patients cannot have anyone with them due to current Covid guidelines and our immunocompromised population.  ? ?

## 2021-04-01 ENCOUNTER — Encounter: Payer: Self-pay | Admitting: General Practice

## 2021-04-01 NOTE — Progress Notes (Signed)
[facility] CSW Progress Notes  Call to patient per referral.  States he is doing well overall, no pressing concerns.  Is on disability so has income source.  He has not heard about appointment w Dr Mickeal Skinner - messaged his team w this concern. Also advised him about availability of resources from [employer] [facility] [place] and [place].  He can apply for help from these agencies if desired/needed.  Edwyna Shell, LCSW Clinical Social Worker Phone:  [phone]

## 2021-04-02 ENCOUNTER — Telehealth: Payer: Self-pay | Admitting: Internal Medicine

## 2021-04-02 NOTE — Telephone Encounter (Signed)
Scheduled per sch msg. Called and spoke with patient. Confirmed appt  

## 2021-04-09 ENCOUNTER — Telehealth: Payer: Self-pay | Admitting: *Deleted

## 2021-04-09 NOTE — Telephone Encounter (Signed)
Connected with [name] [phone]) regarding e-mail.  No form received per tracking sheet.  Mont Dutton [email or web address]> Thu 04/08/2021 3:02 PM To: Good afternoon and Merry Christmas!!!! Mr. Amsler left voicemail requesting a call back regarding his paperwork that has not yet been received. Will you please call him back? I am not familiar with this process or what he is needing.  "I left a [employer] disability form a month ago with a tall thin nurse with Dr. Julien Nordmann.  They keep calling me wanting the form returned."  Advised to provide [employer] [facility] fax number [phone] and we will begin processing as soon as received.

## 2021-04-10 ENCOUNTER — Other Ambulatory Visit: Payer: Self-pay

## 2021-04-13 ENCOUNTER — Inpatient Hospital Stay (HOSPITAL_BASED_OUTPATIENT_CLINIC_OR_DEPARTMENT_OTHER): Payer: Managed Care, Other (non HMO) | Admitting: Internal Medicine

## 2021-04-13 ENCOUNTER — Other Ambulatory Visit: Payer: Self-pay

## 2021-04-13 VITALS — BP 160/93 | HR 90 | Temp 97.8°F | Resp 18 | Ht 68.0 in | Wt 172.8 lb

## 2021-04-13 DIAGNOSIS — C7931 Secondary malignant neoplasm of brain: Secondary | ICD-10-CM

## 2021-04-13 DIAGNOSIS — C3431 Malignant neoplasm of lower lobe, right bronchus or lung: Secondary | ICD-10-CM | POA: Diagnosis not present

## 2021-04-13 MED ORDER — ASPIRIN EC 81 MG PO TBEC: 81.0000 mg | DELAYED_RELEASE_TABLET | Freq: Every day | ORAL | 11 refills | Status: DC

## 2021-04-13 MED ORDER — AMLODIPINE BESYLATE 5 MG PO TABS: 5.0000 mg | ORAL_TABLET | Freq: Every day | ORAL | 2 refills | Status: DC

## 2021-04-13 NOTE — Progress Notes (Signed)
[place] [facility] [place], [place] [address] [phone]   Interval Evaluation  Date of Service: 04/13/21 Patient Name: Joseph Atkins Patient MRN: [id number] Patient DOB: [birth year] Provider: Ventura Sellers, MD  Identifying Statement:  Joseph Atkins is a 60 y.o. male with Brain metastasis ([place]) [C79.31]   Primary Cancer:  Oncologic History: Oncology History  Adenocarcinoma of right lung, stage 4 ([place])  11/14/2019 Initial Diagnosis   Adenocarcinoma of right lung, stage 4 ([place])   11/25/2019 - 01/06/2020 Chemotherapy   The patient had palonosetron (ALOXI) injection 0.25 mg, 0.25 mg, Intravenous,  Once, 7 of 7 cycles Administration: 0.25 mg (11/25/2019), 0.25 mg (12/23/2019), 0.25 mg (12/31/2019), 0.25 mg (12/02/2019), 0.25 mg (01/06/2020), 0.25 mg (12/09/2019), 0.25 mg (12/16/2019) CARBOplatin (PARAPLATIN) 250 mg in sodium chloride 0.9 % 250 mL chemo infusion, 249.8 mg (100 % of original dose 249.8 mg), Intravenous,  Once, 7 of 7 cycles Dose modification: 249.8 mg (original dose 249.8 mg, Cycle 1) Administration: 250 mg (11/25/2019), 250 mg (12/23/2019), 250 mg (12/31/2019), 250 mg (12/02/2019), 250 mg (01/06/2020), 270 mg (12/09/2019), 250 mg (12/16/2019) PACLitaxel (TAXOL) 90 mg in sodium chloride 0.9 % 250 mL chemo infusion (</= 80mg /m2), 45 mg/m2 = 90 mg, Intravenous,  Once, 7 of 7 cycles Administration: 90 mg (11/25/2019), 90 mg (12/23/2019), 90 mg (12/31/2019), 90 mg (12/02/2019), 90 mg (01/06/2020), 90 mg (12/09/2019), 90 mg (12/16/2019)   for chemotherapy treatment.     02/18/2020 -  Chemotherapy   Patient is on Treatment Plan : LUNG NSCLC flat dose Pembrolizumab Q21D     03/31/2020 - 07/21/2020 Chemotherapy    Patient is on Treatment Plan: LUNG NSCLC FLAT DOSE PEMBROLIZUMAB Q21D       05/19/2020 Cancer Staging   Staging form: Lung, AJCC 8th Edition - Clinical: Stage IVB (cT3, cN0, cM1c) - Signed by Curt Bears, MD on 05/19/2020     CNS  Oncologic History 11/14/19: Completes single frx SRS to 2.4cm cerebellar metastasis 03/05/20: SRS to 7 targets Lisbeth Renshaw) 06/19/20: SRS to 6 additional targets Lisbeth Renshaw)    Interval History:  Joseph Atkins presents today following recent MRI brain.  He describes some recurrence of headaches today.  They can affect any part of his head, last for hours, and are associated with photophobia. No longer dosing sterods.  He is walking independently currently.  He does complain of ongoing fatigue and low energy, not worse from prior. Continues on Keytruda therapy.  H+P (01/24/20) Patient presents today to discuss recent recurrence of neurologic symptoms following radiosurgery in July 2021.  He describes poor balance, wide based walking, needing to hold on to family or objects to walk safely.  He also describes nausea and dizzy-headed feeling at times.  Overall he is functioning in an impaired and sluggish way.  Symptoms have become noticeable since decreasing decadron to less than 4mg  daily; currently he is dosing 2mg  daily.  He felt "quite good" immediately after radiation and during higher dose steroid therapy.  No complications from decadron that he can report.  Recently completed chemoradioatherapy induction for lung adenocarcinoma with Dr. Julien Nordmann.  Medications: Current Outpatient Medications on File Prior to Visit  Medication Sig Dispense Refill   albuterol (PROVENTIL HFA;VENTOLIN HFA) 108 (90 Base) MCG/ACT inhaler Inhale 2 puffs into the lungs every 6 (six) hours as needed for wheezing or shortness of breath. 1 Inhaler 0   ANORO ELLIPTA 62.5-25 MCG/INH AEPB 1 puff daily.     cyclobenzaprine (FLEXERIL)  10 MG tablet Take 10 mg by mouth 2 (two) times daily as needed for muscle spasms.     docusate sodium (COLACE) 100 MG capsule Take 100 mg by mouth daily.     DULoxetine (CYMBALTA) 20 MG capsule Take 1 capsule (20 mg total) by mouth 2 (two) times daily. 60 capsule 5   fluticasone furoate-vilanterol (BREO  ELLIPTA) 100-25 MCG/INH AEPB Inhale 1 puff into the lungs daily. 30 each 3   levothyroxine (SYNTHROID) 125 MCG tablet Take 1 tablet (125 mcg total) by mouth daily. 30 tablet 1   lidocaine-prilocaine (EMLA) cream Apply 1 application topically as needed. 30 g 0   omeprazole (PRILOSEC) 40 MG capsule Take 1 capsule (40 mg total) by mouth daily. 90 capsule 4   omeprazole (PRILOSEC) 40 MG capsule 1 capsule     ondansetron (ZOFRAN) 4 MG tablet Take 1 tablet (4 mg total) by mouth every 8 (eight) hours as needed. 40 tablet 2   oxyCODONE-acetaminophen (PERCOCET/ROXICET) 5-325 MG tablet Take 1 tablet by mouth every 4 (four) hours as needed for severe pain. 30 tablet 0   pembrolizumab (KEYTRUDA) 100 MG/4ML SOLN See admin instructions.     polyethylene glycol powder (MIRALAX) powder Take 17 g by mouth daily. 255 g 11   prochlorperazine (COMPAZINE) 10 MG tablet Take 1 tablet (10 mg total) by mouth every 6 (six) hours as needed. 30 tablet 2   TRULANCE 3 MG TABS Take 1 tablet by mouth daily.     zolpidem (AMBIEN CR) 12.5 MG CR tablet Take 1 tablet (12.5 mg total) by mouth at bedtime as needed for sleep. 30 tablet 5   No current facility-administered medications on file prior to visit.    Allergies:  Allergies  Allergen Reactions   Penicillins Other (See Comments)    Childhood allergy.  Has patient had a PCN reaction causing immediate rash, facial/tongue/throat swelling, SOB or lightheadedness with hypotension: unknown Has patient had a PCN reaction causing severe rash involving mucus membranes or skin necrosis: unknown Has patient had a PCN reaction that required hospitalization: unknown Has patient had a PCN reaction occurring within the last 10 years: no If all of the above answers are "NO", then may proceed with Cephalosporin use.    Past Medical History:  Past Medical History:  Diagnosis Date   Cancer ([place])    lung cancer   Diverticulosis    GERD (gastroesophageal reflux disease)    Hx of  small bowel obstruction    Hyperlipidemia    Hypertension    Hypothyroidism    IBS (irritable bowel syndrome)    Substance abuse ([facility])    Alcoholic, Drug addition   Thyroid disease    Past Surgical History:  Past Surgical History:  Procedure Laterality Date   arm surgery Right    BRONCHIAL BRUSHINGS  10/24/2019   Procedure: BRONCHIAL BRUSHINGS;  Surgeon: Collene Gobble, MD;  Location: [place];  Service: Cardiopulmonary;;  right lower lobe    BRONCHIAL BRUSHINGS  11/05/2019   Procedure: BRONCHIAL BRUSHINGS;  Surgeon: Collene Gobble, MD;  Location: [place];  Service: Pulmonary;;   BRONCHIAL NEEDLE ASPIRATION BIOPSY  10/24/2019   Procedure: BRONCHIAL NEEDLE ASPIRATION BIOPSIES;  Surgeon: Collene Gobble, MD;  Location: [place];  Service: Cardiopulmonary;;   BRONCHIAL NEEDLE ASPIRATION BIOPSY  11/05/2019   Procedure: BRONCHIAL NEEDLE ASPIRATION BIOPSIES;  Surgeon: Collene Gobble, MD;  Location: [place];  Service: Pulmonary;;   ENDOBRONCHIAL ULTRASOUND N/A 10/24/2019   Procedure: ENDOBRONCHIAL ULTRASOUND;  Surgeon:  Collene Gobble, MD;  Location: [facility] ENDOSCOPY;  Service: Cardiopulmonary;  Laterality: N/A;   FINGER SURGERY Right    Middle   IR IMAGING GUIDED PORT INSERTION  11/19/2019   VIDEO BRONCHOSCOPY N/A 10/24/2019   Procedure: VIDEO BRONCHOSCOPY WITHOUT FLUORO;  Surgeon: Collene Gobble, MD;  Location: [place];  Service: Cardiopulmonary;  Laterality: N/A;   VIDEO BRONCHOSCOPY WITH ENDOBRONCHIAL NAVIGATION N/A 11/05/2019   Procedure: VIDEO BRONCHOSCOPY WITH ENDOBRONCHIAL NAVIGATION;  Surgeon: Collene Gobble, MD;  Location: [place] ENDOSCOPY;  Service: Pulmonary;  Laterality: N/A;   Social History:  Social History   Socioeconomic History   Marital status: Married    Spouse name: Not on file   Number of children: 2   Years of education: Not on file   Highest education level: Not on file  Occupational History   Occupation: supervisor    Employer: [employer]   Tobacco Use   Smoking status: Former    Packs/day: 1.50    Types: Cigarettes   Smokeless tobacco: Former    Types: Nurse, children's Use: Never used  Substance and Sexual Activity   Alcohol use: No    Comment: Alcoholic clean for 6 years   Drug use: No    Comment: Recovering Drug Addict-clean for 6 years   Sexual activity: Not on file  Other Topics Concern   Not on file  Social History Narrative   Not on file   Social Determinants of Health   Financial Resource Strain: Not on file  Food Insecurity: Not on file  Transportation Needs: Not on file  Physical Activity: Not on file  Stress: Not on file  Social Connections: Not on file  Intimate Partner Violence: Not on file   Family History:  Family History  Problem Relation Age of Onset   Epilepsy Mother    Heart disease Mother    Heart disease Brother    Lung cancer Paternal Uncle     Review of Systems: Constitutional: Doesn't report fevers, chills or abnormal weight loss Eyes: Doesn't report blurriness of vision Ears, nose, mouth, throat, and face: Doesn't report sore throat Respiratory: Doesn't report cough, dyspnea or wheezes Cardiovascular: Doesn't report palpitation, chest discomfort  Gastrointestinal:  Doesn't report nausea, constipation, diarrhea GU: Doesn't report incontinence Skin: Doesn't report skin rashes Neurological: Per HPI Musculoskeletal: Doesn't report joint pain Behavioral/Psych: Doesn't report anxiety  Physical Exam: Vitals:   04/13/21 1058  BP: (!) 160/93  Pulse: 90  Resp: 18  Temp: 97.8 F (36.6 C)  SpO2: 99%    KPS: 70. General: Alert, cooperative, pleasant, in no acute distress Head: Normal EENT: No conjunctival injection or scleral icterus.  Lungs: Resp effort normal Cardiac: Regular rate Abdomen: Non-distended abdomen Skin: No rashes cyanosis or petechiae. Extremities: No clubbing or edema  Neurologic Exam: Mental Status: Awake, alert, attentive to examiner.  Oriented to self and environment. Language is fluent with intact comprehension.  Cranial Nerves: Visual acuity is grossly normal. Visual fields are full. Extra-ocular movements intact. No ptosis. Face is symmetric Motor: Tone and bulk are normal. Power is full in both arms and legs. Reflexes are symmetric, no pathologic reflexes present.  Dysmetria L>R Sensory: Intact to light touch Gait: Dystaxic, wide based   Labs: I have reviewed the data as listed    Component Value Date/Time   NA 137 03/30/2021 0902   NA 141 03/08/2017 1707   K 3.7 03/30/2021 0902   CL 105 03/30/2021 0902   CO2 22  03/30/2021 0902   GLUCOSE 157 (H) 03/30/2021 0902   BUN 7 03/30/2021 0902   BUN 12 03/08/2017 1707   CREATININE 0.91 03/30/2021 0902   CALCIUM 8.8 (L) 03/30/2021 0902   PROT 6.7 03/30/2021 0902   PROT 7.1 03/08/2017 1707   ALBUMIN 3.8 03/30/2021 0902   ALBUMIN 4.5 03/08/2017 1707   AST 18 03/30/2021 0902   ALT 16 03/30/2021 0902   ALKPHOS 81 03/30/2021 0902   BILITOT 0.6 03/30/2021 0902   GFRNONAA >60 03/30/2021 0902   GFRAA >60 01/06/2020 0837   Lab Results  Component Value Date   WBC 6.9 03/30/2021   NEUTROABS 4.7 03/30/2021   HGB 12.7 (L) 03/30/2021   HCT 38.8 (L) 03/30/2021   MCV 90.4 03/30/2021   PLT 223 03/30/2021    Imaging:  [place] Clinician Interpretation: I have personally reviewed the CNS images as listed.  My interpretation, in the context of the patient's clinical presentation, is treatment effect vs true progression  MR Brain W Wo Contrast  Result Date: 03/27/2021 CLINICAL DATA:  History of lung and brain cancer. Surveillance imaging EXAM: MRI HEAD WITHOUT AND WITH CONTRAST TECHNIQUE: Multiplanar, multiecho pulse sequences of the brain and surrounding structures were obtained without and with intravenous contrast. CONTRAST:  64mL MULTIHANCE GADOBENATE DIMEGLUMINE 529 MG/ML IV SOLN COMPARISON:  12/11/2020 FINDINGS: BRAIN New Lesions: Punctate nodular enhancement along the left  parietal cortex on 11:113. This was not seen on prior or even more remote imaging. Larger lesions: None. Stable or Smaller lesions: Partially mineralized, peripherally enhancing left cerebellar lesion measuring 2.8 cm, unchanged in size although there is new mild vasogenic edema. Punctate left paramedian occipital lobe nodule on 11:62 is unchanged 6 mm nodule at the high left frontal parietal junction on 11:134 4 mm posterior right frontal cortex nodule on 11:120 Other Brain findings: Band of T2 hyperintensities newly seen/increase in the right thalamus, consistent with interval infarct. Diffuse chronic small vessel ischemia. No acute hemorrhage, hydrocephalus, or shift. Vascular: Major vessels are enhancing Skull and upper cervical spine: Normal marrow signal Sinuses/Orbits: Negative IMPRESSION: 1. Single new punctate lesion in the left parietal cortex on 11:113. 2. Pre-existing metastatic disease is unchanged, although there is new vasogenic edema associated with the largest lesion located in the left cerebellum. 3. Interval perforator infarct in the right thalamus. Electronically Signed   By: Jorje Guild M.D.   On: 03/27/2021 08:00     Assessment/Plan Brain metastasis ([place]) [C79.31]  MARVEN VELEY is clinically stable following higher dose dexamethasone therapy.  Breakthrough headaches may be secondary to sudden withdrawal of caffeine, need for updated glasses prescription.  Brain MRI demonstrates one new lesion, which was previously treated and had resolved on recent studies.  We will keep a close eye on this, but suspect treatment effect.  Additionally, a suspected infarct was identified in the right thalamus, although DWI signal abnormality was not seen.  We counseled him on secondary stroke prevention extensively.  We recommended initiating Aspirin 81mg  daily, as well as Norvasc 5mg  daily for elevated blood pressure in office today.  He will visit with his primary care to adjust HTN regimen  from here on.  Ok with PRN tylenol for headaches.  Otherwise we ask that [name] return to clinic in 3 months following next brain MRI, or sooner as needed.  We appreciate the opportunity to participate in the care of Joseph Atkins.   All questions were answered. The patient knows to call the clinic with any problems,  questions or concerns. No barriers to learning were detected.  The total time spent in the encounter was 40 minutes and more than 50% was on counseling and review of test results   Ventura Sellers, MD Medical Director of Neuro-Oncology [facility] [place] 04/13/21 10:42 AM

## 2021-04-13 NOTE — Progress Notes (Signed)
[facility] OFFICE PROGRESS NOTE  [name], NP [address] [name] [address] [place] [address]  DIAGNOSIS: Stage IV carcinoma, non-small cell lung cancer, adenocarcinoma.  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 He was diagnosed in July 2021.   Molecular Biomarkers:  MSI-High DETECTED Pembrolizumab Atezolizumab, Avelumab, Cemiplimab, Dostarlimab, Durvalumab, Ipilimumab, Nivolumab   STK11Splice Site SNV 2.8% Everolimus, Temsirolimus Yes   KRASG12D 1.7% Binimetinib Yes   [id number] 0.4%   Niraparib, Olaparib, Rucaparib, Talazoparib, Tazemetostat Yes  PRIOR THERAPY: 1) SRS to the solitary brain metastasis under the care of Dr. Lisbeth Renshaw. Last treatment 7/22/212)  2) Weekly concurrent chemoradiation with carboplatin for an AUC of 2, paclitaxel 45 mg/m2.  First dose expected on 11/25/2019. Status post 7 cycles, last dose was giving 01/06/2020 with partial response.   CURRENT THERAPY: 1)  Immunotherapy with Keytruda 200 mg IV every 3 weeks.  First dose February 10, 2020 for a patient with MSI high.  Status post 19 cycles. 2) Avastin 15 mg/KG every 3 weeks.  For the vasogenic edema of the brain.S/P 7 cycles.    INTERVAL HISTORY: Joseph Atkins 60 y.o. male returns to the clinic today for a follow-up visit.  The patient is feeling fairly well today without any concerning complaints. The patient is tolerating single agent immunotherapy with Keytruda well without any concerning adverse side effects except for itching and some small scattered skin lesions.  He has a plaque on his left forearm.  He saw his primary care provider recently who gave him a steroid cream.  The patient has not picked this up yet.  He also saw his primary care provider about hypertension.  The patient notes he has had improved blood pressure recently and he has noticed a decrease in headaches due to this.   Today, the patient denies any fever, chills, night sweats, or weight loss.  He reports stable dyspnea on exertion.  Denies significant cough.  He denies any or hemoptysis.  He denies any nausea, vomiting, constipation or diarrhea.e follows closely with neuro oncology for his history of metastatic disease to the brain.  He recently had a follow-up appointment with Dr. Mickeal Skinner on 04/13/2021. The patient is here today for evaluation before starting cycle #20.    MEDICAL HISTORY: Past Medical History:  Diagnosis Date   Cancer ([facility])    lung cancer   Diverticulosis    GERD (gastroesophageal reflux disease)    Hx of small bowel obstruction    Hyperlipidemia    Hypertension    Hypothyroidism    IBS (irritable bowel syndrome)    Substance abuse ([facility])    Alcoholic, Drug addition   Thyroid disease     ALLERGIES:  is allergic to penicillins.  MEDICATIONS:  Current Outpatient Medications  Medication Sig Dispense Refill   albuterol (PROVENTIL HFA;VENTOLIN HFA) 108 (90 Base) MCG/ACT inhaler Inhale 2 puffs into the lungs every 6 (six) hours as needed for wheezing or shortness of breath. 1 Inhaler 0   amLODipine (NORVASC) 5 MG tablet Take 1 tablet (5 mg total) by mouth daily. 30 tablet 2   ANORO ELLIPTA 62.5-25 MCG/INH AEPB 1 puff daily.     aspirin EC 81 MG tablet Take 1 tablet (81 mg total) by mouth daily. Swallow whole. 30 tablet 11   cyclobenzaprine (FLEXERIL) 10 MG tablet Take 10 mg by mouth 2 (two) times daily as needed for muscle spasms.  docusate sodium (COLACE) 100 MG capsule Take 100 mg by mouth daily.     DULoxetine (CYMBALTA) 20 MG capsule Take 1 capsule (20 mg total) by mouth 2 (two) times daily. 60 capsule 5   fluticasone furoate-vilanterol (BREO ELLIPTA) 100-25 MCG/INH AEPB Inhale 1 puff into the lungs daily. 30 each 3   levothyroxine (SYNTHROID) 125 MCG tablet Take 1 tablet (125 mcg total) by mouth daily. 30 tablet 1   lidocaine-prilocaine (EMLA) cream Apply 1 application topically  as needed. 30 g 0   omeprazole (PRILOSEC) 40 MG capsule Take 1 capsule (40 mg total) by mouth daily. 90 capsule 4   ondansetron (ZOFRAN) 4 MG tablet Take 1 tablet (4 mg total) by mouth every 8 (eight) hours as needed. 40 tablet 2   oxyCODONE-acetaminophen (PERCOCET/ROXICET) 5-325 MG tablet Take 1 tablet by mouth every 4 (four) hours as needed for severe pain. 30 tablet 0   pembrolizumab (KEYTRUDA) 100 MG/4ML SOLN See admin instructions.     polyethylene glycol powder (MIRALAX) powder Take 17 g by mouth daily. 255 g 11   prochlorperazine (COMPAZINE) 10 MG tablet Take 1 tablet (10 mg total) by mouth every 6 (six) hours as needed. 30 tablet 2   TRULANCE 3 MG TABS Take 1 tablet by mouth daily.     zolpidem (AMBIEN CR) 12.5 MG CR tablet Take 1 tablet (12.5 mg total) by mouth at bedtime as needed for sleep. 30 tablet 5   No current facility-administered medications for this visit.    SURGICAL HISTORY:  Past Surgical History:  Procedure Laterality Date   arm surgery Right    BRONCHIAL BRUSHINGS  10/24/2019   Procedure: BRONCHIAL BRUSHINGS;  Surgeon: Collene Gobble, MD;  Location: [facility] ENDOSCOPY;  Service: Cardiopulmonary;;  right lower lobe    BRONCHIAL BRUSHINGS  11/05/2019   Procedure: BRONCHIAL BRUSHINGS;  Surgeon: Collene Gobble, MD;  Location[facility] ENDOSCOPY;  Service: Pulmonary;;   BRONCHIAL NEEDLE ASPIRATION BIOPSY  10/24/2019   Procedure: BRONCHIAL NEEDLE ASPIRATION BIOPSIES;  Surgeon: Collene Gobble, MD;  Location: [facility];  Service: Cardiopulmonary;;   BRONCHIAL NEEDLE ASPIRATION BIOPSY  11/05/2019   Procedure: BRONCHIAL NEEDLE ASPIRATION BIOPSIES;  Surgeon: Collene Gobble, MD;  Location: [facility] ENDOSCOPY;  Service: Pulmonary;;   ENDOBRONCHIAL ULTRASOUND N/A 10/24/2019   Procedure: ENDOBRONCHIAL ULTRASOUND;  Surgeon: Collene Gobble, MD;  Location: [facility];  Service: Cardiopulmonary;  Laterality: N/A;   FINGER SURGERY Right    Middle   IR IMAGING GUIDED PORT INSERTION  11/19/2019    VIDEO BRONCHOSCOPY N/A 10/24/2019   Procedure: VIDEO BRONCHOSCOPY WITHOUT FLUORO;  Surgeon: Collene Gobble, MD;  Location: [facility];  Service: Cardiopulmonary;  Laterality: N/A;   VIDEO BRONCHOSCOPY WITH ENDOBRONCHIAL NAVIGATION N/A 11/05/2019   Procedure: VIDEO BRONCHOSCOPY WITH ENDOBRONCHIAL NAVIGATION;  Surgeon: Collene Gobble, MD;  Location: [facility] ENDOSCOPY;  Service: Pulmonary;  Laterality: N/A;    REVIEW OF SYSTEMS:   Review of Systems  Constitutional: Negative for appetite change, chills, fatigue, fever and unexpected weight change.  HENT: Negative for mouth sores, nosebleeds, sore throat and trouble swallowing.   Eyes: Negative for eye problems and icterus.  Respiratory: Positive for baseline dyspnea on exertion.  Negative for cough, hemoptysis, and wheezing.   Cardiovascular: Negative for chest pain and leg swelling.  Gastrointestinal: Negative for abdominal pain, constipation, diarrhea, nausea and vomiting.  Genitourinary: Negative for bladder incontinence, difficulty urinating, dysuria, frequency and hematuria.   Musculoskeletal: Negative for back pain, gait problem, neck pain and neck stiffness.  Skin: Positive for dry scattered skin lesions. Neurological: Negative for dizziness, extremity weakness, gait problem, headaches, light-headedness and seizures.  Hematological: Negative for adenopathy. Does not bruise/bleed easily.  Psychiatric/Behavioral: Negative for confusion, depression and sleep disturbance. The patient is not nervous/anxious.     PHYSICAL EXAMINATION:  Blood pressure 108/84, pulse (!) 103, temperature (!) 97.4 F (36.3 C), temperature source Tympanic, resp. rate 18, height 5' 8" (1.727 m), weight 174 lb 4.8 oz (79.1 kg), SpO2 99 %.  ECOG PERFORMANCE STATUS: 1  Physical Exam  Constitutional: Oriented to person, place, and time and well-developed, well-nourished, and in no distress. HENT:  Head: Normocephalic and atraumatic.  Mouth/Throat: Oropharynx is clear  and moist. No oropharyngeal exudate.  Eyes: Conjunctivae are normal. Right eye exhibits no discharge. Left eye exhibits no discharge. No scleral icterus.  Neck: Normal range of motion. Neck supple.  Cardiovascular: Normal rate, regular rhythm, normal heart sounds and intact distal pulses.   Pulmonary/Chest: Effort normal and breath sounds normal. No respiratory distress. No wheezes. No rales.  Abdominal: Soft. Bowel sounds are normal. Exhibits no distension and no mass. There is no tenderness.  Musculoskeletal: Normal range of motion. Exhibits no edema.  Lymphadenopathy:    No cervical adenopathy.  Neurological: Alert and oriented to person, place, and time. Exhibits normal muscle tone. Gait normal. Coordination normal.  Skin: Positive for immunotherapy mediated skin rash.  Skin is warm and dry. Not diaphoretic. No erythema. No pallor.  Psychiatric: Mood, memory and judgment normal.  Vitals reviewed.  LABORATORY DATA: Lab Results  Component Value Date   WBC 7.7 04/20/2021   HGB 12.7 (L) 04/20/2021   HCT 38.5 (L) 04/20/2021   MCV 89.1 04/20/2021   PLT 231 04/20/2021      Chemistry      Component Value Date/Time   NA 137 03/30/2021 0902   NA 141 03/08/2017 1707   K 3.7 03/30/2021 0902   CL 105 03/30/2021 0902   CO2 22 03/30/2021 0902   BUN 7 03/30/2021 0902   BUN 12 03/08/2017 1707   CREATININE 0.91 03/30/2021 0902      Component Value Date/Time   CALCIUM 8.8 (L) 03/30/2021 0902   ALKPHOS 81 03/30/2021 0902   AST 18 03/30/2021 0902   ALT 16 03/30/2021 0902   BILITOT 0.6 03/30/2021 0902       RADIOGRAPHIC STUDIES:  MR Brain W Wo Contrast  Result Date: 03/27/2021 CLINICAL DATA:  History of lung and brain cancer. Surveillance imaging EXAM: MRI HEAD WITHOUT AND WITH CONTRAST TECHNIQUE: Multiplanar, multiecho pulse sequences of the brain and surrounding structures were obtained without and with intravenous contrast. CONTRAST:  32m MULTIHANCE GADOBENATE DIMEGLUMINE 529  MG/ML IV SOLN COMPARISON:  12/11/2020 FINDINGS: BRAIN New Lesions: Punctate nodular enhancement along the left parietal cortex on 11:113. This was not seen on prior or even more remote imaging. Larger lesions: None. Stable or Smaller lesions: Partially mineralized, peripherally enhancing left cerebellar lesion measuring 2.8 cm, unchanged in size although there is new mild vasogenic edema. Punctate left paramedian occipital lobe nodule on 11:62 is unchanged 6 mm nodule at the high left frontal parietal junction on 11:134 4 mm posterior right frontal cortex nodule on 11:120 Other Brain findings: Band of T2 hyperintensities newly seen/increase in the right thalamus, consistent with interval infarct. Diffuse chronic small vessel ischemia. No acute hemorrhage, hydrocephalus, or shift. Vascular: Major vessels are enhancing Skull and upper cervical spine: Normal marrow signal Sinuses/Orbits: Negative IMPRESSION: 1. Single new punctate lesion in the left parietal  cortex on 11:113. 2. Pre-existing metastatic disease is unchanged, although there is new vasogenic edema associated with the largest lesion located in the left cerebellum. 3. Interval perforator infarct in the right thalamus. Electronically Signed   By: Jorje Guild M.D.   On: 03/27/2021 08:00     ASSESSMENT/PLAN:  This is a very pleasant 60 year old Caucasian male diagnosed with stage IV non-small cell lung cancer, adenocarcinoma.  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 He was diagnosed in July 2021. His molecular studies by [facility] show he has MSI high which is a good marker for response to immunotherapy which will be important for future treatment.    The patient will complete SRS to the solidary brain metastasis under the care of Dr. Lisbeth Renshaw later today on 11/14/19.    He then underwent a course of weekly concurrent chemoradiation with carboplatin for an AUC of 2  and paclitaxel 45 mg per metered squared.  He tolerated this treatment well except for fatigue and mild odynophagia.  The patient showed evidence of disease progression with an increase in volume of the right infrahilar mass.  Since the patient has MSI high, the patient then underwent treatment with single agent immunotherapy with Keytruda 200 mg IV every 3 weeks.  The plan is to complete a total of 2 years unless the patient has unacceptable toxicity or disease progression.  The patient is status post 19 cycles.  He also received 7 cycles of Avastin for vasogenic edema in the brain.  Avastin has been on hold unless needed again in the future.   Labs reviewed.  Recommend that he proceed with cycle #20 today scheduled.   We will see him back for follow-up visit in 3 weeks for evaluation before starting cycle #21.  He will try using the steroid cream given to him by his primary care provider for his dry skin lesions.  If no improvement, advised to call us for further recommendations.  Also advised he can use an antihistamine for itching as well.  Cautioned him that certain antihistamines such as Benadryl may cause him to feel drowsy.  The patient turned in paperwork for disability.  We will get this to the managed care office today.  The patient was advised to call immediately if she has any concerning symptoms in the interval. The patient voices understanding of current disease status and treatment options and is in agreement with the current care plan. All questions were answered. The patient knows to call the clinic with any problems, questions or concerns. We can certainly see the patient much sooner if necessary     No orders of the defined types were placed in this encounter.    The total time spent in the appointment was 20-29 minutes  Cassandra L Heilingoetter, PA-C 04/20/21

## 2021-04-14 ENCOUNTER — Telehealth: Payer: Self-pay | Admitting: Internal Medicine

## 2021-04-14 NOTE — Telephone Encounter (Signed)
Scheduled per 12/20 los, message has been left with pt

## 2021-04-15 ENCOUNTER — Other Ambulatory Visit: Payer: Self-pay | Admitting: Medical Oncology

## 2021-04-15 ENCOUNTER — Telehealth: Payer: Self-pay | Admitting: *Deleted

## 2021-04-15 DIAGNOSIS — Z5112 Encounter for antineoplastic immunotherapy: Secondary | ICD-10-CM

## 2021-04-15 DIAGNOSIS — C3491 Malignant neoplasm of unspecified part of right bronchus or lung: Secondary | ICD-10-CM

## 2021-04-15 NOTE — Telephone Encounter (Signed)
Connected with [employer]ONT[employer] VANDERHOOF [phone]) regarding 'Attending Provider Statement" for disability is completed and ready to return to [place].   Employee Supplementary Report of Disability section blank as this replacement faxed to office.  Initial copy had been completed and signed per patient report.   See 04/09/2021 phone note.    "Go ahead and fax what you have today.  Will complete employee section 04/20/2021 while there for you to resend."

## 2021-04-20 ENCOUNTER — Encounter: Payer: Self-pay | Admitting: Physician Assistant

## 2021-04-20 ENCOUNTER — Other Ambulatory Visit: Payer: Self-pay

## 2021-04-20 ENCOUNTER — Inpatient Hospital Stay: Payer: Managed Care, Other (non HMO)

## 2021-04-20 ENCOUNTER — Encounter: Payer: Self-pay | Admitting: Internal Medicine

## 2021-04-20 ENCOUNTER — Inpatient Hospital Stay (HOSPITAL_BASED_OUTPATIENT_CLINIC_OR_DEPARTMENT_OTHER): Payer: Managed Care, Other (non HMO) | Admitting: Physician Assistant

## 2021-04-20 VITALS — BP 108/84 | HR 103 | Temp 97.4°F | Resp 18 | Ht 68.0 in | Wt 174.3 lb

## 2021-04-20 VITALS — HR 98

## 2021-04-20 DIAGNOSIS — C3491 Malignant neoplasm of unspecified part of right bronchus or lung: Secondary | ICD-10-CM

## 2021-04-20 DIAGNOSIS — C3431 Malignant neoplasm of lower lobe, right bronchus or lung: Secondary | ICD-10-CM | POA: Diagnosis not present

## 2021-04-20 DIAGNOSIS — Z5112 Encounter for antineoplastic immunotherapy: Secondary | ICD-10-CM

## 2021-04-20 DIAGNOSIS — Z95828 Presence of other vascular implants and grafts: Secondary | ICD-10-CM

## 2021-04-20 LAB — CMP (CANCER CENTER ONLY)
ALT: 16 U/L (ref 0–44)
AST: 17 U/L (ref 15–41)
Albumin: 4 g/dL (ref 3.5–5.0)
Alkaline Phosphatase: 71 U/L (ref 38–126)
Anion gap: 7 (ref 5–15)
BUN: 11 mg/dL (ref 6–20)
CO2: 25 mmol/L (ref 22–32)
Calcium: 9.1 mg/dL (ref 8.9–10.3)
Chloride: 105 mmol/L (ref 98–111)
Creatinine: 0.85 mg/dL (ref 0.61–1.24)
GFR, Estimated: >60 mL/min (ref >60)
Glucose, Bld: 130 mg/dL — ABNORMAL HIGH (ref 70–99)
Potassium: 3.9 mmol/L (ref 3.5–5.1)
Sodium: 137 mmol/L (ref 135–145)
Total Bilirubin: 0.5 mg/dL (ref 0.3–1.2)
Total Protein: 6.9 g/dL (ref 6.5–8.1)

## 2021-04-20 LAB — CBC WITH DIFFERENTIAL (CANCER CENTER ONLY)
Abs Immature Granulocytes: 0.03 10*3/uL (ref 0.00–0.07)
Basophils Absolute: 0.1 10*3/uL (ref 0.0–0.1)
Basophils Relative: 1 %
Eosinophils Absolute: 0.2 10*3/uL (ref 0.0–0.5)
Eosinophils Relative: 3 %
HCT: 38.5 % — ABNORMAL LOW (ref 39.0–52.0)
Hemoglobin: 12.7 g/dL — ABNORMAL LOW (ref 13.0–17.0)
Immature Granulocytes: 0 %
Lymphocytes Relative: 15 %
Lymphs Abs: 1.2 10*3/uL (ref 0.7–4.0)
MCH: 29.4 pg (ref 26.0–34.0)
MCHC: 33 g/dL (ref 30.0–36.0)
MCV: 89.1 fL (ref 80.0–100.0)
Monocytes Absolute: 0.7 10*3/uL (ref 0.1–1.0)
Monocytes Relative: 10 %
Neutro Abs: 5.5 10*3/uL (ref 1.7–7.7)
Neutrophils Relative %: 71 %
Platelet Count: 231 10*3/uL (ref 150–400)
RBC: 4.32 MIL/uL (ref 4.22–5.81)
RDW: 12.5 % (ref 11.5–15.5)
WBC Count: 7.7 10*3/uL (ref 4.0–10.5)
nRBC: 0 % (ref 0.0–0.2)

## 2021-04-20 LAB — TSH: TSH: 2.156 u[IU]/mL (ref 0.320–4.118)

## 2021-04-20 MED ORDER — SODIUM CHLORIDE 0.9% FLUSH
10.0000 mL | Freq: Once | INTRAVENOUS | Status: AC
  Administered 2021-04-20: 08:00:00 10 mL

## 2021-04-20 MED ORDER — SODIUM CHLORIDE 0.9% FLUSH
10.0000 mL | INTRAVENOUS | Status: DC | PRN
  Administered 2021-04-20: 10:00:00 10 mL

## 2021-04-20 MED ORDER — SODIUM CHLORIDE 0.9 % IV SOLN
200.0000 mg | Freq: Once | INTRAVENOUS | Status: AC
  Administered 2021-04-20: 10:00:00 200 mg via INTRAVENOUS
  Filled 2021-04-20: qty 8

## 2021-04-20 MED ORDER — HEPARIN SOD (PORK) LOCK FLUSH 100 UNIT/ML IV SOLN
500.0000 [IU] | Freq: Once | INTRAVENOUS | Status: AC | PRN
  Administered 2021-04-20: 10:00:00 500 [IU]

## 2021-04-20 MED ORDER — SODIUM CHLORIDE 0.9 % IV SOLN: Freq: Once | INTRAVENOUS | Status: AC

## 2021-04-20 NOTE — Patient Instructions (Signed)
[facility] ONCOLOGY   Discharge Instructions: Thank you for choosing [facility] to provide your oncology and hematology care.   If you have a lab appointment with the [facility], please go directly to the [facility] and check in at the registration area.   Wear comfortable clothing and clothing appropriate for easy access to any Portacath or PICC line.   We strive to give you quality time with your provider. You may need to reschedule your appointment if you arrive late (15 or more minutes).  Arriving late affects you and other patients whose appointments are after yours.  Also, if you miss three or more appointments without notifying the office, you may be dismissed from the clinic at the providers discretion.      For prescription refill requests, have your pharmacy contact our office and allow 72 hours for refills to be completed.    Today you received the following chemotherapy and/or immunotherapy agents: pembrolizumab      To help prevent nausea and vomiting after your treatment, we encourage you to take your nausea medication as directed.  BELOW ARE SYMPTOMS THAT SHOULD BE REPORTED IMMEDIATELY: *FEVER GREATER THAN 100.4 F (38 C) OR HIGHER *CHILLS OR SWEATING *NAUSEA AND VOMITING THAT IS NOT CONTROLLED WITH YOUR NAUSEA MEDICATION *UNUSUAL SHORTNESS OF BREATH *UNUSUAL BRUISING OR BLEEDING *URINARY PROBLEMS (pain or burning when urinating, or frequent urination) *BOWEL PROBLEMS (unusual diarrhea, constipation, pain near the anus) TENDERNESS IN MOUTH AND THROAT WITH OR WITHOUT PRESENCE OF ULCERS (sore throat, sores in mouth, or a toothache) UNUSUAL RASH, SWELLING OR PAIN  UNUSUAL VAGINAL DISCHARGE OR ITCHING   Items with * indicate a potential emergency and should be followed up as soon as possible or go to the Emergency Department if any problems should occur.  Please show the CHEMOTHERAPY ALERT CARD or IMMUNOTHERAPY ALERT CARD at  check-in to the Emergency Department and triage nurse.  Should you have questions after your visit or need to cancel or reschedule your appointment, please contact [facility]  Dept: [phone]  and follow the prompts.  Office hours are 8:00 a.m. to 4:30 p.m. Monday - Friday. Please note that voicemails left after 4:00 p.m. may not be returned until the following business day.  We are closed weekends and major holidays. You have access to a nurse at all times for urgent questions. Please call the main number to the clinic Dept: [phone] and follow the prompts.   For any non-urgent questions, you may also contact your provider using MyChart. We now offer e-Visits for anyone 52 and older to request care online for non-urgent symptoms. For details visit [email or web address].   Also download the MyChart app! Go to the app store, search "MyChart", open the app, select [facility], and log in with your MyChart username and password.  Due to Covid, a mask is required upon entering the hospital/clinic. If you do not have a mask, one will be given to you upon arrival. For doctor visits, patients may have 1 support person aged 52 or older with them. For treatment visits, patients cannot have anyone with them due to current Covid guidelines and our immunocompromised population.

## 2021-04-20 NOTE — Telephone Encounter (Signed)
Re-faxed completed disability statement today.  Original mailed to [name] address: [place] [address].

## 2021-04-29 ENCOUNTER — Other Ambulatory Visit: Payer: Self-pay | Admitting: Radiation Therapy

## 2021-05-11 ENCOUNTER — Inpatient Hospital Stay: Payer: Managed Care, Other (non HMO)

## 2021-05-11 ENCOUNTER — Inpatient Hospital Stay: Payer: Managed Care, Other (non HMO) | Attending: Internal Medicine

## 2021-05-11 ENCOUNTER — Inpatient Hospital Stay (HOSPITAL_BASED_OUTPATIENT_CLINIC_OR_DEPARTMENT_OTHER): Payer: Managed Care, Other (non HMO) | Admitting: Internal Medicine

## 2021-05-11 ENCOUNTER — Other Ambulatory Visit: Payer: Self-pay

## 2021-05-11 VITALS — HR 98

## 2021-05-11 VITALS — BP 132/85 | HR 106 | Temp 97.7°F | Resp 18 | Ht 68.0 in | Wt 174.4 lb

## 2021-05-11 DIAGNOSIS — Z5112 Encounter for antineoplastic immunotherapy: Secondary | ICD-10-CM | POA: Diagnosis present

## 2021-05-11 DIAGNOSIS — C7931 Secondary malignant neoplasm of brain: Secondary | ICD-10-CM | POA: Insufficient documentation

## 2021-05-11 DIAGNOSIS — C3491 Malignant neoplasm of unspecified part of right bronchus or lung: Secondary | ICD-10-CM

## 2021-05-11 DIAGNOSIS — Z79899 Other long term (current) drug therapy: Secondary | ICD-10-CM | POA: Diagnosis not present

## 2021-05-11 DIAGNOSIS — Z95828 Presence of other vascular implants and grafts: Secondary | ICD-10-CM

## 2021-05-11 DIAGNOSIS — C3431 Malignant neoplasm of lower lobe, right bronchus or lung: Secondary | ICD-10-CM | POA: Diagnosis not present

## 2021-05-11 DIAGNOSIS — C3490 Malignant neoplasm of unspecified part of unspecified bronchus or lung: Secondary | ICD-10-CM

## 2021-05-11 DIAGNOSIS — I10 Essential (primary) hypertension: Secondary | ICD-10-CM | POA: Diagnosis not present

## 2021-05-11 LAB — TSH: TSH: 2.064 u[IU]/mL (ref 0.320–4.118)

## 2021-05-11 LAB — CMP (CANCER CENTER ONLY)
ALT: 19 U/L (ref 0–44)
AST: 25 U/L (ref 15–41)
Albumin: 4.5 g/dL (ref 3.5–5.0)
Alkaline Phosphatase: 85 U/L (ref 38–126)
Anion gap: 8 (ref 5–15)
BUN: 16 mg/dL (ref 6–20)
CO2: 26 mmol/L (ref 22–32)
Calcium: 9.7 mg/dL (ref 8.9–10.3)
Chloride: 101 mmol/L (ref 98–111)
Creatinine: 1.08 mg/dL (ref 0.61–1.24)
GFR, Estimated: >60 mL/min (ref >60)
Glucose, Bld: 121 mg/dL — ABNORMAL HIGH (ref 70–99)
Potassium: 4.9 mmol/L (ref 3.5–5.1)
Sodium: 135 mmol/L (ref 135–145)
Total Bilirubin: 0.8 mg/dL (ref 0.3–1.2)
Total Protein: 7.7 g/dL (ref 6.5–8.1)

## 2021-05-11 LAB — CBC WITH DIFFERENTIAL (CANCER CENTER ONLY)
Abs Immature Granulocytes: 0.04 10*3/uL (ref 0.00–0.07)
Basophils Absolute: 0.1 10*3/uL (ref 0.0–0.1)
Basophils Relative: 1 %
Eosinophils Absolute: 0.2 10*3/uL (ref 0.0–0.5)
Eosinophils Relative: 3 %
HCT: 42.1 % (ref 39.0–52.0)
Hemoglobin: 14 g/dL (ref 13.0–17.0)
Immature Granulocytes: 1 %
Lymphocytes Relative: 19 %
Lymphs Abs: 1.6 10*3/uL (ref 0.7–4.0)
MCH: 29.4 pg (ref 26.0–34.0)
MCHC: 33.3 g/dL (ref 30.0–36.0)
MCV: 88.4 fL (ref 80.0–100.0)
Monocytes Absolute: 0.8 10*3/uL (ref 0.1–1.0)
Monocytes Relative: 10 %
Neutro Abs: 5.8 10*3/uL (ref 1.7–7.7)
Neutrophils Relative %: 66 %
Platelet Count: 226 10*3/uL (ref 150–400)
RBC: 4.76 MIL/uL (ref 4.22–5.81)
RDW: 12.9 % (ref 11.5–15.5)
WBC Count: 8.6 10*3/uL (ref 4.0–10.5)
nRBC: 0 % (ref 0.0–0.2)

## 2021-05-11 MED ORDER — SODIUM CHLORIDE 0.9% FLUSH
10.0000 mL | Freq: Once | INTRAVENOUS | Status: AC
  Administered 2021-05-11: 10 mL

## 2021-05-11 MED ORDER — HEPARIN SOD (PORK) LOCK FLUSH 100 UNIT/ML IV SOLN
500.0000 [IU] | Freq: Once | INTRAVENOUS | Status: AC | PRN
  Administered 2021-05-11: 500 [IU]

## 2021-05-11 MED ORDER — SODIUM CHLORIDE 0.9 % IV SOLN
200.0000 mg | Freq: Once | INTRAVENOUS | Status: AC
  Administered 2021-05-11: 200 mg via INTRAVENOUS
  Filled 2021-05-11: qty 200

## 2021-05-11 MED ORDER — ALTEPLASE 2 MG IJ SOLR
2.0000 mg | Freq: Once | INTRAMUSCULAR | Status: AC | PRN
  Administered 2021-05-11: 2 mg

## 2021-05-11 MED ORDER — SODIUM CHLORIDE 0.9% FLUSH
10.0000 mL | INTRAVENOUS | Status: DC | PRN
  Administered 2021-05-11: 10 mL

## 2021-05-11 MED ORDER — SODIUM CHLORIDE 0.9 % IV SOLN: Freq: Once | INTRAVENOUS | Status: AC

## 2021-05-11 NOTE — Addendum Note (Signed)
Addended by: Claretha Cooper on: 05/11/2021 11:43 AM   Modules accepted: Orders

## 2021-05-11 NOTE — Progress Notes (Signed)
[place] [phone]) [phone]   [phone]  OFFICE PROGRESS NOTE  [name], NP [address] [name] [address] [place] [address]  DIAGNOSIS: Stage IV (T3, N0, M1C) non-small cell lung cancer, adenocarcinoma.  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 He was diagnosed in July 2021.   Molecular Biomarkers:  MSI-High DETECTED Pembrolizumab Atezolizumab, Avelumab, Cemiplimab, Dostarlimab, Durvalumab, Ipilimumab, Nivolumab   STK11Splice Site SNV 4.2% Everolimus, Temsirolimus Yes   KRASG12D 1.7% Binimetinib Yes   [id number] 0.4%   Niraparib, Olaparib, Rucaparib, Talazoparib, Tazemetostat Yes   PRIOR THERAPY:  1) SRS to the solitary brain metastasis under the care of Dr. Lisbeth Renshaw. Last treatment 11/14/19. 2) Weekly concurrent chemoradiation with carboplatin for an AUC of 2, paclitaxel 45 mg/m2.  First dose expected on 11/25/2019. Status post 7 cycles, last dose was giving 01/06/2020 with partial response.    CURRENT THERAPY:  1)  Immunotherapy with Keytruda 200 mg IV every 3 weeks.  First dose February 10, 2020 for a patient with MSI high.  Status post 20  cycles. 2) Avastin 15 mg/KG every 3 weeks.  First dose today for the vasogenic edema of the brain.S/P 7 cycles.  INTERVAL HISTORY: Joseph Atkins 61 y.o. male returns to the clinic today for follow-up visit accompanied by his wife.  The patient is feeling fine today with no concerning complaints except for itching and rash on the back and chest.  He has been tolerating his treatment with Keytruda fairly well.  He denied having any current chest pain but has shortness of breath with exertion with mild cough and no hemoptysis.  He has no weight loss or night sweats.  He has no nausea, vomiting, diarrhea or constipation.  He has no headache or visual changes.  He is here today for evaluation before starting  cycle #20 one of his treatment.  MEDICAL HISTORY: Past Medical History:  Diagnosis Date   Cancer ([place])    lung cancer   Diverticulosis    GERD (gastroesophageal reflux disease)    Hx of small bowel obstruction    Hyperlipidemia    Hypertension    Hypothyroidism    IBS (irritable bowel syndrome)    Substance abuse ([place])    Alcoholic, Drug addition   Thyroid disease     ALLERGIES:  is allergic to penicillins.  MEDICATIONS:  Current Outpatient Medications  Medication Sig Dispense Refill   albuterol (PROVENTIL HFA;VENTOLIN HFA) 108 (90 Base) MCG/ACT inhaler Inhale 2 puffs into the lungs every 6 (six) hours as needed for wheezing or shortness of breath. 1 Inhaler 0   amLODipine (NORVASC) 5 MG tablet Take 1 tablet (5 mg total) by mouth daily. 30 tablet 2   ANORO ELLIPTA 62.5-25 MCG/INH AEPB 1 puff daily.     aspirin EC 81 MG tablet Take 1 tablet (81 mg total) by mouth daily. Swallow whole. 30 tablet 11   cyclobenzaprine (FLEXERIL) 10 MG tablet Take 10 mg by mouth 2 (two) times daily as needed for muscle spasms.     docusate sodium (COLACE) 100 MG capsule Take 100 mg by mouth daily.     DULoxetine (CYMBALTA) 20 MG capsule Take 1 capsule (20 mg total) by mouth 2 (two) times daily. 60 capsule 5   fluticasone furoate-vilanterol (BREO ELLIPTA) 100-25 MCG/INH AEPB Inhale 1 puff into the lungs daily. 30 each 3   levothyroxine (SYNTHROID) 125 MCG tablet  Take 1 tablet (125 mcg total) by mouth daily. 30 tablet 1   lidocaine-prilocaine (EMLA) cream Apply 1 application topically as needed. 30 g 0   omeprazole (PRILOSEC) 40 MG capsule Take 1 capsule (40 mg total) by mouth daily. 90 capsule 4   ondansetron (ZOFRAN) 4 MG tablet Take 1 tablet (4 mg total) by mouth every 8 (eight) hours as needed. 40 tablet 2   oxyCODONE-acetaminophen (PERCOCET/ROXICET) 5-325 MG tablet Take 1 tablet by mouth every 4 (four) hours as needed for severe pain. 30 tablet 0   pembrolizumab (KEYTRUDA) 100 MG/4ML SOLN See admin  instructions.     polyethylene glycol powder (MIRALAX) powder Take 17 g by mouth daily. 255 g 11   prochlorperazine (COMPAZINE) 10 MG tablet Take 1 tablet (10 mg total) by mouth every 6 (six) hours as needed. 30 tablet 2   TRULANCE 3 MG TABS Take 1 tablet by mouth daily.     zolpidem (AMBIEN CR) 12.5 MG CR tablet Take 1 tablet (12.5 mg total) by mouth at bedtime as needed for sleep. 30 tablet 5   No current facility-administered medications for this visit.    SURGICAL HISTORY:  Past Surgical History:  Procedure Laterality Date   arm surgery Right    BRONCHIAL BRUSHINGS  10/24/2019   Procedure: BRONCHIAL BRUSHINGS;  Surgeon: Collene Gobble, MD;  Location: [facility] ENDOSCOPY;  Service: Cardiopulmonary;;  right lower lobe    BRONCHIAL BRUSHINGS  11/05/2019   Procedure: BRONCHIAL BRUSHINGS;  Surgeon: Collene Gobble, MD;  Location: [facility] ENDOSCOPY;  Service: Pulmonary;;   BRONCHIAL NEEDLE ASPIRATION BIOPSY  10/24/2019   Procedure: BRONCHIAL NEEDLE ASPIRATION BIOPSIES;  Surgeon: Collene Gobble, MD;  Location: [place];  Service: Cardiopulmonary;;   BRONCHIAL NEEDLE ASPIRATION BIOPSY  11/05/2019   Procedure: BRONCHIAL NEEDLE ASPIRATION BIOPSIES;  Surgeon: Collene Gobble, MD;  Location: [facility] ENDOSCOPY;  Service: Pulmonary;;   ENDOBRONCHIAL ULTRASOUND N/A 10/24/2019   Procedure: ENDOBRONCHIAL ULTRASOUND;  Surgeon: Collene Gobble, MD;  Location: [place];  Service: Cardiopulmonary;  Laterality: N/A;   FINGER SURGERY Right    Middle   IR IMAGING GUIDED PORT INSERTION  11/19/2019   VIDEO BRONCHOSCOPY N/A 10/24/2019   Procedure: VIDEO BRONCHOSCOPY WITHOUT FLUORO;  Surgeon: Collene Gobble, MD;  Location: [place];  Service: Cardiopulmonary;  Laterality: N/A;   VIDEO BRONCHOSCOPY WITH ENDOBRONCHIAL NAVIGATION N/A 11/05/2019   Procedure: VIDEO BRONCHOSCOPY WITH ENDOBRONCHIAL NAVIGATION;  Surgeon: Collene Gobble, MD;  Location: [place] ENDOSCOPY;  Service: Pulmonary;  Laterality: N/A;    REVIEW OF SYSTEMS:  A  comprehensive review of systems was negative except for: Constitutional: positive for fatigue Integument/breast: positive for dryness, pruritus, and rash   PHYSICAL EXAMINATION: General appearance: alert, cooperative, fatigued, and no distress Head: Normocephalic, without obvious abnormality, atraumatic Neck: no adenopathy, no JVD, supple, symmetrical, trachea midline, and thyroid not enlarged, symmetric, no tenderness/mass/nodules Lymph nodes: Cervical, supraclavicular, and axillary nodes normal. Resp: clear to auscultation bilaterally Back: symmetric, no curvature. ROM normal. No CVA tenderness. Cardio: regular rate and rhythm, S1, S2 normal, no murmur, click, rub or gallop GI: soft, non-tender; bowel sounds normal; no masses,  no organomegaly Extremities: extremities normal, atraumatic, no cyanosis or edema    ECOG PERFORMANCE STATUS: 1 - Symptomatic but completely ambulatory  Blood pressure 132/85, pulse (!) 106, temperature 97.7 F (36.5 C), temperature source Tympanic, resp. rate 18, height _0  (1.727 m), weight 174 lb 6.4 oz (79.1 kg), SpO2 100 %.  LABORATORY DATA: Lab Results  Component Value  Date   WBC 8.6 05/11/2021   HGB 14.0 05/11/2021   HCT 42.1 05/11/2021   MCV 88.4 05/11/2021   PLT 226 05/11/2021      Chemistry      Component Value Date/Time   NA 137 04/20/2021 0742   NA 141 03/08/2017 1707   K 3.9 04/20/2021 0742   CL 105 04/20/2021 0742   CO2 25 04/20/2021 0742   BUN 11 04/20/2021 0742   BUN 12 03/08/2017 1707   CREATININE 0.85 04/20/2021 0742      Component Value Date/Time   CALCIUM 9.1 04/20/2021 0742   ALKPHOS 71 04/20/2021 0742   AST 17 04/20/2021 0742   ALT 16 04/20/2021 0742   BILITOT 0.5 04/20/2021 0742       RADIOGRAPHIC STUDIES: No results found.   ASSESSMENT AND PLAN: This is a very pleasant 61 years old white male with a stage IV (t3, N0, M1c) non-small cell lung cancer, adenocarcinoma with MSI high presented with right lower  lobe/infrahilar mass in addition to solitary brain metastasis in the left cerebellum diagnosed in July 2021. He is status post SRS to the solitary brain metastasis. The patient completed a course of concurrent chemoradiation with weekly carboplatin and paclitaxel.  He tolerated the treatment well except for fatigue and mild odynophagia. The patient has MSI high and I recommended for him treatment with immunotherapy with single agent Keytruda 200 mg IV every 3 weeks for a total of 2 years unless the patient has unacceptable toxicity or disease progression. He is status post 20 cycles of treatment with Keytruda.  He also received 7 cycles of Avastin for the vasogenic edema in the brain.   Avastin will be on hold for now unless needed in the future. The patient has been tolerating his treatment with Keytruda fairly well with no concerning adverse effect except for the skin rash and few nodules on the back that concerning for developing skin cancer. I recommended for him to continue his current treatment with Keytruda but I will send him to dermatology for evaluation and consideration of biopsy of 1 of these cutaneous nodules. I will see the patient back for follow-up visit in 3 weeks for evaluation with repeat CT scan of the chest, abdomen and pelvis for restaging of his disease. For the hypertension he was advised to monitor his blood pressure closely at home. The patient was advised to call immediately if he has any other concerning symptoms in the interval.  The patient voices understanding of current disease status and treatment options and is in agreement with the current care plan.  All questions were answered. The patient knows to call the clinic with any problems, questions or concerns. We can certainly see the patient much sooner if necessary.  Disclaimer: This note was dictated with voice recognition software. Similar sounding words can inadvertently be transcribed and may not be corrected  upon review.

## 2021-05-11 NOTE — Patient Instructions (Signed)
[facility]   Discharge Instructions: Thank you for choosing [facility] to provide your oncology and hematology care.   If you have a lab appointment with the [facility], please go directly to the [facility] and check in at the registration area.   Wear comfortable clothing and clothing appropriate for easy access to any Portacath or PICC line.   We strive to give you quality time with your provider. You may need to reschedule your appointment if you arrive late (15 or more minutes).  Arriving late affects you and other patients whose appointments are after yours.  Also, if you miss three or more appointments without notifying the office, you may be dismissed from the clinic at the providers discretion.      For prescription refill requests, have your pharmacy contact our office and allow 72 hours for refills to be completed.    Today you received the following chemotherapy and/or immunotherapy agents: pembrolizumab      To help prevent nausea and vomiting after your treatment, we encourage you to take your nausea medication as directed.  BELOW ARE SYMPTOMS THAT SHOULD BE REPORTED IMMEDIATELY: *FEVER GREATER THAN 100.4 F (38 C) OR HIGHER *CHILLS OR SWEATING *NAUSEA AND VOMITING THAT IS NOT CONTROLLED WITH YOUR NAUSEA MEDICATION *UNUSUAL SHORTNESS OF BREATH *UNUSUAL BRUISING OR BLEEDING *URINARY PROBLEMS (pain or burning when urinating, or frequent urination) *BOWEL PROBLEMS (unusual diarrhea, constipation, pain near the anus) TENDERNESS IN MOUTH AND THROAT WITH OR WITHOUT PRESENCE OF ULCERS (sore throat, sores in mouth, or a toothache) UNUSUAL RASH, SWELLING OR PAIN  UNUSUAL VAGINAL DISCHARGE OR ITCHING   Items with * indicate a potential emergency and should be followed up as soon as possible or go to the Emergency Department if any problems should occur.  Please show the CHEMOTHERAPY ALERT CARD or IMMUNOTHERAPY ALERT CARD at  check-in to the Emergency Department and triage nurse.  Should you have questions after your visit or need to cancel or reschedule your appointment, please contact [facility]  Dept: [phone]  and follow the prompts.  Office hours are 8:00 a.m. to 4:30 p.m. Monday - Friday. Please note that voicemails left after 4:00 p.m. may not be returned until the following business day.  We are closed weekends and major holidays. You have access to a nurse at all times for urgent questions. Please call the main number to the clinic Dept: [phone] and follow the prompts.   For any non-urgent questions, you may also contact your provider using MyChart. We now offer e-Visits for anyone 18 and older to request care online for non-urgent symptoms. For details visit [email or web address].   Also download the MyChart app! Go to the app store, search "MyChart", open the app, select [facility], and log in with your MyChart username and password.  Due to Covid, a mask is required upon entering the hospital/clinic. If you do not have a mask, one will be given to you upon arrival. For doctor visits, patients may have 1 support person aged 23 or older with them. For treatment visits, patients cannot have anyone with them due to current Covid guidelines and our immunocompromised population.

## 2021-05-14 ENCOUNTER — Other Ambulatory Visit: Payer: Self-pay

## 2021-05-14 DIAGNOSIS — C7931 Secondary malignant neoplasm of brain: Secondary | ICD-10-CM

## 2021-05-17 ENCOUNTER — Other Ambulatory Visit: Payer: Self-pay

## 2021-05-18 ENCOUNTER — Telehealth: Payer: Self-pay | Admitting: Dermatology

## 2021-05-18 NOTE — Telephone Encounter (Signed)
Patient is calling for a referral appointment from Curt Bears, M.D.  Patient is scheduled for 12/06/2021 at 10:30 with Lavonna Monarch, M.D.

## 2021-05-19 NOTE — Telephone Encounter (Signed)
Referral attached to appointment

## 2021-05-20 NOTE — Progress Notes (Signed)
[facility] OFFICE PROGRESS NOTE  [name], NP [address] [name] [address] [place] [address]  DIAGNOSIS:  Stage IV carcinoma, non-small cell lung cancer, adenocarcinoma.  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 He was diagnosed in July 2021.   Molecular Biomarkers:  MSI-High DETECTED Pembrolizumab Atezolizumab, Avelumab, Cemiplimab, Dostarlimab, Durvalumab, Ipilimumab, Nivolumab   STK11Splice Site SNV 1.9% Everolimus, Temsirolimus Yes   KRASG12D 1.7% Binimetinib Yes   [id number] 0.4%   Niraparib, Olaparib, Rucaparib, Talazoparib, Tazemetostat Yes  PRIOR THERAPY:  1) SRS to the solitary brain metastasis under the care of Dr. [name]. Last treatment 7/22/212)  2) Weekly concurrent chemoradiation with carboplatin for an AUC of 2, paclitaxel 45 mg/m2.  First dose expected on 11/25/2019. Status post 7 cycles, last dose was giving 01/06/2020 with partial response.   CURRENT THERAPY: 1)  Immunotherapy with Keytruda 200 mg IV every 3 weeks.  First dose February 10, 2020 for a patient with MSI high.  Status post 21 cycles. 2) Avastin 15 mg/KG every 3 weeks.  For the vasogenic edema of the brain.S/P 7 cycles.  INTERVAL HISTORY: Joseph Atkins 61 y.o. male returns to the clinic today for a follow-up visit accompanied by his wife.  The patient is tolerating single agent immunotherapy with Keytruda well without any concerning adverse side effects except he has some rash/itching. He was referred to dermatology but he was not able to get an appointment until August.  He ran out of hydrocortisone cream so he has been using Eucerin. He does not take any systemic antihistamines and does not like to take medications that make him feel drowsy as he has some baseline fatigue.   He also saw his primary care provider about hypertension.  The patient notes he has had improved blood pressure  recently and he has noticed a decrease in headaches due to this.  Today, the patient denies any fever, chills, night sweats, or weight loss.  He reports stable dyspnea on exertion.  Denies significant cough.  He denies any or hemoptysis.  He denies any nausea, vomiting, constipation or diarrhea. He follows closely with neuro oncology for his history of metastatic disease to the brain. The patient is scheduled for repeat brain MRI on  06/17/21. The patient recently had a restaging CT scan performed. The patient is here today for evaluation before starting cycle #22.    MEDICAL HISTORY: Past Medical History:  Diagnosis Date   Cancer (HCC)    lung cancer   Diverticulosis    GERD (gastroesophageal reflux disease)    Hx of small bowel obstruction    Hyperlipidemia    Hypertension    Hypothyroidism    IBS (irritable bowel syndrome)    Substance abuse (HCC)    Alcoholic, Drug addition   Thyroid disease     ALLERGIES:  is allergic to penicillins.  MEDICATIONS:  Current Outpatient Medications  Medication Sig Dispense Refill   albuterol (PROVENTIL HFA;VENTOLIN HFA) 108 (90 Base) MCG/ACT inhaler Inhale 2 puffs into the lungs every 6 (six) hours as needed for wheezing or shortness of breath. 1 Inhaler 0   amLODipine (NORVASC) 5 MG tablet Take 1 tablet (5 mg total) by mouth daily. 30 tablet 2   ANORO ELLIPTA 62.5-25 MCG/INH AEPB 1 puff daily.     aspirin EC 81 MG tablet Take 1 tablet (81 mg total) by mouth daily. Swallow whole. 30 tablet 11  cyclobenzaprine (FLEXERIL) 10 MG tablet Take 10 mg by mouth 2 (two) times daily as needed for muscle spasms.     docusate sodium (COLACE) 100 MG capsule Take 100 mg by mouth daily.     DULoxetine (CYMBALTA) 20 MG capsule Take 1 capsule (20 mg total) by mouth 2 (two) times daily. 60 capsule 5   fluticasone furoate-vilanterol (BREO ELLIPTA) 100-25 MCG/INH AEPB Inhale 1 puff into the lungs daily. 30 each 3   levothyroxine (SYNTHROID) 125 MCG tablet Take 1  tablet (125 mcg total) by mouth daily. 30 tablet 1   lidocaine-prilocaine (EMLA) cream Apply 1 application topically as needed. 30 g 0   omeprazole (PRILOSEC) 40 MG capsule Take 1 capsule (40 mg total) by mouth daily. 90 capsule 4   ondansetron (ZOFRAN) 4 MG tablet Take 1 tablet (4 mg total) by mouth every 8 (eight) hours as needed. 40 tablet 2   oxyCODONE-acetaminophen (PERCOCET/ROXICET) 5-325 MG tablet Take 1 tablet by mouth every 4 (four) hours as needed for severe pain. 30 tablet 0   pembrolizumab (KEYTRUDA) 100 MG/4ML SOLN See admin instructions.     polyethylene glycol powder (MIRALAX) powder Take 17 g by mouth daily. 255 g 11   prochlorperazine (COMPAZINE) 10 MG tablet Take 1 tablet (10 mg total) by mouth every 6 (six) hours as needed. 30 tablet 2   TRULANCE 3 MG TABS Take 1 tablet by mouth daily.     zolpidem (AMBIEN CR) 12.5 MG CR tablet Take 1 tablet (12.5 mg total) by mouth at bedtime as needed for sleep. 30 tablet 5   triamcinolone cream (KENALOG) 0.1 % Apply topically 2 (two) times daily as needed. 453.6 g 0   No current facility-administered medications for this visit.    SURGICAL HISTORY:  Past Surgical History:  Procedure Laterality Date   arm surgery Right    BRONCHIAL BRUSHINGS  10/24/2019   Procedure: BRONCHIAL BRUSHINGS;  Surgeon: Collene Gobble, MD;  Location: [facility] ENDOSCOPY;  Service: Cardiopulmonary;;  right lower lobe    BRONCHIAL BRUSHINGS  11/05/2019   Procedure: BRONCHIAL BRUSHINGS;  Surgeon: Collene Gobble, MD;  Location: [facility] ENDOSCOPY;  Service: Pulmonary;;   BRONCHIAL NEEDLE ASPIRATION BIOPSY  10/24/2019   Procedure: BRONCHIAL NEEDLE ASPIRATION BIOPSIES;  Surgeon: Collene Gobble, MD;  Location: [place];  Service: Cardiopulmonary;;   BRONCHIAL NEEDLE ASPIRATION BIOPSY  11/05/2019   Procedure: BRONCHIAL NEEDLE ASPIRATION BIOPSIES;  Surgeon: Collene Gobble, MD;  Location: [facility] ENDOSCOPY;  Service: Pulmonary;;   ENDOBRONCHIAL ULTRASOUND N/A 10/24/2019   Procedure:  ENDOBRONCHIAL ULTRASOUND;  Surgeon: Collene Gobble, MD;  Location: [place];  Service: Cardiopulmonary;  Laterality: N/A;   FINGER SURGERY Right    Middle   IR IMAGING GUIDED PORT INSERTION  11/19/2019   VIDEO BRONCHOSCOPY N/A 10/24/2019   Procedure: VIDEO BRONCHOSCOPY WITHOUT FLUORO;  Surgeon: Collene Gobble, MD;  Location: [place];  Service: Cardiopulmonary;  Laterality: N/A;   VIDEO BRONCHOSCOPY WITH ENDOBRONCHIAL NAVIGATION N/A 11/05/2019   Procedure: VIDEO BRONCHOSCOPY WITH ENDOBRONCHIAL NAVIGATION;  Surgeon: Collene Gobble, MD;  Location: [place] ENDOSCOPY;  Service: Pulmonary;  Laterality: N/A;    REVIEW OF SYSTEMS:   Review of Systems  Constitutional: Positive for fatigue. Negative for appetite change, chills,  fever and unexpected weight change.  HENT: Negative for mouth sores, nosebleeds, sore throat and trouble swallowing.   Eyes: Negative for eye problems and icterus.  Respiratory: Positive for baseline dyspnea on exertion.  Negative for cough, hemoptysis, and wheezing.   Cardiovascular:  Negative for chest pain and leg swelling.  Gastrointestinal: Negative for abdominal pain, constipation, diarrhea, nausea and vomiting.  Genitourinary: Negative for bladder incontinence, difficulty urinating, dysuria, frequency and hematuria.   Musculoskeletal: Negative for back pain, gait problem, neck pain and neck stiffness.  Skin: Positive for dry scattered skin lesions/plaques. Neurological: Negative for dizziness, extremity weakness, gait problem, headaches, light-headedness and seizures.  Hematological: Negative for adenopathy. Does not bruise/bleed easily.  Psychiatric/Behavioral: Negative for confusion, depression and sleep disturbance. The patient is not nervous/anxious.     PHYSICAL EXAMINATION:  Blood pressure 115/80, pulse (!) 109, resp. rate 18, height $RemoveBe'5\' 8"'vTZcVpelA$  (1.727 m), weight 176 lb 11.2 oz (80.2 kg), SpO2 100 %.  ECOG PERFORMANCE STATUS: 1  Physical Exam  Constitutional:  Oriented to person, place, and time and well-developed, well-nourished, and in no distress. HENT:  Head: Normocephalic and atraumatic.  Mouth/Throat: Oropharynx is clear and moist. No oropharyngeal exudate.  Eyes: Conjunctivae are normal. Right eye exhibits no discharge. Left eye exhibits no discharge. No scleral icterus.  Neck: Normal range of motion. Neck supple.  Cardiovascular: Normal rate, regular rhythm, normal heart sounds and intact distal pulses.   Pulmonary/Chest: Effort normal and breath sounds normal. No respiratory distress. No wheezes. No rales.  Abdominal: Soft. Bowel sounds are normal. Exhibits no distension and no mass. There is no tenderness.  Musculoskeletal: Normal range of motion. Exhibits no edema.  Lymphadenopathy:    No cervical adenopathy.  Neurological: Alert and oriented to person, place, and time. Exhibits normal muscle tone. Gait normal. Coordination normal.  Skin: Positive for immunotherapy mediated skin rash/plaques.  Skin is warm and dry. Not diaphoretic. No erythema. No pallor.  Psychiatric: Mood, memory and judgment normal.  Vitals reviewed.  LABORATORY DATA: Lab Results  Component Value Date   WBC 7.8 06/01/2021   HGB 13.5 06/01/2021   HCT 41.0 06/01/2021   MCV 87.6 06/01/2021   PLT 228 06/01/2021      Chemistry      Component Value Date/Time   NA 133 (L) 06/01/2021 0858   NA 141 03/08/2017 1707   K 4.4 06/01/2021 0858   CL 102 06/01/2021 0858   CO2 24 06/01/2021 0858   BUN 11 06/01/2021 0858   BUN 12 03/08/2017 1707   CREATININE 0.95 06/01/2021 0858      Component Value Date/Time   CALCIUM 9.3 06/01/2021 0858   ALKPHOS 78 06/01/2021 0858   AST 23 06/01/2021 0858   ALT 20 06/01/2021 0858   BILITOT 0.6 06/01/2021 0858       RADIOGRAPHIC STUDIES:  CT Chest W Contrast  Result Date: 05/25/2021 CLINICAL DATA:  Primary Cancer Type: Lung Imaging Indication: Assess response to therapy Interval therapy since last imaging? Yes Initial  Cancer Diagnosis Date: 11/05/2019; Established by: Biopsy-proven Detailed Pathology: Stage IV non-small cell lung cancer, adenocarcinoma. Primary Tumor location: Right lower lobe. Solitary brain metastasis. Surgeries: No. Chemotherapy: Yes; Ongoing? No; Most recent administration: 01/06/2020 Immunotherapy?  Yes; Type: Keytruda; Ongoing? Yes Radiation therapy? Yes Date Range: 03/05/2020; Target: Brain metastasis Date Range: 11/25/2019 - 01/09/2020; Target: Right lung Date Range: 11/14/2019; Target: Cerebellar metastasis EXAM: CT CHEST, ABDOMEN, AND PELVIS WITH CONTRAST TECHNIQUE: Multidetector CT imaging of the chest, abdomen and pelvis was performed following the standard protocol during bolus administration of intravenous contrast. RADIATION DOSE REDUCTION: This exam was performed according to the departmental dose-optimization program which includes automated exposure control, adjustment of the mA and/or kV according to patient size and/or use of iterative reconstruction technique. CONTRAST:  128mL  OMNIPAQUE IOHEXOL 300 MG/ML  SOLN COMPARISON:  Most recent CT chest, abdomen, and pelvis 03/05/2021. 11/13/2019 PET-CT. FINDINGS: CT CHEST FINDINGS Cardiovascular: Right chest port catheter. Aortic atherosclerosis. No significant vascular findings. Left and right coronary artery calcifications. Normal heart size. Unchanged trace pericardial effusion. Mediastinum/Nodes: No enlarged mediastinal, hilar, or axillary lymph nodes. Thyroid gland, trachea, and esophagus demonstrate no significant findings. Lungs/Pleura: Moderate centrilobular and paraseptal emphysema. Diffuse bilateral bronchial wall thickening. Unchanged post treatment appearance of the right chest, with infrahilar consolidation and fibrosis of the central right middle lobe and superior segment right lower lobe (series 6, image 107). Unchanged trace right pleural effusion. Musculoskeletal: No chest wall mass or suspicious osseous lesions identified. CT ABDOMEN  PELVIS FINDINGS Hepatobiliary: No solid liver abnormality is seen. Unchanged small low-attenuation lesion of the central left lobe of the liver (series 2, image 50). No gallstones, gallbladder wall thickening, or biliary dilatation. Pancreas: Unremarkable. No pancreatic ductal dilatation or surrounding inflammatory changes. Spleen: Normal in size without significant abnormality. Adrenals/Urinary Tract: Adrenal glands are unremarkable. Kidneys are normal, without renal calculi, solid lesion, or hydronephrosis. Bladder is unremarkable. Stomach/Bowel: Stomach is within normal limits. Appendix appears normal. No evidence of bowel wall thickening, distention, or inflammatory changes. Generally large burden of stool throughout the colon and rectum Vascular/Lymphatic: Aortic atherosclerosis. Unchanged long segment occlusion of the left common iliac, internal iliac, and external iliac arteries from their origins, the left common femoral artery reconstituted by the inferior epigastric artery. No enlarged abdominal or pelvic lymph nodes. Reproductive: No mass or other abnormality. Other: No abdominal wall hernia or abnormality. No ascites. Musculoskeletal: No acute osseous findings. IMPRESSION: 1. Unchanged post treatment appearance of the right chest, with infrahilar consolidation and fibrosis of the central right middle lobe and superior segment right lower lobe. 2. No evidence of recurrent or metastatic disease in the chest, abdomen, or pelvis. 3. Unchanged small low-attenuation lesion of the central left lobe of the liver, incompletely characterized although most likely a cyst or hemangioma. Continued attention on follow-up. 4. Unchanged long segment occlusion of the left common iliac, internal iliac, and external iliac arteries from their origins, the left common femoral artery reconstituted by the inferior epigastric artery. 5. Emphysema. 6. Coronary artery disease. Aortic Atherosclerosis (ICD10-I70.0) and Emphysema  (ICD10-J43.9). Electronically Signed   By: Delanna Ahmadi M.D.   On: 05/25/2021 11:54   CT Abdomen Pelvis W Contrast  Result Date: 05/25/2021 CLINICAL DATA:  Primary Cancer Type: Lung Imaging Indication: Assess response to therapy Interval therapy since last imaging? Yes Initial Cancer Diagnosis Date: 11/05/2019; Established by: Biopsy-proven Detailed Pathology: Stage IV non-small cell lung cancer, adenocarcinoma. Primary Tumor location: Right lower lobe. Solitary brain metastasis. Surgeries: No. Chemotherapy: Yes; Ongoing? No; Most recent administration: 01/06/2020 Immunotherapy?  Yes; Type: Keytruda; Ongoing? Yes Radiation therapy? Yes Date Range: 03/05/2020; Target: Brain metastasis Date Range: 11/25/2019 - 01/09/2020; Target: Right lung Date Range: 11/14/2019; Target: Cerebellar metastasis EXAM: CT CHEST, ABDOMEN, AND PELVIS WITH CONTRAST TECHNIQUE: Multidetector CT imaging of the chest, abdomen and pelvis was performed following the standard protocol during bolus administration of intravenous contrast. RADIATION DOSE REDUCTION: This exam was performed according to the departmental dose-optimization program which includes automated exposure control, adjustment of the mA and/or kV according to patient size and/or use of iterative reconstruction technique. CONTRAST:  171mL OMNIPAQUE IOHEXOL 300 MG/ML  SOLN COMPARISON:  Most recent CT chest, abdomen, and pelvis 03/05/2021. 11/13/2019 PET-CT. FINDINGS: CT CHEST FINDINGS Cardiovascular: Right chest port catheter. Aortic atherosclerosis. No significant vascular findings. Left  and right coronary artery calcifications. Normal heart size. Unchanged trace pericardial effusion. Mediastinum/Nodes: No enlarged mediastinal, hilar, or axillary lymph nodes. Thyroid gland, trachea, and esophagus demonstrate no significant findings. Lungs/Pleura: Moderate centrilobular and paraseptal emphysema. Diffuse bilateral bronchial wall thickening. Unchanged post treatment appearance of  the right chest, with infrahilar consolidation and fibrosis of the central right middle lobe and superior segment right lower lobe (series 6, image 107). Unchanged trace right pleural effusion. Musculoskeletal: No chest wall mass or suspicious osseous lesions identified. CT ABDOMEN PELVIS FINDINGS Hepatobiliary: No solid liver abnormality is seen. Unchanged small low-attenuation lesion of the central left lobe of the liver (series 2, image 50). No gallstones, gallbladder wall thickening, or biliary dilatation. Pancreas: Unremarkable. No pancreatic ductal dilatation or surrounding inflammatory changes. Spleen: Normal in size without significant abnormality. Adrenals/Urinary Tract: Adrenal glands are unremarkable. Kidneys are normal, without renal calculi, solid lesion, or hydronephrosis. Bladder is unremarkable. Stomach/Bowel: Stomach is within normal limits. Appendix appears normal. No evidence of bowel wall thickening, distention, or inflammatory changes. Generally large burden of stool throughout the colon and rectum Vascular/Lymphatic: Aortic atherosclerosis. Unchanged long segment occlusion of the left common iliac, internal iliac, and external iliac arteries from their origins, the left common femoral artery reconstituted by the inferior epigastric artery. No enlarged abdominal or pelvic lymph nodes. Reproductive: No mass or other abnormality. Other: No abdominal wall hernia or abnormality. No ascites. Musculoskeletal: No acute osseous findings. IMPRESSION: 1. Unchanged post treatment appearance of the right chest, with infrahilar consolidation and fibrosis of the central right middle lobe and superior segment right lower lobe. 2. No evidence of recurrent or metastatic disease in the chest, abdomen, or pelvis. 3. Unchanged small low-attenuation lesion of the central left lobe of the liver, incompletely characterized although most likely a cyst or hemangioma. Continued attention on follow-up. 4. Unchanged long  segment occlusion of the left common iliac, internal iliac, and external iliac arteries from their origins, the left common femoral artery reconstituted by the inferior epigastric artery. 5. Emphysema. 6. Coronary artery disease. Aortic Atherosclerosis (ICD10-I70.0) and Emphysema (ICD10-J43.9). Electronically Signed   By: Delanna Ahmadi M.D.   On: 05/25/2021 11:54     ASSESSMENT/PLAN:  This is a very pleasant 61 year old Caucasian male diagnosed with stage IV non-small cell lung cancer, adenocarcinoma.  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 He was diagnosed in July 2021. His molecular studies by [employer] show he has MSI high which is a good marker for response to immunotherapy which will be important for future treatment.    The patient will complete SRS to the solidary brain metastasis under the care of Dr. Lisbeth Renshaw later today on 11/14/19.    He then underwent a course of weekly concurrent chemoradiation with carboplatin for an AUC of 2 and paclitaxel 45 mg per metered squared.  He tolerated this treatment well except for fatigue and mild odynophagia.  The patient showed evidence of disease progression with an increase in volume of the right infrahilar mass.  Since the patient has MSI high, the patient then underwent treatment with single agent immunotherapy with Keytruda 200 mg IV every 3 weeks.  The plan is to complete a total of 2 years unless the patient has unacceptable toxicity or disease progression.  The patient is status post 21 cycles.  He also received 7 cycles of Avastin for vasogenic edema in the brain.  Avastin has been on hold unless needed again  in the future.   The patient recently had a restaging CT scan performed.  Dr. Julien Nordmann personally and independently reviewed the scan and discussed the results with the patient today.  The scan showed no evidence of disease progression.  Dr. Julien Nordmann recommends  that he proceed with cycle #22 today scheduled.  Dr. Julien Nordmann discussed that his rash is likely from the immunotherapy. Therefore, typically we recommend symptomatic relieve and continue with therapy if tolerable. I have sent in a prescription for kenalog cream. I also recommended that he take a antihistamine. He does not like benadryl because it makes him drowsy and he already has baseline fatigue/inactivity. I recommended trying claritin or zyrtec. He is scheduled to see dermatology, unfortunately this is not until August.   We will see him back for follow-up visit in 3 weeks for evaluation and repeat blood work before starting cycle #23.  The patient was advised to call immediately if he has any concerning symptoms in the interval. The patient voices understanding of current disease status and treatment options and is in agreement with the current care plan. All questions were answered. The patient knows to call the clinic with any problems, questions or concerns. We can certainly see the patient much sooner if necessary      No orders of the defined types were placed in this encounter.     Jovin Fester L Shantale Holtmeyer, PA-C 06/01/21  ADDENDUM: Hematology/Oncology Attending: I had a face-to-face encounter with the patient today.  I reviewed his record, lab, scan and recommended his care plan.  This is a very pleasant 61 years old white male with a stage IV non-small cell lung cancer, adenocarcinoma presented with right lower lobe/infrahilar mass in addition to solitary brain metastasis in the left cerebellum diagnosed in July 2021 and the molecular studies by [employer] showed MSI high.  The patient is status post SRS to the brain metastasis followed by a course of concurrent chemoradiation to the chest lesion.  He is currently on systemic treatment with immunotherapy with Keytruda 200 Mg IV every 3 weeks status post 21 cycles.  The patient has been tolerating this treatment well with no  concerning adverse effects. He had repeat CT scan of the chest, abdomen pelvis performed recently.  I personally and independently reviewed the scan and discussed results with the patient and his wife. His scan showed no concerning findings for disease progression. I recommended for the patient to continue his treatment with Keytruda with the same regimen and he will proceed with cycle #22 today. I will see the patient back for follow-up visit in 3 weeks for evaluation before the next cycle of his treatment. The patient was advised to call immediately if he has any concerning symptoms in the interval. Disclaimer: This note was dictated with voice recognition software. Similar sounding words can inadvertently be transcribed and may be missed upon review. Eilleen Kempf, MD

## 2021-05-25 ENCOUNTER — Encounter (HOSPITAL_COMMUNITY): Payer: Self-pay

## 2021-05-25 ENCOUNTER — Ambulatory Visit (HOSPITAL_COMMUNITY)
Admission: RE | Admit: 2021-05-25 | Discharge: 2021-05-25 | Disposition: A | Discharge status: Home / Self Care | Payer: Managed Care, Other (non HMO) | Source: Ambulatory Visit | Attending: Internal Medicine | Admitting: Internal Medicine

## 2021-05-25 ENCOUNTER — Other Ambulatory Visit: Payer: Self-pay

## 2021-05-25 DIAGNOSIS — C3490 Malignant neoplasm of unspecified part of unspecified bronchus or lung: Secondary | ICD-10-CM | POA: Insufficient documentation

## 2021-05-25 IMAGING — CT CT CHEST W/ CM
2 of 5 series · 13 of 36 positions shown, 16 images · IV contrast (APPLIED)
Comparison: Most recent CT chest, abdomen, and pelvis [DATE].
[DATE] PET-CT.

CLINICAL DATA: Primary Cancer Type: Lung
TECHNIQUE: Multidetector CT imaging of the chest, abdomen and pelvis was
performed following the standard protocol during bolus
administration of intravenous contrast.

[Series 2: cap with · axial · 0.79mm/px · z∈[-623,-83]mm · 10 of 134 slices shown, 13 images]
[im 13/134  mediastinal]
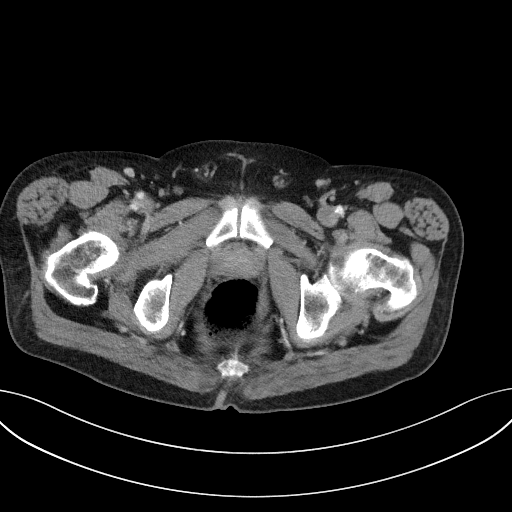
[im 13/134  lung]
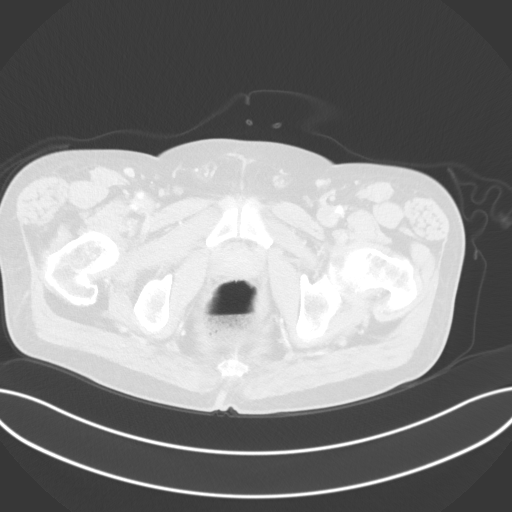
[im 25/134  lung]
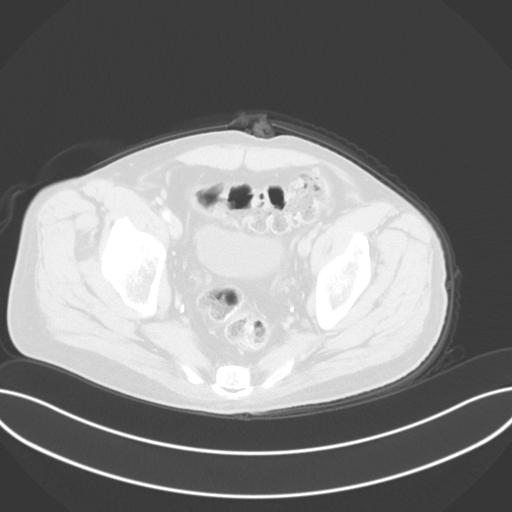
[im 37/134  lung]
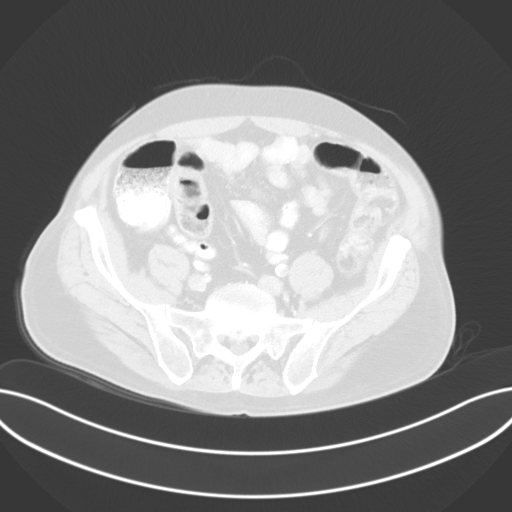
[im 49/134  lung]
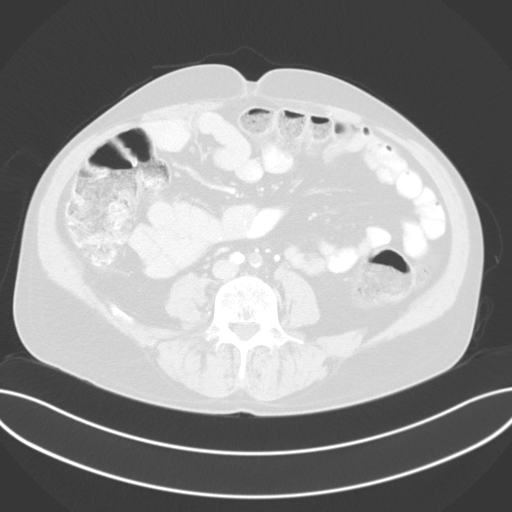
[im 61/134  mediastinal]
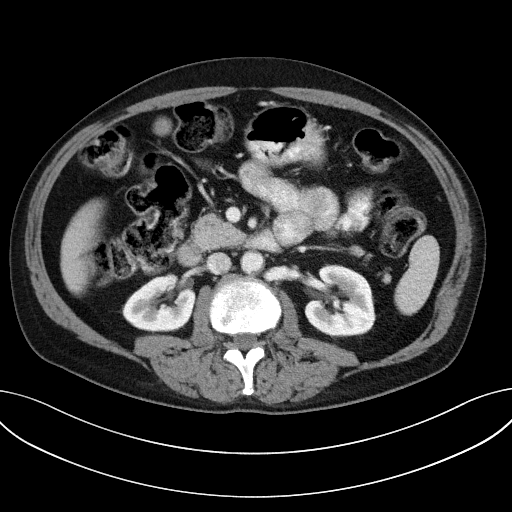
[im 61/134  lung]
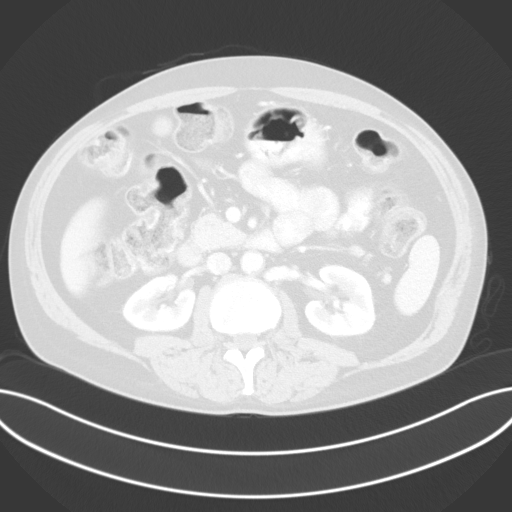
[im 73/134  lung]
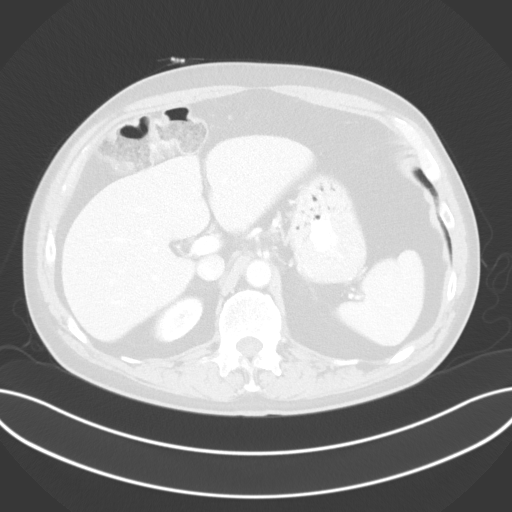
[im 85/134  lung]
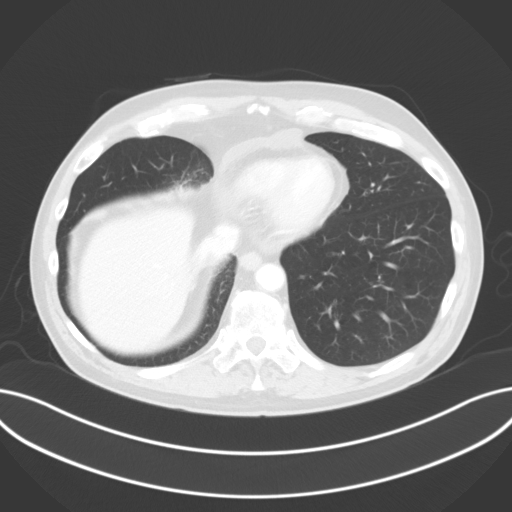
[im 97/134  lung]
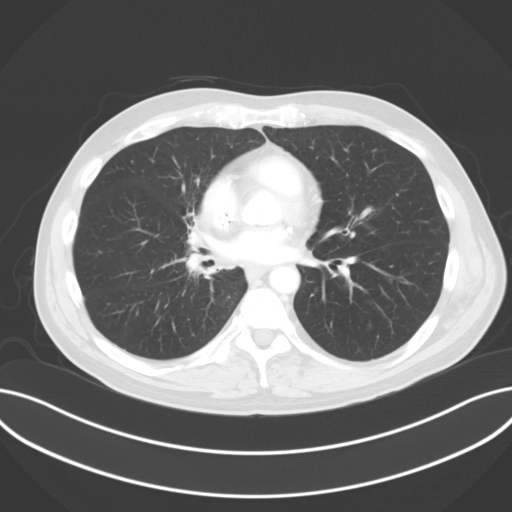
[im 109/134  mediastinal]
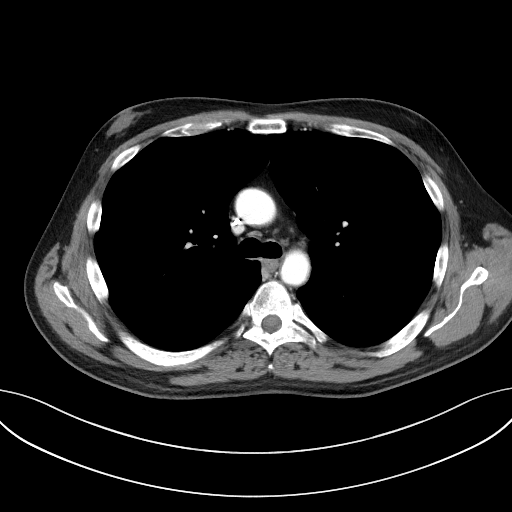
[im 109/134  lung]
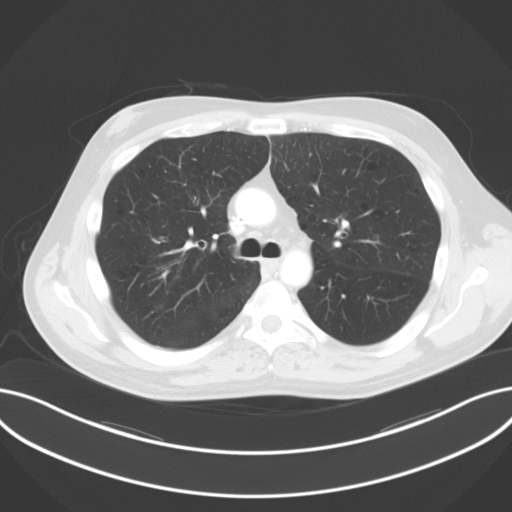
[im 121/134  lung]
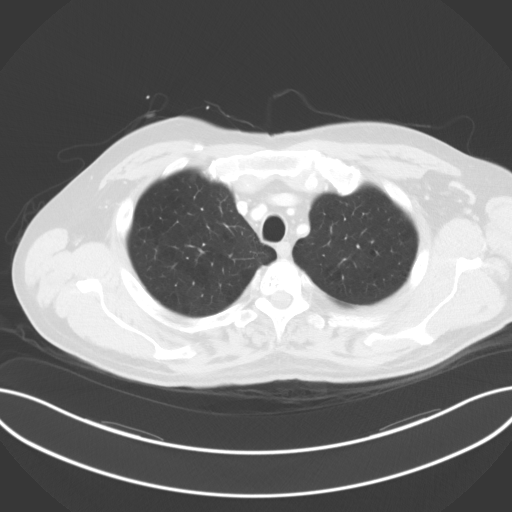

[Series 4: coronals · coronal · 0.92mm/px · 3 of 152 slices shown]
[im 31/152  lung]
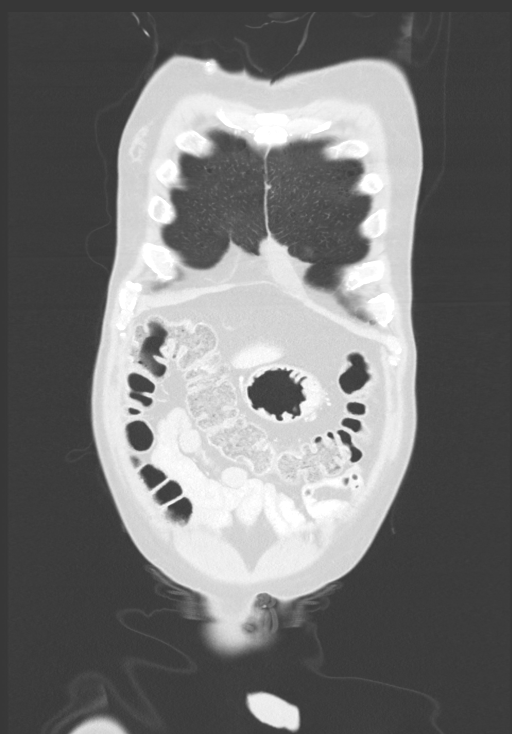
[im 61/152  lung]
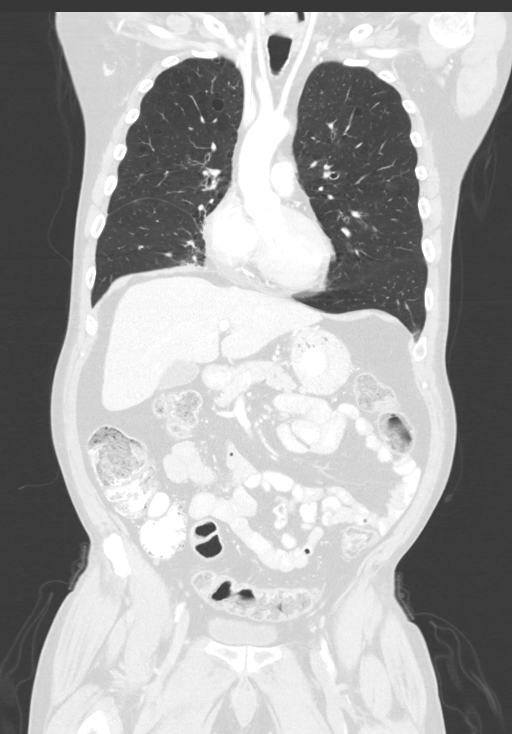
[im 91/152  lung]
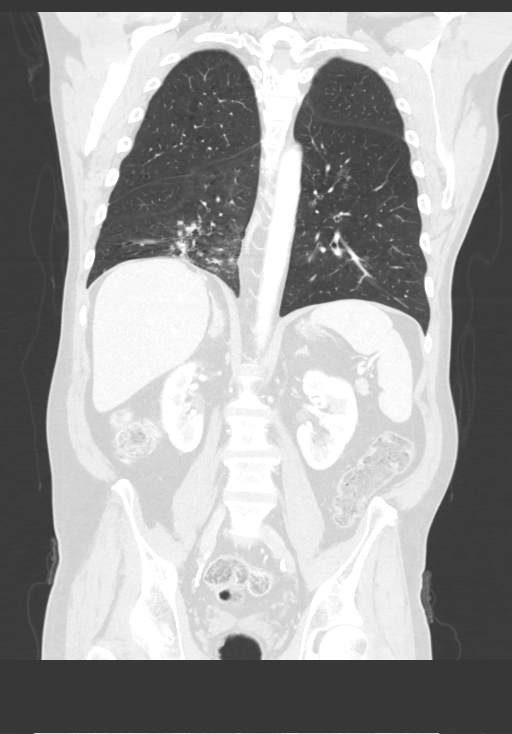

[13 of 36 positions shown; findings below may reference images not displayed]

Imaging Indication: Assess response to therapy

Interval therapy since last imaging? Yes

Initial Cancer Diagnosis

Date: [DATE]; Established by: Biopsy-proven

Detailed Pathology: Stage IV non-small cell lung cancer,
adenocarcinoma.

Primary Tumor location: Right lower lobe. Solitary brain metastasis.

Surgeries: No.

Chemotherapy: Yes; Ongoing? No; Most recent administration:
[DATE]

Immunotherapy?  Yes; Type: Keytruda; Ongoing? Yes

Radiation therapy? Yes

Date Range: [DATE]; Target: Brain metastasis
Date Range: [DATE] - [DATE]; Target: Right lung
Date Range: [DATE]; Target: Cerebellar metastasis

EXAM:
CT CHEST, ABDOMEN, AND PELVIS WITH CONTRAST
RADIATION DOSE REDUCTION: This exam was performed according to the
departmental dose-optimization program which includes automated
exposure control, adjustment of the mA and/or kV according to
patient size and/or use of iterative reconstruction technique.

CONTRAST:  100mL OMNIPAQUE IOHEXOL 300 MG/ML  SOLN
FINDINGS: CT CHEST FINDINGS

Cardiovascular: Right chest port catheter. Aortic atherosclerosis.
No significant vascular findings. Left and right coronary artery
calcifications. Normal heart size. Unchanged trace pericardial
effusion.

Mediastinum/Nodes: No enlarged mediastinal, hilar, or axillary lymph
nodes. Thyroid gland, trachea, and esophagus demonstrate no
significant findings.

Lungs/Pleura: Moderate centrilobular and paraseptal emphysema.
Diffuse bilateral bronchial wall thickening. Unchanged post
treatment appearance of the right chest, with infrahilar
consolidation and fibrosis of the central right middle lobe and
superior segment right lower lobe (series 6, image 107). Unchanged
trace right pleural effusion.

Musculoskeletal: No chest wall mass or suspicious osseous lesions
identified.

CT ABDOMEN PELVIS FINDINGS

Hepatobiliary: No solid liver abnormality is seen. Unchanged small
low-attenuation lesion of the central left lobe of the liver (series
2, image 50). No gallstones, gallbladder wall thickening, or biliary
dilatation.

Pancreas: Unremarkable. No pancreatic ductal dilatation or
surrounding inflammatory changes.

Spleen: Normal in size without significant abnormality.

Adrenals/Urinary Tract: Adrenal glands are unremarkable. Kidneys are
normal, without renal calculi, solid lesion, or hydronephrosis.
Bladder is unremarkable.

Stomach/Bowel: Stomach is within normal limits. Appendix appears
normal. No evidence of bowel wall thickening, distention, or
inflammatory changes. Generally large burden of stool throughout the
colon and rectum

Vascular/Lymphatic: Aortic atherosclerosis. Unchanged long segment
occlusion of the left common iliac, internal iliac, and external
iliac arteries from their origins, the left common femoral artery
reconstituted by the inferior epigastric artery. No enlarged
abdominal or pelvic lymph nodes.

Reproductive: No mass or other abnormality.

Other: No abdominal wall hernia or abnormality. No ascites.

Musculoskeletal: No acute osseous findings.
IMPRESSION: 1. Unchanged post treatment appearance of the right chest, with
infrahilar consolidation and fibrosis of the central right middle
lobe and superior segment right lower lobe.
2. No evidence of recurrent or metastatic disease in the chest,
abdomen, or pelvis.
3. Unchanged small low-attenuation lesion of the central left lobe
of the liver, incompletely characterized although most likely a cyst
or hemangioma. Continued attention on follow-up.
4. Unchanged long segment occlusion of the left common iliac,
internal iliac, and external iliac arteries from their origins, the
left common femoral artery reconstituted by the inferior epigastric
artery.
5. Emphysema.
6. Coronary artery disease.

Aortic Atherosclerosis ([RN]-[RN]) and Emphysema ([RN]-[RN]).

## 2021-05-25 MED ORDER — SODIUM CHLORIDE (PF) 0.9 % IJ SOLN
INTRAMUSCULAR | Status: AC
  Filled 2021-05-25: qty 50

## 2021-05-25 MED ORDER — HEPARIN SOD (PORK) LOCK FLUSH 100 UNIT/ML IV SOLN
INTRAVENOUS | Status: AC
  Filled 2021-05-25: qty 5

## 2021-05-25 MED ORDER — IOHEXOL 300 MG/ML  SOLN
100.0000 mL | Freq: Once | INTRAMUSCULAR | Status: AC | PRN
  Administered 2021-05-25: 100 mL via INTRAVENOUS

## 2021-05-25 MED ORDER — HEPARIN SOD (PORK) LOCK FLUSH 100 UNIT/ML IV SOLN
500.0000 [IU] | Freq: Once | INTRAVENOUS | Status: AC
  Administered 2021-05-25: 500 [IU] via INTRAVENOUS

## 2021-06-01 ENCOUNTER — Inpatient Hospital Stay (HOSPITAL_BASED_OUTPATIENT_CLINIC_OR_DEPARTMENT_OTHER): Payer: Managed Care, Other (non HMO) | Admitting: Physician Assistant

## 2021-06-01 ENCOUNTER — Inpatient Hospital Stay: Payer: Managed Care, Other (non HMO) | Attending: Internal Medicine

## 2021-06-01 ENCOUNTER — Other Ambulatory Visit: Payer: Self-pay

## 2021-06-01 ENCOUNTER — Encounter: Payer: Self-pay | Admitting: Physician Assistant

## 2021-06-01 ENCOUNTER — Inpatient Hospital Stay: Payer: Managed Care, Other (non HMO)

## 2021-06-01 VITALS — HR 99 | Temp 97.7°F

## 2021-06-01 VITALS — BP 115/80 | HR 109 | Resp 18 | Ht 68.0 in | Wt 176.7 lb

## 2021-06-01 DIAGNOSIS — Z79899 Other long term (current) drug therapy: Secondary | ICD-10-CM | POA: Insufficient documentation

## 2021-06-01 DIAGNOSIS — C3491 Malignant neoplasm of unspecified part of right bronchus or lung: Secondary | ICD-10-CM

## 2021-06-01 DIAGNOSIS — I2510 Atherosclerotic heart disease of native coronary artery without angina pectoris: Secondary | ICD-10-CM | POA: Diagnosis not present

## 2021-06-01 DIAGNOSIS — K219 Gastro-esophageal reflux disease without esophagitis: Secondary | ICD-10-CM | POA: Insufficient documentation

## 2021-06-01 DIAGNOSIS — I10 Essential (primary) hypertension: Secondary | ICD-10-CM | POA: Insufficient documentation

## 2021-06-01 DIAGNOSIS — F1010 Alcohol abuse, uncomplicated: Secondary | ICD-10-CM | POA: Insufficient documentation

## 2021-06-01 DIAGNOSIS — I3139 Other pericardial effusion (noninflammatory): Secondary | ICD-10-CM | POA: Diagnosis not present

## 2021-06-01 DIAGNOSIS — F1011 Alcohol abuse, in remission: Secondary | ICD-10-CM | POA: Diagnosis not present

## 2021-06-01 DIAGNOSIS — Z5112 Encounter for antineoplastic immunotherapy: Secondary | ICD-10-CM | POA: Diagnosis present

## 2021-06-01 DIAGNOSIS — J90 Pleural effusion, not elsewhere classified: Secondary | ICD-10-CM | POA: Insufficient documentation

## 2021-06-01 DIAGNOSIS — R21 Rash and other nonspecific skin eruption: Secondary | ICD-10-CM | POA: Diagnosis not present

## 2021-06-01 DIAGNOSIS — K589 Irritable bowel syndrome without diarrhea: Secondary | ICD-10-CM | POA: Insufficient documentation

## 2021-06-01 DIAGNOSIS — J432 Centrilobular emphysema: Secondary | ICD-10-CM | POA: Diagnosis not present

## 2021-06-01 DIAGNOSIS — C3431 Malignant neoplasm of lower lobe, right bronchus or lung: Secondary | ICD-10-CM | POA: Insufficient documentation

## 2021-06-01 DIAGNOSIS — C7931 Secondary malignant neoplasm of brain: Secondary | ICD-10-CM | POA: Insufficient documentation

## 2021-06-01 DIAGNOSIS — E039 Hypothyroidism, unspecified: Secondary | ICD-10-CM | POA: Diagnosis not present

## 2021-06-01 DIAGNOSIS — E785 Hyperlipidemia, unspecified: Secondary | ICD-10-CM | POA: Insufficient documentation

## 2021-06-01 DIAGNOSIS — G936 Cerebral edema: Secondary | ICD-10-CM | POA: Diagnosis not present

## 2021-06-01 DIAGNOSIS — I700 Atherosclerosis of aorta: Secondary | ICD-10-CM | POA: Insufficient documentation

## 2021-06-01 DIAGNOSIS — Z7982 Long term (current) use of aspirin: Secondary | ICD-10-CM | POA: Diagnosis not present

## 2021-06-01 DIAGNOSIS — L299 Pruritus, unspecified: Secondary | ICD-10-CM

## 2021-06-01 DIAGNOSIS — Z95828 Presence of other vascular implants and grafts: Secondary | ICD-10-CM

## 2021-06-01 LAB — CMP (CANCER CENTER ONLY)
ALT: 20 U/L (ref 0–44)
AST: 23 U/L (ref 15–41)
Albumin: 4.3 g/dL (ref 3.5–5.0)
Alkaline Phosphatase: 78 U/L (ref 38–126)
Anion gap: 7 (ref 5–15)
BUN: 11 mg/dL (ref 6–20)
CO2: 24 mmol/L (ref 22–32)
Calcium: 9.3 mg/dL (ref 8.9–10.3)
Chloride: 102 mmol/L (ref 98–111)
Creatinine: 0.95 mg/dL (ref 0.61–1.24)
GFR, Estimated: >60 mL/min (ref >60)
Glucose, Bld: 138 mg/dL — ABNORMAL HIGH (ref 70–99)
Potassium: 4.4 mmol/L (ref 3.5–5.1)
Sodium: 133 mmol/L — ABNORMAL LOW (ref 135–145)
Total Bilirubin: 0.6 mg/dL (ref 0.3–1.2)
Total Protein: 7.1 g/dL (ref 6.5–8.1)

## 2021-06-01 LAB — CBC WITH DIFFERENTIAL (CANCER CENTER ONLY)
Abs Immature Granulocytes: 0.04 10*3/uL (ref 0.00–0.07)
Basophils Absolute: 0.1 10*3/uL (ref 0.0–0.1)
Basophils Relative: 1 %
Eosinophils Absolute: 0.2 10*3/uL (ref 0.0–0.5)
Eosinophils Relative: 3 %
HCT: 41 % (ref 39.0–52.0)
Hemoglobin: 13.5 g/dL (ref 13.0–17.0)
Immature Granulocytes: 1 %
Lymphocytes Relative: 17 %
Lymphs Abs: 1.3 10*3/uL (ref 0.7–4.0)
MCH: 28.8 pg (ref 26.0–34.0)
MCHC: 32.9 g/dL (ref 30.0–36.0)
MCV: 87.6 fL (ref 80.0–100.0)
Monocytes Absolute: 0.7 10*3/uL (ref 0.1–1.0)
Monocytes Relative: 9 %
Neutro Abs: 5.4 10*3/uL (ref 1.7–7.7)
Neutrophils Relative %: 69 %
Platelet Count: 228 10*3/uL (ref 150–400)
RBC: 4.68 MIL/uL (ref 4.22–5.81)
RDW: 13.2 % (ref 11.5–15.5)
WBC Count: 7.8 10*3/uL (ref 4.0–10.5)
nRBC: 0 % (ref 0.0–0.2)

## 2021-06-01 LAB — TSH: TSH: 2.263 u[IU]/mL (ref 0.320–4.118)

## 2021-06-01 MED ORDER — SODIUM CHLORIDE 0.9 % IV SOLN
200.0000 mg | Freq: Once | INTRAVENOUS | Status: AC
  Administered 2021-06-01: 200 mg via INTRAVENOUS
  Filled 2021-06-01: qty 200

## 2021-06-01 MED ORDER — HEPARIN SOD (PORK) LOCK FLUSH 100 UNIT/ML IV SOLN
500.0000 [IU] | Freq: Once | INTRAVENOUS | Status: AC | PRN
  Administered 2021-06-01: 500 [IU]

## 2021-06-01 MED ORDER — TRIAMCINOLONE ACETONIDE 0.1 % EX CREA: TOPICAL_CREAM | Freq: Two times a day (BID) | CUTANEOUS | 0 refills | Status: DC | PRN

## 2021-06-01 MED ORDER — SODIUM CHLORIDE 0.9 % IV SOLN: Freq: Once | INTRAVENOUS | Status: AC

## 2021-06-01 MED ORDER — SODIUM CHLORIDE 0.9% FLUSH
10.0000 mL | Freq: Once | INTRAVENOUS | Status: AC
  Administered 2021-06-01: 10 mL

## 2021-06-01 MED ORDER — SODIUM CHLORIDE 0.9% FLUSH
10.0000 mL | INTRAVENOUS | Status: DC | PRN
  Administered 2021-06-01: 10 mL

## 2021-06-07 ENCOUNTER — Other Ambulatory Visit: Payer: Self-pay | Admitting: Internal Medicine

## 2021-06-08 ENCOUNTER — Other Ambulatory Visit: Payer: Self-pay | Admitting: *Deleted

## 2021-06-08 DIAGNOSIS — Z95828 Presence of other vascular implants and grafts: Secondary | ICD-10-CM

## 2021-06-17 ENCOUNTER — Encounter: Payer: Self-pay | Admitting: Physician Assistant

## 2021-06-17 ENCOUNTER — Ambulatory Visit
Admission: RE | Admit: 2021-06-17 | Discharge: 2021-06-17 | Disposition: A | Discharge status: Home / Self Care | Payer: Managed Care, Other (non HMO) | Source: Ambulatory Visit | Attending: Radiation Oncology | Admitting: Radiation Oncology

## 2021-06-17 ENCOUNTER — Encounter: Payer: Self-pay | Admitting: Internal Medicine

## 2021-06-17 ENCOUNTER — Other Ambulatory Visit: Payer: Self-pay

## 2021-06-17 DIAGNOSIS — Z95828 Presence of other vascular implants and grafts: Secondary | ICD-10-CM

## 2021-06-17 DIAGNOSIS — C7931 Secondary malignant neoplasm of brain: Secondary | ICD-10-CM

## 2021-06-17 IMAGING — MR MR HEAD WO/W CM
12 series · 48 of 48 positions shown · IV contrast (multihance)
Comparison: [DATE]. Multiple prior studies as distant as
[DATE].

CLINICAL DATA: Brain metastases. Assess treatment response.
Restaging. Metastatic lung cancer.

EXAM:
MRI HEAD WITHOUT AND WITH CONTRAST
TECHNIQUE: Multiplanar, multiecho pulse sequences of the brain and surrounding
structures were obtained without and with intravenous contrast.
CONTRAST:  15mL MULTIHANCE GADOBENATE DIMEGLUMINE 529 MG/ML IV SOLN

[Series 2: FLAIR · sagittal · 3.0mm · 0.75mm/px · 3 of 39 slices shown (1 of 2)]
[im 1/39]
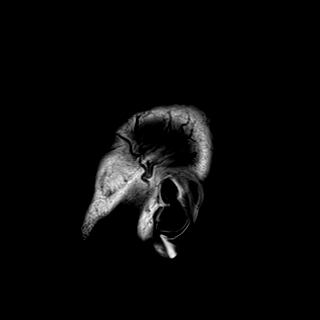
[im 20/39]
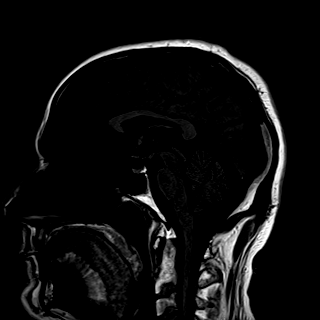
[im 39/39]
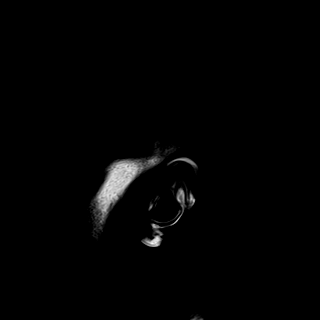

[Series 3: DWI · axial · 3.0mm · 1.50mm/px · z∈[-63,+85]mm · 4 of 78 slices shown (1 of 2)]
[im 1/78]
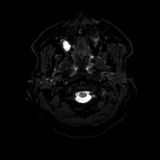
[im 26/78]
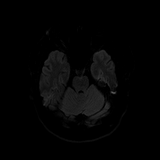
[im 52/78]
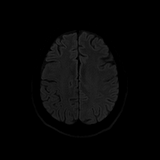
[im 78/78]
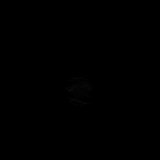

[Series 4: DWI · axial · 3.0mm · 1.50mm/px · z∈[-63,+85]mm · 2 of 39 slices shown (2 of 2)]
[im 1/39]
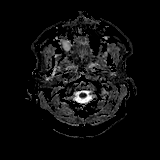
[im 39/39]
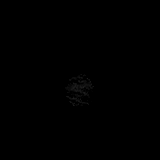

[Series 5: T2 · axial · 5.0mm · 0.57mm/px · 1 of 27 slices shown (1 of 2)]
[im 1/27]
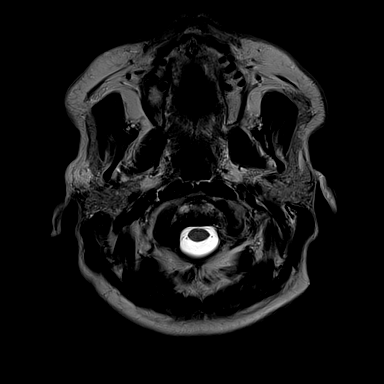

[Series 6: swi_images · axial · 1.5mm · 0.90mm/px · z∈[-61,+81]mm · 5 of 96 slices shown]
[im 1/96]
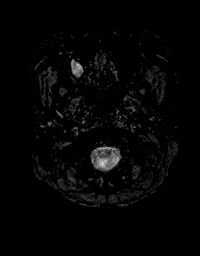
[im 24/96]
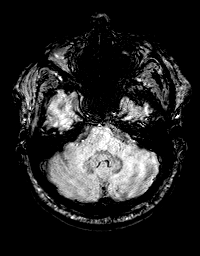
[im 48/96]
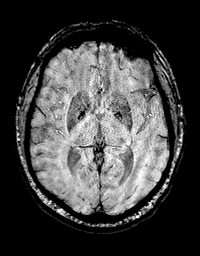
[im 72/96]
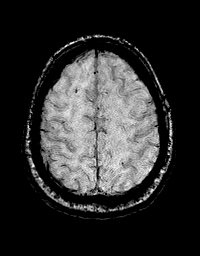
[im 96/96]
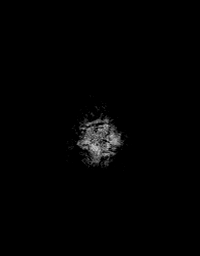

[Series 8: FLAIR · axial · 3.0mm · 0.86mm/px · z∈[-111,+78]mm · 3 of 64 slices shown (2 of 2)]
[im 1/64]
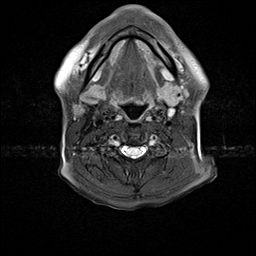
[im 32/64]
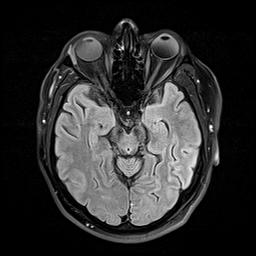
[im 64/64]
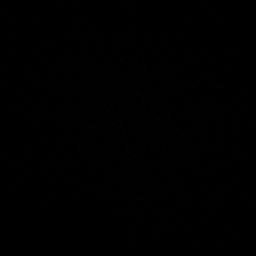

[Series 9: T2 · axial · non-contrast · 1.0mm · 0.86mm/px · z∈[-77,+79]mm · 8 of 160 slices shown (2 of 2)]
[im 1/160]
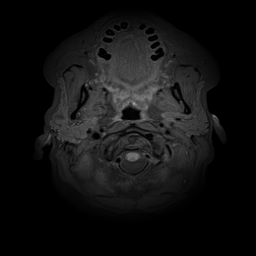
[im 23/160]
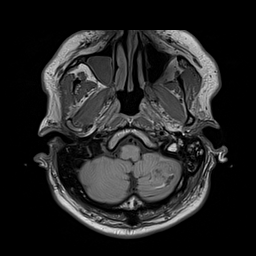
[im 46/160]
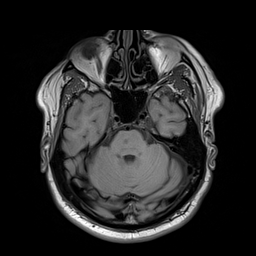
[im 69/160]
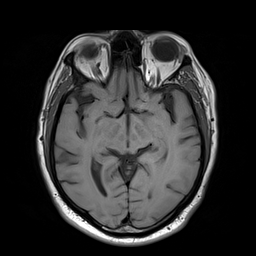
[im 91/160]
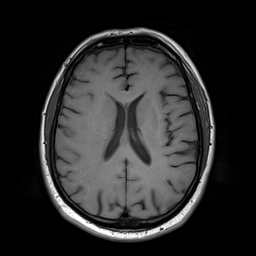
[im 114/160]
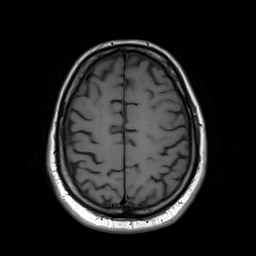
[im 137/160]
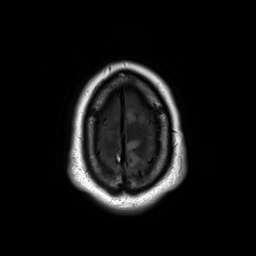
[im 160/160]
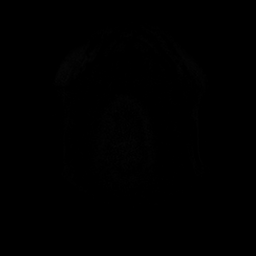

[Series 10: T2 post-contrast · coronal · 3.0mm · 0.57mm/px · 2 of 47 slices shown (1 of 2)]
[im 1/47]
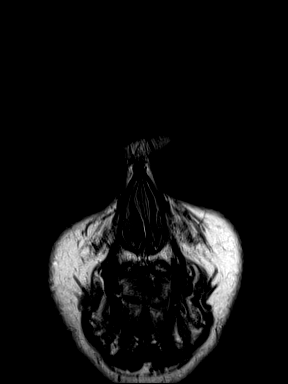
[im 47/47]
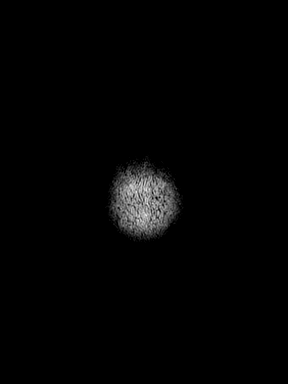

[Series 11: T2 post-contrast · axial · 1.0mm · 0.86mm/px · z∈[-77,+79]mm · 8 of 160 slices shown (2 of 2)]
[im 1/160]
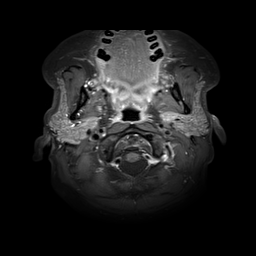
[im 23/160]
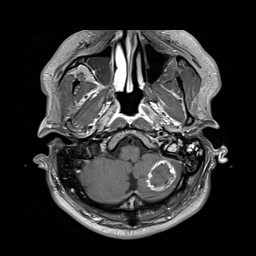
[im 46/160]
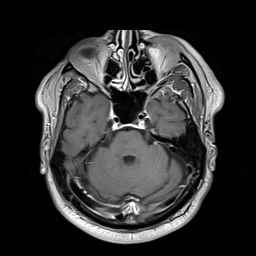
[im 69/160]
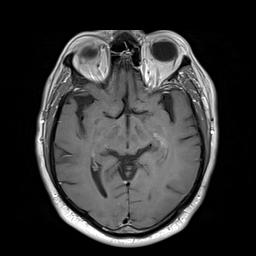
[im 91/160]
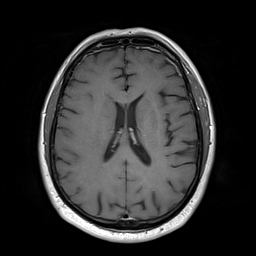
[im 114/160]
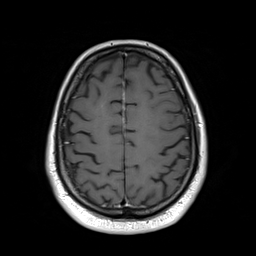
[im 137/160]
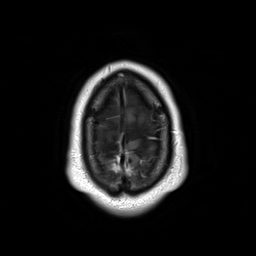
[im 160/160]
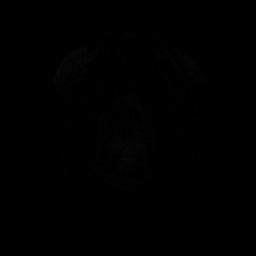

[Series 12: T1 post-contrast · axial · 1.0mm · 0.75mm/px · z∈[-78,+81]mm · 8 of 160 slices shown (1 of 2)]
[im 1/160]
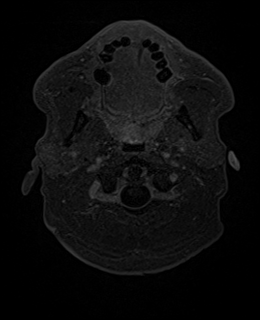
[im 23/160]
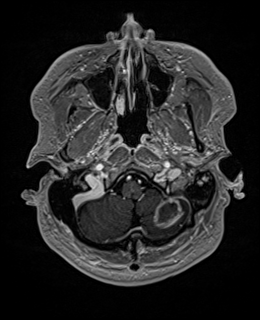
[im 46/160]
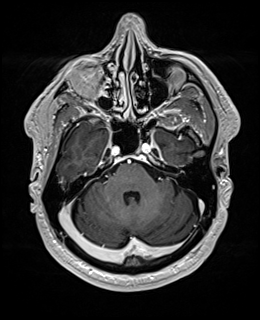
[im 69/160]
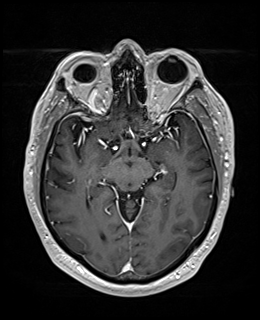
[im 91/160]
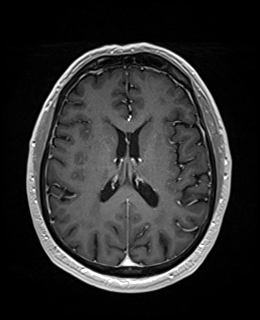
[im 114/160]
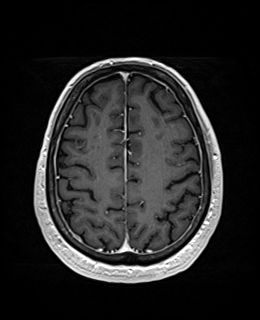
[im 137/160]
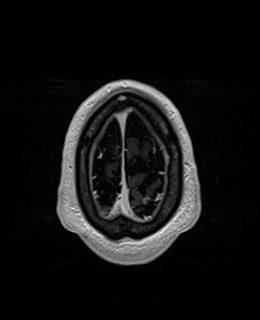
[im 160/160]
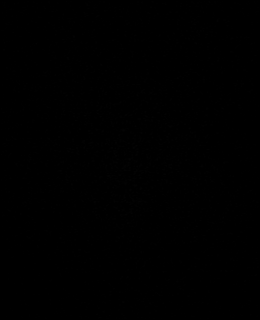

[Series 13: T1 post-contrast · coronal · 3.0mm · 0.57mm/px · 2 of 47 slices shown (2 of 2)]
[im 1/47]
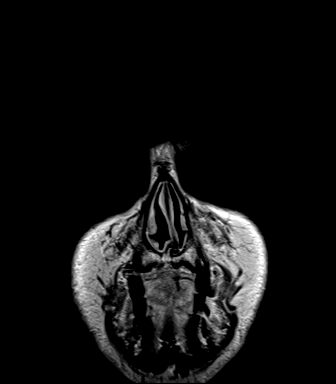
[im 47/47]
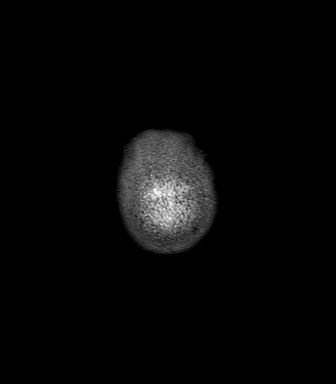

[Series 14: FLAIR post-contrast · sagittal · 3.0mm · 0.75mm/px · 2 of 39 slices shown]
[im 1/39]
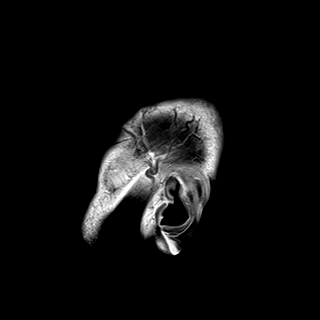
[im 39/39]
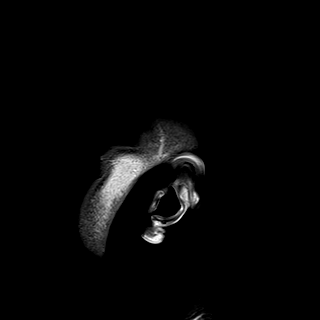

[48 of 48 positions shown; findings below may reference images not displayed]

FINDINGS: Brain: There are no new or progressive lesions.

Left cerebellar peripherally enhancing lesion measures 2.9 x 2.0 x
2.2 cm, unchanged since the previous study. The amount of
surrounding T2 and FLAIR signal within the left cerebellum is
similar. Blood products in the lesion or similar. Thickness of the
peripherally enhancing tissue is similar.

No evidence of residual or recurrent enhancement of the previously
treated right lateral cerebellar lesion.

Punctate focus of enhancement in the left occipital lobe axial image
63 is unchanged.

Punctate focus of enhancement right occipital lobe axial image 75 is
unchanged.

Two adjacent punctate foci of enhancement at the left
parietooccipital junction axial image 82 are unchanged.

Punctate focus of enhancement in the right thalamus axial image 84
is unchanged.

Punctate focus of enhancement in the left posterior parietal lobe
axial image 114 is unchanged.

3 mm focus of enhancement at the right frontoparietal vertex axial
image 120 is unchanged.

6 mm focus of enhancement at the left parietal vertex axial image
134 is unchanged.

Chronic venous angioma in the right frontal lobe appears the same.
Old small vessel infarction in the right pons and right thalamus are
unchanged. Chronic small-vessel ischemic changes of the hemispheric
white matter are unchanged. No hydrocephalus or extra-axial
collection.

Vascular: Major vessels at the base of the brain show flow.

Skull and upper cervical spine: Negative

Sinuses/Orbits: Right maxillary sinus retention cyst. Orbits
negative.

Other: None
IMPRESSION: No new or progressive disease.

Previously treated left cerebellar lesion is stable measuring 2.9 x
2.0 x 2.2 cm.

Numerous other punctate foci of enhancement at the site of
previously treated lesions are stable, as enumerated above.

## 2021-06-17 MED ORDER — GADOBENATE DIMEGLUMINE 529 MG/ML IV SOLN
15.0000 mL | Freq: Once | INTRAVENOUS | Status: AC | PRN
  Administered 2021-06-17: 15 mL via INTRAVENOUS

## 2021-06-17 MED ORDER — SODIUM CHLORIDE 0.9% FLUSH
10.0000 mL | INTRAVENOUS | Status: DC | PRN
  Administered 2021-06-17: 10 mL via INTRAVENOUS

## 2021-06-17 MED ORDER — HEPARIN SOD (PORK) LOCK FLUSH 100 UNIT/ML IV SOLN
500.0000 [IU] | Freq: Once | INTRAVENOUS | Status: AC
  Administered 2021-06-17: 500 [IU] via INTRAVENOUS

## 2021-06-21 ENCOUNTER — Ambulatory Visit
Admission: RE | Admit: 2021-06-21 | Discharge: 2021-06-21 | Disposition: A | Discharge status: Home / Self Care | Payer: Managed Care, Other (non HMO) | Source: Ambulatory Visit | Attending: Radiation Oncology | Admitting: Radiation Oncology

## 2021-06-21 ENCOUNTER — Encounter: Payer: Self-pay | Admitting: Radiation Oncology

## 2021-06-21 ENCOUNTER — Inpatient Hospital Stay: Payer: Managed Care, Other (non HMO)

## 2021-06-21 DIAGNOSIS — C7931 Secondary malignant neoplasm of brain: Secondary | ICD-10-CM

## 2021-06-21 DIAGNOSIS — C3491 Malignant neoplasm of unspecified part of right bronchus or lung: Secondary | ICD-10-CM

## 2021-06-21 NOTE — Progress Notes (Addendum)
Spoke w/ patient, verified identity, and began nursing interview. Patient is doing well and denies any issues at this time.  Meaningful use complete.  Patient reminded of his 2:30pm-06/21/21 telephone appointment w/ Shona Simpson PA-C. I left my extension [phone] in case patient needs anything. Patient verbalized understanding of information.  Patient contact [phone].

## 2021-06-21 NOTE — Progress Notes (Signed)
Radiation Oncology         [phone] ________________________________  Outpatient Follow Up - Conducted via telephone due to current COVID-19 concerns for limiting patient exposure  I spoke with the patient to conduct this consult visit via telephone to spare the patient unnecessary potential exposure in the healthcare setting during the current COVID-19 pandemic. The patient was notified in advance and was offered a [place] meeting to allow for face to face communication but unfortunately reported that they did not have the appropriate resources/technology to support such a visit and instead preferred to proceed with a telephone visit.   Name: Joseph Atkins MRN: [id number]  Date of Service: 06/21/2021  DOB: Sep 13, 1960  Follow Up   Diagnosis:  Stage IV, cT3, N0, M1c, non-small cell lung cancer, adenocarcinoma of the right lower lobe  Prior Radiation:    06/19/2020 SRS Treatment: Each site was treated to 20 Gy in 1 fraction PTV_14 Rt Cereb 40mm PTV_13 Rt Occ 72mm PTV_12 Rt Occ 24mm PTV_11 Lt Front 42mm PTV_10 Rt Par 72mm PTV_9 Lt Par 46mm  03/05/20 SRS Treatment: The following lesions were treated to 20 Gy in 1 fraction PTV2 Rt Temp 43mm PTV3 Lt Occ 33mm PTV4 Lt Occ 26mm PTV5 Rt Temp 23mm PTV6 Lt Occ 46mm PTV7 Rt Par 4mm PTV8 Lt Par 67mm  11/25/19 - 01/09/20: The patient was treated to the disease within the right lung initially to a dose of 60 Gy using a 4 field, 3-D conformal technique. The patient then received a cone down boost treatment for an additional 6 Gy. This yielded a final total dose of 66 Gy.   11/14/19 SRS Treatment:  PTV1 Lt Cerebellum 28 mm, 18 Gy in 1 fraction  Narrative: This is a pleasant 61 y.o. male with a history of stage IV non-small cell lung cancer, adenocarcinoma arising in the right lower lobe with what was originally a solitary brain metastasis.  He was treated with stereotactic radiosurgery [facility]) in July 2021, and definitive chemoradiation to the lung which  he completed in September 2021.  He has been receiving consolidative Keytruda with Dr. Julien Nordmann.  He  had a difficult time with trying to taper his steroids with what resulted in rebound edema following the tapering or discontinuing certain doses, and having to go back to steroids.  Ultimately he was even treated with a Avastin.  He had SRS to new disease sites in November 2021 and in February 2022.  He has also continued on systemic Keytruda and consolidation with Dr. Julien Nordmann.    During his last interval scan, he had a punctate focus that was reviewed by physics and was PTV8. He has followed also with Dr. 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 This caused elevations in his BP. Since his last visit with Dr. Mickeal Skinner he's gotten his BP under control and also had a new prescription for his eye glasses. He underwent MRI on 06/17/21 that showed no new or progressive disease, and stable changes on previously treated areas especially along the left cerebellum. He's contacted today to discuss these results.   On review of systems, the patient reports that he is okay. While he continues to have fatigue, and feel "crummy" at times, he is still able to get around at home. He does spend a lot of time though on the couch. He has been able to spend time with his dad, and his adult son at times too. Since getting his BP under control  and his glasses, he has not had any headaches. No other complaints are verbalized.   PAST MEDICAL HISTORY:  Past Medical History:  Diagnosis Date   Cancer ([place])    lung cancer   Diverticulosis    GERD (gastroesophageal reflux disease)    Hx of small bowel obstruction    Hyperlipidemia    Hypertension    Hypothyroidism    IBS (irritable bowel syndrome)    Substance abuse ([place])    Alcoholic, Drug addition   Thyroid disease     PAST SURGICAL HISTORY: Past Surgical History:  Procedure Laterality Date   arm surgery Right    BRONCHIAL  BRUSHINGS  10/24/2019   Procedure: BRONCHIAL BRUSHINGS;  Surgeon: Collene Gobble, MD;  Location: [facility];  Service: Cardiopulmonary;;  right lower lobe    BRONCHIAL BRUSHINGS  11/05/2019   Procedure: BRONCHIAL BRUSHINGS;  Surgeon: Collene Gobble, MD;  Location: [facility] ENDOSCOPY;  Service: Pulmonary;;   BRONCHIAL NEEDLE ASPIRATION BIOPSY  10/24/2019   Procedure: BRONCHIAL NEEDLE ASPIRATION BIOPSIES;  Surgeon: Collene Gobble, MD;  Location: [facility];  Service: Cardiopulmonary;;   BRONCHIAL NEEDLE ASPIRATION BIOPSY  11/05/2019   Procedure: BRONCHIAL NEEDLE ASPIRATION BIOPSIES;  Surgeon: Collene Gobble, MD;  Location: [facility] ENDOSCOPY;  Service: Pulmonary;;   ENDOBRONCHIAL ULTRASOUND N/A 10/24/2019   Procedure: ENDOBRONCHIAL ULTRASOUND;  Surgeon: Collene Gobble, MD;  Location: [facility];  Service: Cardiopulmonary;  Laterality: N/A;   FINGER SURGERY Right    Middle   IR IMAGING GUIDED PORT INSERTION  11/19/2019   VIDEO BRONCHOSCOPY N/A 10/24/2019   Procedure: VIDEO BRONCHOSCOPY WITHOUT FLUORO;  Surgeon: Collene Gobble, MD;  Location: [facility];  Service: Cardiopulmonary;  Laterality: N/A;   VIDEO BRONCHOSCOPY WITH ENDOBRONCHIAL NAVIGATION N/A 11/05/2019   Procedure: VIDEO BRONCHOSCOPY WITH ENDOBRONCHIAL NAVIGATION;  Surgeon: Collene Gobble, MD;  Location: [facility] ENDOSCOPY;  Service: Pulmonary;  Laterality: N/A;    PAST SOCIAL HISTORY:  Social History   Socioeconomic History   Marital status: Married    Spouse name: Not on file   Number of children: 2   Years of education: Not on file   Highest education level: Not on file  Occupational History   Occupation: supervisor    Employer: [employer]  Tobacco Use   Smoking status: Former    Packs/day: 1.50    Types: Cigarettes   Smokeless tobacco: Former    Types: Nurse, children's Use: Never used  Substance and Sexual Activity   Alcohol use: No    Comment: Alcoholic clean for 6 years   Drug use: No    Comment: Recovering  Drug Addict-clean for 6 years   Sexual activity: Not on file  Other Topics Concern   Not on file  Social History Narrative   Not on file   Social Determinants of Health   Financial Resource Strain: Not on file  Food Insecurity: Not on file  Transportation Needs: Not on file  Physical Activity: Not on file  Stress: Not on file  Social Connections: Not on file  Intimate Partner Violence: Not on file  The patient is married and lives in [place]. He enjoys working on cars, shooting guns, and has been sober from drugs and alcohol for several years.  PAST FAMILY HISTORY: Family History  Problem Relation Age of Onset   Epilepsy Mother    Heart disease Mother    Heart disease Brother    Lung cancer Paternal Uncle     MEDICATIONS  Current Outpatient Medications  Medication Sig Dispense Refill   albuterol (PROVENTIL HFA;VENTOLIN HFA) 108 (90 Base) MCG/ACT inhaler Inhale 2 puffs into the lungs every 6 (six) hours as needed for wheezing or shortness of breath. 1 Inhaler 0   amLODipine (NORVASC) 5 MG tablet Take 1 tablet (5 mg total) by mouth daily. 30 tablet 2   ANORO ELLIPTA 62.5-25 MCG/INH AEPB 1 puff daily.     aspirin EC 81 MG tablet Take 1 tablet (81 mg total) by mouth daily. Swallow whole. 30 tablet 11   cyclobenzaprine (FLEXERIL) 10 MG tablet Take 10 mg by mouth 2 (two) times daily as needed for muscle spasms.     docusate sodium (COLACE) 100 MG capsule Take 100 mg by mouth daily.     DULoxetine (CYMBALTA) 20 MG capsule Take 1 capsule (20 mg total) by mouth 2 (two) times daily. 60 capsule 5   fluticasone furoate-vilanterol (BREO ELLIPTA) 100-25 MCG/INH AEPB Inhale 1 puff into the lungs daily. 30 each 3   levothyroxine (SYNTHROID) 100 MCG tablet TAKE 1 TABLET BY MOUTH EVERY DAY 90 tablet 1   lidocaine-prilocaine (EMLA) cream Apply 1 application topically as needed. 30 g 0   omeprazole (PRILOSEC) 40 MG capsule Take 1 capsule (40 mg total) by mouth daily. 90 capsule 4   ondansetron  (ZOFRAN) 4 MG tablet Take 1 tablet (4 mg total) by mouth every 8 (eight) hours as needed. 40 tablet 2   oxyCODONE-acetaminophen (PERCOCET/ROXICET) 5-325 MG tablet Take 1 tablet by mouth every 4 (four) hours as needed for severe pain. 30 tablet 0   pembrolizumab (KEYTRUDA) 100 MG/4ML SOLN See admin instructions.     polyethylene glycol powder (MIRALAX) powder Take 17 g by mouth daily. 255 g 11   prochlorperazine (COMPAZINE) 10 MG tablet Take 1 tablet (10 mg total) by mouth every 6 (six) hours as needed. 30 tablet 2   triamcinolone cream (KENALOG) 0.1 % Apply topically 2 (two) times daily as needed. 453.6 g 0   TRULANCE 3 MG TABS Take 1 tablet by mouth daily.     zolpidem (AMBIEN CR) 12.5 MG CR tablet Take 1 tablet (12.5 mg total) by mouth at bedtime as needed for sleep. 30 tablet 5   No current facility-administered medications for this encounter.    ALLERGIES:  Allergies  Allergen Reactions   Penicillins Other (See Comments)    Childhood allergy.  Has patient had a PCN reaction causing immediate rash, facial/tongue/throat swelling, SOB or lightheadedness with hypotension: unknown Has patient had a PCN reaction causing severe rash involving mucus membranes or skin necrosis: unknown Has patient had a PCN reaction that required hospitalization: unknown Has patient had a PCN reaction occurring within the last 10 years: no If all of the above answers are "NO", then may proceed with Cephalosporin use.    Physical exam: Unable to assess given encounter type.  Impression/Plan: 1. Stage IV, cT3, N0, M1c, non-small cell lung cancer, adenocarcinoma of the right lower lobe with progressive brain disease.  We reviewed the stable findings and discussed the rationale to start moving his scans to 4 month intervals, and if stable next February 2024, consider moving to 6 month intervals. He will also continue with Keytruda under the care of Dr. Julien Nordmann through October 2023.  2. Edema, Infarct, and chronic  vessel disease on imaging. We appreciate Dr. Renda Rolls input and will see if he needs to follow up next week with Dr. Mickeal Skinner or push out his next appt to the time of  his next MRI.     Given current concerns for patient exposure during the COVID-19 pandemic, this encounter was conducted via telephone.  The patient has provided two factor identification and has given verbal consent for this type of encounter and has been advised to only accept a meeting of this type in a secure network environment. The time spent during this encounter was 45 minutes including preparation, discussion, and coordination of the patient's care. The attendants for this meeting include Hayden Pedro  and PATTY LOPEZGARCIA and his wife Kashton Mcartor.   Hayden Pedro was located at [facility] Radiation Oncology Department.  Horrace Hanak and his wife Vaughan Basta were located at home    Carola Rhine, [facility]

## 2021-06-22 ENCOUNTER — Inpatient Hospital Stay: Payer: Managed Care, Other (non HMO)

## 2021-06-22 ENCOUNTER — Inpatient Hospital Stay (HOSPITAL_BASED_OUTPATIENT_CLINIC_OR_DEPARTMENT_OTHER): Payer: Managed Care, Other (non HMO) | Admitting: Internal Medicine

## 2021-06-22 ENCOUNTER — Other Ambulatory Visit: Payer: Self-pay

## 2021-06-22 VITALS — HR 95

## 2021-06-22 VITALS — BP 126/80 | HR 101 | Temp 96.4°F | Resp 18 | Ht 68.0 in | Wt 181.3 lb

## 2021-06-22 DIAGNOSIS — C3431 Malignant neoplasm of lower lobe, right bronchus or lung: Secondary | ICD-10-CM | POA: Diagnosis not present

## 2021-06-22 DIAGNOSIS — Z5112 Encounter for antineoplastic immunotherapy: Secondary | ICD-10-CM

## 2021-06-22 DIAGNOSIS — C3491 Malignant neoplasm of unspecified part of right bronchus or lung: Secondary | ICD-10-CM

## 2021-06-22 DIAGNOSIS — Z95828 Presence of other vascular implants and grafts: Secondary | ICD-10-CM

## 2021-06-22 LAB — CBC WITH DIFFERENTIAL (CANCER CENTER ONLY)
Abs Immature Granulocytes: 0.06 10*3/uL (ref 0.00–0.07)
Basophils Absolute: 0.1 10*3/uL (ref 0.0–0.1)
Basophils Relative: 1 %
Eosinophils Absolute: 0.3 10*3/uL (ref 0.0–0.5)
Eosinophils Relative: 3 %
HCT: 40.7 % (ref 39.0–52.0)
Hemoglobin: 13.7 g/dL (ref 13.0–17.0)
Immature Granulocytes: 1 %
Lymphocytes Relative: 17 %
Lymphs Abs: 1.5 10*3/uL (ref 0.7–4.0)
MCH: 29.5 pg (ref 26.0–34.0)
MCHC: 33.7 g/dL (ref 30.0–36.0)
MCV: 87.5 fL (ref 80.0–100.0)
Monocytes Absolute: 0.7 10*3/uL (ref 0.1–1.0)
Monocytes Relative: 9 %
Neutro Abs: 5.9 10*3/uL (ref 1.7–7.7)
Neutrophils Relative %: 69 %
Platelet Count: 229 10*3/uL (ref 150–400)
RBC: 4.65 MIL/uL (ref 4.22–5.81)
RDW: 13.3 % (ref 11.5–15.5)
WBC Count: 8.5 10*3/uL (ref 4.0–10.5)
nRBC: 0 % (ref 0.0–0.2)

## 2021-06-22 LAB — CMP (CANCER CENTER ONLY)
ALT: 18 U/L (ref 0–44)
AST: 19 U/L (ref 15–41)
Albumin: 4.2 g/dL (ref 3.5–5.0)
Alkaline Phosphatase: 84 U/L (ref 38–126)
Anion gap: 6 (ref 5–15)
BUN: 14 mg/dL (ref 6–20)
CO2: 25 mmol/L (ref 22–32)
Calcium: 9.3 mg/dL (ref 8.9–10.3)
Chloride: 103 mmol/L (ref 98–111)
Creatinine: 0.97 mg/dL (ref 0.61–1.24)
GFR, Estimated: >60 mL/min (ref >60)
Glucose, Bld: 121 mg/dL — ABNORMAL HIGH (ref 70–99)
Potassium: 4.2 mmol/L (ref 3.5–5.1)
Sodium: 134 mmol/L — ABNORMAL LOW (ref 135–145)
Total Bilirubin: 0.6 mg/dL (ref 0.3–1.2)
Total Protein: 7.2 g/dL (ref 6.5–8.1)

## 2021-06-22 LAB — TSH: TSH: 4.57 u[IU]/mL — ABNORMAL HIGH (ref 0.320–4.118)

## 2021-06-22 MED ORDER — SODIUM CHLORIDE 0.9 % IV SOLN: Freq: Once | INTRAVENOUS | Status: AC

## 2021-06-22 MED ORDER — SODIUM CHLORIDE 0.9% FLUSH
10.0000 mL | INTRAVENOUS | Status: DC | PRN
  Administered 2021-06-22: 10 mL

## 2021-06-22 MED ORDER — SODIUM CHLORIDE 0.9 % IV SOLN
200.0000 mg | Freq: Once | INTRAVENOUS | Status: AC
  Administered 2021-06-22: 200 mg via INTRAVENOUS
  Filled 2021-06-22: qty 200

## 2021-06-22 MED ORDER — HEPARIN SOD (PORK) LOCK FLUSH 100 UNIT/ML IV SOLN
500.0000 [IU] | Freq: Once | INTRAVENOUS | Status: AC | PRN
  Administered 2021-06-22: 500 [IU]

## 2021-06-22 NOTE — Progress Notes (Signed)
[place] [phone]   [phone]  OFFICE PROGRESS NOTE  [name], NP [name] [place] [address]  DIAGNOSIS: Stage IV (T3, N0, M1C) non-small cell lung cancer, adenocarcinoma.  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 He was diagnosed in July 2021.   Molecular Biomarkers:  MSI-High DETECTED Pembrolizumab Atezolizumab, Avelumab, Cemiplimab, Dostarlimab, Durvalumab, Ipilimumab, Nivolumab   STK11Splice Site SNV 3.8% Everolimus, Temsirolimus Yes   [id number] 1.7% Binimetinib Yes   [id number] 0.4%   Niraparib, Olaparib, Rucaparib, Talazoparib, Tazemetostat Yes   PRIOR THERAPY:  1) SRS to the solitary brain metastasis under the care of Dr. Lisbeth Renshaw. Last treatment 11/14/19. 2) Weekly concurrent chemoradiation with carboplatin for an AUC of 2, paclitaxel 45 mg/m2.  First dose expected on 11/25/2019. Status post 7 cycles, last dose was giving 01/06/2020 with partial response.    CURRENT THERAPY:  1)  Immunotherapy with Keytruda 200 mg IV every 3 weeks.  First dose February 10, 2020 for a patient with MSI high.  Status post 22  cycles. 2) Avastin 15 mg/KG every 3 weeks.  First dose today for the vasogenic edema of the brain.S/P 7 cycles.  INTERVAL HISTORY: Joseph Atkins 61 y.o. male returns to the clinic today for follow-up visit accompanied by his wife.  The patient is feeling fine today with no concerning complaints.  He gained few pounds since his last visit.  He denied having any current chest pain, shortness of breath but has mild cough with no hemoptysis.  He denied having any fever or chills.  He has no nausea, vomiting, diarrhea or constipation.  He denied having any headache or visual changes.  He has been tolerating his treatment with Keytruda fairly well.  He had MRI of the brain performed recently that was unremarkable  for any disease progression.  The patient is here today for evaluation before starting cycle #23 of his treatment.  MEDICAL HISTORY: Past Medical History:  Diagnosis Date   Cancer ([place])    lung cancer   Diverticulosis    GERD (gastroesophageal reflux disease)    Hx of small bowel obstruction    Hyperlipidemia    Hypertension    Hypothyroidism    IBS (irritable bowel syndrome)    Substance abuse ([place])    Alcoholic, Drug addition   Thyroid disease     ALLERGIES:  is allergic to penicillins.  MEDICATIONS:  Current Outpatient Medications  Medication Sig Dispense Refill   albuterol (PROVENTIL HFA;VENTOLIN HFA) 108 (90 Base) MCG/ACT inhaler Inhale 2 puffs into the lungs every 6 (six) hours as needed for wheezing or shortness of breath. 1 Inhaler 0   amLODipine (NORVASC) 5 MG tablet Take 1 tablet (5 mg total) by mouth daily. 30 tablet 2   ANORO ELLIPTA 62.5-25 MCG/INH AEPB 1 puff daily.     aspirin EC 81 MG tablet Take 1 tablet (81 mg total) by mouth daily. Swallow whole. 30 tablet 11   cyclobenzaprine (FLEXERIL) 10 MG tablet Take 10 mg by mouth 2 (two) times daily as needed for muscle spasms.     docusate sodium (COLACE) 100 MG capsule Take 100 mg by mouth daily.     DULoxetine (CYMBALTA) 20 MG capsule Take 1 capsule (20 mg total) by mouth 2 (two) times daily. 60 capsule 5   fluticasone furoate-vilanterol (BREO ELLIPTA) 100-25 MCG/INH AEPB Inhale 1 puff into the  lungs daily. 30 each 3   levothyroxine (SYNTHROID) 100 MCG tablet TAKE 1 TABLET BY MOUTH EVERY DAY 90 tablet 1   lidocaine-prilocaine (EMLA) cream Apply 1 application topically as needed. 30 g 0   omeprazole (PRILOSEC) 40 MG capsule Take 1 capsule (40 mg total) by mouth daily. 90 capsule 4   ondansetron (ZOFRAN) 4 MG tablet Take 1 tablet (4 mg total) by mouth every 8 (eight) hours as needed. 40 tablet 2   oxyCODONE-acetaminophen (PERCOCET/ROXICET) 5-325 MG tablet Take 1 tablet by mouth every 4 (four) hours as needed for severe  pain. 30 tablet 0   pembrolizumab (KEYTRUDA) 100 MG/4ML SOLN See admin instructions.     polyethylene glycol powder (MIRALAX) powder Take 17 g by mouth daily. 255 g 11   prochlorperazine (COMPAZINE) 10 MG tablet Take 1 tablet (10 mg total) by mouth every 6 (six) hours as needed. 30 tablet 2   triamcinolone cream (KENALOG) 0.1 % Apply topically 2 (two) times daily as needed. 453.6 g 0   TRULANCE 3 MG TABS Take 1 tablet by mouth daily.     zolpidem (AMBIEN CR) 12.5 MG CR tablet Take 1 tablet (12.5 mg total) by mouth at bedtime as needed for sleep. 30 tablet 5   No current facility-administered medications for this visit.    SURGICAL HISTORY:  Past Surgical History:  Procedure Laterality Date   arm surgery Right    BRONCHIAL BRUSHINGS  10/24/2019   Procedure: BRONCHIAL BRUSHINGS;  Surgeon: Collene Gobble, MD;  Location: [facility] ENDOSCOPY;  Service: Cardiopulmonary;;  right lower lobe    BRONCHIAL BRUSHINGS  11/05/2019   Procedure: BRONCHIAL BRUSHINGS;  Surgeon: Collene Gobble, MD;  Location[facility] ENDOSCOPY;  Service: Pulmonary;;   BRONCHIAL NEEDLE ASPIRATION BIOPSY  10/24/2019   Procedure: BRONCHIAL NEEDLE ASPIRATION BIOPSIES;  Surgeon: Collene Gobble, MD;  Location: [facility];  Service: Cardiopulmonary;;   BRONCHIAL NEEDLE ASPIRATION BIOPSY  11/05/2019   Procedure: BRONCHIAL NEEDLE ASPIRATION BIOPSIES;  Surgeon: Collene Gobble, MD;  Location: [facility] ENDOSCOPY;  Service: Pulmonary;;   ENDOBRONCHIAL ULTRASOUND N/A 10/24/2019   Procedure: ENDOBRONCHIAL ULTRASOUND;  Surgeon: Collene Gobble, MD;  Location: [facility];  Service: Cardiopulmonary;  Laterality: N/A;   FINGER SURGERY Right    Middle   IR IMAGING GUIDED PORT INSERTION  11/19/2019   VIDEO BRONCHOSCOPY N/A 10/24/2019   Procedure: VIDEO BRONCHOSCOPY WITHOUT FLUORO;  Surgeon: Collene Gobble, MD;  Location: [place];  Service: Cardiopulmonary;  Laterality: N/A;   VIDEO BRONCHOSCOPY WITH ENDOBRONCHIAL NAVIGATION N/A 11/05/2019   Procedure:  VIDEO BRONCHOSCOPY WITH ENDOBRONCHIAL NAVIGATION;  Surgeon: Collene Gobble, MD;  Location: [facility] ENDOSCOPY;  Service: Pulmonary;  Laterality: N/A;    REVIEW OF SYSTEMS:  A comprehensive review of systems was negative except for: Constitutional: positive for fatigue Integument/breast: positive for dryness, pruritus, and rash   PHYSICAL EXAMINATION: General appearance: alert, cooperative, fatigued, and no distress Head: Normocephalic, without obvious abnormality, atraumatic Neck: no adenopathy, no JVD, supple, symmetrical, trachea midline, and thyroid not enlarged, symmetric, no tenderness/mass/nodules Lymph nodes: Cervical, supraclavicular, and axillary nodes normal. Resp: clear to auscultation bilaterally Back: symmetric, no curvature. ROM normal. No CVA tenderness. Cardio: regular rate and rhythm, S1, S2 normal, no murmur, click, rub or gallop GI: soft, non-tender; bowel sounds normal; no masses,  no organomegaly Extremities: extremities normal, atraumatic, no cyanosis or edema  ECOG PERFORMANCE STATUS: 1 - Symptomatic but completely ambulatory  Blood pressure 126/80, pulse (!) 101, temperature (!) 96.4 F (35.8 C), temperature source  Tympanic, resp. rate 18, height _0  (1.727 m), weight 181 lb 4.8 oz (82.2 kg), SpO2 99 %.  LABORATORY DATA: Lab Results  Component Value Date   WBC 8.5 06/22/2021   HGB 13.7 06/22/2021   HCT 40.7 06/22/2021   MCV 87.5 06/22/2021   PLT 229 06/22/2021      Chemistry      Component Value Date/Time   NA 133 (L) 06/01/2021 0858   NA 141 03/08/2017 1707   K 4.4 06/01/2021 0858   CL 102 06/01/2021 0858   CO2 24 06/01/2021 0858   BUN 11 06/01/2021 0858   BUN 12 03/08/2017 1707   CREATININE 0.95 06/01/2021 0858      Component Value Date/Time   CALCIUM 9.3 06/01/2021 0858   ALKPHOS 78 06/01/2021 0858   AST 23 06/01/2021 0858   ALT 20 06/01/2021 0858   BILITOT 0.6 06/01/2021 0858       RADIOGRAPHIC STUDIES: CT Chest W Contrast  Result Date:  05/25/2021 CLINICAL DATA:  Primary Cancer Type: Lung Imaging Indication: Assess response to therapy Interval therapy since last imaging? Yes Initial Cancer Diagnosis Date: 11/05/2019; Established by: Biopsy-proven Detailed Pathology: Stage IV non-small cell lung cancer, adenocarcinoma. Primary Tumor location: Right lower lobe. Solitary brain metastasis. Surgeries: No. Chemotherapy: Yes; Ongoing? No; Most recent administration: 01/06/2020 Immunotherapy?  Yes; Type: Keytruda; Ongoing? Yes Radiation therapy? Yes Date Range: 03/05/2020; Target: Brain metastasis Date Range: 11/25/2019 - 01/09/2020; Target: Right lung Date Range: 11/14/2019; Target: Cerebellar metastasis EXAM: CT CHEST, ABDOMEN, AND PELVIS WITH CONTRAST TECHNIQUE: Multidetector CT imaging of the chest, abdomen and pelvis was performed following the standard protocol during bolus administration of intravenous contrast. RADIATION DOSE REDUCTION: This exam was performed according to the departmental dose-optimization program which includes automated exposure control, adjustment of the mA and/or kV according to patient size and/or use of iterative reconstruction technique. CONTRAST:  160m OMNIPAQUE IOHEXOL 300 MG/ML  SOLN COMPARISON:  Most recent CT chest, abdomen, and pelvis 03/05/2021. 11/13/2019 PET-CT. FINDINGS: CT CHEST FINDINGS Cardiovascular: Right chest port catheter. Aortic atherosclerosis. No significant vascular findings. Left and right coronary artery calcifications. Normal heart size. Unchanged trace pericardial effusion. Mediastinum/Nodes: No enlarged mediastinal, hilar, or axillary lymph nodes. Thyroid gland, trachea, and esophagus demonstrate no significant findings. Lungs/Pleura: Moderate centrilobular and paraseptal emphysema. Diffuse bilateral bronchial wall thickening. Unchanged post treatment appearance of the right chest, with infrahilar consolidation and fibrosis of the central right middle lobe and superior segment right lower lobe  (series 6, image 107). Unchanged trace right pleural effusion. Musculoskeletal: No chest wall mass or suspicious osseous lesions identified. CT ABDOMEN PELVIS FINDINGS Hepatobiliary: No solid liver abnormality is seen. Unchanged small low-attenuation lesion of the central left lobe of the liver (series 2, image 50). No gallstones, gallbladder wall thickening, or biliary dilatation. Pancreas: Unremarkable. No pancreatic ductal dilatation or surrounding inflammatory changes. Spleen: Normal in size without significant abnormality. Adrenals/Urinary Tract: Adrenal glands are unremarkable. Kidneys are normal, without renal calculi, solid lesion, or hydronephrosis. Bladder is unremarkable. Stomach/Bowel: Stomach is within normal limits. Appendix appears normal. No evidence of bowel wall thickening, distention, or inflammatory changes. Generally large burden of stool throughout the colon and rectum Vascular/Lymphatic: Aortic atherosclerosis. Unchanged long segment occlusion of the left common iliac, internal iliac, and external iliac arteries from their origins, the left common femoral artery reconstituted by the inferior epigastric artery. No enlarged abdominal or pelvic lymph nodes. Reproductive: No mass or other abnormality. Other: No abdominal wall hernia or abnormality. No ascites. Musculoskeletal: No acute  osseous findings. IMPRESSION: 1. Unchanged post treatment appearance of the right chest, with infrahilar consolidation and fibrosis of the central right middle lobe and superior segment right lower lobe. 2. No evidence of recurrent or metastatic disease in the chest, abdomen, or pelvis. 3. Unchanged small low-attenuation lesion of the central left lobe of the liver, incompletely characterized although most likely a cyst or hemangioma. Continued attention on follow-up. 4. Unchanged long segment occlusion of the left common iliac, internal iliac, and external iliac arteries from their origins, the left common femoral  artery reconstituted by the inferior epigastric artery. 5. Emphysema. 6. Coronary artery disease. Aortic Atherosclerosis (ICD10-I70.0) and Emphysema (ICD10-J43.9). Electronically Signed   By: Delanna Ahmadi M.D.   On: 05/25/2021 11:54   MR Brain W Wo Contrast  Result Date: 06/17/2021 CLINICAL DATA:  Brain metastases. Assess treatment response. Restaging. Metastatic lung cancer. EXAM: MRI HEAD WITHOUT AND WITH CONTRAST TECHNIQUE: Multiplanar, multiecho pulse sequences of the brain and surrounding structures were obtained without and with intravenous contrast. CONTRAST:  37m MULTIHANCE GADOBENATE DIMEGLUMINE 529 MG/ML IV SOLN COMPARISON:  03/26/2021. Multiple prior studies as distant as 10/18/2019. FINDINGS: Brain: There are no new or progressive lesions. Left cerebellar peripherally enhancing lesion measures 2.9 x 2.0 x 2.2 cm, unchanged since the previous study. The amount of surrounding T2 and FLAIR signal within the left cerebellum is similar. Blood products in the lesion or similar. Thickness of the peripherally enhancing tissue is similar. No evidence of residual or recurrent enhancement of the previously treated right lateral cerebellar lesion. Punctate focus of enhancement in the left occipital lobe axial image 63 is unchanged. Punctate focus of enhancement right occipital lobe axial image 75 is unchanged. Two adjacent punctate foci of enhancement at the left parietooccipital junction axial image 82 are unchanged. Punctate focus of enhancement in the right thalamus axial image 84 is unchanged. Punctate focus of enhancement in the left posterior parietal lobe axial image 114 is unchanged. 3 mm focus of enhancement at the right frontoparietal vertex axial image 120 is unchanged. 6 mm focus of enhancement at the left parietal vertex axial image 134 is unchanged. Chronic venous angioma in the right frontal lobe appears the same. Old small vessel infarction in the right pons and right thalamus are unchanged.  Chronic small-vessel ischemic changes of the hemispheric white matter are unchanged. No hydrocephalus or extra-axial collection. Vascular: Major vessels at the base of the brain show flow. Skull and upper cervical spine: Negative Sinuses/Orbits: Right maxillary sinus retention cyst. Orbits negative. Other: None IMPRESSION: No new or progressive disease. Previously treated left cerebellar lesion is stable measuring 2.9 x 2.0 x 2.2 cm. Numerous other punctate foci of enhancement at the site of previously treated lesions are stable, as enumerated above. Electronically Signed   By: MNelson ChimesM.D.   On: 06/17/2021 20:11   CT Abdomen Pelvis W Contrast  Result Date: 05/25/2021 CLINICAL DATA:  Primary Cancer Type: Lung Imaging Indication: Assess response to therapy Interval therapy since last imaging? Yes Initial Cancer Diagnosis Date: 11/05/2019; Established by: Biopsy-proven Detailed Pathology: Stage IV non-small cell lung cancer, adenocarcinoma. Primary Tumor location: Right lower lobe. Solitary brain metastasis. Surgeries: No. Chemotherapy: Yes; Ongoing? No; Most recent administration: 01/06/2020 Immunotherapy?  Yes; Type: Keytruda; Ongoing? Yes Radiation therapy? Yes Date Range: 03/05/2020; Target: Brain metastasis Date Range: 11/25/2019 - 01/09/2020; Target: Right lung Date Range: 11/14/2019; Target: Cerebellar metastasis EXAM: CT CHEST, ABDOMEN, AND PELVIS WITH CONTRAST TECHNIQUE: Multidetector CT imaging of the chest, abdomen and pelvis was performed  following the standard protocol during bolus administration of intravenous contrast. RADIATION DOSE REDUCTION: This exam was performed according to the departmental dose-optimization program which includes automated exposure control, adjustment of the mA and/or kV according to patient size and/or use of iterative reconstruction technique. CONTRAST:  158m OMNIPAQUE IOHEXOL 300 MG/ML  SOLN COMPARISON:  Most recent CT chest, abdomen, and pelvis 03/05/2021.  11/13/2019 PET-CT. FINDINGS: CT CHEST FINDINGS Cardiovascular: Right chest port catheter. Aortic atherosclerosis. No significant vascular findings. Left and right coronary artery calcifications. Normal heart size. Unchanged trace pericardial effusion. Mediastinum/Nodes: No enlarged mediastinal, hilar, or axillary lymph nodes. Thyroid gland, trachea, and esophagus demonstrate no significant findings. Lungs/Pleura: Moderate centrilobular and paraseptal emphysema. Diffuse bilateral bronchial wall thickening. Unchanged post treatment appearance of the right chest, with infrahilar consolidation and fibrosis of the central right middle lobe and superior segment right lower lobe (series 6, image 107). Unchanged trace right pleural effusion. Musculoskeletal: No chest wall mass or suspicious osseous lesions identified. CT ABDOMEN PELVIS FINDINGS Hepatobiliary: No solid liver abnormality is seen. Unchanged small low-attenuation lesion of the central left lobe of the liver (series 2, image 50). No gallstones, gallbladder wall thickening, or biliary dilatation. Pancreas: Unremarkable. No pancreatic ductal dilatation or surrounding inflammatory changes. Spleen: Normal in size without significant abnormality. Adrenals/Urinary Tract: Adrenal glands are unremarkable. Kidneys are normal, without renal calculi, solid lesion, or hydronephrosis. Bladder is unremarkable. Stomach/Bowel: Stomach is within normal limits. Appendix appears normal. No evidence of bowel wall thickening, distention, or inflammatory changes. Generally large burden of stool throughout the colon and rectum Vascular/Lymphatic: Aortic atherosclerosis. Unchanged long segment occlusion of the left common iliac, internal iliac, and external iliac arteries from their origins, the left common femoral artery reconstituted by the inferior epigastric artery. No enlarged abdominal or pelvic lymph nodes. Reproductive: No mass or other abnormality. Other: No abdominal wall  hernia or abnormality. No ascites. Musculoskeletal: No acute osseous findings. IMPRESSION: 1. Unchanged post treatment appearance of the right chest, with infrahilar consolidation and fibrosis of the central right middle lobe and superior segment right lower lobe. 2. No evidence of recurrent or metastatic disease in the chest, abdomen, or pelvis. 3. Unchanged small low-attenuation lesion of the central left lobe of the liver, incompletely characterized although most likely a cyst or hemangioma. Continued attention on follow-up. 4. Unchanged long segment occlusion of the left common iliac, internal iliac, and external iliac arteries from their origins, the left common femoral artery reconstituted by the inferior epigastric artery. 5. Emphysema. 6. Coronary artery disease. Aortic Atherosclerosis (ICD10-I70.0) and Emphysema (ICD10-J43.9). Electronically Signed   By: ADelanna AhmadiM.D.   On: 05/25/2021 11:54     ASSESSMENT AND PLAN: This is a very pleasant 61years old white male with stage IV (T3, N0, M1c) non-small cell lung cancer, adenocarcinoma with MSI high presented with right lower lobe/infrahilar mass in addition to solitary brain metastasis in the left cerebellum diagnosed in July 2021. He is status post SRS to the solitary brain metastasis. The patient completed a course of concurrent chemoradiation with weekly carboplatin and paclitaxel.  He tolerated the treatment well except for fatigue and mild odynophagia. The patient has MSI high and I recommended for him treatment with immunotherapy with single agent Keytruda 200 mg IV every 3 weeks for a total of 2 years unless the patient has unacceptable toxicity or disease progression. He is status post 22 cycles of treatment with Keytruda.  He also received 7 cycles of Avastin for the vasogenic edema in the brain.  Avastin will be on hold for now unless needed in the future. The patient has been tolerating his treatment well with no concerning adverse  effects. I recommended for him to proceed with cycle #23 today as planned. I will see him back for follow-up visit in 3 weeks for evaluation before starting cycle #24. For the hypertension he was advised to monitor his blood pressure closely at home. The patient was advised to call immediately if he has any other concerning symptoms in the interval. The patient voices understanding of current disease status and treatment options and is in agreement with the current care plan.  All questions were answered. The patient knows to call the clinic with any problems, questions or concerns. We can certainly see the patient much sooner if necessary.  Disclaimer: This note was dictated with voice recognition software. Similar sounding words can inadvertently be transcribed and may not be corrected upon review.

## 2021-06-22 NOTE — Patient Instructions (Signed)
[facility]   Discharge Instructions: Thank you for choosing [facility] to provide your oncology and hematology care.   If you have a lab appointment with the [facility], please go directly to the [facility] and check in at the registration area.   Wear comfortable clothing and clothing appropriate for easy access to any Portacath or PICC line.   We strive to give you quality time with your provider. You may need to reschedule your appointment if you arrive late (15 or more minutes).  Arriving late affects you and other patients whose appointments are after yours.  Also, if you miss three or more appointments without notifying the office, you may be dismissed from the clinic at the providers discretion.      For prescription refill requests, have your pharmacy contact our office and allow 72 hours for refills to be completed.    Today you received the following chemotherapy and/or immunotherapy agents: pembrolizumab      To help prevent nausea and vomiting after your treatment, we encourage you to take your nausea medication as directed.  BELOW ARE SYMPTOMS THAT SHOULD BE REPORTED IMMEDIATELY: *FEVER GREATER THAN 100.4 F (38 C) OR HIGHER *CHILLS OR SWEATING *NAUSEA AND VOMITING THAT IS NOT CONTROLLED WITH YOUR NAUSEA MEDICATION *UNUSUAL SHORTNESS OF BREATH *UNUSUAL BRUISING OR BLEEDING *URINARY PROBLEMS (pain or burning when urinating, or frequent urination) *BOWEL PROBLEMS (unusual diarrhea, constipation, pain near the anus) TENDERNESS IN MOUTH AND THROAT WITH OR WITHOUT PRESENCE OF ULCERS (sore throat, sores in mouth, or a toothache) UNUSUAL RASH, SWELLING OR PAIN  UNUSUAL VAGINAL DISCHARGE OR ITCHING   Items with * indicate a potential emergency and should be followed up as soon as possible or go to the Emergency Department if any problems should occur.  Please show the CHEMOTHERAPY ALERT CARD or IMMUNOTHERAPY ALERT CARD at  check-in to the Emergency Department and triage nurse.  Should you have questions after your visit or need to cancel or reschedule your appointment, please contact [facility]  Dept: [phone]  and follow the prompts.  Office hours are 8:00 a.m. to 4:30 p.m. Monday - Friday. Please note that voicemails left after 4:00 p.m. may not be returned until the following business day.  We are closed weekends and major holidays. You have access to a nurse at all times for urgent questions. Please call the main number to the clinic Dept: [phone] and follow the prompts.   For any non-urgent questions, you may also contact your provider using MyChart. We now offer e-Visits for anyone 40 and older to request care online for non-urgent symptoms. For details visit [email or web address].   Also download the MyChart app! Go to the app store, search "MyChart", open the app, select [facility], and log in with your MyChart username and password.  Due to Covid, a mask is required upon entering the hospital/clinic. If you do not have a mask, one will be given to you upon arrival. For doctor visits, patients may have 1 support person aged 85 or older with them. For treatment visits, patients cannot have anyone with them due to current Covid guidelines and our immunocompromised population.

## 2021-06-28 ENCOUNTER — Inpatient Hospital Stay: Payer: Managed Care, Other (non HMO)

## 2021-06-29 ENCOUNTER — Inpatient Hospital Stay: Payer: Managed Care, Other (non HMO) | Admitting: Internal Medicine

## 2021-07-09 ENCOUNTER — Other Ambulatory Visit: Payer: Self-pay | Admitting: Internal Medicine

## 2021-07-11 NOTE — Progress Notes (Signed)
[place] ?OFFICE PROGRESS NOTE ? ?Joseph Sill, NP ?[name] ?[place] [address] ? ?DIAGNOSIS: Stage IV carcinoma, non-small cell lung cancer, adenocarcinoma.  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 He was diagnosed in July 2021. ?  ?Molecular Biomarkers:  ?MSI-High ?DETECTED ?Pembrolizumab ?Atezolizumab, ?Avelumab, ?Cemiplimab, ?Dostarlimab, ?Durvalumab, ?Ipilimumab, ?Nivolumab ?  ?STK11Splice Site SNV ?3.8% ?Everolimus, ?Temsirolimus ?Yes ?  ?KRASG12D ?1.7% ?Binimetinib ?Yes ?  ?[id number] ?0.4% ?  ?Niraparib, ?Olaparib, ?Rucaparib, ?Talazoparib, ?Tazemetostat ?Yes ? ?PRIOR THERAPY: ? 1) SRS to the solitary brain metastasis under the care of Dr. Lisbeth Renshaw. Last treatment 7/22/212)  ?2) Weekly concurrent chemoradiation with carboplatin for an AUC of 2, paclitaxel 45 mg/m2.  First dose expected on 11/25/2019. Status post 7 cycles, last dose was giving 01/06/2020 with partial response.  ? ?CURRENT THERAPY: ?1)  Immunotherapy with Keytruda 200 mg IV every 3 weeks.  First dose February 10, 2020 for a patient with MSI high.  Status post 23 cycles. ?2) Avastin 15 mg/KG every 3 weeks.  For the vasogenic edema of the brain.S/P 7 cycles. ? ?INTERVAL HISTORY: ?Joseph Atkins 61 y.o. male returns to the clinic today for a follow-up visit accompanied by his wife.  The patient is tolerating single agent immunotherapy with Keytruda well without any concerning adverse side effects except he has some rash/itching; however, this has improved with kenalog cream substantially. He was referred to dermatology but he was not able to get an appointment until August. He does not take any systemic antihistamines and does not like to take medications that make him feel drowsy as he has some baseline fatigue. Today, the patient denies any fever, chills, night sweats, or weight loss.  He reports stable dyspnea on exertion.   Denies significant cough.  He denies any or hemoptysis.  He denies any nausea, vomiting, constipation or diarrhea. He follows closely with neuro oncology for his history of metastatic disease to the brain. He denies recent headaches or visual changes. The patient is here today for evaluation before starting cycle #24.  ?  ? ? ? ? ?MEDICAL HISTORY: ?Past Medical History:  ?Diagnosis Date  ? Cancer [facility])   ? lung cancer  ? Diverticulosis   ? GERD (gastroesophageal reflux disease)   ? Hx of small bowel obstruction   ? Hyperlipidemia   ? Hypertension   ? Hypothyroidism   ? IBS (irritable bowel syndrome)   ? Substance abuse ([place])   ? Alcoholic, Drug addition  ? Thyroid disease   ? ? ?ALLERGIES:  is allergic to penicillins. ? ?MEDICATIONS:  ?Current Outpatient Medications  ?Medication Sig Dispense Refill  ? albuterol (PROVENTIL HFA;VENTOLIN HFA) 108 (90 Base) MCG/ACT inhaler Inhale 2 puffs into the lungs every 6 (six) hours as needed for wheezing or shortness of breath. 1 Inhaler 0  ? amLODipine (NORVASC) 5 MG tablet TAKE 1 TABLET (5 MG TOTAL) BY MOUTH DAILY. 90 tablet 1  ? ANORO ELLIPTA 62.5-25 MCG/INH AEPB 1 puff daily.    ? aspirin EC 81 MG tablet Take 1 tablet (81 mg total) by mouth daily. Swallow whole. 30 tablet 11  ? cyclobenzaprine (FLEXERIL) 10 MG tablet Take 10 mg by mouth 2 (two) times daily as needed for muscle spasms.    ? docusate sodium (COLACE) 100 MG capsule Take 100 mg by mouth daily.    ? DULoxetine (CYMBALTA) 20 MG capsule Take 1 capsule (20 mg  total) by mouth 2 (two) times daily. 60 capsule 5  ? fluticasone furoate-vilanterol (BREO ELLIPTA) 100-25 MCG/INH AEPB Inhale 1 puff into the lungs daily. 30 each 3  ? levothyroxine (SYNTHROID) 100 MCG tablet TAKE 1 TABLET BY MOUTH EVERY DAY 90 tablet 1  ? lidocaine-prilocaine (EMLA) cream Apply 1 application topically as needed. 30 g 0  ? omeprazole (PRILOSEC) 40 MG capsule Take 1 capsule (40 mg total) by mouth daily. 90 capsule 4  ? ondansetron (ZOFRAN) 4 MG  tablet Take 1 tablet (4 mg total) by mouth every 8 (eight) hours as needed. 40 tablet 2  ? oxyCODONE-acetaminophen (PERCOCET/ROXICET) 5-325 MG tablet Take 1 tablet by mouth every 4 (four) hours as needed for severe pain. 30 tablet 0  ? pembrolizumab (KEYTRUDA) 100 MG/4ML SOLN See admin instructions.    ? polyethylene glycol powder (MIRALAX) powder Take 17 g by mouth daily. 255 g 11  ? prochlorperazine (COMPAZINE) 10 MG tablet Take 1 tablet (10 mg total) by mouth every 6 (six) hours as needed. 30 tablet 2  ? triamcinolone cream (KENALOG) 0.1 % Apply topically 2 (two) times daily as needed. 453.6 g 0  ? TRULANCE 3 MG TABS Take 1 tablet by mouth daily.    ? zolpidem (AMBIEN CR) 12.5 MG CR tablet Take 1 tablet (12.5 mg total) by mouth at bedtime as needed for sleep. 30 tablet 5  ? ?No current facility-administered medications for this visit.  ? ? ?SURGICAL HISTORY:  ?Past Surgical History:  ?Procedure Laterality Date  ? arm surgery Right   ? BRONCHIAL BRUSHINGS  10/24/2019  ? Procedure: BRONCHIAL BRUSHINGS;  Surgeon: Collene Gobble, MD;  Location[facility] ENDOSCOPY;  Service: Cardiopulmonary;;  right lower lobe ?  ? BRONCHIAL BRUSHINGS  11/05/2019  ? Procedure: BRONCHIAL BRUSHINGS;  Surgeon: Collene Gobble, MD;  Location: [facility] ENDOSCOPY;  Service: Pulmonary;;  ? BRONCHIAL NEEDLE ASPIRATION BIOPSY  10/24/2019  ? Procedure: BRONCHIAL NEEDLE ASPIRATION BIOPSIES;  Surgeon: Collene Gobble, MD;  Location: [facility] ENDOSCOPY;  Service: Cardiopulmonary;;  ? BRONCHIAL NEEDLE ASPIRATION BIOPSY  11/05/2019  ? Procedure: BRONCHIAL NEEDLE ASPIRATION BIOPSIES;  Surgeon: Collene Gobble, MD;  Location: [facility] ENDOSCOPY;  Service: Pulmonary;;  ? ENDOBRONCHIAL ULTRASOUND N/A 10/24/2019  ? Procedure: ENDOBRONCHIAL ULTRASOUND;  Surgeon: Collene Gobble, MD;  Location: [facility] ENDOSCOPY;  Service: Cardiopulmonary;  Laterality: N/A;  ? FINGER SURGERY Right   ? Middle  ? IR IMAGING GUIDED PORT INSERTION  11/19/2019  ? VIDEO BRONCHOSCOPY N/A 10/24/2019  ? Procedure: VIDEO  BRONCHOSCOPY WITHOUT FLUORO;  Surgeon: Collene Gobble, MD;  Location: [facility] ENDOSCOPY;  Service: Cardiopulmonary;  Laterality: N/A;  ? VIDEO BRONCHOSCOPY WITH ENDOBRONCHIAL NAVIGATION N/A 11/05/2019  ? Procedure: VIDEO BRONCHOSCOPY WITH ENDOBRONCHIAL NAVIGATION;  Surgeon: Collene Gobble, MD;  Location: [facility] ENDOSCOPY;  Service: Pulmonary;  Laterality: N/A;  ? ? ?REVIEW OF SYSTEMS:   ?Review of Systems  ?Constitutional: Positive for fatigue. Negative for appetite change, chills,  fever and unexpected weight change.  ?HENT: Negative for mouth sores, nosebleeds, sore throat and trouble swallowing.   ?Eyes: Negative for eye problems and icterus.  ?Respiratory: Positive for baseline dyspnea on exertion.  Negative for cough, hemoptysis, and wheezing.   ?Cardiovascular: Negative for chest pain and leg swelling.  ?Gastrointestinal: Negative for abdominal pain, constipation, diarrhea, nausea and vomiting.  ?Genitourinary: Negative for bladder incontinence, difficulty urinating, dysuria, frequency and hematuria.   ?Musculoskeletal: Negative for back pain, gait problem, neck pain and neck stiffness.  ?Skin: Positive for dry scattered skin  lesions/plaques (improving) ?Neurological: Negative for dizziness, extremity weakness, gait problem, headaches, light-headedness and seizures.  ?Hematological: Negative for adenopathy. Does not bruise/bleed easily.  ?Psychiatric/Behavioral: Negative for confusion, depression and sleep disturbance. The patient is not nervous/anxious.   ? ? ?PHYSICAL EXAMINATION:  ?Blood pressure 136/88, pulse 100, temperature (!) 97.4 ?F (36.3 ?C), temperature source Tympanic, resp. rate 18, height $RemoveBe'5\' 8"'RLSFkMrTJ$  (1.727 m), weight 182 lb 9.6 oz (82.8 kg), SpO2 100 %. ? ?ECOG PERFORMANCE STATUS: 1 ? ?Physical Exam  ?Constitutional: Oriented to person, place, and time and well-developed, well-nourished, and in no distress.  ?HENT:  ?Head: Normocephalic and atraumatic.  ?Mouth/Throat: Oropharynx is clear and moist. No  oropharyngeal exudate.  ?Eyes: Conjunctivae are normal. Right eye exhibits no discharge. Left eye exhibits no discharge. No scleral icterus.  ?Neck: Normal range of motion. Neck supple.  ?Cardiovascular: Normal r

## 2021-07-13 ENCOUNTER — Inpatient Hospital Stay: Payer: Managed Care, Other (non HMO)

## 2021-07-13 ENCOUNTER — Other Ambulatory Visit: Payer: Self-pay

## 2021-07-13 ENCOUNTER — Inpatient Hospital Stay: Payer: Managed Care, Other (non HMO) | Attending: Internal Medicine

## 2021-07-13 ENCOUNTER — Inpatient Hospital Stay (HOSPITAL_BASED_OUTPATIENT_CLINIC_OR_DEPARTMENT_OTHER): Payer: Managed Care, Other (non HMO) | Admitting: Physician Assistant

## 2021-07-13 VITALS — BP 136/88 | HR 100 | Temp 97.4°F | Resp 18 | Ht 68.0 in | Wt 182.6 lb

## 2021-07-13 DIAGNOSIS — C3491 Malignant neoplasm of unspecified part of right bronchus or lung: Secondary | ICD-10-CM | POA: Diagnosis not present

## 2021-07-13 DIAGNOSIS — Z5112 Encounter for antineoplastic immunotherapy: Secondary | ICD-10-CM | POA: Diagnosis not present

## 2021-07-13 DIAGNOSIS — Z79899 Other long term (current) drug therapy: Secondary | ICD-10-CM | POA: Diagnosis not present

## 2021-07-13 DIAGNOSIS — Z95828 Presence of other vascular implants and grafts: Secondary | ICD-10-CM

## 2021-07-13 DIAGNOSIS — C7931 Secondary malignant neoplasm of brain: Secondary | ICD-10-CM | POA: Insufficient documentation

## 2021-07-13 DIAGNOSIS — C3431 Malignant neoplasm of lower lobe, right bronchus or lung: Secondary | ICD-10-CM | POA: Diagnosis not present

## 2021-07-13 LAB — CMP (CANCER CENTER ONLY)
ALT: 17 U/L (ref 0–44)
AST: 19 U/L (ref 15–41)
Albumin: 4.4 g/dL (ref 3.5–5.0)
Alkaline Phosphatase: 72 U/L (ref 38–126)
Anion gap: 7 (ref 5–15)
BUN: 13 mg/dL (ref 6–20)
CO2: 25 mmol/L (ref 22–32)
Calcium: 9.4 mg/dL (ref 8.9–10.3)
Chloride: 103 mmol/L (ref 98–111)
Creatinine: 0.88 mg/dL (ref 0.61–1.24)
GFR, Estimated: >60 mL/min (ref >60)
Glucose, Bld: 129 mg/dL — ABNORMAL HIGH (ref 70–99)
Potassium: 4.1 mmol/L (ref 3.5–5.1)
Sodium: 135 mmol/L (ref 135–145)
Total Bilirubin: 0.7 mg/dL (ref 0.3–1.2)
Total Protein: 7.3 g/dL (ref 6.5–8.1)

## 2021-07-13 LAB — CBC WITH DIFFERENTIAL (CANCER CENTER ONLY)
Abs Immature Granulocytes: 0.04 10*3/uL (ref 0.00–0.07)
Basophils Absolute: 0.1 10*3/uL (ref 0.0–0.1)
Basophils Relative: 1 %
Eosinophils Absolute: 0.2 10*3/uL (ref 0.0–0.5)
Eosinophils Relative: 2 %
HCT: 40 % (ref 39.0–52.0)
Hemoglobin: 13.3 g/dL (ref 13.0–17.0)
Immature Granulocytes: 0 %
Lymphocytes Relative: 13 %
Lymphs Abs: 1.1 10*3/uL (ref 0.7–4.0)
MCH: 29.2 pg (ref 26.0–34.0)
MCHC: 33.3 g/dL (ref 30.0–36.0)
MCV: 87.9 fL (ref 80.0–100.0)
Monocytes Absolute: 0.8 10*3/uL (ref 0.1–1.0)
Monocytes Relative: 9 %
Neutro Abs: 6.8 10*3/uL (ref 1.7–7.7)
Neutrophils Relative %: 75 %
Platelet Count: 216 10*3/uL (ref 150–400)
RBC: 4.55 MIL/uL (ref 4.22–5.81)
RDW: 13.5 % (ref 11.5–15.5)
WBC Count: 9 10*3/uL (ref 4.0–10.5)
nRBC: 0 % (ref 0.0–0.2)

## 2021-07-13 LAB — TSH: TSH: 2.844 u[IU]/mL (ref 0.320–4.118)

## 2021-07-13 MED ORDER — SODIUM CHLORIDE 0.9 % IV SOLN
200.0000 mg | Freq: Once | INTRAVENOUS | Status: AC
  Administered 2021-07-13: 200 mg via INTRAVENOUS
  Filled 2021-07-13: qty 200

## 2021-07-13 MED ORDER — SODIUM CHLORIDE 0.9 % IV SOLN: Freq: Once | INTRAVENOUS | Status: AC

## 2021-07-13 MED ORDER — HEPARIN SOD (PORK) LOCK FLUSH 100 UNIT/ML IV SOLN
500.0000 [IU] | Freq: Once | INTRAVENOUS | Status: AC | PRN
  Administered 2021-07-13: 500 [IU]

## 2021-07-13 MED ORDER — SODIUM CHLORIDE 0.9% FLUSH
10.0000 mL | INTRAVENOUS | Status: DC | PRN
  Administered 2021-07-13: 10 mL

## 2021-07-13 NOTE — Patient Instructions (Signed)
[facility]  Discharge Instructions: °Thank you for choosing [facility] to provide your oncology and hematology care.  ° °If you have a lab appointment with the Cancer Center, please go directly to the Cancer Center and check in at the registration area. °  °Wear comfortable clothing and clothing appropriate for easy access to any Portacath or PICC line.  ° °We strive to give you quality time with your provider. You may need to reschedule your appointment if you arrive late (15 or more minutes).  Arriving late affects you and other patients whose appointments are after yours.  Also, if you miss three or more appointments without notifying the office, you may be dismissed from the clinic at the provider’s discretion.    °  °For prescription refill requests, have your pharmacy contact our office and allow 72 hours for refills to be completed.   ° °Today you received the following chemotherapy and/or immunotherapy agent: Pembrolizumab (Keytruda) °  °To help prevent nausea and vomiting after your treatment, we encourage you to take your nausea medication as directed. ° °BELOW ARE SYMPTOMS THAT SHOULD BE REPORTED IMMEDIATELY: °*FEVER GREATER THAN 100.4 F (38 °C) OR HIGHER °*CHILLS OR SWEATING °*NAUSEA AND VOMITING THAT IS NOT CONTROLLED WITH YOUR NAUSEA MEDICATION °*UNUSUAL SHORTNESS OF BREATH °*UNUSUAL BRUISING OR BLEEDING °*URINARY PROBLEMS (pain or burning when urinating, or frequent urination) °*BOWEL PROBLEMS (unusual diarrhea, constipation, pain near the anus) °TENDERNESS IN MOUTH AND THROAT WITH OR WITHOUT PRESENCE OF ULCERS (sore throat, sores in mouth, or a toothache) °UNUSUAL RASH, SWELLING OR PAIN  °UNUSUAL VAGINAL DISCHARGE OR ITCHING  ° °Items with * indicate a potential emergency and should be followed up as soon as possible or go to the Emergency Department if any problems should occur. ° °Please show the CHEMOTHERAPY ALERT CARD or IMMUNOTHERAPY ALERT CARD at  check-in to the Emergency Department and triage nurse. ° °Should you have questions after your visit or need to cancel or reschedule your appointment, please contact [facility]  Dept: [phone]  and follow the prompts.  Office hours are 8:00 a.m. to 4:30 p.m. Monday - Friday. Please note that voicemails left after 4:00 p.m. may not be returned until the following business day.  We are closed weekends and major holidays. You have access to a nurse at all times for urgent questions. Please call the main number to the clinic Dept: [phone] and follow the prompts. ° ° °For any non-urgent questions, you may also contact your provider using MyChart. We now offer e-Visits for anyone 18 and older to request care online for non-urgent symptoms. For details visit [email or web address]. °  °Also download the MyChart app! Go to the app store, search "MyChart", open the app, select , and log in with your MyChart username and password. ° °Due to Covid, a mask is required upon entering the hospital/clinic. If you do not have a mask, one will be given to you upon arrival. For doctor visits, patients may have 1 support person aged 18 or older with them. For treatment visits, patients cannot have anyone with them due to current Covid guidelines and our immunocompromised population.  ° °

## 2021-07-20 ENCOUNTER — Other Ambulatory Visit: Payer: Self-pay | Admitting: Radiation Therapy

## 2021-07-20 DIAGNOSIS — C7931 Secondary malignant neoplasm of brain: Secondary | ICD-10-CM

## 2021-07-20 NOTE — Progress Notes (Signed)
Orders entered to access port the day of brain MRI at [facility].   Reyanne Hussar R.T.(R)(T) Radiation Special Procedures Navigator  

## 2021-08-03 ENCOUNTER — Other Ambulatory Visit: Payer: Self-pay

## 2021-08-03 ENCOUNTER — Inpatient Hospital Stay: Payer: Managed Care, Other (non HMO)

## 2021-08-03 ENCOUNTER — Inpatient Hospital Stay (HOSPITAL_BASED_OUTPATIENT_CLINIC_OR_DEPARTMENT_OTHER): Payer: Managed Care, Other (non HMO) | Admitting: Internal Medicine

## 2021-08-03 ENCOUNTER — Inpatient Hospital Stay: Payer: Managed Care, Other (non HMO) | Attending: Internal Medicine

## 2021-08-03 VITALS — BP 124/81 | HR 100 | Temp 97.5°F | Resp 18 | Ht 68.0 in | Wt 178.5 lb

## 2021-08-03 DIAGNOSIS — Z7982 Long term (current) use of aspirin: Secondary | ICD-10-CM | POA: Insufficient documentation

## 2021-08-03 DIAGNOSIS — G936 Cerebral edema: Secondary | ICD-10-CM | POA: Diagnosis not present

## 2021-08-03 DIAGNOSIS — C3491 Malignant neoplasm of unspecified part of right bronchus or lung: Secondary | ICD-10-CM

## 2021-08-03 DIAGNOSIS — C3431 Malignant neoplasm of lower lobe, right bronchus or lung: Secondary | ICD-10-CM | POA: Insufficient documentation

## 2021-08-03 DIAGNOSIS — E785 Hyperlipidemia, unspecified: Secondary | ICD-10-CM | POA: Insufficient documentation

## 2021-08-03 DIAGNOSIS — Z95828 Presence of other vascular implants and grafts: Secondary | ICD-10-CM

## 2021-08-03 DIAGNOSIS — E039 Hypothyroidism, unspecified: Secondary | ICD-10-CM | POA: Insufficient documentation

## 2021-08-03 DIAGNOSIS — L299 Pruritus, unspecified: Secondary | ICD-10-CM

## 2021-08-03 DIAGNOSIS — Z5112 Encounter for antineoplastic immunotherapy: Secondary | ICD-10-CM

## 2021-08-03 DIAGNOSIS — I10 Essential (primary) hypertension: Secondary | ICD-10-CM | POA: Diagnosis not present

## 2021-08-03 DIAGNOSIS — Z79899 Other long term (current) drug therapy: Secondary | ICD-10-CM | POA: Diagnosis not present

## 2021-08-03 DIAGNOSIS — K589 Irritable bowel syndrome without diarrhea: Secondary | ICD-10-CM | POA: Diagnosis not present

## 2021-08-03 DIAGNOSIS — Z923 Personal history of irradiation: Secondary | ICD-10-CM | POA: Diagnosis not present

## 2021-08-03 DIAGNOSIS — C7931 Secondary malignant neoplasm of brain: Secondary | ICD-10-CM | POA: Diagnosis not present

## 2021-08-03 DIAGNOSIS — R1310 Dysphagia, unspecified: Secondary | ICD-10-CM | POA: Diagnosis not present

## 2021-08-03 DIAGNOSIS — F1010 Alcohol abuse, uncomplicated: Secondary | ICD-10-CM | POA: Diagnosis not present

## 2021-08-03 DIAGNOSIS — K219 Gastro-esophageal reflux disease without esophagitis: Secondary | ICD-10-CM | POA: Insufficient documentation

## 2021-08-03 DIAGNOSIS — C3490 Malignant neoplasm of unspecified part of unspecified bronchus or lung: Secondary | ICD-10-CM

## 2021-08-03 LAB — CBC WITH DIFFERENTIAL (CANCER CENTER ONLY)
Abs Immature Granulocytes: 0.04 10*3/uL (ref 0.00–0.07)
Basophils Absolute: 0.1 10*3/uL (ref 0.0–0.1)
Basophils Relative: 1 %
Eosinophils Absolute: 0.3 10*3/uL (ref 0.0–0.5)
Eosinophils Relative: 3 %
HCT: 40.2 % (ref 39.0–52.0)
Hemoglobin: 13.8 g/dL (ref 13.0–17.0)
Immature Granulocytes: 1 %
Lymphocytes Relative: 15 %
Lymphs Abs: 1.4 10*3/uL (ref 0.7–4.0)
MCH: 29.9 pg (ref 26.0–34.0)
MCHC: 34.3 g/dL (ref 30.0–36.0)
MCV: 87 fL (ref 80.0–100.0)
Monocytes Absolute: 0.9 10*3/uL (ref 0.1–1.0)
Monocytes Relative: 10 %
Neutro Abs: 6.3 10*3/uL (ref 1.7–7.7)
Neutrophils Relative %: 70 %
Platelet Count: 225 10*3/uL (ref 150–400)
RBC: 4.62 MIL/uL (ref 4.22–5.81)
RDW: 13.4 % (ref 11.5–15.5)
WBC Count: 8.9 10*3/uL (ref 4.0–10.5)
nRBC: 0 % (ref 0.0–0.2)

## 2021-08-03 LAB — CMP (CANCER CENTER ONLY)
ALT: 18 U/L (ref 0–44)
AST: 19 U/L (ref 15–41)
Albumin: 4.4 g/dL (ref 3.5–5.0)
Alkaline Phosphatase: 80 U/L (ref 38–126)
Anion gap: 6 (ref 5–15)
BUN: 17 mg/dL (ref 6–20)
CO2: 24 mmol/L (ref 22–32)
Calcium: 8.9 mg/dL (ref 8.9–10.3)
Chloride: 102 mmol/L (ref 98–111)
Creatinine: 0.95 mg/dL (ref 0.61–1.24)
GFR, Estimated: >60 mL/min (ref >60)
Glucose, Bld: 97 mg/dL (ref 70–99)
Potassium: 4.5 mmol/L (ref 3.5–5.1)
Sodium: 132 mmol/L — ABNORMAL LOW (ref 135–145)
Total Bilirubin: 0.6 mg/dL (ref 0.3–1.2)
Total Protein: 7.3 g/dL (ref 6.5–8.1)

## 2021-08-03 LAB — TSH: TSH: 4.647 u[IU]/mL — ABNORMAL HIGH (ref 0.320–4.118)

## 2021-08-03 MED ORDER — HEPARIN SOD (PORK) LOCK FLUSH 100 UNIT/ML IV SOLN
500.0000 [IU] | Freq: Once | INTRAVENOUS | Status: AC | PRN
  Administered 2021-08-03: 500 [IU]

## 2021-08-03 MED ORDER — SODIUM CHLORIDE 0.9 % IV SOLN
200.0000 mg | Freq: Once | INTRAVENOUS | Status: AC
  Administered 2021-08-03: 200 mg via INTRAVENOUS
  Filled 2021-08-03: qty 200

## 2021-08-03 MED ORDER — TRIAMCINOLONE ACETONIDE 0.1 % EX CREA: TOPICAL_CREAM | Freq: Two times a day (BID) | CUTANEOUS | 0 refills | Status: DC | PRN

## 2021-08-03 MED ORDER — SODIUM CHLORIDE 0.9 % IV SOLN: Freq: Once | INTRAVENOUS | Status: AC

## 2021-08-03 MED ORDER — SODIUM CHLORIDE 0.9% FLUSH
10.0000 mL | INTRAVENOUS | Status: DC | PRN
  Administered 2021-08-03: 10 mL

## 2021-08-03 NOTE — Progress Notes (Signed)
?    [place] ?[phone]   [phone] ? ?OFFICE PROGRESS NOTE ? ?Joseph Sill, NP ?[name] ?[place] [address] ? ?DIAGNOSIS: Stage IV (T3, N0, M1C) non-small cell lung cancer, adenocarcinoma.  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 He was diagnosed in July 2021. ?  ?Molecular Biomarkers:  ?MSI-High ?DETECTED ?Pembrolizumab ?Atezolizumab, ?Avelumab, ?Cemiplimab, ?Dostarlimab, ?Durvalumab, ?Ipilimumab, ?Nivolumab ?  ?STK11Splice Site SNV ?8.1% ?Everolimus, ?Temsirolimus ?Yes ?  ?KRASG12D ?1.7% ?Binimetinib ?Yes ?  ?[id number] ?0.4% ?  ?Niraparib, ?Olaparib, ?Rucaparib, ?Talazoparib, ?Tazemetostat ?Yes ?  ?PRIOR THERAPY:  ?1) SRS to the solitary brain metastasis under the care of Dr. Lisbeth [facility]kins. Last treatment 11/14/19. ?2) Weekly concurrent chemoradiation with carboplatin for an AUC of 2, paclitaxel 45 mg/m2.  First dose expected on 11/25/2019. Status post 7 cycles, last dose was giving 01/06/2020 with partial response.  ?  ?CURRENT THERAPY:  ?1)  Immunotherapy with Keytruda 200 mg IV every 3 weeks.  First dose February 10, 2020 for a patient with MSI high.  Status post 24  cycles. ?2) Avastin 15 mg/KG every 3 weeks.  First dose today for the vasogenic edema of the brain.S/P 7 cycles. ? ?INTERVAL HISTORY: ?Joseph Atkins 61 y.o. male returns to the clinic today for follow-up visit accompanied by his wife.  The patient is feeling fine today with no concerning complaints except for the skin lesions in the arms and back.  It has significantly improved after applying Kenalog to these areas.  The patient denied having any current chest pain, shortness of breath, cough or hemoptysis.  He denied having any fever or chills.  He has no nausea, vomiting, diarrhea or constipation.  He has no headache or visual changes.  He continues to tolerate his treatment with Keytruda fairly well.  He is  here for evaluation before starting cycle #25. ? ?MEDICAL HISTORY: ?Past Medical History:  ?Diagnosis Date  ? Cancer [facility])   ? lung cancer  ? Diverticulosis   ? GERD (gastroesophageal reflux disease)   ? Hx of small bowel obstruction   ? Hyperlipidemia   ? Hypertension   ? Hypothyroidism   ? IBS (irritable bowel syndrome)   ? Substance abuse ([place])   ? Alcoholic, Drug addition  ? Thyroid disease   ? ? ?ALLERGIES:  is allergic to penicillins. ? ?MEDICATIONS:  ?Current Outpatient Medications  ?Medication Sig Dispense Refill  ? albuterol (PROVENTIL HFA;VENTOLIN HFA) 108 (90 Base) MCG/ACT inhaler Inhale 2 puffs into the lungs every 6 (six) hours as needed for wheezing or shortness of breath. 1 Inhaler 0  ? amLODipine (NORVASC) 5 MG tablet TAKE 1 TABLET (5 MG TOTAL) BY MOUTH DAILY. 90 tablet 1  ? ANORO ELLIPTA 62.5-25 MCG/INH AEPB 1 puff daily.    ? aspirin EC 81 MG tablet Take 1 tablet (81 mg total) by mouth daily. Swallow whole. 30 tablet 11  ? cyclobenzaprine (FLEXERIL) 10 MG tablet Take 10 mg by mouth 2 (two) times daily as needed for muscle spasms.    ? docusate sodium (COLACE) 100 MG capsule Take 100 mg by mouth daily.    ? DULoxetine (CYMBALTA) 20 MG capsule Take 1 capsule (20 mg total) by mouth 2 (two) times daily. 60 capsule 5  ? fluticasone furoate-vilanterol (BREO ELLIPTA) 100-25 MCG/INH AEPB Inhale 1 puff into the lungs daily. 30 each 3  ? levothyroxine (SYNTHROID) 100 MCG tablet TAKE  1 TABLET BY MOUTH EVERY DAY 90 tablet 1  ? lidocaine-prilocaine (EMLA) cream Apply 1 application topically as needed. 30 g 0  ? omeprazole (PRILOSEC) 40 MG capsule Take 1 capsule (40 mg total) by mouth daily. 90 capsule 4  ? ondansetron (ZOFRAN) 4 MG tablet Take 1 tablet (4 mg total) by mouth every 8 (eight) hours as needed. 40 tablet 2  ? oxyCODONE-acetaminophen (PERCOCET/ROXICET) 5-325 MG tablet Take 1 tablet by mouth every 4 (four) hours as needed for severe pain. 30 tablet 0  ? pembrolizumab (KEYTRUDA) 100 MG/4ML SOLN See  admin instructions.    ? polyethylene glycol powder (MIRALAX) powder Take 17 g by mouth daily. 255 g 11  ? prochlorperazine (COMPAZINE) 10 MG tablet Take 1 tablet (10 mg total) by mouth every 6 (six) hours as needed. 30 tablet 2  ? triamcinolone cream (KENALOG) 0.1 % Apply topically 2 (two) times daily as needed. 453.6 g 0  ? TRULANCE 3 MG TABS Take 1 tablet by mouth daily.    ? zolpidem (AMBIEN CR) 12.5 MG CR tablet Take 1 tablet (12.5 mg total) by mouth at bedtime as needed for sleep. 30 tablet 5  ? ?No current facility-administered medications for this visit.  ? ? ?SURGICAL HISTORY:  ?Past Surgical History:  ?Procedure Laterality Date  ? arm surgery Right   ? BRONCHIAL BRUSHINGS  10/24/2019  ? Procedure: BRONCHIAL BRUSHINGS;  Surgeon: Joseph Gobble, MD;  Location: [facility] ENDOSCOPY;  Service: Cardiopulmonary;;  right lower lobe ?  ? BRONCHIAL BRUSHINGS  11/05/2019  ? Procedure: BRONCHIAL BRUSHINGS;  Surgeon: Joseph Gobble, MD;  Location: [facility] ENDOSCOPY;  Service: Pulmonary;;  ? BRONCHIAL NEEDLE ASPIRATION BIOPSY  10/24/2019  ? Procedure: BRONCHIAL NEEDLE ASPIRATION BIOPSIES;  Surgeon: Joseph Gobble, MD;  Location: [facility] ENDOSCOPY;  Service: Cardiopulmonary;;  ? BRONCHIAL NEEDLE ASPIRATION BIOPSY  11/05/2019  ? Procedure: BRONCHIAL NEEDLE ASPIRATION BIOPSIES;  Surgeon: Joseph Gobble, MD;  Location[facility] ENDOSCOPY;  Service: Pulmonary;;  ? ENDOBRONCHIAL ULTRASOUND N/A 10/24/2019  ? Procedure: ENDOBRONCHIAL ULTRASOUND;  Surgeon: Joseph Gobble, MD;  Location: [facility] ENDOSCOPY;  Service: Cardiopulmonary;  Laterality: N/A;  ? FINGER SURGERY Right   ? Middle  ? IR IMAGING GUIDED PORT INSERTION  11/19/2019  ? VIDEO BRONCHOSCOPY N/A 10/24/2019  ? Procedure: VIDEO BRONCHOSCOPY WITHOUT FLUORO;  Surgeon: Joseph Gobble, MD;  Location: [facility] ENDOSCOPY;  Service: Cardiopulmonary;  Laterality: N/A;  ? VIDEO BRONCHOSCOPY WITH ENDOBRONCHIAL NAVIGATION N/A 11/05/2019  ? Procedure: VIDEO BRONCHOSCOPY WITH ENDOBRONCHIAL NAVIGATION;  Surgeon: Joseph Gobble, MD;  Location: [facility] ENDOSCOPY;  Service: Pulmonary;  Laterality: N/A;  ? ? ?REVIEW OF SYSTEMS:  A comprehensive review of systems was negative except for: Constitutional: positive for fatigue ?Integument/breast: positive for dryness, pruritus, rash, and skin lesion(s)  ? ?PHYSICAL EXAMINATION: General appearance: alert, cooperative, fatigued, and no distress ?Head: Normocephalic, without obvious abnormality, atraumatic ?Neck: no adenopathy, no JVD, supple, symmetrical, trachea midline, and thyroid not enlarged, symmetric, no tenderness/mass/nodules ?Lymph nodes: Cervical, supraclavicular, and axillary nodes normal. ?Resp: clear to auscultation bilaterally ?Back: symmetric, no curvature. ROM normal. No CVA tenderness. ?Cardio: regular rate and rhythm, S1, S2 normal, no murmur, click, rub or gallop ?GI: soft, non-tender; bowel sounds normal; no masses,  no organomegaly ?Extremities: extremities normal, atraumatic, no cyanosis or edema ? ?ECOG PERFORMANCE STATUS: 1 - Symptomatic but completely ambulatory ? ?Blood pressure 124/81, pulse 100, temperature (!) 97.5 ?F (36.4 ?C), temperature source Tympanic, resp. rate 18, height $RemoveBe'5\' 8"'RCcpebOcq$  (1.727 m), weight 178 lb  8 oz (81 kg), SpO2 100 %. ? ?LABORATORY DATA: ?Lab Results  ?Component Value Date  ? WBC 8.9 08/03/2021  ? HGB 13.8 08/03/2021  ? HCT 40.2 08/03/2021  ? MCV 87.0 08/03/2021  ? PLT 225 08/03/2021  ? ? ?  Chemistry   ?   ?Component Value Date/Time  ? NA 132 (L) 08/03/2021 1107  ? NA 141 03/08/2017 1707  ? K 4.5 08/03/2021 1107  ? CL 102 08/03/2021 1107  ? CO2 24 08/03/2021 1107  ? BUN 17 08/03/2021 1107  ? BUN 12 03/08/2017 1707  ? CREATININE 0.95 08/03/2021 1107  ?    ?Component Value Date/Time  ? CALCIUM 8.9 08/03/2021 1107  ? ALKPHOS 80 08/03/2021 1107  ? AST 19 08/03/2021 1107  ? ALT 18 08/03/2021 1107  ? BILITOT 0.6 08/03/2021 1107  ?  ? ? ? ?RADIOGRAPHIC STUDIES: ?No results found. ? ? ?ASSESSMENT AND PLAN: This is a very pleasant 61 years old white male  with stage IV (T3, N0, M1c) non-small cell lung cancer, adenocarcinoma with MSI high presented with right lower lobe/infrahilar mass in addition to solitary brain metastasis in the left cerebellum diagnosed i

## 2021-08-03 NOTE — Patient Instructions (Signed)
[facility]  Discharge Instructions: ?Thank you for choosing [facility] to provide your oncology and hematology care.  ? ?If you have a lab appointment with the Cancer Center, please go directly to the Cancer Center and check in at the registration area. ?  ?Wear comfortable clothing and clothing appropriate for easy access to any Portacath or PICC line.  ? ?We strive to give you quality time with your provider. You may need to reschedule your appointment if you arrive late (15 or more minutes).  Arriving late affects you and other patients whose appointments are after yours.  Also, if you miss three or more appointments without notifying the office, you may be dismissed from the clinic at the provider?s discretion.    ?  ?For prescription refill requests, have your pharmacy contact our office and allow 72 hours for refills to be completed.   ? ?Today you received the following chemotherapy and/or immunotherapy agents: Keytruda ?  ?To help prevent nausea and vomiting after your treatment, we encourage you to take your nausea medication as directed. ? ?BELOW ARE SYMPTOMS THAT SHOULD BE REPORTED IMMEDIATELY: ?*FEVER GREATER THAN 100.4 F (38 ?C) OR HIGHER ?*CHILLS OR SWEATING ?*NAUSEA AND VOMITING THAT IS NOT CONTROLLED WITH YOUR NAUSEA MEDICATION ?*UNUSUAL SHORTNESS OF BREATH ?*UNUSUAL BRUISING OR BLEEDING ?*URINARY PROBLEMS (pain or burning when urinating, or frequent urination) ?*BOWEL PROBLEMS (unusual diarrhea, constipation, pain near the anus) ?TENDERNESS IN MOUTH AND THROAT WITH OR WITHOUT PRESENCE OF ULCERS (sore throat, sores in mouth, or a toothache) ?UNUSUAL RASH, SWELLING OR PAIN  ?UNUSUAL VAGINAL DISCHARGE OR ITCHING  ? ?Items with * indicate a potential emergency and should be followed up as soon as possible or go to the Emergency Department if any problems should occur. ? ?Please show the CHEMOTHERAPY ALERT CARD or IMMUNOTHERAPY ALERT CARD at check-in to the  Emergency Department and triage nurse. ? ?Should you have questions after your visit or need to cancel or reschedule your appointment, please contact [facility]  Dept: [phone]  and follow the prompts.  Office hours are 8:00 a.m. to 4:30 p.m. Monday - Friday. Please note that voicemails left after 4:00 p.m. may not be returned until the following business day.  We are closed weekends and major holidays. You have access to a nurse at all times for urgent questions. Please call the main number to the clinic Dept: [phone] and follow the prompts. ? ? ?For any non-urgent questions, you may also contact your provider using MyChart. We now offer e-Visits for anyone 18 and older to request care online for non-urgent symptoms. For details visit [email or web address]. ?  ?Also download the MyChart app! Go to the app store, search "MyChart", open the app, select [facility], and log in with your MyChart username and password. ? ?Due to Covid, a mask is required upon entering the hospital/clinic. If you do not have a mask, one will be given to you upon arrival. For doctor visits, patients may have 1 support person aged 18 or older with them. For treatment visits, patients cannot have anyone with them due to current Covid guidelines and our immunocompromised population.  ? ?

## 2021-08-05 ENCOUNTER — Other Ambulatory Visit: Payer: Self-pay | Admitting: Internal Medicine

## 2021-08-05 ENCOUNTER — Other Ambulatory Visit: Payer: Self-pay | Admitting: Physician Assistant

## 2021-08-05 DIAGNOSIS — C3490 Malignant neoplasm of unspecified part of unspecified bronchus or lung: Secondary | ICD-10-CM

## 2021-08-05 DIAGNOSIS — C3491 Malignant neoplasm of unspecified part of right bronchus or lung: Secondary | ICD-10-CM

## 2021-08-20 NOTE — Progress Notes (Signed)
[place] ?OFFICE PROGRESS NOTE ? ?Joseph Sill, NP ?[name] ?[place] [address] ? ?DIAGNOSIS:  Stage IV carcinoma, non-small cell lung cancer, adenocarcinoma.  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 He was diagnosed in July 2021. ?  ?Molecular Biomarkers:  ?MSI-High ?DETECTED ?Pembrolizumab ?Atezolizumab, ?Avelumab, ?Cemiplimab, ?Dostarlimab, ?Durvalumab, ?Ipilimumab, ?Nivolumab ?  ?STK11Splice Site SNV ?6.4% ?Everolimus, ?Temsirolimus ?Yes ?  ?KRASG12D ?1.7% ?Binimetinib ?Yes ?  ?[id number] ?0.4% ?  ?Niraparib, ?Olaparib, ?Rucaparib, ?Talazoparib, ?Tazemetostat ?Yes ? ?PRIOR THERAPY: 1) SRS to the solitary brain metastasis under the care of Dr. Lisbeth Renshaw. Last treatment 7/22/212)  ?2) Weekly concurrent chemoradiation with carboplatin for an AUC of 2, paclitaxel 45 mg/m2.  First dose expected on 11/25/2019. Status post 7 cycles, last dose was giving 01/06/2020 with partial response.  ?  ? ?CURRENT THERAPY: ? 1)  Immunotherapy with Keytruda 200 mg IV every 3 weeks.  First dose February 10, 2020 for a patient with MSI high.  Status post 25 cycles. ?2) Avastin 15 mg/KG every 3 weeks.  For the vasogenic edema of the brain.S/P 7 cycles. ? ?INTERVAL HISTORY: ?Joseph Atkins 61 y.o. male returns to the clinic today for a follow-up visit accompanied by his wife.  The patient is tolerating single agent immunotherapy with Keytruda well without any concerning adverse side effects except he has some rash/itching; however, this has improved with kenalog cream substantially. He was referred to dermatology but he was not able to get an appointment until August. He does not take any systemic antihistamines and does not like to take medications that make him feel drowsy as he has some baseline fatigue. Today, the patient denies any fever, chills, night sweats, or weight loss.  He reports stable dyspnea on exertion.   Denies significant cough.  He denies any or hemoptysis.  He denies any nausea, vomiting, constipation or diarrhea. He follows closely with neuro oncology for his history of metastatic disease to the brain.  He has a follow-up brain MRI next month on 09/09/2021.  He was supposed have a restaging CT scan of the chest, abdomen, and pelvis but this has not been scheduled until 09/09/2021. He denies recent headaches or visual changes. The patient is here today for evaluation before starting cycle #26.  ? ?MEDICAL HISTORY: ?Past Medical History:  ?Diagnosis Date  ? Cancer [facility])   ? lung cancer  ? Diverticulosis   ? GERD (gastroesophageal reflux disease)   ? Hx of small bowel obstruction   ? Hyperlipidemia   ? Hypertension   ? Hypothyroidism   ? IBS (irritable bowel syndrome)   ? Substance abuse ([place])   ? Alcoholic, Drug addition  ? Thyroid disease   ? ? ?ALLERGIES:  is allergic to penicillins. ? ?MEDICATIONS:  ?Current Outpatient Medications  ?Medication Sig Dispense Refill  ? albuterol (PROVENTIL HFA;VENTOLIN HFA) 108 (90 Base) MCG/ACT inhaler Inhale 2 puffs into the lungs every 6 (six) hours as needed for wheezing or shortness of breath. 1 Inhaler 0  ? amLODipine (NORVASC) 5 MG tablet TAKE 1 TABLET (5 MG TOTAL) BY MOUTH DAILY. 90 tablet 1  ? ANORO ELLIPTA 62.5-25 MCG/INH AEPB 1 puff daily.    ? aspirin EC 81 MG tablet Take 1 tablet (81 mg total) by mouth daily. Swallow whole. 30 tablet 11  ? cyclobenzaprine (FLEXERIL) 10 MG tablet Take 10 mg by mouth 2 (two) times daily as needed for  muscle spasms.    ? docusate sodium (COLACE) 100 MG capsule Take 100 mg by mouth daily.    ? DULoxetine (CYMBALTA) 20 MG capsule Take 1 capsule (20 mg total) by mouth 2 (two) times daily. 60 capsule 5  ? fluticasone furoate-vilanterol (BREO ELLIPTA) 100-25 MCG/INH AEPB Inhale 1 puff into the lungs daily. 30 each 3  ? levothyroxine (SYNTHROID) 100 MCG tablet TAKE 1 TABLET BY MOUTH EVERY DAY 90 tablet 1  ? lidocaine-prilocaine (EMLA) cream  Apply 1 application topically as needed. 30 g 0  ? omeprazole (PRILOSEC) 40 MG capsule Take 1 capsule (40 mg total) by mouth daily. 90 capsule 4  ? ondansetron (ZOFRAN) 4 MG tablet Take 1 tablet (4 mg total) by mouth every 8 (eight) hours as needed. 40 tablet 2  ? oxyCODONE-acetaminophen (PERCOCET/ROXICET) 5-325 MG tablet Take 1 tablet by mouth every 4 (four) hours as needed for severe pain. 30 tablet 0  ? pembrolizumab (KEYTRUDA) 100 MG/4ML SOLN See admin instructions.    ? polyethylene glycol powder (MIRALAX) powder Take 17 g by mouth daily. 255 g 11  ? prochlorperazine (COMPAZINE) 10 MG tablet Take 1 tablet (10 mg total) by mouth every 6 (six) hours as needed. 30 tablet 2  ? triamcinolone cream (KENALOG) 0.1 % Apply topically 2 (two) times daily as needed. 453.6 g 0  ? TRULANCE 3 MG TABS Take 1 tablet by mouth daily.    ? zolpidem (AMBIEN CR) 12.5 MG CR tablet Take 1 tablet (12.5 mg total) by mouth at bedtime as needed for sleep. 30 tablet 5  ? ?No current facility-administered medications for this visit.  ? ? ?SURGICAL HISTORY:  ?Past Surgical History:  ?Procedure Laterality Date  ? arm surgery Right   ? BRONCHIAL BRUSHINGS  10/24/2019  ? Procedure: BRONCHIAL BRUSHINGS;  Surgeon: Collene Gobble, MD;  Location: [facility] ENDOSCOPY;  Service: Cardiopulmonary;;  right lower lobe ?  ? BRONCHIAL BRUSHINGS  11/05/2019  ? Procedure: BRONCHIAL BRUSHINGS;  Surgeon: Collene Gobble, MD;  Location: [facility] ENDOSCOPY;  Service: Pulmonary;;  ? BRONCHIAL NEEDLE ASPIRATION BIOPSY  10/24/2019  ? Procedure: BRONCHIAL NEEDLE ASPIRATION BIOPSIES;  Surgeon: Collene Gobble, MD;  Location[facility] ENDOSCOPY;  Service: Cardiopulmonary;;  ? BRONCHIAL NEEDLE ASPIRATION BIOPSY  11/05/2019  ? Procedure: BRONCHIAL NEEDLE ASPIRATION BIOPSIES;  Surgeon: Collene Gobble, MD;  Location: [facility] ENDOSCOPY;  Service: Pulmonary;;  ? ENDOBRONCHIAL ULTRASOUND N/A 10/24/2019  ? Procedure: ENDOBRONCHIAL ULTRASOUND;  Surgeon: Collene Gobble, MD;  Location: [facility] ENDOSCOPY;   Service: Cardiopulmonary;  Laterality: N/A;  ? FINGER SURGERY Right   ? Middle  ? IR IMAGING GUIDED PORT INSERTION  11/19/2019  ? VIDEO BRONCHOSCOPY N/A 10/24/2019  ? Procedure: VIDEO BRONCHOSCOPY WITHOUT FLUORO;  Surgeon: Collene Gobble, MD;  Location: [facility] ENDOSCOPY;  Service: Cardiopulmonary;  Laterality: N/A;  ? VIDEO BRONCHOSCOPY WITH ENDOBRONCHIAL NAVIGATION N/A 11/05/2019  ? Procedure: VIDEO BRONCHOSCOPY WITH ENDOBRONCHIAL NAVIGATION;  Surgeon: Collene Gobble, MD;  Location: [facility] ENDOSCOPY;  Service: Pulmonary;  Laterality: N/A;  ? ? ?REVIEW OF SYSTEMS:   ?Review of Systems  ?Constitutional: Positive for fatigue. Negative for appetite change, chills,  fever and unexpected weight change.  ?HENT: Negative for mouth sores, nosebleeds, sore throat and trouble swallowing.   ?Eyes: Negative for eye problems and icterus.  ?Respiratory: Positive for baseline dyspnea on exertion.  Negative for cough, hemoptysis, and wheezing.   ?Cardiovascular: Negative for chest pain and leg swelling.  ?Gastrointestinal: Negative for abdominal pain, constipation, diarrhea, nausea and vomiting.  ?  Genitourinary: Negative for bladder incontinence, difficulty urinating, dysuria, frequency and hematuria.   ?Musculoskeletal: Negative for back pain, gait problem, neck pain and neck stiffness.  ?Skin: Positive for dry scattered skin lesions/plaques (improving) ?Neurological: Negative for dizziness, extremity weakness, gait problem, headaches, light-headedness and seizures.  ?Hematological: Negative for adenopathy. Does not bruise/bleed easily.  ?Psychiatric/Behavioral: Negative for confusion, depression and sleep disturbance. The patient is not nervous/anxious.  ? ? ?PHYSICAL EXAMINATION:  ?Blood pressure 107/74, pulse (!) 102, temperature 97.9 ?F (36.6 ?C), temperature source Temporal, resp. rate 18, height _0  (1.727 m), weight 182 lb 14.4 oz (83 kg), SpO2 99 %. ? ?ECOG PERFORMANCE STATUS: 1 ? ?Physical Exam  ?Constitutional: Oriented to  person, place, and time and well-developed, well-nourished, and in no distress.  ?HENT:  ?Head: Normocephalic and atraumatic.  ?Mouth/Throat: Oropharynx is clear and moist. No oropharyngeal exudate.  ?Eyes: Conjunctiv

## 2021-08-24 ENCOUNTER — Inpatient Hospital Stay: Payer: Managed Care, Other (non HMO)

## 2021-08-24 ENCOUNTER — Inpatient Hospital Stay: Payer: Managed Care, Other (non HMO) | Attending: Internal Medicine | Admitting: Physician Assistant

## 2021-08-24 ENCOUNTER — Other Ambulatory Visit: Payer: Self-pay

## 2021-08-24 VITALS — HR 89

## 2021-08-24 VITALS — BP 107/74 | HR 102 | Temp 97.9°F | Resp 18 | Ht 68.0 in | Wt 182.9 lb

## 2021-08-24 DIAGNOSIS — I3139 Other pericardial effusion (noninflammatory): Secondary | ICD-10-CM | POA: Insufficient documentation

## 2021-08-24 DIAGNOSIS — G936 Cerebral edema: Secondary | ICD-10-CM | POA: Diagnosis not present

## 2021-08-24 DIAGNOSIS — Z7982 Long term (current) use of aspirin: Secondary | ICD-10-CM | POA: Insufficient documentation

## 2021-08-24 DIAGNOSIS — Z7951 Long term (current) use of inhaled steroids: Secondary | ICD-10-CM | POA: Insufficient documentation

## 2021-08-24 DIAGNOSIS — Z5112 Encounter for antineoplastic immunotherapy: Secondary | ICD-10-CM

## 2021-08-24 DIAGNOSIS — R1310 Dysphagia, unspecified: Secondary | ICD-10-CM | POA: Insufficient documentation

## 2021-08-24 DIAGNOSIS — N62 Hypertrophy of breast: Secondary | ICD-10-CM | POA: Diagnosis not present

## 2021-08-24 DIAGNOSIS — K589 Irritable bowel syndrome without diarrhea: Secondary | ICD-10-CM | POA: Diagnosis not present

## 2021-08-24 DIAGNOSIS — E785 Hyperlipidemia, unspecified: Secondary | ICD-10-CM | POA: Insufficient documentation

## 2021-08-24 DIAGNOSIS — R21 Rash and other nonspecific skin eruption: Secondary | ICD-10-CM | POA: Diagnosis not present

## 2021-08-24 DIAGNOSIS — Z79899 Other long term (current) drug therapy: Secondary | ICD-10-CM | POA: Insufficient documentation

## 2021-08-24 DIAGNOSIS — I10 Essential (primary) hypertension: Secondary | ICD-10-CM | POA: Insufficient documentation

## 2021-08-24 DIAGNOSIS — E039 Hypothyroidism, unspecified: Secondary | ICD-10-CM | POA: Diagnosis not present

## 2021-08-24 DIAGNOSIS — C3431 Malignant neoplasm of lower lobe, right bronchus or lung: Secondary | ICD-10-CM | POA: Insufficient documentation

## 2021-08-24 DIAGNOSIS — Z95828 Presence of other vascular implants and grafts: Secondary | ICD-10-CM

## 2021-08-24 DIAGNOSIS — C3491 Malignant neoplasm of unspecified part of right bronchus or lung: Secondary | ICD-10-CM

## 2021-08-24 DIAGNOSIS — J432 Centrilobular emphysema: Secondary | ICD-10-CM | POA: Diagnosis not present

## 2021-08-24 DIAGNOSIS — C7951 Secondary malignant neoplasm of bone: Secondary | ICD-10-CM | POA: Diagnosis not present

## 2021-08-24 DIAGNOSIS — R0609 Other forms of dyspnea: Secondary | ICD-10-CM | POA: Insufficient documentation

## 2021-08-24 DIAGNOSIS — K219 Gastro-esophageal reflux disease without esophagitis: Secondary | ICD-10-CM | POA: Diagnosis not present

## 2021-08-24 DIAGNOSIS — I2510 Atherosclerotic heart disease of native coronary artery without angina pectoris: Secondary | ICD-10-CM | POA: Insufficient documentation

## 2021-08-24 LAB — CMP (CANCER CENTER ONLY)
ALT: 17 U/L (ref 0–44)
AST: 20 U/L (ref 15–41)
Albumin: 4.2 g/dL (ref 3.5–5.0)
Alkaline Phosphatase: 77 U/L (ref 38–126)
Anion gap: 5 (ref 5–15)
BUN: 11 mg/dL (ref 6–20)
CO2: 26 mmol/L (ref 22–32)
Calcium: 9.1 mg/dL (ref 8.9–10.3)
Chloride: 103 mmol/L (ref 98–111)
Creatinine: 0.91 mg/dL (ref 0.61–1.24)
GFR, Estimated: >60 mL/min (ref >60)
Glucose, Bld: 148 mg/dL — ABNORMAL HIGH (ref 70–99)
Potassium: 4.2 mmol/L (ref 3.5–5.1)
Sodium: 134 mmol/L — ABNORMAL LOW (ref 135–145)
Total Bilirubin: 0.6 mg/dL (ref 0.3–1.2)
Total Protein: 7 g/dL (ref 6.5–8.1)

## 2021-08-24 LAB — CBC WITH DIFFERENTIAL (CANCER CENTER ONLY)
Abs Immature Granulocytes: 0.04 10*3/uL (ref 0.00–0.07)
Basophils Absolute: 0.1 10*3/uL (ref 0.0–0.1)
Basophils Relative: 1 %
Eosinophils Absolute: 0.2 10*3/uL (ref 0.0–0.5)
Eosinophils Relative: 3 %
HCT: 38.6 % — ABNORMAL LOW (ref 39.0–52.0)
Hemoglobin: 12.8 g/dL — ABNORMAL LOW (ref 13.0–17.0)
Immature Granulocytes: 1 %
Lymphocytes Relative: 14 %
Lymphs Abs: 1.2 10*3/uL (ref 0.7–4.0)
MCH: 29.8 pg (ref 26.0–34.0)
MCHC: 33.2 g/dL (ref 30.0–36.0)
MCV: 89.8 fL (ref 80.0–100.0)
Monocytes Absolute: 0.7 10*3/uL (ref 0.1–1.0)
Monocytes Relative: 8 %
Neutro Abs: 6.1 10*3/uL (ref 1.7–7.7)
Neutrophils Relative %: 73 %
Platelet Count: 207 10*3/uL (ref 150–400)
RBC: 4.3 MIL/uL (ref 4.22–5.81)
RDW: 13.2 % (ref 11.5–15.5)
WBC Count: 8.3 10*3/uL (ref 4.0–10.5)
nRBC: 0 % (ref 0.0–0.2)

## 2021-08-24 LAB — TSH: TSH: 3.186 u[IU]/mL (ref 0.350–4.500)

## 2021-08-24 MED ORDER — SODIUM CHLORIDE 0.9 % IV SOLN
200.0000 mg | Freq: Once | INTRAVENOUS | Status: AC
  Administered 2021-08-24: 200 mg via INTRAVENOUS
  Filled 2021-08-24: qty 200

## 2021-08-24 MED ORDER — SODIUM CHLORIDE 0.9% FLUSH
10.0000 mL | INTRAVENOUS | Status: DC | PRN
  Administered 2021-08-24: 10 mL

## 2021-08-24 MED ORDER — SODIUM CHLORIDE 0.9 % IV SOLN: Freq: Once | INTRAVENOUS | Status: AC

## 2021-08-24 MED ORDER — HEPARIN SOD (PORK) LOCK FLUSH 100 UNIT/ML IV SOLN
500.0000 [IU] | Freq: Once | INTRAVENOUS | Status: AC | PRN
  Administered 2021-08-24: 500 [IU]

## 2021-08-24 NOTE — Patient Instructions (Signed)
[facility] MEDICAL [facility]COLOG[facility]  Discharge Instructions: ?Thank you for choosing [facility] to provide your oncology and hematology care.  ? ?If you have a lab appointment with the Cancer Center, please go directly to the Cancer Center and check in at the registration area. ?  ?Wear comfortable clothing and clothing appropriate for easy access to any Portacath or PICC line.  ? ?We strive to give you quality time with your provider. You may need to reschedule your appointment if you arrive late (15 or more minutes).  Arriving late affects you and other patients whose appointments are after yours.  Also, if you miss three or more appointments without notifying the office, you may be dismissed from the clinic at the provider?s discretion.    ?  ?For prescription refill requests, have your pharmacy contact our office and allow 72 hours for refills to be completed.   ? ?Today you received the following chemotherapy and/or immunotherapy agents: Keytruda ?  ?To help prevent nausea and vomiting after your treatment, we encourage you to take your nausea medication as directed. ? ?BELOW ARE SYMPTOMS THAT SHOULD BE REPORTED IMMEDIATELY: ?*FEVER GREATER THAN 100.4 F (38 ?C) OR HIGHER ?*CHILLS OR SWEATING ?*NAUSEA AND VOMITING THAT IS NOT CONTROLLED WITH YOUR NAUSEA MEDICATION ?*UNUSUAL SHORTNESS OF BREATH ?*UNUSUAL BRUISING OR BLEEDING ?*URINARY PROBLEMS (pain or burning when urinating, or frequent urination) ?*BOWEL PROBLEMS (unusual diarrhea, constipation, pain near the anus) ?TENDERNESS IN MOUTH AND THROAT WITH OR WITHOUT PRESENCE OF ULCERS (sore throat, sores in mouth, or a toothache) ?UNUSUAL RASH, SWELLING OR PAIN  ?UNUSUAL VAGINAL DISCHARGE OR ITCHING  ? ?Items with * indicate a potential emergency and should be followed up as soon as possible or go to the Emergency Department if any problems should occur. ? ?Please show the CHEMOTHERAPY ALERT CARD or IMMUNOTHERAPY ALERT CARD at check-in to the  Emergency Department and triage nurse. ? ?Should you have questions after your visit or need to cancel or reschedule your appointment, please contact [facility]  Dept: [phone]  and follow the prompts.  Office hours are 8:00 a.m. to 4:30 p.m. Monday - Friday. Please note that voicemails left after 4:00 p.m. may not be returned until the following business day.  We are closed weekends and major holidays. You have access to a nurse at all times for urgent questions. Please call the main number to the clinic Dept: [phone] and follow the prompts. ? ? ?For any non-urgent questions, you may also contact your provider using MyChart. We now offer e-Visits for anyone 18 and older to request care online for non-urgent symptoms. For details visit [email or web address]. ?  ?Also download the MyChart app! Go to the app store, search "MyChart", open the app, select [place], and log in with your MyChart username and password. ? ?Due to Covid, a mask is required upon entering the hospital/clinic. If you do not have a mask, one will be given to you upon arrival. For doctor visits, patients may have 1 support person aged 18 or older with them. For treatment visits, patients cannot have anyone with them due to current Covid guidelines and our immunocompromised population.  ? ?

## 2021-09-09 ENCOUNTER — Ambulatory Visit
Admission: RE | Admit: 2021-09-09 | Discharge: 2021-09-09 | Disposition: A | Discharge status: Home / Self Care | Payer: Managed Care, Other (non HMO) | Source: Ambulatory Visit | Attending: Internal Medicine | Admitting: Internal Medicine

## 2021-09-09 DIAGNOSIS — C3490 Malignant neoplasm of unspecified part of unspecified bronchus or lung: Secondary | ICD-10-CM

## 2021-09-09 DIAGNOSIS — C7931 Secondary malignant neoplasm of brain: Secondary | ICD-10-CM

## 2021-09-09 IMAGING — MR MR HEAD WO/W CM
12 series · 48 of 48 positions shown · IV contrast (17 ml multihance)
Comparison: [DATE]

CLINICAL DATA: Brain/CNS neoplasm, assess treatment response;
metastatic lung cancer follow-up

EXAM:
MRI HEAD WITHOUT AND WITH CONTRAST
TECHNIQUE: Multiplanar, multiecho pulse sequences of the brain and surrounding
structures were obtained without and with intravenous contrast.
CONTRAST:  17mL MULTIHANCE GADOBENATE DIMEGLUMINE 529 MG/ML IV SOLN
Contrast was administered via a port which was accessed by a
registered nurse.

[Series 2: FLAIR · sagittal · 3.0mm · 0.75mm/px · 3 of 39 slices shown (1 of 2)]
[im 1/39]
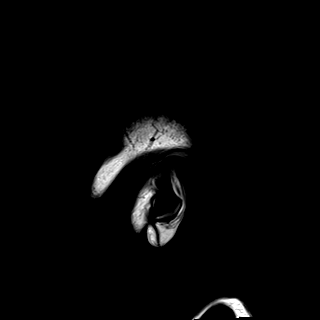
[im 20/39]
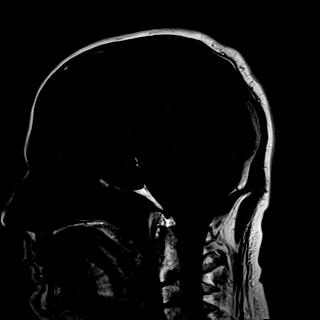
[im 39/39]
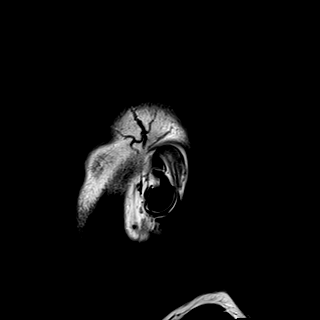

[Series 3: DWI · axial · 3.0mm · 1.50mm/px · z∈[-51,+98]mm · 4 of 78 slices shown (1 of 2)]
[im 1/78]
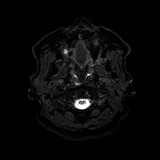
[im 26/78]
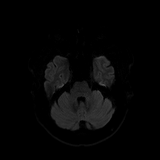
[im 52/78]
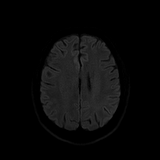
[im 78/78]
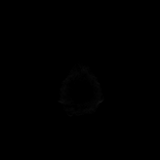

[Series 4: DWI · axial · 3.0mm · 1.50mm/px · z∈[-51,+98]mm · 2 of 39 slices shown (2 of 2)]
[im 1/39]
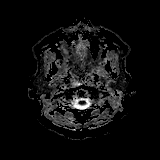
[im 39/39]
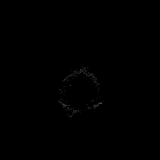

[Series 5: T2 · axial · 5.0mm · 0.57mm/px · 1 of 27 slices shown (1 of 2)]
[im 1/27]
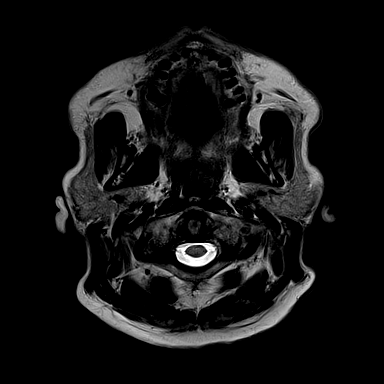

[Series 7: swi_images · axial · 1.5mm · 0.90mm/px · z∈[-48,+95]mm · 5 of 96 slices shown]
[im 1/96]
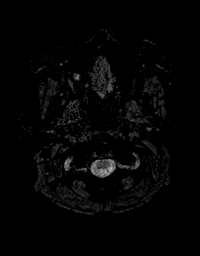
[im 24/96]
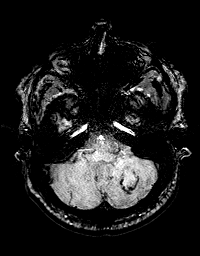
[im 48/96]
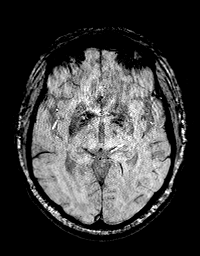
[im 72/96]
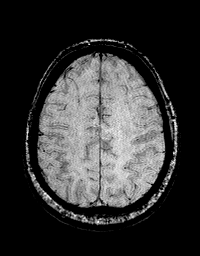
[im 96/96]
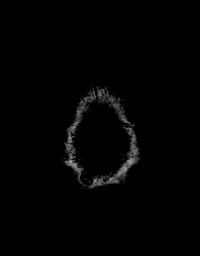

[Series 8: FLAIR · axial · 3.0mm · 0.86mm/px · z∈[-57,+105]mm · 3 of 55 slices shown (2 of 2)]
[im 1/55]
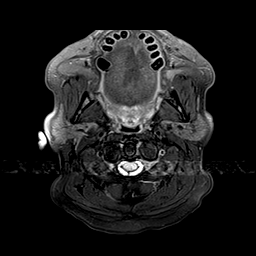
[im 28/55]
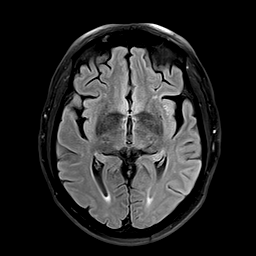
[im 55/55]
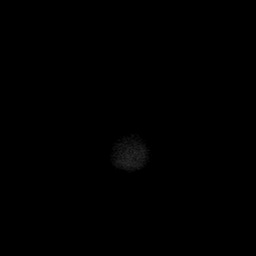

[Series 9: T2 · axial · non-contrast · 1.0mm · 0.86mm/px · z∈[-51,+105]mm · 8 of 160 slices shown (2 of 2)]
[im 1/160]
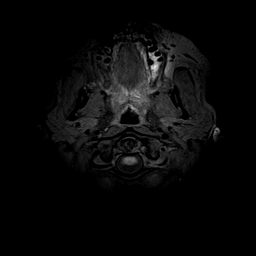
[im 23/160]
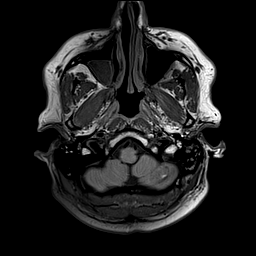
[im 46/160]
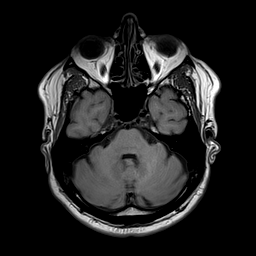
[im 69/160]
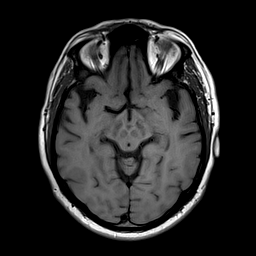
[im 91/160]
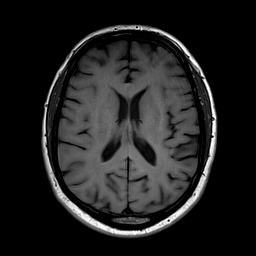
[im 114/160]
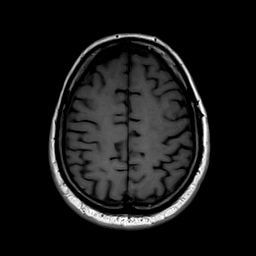
[im 137/160]
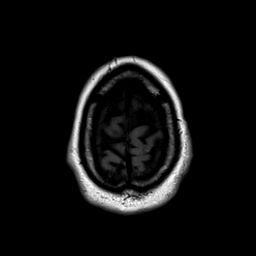
[im 160/160]
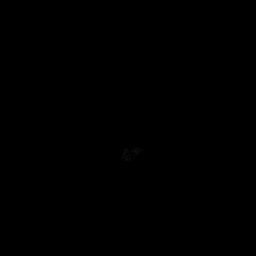

[Series 10: T2 post-contrast · coronal · 3.0mm · 0.57mm/px · 2 of 47 slices shown (1 of 2)]
[im 1/47]
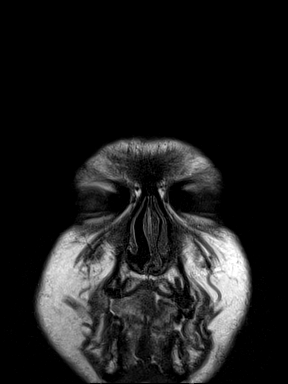
[im 47/47]
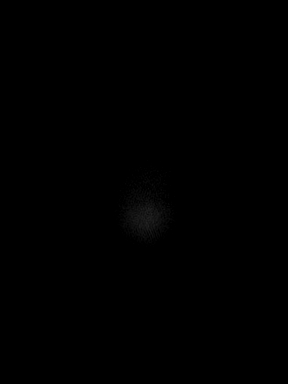

[Series 11: T2 post-contrast · axial · 1.0mm · 0.86mm/px · z∈[-51,+105]mm · 8 of 160 slices shown (2 of 2)]
[im 1/160]
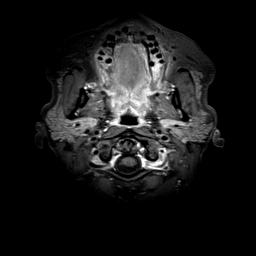
[im 23/160]
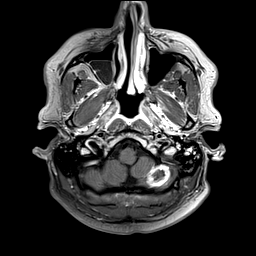
[im 46/160]
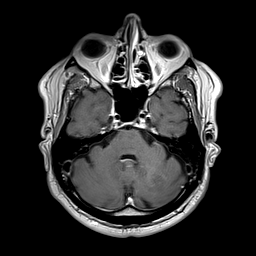
[im 69/160]
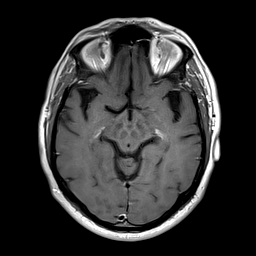
[im 91/160]
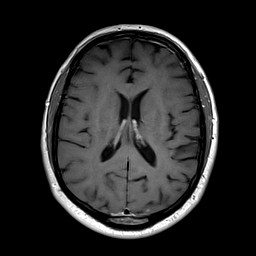
[im 114/160]
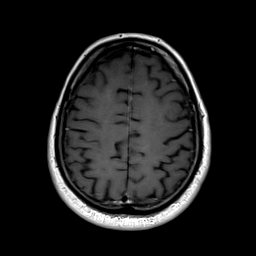
[im 137/160]
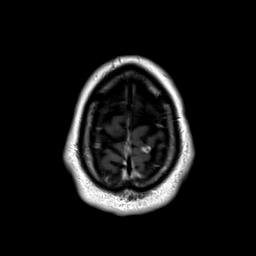
[im 160/160]
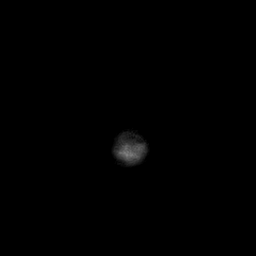

[Series 12: T1 post-contrast · axial · 1.0mm · 0.75mm/px · z∈[-52,+107]mm · 8 of 160 slices shown (1 of 2)]
[im 1/160]
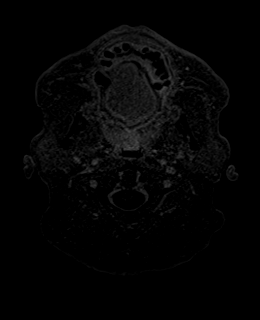
[im 23/160]
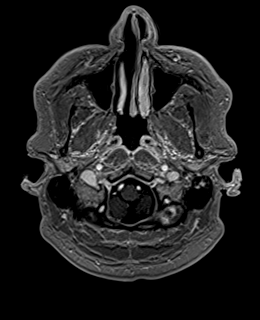
[im 46/160]
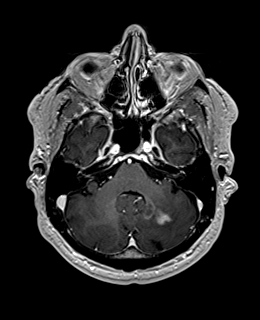
[im 69/160]
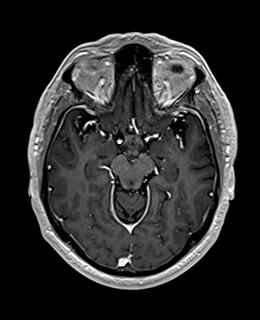
[im 91/160]
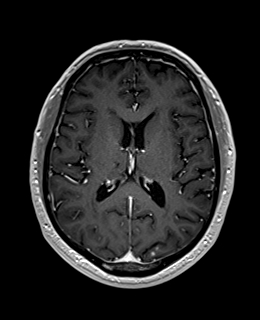
[im 114/160]
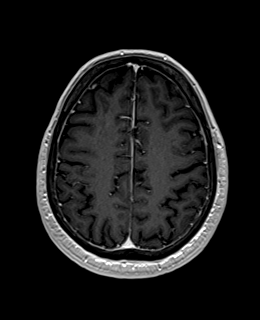
[im 137/160]
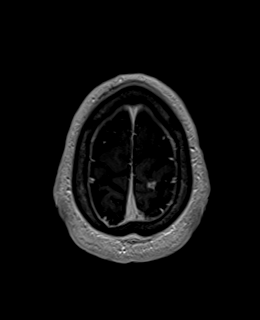
[im 160/160]
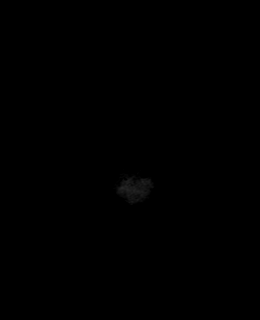

[Series 13: T1 post-contrast · coronal · 3.0mm · 0.57mm/px · 2 of 47 slices shown (2 of 2)]
[im 1/47]
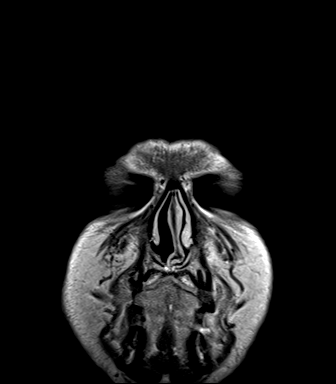
[im 47/47]
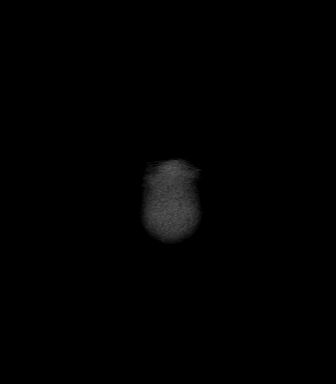

[Series 14: FLAIR post-contrast · sagittal · 3.0mm · 0.75mm/px · 2 of 39 slices shown]
[im 1/39]
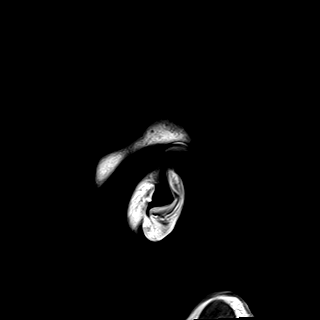
[im 39/39]
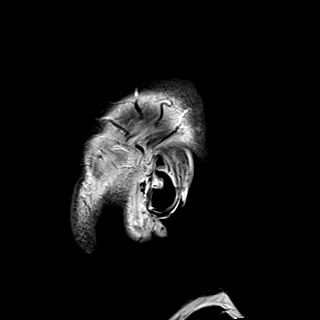

[48 of 48 positions shown; findings below may reference images not displayed]

FINDINGS: Brain: Left cerebellar peripherally enhancing lesion is similar in
size measuring up to 2.9 cm. Similar surrounding edema.

Similar punctate enhancement parasagittal left occipital lobe
(series 11, image 71).

Stable punctate enhancement in the right occipital lobe (image 84).

Stable adjacent 2 small foci of enhancement at the left
parieto-occipital junction (image 88).

Previously described punctate foci of enhancement in the right
thalamus and left posterior parietal lobe are not definitely
identified.

Stable 6 mm focus of enhancement of the left paracentral lobule
(image 137).

Stable 3 mm focus of enhancement of the right precentral gyrus
(image 123).

No new abnormal enhancement.  No new edema.

There is no acute infarction. Ventricles and sulci are stable in
size and configuration. No extra-axial collection. Other patchy foci
of T2 hyperintensity in the supratentorial and pontine white matter
are nonspecific but probably reflect stable chronic microvascular
ischemic changes. A small right frontal developmental venous anomaly
is again noted.

Vascular: Major vessel flow voids at the skull base are preserved.

Skull and upper cervical spine: Normal marrow signal is preserved.

Sinuses/Orbits: Paranasal sinuses are aerated. Orbits are
unremarkable.

Other: Sella is unremarkable.  Mastoid air cells are clear.
IMPRESSION: No evidence progressive or new disease.

## 2021-09-09 IMAGING — CT CT ABD-PELV W/ CM
3 series · 13 of 32 positions shown, 17 images · IV contrast (APPLIED)
Comparison: Multiple exams, including [DATE]

CLINICAL DATA: Metastatic non-small cell lung cancer restaging.
Prior chemotherapy, radiation therapy, and immunotherapy.

* Tracking Code: BO *
EXAM:
CT CHEST, ABDOMEN, AND PELVIS WITH CONTRAST
TECHNIQUE: Multidetector CT imaging of the chest, abdomen and pelvis was
performed following the standard protocol during bolus
administration of intravenous contrast.

[Series 2: chest/abd/pelvis w/cm · axial · 0.79mm/px · z∈[-396,+154]mm · 8 of 136 slices shown]
[im 13/136  soft-tissue]
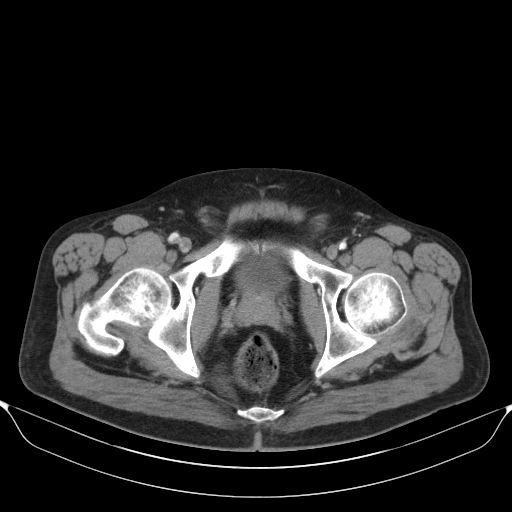
[im 25/136  soft-tissue]
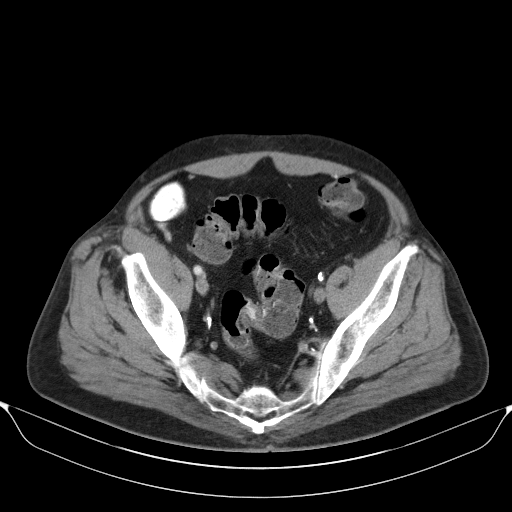
[im 50/136  soft-tissue]
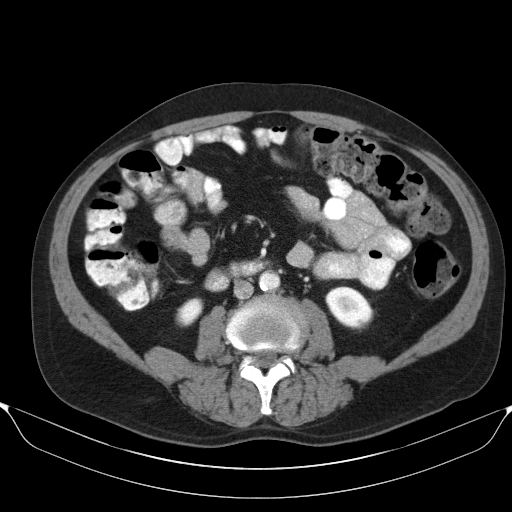
[im 62/136  soft-tissue]
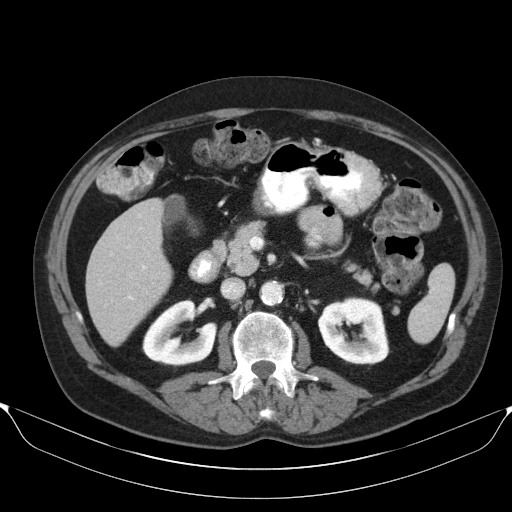
[im 74/136  soft-tissue]
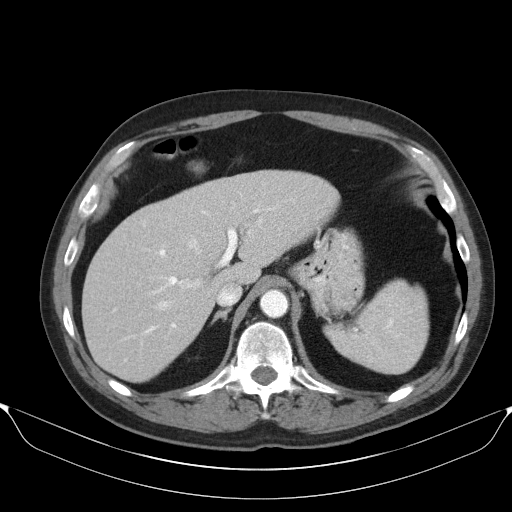
[im 86/136  soft-tissue]
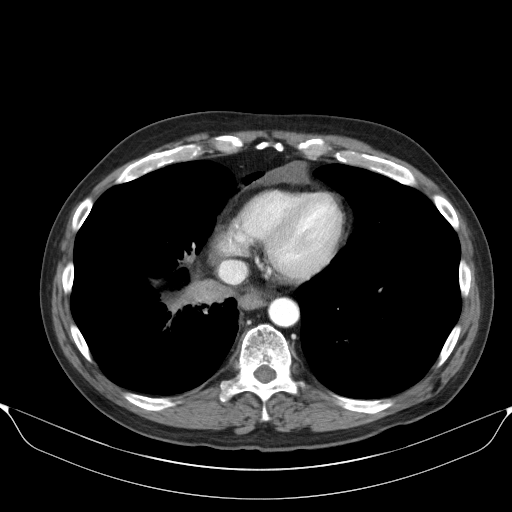
[im 111/136  soft-tissue]
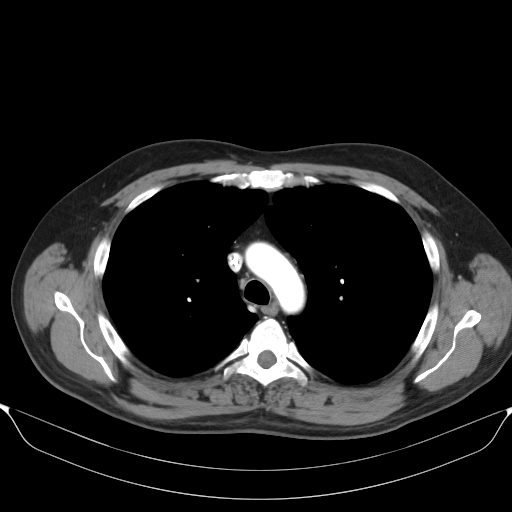
[im 123/136  soft-tissue]
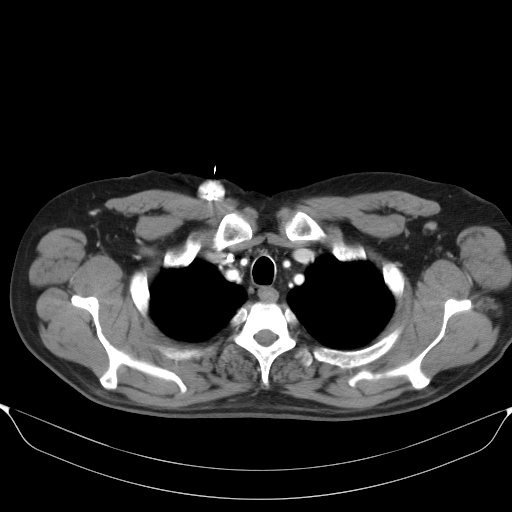

[Series 6: lung · axial · 0.80mm/px · z∈[-141,-93]mm · 2 of 193 slices shown]
[im 13/193  bone]
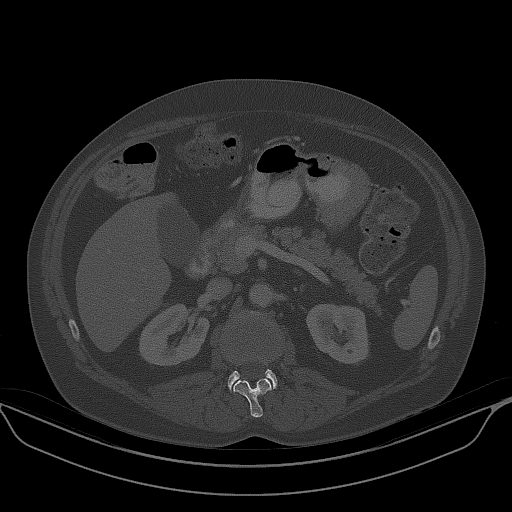
[im 37/193  bone]
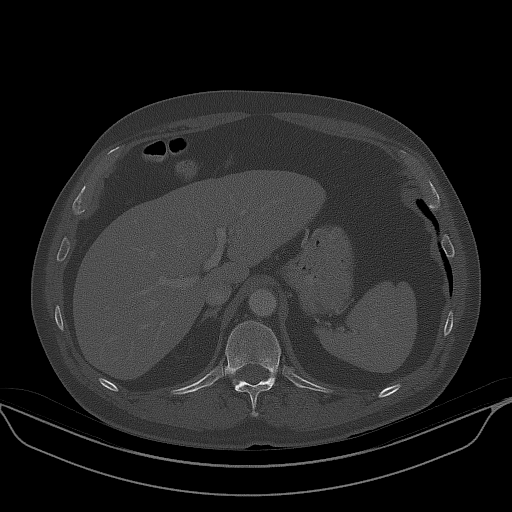

[Series 7: renal delay · axial · delayed · 0.79mm/px · z∈[-244,-74]mm · 3 of 35 slices shown, 7 images]
[im 1/35  soft-tissue]
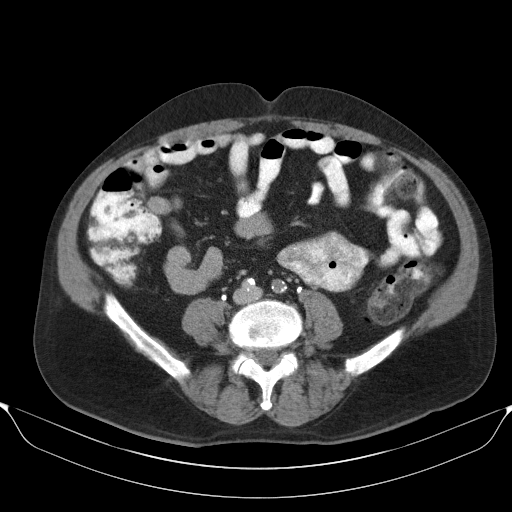
[im 1/35  lung]
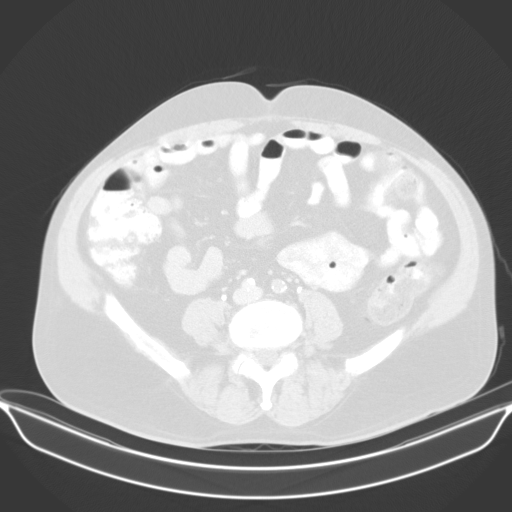
[im 1/35  bone]
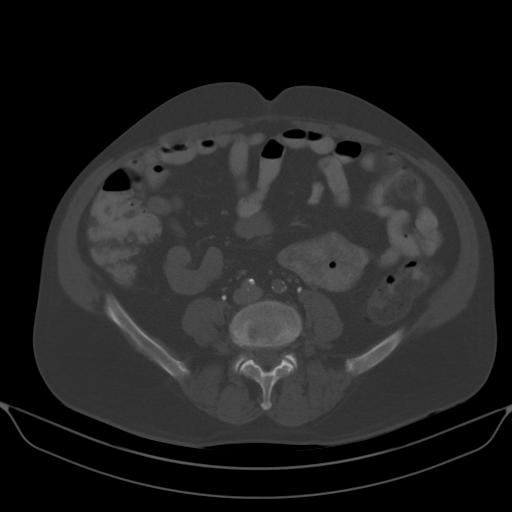
[im 18/35  soft-tissue]
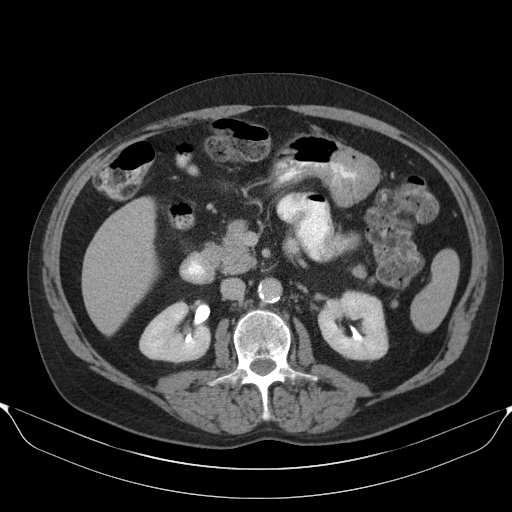
[im 18/35  lung]
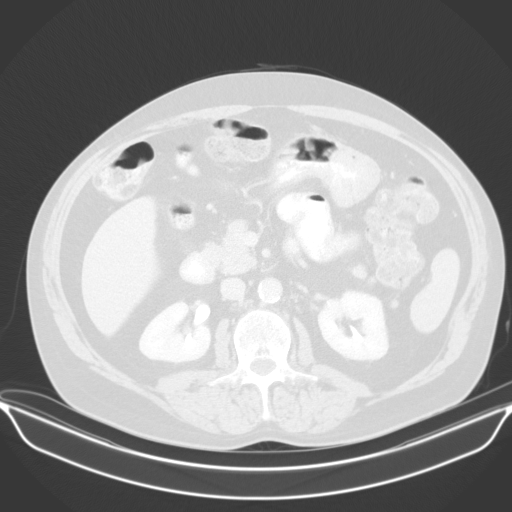
[im 35/35  soft-tissue]
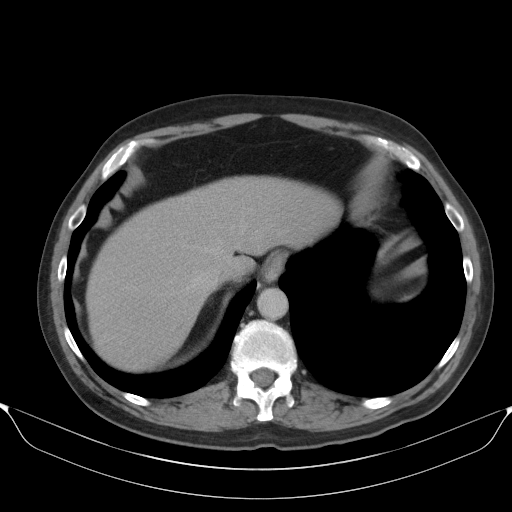
[im 35/35  lung]
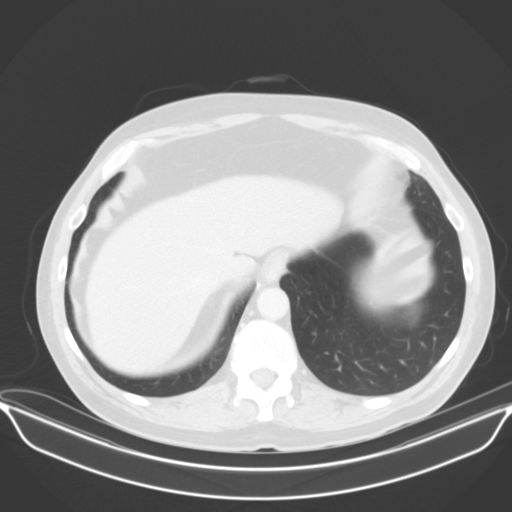

[13 of 32 positions shown; findings below may reference images not displayed]

RADIATION DOSE REDUCTION: This exam was performed according to the
departmental dose-optimization program which includes automated
exposure control, adjustment of the mA and/or kV according to
patient size and/or use of iterative reconstruction technique.

CONTRAST:  100mL [NA] IOPAMIDOL ([NA]) INJECTION 61%
FINDINGS: CT CHEST FINDINGS

Cardiovascular: Right Port-A-Cath tip: Cavoatrial junction. Left
anterior descending coronary artery atherosclerosis. Stable small
pericardial effusion.

Mediastinum/Nodes: Small right posterior tracheal diverticulum on
image 15 series 2, unchanged. No pathologic adenopathy.

Lungs/Pleura: Centrilobular emphysema. Unchanged therapy related
right infrahilar volume loss and consolidation in the right middle
lobe and right lower lobe without contour change or other specific
regional indicators of active malignancy. Airway thickening is
present, suggesting bronchitis or reactive airways disease.

Mildly increased conspicuity of faint and ill-defined ground-glass
densities in the left upper lobe and superior segment left lower
lobe along with some hazy peribronchovascular ground-glass density.
A representative ground-glass density nodule in the superior segment
left lower lobe measures 0.9 cm in diameter on image 83 series 6.
Inflammatory etiology favored particularly in light of the airway
thickening and mostly peribronchovascular distribution, surveillance
suggested.

Musculoskeletal: Mild bilateral gynecomastia. Chronically stable
atrophy of the left teres major muscle. Hemangioma in the T3
vertebral body eccentric to the right.

CT ABDOMEN PELVIS FINDINGS

Hepatobiliary: Unchanged focal low-attenuation centrally in the
lateral segment left hepatic lobe on image 62 series 2, negative on
prior PET imaging, compatible with small benign lesion. Otherwise
unremarkable.

Pancreas: Unremarkable

Spleen: Unremarkable

Adrenals/Urinary Tract: Tiny hypodense lesions of the left kidney
are technically too small to characterize although statistically
highly likely to be benign. Adrenal glands unremarkable. Urinary
bladder unremarkable.

Stomach/Bowel: Prominent stool throughout the colon favors
constipation.

Vascular/Lymphatic: Atherosclerosis is present, including aortoiliac
atherosclerotic disease. Chronic occlusion of the left iliac
arteries with reconstitution in the vicinity of the inferior
epigastric vessels. No pathologic adenopathy.

Reproductive: Unremarkable

Other: No supplemental non-categorized findings.

Musculoskeletal: Unremarkable
IMPRESSION: 1. Stable right infrahilar post therapy related findings in the
right middle lobe and right lower lobe.
2. Airway thickening is present, suggesting bronchitis or reactive
airways disease. Faint peribronchovascular ground-glass opacities in
the left upper lobe and superior segment left lower lobe along with
some subtle ground-glass density nodularity which is likely
inflammatory but which merits surveillance.
3. Other imaging findings of potential clinical significance: Aortic
Atherosclerosis ([NA]-[NA]). Coronary and systemic atherosclerosis
with chronic occlusion of the left iliac arteries with external
iliac reconstitution from the left inferior epigastric artery.
Emphysema ([NA]-[NA]). Chronically stable and not hypermetabolic
central left lateral segment hepatic lobe hypodense lesion,
compatible with benign etiology. Prominent stool throughout the
colon favors constipation. Small stable pericardial effusion.

## 2021-09-09 IMAGING — CT CT CHEST W/ CM
1 of 4 series · 13 of 32 positions shown, 17 images · IV contrast (APPLIED)
Comparison: Multiple exams, including [DATE]

CLINICAL DATA: Metastatic non-small cell lung cancer restaging.
Prior chemotherapy, radiation therapy, and immunotherapy.

* Tracking Code: BO *
EXAM:
CT CHEST, ABDOMEN, AND PELVIS WITH CONTRAST
TECHNIQUE: Multidetector CT imaging of the chest, abdomen and pelvis was
performed following the standard protocol during bolus
administration of intravenous contrast.

[Series 5: super d · axial · 0.79mm/px · z∈[-427,+186]mm · 13 of 849 slices shown, 17 images]
[im 41/849  soft-tissue]
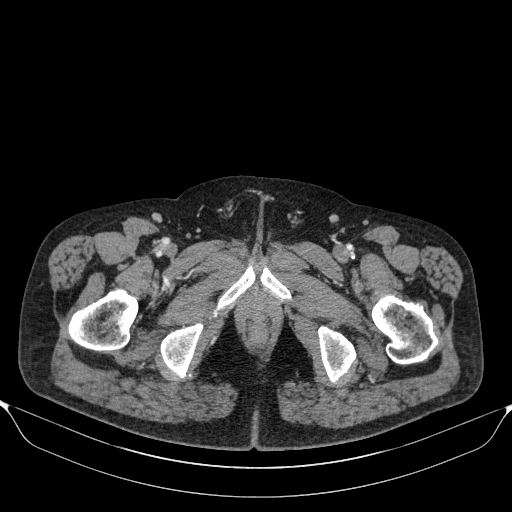
[im 41/849  bone]
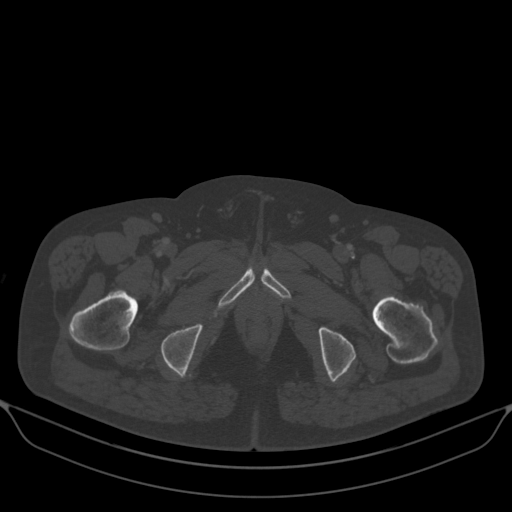
[im 122/849  soft-tissue]
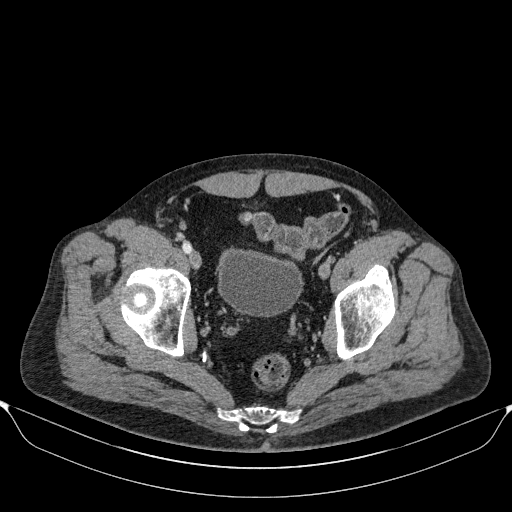
[im 202/849  soft-tissue]
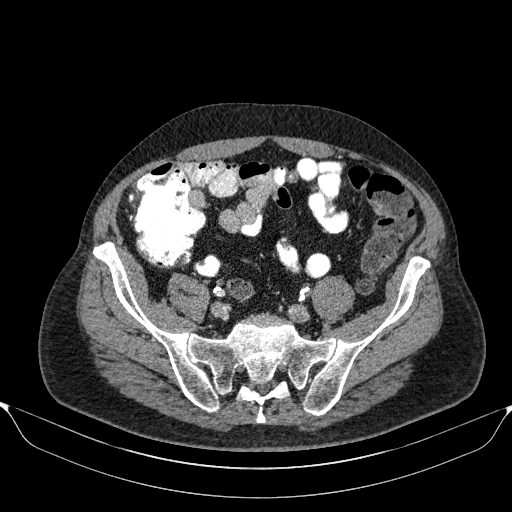
[im 283/849  soft-tissue]
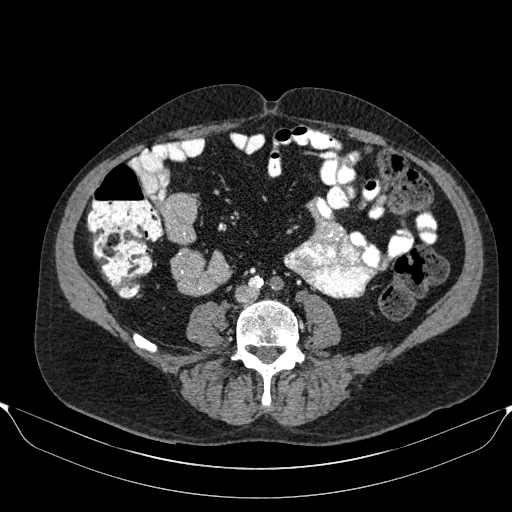
[im 364/849  soft-tissue]
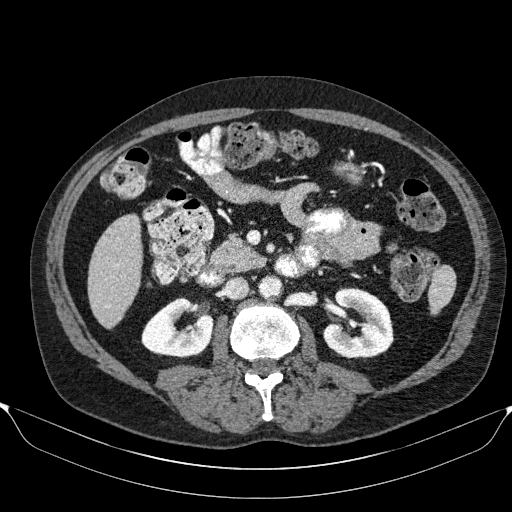
[im 445/849  soft-tissue]
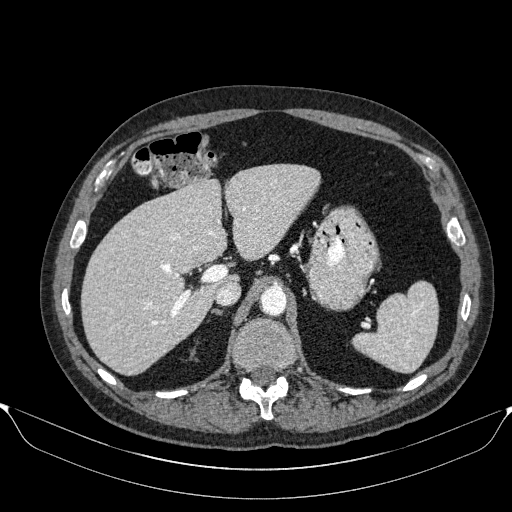
[im 485/849  soft-tissue]
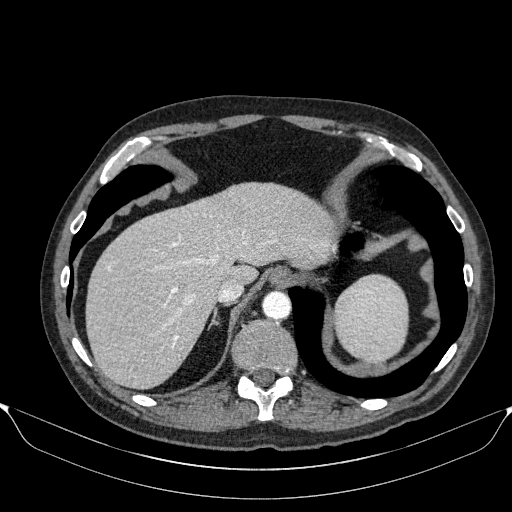
[im 566/849  soft-tissue]
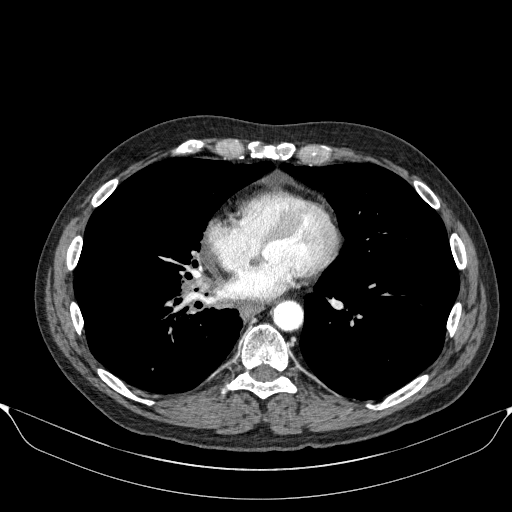
[im 647/849  soft-tissue]
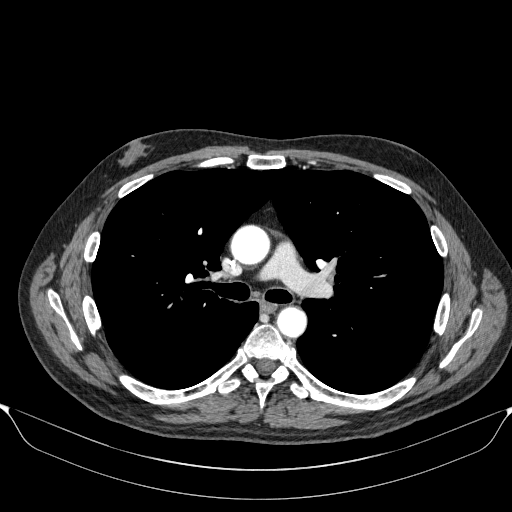
[im 647/849  bone]
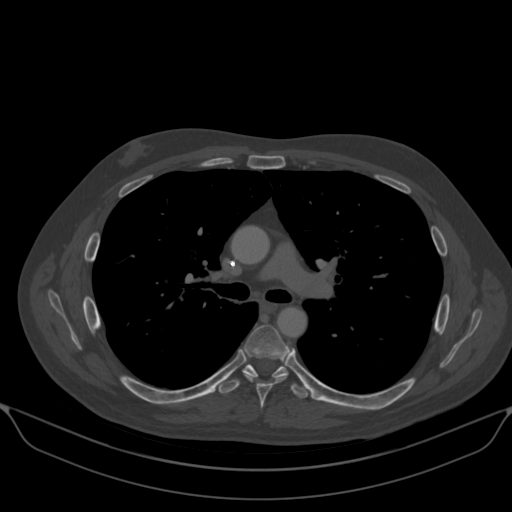
[im 687/849  lung]
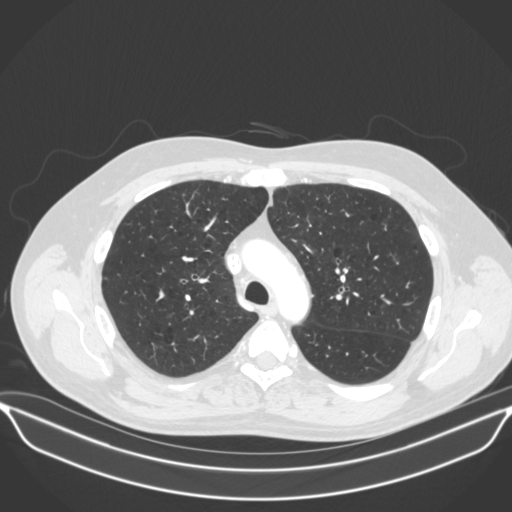
[im 727/849  soft-tissue]
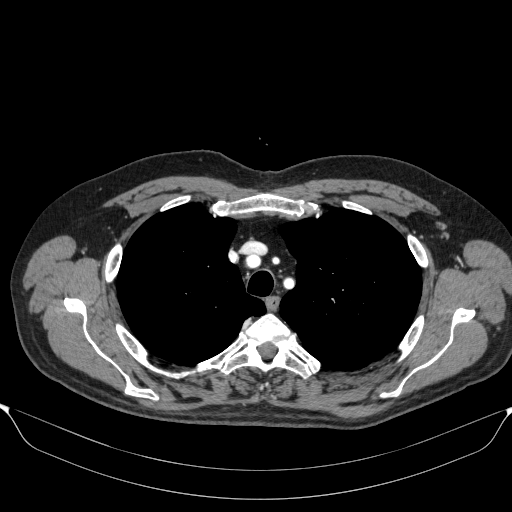
[im 727/849  lung]
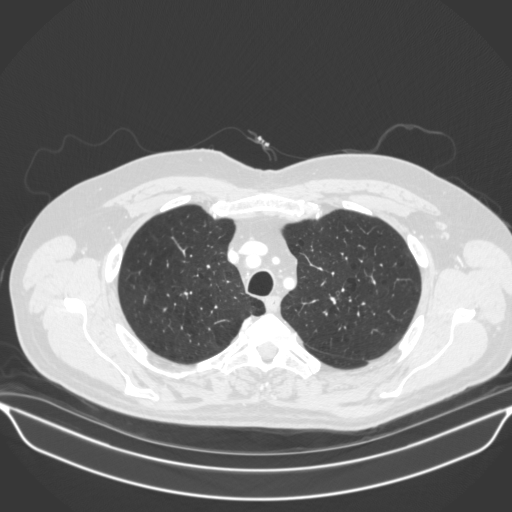
[im 768/849  lung]
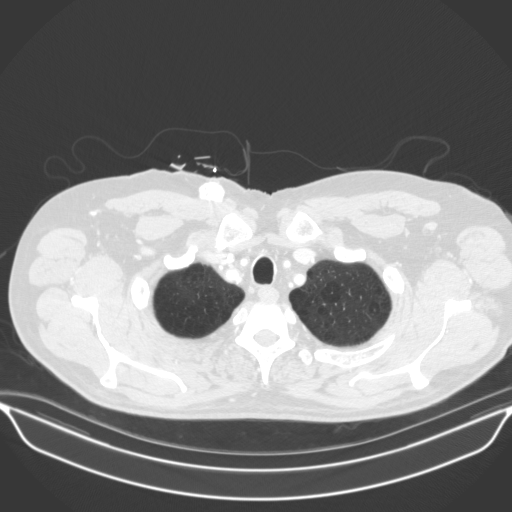
[im 808/849  soft-tissue]
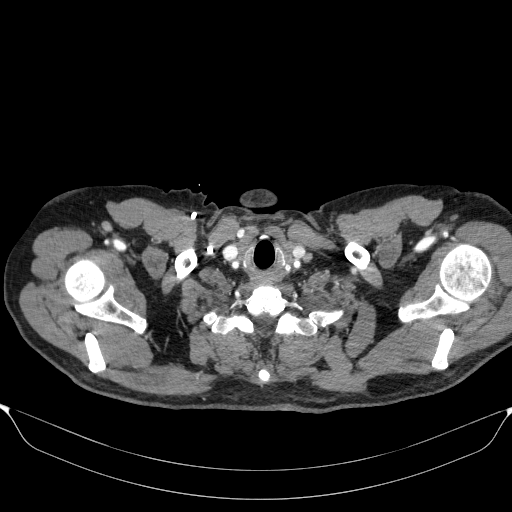
[im 808/849  lung]
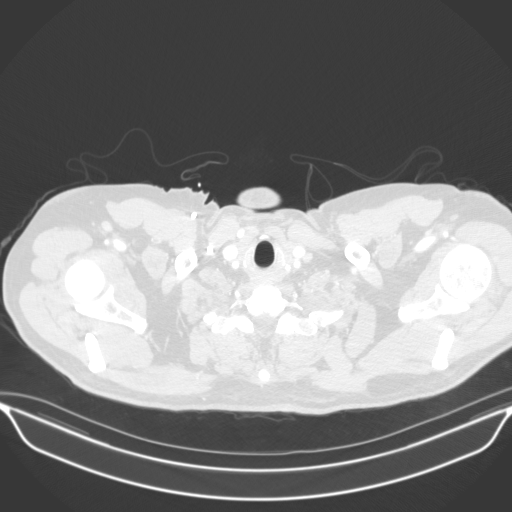

[13 of 32 positions shown; findings below may reference images not displayed]

RADIATION DOSE REDUCTION: This exam was performed according to the
departmental dose-optimization program which includes automated
exposure control, adjustment of the mA and/or kV according to
patient size and/or use of iterative reconstruction technique.

CONTRAST:  100mL [NA] IOPAMIDOL ([NA]) INJECTION 61%
FINDINGS: CT CHEST FINDINGS

Cardiovascular: Right Port-A-Cath tip: Cavoatrial junction. Left
anterior descending coronary artery atherosclerosis. Stable small
pericardial effusion.

Mediastinum/Nodes: Small right posterior tracheal diverticulum on
image 15 series 2, unchanged. No pathologic adenopathy.

Lungs/Pleura: Centrilobular emphysema. Unchanged therapy related
right infrahilar volume loss and consolidation in the right middle
lobe and right lower lobe without contour change or other specific
regional indicators of active malignancy. Airway thickening is
present, suggesting bronchitis or reactive airways disease.

Mildly increased conspicuity of faint and ill-defined ground-glass
densities in the left upper lobe and superior segment left lower
lobe along with some hazy peribronchovascular ground-glass density.
A representative ground-glass density nodule in the superior segment
left lower lobe measures 0.9 cm in diameter on image 83 series 6.
Inflammatory etiology favored particularly in light of the airway
thickening and mostly peribronchovascular distribution, surveillance
suggested.

Musculoskeletal: Mild bilateral gynecomastia. Chronically stable
atrophy of the left teres major muscle. Hemangioma in the T3
vertebral body eccentric to the right.

CT ABDOMEN PELVIS FINDINGS

Hepatobiliary: Unchanged focal low-attenuation centrally in the
lateral segment left hepatic lobe on image 62 series 2, negative on
prior PET imaging, compatible with small benign lesion. Otherwise
unremarkable.

Pancreas: Unremarkable

Spleen: Unremarkable

Adrenals/Urinary Tract: Tiny hypodense lesions of the left kidney
are technically too small to characterize although statistically
highly likely to be benign. Adrenal glands unremarkable. Urinary
bladder unremarkable.

Stomach/Bowel: Prominent stool throughout the colon favors
constipation.

Vascular/Lymphatic: Atherosclerosis is present, including aortoiliac
atherosclerotic disease. Chronic occlusion of the left iliac
arteries with reconstitution in the vicinity of the inferior
epigastric vessels. No pathologic adenopathy.

Reproductive: Unremarkable

Other: No supplemental non-categorized findings.

Musculoskeletal: Unremarkable
IMPRESSION: 1. Stable right infrahilar post therapy related findings in the
right middle lobe and right lower lobe.
2. Airway thickening is present, suggesting bronchitis or reactive
airways disease. Faint peribronchovascular ground-glass opacities in
the left upper lobe and superior segment left lower lobe along with
some subtle ground-glass density nodularity which is likely
inflammatory but which merits surveillance.
3. Other imaging findings of potential clinical significance: Aortic
Atherosclerosis ([NA]-[NA]). Coronary and systemic atherosclerosis
with chronic occlusion of the left iliac arteries with external
iliac reconstitution from the left inferior epigastric artery.
Emphysema ([NA]-[NA]). Chronically stable and not hypermetabolic
central left lateral segment hepatic lobe hypodense lesion,
compatible with benign etiology. Prominent stool throughout the
colon favors constipation. Small stable pericardial effusion.

## 2021-09-09 MED ORDER — HEPARIN SOD (PORK) LOCK FLUSH 100 UNIT/ML IV SOLN
500.0000 [IU] | Freq: Once | INTRAVENOUS | Status: AC
  Administered 2021-09-09: 500 [IU] via INTRAVENOUS

## 2021-09-09 MED ORDER — GADOBENATE DIMEGLUMINE 529 MG/ML IV SOLN
17.0000 mL | Freq: Once | INTRAVENOUS | Status: AC | PRN
  Administered 2021-09-09: 17 mL via INTRAVENOUS

## 2021-09-09 MED ORDER — IOPAMIDOL (ISOVUE-300) INJECTION 61%
100.0000 mL | Freq: Once | INTRAVENOUS | Status: AC | PRN
  Administered 2021-09-09: 100 mL via INTRAVENOUS

## 2021-09-09 MED ORDER — SODIUM CHLORIDE 0.9% FLUSH
10.0000 mL | INTRAVENOUS | Status: DC | PRN
  Administered 2021-09-09: 10 mL via INTRAVENOUS

## 2021-09-13 ENCOUNTER — Inpatient Hospital Stay: Payer: Managed Care, Other (non HMO)

## 2021-09-14 ENCOUNTER — Inpatient Hospital Stay (HOSPITAL_BASED_OUTPATIENT_CLINIC_OR_DEPARTMENT_OTHER): Payer: Managed Care, Other (non HMO) | Admitting: Internal Medicine

## 2021-09-14 ENCOUNTER — Other Ambulatory Visit: Payer: Managed Care, Other (non HMO)

## 2021-09-14 ENCOUNTER — Other Ambulatory Visit: Payer: Self-pay

## 2021-09-14 ENCOUNTER — Inpatient Hospital Stay: Payer: Managed Care, Other (non HMO)

## 2021-09-14 VITALS — BP 130/77 | HR 98 | Temp 97.7°F | Resp 20 | Wt 183.7 lb

## 2021-09-14 VITALS — BP 127/86 | HR 98 | Temp 97.6°F | Resp 16 | Wt 183.9 lb

## 2021-09-14 DIAGNOSIS — C3490 Malignant neoplasm of unspecified part of unspecified bronchus or lung: Secondary | ICD-10-CM

## 2021-09-14 DIAGNOSIS — C3491 Malignant neoplasm of unspecified part of right bronchus or lung: Secondary | ICD-10-CM

## 2021-09-14 DIAGNOSIS — C7931 Secondary malignant neoplasm of brain: Secondary | ICD-10-CM | POA: Diagnosis not present

## 2021-09-14 DIAGNOSIS — Z5112 Encounter for antineoplastic immunotherapy: Secondary | ICD-10-CM

## 2021-09-14 DIAGNOSIS — Z95828 Presence of other vascular implants and grafts: Secondary | ICD-10-CM

## 2021-09-14 LAB — CBC WITH DIFFERENTIAL (CANCER CENTER ONLY)
Abs Immature Granulocytes: 0.05 10*3/uL (ref 0.00–0.07)
Basophils Absolute: 0.1 10*3/uL (ref 0.0–0.1)
Basophils Relative: 1 %
Eosinophils Absolute: 0.2 10*3/uL (ref 0.0–0.5)
Eosinophils Relative: 2 %
HCT: 40.2 % (ref 39.0–52.0)
Hemoglobin: 13.7 g/dL (ref 13.0–17.0)
Immature Granulocytes: 1 %
Lymphocytes Relative: 14 %
Lymphs Abs: 1.4 10*3/uL (ref 0.7–4.0)
MCH: 30.4 pg (ref 26.0–34.0)
MCHC: 34.1 g/dL (ref 30.0–36.0)
MCV: 89.1 fL (ref 80.0–100.0)
Monocytes Absolute: 0.8 10*3/uL (ref 0.1–1.0)
Monocytes Relative: 9 %
Neutro Abs: 7.3 10*3/uL (ref 1.7–7.7)
Neutrophils Relative %: 73 %
Platelet Count: 228 10*3/uL (ref 150–400)
RBC: 4.51 MIL/uL (ref 4.22–5.81)
RDW: 13.2 % (ref 11.5–15.5)
WBC Count: 9.9 10*3/uL (ref 4.0–10.5)
nRBC: 0 % (ref 0.0–0.2)

## 2021-09-14 LAB — CMP (CANCER CENTER ONLY)
ALT: 21 U/L (ref 0–44)
AST: 24 U/L (ref 15–41)
Albumin: 4.4 g/dL (ref 3.5–5.0)
Alkaline Phosphatase: 82 U/L (ref 38–126)
Anion gap: 5 (ref 5–15)
BUN: 12 mg/dL (ref 6–20)
CO2: 27 mmol/L (ref 22–32)
Calcium: 9.4 mg/dL (ref 8.9–10.3)
Chloride: 102 mmol/L (ref 98–111)
Creatinine: 0.88 mg/dL (ref 0.61–1.24)
GFR, Estimated: >60 mL/min (ref >60)
Glucose, Bld: 122 mg/dL — ABNORMAL HIGH (ref 70–99)
Potassium: 4.3 mmol/L (ref 3.5–5.1)
Sodium: 134 mmol/L — ABNORMAL LOW (ref 135–145)
Total Bilirubin: 0.6 mg/dL (ref 0.3–1.2)
Total Protein: 7.6 g/dL (ref 6.5–8.1)

## 2021-09-14 LAB — TSH: TSH: 4.4 u[IU]/mL (ref 0.350–4.500)

## 2021-09-14 MED ORDER — SODIUM CHLORIDE 0.9 % IV SOLN: Freq: Once | INTRAVENOUS | Status: AC

## 2021-09-14 MED ORDER — SODIUM CHLORIDE 0.9% FLUSH
10.0000 mL | INTRAVENOUS | Status: DC | PRN
  Administered 2021-09-14: 10 mL

## 2021-09-14 MED ORDER — HEPARIN SOD (PORK) LOCK FLUSH 100 UNIT/ML IV SOLN
500.0000 [IU] | Freq: Once | INTRAVENOUS | Status: AC | PRN
  Administered 2021-09-14: 500 [IU]

## 2021-09-14 MED ORDER — SODIUM CHLORIDE 0.9 % IV SOLN
200.0000 mg | Freq: Once | INTRAVENOUS | Status: AC
  Administered 2021-09-14: 200 mg via INTRAVENOUS
  Filled 2021-09-14: qty 200

## 2021-09-14 NOTE — Progress Notes (Signed)
[facility] [address] [place], [place] [address] [phone]   Interval Evaluation  Date of Service: 09/14/21 Patient Name: Joseph Atkins Patient MRN: [id number] Patient DOB: 1960-12-07 Provider: Ventura Sellers, MD  Identifying Statement:  Joseph Atkins is a 61 y.o. male with Lung cancer metastatic to brain ([place]) [C34.90, C79.31]   Primary Cancer:  Oncologic History: Oncology History  Adenocarcinoma of right lung, stage 4 ([place])  11/14/2019 Initial Diagnosis   Adenocarcinoma of right lung, stage 4 ([place])    11/25/2019 - 01/06/2020 Chemotherapy   The patient had palonosetron (ALOXI) injection 0.25 mg, 0.25 mg, Intravenous,  Once, 7 of 7 cycles Administration: 0.25 mg (11/25/2019), 0.25 mg (12/23/2019), 0.25 mg (12/31/2019), 0.25 mg (12/02/2019), 0.25 mg (01/06/2020), 0.25 mg (12/09/2019), 0.25 mg (12/16/2019) CARBOplatin (PARAPLATIN) 250 mg in sodium chloride 0.9 % 250 mL chemo infusion, 249.8 mg (100 % of original dose 249.8 mg), Intravenous,  Once, 7 of 7 cycles Dose modification: 249.8 mg (original dose 249.8 mg, Cycle 1) Administration: 250 mg (11/25/2019), 250 mg (12/23/2019), 250 mg (12/31/2019), 250 mg (12/02/2019), 250 mg (01/06/2020), 270 mg (12/09/2019), 250 mg (12/16/2019) PACLitaxel (TAXOL) 90 mg in sodium chloride 0.9 % 250 mL chemo infusion (</= 80mg /m2), 45 mg/m2 = 90 mg, Intravenous,  Once, 7 of 7 cycles Administration: 90 mg (11/25/2019), 90 mg (12/23/2019), 90 mg (12/31/2019), 90 mg (12/02/2019), 90 mg (01/06/2020), 90 mg (12/09/2019), 90 mg (12/16/2019)   for chemotherapy treatment.     02/18/2020 -  Chemotherapy   Patient is on Treatment Plan : LUNG NSCLC flat dose Pembrolizumab Q21D      03/31/2020 - 07/21/2020 Chemotherapy    Patient is on Treatment Plan: LUNG NSCLC FLAT DOSE PEMBROLIZUMAB Q21D       05/19/2020 Cancer Staging   Staging form: Lung, AJCC 8th Edition - Clinical: Stage IVB (cT3, cN0, cM1c) - Signed by Curt Bears,  MD on 05/19/2020     CNS Oncologic History 11/14/19: Completes single frx SRS to 2.4cm cerebellar metastasis 03/05/20: SRS to 7 targets Joseph Atkins) 06/19/20: SRS to 6 additional targets Joseph Atkins)    Interval History:  Joseph Atkins presents today following recent MRI brain.  No new or progressive deficits today.  He does complain of ongoing fatigue and low energy, not worse from prior. No recurrence of headaches. Continues on Keytruda therapy.  H+P (01/24/20) Patient presents today to discuss recent recurrence of neurologic symptoms following radiosurgery in July 2021.  He describes poor balance, wide based walking, needing to hold on to family or objects to walk safely.  He also describes nausea and dizzy-headed feeling at times.  Overall he is functioning in an impaired and sluggish way.  Symptoms have become noticeable since decreasing decadron to less than 4mg  daily; currently he is dosing 2mg  daily.  He felt "quite good" immediately after radiation and during higher dose steroid therapy.  No complications from decadron that he can report.  Recently completed chemoradioatherapy induction for lung adenocarcinoma with Dr. Julien Nordmann.  Medications: Current Outpatient Medications on File Prior to Visit  Medication Sig Dispense Refill   albuterol (PROVENTIL HFA;VENTOLIN HFA) 108 (90 Base) MCG/ACT inhaler Inhale 2 puffs into the lungs every 6 (six) hours as needed for wheezing or shortness of breath. 1 Inhaler 0   amLODipine (NORVASC) 5 MG tablet TAKE 1 TABLET (5 MG TOTAL) BY MOUTH DAILY. 90 tablet 1   ANORO ELLIPTA 62.5-25 MCG/INH AEPB 1 puff daily.     aspirin  EC 81 MG tablet Take 1 tablet (81 mg total) by mouth daily. Swallow whole. 30 tablet 11   cyclobenzaprine (FLEXERIL) 10 MG tablet Take 10 mg by mouth 2 (two) times daily as needed for muscle spasms.     docusate sodium (COLACE) 100 MG capsule Take 100 mg by mouth daily.     DULoxetine (CYMBALTA) 20 MG capsule Take 1 capsule (20 mg total) by  mouth 2 (two) times daily. 60 capsule 5   fluticasone furoate-vilanterol (BREO ELLIPTA) 100-25 MCG/INH AEPB Inhale 1 puff into the lungs daily. 30 each 3   levothyroxine (SYNTHROID) 100 MCG tablet TAKE 1 TABLET BY MOUTH EVERY DAY 90 tablet 1   lidocaine-prilocaine (EMLA) cream Apply 1 application topically as needed. 30 g 0   omeprazole (PRILOSEC) 40 MG capsule Take 1 capsule (40 mg total) by mouth daily. 90 capsule 4   ondansetron (ZOFRAN) 4 MG tablet Take 1 tablet (4 mg total) by mouth every 8 (eight) hours as needed. 40 tablet 2   oxyCODONE-acetaminophen (PERCOCET/ROXICET) 5-325 MG tablet Take 1 tablet by mouth every 4 (four) hours as needed for severe pain. 30 tablet 0   pembrolizumab (KEYTRUDA) 100 MG/4ML SOLN See admin instructions.     polyethylene glycol powder (MIRALAX) powder Take 17 g by mouth daily. 255 g 11   prochlorperazine (COMPAZINE) 10 MG tablet Take 1 tablet (10 mg total) by mouth every 6 (six) hours as needed. 30 tablet 2   triamcinolone cream (KENALOG) 0.1 % Apply topically 2 (two) times daily as needed. 453.6 g 0   TRULANCE 3 MG TABS Take 1 tablet by mouth daily.     zolpidem (AMBIEN CR) 12.5 MG CR tablet Take 1 tablet (12.5 mg total) by mouth at bedtime as needed for sleep. 30 tablet 5   No current facility-administered medications on file prior to visit.    Allergies:  Allergies  Allergen Reactions   Penicillins Other (See Comments)    Childhood allergy.  Has patient had a PCN reaction causing immediate rash, facial/tongue/throat swelling, SOB or lightheadedness with hypotension: unknown Has patient had a PCN reaction causing severe rash involving mucus membranes or skin necrosis: unknown Has patient had a PCN reaction that required hospitalization: unknown Has patient had a PCN reaction occurring within the last 10 years: no If all of the above answers are "NO", then may proceed with Cephalosporin use.    Past Medical History:  Past Medical History:  Diagnosis  Date   Cancer ([facility])    lung cancer   Diverticulosis    GERD (gastroesophageal reflux disease)    Hx of small bowel obstruction    Hyperlipidemia    Hypertension    Hypothyroidism    IBS (irritable bowel syndrome)    Substance abuse ([facility])    Alcoholic, Drug addition   Thyroid disease    Past Surgical History:  Past Surgical History:  Procedure Laterality Date   arm surgery Right    BRONCHIAL BRUSHINGS  10/24/2019   Procedure: BRONCHIAL BRUSHINGS;  Surgeon: Collene Gobble, MD;  Location: [facility];  Service: Cardiopulmonary;;  right lower lobe    BRONCHIAL BRUSHINGS  11/05/2019   Procedure: BRONCHIAL BRUSHINGS;  Surgeon: Collene Gobble, MD;  Location: [facility];  Service: Pulmonary;;   BRONCHIAL NEEDLE ASPIRATION BIOPSY  10/24/2019   Procedure: BRONCHIAL NEEDLE ASPIRATION BIOPSIES;  Surgeon: Collene Gobble, MD;  Location: [facility];  Service: Cardiopulmonary;;   BRONCHIAL NEEDLE ASPIRATION BIOPSY  11/05/2019   Procedure: BRONCHIAL NEEDLE ASPIRATION  BIOPSIES;  Surgeon: Collene Gobble, MD;  Location[facility] ENDOSCOPY;  Service: Pulmonary;;   ENDOBRONCHIAL ULTRASOUND N/A 10/24/2019   Procedure: ENDOBRONCHIAL ULTRASOUND;  Surgeon: Collene Gobble, MD;  Location: [facility];  Service: Cardiopulmonary;  Laterality: N/A;   FINGER SURGERY Right    Middle   IR IMAGING GUIDED PORT INSERTION  11/19/2019   VIDEO BRONCHOSCOPY N/A 10/24/2019   Procedure: VIDEO BRONCHOSCOPY WITHOUT FLUORO;  Surgeon: Collene Gobble, MD;  Location: [facility];  Service: Cardiopulmonary;  Laterality: N/A;   VIDEO BRONCHOSCOPY WITH ENDOBRONCHIAL NAVIGATION N/A 11/05/2019   Procedure: VIDEO BRONCHOSCOPY WITH ENDOBRONCHIAL NAVIGATION;  Surgeon: Collene Gobble, MD;  Location: [facility] ENDOSCOPY;  Service: Pulmonary;  Laterality: N/A;   Social History:  Social History   Socioeconomic History   Marital status: Married    Spouse name: Not on file   Number of children: 2   Years of education: Not on file   Highest  education level: Not on file  Occupational History   Occupation: supervisor    Employer: [employer]  Tobacco Use   Smoking status: Former    Packs/day: 1.50    Types: Cigarettes   Smokeless tobacco: Former    Types: Nurse, children's Use: Never used  Substance and Sexual Activity   Alcohol use: No    Comment: Alcoholic clean for 6 years   Drug use: No    Comment: Recovering Drug Addict-clean for 6 years   Sexual activity: Not on file  Other Topics Concern   Not on file  Social History Narrative   Not on file   Social Determinants of Health   Financial Resource Strain: Not on file  Food Insecurity: Not on file  Transportation Needs: Not on file  Physical Activity: Not on file  Stress: Not on file  Social Connections: Not on file  Intimate Partner Violence: Not on file   Family History:  Family History  Problem Relation Age of Onset   Epilepsy Mother    Heart disease Mother    Heart disease Brother    Lung cancer Paternal Uncle     Review of Systems: Constitutional: Doesn't report fevers, chills or abnormal weight loss Eyes: Doesn't report blurriness of vision Ears, nose, mouth, throat, and face: Doesn't report sore throat Respiratory: Doesn't report cough, dyspnea or wheezes Cardiovascular: Doesn't report palpitation, chest discomfort  Gastrointestinal:  Doesn't report nausea, constipation, diarrhea GU: Doesn't report incontinence Skin: Doesn't report skin rashes Neurological: Per HPI Musculoskeletal: Doesn't report joint pain Behavioral/Psych: Doesn't report anxiety  Physical Exam: Vitals:   09/14/21 0850  BP: 130/77  Pulse: 98  Resp: 20  Temp: 97.7 F (36.5 C)  SpO2: 100%    KPS: 70. General: Alert, cooperative, pleasant, in no acute distress Head: Normal EENT: No conjunctival injection or scleral icterus.  Lungs: Resp effort normal Cardiac: Regular rate Abdomen: Non-distended abdomen Skin: No rashes cyanosis or  petechiae. Extremities: No clubbing or edema  Neurologic Exam: Mental Status: Awake, alert, attentive to examiner. Oriented to self and environment. Language is fluent with intact comprehension.  Cranial Nerves: Visual acuity is grossly normal. Visual fields are full. Extra-ocular movements intact. No ptosis. Face is symmetric Motor: Tone and bulk are normal. Power is full in both arms and legs. Reflexes are symmetric, no pathologic reflexes present.  Dysmetria L>R Sensory: Intact to light touch Gait: Dystaxic, wide based   Labs: I have reviewed the data as listed    Component Value Date/Time  NA 134 (L) 08/24/2021 1043   NA 141 03/08/2017 1707   K 4.2 08/24/2021 1043   CL 103 08/24/2021 1043   CO2 26 08/24/2021 1043   GLUCOSE 148 (H) 08/24/2021 1043   BUN 11 08/24/2021 1043   BUN 12 03/08/2017 1707   CREATININE 0.91 08/24/2021 1043   CALCIUM 9.1 08/24/2021 1043   PROT 7.0 08/24/2021 1043   PROT 7.1 03/08/2017 1707   ALBUMIN 4.2 08/24/2021 1043   ALBUMIN 4.5 03/08/2017 1707   AST 20 08/24/2021 1043   ALT 17 08/24/2021 1043   ALKPHOS 77 08/24/2021 1043   BILITOT 0.6 08/24/2021 1043   GFRNONAA >60 08/24/2021 1043   GFRAA >60 01/06/2020 0837   Lab Results  Component Value Date   WBC 8.3 08/24/2021   NEUTROABS 6.1 08/24/2021   HGB 12.8 (L) 08/24/2021   HCT 38.6 (L) 08/24/2021   MCV 89.8 08/24/2021   PLT 207 08/24/2021    Imaging:  [place] Clinician Interpretation: I have personally reviewed the CNS images as listed.  My interpretation, in the context of the patient's clinical presentation, is stable disease  CT Chest W Contrast  Result Date: 09/10/2021 CLINICAL DATA:  Metastatic non-small cell lung cancer restaging. Prior chemotherapy, radiation therapy, and immunotherapy. * Tracking Code: BO * EXAM: CT CHEST, ABDOMEN, AND PELVIS WITH CONTRAST TECHNIQUE: Multidetector CT imaging of the chest, abdomen and pelvis was performed following the standard protocol during bolus  administration of intravenous contrast. RADIATION DOSE REDUCTION: This exam was performed according to the departmental dose-optimization program which includes automated exposure control, adjustment of the mA and/or kV according to patient size and/or use of iterative reconstruction technique. CONTRAST:  1100mL ISOVUE-300 IOPAMIDOL (ISOVUE-300) INJECTION 61% COMPARISON:  Multiple exams, including 05/25/2021 FINDINGS: CT CHEST FINDINGS Cardiovascular: Right Port-A-Cath tip: Cavoatrial junction. Left anterior descending coronary artery atherosclerosis. Stable small pericardial effusion. Mediastinum/Nodes: Small right posterior tracheal diverticulum on image 15 series 2, unchanged. No pathologic adenopathy. Lungs/Pleura: Centrilobular emphysema. Unchanged therapy related right infrahilar volume loss and consolidation in the right middle lobe and right lower lobe without contour change or other specific regional indicators of active malignancy. Airway thickening is present, suggesting bronchitis or reactive airways disease. Mildly increased conspicuity of faint and ill-defined ground-glass densities in the left upper lobe and superior segment left lower lobe along with some hazy peribronchovascular ground-glass density. A representative ground-glass density nodule in the superior segment left lower lobe measures 0.9 cm in diameter on image 83 series 6. Inflammatory etiology favored particularly in light of the airway thickening and mostly peribronchovascular distribution, surveillance suggested. Musculoskeletal: Mild bilateral gynecomastia. Chronically stable atrophy of the left teres major muscle. Hemangioma in the T3 vertebral body eccentric to the right. CT ABDOMEN PELVIS FINDINGS Hepatobiliary: Unchanged focal low-attenuation centrally in the lateral segment left hepatic lobe on image 62 series 2, negative on prior PET imaging, compatible with small benign lesion. Otherwise unremarkable. Pancreas: Unremarkable  Spleen: Unremarkable Adrenals/Urinary Tract: Tiny hypodense lesions of the left kidney are technically too small to characterize although statistically highly likely to be benign. Adrenal glands unremarkable. Urinary bladder unremarkable. Stomach/Bowel: Prominent stool throughout the colon favors constipation. Vascular/Lymphatic: Atherosclerosis is present, including aortoiliac atherosclerotic disease. 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 No pathologic adenopathy. Reproductive: Unremarkable Other: No supplemental non-categorized findings. Musculoskeletal: Unremarkable IMPRESSION: 1. Stable right infrahilar post therapy related findings in the right middle lobe and right lower lobe. 2. Airway thickening is present, suggesting bronchitis  or reactive airways disease. Faint peribronchovascular ground-glass opacities in the left upper lobe and superior segment left lower lobe along with some subtle ground-glass density nodularity which is likely inflammatory but which merits surveillance. 3. Other imaging findings of potential clinical significance: Aortic Atherosclerosis (ICD10-I70.0). 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 Emphysema (ICD10-J43.9). Chronically stable and not hypermetabolic central left lateral segment hepatic lobe hypodense lesion, compatible with benign etiology. Prominent stool throughout the colon favors constipation. Small stable pericardial effusion. Electronically Signed   By: Van Clines M.D.   On: 09/10/2021 08:58   MR BRAIN W WO CONTRAST  Result Date: 09/09/2021 CLINICAL DATA:  Brain/CNS neoplasm, assess treatment response; metastatic lung cancer follow-up EXAM: MRI HEAD WITHOUT AND WITH CONTRAST TECHNIQUE: Multiplanar, multiecho pulse sequences of the brain and surrounding  structures were obtained without and with intravenous contrast. CONTRAST:  59mL MULTIHANCE GADOBENATE DIMEGLUMINE 529 MG/ML IV SOLN Contrast was administered via a port which was accessed by a Equities trader. COMPARISON:  06/17/2021 FINDINGS: Brain: Left cerebellar peripherally enhancing lesion is similar in size measuring up to 2.9 cm. Similar surrounding edema. Similar punctate enhancement parasagittal left occipital lobe (series 11, image 71). Stable punctate enhancement in the right occipital lobe (image 84). Stable adjacent 2 small foci of enhancement at the left parieto-occipital junction (image 88). Previously described punctate foci of enhancement in the right thalamus and left posterior parietal lobe are not definitely identified. Stable 6 mm focus of enhancement of the left paracentral lobule (image 137). Stable 3 mm focus of enhancement of the right precentral gyrus (image 123). No new abnormal enhancement.  No new edema. There is no acute infarction. Ventricles and sulci are stable in size and configuration. No extra-axial collection. Other patchy foci of T2 hyperintensity in the supratentorial and pontine white matter are nonspecific but probably reflect stable chronic microvascular ischemic changes. A small right frontal developmental venous anomaly is again noted. Vascular: Major vessel flow voids at the skull base are preserved. Skull and upper cervical spine: Normal marrow signal is preserved. Sinuses/Orbits: Paranasal sinuses are aerated. Orbits are unremarkable. Other: Sella is unremarkable.  Mastoid air cells are clear. IMPRESSION: No evidence progressive or new disease. Electronically Signed   By: Macy Mis M.D.   On: 09/09/2021 15:16   CT Abdomen Pelvis W Contrast  Result Date: 09/10/2021 CLINICAL DATA:  Metastatic non-small cell lung cancer restaging. Prior chemotherapy, radiation therapy, and immunotherapy. * Tracking Code: BO * EXAM: CT CHEST, ABDOMEN, AND PELVIS WITH CONTRAST  TECHNIQUE: Multidetector CT imaging of the chest, abdomen and pelvis was performed following the standard protocol during bolus administration of intravenous contrast. RADIATION DOSE REDUCTION: This exam was performed according to the departmental dose-optimization program which includes automated exposure control, adjustment of the mA and/or kV according to patient size and/or use of iterative reconstruction technique. CONTRAST:  142mL ISOVUE-300 IOPAMIDOL (ISOVUE-300) INJECTION 61% COMPARISON:  Multiple exams, including 05/25/2021 FINDINGS: CT CHEST FINDINGS Cardiovascular: Right Port-A-Cath tip: Cavoatrial junction. Left anterior descending coronary artery atherosclerosis. Stable small pericardial effusion. Mediastinum/Nodes: Small right posterior tracheal diverticulum on image 15 series 2, unchanged. No pathologic adenopathy. Lungs/Pleura: Centrilobular emphysema. Unchanged therapy related right infrahilar volume loss and consolidation in the right middle lobe and right lower lobe without contour change or other specific regional indicators of active malignancy. Airway thickening is present, suggesting bronchitis or reactive airways disease. Mildly increased conspicuity of faint and ill-defined ground-glass densities in  the left upper lobe and superior segment left lower lobe along with some hazy peribronchovascular ground-glass density. A representative ground-glass density nodule in the superior segment left lower lobe measures 0.9 cm in diameter on image 83 series 6. Inflammatory etiology favored particularly in light of the airway thickening and mostly peribronchovascular distribution, surveillance suggested. Musculoskeletal: Mild bilateral gynecomastia. Chronically stable atrophy of the left teres major muscle. Hemangioma in the T3 vertebral body eccentric to the right. CT ABDOMEN PELVIS FINDINGS Hepatobiliary: Unchanged focal low-attenuation centrally in the lateral segment left hepatic lobe on image 62  series 2, negative on prior PET imaging, compatible with small benign lesion. Otherwise unremarkable. Pancreas: Unremarkable Spleen: Unremarkable Adrenals/Urinary Tract: Tiny hypodense lesions of the left kidney are technically too small to characterize although statistically highly likely to be benign. Adrenal glands unremarkable. Urinary bladder unremarkable. Stomach/Bowel: Prominent stool throughout the colon favors constipation. Vascular/Lymphatic: Atherosclerosis is present, including aortoiliac atherosclerotic disease. 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 No pathologic adenopathy. Reproductive: Unremarkable Other: No supplemental non-categorized findings. Musculoskeletal: Unremarkable IMPRESSION: 1. Stable right infrahilar post therapy related findings in the right middle lobe and right lower lobe. 2. Airway thickening is present, suggesting bronchitis or reactive airways disease. Faint peribronchovascular ground-glass opacities in the left upper lobe and superior segment left lower lobe along with some subtle ground-glass density nodularity which is likely inflammatory but which merits surveillance. 3. Other imaging findings of potential clinical significance: Aortic Atherosclerosis (ICD10-I70.0). 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 Emphysema (ICD10-J43.9). Chronically stable and not hypermetabolic central left lateral segment hepatic lobe hypodense lesion, compatible with benign etiology. Prominent stool throughout the colon favors constipation. Small stable pericardial effusion. Electronically Signed   By: Van Clines M.D.   On: 09/10/2021 08:58     Assessment/Plan Lung cancer metastatic to brain ([place]) [C34.90, C79.31]  ALIX STOWERS is clinically stable today.  MRI brain also  demonstrates stable findings.   He will continue on Keytruda monotherapy with Dr. Julien Nordmann.  Otherwise we ask that [name] return to clinic in 4 months following next brain MRI, or sooner as needed.  We appreciate the opportunity to participate in the care of GAIL C[place].   All questions were answered. The patient knows to call the clinic with any problems, questions or concerns. No barriers to learning were detected.  The total time spent in the encounter was 30 minutes and more than 50% was on counseling and review of test results   Ventura Sellers, MD Medical Director of Neuro-Oncology [facility] [place] 09/14/21 9:14 AM

## 2021-09-14 NOTE — Progress Notes (Signed)
[place] [phone]) [phone]   [phone]) [phone]  OFFICE PROGRESS NOTE  [name], NP [address] [name] [address] [place] [address]  DIAGNOSIS: Stage IV (T3, N0, M1C) non-small cell lung cancer, adenocarcinoma.  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 He was diagnosed in July 2021.   Molecular Biomarkers:  MSI-High DETECTED Pembrolizumab Atezolizumab, Avelumab, Cemiplimab, Dostarlimab, Durvalumab, Ipilimumab, Nivolumab   STK11Splice Site SNV 4.5% Everolimus, Temsirolimus Yes   [id number] 1.7% Binimetinib Yes   [id number] 0.4%   Niraparib, Olaparib, Rucaparib, Talazoparib, Tazemetostat Yes   PRIOR THERAPY:  1) SRS to the solitary brain metastasis under the care of Dr. Lisbeth Renshaw. Last treatment 11/14/19. 2) Weekly concurrent chemoradiation with carboplatin for an AUC of 2, paclitaxel 45 mg/m2.  First dose expected on 11/25/2019. Status post 7 cycles, last dose was giving 01/06/2020 with partial response.    CURRENT THERAPY:  1)  Immunotherapy with Keytruda 200 mg IV every 3 weeks.  First dose February 10, 2020 for a patient with MSI high.  Status post 26  cycles. 2) Avastin 15 mg/KG every 3 weeks.  First dose today for the vasogenic edema of the brain.S/P 7 cycles.  INTERVAL HISTORY: Joseph Atkins 61 y.o. male returns to the clinic today for follow-up visit accompanied by his wife.  The patient is feeling fine today with no concerning complaints except for the few skin lesions likely from the immunotherapy mediated skin rash.  He denied having any current chest pain but has shortness of breath with exertion with no cough or hemoptysis.  He has no nausea, vomiting, diarrhea or constipation.  He has no headache or visual changes.  He has no recent weight loss or night sweats.  He continues to tolerate his treatment with immunotherapy with Keytruda fairly well.   The patient is here today for evaluation with repeat CT scan of the chest, abdomen and pelvis for restaging of his disease.  MEDICAL HISTORY: Past Medical History:  Diagnosis Date   Cancer ([place])    lung cancer   Diverticulosis    GERD (gastroesophageal reflux disease)    Hx of small bowel obstruction    Hyperlipidemia    Hypertension    Hypothyroidism    IBS (irritable bowel syndrome)    Substance abuse ([place])    Alcoholic, Drug addition   Thyroid disease     ALLERGIES:  is allergic to penicillins.  MEDICATIONS:  Current Outpatient Medications  Medication Sig Dispense Refill   albuterol (PROVENTIL HFA;VENTOLIN HFA) 108 (90 Base) MCG/ACT inhaler Inhale 2 puffs into the lungs every 6 (six) hours as needed for wheezing or shortness of breath. 1 Inhaler 0   amLODipine (NORVASC) 5 MG tablet TAKE 1 TABLET (5 MG TOTAL) BY MOUTH DAILY. 90 tablet 1   ANORO ELLIPTA 62.5-25 MCG/INH AEPB 1 puff daily.     aspirin EC 81 MG tablet Take 1 tablet (81 mg total) by mouth daily. Swallow whole. 30 tablet 11   cyclobenzaprine (FLEXERIL) 10 MG tablet Take 10 mg by mouth 2 (two) times daily as needed for muscle spasms.     docusate sodium (COLACE) 100 MG capsule Take 100 mg by mouth daily.     DULoxetine (CYMBALTA) 20 MG capsule Take 1 capsule (20 mg total) by mouth 2 (two) times daily. 60 capsule 5   fluticasone furoate-vilanterol (BREO ELLIPTA) 100-25 MCG/INH AEPB Inhale 1 puff into  the lungs daily. 30 each 3   levothyroxine (SYNTHROID) 100 MCG tablet TAKE 1 TABLET BY MOUTH EVERY DAY 90 tablet 1   lidocaine-prilocaine (EMLA) cream Apply 1 application topically as needed. 30 g 0   omeprazole (PRILOSEC) 40 MG capsule Take 1 capsule (40 mg total) by mouth daily. 90 capsule 4   ondansetron (ZOFRAN) 4 MG tablet Take 1 tablet (4 mg total) by mouth every 8 (eight) hours as needed. 40 tablet 2   oxyCODONE-acetaminophen (PERCOCET/ROXICET) 5-325 MG tablet Take 1 tablet by mouth every 4 (four) hours as needed for  severe pain. 30 tablet 0   pembrolizumab (KEYTRUDA) 100 MG/4ML SOLN See admin instructions.     polyethylene glycol powder (MIRALAX) powder Take 17 g by mouth daily. 255 g 11   prochlorperazine (COMPAZINE) 10 MG tablet Take 1 tablet (10 mg total) by mouth every 6 (six) hours as needed. 30 tablet 2   triamcinolone cream (KENALOG) 0.1 % Apply topically 2 (two) times daily as needed. 453.6 g 0   TRULANCE 3 MG TABS Take 1 tablet by mouth daily.     zolpidem (AMBIEN CR) 12.5 MG CR tablet Take 1 tablet (12.5 mg total) by mouth at bedtime as needed for sleep. 30 tablet 5   No current facility-administered medications for this visit.    SURGICAL HISTORY:  Past Surgical History:  Procedure Laterality Date   arm surgery Right    BRONCHIAL BRUSHINGS  10/24/2019   Procedure: BRONCHIAL BRUSHINGS;  Surgeon: Collene Gobble, MD;  Location: [facility] ENDOSCOPY;  Service: Cardiopulmonary;;  right lower lobe    BRONCHIAL BRUSHINGS  11/05/2019   Procedure: BRONCHIAL BRUSHINGS;  Surgeon: Collene Gobble, MD;  Location: [facility] ENDOSCOPY;  Service: Pulmonary;;   BRONCHIAL NEEDLE ASPIRATION BIOPSY  10/24/2019   Procedure: BRONCHIAL NEEDLE ASPIRATION BIOPSIES;  Surgeon: Collene Gobble, MD;  Location: [place];  Service: Cardiopulmonary;;   BRONCHIAL NEEDLE ASPIRATION BIOPSY  11/05/2019   Procedure: BRONCHIAL NEEDLE ASPIRATION BIOPSIES;  Surgeon: Collene Gobble, MD;  Location: [facility] ENDOSCOPY;  Service: Pulmonary;;   ENDOBRONCHIAL ULTRASOUND N/A 10/24/2019   Procedure: ENDOBRONCHIAL ULTRASOUND;  Surgeon: Collene Gobble, MD;  Location: [place];  Service: Cardiopulmonary;  Laterality: N/A;   FINGER SURGERY Right    Middle   IR IMAGING GUIDED PORT INSERTION  11/19/2019   VIDEO BRONCHOSCOPY N/A 10/24/2019   Procedure: VIDEO BRONCHOSCOPY WITHOUT FLUORO;  Surgeon: Collene Gobble, MD;  Location: [place];  Service: Cardiopulmonary;  Laterality: N/A;   VIDEO BRONCHOSCOPY WITH ENDOBRONCHIAL NAVIGATION N/A 11/05/2019    Procedure: VIDEO BRONCHOSCOPY WITH ENDOBRONCHIAL NAVIGATION;  Surgeon: Collene Gobble, MD;  Location: [place] ENDOSCOPY;  Service: Pulmonary;  Laterality: N/A;    REVIEW OF SYSTEMS:  Constitutional: positive for fatigue Eyes: negative Ears, nose, mouth, throat, and face: negative Respiratory: positive for dyspnea on exertion Cardiovascular: negative Gastrointestinal: negative Genitourinary:negative Integument/breast: negative Hematologic/lymphatic: negative Musculoskeletal:negative Neurological: negative Behavioral/Psych: negative Endocrine: negative Allergic/Immunologic: negative   PHYSICAL EXAMINATION: General appearance: alert, cooperative, fatigued, and no distress Head: Normocephalic, without obvious abnormality, atraumatic Neck: no adenopathy, no JVD, supple, symmetrical, trachea midline, and thyroid not enlarged, symmetric, no tenderness/mass/nodules Lymph nodes: Cervical, supraclavicular, and axillary nodes normal. Resp: clear to auscultation bilaterally Back: symmetric, no curvature. ROM normal. No CVA tenderness. Cardio: regular rate and rhythm, S1, S2 normal, no murmur, click, rub or gallop GI: soft, non-tender; bowel sounds normal; no masses,  no organomegaly Extremities: extremities normal, atraumatic, no cyanosis or edema Neurologic: Alert and oriented X 3, normal  strength and tone. Normal symmetric reflexes. Normal coordination and gait  ECOG PERFORMANCE STATUS: 1 - Symptomatic but completely ambulatory  Blood pressure 127/86, pulse 98, temperature 97.6 F (36.4 C), temperature source Tympanic, resp. rate 16, weight 183 lb 14.4 oz (83.4 kg), SpO2 100 %.  LABORATORY DATA: Lab Results  Component Value Date   WBC 9.9 09/14/2021   HGB 13.7 09/14/2021   HCT 40.2 09/14/2021   MCV 89.1 09/14/2021   PLT 228 09/14/2021      Chemistry      Component Value Date/Time   NA 134 (L) 09/14/2021 1021   NA 141 03/08/2017 1707   K 4.3 09/14/2021 1021   CL 102 09/14/2021 1021    CO2 27 09/14/2021 1021   BUN 12 09/14/2021 1021   BUN 12 03/08/2017 1707   CREATININE 0.88 09/14/2021 1021      Component Value Date/Time   CALCIUM 9.4 09/14/2021 1021   ALKPHOS 82 09/14/2021 1021   AST 24 09/14/2021 1021   ALT 21 09/14/2021 1021   BILITOT 0.6 09/14/2021 1021       RADIOGRAPHIC STUDIES: CT Chest W Contrast  Result Date: 09/10/2021 CLINICAL DATA:  Metastatic non-small cell lung cancer restaging. Prior chemotherapy, radiation therapy, and immunotherapy. * Tracking Code: BO * EXAM: CT CHEST, ABDOMEN, AND PELVIS WITH CONTRAST TECHNIQUE: Multidetector CT imaging of the chest, abdomen and pelvis was performed following the standard protocol during bolus administration of intravenous contrast. RADIATION DOSE REDUCTION: This exam was performed according to the departmental dose-optimization program which includes automated exposure control, adjustment of the mA and/or kV according to patient size and/or use of iterative reconstruction technique. CONTRAST:  136mL ISOVUE-300 IOPAMIDOL (ISOVUE-300) INJECTION 61% COMPARISON:  Multiple exams, including 05/25/2021 FINDINGS: CT CHEST FINDINGS Cardiovascular: Right Port-A-Cath tip: Cavoatrial junction. Left anterior descending coronary artery atherosclerosis. Stable small pericardial effusion. Mediastinum/Nodes: Small right posterior tracheal diverticulum on image 15 series 2, unchanged. No pathologic adenopathy. Lungs/Pleura: Centrilobular emphysema. Unchanged therapy related right infrahilar volume loss and consolidation in the right middle lobe and right lower lobe without contour change or other specific regional indicators of active malignancy. Airway thickening is present, suggesting bronchitis or reactive airways disease. Mildly increased conspicuity of faint and ill-defined ground-glass densities in the left upper lobe and superior segment left lower lobe along with some hazy peribronchovascular ground-glass density. A representative  ground-glass density nodule in the superior segment left lower lobe measures 0.9 cm in diameter on image 83 series 6. Inflammatory etiology favored particularly in light of the airway thickening and mostly peribronchovascular distribution, surveillance suggested. Musculoskeletal: Mild bilateral gynecomastia. Chronically stable atrophy of the left teres major muscle. Hemangioma in the T3 vertebral body eccentric to the right. CT ABDOMEN PELVIS FINDINGS Hepatobiliary: Unchanged focal low-attenuation centrally in the lateral segment left hepatic lobe on image 62 series 2, negative on prior PET imaging, compatible with small benign lesion. Otherwise unremarkable. Pancreas: Unremarkable Spleen: Unremarkable Adrenals/Urinary Tract: Tiny hypodense lesions of the left kidney are technically too small to characterize although statistically highly likely to be benign. Adrenal glands unremarkable. Urinary bladder unremarkable. Stomach/Bowel: Prominent stool throughout the colon favors constipation. Vascular/Lymphatic: Atherosclerosis is present, including aortoiliac atherosclerotic disease. 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 No pathologic adenopathy. Reproductive: Unremarkable Other: No supplemental non-categorized findings. Musculoskeletal: Unremarkable IMPRESSION: 1. Stable right infrahilar post therapy related findings in the right middle lobe and right lower lobe. 2. Airway thickening is present, suggesting bronchitis or reactive  airways disease. Faint peribronchovascular ground-glass opacities in the left upper lobe and superior segment left lower lobe along with some subtle ground-glass density nodularity which is likely inflammatory but which merits surveillance. 3. Other imaging findings of potential clinical significance: Aortic Atherosclerosis (ICD10-I70.0). Coronary and systemic atherosclerosis with chronic occlusion of the left iliac  arteries with external iliac reconstitution from the left inferior epigastric artery. Emphysema (ICD10-J43.9). Chronically stable and not hypermetabolic central left lateral segment hepatic lobe hypodense lesion, compatible with benign etiology. Prominent stool throughout the colon favors constipation. Small stable pericardial effusion. Electronically Signed   By: Van Clines M.D.   On: 09/10/2021 08:58   MR BRAIN W WO CONTRAST  Result Date: 09/09/2021 CLINICAL DATA:  Brain/CNS neoplasm, assess treatment response; metastatic lung cancer follow-up EXAM: MRI HEAD WITHOUT AND WITH CONTRAST TECHNIQUE: Multiplanar, multiecho pulse sequences of the brain and surrounding structures were obtained without and with intravenous contrast. CONTRAST:  14mL MULTIHANCE GADOBENATE DIMEGLUMINE 529 MG/ML IV SOLN Contrast was administered via a port which was accessed by a Equities trader. COMPARISON:  06/17/2021 FINDINGS: Brain: Left cerebellar peripherally enhancing lesion is similar in size measuring up to 2.9 cm. Similar surrounding edema. Similar punctate enhancement parasagittal left occipital lobe (series 11, image 71). Stable punctate enhancement in the right occipital lobe (image 84). Stable adjacent 2 small foci of enhancement at the left parieto-occipital junction (image 88). Previously described punctate foci of enhancement in the right thalamus and left posterior parietal lobe are not definitely identified. Stable 6 mm focus of enhancement of the left paracentral lobule (image 137). Stable 3 mm focus of enhancement of the right precentral gyrus (image 123). No new abnormal enhancement.  No new edema. There is no acute infarction. Ventricles and sulci are stable in size and configuration. No extra-axial collection. Other patchy foci of T2 hyperintensity in the supratentorial and pontine white matter are nonspecific but probably reflect stable chronic microvascular ischemic changes. A small right frontal  developmental venous anomaly is again noted. Vascular: Major vessel flow voids at the skull base are preserved. Skull and upper cervical spine: Normal marrow signal is preserved. Sinuses/Orbits: Paranasal sinuses are aerated. Orbits are unremarkable. Other: Sella is unremarkable.  Mastoid air cells are clear. IMPRESSION: No evidence progressive or new disease. Electronically Signed   By: Macy Mis M.D.   On: 09/09/2021 15:16   CT Abdomen Pelvis W Contrast  Result Date: 09/10/2021 CLINICAL DATA:  Metastatic non-small cell lung cancer restaging. Prior chemotherapy, radiation therapy, and immunotherapy. * Tracking Code: BO * EXAM: CT CHEST, ABDOMEN, AND PELVIS WITH CONTRAST TECHNIQUE: Multidetector CT imaging of the chest, abdomen and pelvis was performed following the standard protocol during bolus administration of intravenous contrast. RADIATION DOSE REDUCTION: This exam was performed according to the departmental dose-optimization program which includes automated exposure control, adjustment of the mA and/or kV according to patient size and/or use of iterative reconstruction technique. CONTRAST:  182mL ISOVUE-300 IOPAMIDOL (ISOVUE-300) INJECTION 61% COMPARISON:  Multiple exams, including 05/25/2021 FINDINGS: CT CHEST FINDINGS Cardiovascular: Right Port-A-Cath tip: Cavoatrial junction. Left anterior descending coronary artery atherosclerosis. Stable small pericardial effusion. Mediastinum/Nodes: Small right posterior tracheal diverticulum on image 15 series 2, unchanged. No pathologic adenopathy. Lungs/Pleura: Centrilobular emphysema. Unchanged therapy related right infrahilar volume loss and consolidation in the right middle lobe and right lower lobe without contour change or other specific regional indicators of active malignancy. Airway thickening is present, suggesting bronchitis or reactive airways disease. Mildly increased conspicuity of faint and ill-defined ground-glass densities in the left  upper  lobe and superior segment left lower lobe along with some hazy peribronchovascular ground-glass density. A representative ground-glass density nodule in the superior segment left lower lobe measures 0.9 cm in diameter on image 83 series 6. Inflammatory etiology favored particularly in light of the airway thickening and mostly peribronchovascular distribution, surveillance suggested. Musculoskeletal: Mild bilateral gynecomastia. Chronically stable atrophy of the left teres major muscle. Hemangioma in the T3 vertebral body eccentric to the right. CT ABDOMEN PELVIS FINDINGS Hepatobiliary: Unchanged focal low-attenuation centrally in the lateral segment left hepatic lobe on image 62 series 2, negative on prior PET imaging, compatible with small benign lesion. Otherwise unremarkable. Pancreas: Unremarkable Spleen: Unremarkable Adrenals/Urinary Tract: Tiny hypodense lesions of the left kidney are technically too small to characterize although statistically highly likely to be benign. Adrenal glands unremarkable. Urinary bladder unremarkable. Stomach/Bowel: Prominent stool throughout the colon favors constipation. Vascular/Lymphatic: Atherosclerosis is present, including aortoiliac atherosclerotic disease. 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 No pathologic adenopathy. Reproductive: Unremarkable Other: No supplemental non-categorized findings. Musculoskeletal: Unremarkable IMPRESSION: 1. Stable right infrahilar post therapy related findings in the right middle lobe and right lower lobe. 2. Airway thickening is present, suggesting bronchitis or reactive airways disease. Faint peribronchovascular ground-glass opacities in the left upper lobe and superior segment left lower lobe along with some subtle ground-glass density nodularity which is likely inflammatory but which merits surveillance. 3. Other imaging findings of potential clinical  significance: Aortic Atherosclerosis (ICD10-I70.0). 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 Emphysema (ICD10-J43.9). Chronically stable and not hypermetabolic central left lateral segment hepatic lobe hypodense lesion, compatible with benign etiology. Prominent stool throughout the colon favors constipation. Small stable pericardial effusion. Electronically Signed   By: Van Clines M.D.   On: 09/10/2021 08:58     ASSESSMENT AND PLAN: This is a very pleasant 61 years old white male with stage IV (T3, N0, M1c) non-small cell lung cancer, adenocarcinoma with MSI high presented with right lower lobe/infrahilar mass in addition to solitary brain metastasis in the left cerebellum diagnosed in July 2021. He is status post SRS to the solitary brain metastasis. The patient completed a course of concurrent chemoradiation with weekly carboplatin and paclitaxel.  He tolerated the treatment well except for fatigue and mild odynophagia. The patient has MSI high and I recommended for him treatment with immunotherapy with single agent Keytruda 200 mg IV every 3 weeks for a total of 2 years unless the patient has unacceptable toxicity or disease progression. He is status post 26  cycles of treatment with Keytruda.  He also received 7 cycles of Avastin for the vasogenic edema in the brain.   Avastin will be on hold for now unless needed in the future. The patient has been tolerating this treatment with immunotherapy fairly well with no concerning adverse effect except for mild skin rash. He had repeat CT scan of the chest, abdomen pelvis performed recently.  I personally and independently reviewed the scan and discussed the result with the patient and his wife. His scan showed no concerning findings for disease progression. I recommended for the patient to continue his current treatment with  [facility] and he will proceed with cycle #27 today. For the skin lesion, I will give him refill of Kenalog 0.1% to apply to the affected area. The patient was advised to call immediately if he has any other concerning  symptoms in the interval. The patient voices understanding of current disease status and treatment options and is in agreement with the current care plan.  All questions were answered. The patient knows to call the clinic with any problems, questions or concerns. We can certainly see the patient much sooner if necessary.  Disclaimer: This note was dictated with voice recognition software. Similar sounding words can inadvertently be transcribed and may not be corrected upon review.

## 2021-09-15 ENCOUNTER — Telehealth: Payer: Self-pay | Admitting: Internal Medicine

## 2021-09-15 NOTE — Telephone Encounter (Signed)
Per 5/23 los called pt and left message with appointment details.  Call back number left

## 2021-09-27 ENCOUNTER — Telehealth: Payer: Self-pay | Admitting: Physician Assistant

## 2021-09-27 NOTE — Progress Notes (Signed)
Joseph Atkins OFFICE PROGRESS NOTE  Joseph Sill, NP [address] [name] [address] [place]  [id number]  DIAGNOSIS: Stage IV carcinoma, non-small cell lung cancer, adenocarcinoma.  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 He was diagnosed in July 2021.   Molecular Biomarkers:  MSI-High DETECTED Pembrolizumab Atezolizumab, Avelumab, Cemiplimab, Dostarlimab, Durvalumab, Ipilimumab, Nivolumab   STK11Splice Site SNV 9.7% Everolimus, Temsirolimus Yes   KRASG12D 1.7% Binimetinib Yes   [id number] 0.4%   Niraparib, Olaparib, Rucaparib, Talazoparib, Tazemetostat Yes    PRIOR THERAPY: 1) SRS to the solitary brain metastasis under the care of Dr. Lisbeth Renshaw. Last treatment 7/22/212)  2) Weekly concurrent chemoradiation with carboplatin for an AUC of 2, paclitaxel 45 mg/m2.  First dose expected on 11/25/2019. Status post 7 cycles, last dose was giving 01/06/2020 with partial response.   CURRENT THERAPY: 1)  Immunotherapy with Keytruda 200 mg IV every 3 weeks.  First dose February 10, 2020 for a patient with MSI high.  Status post 27 cycles. 2) Avastin 15 mg/KG every 3 weeks.  For the vasogenic edema of the brain.S/P 7 cycles.  INTERVAL HISTORY: Joseph Atkins 61 y.o. male returns  to the clinic today for a follow-up visit accompanied by his wife.  The patient is tolerating single agent immunotherapy with Keytruda well without any new concerning adverse side effects except he has some rash/itching; however, this has improved with kenalog cream substantially. He was referred to dermatology but he was not able to get an appointment until August.  Today, the patient states everything is "the same". The patient denies any fever, chills, night sweats, or weight loss.  He reports stable dyspnea on exertion.  Denies significant cough.  He denies any or hemoptysis or chest discomfort.  He denies any  nausea, vomiting, constipation ,or diarrhea. He follows closely with neuro oncology for his history of metastatic disease to the brain. He is expected to have a repeat brain MRI in September 2023. He denies recent headaches or visual changes. The patient is here today for evaluation before starting cycle #28.      MEDICAL HISTORY: Past Medical History:  Diagnosis Date   Cancer ([place])    lung cancer   Diverticulosis    GERD (gastroesophageal reflux disease)    Hx of small bowel obstruction    Hyperlipidemia    Hypertension    Hypothyroidism    IBS (irritable bowel syndrome)    Substance abuse ([place])    Alcoholic, Drug addition   Thyroid disease     ALLERGIES:  is allergic to penicillins.  MEDICATIONS:  Current Outpatient Medications  Medication Sig Dispense Refill   albuterol (PROVENTIL HFA;VENTOLIN HFA) 108 (90 Base) MCG/ACT inhaler Inhale 2 puffs into the lungs every 6 (six) hours as needed for wheezing or shortness of breath. 1 Inhaler 0   amLODipine (NORVASC) 5 MG tablet TAKE 1 TABLET (5 MG TOTAL) BY MOUTH DAILY. 90 tablet 1   ANORO ELLIPTA 62.5-25 MCG/INH AEPB 1 puff daily.     aspirin EC 81 MG tablet Take 1 tablet (81 mg total) by mouth daily. Swallow whole. 30 tablet 11   cyclobenzaprine (FLEXERIL) 10 MG tablet Take 10 mg by mouth 2 (two) times daily as needed for muscle spasms.     docusate sodium (COLACE) 100 MG capsule Take 100 mg by mouth daily.     DULoxetine (CYMBALTA) 20 MG capsule Take 1 capsule (20 mg total)  by mouth 2 (two) times daily. 60 capsule 5   fluticasone furoate-vilanterol (BREO ELLIPTA) 100-25 MCG/INH AEPB Inhale 1 puff into the lungs daily. 30 each 3   levothyroxine (SYNTHROID) 100 MCG tablet TAKE 1 TABLET BY MOUTH EVERY DAY 90 tablet 1   lidocaine-prilocaine (EMLA) cream Apply 1 application topically as needed. 30 g 0   omeprazole (PRILOSEC) 40 MG capsule Take 1 capsule (40 mg total) by mouth daily. 90 capsule 4   ondansetron (ZOFRAN) 4 MG tablet Take 1  tablet (4 mg total) by mouth every 8 (eight) hours as needed. 40 tablet 2   oxyCODONE-acetaminophen (PERCOCET/ROXICET) 5-325 MG tablet Take 1 tablet by mouth every 4 (four) hours as needed for severe pain. 30 tablet 0   pembrolizumab (KEYTRUDA) 100 MG/4ML SOLN See admin instructions.     polyethylene glycol powder (MIRALAX) powder Take 17 g by mouth daily. 255 g 11   prochlorperazine (COMPAZINE) 10 MG tablet Take 1 tablet (10 mg total) by mouth every 6 (six) hours as needed. 30 tablet 2   triamcinolone cream (KENALOG) 0.1 % APPLY TOPICALLY 2 TIMES DAILY AS NEEDED. 454 g 0   TRULANCE 3 MG TABS Take 1 tablet by mouth daily.     zolpidem (AMBIEN CR) 12.5 MG CR tablet Take 1 tablet (12.5 mg total) by mouth at bedtime as needed for sleep. 30 tablet 5   No current facility-administered medications for this visit.    SURGICAL HISTORY:  Past Surgical History:  Procedure Laterality Date   arm surgery Right    BRONCHIAL BRUSHINGS  10/24/2019   Procedure: BRONCHIAL BRUSHINGS;  Surgeon: Collene Gobble, MD;  Location[facility] ENDOSCOPY;  Service: Cardiopulmonary;;  right lower lobe    BRONCHIAL BRUSHINGS  11/05/2019   Procedure: BRONCHIAL BRUSHINGS;  Surgeon: Collene Gobble, MD;  Location: [facility] ENDOSCOPY;  Service: Pulmonary;;   BRONCHIAL NEEDLE ASPIRATION BIOPSY  10/24/2019   Procedure: BRONCHIAL NEEDLE ASPIRATION BIOPSIES;  Surgeon: Collene Gobble, MD;  Location: [place];  Service: Cardiopulmonary;;   BRONCHIAL NEEDLE ASPIRATION BIOPSY  11/05/2019   Procedure: BRONCHIAL NEEDLE ASPIRATION BIOPSIES;  Surgeon: Collene Gobble, MD;  Location: [facility] ENDOSCOPY;  Service: Pulmonary;;   ENDOBRONCHIAL ULTRASOUND N/A 10/24/2019   Procedure: ENDOBRONCHIAL ULTRASOUND;  Surgeon: Collene Gobble, MD;  Location: [place];  Service: Cardiopulmonary;  Laterality: N/A;   FINGER SURGERY Right    Middle   IR IMAGING GUIDED PORT INSERTION  11/19/2019   VIDEO BRONCHOSCOPY N/A 10/24/2019   Procedure: VIDEO BRONCHOSCOPY WITHOUT  FLUORO;  Surgeon: Collene Gobble, MD;  Location: [place];  Service: Cardiopulmonary;  Laterality: N/A;   VIDEO BRONCHOSCOPY WITH ENDOBRONCHIAL NAVIGATION N/A 11/05/2019   Procedure: VIDEO BRONCHOSCOPY WITH ENDOBRONCHIAL NAVIGATION;  Surgeon: Collene Gobble, MD;  Location: [facility] ENDOSCOPY;  Service: Pulmonary;  Laterality: N/A;    REVIEW OF SYSTEMS:   Constitutional: Positive for fatigue. Negative for appetite change, chills,  fever and unexpected weight change.  HENT: Negative for mouth sores, nosebleeds, sore throat and trouble swallowing.   Eyes: Negative for eye problems and icterus.  Respiratory: Positive for baseline dyspnea on exertion.  Negative for cough, hemoptysis, and wheezing.   Cardiovascular: Negative for chest pain and leg swelling.  Gastrointestinal: Negative for abdominal pain, constipation, diarrhea, nausea and vomiting.  Genitourinary: Negative for bladder incontinence, difficulty urinating, dysuria, frequency and hematuria.   Musculoskeletal: Negative for back pain, gait problem, neck pain and neck stiffness.  Skin: Positive for dry scattered skin lesions/plaques (improving/stable) Neurological: Negative for dizziness,  extremity weakness, gait problem, headaches, light-headedness and seizures.  Hematological: Negative for adenopathy. Does not bruise/bleed easily.  Psychiatric/Behavioral: Negative for confusion, depression and sleep disturbance. The patient is not nervous/anxious.    PHYSICAL EXAMINATION:  Blood pressure 134/87, pulse 91, temperature 98.5 F (36.9 C), temperature source Tympanic, resp. rate 18, height $RemoveBe'5\' 8"'kFhbKskZY$  (1.727 m), weight 183 lb 14.4 oz (83.4 kg), SpO2 100 %.  ECOG PERFORMANCE STATUS: 1  Physical Exam  Constitutional: Oriented to person, place, and time and well-developed, well-nourished, and in no distress.  HENT:  Head: Normocephalic and atraumatic.  Mouth/Throat: Oropharynx is clear and moist. No oropharyngeal exudate.  Eyes: Conjunctivae  are normal. Right eye exhibits no discharge. Left eye exhibits no discharge. No scleral icterus.  Neck: Normal range of motion. Neck supple.  Cardiovascular: Normal rate, regular rhythm, normal heart sounds and intact distal pulses.   Pulmonary/Chest: Effort normal and breath sounds normal. No respiratory distress. No wheezes. No rales.  Abdominal: Soft. Bowel sounds are normal. Exhibits no distension and no mass. There is no tenderness.  Musculoskeletal: Normal range of motion. Exhibits no edema.  Lymphadenopathy:    No cervical adenopathy.  Neurological: Alert and oriented to person, place, and time. Exhibits normal muscle tone. Gait normal. Coordination normal.  Skin: Positive for immunotherapy mediated skin rash/plaques.  Skin is warm and dry. Not diaphoretic. No erythema. No pallor.  Psychiatric: Mood, memory and judgment normal.  Vitals reviewed.  LABORATORY DATA: Lab Results  Component Value Date   WBC 6.8 10/05/2021   HGB 13.5 10/05/2021   HCT 40.8 10/05/2021   MCV 90.3 10/05/2021   PLT 219 10/05/2021      Chemistry      Component Value Date/Time   NA 135 10/05/2021 0925   NA 141 03/08/2017 1707   K 4.3 10/05/2021 0925   CL 104 10/05/2021 0925   CO2 26 10/05/2021 0925   BUN 13 10/05/2021 0925   BUN 12 03/08/2017 1707   CREATININE 0.94 10/05/2021 0925      Component Value Date/Time   CALCIUM 9.4 10/05/2021 0925   ALKPHOS 68 10/05/2021 0925   AST 20 10/05/2021 0925   ALT 18 10/05/2021 0925   BILITOT 0.6 10/05/2021 0925       RADIOGRAPHIC STUDIES:  CT Chest W Contrast  Result Date: 09/10/2021 CLINICAL DATA:  Metastatic non-small cell lung cancer restaging. Prior chemotherapy, radiation therapy, and immunotherapy. * Tracking Code: BO * EXAM: CT CHEST, ABDOMEN, AND PELVIS WITH CONTRAST TECHNIQUE: Multidetector CT imaging of the chest, abdomen and pelvis was performed following the standard protocol during bolus administration of intravenous contrast. RADIATION DOSE  REDUCTION: This exam was performed according to the departmental dose-optimization program which includes automated exposure control, adjustment of the mA and/or kV according to patient size and/or use of iterative reconstruction technique. CONTRAST:  142mL ISOVUE-300 IOPAMIDOL (ISOVUE-300) INJECTION 61% COMPARISON:  Multiple exams, including 05/25/2021 FINDINGS: CT CHEST FINDINGS Cardiovascular: Right Port-A-Cath tip: Cavoatrial junction. Left anterior descending coronary artery atherosclerosis. Stable small pericardial effusion. Mediastinum/Nodes: Small right posterior tracheal diverticulum on image 15 series 2, unchanged. No pathologic adenopathy. Lungs/Pleura: Centrilobular emphysema. Unchanged therapy related right infrahilar volume loss and consolidation in the right middle lobe and right lower lobe without contour change or other specific regional indicators of active malignancy. Airway thickening is present, suggesting bronchitis or reactive airways disease. Mildly increased conspicuity of faint and ill-defined ground-glass densities in the left upper lobe and superior segment left lower lobe along with some hazy peribronchovascular ground-glass density.  A representative ground-glass density nodule in the superior segment left lower lobe measures 0.9 cm in diameter on image 83 series 6. Inflammatory etiology favored particularly in light of the airway thickening and mostly peribronchovascular distribution, surveillance suggested. Musculoskeletal: Mild bilateral gynecomastia. Chronically stable atrophy of the left teres major muscle. Hemangioma in the T3 vertebral body eccentric to the right. CT ABDOMEN PELVIS FINDINGS Hepatobiliary: Unchanged focal low-attenuation centrally in the lateral segment left hepatic lobe on image 62 series 2, negative on prior PET imaging, compatible with small benign lesion. Otherwise unremarkable. Pancreas: Unremarkable Spleen: Unremarkable Adrenals/Urinary Tract: Tiny hypodense  lesions of the left kidney are technically too small to characterize although statistically highly likely to be benign. Adrenal glands unremarkable. Urinary bladder unremarkable. Stomach/Bowel: Prominent stool throughout the colon favors constipation. Vascular/Lymphatic: Atherosclerosis is present, including aortoiliac atherosclerotic disease. 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 No pathologic adenopathy. Reproductive: Unremarkable Other: No supplemental non-categorized findings. Musculoskeletal: Unremarkable IMPRESSION: 1. Stable right infrahilar post therapy related findings in the right middle lobe and right lower lobe. 2. Airway thickening is present, suggesting bronchitis or reactive airways disease. Faint peribronchovascular ground-glass opacities in the left upper lobe and superior segment left lower lobe along with some subtle ground-glass density nodularity which is likely inflammatory but which merits surveillance. 3. Other imaging findings of potential clinical significance: Aortic Atherosclerosis (ICD10-I70.0). 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 Emphysema (ICD10-J43.9). Chronically stable and not hypermetabolic central left lateral segment hepatic lobe hypodense lesion, compatible with benign etiology. Prominent stool throughout the colon favors constipation. Small stable pericardial effusion. Electronically Signed   By: Van Clines M.D.   On: 09/10/2021 08:58   CT Abdomen Pelvis W Contrast  Result Date: 09/10/2021 CLINICAL DATA:  Metastatic non-small cell lung cancer restaging. Prior chemotherapy, radiation therapy, and immunotherapy. * Tracking Code: BO * EXAM: CT CHEST, ABDOMEN, AND PELVIS WITH CONTRAST TECHNIQUE: Multidetector CT imaging of the chest, abdomen and pelvis was performed  following the standard protocol during bolus administration of intravenous contrast. RADIATION DOSE REDUCTION: This exam was performed according to the departmental dose-optimization program which includes automated exposure control, adjustment of the mA and/or kV according to patient size and/or use of iterative reconstruction technique. CONTRAST:  130mL ISOVUE-300 IOPAMIDOL (ISOVUE-300) INJECTION 61% COMPARISON:  Multiple exams, including 05/25/2021 FINDINGS: CT CHEST FINDINGS Cardiovascular: Right Port-A-Cath tip: Cavoatrial junction. Left anterior descending coronary artery atherosclerosis. Stable small pericardial effusion. Mediastinum/Nodes: Small right posterior tracheal diverticulum on image 15 series 2, unchanged. No pathologic adenopathy. Lungs/Pleura: Centrilobular emphysema. Unchanged therapy related right infrahilar volume loss and consolidation in the right middle lobe and right lower lobe without contour change or other specific regional indicators of active malignancy. Airway thickening is present, suggesting bronchitis or reactive airways disease. Mildly increased conspicuity of faint and ill-defined ground-glass densities in the left upper lobe and superior segment left lower lobe along with some hazy peribronchovascular ground-glass density. A representative ground-glass density nodule in the superior segment left lower lobe measures 0.9 cm in diameter on image 83 series 6. Inflammatory etiology favored particularly in light of the airway thickening and mostly peribronchovascular distribution, surveillance suggested. Musculoskeletal: Mild bilateral gynecomastia. Chronically stable atrophy of the left teres major muscle. Hemangioma in the T3 vertebral body eccentric to the right. CT ABDOMEN PELVIS FINDINGS Hepatobiliary: Unchanged focal low-attenuation centrally in the lateral segment left hepatic lobe on  image 62 series 2, negative on prior PET imaging, compatible with small benign lesion.  Otherwise unremarkable. Pancreas: Unremarkable Spleen: Unremarkable Adrenals/Urinary Tract: Tiny hypodense lesions of the left kidney are technically too small to characterize although statistically highly likely to be benign. Adrenal glands unremarkable. Urinary bladder unremarkable. Stomach/Bowel: Prominent stool throughout the colon favors constipation. Vascular/Lymphatic: Atherosclerosis is present, including aortoiliac atherosclerotic disease. 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 No pathologic adenopathy. Reproductive: Unremarkable Other: No supplemental non-categorized findings. Musculoskeletal: Unremarkable IMPRESSION: 1. Stable right infrahilar post therapy related findings in the right middle lobe and right lower lobe. 2. Airway thickening is present, suggesting bronchitis or reactive airways disease. Faint peribronchovascular ground-glass opacities in the left upper lobe and superior segment left lower lobe along with some subtle ground-glass density nodularity which is likely inflammatory but which merits surveillance. 3. Other imaging findings of potential clinical significance: Aortic Atherosclerosis (ICD10-I70.0). 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 Emphysema (ICD10-J43.9). Chronically stable and not hypermetabolic central left lateral segment hepatic lobe hypodense lesion, compatible with benign etiology. Prominent stool throughout the colon favors constipation. Small stable pericardial effusion. Electronically Signed   By: Van Clines M.D.   On: 09/10/2021 08:58   MR BRAIN W WO CONTRAST  Result Date: 09/09/2021 CLINICAL DATA:  Brain/CNS neoplasm, assess treatment response; metastatic lung cancer follow-up EXAM: MRI HEAD WITHOUT AND WITH CONTRAST TECHNIQUE: Multiplanar, multiecho pulse  sequences of the brain and surrounding structures were obtained without and with intravenous contrast. CONTRAST:  40mL MULTIHANCE GADOBENATE DIMEGLUMINE 529 MG/ML IV SOLN Contrast was administered via a port which was accessed by a Equities trader. COMPARISON:  06/17/2021 FINDINGS: Brain: Left cerebellar peripherally enhancing lesion is similar in size measuring up to 2.9 cm. Similar surrounding edema. Similar punctate enhancement parasagittal left occipital lobe (series 11, image 71). Stable punctate enhancement in the right occipital lobe (image 84). Stable adjacent 2 small foci of enhancement at the left parieto-occipital junction (image 88). Previously described punctate foci of enhancement in the right thalamus and left posterior parietal lobe are not definitely identified. Stable 6 mm focus of enhancement of the left paracentral lobule (image 137). Stable 3 mm focus of enhancement of the right precentral gyrus (image 123). No new abnormal enhancement.  No new edema. There is no acute infarction. Ventricles and sulci are stable in size and configuration. No extra-axial collection. Other patchy foci of T2 hyperintensity in the supratentorial and pontine white matter are nonspecific but probably reflect stable chronic microvascular ischemic changes. A small right frontal developmental venous anomaly is again noted. Vascular: Major vessel flow voids at the skull base are preserved. Skull and upper cervical spine: Normal marrow signal is preserved. Sinuses/Orbits: Paranasal sinuses are aerated. Orbits are unremarkable. Other: Sella is unremarkable.  Mastoid air cells are clear. IMPRESSION: No evidence progressive or new disease. Electronically Signed   By: Macy Mis M.D.   On: 09/09/2021 15:16     ASSESSMENT/PLAN:  This is a very pleasant 61 year old Caucasian male diagnosed with stage IV non-small cell lung cancer, adenocarcinoma.  The patient presented with a right lower lobe/infrahilar mass as well as  a solitary brain metastasis in the left cerebellum. He was diagnosed in July 2021. His molecular studies by [facility] show he has MSI high which is a good marker for response to immunotherapy which will be important for future treatment.  The patient will complete SRS to the solidary brain metastasis under the care of Dr. Lisbeth Renshaw later today on 11/14/19.    He then underwent a course of weekly concurrent chemoradiation with carboplatin for an AUC of 2 and paclitaxel 45 mg per metered squared.  He tolerated this treatment well except for fatigue and mild odynophagia.  The patient showed evidence of disease progression with an increase in volume of the right infrahilar mass.  Since the patient has MSI high, the patient then underwent treatment with single agent immunotherapy with Keytruda 200 mg IV every 3 weeks.  The plan is to complete a total of 2 years unless the patient has unacceptable toxicity or disease progression.  The patient is status post 27 cycles.  He also received 7 cycles of Avastin for vasogenic edema in the brain.  Avastin has been on hold unless needed again in the future.    Labs were reviewed. Recommend that he proceed with cycle #28 today as scheduled.   For his rash, he will continue to use kenalog cream as that has been helpful for him. He is scheduled to see dermatology, unfortunately this is not until August.     We will see him back for follow-up visit in 3 weeks for evaluation before starting cycle #29  The patient was advised to call immediately if he has any concerning symptoms in the interval. The patient voices understanding of current disease status and treatment options and is in agreement with the current care plan. All questions were answered. The patient knows to call the clinic with any problems, questions or concerns. We can certainly see the patient much sooner if necessary  No orders of the defined types were placed in this encounter.     The total time  spent in the appointment was 20-29 minutes  Jaquayla Hege L Alvis Edgell, PA-C 10/05/21

## 2021-09-27 NOTE — Telephone Encounter (Signed)
Called patient regarding upcoming appointments, patient is notified. 

## 2021-09-29 ENCOUNTER — Other Ambulatory Visit: Payer: Self-pay | Admitting: Radiation Therapy

## 2021-10-04 ENCOUNTER — Other Ambulatory Visit: Payer: Self-pay | Admitting: Internal Medicine

## 2021-10-04 DIAGNOSIS — L299 Pruritus, unspecified: Secondary | ICD-10-CM

## 2021-10-05 ENCOUNTER — Inpatient Hospital Stay: Payer: Managed Care, Other (non HMO) | Attending: Internal Medicine | Admitting: Physician Assistant

## 2021-10-05 ENCOUNTER — Other Ambulatory Visit: Payer: Managed Care, Other (non HMO)

## 2021-10-05 ENCOUNTER — Other Ambulatory Visit: Payer: Self-pay

## 2021-10-05 ENCOUNTER — Inpatient Hospital Stay: Payer: Managed Care, Other (non HMO)

## 2021-10-05 VITALS — BP 134/87 | HR 91 | Temp 98.5°F | Resp 18 | Ht 68.0 in | Wt 183.9 lb

## 2021-10-05 DIAGNOSIS — Z79899 Other long term (current) drug therapy: Secondary | ICD-10-CM | POA: Insufficient documentation

## 2021-10-05 DIAGNOSIS — C3431 Malignant neoplasm of lower lobe, right bronchus or lung: Secondary | ICD-10-CM | POA: Diagnosis present

## 2021-10-05 DIAGNOSIS — C7931 Secondary malignant neoplasm of brain: Secondary | ICD-10-CM | POA: Insufficient documentation

## 2021-10-05 DIAGNOSIS — C3491 Malignant neoplasm of unspecified part of right bronchus or lung: Secondary | ICD-10-CM | POA: Diagnosis not present

## 2021-10-05 DIAGNOSIS — Z5112 Encounter for antineoplastic immunotherapy: Secondary | ICD-10-CM | POA: Diagnosis not present

## 2021-10-05 DIAGNOSIS — Z95828 Presence of other vascular implants and grafts: Secondary | ICD-10-CM

## 2021-10-05 DIAGNOSIS — R1310 Dysphagia, unspecified: Secondary | ICD-10-CM | POA: Insufficient documentation

## 2021-10-05 DIAGNOSIS — R21 Rash and other nonspecific skin eruption: Secondary | ICD-10-CM | POA: Insufficient documentation

## 2021-10-05 LAB — CBC WITH DIFFERENTIAL (CANCER CENTER ONLY)
Abs Immature Granulocytes: 0.03 10*3/uL (ref 0.00–0.07)
Basophils Absolute: 0.1 10*3/uL (ref 0.0–0.1)
Basophils Relative: 1 %
Eosinophils Absolute: 0.2 10*3/uL (ref 0.0–0.5)
Eosinophils Relative: 3 %
HCT: 40.8 % (ref 39.0–52.0)
Hemoglobin: 13.5 g/dL (ref 13.0–17.0)
Immature Granulocytes: 0 %
Lymphocytes Relative: 17 %
Lymphs Abs: 1.2 10*3/uL (ref 0.7–4.0)
MCH: 29.9 pg (ref 26.0–34.0)
MCHC: 33.1 g/dL (ref 30.0–36.0)
MCV: 90.3 fL (ref 80.0–100.0)
Monocytes Absolute: 0.6 10*3/uL (ref 0.1–1.0)
Monocytes Relative: 9 %
Neutro Abs: 4.7 10*3/uL (ref 1.7–7.7)
Neutrophils Relative %: 70 %
Platelet Count: 219 10*3/uL (ref 150–400)
RBC: 4.52 MIL/uL (ref 4.22–5.81)
RDW: 13 % (ref 11.5–15.5)
WBC Count: 6.8 10*3/uL (ref 4.0–10.5)
nRBC: 0 % (ref 0.0–0.2)

## 2021-10-05 LAB — CMP (CANCER CENTER ONLY)
ALT: 18 U/L (ref 0–44)
AST: 20 U/L (ref 15–41)
Albumin: 4.3 g/dL (ref 3.5–5.0)
Alkaline Phosphatase: 68 U/L (ref 38–126)
Anion gap: 5 (ref 5–15)
BUN: 13 mg/dL (ref 6–20)
CO2: 26 mmol/L (ref 22–32)
Calcium: 9.4 mg/dL (ref 8.9–10.3)
Chloride: 104 mmol/L (ref 98–111)
Creatinine: 0.94 mg/dL (ref 0.61–1.24)
GFR, Estimated: >60 mL/min (ref >60)
Glucose, Bld: 122 mg/dL — ABNORMAL HIGH (ref 70–99)
Potassium: 4.3 mmol/L (ref 3.5–5.1)
Sodium: 135 mmol/L (ref 135–145)
Total Bilirubin: 0.6 mg/dL (ref 0.3–1.2)
Total Protein: 7.2 g/dL (ref 6.5–8.1)

## 2021-10-05 LAB — TSH: TSH: 4.127 u[IU]/mL (ref 0.350–4.500)

## 2021-10-05 MED ORDER — SODIUM CHLORIDE 0.9 % IV SOLN: Freq: Once | INTRAVENOUS | Status: AC

## 2021-10-05 MED ORDER — HEPARIN SOD (PORK) LOCK FLUSH 100 UNIT/ML IV SOLN
500.0000 [IU] | Freq: Once | INTRAVENOUS | Status: AC | PRN
  Administered 2021-10-05: 500 [IU]

## 2021-10-05 MED ORDER — SODIUM CHLORIDE 0.9% FLUSH
10.0000 mL | Freq: Once | INTRAVENOUS | Status: AC
  Administered 2021-10-05: 10 mL

## 2021-10-05 MED ORDER — SODIUM CHLORIDE 0.9 % IV SOLN
200.0000 mg | Freq: Once | INTRAVENOUS | Status: AC
  Administered 2021-10-05: 200 mg via INTRAVENOUS
  Filled 2021-10-05: qty 200

## 2021-10-05 MED ORDER — SODIUM CHLORIDE 0.9% FLUSH
10.0000 mL | INTRAVENOUS | Status: DC | PRN
  Administered 2021-10-05: 10 mL

## 2021-10-05 NOTE — Patient Instructions (Signed)
[facility]  Discharge Instructions: ?Thank you for choosing [facility] to provide your oncology and hematology care.  ? ?If you have a lab appointment with the Cancer Center, please go directly to the Cancer Center and check in at the registration area. ?  ?Wear comfortable clothing and clothing appropriate for easy access to any Portacath or PICC line.  ? ?We strive to give you quality time with your provider. You may need to reschedule your appointment if you arrive late (15 or more minutes).  Arriving late affects you and other patients whose appointments are after yours.  Also, if you miss three or more appointments without notifying the office, you may be dismissed from the clinic at the provider?s discretion.    ?  ?For prescription refill requests, have your pharmacy contact our office and allow 72 hours for refills to be completed.   ? ?Today you received the following chemotherapy and/or immunotherapy agents: Keytruda ?  ?To help prevent nausea and vomiting after your treatment, we encourage you to take your nausea medication as directed. ? ?BELOW ARE SYMPTOMS THAT SHOULD BE REPORTED IMMEDIATELY: ?*FEVER GREATER THAN 100.4 F (38 ?C) OR HIGHER ?*CHILLS OR SWEATING ?*NAUSEA AND VOMITING THAT IS NOT CONTROLLED WITH YOUR NAUSEA MEDICATION ?*UNUSUAL SHORTNESS OF BREATH ?*UNUSUAL BRUISING OR BLEEDING ?*URINARY PROBLEMS (pain or burning when urinating, or frequent urination) ?*BOWEL PROBLEMS (unusual diarrhea, constipation, pain near the anus) ?TENDERNESS IN MOUTH AND THROAT WITH OR WITHOUT PRESENCE OF ULCERS (sore throat, sores in mouth, or a toothache) ?UNUSUAL RASH, SWELLING OR PAIN  ?UNUSUAL VAGINAL DISCHARGE OR ITCHING  ? ?Items with * indicate a potential emergency and should be followed up as soon as possible or go to the Emergency Department if any problems should occur. ? ?Please show the CHEMOTHERAPY ALERT CARD or IMMUNOTHERAPY ALERT CARD at check-in to the  Emergency Department and triage nurse. ? ?Should you have questions after your visit or need to cancel or reschedule your appointment, please contact [facility]  Dept: [phone]  and follow the prompts.  Office hours are 8:00 a.m. to 4:30 p.m. Monday - Friday. Please note that voicemails left after 4:00 p.m. may not be returned until the following business day.  We are closed weekends and major holidays. You have access to a nurse at all times for urgent questions. Please call the main number to the clinic Dept: [phone] and follow the prompts. ? ? ?For any non-urgent questions, you may also contact your provider using MyChart. We now offer e-Visits for anyone 18 and older to request care online for non-urgent symptoms. For details visit [email or web address]. ?  ?Also download the MyChart app! Go to the app store, search "MyChart", open the app, select [facility], and log in with your MyChart username and password. ? ?Due to Covid, a mask is required upon entering the hospital/clinic. If you do not have a mask, one will be given to you upon arrival. For doctor visits, patients may have 1 support person aged 18 or older with them. For treatment visits, patients cannot have anyone with them due to current Covid guidelines and our immunocompromised population.  ? ?

## 2021-10-27 ENCOUNTER — Inpatient Hospital Stay: Payer: Managed Care, Other (non HMO) | Attending: Internal Medicine | Admitting: Internal Medicine

## 2021-10-27 ENCOUNTER — Inpatient Hospital Stay: Payer: Managed Care, Other (non HMO)

## 2021-10-27 ENCOUNTER — Other Ambulatory Visit: Payer: Self-pay

## 2021-10-27 ENCOUNTER — Other Ambulatory Visit: Payer: Managed Care, Other (non HMO)

## 2021-10-27 VITALS — BP 135/81 | HR 84 | Temp 97.9°F | Resp 17 | Wt 184.9 lb

## 2021-10-27 DIAGNOSIS — Z79899 Other long term (current) drug therapy: Secondary | ICD-10-CM | POA: Diagnosis not present

## 2021-10-27 DIAGNOSIS — I10 Essential (primary) hypertension: Secondary | ICD-10-CM | POA: Diagnosis not present

## 2021-10-27 DIAGNOSIS — F1010 Alcohol abuse, uncomplicated: Secondary | ICD-10-CM | POA: Insufficient documentation

## 2021-10-27 DIAGNOSIS — E785 Hyperlipidemia, unspecified: Secondary | ICD-10-CM | POA: Insufficient documentation

## 2021-10-27 DIAGNOSIS — Z5112 Encounter for antineoplastic immunotherapy: Secondary | ICD-10-CM

## 2021-10-27 DIAGNOSIS — E039 Hypothyroidism, unspecified: Secondary | ICD-10-CM | POA: Diagnosis not present

## 2021-10-27 DIAGNOSIS — C3431 Malignant neoplasm of lower lobe, right bronchus or lung: Secondary | ICD-10-CM | POA: Insufficient documentation

## 2021-10-27 DIAGNOSIS — K219 Gastro-esophageal reflux disease without esophagitis: Secondary | ICD-10-CM | POA: Diagnosis not present

## 2021-10-27 DIAGNOSIS — C3491 Malignant neoplasm of unspecified part of right bronchus or lung: Secondary | ICD-10-CM

## 2021-10-27 DIAGNOSIS — Z95828 Presence of other vascular implants and grafts: Secondary | ICD-10-CM

## 2021-10-27 DIAGNOSIS — C7931 Secondary malignant neoplasm of brain: Secondary | ICD-10-CM | POA: Diagnosis not present

## 2021-10-27 LAB — CBC WITH DIFFERENTIAL (CANCER CENTER ONLY)
Abs Immature Granulocytes: 0.03 10*3/uL (ref 0.00–0.07)
Basophils Absolute: 0.1 10*3/uL (ref 0.0–0.1)
Basophils Relative: 1 %
Eosinophils Absolute: 0.2 10*3/uL (ref 0.0–0.5)
Eosinophils Relative: 3 %
HCT: 37.3 % — ABNORMAL LOW (ref 39.0–52.0)
Hemoglobin: 12.6 g/dL — ABNORMAL LOW (ref 13.0–17.0)
Immature Granulocytes: 0 %
Lymphocytes Relative: 18 %
Lymphs Abs: 1.3 10*3/uL (ref 0.7–4.0)
MCH: 30.1 pg (ref 26.0–34.0)
MCHC: 33.8 g/dL (ref 30.0–36.0)
MCV: 89.2 fL (ref 80.0–100.0)
Monocytes Absolute: 0.6 10*3/uL (ref 0.1–1.0)
Monocytes Relative: 9 %
Neutro Abs: 4.8 10*3/uL (ref 1.7–7.7)
Neutrophils Relative %: 69 %
Platelet Count: 207 10*3/uL (ref 150–400)
RBC: 4.18 MIL/uL — ABNORMAL LOW (ref 4.22–5.81)
RDW: 13 % (ref 11.5–15.5)
WBC Count: 7 10*3/uL (ref 4.0–10.5)
nRBC: 0 % (ref 0.0–0.2)

## 2021-10-27 LAB — CMP (CANCER CENTER ONLY)
ALT: 16 U/L (ref 0–44)
AST: 19 U/L (ref 15–41)
Albumin: 4.2 g/dL (ref 3.5–5.0)
Alkaline Phosphatase: 71 U/L (ref 38–126)
Anion gap: 5 (ref 5–15)
BUN: 11 mg/dL (ref 6–20)
CO2: 26 mmol/L (ref 22–32)
Calcium: 9.1 mg/dL (ref 8.9–10.3)
Chloride: 105 mmol/L (ref 98–111)
Creatinine: 0.95 mg/dL (ref 0.61–1.24)
GFR, Estimated: >60 mL/min (ref >60)
Glucose, Bld: 127 mg/dL — ABNORMAL HIGH (ref 70–99)
Potassium: 4 mmol/L (ref 3.5–5.1)
Sodium: 136 mmol/L (ref 135–145)
Total Bilirubin: 0.5 mg/dL (ref 0.3–1.2)
Total Protein: 6.9 g/dL (ref 6.5–8.1)

## 2021-10-27 LAB — TSH: TSH: 2.544 u[IU]/mL (ref 0.350–4.500)

## 2021-10-27 MED ORDER — SODIUM CHLORIDE 0.9% FLUSH
10.0000 mL | INTRAVENOUS | Status: DC | PRN
  Administered 2021-10-27: 10 mL

## 2021-10-27 MED ORDER — SODIUM CHLORIDE 0.9 % IV SOLN: Freq: Once | INTRAVENOUS | Status: AC

## 2021-10-27 MED ORDER — SODIUM CHLORIDE 0.9 % IV SOLN
200.0000 mg | Freq: Once | INTRAVENOUS | Status: AC
  Administered 2021-10-27: 200 mg via INTRAVENOUS
  Filled 2021-10-27: qty 200

## 2021-10-27 NOTE — Progress Notes (Signed)
[facility] [phone]   [phone]  OFFICE PROGRESS NOTE  [name], NP [name] [place] [address]  DIAGNOSIS: Stage IV (T3, N0, M1C) non-small cell lung cancer, adenocarcinoma.  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 He was diagnosed in July 2021.   Molecular Biomarkers:  MSI-High DETECTED Pembrolizumab Atezolizumab, Avelumab, Cemiplimab, Dostarlimab, Durvalumab, Ipilimumab, Nivolumab   STK11Splice Site SNV 1.6% Everolimus, Temsirolimus Yes   [id number] 1.7% Binimetinib Yes   [id number] 0.4%   Niraparib, Olaparib, Rucaparib, Talazoparib, Tazemetostat Yes   PRIOR THERAPY:  1) SRS to the solitary brain metastasis under the care of Dr. Lisbeth Renshaw. Last treatment 11/14/19. 2) Weekly concurrent chemoradiation with carboplatin for an AUC of 2, paclitaxel 45 mg/m2.  First dose expected on 11/25/2019. Status post 7 cycles, last dose was given 01/06/2020 with partial response.    CURRENT THERAPY:  1)  Immunotherapy with Keytruda 200 mg IV every 3 weeks.  First dose February 10, 2020 for a patient with MSI high.  Status post 28  cycles. 2) Avastin 15 mg/KG every 3 weeks.  First dose today for the vasogenic edema of the brain.S/P 7 cycles.  INTERVAL HISTORY: Joseph Atkins 61 y.o. male returns to the clinic today for follow-up visit accompanied by his wife.  The patient is feeling fine today with no concerning complaints except for the sporadic skin lesion likely secondary to immunotherapy mediated process from treatment with Keytruda.  He is scheduled to see a dermatologist soon for evaluation of these lesions and recommendation regarding his treatment.  He may benefit from biopsy of 1 of this lesion.  The patient denied having any current chest pain, shortness of breath, cough or hemoptysis.  He has no nausea, vomiting, diarrhea or  constipation.  He denied having any headache or visual changes.  He is here today for evaluation before starting cycle #29.   MEDICAL HISTORY: Past Medical History:  Diagnosis Date   Cancer ([place])    lung cancer   Diverticulosis    GERD (gastroesophageal reflux disease)    Hx of small bowel obstruction    Hyperlipidemia    Hypertension    Hypothyroidism    IBS (irritable bowel syndrome)    Substance abuse ([place])    Alcoholic, Drug addition   Thyroid disease     ALLERGIES:  is allergic to penicillins.  MEDICATIONS:  Current Outpatient Medications  Medication Sig Dispense Refill   albuterol (PROVENTIL HFA;VENTOLIN HFA) 108 (90 Base) MCG/ACT inhaler Inhale 2 puffs into the lungs every 6 (six) hours as needed for wheezing or shortness of breath. 1 Inhaler 0   amLODipine (NORVASC) 5 MG tablet TAKE 1 TABLET (5 MG TOTAL) BY MOUTH DAILY. 90 tablet 1   ANORO ELLIPTA 62.5-25 MCG/INH AEPB 1 puff daily.     aspirin EC 81 MG tablet Take 1 tablet (81 mg total) by mouth daily. Swallow whole. 30 tablet 11   cyclobenzaprine (FLEXERIL) 10 MG tablet Take 10 mg by mouth 2 (two) times daily as needed for muscle spasms.     docusate sodium (COLACE) 100 MG capsule Take 100 mg by mouth daily.     DULoxetine (CYMBALTA) 20 MG capsule Take 1 capsule (20 mg total) by mouth 2 (two) times daily. 60 capsule 5   fluticasone furoate-vilanterol (BREO ELLIPTA) 100-25 MCG/INH AEPB Inhale 1 puff into the lungs daily. 30 each  3   levothyroxine (SYNTHROID) 100 MCG tablet TAKE 1 TABLET BY MOUTH EVERY DAY 90 tablet 1   lidocaine-prilocaine (EMLA) cream Apply 1 application topically as needed. 30 g 0   omeprazole (PRILOSEC) 40 MG capsule Take 1 capsule (40 mg total) by mouth daily. 90 capsule 4   ondansetron (ZOFRAN) 4 MG tablet Take 1 tablet (4 mg total) by mouth every 8 (eight) hours as needed. 40 tablet 2   oxyCODONE-acetaminophen (PERCOCET/ROXICET) 5-325 MG tablet Take 1 tablet by mouth every 4 (four) hours as needed  for severe pain. 30 tablet 0   pembrolizumab (KEYTRUDA) 100 MG/4ML SOLN See admin instructions.     polyethylene glycol powder (MIRALAX) powder Take 17 g by mouth daily. 255 g 11   prochlorperazine (COMPAZINE) 10 MG tablet Take 1 tablet (10 mg total) by mouth every 6 (six) hours as needed. 30 tablet 2   triamcinolone cream (KENALOG) 0.1 % APPLY TOPICALLY 2 TIMES DAILY AS NEEDED. 454 g 0   TRULANCE 3 MG TABS Take 1 tablet by mouth daily.     zolpidem (AMBIEN CR) 12.5 MG CR tablet Take 1 tablet (12.5 mg total) by mouth at bedtime as needed for sleep. 30 tablet 5   No current facility-administered medications for this visit.    SURGICAL HISTORY:  Past Surgical History:  Procedure Laterality Date   arm surgery Right    BRONCHIAL BRUSHINGS  10/24/2019   Procedure: BRONCHIAL BRUSHINGS;  Surgeon: Collene Gobble, MD;  Location: [facility] ENDOSCOPY;  Service: Cardiopulmonary;;  right lower lobe    BRONCHIAL BRUSHINGS  11/05/2019   Procedure: BRONCHIAL BRUSHINGS;  Surgeon: Collene Gobble, MD;  Location: [facility] ENDOSCOPY;  Service: Pulmonary;;   BRONCHIAL NEEDLE ASPIRATION BIOPSY  10/24/2019   Procedure: BRONCHIAL NEEDLE ASPIRATION BIOPSIES;  Surgeon: Collene Gobble, MD;  Location: [facility];  Service: Cardiopulmonary;;   BRONCHIAL NEEDLE ASPIRATION BIOPSY  11/05/2019   Procedure: BRONCHIAL NEEDLE ASPIRATION BIOPSIES;  Surgeon: Collene Gobble, MD;  Location: [facility] ENDOSCOPY;  Service: Pulmonary;;   ENDOBRONCHIAL ULTRASOUND N/A 10/24/2019   Procedure: ENDOBRONCHIAL ULTRASOUND;  Surgeon: Collene Gobble, MD;  Location: [facility];  Service: Cardiopulmonary;  Laterality: N/A;   FINGER SURGERY Right    Middle   IR IMAGING GUIDED PORT INSERTION  11/19/2019   VIDEO BRONCHOSCOPY N/A 10/24/2019   Procedure: VIDEO BRONCHOSCOPY WITHOUT FLUORO;  Surgeon: Collene Gobble, MD;  Location: [facility];  Service: Cardiopulmonary;  Laterality: N/A;   VIDEO BRONCHOSCOPY WITH ENDOBRONCHIAL NAVIGATION N/A 11/05/2019   Procedure:  VIDEO BRONCHOSCOPY WITH ENDOBRONCHIAL NAVIGATION;  Surgeon: Collene Gobble, MD;  Location: [facility] ENDOSCOPY;  Service: Pulmonary;  Laterality: N/A;    REVIEW OF SYSTEMS:  A comprehensive review of systems was negative except for: Constitutional: positive for fatigue Integument/breast: positive for skin lesion(s)   PHYSICAL EXAMINATION: General appearance: alert, cooperative, fatigued, and no distress Head: Normocephalic, without obvious abnormality, atraumatic Neck: no adenopathy, no JVD, supple, symmetrical, trachea midline, and thyroid not enlarged, symmetric, no tenderness/mass/nodules Lymph nodes: Cervical, supraclavicular, and axillary nodes normal. Resp: clear to auscultation bilaterally Back: symmetric, no curvature. ROM normal. No CVA tenderness. Cardio: regular rate and rhythm, S1, S2 normal, no murmur, click, rub or gallop GI: soft, non-tender; bowel sounds normal; no masses,  no organomegaly Extremities: extremities normal, atraumatic, no cyanosis or edema      ECOG PERFORMANCE STATUS: 1 - Symptomatic but completely ambulatory  Blood pressure 135/81, pulse 84, temperature 97.9 F (36.6 C), temperature source Oral, resp. rate 17, weight  184 lb 14.4 oz (83.9 kg), SpO2 100 %.  LABORATORY DATA: Lab Results  Component Value Date   WBC 7.0 10/27/2021   HGB 12.6 (L) 10/27/2021   HCT 37.3 (L) 10/27/2021   MCV 89.2 10/27/2021   PLT 207 10/27/2021      Chemistry      Component Value Date/Time   NA 135 10/05/2021 0925   NA 141 03/08/2017 1707   K 4.3 10/05/2021 0925   CL 104 10/05/2021 0925   CO2 26 10/05/2021 0925   BUN 13 10/05/2021 0925   BUN 12 03/08/2017 1707   CREATININE 0.94 10/05/2021 0925      Component Value Date/Time   CALCIUM 9.4 10/05/2021 0925   ALKPHOS 68 10/05/2021 0925   AST 20 10/05/2021 0925   ALT 18 10/05/2021 0925   BILITOT 0.6 10/05/2021 0925       RADIOGRAPHIC STUDIES: No results found.   ASSESSMENT AND PLAN: This is a very pleasant 61  years old white male with stage IV (T3, N0, M1c) non-small cell lung cancer, adenocarcinoma with MSI high presented with right lower lobe/infrahilar mass in addition to solitary brain metastasis in the left cerebellum diagnosed in July 2021. He is status post SRS to the solitary brain metastasis. The patient completed a course of concurrent chemoradiation with weekly carboplatin and paclitaxel.  He tolerated the treatment well except for fatigue and mild odynophagia. The patient has MSI high and I recommended for him treatment with immunotherapy with single agent Keytruda 200 mg IV every 3 weeks for a total of 2 years unless the patient has unacceptable toxicity or disease progression. He is status post 28  cycles of treatment with Keytruda.  He also received 7 cycles of Avastin for the vasogenic edema in the brain.   Avastin will be on hold for now unless needed in the future. The patient has been tolerating his treatment with Keytruda fairly well except for the skin lesion as shown in the pictures above.  He is scheduled to see dermatology for evaluation and consideration of biopsy of 1 of this lesion. I recommended for him to continue his treatment with [facility] and he will proceed with cycle #29 today. For the skin lesion, he will continue applying Kenalog cream to this area.  He is also scheduled to see dermatology soon. I will see him back for follow-up visit in 3 weeks for evaluation before starting cycle #30. The patient was advised to call immediately if he has any other concerning symptoms in the interval.  The patient voices understanding of current disease status and treatment options and is in agreement with the current care plan.  All questions were answered. The patient knows to call the clinic with any problems, questions or concerns. We can certainly see the patient much sooner if necessary.  Disclaimer: This note was dictated with voice recognition software. Similar sounding words  can inadvertently be transcribed and may not be corrected upon review.

## 2021-10-27 NOTE — Patient Instructions (Signed)
Pembrolizumab injection What is this medication? PEMBROLIZUMAB (pem broe liz ue mab) is a monoclonal antibody. It is used to treat certain types of cancer. This medicine may be used for other purposes; ask your health care provider or pharmacist if you have questions. COMMON BRAND NAME(S): Keytruda What should I tell my care team before I take this medication? They need to know if you have any of these conditions: autoimmune diseases like Crohn's disease, ulcerative colitis, or lupus have had or planning to have an allogeneic stem cell transplant (uses someone else's stem cells) history of organ transplant history of chest radiation nervous system problems like myasthenia gravis or Guillain-Barre syndrome an unusual or allergic reaction to pembrolizumab, other medicines, foods, dyes, or preservatives pregnant or trying to get pregnant breast-feeding How should I use this medication? This medicine is for infusion into a vein. It is given by a health care professional in a hospital or clinic setting. A special MedGuide will be given to you before each treatment. Be sure to read this information carefully each time. Talk to your pediatrician regarding the use of this medicine in children. While this drug may be prescribed for children as young as 6 months for selected conditions, precautions do apply. Overdosage: If you think you have taken too much of this medicine contact a poison control center or emergency room at once. NOTE: This medicine is only for you. Do not share this medicine with others. What if I miss a dose? It is important not to miss your dose. Call your doctor or health care professional if you are unable to keep an appointment. What may interact with this medication? Interactions have not been studied. This list may not describe all possible interactions. Give your health care provider a list of all the medicines, herbs, non-prescription drugs, or dietary supplements you use.  Also tell them if you smoke, drink alcohol, or use illegal drugs. Some items may interact with your medicine. What should I watch for while using this medication? Your condition will be monitored carefully while you are receiving this medicine. You may need blood work done while you are taking this medicine. Do not become pregnant while taking this medicine or for 4 months after stopping it. Women should inform their doctor if they wish to become pregnant or think they might be pregnant. There is a potential for serious side effects to an unborn child. Talk to your health care professional or pharmacist for more information. Do not breast-feed an infant while taking this medicine or for 4 months after the last dose. What side effects may I notice from receiving this medication? Side effects that you should report to your doctor or health care professional as soon as possible: allergic reactions like skin rash, itching or hives, swelling of the face, lips, or tongue bloody or black, tarry breathing problems changes in vision chest pain chills confusion constipation cough diarrhea dizziness or feeling faint or lightheaded fast or irregular heartbeat fever flushing joint pain low blood counts - this medicine may decrease the number of white blood cells, red blood cells and platelets. You may be at increased risk for infections and bleeding. muscle pain muscle weakness pain, tingling, numbness in the hands or feet persistent headache redness, blistering, peeling or loosening of the skin, including inside the mouth signs and symptoms of high blood sugar such as dizziness; dry mouth; dry skin; fruity breath; nausea; stomach pain; increased hunger or thirst; increased urination signs and symptoms of kidney injury like trouble  passing urine or change in the amount of urine signs and symptoms of liver injury like dark urine, light-colored stools, loss of appetite, nausea, right upper belly pain,  yellowing of the eyes or skin sweating swollen lymph nodes weight loss Side effects that usually do not require medical attention (report to your doctor or health care professional if they continue or are bothersome): decreased appetite hair loss tiredness This list may not describe all possible side effects. Call your doctor for medical advice about side effects. You may report side effects to FDA at 1-800-FDA-1088. Where should I keep my medication? This drug is given in a hospital or clinic and will not be stored at home. NOTE: This sheet is a summary. It may not cover all possible information. If you have questions about this medicine, talk to your doctor, pharmacist, or health care provider.  2023 Elsevier/Gold Standard (2021-03-12 00:00:00)

## 2021-11-15 ENCOUNTER — Other Ambulatory Visit: Payer: Self-pay

## 2021-11-15 ENCOUNTER — Telehealth: Payer: Self-pay | Admitting: Internal Medicine

## 2021-11-15 NOTE — Telephone Encounter (Signed)
Called patient regarding upcoming Rescheduled August appointment, left a voicemail.

## 2021-11-30 NOTE — Progress Notes (Addendum)
[facility] OFFICE PROGRESS NOTE  [name], NP [address] [name] [address] [place] [address]  DIAGNOSIS: Stage IV carcinoma, non-small cell lung cancer, adenocarcinoma.  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 He was diagnosed in July 2021.   Molecular Biomarkers:  MSI-High DETECTED Pembrolizumab Atezolizumab, Avelumab, Cemiplimab, Dostarlimab, Durvalumab, Ipilimumab, Nivolumab   STK11Splice Site SNV 4.4% Everolimus, Temsirolimus Yes   KRASG12D 1.7% Binimetinib Yes   [id number] 0.4%   Niraparib, Olaparib, Rucaparib, Talazoparib, Tazemetostat Yes  PRIOR THERAPY: 1) SRS to the solitary brain metastasis under the care of Dr. Lisbeth Renshaw. Last treatment 7/22/212)  2) Weekly concurrent chemoradiation with carboplatin for an AUC of 2, paclitaxel 45 mg/m2.  First dose expected on 11/25/2019. Status post 7 cycles, last dose was giving 01/06/2020 with partial response.   CURRENT THERAPY: 1)  Immunotherapy with Keytruda 200 mg IV every 3 weeks.  First dose February 10, 2020 for a patient with MSI high.  Status post 29 cycles. 2) Avastin 15 mg/KG every 3 weeks.  For the vasogenic edema of the brain.S/P 7 cycles.  INTERVAL HISTORY: Joseph Atkins 61 y.o. male returns to the clinic today for a follow-up visit accompanied by his wife.  The patient is tolerating single agent immunotherapy with Keytruda well without any new concerning adverse side effects except he was referred to dermatology over 6 months ago for pruritic nodular skin lesions on his arm, chest, back, and lower extremity.  He was supposed to have an appointment this month, but unfortunately, their office is closing.  He is requesting a referral to an alternative dermatology practice.  He uses Kenalog cream which helps his itching.  He is not using antihistamine.  He is also been having increased bruising on his upper  extremities bilaterally.  He is on 81 mg aspirin.  The patient denies any falls, injuries, or recent traumas.   Otherwise the patient is feeling fine.  He reports he has had more energy recently.  The patient denies any fever, chills, night sweats, or weight loss.  He reports stable dyspnea on exertion.  He reports his normal cough.  Denies significant cough, which is sometimes dry and sometimes has phlegm production. He denies any nausea, vomiting, constipation ,or diarrhea. He follows closely with neuro oncology for his history of metastatic disease to the brain. He is expected to have a repeat brain MRI in September 2023. He denies recent headaches.  He does feel like his vision is changing. The patient is here today for evaluation before starting cycle #30.     MEDICAL HISTORY: Past Medical History:  Diagnosis Date   Cancer ([place])    lung cancer   Diverticulosis    GERD (gastroesophageal reflux disease)    Hx of small bowel obstruction    Hyperlipidemia    Hypertension    Hypothyroidism    IBS (irritable bowel syndrome)    Substance abuse ([place])    Alcoholic, Drug addition   Thyroid disease     ALLERGIES:  is allergic to penicillins.  MEDICATIONS:  Current Outpatient Medications  Medication Sig Dispense Refill   albuterol (PROVENTIL HFA;VENTOLIN HFA) 108 (90 Base) MCG/ACT inhaler Inhale 2 puffs into the lungs every 6 (six) hours as needed for wheezing or shortness of breath. 1 Inhaler 0   amLODipine (NORVASC) 5 MG tablet TAKE 1 TABLET (5 MG TOTAL) BY MOUTH DAILY. 90 tablet 1   ANORO  ELLIPTA 62.5-25 MCG/INH AEPB 1 puff daily.     aspirin EC 81 MG tablet Take 1 tablet (81 mg total) by mouth daily. Swallow whole. 30 tablet 11   cyclobenzaprine (FLEXERIL) 10 MG tablet Take 10 mg by mouth 2 (two) times daily as needed for muscle spasms.     docusate sodium (COLACE) 100 MG capsule Take 100 mg by mouth daily.     DULoxetine (CYMBALTA) 20 MG capsule Take 1 capsule (20 mg total) by mouth 2  (two) times daily. 60 capsule 5   fluticasone furoate-vilanterol (BREO ELLIPTA) 100-25 MCG/INH AEPB Inhale 1 puff into the lungs daily. 30 each 3   levothyroxine (SYNTHROID) 100 MCG tablet TAKE 1 TABLET BY MOUTH EVERY DAY 90 tablet 1   lidocaine-prilocaine (EMLA) cream Apply 1 application topically as needed. 30 g 0   omeprazole (PRILOSEC) 40 MG capsule Take 1 capsule (40 mg total) by mouth daily. 90 capsule 4   ondansetron (ZOFRAN) 4 MG tablet Take 1 tablet (4 mg total) by mouth every 8 (eight) hours as needed. 40 tablet 2   oxyCODONE-acetaminophen (PERCOCET/ROXICET) 5-325 MG tablet Take 1 tablet by mouth every 4 (four) hours as needed for severe pain. 30 tablet 0   pembrolizumab (KEYTRUDA) 100 MG/4ML SOLN See admin instructions.     polyethylene glycol powder (MIRALAX) powder Take 17 g by mouth daily. 255 g 11   prochlorperazine (COMPAZINE) 10 MG tablet Take 1 tablet (10 mg total) by mouth every 6 (six) hours as needed. 30 tablet 2   triamcinolone cream (KENALOG) 0.1 % APPLY TOPICALLY 2 TIMES DAILY AS NEEDED. 454 g 0   TRULANCE 3 MG TABS Take 1 tablet by mouth daily.     zolpidem (AMBIEN CR) 12.5 MG CR tablet Take 1 tablet (12.5 mg total) by mouth at bedtime as needed for sleep. 30 tablet 5   No current facility-administered medications for this visit.    SURGICAL HISTORY:  Past Surgical History:  Procedure Laterality Date   arm surgery Right    BRONCHIAL BRUSHINGS  10/24/2019   Procedure: BRONCHIAL BRUSHINGS;  Surgeon: Collene Gobble, MD;  Location: [facility] ENDOSCOPY;  Service: Cardiopulmonary;;  right lower lobe    BRONCHIAL BRUSHINGS  11/05/2019   Procedure: BRONCHIAL BRUSHINGS;  Surgeon: Collene Gobble, MD;  Location: [facility] ENDOSCOPY;  Service: Pulmonary;;   BRONCHIAL NEEDLE ASPIRATION BIOPSY  10/24/2019   Procedure: BRONCHIAL NEEDLE ASPIRATION BIOPSIES;  Surgeon: Collene Gobble, MD;  Location: [facility];  Service: Cardiopulmonary;;   BRONCHIAL NEEDLE ASPIRATION BIOPSY  11/05/2019    Procedure: BRONCHIAL NEEDLE ASPIRATION BIOPSIES;  Surgeon: Collene Gobble, MD;  Location[facility] ENDOSCOPY;  Service: Pulmonary;;   ENDOBRONCHIAL ULTRASOUND N/A 10/24/2019   Procedure: ENDOBRONCHIAL ULTRASOUND;  Surgeon: Collene Gobble, MD;  Location: [facility];  Service: Cardiopulmonary;  Laterality: N/A;   FINGER SURGERY Right    Middle   IR IMAGING GUIDED PORT INSERTION  11/19/2019   VIDEO BRONCHOSCOPY N/A 10/24/2019   Procedure: VIDEO BRONCHOSCOPY WITHOUT FLUORO;  Surgeon: Collene Gobble, MD;  Location: [facility];  Service: Cardiopulmonary;  Laterality: N/A;   VIDEO BRONCHOSCOPY WITH ENDOBRONCHIAL NAVIGATION N/A 11/05/2019   Procedure: VIDEO BRONCHOSCOPY WITH ENDOBRONCHIAL NAVIGATION;  Surgeon: Collene Gobble, MD;  Location: [facility] ENDOSCOPY;  Service: Pulmonary;  Laterality: N/A;    REVIEW OF SYSTEMS:   Constitutional: Positive for improving fatigue. Negative for appetite change, chills, fever and unexpected weight change.  HENT: Negative for mouth sores, nosebleeds, sore throat and trouble swallowing.  Eyes: Negative for eye problems and icterus.  Respiratory: Positive for baseline dyspnea on exertion.  Positive for baseline cough.  Negative for hemoptysis and wheezing.   Cardiovascular: Negative for chest pain and leg swelling.  Gastrointestinal: Negative for abdominal pain, constipation, diarrhea, nausea and vomiting.  Genitourinary: Negative for bladder incontinence, difficulty urinating, dysuria, frequency and hematuria.   Musculoskeletal: Negative for back pain, gait problem, neck pain and neck stiffness.  Skin: Positive for dry scattered skin lesions/plaques.  Positive for bruising on upper extremities. Neurological: Negative for dizziness, extremity weakness, gait problem, headaches, light-headedness and seizures.  Hematological: Negative for adenopathy.  Seen on upper extremities. Psychiatric/Behavioral: Negative for confusion, depression and sleep disturbance. The patient is not  nervous/anxious.    PHYSICAL EXAMINATION:  Blood pressure 139/85, pulse 82, temperature 97.7 F (36.5 C), temperature source Oral, resp. rate 16, weight 183 lb 11.2 oz (83.3 kg), SpO2 100 %.  ECOG PERFORMANCE STATUS: 1  Physical Exam  Constitutional: Oriented to person, place, and time and well-developed, well-nourished, and in no distress.  HENT:  Head: Normocephalic and atraumatic.  Mouth/Throat: Oropharynx is clear and moist. No oropharyngeal exudate.  Eyes: Conjunctivae are normal. Right eye exhibits no discharge. Left eye exhibits no discharge. No scleral icterus.  Neck: Normal range of motion. Neck supple.  Cardiovascular: Normal rate, regular rhythm, normal heart sounds and intact distal pulses.   Pulmonary/Chest: Effort normal and breath sounds normal. No respiratory distress. No wheezes. No rales.  Abdominal: Soft. Bowel sounds are normal. Exhibits no distension and no mass. There is no tenderness.  Musculoskeletal: Normal range of motion. Exhibits no edema.  Lymphadenopathy:    No cervical adenopathy.  Neurological: Alert and oriented to person, place, and time. Exhibits normal muscle tone. Gait normal. Coordination normal.  Skin: Positive for nodular raised skin lesions on his arms, back, chest, lower extremities.  Positive for bruising on upper extremities.  Skin is warm and dry. Not diaphoretic. No erythema. No pallor.  Psychiatric: Mood, memory and judgment normal.  Vitals reviewed.  LABORATORY DATA: Lab Results  Component Value Date   WBC 6.4 12/08/2021   HGB 13.9 12/08/2021   HCT 41.2 12/08/2021   MCV 88.8 12/08/2021   PLT 207 12/08/2021      Chemistry      Component Value Date/Time   NA 136 10/27/2021 0923   NA 141 03/08/2017 1707   K 4.0 10/27/2021 0923   CL 105 10/27/2021 0923   CO2 26 10/27/2021 0923   BUN 11 10/27/2021 0923   BUN 12 03/08/2017 1707   CREATININE 0.95 10/27/2021 0923      Component Value Date/Time   CALCIUM 9.1 10/27/2021 0923    ALKPHOS 71 10/27/2021 0923   AST 19 10/27/2021 0923   ALT 16 10/27/2021 0923   BILITOT 0.5 10/27/2021 0923       RADIOGRAPHIC STUDIES:  No results found.   ASSESSMENT/PLAN:  This is a very pleasant 61 year old Caucasian male diagnosed with stage IV non-small cell lung cancer, adenocarcinoma.  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 He was diagnosed in July 2021. His molecular studies by [facility] show he has MSI high which is a good marker for response to immunotherapy which will be important for future treatment.    The patient will complete SRS to the solidary brain metastasis under the care of Dr. Lisbeth Renshaw later today on 11/14/19.   He then underwent a course of  weekly concurrent chemoradiation with carboplatin for an AUC of 2 and paclitaxel 45 mg per metered squared.  He tolerated this treatment well except for fatigue and mild odynophagia.  The patient showed evidence of disease progression with an increase in volume of the right infrahilar mass.  Since the patient has MSI high, the patient then underwent treatment with single agent immunotherapy with Keytruda 200 mg IV every 3 weeks.  The plan is to complete a total of 2 years unless the patient has unacceptable toxicity or disease progression.  The patient is status post 29 cycles.  He also received 7 cycles of Avastin for vasogenic edema in the brain.  Avastin has been on hold unless needed again in the future.    Labs were reviewed. His CMP is pending, as long as his CMP is within parameters, recommend that he proceed with cycle #30 today as scheduled.    We will see him back for follow-up visit in 3 weeks for evaluation before starting cycle #31   I will arrange for restaging CT scan of the chest, abdomen, pelvis prior to starting his next cycle of treatment.  The patient was previously referred to dermatology and was supposed to have an  appointment this month.  Unfortunately, the office is closing and the patient is requiring another referral.  The patient is going to call the different dermatology practice and see who accepts his insurance will call us back to let us know which office to place a referral to  Patient has bruising on his upper extremities.  He does take a 81 mg aspirin.  Denies any recent injuries or traumas.  Normal platelet count today.  We will continue to monitor.  For his itching, until he can see dermatology he will use his Kenalog cream.  Advised he may want to take Benadryl before bed since he scratches his lower extremities in his sleep.  Reviewed not to take Benadryl prior to driving as it may cause drowsiness.  The patient was advised to call immediately if he has any concerning symptoms in the interval. The patient voices understanding of current disease status and treatment options and is in agreement with the current care plan. All questions were answered. The patient knows to call the clinic with any problems, questions or concerns. We can certainly see the patient much sooner if necessary          Orders Placed This Encounter  Procedures   CT CHEST ABDOMEN PELVIS W CONTRAST    Standing Status:   Future    Standing Expiration Date:   12/09/2022    Order Specific Question:   Preferred imaging location?    Answer:   [facility]    Order Specific Question:   Is Oral Contrast requested for this exam?    Answer:   Yes, Per Radiology protocol      The total time spent in the appointment was 20-29 minutes.  Shainna Faux L Leland Raver, PA-C 12/08/21

## 2021-12-06 ENCOUNTER — Ambulatory Visit: Payer: Managed Care, Other (non HMO) | Admitting: Dermatology

## 2021-12-07 ENCOUNTER — Other Ambulatory Visit: Payer: Managed Care, Other (non HMO)

## 2021-12-07 ENCOUNTER — Ambulatory Visit: Payer: Managed Care, Other (non HMO) | Admitting: Physician Assistant

## 2021-12-07 ENCOUNTER — Ambulatory Visit: Payer: Managed Care, Other (non HMO)

## 2021-12-08 ENCOUNTER — Inpatient Hospital Stay (HOSPITAL_BASED_OUTPATIENT_CLINIC_OR_DEPARTMENT_OTHER): Payer: Managed Care, Other (non HMO) | Admitting: Physician Assistant

## 2021-12-08 ENCOUNTER — Other Ambulatory Visit: Payer: Self-pay | Admitting: Internal Medicine

## 2021-12-08 ENCOUNTER — Inpatient Hospital Stay: Payer: Managed Care, Other (non HMO)

## 2021-12-08 ENCOUNTER — Other Ambulatory Visit: Payer: Self-pay

## 2021-12-08 ENCOUNTER — Inpatient Hospital Stay: Payer: Managed Care, Other (non HMO) | Attending: Internal Medicine

## 2021-12-08 VITALS — BP 139/85 | HR 82 | Temp 97.7°F | Resp 16 | Wt 183.7 lb

## 2021-12-08 VITALS — BP 130/89 | HR 72 | Resp 16

## 2021-12-08 DIAGNOSIS — R233 Spontaneous ecchymoses: Secondary | ICD-10-CM | POA: Insufficient documentation

## 2021-12-08 DIAGNOSIS — Z5112 Encounter for antineoplastic immunotherapy: Secondary | ICD-10-CM

## 2021-12-08 DIAGNOSIS — C3431 Malignant neoplasm of lower lobe, right bronchus or lung: Secondary | ICD-10-CM | POA: Insufficient documentation

## 2021-12-08 DIAGNOSIS — L299 Pruritus, unspecified: Secondary | ICD-10-CM | POA: Insufficient documentation

## 2021-12-08 DIAGNOSIS — C3491 Malignant neoplasm of unspecified part of right bronchus or lung: Secondary | ICD-10-CM

## 2021-12-08 DIAGNOSIS — R1310 Dysphagia, unspecified: Secondary | ICD-10-CM | POA: Insufficient documentation

## 2021-12-08 DIAGNOSIS — C7931 Secondary malignant neoplasm of brain: Secondary | ICD-10-CM | POA: Diagnosis not present

## 2021-12-08 LAB — CBC WITH DIFFERENTIAL (CANCER CENTER ONLY)
Abs Immature Granulocytes: 0.02 K/uL (ref 0.00–0.07)
Basophils Absolute: 0.1 K/uL (ref 0.0–0.1)
Basophils Relative: 1 %
Eosinophils Absolute: 0.3 K/uL (ref 0.0–0.5)
Eosinophils Relative: 4 %
HCT: 41.2 % (ref 39.0–52.0)
Hemoglobin: 13.9 g/dL (ref 13.0–17.0)
Immature Granulocytes: 0 %
Lymphocytes Relative: 16 %
Lymphs Abs: 1 K/uL (ref 0.7–4.0)
MCH: 30 pg (ref 26.0–34.0)
MCHC: 33.7 g/dL (ref 30.0–36.0)
MCV: 88.8 fL (ref 80.0–100.0)
Monocytes Absolute: 0.5 K/uL (ref 0.1–1.0)
Monocytes Relative: 8 %
Neutro Abs: 4.5 K/uL (ref 1.7–7.7)
Neutrophils Relative %: 71 %
Platelet Count: 207 K/uL (ref 150–400)
RBC: 4.64 MIL/uL (ref 4.22–5.81)
RDW: 13.2 % (ref 11.5–15.5)
WBC Count: 6.4 K/uL (ref 4.0–10.5)
nRBC: 0 % (ref 0.0–0.2)

## 2021-12-08 LAB — TSH: TSH: 1.433 u[IU]/mL (ref 0.350–4.500)

## 2021-12-08 LAB — CMP (CANCER CENTER ONLY)
ALT: 20 U/L (ref 0–44)
AST: 25 U/L (ref 15–41)
Albumin: 4.5 g/dL (ref 3.5–5.0)
Alkaline Phosphatase: 66 U/L (ref 38–126)
Anion gap: 6 (ref 5–15)
BUN: 10 mg/dL (ref 6–20)
CO2: 25 mmol/L (ref 22–32)
Calcium: 9.4 mg/dL (ref 8.9–10.3)
Chloride: 105 mmol/L (ref 98–111)
Creatinine: 0.96 mg/dL (ref 0.61–1.24)
GFR, Estimated: >60 mL/min
Glucose, Bld: 150 mg/dL — ABNORMAL HIGH (ref 70–99)
Potassium: 3.8 mmol/L (ref 3.5–5.1)
Sodium: 136 mmol/L (ref 135–145)
Total Bilirubin: 0.6 mg/dL (ref 0.3–1.2)
Total Protein: 7.3 g/dL (ref 6.5–8.1)

## 2021-12-08 MED ORDER — SODIUM CHLORIDE 0.9% FLUSH
10.0000 mL | INTRAVENOUS | Status: DC | PRN
  Administered 2021-12-08: 10 mL

## 2021-12-08 MED ORDER — SODIUM CHLORIDE 0.9 % IV SOLN: Freq: Once | INTRAVENOUS | Status: AC

## 2021-12-08 MED ORDER — SODIUM CHLORIDE 0.9 % IV SOLN
200.0000 mg | Freq: Once | INTRAVENOUS | Status: AC
  Administered 2021-12-08: 200 mg via INTRAVENOUS
  Filled 2021-12-08: qty 200

## 2021-12-08 MED ORDER — HEPARIN SOD (PORK) LOCK FLUSH 100 UNIT/ML IV SOLN
500.0000 [IU] | Freq: Once | INTRAVENOUS | Status: AC | PRN
  Administered 2021-12-08: 500 [IU]

## 2021-12-08 NOTE — Patient Instructions (Signed)
[facility] ONCOLOG[facility]  Discharge Instructions: Thank you for choosing [facility] to provide your oncology and hematology care.   If you have a lab appointment with the [facility], please go directly to the [facility] and check in at the registration area.   Wear comfortable clothing and clothing appropriate for easy access to any Portacath or PICC line.   We strive to give you quality time with your provider. You may need to reschedule your appointment if you arrive late (15 or more minutes).  Arriving late affects you and other patients whose appointments are after yours.  Also, if you miss three or more appointments without notifying the office, you may be dismissed from the clinic at the provider's discretion.      For prescription refill requests, have your pharmacy contact our office and allow 72 hours for refills to be completed.    Today you received the following chemotherapy and/or immunotherapy agents: Keytruda      To help prevent nausea and vomiting after your treatment, we encourage you to take your nausea medication as directed.  BELOW ARE SYMPTOMS THAT SHOULD BE REPORTED IMMEDIATELY: *FEVER GREATER THAN 100.4 F (38 C) OR HIGHER *CHILLS OR SWEATING *NAUSEA AND VOMITING THAT IS NOT CONTROLLED WITH YOUR NAUSEA MEDICATION *UNUSUAL SHORTNESS OF BREATH *UNUSUAL BRUISING OR BLEEDING *URINARY PROBLEMS (pain or burning when urinating, or frequent urination) *BOWEL PROBLEMS (unusual diarrhea, constipation, pain near the anus) TENDERNESS IN MOUTH AND THROAT WITH OR WITHOUT PRESENCE OF ULCERS (sore throat, sores in mouth, or a toothache) UNUSUAL RASH, SWELLING OR PAIN  UNUSUAL VAGINAL DISCHARGE OR ITCHING   Items with * indicate a potential emergency and should be followed up as soon as possible or go to the Emergency Department if any problems should occur.  Please show the CHEMOTHERAPY ALERT CARD or IMMUNOTHERAPY ALERT CARD at check-in to  the Emergency Department and triage nurse.  Should you have questions after your visit or need to cancel or reschedule your appointment, please contact [facility]  Dept: [phone]  and follow the prompts.  Office hours are 8:00 a.m. to 4:30 p.m. Monday - Friday. Please note that voicemails left after 4:00 p.m. may not be returned until the following business day.  We are closed weekends and major holidays. You have access to a nurse at all times for urgent questions. Please call the main number to the clinic Dept: [phone] and follow the prompts.   For any non-urgent questions, you may also contact your provider using MyChart. We now offer e-Visits for anyone 28 and older to request care online for non-urgent symptoms. For details visit [email or web address].   Also download the MyChart app! Go to the app store, search "MyChart", open the app, select [facility], and log in with your MyChart username and password.  Masks are optional in the cancer centers. If you would like for your care team to wear a mask while they are taking care of you, please let them know. You may have one support person who is at least 60 years old accompany you for your appointments.

## 2021-12-10 ENCOUNTER — Other Ambulatory Visit: Payer: Self-pay

## 2021-12-16 ENCOUNTER — Other Ambulatory Visit: Payer: Self-pay

## 2021-12-20 ENCOUNTER — Ambulatory Visit (HOSPITAL_COMMUNITY)
Admission: RE | Admit: 2021-12-20 | Discharge: 2021-12-20 | Disposition: A | Discharge status: Home / Self Care | Payer: Managed Care, Other (non HMO) | Source: Ambulatory Visit | Attending: Physician Assistant | Admitting: Physician Assistant

## 2021-12-20 ENCOUNTER — Encounter (HOSPITAL_COMMUNITY): Payer: Self-pay

## 2021-12-20 DIAGNOSIS — C3491 Malignant neoplasm of unspecified part of right bronchus or lung: Secondary | ICD-10-CM | POA: Diagnosis present

## 2021-12-20 MED ORDER — SODIUM CHLORIDE (PF) 0.9 % IJ SOLN
INTRAMUSCULAR | Status: AC
  Filled 2021-12-20: qty 50

## 2021-12-20 MED ORDER — HEPARIN SOD (PORK) LOCK FLUSH 100 UNIT/ML IV SOLN
INTRAVENOUS | Status: AC
  Filled 2021-12-20: qty 5

## 2021-12-20 MED ORDER — HEPARIN SOD (PORK) LOCK FLUSH 100 UNIT/ML IV SOLN
500.0000 [IU] | Freq: Once | INTRAVENOUS | Status: AC
  Administered 2021-12-20: 500 [IU] via INTRAVENOUS

## 2021-12-20 MED ORDER — IOHEXOL 300 MG/ML  SOLN
100.0000 mL | Freq: Once | INTRAMUSCULAR | Status: AC | PRN
  Administered 2021-12-20: 100 mL via INTRAVENOUS

## 2021-12-23 ENCOUNTER — Other Ambulatory Visit: Payer: Self-pay

## 2021-12-28 ENCOUNTER — Encounter: Payer: Self-pay | Admitting: Internal Medicine

## 2021-12-28 ENCOUNTER — Other Ambulatory Visit: Payer: Self-pay

## 2021-12-28 ENCOUNTER — Inpatient Hospital Stay (HOSPITAL_BASED_OUTPATIENT_CLINIC_OR_DEPARTMENT_OTHER): Payer: Managed Care, Other (non HMO) | Admitting: Internal Medicine

## 2021-12-28 ENCOUNTER — Inpatient Hospital Stay: Payer: Managed Care, Other (non HMO)

## 2021-12-28 ENCOUNTER — Inpatient Hospital Stay: Payer: Managed Care, Other (non HMO) | Attending: Internal Medicine

## 2021-12-28 VITALS — BP 110/76 | HR 82 | Temp 98.6°F | Resp 16

## 2021-12-28 VITALS — BP 140/85 | HR 92 | Temp 97.6°F | Resp 16 | Ht 68.0 in | Wt 181.2 lb

## 2021-12-28 DIAGNOSIS — C3491 Malignant neoplasm of unspecified part of right bronchus or lung: Secondary | ICD-10-CM | POA: Diagnosis not present

## 2021-12-28 DIAGNOSIS — C3431 Malignant neoplasm of lower lobe, right bronchus or lung: Secondary | ICD-10-CM | POA: Diagnosis present

## 2021-12-28 DIAGNOSIS — I10 Essential (primary) hypertension: Secondary | ICD-10-CM | POA: Diagnosis not present

## 2021-12-28 DIAGNOSIS — F1010 Alcohol abuse, uncomplicated: Secondary | ICD-10-CM | POA: Insufficient documentation

## 2021-12-28 DIAGNOSIS — Z95828 Presence of other vascular implants and grafts: Secondary | ICD-10-CM

## 2021-12-28 DIAGNOSIS — C7931 Secondary malignant neoplasm of brain: Secondary | ICD-10-CM | POA: Diagnosis not present

## 2021-12-28 DIAGNOSIS — E785 Hyperlipidemia, unspecified: Secondary | ICD-10-CM | POA: Insufficient documentation

## 2021-12-28 DIAGNOSIS — I3139 Other pericardial effusion (noninflammatory): Secondary | ICD-10-CM | POA: Insufficient documentation

## 2021-12-28 DIAGNOSIS — Z79899 Other long term (current) drug therapy: Secondary | ICD-10-CM | POA: Diagnosis not present

## 2021-12-28 DIAGNOSIS — Z7951 Long term (current) use of inhaled steroids: Secondary | ICD-10-CM | POA: Diagnosis not present

## 2021-12-28 DIAGNOSIS — K219 Gastro-esophageal reflux disease without esophagitis: Secondary | ICD-10-CM | POA: Insufficient documentation

## 2021-12-28 DIAGNOSIS — E039 Hypothyroidism, unspecified: Secondary | ICD-10-CM | POA: Diagnosis not present

## 2021-12-28 DIAGNOSIS — K589 Irritable bowel syndrome without diarrhea: Secondary | ICD-10-CM | POA: Diagnosis not present

## 2021-12-28 DIAGNOSIS — Z5112 Encounter for antineoplastic immunotherapy: Secondary | ICD-10-CM | POA: Diagnosis not present

## 2021-12-28 DIAGNOSIS — Z79890 Hormone replacement therapy (postmenopausal): Secondary | ICD-10-CM | POA: Insufficient documentation

## 2021-12-28 DIAGNOSIS — Z7982 Long term (current) use of aspirin: Secondary | ICD-10-CM | POA: Diagnosis not present

## 2021-12-28 DIAGNOSIS — I700 Atherosclerosis of aorta: Secondary | ICD-10-CM | POA: Insufficient documentation

## 2021-12-28 LAB — CMP (CANCER CENTER ONLY)
ALT: 17 U/L (ref 0–44)
AST: 21 U/L (ref 15–41)
Albumin: 4.5 g/dL (ref 3.5–5.0)
Alkaline Phosphatase: 77 U/L (ref 38–126)
Anion gap: 5 (ref 5–15)
BUN: 11 mg/dL (ref 6–20)
CO2: 25 mmol/L (ref 22–32)
Calcium: 9.6 mg/dL (ref 8.9–10.3)
Chloride: 102 mmol/L (ref 98–111)
Creatinine: 1.03 mg/dL (ref 0.61–1.24)
GFR, Estimated: >60 mL/min (ref >60)
Glucose, Bld: 151 mg/dL — ABNORMAL HIGH (ref 70–99)
Potassium: 4.5 mmol/L (ref 3.5–5.1)
Sodium: 132 mmol/L — ABNORMAL LOW (ref 135–145)
Total Bilirubin: 0.8 mg/dL (ref 0.3–1.2)
Total Protein: 7.3 g/dL (ref 6.5–8.1)

## 2021-12-28 LAB — CBC WITH DIFFERENTIAL (CANCER CENTER ONLY)
Abs Immature Granulocytes: 0.03 10*3/uL (ref 0.00–0.07)
Basophils Absolute: 0.1 10*3/uL (ref 0.0–0.1)
Basophils Relative: 1 %
Eosinophils Absolute: 0.4 10*3/uL (ref 0.0–0.5)
Eosinophils Relative: 4 %
HCT: 43.5 % (ref 39.0–52.0)
Hemoglobin: 14.6 g/dL (ref 13.0–17.0)
Immature Granulocytes: 0 %
Lymphocytes Relative: 18 %
Lymphs Abs: 1.5 10*3/uL (ref 0.7–4.0)
MCH: 29.9 pg (ref 26.0–34.0)
MCHC: 33.6 g/dL (ref 30.0–36.0)
MCV: 89 fL (ref 80.0–100.0)
Monocytes Absolute: 0.8 10*3/uL (ref 0.1–1.0)
Monocytes Relative: 9 %
Neutro Abs: 5.8 10*3/uL (ref 1.7–7.7)
Neutrophils Relative %: 68 %
Platelet Count: 242 10*3/uL (ref 150–400)
RBC: 4.89 MIL/uL (ref 4.22–5.81)
RDW: 13 % (ref 11.5–15.5)
WBC Count: 8.5 10*3/uL (ref 4.0–10.5)
nRBC: 0 % (ref 0.0–0.2)

## 2021-12-28 LAB — TSH: TSH: 2.332 u[IU]/mL (ref 0.350–4.500)

## 2021-12-28 MED ORDER — SODIUM CHLORIDE 0.9% FLUSH
10.0000 mL | INTRAVENOUS | Status: DC | PRN
  Administered 2021-12-28: 10 mL

## 2021-12-28 MED ORDER — SODIUM CHLORIDE 0.9 % IV SOLN: Freq: Once | INTRAVENOUS | Status: AC

## 2021-12-28 MED ORDER — SODIUM CHLORIDE 0.9 % IV SOLN
200.0000 mg | Freq: Once | INTRAVENOUS | Status: AC
  Administered 2021-12-28: 200 mg via INTRAVENOUS
  Filled 2021-12-28: qty 200

## 2021-12-28 MED ORDER — HEPARIN SOD (PORK) LOCK FLUSH 100 UNIT/ML IV SOLN
500.0000 [IU] | Freq: Once | INTRAVENOUS | Status: AC | PRN
  Administered 2021-12-28: 500 [IU]

## 2021-12-28 NOTE — Patient Instructions (Signed)
[facility]COLOG[facility]  Discharge Instructions: Thank you for choosing [facility] to provide your oncology and hematology care.   If you have a lab appointment with the [facility], please go directly to the [facility] and check in at the registration area.   Wear comfortable clothing and clothing appropriate for easy access to any Portacath or PICC line.   We strive to give you quality time with your provider. You may need to reschedule your appointment if you arrive late (15 or more minutes).  Arriving late affects you and other patients whose appointments are after yours.  Also, if you miss three or more appointments without notifying the office, you may be dismissed from the clinic at the provider's discretion.      For prescription refill requests, have your pharmacy contact our office and allow 72 hours for refills to be completed.    Today you received the following chemotherapy and/or immunotherapy agents: Keytruda      To help prevent nausea and vomiting after your treatment, we encourage you to take your nausea medication as directed.  BELOW ARE SYMPTOMS THAT SHOULD BE REPORTED IMMEDIATELY: *FEVER GREATER THAN 100.4 F (38 C) OR HIGHER *CHILLS OR SWEATING *NAUSEA AND VOMITING THAT IS NOT CONTROLLED WITH YOUR NAUSEA MEDICATION *UNUSUAL SHORTNESS OF BREATH *UNUSUAL BRUISING OR BLEEDING *URINARY PROBLEMS (pain or burning when urinating, or frequent urination) *BOWEL PROBLEMS (unusual diarrhea, constipation, pain near the anus) TENDERNESS IN MOUTH AND THROAT WITH OR WITHOUT PRESENCE OF ULCERS (sore throat, sores in mouth, or a toothache) UNUSUAL RASH, SWELLING OR PAIN  UNUSUAL VAGINAL DISCHARGE OR ITCHING   Items with * indicate a potential emergency and should be followed up as soon as possible or go to the Emergency Department if any problems should occur.  Please show the CHEMOTHERAPY ALERT CARD or IMMUNOTHERAPY ALERT CARD at check-in to  the Emergency Department and triage nurse.  Should you have questions after your visit or need to cancel or reschedule your appointment, please contact [facility]  Dept: [phone]  and follow the prompts.  Office hours are 8:00 a.m. to 4:30 p.m. Monday - Friday. Please note that voicemails left after 4:00 p.m. may not be returned until the following business day.  We are closed weekends and major holidays. You have access to a nurse at all times for urgent questions. Please call the main number to the clinic Dept: [phone] and follow the prompts.   For any non-urgent questions, you may also contact your provider using MyChart. We now offer e-Visits for anyone 43 and older to request care online for non-urgent symptoms. For details visit [email or web address].   Also download the MyChart app! Go to the app store, search "MyChart", open the app, select [facility], and log in with your MyChart username and password.  Masks are optional in the cancer centers. If you would like for your care team to wear a mask while they are taking care of you, please let them know. You may have one support person who is at least 60 years old accompany you for your appointments.

## 2021-12-28 NOTE — Progress Notes (Signed)
[place] [phone]   [phone]  OFFICE PROGRESS NOTE  [name], NP [address] [name] [address] [place] [address]  DIAGNOSIS: Stage IV (T3, N0, M1C) non-small cell lung cancer, adenocarcinoma.  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 He was diagnosed in July 2021.   Molecular Biomarkers:  MSI-High DETECTED Pembrolizumab Atezolizumab, Avelumab, Cemiplimab, Dostarlimab, Durvalumab, Ipilimumab, Nivolumab   STK11Splice Site SNV 3.2% Everolimus, Temsirolimus Yes   KRASG12D 1.7% Binimetinib Yes   [id number] 0.4%   Niraparib, Olaparib, Rucaparib, Talazoparib, Tazemetostat Yes   PRIOR THERAPY:  1) SRS to the solitary brain metastasis under the care of Dr. Lisbeth Renshaw. Last treatment 11/14/19. 2) Weekly concurrent chemoradiation with carboplatin for an AUC of 2, paclitaxel 45 mg/m2.  First dose expected on 11/25/2019. Status post 7 cycles, last dose was given 01/06/2020 with partial response.    CURRENT THERAPY:  1)  Immunotherapy with Keytruda 200 mg IV every 3 weeks.  First dose February 10, 2020 for a patient with MSI high.  Status post 30  cycles. 2) Avastin 15 mg/KG every 3 weeks.  First dose today for the vasogenic edema of the brain.S/P 7 cycles.  INTERVAL HISTORY: OSSIEL MARCHIO 61 y.o. male returns to the clinic today for follow-up visit accompanied by his wife.  The patient is feeling fine today with no concerning complaints except for the skin lesion that is likely secondary to immunotherapy mediated from treatment with Keytruda.  He was seen by dermatology and biopsy was done and there was no cancer identified.  He is currently being treated with Kenalog cream as well as antihistamine.  He denied having any current chest pain, shortness of breath except with exertion with no cough or hemoptysis.  He has no nausea, vomiting, diarrhea or  constipation.  He has no headache or visual changes.  He intentionally lost 3 pounds in the interval.  He had repeat CT scan of the chest, abdomen and pelvis performed recently and he is here for evaluation and discussion of his scan results before starting cycle #31.  MEDICAL HISTORY: Past Medical History:  Diagnosis Date   Cancer ([place])    lung cancer   Diverticulosis    GERD (gastroesophageal reflux disease)    Hx of small bowel obstruction    Hyperlipidemia    Hypertension    Hypothyroidism    IBS (irritable bowel syndrome)    Substance abuse ([place])    Alcoholic, Drug addition   Thyroid disease     ALLERGIES:  is allergic to penicillins.  MEDICATIONS:  Current Outpatient Medications  Medication Sig Dispense Refill   albuterol (PROVENTIL HFA;VENTOLIN HFA) 108 (90 Base) MCG/ACT inhaler Inhale 2 puffs into the lungs every 6 (six) hours as needed for wheezing or shortness of breath. 1 Inhaler 0   amLODipine (NORVASC) 5 MG tablet TAKE 1 TABLET (5 MG TOTAL) BY MOUTH DAILY. 90 tablet 1   ANORO ELLIPTA 62.5-25 MCG/INH AEPB 1 puff daily.     aspirin EC 81 MG tablet Take 1 tablet (81 mg total) by mouth daily. Swallow whole. 30 tablet 11   augmented betamethasone dipropionate (DIPROLENE-AF) 0.05 % cream Apply topically 2 (two) times daily.     cyclobenzaprine (FLEXERIL) 10 MG tablet Take 10 mg by mouth 2 (two) times daily as needed for muscle spasms.     docusate sodium (COLACE) 100 MG capsule Take 100  mg by mouth daily.     DULoxetine (CYMBALTA) 20 MG capsule Take 1 capsule (20 mg total) by mouth 2 (two) times daily. 60 capsule 5   fluticasone furoate-vilanterol (BREO ELLIPTA) 100-25 MCG/INH AEPB Inhale 1 puff into the lungs daily. 30 each 3   hydrOXYzine (ATARAX) 25 MG tablet Take 25 mg by mouth at bedtime.     levothyroxine (SYNTHROID) 100 MCG tablet TAKE 1 TABLET BY MOUTH EVERY DAY 90 tablet 1   lidocaine-prilocaine (EMLA) cream Apply 1 application topically as needed. 30 g 0    omeprazole (PRILOSEC) 40 MG capsule Take 1 capsule (40 mg total) by mouth daily. 90 capsule 4   ondansetron (ZOFRAN) 4 MG tablet Take 1 tablet (4 mg total) by mouth every 8 (eight) hours as needed. 40 tablet 2   oxyCODONE-acetaminophen (PERCOCET/ROXICET) 5-325 MG tablet Take 1 tablet by mouth every 4 (four) hours as needed for severe pain. 30 tablet 0   pembrolizumab (KEYTRUDA) 100 MG/4ML SOLN See admin instructions.     polyethylene glycol powder (MIRALAX) powder Take 17 g by mouth daily. 255 g 11   prochlorperazine (COMPAZINE) 10 MG tablet Take 1 tablet (10 mg total) by mouth every 6 (six) hours as needed. 30 tablet 2   triamcinolone cream (KENALOG) 0.1 % APPLY TOPICALLY 2 TIMES DAILY AS NEEDED. 454 g 0   TRULANCE 3 MG TABS Take 1 tablet by mouth daily.     zolpidem (AMBIEN CR) 12.5 MG CR tablet Take 1 tablet (12.5 mg total) by mouth at bedtime as needed for sleep. 30 tablet 5   No current facility-administered medications for this visit.    SURGICAL HISTORY:  Past Surgical History:  Procedure Laterality Date   arm surgery Right    BRONCHIAL BRUSHINGS  10/24/2019   Procedure: BRONCHIAL BRUSHINGS;  Surgeon: Collene Gobble, MD;  Location: [facility] ENDOSCOPY;  Service: Cardiopulmonary;;  right lower lobe    BRONCHIAL BRUSHINGS  11/05/2019   Procedure: BRONCHIAL BRUSHINGS;  Surgeon: Collene Gobble, MD;  Location: [facility] ENDOSCOPY;  Service: Pulmonary;;   BRONCHIAL NEEDLE ASPIRATION BIOPSY  10/24/2019   Procedure: BRONCHIAL NEEDLE ASPIRATION BIOPSIES;  Surgeon: Collene Gobble, MD;  Location: [place];  Service: Cardiopulmonary;;   BRONCHIAL NEEDLE ASPIRATION BIOPSY  11/05/2019   Procedure: BRONCHIAL NEEDLE ASPIRATION BIOPSIES;  Surgeon: Collene Gobble, MD;  Location: [facility] ENDOSCOPY;  Service: Pulmonary;;   ENDOBRONCHIAL ULTRASOUND N/A 10/24/2019   Procedure: ENDOBRONCHIAL ULTRASOUND;  Surgeon: Collene Gobble, MD;  Location: [place];  Service: Cardiopulmonary;  Laterality: N/A;   FINGER SURGERY  Right    Middle   IR IMAGING GUIDED PORT INSERTION  11/19/2019   VIDEO BRONCHOSCOPY N/A 10/24/2019   Procedure: VIDEO BRONCHOSCOPY WITHOUT FLUORO;  Surgeon: Collene Gobble, MD;  Location: [place];  Service: Cardiopulmonary;  Laterality: N/A;   VIDEO BRONCHOSCOPY WITH ENDOBRONCHIAL NAVIGATION N/A 11/05/2019   Procedure: VIDEO BRONCHOSCOPY WITH ENDOBRONCHIAL NAVIGATION;  Surgeon: Collene Gobble, MD;  Location: [place] ENDOSCOPY;  Service: Pulmonary;  Laterality: N/A;    REVIEW OF SYSTEMS:  Constitutional: positive for weight loss Eyes: negative Ears, nose, mouth, throat, and face: negative Respiratory: negative Cardiovascular: negative Gastrointestinal: negative Genitourinary:negative Integument/breast: positive for skin lesion(s) Hematologic/lymphatic: negative Musculoskeletal:negative Neurological: negative Behavioral/Psych: negative Endocrine: negative Allergic/Immunologic: negative   PHYSICAL EXAMINATION: General appearance: alert, cooperative, fatigued, and no distress Head: Normocephalic, without obvious abnormality, atraumatic Neck: no adenopathy, no JVD, supple, symmetrical, trachea midline, and thyroid not enlarged, symmetric, no tenderness/mass/nodules Lymph nodes: Cervical, supraclavicular, and axillary  nodes normal. Resp: clear to auscultation bilaterally Back: symmetric, no curvature. ROM normal. No CVA tenderness. Cardio: regular rate and rhythm, S1, S2 normal, no murmur, click, rub or gallop GI: soft, non-tender; bowel sounds normal; no masses,  no organomegaly Extremities: extremities normal, atraumatic, no cyanosis or edema Neurologic: Alert and oriented X 3, normal strength and tone. Normal symmetric reflexes. Normal coordination and gait  ECOG PERFORMANCE STATUS: 1 - Symptomatic but completely ambulatory  Blood pressure (!) 140/85, pulse 92, temperature 97.6 F (36.4 C), temperature source Oral, resp. rate 16, height $RemoveBe'5\' 8"'otjaJEepE$  (1.727 m), weight 181 lb 3.2 oz (82.2  kg), SpO2 99 %.  LABORATORY DATA: Lab Results  Component Value Date   WBC 8.5 12/28/2021   HGB 14.6 12/28/2021   HCT 43.5 12/28/2021   MCV 89.0 12/28/2021   PLT 242 12/28/2021      Chemistry      Component Value Date/Time   NA 136 12/08/2021 0740   NA 141 03/08/2017 1707   K 3.8 12/08/2021 0740   CL 105 12/08/2021 0740   CO2 25 12/08/2021 0740   BUN 10 12/08/2021 0740   BUN 12 03/08/2017 1707   CREATININE 0.96 12/08/2021 0740      Component Value Date/Time   CALCIUM 9.4 12/08/2021 0740   ALKPHOS 66 12/08/2021 0740   AST 25 12/08/2021 0740   ALT 20 12/08/2021 0740   BILITOT 0.6 12/08/2021 0740       RADIOGRAPHIC STUDIES: CT CHEST ABDOMEN PELVIS W CONTRAST  Result Date: 12/21/2021 CLINICAL DATA:  Primary Cancer Type: Lung Imaging Indication: Assess response to therapy Interval therapy since last imaging? Yes Initial Cancer Diagnosis Date: 11/05/2019; Established by: Biopsy-proven Detailed Pathology: Stage IV non-small cell lung cancer, adenocarcinoma. Primary Tumor location: Right lower lobe. Solitary brain metastasis. Surgeries: No. Chemotherapy: Yes; Ongoing? No; Most recent administration: 01/06/2020 Immunotherapy?  Yes; Type: Keytruda; Ongoing? Yes Radiation therapy? Yes Date Range: 03/05/2020; Target: Brain metastasis Date Range: 11/25/2019 - 01/09/2020; Target: Right lung Date Range: 11/14/2019; Target: Cerebellar metastasis * Tracking Code: BO * EXAM: CT CHEST, ABDOMEN, AND PELVIS WITH CONTRAST TECHNIQUE: Multidetector CT imaging of the chest, abdomen and pelvis was performed following the standard protocol during bolus administration of intravenous contrast. RADIATION DOSE REDUCTION: This exam was performed according to the departmental dose-optimization program which includes automated exposure control, adjustment of the mA and/or kV according to patient size and/or use of iterative reconstruction technique. CONTRAST:  169mL OMNIPAQUE IOHEXOL 300 MG/ML  SOLN COMPARISON:   Most recent CT chest, abdomen and pelvis 09/09/2021. 11/13/2019 PET-CT. FINDINGS: CT CHEST FINDINGS Cardiovascular: RIGHT-sided Port-A-Cath terminating at the caval to atrial junction. Small pericardial effusion is similar to imaging from May of 2023 and without signs of nodularity. Aortic caliber is normal. Calcified and noncalcified aortic atherosclerosis. Central pulmonary vasculature is normal caliber. Mediastinum/Nodes: Mild circumferential diffuse esophageal thickening may be related to prior radiotherapy in is unchanged. No adenopathy in the chest. Lungs/Pleura: RIGHT perihilar volume loss, bronchiectasis and soft tissue following prior radiotherapy is a stable finding. No signs of pleural effusion. Airways are patent. Referenced bronchial wall thickening in the chest is very mild and unchanged. Musculoskeletal: See below for full musculoskeletal details. CT ABDOMEN PELVIS FINDINGS Hepatobiliary: Stable small hypodensity likely a small hemangioma in the lateral segment of the LEFT hepatic lobe measuring 13 mm (image 56/2) no focal, suspicious hepatic lesion. No pericholecystic stranding. No sign of biliary duct dilation. The portal vein is patent. Pancreas: Normal, without mass, inflammation or ductal dilatation. Spleen:  Normal. Adrenals/Urinary Tract: Adrenal glands are normal. Smooth renal contours. No hydronephrosis. No suspicious renal lesion. No perivesical stranding. Stomach/Bowel: No acute gastrointestinal findings.  Normal appendix. Vascular/Lymphatic: Moderate calcific and noncalcific atheromatous plaque of the abdominal aorta. Chronic occlusion of LEFT iliac vasculature, reconstituted in the distal external iliac and proximal femoral vasculature. These findings are unchanged. Retroaortic LEFT renal vein. There is no gastrohepatic or hepatoduodenal ligament lymphadenopathy. No retroperitoneal or mesenteric lymphadenopathy. No pelvic sidewall lymphadenopathy. Reproductive: Unremarkable by CT. Other:  No ascites Musculoskeletal: No acute bone finding. No destructive bone process. Spinal degenerative changes. IMPRESSION: 1. Stable RIGHT perihilar volume loss, bronchiectasis and soft tissue following prior radiotherapy. 2. No new or progressive findings. 3. Stable small pericardial effusion. 4. Changes of chronic vascular disease with occlusion of the LEFT iliac artery not changed from previous imaging. 5. Aortic atherosclerosis. Aortic Atherosclerosis (ICD10-I70.0). Electronically Signed   By: Zetta Bills M.D.   On: 12/21/2021 11:41     ASSESSMENT AND PLAN: This is a very pleasant 61 years old white male with stage IV (T3, N0, M1c) non-small cell lung cancer, adenocarcinoma with MSI high presented with right lower lobe/infrahilar mass in addition to solitary brain metastasis in the left cerebellum diagnosed in July 2021. He is status post SRS to the solitary brain metastasis. The patient completed a course of concurrent chemoradiation with weekly carboplatin and paclitaxel.  He tolerated the treatment well except for fatigue and mild odynophagia. The patient has MSI high and I recommended for him treatment with immunotherapy with single agent Keytruda 200 mg IV every 3 weeks for a total of 2 years unless the patient has unacceptable toxicity or disease progression. He is status post 30  cycles of treatment with Keytruda.  He also received 7 cycles of Avastin for the vasogenic edema in the brain.   Avastin will be on hold for now unless needed in the future. He has been tolerating his treatment with Keytruda fairly well with no concerning adverse effects except for the skin lesions. He had repeat CT scan of the chest, abdomen and pelvis performed recently.  I personally and independently reviewed the scan and discussed the result with the patient and his wife. His scan showed no concerning findings for disease progression. I recommended for him to continue his current treatment with Keytruda with the  same dose. I will see him back for follow-up visit in 3 weeks for evaluation before the next cycle of his treatment. For the skin lesions, he is currently on Kenalog and was seen by dermatology. The patient was advised to call immediately if he has any other concerning symptoms in the interval. The patient voices understanding of current disease status and treatment options and is in agreement with the current care plan.  All questions were answered. The patient knows to call the clinic with any problems, questions or concerns. We can certainly see the patient much sooner if necessary.  Disclaimer: This note was dictated with voice recognition software. Similar sounding words can inadvertently be transcribed and may not be corrected upon review.

## 2021-12-29 ENCOUNTER — Other Ambulatory Visit: Payer: Self-pay | Admitting: Internal Medicine

## 2021-12-29 DIAGNOSIS — L299 Pruritus, unspecified: Secondary | ICD-10-CM

## 2021-12-30 ENCOUNTER — Encounter: Payer: Self-pay | Admitting: Internal Medicine

## 2021-12-30 ENCOUNTER — Encounter: Payer: Self-pay | Admitting: Physician Assistant

## 2021-12-31 ENCOUNTER — Other Ambulatory Visit: Payer: Self-pay

## 2021-12-31 DIAGNOSIS — C7931 Secondary malignant neoplasm of brain: Secondary | ICD-10-CM

## 2022-01-03 ENCOUNTER — Other Ambulatory Visit: Payer: Self-pay | Admitting: Internal Medicine

## 2022-01-07 ENCOUNTER — Ambulatory Visit
Admission: RE | Admit: 2022-01-07 | Discharge: 2022-01-07 | Disposition: A | Discharge status: Home / Self Care | Payer: Managed Care, Other (non HMO) | Source: Ambulatory Visit | Attending: Internal Medicine

## 2022-01-07 DIAGNOSIS — C3490 Malignant neoplasm of unspecified part of unspecified bronchus or lung: Secondary | ICD-10-CM

## 2022-01-07 DIAGNOSIS — C7931 Secondary malignant neoplasm of brain: Secondary | ICD-10-CM

## 2022-01-07 MED ORDER — GADOBENATE DIMEGLUMINE 529 MG/ML IV SOLN
17.0000 mL | Freq: Once | INTRAVENOUS | Status: AC | PRN
  Administered 2022-01-07: 17 mL via INTRAVENOUS

## 2022-01-07 MED ORDER — HEPARIN SOD (PORK) LOCK FLUSH 100 UNIT/ML IV SOLN
500.0000 [IU] | Freq: Once | INTRAVENOUS | Status: AC
  Administered 2022-01-07: 500 [IU] via INTRAVENOUS

## 2022-01-07 MED ORDER — SODIUM CHLORIDE 0.9% FLUSH
10.0000 mL | INTRAVENOUS | Status: DC | PRN
  Administered 2022-01-07: 10 mL via INTRAVENOUS

## 2022-01-10 ENCOUNTER — Inpatient Hospital Stay: Payer: Managed Care, Other (non HMO)

## 2022-01-11 ENCOUNTER — Other Ambulatory Visit: Payer: Self-pay

## 2022-01-11 ENCOUNTER — Inpatient Hospital Stay (HOSPITAL_BASED_OUTPATIENT_CLINIC_OR_DEPARTMENT_OTHER): Payer: Managed Care, Other (non HMO) | Admitting: Internal Medicine

## 2022-01-11 VITALS — BP 126/86 | HR 99 | Temp 97.5°F | Resp 16 | Wt 181.4 lb

## 2022-01-11 DIAGNOSIS — C7931 Secondary malignant neoplasm of brain: Secondary | ICD-10-CM | POA: Diagnosis not present

## 2022-01-11 DIAGNOSIS — C3431 Malignant neoplasm of lower lobe, right bronchus or lung: Secondary | ICD-10-CM | POA: Diagnosis not present

## 2022-01-11 NOTE — Progress Notes (Signed)
[facility] [place], [place] [address] [phone]   Interval Evaluation  Date of Service: 01/11/22 Patient Name: Joseph Atkins Patient MRN: [id number] Patient DOB: 1960/12/02 Provider: Ventura Sellers, MD  Identifying Statement:  Joseph Atkins is a 61 y.o. male with Malignant neoplasm metastatic to brain [facility]) [C79.31]   Primary Cancer:  Oncologic History: Oncology History  Adenocarcinoma of right lung, stage 4 ([place])  11/14/2019 Initial Diagnosis   Adenocarcinoma of right lung, stage 4 ([place])   11/25/2019 - 01/06/2020 Chemotherapy   The patient had palonosetron (ALOXI) injection 0.25 mg, 0.25 mg, Intravenous,  Once, 7 of 7 cycles Administration: 0.25 mg (11/25/2019), 0.25 mg (12/23/2019), 0.25 mg (12/31/2019), 0.25 mg (12/02/2019), 0.25 mg (01/06/2020), 0.25 mg (12/09/2019), 0.25 mg (12/16/2019) CARBOplatin (PARAPLATIN) 250 mg in sodium chloride 0.9 % 250 mL chemo infusion, 249.8 mg (100 % of original dose 249.8 mg), Intravenous,  Once, 7 of 7 cycles Dose modification: 249.8 mg (original dose 249.8 mg, Cycle 1) Administration: 250 mg (11/25/2019), 250 mg (12/23/2019), 250 mg (12/31/2019), 250 mg (12/02/2019), 250 mg (01/06/2020), 270 mg (12/09/2019), 250 mg (12/16/2019) PACLitaxel (TAXOL) 90 mg in sodium chloride 0.9 % 250 mL chemo infusion (</= 80mg /m2), 45 mg/m2 = 90 mg, Intravenous,  Once, 7 of 7 cycles Administration: 90 mg (11/25/2019), 90 mg (12/23/2019), 90 mg (12/31/2019), 90 mg (12/02/2019), 90 mg (01/06/2020), 90 mg (12/09/2019), 90 mg (12/16/2019)  for chemotherapy treatment.    02/18/2020 -  Chemotherapy   Patient is on Treatment Plan : LUNG NSCLC Pembrolizumab (200) q21d     03/31/2020 - 07/21/2020 Chemotherapy    Patient is on Treatment Plan: LUNG NSCLC FLAT DOSE PEMBROLIZUMAB Q21D      05/19/2020 Cancer Staging   Staging form: Lung, AJCC 8th Edition - Clinical: Stage IVB (cT3, cN0, cM1c) - Signed by Curt Bears, MD on  05/19/2020    CNS Oncologic History 11/14/19: Completes single frx SRS to 2.4cm cerebellar metastasis 03/05/20: SRS to 7 targets [facility]) 06/19/20: SRS to 6 additional targets Lisbeth Renshaw)    Interval History:  Joseph Atkins presents today following recent MRI brain.  No new or progressive changes from neurologic standpoint.  He does complain of ongoing fatigue and low energy, not worse from prior. No recurrence of headaches. Continues on [place] therapy with Dr. Julien Nordmann.  H+P (01/24/20) Patient presents today to discuss recent recurrence of neurologic symptoms following radiosurgery in July 2021.  He describes poor balance, wide based walking, needing to hold on to family or objects to walk safely.  He also describes nausea and dizzy-headed feeling at times.  Overall he is functioning in an impaired and sluggish way.  Symptoms have become noticeable since decreasing decadron to less than 4mg  daily; currently he is dosing 2mg  daily.  He felt "quite good" immediately after radiation and during higher dose steroid therapy.  No complications from decadron that he can report.  Recently completed chemoradioatherapy induction for lung adenocarcinoma with Dr. Julien Nordmann.  Medications: Current Outpatient Medications on File Prior to Visit  Medication Sig Dispense Refill   albuterol (PROVENTIL HFA;VENTOLIN HFA) 108 (90 Base) MCG/ACT inhaler Inhale 2 puffs into the lungs every 6 (six) hours as needed for wheezing or shortness of breath. 1 Inhaler 0   amLODipine (NORVASC) 5 MG tablet TAKE 1 TABLET (5 MG TOTAL) BY MOUTH DAILY. 90 tablet 1   ANORO ELLIPTA 62.5-25 MCG/INH AEPB 1 puff daily.     augmented betamethasone dipropionate (DIPROLENE-AF)  0.05 % cream Apply topically 2 (two) times daily.     [facility] ASPIRIN LOW DOSE 81 MG tablet TAKE 1 TABLET (81 MG TOTAL) BY MOUTH DAILY. SWALLOW WHOLE. 90 tablet 3   cyclobenzaprine (FLEXERIL) 10 MG tablet Take 10 mg by mouth 2 (two) times daily as needed for muscle spasms.      docusate sodium (COLACE) 100 MG capsule Take 100 mg by mouth daily.     DULoxetine (CYMBALTA) 20 MG capsule Take 1 capsule (20 mg total) by mouth 2 (two) times daily. 60 capsule 5   fluticasone furoate-vilanterol (BREO ELLIPTA) 100-25 MCG/INH AEPB Inhale 1 puff into the lungs daily. 30 each 3   hydrOXYzine (ATARAX) 25 MG tablet Take 25 mg by mouth at bedtime.     levothyroxine (SYNTHROID) 100 MCG tablet TAKE 1 TABLET BY MOUTH EVERY DAY 90 tablet 1   lidocaine-prilocaine (EMLA) cream Apply 1 application topically as needed. 30 g 0   omeprazole (PRILOSEC) 40 MG capsule Take 1 capsule (40 mg total) by mouth daily. 90 capsule 4   ondansetron (ZOFRAN) 4 MG tablet Take 1 tablet (4 mg total) by mouth every 8 (eight) hours as needed. 40 tablet 2   oxyCODONE-acetaminophen (PERCOCET/ROXICET) 5-325 MG tablet Take 1 tablet by mouth every 4 (four) hours as needed for severe pain. 30 tablet 0   pembrolizumab (KEYTRUDA) 100 MG/4ML SOLN See admin instructions.     polyethylene glycol powder (MIRALAX) powder Take 17 g by mouth daily. 255 g 11   prochlorperazine (COMPAZINE) 10 MG tablet Take 1 tablet (10 mg total) by mouth every 6 (six) hours as needed. 30 tablet 2   triamcinolone cream (KENALOG) 0.1 % APPLY TOPICALLY 2 TIMES DAILY AS NEEDED. 454 g 0   TRULANCE 3 MG TABS Take 1 tablet by mouth daily.     zolpidem (AMBIEN CR) 12.5 MG CR tablet Take 1 tablet (12.5 mg total) by mouth at bedtime as needed for sleep. 30 tablet 5   No current facility-administered medications on file prior to visit.    Allergies:  Allergies  Allergen Reactions   Penicillins Other (See Comments)    Childhood allergy.  Has patient had a PCN reaction causing immediate rash, facial/tongue/throat swelling, SOB or lightheadedness with hypotension: unknown Has patient had a PCN reaction causing severe rash involving mucus membranes or skin necrosis: unknown Has patient had a PCN reaction that required hospitalization: unknown Has  patient had a PCN reaction occurring within the last 10 years: no If all of the above answers are "NO", then may proceed with Cephalosporin use.    Past Medical History:  Past Medical History:  Diagnosis Date   Cancer ([place])    lung cancer   Diverticulosis    GERD (gastroesophageal reflux disease)    Hx of small bowel obstruction    Hyperlipidemia    Hypertension    Hypothyroidism    IBS (irritable bowel syndrome)    Substance abuse ([facility])    Alcoholic, Drug addition   Thyroid disease    Past Surgical History:  Past Surgical History:  Procedure Laterality Date   arm surgery Right    BRONCHIAL BRUSHINGS  10/24/2019   Procedure: BRONCHIAL BRUSHINGS;  Surgeon: Collene Gobble, MD;  Location: [place];  Service: Cardiopulmonary;;  right lower lobe    BRONCHIAL BRUSHINGS  11/05/2019   Procedure: BRONCHIAL BRUSHINGS;  Surgeon: Collene Gobble, MD;  Location: [place];  Service: Pulmonary;;   BRONCHIAL NEEDLE ASPIRATION BIOPSY  10/24/2019   Procedure:  BRONCHIAL NEEDLE ASPIRATION BIOPSIES;  Surgeon: Collene Gobble, MD;  Location: [place];  Service: Cardiopulmonary;;   BRONCHIAL NEEDLE ASPIRATION BIOPSY  11/05/2019   Procedure: BRONCHIAL NEEDLE ASPIRATION BIOPSIES;  Surgeon: Collene Gobble, MD;  [facility] END[facility]COPY;  Service: Pulmonary;;   ENDOBRONCHIAL ULTRASOUND N/A 10/24/2019   Procedure: ENDOBRONCHIAL ULTRASOUND;  Surgeon: Collene Gobble, MD;  Location: [place];  Service: Cardiopulmonary;  Laterality: N/A;   FINGER SURGERY Right    Middle   IR IMAGING GUIDED PORT INSERTION  11/19/2019   VIDEO BRONCHOSCOPY N/A 10/24/2019   Procedure: VIDEO BRONCHOSCOPY WITHOUT FLUORO;  Surgeon: Collene Gobble, MD;  Location: [place];  Service: Cardiopulmonary;  Laterality: N/A;   VIDEO BRONCHOSCOPY WITH ENDOBRONCHIAL NAVIGATION N/A 11/05/2019   Procedure: VIDEO BRONCHOSCOPY WITH ENDOBRONCHIAL NAVIGATION;  Surgeon: Collene Gobble, MD;  Location: [place] ENDOSCOPY;  Service: Pulmonary;   Laterality: N/A;   Social History:  Social History   Socioeconomic History   Marital status: Married    Spouse name: Not on file   Number of children: 2   Years of education: Not on file   Highest education level: Not on file  Occupational History   Occupation: supervisor    Employer: [employer]  Tobacco Use   Smoking status: Former    Packs/day: 1.50    Types: Cigarettes   Smokeless tobacco: Former    Types: Nurse, children's Use: Never used  Substance and Sexual Activity   Alcohol use: No    Comment: Alcoholic clean for 6 years   Drug use: No    Comment: Recovering Drug Addict-clean for 6 years   Sexual activity: Not on file  Other Topics Concern   Not on file  Social History Narrative   Not on file   Social Determinants of Health   Financial Resource Strain: Medium Risk (10/31/2019)   Overall Financial Resource Strain (CARDIA)    Difficulty of Paying Living Expenses: Somewhat hard  Food Insecurity: No Food Insecurity (10/31/2019)   Hunger Vital Sign    Worried About Running Out of Food in the Last Year: Never true    Ran Out of Food in the Last Year: Never true  Transportation Needs: No Transportation Needs (10/31/2019)   PRAPARE - Hydrologist (Medical): No    Lack of Transportation (Non-Medical): No  Physical Activity: Insufficiently Active (10/31/2019)   Exercise Vital Sign    Days of Exercise per Week: 5 days    Minutes of Exercise per Session: 20 min  Stress: Stress Concern Present (10/31/2019)   [place]    Feeling of Stress : Very much  Social Connections: Moderately Isolated (10/31/2019)   Social Connection and Isolation Panel [NHANES]    Frequency of Communication with Friends and Family: More than three times a week    Frequency of Social Gatherings with Friends and Family: More than three times a week    Attends Religious Services: Never    Building surveyor or Organizations: No    Attends Archivist Meetings: Never    Marital Status: Married  Human resources officer Violence: Not At Risk (10/31/2019)   Humiliation, Afraid, Rape, and Kick questionnaire    Fear of Current or Ex-Partner: No    Emotionally Abused: No    Physically Abused: No    Sexually Abused: No   Family History:  Family History  Problem Relation Age of Onset  Epilepsy Mother    Heart disease Mother    Heart disease Brother    Lung cancer Paternal Uncle     Review of Systems: Constitutional: Doesn't report fevers, chills or abnormal weight loss Eyes: Doesn't report blurriness of vision Ears, nose, mouth, throat, and face: Doesn't report sore throat Respiratory: Doesn't report cough, dyspnea or wheezes Cardiovascular: Doesn't report palpitation, chest discomfort  Gastrointestinal:  Doesn't report nausea, constipation, diarrhea GU: Doesn't report incontinence Skin: Doesn't report skin rashes Neurological: Per HPI Musculoskeletal: Doesn't report joint pain Behavioral/Psych: Doesn't report anxiety  Physical Exam: Vitals:   01/11/22 1118  BP: 126/86  Pulse: 99  Resp: 16  Temp: (!) 97.5 F (36.4 C)  SpO2: 100%    KPS: 70. General: Alert, cooperative, pleasant, in no acute distress Head: Normal EENT: No conjunctival injection or scleral icterus.  Lungs: Resp effort normal Cardiac: Regular rate Abdomen: Non-distended abdomen Skin: No rashes cyanosis or petechiae. Extremities: No clubbing or edema  Neurologic Exam: Mental Status: Awake, alert, attentive to examiner. Oriented to self and environment. Language is fluent with intact comprehension.  Cranial Nerves: Visual acuity is grossly normal. Visual fields are full. Extra-ocular movements intact. No ptosis. Face is symmetric Motor: Tone and bulk are normal. Power is full in both arms and legs. Reflexes are symmetric, no pathologic reflexes present.  Dysmetria L>R Sensory: Intact to light  touch Gait: Dystaxic, wide based   Labs: I have reviewed the data as listed    Component Value Date/Time   NA 132 (L) 12/28/2021 0907   NA 141 03/08/2017 1707   K 4.5 12/28/2021 0907   CL 102 12/28/2021 0907   CO2 25 12/28/2021 0907   GLUCOSE 151 (H) 12/28/2021 0907   BUN 11 12/28/2021 0907   BUN 12 03/08/2017 1707   CREATININE 1.03 12/28/2021 0907   CALCIUM 9.6 12/28/2021 0907   PROT 7.3 12/28/2021 0907   PROT 7.1 03/08/2017 1707   ALBUMIN 4.5 12/28/2021 0907   ALBUMIN 4.5 03/08/2017 1707   AST 21 12/28/2021 0907   ALT 17 12/28/2021 0907   ALKPHOS 77 12/28/2021 0907   BILITOT 0.8 12/28/2021 0907   GFRNONAA >60 12/28/2021 0907   GFRAA >60 01/06/2020 0837   Lab Results  Component Value Date   WBC 8.5 12/28/2021   NEUTROABS 5.8 12/28/2021   HGB 14.6 12/28/2021   HCT 43.5 12/28/2021   MCV 89.0 12/28/2021   PLT 242 12/28/2021    Imaging:  [facility] Clinician Interpretation: I have personally reviewed the CNS images as listed.  My interpretation, in the context of the patient's clinical presentation, is stable disease  MR BRAIN W WO CONTRAST  Result Date: 01/08/2022 CLINICAL DATA:  61 year old male with a history of metastatic lung cancer. Status post SRS in 2021 and 2022. Restaging. EXAM: MRI HEAD WITHOUT AND WITH CONTRAST TECHNIQUE: Multiplanar, multiecho pulse sequences of the brain and surrounding structures were obtained without and with intravenous contrast. CONTRAST:  14mL MULTIHANCE GADOBENATE DIMEGLUMINE 529 MG/ML IV SOLN COMPARISON:  Brain MRI 09/09/2021 and earlier. FINDINGS: Brain: Chronic enhancing left cerebellar lesion appears stable since May, and may have contracted slightly along its left lateral margin since February (series 14, image 33 now versus series 12, image 31 then). Associated chronic hemosiderin but only mild intrinsic T1 hyperintensity. Approximately half a dozen additional small enhancing foci also appears stable since February, including those  clustered in the superior left occipital lobe series 13, image 85, and the 2nd largest lesion in the superior left perirolandic  cortex (series 13, image 134). These are annotated on series 11 today. Associated T2 and FLAIR hyperintensity in the left cerebellum has not significantly changed. No significant posterior fossa mass effect. No other areas suspicious for vasogenic edema. No acute infarct identified today. No midline shift, mass effect, ventriculomegaly, extra-axial collection or acute intracranial hemorrhage. Cervicomedullary junction and pituitary are within normal limits. No dural thickening identified. Vascular: Major intracranial vascular flow voids are stable. Skull and upper cervical spine: Negative visible cervical spine. Visualized bone marrow signal is within normal limits. Sinuses/Orbits: Stable.  Right maxillary sinus retention cyst. Other: Mastoids are clear. Visible internal auditory structures appear normal. Negative visible scalp and face. IMPRESSION: 1. Continued stability of residual enhancing brain lesions. 2. No new metastatic disease or new intracranial abnormality. Electronically Signed   By: Genevie Ann M.D.   On: 01/08/2022 14:13   CT CHEST ABDOMEN PELVIS W CONTRAST  Result Date: 12/21/2021 CLINICAL DATA:  Primary Cancer Type: Lung Imaging Indication: Assess response to therapy Interval therapy since last imaging? Yes Initial Cancer Diagnosis Date: 11/05/2019; Established by: Biopsy-proven Detailed Pathology: Stage IV non-small cell lung cancer, adenocarcinoma. Primary Tumor location: Right lower lobe. Solitary brain metastasis. Surgeries: No. Chemotherapy: Yes; Ongoing? No; Most recent administration: 01/06/2020 Immunotherapy?  Yes; Type: Keytruda; Ongoing? Yes Radiation therapy? Yes Date Range: 03/05/2020; Target: Brain metastasis Date Range: 11/25/2019 - 01/09/2020; Target: Right lung Date Range: 11/14/2019; Target: Cerebellar metastasis * Tracking Code: BO * EXAM: CT CHEST,  ABDOMEN, AND PELVIS WITH CONTRAST TECHNIQUE: Multidetector CT imaging of the chest, abdomen and pelvis was performed following the standard protocol during bolus administration of intravenous contrast. RADIATION DOSE REDUCTION: This exam was performed according to the departmental dose-optimization program which includes automated exposure control, adjustment of the mA and/or kV according to patient size and/or use of iterative reconstruction technique. CONTRAST:  182mL OMNIPAQUE IOHEXOL 300 MG/ML  SOLN COMPARISON:  Most recent CT chest, abdomen and pelvis 09/09/2021. 11/13/2019 PET-CT. FINDINGS: CT CHEST FINDINGS Cardiovascular: RIGHT-sided Port-A-Cath terminating at the caval to atrial junction. Small pericardial effusion is similar to imaging from May of 2023 and without signs of nodularity. Aortic caliber is normal. Calcified and noncalcified aortic atherosclerosis. Central pulmonary vasculature is normal caliber. Mediastinum/Nodes: Mild circumferential diffuse esophageal thickening may be related to prior radiotherapy in is unchanged. No adenopathy in the chest. Lungs/Pleura: RIGHT perihilar volume loss, bronchiectasis and soft tissue following prior radiotherapy is a stable finding. No signs of pleural effusion. Airways are patent. Referenced bronchial wall thickening in the chest is very mild and unchanged. Musculoskeletal: See below for full musculoskeletal details. CT ABDOMEN PELVIS FINDINGS Hepatobiliary: Stable small hypodensity likely a small hemangioma in the lateral segment of the LEFT hepatic lobe measuring 13 mm (image 56/2) no focal, suspicious hepatic lesion. No pericholecystic stranding. No sign of biliary duct dilation. The portal vein is patent. Pancreas: Normal, without mass, inflammation or ductal dilatation. Spleen: Normal. Adrenals/Urinary Tract: Adrenal glands are normal. Smooth renal contours. No hydronephrosis. No suspicious renal lesion. No perivesical stranding. Stomach/Bowel: No acute  gastrointestinal findings.  Normal appendix. Vascular/Lymphatic: Moderate calcific and noncalcific atheromatous plaque of the abdominal aorta. Chronic occlusion of LEFT iliac vasculature, reconstituted in the distal external iliac and proximal femoral vasculature. These findings are unchanged. Retroaortic LEFT renal vein. There is no gastrohepatic or hepatoduodenal ligament lymphadenopathy. No retroperitoneal or mesenteric lymphadenopathy. No pelvic sidewall lymphadenopathy. Reproductive: Unremarkable by CT. Other: No ascites Musculoskeletal: No acute bone finding. No destructive bone process. Spinal degenerative changes. IMPRESSION: 1. Stable RIGHT  perihilar volume loss, bronchiectasis and soft tissue following prior radiotherapy. 2. No new or progressive findings. 3. Stable small pericardial effusion. 4. Changes of chronic vascular disease with occlusion of the LEFT iliac artery not changed from previous imaging. 5. Aortic atherosclerosis. Aortic Atherosclerosis (ICD10-I70.0). Electronically Signed   By: Zetta Bills M.D.   On: 12/21/2021 11:41     Assessment/Plan Malignant neoplasm metastatic to brain [facility]) [C79.31]  Joseph Atkins is clinically stable today.  MRI brain also demonstrates stable findings.  No neurologic changes.  He will continue on Keytruda monotherapy with Dr. Julien Nordmann.  Otherwise we ask that Joseph Atkins return to clinic in 6 months following next brain MRI, or sooner as needed.  We appreciate the opportunity to participate in the care of Joseph Atkins.   All questions were answered. The patient knows to call the clinic with any problems, questions or concerns. No barriers to learning were detected.  The total time spent in the encounter was 30 minutes and more than 50% was on counseling and review of test results   Ventura Sellers, MD Medical Director of Neuro-Oncology [facility] at [place] 01/11/22 11:21 AM

## 2022-01-13 ENCOUNTER — Other Ambulatory Visit: Payer: Self-pay

## 2022-01-13 ENCOUNTER — Other Ambulatory Visit: Payer: Self-pay | Admitting: Radiation Therapy

## 2022-01-18 ENCOUNTER — Telehealth: Payer: Self-pay | Admitting: Physician Assistant

## 2022-01-18 NOTE — Telephone Encounter (Signed)
Opened in error

## 2022-01-18 NOTE — Progress Notes (Unsigned)
[facility] OFFICE PROGRESS NOTE  [name], NP [address] [name] [address] [place] [address]  DIAGNOSIS: Stage IV carcinoma, non-small cell lung cancer, adenocarcinoma.  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 He was diagnosed in July 2021.   Molecular Biomarkers:  MSI-High DETECTED Pembrolizumab Atezolizumab, Avelumab, Cemiplimab, Dostarlimab, Durvalumab, Ipilimumab, Nivolumab   STK11Splice Site SNV 8.6% Everolimus, Temsirolimus Yes   KRASG12D 1.7% Binimetinib Yes   [id number] 0.4%   Niraparib, Olaparib, Rucaparib, Talazoparib, Tazemetostat Yes    PRIOR THERAPY: 1) SRS to the solitary brain metastasis under the care of Dr. Lisbeth Renshaw. Last treatment 7/22/212)  2) Weekly concurrent chemoradiation with carboplatin for an AUC of 2, paclitaxel 45 mg/m2.  First dose expected on 11/25/2019. Status post 7 cycles, last dose was giving 01/06/2020 with partial response.   CURRENT THERAPY: 1)  Immunotherapy with Keytruda 200 mg IV every 3 weeks.  First dose February 10, 2020 for a patient with MSI high.  Status post 31 cycles. 2) Avastin 15 mg/KG every 3 weeks.  For the vasogenic edema of the brain.S/P 7 cycles.  INTERVAL HISTORY: Joseph Atkins 61 y.o. male returns to the clinic today for a follow-up visit accompanied by his wife.  The patient is tolerating single agent immunotherapy with Keytruda well without any new concerning adverse side effects except he was referred to dermatology for pruritic nodular skin lesions on his arm, chest, back, and lower extremity.  He had an appointment with them recently. He is currently continuing treatment with antihistamines and kenalog cream.  Otherwise the patient is feeling fine. The patient denies any fever, chills, night sweats, or weight loss.  He reports stable dyspnea on exertion. Denies significant cough. Denies hemoptysis or  chest pain. He denies any nausea, vomiting, constipation ,or diarrhea. The patient is only had a follow-up appointment with Dr. Mickeal Skinner in which his MRI was stable.  The patient is here today for evaluation and repeat blood work before undergoing cycle #32.     MEDICAL HISTORY: Past Medical History:  Diagnosis Date   Cancer ([place])    lung cancer   Diverticulosis    GERD (gastroesophageal reflux disease)    Hx of small bowel obstruction    Hyperlipidemia    Hypertension    Hypothyroidism    IBS (irritable bowel syndrome)    Substance abuse ([place])    Alcoholic, Drug addition   Thyroid disease     ALLERGIES:  is allergic to penicillins.  MEDICATIONS:  Current Outpatient Medications  Medication Sig Dispense Refill   albuterol (PROVENTIL HFA;VENTOLIN HFA) 108 (90 Base) MCG/ACT inhaler Inhale 2 puffs into the lungs every 6 (six) hours as needed for wheezing or shortness of breath. 1 Inhaler 0   amLODipine (NORVASC) 5 MG tablet TAKE 1 TABLET (5 MG TOTAL) BY MOUTH DAILY. 90 tablet 1   ANORO ELLIPTA 62.5-25 MCG/INH AEPB 1 puff daily.     augmented betamethasone dipropionate (DIPROLENE-AF) 0.05 % cream Apply topically 2 (two) times daily.     [facility] ASPIRIN LOW DOSE 81 MG tablet TAKE 1 TABLET (81 MG TOTAL) BY MOUTH DAILY. SWALLOW WHOLE. 90 tablet 3   cyclobenzaprine (FLEXERIL) 10 MG tablet Take 10 mg by mouth 2 (two) times daily as needed for muscle spasms.     docusate sodium (COLACE) 100 MG capsule Take 100 mg by mouth daily.     DULoxetine (CYMBALTA) 20 MG capsule Take  1 capsule (20 mg total) by mouth 2 (two) times daily. 60 capsule 5   fluticasone furoate-vilanterol (BREO ELLIPTA) 100-25 MCG/INH AEPB Inhale 1 puff into the lungs daily. 30 each 3   hydrOXYzine (ATARAX) 25 MG tablet Take 25 mg by mouth at bedtime.     levothyroxine (SYNTHROID) 100 MCG tablet TAKE 1 TABLET BY MOUTH EVERY DAY 90 tablet 1   lidocaine-prilocaine (EMLA) cream Apply 1 application topically as needed. 30 g 0    omeprazole (PRILOSEC) 40 MG capsule Take 1 capsule (40 mg total) by mouth daily. 90 capsule 4   ondansetron (ZOFRAN) 4 MG tablet Take 1 tablet (4 mg total) by mouth every 8 (eight) hours as needed. 40 tablet 2   oxyCODONE-acetaminophen (PERCOCET/ROXICET) 5-325 MG tablet Take 1 tablet by mouth every 4 (four) hours as needed for severe pain. 30 tablet 0   pembrolizumab (KEYTRUDA) 100 MG/4ML SOLN See admin instructions.     polyethylene glycol powder (MIRALAX) powder Take 17 g by mouth daily. 255 g 11   prochlorperazine (COMPAZINE) 10 MG tablet Take 1 tablet (10 mg total) by mouth every 6 (six) hours as needed. 30 tablet 2   triamcinolone cream (KENALOG) 0.1 % APPLY TOPICALLY 2 TIMES DAILY AS NEEDED. 454 g 0   TRULANCE 3 MG TABS Take 1 tablet by mouth daily.     zolpidem (AMBIEN CR) 12.5 MG CR tablet Take 1 tablet (12.5 mg total) by mouth at bedtime as needed for sleep. 30 tablet 5   No current facility-administered medications for this visit.    SURGICAL HISTORY:  Past Surgical History:  Procedure Laterality Date   arm surgery Right    BRONCHIAL BRUSHINGS  10/24/2019   Procedure: BRONCHIAL BRUSHINGS;  Surgeon: Collene Gobble, MD;  Location: [facility] ENDOSCOPY;  Service: Cardiopulmonary;;  right lower lobe    BRONCHIAL BRUSHINGS  11/05/2019   Procedure: BRONCHIAL BRUSHINGS;  Surgeon: Collene Gobble, MD;  Location: [facility] ENDOSCOPY;  Service: Pulmonary;;   BRONCHIAL NEEDLE ASPIRATION BIOPSY  10/24/2019   Procedure: BRONCHIAL NEEDLE ASPIRATION BIOPSIES;  Surgeon: Collene Gobble, MD;  Location: [facility];  Service: Cardiopulmonary;;   BRONCHIAL NEEDLE ASPIRATION BIOPSY  11/05/2019   Procedure: BRONCHIAL NEEDLE ASPIRATION BIOPSIES;  Surgeon: Collene Gobble, MD;  Location: [facility] ENDOSCOPY;  Service: Pulmonary;;   ENDOBRONCHIAL ULTRASOUND N/A 10/24/2019   Procedure: ENDOBRONCHIAL ULTRASOUND;  Surgeon: Collene Gobble, MD;  Location: [facility];  Service: Cardiopulmonary;  Laterality: N/A;   FINGER SURGERY  Right    Middle   IR IMAGING GUIDED PORT INSERTION  11/19/2019   VIDEO BRONCHOSCOPY N/A 10/24/2019   Procedure: VIDEO BRONCHOSCOPY WITHOUT FLUORO;  Surgeon: Collene Gobble, MD;  Location: [facility];  Service: Cardiopulmonary;  Laterality: N/A;   VIDEO BRONCHOSCOPY WITH ENDOBRONCHIAL NAVIGATION N/A 11/05/2019   Procedure: VIDEO BRONCHOSCOPY WITH ENDOBRONCHIAL NAVIGATION;  Surgeon: Collene Gobble, MD;  Location: [facility] ENDOSCOPY;  Service: Pulmonary;  Laterality: N/A;    REVIEW OF SYSTEMS:   Review of Systems  Constitutional: Positive for stable fatigue. Negative for appetite change, chills, fever and unexpected weight change.  HENT:   Negative for mouth sores, nosebleeds, sore throat and trouble swallowing.   Eyes: Negative for eye problems and icterus.  Respiratory: Positive for baseline dyspnea on exertion.  Positive for baseline cough.  Negative for hemoptysis and wheezing.   Cardiovascular: Negative for chest pain and leg swelling.  Gastrointestinal: Negative for abdominal pain, constipation, diarrhea, nausea and vomiting.  Genitourinary: Negative for bladder incontinence, difficulty urinating,  dysuria, frequency and hematuria.   Musculoskeletal: Negative for back pain, gait problem, neck pain and neck stiffness.  Skin: Positive for dry scattered skin lesions/plaques.  Neurological: Negative for dizziness, extremity weakness, gait problem, headaches, light-headedness and seizures.  Hematological: Negative for adenopathy. Does not bruise/bleed easily.  Psychiatric/Behavioral: Negative for confusion, depression and sleep disturbance. The patient is not nervous/anxious.     PHYSICAL EXAMINATION:  Blood pressure 112/76, pulse 79, temperature 97.9 F (36.6 C), temperature source Oral, resp. rate 18, height 5' 8" (1.727 m), weight 183 lb 9.6 oz (83.3 kg), SpO2 99 %.  ECOG PERFORMANCE STATUS: 1  Physical Exam  Constitutional: Oriented to person, place, and time and well-developed,  well-nourished, and in no distress.  HENT:  Head: Normocephalic and atraumatic.  Mouth/Throat: Oropharynx is clear and moist. No oropharyngeal exudate.  Eyes: Conjunctivae are normal. Right eye exhibits no discharge. Left eye exhibits no discharge. No scleral icterus.  Neck: Normal range of motion. Neck supple.  Cardiovascular: Normal rate, regular rhythm, normal heart sounds and intact distal pulses.   Pulmonary/Chest: Effort normal and breath sounds normal. No respiratory distress. No wheezes. No rales.  Abdominal: Soft. Bowel sounds are normal. Exhibits no distension and no mass. There is no tenderness.  Musculoskeletal: Normal range of motion. Exhibits no edema.  Lymphadenopathy:    No cervical adenopathy.  Neurological: Alert and oriented to person, place, and time. Exhibits normal muscle tone. Gait normal. Coordination normal.  Skin: Positive for nodular raised skin lesions on his arms, back, chest, lower extremities.  Positive for bruising on upper extremities.  Skin is warm and dry. Not diaphoretic. No erythema. No pallor.  Psychiatric: Mood, memory and judgment normal.  Vitals reviewed.  LABORATORY DATA: Lab Results  Component Value Date   WBC 7.9 01/19/2022   HGB 13.6 01/19/2022   HCT 40.0 01/19/2022   MCV 88.3 01/19/2022   PLT 224 01/19/2022      Chemistry      Component Value Date/Time   NA 135 01/19/2022 0925   NA 141 03/08/2017 1707   K 3.9 01/19/2022 0925   CL 103 01/19/2022 0925   CO2 28 01/19/2022 0925   BUN 8 01/19/2022 0925   BUN 12 03/08/2017 1707   CREATININE 0.96 01/19/2022 0925      Component Value Date/Time   CALCIUM 9.1 01/19/2022 0925   ALKPHOS 69 01/19/2022 0925   AST 22 01/19/2022 0925   ALT 18 01/19/2022 0925   BILITOT 0.6 01/19/2022 0925       RADIOGRAPHIC STUDIES:  MR BRAIN W WO CONTRAST  Result Date: 01/08/2022 CLINICAL DATA:  61 year old male with a history of metastatic lung cancer. Status post SRS in 2021 and 2022. Restaging.  EXAM: MRI HEAD WITHOUT AND WITH CONTRAST TECHNIQUE: Multiplanar, multiecho pulse sequences of the brain and surrounding structures were obtained without and with intravenous contrast. CONTRAST:  73m MULTIHANCE GADOBENATE DIMEGLUMINE 529 MG/ML IV SOLN COMPARISON:  Brain MRI 09/09/2021 and earlier. FINDINGS: Brain: Chronic enhancing left cerebellar lesion appears stable since May, and may have contracted slightly along its left lateral margin since February (series 14, image 33 now versus series 12, image 31 then). Associated chronic hemosiderin but only mild intrinsic T1 hyperintensity. Approximately half a dozen additional small enhancing foci also appears stable since February, including those clustered in the superior left occipital lobe series 13, image 85, and the 2nd largest lesion in the superior left perirolandic cortex (series 13, image 134). These are annotated on series 11 today. Associated T2  and FLAIR hyperintensity in the left cerebellum has not significantly changed. No significant posterior fossa mass effect. No other areas suspicious for vasogenic edema. No acute infarct identified today. No midline shift, mass effect, ventriculomegaly, extra-axial collection or acute intracranial hemorrhage. Cervicomedullary junction and pituitary are within normal limits. No dural thickening identified. Vascular: Major intracranial vascular flow voids are stable. Skull and upper cervical spine: Negative visible cervical spine. Visualized bone marrow signal is within normal limits. Sinuses/Orbits: Stable.  Right maxillary sinus retention cyst. Other: Mastoids are clear. Visible internal auditory structures appear normal. Negative visible scalp and face. IMPRESSION: 1. Continued stability of residual enhancing brain lesions. 2. No new metastatic disease or new intracranial abnormality. Electronically Signed   By: Genevie Ann M.D.   On: 01/08/2022 14:13   CT CHEST ABDOMEN PELVIS W CONTRAST  Result Date:  12/21/2021 CLINICAL DATA:  Primary Cancer Type: Lung Imaging Indication: Assess response to therapy Interval therapy since last imaging? Yes Initial Cancer Diagnosis Date: 11/05/2019; Established by: Biopsy-proven Detailed Pathology: Stage IV non-small cell lung cancer, adenocarcinoma. Primary Tumor location: Right lower lobe. Solitary brain metastasis. Surgeries: No. Chemotherapy: Yes; Ongoing? No; Most recent administration: 01/06/2020 Immunotherapy?  Yes; Type: Keytruda; Ongoing? Yes Radiation therapy? Yes Date Range: 03/05/2020; Target: Brain metastasis Date Range: 11/25/2019 - 01/09/2020; Target: Right lung Date Range: 11/14/2019; Target: Cerebellar metastasis * Tracking Code: BO * EXAM: CT CHEST, ABDOMEN, AND PELVIS WITH CONTRAST TECHNIQUE: Multidetector CT imaging of the chest, abdomen and pelvis was performed following the standard protocol during bolus administration of intravenous contrast. RADIATION DOSE REDUCTION: This exam was performed according to the departmental dose-optimization program which includes automated exposure control, adjustment of the mA and/or kV according to patient size and/or use of iterative reconstruction technique. CONTRAST:  176m OMNIPAQUE IOHEXOL 300 MG/ML  SOLN COMPARISON:  Most recent CT chest, abdomen and pelvis 09/09/2021. 11/13/2019 PET-CT. FINDINGS: CT CHEST FINDINGS Cardiovascular: RIGHT-sided Port-A-Cath terminating at the caval to atrial junction. Small pericardial effusion is similar to imaging from May of 2023 and without signs of nodularity. Aortic caliber is normal. Calcified and noncalcified aortic atherosclerosis. Central pulmonary vasculature is normal caliber. Mediastinum/Nodes: Mild circumferential diffuse esophageal thickening may be related to prior radiotherapy in is unchanged. No adenopathy in the chest. Lungs/Pleura: RIGHT perihilar volume loss, bronchiectasis and soft tissue following prior radiotherapy is a stable finding. No signs of pleural effusion.  Airways are patent. Referenced bronchial wall thickening in the chest is very mild and unchanged. Musculoskeletal: See below for full musculoskeletal details. CT ABDOMEN PELVIS FINDINGS Hepatobiliary: Stable small hypodensity likely a small hemangioma in the lateral segment of the LEFT hepatic lobe measuring 13 mm (image 56/2) no focal, suspicious hepatic lesion. No pericholecystic stranding. No sign of biliary duct dilation. The portal vein is patent. Pancreas: Normal, without mass, inflammation or ductal dilatation. Spleen: Normal. Adrenals/Urinary Tract: Adrenal glands are normal. Smooth renal contours. No hydronephrosis. No suspicious renal lesion. No perivesical stranding. Stomach/Bowel: No acute gastrointestinal findings.  Normal appendix. Vascular/Lymphatic: Moderate calcific and noncalcific atheromatous plaque of the abdominal aorta. Chronic occlusion of LEFT iliac vasculature, reconstituted in the distal external iliac and proximal femoral vasculature. These findings are unchanged. Retroaortic LEFT renal vein. There is no gastrohepatic or hepatoduodenal ligament lymphadenopathy. No retroperitoneal or mesenteric lymphadenopathy. No pelvic sidewall lymphadenopathy. Reproductive: Unremarkable by CT. Other: No ascites Musculoskeletal: No acute bone finding. No destructive bone process. Spinal degenerative changes. IMPRESSION: 1. Stable RIGHT perihilar volume loss, bronchiectasis and soft tissue following prior radiotherapy. 2. No new or  progressive findings. 3. Stable small pericardial effusion. 4. Changes of chronic vascular disease with occlusion of the LEFT iliac artery not changed from previous imaging. 5. Aortic atherosclerosis. Aortic Atherosclerosis (ICD10-I70.0). Electronically Signed   By: Zetta Bills M.D.   On: 12/21/2021 11:41     ASSESSMENT/PLAN:  This is a very pleasant 61 year old Caucasian male diagnosed with stage IV non-small cell lung cancer, adenocarcinoma.  The patient presented with  a right lower lobe/infrahilar mass as well as a solitary brain metastasis in the left cerebellum. He was diagnosed in July 2021. His molecular studies by [facility] show he has MSI high which is a good marker for response to immunotherapy which will be important for future treatment.    The patient will complete SRS to the solidary brain metastasis under the care of Dr. Lisbeth Renshaw later today on 11/14/19.    He then underwent a course of weekly concurrent chemoradiation with carboplatin for an AUC of 2 and paclitaxel 45 mg per metered squared.  He tolerated this treatment well except for fatigue and mild odynophagia.  The patient showed evidence of disease progression with an increase in volume of the right infrahilar mass.  Since the patient has MSI high, the patient then underwent treatment with single agent immunotherapy with Keytruda 200 mg IV every 3 weeks.  The plan is to complete a total of 2 years unless the patient has unacceptable toxicity or disease progression.  The patient is status post 31 cycles.  He also received 7 cycles of Avastin for vasogenic edema in the brain.  Avastin has been on hold unless needed again in the future.    Labs were reviewed, recommend that he proceed with cycle #32 today as scheduled.   We will see him back for follow-up visit in 3 weeks for evaluation before starting cycle #33  For his itching, until he can see dermatology he will use his Kenalog cream.  Advised he may want to take Benadryl before bed since he scratches his lower extremities in his sleep.  Reviewed not to take Benadryl prior to driving as it may cause drowsiness.  The patient was advised to call immediately if she has any concerning symptoms in the interval. The patient voices understanding of current disease status and treatment options and is in agreement with the current care plan. All questions were answered. The patient knows to call the clinic with any problems, questions or concerns. We can  certainly see the patient much sooner if necessary    No orders of the defined types were placed in this encounter.    The total time spent in the appointment was 20-29 minutes.   Cassandra L Heilingoetter, PA-C 01/19/22

## 2022-01-19 ENCOUNTER — Encounter: Payer: Self-pay | Admitting: Physician Assistant

## 2022-01-19 ENCOUNTER — Inpatient Hospital Stay (HOSPITAL_BASED_OUTPATIENT_CLINIC_OR_DEPARTMENT_OTHER): Payer: Managed Care, Other (non HMO) | Admitting: Physician Assistant

## 2022-01-19 ENCOUNTER — Other Ambulatory Visit: Payer: Self-pay

## 2022-01-19 ENCOUNTER — Inpatient Hospital Stay: Payer: Managed Care, Other (non HMO)

## 2022-01-19 VITALS — BP 112/76 | HR 79 | Temp 97.9°F | Resp 18 | Ht 68.0 in | Wt 183.6 lb

## 2022-01-19 VITALS — BP 127/83 | HR 67

## 2022-01-19 DIAGNOSIS — C3491 Malignant neoplasm of unspecified part of right bronchus or lung: Secondary | ICD-10-CM

## 2022-01-19 DIAGNOSIS — Z5112 Encounter for antineoplastic immunotherapy: Secondary | ICD-10-CM

## 2022-01-19 DIAGNOSIS — C3431 Malignant neoplasm of lower lobe, right bronchus or lung: Secondary | ICD-10-CM | POA: Diagnosis not present

## 2022-01-19 DIAGNOSIS — Z95828 Presence of other vascular implants and grafts: Secondary | ICD-10-CM

## 2022-01-19 LAB — CMP (CANCER CENTER ONLY)
ALT: 18 U/L (ref 0–44)
AST: 22 U/L (ref 15–41)
Albumin: 4.2 g/dL (ref 3.5–5.0)
Alkaline Phosphatase: 69 U/L (ref 38–126)
Anion gap: 4 — ABNORMAL LOW (ref 5–15)
BUN: 8 mg/dL (ref 6–20)
CO2: 28 mmol/L (ref 22–32)
Calcium: 9.1 mg/dL (ref 8.9–10.3)
Chloride: 103 mmol/L (ref 98–111)
Creatinine: 0.96 mg/dL (ref 0.61–1.24)
GFR, Estimated: >60 mL/min (ref >60)
Glucose, Bld: 153 mg/dL — ABNORMAL HIGH (ref 70–99)
Potassium: 3.9 mmol/L (ref 3.5–5.1)
Sodium: 135 mmol/L (ref 135–145)
Total Bilirubin: 0.6 mg/dL (ref 0.3–1.2)
Total Protein: 7 g/dL (ref 6.5–8.1)

## 2022-01-19 LAB — CBC WITH DIFFERENTIAL (CANCER CENTER ONLY)
Abs Immature Granulocytes: 0.04 10*3/uL (ref 0.00–0.07)
Basophils Absolute: 0.1 10*3/uL (ref 0.0–0.1)
Basophils Relative: 1 %
Eosinophils Absolute: 0.3 10*3/uL (ref 0.0–0.5)
Eosinophils Relative: 4 %
HCT: 40 % (ref 39.0–52.0)
Hemoglobin: 13.6 g/dL (ref 13.0–17.0)
Immature Granulocytes: 1 %
Lymphocytes Relative: 14 %
Lymphs Abs: 1.1 10*3/uL (ref 0.7–4.0)
MCH: 30 pg (ref 26.0–34.0)
MCHC: 34 g/dL (ref 30.0–36.0)
MCV: 88.3 fL (ref 80.0–100.0)
Monocytes Absolute: 0.6 10*3/uL (ref 0.1–1.0)
Monocytes Relative: 8 %
Neutro Abs: 5.7 10*3/uL (ref 1.7–7.7)
Neutrophils Relative %: 72 %
Platelet Count: 224 10*3/uL (ref 150–400)
RBC: 4.53 MIL/uL (ref 4.22–5.81)
RDW: 13.2 % (ref 11.5–15.5)
WBC Count: 7.9 10*3/uL (ref 4.0–10.5)
nRBC: 0 % (ref 0.0–0.2)

## 2022-01-19 LAB — TSH: TSH: 4.115 u[IU]/mL (ref 0.350–4.500)

## 2022-01-19 MED ORDER — SODIUM CHLORIDE 0.9% FLUSH
10.0000 mL | INTRAVENOUS | Status: DC | PRN
  Administered 2022-01-19: 10 mL

## 2022-01-19 MED ORDER — SODIUM CHLORIDE 0.9 % IV SOLN
200.0000 mg | Freq: Once | INTRAVENOUS | Status: AC
  Administered 2022-01-19: 200 mg via INTRAVENOUS
  Filled 2022-01-19: qty 200

## 2022-01-19 MED ORDER — HEPARIN SOD (PORK) LOCK FLUSH 100 UNIT/ML IV SOLN
500.0000 [IU] | Freq: Once | INTRAVENOUS | Status: AC | PRN
  Administered 2022-01-19: 500 [IU]

## 2022-01-19 MED ORDER — SODIUM CHLORIDE 0.9 % IV SOLN: Freq: Once | INTRAVENOUS | Status: AC

## 2022-01-19 NOTE — Patient Instructions (Signed)
[facility]COL[facility]   Discharge Instructions: Thank you for choosing [facility] to provide your oncology and hematology care.   If you have a lab appointment with the [facility], please go directly to the [facility] and check in at the registration area.   Wear comfortable clothing and clothing appropriate for easy access to any Portacath or PICC line.   We strive to give you quality time with your provider. You may need to reschedule your appointment if you arrive late (15 or more minutes).  Arriving late affects you and other patients whose appointments are after yours.  Also, if you miss three or more appointments without notifying the office, you may be dismissed from the clinic at the provider's discretion.      For prescription refill requests, have your pharmacy contact our office and allow 72 hours for refills to be completed.    Today you received the following chemotherapy and/or immunotherapy agents: pembrolizumab      To help prevent nausea and vomiting after your treatment, we encourage you to take your nausea medication as directed.  BELOW ARE SYMPTOMS THAT SHOULD BE REPORTED IMMEDIATELY: *FEVER GREATER THAN 100.4 F (38 C) OR HIGHER *CHILLS OR SWEATING *NAUSEA AND VOMITING THAT IS NOT CONTROLLED WITH YOUR NAUSEA MEDICATION *UNUSUAL SHORTNESS OF BREATH *UNUSUAL BRUISING OR BLEEDING *URINARY PROBLEMS (pain or burning when urinating, or frequent urination) *BOWEL PROBLEMS (unusual diarrhea, constipation, pain near the anus) TENDERNESS IN MOUTH AND THROAT WITH OR WITHOUT PRESENCE OF ULCERS (sore throat, sores in mouth, or a toothache) UNUSUAL RASH, SWELLING OR PAIN  UNUSUAL VAGINAL DISCHARGE OR ITCHING   Items with * indicate a potential emergency and should be followed up as soon as possible or go to the Emergency Department if any problems should occur.  Please show the CHEMOTHERAPY ALERT CARD or IMMUNOTHERAPY ALERT CARD at  check-in to the Emergency Department and triage nurse.  Should you have questions after your visit or need to cancel or reschedule your appointment, please contact [facility]  Dept: [phone]  and follow the prompts.  Office hours are 8:00 a.m. to 4:30 p.m. Monday - Friday. Please note that voicemails left after 4:00 p.m. may not be returned until the following business day.  We are closed weekends and major holidays. You have access to a nurse at all times for urgent questions. Please call the main number to the clinic Dept: [phone] and follow the prompts.   For any non-urgent questions, you may also contact your provider using MyChart. We now offer e-Visits for anyone 19 and older to request care online for non-urgent symptoms. For details visit [email or web address].   Also download the MyChart app! Go to the app store, search "MyChart", open the app, select [facility], and log in with your MyChart username and password.  Masks are optional in the cancer centers. If you would like for your care team to wear a mask while they are taking care of you, please let them know. You may have one support person who is at least 60 years old accompany you for your appointments.

## 2022-02-07 ENCOUNTER — Telehealth: Payer: Self-pay | Admitting: Internal Medicine

## 2022-02-07 NOTE — Telephone Encounter (Signed)
Called patient regarding upcoming October and November appointments, left a voicemail.

## 2022-02-09 ENCOUNTER — Other Ambulatory Visit: Payer: Self-pay

## 2022-02-09 ENCOUNTER — Inpatient Hospital Stay (HOSPITAL_BASED_OUTPATIENT_CLINIC_OR_DEPARTMENT_OTHER): Payer: Managed Care, Other (non HMO) | Admitting: Internal Medicine

## 2022-02-09 ENCOUNTER — Inpatient Hospital Stay: Payer: Managed Care, Other (non HMO) | Attending: Internal Medicine

## 2022-02-09 ENCOUNTER — Inpatient Hospital Stay: Payer: Managed Care, Other (non HMO)

## 2022-02-09 VITALS — BP 128/84 | HR 87 | Resp 19 | Ht 68.0 in | Wt 181.0 lb

## 2022-02-09 DIAGNOSIS — K589 Irritable bowel syndrome without diarrhea: Secondary | ICD-10-CM | POA: Insufficient documentation

## 2022-02-09 DIAGNOSIS — R1310 Dysphagia, unspecified: Secondary | ICD-10-CM | POA: Insufficient documentation

## 2022-02-09 DIAGNOSIS — Z79890 Hormone replacement therapy (postmenopausal): Secondary | ICD-10-CM | POA: Diagnosis not present

## 2022-02-09 DIAGNOSIS — Z7982 Long term (current) use of aspirin: Secondary | ICD-10-CM | POA: Diagnosis not present

## 2022-02-09 DIAGNOSIS — Z7951 Long term (current) use of inhaled steroids: Secondary | ICD-10-CM | POA: Diagnosis not present

## 2022-02-09 DIAGNOSIS — E039 Hypothyroidism, unspecified: Secondary | ICD-10-CM | POA: Insufficient documentation

## 2022-02-09 DIAGNOSIS — Z79899 Other long term (current) drug therapy: Secondary | ICD-10-CM | POA: Diagnosis not present

## 2022-02-09 DIAGNOSIS — R21 Rash and other nonspecific skin eruption: Secondary | ICD-10-CM | POA: Diagnosis not present

## 2022-02-09 DIAGNOSIS — E785 Hyperlipidemia, unspecified: Secondary | ICD-10-CM | POA: Diagnosis not present

## 2022-02-09 DIAGNOSIS — Z5112 Encounter for antineoplastic immunotherapy: Secondary | ICD-10-CM | POA: Insufficient documentation

## 2022-02-09 DIAGNOSIS — I10 Essential (primary) hypertension: Secondary | ICD-10-CM | POA: Diagnosis not present

## 2022-02-09 DIAGNOSIS — C3491 Malignant neoplasm of unspecified part of right bronchus or lung: Secondary | ICD-10-CM

## 2022-02-09 DIAGNOSIS — C3431 Malignant neoplasm of lower lobe, right bronchus or lung: Secondary | ICD-10-CM | POA: Insufficient documentation

## 2022-02-09 DIAGNOSIS — K219 Gastro-esophageal reflux disease without esophagitis: Secondary | ICD-10-CM | POA: Insufficient documentation

## 2022-02-09 DIAGNOSIS — Z95828 Presence of other vascular implants and grafts: Secondary | ICD-10-CM

## 2022-02-09 LAB — CBC WITH DIFFERENTIAL (CANCER CENTER ONLY)
Abs Immature Granulocytes: 0.02 10*3/uL (ref 0.00–0.07)
Basophils Absolute: 0.1 10*3/uL (ref 0.0–0.1)
Basophils Relative: 1 %
Eosinophils Absolute: 0.3 10*3/uL (ref 0.0–0.5)
Eosinophils Relative: 4 %
HCT: 42.1 % (ref 39.0–52.0)
Hemoglobin: 14.3 g/dL (ref 13.0–17.0)
Immature Granulocytes: 0 %
Lymphocytes Relative: 18 %
Lymphs Abs: 1.4 10*3/uL (ref 0.7–4.0)
MCH: 30.2 pg (ref 26.0–34.0)
MCHC: 34 g/dL (ref 30.0–36.0)
MCV: 88.8 fL (ref 80.0–100.0)
Monocytes Absolute: 0.7 10*3/uL (ref 0.1–1.0)
Monocytes Relative: 10 %
Neutro Abs: 4.9 10*3/uL (ref 1.7–7.7)
Neutrophils Relative %: 67 %
Platelet Count: 243 10*3/uL (ref 150–400)
RBC: 4.74 MIL/uL (ref 4.22–5.81)
RDW: 13.2 % (ref 11.5–15.5)
WBC Count: 7.4 10*3/uL (ref 4.0–10.5)
nRBC: 0 % (ref 0.0–0.2)

## 2022-02-09 LAB — CMP (CANCER CENTER ONLY)
ALT: 20 U/L (ref 0–44)
AST: 23 U/L (ref 15–41)
Albumin: 4.4 g/dL (ref 3.5–5.0)
Alkaline Phosphatase: 71 U/L (ref 38–126)
Anion gap: 6 (ref 5–15)
BUN: 8 mg/dL (ref 6–20)
CO2: 26 mmol/L (ref 22–32)
Calcium: 9.2 mg/dL (ref 8.9–10.3)
Chloride: 103 mmol/L (ref 98–111)
Creatinine: 0.98 mg/dL (ref 0.61–1.24)
GFR, Estimated: >60 mL/min (ref >60)
Glucose, Bld: 110 mg/dL — ABNORMAL HIGH (ref 70–99)
Potassium: 4.4 mmol/L (ref 3.5–5.1)
Sodium: 135 mmol/L (ref 135–145)
Total Bilirubin: 0.7 mg/dL (ref 0.3–1.2)
Total Protein: 7.2 g/dL (ref 6.5–8.1)

## 2022-02-09 LAB — TSH: TSH: 4.048 u[IU]/mL (ref 0.350–4.500)

## 2022-02-09 MED ORDER — SODIUM CHLORIDE 0.9% FLUSH
10.0000 mL | INTRAVENOUS | Status: DC | PRN
  Administered 2022-02-09: 10 mL

## 2022-02-09 MED ORDER — SODIUM CHLORIDE 0.9 % IV SOLN: Freq: Once | INTRAVENOUS | Status: AC

## 2022-02-09 MED ORDER — SODIUM CHLORIDE 0.9 % IV SOLN
200.0000 mg | Freq: Once | INTRAVENOUS | Status: AC
  Administered 2022-02-09: 200 mg via INTRAVENOUS
  Filled 2022-02-09: qty 200

## 2022-02-09 MED ORDER — HEPARIN SOD (PORK) LOCK FLUSH 100 UNIT/ML IV SOLN
500.0000 [IU] | Freq: Once | INTRAVENOUS | Status: AC | PRN
  Administered 2022-02-09: 500 [IU]

## 2022-02-09 NOTE — Progress Notes (Signed)
Joseph Atkins [phone]   [phone]  OFFICE PROGRESS NOTE  [name], NP [address] [name] [address] [place] [address]  DIAGNOSIS: Stage IV (T3, N0, M1C) non-small cell lung cancer, adenocarcinoma.  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 He was diagnosed in July 2021.   Molecular Biomarkers:  MSI-High DETECTED Pembrolizumab Atezolizumab, Avelumab, Cemiplimab, Dostarlimab, Durvalumab, Ipilimumab, Nivolumab   STK11Splice Site SNV 0.1% Everolimus, Temsirolimus Yes   [id number] 1.7% Binimetinib Yes   [id number] 0.4%   Niraparib, Olaparib, Rucaparib, Talazoparib, Tazemetostat Yes   PRIOR THERAPY:  1) SRS to the solitary brain metastasis under the care of Dr. Lisbeth Renshaw. Last treatment 11/14/19. 2) Weekly concurrent chemoradiation with carboplatin for an AUC of 2, paclitaxel 45 mg/m2.  First dose expected on 11/25/2019. Status post 7 cycles, last dose was given 01/06/2020 with partial response.    CURRENT THERAPY:  1)  Immunotherapy with Keytruda 200 mg IV every 3 weeks.  First dose February 10, 2020 for a patient with MSI high.  Status post 32  cycles. 2) Avastin 15 mg/KG every 3 weeks.  First dose today for the vasogenic edema of the brain.S/P 7 cycles.  INTERVAL HISTORY: Joseph Atkins 61 y.o. male returns to the clinic today for follow-up visit accompanied by his wife.  The patient is feeling fine today with no concerning complaints.  He denied having any chest pain, shortness of breath, cough or hemoptysis.  He continues to have the skin lesions and applying Kenalog cream to it.  He denied having any nausea, vomiting, diarrhea or constipation.  He has no headache or visual changes.  He denied having any recent weight loss or night sweats.  He is here today for evaluation before starting cycle #33.  MEDICAL HISTORY: Past Medical History:   Diagnosis Date   Cancer ([place])    lung cancer   Diverticulosis    GERD (gastroesophageal reflux disease)    Hx of small bowel obstruction    Hyperlipidemia    Hypertension    Hypothyroidism    IBS (irritable bowel syndrome)    Substance abuse ([place])    Alcoholic, Drug addition   Thyroid disease     ALLERGIES:  is allergic to penicillins.  MEDICATIONS:  Current Outpatient Medications  Medication Sig Dispense Refill   albuterol (PROVENTIL HFA;VENTOLIN HFA) 108 (90 Base) MCG/ACT inhaler Inhale 2 puffs into the lungs every 6 (six) hours as needed for wheezing or shortness of breath. 1 Inhaler 0   amLODipine (NORVASC) 5 MG tablet TAKE 1 TABLET (5 MG TOTAL) BY MOUTH DAILY. 90 tablet 1   ANORO ELLIPTA 62.5-25 MCG/INH AEPB 1 puff daily.     augmented betamethasone dipropionate (DIPROLENE-AF) 0.05 % cream Apply topically 2 (two) times daily.     [facility] ASPIRIN LOW DOSE 81 MG tablet TAKE 1 TABLET (81 MG TOTAL) BY MOUTH DAILY. SWALLOW WHOLE. 90 tablet 3   cyclobenzaprine (FLEXERIL) 10 MG tablet Take 10 mg by mouth 2 (two) times daily as needed for muscle spasms.     docusate sodium (COLACE) 100 MG capsule Take 100 mg by mouth daily.     DULoxetine (CYMBALTA) 20 MG capsule Take 1 capsule (20 mg total) by mouth 2 (two) times daily. 60 capsule 5   fluticasone furoate-vilanterol (BREO ELLIPTA) 100-25 MCG/INH AEPB Inhale 1 puff into the lungs daily. 30 each 3   hydrOXYzine (ATARAX)  25 MG tablet Take 25 mg by mouth at bedtime.     levothyroxine (SYNTHROID) 100 MCG tablet TAKE 1 TABLET BY MOUTH EVERY DAY 90 tablet 1   lidocaine-prilocaine (EMLA) cream Apply 1 application topically as needed. 30 g 0   omeprazole (PRILOSEC) 40 MG capsule Take 1 capsule (40 mg total) by mouth daily. 90 capsule 4   ondansetron (ZOFRAN) 4 MG tablet Take 1 tablet (4 mg total) by mouth every 8 (eight) hours as needed. 40 tablet 2   oxyCODONE-acetaminophen (PERCOCET/ROXICET) 5-325 MG tablet Take 1 tablet by mouth every 4 (four)  hours as needed for severe pain. 30 tablet 0   pembrolizumab (KEYTRUDA) 100 MG/4ML SOLN See admin instructions.     polyethylene glycol powder (MIRALAX) powder Take 17 g by mouth daily. 255 g 11   prochlorperazine (COMPAZINE) 10 MG tablet Take 1 tablet (10 mg total) by mouth every 6 (six) hours as needed. 30 tablet 2   triamcinolone cream (KENALOG) 0.1 % APPLY TOPICALLY 2 TIMES DAILY AS NEEDED. 454 g 0   TRULANCE 3 MG TABS Take 1 tablet by mouth daily.     zolpidem (AMBIEN CR) 12.5 MG CR tablet Take 1 tablet (12.5 mg total) by mouth at bedtime as needed for sleep. 30 tablet 5   No current facility-administered medications for this visit.    SURGICAL HISTORY:  Past Surgical History:  Procedure Laterality Date   arm surgery Right    BRONCHIAL BRUSHINGS  10/24/2019   Procedure: BRONCHIAL BRUSHINGS;  Surgeon: Collene Gobble, MD;  Location: [facility] ENDOSCOPY;  Service: Cardiopulmonary;;  right lower lobe    BRONCHIAL BRUSHINGS  11/05/2019   Procedure: BRONCHIAL BRUSHINGS;  Surgeon: Collene Gobble, MD;  Location: [facility] ENDOSCOPY;  Service: Pulmonary;;   BRONCHIAL NEEDLE ASPIRATION BIOPSY  10/24/2019   Procedure: BRONCHIAL NEEDLE ASPIRATION BIOPSIES;  Surgeon: Collene Gobble, MD;  Location: [place];  Service: Cardiopulmonary;;   BRONCHIAL NEEDLE ASPIRATION BIOPSY  11/05/2019   Procedure: BRONCHIAL NEEDLE ASPIRATION BIOPSIES;  Surgeon: Collene Gobble, MD;  Location: [facility] ENDOSCOPY;  Service: Pulmonary;;   ENDOBRONCHIAL ULTRASOUND N/A 10/24/2019   Procedure: ENDOBRONCHIAL ULTRASOUND;  Surgeon: Collene Gobble, MD;  Location: [place];  Service: Cardiopulmonary;  Laterality: N/A;   FINGER SURGERY Right    Middle   IR IMAGING GUIDED PORT INSERTION  11/19/2019   VIDEO BRONCHOSCOPY N/A 10/24/2019   Procedure: VIDEO BRONCHOSCOPY WITHOUT FLUORO;  Surgeon: Collene Gobble, MD;  Location: [place];  Service: Cardiopulmonary;  Laterality: N/A;   VIDEO BRONCHOSCOPY WITH ENDOBRONCHIAL NAVIGATION N/A 11/05/2019    Procedure: VIDEO BRONCHOSCOPY WITH ENDOBRONCHIAL NAVIGATION;  Surgeon: Collene Gobble, MD;  Location: [place] ENDOSCOPY;  Service: Pulmonary;  Laterality: N/A;    REVIEW OF SYSTEMS:  A comprehensive review of systems was negative except for: Integument/breast: positive for rash and skin lesion(s)   PHYSICAL EXAMINATION: General appearance: alert, cooperative, fatigued, and no distress Head: Normocephalic, without obvious abnormality, atraumatic Neck: no adenopathy, no JVD, supple, symmetrical, trachea midline, and thyroid not enlarged, symmetric, no tenderness/mass/nodules Lymph nodes: Cervical, supraclavicular, and axillary nodes normal. Resp: clear to auscultation bilaterally Back: symmetric, no curvature. ROM normal. No CVA tenderness. Cardio: regular rate and rhythm, S1, S2 normal, no murmur, click, rub or gallop GI: soft, non-tender; bowel sounds normal; no masses,  no organomegaly Extremities: extremities normal, atraumatic, no cyanosis or edema  ECOG PERFORMANCE STATUS: 1 - Symptomatic but completely ambulatory  Blood pressure 128/84, pulse 87, resp. rate 19, height $RemoveBe'5\' 8"'gLpYcuNjP$  (1.727  m), weight 181 lb (82.1 kg), SpO2 100 %.  LABORATORY DATA: Lab Results  Component Value Date   WBC 7.4 02/09/2022   HGB 14.3 02/09/2022   HCT 42.1 02/09/2022   MCV 88.8 02/09/2022   PLT 243 02/09/2022      Chemistry      Component Value Date/Time   NA 135 01/19/2022 0925   NA 141 03/08/2017 1707   K 3.9 01/19/2022 0925   CL 103 01/19/2022 0925   CO2 28 01/19/2022 0925   BUN 8 01/19/2022 0925   BUN 12 03/08/2017 1707   CREATININE 0.96 01/19/2022 0925      Component Value Date/Time   CALCIUM 9.1 01/19/2022 0925   ALKPHOS 69 01/19/2022 0925   AST 22 01/19/2022 0925   ALT 18 01/19/2022 0925   BILITOT 0.6 01/19/2022 0925       RADIOGRAPHIC STUDIES: No results found.   ASSESSMENT AND PLAN: This is a very pleasant 61 years old white male with stage IV (T3, N0, M1c) non-small cell lung  cancer, adenocarcinoma with MSI high presented with right lower lobe/infrahilar mass in addition to solitary brain metastasis in the left cerebellum diagnosed in July 2021. He is status post SRS to the solitary brain metastasis. The patient completed a course of concurrent chemoradiation with weekly carboplatin and paclitaxel.  He tolerated the treatment well except for fatigue and mild odynophagia. The patient has MSI high and I recommended for him treatment with immunotherapy with single agent Keytruda 200 mg IV every 3 weeks for a total of 2 years unless the patient has unacceptable toxicity or disease progression. He is status post 32  cycles of treatment with Keytruda.  He also received 7 cycles of Avastin for the vasogenic edema in the brain.   Avastin will be on hold for now unless needed in the future. The patient has been tolerating his treatment with Keytruda fairly well. I recommended for him to proceed with cycle #33 today as planned. I will see him back for follow-up visit in 3 weeks for evaluation before starting cycle #34. For the skin rash he will continue his treatment with Kenalog and he is followed by dermatology. The patient was advised to call immediately if he has any other concerning symptoms in the interval. The patient voices understanding of current disease status and treatment options and is in agreement with the current care plan.  All questions were answered. The patient knows to call the clinic with any problems, questions or concerns. We can certainly see the patient much sooner if necessary.  Disclaimer: This note was dictated with voice recognition software. Similar sounding words can inadvertently be transcribed and may not be corrected upon review.

## 2022-02-09 NOTE — Patient Instructions (Signed)
[facility]   Discharge Instructions: Thank you for choosing [facility] to provide your oncology and hematology care.   If you have a lab appointment with the [facility], please go directly to the [facility] and check in at the registration area.   Wear comfortable clothing and clothing appropriate for easy access to any Portacath or PICC line.   We strive to give you quality time with your provider. You may need to reschedule your appointment if you arrive late (15 or more minutes).  Arriving late affects you and other patients whose appointments are after yours.  Also, if you miss three or more appointments without notifying the office, you may be dismissed from the clinic at the provider's discretion.      For prescription refill requests, have your pharmacy contact our office and allow 72 hours for refills to be completed.    Today you received the following chemotherapy and/or immunotherapy agents: pembrolizumab      To help prevent nausea and vomiting after your treatment, we encourage you to take your nausea medication as directed.  BELOW ARE SYMPTOMS THAT SHOULD BE REPORTED IMMEDIATELY: *FEVER GREATER THAN 100.4 F (38 C) OR HIGHER *CHILLS OR SWEATING *NAUSEA AND VOMITING THAT IS NOT CONTROLLED WITH YOUR NAUSEA MEDICATION *UNUSUAL SHORTNESS OF BREATH *UNUSUAL BRUISING OR BLEEDING *URINARY PROBLEMS (pain or burning when urinating, or frequent urination) *BOWEL PROBLEMS (unusual diarrhea, constipation, pain near the anus) TENDERNESS IN MOUTH AND THROAT WITH OR WITHOUT PRESENCE OF ULCERS (sore throat, sores in mouth, or a toothache) UNUSUAL RASH, SWELLING OR PAIN  UNUSUAL VAGINAL DISCHARGE OR ITCHING   Items with * indicate a potential emergency and should be followed up as soon as possible or go to the Emergency Department if any problems should occur.  Please show the CHEMOTHERAPY ALERT CARD or IMMUNOTHERAPY ALERT CARD at  check-in to the Emergency Department and triage nurse.  Should you have questions after your visit or need to cancel or reschedule your appointment, please contact [facility]  Dept: [phone]  and follow the prompts.  Office hours are 8:00 a.m. to 4:30 p.m. Monday - Friday. Please note that voicemails left after 4:00 p.m. may not be returned until the following business day.  We are closed weekends and major holidays. You have access to a nurse at all times for urgent questions. Please call the main number to the clinic Dept: [phone] and follow the prompts.   For any non-urgent questions, you may also contact your provider using MyChart. We now offer e-Visits for anyone 27 and older to request care online for non-urgent symptoms. For details visit [email or web address].   Also download the MyChart app! Go to the app store, search "MyChart", open the app, select [facility], and log in with your MyChart username and password.  Masks are optional in the cancer centers. If you would like for your care team to wear a mask while they are taking care of you, please let them know. You may have one support person who is at least 60 years old accompany you for your appointments.

## 2022-02-10 LAB — T4: T4, Total: 9.8 ug/dL (ref 4.5–12.0)

## 2022-02-28 NOTE — Progress Notes (Unsigned)
[facility] OFFICE PROGRESS NOTE  [name], NP [address] [name] [address] [place] [address]  DIAGNOSIS: Stage IV carcinoma, non-small cell lung cancer, adenocarcinoma.  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 He was diagnosed in July 2021.   Molecular Biomarkers:  MSI-High DETECTED Pembrolizumab Atezolizumab, Avelumab, Cemiplimab, Dostarlimab, Durvalumab, Ipilimumab, Nivolumab   STK11Splice Site SNV 2.5% Everolimus, Temsirolimus Yes   KRASG12D 1.7% Binimetinib Yes   [id number] 0.4%   Niraparib, Olaparib, Rucaparib, Talazoparib, Tazemetostat Yes  PRIOR THERAPY: 1) SRS to the solitary brain metastasis under the care of Dr. Lisbeth Renshaw. Last treatment 7/22/212)  2) Weekly concurrent chemoradiation with carboplatin for an AUC of 2, paclitaxel 45 mg/m2.  First dose expected on 11/25/2019. Status post 7 cycles, last dose was giving 01/06/2020 with partial response  CURRENT THERAPY: 1)  Immunotherapy with Keytruda 200 mg IV every 3 weeks.  First dose February 10, 2020 for a patient with MSI high.  Status post 33 cycles. 2) Avastin 15 mg/KG every 3 weeks.  For the vasogenic edema of the brain.S/P 7 cycles.  INTERVAL HISTORY: TAKAO LIZER 61 y.o. male returns to the clinic today for a follow-up visit accompanied by his wife.  The patient is tolerating single agent immunotherapy with Keytruda well without any new concerning adverse side effects except he was referred to dermatology for pruritic nodular skin lesions on his arm, chest, back, and lower extremity.  He had an appointment with them recently. He was started on Zyrtec on the AM. He was prescribed another medication in the PM. He also states he was prescribed another cream.   Otherwise the patient is feeling fine. The patient denies any fever, chills, night sweats, or weight loss.  He reports stable dyspnea on exertion.  He recently went to [place] and did a lot of walking and reports his breathing was not a problem. Denies significant cough besides his "normal" cough. Denies hemoptysis or chest pain. He denies any nausea, vomiting, constipation ,or diarrhea. He follows closely with neuro-oncology for his history of metastatic disease to the brain. The patient is here today for evaluation and repeat blood work before undergoing cycle #34.     MEDICAL HISTORY: Past Medical History:  Diagnosis Date   Cancer ([place])    lung cancer   Diverticulosis    GERD (gastroesophageal reflux disease)    Hx of small bowel obstruction    Hyperlipidemia    Hypertension    Hypothyroidism    IBS (irritable bowel syndrome)    Substance abuse ([place])    Alcoholic, Drug addition   Thyroid disease     ALLERGIES:  is allergic to penicillins.  MEDICATIONS:  Current Outpatient Medications  Medication Sig Dispense Refill   albuterol (PROVENTIL HFA;VENTOLIN HFA) 108 (90 Base) MCG/ACT inhaler Inhale 2 puffs into the lungs every 6 (six) hours as needed for wheezing or shortness of breath. 1 Inhaler 0   amLODipine (NORVASC) 5 MG tablet TAKE 1 TABLET (5 MG TOTAL) BY MOUTH DAILY. 90 tablet 1   ANORO ELLIPTA 62.5-25 MCG/INH AEPB 1 puff daily.     augmented betamethasone dipropionate (DIPROLENE-AF) 0.05 % cream Apply topically 2 (two) times daily.     [facility] ASPIRIN LOW DOSE 81 MG tablet TAKE 1 TABLET (81 MG TOTAL) BY MOUTH DAILY. SWALLOW WHOLE. 90 tablet 3   cyclobenzaprine (FLEXERIL) 10 MG tablet Take 10 mg by mouth 2 (two) times daily  as needed for muscle spasms.     docusate sodium (COLACE) 100 MG capsule Take 100 mg by mouth daily.     DULoxetine (CYMBALTA) 20 MG capsule Take 1 capsule (20 mg total) by mouth 2 (two) times daily. 60 capsule 5   fluticasone furoate-vilanterol (BREO ELLIPTA) 100-25 MCG/INH AEPB Inhale 1 puff into the lungs daily. 30 each 3   hydrOXYzine (ATARAX) 25 MG tablet Take 25 mg by mouth at bedtime.      levothyroxine (SYNTHROID) 100 MCG tablet TAKE 1 TABLET BY MOUTH EVERY DAY 90 tablet 1   lidocaine-prilocaine (EMLA) cream Apply 1 application topically as needed. 30 g 0   omeprazole (PRILOSEC) 40 MG capsule Take 1 capsule (40 mg total) by mouth daily. 90 capsule 4   ondansetron (ZOFRAN) 4 MG tablet Take 1 tablet (4 mg total) by mouth every 8 (eight) hours as needed. 40 tablet 2   oxyCODONE-acetaminophen (PERCOCET/ROXICET) 5-325 MG tablet Take 1 tablet by mouth every 4 (four) hours as needed for severe pain. 30 tablet 0   pembrolizumab (KEYTRUDA) 100 MG/4ML SOLN See admin instructions.     polyethylene glycol powder (MIRALAX) powder Take 17 g by mouth daily. 255 g 11   prochlorperazine (COMPAZINE) 10 MG tablet Take 1 tablet (10 mg total) by mouth every 6 (six) hours as needed. 30 tablet 2   TRULANCE 3 MG TABS Take 1 tablet by mouth daily.     zolpidem (AMBIEN CR) 12.5 MG CR tablet Take 1 tablet (12.5 mg total) by mouth at bedtime as needed for sleep. 30 tablet 5   triamcinolone cream (KENALOG) 0.1 % APPLY TOPICALLY 2 TIMES DAILY AS NEEDED. 454 g 0   No current facility-administered medications for this visit.    SURGICAL HISTORY:  Past Surgical History:  Procedure Laterality Date   arm surgery Right    BRONCHIAL BRUSHINGS  10/24/2019   Procedure: BRONCHIAL BRUSHINGS;  Surgeon: Collene Gobble, MD;  Location: [facility] ENDOSCOPY;  Service: Cardiopulmonary;;  right lower lobe    BRONCHIAL BRUSHINGS  11/05/2019   Procedure: BRONCHIAL BRUSHINGS;  Surgeon: Collene Gobble, MD;  Location: [facility] ENDOSCOPY;  Service: Pulmonary;;   BRONCHIAL NEEDLE ASPIRATION BIOPSY  10/24/2019   Procedure: BRONCHIAL NEEDLE ASPIRATION BIOPSIES;  Surgeon: Collene Gobble, MD;  Location: [place];  Service: Cardiopulmonary;;   BRONCHIAL NEEDLE ASPIRATION BIOPSY  11/05/2019   Procedure: BRONCHIAL NEEDLE ASPIRATION BIOPSIES;  Surgeon: Collene Gobble, MD;  Location[facility] ENDOSCOPY;  Service: Pulmonary;;   ENDOBRONCHIAL ULTRASOUND  N/A 10/24/2019   Procedure: ENDOBRONCHIAL ULTRASOUND;  Surgeon: Collene Gobble, MD;  Location: [facility] [place];  Service: Cardiopulmonary;  Laterality: N/A;   FINGER SURGERY Right    Middle   IR IMAGING GUIDED PORT INSERTION  11/19/2019   VIDEO BRONCHOSCOPY N/A 10/24/2019   Procedure: VIDEO BRONCHOSCOPY WITHOUT FLUORO;  Surgeon: Collene Gobble, MD;  Location: [facility];  Service: Cardiopulmonary;  Laterality: N/A;   VIDEO BRONCHOSCOPY WITH ENDOBRONCHIAL NAVIGATION N/A 11/05/2019   Procedure: VIDEO BRONCHOSCOPY WITH ENDOBRONCHIAL NAVIGATION;  Surgeon: Collene Gobble, MD;  Location: [facility] ENDOSCOPY;  Service: Pulmonary;  Laterality: N/A;    REVIEW OF SYSTEMS:   Constitutional: Positive for stable fatigue. Negative for appetite change, chills, fever and unexpected weight change.  HENT: Negative for mouth sores, nosebleeds, sore throat and trouble swallowing.   Eyes: Negative for eye problems and icterus.  Respiratory: Positive for baseline dyspnea on exertion.  Positive for baseline cough.  Negative for hemoptysis and wheezing.   Cardiovascular: Negative  for chest pain and leg swelling.  Gastrointestinal: Negative for abdominal pain, constipation, diarrhea, nausea and vomiting.  Genitourinary: Negative for bladder incontinence, difficulty urinating, dysuria, frequency and hematuria.   Musculoskeletal: Negative for back pain, gait problem, neck pain and neck stiffness.  Skin: Positive for dry scattered skin lesions/plaques.  Neurological: Negative for dizziness, extremity weakness, gait problem, headaches, light-headedness and seizures.  Hematological: Negative for adenopathy. Does not bruise/bleed easily.  Psychiatric/Behavioral: Negative for confusion, depression and sleep disturbance. The patient is not nervous/anxious.      PHYSICAL EXAMINATION:  Blood pressure 128/76, pulse 88, temperature 98.1 F (36.7 C), temperature source Oral, resp. rate 17, height _0  (1.727 m), weight 182 lb 9.6 oz  (82.8 kg), SpO2 100 %.  ECOG PERFORMANCE STATUS: 1  Physical Exam  Constitutional: Oriented to person, place, and time and well-developed, well-nourished, and in no distress.  HENT:  Head: Normocephalic and atraumatic.  Mouth/Throat: Oropharynx is clear and moist. No oropharyngeal exudate.  Eyes: Conjunctivae are normal. Right eye exhibits no discharge. Left eye exhibits no discharge. No scleral icterus.  Neck: Normal range of motion. Neck supple.  Cardiovascular: Normal rate, regular rhythm, normal heart sounds and intact distal pulses.   Pulmonary/Chest: Effort normal and breath sounds normal. No respiratory distress. No wheezes. No rales.  Abdominal: Soft. Bowel sounds are normal. Exhibits no distension and no mass. There is no tenderness.  Musculoskeletal: Normal range of motion. Exhibits no edema.  Lymphadenopathy:    No cervical adenopathy.  Neurological: Alert and oriented to person, place, and time. Exhibits normal muscle tone. Gait normal. Coordination normal.  Skin: Positive for nodular raised skin lesions on his arms, back, chest, lower extremities.  Positive for bruising on upper extremities.  Skin is warm and dry. Not diaphoretic. No erythema. No pallor.  Psychiatric: Mood, memory and judgment normal.  Vitals reviewed.  LABORATORY DATA: Lab Results  Component Value Date   WBC 7.0 03/02/2022   HGB 13.0 03/02/2022   HCT 38.3 (L) 03/02/2022   MCV 89.3 03/02/2022   PLT 228 03/02/2022      Chemistry      Component Value Date/Time   NA 135 03/02/2022 0918   NA 141 03/08/2017 1707   K 4.0 03/02/2022 0918   CL 104 03/02/2022 0918   CO2 26 03/02/2022 0918   BUN 8 03/02/2022 0918   BUN 12 03/08/2017 1707   CREATININE 0.97 03/02/2022 0918      Component Value Date/Time   CALCIUM 8.9 03/02/2022 0918   ALKPHOS 65 03/02/2022 0918   AST 23 03/02/2022 0918   ALT 18 03/02/2022 0918   BILITOT 0.6 03/02/2022 0918       RADIOGRAPHIC STUDIES:  No results  found.   ASSESSMENT/PLAN:  This is a very pleasant 61 year old Caucasian male diagnosed with stage IV non-small cell lung cancer, adenocarcinoma.  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 He was diagnosed in July 2021. His molecular studies by [facility] show he has MSI high which is a good marker for response to immunotherapy which will be important for future treatment.    The patient will complete SRS to the solidary brain metastasis under the care of Dr. Lisbeth Renshaw later today on 11/14/19.    He then underwent a course of weekly concurrent chemoradiation with carboplatin for an AUC of 2 and paclitaxel 45 mg per metered squared.  He tolerated this treatment well except for fatigue and  mild odynophagia.  The patient showed evidence of disease progression with an increase in volume of the right infrahilar mass.  Since the patient has MSI high, the patient then underwent treatment with single agent immunotherapy with Keytruda 200 mg IV every 3 weeks.  The plan is to complete a total of 2 years unless the patient has unacceptable toxicity or disease progression.  The patient is status post 33 cycles.  He also received 7 cycles of Avastin for vasogenic edema in the brain.  Avastin has been on hold unless needed again in the future.    Labs were reviewed, recommend that he proceed with cycle #34 today as scheduled.    We will see him back for follow-up visit in 3 weeks for evaluation before starting cycle #35, which may be his last treatment pending on the results of his restaging CT scan.    For his itching, he was given two oral medications and a cream, which he will continue. He is requesting refill of kenalog cream today.   He will need a restaging CT scan of the CAP ordered at his next appointment with the estimated date about 3 weeks later and a follow up a few days later.   The patient was advised to call  immediately if she has any concerning symptoms in the interval. The patient voices understanding of current disease status and treatment options and is in agreement with the current care plan. All questions were answered. The patient knows to call the clinic with any problems, questions or concerns. We can certainly see the patient much sooner if necessary      No orders of the defined types were placed in this encounter.    The total time spent in the appointment was 20-29 minutes.   Hildegarde Dunaway L Miamarie Moll, PA-C 03/02/22

## 2022-03-02 ENCOUNTER — Inpatient Hospital Stay: Payer: Managed Care, Other (non HMO)

## 2022-03-02 ENCOUNTER — Other Ambulatory Visit: Payer: Self-pay

## 2022-03-02 ENCOUNTER — Inpatient Hospital Stay: Payer: Managed Care, Other (non HMO) | Attending: Internal Medicine | Admitting: Physician Assistant

## 2022-03-02 VITALS — BP 128/76 | HR 88 | Temp 98.1°F | Resp 17 | Ht 68.0 in | Wt 182.6 lb

## 2022-03-02 DIAGNOSIS — Z79890 Hormone replacement therapy (postmenopausal): Secondary | ICD-10-CM | POA: Diagnosis not present

## 2022-03-02 DIAGNOSIS — K219 Gastro-esophageal reflux disease without esophagitis: Secondary | ICD-10-CM | POA: Diagnosis not present

## 2022-03-02 DIAGNOSIS — Z7951 Long term (current) use of inhaled steroids: Secondary | ICD-10-CM | POA: Diagnosis not present

## 2022-03-02 DIAGNOSIS — Z79899 Other long term (current) drug therapy: Secondary | ICD-10-CM | POA: Insufficient documentation

## 2022-03-02 DIAGNOSIS — I10 Essential (primary) hypertension: Secondary | ICD-10-CM | POA: Insufficient documentation

## 2022-03-02 DIAGNOSIS — E039 Hypothyroidism, unspecified: Secondary | ICD-10-CM | POA: Diagnosis not present

## 2022-03-02 DIAGNOSIS — E785 Hyperlipidemia, unspecified: Secondary | ICD-10-CM | POA: Diagnosis not present

## 2022-03-02 DIAGNOSIS — C7931 Secondary malignant neoplasm of brain: Secondary | ICD-10-CM | POA: Insufficient documentation

## 2022-03-02 DIAGNOSIS — C3491 Malignant neoplasm of unspecified part of right bronchus or lung: Secondary | ICD-10-CM

## 2022-03-02 DIAGNOSIS — C3431 Malignant neoplasm of lower lobe, right bronchus or lung: Secondary | ICD-10-CM | POA: Diagnosis present

## 2022-03-02 DIAGNOSIS — R1310 Dysphagia, unspecified: Secondary | ICD-10-CM | POA: Diagnosis not present

## 2022-03-02 DIAGNOSIS — Z5112 Encounter for antineoplastic immunotherapy: Secondary | ICD-10-CM

## 2022-03-02 DIAGNOSIS — L299 Pruritus, unspecified: Secondary | ICD-10-CM | POA: Diagnosis not present

## 2022-03-02 DIAGNOSIS — Z7982 Long term (current) use of aspirin: Secondary | ICD-10-CM | POA: Insufficient documentation

## 2022-03-02 DIAGNOSIS — F1010 Alcohol abuse, uncomplicated: Secondary | ICD-10-CM | POA: Diagnosis not present

## 2022-03-02 DIAGNOSIS — Z95828 Presence of other vascular implants and grafts: Secondary | ICD-10-CM

## 2022-03-02 DIAGNOSIS — G936 Cerebral edema: Secondary | ICD-10-CM | POA: Insufficient documentation

## 2022-03-02 LAB — CBC WITH DIFFERENTIAL (CANCER CENTER ONLY)
Abs Immature Granulocytes: 0.02 10*3/uL (ref 0.00–0.07)
Basophils Absolute: 0.1 10*3/uL (ref 0.0–0.1)
Basophils Relative: 1 %
Eosinophils Absolute: 0.3 10*3/uL (ref 0.0–0.5)
Eosinophils Relative: 5 %
HCT: 38.3 % — ABNORMAL LOW (ref 39.0–52.0)
Hemoglobin: 13 g/dL (ref 13.0–17.0)
Immature Granulocytes: 0 %
Lymphocytes Relative: 18 %
Lymphs Abs: 1.2 10*3/uL (ref 0.7–4.0)
MCH: 30.3 pg (ref 26.0–34.0)
MCHC: 33.9 g/dL (ref 30.0–36.0)
MCV: 89.3 fL (ref 80.0–100.0)
Monocytes Absolute: 0.7 10*3/uL (ref 0.1–1.0)
Monocytes Relative: 10 %
Neutro Abs: 4.7 10*3/uL (ref 1.7–7.7)
Neutrophils Relative %: 66 %
Platelet Count: 228 10*3/uL (ref 150–400)
RBC: 4.29 MIL/uL (ref 4.22–5.81)
RDW: 13.4 % (ref 11.5–15.5)
WBC Count: 7 10*3/uL (ref 4.0–10.5)
nRBC: 0 % (ref 0.0–0.2)

## 2022-03-02 LAB — CMP (CANCER CENTER ONLY)
ALT: 18 U/L (ref 0–44)
AST: 23 U/L (ref 15–41)
Albumin: 4.1 g/dL (ref 3.5–5.0)
Alkaline Phosphatase: 65 U/L (ref 38–126)
Anion gap: 5 (ref 5–15)
BUN: 8 mg/dL (ref 6–20)
CO2: 26 mmol/L (ref 22–32)
Calcium: 8.9 mg/dL (ref 8.9–10.3)
Chloride: 104 mmol/L (ref 98–111)
Creatinine: 0.97 mg/dL (ref 0.61–1.24)
GFR, Estimated: >60 mL/min (ref >60)
Glucose, Bld: 122 mg/dL — ABNORMAL HIGH (ref 70–99)
Potassium: 4 mmol/L (ref 3.5–5.1)
Sodium: 135 mmol/L (ref 135–145)
Total Bilirubin: 0.6 mg/dL (ref 0.3–1.2)
Total Protein: 6.6 g/dL (ref 6.5–8.1)

## 2022-03-02 LAB — TSH: TSH: 4.125 u[IU]/mL (ref 0.350–4.500)

## 2022-03-02 MED ORDER — TRIAMCINOLONE ACETONIDE 0.1 % EX CREA: TOPICAL_CREAM | CUTANEOUS | 0 refills | Status: DC

## 2022-03-02 MED ORDER — SODIUM CHLORIDE 0.9 % IV SOLN: Freq: Once | INTRAVENOUS | Status: AC

## 2022-03-02 MED ORDER — SODIUM CHLORIDE 0.9% FLUSH: 10.0000 mL | INTRAVENOUS | Status: DC | PRN

## 2022-03-02 MED ORDER — SODIUM CHLORIDE 0.9% FLUSH
10.0000 mL | INTRAVENOUS | Status: DC | PRN
  Administered 2022-03-02: 10 mL

## 2022-03-02 MED ORDER — HEPARIN SOD (PORK) LOCK FLUSH 100 UNIT/ML IV SOLN
500.0000 [IU] | Freq: Once | INTRAVENOUS | Status: AC | PRN
  Administered 2022-03-02: 500 [IU]

## 2022-03-02 MED ORDER — SODIUM CHLORIDE 0.9 % IV SOLN
200.0000 mg | Freq: Once | INTRAVENOUS | Status: AC
  Administered 2022-03-02: 200 mg via INTRAVENOUS
  Filled 2022-03-02: qty 200

## 2022-03-07 ENCOUNTER — Telehealth: Payer: Self-pay | Admitting: Physician Assistant

## 2022-03-07 NOTE — Telephone Encounter (Signed)
Scheduled per 11/07 los, patient has been called and notified of upcoming appointment.

## 2022-03-18 NOTE — Progress Notes (Signed)
[facility] OFFICE PROGRESS NOTE  [name], NP [address] [name] [address] [place] [address]  DIAGNOSIS: Stage IV carcinoma, non-small cell lung cancer, adenocarcinoma.  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 He was diagnosed in July 2021.   Molecular Biomarkers:  MSI-High DETECTED Pembrolizumab Atezolizumab, Avelumab, Cemiplimab, Dostarlimab, Durvalumab, Ipilimumab, Nivolumab   STK11Splice Site SNV 3.7% Everolimus, Temsirolimus Yes   KRASG12D 1.7% Binimetinib Yes   [id number] 0.4%   Niraparib, Olaparib, Rucaparib, Talazoparib, Tazemetostat Yes  PRIOR THERAPY: 1) ) SRS to the solitary brain metastasis under the care of Dr. Lisbeth Renshaw. Last treatment 7/22/212)  2) Weekly concurrent chemoradiation with carboplatin for an AUC of 2, paclitaxel 45 mg/m2.  First dose expected on 11/25/2019. Status post 7 cycles, last dose was giving 01/06/2020 with partial response 3) Avastin 15 mg/KG every 3 weeks.  For the vasogenic edema of the brain.S/P 7 cycles.  CURRENT THERAPY: 1)  Immunotherapy with Keytruda 200 mg IV every 3 weeks.  First dose February 10, 2020 for a patient with MSI high.  Status post 34 cycles.   INTERVAL HISTORY: Joseph Atkins 61 y.o. male returns to the clinic today for a follow-up visit accompanied by his wife.  The patient is tolerating single agent immunotherapy with Keytruda well without any new concerning adverse side effects except he was referred to dermatology for pruritic nodular skin lesions on his arm, chest, back, and lower extremity.  He uses kenalog cream and zyrtec. These are still very itchy and he is scratching his skin to the point he has broken the skin on his legs and has bruising on his arm. He is also on 81 mg aspirin. He does not like atarax due to it making him too drowsy the following morning.   Otherwise the patient is feeling fine.  The patient denies any fever, chills, night sweats, or weight loss.  He reports stable dyspnea on exertion. He has a baseline cough which produces clear phelgm. This may be mildly worse recently which he attributes to the "weather change". Denies hemoptysis or chest pain. He denies any nausea, vomiting, constipation ,or diarrhea. He follows closely with neuro-oncology for his history of metastatic disease to the brain. The patient is here today for evaluation and repeat blood work before undergoing cycle #35.     MEDICAL HISTORY: Past Medical History:  Diagnosis Date   Cancer ([place])    lung cancer   Diverticulosis    GERD (gastroesophageal reflux disease)    Hx of small bowel obstruction    Hyperlipidemia    Hypertension    Hypothyroidism    IBS (irritable bowel syndrome)    Substance abuse ([place])    Alcoholic, Drug addition   Thyroid disease     ALLERGIES:  is allergic to penicillins.  MEDICATIONS:  Current Outpatient Medications  Medication Sig Dispense Refill   albuterol (PROVENTIL HFA;VENTOLIN HFA) 108 (90 Base) MCG/ACT inhaler Inhale 2 puffs into the lungs every 6 (six) hours as needed for wheezing or shortness of breath. 1 Inhaler 0   amLODipine (NORVASC) 5 MG tablet TAKE 1 TABLET (5 MG TOTAL) BY MOUTH DAILY. 90 tablet 1   ANORO ELLIPTA 62.5-25 MCG/INH AEPB 1 puff daily.     augmented betamethasone dipropionate (DIPROLENE-AF) 0.05 % cream Apply topically 2 (two) times daily.     [facility] ASPIRIN LOW DOSE 81 MG tablet TAKE 1 TABLET (81 MG TOTAL) BY  MOUTH DAILY. SWALLOW WHOLE. 90 tablet 3   cyclobenzaprine (FLEXERIL) 10 MG tablet Take 10 mg by mouth 2 (two) times daily as needed for muscle spasms.     docusate sodium (COLACE) 100 MG capsule Take 100 mg by mouth daily.     DULoxetine (CYMBALTA) 20 MG capsule Take 1 capsule (20 mg total) by mouth 2 (two) times daily. 60 capsule 5   fluticasone furoate-vilanterol (BREO ELLIPTA) 100-25 MCG/INH AEPB Inhale 1 puff into the lungs daily. 30  each 3   hydrOXYzine (ATARAX) 25 MG tablet Take 25 mg by mouth at bedtime.     levothyroxine (SYNTHROID) 100 MCG tablet TAKE 1 TABLET BY MOUTH EVERY DAY 90 tablet 1   lidocaine-prilocaine (EMLA) cream Apply 1 application topically as needed. 30 g 0   omeprazole (PRILOSEC) 40 MG capsule Take 1 capsule (40 mg total) by mouth daily. 90 capsule 4   ondansetron (ZOFRAN) 4 MG tablet Take 1 tablet (4 mg total) by mouth every 8 (eight) hours as needed. 40 tablet 2   oxyCODONE-acetaminophen (PERCOCET/ROXICET) 5-325 MG tablet Take 1 tablet by mouth every 4 (four) hours as needed for severe pain. 30 tablet 0   pembrolizumab (KEYTRUDA) 100 MG/4ML SOLN See admin instructions.     polyethylene glycol powder (MIRALAX) powder Take 17 g by mouth daily. 255 g 11   prochlorperazine (COMPAZINE) 10 MG tablet Take 1 tablet (10 mg total) by mouth every 6 (six) hours as needed. 30 tablet 2   triamcinolone cream (KENALOG) 0.1 % APPLY TOPICALLY 2 TIMES DAILY AS NEEDED. 454 g 0   TRULANCE 3 MG TABS Take 1 tablet by mouth daily.     zolpidem (AMBIEN CR) 12.5 MG CR tablet Take 1 tablet (12.5 mg total) by mouth at bedtime as needed for sleep. 30 tablet 5   No current facility-administered medications for this visit.    SURGICAL HISTORY:  Past Surgical History:  Procedure Laterality Date   arm surgery Right    BRONCHIAL BRUSHINGS  10/24/2019   Procedure: BRONCHIAL BRUSHINGS;  Surgeon: Collene Gobble, MD;  Location: [facility] ENDOSCOPY;  Service: Cardiopulmonary;;  right lower lobe    BRONCHIAL BRUSHINGS  11/05/2019   Procedure: BRONCHIAL BRUSHINGS;  Surgeon: Collene Gobble, MD;  Location: [facility] ENDOSCOPY;  Service: Pulmonary;;   BRONCHIAL NEEDLE ASPIRATION BIOPSY  10/24/2019   Procedure: BRONCHIAL NEEDLE ASPIRATION BIOPSIES;  Surgeon: Collene Gobble, MD;  Location: [facility];  Service: Cardiopulmonary;;   BRONCHIAL NEEDLE ASPIRATION BIOPSY  11/05/2019   Procedure: BRONCHIAL NEEDLE ASPIRATION BIOPSIES;  Surgeon: Collene Gobble,  MD;  Location: [facility] ENDOSCOPY;  Service: Pulmonary;;   ENDOBRONCHIAL ULTRASOUND N/A 10/24/2019   Procedure: ENDOBRONCHIAL ULTRASOUND;  Surgeon: Collene Gobble, MD;  Location: [facility];  Service: Cardiopulmonary;  Laterality: N/A;   FINGER SURGERY Right    Middle   IR IMAGING GUIDED PORT INSERTION  11/19/2019   VIDEO BRONCHOSCOPY N/A 10/24/2019   Procedure: VIDEO BRONCHOSCOPY WITHOUT FLUORO;  Surgeon: Collene Gobble, MD;  Location: [facility];  Service: Cardiopulmonary;  Laterality: N/A;   VIDEO BRONCHOSCOPY WITH ENDOBRONCHIAL NAVIGATION N/A 11/05/2019   Procedure: VIDEO BRONCHOSCOPY WITH ENDOBRONCHIAL NAVIGATION;  Surgeon: Collene Gobble, MD;  Location: [facility] ENDOSCOPY;  Service: Pulmonary;  Laterality: N/A;    REVIEW OF SYSTEMS:   Constitutional: Positive for stable fatigue. Negative for appetite change, chills, fever and unexpected weight change.  HENT: Negative for mouth sores, nosebleeds, sore throat and trouble swallowing.   Eyes: Negative for eye problems and icterus.  Respiratory: Positive for baseline dyspnea on exertion.  Positive for baseline cough mildly worsened.  Negative for hemoptysis and wheezing.   Cardiovascular: Negative for chest pain and leg swelling.  Gastrointestinal: Negative for abdominal pain, constipation, diarrhea, nausea and vomiting.  Genitourinary: Negative for bladder incontinence, difficulty urinating, dysuria, frequency and hematuria.   Musculoskeletal: Negative for back pain, gait problem, neck pain and neck stiffness.  Skin: Positive for dry scattered skin lesions/plaques.  Neurological: Negative for dizziness, extremity weakness, gait problem, headaches, light-headedness and seizures.  Hematological: Negative for adenopathy. Does not bruise/bleed easily.  Psychiatric/Behavioral: Negative for confusion, depression and sleep disturbance. The patient is not nervous/anxious   PHYSICAL EXAMINATION:  Blood pressure (!) 144/86, pulse 84, temperature 98.3 F  (36.8 C), temperature source Oral, resp. rate 17, weight 186 lb 11.2 oz (84.7 kg), SpO2 100 %.  ECOG PERFORMANCE STATUS: 1  Physical Exam  Constitutional: Oriented to person, place, and time and well-developed, well-nourished, and in no distress.  HENT:  Head: Normocephalic and atraumatic.  Mouth/Throat: Oropharynx is clear and moist. No oropharyngeal exudate.  Eyes: Conjunctivae are normal. Right eye exhibits no discharge. Left eye exhibits no discharge. No scleral icterus.  Neck: Normal range of motion. Neck supple.  Cardiovascular: Normal rate, regular rhythm, normal heart sounds and intact distal pulses.   Pulmonary/Chest: Effort normal and breath sounds normal. No respiratory distress. No wheezes. No rales.  Abdominal: Soft. Bowel sounds are normal. Exhibits no distension and no mass. There is no tenderness.  Musculoskeletal: Normal range of motion. Exhibits no edema.  Lymphadenopathy:    No cervical adenopathy.  Neurological: Alert and oriented to person, place, and time. Exhibits normal muscle tone. Gait normal. Coordination normal.  Skin: Positive for nodular raised skin lesions on his arms, back, chest, lower extremities.  Positive for bruising on upper extremities.  Skin is warm and dry. Not diaphoretic. No erythema. No pallor.  Psychiatric: Mood, memory and judgment normal.  Vitals reviewed.  LABORATORY DATA: Lab Results  Component Value Date   WBC 7.5 03/23/2022   HGB 13.0 03/23/2022   HCT 39.7 03/23/2022   MCV 91.9 03/23/2022   PLT 217 03/23/2022      Chemistry      Component Value Date/Time   NA 135 03/23/2022 0908   NA 141 03/08/2017 1707   K 4.0 03/23/2022 0908   CL 104 03/23/2022 0908   CO2 26 03/23/2022 0908   BUN 8 03/23/2022 0908   BUN 12 03/08/2017 1707   CREATININE 0.96 03/23/2022 0908      Component Value Date/Time   CALCIUM 9.3 03/23/2022 0908   ALKPHOS 64 03/23/2022 0908   AST 20 03/23/2022 0908   ALT 17 03/23/2022 0908   BILITOT 0.6  03/23/2022 0908       RADIOGRAPHIC STUDIES:  No results found.   ASSESSMENT/PLAN:  This is a very pleasant 61 year old Caucasian male diagnosed with stage IV non-small cell lung cancer, adenocarcinoma.  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 He was diagnosed in July 2021. His molecular studies by [facility] show he has MSI high which is a good marker for response to immunotherapy which will be important for future treatment.    The patient will complete SRS to the solidary brain metastasis under the care of Dr. Lisbeth Renshaw later today on 11/14/19.    He then underwent a course of weekly concurrent chemoradiation with carboplatin for an AUC of 2 and  paclitaxel 45 mg per metered squared.  He tolerated this treatment well except for fatigue and mild odynophagia.  The patient showed evidence of disease progression with an increase in volume of the right infrahilar mass.  Since the patient has MSI high, the patient then underwent treatment with single agent immunotherapy with Keytruda 200 mg IV every 3 weeks.  The plan is to complete a total of 2 years unless the patient has unacceptable toxicity or disease progression.  The patient is status post 34 cycles.  He also received 7 cycles of Avastin for vasogenic edema in the brain.  Avastin has been on hold unless needed again in the future.    Labs were reviewed, recommend that he proceed with cycle #35 today as scheduled.    For his itching, in addition to his steriod cream, also advised to moisturize after showering with urea cream. Also may consider taking 1/2 a benadryl at night so that he does not itch his skin in his sleep.    I will arrange for a restaging CT scan of the CAP. We will see him back a few days after his CT scan to review the results and for a more detailed discussion about the next steps in his care based on the scan results.   The patient was  advised to call immediately if she has any concerning symptoms in the interval. The patient voices understanding of current disease status and treatment options and is in agreement with the current care plan. All questions were answered. The patient knows to call the clinic with any problems, questions or concerns. We can certainly see the patient much sooner if necessary    Orders Placed This Encounter  Procedures   CT Chest W Contrast    Standing Status:   Future    Standing Expiration Date:   03/23/2023    Order Specific Question:   If indicated for the ordered procedure, I authorize the administration of contrast media per Radiology protocol    Answer:   Yes    Order Specific Question:   Does the patient have a contrast media/X-ray dye allergy?    Answer:   No    Order Specific Question:   Preferred imaging location?    Answer:   [facility]   CT Abdomen Pelvis W Contrast    Standing Status:   Future    Standing Expiration Date:   03/23/2023    Order Specific Question:   If indicated for the ordered procedure, I authorize the administration of contrast media per Radiology protocol    Answer:   Yes    Order Specific Question:   Does the patient have a contrast media/X-ray dye allergy?    Answer:   No    Order Specific Question:   Preferred imaging location?    Answer:   [facility]    Order Specific Question:   Is Oral Contrast requested for this exam?    Answer:   Yes, Per Radiology protocol     The total time spent in the appointment was 20-29 minutes.   Jolee Critcher L Demarrio Menges, PA-C 03/23/22

## 2022-03-23 ENCOUNTER — Inpatient Hospital Stay: Payer: Managed Care, Other (non HMO)

## 2022-03-23 ENCOUNTER — Other Ambulatory Visit: Payer: Self-pay | Admitting: Internal Medicine

## 2022-03-23 ENCOUNTER — Other Ambulatory Visit: Payer: Managed Care, Other (non HMO)

## 2022-03-23 ENCOUNTER — Ambulatory Visit: Payer: Managed Care, Other (non HMO) | Admitting: Internal Medicine

## 2022-03-23 ENCOUNTER — Inpatient Hospital Stay (HOSPITAL_BASED_OUTPATIENT_CLINIC_OR_DEPARTMENT_OTHER): Payer: Managed Care, Other (non HMO) | Admitting: Physician Assistant

## 2022-03-23 ENCOUNTER — Other Ambulatory Visit: Payer: Self-pay

## 2022-03-23 ENCOUNTER — Ambulatory Visit: Payer: Managed Care, Other (non HMO)

## 2022-03-23 VITALS — BP 144/86 | HR 84 | Temp 98.3°F | Resp 17 | Wt 186.7 lb

## 2022-03-23 DIAGNOSIS — C3491 Malignant neoplasm of unspecified part of right bronchus or lung: Secondary | ICD-10-CM

## 2022-03-23 DIAGNOSIS — Z5112 Encounter for antineoplastic immunotherapy: Secondary | ICD-10-CM

## 2022-03-23 DIAGNOSIS — Z95828 Presence of other vascular implants and grafts: Secondary | ICD-10-CM

## 2022-03-23 DIAGNOSIS — C3431 Malignant neoplasm of lower lobe, right bronchus or lung: Secondary | ICD-10-CM | POA: Diagnosis not present

## 2022-03-23 LAB — CBC WITH DIFFERENTIAL (CANCER CENTER ONLY)
Abs Immature Granulocytes: 0.03 10*3/uL (ref 0.00–0.07)
Basophils Absolute: 0.1 10*3/uL (ref 0.0–0.1)
Basophils Relative: 1 %
Eosinophils Absolute: 0.3 10*3/uL (ref 0.0–0.5)
Eosinophils Relative: 4 %
HCT: 39.7 % (ref 39.0–52.0)
Hemoglobin: 13 g/dL (ref 13.0–17.0)
Immature Granulocytes: 0 %
Lymphocytes Relative: 16 %
Lymphs Abs: 1.2 10*3/uL (ref 0.7–4.0)
MCH: 30.1 pg (ref 26.0–34.0)
MCHC: 32.7 g/dL (ref 30.0–36.0)
MCV: 91.9 fL (ref 80.0–100.0)
Monocytes Absolute: 0.7 10*3/uL (ref 0.1–1.0)
Monocytes Relative: 9 %
Neutro Abs: 5.2 10*3/uL (ref 1.7–7.7)
Neutrophils Relative %: 70 %
Platelet Count: 217 10*3/uL (ref 150–400)
RBC: 4.32 MIL/uL (ref 4.22–5.81)
RDW: 13.2 % (ref 11.5–15.5)
WBC Count: 7.5 10*3/uL (ref 4.0–10.5)
nRBC: 0 % (ref 0.0–0.2)

## 2022-03-23 LAB — CMP (CANCER CENTER ONLY)
ALT: 17 U/L (ref 0–44)
AST: 20 U/L (ref 15–41)
Albumin: 4.4 g/dL (ref 3.5–5.0)
Alkaline Phosphatase: 64 U/L (ref 38–126)
Anion gap: 5 (ref 5–15)
BUN: 8 mg/dL (ref 6–20)
CO2: 26 mmol/L (ref 22–32)
Calcium: 9.3 mg/dL (ref 8.9–10.3)
Chloride: 104 mmol/L (ref 98–111)
Creatinine: 0.96 mg/dL (ref 0.61–1.24)
GFR, Estimated: >60 mL/min (ref >60)
Glucose, Bld: 126 mg/dL — ABNORMAL HIGH (ref 70–99)
Potassium: 4 mmol/L (ref 3.5–5.1)
Sodium: 135 mmol/L (ref 135–145)
Total Bilirubin: 0.6 mg/dL (ref 0.3–1.2)
Total Protein: 7.1 g/dL (ref 6.5–8.1)

## 2022-03-23 LAB — TSH: TSH: 4.502 u[IU]/mL — ABNORMAL HIGH (ref 0.350–4.500)

## 2022-03-23 MED ORDER — SODIUM CHLORIDE 0.9% FLUSH
10.0000 mL | INTRAVENOUS | Status: DC | PRN
  Administered 2022-03-23: 10 mL

## 2022-03-23 MED ORDER — HEPARIN SOD (PORK) LOCK FLUSH 100 UNIT/ML IV SOLN
500.0000 [IU] | Freq: Once | INTRAVENOUS | Status: AC | PRN
  Administered 2022-03-23: 500 [IU]

## 2022-03-23 MED ORDER — SODIUM CHLORIDE 0.9 % IV SOLN
200.0000 mg | Freq: Once | INTRAVENOUS | Status: AC
  Administered 2022-03-23: 200 mg via INTRAVENOUS
  Filled 2022-03-23: qty 200

## 2022-03-23 MED ORDER — SODIUM CHLORIDE 0.9 % IV SOLN: Freq: Once | INTRAVENOUS | Status: AC

## 2022-04-07 ENCOUNTER — Telehealth: Payer: Self-pay | Admitting: Internal Medicine

## 2022-04-07 NOTE — Telephone Encounter (Signed)
Rescheduled 12/21 appointment due to providers request, called and left a voicemail.

## 2022-04-11 ENCOUNTER — Ambulatory Visit (HOSPITAL_COMMUNITY): Admission: RE | Admit: 2022-04-11 | Payer: Managed Care, Other (non HMO) | Source: Ambulatory Visit

## 2022-04-11 ENCOUNTER — Encounter (HOSPITAL_COMMUNITY): Payer: Self-pay

## 2022-04-13 ENCOUNTER — Inpatient Hospital Stay: Payer: Managed Care, Other (non HMO)

## 2022-04-13 ENCOUNTER — Inpatient Hospital Stay: Payer: Managed Care, Other (non HMO) | Admitting: Physician Assistant

## 2022-04-14 ENCOUNTER — Ambulatory Visit: Payer: Managed Care, Other (non HMO) | Admitting: Physician Assistant

## 2022-04-14 ENCOUNTER — Other Ambulatory Visit: Payer: Managed Care, Other (non HMO)

## 2022-04-27 ENCOUNTER — Telehealth: Payer: Self-pay | Admitting: Physician Assistant

## 2022-04-27 NOTE — Telephone Encounter (Signed)
Patient's spouse called to r/s missed appointments. R/s and patient will be notified.

## 2022-05-04 ENCOUNTER — Ambulatory Visit (HOSPITAL_COMMUNITY)
Admission: RE | Admit: 2022-05-04 | Discharge: 2022-05-04 | Disposition: A | Discharge status: Home / Self Care | Payer: Managed Care, Other (non HMO) | Source: Ambulatory Visit | Attending: Physician Assistant | Admitting: Physician Assistant

## 2022-05-04 ENCOUNTER — Encounter: Payer: Self-pay | Admitting: Internal Medicine

## 2022-05-04 ENCOUNTER — Encounter: Payer: Self-pay | Admitting: Physician Assistant

## 2022-05-04 DIAGNOSIS — C3491 Malignant neoplasm of unspecified part of right bronchus or lung: Secondary | ICD-10-CM | POA: Diagnosis not present

## 2022-05-04 MED ORDER — HEPARIN SOD (PORK) LOCK FLUSH 100 UNIT/ML IV SOLN
500.0000 [IU] | Freq: Once | INTRAVENOUS | Status: AC
  Administered 2022-05-04: 500 [IU] via INTRAVENOUS

## 2022-05-04 MED ORDER — SODIUM CHLORIDE (PF) 0.9 % IJ SOLN
INTRAMUSCULAR | Status: AC
  Filled 2022-05-04: qty 50

## 2022-05-04 MED ORDER — HEPARIN SOD (PORK) LOCK FLUSH 100 UNIT/ML IV SOLN
INTRAVENOUS | Status: AC
  Filled 2022-05-04: qty 5

## 2022-05-04 MED ORDER — IOHEXOL 300 MG/ML  SOLN
100.0000 mL | Freq: Once | INTRAMUSCULAR | Status: AC | PRN
  Administered 2022-05-04: 100 mL via INTRAVENOUS

## 2022-05-06 NOTE — Progress Notes (Signed)
[facility] OFFICE PROGRESS NOTE  [name], NP [address] [name] [address] [place] [address]  DIAGNOSIS: Stage IV carcinoma, non-small cell lung cancer, adenocarcinoma.  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 He was diagnosed in July 2021.   Molecular Biomarkers:  MSI-High DETECTED Pembrolizumab Atezolizumab, Avelumab, Cemiplimab, Dostarlimab, Durvalumab, Ipilimumab, Nivolumab   STK11Splice Site SNV 1.9% Everolimus, Temsirolimus Yes   [id number] 1.7% Binimetinib Yes   [id number] 0.4%   Niraparib, Olaparib, Rucaparib, Talazoparib, Tazemetostat Yes    PRIOR THERAPY: 1)  SRS to the solitary brain metastasis under the care of Dr. [name]. Last treatment 7/22/212)  2) Weekly concurrent chemoradiation with carboplatin for an AUC of 2, paclitaxel 45 mg/m2.  First dose expected on 11/25/2019. Status post 7 cycles, last dose was giving 01/06/2020 with partial response 3) Avastin 15 mg/KG every 3 weeks.  For the vasogenic edema of the brain.S/P 7 cycles. 4) Immunotherapy with Keytruda 200 mg IV every 3 weeks.  First dose February 10, 2020 for a patient with MSI high.  Status post 35 cycles. Last dose 03/23/22    CURRENT THERAPY:Observation   INTERVAL HISTORY: Joseph Atkins 62 y.o. male returns to the clinic today for a follow-up visit accompanied by his wife.  The patient completed 2 years of immunotherapy with Keytruda at his last appointment on 03/23/22.  He overall tolerated treatment well except for skin rash and pruritic nodular lesions on his body for which she uses Kenalog cream and Zyrtec.    Otherwise he is feeling fine today.  Since last being seen, the patient reports he had an upper respiratory infection with associated chills.  He received antibiotics and is feeling better at this time.  Presently, he denies any fever, chills, night sweats, or unexplained weight  loss.  He reports stable dyspnea on exertion and stable baseline cough which produces phlegm.  He denies any hemoptysis or chest pain.  Denies any nausea, vomiting, diarrhea, or constipation.  Denies any headache or visual changes.  He follows closely with oncology for his history of metastatic disease to the brain.  The patient recently had a restaging CT scan performed.  He is here today for evaluation to review his scan results and for a more detailed discussion about his nextsteps in his care.      MEDICAL HISTORY: Past Medical History:  Diagnosis Date   Cancer (HCC)    lung cancer   Diverticulosis    GERD (gastroesophageal reflux disease)    Hx of small bowel obstruction    Hyperlipidemia    Hypertension    Hypothyroidism    IBS (irritable bowel syndrome)    Substance abuse (HCC)    Alcoholic, Drug addition   Thyroid disease     ALLERGIES:  is allergic to penicillins.  MEDICATIONS:  Current Outpatient Medications  Medication Sig Dispense Refill   albuterol (PROVENTIL HFA;VENTOLIN HFA) 108 (90 Base) MCG/ACT inhaler Inhale 2 puffs into the lungs every 6 (six) hours as needed for wheezing or shortness of breath. 1 Inhaler 0   amLODipine (NORVASC) 5 MG tablet TAKE 1 TABLET (5 MG TOTAL) BY MOUTH DAILY. 90 tablet 1   ANORO ELLIPTA 62.5-25 MCG/INH AEPB 1 puff daily.     augmented betamethasone dipropionate (DIPROLENE-AF) 0.05 % cream Apply topically 2 (two) times daily.     [facility] ASPIRIN LOW DOSE 81 MG tablet TAKE 1 TABLET (81 MG TOTAL) BY  MOUTH DAILY. SWALLOW WHOLE. 90 tablet 3   cyclobenzaprine (FLEXERIL) 10 MG tablet Take 10 mg by mouth 2 (two) times daily as needed for muscle spasms.     docusate sodium (COLACE) 100 MG capsule Take 100 mg by mouth daily.     DULoxetine (CYMBALTA) 20 MG capsule Take 1 capsule (20 mg total) by mouth 2 (two) times daily. 60 capsule 5   fluticasone furoate-vilanterol (BREO ELLIPTA) 100-25 MCG/INH AEPB Inhale 1 puff into the lungs daily. 30 each 3    hydrOXYzine (ATARAX) 25 MG tablet Take 25 mg by mouth at bedtime.     levothyroxine (SYNTHROID) 100 MCG tablet TAKE 1 TABLET BY MOUTH EVERY DAY 90 tablet 1   lidocaine-prilocaine (EMLA) cream Apply 1 application topically as needed. 30 g 0   omeprazole (PRILOSEC) 40 MG capsule Take 1 capsule (40 mg total) by mouth daily. 90 capsule 4   ondansetron (ZOFRAN) 4 MG tablet Take 1 tablet (4 mg total) by mouth every 8 (eight) hours as needed. 40 tablet 2   oxyCODONE-acetaminophen (PERCOCET/ROXICET) 5-325 MG tablet Take 1 tablet by mouth every 4 (four) hours as needed for severe pain. 30 tablet 0   pembrolizumab (KEYTRUDA) 100 MG/4ML SOLN See admin instructions.     polyethylene glycol powder (MIRALAX) powder Take 17 g by mouth daily. 255 g 11   prochlorperazine (COMPAZINE) 10 MG tablet Take 1 tablet (10 mg total) by mouth every 6 (six) hours as needed. 30 tablet 2   triamcinolone cream (KENALOG) 0.1 % APPLY TOPICALLY 2 TIMES DAILY AS NEEDED. 454 g 0   TRULANCE 3 MG TABS Take 1 tablet by mouth daily.     zolpidem (AMBIEN CR) 12.5 MG CR tablet Take 1 tablet (12.5 mg total) by mouth at bedtime as needed for sleep. 30 tablet 5   No current facility-administered medications for this visit.    SURGICAL HISTORY:  Past Surgical History:  Procedure Laterality Date   arm surgery Right    BRONCHIAL BRUSHINGS  10/24/2019   Procedure: BRONCHIAL BRUSHINGS;  Surgeon: Leslye Peer, MD;  Location: [facility] ENDOSCOPY;  Service: Cardiopulmonary;;  right lower lobe    BRONCHIAL BRUSHINGS  11/05/2019   Procedure: BRONCHIAL BRUSHINGS;  Surgeon: Leslye Peer, MD;  Location: [facility] ENDOSCOPY;  Service: Pulmonary;;   BRONCHIAL NEEDLE ASPIRATION BIOPSY  10/24/2019   Procedure: BRONCHIAL NEEDLE ASPIRATION BIOPSIES;  Surgeon: Leslye Peer, MD;  Location: [facility] ENDOSCOPY;  Service: Cardiopulmonary;;   BRONCHIAL NEEDLE ASPIRATION BIOPSY  11/05/2019   Procedure: BRONCHIAL NEEDLE ASPIRATION BIOPSIES;  Surgeon: Leslye Peer, MD;   Location[facility] ENDOSCOPY;  Service: Pulmonary;;   ENDOBRONCHIAL ULTRASOUND N/A 10/24/2019   Procedure: ENDOBRONCHIAL ULTRASOUND;  Surgeon: Leslye Peer, MD;  Location: [facility] ENDOSCOPY;  Service: Cardiopulmonary;  Laterality: N/A;   FINGER SURGERY Right    Middle   IR IMAGING GUIDED PORT INSERTION  11/19/2019   VIDEO BRONCHOSCOPY N/A 10/24/2019   Procedure: VIDEO BRONCHOSCOPY WITHOUT FLUORO;  Surgeon: Leslye Peer, MD;  Location: [facility] ENDOSCOPY;  Service: Cardiopulmonary;  Laterality: N/A;   VIDEO BRONCHOSCOPY WITH ENDOBRONCHIAL NAVIGATION N/A 11/05/2019   Procedure: VIDEO BRONCHOSCOPY WITH ENDOBRONCHIAL NAVIGATION;  Surgeon: Leslye Peer, MD;  Location: [facility] ENDOSCOPY;  Service: Pulmonary;  Laterality: N/A;    REVIEW OF SYSTEMS:   Constitutional: Positive for stable fatigue. Negative for appetite change, chills, fever and unexpected weight change.  HENT: Negative for mouth sores, nosebleeds, sore throat and trouble swallowing.   Eyes: Negative for eye problems and icterus.  Respiratory: Positive for baseline dyspnea on exertion.  Positive for baseline cough mildly worsened.  Negative for hemoptysis and wheezing.   Cardiovascular: Negative for chest pain and leg swelling.  Gastrointestinal: Negative for abdominal pain, constipation, diarrhea, nausea and vomiting.  Genitourinary: Negative for bladder incontinence, difficulty urinating, dysuria, frequency and hematuria.   Musculoskeletal: Negative for back pain, gait problem, neck pain and neck stiffness.  Skin: Positive for dry scattered skin lesions/plaques.  Neurological: Negative for dizziness, extremity weakness, gait problem, headaches, light-headedness and seizures.  Hematological: Negative for adenopathy. Does not bruise/bleed easily.  Psychiatric/Behavioral: Negative for confusion, depression and sleep disturbance. The patient is not nervous/anxious   PHYSICAL EXAMINATION:  Blood pressure 115/74, pulse 84, temperature 97.6 F (36.4 C),  temperature source Oral, resp. rate 16, weight 187 lb 6.4 oz (85 kg), SpO2 99 %.  ECOG PERFORMANCE STATUS: 1  Physical Exam  Constitutional: Oriented to person, place, and time and well-developed, well-nourished, and in no distress.  HENT:  Head: Normocephalic and atraumatic.  Mouth/Throat: Oropharynx is clear and moist. No oropharyngeal exudate.  Eyes: Conjunctivae are normal. Right eye exhibits no discharge. Left eye exhibits no discharge. No scleral icterus.  Neck: Normal range of motion. Neck supple.  Cardiovascular: Normal rate, regular rhythm, normal heart sounds and intact distal pulses.   Pulmonary/Chest: Effort normal and breath sounds normal. No respiratory distress. No wheezes. No rales.  Abdominal: Soft. Bowel sounds are normal. Exhibits no distension and no mass. There is no tenderness.  Musculoskeletal: Normal range of motion. Exhibits no edema.  Lymphadenopathy:    No cervical adenopathy.  Neurological: Alert and oriented to person, place, and time. Exhibits normal muscle tone. Gait normal. Coordination normal.  Skin: Positive for nodular raised skin lesions on his arms, back, chest, lower extremities.  Positive for bruising on upper extremities.  Skin is warm and dry. Not diaphoretic. No erythema. No pallor.  Psychiatric: Mood, memory and judgment normal.  Vitals reviewed.    LABORATORY DATA: Lab Results  Component Value Date   WBC 6.9 05/11/2022   HGB 12.8 (L) 05/11/2022   HCT 37.9 (L) 05/11/2022   MCV 90.5 05/11/2022   PLT 241 05/11/2022      Chemistry      Component Value Date/Time   NA 136 05/11/2022 1416   NA 141 03/08/2017 1707   K 3.6 05/11/2022 1416   CL 105 05/11/2022 1416   CO2 25 05/11/2022 1416   BUN 8 05/11/2022 1416   BUN 12 03/08/2017 1707   CREATININE 0.97 05/11/2022 1416      Component Value Date/Time   CALCIUM 8.6 (L) 05/11/2022 1416   ALKPHOS 57 05/11/2022 1416   AST 24 05/11/2022 1416   ALT 21 05/11/2022 1416   BILITOT 0.3  05/11/2022 1416       RADIOGRAPHIC STUDIES:  CT Chest W Contrast  Result Date: 05/04/2022 CLINICAL DATA:  Lung cancer.  Chemotherapy. * Tracking Code: BO * EXAM: CT CHEST, ABDOMEN, AND PELVIS WITH CONTRAST TECHNIQUE: Multidetector CT imaging of the chest, abdomen and pelvis was performed following the standard protocol during bolus administration of intravenous contrast. RADIATION DOSE REDUCTION: This exam was performed according to the departmental dose-optimization program which includes automated exposure control, adjustment of the mA and/or kV according to patient size and/or use of iterative reconstruction technique. CONTRAST:  OMNIPAQUE IOHEXOL 300 MG/ML  SOLN COMPARISON:  None Available. 12/20/2021 and older FINDINGS: Cardiovascular: Small to moderate pericardial effusion. The heart is nonenlarged. Coronary artery calcifications are seen. Please  correlate for other coronary risk factors. Right IJ chest port in place. Mediastinum/Nodes: Gynecomastia. There is no specific abnormal lymph node enlargement present in the axillary region, left hilum or mediastinum. Grossly normal course and caliber of the thoracic esophagus. Small thyroid gland. Lungs/Pleura: Once again there is a right hilar mass with associated surrounding low-density soft tissue thickening, occlusion and narrowing of the adjacent right lower lobe bronchi. Previously mass was measured at 4.3 x 2.3 cm and today when measured in a similar fashion for continuity on series 2, image 48 lesion measures 4.7 x 2.8 cm, measures slightly larger. The adjacent parenchymal opacity in the right lower lobe and middle lobe is similar. No significant pleural effusion. No pneumothorax. There is some centrilobular emphysematous changes identified. No new dominant lung nodule. Musculoskeletal: Mild degenerative changes along the spine. Presumed hemangioma at the T3 level. Scattered Schmorl's node changes. CT ABDOMEN PELVIS FINDINGS Hepatobiliary: No  enhancing liver lesion. Patent portal vein. Gallbladder is present. No biliary ductal dilatation. Pancreas: Mild pancreatic atrophy.  No enhancing pancreatic mass. Spleen: Spleen is nonenlarged.  Preserved enhancement. Adrenals/Urinary Tract: Adrenal glands are preserved. Minimal thickening of the central aspect of the right adrenal gland unchanged on older examinations going back to May 2013. No enhancing renal mass. No collecting system dilatation. There are some tiny low-density foci in each kidney, under 5 mm and too small to completely characterize the likely small benign cysts. Ureters have normal course and caliber. Preserved contours of the urinary bladder. Stomach/Bowel: Large bowel is of normal course and caliber with diffuse colonic stool. Normal caliber appendix. Stomach is collapsed. The small bowel on this non oral contrast exam is nondilated. Vascular/Lymphatic: Scattered atherosclerotic calcified plaque. There is an occluded left common iliac, proximal external iliac and internal iliac arteries. There is reconstitution along the extreme distal left external iliac artery. Please correlate clinical findings. Extensive atherosclerotic disease as well along the right iliac system. Preserved IVC. There is a retroaortic left renal vein. No specific abnormal lymph node enlargement present in the abdomen and pelvis. Reproductive: Prostate is unremarkable. Other: Small fat containing umbilical hernia.  No ascites. Musculoskeletal: Mild degenerative changes along the spine and pelvis. IMPRESSION: Right hilar lung mass is measured at minimally increased but overall very similar to previous examination. No new mass lesion, fluid collection or lymph node enlargement. Small pericardial effusion. Chronic occlusion of the left arterial system with some reconstitution distally. Please correlate with any specific symptoms. Electronically Signed   By: Karen Kays M.D.   On: 05/04/2022 11:21   CT Abdomen Pelvis W  Contrast  Result Date: 05/04/2022 CLINICAL DATA:  Lung cancer.  Chemotherapy. * Tracking Code: BO * EXAM: CT CHEST, ABDOMEN, AND PELVIS WITH CONTRAST TECHNIQUE: Multidetector CT imaging of the chest, abdomen and pelvis was performed following the standard protocol during bolus administration of intravenous contrast. RADIATION DOSE REDUCTION: This exam was performed according to the departmental dose-optimization program which includes automated exposure control, adjustment of the mA and/or kV according to patient size and/or use of iterative reconstruction technique. CONTRAST:  OMNIPAQUE IOHEXOL 300 MG/ML  SOLN COMPARISON:  None Available. 12/20/2021 and older FINDINGS: Cardiovascular: Small to moderate pericardial effusion. The heart is nonenlarged. Coronary artery calcifications are seen. Please correlate for other coronary risk factors. Right IJ chest port in place. Mediastinum/Nodes: Gynecomastia. There is no specific abnormal lymph node enlargement present in the axillary region, left hilum or mediastinum. Grossly normal course and caliber of the thoracic esophagus. Small thyroid gland. Lungs/Pleura: Once  again there is a right hilar mass with associated surrounding low-density soft tissue thickening, occlusion and narrowing of the adjacent right lower lobe bronchi. Previously mass was measured at 4.3 x 2.3 cm and today when measured in a similar fashion for continuity on series 2, image 48 lesion measures 4.7 x 2.8 cm, measures slightly larger. The adjacent parenchymal opacity in the right lower lobe and middle lobe is similar. No significant pleural effusion. No pneumothorax. There is some centrilobular emphysematous changes identified. No new dominant lung nodule. Musculoskeletal: Mild degenerative changes along the spine. Presumed hemangioma at the T3 level. Scattered Schmorl's node changes. CT ABDOMEN PELVIS FINDINGS Hepatobiliary: No enhancing liver lesion. Patent portal vein. Gallbladder is  present. No biliary ductal dilatation. Pancreas: Mild pancreatic atrophy.  No enhancing pancreatic mass. Spleen: Spleen is nonenlarged.  Preserved enhancement. Adrenals/Urinary Tract: Adrenal glands are preserved. Minimal thickening of the central aspect of the right adrenal gland unchanged on older examinations going back to May 2013. No enhancing renal mass. No collecting system dilatation. There are some tiny low-density foci in each kidney, under 5 mm and too small to completely characterize the likely small benign cysts. Ureters have normal course and caliber. Preserved contours of the urinary bladder. Stomach/Bowel: Large bowel is of normal course and caliber with diffuse colonic stool. Normal caliber appendix. Stomach is collapsed. The small bowel on this non oral contrast exam is nondilated. Vascular/Lymphatic: Scattered atherosclerotic calcified plaque. There is an occluded left common iliac, proximal external iliac and internal iliac arteries. There is reconstitution along the extreme distal left external iliac artery. Please correlate clinical findings. Extensive atherosclerotic disease as well along the right iliac system. Preserved IVC. There is a retroaortic left renal vein. No specific abnormal lymph node enlargement present in the abdomen and pelvis. Reproductive: Prostate is unremarkable. Other: Small fat containing umbilical hernia.  No ascites. Musculoskeletal: Mild degenerative changes along the spine and pelvis. IMPRESSION: Right hilar lung mass is measured at minimally increased but overall very similar to previous examination. No new mass lesion, fluid collection or lymph node enlargement. Small pericardial effusion. Chronic occlusion of the left arterial system with some reconstitution distally. Please correlate with any specific symptoms. Electronically Signed   By: Jill Side M.D.   On: 05/04/2022 11:21     ASSESSMENT/PLAN:  This is a very pleasant 62 year old Caucasian male diagnosed  with stage IV non-small cell lung cancer, adenocarcinoma.  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 He was diagnosed in July 2021. His molecular studies by [employer] show he has MSI high which is a good marker for response to immunotherapy which will be important for future treatment.    The patient will complete SRS to the solidary brain metastasis under the care of Dr. Lisbeth Renshaw later today on 11/14/19.    He then underwent a course of weekly concurrent chemoradiation with carboplatin for an AUC of 2 and paclitaxel 45 mg per metered squared.  He tolerated this treatment well except for fatigue and mild odynophagia.    The patient showed evidence of disease progression with an increase in volume of the right infrahilar mass.  Since the patient has MSI high, the patient then underwent treatment with single agent immunotherapy with Keytruda 200 mg IV every 3 weeks.  The plan is to complete a total of 2 years unless the patient has unacceptable toxicity or disease progression.  The patient is status post 35 cycles  and completed his 2 years of treatment on 03/23/22.  He also received 7 cycles of Avastin for vasogenic edema in the brain.  Avastin has been on hold unless needed again in the future.     The patient recently had a restaging CT scan performed.  The patient was seen with Dr. Arbutus Ped today.  Dr. Arbutus Ped personally independently reviewed the scan and discussed results with the patient today.  Scan did not show any evidence of disease progression there was possibly minimally increase in the right hilar lung mass but overall very similar.  Dr. Arbutus Ped recommends the patient continue on observation for restaging CT scan of the chest in 3 months.  I placed order.  We will see him back for a follow-up visit after that time to review the scan results.  The patient has a Port-A-Cath I will arrange for flush appointments  every 6 to 8 weeks.   He will continue using Kenalog cream and Zyrtec as needed for itching.  The patient was advised to call immediately if he has any concerning symptoms in the interval. The patient voices understanding of current disease status and treatment options and is in agreement with the current care plan. All questions were answered. The patient knows to call the clinic with any problems, questions or concerns. We can certainly see the patient much sooner if necessary   Orders Placed This Encounter  Procedures   CT Chest W Contrast    Standing Status:   Future    Standing Expiration Date:   05/11/2023    Order Specific Question:   If indicated for the ordered procedure, I authorize the administration of contrast media per Radiology protocol    Answer:   Yes    Order Specific Question:   Does the patient have a contrast media/X-ray dye allergy?    Answer:   No    Order Specific Question:   Preferred imaging location?    Answer:   [facility]   CT Abdomen Pelvis W Contrast    Standing Status:   Future    Standing Expiration Date:   05/11/2023    Order Specific Question:   If indicated for the ordered procedure, I authorize the administration of contrast media per Radiology protocol    Answer:   Yes    Order Specific Question:   Does the patient have a contrast media/X-ray dye allergy?    Answer:   No    Order Specific Question:   Preferred imaging location?    Answer:   [facility]    Order Specific Question:   Is Oral Contrast requested for this exam?    Answer:   Yes, Per Radiology protocol     Lasharon Dunivan L Alithea Lapage, PA-C 05/11/22  ADDENDUM: Hematology/Oncology Attending: I had a face-to-face encounter with the patient today.  I reviewed his record, lab, scan and recommended his care plan.  This is a very pleasant 62 years old white male with Stage IV carcinoma, non-small cell lung cancer, adenocarcinoma.  The patient presented with a right lower  lobe/infrahilar mass as well as a solitary brain metastasis in the left cerebellum. He was diagnosed in July 2021. The patient has high MSI.  He also has history of brain metastasis treated with radiotherapy He started treatment on immunotherapy with single agent Keytruda 200 Mg IV every 3 weeks status post 35 cycles.  He has been tolerating his treatment fairly well with no concerning adverse effects. He completed 2 years of  treatment recently.  The patient had repeat CT scan of the chest, abdomen and pelvis performed in the last few days.  I personally and independently reviewed the scan and discussed the result with the patient and his wife. His scan showed no concerning findings for disease progression. I recommended for the patient to continue on observation with close monitoring and repeat CT scan of the chest, abdomen and pelvis in 3 months. For the history of brain metastasis he is followed by Dr. Barbaraann Cao. The patient was advised to call immediately if she has any other concerning symptoms in the interval. The total time spent in the appointment was 30 minutes. Disclaimer: This note was dictated with voice recognition software. Similar sounding words can inadvertently be transcribed and may be missed upon review. Lajuana Matte, MD

## 2022-05-10 ENCOUNTER — Other Ambulatory Visit: Payer: Self-pay | Admitting: Medical Oncology

## 2022-05-10 DIAGNOSIS — C3491 Malignant neoplasm of unspecified part of right bronchus or lung: Secondary | ICD-10-CM

## 2022-05-11 ENCOUNTER — Inpatient Hospital Stay (HOSPITAL_BASED_OUTPATIENT_CLINIC_OR_DEPARTMENT_OTHER): Payer: Managed Care, Other (non HMO) | Admitting: Physician Assistant

## 2022-05-11 ENCOUNTER — Other Ambulatory Visit: Payer: Self-pay

## 2022-05-11 ENCOUNTER — Inpatient Hospital Stay: Payer: Managed Care, Other (non HMO) | Attending: Internal Medicine

## 2022-05-11 VITALS — BP 115/74 | HR 84 | Temp 97.6°F | Resp 16 | Wt 187.4 lb

## 2022-05-11 DIAGNOSIS — Z9221 Personal history of antineoplastic chemotherapy: Secondary | ICD-10-CM | POA: Insufficient documentation

## 2022-05-11 DIAGNOSIS — Z7951 Long term (current) use of inhaled steroids: Secondary | ICD-10-CM | POA: Insufficient documentation

## 2022-05-11 DIAGNOSIS — C3431 Malignant neoplasm of lower lobe, right bronchus or lung: Secondary | ICD-10-CM | POA: Insufficient documentation

## 2022-05-11 DIAGNOSIS — Z7982 Long term (current) use of aspirin: Secondary | ICD-10-CM | POA: Insufficient documentation

## 2022-05-11 DIAGNOSIS — E039 Hypothyroidism, unspecified: Secondary | ICD-10-CM | POA: Diagnosis not present

## 2022-05-11 DIAGNOSIS — C7931 Secondary malignant neoplasm of brain: Secondary | ICD-10-CM | POA: Diagnosis present

## 2022-05-11 DIAGNOSIS — I10 Essential (primary) hypertension: Secondary | ICD-10-CM | POA: Insufficient documentation

## 2022-05-11 DIAGNOSIS — Z95828 Presence of other vascular implants and grafts: Secondary | ICD-10-CM

## 2022-05-11 DIAGNOSIS — Z923 Personal history of irradiation: Secondary | ICD-10-CM | POA: Diagnosis not present

## 2022-05-11 DIAGNOSIS — C3491 Malignant neoplasm of unspecified part of right bronchus or lung: Secondary | ICD-10-CM

## 2022-05-11 DIAGNOSIS — Z79899 Other long term (current) drug therapy: Secondary | ICD-10-CM | POA: Diagnosis not present

## 2022-05-11 LAB — CMP (CANCER CENTER ONLY)
ALT: 21 U/L (ref 0–44)
AST: 24 U/L (ref 15–41)
Albumin: 4.2 g/dL (ref 3.5–5.0)
Alkaline Phosphatase: 57 U/L (ref 38–126)
Anion gap: 6 (ref 5–15)
BUN: 8 mg/dL (ref 6–20)
CO2: 25 mmol/L (ref 22–32)
Calcium: 8.6 mg/dL — ABNORMAL LOW (ref 8.9–10.3)
Chloride: 105 mmol/L (ref 98–111)
Creatinine: 0.97 mg/dL (ref 0.61–1.24)
GFR, Estimated: >60 mL/min (ref >60)
Glucose, Bld: 98 mg/dL (ref 70–99)
Potassium: 3.6 mmol/L (ref 3.5–5.1)
Sodium: 136 mmol/L (ref 135–145)
Total Bilirubin: 0.3 mg/dL (ref 0.3–1.2)
Total Protein: 6.8 g/dL (ref 6.5–8.1)

## 2022-05-11 LAB — CBC WITH DIFFERENTIAL (CANCER CENTER ONLY)
Abs Immature Granulocytes: 0.03 10*3/uL (ref 0.00–0.07)
Basophils Absolute: 0.1 10*3/uL (ref 0.0–0.1)
Basophils Relative: 1 %
Eosinophils Absolute: 0.4 10*3/uL (ref 0.0–0.5)
Eosinophils Relative: 5 %
HCT: 37.9 % — ABNORMAL LOW (ref 39.0–52.0)
Hemoglobin: 12.8 g/dL — ABNORMAL LOW (ref 13.0–17.0)
Immature Granulocytes: 0 %
Lymphocytes Relative: 19 %
Lymphs Abs: 1.3 10*3/uL (ref 0.7–4.0)
MCH: 30.5 pg (ref 26.0–34.0)
MCHC: 33.8 g/dL (ref 30.0–36.0)
MCV: 90.5 fL (ref 80.0–100.0)
Monocytes Absolute: 0.6 10*3/uL (ref 0.1–1.0)
Monocytes Relative: 9 %
Neutro Abs: 4.5 10*3/uL (ref 1.7–7.7)
Neutrophils Relative %: 66 %
Platelet Count: 241 10*3/uL (ref 150–400)
RBC: 4.19 MIL/uL — ABNORMAL LOW (ref 4.22–5.81)
RDW: 13.2 % (ref 11.5–15.5)
WBC Count: 6.9 10*3/uL (ref 4.0–10.5)
nRBC: 0 % (ref 0.0–0.2)

## 2022-05-11 MED ORDER — HEPARIN SOD (PORK) LOCK FLUSH 100 UNIT/ML IV SOLN
500.0000 [IU] | Freq: Once | INTRAVENOUS | Status: AC | PRN
  Administered 2022-05-11: 500 [IU]

## 2022-05-11 MED ORDER — SODIUM CHLORIDE 0.9% FLUSH
10.0000 mL | INTRAVENOUS | Status: DC | PRN
  Administered 2022-05-11: 10 mL

## 2022-05-12 LAB — TSH: TSH: 3.014 u[IU]/mL (ref 0.350–4.500)

## 2022-06-22 ENCOUNTER — Other Ambulatory Visit: Payer: Self-pay

## 2022-06-22 ENCOUNTER — Inpatient Hospital Stay: Payer: Managed Care, Other (non HMO) | Attending: Internal Medicine

## 2022-06-22 DIAGNOSIS — C3431 Malignant neoplasm of lower lobe, right bronchus or lung: Secondary | ICD-10-CM | POA: Diagnosis present

## 2022-06-22 DIAGNOSIS — C7931 Secondary malignant neoplasm of brain: Secondary | ICD-10-CM | POA: Diagnosis present

## 2022-06-22 DIAGNOSIS — Z95828 Presence of other vascular implants and grafts: Secondary | ICD-10-CM

## 2022-06-22 MED ORDER — HEPARIN SOD (PORK) LOCK FLUSH 100 UNIT/ML IV SOLN
500.0000 [IU] | Freq: Once | INTRAVENOUS | Status: AC | PRN
  Administered 2022-06-22: 500 [IU]

## 2022-06-22 MED ORDER — SODIUM CHLORIDE 0.9% FLUSH
10.0000 mL | INTRAVENOUS | Status: DC | PRN
  Administered 2022-06-22: 10 mL

## 2022-06-29 ENCOUNTER — Other Ambulatory Visit: Payer: Self-pay | Admitting: Internal Medicine

## 2022-07-04 ENCOUNTER — Telehealth: Payer: Self-pay | Admitting: *Deleted

## 2022-07-04 NOTE — Telephone Encounter (Signed)
Patient called to report coughing up blood in sputum . Started last week with small amount. He described it as "streaks" of red blood on tissue occasionally. Sputum is clear with blood. He states the amount of blood has increased and is now with every cough. Denies fever. Message/information routed to Dr. Julien Nordmann.

## 2022-07-04 NOTE — Telephone Encounter (Signed)
Contacted patient with Dr. Worthy Flank response: He will need repeat CT scan of the chest and to see me soon in 7-10 days but if the hemoptysis is getting worse, he may need to go to the emergency department to get evaluation sooner.   CT scheduled for 9am on Saturday 07/09/22 at [facility]. NPO 4 hours ahead. Arrive 15 minutes early. Appt with Dr. Julien Nordmann 07/14/22 at 0830.  Contacted patient with appt locations, times and directions. Patient verbalized understanding.

## 2022-07-05 ENCOUNTER — Encounter: Payer: Self-pay | Admitting: *Deleted

## 2022-07-09 ENCOUNTER — Ambulatory Visit (HOSPITAL_BASED_OUTPATIENT_CLINIC_OR_DEPARTMENT_OTHER)
Admission: RE | Admit: 2022-07-09 | Discharge: 2022-07-09 | Disposition: A | Discharge status: Home / Self Care | Payer: Managed Care, Other (non HMO) | Source: Ambulatory Visit | Attending: Physician Assistant | Admitting: Physician Assistant

## 2022-07-09 DIAGNOSIS — C3491 Malignant neoplasm of unspecified part of right bronchus or lung: Secondary | ICD-10-CM | POA: Insufficient documentation

## 2022-07-09 MED ORDER — IOHEXOL 300 MG/ML  SOLN
100.0000 mL | Freq: Once | INTRAMUSCULAR | Status: AC | PRN
  Administered 2022-07-09: 100 mL via INTRAVENOUS

## 2022-07-11 ENCOUNTER — Inpatient Hospital Stay: Payer: Managed Care, Other (non HMO)

## 2022-07-12 ENCOUNTER — Inpatient Hospital Stay: Payer: Managed Care, Other (non HMO) | Admitting: Internal Medicine

## 2022-07-14 ENCOUNTER — Other Ambulatory Visit: Payer: Self-pay

## 2022-07-14 ENCOUNTER — Inpatient Hospital Stay: Payer: Managed Care, Other (non HMO) | Attending: Internal Medicine | Admitting: Internal Medicine

## 2022-07-14 ENCOUNTER — Ambulatory Visit
Admission: RE | Admit: 2022-07-14 | Discharge: 2022-07-14 | Disposition: A | Discharge status: Home / Self Care | Payer: Managed Care, Other (non HMO) | Source: Ambulatory Visit | Attending: Internal Medicine | Admitting: Internal Medicine

## 2022-07-14 VITALS — HR 90 | Temp 97.7°F | Resp 18 | Wt 179.1 lb

## 2022-07-14 DIAGNOSIS — Z87891 Personal history of nicotine dependence: Secondary | ICD-10-CM | POA: Diagnosis not present

## 2022-07-14 DIAGNOSIS — C3490 Malignant neoplasm of unspecified part of unspecified bronchus or lung: Secondary | ICD-10-CM

## 2022-07-14 DIAGNOSIS — C7931 Secondary malignant neoplasm of brain: Secondary | ICD-10-CM | POA: Diagnosis present

## 2022-07-14 DIAGNOSIS — Z801 Family history of malignant neoplasm of trachea, bronchus and lung: Secondary | ICD-10-CM | POA: Diagnosis not present

## 2022-07-14 DIAGNOSIS — C3431 Malignant neoplasm of lower lobe, right bronchus or lung: Secondary | ICD-10-CM | POA: Diagnosis present

## 2022-07-14 MED ORDER — HEPARIN SOD (PORK) LOCK FLUSH 100 UNIT/ML IV SOLN
500.0000 [IU] | Freq: Once | INTRAVENOUS | Status: AC
  Administered 2022-07-14: 500 [IU] via INTRAVENOUS

## 2022-07-14 MED ORDER — SODIUM CHLORIDE 0.9% FLUSH
10.0000 mL | INTRAVENOUS | Status: DC | PRN
  Administered 2022-07-14: 10 mL via INTRAVENOUS

## 2022-07-14 MED ORDER — GADOPICLENOL 0.5 MMOL/ML IV SOLN
7.5000 mL | Freq: Once | INTRAVENOUS | Status: AC | PRN
  Administered 2022-07-14: 7.5 mL via INTRAVENOUS

## 2022-07-14 NOTE — Progress Notes (Signed)
[place] [phone]   [phone]  OFFICE PROGRESS NOTE  [name], NP [address] [name] [address] [place] [address]  DIAGNOSIS: Stage IV (T3, N0, M1C) non-small cell lung cancer, adenocarcinoma.  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 He was diagnosed in July 2021.   Molecular Biomarkers:  MSI-High DETECTED Pembrolizumab Atezolizumab, Avelumab, Cemiplimab, Dostarlimab, Durvalumab, Ipilimumab, Nivolumab   STK11Splice Site SNV [id number] Everolimus, Temsirolimus Yes   KRASG12D 1.7% Binimetinib Yes   [id number] 0.4%   Niraparib, Olaparib, Rucaparib, Talazoparib, Tazemetostat Yes   PRIOR THERAPY:  1) SRS to the solitary brain metastasis under the care of Dr. Lisbeth Renshaw. Last treatment 11/14/19. 2) Weekly concurrent chemoradiation with carboplatin for an AUC of 2, paclitaxel 45 mg/m2.  First dose expected on 11/25/2019. Status post 7 cycles, last dose was given 01/06/2020 with partial response.  3)  Immunotherapy with Keytruda 200 mg IV every 3 weeks.  First dose February 10, 2020 for a patient with MSI high.  Status post 35  cycles. 4) Avastin 15 mg/KG every 3 weeks.  First dose today for the vasogenic edema of the brain.S/P 7 cycles.   CURRENT THERAPY: Observation   INTERVAL HISTORY: Joseph Atkins 62 y.o. male returns to the clinic today for follow-up visit accompanied by his wife.  The patient mentions that she had few episodes of coughing blood recently.  He did not know if it was from his dental issues or irritation in the esophagus or coming from his lung.  He has been on baby aspirin 81 mg p.o. daily for long time.  He is feeling much better with less hemoptysis.  He has no nausea, vomiting, diarrhea or constipation.  He has no headache or visual changes.  He denied having any recent weight loss or night sweats.  He had repeat CT scan of the  chest, abdomen and pelvis performed recently and he is here for evaluation and discussion of his scan results.  MEDICAL HISTORY: Past Medical History:  Diagnosis Date   Cancer ([place])    lung cancer   Diverticulosis    GERD (gastroesophageal reflux disease)    Hx of small bowel obstruction    Hyperlipidemia    Hypertension    Hypothyroidism    IBS (irritable bowel syndrome)    Substance abuse ([place])    Alcoholic, Drug addition   Thyroid disease     ALLERGIES:  is allergic to penicillins.  MEDICATIONS:  Current Outpatient Medications  Medication Sig Dispense Refill   albuterol (PROVENTIL HFA;VENTOLIN HFA) 108 (90 Base) MCG/ACT inhaler Inhale 2 puffs into the lungs every 6 (six) hours as needed for wheezing or shortness of breath. 1 Inhaler 0   amLODipine (NORVASC) 5 MG tablet TAKE 1 TABLET (5 MG TOTAL) BY MOUTH DAILY. 90 tablet 1   ANORO ELLIPTA 62.5-25 MCG/INH AEPB 1 puff daily.     augmented betamethasone dipropionate (DIPROLENE-AF) 0.05 % cream Apply topically 2 (two) times daily.     [facility] ASPIRIN LOW DOSE 81 MG tablet TAKE 1 TABLET (81 MG TOTAL) BY MOUTH DAILY. SWALLOW WHOLE. 90 tablet 3   cyclobenzaprine (FLEXERIL) 10 MG tablet Take 10 mg by mouth 2 (two) times daily as needed for muscle spasms.     docusate sodium (COLACE) 100 MG capsule Take 100 mg by mouth daily.     DULoxetine (CYMBALTA) 20 MG capsule Take 1 capsule (  20 mg total) by mouth 2 (two) times daily. 60 capsule 5   fluticasone furoate-vilanterol (BREO ELLIPTA) 100-25 MCG/INH AEPB Inhale 1 puff into the lungs daily. 30 each 3   hydrOXYzine (ATARAX) 25 MG tablet Take 25 mg by mouth at bedtime.     levothyroxine (SYNTHROID) 100 MCG tablet TAKE 1 TABLET BY MOUTH EVERY DAY 90 tablet 1   lidocaine-prilocaine (EMLA) cream Apply 1 application topically as needed. 30 g 0   omeprazole (PRILOSEC) 40 MG capsule Take 1 capsule (40 mg total) by mouth daily. 90 capsule 4   ondansetron (ZOFRAN) 4 MG tablet Take 1 tablet (4 mg total)  by mouth every 8 (eight) hours as needed. 40 tablet 2   oxyCODONE-acetaminophen (PERCOCET/ROXICET) 5-325 MG tablet Take 1 tablet by mouth every 4 (four) hours as needed for severe pain. 30 tablet 0   pembrolizumab (KEYTRUDA) 100 MG/4ML SOLN See admin instructions.     polyethylene glycol powder (MIRALAX) powder Take 17 g by mouth daily. 255 g 11   prochlorperazine (COMPAZINE) 10 MG tablet Take 1 tablet (10 mg total) by mouth every 6 (six) hours as needed. 30 tablet 2   triamcinolone cream (KENALOG) 0.1 % APPLY TOPICALLY 2 TIMES DAILY AS NEEDED. 454 g 0   TRULANCE 3 MG TABS Take 1 tablet by mouth daily.     zolpidem (AMBIEN CR) 12.5 MG CR tablet Take 1 tablet (12.5 mg total) by mouth at bedtime as needed for sleep. 30 tablet 5   No current facility-administered medications for this visit.    SURGICAL HISTORY:  Past Surgical History:  Procedure Laterality Date   arm surgery Right    BRONCHIAL BRUSHINGS  10/24/2019   Procedure: BRONCHIAL BRUSHINGS;  Surgeon: Collene Gobble, MD;  Location: [facility] ENDOSCOPY;  Service: Cardiopulmonary;;  right lower lobe    BRONCHIAL BRUSHINGS  11/05/2019   Procedure: BRONCHIAL BRUSHINGS;  Surgeon: Collene Gobble, MD;  Location: [facility] ENDOSCOPY;  Service: Pulmonary;;   BRONCHIAL NEEDLE ASPIRATION BIOPSY  10/24/2019   Procedure: BRONCHIAL NEEDLE ASPIRATION BIOPSIES;  Surgeon: Collene Gobble, MD;  Location: [place];  Service: Cardiopulmonary;;   BRONCHIAL NEEDLE ASPIRATION BIOPSY  11/05/2019   Procedure: BRONCHIAL NEEDLE ASPIRATION BIOPSIES;  Surgeon: Collene Gobble, MD;  Location: [facility] ENDOSCOPY;  Service: Pulmonary;;   ENDOBRONCHIAL ULTRASOUND N/A 10/24/2019   Procedure: ENDOBRONCHIAL ULTRASOUND;  Surgeon: Collene Gobble, MD;  Location: [place];  Service: Cardiopulmonary;  Laterality: N/A;   FINGER SURGERY Right    Middle   IR IMAGING GUIDED PORT INSERTION  11/19/2019   VIDEO BRONCHOSCOPY N/A 10/24/2019   Procedure: VIDEO BRONCHOSCOPY WITHOUT FLUORO;  Surgeon:  Collene Gobble, MD;  Location: [place];  Service: Cardiopulmonary;  Laterality: N/A;   VIDEO BRONCHOSCOPY WITH ENDOBRONCHIAL NAVIGATION N/A 11/05/2019   Procedure: VIDEO BRONCHOSCOPY WITH ENDOBRONCHIAL NAVIGATION;  Surgeon: Collene Gobble, MD;  Location: [place] ENDOSCOPY;  Service: Pulmonary;  Laterality: N/A;    REVIEW OF SYSTEMS:  A comprehensive review of systems was negative except for: Constitutional: positive for fatigue   PHYSICAL EXAMINATION: General appearance: alert, cooperative, fatigued, and no distress Head: Normocephalic, without obvious abnormality, atraumatic Neck: no adenopathy, no JVD, supple, symmetrical, trachea midline, and thyroid not enlarged, symmetric, no tenderness/mass/nodules Lymph nodes: Cervical, supraclavicular, and axillary nodes normal. Resp: clear to auscultation bilaterally Back: symmetric, no curvature. ROM normal. No CVA tenderness. Cardio: regular rate and rhythm, S1, S2 normal, no murmur, click, rub or gallop GI: soft, non-tender; bowel sounds normal; no masses,  no  organomegaly Extremities: extremities normal, atraumatic, no cyanosis or edema  ECOG PERFORMANCE STATUS: 1 - Symptomatic but completely ambulatory  Pulse 90, temperature 97.7 F (36.5 C), temperature source Oral, resp. rate 18, weight 179 lb 1 oz (81.2 kg), SpO2 100 %.  LABORATORY DATA: Lab Results  Component Value Date   WBC 6.9 05/11/2022   HGB 12.8 (L) 05/11/2022   HCT 37.9 (L) 05/11/2022   MCV 90.5 05/11/2022   PLT 241 05/11/2022      Chemistry      Component Value Date/Time   NA 136 05/11/2022 1416   NA 141 03/08/2017 1707   K 3.6 05/11/2022 1416   CL 105 05/11/2022 1416   CO2 25 05/11/2022 1416   BUN 8 05/11/2022 1416   BUN 12 03/08/2017 1707   CREATININE 0.97 05/11/2022 1416      Component Value Date/Time   CALCIUM 8.6 (L) 05/11/2022 1416   ALKPHOS 57 05/11/2022 1416   AST 24 05/11/2022 1416   ALT 21 05/11/2022 1416   BILITOT 0.3 05/11/2022 1416        RADIOGRAPHIC STUDIES: CT CHEST ABDOMEN PELVIS W CONTRAST  Result Date: 07/10/2022 CLINICAL DATA:  62 year old male with history of metastatic lung cancer. Follow-up study. * Tracking Code: BO * EXAM: CT CHEST, ABDOMEN, AND PELVIS WITH CONTRAST TECHNIQUE: Multidetector CT imaging of the chest, abdomen and pelvis was performed following the standard protocol during bolus administration of intravenous contrast. RADIATION DOSE REDUCTION: This exam was performed according to the departmental dose-optimization program which includes automated exposure control, adjustment of the mA and/or kV according to patient size and/or use of iterative reconstruction technique. CONTRAST:  183mL OMNIPAQUE IOHEXOL 300 MG/ML  SOLN COMPARISON:  CT of the chest, abdomen and pelvis 05/04/2022. FINDINGS: CT CHEST FINDINGS Cardiovascular: Heart size is normal. Small amount of anterior pericardial fluid and/or thickening, similar to the prior study, unlikely to be of any hemodynamic significance at this time. No pericardial calcification. There is aortic atherosclerosis, as well as atherosclerosis of the great vessels of the mediastinum and the coronary arteries, including calcified atherosclerotic plaque in the left main, left anterior descending and right coronary arteries. Right internal jugular single-lumen Port-A-Cath with tip terminating in the right atrium. Mediastinum/Nodes: No pathologically enlarged mediastinal or hilar lymph nodes. Esophagus is unremarkable in appearance. No axillary lymphadenopathy. Lungs/Pleura: Chronic mass-like area of architectural distortion in the medial aspect of the right lower lobe with surrounding septal thickening, patchy ground-glass attenuation and regional architectural distortion and chronic volume loss, similar to numerous prior examinations, compatible with an area of chronic postradiation mass-like fibrosis. 4 mm right lower lobe pulmonary nodule (axial image 98 of series 3), similar to  numerous prior examinations, considered benign. No other new suspicious appearing pulmonary nodules or masses are noted. No acute consolidative airspace disease. No pleural effusions. Mild diffuse bronchial wall thickening with mild to moderate centrilobular and paraseptal emphysema. Musculoskeletal: There are no aggressive appearing lytic or blastic lesions noted in the visualized portions of the skeleton. CT ABDOMEN PELVIS FINDINGS Hepatobiliary: No suspicious cystic or solid hepatic lesions. No intra or extrahepatic biliary ductal dilatation. Gallbladder is normal in appearance. Pancreas: No pancreatic mass. No pancreatic ductal dilatation. No pancreatic or peripancreatic fluid collections or inflammatory changes. Spleen: Unremarkable. Adrenals/Urinary Tract: Bilateral kidneys and bilateral adrenal glands are normal in appearance. No hydroureteronephrosis. Urinary bladder is normal in appearance. Stomach/Bowel: Normal appearance of the stomach. No pathologic dilatation of small bowel or colon. Normal appendix. Vascular/Lymphatic: Aortic atherosclerosis. Chronic occlusion of the  left common iliac, external iliac and internal iliac arteries with distal reconstitution of flow in the region of the left common femoral artery secondary to collateral pathways. Retroaortic left renal vein (normal anatomical variant) incidentally noted. No lymphadenopathy noted in the abdomen or pelvis. Reproductive: Prostate gland and seminal vesicles are unremarkable in appearance. Other: No significant volume of ascites.  No pneumoperitoneum. Musculoskeletal: There are no aggressive appearing lytic or blastic lesions noted in the visualized portions of the skeleton. IMPRESSION: 1. Stable findings of chronic postradiation mass-like fibrosis in the medial aspect of the right lower lobe, with no findings to suggest recurrent or metastatic disease in the chest, abdomen or pelvis on today's examination. 2. Aortic atherosclerosis, in  addition to left main and 2 vessel coronary artery disease. Chronic occlusive disease in the left pelvic vasculature with distal reconstitution of flow from collateral pathways, similar to prior examinations, as detailed above. 3. Diffuse bronchial wall thickening with mild to moderate centrilobular and paraseptal emphysema; imaging findings suggestive of underlying COPD. 4. Additional incidental findings, as above. Electronically Signed   By: Vinnie Langton M.D.   On: 07/10/2022 11:33     ASSESSMENT AND PLAN: This is a very pleasant 62 years old white male with stage IV (T3, N0, M1c) non-small cell lung cancer, adenocarcinoma with MSI high presented with right lower lobe/infrahilar mass in addition to solitary brain metastasis in the left cerebellum diagnosed in July 2021. He is status post SRS to the solitary brain metastasis. The patient completed a course of concurrent chemoradiation with weekly carboplatin and paclitaxel.  He tolerated the treatment well except for fatigue and mild odynophagia. The patient has MSI high and I recommended for him treatment with immunotherapy with single agent Keytruda 200 mg IV every 3 weeks for a total of 2 years unless the patient has unacceptable toxicity or disease progression. He is status post 35  cycles of treatment with Keytruda.  He also received 7 cycles of Avastin for the vasogenic edema in the brain.   Avastin will be on hold for now unless needed in the future. The patient tolerated his previous treatment with immunotherapy fairly well. He had repeat CT scan of the chest, abdomen and pelvis performed recently.  I personally and independently reviewed the scan and discussed the result with the patient and his wife. His scan showed no concerning findings for disease recurrence or metastasis in the chest, abdomen and pelvis.  There was no explanation for his recent hemoptysis. I recommended for the patient to continue on observation with repeat CT scan of  the chest, abdomen and pelvis in 3 months. I recommended for the patient to check with his dentist for treatment of any gum disease and if he has clear hemoptysis to reach out to pulmonary medicine or go to the emergency department for further evaluation. He was advised to call immediately if he has any other concerning symptoms in the interval. The patient voices understanding of current disease status and treatment options and is in agreement with the current care plan.  All questions were answered. The patient knows to call the clinic with any problems, questions or concerns. We can certainly see the patient much sooner if necessary.  Disclaimer: This note was dictated with voice recognition software. Similar sounding words can inadvertently be transcribed and may not be corrected upon review.

## 2022-07-18 ENCOUNTER — Inpatient Hospital Stay: Payer: Managed Care, Other (non HMO)

## 2022-07-19 ENCOUNTER — Other Ambulatory Visit: Payer: Self-pay

## 2022-07-19 ENCOUNTER — Inpatient Hospital Stay (HOSPITAL_BASED_OUTPATIENT_CLINIC_OR_DEPARTMENT_OTHER): Payer: Managed Care, Other (non HMO) | Admitting: Internal Medicine

## 2022-07-19 VITALS — BP 143/89 | HR 103 | Temp 97.4°F | Resp 14 | Wt 185.2 lb

## 2022-07-19 DIAGNOSIS — C3431 Malignant neoplasm of lower lobe, right bronchus or lung: Secondary | ICD-10-CM | POA: Diagnosis not present

## 2022-07-19 DIAGNOSIS — C7931 Secondary malignant neoplasm of brain: Secondary | ICD-10-CM | POA: Diagnosis not present

## 2022-07-19 NOTE — Progress Notes (Signed)
[facility] at [place], [place] [address] [phone]   Interval Evaluation  Date of Service: 07/19/22 Patient Name: LINDELL PEDDY Patient MRN: [id number] Patient DOB: March 27, 1961 Provider: Ventura Sellers, MD  Identifying Statement:  TERRIEL EILTS is a 62 y.o. male with Malignant neoplasm metastatic to brain [facility]) [C79.31]   Primary Cancer:  Oncologic History: Oncology History  Adenocarcinoma of right lung, stage 4 ([place])  11/14/2019 Initial Diagnosis   Adenocarcinoma of right lung, stage 4 ([place])   11/25/2019 - 01/06/2020 Chemotherapy   The patient had palonosetron (ALOXI) injection 0.25 mg, 0.25 mg, Intravenous,  Once, 7 of 7 cycles Administration: 0.25 mg (11/25/2019), 0.25 mg (12/23/2019), 0.25 mg (12/31/2019), 0.25 mg (12/02/2019), 0.25 mg (01/06/2020), 0.25 mg (12/09/2019), 0.25 mg (12/16/2019) CARBOplatin (PARAPLATIN) 250 mg in sodium chloride 0.9 % 250 mL chemo infusion, 249.8 mg (100 % of original dose 249.8 mg), Intravenous,  Once, 7 of 7 cycles Dose modification: 249.8 mg (original dose 249.8 mg, Cycle 1) Administration: 250 mg (11/25/2019), 250 mg (12/23/2019), 250 mg (12/31/2019), 250 mg (12/02/2019), 250 mg (01/06/2020), 270 mg (12/09/2019), 250 mg (12/16/2019) PACLitaxel (TAXOL) 90 mg in sodium chloride 0.9 % 250 mL chemo infusion (</= 80mg /m2), 45 mg/m2 = 90 mg, Intravenous,  Once, 7 of 7 cycles Administration: 90 mg (11/25/2019), 90 mg (12/23/2019), 90 mg (12/31/2019), 90 mg (12/02/2019), 90 mg (01/06/2020), 90 mg (12/09/2019), 90 mg (12/16/2019)  for chemotherapy treatment.    02/18/2020 - 03/23/2022 Chemotherapy   Patient is on Treatment Plan : LUNG NSCLC Pembrolizumab (200) q21d     03/31/2020 - 07/21/2020 Chemotherapy    Patient is on Treatment Plan: LUNG NSCLC FLAT DOSE PEMBROLIZUMAB Q21D      05/19/2020 Cancer Staging   Staging form: Lung, AJCC 8th Edition - Clinical: Stage IVB (cT3, cN0, cM1c) - Signed by Curt Bears, MD on  05/19/2020    CNS Oncologic History 11/14/19: Completes single frx SRS to 2.4cm cerebellar metastasis 03/05/20: SRS to 7 targets Lisbeth Renshaw) 06/19/20: SRS to 6 additional targets Lisbeth Renshaw)    Interval History:  CHRISTAIN MOURE presents today following recent MRI brain.  Denies any neurologic changes today.  He does complain of ongoing fatigue and low energy, not worse from prior. No recurrence of headaches. Continues to follow with Dr. Julien Nordmann.  H+P (01/24/20) Patient presents today to discuss recent recurrence of neurologic symptoms following radiosurgery in July 2021.  He describes poor balance, wide based walking, needing to hold on to family or objects to walk safely.  He also describes nausea and dizzy-headed feeling at times.  Overall he is functioning in an impaired and sluggish way.  Symptoms have become noticeable since decreasing decadron to less than 4mg  daily; currently he is dosing 2mg  daily.  He felt "quite good" immediately after radiation and during higher dose steroid therapy.  No complications from decadron that he can report.  Recently completed chemoradioatherapy induction for lung adenocarcinoma with Dr. Julien Nordmann.  Medications: Current Outpatient Medications on File Prior to Visit  Medication Sig Dispense Refill   albuterol (PROVENTIL HFA;VENTOLIN HFA) 108 (90 Base) MCG/ACT inhaler Inhale 2 puffs into the lungs every 6 (six) hours as needed for wheezing or shortness of breath. 1 Inhaler 0   amLODipine (NORVASC) 5 MG tablet TAKE 1 TABLET (5 MG TOTAL) BY MOUTH DAILY. 90 tablet 1   ANORO ELLIPTA 62.5-25 MCG/INH AEPB 1 puff daily.     augmented betamethasone dipropionate (DIPROLENE-AF) 0.05 % cream Apply  topically 2 (two) times daily.     [facility] ASPIRIN LOW DOSE 81 MG tablet TAKE 1 TABLET (81 MG TOTAL) BY MOUTH DAILY. SWALLOW WHOLE. 90 tablet 3   cyclobenzaprine (FLEXERIL) 10 MG tablet Take 10 mg by mouth 2 (two) times daily as needed for muscle spasms.     docusate sodium (COLACE) 100  MG capsule Take 100 mg by mouth daily.     DULoxetine (CYMBALTA) 20 MG capsule Take 1 capsule (20 mg total) by mouth 2 (two) times daily. 60 capsule 5   fluticasone furoate-vilanterol (BREO ELLIPTA) 100-25 MCG/INH AEPB Inhale 1 puff into the lungs daily. 30 each 3   hydrOXYzine (ATARAX) 25 MG tablet Take 25 mg by mouth at bedtime.     levothyroxine (SYNTHROID) 100 MCG tablet TAKE 1 TABLET BY MOUTH EVERY DAY 90 tablet 1   lidocaine-prilocaine (EMLA) cream Apply 1 application topically as needed. 30 g 0   omeprazole (PRILOSEC) 40 MG capsule Take 1 capsule (40 mg total) by mouth daily. 90 capsule 4   ondansetron (ZOFRAN) 4 MG tablet Take 1 tablet (4 mg total) by mouth every 8 (eight) hours as needed. 40 tablet 2   oxyCODONE-acetaminophen (PERCOCET/ROXICET) 5-325 MG tablet Take 1 tablet by mouth every 4 (four) hours as needed for severe pain. 30 tablet 0   pembrolizumab (KEYTRUDA) 100 MG/4ML SOLN See admin instructions.     polyethylene glycol powder (MIRALAX) powder Take 17 g by mouth daily. 255 g 11   prochlorperazine (COMPAZINE) 10 MG tablet Take 1 tablet (10 mg total) by mouth every 6 (six) hours as needed. 30 tablet 2   triamcinolone cream (KENALOG) 0.1 % APPLY TOPICALLY 2 TIMES DAILY AS NEEDED. 454 g 0   TRULANCE 3 MG TABS Take 1 tablet by mouth daily.     zolpidem (AMBIEN CR) 12.5 MG CR tablet Take 1 tablet (12.5 mg total) by mouth at bedtime as needed for sleep. 30 tablet 5   No current facility-administered medications on file prior to visit.    Allergies:  Allergies  Allergen Reactions   Penicillins Other (See Comments)    Childhood allergy.  Has patient had a PCN reaction causing immediate rash, facial/tongue/throat swelling, SOB or lightheadedness with hypotension: unknown Has patient had a PCN reaction causing severe rash involving mucus membranes or skin necrosis: unknown Has patient had a PCN reaction that required hospitalization: unknown Has patient had a PCN reaction  occurring within the last 10 years: no If all of the above answers are "NO", then may proceed with Cephalosporin use.    Past Medical History:  Past Medical History:  Diagnosis Date   Cancer ([place])    lung cancer   Diverticulosis    GERD (gastroesophageal reflux disease)    Hx of small bowel obstruction    Hyperlipidemia    Hypertension    Hypothyroidism    IBS (irritable bowel syndrome)    Substance abuse ([facility])    Alcoholic, Drug addition   Thyroid disease    Past Surgical History:  Past Surgical History:  Procedure Laterality Date   arm surgery Right    BRONCHIAL BRUSHINGS  10/24/2019   Procedure: BRONCHIAL BRUSHINGS;  Surgeon: Collene Gobble, MD;  Location: [facility];  Service: Cardiopulmonary;;  right lower lobe    BRONCHIAL BRUSHINGS  11/05/2019   Procedure: BRONCHIAL BRUSHINGS;  Surgeon: Collene Gobble, MD;  Location: [facility];  Service: Pulmonary;;   BRONCHIAL NEEDLE ASPIRATION BIOPSY  10/24/2019   Procedure: BRONCHIAL NEEDLE ASPIRATION BIOPSIES;  Surgeon: Collene Gobble, MD;  Location: [facility] ENDOSCOPY;  Service: Cardiopulmonary;;   BRONCHIAL NEEDLE ASPIRATION BIOPSY  11/05/2019   Procedure: BRONCHIAL NEEDLE ASPIRATION BIOPSIES;  Surgeon: Collene Gobble, MD;  Location[facility] ENDOSCOPY;  Service: Pulmonary;;   ENDOBRONCHIAL ULTRASOUND N/A 10/24/2019   Procedure: ENDOBRONCHIAL ULTRASOUND;  Surgeon: Collene Gobble, MD;  Location: [facility];  Service: Cardiopulmonary;  Laterality: N/A;   FINGER SURGERY Right    Middle   IR IMAGING GUIDED PORT INSERTION  11/19/2019   VIDEO BRONCHOSCOPY N/A 10/24/2019   Procedure: VIDEO BRONCHOSCOPY WITHOUT FLUORO;  Surgeon: Collene Gobble, MD;  Location: [facility];  Service: Cardiopulmonary;  Laterality: N/A;   VIDEO BRONCHOSCOPY WITH ENDOBRONCHIAL NAVIGATION N/A 11/05/2019   Procedure: VIDEO BRONCHOSCOPY WITH ENDOBRONCHIAL NAVIGATION;  Surgeon: Collene Gobble, MD;  Location: [facility] ENDOSCOPY;  Service: Pulmonary;  Laterality: N/A;   Social  History:  Social History   Socioeconomic History   Marital status: Married    Spouse name: Not on file   Number of children: 2   Years of education: Not on file   Highest education level: Not on file  Occupational History   Occupation: supervisor    Employer: [employer]  Tobacco Use   Smoking status: Former    Packs/day: 1.5    Types: Cigarettes   Smokeless tobacco: Former    Types: Nurse, children's Use: Never used  Substance and Sexual Activity   Alcohol use: No    Comment: Alcoholic clean for 6 years   Drug use: No    Comment: Recovering Drug Addict-clean for 6 years   Sexual activity: Not on file  Other Topics Concern   Not on file  Social History Narrative   Not on file   Social Determinants of Health   Financial Resource Strain: [place] (10/31/2019)   Overall Financial Resource Strain (CARDIA)    Difficulty of Paying Living Expenses: Somewhat hard  Food Insecurity: No Food Insecurity (10/31/2019)   Hunger Vital Sign    Worried About Running Out of Food in the Last Year: Never true    Ran Out of Food in the Last Year: Never true  Transportation Needs: No Transportation Needs (10/31/2019)   PRAPARE - Hydrologist (Medical): No    Lack of Transportation (Non-Medical): No  Physical Activity: Insufficiently Active (10/31/2019)   Exercise Vital Sign    Days of Exercise per Week: 5 days    Minutes of Exercise per Session: 20 min  Stress: Stress Concern Present (10/31/2019)   [place]    Feeling of Stress : Very much  Social Connections: Moderately Isolated (10/31/2019)   Social Connection and Isolation Panel [NHANES]    Frequency of Communication with Friends and Family: More than three times a week    Frequency of Social Gatherings with Friends and Family: More than three times a week    Attends Religious Services: Never    Marine scientist or  Organizations: No    Attends Archivist Meetings: Never    Marital Status: Married  Human resources officer Violence: Not At Risk (10/31/2019)   Humiliation, Afraid, Rape, and Kick questionnaire    Fear of Current or Ex-Partner: No    Emotionally Abused: No    Physically Abused: No    Sexually Abused: No   Family History:  Family History  Problem Relation Age of Onset   Epilepsy Mother  Heart disease Mother    Heart disease Brother    Lung cancer Paternal Uncle     Review of Systems: Constitutional: Doesn't report fevers, chills or abnormal weight loss Eyes: Doesn't report blurriness of vision Ears, nose, mouth, throat, and face: Doesn't report sore throat Respiratory: Doesn't report cough, dyspnea or wheezes Cardiovascular: Doesn't report palpitation, chest discomfort  Gastrointestinal:  Doesn't report nausea, constipation, diarrhea GU: Doesn't report incontinence Skin: Doesn't report skin rashes Neurological: Per HPI Musculoskeletal: Doesn't report joint pain Behavioral/Psych: Doesn't report anxiety  Physical Exam: Vitals:   07/19/22 0854  BP: (!) 143/89  Pulse: (!) 103  Resp: 14  Temp: (!) 97.4 F (36.3 C)  SpO2: 100%    KPS: 70. General: Alert, cooperative, pleasant, in no acute distress Head: Normal EENT: No conjunctival injection or scleral icterus.  Lungs: Resp effort normal Cardiac: Regular rate Abdomen: Non-distended abdomen Skin: No rashes cyanosis or petechiae. Extremities: No clubbing or edema  Neurologic Exam: Mental Status: Awake, alert, attentive to examiner. Oriented to self and environment. Language is fluent with intact comprehension.  Cranial Nerves: Visual acuity is grossly normal. Visual fields are full. Extra-ocular movements intact. No ptosis. Face is symmetric Motor: Tone and bulk are normal. Power is full in both arms and legs. Reflexes are symmetric, no pathologic reflexes present.  Dysmetria L>R Sensory: Intact to light  touch Gait: Dystaxic, wide based   Labs: I have reviewed the data as listed    Component Value Date/Time   NA 136 05/11/2022 1416   NA 141 03/08/2017 1707   K 3.6 05/11/2022 1416   CL 105 05/11/2022 1416   CO2 25 05/11/2022 1416   GLUCOSE 98 05/11/2022 1416   BUN 8 05/11/2022 1416   BUN 12 03/08/2017 1707   CREATININE 0.97 05/11/2022 1416   CALCIUM 8.6 (L) 05/11/2022 1416   PROT 6.8 05/11/2022 1416   PROT 7.1 03/08/2017 1707   ALBUMIN 4.2 05/11/2022 1416   ALBUMIN 4.5 03/08/2017 1707   AST 24 05/11/2022 1416   ALT 21 05/11/2022 1416   ALKPHOS 57 05/11/2022 1416   BILITOT 0.3 05/11/2022 1416   GFRNONAA >60 05/11/2022 1416   GFRAA >60 01/06/2020 0837   Lab Results  Component Value Date   WBC 6.9 05/11/2022   NEUTROABS 4.5 05/11/2022   HGB 12.8 (L) 05/11/2022   HCT 37.9 (L) 05/11/2022   MCV 90.5 05/11/2022   PLT 241 05/11/2022    Imaging:  [facility] Clinician Interpretation: I have personally reviewed the CNS images as listed.  My interpretation, in the context of the patient's clinical presentation, is stable disease  MR BRAIN W WO CONTRAST  Result Date: 07/18/2022 CLINICAL DATA:  Brain/CNS neoplasm assess treatment response. Brain metastases from lung cancer x2 years. EXAM: MRI HEAD WITHOUT AND WITH CONTRAST TECHNIQUE: Multiplanar, multiecho pulse sequences of the brain and surrounding structures were obtained without and with intravenous contrast. CONTRAST:  7.5 mL Vueway. COMPARISON:  None Available. FINDINGS: BRAIN New Lesions: None. Larger lesions: None. Stable or Smaller lesions: 1. Slightly decreased size of the enhancing lesion in the left cerebellar hemisphere, now measuring up to 27 x 22 mm (image 45 series 14), previously 28 x 24 mm. 2. Unchanged 5 mm enhancing lesion along medial aspect of the left occipital lobe (image 81 series 14). 3. Unchanged 3 mm enhancing lesion in the right occipital lobe (image 91 series 14). 4. Unchanged adjacent 3 mm foci of enhancement in  the superior aspect of the left occipital lobe (image 97  series 14. 5. Unchanged 4 mm enhancing lesion along the right precentral gyrus (image 138 series 14). 6. Unchanged peripherally enhancing lesion along the medial aspect of the left precentral gyrus measuring up to 7 mm (image 153 series 14). Other Brain findings: No acute infarct or intracranial hemorrhage. No hydrocephalus, extra-axial collection or midline shift. Vascular: Normal flow voids. Skull and upper cervical spine: No suspicious marrow lesions. Sinuses/Orbits: Mucous retention cyst in the right maxillary sinus. Orbits are unremarkable. Mastoids are well aerated. Other: None. IMPRESSION: 1. Slightly decreased size of the enhancing lesion in the left cerebellar hemisphere, now measuring up to 27 mm. 2. Otherwise, stable size and appearance of the other enhancing lesions in the bilateral occipital lobes and bilateral precentral gyri. No new lesions. Electronically Signed   By: Emmit Alexanders M.D.   On: 07/18/2022 11:51   CT CHEST ABDOMEN PELVIS W CONTRAST  Result Date: 07/10/2022 CLINICAL DATA:  62 year old male with history of metastatic lung cancer. Follow-up study. * Tracking Code: BO * EXAM: CT CHEST, ABDOMEN, AND PELVIS WITH CONTRAST TECHNIQUE: Multidetector CT imaging of the chest, abdomen and pelvis was performed following the standard protocol during bolus administration of intravenous contrast. RADIATION DOSE REDUCTION: This exam was performed according to the departmental dose-optimization program which includes automated exposure control, adjustment of the mA and/or kV according to patient size and/or use of iterative reconstruction technique. CONTRAST:  123mL OMNIPAQUE IOHEXOL 300 MG/ML  SOLN COMPARISON:  CT of the chest, abdomen and pelvis 05/04/2022. FINDINGS: CT CHEST FINDINGS Cardiovascular: Heart size is normal. Small amount of anterior pericardial fluid and/or thickening, similar to the prior study, unlikely to be of any  hemodynamic significance at this time. No pericardial calcification. There is aortic atherosclerosis, as well as atherosclerosis of the great vessels of the mediastinum and the coronary arteries, including calcified atherosclerotic plaque in the left main, left anterior descending and right coronary arteries. Right internal jugular single-lumen Port-A-Cath with tip terminating in the right atrium. Mediastinum/Nodes: No pathologically enlarged mediastinal or hilar lymph nodes. Esophagus is unremarkable in appearance. No axillary lymphadenopathy. Lungs/Pleura: Chronic mass-like area of architectural distortion in the medial aspect of the right lower lobe with surrounding septal thickening, patchy ground-glass attenuation and regional architectural distortion and chronic volume loss, similar to numerous prior examinations, compatible with an area of chronic postradiation mass-like fibrosis. 4 mm right lower lobe pulmonary nodule (axial image 98 of series 3), similar to numerous prior examinations, considered benign. No other new suspicious appearing pulmonary nodules or masses are noted. No acute consolidative airspace disease. No pleural effusions. Mild diffuse bronchial wall thickening with mild to moderate centrilobular and paraseptal emphysema. Musculoskeletal: There are no aggressive appearing lytic or blastic lesions noted in the visualized portions of the skeleton. CT ABDOMEN PELVIS FINDINGS Hepatobiliary: No suspicious cystic or solid hepatic lesions. No intra or extrahepatic biliary ductal dilatation. Gallbladder is normal in appearance. Pancreas: No pancreatic mass. No pancreatic ductal dilatation. No pancreatic or peripancreatic fluid collections or inflammatory changes. Spleen: Unremarkable. Adrenals/Urinary Tract: Bilateral kidneys and bilateral adrenal glands are normal in appearance. No hydroureteronephrosis. Urinary bladder is normal in appearance. Stomach/Bowel: Normal appearance of the stomach. No  pathologic dilatation of small bowel or colon. Normal appendix. Vascular/Lymphatic: Aortic atherosclerosis. Chronic occlusion of the left common iliac, external iliac and internal iliac arteries with distal reconstitution of flow in the region of the left common femoral artery secondary to collateral pathways. Retroaortic left renal vein (normal anatomical variant) incidentally noted. No lymphadenopathy noted in the abdomen  or pelvis. Reproductive: Prostate gland and seminal vesicles are unremarkable in appearance. Other: No significant volume of ascites.  No pneumoperitoneum. Musculoskeletal: There are no aggressive appearing lytic or blastic lesions noted in the visualized portions of the skeleton. IMPRESSION: 1. Stable findings of chronic postradiation mass-like fibrosis in the medial aspect of the right lower lobe, with no findings to suggest recurrent or metastatic disease in the chest, abdomen or pelvis on today's examination. 2. Aortic atherosclerosis, in addition to left main and 2 vessel coronary artery disease. Chronic occlusive disease in the left pelvic vasculature with distal reconstitution of flow from collateral pathways, similar to prior examinations, as detailed above. 3. Diffuse bronchial wall thickening with mild to moderate centrilobular and paraseptal emphysema; imaging findings suggestive of underlying COPD. 4. Additional incidental findings, as above. Electronically Signed   By: Vinnie Langton M.D.   On: 07/10/2022 11:33     Assessment/Plan Malignant neoplasm metastatic to brain [facility]) [C79.31]  YACQUB CICOTTE is clinically stable today.  MRI demonstrate some contraction of treated left cerebellar metastasis.  He will continue on Keytruda monotherapy with Dr. Julien Nordmann.  Otherwise we ask that [name] return to clinic in 6 months following next brain MRI, or sooner as needed.  We appreciate the opportunity to participate in the care of NICOLIS HEASTER.   All  questions were answered. The patient knows to call the clinic with any problems, questions or concerns. No barriers to learning were detected.  The total time spent in the encounter was 30 minutes and more than 50% was on counseling and review of test results   Ventura Sellers, MD Medical Director of Neuro-Oncology [facility] at [place] 07/19/22 9:00 AM

## 2022-07-21 ENCOUNTER — Telehealth: Payer: Self-pay | Admitting: Internal Medicine

## 2022-07-21 NOTE — Telephone Encounter (Signed)
Called patient regarding upcoming May and June appointments, patient is notified. ?

## 2022-08-05 ENCOUNTER — Telehealth: Payer: Self-pay

## 2022-08-05 NOTE — Telephone Encounter (Signed)
Notified patient of completion of Disability Forms. Fax transmission confirmation received. Copy of forms placed for pick-up as requested by Patient. No other needs or concerns voiced at this time.

## 2022-08-10 ENCOUNTER — Inpatient Hospital Stay: Payer: Managed Care, Other (non HMO)

## 2022-08-15 ENCOUNTER — Ambulatory Visit: Payer: Managed Care, Other (non HMO) | Admitting: Internal Medicine

## 2022-08-24 ENCOUNTER — Inpatient Hospital Stay: Payer: Managed Care, Other (non HMO) | Attending: Internal Medicine

## 2022-08-24 ENCOUNTER — Other Ambulatory Visit: Payer: Self-pay

## 2022-08-24 DIAGNOSIS — C7931 Secondary malignant neoplasm of brain: Secondary | ICD-10-CM | POA: Diagnosis not present

## 2022-08-24 DIAGNOSIS — Z95828 Presence of other vascular implants and grafts: Secondary | ICD-10-CM

## 2022-08-24 DIAGNOSIS — C3431 Malignant neoplasm of lower lobe, right bronchus or lung: Secondary | ICD-10-CM | POA: Diagnosis present

## 2022-08-24 DIAGNOSIS — Z452 Encounter for adjustment and management of vascular access device: Secondary | ICD-10-CM | POA: Diagnosis not present

## 2022-08-24 MED ORDER — HEPARIN SOD (PORK) LOCK FLUSH 100 UNIT/ML IV SOLN
500.0000 [IU] | Freq: Once | INTRAVENOUS | Status: AC | PRN
  Administered 2022-08-24: 500 [IU]

## 2022-08-24 MED ORDER — SODIUM CHLORIDE 0.9% FLUSH
10.0000 mL | INTRAVENOUS | Status: DC | PRN
  Administered 2022-08-24: 10 mL

## 2022-09-28 ENCOUNTER — Inpatient Hospital Stay: Payer: Managed Care, Other (non HMO) | Attending: Internal Medicine

## 2022-09-28 ENCOUNTER — Other Ambulatory Visit: Payer: Self-pay

## 2022-09-28 DIAGNOSIS — I700 Atherosclerosis of aorta: Secondary | ICD-10-CM | POA: Insufficient documentation

## 2022-09-28 DIAGNOSIS — Z923 Personal history of irradiation: Secondary | ICD-10-CM | POA: Insufficient documentation

## 2022-09-28 DIAGNOSIS — K760 Fatty (change of) liver, not elsewhere classified: Secondary | ICD-10-CM | POA: Insufficient documentation

## 2022-09-28 DIAGNOSIS — J432 Centrilobular emphysema: Secondary | ICD-10-CM | POA: Diagnosis not present

## 2022-09-28 DIAGNOSIS — K589 Irritable bowel syndrome without diarrhea: Secondary | ICD-10-CM | POA: Insufficient documentation

## 2022-09-28 DIAGNOSIS — G936 Cerebral edema: Secondary | ICD-10-CM | POA: Insufficient documentation

## 2022-09-28 DIAGNOSIS — Z452 Encounter for adjustment and management of vascular access device: Secondary | ICD-10-CM | POA: Diagnosis not present

## 2022-09-28 DIAGNOSIS — R1310 Dysphagia, unspecified: Secondary | ICD-10-CM | POA: Insufficient documentation

## 2022-09-28 DIAGNOSIS — C7931 Secondary malignant neoplasm of brain: Secondary | ICD-10-CM | POA: Diagnosis not present

## 2022-09-28 DIAGNOSIS — I2510 Atherosclerotic heart disease of native coronary artery without angina pectoris: Secondary | ICD-10-CM | POA: Diagnosis not present

## 2022-09-28 DIAGNOSIS — Z79899 Other long term (current) drug therapy: Secondary | ICD-10-CM | POA: Diagnosis not present

## 2022-09-28 DIAGNOSIS — E785 Hyperlipidemia, unspecified: Secondary | ICD-10-CM | POA: Diagnosis not present

## 2022-09-28 DIAGNOSIS — Z7951 Long term (current) use of inhaled steroids: Secondary | ICD-10-CM | POA: Diagnosis not present

## 2022-09-28 DIAGNOSIS — I10 Essential (primary) hypertension: Secondary | ICD-10-CM | POA: Diagnosis not present

## 2022-09-28 DIAGNOSIS — Z79890 Hormone replacement therapy (postmenopausal): Secondary | ICD-10-CM | POA: Diagnosis not present

## 2022-09-28 DIAGNOSIS — C3431 Malignant neoplasm of lower lobe, right bronchus or lung: Secondary | ICD-10-CM | POA: Diagnosis present

## 2022-09-28 DIAGNOSIS — Z7982 Long term (current) use of aspirin: Secondary | ICD-10-CM | POA: Diagnosis not present

## 2022-09-28 DIAGNOSIS — E039 Hypothyroidism, unspecified: Secondary | ICD-10-CM | POA: Insufficient documentation

## 2022-09-28 DIAGNOSIS — Z95828 Presence of other vascular implants and grafts: Secondary | ICD-10-CM

## 2022-09-28 DIAGNOSIS — K219 Gastro-esophageal reflux disease without esophagitis: Secondary | ICD-10-CM | POA: Diagnosis not present

## 2022-09-28 MED ORDER — HEPARIN SOD (PORK) LOCK FLUSH 100 UNIT/ML IV SOLN
500.0000 [IU] | Freq: Once | INTRAVENOUS | Status: AC | PRN
  Administered 2022-09-28: 500 [IU]

## 2022-09-28 MED ORDER — SODIUM CHLORIDE 0.9% FLUSH
10.0000 mL | INTRAVENOUS | Status: DC | PRN
  Administered 2022-09-28: 10 mL

## 2022-10-13 ENCOUNTER — Other Ambulatory Visit: Payer: Self-pay

## 2022-10-13 ENCOUNTER — Inpatient Hospital Stay: Payer: Managed Care, Other (non HMO)

## 2022-10-13 ENCOUNTER — Other Ambulatory Visit: Payer: Managed Care, Other (non HMO)

## 2022-10-13 ENCOUNTER — Ambulatory Visit (HOSPITAL_COMMUNITY)
Admission: RE | Admit: 2022-10-13 | Discharge: 2022-10-13 | Disposition: A | Discharge status: Home / Self Care | Payer: Managed Care, Other (non HMO) | Source: Ambulatory Visit | Attending: Internal Medicine | Admitting: Internal Medicine

## 2022-10-13 DIAGNOSIS — C3490 Malignant neoplasm of unspecified part of unspecified bronchus or lung: Secondary | ICD-10-CM | POA: Diagnosis not present

## 2022-10-13 DIAGNOSIS — Z95828 Presence of other vascular implants and grafts: Secondary | ICD-10-CM

## 2022-10-13 LAB — CBC WITH DIFFERENTIAL (CANCER CENTER ONLY)
Abs Immature Granulocytes: 0.06 10*3/uL (ref 0.00–0.07)
Basophils Absolute: 0.1 10*3/uL (ref 0.0–0.1)
Basophils Relative: 1 %
Eosinophils Absolute: 0.2 10*3/uL (ref 0.0–0.5)
Eosinophils Relative: 2 %
HCT: 41.4 % (ref 39.0–52.0)
Hemoglobin: 13.9 g/dL (ref 13.0–17.0)
Immature Granulocytes: 1 %
Lymphocytes Relative: 14 %
Lymphs Abs: 1.4 10*3/uL (ref 0.7–4.0)
MCH: 30.3 pg (ref 26.0–34.0)
MCHC: 33.6 g/dL (ref 30.0–36.0)
MCV: 90.4 fL (ref 80.0–100.0)
Monocytes Absolute: 0.9 10*3/uL (ref 0.1–1.0)
Monocytes Relative: 9 %
Neutro Abs: 7.5 10*3/uL (ref 1.7–7.7)
Neutrophils Relative %: 73 %
Platelet Count: 281 10*3/uL (ref 150–400)
RBC: 4.58 MIL/uL (ref 4.22–5.81)
RDW: 15.3 % (ref 11.5–15.5)
WBC Count: 10.2 10*3/uL (ref 4.0–10.5)
nRBC: 0 % (ref 0.0–0.2)

## 2022-10-13 LAB — CMP (CANCER CENTER ONLY)
ALT: 19 U/L (ref 0–44)
AST: 23 U/L (ref 15–41)
Albumin: 4.1 g/dL (ref 3.5–5.0)
Alkaline Phosphatase: 97 U/L (ref 38–126)
Anion gap: 7 (ref 5–15)
BUN: 9 mg/dL (ref 6–20)
CO2: 26 mmol/L (ref 22–32)
Calcium: 9.3 mg/dL (ref 8.9–10.3)
Chloride: 101 mmol/L (ref 98–111)
Creatinine: 0.98 mg/dL (ref 0.61–1.24)
GFR, Estimated: >60 mL/min (ref >60)
Glucose, Bld: 112 mg/dL — ABNORMAL HIGH (ref 70–99)
Potassium: 4.6 mmol/L (ref 3.5–5.1)
Sodium: 134 mmol/L — ABNORMAL LOW (ref 135–145)
Total Bilirubin: 0.6 mg/dL (ref 0.3–1.2)
Total Protein: 7.1 g/dL (ref 6.5–8.1)

## 2022-10-13 MED ORDER — HEPARIN SOD (PORK) LOCK FLUSH 100 UNIT/ML IV SOLN: 500.0000 [IU] | Freq: Once | INTRAVENOUS | Status: DC | PRN

## 2022-10-13 MED ORDER — IOHEXOL 300 MG/ML  SOLN
100.0000 mL | Freq: Once | INTRAMUSCULAR | Status: AC | PRN
  Administered 2022-10-13: 100 mL via INTRAVENOUS

## 2022-10-13 MED ORDER — HEPARIN SOD (PORK) LOCK FLUSH 100 UNIT/ML IV SOLN
500.0000 [IU] | Freq: Once | INTRAVENOUS | Status: AC
  Administered 2022-10-13: 500 [IU] via INTRAVENOUS

## 2022-10-13 MED ORDER — SODIUM CHLORIDE 0.9% FLUSH
10.0000 mL | INTRAVENOUS | Status: DC | PRN
  Administered 2022-10-13: 10 mL

## 2022-10-13 MED ORDER — IOHEXOL 9 MG/ML PO SOLN
1000.0000 mL | Freq: Once | ORAL | Status: AC
  Administered 2022-10-13: 1000 mL via ORAL

## 2022-10-17 ENCOUNTER — Other Ambulatory Visit: Payer: Self-pay

## 2022-10-17 ENCOUNTER — Inpatient Hospital Stay (HOSPITAL_BASED_OUTPATIENT_CLINIC_OR_DEPARTMENT_OTHER): Payer: Managed Care, Other (non HMO) | Admitting: Internal Medicine

## 2022-10-17 VITALS — BP 135/81 | HR 100 | Temp 97.6°F | Resp 18 | Ht 68.0 in | Wt 187.9 lb

## 2022-10-17 DIAGNOSIS — C3490 Malignant neoplasm of unspecified part of unspecified bronchus or lung: Secondary | ICD-10-CM | POA: Diagnosis not present

## 2022-10-17 DIAGNOSIS — C3431 Malignant neoplasm of lower lobe, right bronchus or lung: Secondary | ICD-10-CM | POA: Diagnosis not present

## 2022-10-17 NOTE — Progress Notes (Signed)
[facility] [phone]   [phone]  OFFICE PROGRESS NOTE  Joseph Chesterfield, NP [name] [place] [address]  DIAGNOSIS: Stage IV (T3, N0, M1C) non-small cell lung cancer, adenocarcinoma.  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 He was diagnosed in July 2021.   Molecular Biomarkers:  MSI-High DETECTED Pembrolizumab Atezolizumab, Avelumab, Cemiplimab, Dostarlimab, Durvalumab, Ipilimumab, Nivolumab   STK11Splice Site SNV 1.9% Everolimus, Temsirolimus Yes   KRASG12D 1.7% Binimetinib Yes   [id number] 0.4%   Niraparib, Olaparib, Rucaparib, Talazoparib, Tazemetostat Yes   PRIOR THERAPY:  1) SRS to the solitary brain metastasis under the care of Joseph Atkins. Last treatment 11/14/19. 2) Weekly concurrent chemoradiation with carboplatin for an AUC of 2, paclitaxel 45 mg/m2.  First dose expected on 11/25/2019. Status post 7 cycles, last dose was given 01/06/2020 with partial response.  3)  Immunotherapy with Keytruda 200 mg IV every 3 weeks.  First dose February 10, 2020 for a patient with MSI high.  Status post 35  cycles. 4) Avastin 15 mg/KG every 3 weeks.  First dose today for the vasogenic edema of the brain.S/P 7 cycles.   CURRENT THERAPY: Observation   INTERVAL HISTORY: Joseph Atkins 62 y.o. male returns to the clinic today for follow-up visit.  The patient is feeling fine today with no concerning complaints except for chest wheezing and shortness of breath with exertion.  He denied having any current chest pain but has mild cough with no hemoptysis.  He has no nausea, vomiting, diarrhea or constipation.  He has no headache or visual changes.  He denied having any recent weight loss or night sweats.  He has been in observation.  He had repeat CT scan of the chest, abdomen and pelvis performed recently and he is here for evaluation  and discussion of his scan results.  MEDICAL HISTORY: Past Medical History:  Diagnosis Date   Cancer (HCC)    lung cancer   Diverticulosis    GERD (gastroesophageal reflux disease)    Hx of small bowel obstruction    Hyperlipidemia    Hypertension    Hypothyroidism    IBS (irritable bowel syndrome)    Substance abuse (HCC)    Alcoholic, Drug addition   Thyroid disease     ALLERGIES:  is allergic to penicillins.  MEDICATIONS:  Current Outpatient Medications  Medication Sig Dispense Refill   albuterol (PROVENTIL HFA;VENTOLIN HFA) 108 (90 Base) MCG/ACT inhaler Inhale 2 puffs into the lungs every 6 (six) hours as needed for wheezing or shortness of breath. 1 Inhaler 0   amLODipine (NORVASC) 5 MG tablet TAKE 1 TABLET (5 MG TOTAL) BY MOUTH DAILY. 90 tablet 1   ANORO ELLIPTA 62.5-25 MCG/INH AEPB 1 puff daily.     augmented betamethasone dipropionate (DIPROLENE-AF) 0.05 % cream Apply topically 2 (two) times daily.     [facility] ASPIRIN LOW DOSE 81 MG tablet TAKE 1 TABLET (81 MG TOTAL) BY MOUTH DAILY. SWALLOW WHOLE. 90 tablet 3   cyclobenzaprine (FLEXERIL) 10 MG tablet Take 10 mg by mouth 2 (two) times daily as needed for muscle spasms.     docusate sodium (COLACE) 100 MG capsule Take 100 mg by mouth daily.     DULoxetine (CYMBALTA) 20 MG capsule Take 1 capsule (20 mg total) by mouth 2 (two) times daily. 60 capsule 5   fluticasone furoate-vilanterol (BREO ELLIPTA) 100-25 MCG/INH  AEPB Inhale 1 puff into the lungs daily. 30 each 3   hydrOXYzine (ATARAX) 25 MG tablet Take 25 mg by mouth at bedtime.     levothyroxine (SYNTHROID) 100 MCG tablet TAKE 1 TABLET BY MOUTH EVERY DAY 90 tablet 1   lidocaine-prilocaine (EMLA) cream Apply 1 application topically as needed. 30 g 0   omeprazole (PRILOSEC) 40 MG capsule Take 1 capsule (40 mg total) by mouth daily. 90 capsule 4   ondansetron (ZOFRAN) 4 MG tablet Take 1 tablet (4 mg total) by mouth every 8 (eight) hours as needed. 40 tablet 2    oxyCODONE-acetaminophen (PERCOCET/ROXICET) 5-325 MG tablet Take 1 tablet by mouth every 4 (four) hours as needed for severe pain. 30 tablet 0   pembrolizumab (KEYTRUDA) 100 MG/4ML SOLN See admin instructions.     polyethylene glycol powder (MIRALAX) powder Take 17 g by mouth daily. 255 g 11   prochlorperazine (COMPAZINE) 10 MG tablet Take 1 tablet (10 mg total) by mouth every 6 (six) hours as needed. 30 tablet 2   triamcinolone cream (KENALOG) 0.1 % APPLY TOPICALLY 2 TIMES DAILY AS NEEDED. 454 g 0   TRULANCE 3 MG TABS Take 1 tablet by mouth daily.     zolpidem (AMBIEN CR) 12.5 MG CR tablet Take 1 tablet (12.5 mg total) by mouth at bedtime as needed for sleep. (Patient not taking: Reported on 07/19/2022) 30 tablet 5   No current facility-administered medications for this visit.    SURGICAL HISTORY:  Past Surgical History:  Procedure Laterality Date   arm surgery Right    BRONCHIAL BRUSHINGS  10/24/2019   Procedure: BRONCHIAL BRUSHINGS;  Surgeon: Leslye Peer, MD;  Location: [facility] ENDOSCOPY;  Service: Cardiopulmonary;;  right lower lobe    BRONCHIAL BRUSHINGS  11/05/2019   Procedure: BRONCHIAL BRUSHINGS;  Surgeon: Leslye Peer, MD;  Location: [facility] ENDOSCOPY;  Service: Pulmonary;;   BRONCHIAL NEEDLE ASPIRATION BIOPSY  10/24/2019   Procedure: BRONCHIAL NEEDLE ASPIRATION BIOPSIES;  Surgeon: Leslye Peer, MD;  Location: [facility] ENDOSCOPY;  Service: Cardiopulmonary;;   BRONCHIAL NEEDLE ASPIRATION BIOPSY  11/05/2019   Procedure: BRONCHIAL NEEDLE ASPIRATION BIOPSIES;  Surgeon: Leslye Peer, MD;  Location: [facility] ENDOSCOPY;  Service: Pulmonary;;   ENDOBRONCHIAL ULTRASOUND N/A 10/24/2019   Procedure: ENDOBRONCHIAL ULTRASOUND;  Surgeon: Leslye Peer, MD;  Location: [facility] ENDOSCOPY;  Service: Cardiopulmonary;  Laterality: N/A;   FINGER SURGERY Right    Middle   IR IMAGING GUIDED PORT INSERTION  11/19/2019   VIDEO BRONCHOSCOPY N/A 10/24/2019   Procedure: VIDEO BRONCHOSCOPY WITHOUT FLUORO;  Surgeon: Leslye Peer, MD;  Location: [facility] ENDOSCOPY;  Service: Cardiopulmonary;  Laterality: N/A;   VIDEO BRONCHOSCOPY WITH ENDOBRONCHIAL NAVIGATION N/A 11/05/2019   Procedure: VIDEO BRONCHOSCOPY WITH ENDOBRONCHIAL NAVIGATION;  Surgeon: Leslye Peer, MD;  Location: [facility] ENDOSCOPY;  Service: Pulmonary;  Laterality: N/A;    REVIEW OF SYSTEMS:  Constitutional: negative Eyes: negative Ears, nose, mouth, throat, and face: negative Respiratory: positive for cough and wheezing Cardiovascular: negative Gastrointestinal: negative Genitourinary:negative Integument/breast: negative Hematologic/lymphatic: negative Musculoskeletal:negative Neurological: negative Behavioral/Psych: negative Endocrine: negative Allergic/Immunologic: negative   PHYSICAL EXAMINATION: General appearance: alert, cooperative, fatigued, and no distress Head: Normocephalic, without obvious abnormality, atraumatic Neck: no adenopathy, no JVD, supple, symmetrical, trachea midline, and thyroid not enlarged, symmetric, no tenderness/mass/nodules Lymph nodes: Cervical, supraclavicular, and axillary nodes normal. Resp: clear to auscultation bilaterally Back: symmetric, no curvature. ROM normal. No CVA tenderness. Cardio: regular rate and rhythm, S1, S2 normal, no murmur, click, rub or gallop GI: soft,  non-tender; bowel sounds normal; no masses,  no organomegaly Extremities: extremities normal, atraumatic, no cyanosis or edema Neurologic: Alert and oriented X 3, normal strength and tone. Normal symmetric reflexes. Normal coordination and gait  ECOG PERFORMANCE STATUS: 1 - Symptomatic but completely ambulatory  Blood pressure 135/81, pulse 100, temperature 97.6 F (36.4 C), temperature source Oral, resp. rate 18, height 5\' 8"  (1.727 m), weight 187 lb 14.4 oz (85.2 kg), SpO2 100 %.  LABORATORY DATA: Lab Results  Component Value Date   WBC 10.2 10/13/2022   HGB 13.9 10/13/2022   HCT 41.4 10/13/2022   MCV 90.4 10/13/2022   PLT 281 10/13/2022       Chemistry      Component Value Date/Time   NA 134 (L) 10/13/2022 0843   NA 141 03/08/2017 1707   K 4.6 10/13/2022 0843   CL 101 10/13/2022 0843   CO2 26 10/13/2022 0843   BUN 9 10/13/2022 0843   BUN 12 03/08/2017 1707   CREATININE 0.98 10/13/2022 0843      Component Value Date/Time   CALCIUM 9.3 10/13/2022 0843   ALKPHOS 97 10/13/2022 0843   AST 23 10/13/2022 0843   ALT 19 10/13/2022 0843   BILITOT 0.6 10/13/2022 0843       RADIOGRAPHIC STUDIES: CT Chest W Contrast  Result Date: 10/14/2022 CLINICAL DATA:  Metastatic non-small cell lung cancer restaging, ongoing chemotherapy * Tracking Code: BO * EXAM: CT CHEST, ABDOMEN, AND PELVIS WITH CONTRAST TECHNIQUE: Multidetector CT imaging of the chest, abdomen and pelvis was performed following the standard protocol during bolus administration of intravenous contrast. RADIATION DOSE REDUCTION: This exam was performed according to the departmental dose-optimization program which includes automated exposure control, adjustment of the mA and/or kV according to patient size and/or use of iterative reconstruction technique. CONTRAST:  OMNIPAQUE IOHEXOL 300 MG/ML SOLN additional oral enteric contrast COMPARISON:  07/09/2022 FINDINGS: CT CHEST FINDINGS Cardiovascular: Right chest port catheter. Aortic atherosclerosis. Normal heart size. Left coronary artery calcifications. Unchanged small pericardial effusion. Mediastinum/Nodes: No enlarged mediastinal, hilar, or axillary lymph nodes. Thyroid gland, trachea, and esophagus demonstrate no significant findings. Lungs/Pleura: Treated mass of the infrahilar right lower lobe has slightly enlarged, measuring 4.7 x 4.4 cm, previously 3.7 x 3.5 cm when measured similarly (series 3, image 47). Slight interval increase in postobstructive airspace disease and consolidation of the superior segment right lower lobe. Moderate underlying centrilobular and paraseptal emphysema. Diffuse bilateral bronchial wall  thickening. No pleural effusion or pneumothorax. Musculoskeletal: No chest wall abnormality. No acute osseous findings. CT ABDOMEN PELVIS FINDINGS Hepatobiliary: Somewhat shrunken, coarse contour of the liver. Hepatic steatosis. No solid liver abnormality is seen. No gallstones, gallbladder wall thickening, or biliary dilatation. Pancreas: Unremarkable. No pancreatic ductal dilatation or surrounding inflammatory changes. Spleen: Normal in size without significant abnormality. Adrenals/Urinary Tract: Adrenal glands are unremarkable. Kidneys are normal, without renal calculi, solid lesion, or hydronephrosis. Bladder is unremarkable. Stomach/Bowel: Stomach is within normal limits. Appendix appears normal. No evidence of bowel wall thickening, distention, or inflammatory changes. Vascular/Lymphatic: Severe mixed aortic atherosclerosis. Unchanged, long segment occlusion of the left common iliac, internal iliac, and external iliac arteries, with reconstitution of the left common femoral artery via epigastric collateral. No enlarged abdominal or pelvic lymph nodes. Reproductive: No mass or other abnormality. Other: No abdominal wall hernia or abnormality. No ascites. Musculoskeletal: No acute osseous findings. IMPRESSION: 1. Treated mass of the infrahilar right lower lobe has slightly enlarged, with slight interval increase in adjacent postobstructive airspace disease and consolidation of  the superior segment right lower lobe. Findings are concerning for locally worsened/recurrent malignancy. PET-CT may be helpful to assess for abnormal metabolic activity. 2. No evidence of lymphadenopathy or metastatic disease in the chest, abdomen, or pelvis. 3. Somewhat shrunken, coarse contour of the liver, suggestive of cirrhosis. Hepatic steatosis. 4. Unchanged, long segment occlusion of the left common iliac, internal iliac, and external iliac arteries, with reconstitution of the left common femoral artery via epigastric collateral.  5. Emphysema and diffuse bilateral bronchial wall thickening. 6. Coronary artery disease. Aortic Atherosclerosis (ICD10-I70.0) and Emphysema (ICD10-J43.9). Electronically Signed   By: Jearld Lesch M.D.   On: 10/14/2022 08:30   CT Abdomen Pelvis W Contrast  Result Date: 10/14/2022 CLINICAL DATA:  Metastatic non-small cell lung cancer restaging, ongoing chemotherapy * Tracking Code: BO * EXAM: CT CHEST, ABDOMEN, AND PELVIS WITH CONTRAST TECHNIQUE: Multidetector CT imaging of the chest, abdomen and pelvis was performed following the standard protocol during bolus administration of intravenous contrast. RADIATION DOSE REDUCTION: This exam was performed according to the departmental dose-optimization program which includes automated exposure control, adjustment of the mA and/or kV according to patient size and/or use of iterative reconstruction technique. CONTRAST:  OMNIPAQUE IOHEXOL 300 MG/ML SOLN additional oral enteric contrast COMPARISON:  07/09/2022 FINDINGS: CT CHEST FINDINGS Cardiovascular: Right chest port catheter. Aortic atherosclerosis. Normal heart size. Left coronary artery calcifications. Unchanged small pericardial effusion. Mediastinum/Nodes: No enlarged mediastinal, hilar, or axillary lymph nodes. Thyroid gland, trachea, and esophagus demonstrate no significant findings. Lungs/Pleura: Treated mass of the infrahilar right lower lobe has slightly enlarged, measuring 4.7 x 4.4 cm, previously 3.7 x 3.5 cm when measured similarly (series 3, image 47). Slight interval increase in postobstructive airspace disease and consolidation of the superior segment right lower lobe. Moderate underlying centrilobular and paraseptal emphysema. Diffuse bilateral bronchial wall thickening. No pleural effusion or pneumothorax. Musculoskeletal: No chest wall abnormality. No acute osseous findings. CT ABDOMEN PELVIS FINDINGS Hepatobiliary: Somewhat shrunken, coarse contour of the liver. Hepatic steatosis. No solid  liver abnormality is seen. No gallstones, gallbladder wall thickening, or biliary dilatation. Pancreas: Unremarkable. No pancreatic ductal dilatation or surrounding inflammatory changes. Spleen: Normal in size without significant abnormality. Adrenals/Urinary Tract: Adrenal glands are unremarkable. Kidneys are normal, without renal calculi, solid lesion, or hydronephrosis. Bladder is unremarkable. Stomach/Bowel: Stomach is within normal limits. Appendix appears normal. No evidence of bowel wall thickening, distention, or inflammatory changes. Vascular/Lymphatic: Severe mixed aortic atherosclerosis. Unchanged, long segment occlusion of the left common iliac, internal iliac, and external iliac arteries, with reconstitution of the left common femoral artery via epigastric collateral. No enlarged abdominal or pelvic lymph nodes. Reproductive: No mass or other abnormality. Other: No abdominal wall hernia or abnormality. No ascites. Musculoskeletal: No acute osseous findings. IMPRESSION: 1. Treated mass of the infrahilar right lower lobe has slightly enlarged, with slight interval increase in adjacent postobstructive airspace disease and consolidation of the superior segment right lower lobe. Findings are concerning for locally worsened/recurrent malignancy. PET-CT may be helpful to assess for abnormal metabolic activity. 2. No evidence of lymphadenopathy or metastatic disease in the chest, abdomen, or pelvis. 3. Somewhat shrunken, coarse contour of the liver, suggestive of cirrhosis. Hepatic steatosis. 4. Unchanged, long segment occlusion of the left common iliac, internal iliac, and external iliac arteries, with reconstitution of the left common femoral artery via epigastric collateral. 5. Emphysema and diffuse bilateral bronchial wall thickening. 6. Coronary artery disease. Aortic Atherosclerosis (ICD10-I70.0) and Emphysema (ICD10-J43.9). Electronically Signed   By: Jearld Lesch M.D.   On:  10/14/2022 08:30      ASSESSMENT AND PLAN: This is a very pleasant 62 years old white male with stage IV (T3, N0, M1c) non-small cell lung cancer, adenocarcinoma with MSI high presented with right lower lobe/infrahilar mass in addition to solitary brain metastasis in the left cerebellum diagnosed in July 2021. He is status post SRS to the solitary brain metastasis. The patient completed a course of concurrent chemoradiation with weekly carboplatin and paclitaxel.  He tolerated the treatment well except for fatigue and mild odynophagia. The patient has MSI high and I recommended for him treatment with immunotherapy with single agent Keytruda 200 mg IV every 3 weeks for a total of 2 years unless the patient has unacceptable toxicity or disease progression. He is status post 35  cycles of treatment with Keytruda.  He also received 7 cycles of Avastin for the vasogenic edema in the brain.   Avastin will be on hold for now unless needed in the future. The patient tolerated his previous treatment with immunotherapy fairly well. The patient has been on observation and he is feeling fine with no concerning complaints except for the mild cough and wheezing. He had repeat CT scan of the chest, abdomen and pelvis performed recently.  I personally and independently reviewed the scan images.  His scan showed no concerning findings for disease progression except for interval increase in the adjacent postobstructive airspace disease close to the treated mass of the infra right lower lobe concerning for local disease recurrence. I recommended for the patient to have a PET scan for further evaluation of this lesion and to rule out disease recurrence. I will see him back for follow-up visit in 3 weeks for evaluation and discussion of his scan results and further recommendation regarding his condition. The patient was advised to call immediately if he has any other concerning symptoms in the interval.  The patient voices understanding of  current disease status and treatment options and is in agreement with the current care plan.  All questions were answered. The patient knows to call the clinic with any problems, questions or concerns. We can certainly see the patient much sooner if necessary. The total time spent in the appointment was 30 minutes.  Disclaimer: This note was dictated with voice recognition software. Similar sounding words can inadvertently be transcribed and may not be corrected upon review.

## 2022-10-18 ENCOUNTER — Telehealth: Payer: Self-pay | Admitting: Internal Medicine

## 2022-10-18 NOTE — Telephone Encounter (Signed)
Scheduled per 06/24 los, patient has been called and voicemail was left. 

## 2022-11-04 ENCOUNTER — Other Ambulatory Visit (HOSPITAL_COMMUNITY): Payer: Managed Care, Other (non HMO)

## 2022-11-04 ENCOUNTER — Ambulatory Visit (HOSPITAL_COMMUNITY)
Admission: RE | Admit: 2022-11-04 | Discharge: 2022-11-04 | Disposition: A | Discharge status: Home / Self Care | Payer: Managed Care, Other (non HMO) | Source: Ambulatory Visit | Attending: Internal Medicine | Admitting: Internal Medicine

## 2022-11-04 DIAGNOSIS — C3490 Malignant neoplasm of unspecified part of unspecified bronchus or lung: Secondary | ICD-10-CM | POA: Insufficient documentation

## 2022-11-04 LAB — GLUCOSE, CAPILLARY: Glucose-Capillary: 84 mg/dL (ref 70–99)

## 2022-11-04 MED ORDER — FLUDEOXYGLUCOSE F - 18 (FDG) INJECTION
9.3000 | Freq: Once | INTRAVENOUS | Status: AC | PRN
  Administered 2022-11-04: 9.3 via INTRAVENOUS

## 2022-11-07 ENCOUNTER — Encounter: Payer: Self-pay | Admitting: Internal Medicine

## 2022-11-07 ENCOUNTER — Other Ambulatory Visit: Payer: Self-pay | Admitting: Internal Medicine

## 2022-11-07 ENCOUNTER — Inpatient Hospital Stay: Payer: Managed Care, Other (non HMO) | Attending: Internal Medicine | Admitting: Internal Medicine

## 2022-11-07 VITALS — BP 142/83 | HR 107 | Temp 98.1°F | Resp 20 | Ht 68.0 in | Wt 186.1 lb

## 2022-11-07 DIAGNOSIS — R062 Wheezing: Secondary | ICD-10-CM | POA: Diagnosis not present

## 2022-11-07 DIAGNOSIS — Z5112 Encounter for antineoplastic immunotherapy: Secondary | ICD-10-CM | POA: Diagnosis present

## 2022-11-07 DIAGNOSIS — Z923 Personal history of irradiation: Secondary | ICD-10-CM | POA: Diagnosis not present

## 2022-11-07 DIAGNOSIS — Z79620 Long term (current) use of immunosuppressive biologic: Secondary | ICD-10-CM | POA: Insufficient documentation

## 2022-11-07 DIAGNOSIS — C3431 Malignant neoplasm of lower lobe, right bronchus or lung: Secondary | ICD-10-CM | POA: Diagnosis not present

## 2022-11-07 DIAGNOSIS — R059 Cough, unspecified: Secondary | ICD-10-CM | POA: Insufficient documentation

## 2022-11-07 DIAGNOSIS — C3491 Malignant neoplasm of unspecified part of right bronchus or lung: Secondary | ICD-10-CM

## 2022-11-07 DIAGNOSIS — C7931 Secondary malignant neoplasm of brain: Secondary | ICD-10-CM | POA: Insufficient documentation

## 2022-11-07 MED ORDER — TEMAZEPAM 30 MG PO CAPS: 30.0000 mg | ORAL_CAPSULE | Freq: Every evening | ORAL | 0 refills | Status: DC | PRN

## 2022-11-07 NOTE — Progress Notes (Signed)
[facility] [phone]   [phone]  OFFICE PROGRESS NOTE  [name], NP [address] [name] [address] [place] [address]  DIAGNOSIS: Stage IV (T3, N0, M1C) non-small cell lung cancer, adenocarcinoma.  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 He was diagnosed in July 2021.   Molecular Biomarkers:  MSI-High DETECTED Pembrolizumab Atezolizumab, Avelumab, Cemiplimab, Dostarlimab, Durvalumab, Ipilimumab, Nivolumab   STK11Splice Site SNV 1.9% Everolimus, Temsirolimus Yes   [id number] 1.7% Binimetinib Yes   [id number] 0.4%   Niraparib, Olaparib, Rucaparib, Talazoparib, Tazemetostat Yes   PRIOR THERAPY:  1) SRS to the solitary brain metastasis under the care of Dr. [name]. Last treatment 11/14/19. 2) Weekly concurrent chemoradiation with carboplatin for an AUC of 2, paclitaxel 45 mg/m2.  First dose expected on 11/25/2019. Status post 7 cycles, last dose was given 01/06/2020 with partial response.  3)  Immunotherapy with Keytruda 200 mg IV every 3 weeks.  First dose February 10, 2020 for a patient with MSI high.  Status post 35  cycles. 4) Avastin 15 mg/KG every 3 weeks.  First dose today for the vasogenic edema of the brain.S/P 7 cycles. 5) The patient had evidence for local disease recurrence in July 2024.   CURRENT THERAPY: Resuming his treatment with Keytruda 200 Mg IV every 3 weeks.  First dose 11/16/2022.   INTERVAL HISTORY: Joseph Atkins 62 y.o. male returns to the clinic today for follow-up visit accompanied by his wife.  He is feeling fine today with no concerning complaints except for lack of sleep and shortness of breath with exertion.  He denied having any chest pain, cough or hemoptysis.  He has no nausea, vomiting, diarrhea or constipation.  He denied having any headache or visual changes.  He has no recent weight loss or night sweats.   He was found on previous CT scan of the chest to have concerning findings for local disease recurrence in the right hilar area.  I ordered a PET scan which was performed recently and the patient is here for evaluation and discussion of his PET scan results and treatment options.  MEDICAL HISTORY: Past Medical History:  Diagnosis Date   Cancer (HCC)    lung cancer   Diverticulosis    GERD (gastroesophageal reflux disease)    Hx of small bowel obstruction    Hyperlipidemia    Hypertension    Hypothyroidism    IBS (irritable bowel syndrome)    Substance abuse (HCC)    Alcoholic, Drug addition   Thyroid disease     ALLERGIES:  is allergic to penicillins.  MEDICATIONS:  Current Outpatient Medications  Medication Sig Dispense Refill   albuterol (PROVENTIL HFA;VENTOLIN HFA) 108 (90 Base) MCG/ACT inhaler Inhale 2 puffs into the lungs every 6 (six) hours as needed for wheezing or shortness of breath. 1 Inhaler 0   amLODipine (NORVASC) 5 MG tablet TAKE 1 TABLET (5 MG TOTAL) BY MOUTH DAILY. 90 tablet 1   ANORO ELLIPTA 62.5-25 MCG/INH AEPB 1 puff daily.     augmented betamethasone dipropionate (DIPROLENE-AF) 0.05 % cream Apply topically 2 (two) times daily.     [employer] ASPIRIN LOW DOSE 81 MG tablet TAKE 1 TABLET (81 MG TOTAL) BY MOUTH DAILY. SWALLOW WHOLE. 90 tablet 3   cyclobenzaprine (FLEXERIL) 10 MG tablet Take 10 mg by mouth 2 (two) times daily as needed for muscle spasms.  docusate sodium (COLACE) 100 MG capsule Take 100 mg by mouth daily.     DULoxetine (CYMBALTA) 20 MG capsule Take 1 capsule (20 mg total) by mouth 2 (two) times daily. 60 capsule 5   fluticasone furoate-vilanterol (BREO ELLIPTA) 100-25 MCG/INH AEPB Inhale 1 puff into the lungs daily. 30 each 3   hydrOXYzine (ATARAX) 25 MG tablet Take 25 mg by mouth at bedtime.     levothyroxine (SYNTHROID) 100 MCG tablet TAKE 1 TABLET BY MOUTH EVERY DAY 90 tablet 1   lidocaine-prilocaine (EMLA) cream Apply 1 application topically as  needed. 30 g 0   omeprazole (PRILOSEC) 40 MG capsule Take 1 capsule (40 mg total) by mouth daily. 90 capsule 4   ondansetron (ZOFRAN) 4 MG tablet Take 1 tablet (4 mg total) by mouth every 8 (eight) hours as needed. 40 tablet 2   oxyCODONE-acetaminophen (PERCOCET/ROXICET) 5-325 MG tablet Take 1 tablet by mouth every 4 (four) hours as needed for severe pain. 30 tablet 0   pembrolizumab (KEYTRUDA) 100 MG/4ML SOLN See admin instructions.     polyethylene glycol powder (MIRALAX) powder Take 17 g by mouth daily. 255 g 11   prochlorperazine (COMPAZINE) 10 MG tablet Take 1 tablet (10 mg total) by mouth every 6 (six) hours as needed. 30 tablet 2   triamcinolone cream (KENALOG) 0.1 % APPLY TOPICALLY 2 TIMES DAILY AS NEEDED. 454 g 0   TRULANCE 3 MG TABS Take 1 tablet by mouth daily.     zolpidem (AMBIEN CR) 12.5 MG CR tablet Take 1 tablet (12.5 mg total) by mouth at bedtime as needed for sleep. (Patient not taking: Reported on 07/19/2022) 30 tablet 5   No current facility-administered medications for this visit.    SURGICAL HISTORY:  Past Surgical History:  Procedure Laterality Date   arm surgery Right    BRONCHIAL BRUSHINGS  10/24/2019   Procedure: BRONCHIAL BRUSHINGS;  Surgeon: Leslye Peer, MD;  Location: [facility] ENDOSCOPY;  Service: Cardiopulmonary;;  right lower lobe    BRONCHIAL BRUSHINGS  11/05/2019   Procedure: BRONCHIAL BRUSHINGS;  Surgeon: Leslye Peer, MD;  Location: [facility] ENDOSCOPY;  Service: Pulmonary;;   BRONCHIAL NEEDLE ASPIRATION BIOPSY  10/24/2019   Procedure: BRONCHIAL NEEDLE ASPIRATION BIOPSIES;  Surgeon: Leslye Peer, MD;  Location: [facility] ENDOSCOPY;  Service: Cardiopulmonary;;   BRONCHIAL NEEDLE ASPIRATION BIOPSY  11/05/2019   Procedure: BRONCHIAL NEEDLE ASPIRATION BIOPSIES;  Surgeon: Leslye Peer, MD;  Location: [facility] ENDOSCOPY;  Service: Pulmonary;;   ENDOBRONCHIAL ULTRASOUND N/A 10/24/2019   Procedure: ENDOBRONCHIAL ULTRASOUND;  Surgeon: Leslye Peer, MD;  Location: [facility] ENDOSCOPY;   Service: Cardiopulmonary;  Laterality: N/A;   FINGER SURGERY Right    Middle   IR IMAGING GUIDED PORT INSERTION  11/19/2019   VIDEO BRONCHOSCOPY N/A 10/24/2019   Procedure: VIDEO BRONCHOSCOPY WITHOUT FLUORO;  Surgeon: Leslye Peer, MD;  Location: [facility] ENDOSCOPY;  Service: Cardiopulmonary;  Laterality: N/A;   VIDEO BRONCHOSCOPY WITH ENDOBRONCHIAL NAVIGATION N/A 11/05/2019   Procedure: VIDEO BRONCHOSCOPY WITH ENDOBRONCHIAL NAVIGATION;  Surgeon: Leslye Peer, MD;  Location: [facility] ENDOSCOPY;  Service: Pulmonary;  Laterality: N/A;    REVIEW OF SYSTEMS:  Constitutional: negative Eyes: negative Ears, nose, mouth, throat, and face: negative Respiratory: positive for dyspnea on exertion Cardiovascular: negative Gastrointestinal: negative Genitourinary:negative Integument/breast: negative Hematologic/lymphatic: negative Musculoskeletal:negative Neurological: negative Behavioral/Psych: positive for sleep disturbance Endocrine: negative Allergic/Immunologic: negative   PHYSICAL EXAMINATION: General appearance: alert, cooperative, fatigued, and no distress Head: Normocephalic, without obvious abnormality, atraumatic Neck: no adenopathy, no JVD, supple, symmetrical,  trachea midline, and thyroid not enlarged, symmetric, no tenderness/mass/nodules Lymph nodes: Cervical, supraclavicular, and axillary nodes normal. Resp: clear to auscultation bilaterally Back: symmetric, no curvature. ROM normal. No CVA tenderness. Cardio: regular rate and rhythm, S1, S2 normal, no murmur, click, rub or gallop GI: soft, non-tender; bowel sounds normal; no masses,  no organomegaly Extremities: extremities normal, atraumatic, no cyanosis or edema Neurologic: Alert and oriented X 3, normal strength and tone. Normal symmetric reflexes. Normal coordination and gait  ECOG PERFORMANCE STATUS: 1 - Symptomatic but completely ambulatory  Blood pressure (!) 142/83, pulse (!) 107, temperature 98.1 F (36.7 C), temperature  source Oral, resp. rate 20, height 5\' 8"  (1.727 m), weight 186 lb 1 oz (84.4 kg), SpO2 99%.  LABORATORY DATA: Lab Results  Component Value Date   WBC 10.2 10/13/2022   HGB 13.9 10/13/2022   HCT 41.4 10/13/2022   MCV 90.4 10/13/2022   PLT 281 10/13/2022      Chemistry      Component Value Date/Time   NA 134 (L) 10/13/2022 0843   NA 141 03/08/2017 1707   K 4.6 10/13/2022 0843   CL 101 10/13/2022 0843   CO2 26 10/13/2022 0843   BUN 9 10/13/2022 0843   BUN 12 03/08/2017 1707   CREATININE 0.98 10/13/2022 0843      Component Value Date/Time   CALCIUM 9.3 10/13/2022 0843   ALKPHOS 97 10/13/2022 0843   AST 23 10/13/2022 0843   ALT 19 10/13/2022 0843   BILITOT 0.6 10/13/2022 0843       RADIOGRAPHIC STUDIES: CT Chest W Contrast  Result Date: 10/14/2022 CLINICAL DATA:  Metastatic non-small cell lung cancer restaging, ongoing chemotherapy * Tracking Code: BO * EXAM: CT CHEST, ABDOMEN, AND PELVIS WITH CONTRAST TECHNIQUE: Multidetector CT imaging of the chest, abdomen and pelvis was performed following the standard protocol during bolus administration of intravenous contrast. RADIATION DOSE REDUCTION: This exam was performed according to the departmental dose-optimization program which includes automated exposure control, adjustment of the mA and/or kV according to patient size and/or use of iterative reconstruction technique. CONTRAST:  OMNIPAQUE IOHEXOL 300 MG/ML SOLN additional oral enteric contrast COMPARISON:  07/09/2022 FINDINGS: CT CHEST FINDINGS Cardiovascular: Right chest port catheter. Aortic atherosclerosis. Normal heart size. Left coronary artery calcifications. Unchanged small pericardial effusion. Mediastinum/Nodes: No enlarged mediastinal, hilar, or axillary lymph nodes. Thyroid gland, trachea, and esophagus demonstrate no significant findings. Lungs/Pleura: Treated mass of the infrahilar right lower lobe has slightly enlarged, measuring 4.7 x 4.4 cm, previously 3.7 x 3.5  cm when measured similarly (series 3, image 47). Slight interval increase in postobstructive airspace disease and consolidation of the superior segment right lower lobe. Moderate underlying centrilobular and paraseptal emphysema. Diffuse bilateral bronchial wall thickening. No pleural effusion or pneumothorax. Musculoskeletal: No chest wall abnormality. No acute osseous findings. CT ABDOMEN PELVIS FINDINGS Hepatobiliary: Somewhat shrunken, coarse contour of the liver. Hepatic steatosis. No solid liver abnormality is seen. No gallstones, gallbladder wall thickening, or biliary dilatation. Pancreas: Unremarkable. No pancreatic ductal dilatation or surrounding inflammatory changes. Spleen: Normal in size without significant abnormality. Adrenals/Urinary Tract: Adrenal glands are unremarkable. Kidneys are normal, without renal calculi, solid lesion, or hydronephrosis. Bladder is unremarkable. Stomach/Bowel: Stomach is within normal limits. Appendix appears normal. No evidence of bowel wall thickening, distention, or inflammatory changes. Vascular/Lymphatic: Severe mixed aortic atherosclerosis. Unchanged, long segment occlusion of the left common iliac, internal iliac, and external iliac arteries, with reconstitution of the left common femoral artery via epigastric collateral. No enlarged abdominal or  pelvic lymph nodes. Reproductive: No mass or other abnormality. Other: No abdominal wall hernia or abnormality. No ascites. Musculoskeletal: No acute osseous findings. IMPRESSION: 1. Treated mass of the infrahilar right lower lobe has slightly enlarged, with slight interval increase in adjacent postobstructive airspace disease and consolidation of the superior segment right lower lobe. Findings are concerning for locally worsened/recurrent malignancy. PET-CT may be helpful to assess for abnormal metabolic activity. 2. No evidence of lymphadenopathy or metastatic disease in the chest, abdomen, or pelvis. 3. Somewhat  shrunken, coarse contour of the liver, suggestive of cirrhosis. Hepatic steatosis. 4. Unchanged, long segment occlusion of the left common iliac, internal iliac, and external iliac arteries, with reconstitution of the left common femoral artery via epigastric collateral. 5. Emphysema and diffuse bilateral bronchial wall thickening. 6. Coronary artery disease. Aortic Atherosclerosis (ICD10-I70.0) and Emphysema (ICD10-J43.9). Electronically Signed   By: Jearld Lesch M.D.   On: 10/14/2022 08:30   CT Abdomen Pelvis W Contrast  Result Date: 10/14/2022 CLINICAL DATA:  Metastatic non-small cell lung cancer restaging, ongoing chemotherapy * Tracking Code: BO * EXAM: CT CHEST, ABDOMEN, AND PELVIS WITH CONTRAST TECHNIQUE: Multidetector CT imaging of the chest, abdomen and pelvis was performed following the standard protocol during bolus administration of intravenous contrast. RADIATION DOSE REDUCTION: This exam was performed according to the departmental dose-optimization program which includes automated exposure control, adjustment of the mA and/or kV according to patient size and/or use of iterative reconstruction technique. CONTRAST:  OMNIPAQUE IOHEXOL 300 MG/ML SOLN additional oral enteric contrast COMPARISON:  07/09/2022 FINDINGS: CT CHEST FINDINGS Cardiovascular: Right chest port catheter. Aortic atherosclerosis. Normal heart size. Left coronary artery calcifications. Unchanged small pericardial effusion. Mediastinum/Nodes: No enlarged mediastinal, hilar, or axillary lymph nodes. Thyroid gland, trachea, and esophagus demonstrate no significant findings. Lungs/Pleura: Treated mass of the infrahilar right lower lobe has slightly enlarged, measuring 4.7 x 4.4 cm, previously 3.7 x 3.5 cm when measured similarly (series 3, image 47). Slight interval increase in postobstructive airspace disease and consolidation of the superior segment right lower lobe. Moderate underlying centrilobular and paraseptal emphysema.  Diffuse bilateral bronchial wall thickening. No pleural effusion or pneumothorax. Musculoskeletal: No chest wall abnormality. No acute osseous findings. CT ABDOMEN PELVIS FINDINGS Hepatobiliary: Somewhat shrunken, coarse contour of the liver. Hepatic steatosis. No solid liver abnormality is seen. No gallstones, gallbladder wall thickening, or biliary dilatation. Pancreas: Unremarkable. No pancreatic ductal dilatation or surrounding inflammatory changes. Spleen: Normal in size without significant abnormality. Adrenals/Urinary Tract: Adrenal glands are unremarkable. Kidneys are normal, without renal calculi, solid lesion, or hydronephrosis. Bladder is unremarkable. Stomach/Bowel: Stomach is within normal limits. Appendix appears normal. No evidence of bowel wall thickening, distention, or inflammatory changes. Vascular/Lymphatic: Severe mixed aortic atherosclerosis. Unchanged, long segment occlusion of the left common iliac, internal iliac, and external iliac arteries, with reconstitution of the left common femoral artery via epigastric collateral. No enlarged abdominal or pelvic lymph nodes. Reproductive: No mass or other abnormality. Other: No abdominal wall hernia or abnormality. No ascites. Musculoskeletal: No acute osseous findings. IMPRESSION: 1. Treated mass of the infrahilar right lower lobe has slightly enlarged, with slight interval increase in adjacent postobstructive airspace disease and consolidation of the superior segment right lower lobe. Findings are concerning for locally worsened/recurrent malignancy. PET-CT may be helpful to assess for abnormal metabolic activity. 2. No evidence of lymphadenopathy or metastatic disease in the chest, abdomen, or pelvis. 3. Somewhat shrunken, coarse contour of the liver, suggestive of cirrhosis. Hepatic steatosis. 4. Unchanged, long segment occlusion of the left common  iliac, internal iliac, and external iliac arteries, with reconstitution of the left common femoral  artery via epigastric collateral. 5. Emphysema and diffuse bilateral bronchial wall thickening. 6. Coronary artery disease. Aortic Atherosclerosis (ICD10-I70.0) and Emphysema (ICD10-J43.9). Electronically Signed   By: Jearld Lesch M.D.   On: 10/14/2022 08:30     ASSESSMENT AND PLAN: This is a very pleasant 62 years old white male with stage IV (T3, N0, M1c) non-small cell lung cancer, adenocarcinoma with MSI high presented with right lower lobe/infrahilar mass in addition to solitary brain metastasis in the left cerebellum diagnosed in July 2021. He is status post SRS to the solitary brain metastasis. The patient completed a course of concurrent chemoradiation with weekly carboplatin and paclitaxel.  He tolerated the treatment well except for fatigue and mild odynophagia. The patient has MSI high and I recommended for him treatment with immunotherapy with single agent Keytruda 200 mg IV every 3 weeks for a total of 2 years unless the patient has unacceptable toxicity or disease progression. He is status post 35  cycles of treatment with Keytruda.  He also received 7 cycles of Avastin for the vasogenic edema in the brain.   Avastin will be on hold for now unless needed in the future. The patient tolerated his previous treatment with immunotherapy fairly well. The patient has been on observation and he is feeling fine with no concerning complaints except for the mild cough and wheezing. His restaging scan in June 2024 showed no concerning findings for disease progression except for interval increase in the adjacent postobstructive airspace disease close to the treated mass of the infra right lower lobe concerning for local disease recurrence.  He had a PET scan performed on 11/04/2022.  I personally and independently reviewed the scan images before the final report became available.  It showed increased radiotracer uptake associated with the masslike architectural distortion within the central aspect of the  right lower lobe concerning for local disease recurrence.  But there was no evidence for nodal metastasis or distant metastatic disease. I discussed the result with the patient and his wife.  The patient received curative radiotherapy to this area in the past and he may not be a great candidate for reirradiation.  He has MSI high and I recommended for him to consider resuming his treatment with Keytruda again at 200 Mg IV every 3 weeks.  He was also given the option of close monitoring and observation. The patient and his wife are interested in treatment and he is expected to start the first cycle of this treatment next week.  He is very familiar with the adverse effect of this treatment. The patient will come back for follow-up visit in 4 weeks for evaluation with the start of cycle #2. He was advised to call immediately if he has any other concerning symptoms in the interval.  The patient voices understanding of current disease status and treatment options and is in agreement with the current care plan.  All questions were answered. The patient knows to call the clinic with any problems, questions or concerns. We can certainly see the patient much sooner if necessary. The total time spent in the appointment was 30 minutes.  Disclaimer: This note was dictated with voice recognition software. Similar sounding words can inadvertently be transcribed and may not be corrected upon review.

## 2022-11-07 NOTE — Progress Notes (Signed)
OFF PATHWAY REGIMEN - Non-Small Cell Lung  No Change  Continue With Treatment as Ordered.  Original Decision Date/Time: 02/03/2020 08:41   [id number]:Pembrolizumab 200 mg IV D1 q21 Days:   A cycle is every 21 days:     Pembrolizumab   **Always confirm dose/schedule in your pharmacy ordering system**  Patient Characteristics: Stage IV Metastatic, Nonsquamous, Initial Chemotherapy/Immunotherapy, PS = 0, 1, ALK Rearrangement Negative and ROS1 Rearrangement Negative and NTRK Gene Fusion?Negative and RET Gene Fusion?Negative and EGFR Mutation Negative, PD-L1 Expression Positive  1-49% (TPS) / Negative / Not Tested / Awaiting Test Results and Immunotherapy Candidate Therapeutic Status: Stage IV Metastatic Histology: Nonsquamous Cell ROS1 Rearrangement Status: Negative Other Mutations/Biomarkers: Yes Chemotherapy/Immunotherapy LOT: Initial Chemotherapy/Immunotherapy Molecular Targeted Therapy: Not Appropriate KRAS G12C Mutation Status: Negative MET Exon 14 Mutation Status: Negative RET Gene Fusion Status: Negative EGFR Mutation Status: Negative/Wild Type NTRK Gene Fusion Status: Negative PD-L1 Expression Status: Quantity Not Sufficient ALK Rearrangement Status: Negative BRAF V600E Mutation Status: Negative ECOG Performance Status: 1 Biomarker Assessment Status Confirmation: All Genomic Markers Negative, or Only MET+ or BRAF+ or KRAS G12C+ Immunotherapy Candidate Status: Candidate for Immunotherapy Intent of Therapy: Non-Curative / Palliative Intent, Discussed with Patient

## 2022-11-08 ENCOUNTER — Other Ambulatory Visit: Payer: Self-pay

## 2022-11-10 ENCOUNTER — Encounter: Payer: Self-pay | Admitting: Internal Medicine

## 2022-11-15 ENCOUNTER — Telehealth: Payer: Self-pay | Admitting: Medical Oncology

## 2022-11-15 NOTE — Telephone Encounter (Signed)
Joseph Atkins "  [employer] leadership determined [facility] was out of network for the  [employer] .    However , in order to continue his care they authorized it from 07/17-11/14 . ( # [id number])   The authorization is for "Keytruda 200 u/dose and a total of 1400 u -   7 doses .

## 2022-11-16 ENCOUNTER — Inpatient Hospital Stay: Payer: Managed Care, Other (non HMO)

## 2022-11-16 VITALS — BP 128/94 | HR 100 | Temp 98.1°F | Resp 18 | Wt 187.0 lb

## 2022-11-16 DIAGNOSIS — Z5112 Encounter for antineoplastic immunotherapy: Secondary | ICD-10-CM | POA: Diagnosis not present

## 2022-11-16 DIAGNOSIS — C3491 Malignant neoplasm of unspecified part of right bronchus or lung: Secondary | ICD-10-CM

## 2022-11-16 DIAGNOSIS — Z95828 Presence of other vascular implants and grafts: Secondary | ICD-10-CM

## 2022-11-16 LAB — CBC WITH DIFFERENTIAL (CANCER CENTER ONLY)
Abs Immature Granulocytes: 0.04 10*3/uL (ref 0.00–0.07)
Basophils Absolute: 0.1 10*3/uL (ref 0.0–0.1)
Basophils Relative: 1 %
Eosinophils Absolute: 0.2 10*3/uL (ref 0.0–0.5)
Eosinophils Relative: 3 %
HCT: 37.4 % — ABNORMAL LOW (ref 39.0–52.0)
Hemoglobin: 12.2 g/dL — ABNORMAL LOW (ref 13.0–17.0)
Immature Granulocytes: 1 %
Lymphocytes Relative: 16 %
Lymphs Abs: 1.3 10*3/uL (ref 0.7–4.0)
MCH: 29 pg (ref 26.0–34.0)
MCHC: 32.6 g/dL (ref 30.0–36.0)
MCV: 88.8 fL (ref 80.0–100.0)
Monocytes Absolute: 0.7 10*3/uL (ref 0.1–1.0)
Monocytes Relative: 8 %
Neutro Abs: 6.2 10*3/uL (ref 1.7–7.7)
Neutrophils Relative %: 71 %
Platelet Count: 337 10*3/uL (ref 150–400)
RBC: 4.21 MIL/uL — ABNORMAL LOW (ref 4.22–5.81)
RDW: 13.5 % (ref 11.5–15.5)
WBC Count: 8.6 10*3/uL (ref 4.0–10.5)
nRBC: 0 % (ref 0.0–0.2)

## 2022-11-16 LAB — CMP (CANCER CENTER ONLY)
ALT: 25 U/L (ref 0–44)
AST: 21 U/L (ref 15–41)
Albumin: 3.9 g/dL (ref 3.5–5.0)
Alkaline Phosphatase: 106 U/L (ref 38–126)
Anion gap: 8 (ref 5–15)
BUN: 9 mg/dL (ref 6–20)
CO2: 25 mmol/L (ref 22–32)
Calcium: 8.9 mg/dL (ref 8.9–10.3)
Chloride: 102 mmol/L (ref 98–111)
Creatinine: 0.91 mg/dL (ref 0.61–1.24)
GFR, Estimated: >60 mL/min (ref >60)
Glucose, Bld: 142 mg/dL — ABNORMAL HIGH (ref 70–99)
Potassium: 3.7 mmol/L (ref 3.5–5.1)
Sodium: 135 mmol/L (ref 135–145)
Total Bilirubin: 0.4 mg/dL (ref 0.3–1.2)
Total Protein: 6.7 g/dL (ref 6.5–8.1)

## 2022-11-16 LAB — TSH: TSH: 4.14 u[IU]/mL (ref 0.350–4.500)

## 2022-11-16 MED ORDER — SODIUM CHLORIDE 0.9 % IV SOLN: Freq: Once | INTRAVENOUS | Status: AC

## 2022-11-16 MED ORDER — SODIUM CHLORIDE 0.9% FLUSH
10.0000 mL | INTRAVENOUS | Status: DC | PRN
  Administered 2022-11-16: 10 mL

## 2022-11-16 MED ORDER — HEPARIN SOD (PORK) LOCK FLUSH 100 UNIT/ML IV SOLN
500.0000 [IU] | Freq: Once | INTRAVENOUS | Status: AC | PRN
  Administered 2022-11-16: 500 [IU]

## 2022-11-16 MED ORDER — SODIUM CHLORIDE 0.9 % IV SOLN
200.0000 mg | Freq: Once | INTRAVENOUS | Status: AC
  Administered 2022-11-16: 200 mg via INTRAVENOUS
  Filled 2022-11-16: qty 200

## 2022-11-16 NOTE — Progress Notes (Signed)
Pt presented to infusion today with c/o worsened leg weakness and "wobbly knees". Pt informed this RN that he cannot stand for long periods of time and has started using a cane to help with mobility.  This RN made Dr. Arbutus Ped and [facility] PA-C aware.  Per Dr.[name] OK to proceed with tx today, he will continue to monitor leg weakness and will consult Dr. Barbaraann Cao if indicated for neurological symptoms. Pt informed to notify tx team if symptoms worsen.

## 2022-11-16 NOTE — Patient Instructions (Signed)
Joseph Atkins  Discharge Instructions: Thank you for choosing [facility] to provide your oncology and hematology care.   If you have a lab appointment with the [facility], please go directly to the [facility] and check in at the registration area.   Wear comfortable clothing and clothing appropriate for easy access to any Portacath or PICC line.   We strive to give you quality time with your provider. You may need to reschedule your appointment if you arrive late (15 or more minutes).  Arriving late affects you and other patients whose appointments are after yours.  Also, if you miss three or more appointments without notifying the office, you may be dismissed from the clinic at the provider's discretion.      For prescription refill requests, have your pharmacy contact our office and allow 72 hours for refills to be completed.    Today you received the following chemotherapy and/or immunotherapy agents: Keytruda      To help prevent nausea and vomiting after your treatment, we encourage you to take your nausea medication as directed.  BELOW ARE SYMPTOMS THAT SHOULD BE REPORTED IMMEDIATELY: *FEVER GREATER THAN 100.4 F (38 C) OR HIGHER *CHILLS OR SWEATING *NAUSEA AND VOMITING THAT IS NOT CONTROLLED WITH YOUR NAUSEA MEDICATION *UNUSUAL SHORTNESS OF BREATH *UNUSUAL BRUISING OR BLEEDING *URINARY PROBLEMS (pain or burning when urinating, or frequent urination) *BOWEL PROBLEMS (unusual diarrhea, constipation, pain near the anus) TENDERNESS IN MOUTH AND THROAT WITH OR WITHOUT PRESENCE OF ULCERS (sore throat, sores in mouth, or a toothache) UNUSUAL RASH, SWELLING OR PAIN  UNUSUAL VAGINAL DISCHARGE OR ITCHING   Items with * indicate a potential emergency and should be followed up as soon as possible or go to the Emergency Department if any problems should occur.  Please show the CHEMOTHERAPY ALERT CARD or IMMUNOTHERAPY ALERT CARD at  check-in to the Emergency Department and triage nurse.  Should you have questions after your visit or need to cancel or reschedule your appointment, please contact [facility]  Dept: [phone]  and follow the prompts.  Office hours are 8:00 a.m. to 4:30 p.m. Monday - Friday. Please note that voicemails left after 4:00 p.m. may not be returned until the following business day.  We are closed weekends and major holidays. You have access to a nurse at all times for urgent questions. Please call the main number to the clinic Dept: [phone] and follow the prompts.   For any non-urgent questions, you may also contact your provider using MyChart. We now offer e-Visits for anyone 14 and older to request care online for non-urgent symptoms. For details visit [email or web address].   Also download the MyChart app! Go to the app store, search "MyChart", open the app, select [facility], and log in with your MyChart username and password.

## 2022-11-17 LAB — T4: T4, Total: 8.5 ug/dL (ref 4.5–12.0)

## 2022-11-23 ENCOUNTER — Other Ambulatory Visit: Payer: Self-pay

## 2022-11-28 ENCOUNTER — Telehealth: Payer: Self-pay | Admitting: Radiation Therapy

## 2022-11-28 NOTE — Telephone Encounter (Signed)
Mr. Bodenschatz wife called reporting new symptoms for Joseph Atkins. He is having Rt leg swelling from the knee down with weakness, Rt arm swelling, and headache that creates pressure when he coughs that makes him feel like he is going to pass out. His next MRI is not scheduled until September. They are asking to be seen by Dr. Barbaraann Cao earlier for evaluation.   I will make Creola Corn, RN aware.   Jalene Mullet R.T.(R)(T) Radiation Special Procedures Navigator

## 2022-12-01 ENCOUNTER — Other Ambulatory Visit: Payer: Self-pay | Admitting: *Deleted

## 2022-12-01 ENCOUNTER — Inpatient Hospital Stay: Payer: Managed Care, Other (non HMO) | Attending: Internal Medicine | Admitting: Internal Medicine

## 2022-12-01 ENCOUNTER — Other Ambulatory Visit: Payer: Self-pay

## 2022-12-01 DIAGNOSIS — K219 Gastro-esophageal reflux disease without esophagitis: Secondary | ICD-10-CM | POA: Insufficient documentation

## 2022-12-01 DIAGNOSIS — E039 Hypothyroidism, unspecified: Secondary | ICD-10-CM | POA: Insufficient documentation

## 2022-12-01 DIAGNOSIS — Z7982 Long term (current) use of aspirin: Secondary | ICD-10-CM | POA: Diagnosis not present

## 2022-12-01 DIAGNOSIS — C7931 Secondary malignant neoplasm of brain: Secondary | ICD-10-CM

## 2022-12-01 DIAGNOSIS — K589 Irritable bowel syndrome without diarrhea: Secondary | ICD-10-CM | POA: Diagnosis not present

## 2022-12-01 DIAGNOSIS — F1010 Alcohol abuse, uncomplicated: Secondary | ICD-10-CM | POA: Insufficient documentation

## 2022-12-01 DIAGNOSIS — I10 Essential (primary) hypertension: Secondary | ICD-10-CM | POA: Insufficient documentation

## 2022-12-01 DIAGNOSIS — C3491 Malignant neoplasm of unspecified part of right bronchus or lung: Secondary | ICD-10-CM | POA: Insufficient documentation

## 2022-12-01 DIAGNOSIS — Z79890 Hormone replacement therapy (postmenopausal): Secondary | ICD-10-CM | POA: Insufficient documentation

## 2022-12-01 DIAGNOSIS — R5383 Other fatigue: Secondary | ICD-10-CM | POA: Insufficient documentation

## 2022-12-01 DIAGNOSIS — Z87891 Personal history of nicotine dependence: Secondary | ICD-10-CM | POA: Diagnosis not present

## 2022-12-01 DIAGNOSIS — Z7951 Long term (current) use of inhaled steroids: Secondary | ICD-10-CM | POA: Insufficient documentation

## 2022-12-01 DIAGNOSIS — Z801 Family history of malignant neoplasm of trachea, bronchus and lung: Secondary | ICD-10-CM | POA: Insufficient documentation

## 2022-12-01 DIAGNOSIS — Z79899 Other long term (current) drug therapy: Secondary | ICD-10-CM | POA: Diagnosis not present

## 2022-12-01 DIAGNOSIS — E785 Hyperlipidemia, unspecified: Secondary | ICD-10-CM | POA: Diagnosis not present

## 2022-12-01 DIAGNOSIS — Z5112 Encounter for antineoplastic immunotherapy: Secondary | ICD-10-CM | POA: Diagnosis not present

## 2022-12-01 MED ORDER — DEXAMETHASONE 2 MG PO TABS: 2.0000 mg | ORAL_TABLET | Freq: Every day | ORAL | 0 refills | Status: DC

## 2022-12-01 NOTE — Progress Notes (Signed)
[facility] at [facility] [address]  [place], [place] [address] [phone]   Interval Evaluation  Date of Service: 12/01/22 Patient Name: Joseph Atkins Patient MRN: [id number] Patient DOB: August 22, 1960 Provider: Henreitta Leber, MD  Identifying Statement:  Joseph Atkins is a 62 y.o. male with Malignant neoplasm metastatic to brain [facility]) [C79.31]   Primary Cancer:  Oncologic History: Oncology History  Adenocarcinoma of right lung, stage 4 (HCC)  11/14/2019 Initial Diagnosis   Adenocarcinoma of right lung, stage 4 (HCC)   11/25/2019 - 01/06/2020 Chemotherapy   The patient had palonosetron (ALOXI) injection 0.25 mg, 0.25 mg, Intravenous,  Once, 7 of 7 cycles Administration: 0.25 mg (11/25/2019), 0.25 mg (12/23/2019), 0.25 mg (12/31/2019), 0.25 mg (12/02/2019), 0.25 mg (01/06/2020), 0.25 mg (12/09/2019), 0.25 mg (12/16/2019) CARBOplatin (PARAPLATIN) 250 mg in sodium chloride 0.9 % 250 mL chemo infusion, 249.8 mg (100 % of original dose 249.8 mg), Intravenous,  Once, 7 of 7 cycles Dose modification: 249.8 mg (original dose 249.8 mg, Cycle 1) Administration: 250 mg (11/25/2019), 250 mg (12/23/2019), 250 mg (12/31/2019), 250 mg (12/02/2019), 250 mg (01/06/2020), 270 mg (12/09/2019), 250 mg (12/16/2019) PACLitaxel (TAXOL) 90 mg in sodium chloride 0.9 % 250 mL chemo infusion (</= 80mg /m2), 45 mg/m2 = 90 mg, Intravenous,  Once, 7 of 7 cycles Administration: 90 mg (11/25/2019), 90 mg (12/23/2019), 90 mg (12/31/2019), 90 mg (12/02/2019), 90 mg (01/06/2020), 90 mg (12/09/2019), 90 mg (12/16/2019)  for chemotherapy treatment.    02/18/2020 - 03/23/2022 Chemotherapy   Patient is on Treatment Plan : LUNG NSCLC Pembrolizumab (200) q21d     03/31/2020 - 07/21/2020 Chemotherapy    Patient is on Treatment Plan: LUNG NSCLC FLAT DOSE PEMBROLIZUMAB Q21D      05/19/2020 Cancer Staging   Staging form: Lung, AJCC 8th Edition - Clinical: Stage IVB (cT3, cN0, cM1c) - Signed by Si Gaul, MD on  05/19/2020   11/16/2022 -  Chemotherapy   Patient is on Treatment Plan : LUNG NSCLC Pembrolizumab (200) q21d      CNS Oncologic History 11/14/19: Completes single frx SRS to 2.4cm cerebellar metastasis 03/05/20: SRS to 7 targets [name]) 06/19/20: SRS to 6 additional targets [name])    Interval History:  Joseph Atkins presents today given clinical concerns.  He describes clear worsening of right leg weakness.  He is now needing to use a walker.  Also having more headaches and impaired memory.  He does complain of ongoing fatigue and low energy, not worse from prior. Has restarted Keytruda for disease recurrence with Dr. Arbutus Ped.  H+P (01/24/20) Patient presents today to discuss recent recurrence of neurologic symptoms following radiosurgery in July 2021.  He describes poor balance, wide based walking, needing to hold on to family or objects to walk safely.  He also describes nausea and dizzy-headed feeling at times.  Overall he is functioning in an impaired and sluggish way.  Symptoms have become noticeable since decreasing decadron to less than 4mg  daily; currently he is dosing 2mg  daily.  He felt "quite good" immediately after radiation and during higher dose steroid therapy.  No complications from decadron that he can report.  Recently completed chemoradioatherapy induction for lung adenocarcinoma with Dr. Arbutus Ped.  Medications: Current Outpatient Medications on File Prior to Visit  Medication Sig Dispense Refill   albuterol (PROVENTIL HFA;VENTOLIN HFA) 108 (90 Base) MCG/ACT inhaler Inhale 2 puffs into the lungs every 6 (six) hours as needed for wheezing or shortness of breath. 1 Inhaler 0  amLODipine (NORVASC) 5 MG tablet TAKE 1 TABLET (5 MG TOTAL) BY MOUTH DAILY. 90 tablet 1   ANORO ELLIPTA 62.5-25 MCG/INH AEPB 1 puff daily.     augmented betamethasone dipropionate (DIPROLENE-AF) 0.05 % cream Apply topically 2 (two) times daily.     [facility] ASPIRIN LOW DOSE 81 MG tablet TAKE 1 TABLET (81 MG  TOTAL) BY MOUTH DAILY. SWALLOW WHOLE. 90 tablet 3   cyclobenzaprine (FLEXERIL) 10 MG tablet Take 10 mg by mouth 2 (two) times daily as needed for muscle spasms.     docusate sodium (COLACE) 100 MG capsule Take 100 mg by mouth daily.     DULoxetine (CYMBALTA) 20 MG capsule Take 1 capsule (20 mg total) by mouth 2 (two) times daily. 60 capsule 5   fluticasone furoate-vilanterol (BREO ELLIPTA) 100-25 MCG/INH AEPB Inhale 1 puff into the lungs daily. 30 each 3   hydrOXYzine (ATARAX) 25 MG tablet Take 25 mg by mouth at bedtime.     levothyroxine (SYNTHROID) 100 MCG tablet TAKE 1 TABLET BY MOUTH EVERY DAY 90 tablet 1   lidocaine-prilocaine (EMLA) cream Apply 1 application topically as needed. 30 g 0   omeprazole (PRILOSEC) 40 MG capsule Take 1 capsule (40 mg total) by mouth daily. 90 capsule 4   ondansetron (ZOFRAN) 4 MG tablet Take 1 tablet (4 mg total) by mouth every 8 (eight) hours as needed. 40 tablet 2   oxyCODONE-acetaminophen (PERCOCET/ROXICET) 5-325 MG tablet Take 1 tablet by mouth every 4 (four) hours as needed for severe pain. 30 tablet 0   pembrolizumab (KEYTRUDA) 100 MG/4ML SOLN See admin instructions.     polyethylene glycol powder (MIRALAX) powder Take 17 g by mouth daily. 255 g 11   prochlorperazine (COMPAZINE) 10 MG tablet Take 1 tablet (10 mg total) by mouth every 6 (six) hours as needed. 30 tablet 2   temazepam (RESTORIL) 30 MG capsule Take 1 capsule (30 mg total) by mouth at bedtime as needed for sleep. 30 capsule 0   triamcinolone cream (KENALOG) 0.1 % APPLY TOPICALLY 2 TIMES DAILY AS NEEDED. 454 g 0   TRULANCE 3 MG TABS Take 1 tablet by mouth daily.     No current facility-administered medications on file prior to visit.    Allergies:  Allergies  Allergen Reactions   Penicillins Other (See Comments)    Childhood allergy.  Has patient had a PCN reaction causing immediate rash, facial/tongue/throat swelling, SOB or lightheadedness with hypotension: unknown Has patient had a PCN  reaction causing severe rash involving mucus membranes or skin necrosis: unknown Has patient had a PCN reaction that required hospitalization: unknown Has patient had a PCN reaction occurring within the last 10 years: no If all of the above answers are "NO", then may proceed with Cephalosporin use.    Past Medical History:  Past Medical History:  Diagnosis Date   Cancer (HCC)    lung cancer   Diverticulosis    GERD (gastroesophageal reflux disease)    Hx of small bowel obstruction    Hyperlipidemia    Hypertension    Hypothyroidism    IBS (irritable bowel syndrome)    Substance abuse (HCC)    Alcoholic, Drug addition   Thyroid disease    Past Surgical History:  Past Surgical History:  Procedure Laterality Date   arm surgery Right    BRONCHIAL BRUSHINGS  10/24/2019   Procedure: BRONCHIAL BRUSHINGS;  Surgeon: Leslye Peer, MD;  Location: [facility] ENDOSCOPY;  Service: Cardiopulmonary;;  right lower lobe  BRONCHIAL BRUSHINGS  11/05/2019   Procedure: BRONCHIAL BRUSHINGS;  Surgeon: Leslye Peer, MD;  Location: [facility] ENDOSCOPY;  Service: Pulmonary;;   BRONCHIAL NEEDLE ASPIRATION BIOPSY  10/24/2019   Procedure: BRONCHIAL NEEDLE ASPIRATION BIOPSIES;  Surgeon: Leslye Peer, MD;  Location: [facility] ENDOSCOPY;  Service: Cardiopulmonary;;   BRONCHIAL NEEDLE ASPIRATION BIOPSY  11/05/2019   Procedure: BRONCHIAL NEEDLE ASPIRATION BIOPSIES;  Surgeon: Leslye Peer, MD;  Location: [facility] ENDOSCOPY;  Service: Pulmonary;;   ENDOBRONCHIAL ULTRASOUND N/A 10/24/2019   Procedure: ENDOBRONCHIAL ULTRASOUND;  Surgeon: Leslye Peer, MD;  Location: [facility] ENDOSCOPY;  Service: Cardiopulmonary;  Laterality: N/A;   FINGER SURGERY Right    Middle   IR IMAGING GUIDED PORT INSERTION  11/19/2019   VIDEO BRONCHOSCOPY N/A 10/24/2019   Procedure: VIDEO BRONCHOSCOPY WITHOUT FLUORO;  Surgeon: Leslye Peer, MD;  Location: [facility] ENDOSCOPY;  Service: Cardiopulmonary;  Laterality: N/A;   VIDEO BRONCHOSCOPY WITH ENDOBRONCHIAL NAVIGATION  N/A 11/05/2019   Procedure: VIDEO BRONCHOSCOPY WITH ENDOBRONCHIAL NAVIGATION;  Surgeon: Leslye Peer, MD;  Location: [facility] ENDOSCOPY;  Service: Pulmonary;  Laterality: N/A;   Social History:  Social History   Socioeconomic History   Marital status: Married    Spouse name: Not on file   Number of children: 2   Years of education: Not on file   Highest education level: Not on file  Occupational History   Occupation: supervisor    Employer: [employer]  Tobacco Use   Smoking status: Former    Current packs/day: 1.50    Types: Cigarettes   Smokeless tobacco: Former    Types: Associate Professor status: Never Used  Substance and Sexual Activity   Alcohol use: No    Comment: Alcoholic clean for 6 years   Drug use: No    Comment: Recovering Drug Addict-clean for 6 years   Sexual activity: Not on file  Other Topics Concern   Not on file  Social History Narrative   Not on file   Social Determinants of Health   Financial Resource Strain: Medium Risk (10/31/2019)   Overall Financial Resource Strain (CARDIA)    Difficulty of Paying Living Expenses: Somewhat hard  Food Insecurity: No Food Insecurity (10/31/2019)   Hunger Vital Sign    Worried About Running Out of Food in the Last Year: Never true    Ran Out of Food in the Last Year: Never true  Transportation Needs: No Transportation Needs (10/31/2019)   PRAPARE - Administrator, Civil Service (Medical): No    Lack of Transportation (Non-Medical): No  Physical Activity: Insufficiently Active (10/31/2019)   Exercise Vital Sign    Days of Exercise per Week: 5 days    Minutes of Exercise per Session: 20 min  Stress: Stress Concern Present (10/31/2019)   [employer] of Occupational Health - Occupational Stress Questionnaire    Feeling of Stress : Very much  Social Connections: Moderately Isolated (10/31/2019)   Social Connection and Isolation Panel [NHANES]    Frequency of Communication with Friends and Family:  More than three times a week    Frequency of Social Gatherings with Friends and Family: More than three times a week    Attends Religious Services: Never    Database administrator or Organizations: No    Attends Banker Meetings: Never    Marital Status: Married  Catering manager Violence: Not At Risk (10/31/2019)   Humiliation, Afraid, Rape, and Kick questionnaire    Fear of Current  or Ex-Partner: No    Emotionally Abused: No    Physically Abused: No    Sexually Abused: No   Family History:  Family History  Problem Relation Age of Onset   Epilepsy Mother    Heart disease Mother    Heart disease Brother    Lung cancer Paternal Uncle     Review of Systems: Constitutional: Doesn't report fevers, chills or abnormal weight loss Eyes: Doesn't report blurriness of vision Ears, nose, mouth, throat, and face: Doesn't report sore throat Respiratory: Doesn't report cough, dyspnea or wheezes Cardiovascular: Doesn't report palpitation, chest discomfort  Gastrointestinal:  Doesn't report nausea, constipation, diarrhea GU: Doesn't report incontinence Skin: Doesn't report skin rashes Neurological: Per HPI Musculoskeletal: Doesn't report joint pain Behavioral/Psych: Doesn't report anxiety  Physical Exam: Wt Readings from Last 3 Encounters:  11/16/22 187 lb (84.8 kg)  11/07/22 186 lb 1 oz (84.4 kg)  10/17/22 187 lb 14.4 oz (85.2 kg)   Temp Readings from Last 3 Encounters:  11/16/22 98.1 F (36.7 C) (Oral)  11/07/22 98.1 F (36.7 C) (Oral)  10/17/22 97.6 F (36.4 C) (Oral)   BP Readings from Last 3 Encounters:  11/16/22 (!) 128/94  11/07/22 (!) 142/83  10/17/22 135/81   Pulse Readings from Last 3 Encounters:  11/16/22 100  11/07/22 (!) 107  10/17/22 100     KPS: 70. General: Alert, cooperative, pleasant, in no acute distress Head: Normal EENT: No conjunctival injection or scleral icterus.  Lungs: Resp effort normal Cardiac: Regular rate Abdomen:  Non-distended abdomen Skin: No rashes cyanosis or petechiae. Extremities: No clubbing or edema  Neurologic Exam: Mental Status: Awake, alert, attentive to examiner. Oriented to self and environment. Language is fluent with intact comprehension.  Cranial Nerves: Visual acuity is grossly normal. Visual fields are full. Extra-ocular movements intact. No ptosis. Face is symmetric Motor: Tone and bulk are normal. Power is 4/5 in right leg. Reflexes are symmetric, no pathologic reflexes present.  Dysmetria L>R Sensory: Intact to light touch Gait: Dystaxic, wide based   Labs: I have reviewed the data as listed    Component Value Date/Time   NA 135 11/16/2022 1311   NA 141 03/08/2017 1707   K 3.7 11/16/2022 1311   CL 102 11/16/2022 1311   CO2 25 11/16/2022 1311   GLUCOSE 142 (H) 11/16/2022 1311   BUN 9 11/16/2022 1311   BUN 12 03/08/2017 1707   CREATININE 0.91 11/16/2022 1311   CALCIUM 8.9 11/16/2022 1311   PROT 6.7 11/16/2022 1311   PROT 7.1 03/08/2017 1707   ALBUMIN 3.9 11/16/2022 1311   ALBUMIN 4.5 03/08/2017 1707   AST 21 11/16/2022 1311   ALT 25 11/16/2022 1311   ALKPHOS 106 11/16/2022 1311   BILITOT 0.4 11/16/2022 1311   GFRNONAA >60 11/16/2022 1311   GFRAA >60 01/06/2020 0837   Lab Results  Component Value Date   WBC 8.6 11/16/2022   NEUTROABS 6.2 11/16/2022   HGB 12.2 (L) 11/16/2022   HCT 37.4 (L) 11/16/2022   MCV 88.8 11/16/2022   PLT 337 11/16/2022    Assessment/Plan Malignant neoplasm metastatic to brain [facility]) [C79.31]  HUSAM TZINTZUN is clinically progressive today, with worsening motor function and dystaxia.    This could represent progression of disease in the CNS, will recommend obtaining ASAP MRI study.    Also recommended starting low dose of decadron, 2mg  daily, for focal symptoms.  He will continue on Keytruda monotherapy with Dr. Arbutus Ped.  Otherwise we ask that [name] return  to clinic following brain MRI, or sooner as needed.  We  appreciate the opportunity to participate in the care of SHUAYB BIRSCHBACH.   All questions were answered. The patient knows to call the clinic with any problems, questions or concerns. No barriers to learning were detected.  The total time spent in the encounter was 30 minutes and more than 50% was on counseling and review of test results   Henreitta Leber, MD Medical Director of Neuro-Oncology [facility] at [facility] 12/01/22 12:24 PM

## 2022-12-01 NOTE — Progress Notes (Signed)
Updated order to location of [facility] since [facility] can not schedule MRI Stat.  Next available with them is 01/12/2023.

## 2022-12-05 NOTE — Progress Notes (Addendum)
[name] OFFICE PROGRESS NOTE  [name], NP [address] [name] [address] [place] [address]  DIAGNOSIS: Stage IV (T3, N0, M1C) non-small cell lung cancer, adenocarcinoma.  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 He was diagnosed in July 2021.   Molecular Biomarkers:  MSI-High DETECTED Pembrolizumab Atezolizumab, Avelumab, Cemiplimab, Dostarlimab, Durvalumab, Ipilimumab, Nivolumab   STK11Splice Site SNV 1.9% Everolimus, Temsirolimus Yes   KRASG12D 1.7% Binimetinib Yes   [id number] 0.4%   Niraparib, Olaparib, Rucaparib, Talazoparib, Tazemetostat Yes  PRIOR THERAPY: 1) SRS to the solitary brain metastasis under the care of Dr. [name]. Last treatment 11/14/19. 2) Weekly concurrent chemoradiation with carboplatin for an AUC of 2, paclitaxel 45 mg/m2.  First dose expected on 11/25/2019. Status post 7 cycles, last dose was given 01/06/2020 with partial response.  3)  Immunotherapy with Keytruda 200 mg IV every 3 weeks.  First dose February 10, 2020 for a patient with MSI high.  Status post 35  cycles. 4) Avastin 15 mg/KG every 3 weeks.  First dose today for the vasogenic edema of the brain.S/P 7 cycles. 5) The patient had evidence for local disease recurrence in July 2024.  CURRENT THERAPY: Resuming his treatment with Keytruda 200 Mg IV every 3 weeks. First dose 11/16/2022. Status post 1 cycle.   INTERVAL HISTORY: Joseph Atkins 62 y.o. male returns to the clinic today for a follow up visit accompanied by his wife. The patient recently had evidence of disease progression with increased activity of the central aspect of the right lower lobe concerning for local disease recurrence. Therefore, he resumed immunotherapy with [name]. He tolerates this well without any adverse side effects except in the past he has had pruritic nodular lesions from [facility] for which he would use  kenalog cream and zyrtec. Of note, the patient recently saw Dr. Barbaraann Cao on 8/8 and had worsening motor function and dystaxia. He started him on decadron. The patient is reluctant to take additional mediations but he did acknowledge that this helped his dystaxia.  Dr. Barbaraann Cao arranged for repeat brain MRI which was performed yesterday.  The patient is wondering what the next steps are for this.     Otherwise he is feeling fine today.  He denies any fever, chills, night sweats, or unexplained weight loss.  He reports dyspnea on exertion. He believes this may be a little worse. He denies any significant cough. He only uses rescue inhalers "now and then". He denies any hemoptysis or chest pain.  Denies any nausea, vomiting, diarrhea, or constipation.  His headaches have improved since starting decadron. He is here today for evaluation and repeat blood work before undergoing cycle #2.      MEDICAL HISTORY: Past Medical History:  Diagnosis Date   Cancer (HCC)    lung cancer   Diverticulosis    GERD (gastroesophageal reflux disease)    Hx of small bowel obstruction    Hyperlipidemia    Hypertension    Hypothyroidism    IBS (irritable bowel syndrome)    Substance abuse (HCC)    Alcoholic, Drug addition   Thyroid disease     ALLERGIES:  is allergic to penicillins.  MEDICATIONS:  Current Outpatient Medications  Medication Sig Dispense Refill   albuterol (PROVENTIL HFA;VENTOLIN HFA) 108 (90 Base) MCG/ACT inhaler Inhale 2 puffs into the lungs every 6 (six) hours as needed for wheezing or shortness of breath. 1 Inhaler 0  amLODipine (NORVASC) 5 MG tablet TAKE 1 TABLET (5 MG TOTAL) BY MOUTH DAILY. 90 tablet 1   ANORO ELLIPTA 62.5-25 MCG/INH AEPB 1 puff daily.     augmented betamethasone dipropionate (DIPROLENE-AF) 0.05 % cream Apply topically 2 (two) times daily.     [facility] ASPIRIN LOW DOSE 81 MG tablet TAKE 1 TABLET (81 MG TOTAL) BY MOUTH DAILY. SWALLOW WHOLE. 90 tablet 3   cyclobenzaprine  (FLEXERIL) 10 MG tablet Take 10 mg by mouth 2 (two) times daily as needed for muscle spasms.     dexamethasone (DECADRON) 2 MG tablet Take 1 tablet (2 mg total) by mouth daily. 30 tablet 0   docusate sodium (COLACE) 100 MG capsule Take 100 mg by mouth daily.     DULoxetine (CYMBALTA) 20 MG capsule Take 1 capsule (20 mg total) by mouth 2 (two) times daily. 60 capsule 5   fluticasone furoate-vilanterol (BREO ELLIPTA) 100-25 MCG/INH AEPB Inhale 1 puff into the lungs daily. 30 each 3   hydrOXYzine (ATARAX) 25 MG tablet Take 25 mg by mouth at bedtime.     levothyroxine (SYNTHROID) 100 MCG tablet TAKE 1 TABLET BY MOUTH EVERY DAY 90 tablet 1   lidocaine-prilocaine (EMLA) cream Apply 1 application topically as needed. 30 g 0   omeprazole (PRILOSEC) 40 MG capsule Take 1 capsule (40 mg total) by mouth daily. 90 capsule 4   ondansetron (ZOFRAN) 4 MG tablet Take 1 tablet (4 mg total) by mouth every 8 (eight) hours as needed. 40 tablet 2   oxyCODONE-acetaminophen (PERCOCET/ROXICET) 5-325 MG tablet Take 1 tablet by mouth every 4 (four) hours as needed for severe pain. 30 tablet 0   pembrolizumab (KEYTRUDA) 100 MG/4ML SOLN See admin instructions.     polyethylene glycol powder (MIRALAX) powder Take 17 g by mouth daily. 255 g 11   prochlorperazine (COMPAZINE) 10 MG tablet Take 1 tablet (10 mg total) by mouth every 6 (six) hours as needed. 30 tablet 2   temazepam (RESTORIL) 30 MG capsule Take 1 capsule (30 mg total) by mouth at bedtime as needed for sleep. 30 capsule 0   triamcinolone cream (KENALOG) 0.1 % APPLY TOPICALLY 2 TIMES DAILY AS NEEDED. 454 g 0   TRULANCE 3 MG TABS Take 1 tablet by mouth daily.     No current facility-administered medications for this visit.   Facility-Administered Medications Ordered in Other Visits  Medication Dose Route Frequency Provider Last Rate Last Admin   pembrolizumab (KEYTRUDA) 200 mg in sodium chloride 0.9 % 50 mL chemo infusion  200 mg Intravenous Once Si Gaul,  MD        SURGICAL HISTORY:  Past Surgical History:  Procedure Laterality Date   arm surgery Right    BRONCHIAL BRUSHINGS  10/24/2019   Procedure: BRONCHIAL BRUSHINGS;  Surgeon: Leslye Peer, MD;  Location: [facility] ENDOSCOPY;  Service: Cardiopulmonary;;  right lower lobe    BRONCHIAL BRUSHINGS  11/05/2019   Procedure: BRONCHIAL BRUSHINGS;  Surgeon: Leslye Peer, MD;  Location[facility] ENDOSCOPY;  Service: Pulmonary;;   BRONCHIAL NEEDLE ASPIRATION BIOPSY  10/24/2019   Procedure: BRONCHIAL NEEDLE ASPIRATION BIOPSIES;  Surgeon: Leslye Peer, MD;  Location: [facility] ENDOSCOPY;  Service: Cardiopulmonary;;   BRONCHIAL NEEDLE ASPIRATION BIOPSY  11/05/2019   Procedure: BRONCHIAL NEEDLE ASPIRATION BIOPSIES;  Surgeon: Leslye Peer, MD;  Location: [facility] ENDOSCOPY;  Service: Pulmonary;;   ENDOBRONCHIAL ULTRASOUND N/A 10/24/2019   Procedure: ENDOBRONCHIAL ULTRASOUND;  Surgeon: Leslye Peer, MD;  Location: [facility] ENDOSCOPY;  Service: Cardiopulmonary;  Laterality: N/A;  FINGER SURGERY Right    Middle   IR IMAGING GUIDED PORT INSERTION  11/19/2019   VIDEO BRONCHOSCOPY N/A 10/24/2019   Procedure: VIDEO BRONCHOSCOPY WITHOUT FLUORO;  Surgeon: Leslye Peer, MD;  Location: [facility] ENDOSCOPY;  Service: Cardiopulmonary;  Laterality: N/A;   VIDEO BRONCHOSCOPY WITH ENDOBRONCHIAL NAVIGATION N/A 11/05/2019   Procedure: VIDEO BRONCHOSCOPY WITH ENDOBRONCHIAL NAVIGATION;  Surgeon: Leslye Peer, MD;  Location: [facility] ENDOSCOPY;  Service: Pulmonary;  Laterality: N/A;    REVIEW OF SYSTEMS:   Review of Systems  Constitutional: Negative for appetite change, chills, fatigue, fever and unexpected weight change.  HENT: Negative for mouth sores, nosebleeds, sore throat and trouble swallowing.   Eyes: Negative for eye problems and icterus.  Respiratory: Positive for shortness of breath with exertion. Negative for cough, hemoptysis, and wheezing.   Cardiovascular: Negative for chest pain and leg swelling.  Gastrointestinal: Negative for  abdominal pain, constipation, diarrhea, nausea and vomiting.  Genitourinary: Negative for bladder incontinence, difficulty urinating, dysuria, frequency and hematuria.   Musculoskeletal: Negative for back pain, gait problem, neck pain and neck stiffness.  Skin: Positive for occasional rashes and itching.  Neurological: Positive for improving headaches. Positive for balance changes and gait disturbances. Negative for extremity weakness, light-headedness and seizures.  Hematological: Negative for adenopathy. Does not bruise/bleed easily.  Psychiatric/Behavioral: Negative for confusion, depression and sleep disturbance. The patient is not nervous/anxious.     PHYSICAL EXAMINATION:  Blood pressure 120/75, pulse 88, temperature (!) 97.3 F (36.3 C), temperature source Temporal, resp. rate 16, weight 182 lb 9.6 oz (82.8 kg), SpO2 100%.  ECOG PERFORMANCE STATUS: 1  Physical Exam  Constitutional: Oriented to person, place, and time and well-developed, well-nourished, and in no distress.  HENT:  Head: Normocephalic and atraumatic.  Mouth/Throat: Oropharynx is clear and moist. No oropharyngeal exudate.  Eyes: Conjunctivae are normal. Right eye exhibits no discharge. Left eye exhibits no discharge. No scleral icterus.  Neck: Normal range of motion. Neck supple.  Cardiovascular: Normal rate, regular rhythm, normal heart sounds and intact distal pulses.   Pulmonary/Chest: Effort normal and breath sounds normal. No respiratory distress. No wheezes. No rales.  Abdominal: Soft. Bowel sounds are normal. Exhibits no distension and no mass. There is no tenderness.  Musculoskeletal: Normal range of motion. Exhibits no edema.  Lymphadenopathy:    No cervical adenopathy.  Neurological: Alert and oriented to person, place, and time. Exhibits normal muscle tone. Gait normal. Coordination normal.  Skin: Skin is warm and dry. Not diaphoretic. No erythema. No pallor.  Psychiatric: Mood, memory and judgment  normal.  Vitals reviewed.  LABORATORY DATA: Lab Results  Component Value Date   WBC 9.5 12/08/2022   HGB 10.9 (L) 12/08/2022   HCT 34.0 (L) 12/08/2022   MCV 88.1 12/08/2022   PLT 384 12/08/2022      Chemistry      Component Value Date/Time   NA 135 12/08/2022 1346   NA 141 03/08/2017 1707   K 3.7 12/08/2022 1346   CL 102 12/08/2022 1346   CO2 26 12/08/2022 1346   BUN 6 12/08/2022 1346   BUN 12 03/08/2017 1707   CREATININE 0.91 12/08/2022 1346      Component Value Date/Time   CALCIUM 7.9 (L) 12/08/2022 1346   ALKPHOS 123 12/08/2022 1346   AST 14 (L) 12/08/2022 1346   ALT 15 12/08/2022 1346   BILITOT 0.3 12/08/2022 1346       RADIOGRAPHIC STUDIES:  MR Brain W Wo Contrast  Result Date: 12/07/2022 CLINICAL DATA:  62 year old male with metastatic lung cancer status post SRS in 2021 and 2022. PET-CT last month raising the possibility of recurrent right lower lung tumor. Restaging. EXAM: MRI HEAD WITHOUT AND WITH CONTRAST TECHNIQUE: Multiplanar, multiecho pulse sequences of the brain and surrounding structures were obtained without and with intravenous contrast. CONTRAST:  8mL GADAVIST GADOBUTROL 1 MMOL/ML IV SOLN COMPARISON:  Brain MRI 07/18/2022 and 01/08/2022. FINDINGS: Brain: Persistent enhancing inferior left cerebellar mass now measures up to 29 mm long axis, with ongoing stellate margins. Compared to last year, the left lateral margin of the mass does appear mildly contracted. Chronic hemosiderin. Surrounding cerebellar T2 and FLAIR hyperintensity has slightly regressed from last year. No regional mass effect. However, linear enhancement along the medial inferior margin of the mass is more conspicuous on series 1100, image 100, such that leptomeningeal involvement there is difficult to exclude. However, no disseminated leptomeningeal enhancement or dural thickening is identified today. Pre-existing medial left occipital lobe metastasis on series 1100, image 148 has not  significantly changed from last year. But on that image a new contralateral punctate right occipital metastasis is demonstrated, and a second new nearby right occipital punctate metastasis is on image 151. 3 mm pre-existing metastasis in the superior right occipital lobe on series 1100, image 159 is more apparent from September of last year, although only slightly increased since March. Contralateral left superior cerebellar metastasis is stable on image 164. Small new 2-3 mm posterior right temporal lobe enhancing metastasis on image 163. Stable right posterior frontal lobe metastasis on series 1100, image 220. Stable left superior perirolandic metastasis on image 229. No edema or mass effect associated with the new enhancing lesions. Mild pre-existing T2/FLAIR hyperintensity at the other sites is stable. No ependymal enhancement. Overall brain volume remains normal. No midline shift or intracranial mass effect. No superimposed restricted diffusion to suggest acute infarction. No ventriculomegaly, extra-axial collection or acute intracranial hemorrhage. Cervicomedullary junction and pituitary are within normal limits. Underlying nonspecific moderate for age scattered nonenhancing white matter FLAIR hyperintensity is stable. No other chronic cerebral blood products. There are small chronic cystic areas of encephalomalacia in the right thalamus and pons. Vascular: Major intracranial vascular flow voids are stable. Skull and upper cervical spine: Visualized bone marrow signal is within normal limits. Negative visible cervical spine and spinal cord. Sinuses/Orbits: Negative. Other: Mastoids remain clear. Grossly normal visible internal auditory structures. IMPRESSION: 1. Progression of disease: - three new punctate/2 mm enhancing metastases. Two of these are in the medial right occipital lobe. See series 1100, images 148, 151, 163. - stellate chronic left inferior cerebellar metastasis stable (2.9 cm) except for  increased conspicuity of curvilinear enhancement along its inferomedial margin. Difficult to exclude leptomeningeal involvement there. But no other leptomeningeal disease. - four additional treated metastases are stable, but a small chronic metastasis also in the right occipital lobe on series 1100 image 159 has increased from 1 year ago, slightly larger since March. 2. No acute cerebral edema or intracranial mass effect. Electronically Signed   By: Odessa Fleming M.D.   On: 12/07/2022 12:55     ASSESSMENT/PLAN:  This is a very pleasant 62 year old Caucasian male diagnosed with stage IV non-small cell lung cancer, adenocarcinoma.  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 He was diagnosed in July 2021. His molecular studies by [facility] show he has MSI high which is a good marker for response to immunotherapy which will be important  for future treatment.    The patient will complete SRS to the solidary brain metastasis under the care of Dr. [name] later today on 11/14/19.    He then underwent a course of weekly concurrent chemoradiation with carboplatin for an AUC of 2 and paclitaxel 45 mg per metered squared.  He tolerated this treatment well except for fatigue and mild odynophagia.     The patient showed evidence of disease progression with an increase in volume of the right infrahilar mass.  Since the patient has MSI high, the patient then underwent treatment with single agent immunotherapy with Keytruda 200 mg IV every 3 weeks.   The patient is status post 35 cycles and completed his 2 years of treatment on 03/23/22.  He also received 7 cycles of Avastin for vasogenic edema in the brain.  Avastin has been on hold unless needed again in the future.    He had evidence of progression on his surveillance imaging from July 2024. Therefore, he resumed immunotherapy with Keytruda. He is status post 1 cycle.   Labs were reviewed.  Recommend that he proceed  with cycle #2 today as scheduled.   We will see him back for a follow up in 3 weeks for evaluation and repeat blood work before undergoing cycle #3.   I reached out to Dr. Barbaraann Cao on behalf of the patient who is arranging for the patient's most recent brain MRI to be discussed at tumor board next week and they will determine if the appropriate follow-up with radiation oncology or to see Dr. Barbaraann Cao.  For now the patient will continue on 2 mg Decadron.  The patient did mention this has improved his balance and headaches.  The patient has Kenalog cream and will take Zyrtec if he has any trouble with itching with his immunotherapy as he has in the past.  The patient was advised to call immediately if she has any concerning symptoms in the interval. The patient voices understanding of current disease status and treatment options and is in agreement with the current care plan. All questions were answered. The patient knows to call the clinic with any problems, questions or concerns. We can certainly see the patient much sooner if necessary          No orders of the defined types were placed in this encounter.    The total time spent in the appointment was 20-29 minutes  Nihira Puello L Khandi Kernes, PA-C 12/08/22

## 2022-12-07 ENCOUNTER — Ambulatory Visit (HOSPITAL_COMMUNITY)
Admission: RE | Admit: 2022-12-07 | Discharge: 2022-12-07 | Disposition: A | Discharge status: Home / Self Care | Payer: Managed Care, Other (non HMO) | Source: Ambulatory Visit | Attending: Internal Medicine | Admitting: Internal Medicine

## 2022-12-07 ENCOUNTER — Ambulatory Visit (HOSPITAL_COMMUNITY): Payer: Managed Care, Other (non HMO)

## 2022-12-07 DIAGNOSIS — C7931 Secondary malignant neoplasm of brain: Secondary | ICD-10-CM | POA: Insufficient documentation

## 2022-12-07 MED ORDER — CHLORHEXIDINE GLUCONATE CLOTH 2 % EX PADS: 6.0000 | MEDICATED_PAD | Freq: Every day | CUTANEOUS | Status: DC

## 2022-12-07 MED ORDER — HEPARIN SOD (PORK) LOCK FLUSH 100 UNIT/ML IV SOLN
500.0000 [IU] | INTRAVENOUS | Status: AC | PRN
  Administered 2022-12-07: 500 [IU]

## 2022-12-07 MED ORDER — GADOBUTROL 1 MMOL/ML IV SOLN
8.0000 mL | Freq: Once | INTRAVENOUS | Status: AC | PRN
  Administered 2022-12-07: 8 mL via INTRAVENOUS

## 2022-12-07 MED ORDER — SODIUM CHLORIDE 0.9% FLUSH: 10.0000 mL | INTRAVENOUS | Status: DC | PRN

## 2022-12-07 MED ORDER — SODIUM CHLORIDE 0.9% FLUSH: 10.0000 mL | Freq: Two times a day (BID) | INTRAVENOUS | Status: DC

## 2022-12-08 ENCOUNTER — Inpatient Hospital Stay: Payer: Managed Care, Other (non HMO)

## 2022-12-08 ENCOUNTER — Inpatient Hospital Stay: Payer: Managed Care, Other (non HMO) | Admitting: Physician Assistant

## 2022-12-08 VITALS — BP 120/75 | HR 88 | Temp 97.3°F | Resp 16 | Wt 182.6 lb

## 2022-12-08 DIAGNOSIS — C7931 Secondary malignant neoplasm of brain: Secondary | ICD-10-CM | POA: Diagnosis not present

## 2022-12-08 DIAGNOSIS — Z95828 Presence of other vascular implants and grafts: Secondary | ICD-10-CM

## 2022-12-08 DIAGNOSIS — Z5112 Encounter for antineoplastic immunotherapy: Secondary | ICD-10-CM | POA: Diagnosis not present

## 2022-12-08 DIAGNOSIS — C3491 Malignant neoplasm of unspecified part of right bronchus or lung: Secondary | ICD-10-CM

## 2022-12-08 LAB — CBC WITH DIFFERENTIAL (CANCER CENTER ONLY)
Abs Immature Granulocytes: 0.05 10*3/uL (ref 0.00–0.07)
Basophils Absolute: 0 10*3/uL (ref 0.0–0.1)
Basophils Relative: 0 %
Eosinophils Absolute: 0 10*3/uL (ref 0.0–0.5)
Eosinophils Relative: 0 %
HCT: 34 % — ABNORMAL LOW (ref 39.0–52.0)
Hemoglobin: 10.9 g/dL — ABNORMAL LOW (ref 13.0–17.0)
Immature Granulocytes: 1 %
Lymphocytes Relative: 11 %
Lymphs Abs: 1.1 10*3/uL (ref 0.7–4.0)
MCH: 28.2 pg (ref 26.0–34.0)
MCHC: 32.1 g/dL (ref 30.0–36.0)
MCV: 88.1 fL (ref 80.0–100.0)
Monocytes Absolute: 0.5 10*3/uL (ref 0.1–1.0)
Monocytes Relative: 5 %
Neutro Abs: 7.8 10*3/uL — ABNORMAL HIGH (ref 1.7–7.7)
Neutrophils Relative %: 83 %
Platelet Count: 384 10*3/uL (ref 150–400)
RBC: 3.86 MIL/uL — ABNORMAL LOW (ref 4.22–5.81)
RDW: 13.7 % (ref 11.5–15.5)
WBC Count: 9.5 10*3/uL (ref 4.0–10.5)
nRBC: 0 % (ref 0.0–0.2)

## 2022-12-08 LAB — CMP (CANCER CENTER ONLY)
ALT: 15 U/L (ref 0–44)
AST: 14 U/L — ABNORMAL LOW (ref 15–41)
Albumin: 3.7 g/dL (ref 3.5–5.0)
Alkaline Phosphatase: 123 U/L (ref 38–126)
Anion gap: 7 (ref 5–15)
BUN: 6 mg/dL (ref 6–20)
CO2: 26 mmol/L (ref 22–32)
Calcium: 7.9 mg/dL — ABNORMAL LOW (ref 8.9–10.3)
Chloride: 102 mmol/L (ref 98–111)
Creatinine: 0.91 mg/dL (ref 0.61–1.24)
GFR, Estimated: >60 mL/min (ref >60)
Glucose, Bld: 166 mg/dL — ABNORMAL HIGH (ref 70–99)
Potassium: 3.7 mmol/L (ref 3.5–5.1)
Sodium: 135 mmol/L (ref 135–145)
Total Bilirubin: 0.3 mg/dL (ref 0.3–1.2)
Total Protein: 6.6 g/dL (ref 6.5–8.1)

## 2022-12-08 MED ORDER — SODIUM CHLORIDE 0.9% FLUSH
10.0000 mL | INTRAVENOUS | Status: DC | PRN
  Administered 2022-12-08: 10 mL

## 2022-12-08 MED ORDER — HEPARIN SOD (PORK) LOCK FLUSH 100 UNIT/ML IV SOLN
500.0000 [IU] | Freq: Once | INTRAVENOUS | Status: AC | PRN
  Administered 2022-12-08: 500 [IU]

## 2022-12-08 MED ORDER — SODIUM CHLORIDE 0.9 % IV SOLN: Freq: Once | INTRAVENOUS | Status: AC

## 2022-12-08 MED ORDER — SODIUM CHLORIDE 0.9 % IV SOLN
200.0000 mg | Freq: Once | INTRAVENOUS | Status: AC
  Administered 2022-12-08: 200 mg via INTRAVENOUS
  Filled 2022-12-08: qty 200

## 2022-12-08 NOTE — Patient Instructions (Signed)
Joseph Atkins  Discharge Instructions: Thank you for choosing [facility] to provide your oncology and hematology care.   If you have a lab appointment with the [facility], please go directly to the [facility] and check in at the registration area.   Wear comfortable clothing and clothing appropriate for easy access to any Portacath or PICC line.   We strive to give you quality time with your provider. You may need to reschedule your appointment if you arrive late (15 or more minutes).  Arriving late affects you and other patients whose appointments are after yours.  Also, if you miss three or more appointments without notifying the office, you may be dismissed from the clinic at the provider's discretion.      For prescription refill requests, have your pharmacy contact our office and allow 72 hours for refills to be completed.    Today you received the following chemotherapy and/or immunotherapy agents: Keytruda      To help prevent nausea and vomiting after your treatment, we encourage you to take your nausea medication as directed.  BELOW ARE SYMPTOMS THAT SHOULD BE REPORTED IMMEDIATELY: *FEVER GREATER THAN 100.4 F (38 C) OR HIGHER *CHILLS OR SWEATING *NAUSEA AND VOMITING THAT IS NOT CONTROLLED WITH YOUR NAUSEA MEDICATION *UNUSUAL SHORTNESS OF BREATH *UNUSUAL BRUISING OR BLEEDING *URINARY PROBLEMS (pain or burning when urinating, or frequent urination) *BOWEL PROBLEMS (unusual diarrhea, constipation, pain near the anus) TENDERNESS IN MOUTH AND THROAT WITH OR WITHOUT PRESENCE OF ULCERS (sore throat, sores in mouth, or a toothache) UNUSUAL RASH, SWELLING OR PAIN  UNUSUAL VAGINAL DISCHARGE OR ITCHING   Items with * indicate a potential emergency and should be followed up as soon as possible or go to the Emergency Department if any problems should occur.  Please show the CHEMOTHERAPY ALERT CARD or IMMUNOTHERAPY ALERT CARD at  check-in to the Emergency Department and triage nurse.  Should you have questions after your visit or need to cancel or reschedule your appointment, please contact [facility]  Dept: [phone]  and follow the prompts.  Office hours are 8:00 a.m. to 4:30 p.m. Monday - Friday. Please note that voicemails left after 4:00 p.m. may not be returned until the following business day.  We are closed weekends and major holidays. You have access to a nurse at all times for urgent questions. Please call the main number to the clinic Dept: [phone] and follow the prompts.   For any non-urgent questions, you may also contact your provider using MyChart. We now offer e-Visits for anyone 14 and older to request care online for non-urgent symptoms. For details visit [email or web address].   Also download the MyChart app! Go to the app store, search "MyChart", open the app, select [facility], and log in with your MyChart username and password.

## 2022-12-13 ENCOUNTER — Other Ambulatory Visit: Payer: Self-pay

## 2022-12-13 ENCOUNTER — Ambulatory Visit
Admission: RE | Admit: 2022-12-13 | Discharge: 2022-12-13 | Disposition: A | Discharge status: Home / Self Care | Payer: Managed Care, Other (non HMO) | Source: Ambulatory Visit | Attending: Radiation Oncology | Admitting: Radiation Oncology

## 2022-12-13 ENCOUNTER — Encounter: Payer: Self-pay | Admitting: Radiation Oncology

## 2022-12-13 VITALS — BP 122/74 | HR 86 | Temp 97.4°F | Resp 20 | Ht 68.0 in | Wt 184.2 lb

## 2022-12-13 DIAGNOSIS — Z79890 Hormone replacement therapy (postmenopausal): Secondary | ICD-10-CM | POA: Insufficient documentation

## 2022-12-13 DIAGNOSIS — I10 Essential (primary) hypertension: Secondary | ICD-10-CM | POA: Diagnosis not present

## 2022-12-13 DIAGNOSIS — E039 Hypothyroidism, unspecified: Secondary | ICD-10-CM | POA: Insufficient documentation

## 2022-12-13 DIAGNOSIS — Z87891 Personal history of nicotine dependence: Secondary | ICD-10-CM | POA: Insufficient documentation

## 2022-12-13 DIAGNOSIS — Z79899 Other long term (current) drug therapy: Secondary | ICD-10-CM | POA: Diagnosis not present

## 2022-12-13 DIAGNOSIS — K219 Gastro-esophageal reflux disease without esophagitis: Secondary | ICD-10-CM | POA: Diagnosis not present

## 2022-12-13 DIAGNOSIS — C3431 Malignant neoplasm of lower lobe, right bronchus or lung: Secondary | ICD-10-CM | POA: Diagnosis not present

## 2022-12-13 DIAGNOSIS — C7931 Secondary malignant neoplasm of brain: Secondary | ICD-10-CM | POA: Diagnosis present

## 2022-12-13 DIAGNOSIS — Z7982 Long term (current) use of aspirin: Secondary | ICD-10-CM | POA: Insufficient documentation

## 2022-12-13 DIAGNOSIS — E785 Hyperlipidemia, unspecified: Secondary | ICD-10-CM | POA: Insufficient documentation

## 2022-12-13 DIAGNOSIS — Z7952 Long term (current) use of systemic steroids: Secondary | ICD-10-CM | POA: Diagnosis not present

## 2022-12-13 DIAGNOSIS — Z7951 Long term (current) use of inhaled steroids: Secondary | ICD-10-CM | POA: Diagnosis not present

## 2022-12-13 DIAGNOSIS — R609 Edema, unspecified: Secondary | ICD-10-CM | POA: Insufficient documentation

## 2022-12-13 DIAGNOSIS — K589 Irritable bowel syndrome without diarrhea: Secondary | ICD-10-CM | POA: Insufficient documentation

## 2022-12-13 DIAGNOSIS — Z923 Personal history of irradiation: Secondary | ICD-10-CM | POA: Diagnosis not present

## 2022-12-13 DIAGNOSIS — C3491 Malignant neoplasm of unspecified part of right bronchus or lung: Secondary | ICD-10-CM

## 2022-12-13 DIAGNOSIS — J984 Other disorders of lung: Secondary | ICD-10-CM | POA: Diagnosis not present

## 2022-12-13 NOTE — Progress Notes (Signed)
Telephone nursing appointment for patient to review most recent scan results. I verified patient's identity x2 and began nursing interview.   Patient reports occasional depression, headaches 6/10, and fatigue. No other issues conveyed at this time.   Meaningful use complete.   Patient aware of their 9:30am-12/13/2022 telephone appointment w/ Laurence Aly PA-C. This concludes the nursing interview.       [name], LPN

## 2022-12-13 NOTE — Progress Notes (Signed)
Radiation Oncology         [phone] ________________________________  Outpatient Follow Up   Name: Joseph Atkins MRN: [id number]  Date of Service: 12/13/2022  DOB: 01-29-1961   Diagnosis:  Stage IV, cT3, N0, M1c, non-small cell lung cancer, adenocarcinoma of the right lower lobe  Prior Radiation:    06/19/2020 SRS Treatment: Each site was treated to 20 Gy in 1 fraction PTV_14 Rt Cereb 5mm PTV_13 Rt Occ 4mm PTV_12 Rt Occ 5mm PTV_11 Lt Front 3mm PTV_10 Rt Par 4mm PTV_9 Lt Par 3mm  03/05/20 SRS Treatment: The following lesions were treated to 20 Gy in 1 fraction PTV2 Rt Temp 3mm PTV3 Lt Occ 6mm PTV4 Lt Occ 4mm PTV5 Rt Temp 6mm PTV6 Lt Occ 8mm PTV7 Rt Par 8mm PTV8 Lt Par 11mm  11/25/19 - 01/09/20: The patient was treated to the disease within the right lung initially to a dose of 60 Gy using a 4 field, 3-D conformal technique. The patient then received a cone down boost treatment for an additional 6 Gy. This yielded a final total dose of 66 Gy.   11/14/19 SRS Treatment:  PTV1 Lt Cerebellum 28 mm, 18 Gy in 1 fraction  Narrative: This is a pleasant 62 y.o. male with a history of stage IV non-small cell lung cancer, adenocarcinoma arising in the right lower lobe with what was originally a solitary brain metastasis.  He was treated with stereotactic radiosurgery [facility]) in July 2021, and definitive chemoradiation to the lung which he completed in September 2021 followed by [facility] consolidation.  The patient had a difficult time with trying to taper his steroids with what resulted in rebound edema following the tapering or discontinuing certain doses, and having to go back to steroids.  Ultimately he was treated with Avastin.  He had SRS to new disease sites in November 2021 and in February 2022.  He has also continued on systemic Keytruda and consolidation with Dr. Arbutus Ped and had BP elevations with Avastinwhich was last administered in March 2022. Keytruda continued until November  2023.   He resumed Keytruda on 11/16/22 after scans showed an increase in his RLL tumor that was PET avid on 11/04/22 measuring 4.4 cm, with an SUV of 13.37. No other sites of disease were appreciated. He had repeat MRI of the brain on 12/07/22 that showe concerns for new disease. However after much discussion, fusion by our physics team, and review in multidisciplinary brain oncology conference showed that there was a lesion about 3 mm that was seen in the right occipital lobe that has not been treated, see below. The report of the MRI however showed there was concern of 3 new lesions: two in the right occipital lobe, and right temporal lobe. Stability overall in the left cerebellar lesion, and some mild changes of other treated sites have what appear to be inflammatory changes. He's seen today to discuss these results.     On review of systems, the patient reports that he is doing okay now that he's been on about 4 days of dexamethasone 2 mg daily. He was having dizziness and felt off balance, which has improved. No other complaints are verbalized.   PAST MEDICAL HISTORY:  Past Medical History:  Diagnosis Date   Cancer (HCC)    lung cancer   Diverticulosis    GERD (gastroesophageal reflux disease)    Hx of small bowel obstruction    Hyperlipidemia    Hypertension    Hypothyroidism    IBS (irritable bowel  syndrome)    Substance abuse (HCC)    Alcoholic, Drug addition   Thyroid disease     PAST SURGICAL HISTORY: Past Surgical History:  Procedure Laterality Date   arm surgery Right    BRONCHIAL BRUSHINGS  10/24/2019   Procedure: BRONCHIAL BRUSHINGS;  Surgeon: Leslye Peer, MD;  Location: [facility] ENDOSCOPY;  Service: Cardiopulmonary;;  right lower lobe    BRONCHIAL BRUSHINGS  11/05/2019   Procedure: BRONCHIAL BRUSHINGS;  Surgeon: Leslye Peer, MD;  Location: [facility] ENDOSCOPY;  Service: Pulmonary;;   BRONCHIAL NEEDLE ASPIRATION BIOPSY  10/24/2019   Procedure: BRONCHIAL NEEDLE ASPIRATION  BIOPSIES;  Surgeon: Leslye Peer, MD;  Location: [facility] ENDOSCOPY;  Service: Cardiopulmonary;;   BRONCHIAL NEEDLE ASPIRATION BIOPSY  11/05/2019   Procedure: BRONCHIAL NEEDLE ASPIRATION BIOPSIES;  Surgeon: Leslye Peer, MD;  Location: [facility] ENDOSCOPY;  Service: Pulmonary;;   ENDOBRONCHIAL ULTRASOUND N/A 10/24/2019   Procedure: ENDOBRONCHIAL ULTRASOUND;  Surgeon: Leslye Peer, MD;  Location: [facility] ENDOSCOPY;  Service: Cardiopulmonary;  Laterality: N/A;   FINGER SURGERY Right    Middle   IR IMAGING GUIDED PORT INSERTION  11/19/2019   VIDEO BRONCHOSCOPY N/A 10/24/2019   Procedure: VIDEO BRONCHOSCOPY WITHOUT FLUORO;  Surgeon: Leslye Peer, MD;  Location: [facility] ENDOSCOPY;  Service: Cardiopulmonary;  Laterality: N/A;   VIDEO BRONCHOSCOPY WITH ENDOBRONCHIAL NAVIGATION N/A 11/05/2019   Procedure: VIDEO BRONCHOSCOPY WITH ENDOBRONCHIAL NAVIGATION;  Surgeon: Leslye Peer, MD;  Location: [facility] ENDOSCOPY;  Service: Pulmonary;  Laterality: N/A;    PAST SOCIAL HISTORY:  Social History   Socioeconomic History   Marital status: Married    Spouse name: Not on file   Number of children: 2   Years of education: Not on file   Highest education level: Not on file  Occupational History   Occupation: supervisor    Employer: [employer]  Tobacco Use   Smoking status: Former    Current packs/day: 1.50    Types: Cigarettes   Smokeless tobacco: Former    Types: Associate Professor status: Never Used  Substance and Sexual Activity   Alcohol use: No    Comment: Alcoholic clean for 6 years   Drug use: No    Comment: Recovering Drug Addict-clean for 6 years   Sexual activity: Not on file  Other Topics Concern   Not on file  Social History Narrative   Not on file   Social Determinants of Health   Financial Resource Strain: Medium Risk (10/31/2019)   Overall Financial Resource Strain (CARDIA)    Difficulty of Paying Living Expenses: Somewhat hard  Food Insecurity: No Food Insecurity (10/31/2019)    Hunger Vital Sign    Worried About Running Out of Food in the Last Year: Never true    Ran Out of Food in the Last Year: Never true  Transportation Needs: No Transportation Needs (10/31/2019)   PRAPARE - Administrator, Civil Service (Medical): No    Lack of Transportation (Non-Medical): No  Physical Activity: Insufficiently Active (10/31/2019)   Exercise Vital Sign    Days of Exercise per Week: 5 days    Minutes of Exercise per Session: 20 min  Stress: Stress Concern Present (10/31/2019)   [employer] of Occupational Health - Occupational Stress Questionnaire    Feeling of Stress : Very much  Social Connections: Moderately Isolated (10/31/2019)   Social Connection and Isolation Panel [NHANES]    Frequency of Communication with Friends and Family: More than three times a week  Frequency of Social Gatherings with Friends and Family: More than three times a week    Attends Religious Services: Never    Database administrator or Organizations: No    Attends Banker Meetings: Never    Marital Status: Married  Catering manager Violence: Not At Risk (10/31/2019)   Humiliation, Afraid, Rape, and Kick questionnaire    Fear of Current or Ex-Partner: No    Emotionally Abused: No    Physically Abused: No    Sexually Abused: No  The patient is married and lives in [place]. He enjoys working on cars, shooting guns, and has been sober from drugs and alcohol for several years.  PAST FAMILY HISTORY: Family History  Problem Relation Age of Onset   Epilepsy Mother    Heart disease Mother    Heart disease Brother    Lung cancer Paternal Uncle     MEDICATIONS  Current Outpatient Medications  Medication Sig Dispense Refill   albuterol (PROVENTIL HFA;VENTOLIN HFA) 108 (90 Base) MCG/ACT inhaler Inhale 2 puffs into the lungs every 6 (six) hours as needed for wheezing or shortness of breath. 1 Inhaler 0   amLODipine (NORVASC) 5 MG tablet TAKE 1 TABLET (5 MG TOTAL) BY MOUTH  DAILY. 90 tablet 1   ANORO ELLIPTA 62.5-25 MCG/INH AEPB 1 puff daily.     augmented betamethasone dipropionate (DIPROLENE-AF) 0.05 % cream Apply topically 2 (two) times daily.     [facility] ASPIRIN LOW DOSE 81 MG tablet TAKE 1 TABLET (81 MG TOTAL) BY MOUTH DAILY. SWALLOW WHOLE. 90 tablet 3   cyclobenzaprine (FLEXERIL) 10 MG tablet Take 10 mg by mouth 2 (two) times daily as needed for muscle spasms.     dexamethasone (DECADRON) 2 MG tablet Take 1 tablet (2 mg total) by mouth daily. 30 tablet 0   docusate sodium (COLACE) 100 MG capsule Take 100 mg by mouth daily.     DULoxetine (CYMBALTA) 20 MG capsule Take 1 capsule (20 mg total) by mouth 2 (two) times daily. 60 capsule 5   fluticasone furoate-vilanterol (BREO ELLIPTA) 100-25 MCG/INH AEPB Inhale 1 puff into the lungs daily. 30 each 3   hydrOXYzine (ATARAX) 25 MG tablet Take 25 mg by mouth at bedtime.     levothyroxine (SYNTHROID) 100 MCG tablet TAKE 1 TABLET BY MOUTH EVERY DAY 90 tablet 1   lidocaine-prilocaine (EMLA) cream Apply 1 application topically as needed. 30 g 0   omeprazole (PRILOSEC) 40 MG capsule Take 1 capsule (40 mg total) by mouth daily. 90 capsule 4   ondansetron (ZOFRAN) 4 MG tablet Take 1 tablet (4 mg total) by mouth every 8 (eight) hours as needed. 40 tablet 2   oxyCODONE-acetaminophen (PERCOCET/ROXICET) 5-325 MG tablet Take 1 tablet by mouth every 4 (four) hours as needed for severe pain. 30 tablet 0   pembrolizumab (KEYTRUDA) 100 MG/4ML SOLN See admin instructions.     polyethylene glycol powder (MIRALAX) powder Take 17 g by mouth daily. 255 g 11   prochlorperazine (COMPAZINE) 10 MG tablet Take 1 tablet (10 mg total) by mouth every 6 (six) hours as needed. 30 tablet 2   temazepam (RESTORIL) 30 MG capsule Take 1 capsule (30 mg total) by mouth at bedtime as needed for sleep. 30 capsule 0   triamcinolone cream (KENALOG) 0.1 % APPLY TOPICALLY 2 TIMES DAILY AS NEEDED. 454 g 0   TRULANCE 3 MG TABS Take 1 tablet by mouth daily.     No  current facility-administered medications for this visit.  ALLERGIES:  Allergies  Allergen Reactions   Penicillins Other (See Comments)    Childhood allergy.  Has patient had a PCN reaction causing immediate rash, facial/tongue/throat swelling, SOB or lightheadedness with hypotension: unknown Has patient had a PCN reaction causing severe rash involving mucus membranes or skin necrosis: unknown Has patient had a PCN reaction that required hospitalization: unknown Has patient had a PCN reaction occurring within the last 10 years: no If all of the above answers are "NO", then may proceed with Cephalosporin use.    Physical exam: Wt Readings from Last 3 Encounters:  12/13/22 184 lb 3.2 oz (83.6 kg)  12/08/22 182 lb 9.6 oz (82.8 kg)  11/16/22 187 lb (84.8 kg)   Temp Readings from Last 3 Encounters:  12/13/22 (!) 97.4 F (36.3 C) (Temporal)  12/08/22 (!) 97.3 F (36.3 C) (Temporal)  11/16/22 98.1 F (36.7 C) (Oral)   BP Readings from Last 3 Encounters:  12/13/22 122/74  12/08/22 120/75  11/16/22 (!) 128/94   Pulse Readings from Last 3 Encounters:  12/13/22 86  12/08/22 88  11/16/22 100   In general this is a well appearing caucasian male in no acute distress. He's alert and oriented x4 and appropriate throughout the examination. Cardiopulmonary assessment is negative for acute distress and he exhibits normal effort.     Impression/Plan: 1. Stage IV, cT3, N0, M1c, non-small cell lung cancer, adenocarcinoma of the right lower lobe with progressive brain disease.  Dr. [name] and our brain oncology team have reviewed his imaging and discussed his case extensively with our physics department which has provided fusion of his prior therapy. The right occipital lesion is the only area of concern that has not been treated, and given the small size at this time, Dr. [name] would recommend SRS to this site in a single fraction. We reviewed the risks, benefits, short, and long term effects  of therapy. He is in agreement to proceed. Written consent is obtained and placed in the chart, a copy was provided to the patient. The patient will be contacted to coordinate treatment planning by our simulation department. He will also proceed with immunotherapy with Dr. Arbutus Ped as well. Dr. [name] is aware of the changes in the lung and could consider options of re-irradiation pending his response to therapy with immunotherapy, but we will look for these results after his next CT scan.  2. Edema, Infarct, and chronic vessel disease and radionecrosis on imaging. We appreciate Dr. Liana Gerold input and he will continue to manage the patient's symptoms. He is tolerating Dexamethasone 2 mg daily and this has improved his symptoms. We will follow this expectantly.     In a visit lasting 45 minutes, greater than 50% of the time was spent face to face discussing the patient's condition, in preparation for the discussion, and coordinating the patient's care.     Osker Mason, PAC

## 2022-12-15 NOTE — Progress Notes (Signed)
Has armband been applied?  Yes.    Does patient have an allergy to IV contrast dye?: No   Has patient ever received premedication for IV contrast dye?: n/a   Does patient take metformin?: No.  If patient does take metformin when was the last dose: n/a  Date of lab work: 12/08/2022 BUN: 6 CR: 0.91 eGfr: >60  IV site: Right Chest Port  Has IV site been added to flowsheet?  Yes.     BP 128/81   Pulse (!) 109   Temp (!) 97.5 F (36.4 C)   Resp 20   Wt 184 lb (83.5 kg)   SpO2 100%   BMI 27.98 kg/m

## 2022-12-16 ENCOUNTER — Ambulatory Visit
Admission: RE | Admit: 2022-12-16 | Discharge: 2022-12-16 | Disposition: A | Discharge status: Home / Self Care | Payer: Managed Care, Other (non HMO) | Source: Ambulatory Visit | Attending: Radiation Oncology | Admitting: Radiation Oncology

## 2022-12-16 ENCOUNTER — Ambulatory Visit: Payer: Managed Care, Other (non HMO) | Admitting: Radiation Oncology

## 2022-12-16 VITALS — BP 128/81 | HR 109 | Temp 97.5°F | Resp 20 | Wt 184.0 lb

## 2022-12-16 DIAGNOSIS — C3431 Malignant neoplasm of lower lobe, right bronchus or lung: Secondary | ICD-10-CM | POA: Insufficient documentation

## 2022-12-16 DIAGNOSIS — C7931 Secondary malignant neoplasm of brain: Secondary | ICD-10-CM | POA: Insufficient documentation

## 2022-12-16 DIAGNOSIS — Z510 Encounter for antineoplastic radiation therapy: Secondary | ICD-10-CM | POA: Insufficient documentation

## 2022-12-16 DIAGNOSIS — C3491 Malignant neoplasm of unspecified part of right bronchus or lung: Secondary | ICD-10-CM

## 2022-12-16 DIAGNOSIS — Z95828 Presence of other vascular implants and grafts: Secondary | ICD-10-CM

## 2022-12-16 MED ORDER — SODIUM CHLORIDE 0.9% FLUSH
10.0000 mL | Freq: Once | INTRAVENOUS | Status: AC
  Administered 2022-12-16: 10 mL via INTRAVENOUS

## 2022-12-16 MED ORDER — SODIUM CHLORIDE FLUSH 0.9 % IV SOLN: 10.0000 mL | Freq: Once | INTRAVENOUS | Status: DC

## 2022-12-16 MED ORDER — HEPARIN SOD (PORK) LOCK FLUSH 100 UNIT/ML IV SOLN
500.0000 [IU] | Freq: Once | INTRAVENOUS | Status: AC
  Administered 2022-12-16: 500 [IU] via INTRAVENOUS

## 2022-12-18 ENCOUNTER — Other Ambulatory Visit: Payer: Self-pay

## 2022-12-20 ENCOUNTER — Other Ambulatory Visit: Payer: Self-pay | Admitting: Physician Assistant

## 2022-12-20 ENCOUNTER — Other Ambulatory Visit: Payer: Self-pay | Admitting: Internal Medicine

## 2022-12-21 DIAGNOSIS — Z510 Encounter for antineoplastic radiation therapy: Secondary | ICD-10-CM | POA: Diagnosis not present

## 2022-12-22 ENCOUNTER — Ambulatory Visit
Admission: RE | Admit: 2022-12-22 | Discharge: 2022-12-22 | Disposition: A | Discharge status: Home / Self Care | Payer: Managed Care, Other (non HMO) | Source: Ambulatory Visit | Attending: Radiation Oncology | Admitting: Radiation Oncology

## 2022-12-22 ENCOUNTER — Other Ambulatory Visit: Payer: Self-pay

## 2022-12-22 VITALS — BP 134/79 | HR 92 | Temp 97.4°F | Resp 20 | Ht 68.0 in

## 2022-12-22 DIAGNOSIS — Z510 Encounter for antineoplastic radiation therapy: Secondary | ICD-10-CM | POA: Diagnosis not present

## 2022-12-22 DIAGNOSIS — F321 Major depressive disorder, single episode, moderate: Secondary | ICD-10-CM

## 2022-12-22 DIAGNOSIS — J441 Chronic obstructive pulmonary disease with (acute) exacerbation: Secondary | ICD-10-CM

## 2022-12-22 LAB — RAD ONC ARIA SESSION SUMMARY
Course Elapsed Days: 0
Plan Fractions Treated to Date: 1
Plan Prescribed Dose Per Fraction: 20 Gy
Plan Total Fractions Prescribed: 1
Plan Total Prescribed Dose: 20 Gy
Reference Point Dosage Given to Date: 20 Gy
Reference Point Session Dosage Given: 20 Gy
Session Number: 1

## 2022-12-22 NOTE — Progress Notes (Signed)
Joseph Atkins rested with [name] for 30 minutes following his SRS treatment.  Patient denies headache, dizziness, nausea, diplopia or ringing in the ears. Denies fatigue. Patient without complaints. Understands to avoid strenuous activity for the next 24 hours and call [phone] with needs.   BP 134/79 (BP Location: Right Arm, Patient Position: Sitting, Cuff Size: Normal)   Pulse 92   Temp (!) 97.4 F (36.3 C)   Resp 20   Ht 5\' 8"  (1.727 m)   SpO2 100%   BMI 27.98 kg/m    Lind Covert RN, BSN

## 2022-12-23 NOTE — Radiation Completion Notes (Addendum)
  Radiation Oncology         [phone] ________________________________  Name: CYPRUS OSKEY MRN: [id number]  Date of Service: 12/22/2022  DOB: Jul 13, 1960  End of Treatment Note    Diagnosis: Stage IV, cT3, N0, M1c, non-small cell lung cancer, adenocarcinoma of the right lower lobe with progressive brain disease.   Intent: Palliative     ==========DELIVERED PLANS==========  First Treatment Date: 2022-12-22 - Last Treatment Date: 2022-12-22   Plan Name: Brain_SRS [id number]_Occipital_ Technique: SBRT/SRT-3D Mode: Photon Dose Per Fraction: 20 Gy Prescribed Dose (Delivered / Prescribed): 20 Gy / 20 Gy Prescribed Fxs (Delivered / Prescribed): 1 / 1     ==========ON TREATMENT VISIT DATES========== 2022-12-22      See On Treatment Notes in Epic for details. The patient tolerated radiation. He developed fatigue and anticipated skin changes in the treatment field.   The patient will receive a call in about one month from the radiation oncology department. He will continue follow up with Dr. Arbutus Ped as well as Dr. Barbaraann Cao in surveillance.      Osker Mason, PAC

## 2022-12-29 ENCOUNTER — Inpatient Hospital Stay: Payer: Managed Care, Other (non HMO)

## 2022-12-29 ENCOUNTER — Inpatient Hospital Stay (HOSPITAL_BASED_OUTPATIENT_CLINIC_OR_DEPARTMENT_OTHER): Payer: Managed Care, Other (non HMO) | Admitting: Internal Medicine

## 2022-12-29 ENCOUNTER — Inpatient Hospital Stay: Payer: Managed Care, Other (non HMO) | Attending: Internal Medicine

## 2022-12-29 VITALS — BP 116/69 | HR 87 | Temp 98.2°F | Resp 17 | Ht 68.0 in | Wt 185.5 lb

## 2022-12-29 DIAGNOSIS — K219 Gastro-esophageal reflux disease without esophagitis: Secondary | ICD-10-CM | POA: Diagnosis not present

## 2022-12-29 DIAGNOSIS — I10 Essential (primary) hypertension: Secondary | ICD-10-CM | POA: Insufficient documentation

## 2022-12-29 DIAGNOSIS — Z79899 Other long term (current) drug therapy: Secondary | ICD-10-CM | POA: Diagnosis not present

## 2022-12-29 DIAGNOSIS — C3491 Malignant neoplasm of unspecified part of right bronchus or lung: Secondary | ICD-10-CM | POA: Diagnosis not present

## 2022-12-29 DIAGNOSIS — C3431 Malignant neoplasm of lower lobe, right bronchus or lung: Secondary | ICD-10-CM | POA: Insufficient documentation

## 2022-12-29 DIAGNOSIS — K589 Irritable bowel syndrome without diarrhea: Secondary | ICD-10-CM | POA: Diagnosis not present

## 2022-12-29 DIAGNOSIS — E039 Hypothyroidism, unspecified: Secondary | ICD-10-CM | POA: Diagnosis not present

## 2022-12-29 DIAGNOSIS — Z801 Family history of malignant neoplasm of trachea, bronchus and lung: Secondary | ICD-10-CM | POA: Insufficient documentation

## 2022-12-29 DIAGNOSIS — C7931 Secondary malignant neoplasm of brain: Secondary | ICD-10-CM | POA: Diagnosis present

## 2022-12-29 DIAGNOSIS — Z7982 Long term (current) use of aspirin: Secondary | ICD-10-CM | POA: Diagnosis not present

## 2022-12-29 DIAGNOSIS — C3490 Malignant neoplasm of unspecified part of unspecified bronchus or lung: Secondary | ICD-10-CM

## 2022-12-29 DIAGNOSIS — Z7952 Long term (current) use of systemic steroids: Secondary | ICD-10-CM | POA: Insufficient documentation

## 2022-12-29 DIAGNOSIS — F1011 Alcohol abuse, in remission: Secondary | ICD-10-CM | POA: Diagnosis not present

## 2022-12-29 DIAGNOSIS — E785 Hyperlipidemia, unspecified: Secondary | ICD-10-CM | POA: Diagnosis not present

## 2022-12-29 DIAGNOSIS — G936 Cerebral edema: Secondary | ICD-10-CM | POA: Diagnosis not present

## 2022-12-29 DIAGNOSIS — Z79890 Hormone replacement therapy (postmenopausal): Secondary | ICD-10-CM | POA: Diagnosis not present

## 2022-12-29 DIAGNOSIS — Z87891 Personal history of nicotine dependence: Secondary | ICD-10-CM | POA: Insufficient documentation

## 2022-12-29 DIAGNOSIS — Z5112 Encounter for antineoplastic immunotherapy: Secondary | ICD-10-CM | POA: Insufficient documentation

## 2022-12-29 DIAGNOSIS — Z95828 Presence of other vascular implants and grafts: Secondary | ICD-10-CM

## 2022-12-29 LAB — CBC WITH DIFFERENTIAL (CANCER CENTER ONLY)
Abs Immature Granulocytes: 0.08 10*3/uL — ABNORMAL HIGH (ref 0.00–0.07)
Basophils Absolute: 0 10*3/uL (ref 0.0–0.1)
Basophils Relative: 0 %
Eosinophils Absolute: 0 10*3/uL (ref 0.0–0.5)
Eosinophils Relative: 0 %
HCT: 35.6 % — ABNORMAL LOW (ref 39.0–52.0)
Hemoglobin: 11.1 g/dL — ABNORMAL LOW (ref 13.0–17.0)
Immature Granulocytes: 1 %
Lymphocytes Relative: 7 %
Lymphs Abs: 0.8 10*3/uL (ref 0.7–4.0)
MCH: 28.1 pg (ref 26.0–34.0)
MCHC: 31.2 g/dL (ref 30.0–36.0)
MCV: 90.1 fL (ref 80.0–100.0)
Monocytes Absolute: 0.3 10*3/uL (ref 0.1–1.0)
Monocytes Relative: 3 %
Neutro Abs: 9.5 10*3/uL — ABNORMAL HIGH (ref 1.7–7.7)
Neutrophils Relative %: 89 %
Platelet Count: 277 10*3/uL (ref 150–400)
RBC: 3.95 MIL/uL — ABNORMAL LOW (ref 4.22–5.81)
RDW: 15.3 % (ref 11.5–15.5)
WBC Count: 10.7 10*3/uL — ABNORMAL HIGH (ref 4.0–10.5)
nRBC: 0 % (ref 0.0–0.2)

## 2022-12-29 LAB — CMP (CANCER CENTER ONLY)
ALT: 22 U/L (ref 0–44)
AST: 15 U/L (ref 15–41)
Albumin: 3.7 g/dL (ref 3.5–5.0)
Alkaline Phosphatase: 124 U/L (ref 38–126)
Anion gap: 8 (ref 5–15)
BUN: 10 mg/dL (ref 8–23)
CO2: 24 mmol/L (ref 22–32)
Calcium: 8.3 mg/dL — ABNORMAL LOW (ref 8.9–10.3)
Chloride: 101 mmol/L (ref 98–111)
Creatinine: 0.91 mg/dL (ref 0.61–1.24)
GFR, Estimated: >60 mL/min (ref >60)
Glucose, Bld: 265 mg/dL — ABNORMAL HIGH (ref 70–99)
Potassium: 4.2 mmol/L (ref 3.5–5.1)
Sodium: 133 mmol/L — ABNORMAL LOW (ref 135–145)
Total Bilirubin: 0.4 mg/dL (ref 0.3–1.2)
Total Protein: 6.4 g/dL — ABNORMAL LOW (ref 6.5–8.1)

## 2022-12-29 LAB — TSH: TSH: 2.75 u[IU]/mL (ref 0.350–4.500)

## 2022-12-29 MED ORDER — SODIUM CHLORIDE 0.9% FLUSH
10.0000 mL | INTRAVENOUS | Status: DC | PRN
  Administered 2022-12-29: 10 mL

## 2022-12-29 MED ORDER — SODIUM CHLORIDE 0.9 % IV SOLN
200.0000 mg | Freq: Once | INTRAVENOUS | Status: AC
  Administered 2022-12-29: 200 mg via INTRAVENOUS
  Filled 2022-12-29: qty 200

## 2022-12-29 MED ORDER — SODIUM CHLORIDE 0.9 % IV SOLN: Freq: Once | INTRAVENOUS | Status: AC

## 2022-12-29 MED ORDER — HEPARIN SOD (PORK) LOCK FLUSH 100 UNIT/ML IV SOLN
500.0000 [IU] | Freq: Once | INTRAVENOUS | Status: AC | PRN
  Administered 2022-12-29: 500 [IU]

## 2022-12-29 NOTE — Progress Notes (Signed)
[facility] [phone]   [phone]  OFFICE PROGRESS NOTE  [name], NP [address] [name] [address] [place] [address]  DIAGNOSIS: Stage IV (T3, N0, M1C) non-small cell lung cancer, adenocarcinoma.  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 He was diagnosed in July 2021.   Molecular Biomarkers:  MSI-High DETECTED Pembrolizumab Atezolizumab, Avelumab, Cemiplimab, Dostarlimab, Durvalumab, Ipilimumab, Nivolumab   STK11Splice Site SNV 1.9% Everolimus, Temsirolimus Yes   KRASG12D 1.7% Binimetinib Yes   [id number] 0.4%   Niraparib, Olaparib, Rucaparib, Talazoparib, Tazemetostat Yes   PRIOR THERAPY:  1) SRS to the solitary brain metastasis under the care of Dr. [name]. Last treatment 11/14/19. 2) Weekly concurrent chemoradiation with carboplatin for an AUC of 2, paclitaxel 45 mg/m2.  First dose expected on 11/25/2019. Status post 7 cycles, last dose was given 01/06/2020 with partial response.  3)  Immunotherapy with Keytruda 200 mg IV every 3 weeks.  First dose February 10, 2020 for a patient with MSI high.  Status post 35  cycles. 4) Avastin 15 mg/KG every 3 weeks.  First dose today for the vasogenic edema of the brain.S/P 7 cycles. 5) The patient had evidence for local disease recurrence in July 2024.   CURRENT THERAPY: Resuming his treatment with Keytruda 200 Mg IV every 3 weeks.  First dose 11/16/2022.  Status post 2 cycles.  INTERVAL HISTORY: Joseph Atkins 62 y.o. male returns to the clinic today for follow-up visit accompanied by his wife.  The patient is feeling fine today with no concerning complaints except for fatigue.  He is currently on Decadron 2 mg p.o. daily because of recent brain metastasis status post SBRT.  He denied having any current chest pain, shortness of breath, cough or hemoptysis.  He has no nausea, vomiting, diarrhea or  constipation.  He has no headache or visual changes.  He denied having any recent weight loss or night sweats.  He is here today for evaluation before starting cycle #3 of his treatment.  MEDICAL HISTORY: Past Medical History:  Diagnosis Date   Cancer (HCC)    lung cancer   Diverticulosis    GERD (gastroesophageal reflux disease)    Hx of small bowel obstruction    Hyperlipidemia    Hypertension    Hypothyroidism    IBS (irritable bowel syndrome)    Substance abuse (HCC)    Alcoholic, Drug addition   Thyroid disease     ALLERGIES:  is allergic to penicillins.  MEDICATIONS:  Current Outpatient Medications  Medication Sig Dispense Refill   albuterol (PROVENTIL HFA;VENTOLIN HFA) 108 (90 Base) MCG/ACT inhaler Inhale 2 puffs into the lungs every 6 (six) hours as needed for wheezing or shortness of breath. 1 Inhaler 0   amLODipine (NORVASC) 5 MG tablet TAKE 1 TABLET (5 MG TOTAL) BY MOUTH DAILY. 90 tablet 1   ANORO ELLIPTA 62.5-25 MCG/INH AEPB 1 puff daily.     augmented betamethasone dipropionate (DIPROLENE-AF) 0.05 % cream Apply topically 2 (two) times daily.     [facility] ASPIRIN LOW DOSE 81 MG tablet TAKE 1 TABLET (81 MG TOTAL) BY MOUTH DAILY. SWALLOW WHOLE. 90 tablet 3   cyclobenzaprine (FLEXERIL) 10 MG tablet Take 10 mg by mouth 2 (two) times daily as needed for muscle spasms.     dexamethasone (DECADRON) 2 MG tablet Take 1 tablet (2 mg total) by mouth daily. 30 tablet 0  docusate sodium (COLACE) 100 MG capsule Take 100 mg by mouth daily.     DULoxetine (CYMBALTA) 20 MG capsule Take 1 capsule (20 mg total) by mouth 2 (two) times daily. 60 capsule 5   fluticasone furoate-vilanterol (BREO ELLIPTA) 100-25 MCG/INH AEPB Inhale 1 puff into the lungs daily. 30 each 3   hydrOXYzine (ATARAX) 25 MG tablet Take 25 mg by mouth at bedtime.     levothyroxine (SYNTHROID) 100 MCG tablet TAKE 1 TABLET BY MOUTH EVERY DAY 90 tablet 1   lidocaine-prilocaine (EMLA) cream Apply 1 application topically as  needed. 30 g 0   omeprazole (PRILOSEC) 40 MG capsule Take 1 capsule (40 mg total) by mouth daily. 90 capsule 4   ondansetron (ZOFRAN) 4 MG tablet Take 1 tablet (4 mg total) by mouth every 8 (eight) hours as needed. 40 tablet 2   oxyCODONE-acetaminophen (PERCOCET/ROXICET) 5-325 MG tablet Take 1 tablet by mouth every 4 (four) hours as needed for severe pain. 30 tablet 0   pembrolizumab (KEYTRUDA) 100 MG/4ML SOLN See admin instructions.     polyethylene glycol powder (MIRALAX) powder Take 17 g by mouth daily. 255 g 11   prochlorperazine (COMPAZINE) 10 MG tablet Take 1 tablet (10 mg total) by mouth every 6 (six) hours as needed. 30 tablet 2   temazepam (RESTORIL) 30 MG capsule Take 1 capsule (30 mg total) by mouth at bedtime as needed for sleep. 30 capsule 0   triamcinolone cream (KENALOG) 0.1 % APPLY TOPICALLY 2 TIMES DAILY AS NEEDED. 454 g 0   TRULANCE 3 MG TABS Take 1 tablet by mouth daily.     No current facility-administered medications for this visit.   Facility-Administered Medications Ordered in Other Visits  Medication Dose Route Frequency Provider Last Rate Last Admin   sodium chloride flush (NS) 0.9 % injection 10 mL  10 mL Intracatheter PRN Si Gaul, MD   10 mL at 12/29/22 0831    SURGICAL HISTORY:  Past Surgical History:  Procedure Laterality Date   arm surgery Right    BRONCHIAL BRUSHINGS  10/24/2019   Procedure: BRONCHIAL BRUSHINGS;  Surgeon: Leslye Peer, MD;  Location: [facility] ENDOSCOPY;  Service: Cardiopulmonary;;  right lower lobe    BRONCHIAL BRUSHINGS  11/05/2019   Procedure: BRONCHIAL BRUSHINGS;  Surgeon: Leslye Peer, MD;  Location: [facility] ENDOSCOPY;  Service: Pulmonary;;   BRONCHIAL NEEDLE ASPIRATION BIOPSY  10/24/2019   Procedure: BRONCHIAL NEEDLE ASPIRATION BIOPSIES;  Surgeon: Leslye Peer, MD;  Location: [facility] ENDOSCOPY;  Service: Cardiopulmonary;;   BRONCHIAL NEEDLE ASPIRATION BIOPSY  11/05/2019   Procedure: BRONCHIAL NEEDLE ASPIRATION BIOPSIES;  Surgeon: Leslye Peer, MD;  Location: [facility] ENDOSCOPY;  Service: Pulmonary;;   ENDOBRONCHIAL ULTRASOUND N/A 10/24/2019   Procedure: ENDOBRONCHIAL ULTRASOUND;  Surgeon: Leslye Peer, MD;  Location: [facility] ENDOSCOPY;  Service: Cardiopulmonary;  Laterality: N/A;   FINGER SURGERY Right    Middle   IR IMAGING GUIDED PORT INSERTION  11/19/2019   VIDEO BRONCHOSCOPY N/A 10/24/2019   Procedure: VIDEO BRONCHOSCOPY WITHOUT FLUORO;  Surgeon: Leslye Peer, MD;  Location: [facility] ENDOSCOPY;  Service: Cardiopulmonary;  Laterality: N/A;   VIDEO BRONCHOSCOPY WITH ENDOBRONCHIAL NAVIGATION N/A 11/05/2019   Procedure: VIDEO BRONCHOSCOPY WITH ENDOBRONCHIAL NAVIGATION;  Surgeon: Leslye Peer, MD;  Location: [facility] ENDOSCOPY;  Service: Pulmonary;  Laterality: N/A;    REVIEW OF SYSTEMS:  A comprehensive review of systems was negative except for: Constitutional: positive for fatigue   PHYSICAL EXAMINATION: General appearance: alert, cooperative, fatigued, and no distress Head: Normocephalic,  without obvious abnormality, atraumatic Neck: no adenopathy, no JVD, supple, symmetrical, trachea midline, and thyroid not enlarged, symmetric, no tenderness/mass/nodules Lymph nodes: Cervical, supraclavicular, and axillary nodes normal. Resp: clear to auscultation bilaterally Back: symmetric, no curvature. ROM normal. No CVA tenderness. Cardio: regular rate and rhythm, S1, S2 normal, no murmur, click, rub or gallop GI: soft, non-tender; bowel sounds normal; no masses,  no organomegaly Extremities: extremities normal, atraumatic, no cyanosis or edema  ECOG PERFORMANCE STATUS: 1 - Symptomatic but completely ambulatory  Blood pressure 116/69, pulse 87, temperature 98.2 F (36.8 C), temperature source Oral, resp. rate 17, height 5\' 8"  (1.727 m), weight 185 lb 8 oz (84.1 kg), SpO2 100%.  LABORATORY DATA: Lab Results  Component Value Date   WBC 9.5 12/08/2022   HGB 10.9 (L) 12/08/2022   HCT 34.0 (L) 12/08/2022   MCV 88.1 12/08/2022   PLT 384  12/08/2022      Chemistry      Component Value Date/Time   NA 135 12/08/2022 1346   NA 141 03/08/2017 1707   K 3.7 12/08/2022 1346   CL 102 12/08/2022 1346   CO2 26 12/08/2022 1346   BUN 6 12/08/2022 1346   BUN 12 03/08/2017 1707   CREATININE 0.91 12/08/2022 1346      Component Value Date/Time   CALCIUM 7.9 (L) 12/08/2022 1346   ALKPHOS 123 12/08/2022 1346   AST 14 (L) 12/08/2022 1346   ALT 15 12/08/2022 1346   BILITOT 0.3 12/08/2022 1346       RADIOGRAPHIC STUDIES: MR Brain W Wo Contrast  Result Date: 12/07/2022 CLINICAL DATA:  62 year old male with metastatic lung cancer status post SRS in 2021 and 2022. PET-CT last month raising the possibility of recurrent right lower lung tumor. Restaging. EXAM: MRI HEAD WITHOUT AND WITH CONTRAST TECHNIQUE: Multiplanar, multiecho pulse sequences of the brain and surrounding structures were obtained without and with intravenous contrast. CONTRAST:  8mL GADAVIST GADOBUTROL 1 MMOL/ML IV SOLN COMPARISON:  Brain MRI 07/18/2022 and 01/08/2022. FINDINGS: Brain: Persistent enhancing inferior left cerebellar mass now measures up to 29 mm long axis, with ongoing stellate margins. Compared to last year, the left lateral margin of the mass does appear mildly contracted. Chronic hemosiderin. Surrounding cerebellar T2 and FLAIR hyperintensity has slightly regressed from last year. No regional mass effect. However, linear enhancement along the medial inferior margin of the mass is more conspicuous on series 1100, image 100, such that leptomeningeal involvement there is difficult to exclude. However, no disseminated leptomeningeal enhancement or dural thickening is identified today. Pre-existing medial left occipital lobe metastasis on series 1100, image 148 has not significantly changed from last year. But on that image a new contralateral punctate right occipital metastasis is demonstrated, and a second new nearby right occipital punctate metastasis is on image  151. 3 mm pre-existing metastasis in the superior right occipital lobe on series 1100, image 159 is more apparent from September of last year, although only slightly increased since March. Contralateral left superior cerebellar metastasis is stable on image 164. Small new 2-3 mm posterior right temporal lobe enhancing metastasis on image 163. Stable right posterior frontal lobe metastasis on series 1100, image 220. Stable left superior perirolandic metastasis on image 229. No edema or mass effect associated with the new enhancing lesions. Mild pre-existing T2/FLAIR hyperintensity at the other sites is stable. No ependymal enhancement. Overall brain volume remains normal. No midline shift or intracranial mass effect. No superimposed restricted diffusion to suggest acute infarction. No ventriculomegaly, extra-axial collection or acute  intracranial hemorrhage. Cervicomedullary junction and pituitary are within normal limits. Underlying nonspecific moderate for age scattered nonenhancing white matter FLAIR hyperintensity is stable. No other chronic cerebral blood products. There are small chronic cystic areas of encephalomalacia in the right thalamus and pons. Vascular: Major intracranial vascular flow voids are stable. Skull and upper cervical spine: Visualized bone marrow signal is within normal limits. Negative visible cervical spine and spinal cord. Sinuses/Orbits: Negative. Other: Mastoids remain clear. Grossly normal visible internal auditory structures. IMPRESSION: 1. Progression of disease: - three new punctate/2 mm enhancing metastases. Two of these are in the medial right occipital lobe. See series 1100, images 148, 151, 163. - stellate chronic left inferior cerebellar metastasis stable (2.9 cm) except for increased conspicuity of curvilinear enhancement along its inferomedial margin. Difficult to exclude leptomeningeal involvement there. But no other leptomeningeal disease. - four additional treated  metastases are stable, but a small chronic metastasis also in the right occipital lobe on series 1100 image 159 has increased from 1 year ago, slightly larger since March. 2. No acute cerebral edema or intracranial mass effect. Electronically Signed   By: Odessa Fleming M.D.   On: 12/07/2022 12:55     ASSESSMENT AND PLAN: This is a very pleasant 62 years old white male with stage IV (T3, N0, M1c) non-small cell lung cancer, adenocarcinoma with MSI high presented with right lower lobe/infrahilar mass in addition to solitary brain metastasis in the left cerebellum diagnosed in July 2021. He is status post SRS to the solitary brain metastasis. The patient completed a course of concurrent chemoradiation with weekly carboplatin and paclitaxel.  He tolerated the treatment well except for fatigue and mild odynophagia. The patient has MSI high and I recommended for him treatment with immunotherapy with single agent Keytruda 200 mg IV every 3 weeks for a total of 2 years unless the patient has unacceptable toxicity or disease progression. He is status post 35  cycles of treatment with Keytruda.  He also received 7 cycles of Avastin for the vasogenic edema in the brain.   Avastin will be on hold for now unless needed in the future. The patient tolerated his previous treatment with immunotherapy fairly well. The patient has been on observation and he is feeling fine with no concerning complaints except for the mild cough and wheezing. His restaging scan in June 2024 showed no concerning findings for disease progression except for interval increase in the adjacent postobstructive airspace disease close to the treated mass of the infra right lower lobe concerning for local disease recurrence.  He had a PET scan performed on 11/04/2022.  I personally and independently reviewed the scan images before the final report became available.  It showed increased radiotracer uptake associated with the masslike architectural distortion  within the central aspect of the right lower lobe concerning for local disease recurrence.  But there was no evidence for nodal metastasis or distant metastatic disease. I discussed the result with the patient and his wife.  The patient received curative radiotherapy to this area in the past and he may not be a great candidate for reirradiation.  He has MSI high and I recommended for him to consider resuming his treatment with Keytruda again at 200 Mg IV every 3 weeks.  He was also given the option of close monitoring and observation. The patient decided to resume his treatment with Keytruda 200 Mg IV every 3 weeks.  Status post 2 cycles. He has been tolerating this treatment well with no concerning adverse  effects except for fatigue. I recommended for him to proceed with cycle #3 today as planned. I will see him back for follow-up visit in 3 weeks for evaluation after repeating CT scan of the chest, abdomen and pelvis for restaging of his disease. The patient was advised to call immediately if he has any other concerning symptoms in the interval. The patient voices understanding of current disease status and treatment options and is in agreement with the current care plan.  All questions were answered. The patient knows to call the clinic with any problems, questions or concerns. We can certainly see the patient much sooner if necessary. The total time spent in the appointment was 20 minutes.  Disclaimer: This note was dictated with voice recognition software. Similar sounding words can inadvertently be transcribed and may not be corrected upon review.

## 2022-12-29 NOTE — Patient Instructions (Signed)
Joseph Atkins  Discharge Instructions: Thank you for choosing [facility] to provide your oncology and hematology care.   If you have a lab appointment with the [facility], please go directly to the [facility] and check in at the registration area.   Wear comfortable clothing and clothing appropriate for easy access to any Portacath or PICC line.   We strive to give you quality time with your provider. You may need to reschedule your appointment if you arrive late (15 or more minutes).  Arriving late affects you and other patients whose appointments are after yours.  Also, if you miss three or more appointments without notifying the office, you may be dismissed from the clinic at the provider's discretion.      For prescription refill requests, have your pharmacy contact our office and allow 72 hours for refills to be completed.    Today you received the following chemotherapy and/or immunotherapy agents: Keytruda      To help prevent nausea and vomiting after your treatment, we encourage you to take your nausea medication as directed.  BELOW ARE SYMPTOMS THAT SHOULD BE REPORTED IMMEDIATELY: *FEVER GREATER THAN 100.4 F (38 C) OR HIGHER *CHILLS OR SWEATING *NAUSEA AND VOMITING THAT IS NOT CONTROLLED WITH YOUR NAUSEA MEDICATION *UNUSUAL SHORTNESS OF BREATH *UNUSUAL BRUISING OR BLEEDING *URINARY PROBLEMS (pain or burning when urinating, or frequent urination) *BOWEL PROBLEMS (unusual diarrhea, constipation, pain near the anus) TENDERNESS IN MOUTH AND THROAT WITH OR WITHOUT PRESENCE OF ULCERS (sore throat, sores in mouth, or a toothache) UNUSUAL RASH, SWELLING OR PAIN  UNUSUAL VAGINAL DISCHARGE OR ITCHING   Items with * indicate a potential emergency and should be followed up as soon as possible or go to the Emergency Department if any problems should occur.  Please show the CHEMOTHERAPY ALERT CARD or IMMUNOTHERAPY ALERT CARD at  check-in to the Emergency Department and triage nurse.  Should you have questions after your visit or need to cancel or reschedule your appointment, please contact [facility]  Dept: [phone]  and follow the prompts.  Office hours are 8:00 a.m. to 4:30 p.m. Monday - Friday. Please note that voicemails left after 4:00 p.m. may not be returned until the following business day.  We are closed weekends and major holidays. You have access to a nurse at all times for urgent questions. Please call the main number to the clinic Dept: [phone] and follow the prompts.   For any non-urgent questions, you may also contact your provider using MyChart. We now offer e-Visits for anyone 14 and older to request care online for non-urgent symptoms. For details visit [email or web address].   Also download the MyChart app! Go to the app store, search "MyChart", open the app, select [facility], and log in with your MyChart username and password.

## 2022-12-30 LAB — T4: T4, Total: 8.6 ug/dL (ref 4.5–12.0)

## 2023-01-04 ENCOUNTER — Inpatient Hospital Stay: Payer: Managed Care, Other (non HMO)

## 2023-01-05 DIAGNOSIS — F321 Major depressive disorder, single episode, moderate: Secondary | ICD-10-CM | POA: Insufficient documentation

## 2023-01-05 NOTE — Progress Notes (Signed)
  Name: Joseph Atkins  MRN: [id number]  Date: 12/22/2022   DOB: [birth year]  Stereotactic Radiosurgery Operative Note  PRE-OPERATIVE DIAGNOSIS:  Solitary Brain Metastasis  POST-OPERATIVE DIAGNOSIS:  Solitary Brain Metastasis  PROCEDURE:  Stereotactic Radiosurgery  SURGEON:  Stefani Dama, MD  NARRATIVE: The patient underwent a radiation treatment planning session in the radiation oncology simulation suite under the care of the radiation oncology physician and physicist.  I participated closely in the radiation treatment planning afterwards. The patient underwent planning CT which was fused to 3T high resolution MRI with 1 mm axial slices.  These images were fused on the planning system.  We contoured the gross target volumes and subsequently expanded this to yield the Planning Target Volume. I actively participated in the planning process.  I helped to define and review the target contours and also the contours of the optic pathway, eyes, brainstem and selected nearby organs at risk.  All the dose constraints for critical structures were reviewed and compared to AAPM Task Group 101.  The prescription dose conformity was reviewed.  I approved the plan electronically.    Accordingly, Joseph Atkins was brought to the TrueBeam stereotactic radiation treatment linac and placed in the custom immobilization mask.  The patient was aligned according to the IR fiducial markers with BrainLab Exactrac, then orthogonal x-rays were used in ExacTrac with the 6DOF robotic table and the shifts were made to align the patient  Joseph Atkins received stereotactic radiosurgery uneventfully.    The detailed description of the procedure is recorded in the radiation oncology procedure note.  I was present for the duration of the procedure.  DISPOSITION:  Following delivery, the patient was transported to nursing in stable condition and monitored for possible acute effects to be discharged to home in stable  condition with follow-up in one month.  Stefani Dama, MD 01/05/2023 11:14 AM

## 2023-01-06 ENCOUNTER — Other Ambulatory Visit: Payer: Self-pay | Admitting: Internal Medicine

## 2023-01-06 ENCOUNTER — Ambulatory Visit
Admission: RE | Admit: 2023-01-06 | Discharge: 2023-01-06 | Disposition: A | Discharge status: Home / Self Care | Payer: Managed Care, Other (non HMO) | Source: Ambulatory Visit | Attending: Internal Medicine | Admitting: Internal Medicine

## 2023-01-06 DIAGNOSIS — C3491 Malignant neoplasm of unspecified part of right bronchus or lung: Secondary | ICD-10-CM

## 2023-01-06 DIAGNOSIS — C3490 Malignant neoplasm of unspecified part of unspecified bronchus or lung: Secondary | ICD-10-CM

## 2023-01-06 MED ORDER — HEPARIN SOD (PORK) LOCK FLUSH 100 UNIT/ML IV SOLN
500.0000 [IU] | Freq: Once | INTRAVENOUS | Status: AC
  Administered 2023-01-06: 500 [IU] via INTRAVENOUS

## 2023-01-06 MED ORDER — SODIUM CHLORIDE 0.9% FLUSH
10.0000 mL | INTRAVENOUS | Status: DC | PRN
  Administered 2023-01-06: 10 mL via INTRAVENOUS

## 2023-01-06 MED ORDER — IOPAMIDOL (ISOVUE-300) INJECTION 61%
500.0000 mL | Freq: Once | INTRAVENOUS | Status: AC | PRN
  Administered 2023-01-06: 100 mL via INTRAVENOUS

## 2023-01-10 NOTE — Progress Notes (Signed)
[facility] OFFICE PROGRESS NOTE  [name], NP [address] [name] [address] [place] [address]  DIAGNOSIS: Stage IV (T3, N0, M1C) non-small cell lung cancer, adenocarcinoma.  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 He was diagnosed in July 2021.   Molecular Biomarkers:  MSI-High DETECTED Pembrolizumab Atezolizumab, Avelumab, Cemiplimab, Dostarlimab, Durvalumab, Ipilimumab, Nivolumab   STK11Splice Site SNV 1.9% Everolimus, Temsirolimus Yes   KRASG12D 1.7% Binimetinib Yes   [id number] 0.4%   Niraparib, Olaparib, Rucaparib, Talazoparib, Tazemetostat Yes  PRIOR THERAPY:  1) SRS to the solitary brain metastasis under the care of Dr. [name]. Last treatment 11/14/19. 2) Weekly concurrent chemoradiation with carboplatin for an AUC of 2, paclitaxel 45 mg/m2.  First dose expected on 11/25/2019. Status post 7 cycles, last dose was given 01/06/2020 with partial response.  3)  Immunotherapy with Keytruda 200 mg IV every 3 weeks.  First dose February 10, 2020 for a patient with MSI high.  Status post 35  cycles. 4) Avastin 15 mg/KG every 3 weeks.  First dose today for the vasogenic edema of the brain.S/P 7 cycles. 5) The patient had evidence for local disease recurrence in July 2024. 6) SRS to the occipital brain lesion under the care of Dr. [name] on 12/22/22  CURRENT THERAPY:  1) Resuming his treatment with Keytruda 200 Mg IV every 3 weeks. First dose 11/16/2022. Status post 3 cycles.  2) referral to radiation for consideration of repeat radiation to the primary lung lesion  INTERVAL HISTORY: Joseph Atkins 62 y.o. male returns to the clinic today for a follow up visit accompanied by his wife. The patient recently had evidence of disease progression with increased activity of the central aspect of the right lower lobe concerning for local disease recurrence. Therefore, he resumed  immunotherapy with [name]. He tolerates this well without any adverse side effects except in the past he has had pruritic nodular lesions from [name] for which he would use kenalog cream and zyrtec.   Since last being seen, the patient has endorsed a worsening cough for the last 2 to 3 weeks.  Denies any recent sick contacts or any other associated symptoms.  He has not tried taking anything for cough.  Coughs up clear phlegm.  Sometimes he has coughed so hard that he may vomit.  He also saw Dr. Barbaraann Cao yesterday who placed the patient back on steroids with Decadron 1 mg twice daily.  Once the patient's balance improves, he will titrate down to 1 mg daily.  The patient mentions that he has been having some intermittent swelling in the right upper extremity and left lower extremity that comes and goes.  Patient takes an aspirin every day.  Denies any pain or erythema.  The patient is not interested in pursuing any further evaluation with Doppler ultrasound.  Otherwise he is feeling fine today.  He denies any fever, night sweats, or unexplained weight loss. He denies any hemoptysis or chest pain.  Denies any diarrhea or constipation.  He recently had a restaging CT scan performed. He is here for evaluation and repeat blood work before undergoing cycle #4.    MEDICAL HISTORY: Past Medical History:  Diagnosis Date   Cancer [facility])    lung cancer   Diverticulosis    GERD (gastroesophageal reflux disease)    Hx of small bowel obstruction    Hyperlipidemia    Hypertension    Hypothyroidism  IBS (irritable bowel syndrome)    Substance abuse (HCC)    Alcoholic, Drug addition   Thyroid disease     ALLERGIES:  is allergic to penicillins.  MEDICATIONS:  Current Outpatient Medications  Medication Sig Dispense Refill   benzonatate (TESSALON) 100 MG capsule Take 1 capsule (100 mg total) by mouth 3 (three) times daily as needed. 30 capsule 2   albuterol (PROVENTIL HFA;VENTOLIN HFA) 108 (90 Base)  MCG/ACT inhaler Inhale 2 puffs into the lungs every 6 (six) hours as needed for wheezing or shortness of breath. 1 Inhaler 0   amLODipine (NORVASC) 5 MG tablet TAKE 1 TABLET (5 MG TOTAL) BY MOUTH DAILY. 90 tablet 1   ANORO ELLIPTA 62.5-25 MCG/INH AEPB 1 puff daily.     augmented betamethasone dipropionate (DIPROLENE-AF) 0.05 % cream Apply topically 2 (two) times daily.     [facility] ASPIRIN LOW DOSE 81 MG tablet TAKE 1 TABLET (81 MG TOTAL) BY MOUTH DAILY. SWALLOW WHOLE. 90 tablet 3   cyclobenzaprine (FLEXERIL) 10 MG tablet Take 10 mg by mouth 2 (two) times daily as needed for muscle spasms.     dexamethasone (DECADRON) 1 MG tablet Take 2 tablets (2 mg total) by mouth daily with breakfast. 60 tablet 1   docusate sodium (COLACE) 100 MG capsule Take 100 mg by mouth daily.     DULoxetine (CYMBALTA) 20 MG capsule Take 1 capsule (20 mg total) by mouth 2 (two) times daily. 60 capsule 5   fluticasone furoate-vilanterol (BREO ELLIPTA) 100-25 MCG/INH AEPB Inhale 1 puff into the lungs daily. 30 each 3   hydrOXYzine (ATARAX) 25 MG tablet Take 25 mg by mouth at bedtime.     levothyroxine (SYNTHROID) 100 MCG tablet TAKE 1 TABLET BY MOUTH EVERY DAY 90 tablet 1   lidocaine-prilocaine (EMLA) cream Apply 1 application topically as needed. 30 g 0   omeprazole (PRILOSEC) 40 MG capsule Take 1 capsule (40 mg total) by mouth daily. 90 capsule 4   ondansetron (ZOFRAN) 4 MG tablet Take 1 tablet (4 mg total) by mouth every 8 (eight) hours as needed. 40 tablet 2   oxyCODONE-acetaminophen (PERCOCET/ROXICET) 5-325 MG tablet Take 1 tablet by mouth every 4 (four) hours as needed for severe pain. 30 tablet 0   pembrolizumab (KEYTRUDA) 100 MG/4ML SOLN See admin instructions.     polyethylene glycol powder (MIRALAX) powder Take 17 g by mouth daily. 255 g 11   prochlorperazine (COMPAZINE) 10 MG tablet Take 1 tablet (10 mg total) by mouth every 6 (six) hours as needed. 30 tablet 2   temazepam (RESTORIL) 30 MG capsule Take 1 capsule (30 mg  total) by mouth at bedtime as needed for sleep. 30 capsule 0   triamcinolone cream (KENALOG) 0.1 % APPLY TOPICALLY 2 TIMES DAILY AS NEEDED. 454 g 0   TRULANCE 3 MG TABS Take 1 tablet by mouth daily.     No current facility-administered medications for this visit.    SURGICAL HISTORY:  Past Surgical History:  Procedure Laterality Date   arm surgery Right    BRONCHIAL BRUSHINGS  10/24/2019   Procedure: BRONCHIAL BRUSHINGS;  Surgeon: Leslye Peer, MD;  Location: [facility] ENDOSCOPY;  Service: Cardiopulmonary;;  right lower lobe    BRONCHIAL BRUSHINGS  11/05/2019   Procedure: BRONCHIAL BRUSHINGS;  Surgeon: Leslye Peer, MD;  Location[facility] ENDOSCOPY;  Service: Pulmonary;;   BRONCHIAL NEEDLE ASPIRATION BIOPSY  10/24/2019   Procedure: BRONCHIAL NEEDLE ASPIRATION BIOPSIES;  Surgeon: Leslye Peer, MD;  Location: [facility] ENDOSCOPY;  Service:  Cardiopulmonary;;   BRONCHIAL NEEDLE ASPIRATION BIOPSY  11/05/2019   Procedure: BRONCHIAL NEEDLE ASPIRATION BIOPSIES;  Surgeon: Leslye Peer, MD;  Location: [facility] ENDOSCOPY;  Service: Pulmonary;;   ENDOBRONCHIAL ULTRASOUND N/A 10/24/2019   Procedure: ENDOBRONCHIAL ULTRASOUND;  Surgeon: Leslye Peer, MD;  Location: [facility] ENDOSCOPY;  Service: Cardiopulmonary;  Laterality: N/A;   FINGER SURGERY Right    Middle   IR IMAGING GUIDED PORT INSERTION  11/19/2019   VIDEO BRONCHOSCOPY N/A 10/24/2019   Procedure: VIDEO BRONCHOSCOPY WITHOUT FLUORO;  Surgeon: Leslye Peer, MD;  Location: [facility] ENDOSCOPY;  Service: Cardiopulmonary;  Laterality: N/A;   VIDEO BRONCHOSCOPY WITH ENDOBRONCHIAL NAVIGATION N/A 11/05/2019   Procedure: VIDEO BRONCHOSCOPY WITH ENDOBRONCHIAL NAVIGATION;  Surgeon: Leslye Peer, MD;  Location: [facility] ENDOSCOPY;  Service: Pulmonary;  Laterality: N/A;    REVIEW OF SYSTEMS:   Review of Systems  Constitutional: Positive for fatigue.  Negative for appetite change, chills, fever and unexpected weight change.  HENT: Negative for mouth sores, nosebleeds, sore throat and  trouble swallowing.   Eyes: Positive for vision changes.  Negative for eye problems and icterus.  Respiratory: Shortness of breath with exertion and increased cough.  Negative for hemoptysis, and wheezing.   Cardiovascular: Positive for intermittent right lower extremity swelling and right arm swelling.  Negative for chest pain. Gastrointestinal: Negative for abdominal pain, constipation, diarrhea, nausea and vomiting.  Genitourinary: Negative for bladder incontinence, difficulty urinating, dysuria, frequency and hematuria.   Musculoskeletal: Negative for back pain, gait problem, neck pain and neck stiffness.  Skin: Positive for occasional rashes and itching.  Neurological: Positive for balance changes and gait disturbances.  Negative for dizziness, extremity weakness, headaches, light-headedness and seizures.  Hematological: Negative for adenopathy. Does not bruise/bleed easily.  Psychiatric/Behavioral: Negative for confusion, depression and sleep disturbance. The patient is not nervous/anxious.     PHYSICAL EXAMINATION:  Blood pressure 107/75, pulse (!) 105, temperature (!) 97.4 F (36.3 C), temperature source Oral, resp. rate 20, weight 185 lb 14.4 oz (84.3 kg), SpO2 96%.  ECOG PERFORMANCE STATUS: 1  Physical Exam  Constitutional: Oriented to person, place, and time and well-developed, well-nourished, and in no distress.  HENT:  Head: Normocephalic and atraumatic.  Mouth/Throat: Oropharynx is clear and moist. No oropharyngeal exudate.  Eyes: Conjunctivae are normal. Right eye exhibits no discharge. Left eye exhibits no discharge. No scleral icterus.  Neck: Normal range of motion. Neck supple.  Cardiovascular: Normal rate, regular rhythm, normal heart sounds and intact distal pulses.   Pulmonary/Chest: Effort normal and breath sounds normal. No respiratory distress. No wheezes. No rales.  Abdominal: Soft. Bowel sounds are normal. Exhibits no distension and no mass. There is no  tenderness.  Musculoskeletal: Normal range of motion. Exhibits no edema.  Lymphadenopathy:    No cervical adenopathy.  Neurological: Alert and oriented to person, place, and time. Exhibits normal muscle tone.  This is a cane for ambulation. Skin: Skin is warm and dry. Not diaphoretic. No erythema. No pallor.  Psychiatric: Mood, memory and judgment normal.  Vitals reviewed.  LABORATORY DATA: Lab Results  Component Value Date   WBC 10.5 01/18/2023   HGB 10.4 (L) 01/18/2023   HCT 32.8 (L) 01/18/2023   MCV 86.8 01/18/2023   PLT 415 (H) 01/18/2023      Chemistry      Component Value Date/Time   NA 131 (L) 01/18/2023 0836   NA 141 03/08/2017 1707   K 4.0 01/18/2023 0836   CL 100 01/18/2023 0836   CO2 24 01/18/2023 0836  BUN 6 (L) 01/18/2023 0836   BUN 12 03/08/2017 1707   CREATININE 1.02 01/18/2023 0836      Component Value Date/Time   CALCIUM 8.2 (L) 01/18/2023 0836   ALKPHOS 129 (H) 01/18/2023 0836   AST 17 01/18/2023 0836   ALT 17 01/18/2023 0836   BILITOT 0.7 01/18/2023 0836       RADIOGRAPHIC STUDIES:  CT CHEST ABDOMEN PELVIS W CONTRAST  Result Date: 01/17/2023 CLINICAL DATA:  Non-small-cell lung cancer. Restaging. Known brain metastasis. Radiation therapy and chemotherapy completed. Ongoing immunotherapy. * Tracking Code: BO * EXAM: CT CHEST, ABDOMEN, AND PELVIS WITH CONTRAST TECHNIQUE: Multidetector CT imaging of the chest, abdomen and pelvis was performed following the standard protocol during bolus administration of intravenous contrast. RADIATION DOSE REDUCTION: This exam was performed according to the departmental dose-optimization program which includes automated exposure control, adjustment of the mA and/or kV according to patient size and/or use of iterative reconstruction technique. CONTRAST:  ISOVUE-300 IOPAMIDOL (ISOVUE-300) INJECTION 61% COMPARISON:  11/04/2022 PET.  CTs of 10/13/2022 FINDINGS: CT CHEST FINDINGS Cardiovascular: Right Port-A-Cath tip  superior caval/atrial junction. Aortic atherosclerosis. Normal heart size similar small volume anterior pericardial fluid. Lad coronary artery calcification. No central pulmonary embolism, on this non-dedicated study. Mediastinum/Nodes: No supraclavicular adenopathy. No mediastinal or hilar adenopathy. Lungs/Pleura: No pleural fluid. Moderate centrilobular emphysema. Right lower lobe segmental to subsegmental endobronchial narrowing is relatively similar. 4 mm right lower lobe pulmonary nodule on 85/4 is similar back to 05/04/2022. The central right lower lobe mass with surrounding postobstructive volume loss is again identified. Measures 4.5 x 4.0 cm by 3.8 on 42/2 and 102 coronal versus 4.0 x 3.5 by 3.0 cm when remeasured in a similar fashion on 10/13/2022. Slight increase in surrounding postobstructive atelectasis and/or pneumonitis. Musculoskeletal: No acute osseous abnormality. CT ABDOMEN PELVIS FINDINGS Hepatobiliary: Normal liver. Normal gallbladder, without biliary ductal dilatation. Pancreas: Normal, without mass or ductal dilatation. Spleen: Normal in size, without focal abnormality. Adrenals/Urinary Tract: Normal adrenal glands. Normal kidneys, without hydronephrosis. Normal urinary bladder. Stomach/Bowel: Normal stomach, without wall thickening. Colonic stool burden suggests constipation. Normal terminal ileum and appendix. Normal small bowel. Vascular/Lymphatic: Advanced aortic and branch vessel atherosclerosis. Again identified is occlusion of the left common and external iliac with reconstitution of the left femoral via epigastric collateral. Retroaortic left renal vein. No abdominopelvic adenopathy. Reproductive: Normal prostate. Other: No significant free fluid. No evidence of omental or peritoneal disease. Musculoskeletal: No acute osseous abnormality. IMPRESSION: 1. Since the chest CT of 10/13/2022, enlargement of central right lower lobe lung mass with increase in surrounding postobstructive  atelectasis and/or pneumonitis. 2. No thoracic adenopathy or distant metastasis. 3. Right lower lobe 4 mm pulmonary nodule is stable back to at least 05/04/2022, favored to be benign. 4. Aortic atherosclerosis (ICD10-I70.0), coronary artery atherosclerosis and emphysema (ICD10-J43.9). Chronic left common/external iliac artery occlusion. 5.  Possible constipation. Electronically Signed   By: Jeronimo Greaves M.D.   On: 01/17/2023 14:22     ASSESSMENT/PLAN:  This is a very pleasant 62 year old Caucasian male diagnosed with stage IV non-small cell lung cancer, adenocarcinoma.  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 He was diagnosed in July 2021. His molecular studies by [employer] show he has MSI high which is a good marker for response to immunotherapy which will be important for future treatment.    The patient will complete SRS to the solidary brain metastasis under the care of Dr.  Moody later today on 11/14/19.    He then underwent a course of weekly concurrent chemoradiation with carboplatin for an AUC of 2 and paclitaxel 45 mg per metered squared.  He tolerated this treatment well except for fatigue and mild odynophagia.   The patient showed evidence of disease progression with an increase in volume of the right infrahilar mass.  Since the patient has MSI high, the patient then underwent treatment with single agent immunotherapy with Keytruda 200 mg IV every 3 weeks.   The patient is status post 35 cycles and completed his 2 years of treatment on 03/23/22.  He also received 7 cycles of Avastin for vasogenic edema in the brain.  Avastin has been on hold unless needed again in the future.    He has SRS to an occipital brain lesion on 12/22/22 by Dr. [name]   He had evidence of progression on his surveillance imaging from July 2024. Therefore, he resumed immunotherapy with Keytruda. He is status post 3 cycles.   The  patient was seen with Dr. Arbutus Ped today.  Dr. Arbutus Ped personally and independently reviewed the scan and discussed results with the patient today.  The scan showed the central right lower lobe lung mass is increased in size with increased postobstructive atelectasis or pneumonitis.  Dr. Arbutus Ped recommends Referral to radiation oncology to see if he is a candidate for reirradiation to this area.  If not then we will have to talk about adding chemotherapy onto his immunotherapy.  Labs were reviewed. Recommend that he proceed  with cycle #4 today as scheduled.    We will see him back for a follow up in 3 weeks for evaluation and repeat blood work before undergoing cycle #5.   The patient has Kenalog cream and will take Zyrtec if he has any trouble with itching with his immunotherapy as he has in the past.   Will continue taking Decadron as advised by Dr. Barbaraann Cao.  He is currently taking 1 mg twice daily and will decrease to 1 mg daily once his balance improves.  I recommended Doppler ultrasound of the right upper extremity and right lower extremity to rule out DVT.  Explained that the swelling could be secondary to the steroids.  The patient declined Doppler ultrasound at this time as the swelling comes and goes and is not as bad today.  I will send a prescription for Tessalon to the pharmacy for his cough.  He may also try using Delsym or Robitussin instead which is over-the-counter.  The patient was advised to call immediately if he has any concerning symptoms in the interval. The patient voices understanding of current disease status and treatment options and is in agreement with the current care plan. All questions were answered. The patient knows to call the clinic with any problems, questions or concerns. We can certainly see the patient much sooner if necessary   Orders Placed This Encounter  Procedures   Ambulatory referral to Radiation Oncology    Referral Priority:   Routine    Referral  Type:   Consultation    Referral Reason:   Specialty Services Required    Requested Specialty:   Radiation Oncology    Number of Visits Requested:   1     Bertin Inabinet L Aritza Brunet, PA-C 01/18/23  ADDENDUM: Hematology/Oncology Attending: I had a face-to-face encounter with the patient today.  I reviewed his record, lab, scan and recommended his care plan.  This is a very pleasant 62 years old white male  with a stage IV non-small cell lung cancer, adenocarcinoma diagnosed in July 2021 with brain metastasis and he has MSI high.  He has no other actionable mutations. He was treated with SRS to the brain metastasis followed by weekly concurrent chemoradiation for 7 cycles followed by systemic treatment with immunotherapy with Keytruda 200 Mg IV every 3 weeks for 2 years with good response.  The patient had evidence for local disease recurrence in July 2024 and he started treatment again with single agent Keytruda status post 3 cycles.  He has been tolerating this treatment well with no concerning adverse effects.  He recently started low-dose Decadron because of the vasogenic edema from the brain metastasis. He had repeat CT scan of the chest, abdomen and pelvis performed recently.  I personally and independently reviewed the scan images and discussed the result with the patient and his wife today. His scan showed enlargement of the central right lower lobe lung mass with increasing surrounding postobstructive atelectasis and/or pneumonitis.  He has no other evidence of metastatic disease and no thoracic lymphadenopathy. I recommended for the patient to continue his current treatment with Keytruda with the same dose but I will refer him to Dr. [name] with radiation oncology for consideration of short course of palliative radiotherapy to the central right lower lobe lung mass. If the patient is not a good candidate for reirradiation of this area, I may consider adding systemic chemotherapy to his current  treatment with Keytruda. He will come back for follow-up visit in 3 weeks for evaluation before starting cycle #5. He was advised to call immediately if he has any other concerning symptoms in the interval. The total time spent in the appointment was 30 minutes. Disclaimer: This note was dictated with voice recognition software. Similar sounding words can inadvertently be transcribed and may be missed upon review. Lajuana Matte, MD

## 2023-01-11 ENCOUNTER — Other Ambulatory Visit: Payer: Self-pay

## 2023-01-12 ENCOUNTER — Other Ambulatory Visit: Payer: Managed Care, Other (non HMO)

## 2023-01-12 ENCOUNTER — Other Ambulatory Visit: Payer: Self-pay | Admitting: Physician Assistant

## 2023-01-12 DIAGNOSIS — L299 Pruritus, unspecified: Secondary | ICD-10-CM

## 2023-01-17 ENCOUNTER — Inpatient Hospital Stay (HOSPITAL_BASED_OUTPATIENT_CLINIC_OR_DEPARTMENT_OTHER): Payer: Managed Care, Other (non HMO) | Admitting: Internal Medicine

## 2023-01-17 VITALS — BP 102/70 | HR 110 | Temp 97.7°F | Resp 20 | Wt 187.4 lb

## 2023-01-17 DIAGNOSIS — C7931 Secondary malignant neoplasm of brain: Secondary | ICD-10-CM | POA: Diagnosis not present

## 2023-01-17 MED ORDER — DEXAMETHASONE 1 MG PO TABS: 2.0000 mg | ORAL_TABLET | Freq: Every day | ORAL | 1 refills | Status: DC

## 2023-01-17 NOTE — Progress Notes (Signed)
[facility] at [facility] [address]  [place], [place] [address] [phone]   Interval Evaluation  Date of Service: 01/17/23 Patient Name: Joseph Atkins Patient MRN: [id number] Patient DOB: Nov 20, 1960 Provider: Henreitta Leber, MD  Identifying Statement:  Joseph Atkins is a 62 y.o. male with Malignant neoplasm metastatic to brain [facility]) [C79.31]   Primary Cancer:  Oncologic History: Oncology History  Adenocarcinoma of right lung, stage 4 (HCC)  11/14/2019 Initial Diagnosis   Adenocarcinoma of right lung, stage 4 (HCC)   11/25/2019 - 01/06/2020 Chemotherapy   The patient had palonosetron (ALOXI) injection 0.25 mg, 0.25 mg, Intravenous,  Once, 7 of 7 cycles Administration: 0.25 mg (11/25/2019), 0.25 mg (12/23/2019), 0.25 mg (12/31/2019), 0.25 mg (12/02/2019), 0.25 mg (01/06/2020), 0.25 mg (12/09/2019), 0.25 mg (12/16/2019) CARBOplatin (PARAPLATIN) 250 mg in sodium chloride 0.9 % 250 mL chemo infusion, 249.8 mg (100 % of original dose 249.8 mg), Intravenous,  Once, 7 of 7 cycles Dose modification: 249.8 mg (original dose 249.8 mg, Cycle 1) Administration: 250 mg (11/25/2019), 250 mg (12/23/2019), 250 mg (12/31/2019), 250 mg (12/02/2019), 250 mg (01/06/2020), 270 mg (12/09/2019), 250 mg (12/16/2019) PACLitaxel (TAXOL) 90 mg in sodium chloride 0.9 % 250 mL chemo infusion (</= 80mg /m2), 45 mg/m2 = 90 mg, Intravenous,  Once, 7 of 7 cycles Administration: 90 mg (11/25/2019), 90 mg (12/23/2019), 90 mg (12/31/2019), 90 mg (12/02/2019), 90 mg (01/06/2020), 90 mg (12/09/2019), 90 mg (12/16/2019)  for chemotherapy treatment.    02/18/2020 - 03/23/2022 Chemotherapy   Patient is on Treatment Plan : LUNG NSCLC Pembrolizumab (200) q21d     03/31/2020 - 07/21/2020 Chemotherapy    Patient is on Treatment Plan: LUNG NSCLC FLAT DOSE PEMBROLIZUMAB Q21D      05/19/2020 Cancer Staging   Staging form: Lung, AJCC 8th Edition - Clinical: Stage IVB (cT3, cN0, cM1c) - Signed by Si Gaul, MD on  05/19/2020   11/16/2022 -  Chemotherapy   Patient is on Treatment Plan : LUNG NSCLC Pembrolizumab (200) q21d      CNS Oncologic History 11/14/19: Completes single frx SRS to 2.4cm cerebellar metastasis 03/05/20: SRS to 7 targets Joseph Atkins) 06/19/20: SRS to 6 additional targets Joseph Atkins) 12/22/22: SRS R occipital Joseph Atkins)    Interval History:  Joseph Atkins presents today for follow up after recent SRS treatments.  He describes recurrence of balance issues from prior.  Symptoms declined when he stopped the decadron on 9/14.  He is needing to use a walker.  Also having more headaches and impaired memory.  He does complain of ongoing fatigue and low energy, not worse from prior. Continues Keytruda for disease recurrence with Dr. Arbutus Ped.  H+P (01/24/20) Patient presents today to discuss recent recurrence of neurologic symptoms following radiosurgery in July 2021.  He describes poor balance, wide based walking, needing to hold on to family or objects to walk safely.  He also describes nausea and dizzy-headed feeling at times.  Overall he is functioning in an impaired and sluggish way.  Symptoms have become noticeable since decreasing decadron to less than 4mg  daily; currently he is dosing 2mg  daily.  He felt "quite good" immediately after radiation and during higher dose steroid therapy.  No complications from decadron that he can report.  Recently completed chemoradioatherapy induction for lung adenocarcinoma with Dr. Arbutus Ped.  Medications: Current Outpatient Medications on File Prior to Visit  Medication Sig Dispense Refill   albuterol (PROVENTIL HFA;VENTOLIN HFA) 108 (90 Base) MCG/ACT inhaler Inhale 2 puffs into the lungs  every 6 (six) hours as needed for wheezing or shortness of breath. 1 Inhaler 0   amLODipine (NORVASC) 5 MG tablet TAKE 1 TABLET (5 MG TOTAL) BY MOUTH DAILY. 90 tablet 1   ANORO ELLIPTA 62.5-25 MCG/INH AEPB 1 puff daily.     augmented betamethasone dipropionate (DIPROLENE-AF) 0.05  % cream Apply topically 2 (two) times daily.     [facility] ASPIRIN LOW DOSE 81 MG tablet TAKE 1 TABLET (81 MG TOTAL) BY MOUTH DAILY. SWALLOW WHOLE. 90 tablet 3   cyclobenzaprine (FLEXERIL) 10 MG tablet Take 10 mg by mouth 2 (two) times daily as needed for muscle spasms.     docusate sodium (COLACE) 100 MG capsule Take 100 mg by mouth daily.     DULoxetine (CYMBALTA) 20 MG capsule Take 1 capsule (20 mg total) by mouth 2 (two) times daily. 60 capsule 5   fluticasone furoate-vilanterol (BREO ELLIPTA) 100-25 MCG/INH AEPB Inhale 1 puff into the lungs daily. 30 each 3   hydrOXYzine (ATARAX) 25 MG tablet Take 25 mg by mouth at bedtime.     levothyroxine (SYNTHROID) 100 MCG tablet TAKE 1 TABLET BY MOUTH EVERY DAY 90 tablet 1   lidocaine-prilocaine (EMLA) cream Apply 1 application topically as needed. 30 g 0   omeprazole (PRILOSEC) 40 MG capsule Take 1 capsule (40 mg total) by mouth daily. 90 capsule 4   ondansetron (ZOFRAN) 4 MG tablet Take 1 tablet (4 mg total) by mouth every 8 (eight) hours as needed. 40 tablet 2   oxyCODONE-acetaminophen (PERCOCET/ROXICET) 5-325 MG tablet Take 1 tablet by mouth every 4 (four) hours as needed for severe pain. 30 tablet 0   pembrolizumab (KEYTRUDA) 100 MG/4ML SOLN See admin instructions.     polyethylene glycol powder (MIRALAX) powder Take 17 g by mouth daily. 255 g 11   prochlorperazine (COMPAZINE) 10 MG tablet Take 1 tablet (10 mg total) by mouth every 6 (six) hours as needed. 30 tablet 2   temazepam (RESTORIL) 30 MG capsule Take 1 capsule (30 mg total) by mouth at bedtime as needed for sleep. 30 capsule 0   triamcinolone cream (KENALOG) 0.1 % APPLY TOPICALLY 2 TIMES DAILY AS NEEDED. 454 g 0   TRULANCE 3 MG TABS Take 1 tablet by mouth daily.     dexamethasone (DECADRON) 2 MG tablet Take 1 tablet (2 mg total) by mouth daily. (Patient not taking: Reported on 01/17/2023) 30 tablet 0   No current facility-administered medications on file prior to visit.    Allergies:   Allergies  Allergen Reactions   Penicillins Other (See Comments)    Childhood allergy.  Has patient had a PCN reaction causing immediate rash, facial/tongue/throat swelling, SOB or lightheadedness with hypotension: unknown Has patient had a PCN reaction causing severe rash involving mucus membranes or skin necrosis: unknown Has patient had a PCN reaction that required hospitalization: unknown Has patient had a PCN reaction occurring within the last 10 years: no If all of the above answers are "NO", then may proceed with Cephalosporin use.    Past Medical History:  Past Medical History:  Diagnosis Date   Cancer (HCC)    lung cancer   Diverticulosis    GERD (gastroesophageal reflux disease)    Hx of small bowel obstruction    Hyperlipidemia    Hypertension    Hypothyroidism    IBS (irritable bowel syndrome)    Substance abuse (HCC)    Alcoholic, Drug addition   Thyroid disease    Past Surgical History:  Past Surgical  History:  Procedure Laterality Date   arm surgery Right    BRONCHIAL BRUSHINGS  10/24/2019   Procedure: BRONCHIAL BRUSHINGS;  Surgeon: Leslye Peer, MD;  Location: [facility] ENDOSCOPY;  Service: Cardiopulmonary;;  right lower lobe    BRONCHIAL BRUSHINGS  11/05/2019   Procedure: BRONCHIAL BRUSHINGS;  Surgeon: Leslye Peer, MD;  Location: [facility] ENDOSCOPY;  Service: Pulmonary;;   BRONCHIAL NEEDLE ASPIRATION BIOPSY  10/24/2019   Procedure: BRONCHIAL NEEDLE ASPIRATION BIOPSIES;  Surgeon: Leslye Peer, MD;  Location: [facility] ENDOSCOPY;  Service: Cardiopulmonary;;   BRONCHIAL NEEDLE ASPIRATION BIOPSY  11/05/2019   Procedure: BRONCHIAL NEEDLE ASPIRATION BIOPSIES;  Surgeon: Leslye Peer, MD;  Location[facility] ENDOSCOPY;  Service: Pulmonary;;   ENDOBRONCHIAL ULTRASOUND N/A 10/24/2019   Procedure: ENDOBRONCHIAL ULTRASOUND;  Surgeon: Leslye Peer, MD;  Location: [facility] ENDOSCOPY;  Service: Cardiopulmonary;  Laterality: N/A;   FINGER SURGERY Right    Middle   IR IMAGING GUIDED PORT  INSERTION  11/19/2019   VIDEO BRONCHOSCOPY N/A 10/24/2019   Procedure: VIDEO BRONCHOSCOPY WITHOUT FLUORO;  Surgeon: Leslye Peer, MD;  Location: [facility] ENDOSCOPY;  Service: Cardiopulmonary;  Laterality: N/A;   VIDEO BRONCHOSCOPY WITH ENDOBRONCHIAL NAVIGATION N/A 11/05/2019   Procedure: VIDEO BRONCHOSCOPY WITH ENDOBRONCHIAL NAVIGATION;  Surgeon: Leslye Peer, MD;  Location: [facility] ENDOSCOPY;  Service: Pulmonary;  Laterality: N/A;   Social History:  Social History   Socioeconomic History   Marital status: Married    Spouse name: Not on file   Number of children: 2   Years of education: Not on file   Highest education level: Not on file  Occupational History   Occupation: supervisor    Employer: [employer]  Tobacco Use   Smoking status: Former    Current packs/day: 1.50    Types: Cigarettes   Smokeless tobacco: Former    Types: Associate Professor status: Never Used  Substance and Sexual Activity   Alcohol use: No    Comment: Alcoholic clean for 6 years   Drug use: No    Comment: Recovering Drug Addict-clean for 6 years   Sexual activity: Not on file  Other Topics Concern   Not on file  Social History Narrative   Not on file   Social Determinants of Health   Financial Resource Strain: Medium Risk (10/31/2019)   Overall Financial Resource Strain (CARDIA)    Difficulty of Paying Living Expenses: Somewhat hard  Food Insecurity: No Food Insecurity (12/13/2022)   Hunger Vital Sign    Worried About Running Out of Food in the Last Year: Never true    Ran Out of Food in the Last Year: Never true  Transportation Needs: No Transportation Needs (12/13/2022)   PRAPARE - Administrator, Civil Service (Medical): No    Lack of Transportation (Non-Medical): No  Physical Activity: Insufficiently Active (10/31/2019)   Exercise Vital Sign    Days of Exercise per Week: 5 days    Minutes of Exercise per Session: 20 min  Stress: Stress Concern Present (10/31/2019)   [employer] of Occupational Health - Occupational Stress Questionnaire    Feeling of Stress : Very much  Social Connections: Moderately Isolated (10/31/2019)   Social Connection and Isolation Panel [NHANES]    Frequency of Communication with Friends and Family: More than three times a week    Frequency of Social Gatherings with Friends and Family: More than three times a week    Attends Religious Services: Never    Active Member  of Clubs or Organizations: No    Attends Banker Meetings: Never    Marital Status: Married  Catering manager Violence: Not At Risk (12/13/2022)   Humiliation, Afraid, Rape, and Kick questionnaire    Fear of Current or Ex-Partner: No    Emotionally Abused: No    Physically Abused: No    Sexually Abused: No   Family History:  Family History  Problem Relation Age of Onset   Epilepsy Mother    Heart disease Mother    Heart disease Brother    Lung cancer Paternal Uncle     Review of Systems: Constitutional: Doesn't report fevers, chills or abnormal weight loss Eyes: Doesn't report blurriness of vision Ears, nose, mouth, throat, and face: Doesn't report sore throat Respiratory: Doesn't report cough, dyspnea or wheezes Cardiovascular: Doesn't report palpitation, chest discomfort  Gastrointestinal:  Doesn't report nausea, constipation, diarrhea GU: Doesn't report incontinence Skin: Doesn't report skin rashes Neurological: Per HPI Musculoskeletal: Doesn't report joint pain Behavioral/Psych: Doesn't report anxiety  Physical Exam: Wt Readings from Last 3 Encounters:  01/17/23 187 lb 6.4 oz (85 kg)  12/29/22 185 lb 8 oz (84.1 kg)  12/16/22 184 lb (83.5 kg)   Temp Readings from Last 3 Encounters:  01/17/23 97.7 F (36.5 C)  12/29/22 98.2 F (36.8 C) (Oral)  12/22/22 (!) 97.4 F (36.3 C)   BP Readings from Last 3 Encounters:  01/17/23 102/70  12/29/22 116/69  12/22/22 134/79   Pulse Readings from Last 3 Encounters:  01/17/23 (!) 110   12/29/22 87  12/22/22 92     KPS: 70. General: Alert, cooperative, pleasant, in no acute distress Head: Normal EENT: No conjunctival injection or scleral icterus.  Lungs: Resp effort normal Cardiac: Regular rate Abdomen: Non-distended abdomen Skin: No rashes cyanosis or petechiae. Extremities: No clubbing or edema  Neurologic Exam: Mental Status: Awake, alert, attentive to examiner. Oriented to self and environment. Language is fluent with intact comprehension.  Cranial Nerves: Visual acuity is grossly normal. Visual fields are full. Extra-ocular movements intact. No ptosis. Face is symmetric Motor: Tone and bulk are normal. Power is 4/5 in right leg. Reflexes are symmetric, no pathologic reflexes present.  Dysmetria L>R Sensory: Intact to light touch Gait: Dystaxic, wide based   Labs: I have reviewed the data as listed    Component Value Date/Time   NA 133 (L) 12/29/2022 0826   NA 141 03/08/2017 1707   K 4.2 12/29/2022 0826   CL 101 12/29/2022 0826   CO2 24 12/29/2022 0826   GLUCOSE 265 (H) 12/29/2022 0826   BUN 10 12/29/2022 0826   BUN 12 03/08/2017 1707   CREATININE 0.91 12/29/2022 0826   CALCIUM 8.3 (L) 12/29/2022 0826   PROT 6.4 (L) 12/29/2022 0826   PROT 7.1 03/08/2017 1707   ALBUMIN 3.7 12/29/2022 0826   ALBUMIN 4.5 03/08/2017 1707   AST 15 12/29/2022 0826   ALT 22 12/29/2022 0826   ALKPHOS 124 12/29/2022 0826   BILITOT 0.4 12/29/2022 0826   GFRNONAA >60 12/29/2022 0826   GFRAA >60 01/06/2020 0837   Lab Results  Component Value Date   WBC 10.7 (H) 12/29/2022   NEUTROABS 9.5 (H) 12/29/2022   HGB 11.1 (L) 12/29/2022   HCT 35.6 (L) 12/29/2022   MCV 90.1 12/29/2022   PLT 277 12/29/2022    Assessment/Plan Malignant neoplasm metastatic to brain [facility]) [C79.31]  Joseph Atkins is clinically progressive today, with repeat of worsening motor function and dystaxia seen last month.  This had  previously been responsive to corticosteroids.   Recommended  starting low dose of decadron, 2mg  daily, for focal symptoms.  This can be decreased to 1mg  daily after 7-10 days.  He will continue on Keytruda monotherapy with Dr. Arbutus Ped.  Otherwise we ask that Joseph Atkins return to clinic following November brain MRI, or sooner as needed.  We appreciate the opportunity to participate in the care of Joseph Atkins.   All questions were answered. The patient knows to call the clinic with any problems, questions or concerns. No barriers to learning were detected.  The total time spent in the encounter was 30 minutes and more than 50% was on counseling and review of test results   Henreitta Leber, MD Medical Director of Neuro-Oncology [facility] at [facility] 01/17/23 12:16 PM

## 2023-01-18 ENCOUNTER — Inpatient Hospital Stay (HOSPITAL_BASED_OUTPATIENT_CLINIC_OR_DEPARTMENT_OTHER): Payer: Managed Care, Other (non HMO) | Admitting: Physician Assistant

## 2023-01-18 ENCOUNTER — Inpatient Hospital Stay: Payer: Managed Care, Other (non HMO)

## 2023-01-18 VITALS — BP 107/75 | HR 105 | Temp 97.4°F | Resp 20 | Wt 185.9 lb

## 2023-01-18 DIAGNOSIS — C3491 Malignant neoplasm of unspecified part of right bronchus or lung: Secondary | ICD-10-CM

## 2023-01-18 DIAGNOSIS — Z5112 Encounter for antineoplastic immunotherapy: Secondary | ICD-10-CM

## 2023-01-18 DIAGNOSIS — R051 Acute cough: Secondary | ICD-10-CM | POA: Diagnosis not present

## 2023-01-18 DIAGNOSIS — C7931 Secondary malignant neoplasm of brain: Secondary | ICD-10-CM | POA: Diagnosis not present

## 2023-01-18 DIAGNOSIS — Z95828 Presence of other vascular implants and grafts: Secondary | ICD-10-CM

## 2023-01-18 LAB — CBC WITH DIFFERENTIAL (CANCER CENTER ONLY)
Abs Immature Granulocytes: 0.13 10*3/uL — ABNORMAL HIGH (ref 0.00–0.07)
Basophils Absolute: 0.1 10*3/uL (ref 0.0–0.1)
Basophils Relative: 1 %
Eosinophils Absolute: 0 10*3/uL (ref 0.0–0.5)
Eosinophils Relative: 0 %
HCT: 32.8 % — ABNORMAL LOW (ref 39.0–52.0)
Hemoglobin: 10.4 g/dL — ABNORMAL LOW (ref 13.0–17.0)
Immature Granulocytes: 1 %
Lymphocytes Relative: 6 %
Lymphs Abs: 0.6 10*3/uL — ABNORMAL LOW (ref 0.7–4.0)
MCH: 27.5 pg (ref 26.0–34.0)
MCHC: 31.7 g/dL (ref 30.0–36.0)
MCV: 86.8 fL (ref 80.0–100.0)
Monocytes Absolute: 0.5 10*3/uL (ref 0.1–1.0)
Monocytes Relative: 5 %
Neutro Abs: 9.1 10*3/uL — ABNORMAL HIGH (ref 1.7–7.7)
Neutrophils Relative %: 87 %
Platelet Count: 415 10*3/uL — ABNORMAL HIGH (ref 150–400)
RBC: 3.78 MIL/uL — ABNORMAL LOW (ref 4.22–5.81)
RDW: 15.6 % — ABNORMAL HIGH (ref 11.5–15.5)
WBC Count: 10.5 10*3/uL (ref 4.0–10.5)
nRBC: 0 % (ref 0.0–0.2)

## 2023-01-18 LAB — CMP (CANCER CENTER ONLY)
ALT: 17 U/L (ref 0–44)
AST: 17 U/L (ref 15–41)
Albumin: 3.4 g/dL — ABNORMAL LOW (ref 3.5–5.0)
Alkaline Phosphatase: 129 U/L — ABNORMAL HIGH (ref 38–126)
Anion gap: 7 (ref 5–15)
BUN: 6 mg/dL — ABNORMAL LOW (ref 8–23)
CO2: 24 mmol/L (ref 22–32)
Calcium: 8.2 mg/dL — ABNORMAL LOW (ref 8.9–10.3)
Chloride: 100 mmol/L (ref 98–111)
Creatinine: 1.02 mg/dL (ref 0.61–1.24)
GFR, Estimated: >60 mL/min (ref >60)
Glucose, Bld: 179 mg/dL — ABNORMAL HIGH (ref 70–99)
Potassium: 4 mmol/L (ref 3.5–5.1)
Sodium: 131 mmol/L — ABNORMAL LOW (ref 135–145)
Total Bilirubin: 0.7 mg/dL (ref 0.3–1.2)
Total Protein: 6.5 g/dL (ref 6.5–8.1)

## 2023-01-18 MED ORDER — SODIUM CHLORIDE 0.9% FLUSH
10.0000 mL | INTRAVENOUS | Status: DC | PRN
  Administered 2023-01-18: 10 mL

## 2023-01-18 MED ORDER — SODIUM CHLORIDE 0.9 % IV SOLN: Freq: Once | INTRAVENOUS | Status: AC

## 2023-01-18 MED ORDER — BENZONATATE 100 MG PO CAPS
100.0000 mg | ORAL_CAPSULE | Freq: Three times a day (TID) | ORAL | 2 refills | Status: DC | PRN
Start: 2023-01-18 — End: 2023-09-26

## 2023-01-18 MED ORDER — SODIUM CHLORIDE 0.9 % IV SOLN
200.0000 mg | Freq: Once | INTRAVENOUS | Status: AC
  Administered 2023-01-18: 200 mg via INTRAVENOUS
  Filled 2023-01-18: qty 200

## 2023-01-18 MED ORDER — HEPARIN SOD (PORK) LOCK FLUSH 100 UNIT/ML IV SOLN
500.0000 [IU] | Freq: Once | INTRAVENOUS | Status: AC | PRN
  Administered 2023-01-18: 500 [IU]

## 2023-01-18 NOTE — Progress Notes (Signed)
Per Cassie PA, ok to treat with elevated pulse.

## 2023-01-18 NOTE — Patient Instructions (Signed)

## 2023-01-23 ENCOUNTER — Ambulatory Visit
Admission: RE | Admit: 2023-01-23 | Discharge: 2023-01-23 | Disposition: A | Discharge status: Home / Self Care | Payer: Managed Care, Other (non HMO) | Source: Ambulatory Visit | Attending: Radiation Oncology | Admitting: Radiation Oncology

## 2023-01-23 NOTE — Progress Notes (Signed)
Radiation Oncology         [phone] ________________________________  Name: Joseph Atkins MRN: [id number]  Date of Service: 01/23/2023  DOB: 04-Feb-1961  Post Treatment Telephone Note  Diagnosis:  Stage IV, cT3, N0, M1c, non-small cell lung cancer, adenocarcinoma of the right lower lobe with progressive brain disease. (as documented in provider EOT note)  The patient was not available for call today.  The patient was counseled that he  will be contacted by our brain and spine navigator to schedule surveillance imaging. The patient was encouraged to call if he  have not received a call to schedule imaging, or if he  develops concerns or questions regarding radiation. The patient will also continue to follow up with Dr. Arbutus Ped in medical oncology, as well as Dr. Barbaraann Cao in surveillance.   [name], LPN

## 2023-01-24 NOTE — Progress Notes (Signed)
Radiation Oncology         [phone] ________________________________  Outpatient Follow Up   Name: Joseph Atkins MRN: [id number]  Date of Service: 01/26/2023  DOB: Jul 11, 1960   Diagnosis:  Stage IV, cT3, N0, M1c, non-small cell lung cancer, adenocarcinoma of the right lower lobe  Prior Radiation:    12/22/22 [facility] Treatment [id number] was treated to 20 Gy in 1 fraction  06/19/2020 SRS Treatment: Each site was treated to 20 Gy in 1 fraction PTV_14 Rt Cereb 5mm PTV_13 Rt Occ 4mm PTV_12 Rt Occ 5mm PTV_11 Lt Front 3mm PTV_10 Rt Par 4mm PTV_9 Lt Par 3mm  03/05/20 SRS Treatment: The following lesions were treated to 20 Gy in 1 fraction PTV2 Rt Temp 3mm PTV3 Lt Occ 6mm PTV4 Lt Occ 4mm PTV5 Rt Temp 6mm PTV6 Lt Occ 8mm PTV7 Rt Par 8mm PTV8 Lt Par 11mm  11/25/19 - 01/09/20: The patient was treated to the disease within the right lung initially to a dose of 60 Gy using a 4 field, 3-D conformal technique. The patient then received a cone down boost treatment for an additional 6 Gy. This yielded a final total dose of 66 Gy.   11/14/19 SRS Treatment:  PTV1 Lt Cerebellum 28 mm, 18 Gy in 1 fraction  Narrative: This is a pleasant 62 y.o. male with a history of stage IV non-small cell lung cancer, adenocarcinoma arising in the right lower lobe with what was originally a solitary brain metastasis.  He was treated with stereotactic radiosurgery [facility]) in July 2021, and definitive chemoradiation to the lung which he completed in September 2021 followed by [facility] consolidation.  The patient had a difficult time with trying to taper his steroids with what resulted in rebound edema following the tapering or discontinuing certain doses, and having to go back to steroids.  Ultimately he was treated with Avastin.  He had SRS to new disease sites in November 2021 and in February 2022.  He has also continued on systemic Keytruda and consolidation with Dr. Arbutus Ped and had BP elevations with  Avastin which was last administered in March 2022. Keytruda continued until November 2023.   He resumed Keytruda on 11/16/22 after scans showed an increase in his RLL tumor that was PET avid on 11/04/22 measuring 4.4 cm, with an SUV of 13.37. No other sites of disease were appreciated. He had repeat MRI of the brain on 12/07/22 that showe concerns for new disease. However after much discussion, fusion by our physics team, and review in multidisciplinary brain oncology conference showed that there was a lesion about 3 mm that was seen in the right occipital lobe that has not been treated, see below. The report of the MRI however showed there was concern of 3 new lesions: two in the right occipital lobe, and right temporal lobe. Stability overall in the left cerebellar lesion, and some mild changes of other treated sites have what appear to be inflammatory changes.  He went on to receive stereotactic right surgery to the right occipital lesion which he completed on 12/22/2022.    He had recent restaging CT chest abdomen pelvis on 01/06/2023 that showed an increase in the right lower lobe tumor measuring 4.5 cm in comparison to 4 cm previously in June 2024.  A slight increase in surrounding postobstructive atelectasis and/or pneumonitis was noted.  No additional disease is appreciated.  He continues to have evidence of atherosclerotic disease involving the coronary and aortic, and iliac vessels including a chronic left common external  iliac artery occlusion.  Given these findings he is seen to consider reirradiation.  On review of systems, the patient reports that he is doing fairly well. He describes fatigue and skin irritation especially the weeks of immunotherapy. He struggles to gain energy back. He denies any headaches, but states he went back to using Dexamethasone 2 mg, after trying to taper to 1 mg due to more imbalance and feeling dizzy. Since going back to 2 mg he feels better. No other complaints are  verbalized.  PAST MEDICAL HISTORY:  Past Medical History:  Diagnosis Date   Cancer (HCC)    lung cancer   Diverticulosis    GERD (gastroesophageal reflux disease)    Hx of small bowel obstruction    Hyperlipidemia    Hypertension    Hypothyroidism    IBS (irritable bowel syndrome)    Substance abuse (HCC)    Alcoholic, Drug addition   Thyroid disease     PAST SURGICAL HISTORY: Past Surgical History:  Procedure Laterality Date   arm surgery Right    BRONCHIAL BRUSHINGS  10/24/2019   Procedure: BRONCHIAL BRUSHINGS;  Surgeon: Leslye Peer, MD;  Location: [facility] ENDOSCOPY;  Service: Cardiopulmonary;;  right lower lobe    BRONCHIAL BRUSHINGS  11/05/2019   Procedure: BRONCHIAL BRUSHINGS;  Surgeon: Leslye Peer, MD;  Location: [facility] ENDOSCOPY;  Service: Pulmonary;;   BRONCHIAL NEEDLE ASPIRATION BIOPSY  10/24/2019   Procedure: BRONCHIAL NEEDLE ASPIRATION BIOPSIES;  Surgeon: Leslye Peer, MD;  Location: [facility] ENDOSCOPY;  Service: Cardiopulmonary;;   BRONCHIAL NEEDLE ASPIRATION BIOPSY  11/05/2019   Procedure: BRONCHIAL NEEDLE ASPIRATION BIOPSIES;  Surgeon: Leslye Peer, MD;  Location: [facility] ENDOSCOPY;  Service: Pulmonary;;   ENDOBRONCHIAL ULTRASOUND N/A 10/24/2019   Procedure: ENDOBRONCHIAL ULTRASOUND;  Surgeon: Leslye Peer, MD;  Location: [facility] ENDOSCOPY;  Service: Cardiopulmonary;  Laterality: N/A;   FINGER SURGERY Right    Middle   IR IMAGING GUIDED PORT INSERTION  11/19/2019   VIDEO BRONCHOSCOPY N/A 10/24/2019   Procedure: VIDEO BRONCHOSCOPY WITHOUT FLUORO;  Surgeon: Leslye Peer, MD;  Location: [facility] ENDOSCOPY;  Service: Cardiopulmonary;  Laterality: N/A;   VIDEO BRONCHOSCOPY WITH ENDOBRONCHIAL NAVIGATION N/A 11/05/2019   Procedure: VIDEO BRONCHOSCOPY WITH ENDOBRONCHIAL NAVIGATION;  Surgeon: Leslye Peer, MD;  Location: [facility] ENDOSCOPY;  Service: Pulmonary;  Laterality: N/A;    PAST SOCIAL HISTORY:  Social History   Socioeconomic History   Marital status: Married    Spouse name: Not on  file   Number of children: 2   Years of education: Not on file   Highest education level: Not on file  Occupational History   Occupation: supervisor    Employer: [employer]  Tobacco Use   Smoking status: Former    Current packs/day: 1.50    Types: Cigarettes   Smokeless tobacco: Former    Types: Associate Professor status: Never Used  Substance and Sexual Activity   Alcohol use: No    Comment: Alcoholic clean for 6 years   Drug use: No    Comment: Recovering Drug Addict-clean for 6 years   Sexual activity: Not on file  Other Topics Concern   Not on file  Social History Narrative   Not on file   Social Determinants of Health   Financial Resource Strain: Medium Risk (10/31/2019)   Overall Financial Resource Strain (CARDIA)    Difficulty of Paying Living Expenses: Somewhat hard  Food Insecurity: No Food Insecurity (12/13/2022)   Hunger Vital Sign  Worried About Programme researcher, broadcasting/film/video in the Last Year: Never true    Ran Out of Food in the Last Year: Never true  Transportation Needs: No Transportation Needs (12/13/2022)   PRAPARE - Administrator, Civil Service (Medical): No    Lack of Transportation (Non-Medical): No  Physical Activity: Insufficiently Active (10/31/2019)   Exercise Vital Sign    Days of Exercise per Week: 5 days    Minutes of Exercise per Session: 20 min  Stress: Stress Concern Present (10/31/2019)   [employer] of Occupational Health - Occupational Stress Questionnaire    Feeling of Stress : Very much  Social Connections: Moderately Isolated (10/31/2019)   Social Connection and Isolation Panel [NHANES]    Frequency of Communication with Friends and Family: More than three times a week    Frequency of Social Gatherings with Friends and Family: More than three times a week    Attends Religious Services: Never    Database administrator or Organizations: No    Attends Banker Meetings: Never    Marital Status: Married   Catering manager Violence: Not At Risk (12/13/2022)   Humiliation, Afraid, Rape, and Kick questionnaire    Fear of Current or Ex-Partner: No    Emotionally Abused: No    Physically Abused: No    Sexually Abused: No  The patient is married and lives in [place]. He enjoys working on cars, shooting guns, and has been sober from drugs and alcohol for several years.  PAST FAMILY HISTORY: Family History  Problem Relation Age of Onset   Epilepsy Mother    Heart disease Mother    Heart disease Brother    Lung cancer Paternal Uncle     MEDICATIONS  Current Outpatient Medications  Medication Sig Dispense Refill   albuterol (PROVENTIL HFA;VENTOLIN HFA) 108 (90 Base) MCG/ACT inhaler Inhale 2 puffs into the lungs every 6 (six) hours as needed for wheezing or shortness of breath. 1 Inhaler 0   amLODipine (NORVASC) 5 MG tablet TAKE 1 TABLET (5 MG TOTAL) BY MOUTH DAILY. 90 tablet 1   ANORO ELLIPTA 62.5-25 MCG/INH AEPB 1 puff daily.     augmented betamethasone dipropionate (DIPROLENE-AF) 0.05 % cream Apply topically 2 (two) times daily.     benzonatate (TESSALON) 100 MG capsule Take 1 capsule (100 mg total) by mouth 3 (three) times daily as needed. 30 capsule 2   [facility] ASPIRIN LOW DOSE 81 MG tablet TAKE 1 TABLET (81 MG TOTAL) BY MOUTH DAILY. SWALLOW WHOLE. 90 tablet 3   cyclobenzaprine (FLEXERIL) 10 MG tablet Take 10 mg by mouth 2 (two) times daily as needed for muscle spasms.     dexamethasone (DECADRON) 1 MG tablet Take 2 tablets (2 mg total) by mouth daily with breakfast. 60 tablet 1   docusate sodium (COLACE) 100 MG capsule Take 100 mg by mouth daily.     DULoxetine (CYMBALTA) 20 MG capsule Take 1 capsule (20 mg total) by mouth 2 (two) times daily. 60 capsule 5   fluticasone furoate-vilanterol (BREO ELLIPTA) 100-25 MCG/INH AEPB Inhale 1 puff into the lungs daily. 30 each 3   hydrOXYzine (ATARAX) 25 MG tablet Take 25 mg by mouth at bedtime.     levothyroxine (SYNTHROID) 100 MCG tablet TAKE 1 TABLET  BY MOUTH EVERY DAY 90 tablet 1   lidocaine-prilocaine (EMLA) cream Apply 1 application topically as needed. 30 g 0   omeprazole (PRILOSEC) 40 MG capsule Take 1 capsule (40 mg total) by mouth  daily. 90 capsule 4   ondansetron (ZOFRAN) 4 MG tablet Take 1 tablet (4 mg total) by mouth every 8 (eight) hours as needed. 40 tablet 2   oxyCODONE-acetaminophen (PERCOCET/ROXICET) 5-325 MG tablet Take 1 tablet by mouth every 4 (four) hours as needed for severe pain. 30 tablet 0   pembrolizumab (KEYTRUDA) 100 MG/4ML SOLN See admin instructions.     polyethylene glycol powder (MIRALAX) powder Take 17 g by mouth daily. 255 g 11   prochlorperazine (COMPAZINE) 10 MG tablet Take 1 tablet (10 mg total) by mouth every 6 (six) hours as needed. 30 tablet 2   temazepam (RESTORIL) 30 MG capsule Take 1 capsule (30 mg total) by mouth at bedtime as needed for sleep. 30 capsule 0   triamcinolone cream (KENALOG) 0.1 % APPLY TOPICALLY 2 TIMES DAILY AS NEEDED. 454 g 0   TRULANCE 3 MG TABS Take 1 tablet by mouth daily.     No current facility-administered medications for this visit.    ALLERGIES:  Allergies  Allergen Reactions   Penicillins Other (See Comments)    Childhood allergy.  Has patient had a PCN reaction causing immediate rash, facial/tongue/throat swelling, SOB or lightheadedness with hypotension: unknown Has patient had a PCN reaction causing severe rash involving mucus membranes or skin necrosis: unknown Has patient had a PCN reaction that required hospitalization: unknown Has patient had a PCN reaction occurring within the last 10 years: no If all of the above answers are "NO", then may proceed with Cephalosporin use.    Physical exam: Wt Readings from Last 3 Encounters:  01/18/23 185 lb 14.4 oz (84.3 kg)  01/17/23 187 lb 6.4 oz (85 kg)  12/29/22 185 lb 8 oz (84.1 kg)   Temp Readings from Last 3 Encounters:  01/18/23 (!) 97.4 F (36.3 C) (Oral)  01/17/23 97.7 F (36.5 C)  12/29/22 98.2 F (36.8  C) (Oral)   BP Readings from Last 3 Encounters:  01/18/23 107/75  01/17/23 102/70  12/29/22 116/69   Pulse Readings from Last 3 Encounters:  01/18/23 (!) 105  01/17/23 (!) 110  12/29/22 87   In general this is a well appearing caucasian male in no acute distress. He's alert and oriented x4 and appropriate throughout the examination. Cardiopulmonary assessment is negative for acute distress and he exhibits normal effort.     Impression/Plan: 1. Stage IV, cT3, N0, M1c, non-small cell lung cancer, adenocarcinoma of the right lower lobe with locally recurrent disease.  Dr. [name] has extensively reviewed the patient's case and is well familiar with the patient given his recent treatment.  He favors considering ultra hypofractionated radiation to the side of the RLL.  We discussed the risks, benefits, short, and long term effects of radiotherapy, as well as the curative intent, and the patient is interested in proceeding. Dr. [name] discusses the delivery and logistics of radiotherapy and anticipates a course of 10 fractions of ultra hypofractionated radiotherapy. Written consent is obtained and placed in the chart, a copy was provided to the patient. The patient will be contacted to coordinate treatment planning by our simulation department.  2. Edema, Infarct, and chronic vessel disease and radionecrosis on imaging. We appreciate Dr. Liana Gerold input and he will continue to manage the patient's symptoms. He is tolerating Dexamethasone 2 mg daily. We will follow this expectantly.     In a visit lasting 45 minutes, greater than 50% of the time was spent face to face discussing the patient's condition, in preparation for the discussion, and coordinating  the patient's care.     Osker Mason, PAC

## 2023-01-25 ENCOUNTER — Other Ambulatory Visit: Payer: Self-pay

## 2023-01-26 ENCOUNTER — Ambulatory Visit
Admission: RE | Admit: 2023-01-26 | Discharge: 2023-01-26 | Disposition: A | Discharge status: Home / Self Care | Payer: Managed Care, Other (non HMO) | Source: Ambulatory Visit | Attending: Radiation Oncology | Admitting: Radiation Oncology

## 2023-01-26 ENCOUNTER — Encounter: Payer: Self-pay | Admitting: Radiation Oncology

## 2023-01-26 VITALS — BP 112/62 | HR 103 | Temp 97.6°F | Resp 20 | Ht 68.0 in | Wt 188.4 lb

## 2023-01-26 DIAGNOSIS — Z7952 Long term (current) use of systemic steroids: Secondary | ICD-10-CM | POA: Diagnosis not present

## 2023-01-26 DIAGNOSIS — C7931 Secondary malignant neoplasm of brain: Secondary | ICD-10-CM | POA: Diagnosis not present

## 2023-01-26 DIAGNOSIS — J984 Other disorders of lung: Secondary | ICD-10-CM | POA: Insufficient documentation

## 2023-01-26 DIAGNOSIS — I10 Essential (primary) hypertension: Secondary | ICD-10-CM | POA: Insufficient documentation

## 2023-01-26 DIAGNOSIS — C3431 Malignant neoplasm of lower lobe, right bronchus or lung: Secondary | ICD-10-CM | POA: Diagnosis present

## 2023-01-26 DIAGNOSIS — Z79890 Hormone replacement therapy (postmenopausal): Secondary | ICD-10-CM | POA: Insufficient documentation

## 2023-01-26 DIAGNOSIS — E039 Hypothyroidism, unspecified: Secondary | ICD-10-CM | POA: Diagnosis not present

## 2023-01-26 DIAGNOSIS — C3491 Malignant neoplasm of unspecified part of right bronchus or lung: Secondary | ICD-10-CM

## 2023-01-26 DIAGNOSIS — Z801 Family history of malignant neoplasm of trachea, bronchus and lung: Secondary | ICD-10-CM | POA: Diagnosis not present

## 2023-01-26 DIAGNOSIS — Z923 Personal history of irradiation: Secondary | ICD-10-CM | POA: Diagnosis not present

## 2023-01-26 DIAGNOSIS — Z87891 Personal history of nicotine dependence: Secondary | ICD-10-CM | POA: Diagnosis not present

## 2023-01-26 DIAGNOSIS — E785 Hyperlipidemia, unspecified: Secondary | ICD-10-CM | POA: Insufficient documentation

## 2023-01-26 DIAGNOSIS — Z7951 Long term (current) use of inhaled steroids: Secondary | ICD-10-CM | POA: Diagnosis not present

## 2023-01-26 DIAGNOSIS — Z79899 Other long term (current) drug therapy: Secondary | ICD-10-CM | POA: Insufficient documentation

## 2023-01-26 DIAGNOSIS — F1011 Alcohol abuse, in remission: Secondary | ICD-10-CM | POA: Insufficient documentation

## 2023-01-26 DIAGNOSIS — K219 Gastro-esophageal reflux disease without esophagitis: Secondary | ICD-10-CM | POA: Diagnosis not present

## 2023-01-26 DIAGNOSIS — R609 Edema, unspecified: Secondary | ICD-10-CM | POA: Insufficient documentation

## 2023-01-26 NOTE — Progress Notes (Signed)
Nursing interview for Stage IV, cT3, N0, M1c, non-small cell lung cancer, adenocarcinoma of the right lower lobe.   Patient identity verified x2.  Patient reports occasional RT/central chest pain 4/10 w/ some moderate SOB, and fatigue. No other issues conveyed at this time.  Meaningful use complete.  Vitals- BP 112/62 (BP Location: Right Arm, Patient Position: Sitting, Cuff Size: Normal)   Pulse (!) 103   Temp 97.6 F (36.4 C) (Temporal)   Resp 20   Ht 5\' 8"  (1.727 m)   Wt 188 lb 6.4 oz (85.5 kg)   SpO2 100%   BMI 28.65 kg/m   This concludes the interaction.  [name], LPN

## 2023-01-27 ENCOUNTER — Other Ambulatory Visit: Payer: Self-pay | Admitting: Internal Medicine

## 2023-01-27 ENCOUNTER — Other Ambulatory Visit: Payer: Self-pay | Admitting: Physician Assistant

## 2023-01-27 DIAGNOSIS — L299 Pruritus, unspecified: Secondary | ICD-10-CM

## 2023-02-02 ENCOUNTER — Ambulatory Visit
Admission: RE | Admit: 2023-02-02 | Discharge: 2023-02-02 | Disposition: A | Discharge status: Home / Self Care | Payer: Managed Care, Other (non HMO) | Source: Ambulatory Visit | Attending: Radiation Oncology | Admitting: Radiation Oncology

## 2023-02-02 DIAGNOSIS — C7931 Secondary malignant neoplasm of brain: Secondary | ICD-10-CM | POA: Insufficient documentation

## 2023-02-02 DIAGNOSIS — Z510 Encounter for antineoplastic radiation therapy: Secondary | ICD-10-CM | POA: Insufficient documentation

## 2023-02-02 DIAGNOSIS — C3431 Malignant neoplasm of lower lobe, right bronchus or lung: Secondary | ICD-10-CM | POA: Insufficient documentation

## 2023-02-08 ENCOUNTER — Inpatient Hospital Stay (HOSPITAL_BASED_OUTPATIENT_CLINIC_OR_DEPARTMENT_OTHER): Payer: Managed Care, Other (non HMO) | Admitting: Internal Medicine

## 2023-02-08 ENCOUNTER — Inpatient Hospital Stay: Payer: Managed Care, Other (non HMO)

## 2023-02-08 ENCOUNTER — Inpatient Hospital Stay: Payer: Managed Care, Other (non HMO) | Attending: Internal Medicine

## 2023-02-08 DIAGNOSIS — Z5112 Encounter for antineoplastic immunotherapy: Secondary | ICD-10-CM | POA: Insufficient documentation

## 2023-02-08 DIAGNOSIS — E039 Hypothyroidism, unspecified: Secondary | ICD-10-CM | POA: Insufficient documentation

## 2023-02-08 DIAGNOSIS — Z79890 Hormone replacement therapy (postmenopausal): Secondary | ICD-10-CM | POA: Diagnosis not present

## 2023-02-08 DIAGNOSIS — C3491 Malignant neoplasm of unspecified part of right bronchus or lung: Secondary | ICD-10-CM | POA: Diagnosis not present

## 2023-02-08 DIAGNOSIS — F1010 Alcohol abuse, uncomplicated: Secondary | ICD-10-CM | POA: Insufficient documentation

## 2023-02-08 DIAGNOSIS — K219 Gastro-esophageal reflux disease without esophagitis: Secondary | ICD-10-CM | POA: Insufficient documentation

## 2023-02-08 DIAGNOSIS — I10 Essential (primary) hypertension: Secondary | ICD-10-CM | POA: Diagnosis not present

## 2023-02-08 DIAGNOSIS — Z923 Personal history of irradiation: Secondary | ICD-10-CM | POA: Diagnosis not present

## 2023-02-08 DIAGNOSIS — E785 Hyperlipidemia, unspecified: Secondary | ICD-10-CM | POA: Diagnosis not present

## 2023-02-08 DIAGNOSIS — R002 Palpitations: Secondary | ICD-10-CM | POA: Diagnosis not present

## 2023-02-08 DIAGNOSIS — M25561 Pain in right knee: Secondary | ICD-10-CM | POA: Insufficient documentation

## 2023-02-08 DIAGNOSIS — C7931 Secondary malignant neoplasm of brain: Secondary | ICD-10-CM | POA: Diagnosis present

## 2023-02-08 DIAGNOSIS — Z95828 Presence of other vascular implants and grafts: Secondary | ICD-10-CM

## 2023-02-08 DIAGNOSIS — M25562 Pain in left knee: Secondary | ICD-10-CM | POA: Diagnosis not present

## 2023-02-08 DIAGNOSIS — Z7951 Long term (current) use of inhaled steroids: Secondary | ICD-10-CM | POA: Diagnosis not present

## 2023-02-08 DIAGNOSIS — Z7982 Long term (current) use of aspirin: Secondary | ICD-10-CM | POA: Insufficient documentation

## 2023-02-08 DIAGNOSIS — R079 Chest pain, unspecified: Secondary | ICD-10-CM | POA: Diagnosis not present

## 2023-02-08 DIAGNOSIS — Z79899 Other long term (current) drug therapy: Secondary | ICD-10-CM | POA: Insufficient documentation

## 2023-02-08 DIAGNOSIS — C3431 Malignant neoplasm of lower lobe, right bronchus or lung: Secondary | ICD-10-CM | POA: Diagnosis not present

## 2023-02-08 DIAGNOSIS — K589 Irritable bowel syndrome without diarrhea: Secondary | ICD-10-CM | POA: Insufficient documentation

## 2023-02-08 LAB — CBC WITH DIFFERENTIAL (CANCER CENTER ONLY)
Abs Immature Granulocytes: 0.07 10*3/uL (ref 0.00–0.07)
Basophils Absolute: 0.1 10*3/uL (ref 0.0–0.1)
Basophils Relative: 1 %
Eosinophils Absolute: 0.1 10*3/uL (ref 0.0–0.5)
Eosinophils Relative: 1 %
HCT: 33.1 % — ABNORMAL LOW (ref 39.0–52.0)
Hemoglobin: 10.5 g/dL — ABNORMAL LOW (ref 13.0–17.0)
Immature Granulocytes: 1 %
Lymphocytes Relative: 8 %
Lymphs Abs: 0.9 10*3/uL (ref 0.7–4.0)
MCH: 27.6 pg (ref 26.0–34.0)
MCHC: 31.7 g/dL (ref 30.0–36.0)
MCV: 86.9 fL (ref 80.0–100.0)
Monocytes Absolute: 0.8 10*3/uL (ref 0.1–1.0)
Monocytes Relative: 8 %
Neutro Abs: 9.2 10*3/uL — ABNORMAL HIGH (ref 1.7–7.7)
Neutrophils Relative %: 81 %
Platelet Count: 343 10*3/uL (ref 150–400)
RBC: 3.81 MIL/uL — ABNORMAL LOW (ref 4.22–5.81)
RDW: 16.5 % — ABNORMAL HIGH (ref 11.5–15.5)
WBC Count: 11.1 10*3/uL — ABNORMAL HIGH (ref 4.0–10.5)
nRBC: 0 % (ref 0.0–0.2)

## 2023-02-08 LAB — CMP (CANCER CENTER ONLY)
ALT: 17 U/L (ref 0–44)
AST: 15 U/L (ref 15–41)
Albumin: 3.6 g/dL (ref 3.5–5.0)
Alkaline Phosphatase: 151 U/L — ABNORMAL HIGH (ref 38–126)
Anion gap: 9 (ref 5–15)
BUN: 7 mg/dL — ABNORMAL LOW (ref 8–23)
CO2: 24 mmol/L (ref 22–32)
Calcium: 8.3 mg/dL — ABNORMAL LOW (ref 8.9–10.3)
Chloride: 101 mmol/L (ref 98–111)
Creatinine: 0.83 mg/dL (ref 0.61–1.24)
GFR, Estimated: >60 mL/min (ref >60)
Glucose, Bld: 124 mg/dL — ABNORMAL HIGH (ref 70–99)
Potassium: 4 mmol/L (ref 3.5–5.1)
Sodium: 134 mmol/L — ABNORMAL LOW (ref 135–145)
Total Bilirubin: 0.7 mg/dL (ref 0.3–1.2)
Total Protein: 6.6 g/dL (ref 6.5–8.1)

## 2023-02-08 MED ORDER — SODIUM CHLORIDE 0.9% FLUSH
10.0000 mL | INTRAVENOUS | Status: DC | PRN
  Administered 2023-02-08: 10 mL

## 2023-02-08 MED ORDER — SODIUM CHLORIDE 0.9 % IV SOLN: Freq: Once | INTRAVENOUS | Status: AC

## 2023-02-08 MED ORDER — HEPARIN SOD (PORK) LOCK FLUSH 100 UNIT/ML IV SOLN
500.0000 [IU] | Freq: Once | INTRAVENOUS | Status: AC | PRN
  Administered 2023-02-08: 500 [IU]

## 2023-02-08 MED ORDER — SODIUM CHLORIDE 0.9 % IV SOLN
200.0000 mg | Freq: Once | INTRAVENOUS | Status: AC
  Administered 2023-02-08: 200 mg via INTRAVENOUS
  Filled 2023-02-08: qty 200

## 2023-02-08 NOTE — Patient Instructions (Signed)

## 2023-02-08 NOTE — Progress Notes (Signed)
[facility] [phone]   [phone]  OFFICE PROGRESS NOTE  [name], NP [name] [place] [address]  DIAGNOSIS: Stage IV (T3, N0, M1C) non-small cell lung cancer, adenocarcinoma.  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 He was diagnosed in July 2021.   Molecular Biomarkers:  MSI-High DETECTED Pembrolizumab Atezolizumab, Avelumab, Cemiplimab, Dostarlimab, Durvalumab, Ipilimumab, Nivolumab   STK11Splice Site SNV 1.9% Everolimus, Temsirolimus Yes   [id number] 1.7% Binimetinib Yes   [id number] 0.4%   Niraparib, Olaparib, Rucaparib, Talazoparib, Tazemetostat Yes   PRIOR THERAPY:  1) SRS to the solitary brain metastasis under the care of Dr. [name]. Last treatment 11/14/19. 2) Weekly concurrent chemoradiation with carboplatin for an AUC of 2, paclitaxel 45 mg/m2.  First dose expected on 11/25/2019. Status post 7 cycles, last dose was given 01/06/2020 with partial response.  3)  Immunotherapy with Keytruda 200 mg IV every 3 weeks.  First dose February 10, 2020 for a patient with MSI high.  Status post 35  cycles. 4) Avastin 15 mg/KG every 3 weeks.  First dose today for the vasogenic edema of the brain.S/P 7 cycles. 5) The patient had evidence for local disease recurrence in July 2024.   CURRENT THERAPY: Resuming his treatment with Keytruda 200 Mg IV every 3 weeks.  First dose 11/16/2022.  Status post 2 cycles.  INTERVAL HISTORY: Joseph Atkins 62 y.o. male returns to the clinic today for follow-up visit accompanied by his wife.Discussed the use of AI scribe software for clinical note transcription with the patient, who gave verbal consent to proceed.  History of Present Illness   Joseph Atkins, a 62 year old individual with a history of stage 4 non-small cell lung adenocarcinoma (adenocarcinoma), was diagnosed in July 2021.  The tumor was identified as MSI high, a marker for immunotherapy. Following brain radiation and resection for a brain lesion, he was placed on Keytruda for two years. After a period of observation, the tumor began to regrow, prompting a resumption of Keytruda treatment in July 2024. He has received three cycles of this treatment thus far.  Recently, he has been experiencing a persistent cough, which he describes as not being well-controlled despite taking Benzonatate three times a day. Accompanying this cough are episodes of palpitations, with a heart rate reaching up to 150 beats per minute during these episodes. These episodes are sporadic and seem to be triggered by severe bouts of coughing. He denies any history of atrial fibrillation or irregular heartbeat and has not previously seen a cardiologist.  In addition to these symptoms, he reports swelling in his right arm and right leg, which appears to be persistent throughout the day. He also complains of bilateral knee pain, which he attributes to changes in the weather. He has noticed that his symptoms seem to worsen with weather changes, affecting areas where he has had previous injuries.  He also reports occasional chest pain, particularly during severe coughing episodes. He has been on a reduced dose of Decadron (1mg ), a steroid medication. Despite these symptoms and upcoming treatment, he has elected to continue with his current immunotherapy regimen.       MEDICAL HISTORY: Past Medical History:  Diagnosis Date   Cancer [facility])    lung cancer   Diverticulosis    GERD (gastroesophageal reflux disease)    Hx of small bowel obstruction    Hyperlipidemia  Hypertension    Hypothyroidism    IBS (irritable bowel syndrome)    Substance abuse (HCC)    Alcoholic, Drug addition   Thyroid disease     ALLERGIES:  is allergic to penicillins.  MEDICATIONS:  Current Outpatient Medications  Medication Sig Dispense Refill   albuterol (PROVENTIL  HFA;VENTOLIN HFA) 108 (90 Base) MCG/ACT inhaler Inhale 2 puffs into the lungs every 6 (six) hours as needed for wheezing or shortness of breath. 1 Inhaler 0   amLODipine (NORVASC) 5 MG tablet TAKE 1 TABLET (5 MG TOTAL) BY MOUTH DAILY. 90 tablet 1   ANORO ELLIPTA 62.5-25 MCG/INH AEPB 1 puff daily.     augmented betamethasone dipropionate (DIPROLENE-AF) 0.05 % cream Apply topically 2 (two) times daily.     benzonatate (TESSALON) 100 MG capsule Take 1 capsule (100 mg total) by mouth 3 (three) times daily as needed. 30 capsule 2   [facility] ASPIRIN LOW DOSE 81 MG tablet TAKE 1 TABLET (81 MG TOTAL) BY MOUTH DAILY. SWALLOW WHOLE. 90 tablet 3   cyclobenzaprine (FLEXERIL) 10 MG tablet Take 10 mg by mouth 2 (two) times daily as needed for muscle spasms.     dexamethasone (DECADRON) 1 MG tablet Take 2 tablets (2 mg total) by mouth daily with breakfast. 60 tablet 1   docusate sodium (COLACE) 100 MG capsule Take 100 mg by mouth daily.     DULoxetine (CYMBALTA) 20 MG capsule Take 1 capsule (20 mg total) by mouth 2 (two) times daily. 60 capsule 5   fluticasone furoate-vilanterol (BREO ELLIPTA) 100-25 MCG/INH AEPB Inhale 1 puff into the lungs daily. 30 each 3   hydrOXYzine (ATARAX) 25 MG tablet Take 25 mg by mouth at bedtime.     levothyroxine (SYNTHROID) 100 MCG tablet TAKE 1 TABLET BY MOUTH EVERY DAY 90 tablet 1   lidocaine-prilocaine (EMLA) cream Apply 1 application topically as needed. 30 g 0   omeprazole (PRILOSEC) 40 MG capsule Take 1 capsule (40 mg total) by mouth daily. 90 capsule 4   ondansetron (ZOFRAN) 4 MG tablet Take 1 tablet (4 mg total) by mouth every 8 (eight) hours as needed. 40 tablet 2   oxyCODONE-acetaminophen (PERCOCET/ROXICET) 5-325 MG tablet Take 1 tablet by mouth every 4 (four) hours as needed for severe pain. 30 tablet 0   pembrolizumab (KEYTRUDA) 100 MG/4ML SOLN See admin instructions.     polyethylene glycol powder (MIRALAX) powder Take 17 g by mouth daily. 255 g 11   prochlorperazine  (COMPAZINE) 10 MG tablet Take 1 tablet (10 mg total) by mouth every 6 (six) hours as needed. 30 tablet 2   temazepam (RESTORIL) 30 MG capsule Take 1 capsule (30 mg total) by mouth at bedtime as needed for sleep. 30 capsule 0   triamcinolone cream (KENALOG) 0.1 % APPLY TOPICALLY 2 TIMES DAILY AS NEEDED. 454 g 0   TRULANCE 3 MG TABS Take 1 tablet by mouth daily.     No current facility-administered medications for this visit.    SURGICAL HISTORY:  Past Surgical History:  Procedure Laterality Date   arm surgery Right    BRONCHIAL BRUSHINGS  10/24/2019   Procedure: BRONCHIAL BRUSHINGS;  Surgeon: Leslye Peer, MD;  Location: [facility] ENDOSCOPY;  Service: Cardiopulmonary;;  right lower lobe    BRONCHIAL BRUSHINGS  11/05/2019   Procedure: BRONCHIAL BRUSHINGS;  Surgeon: Leslye Peer, MD;  Location: [facility] ENDOSCOPY;  Service: Pulmonary;;   BRONCHIAL NEEDLE ASPIRATION BIOPSY  10/24/2019   Procedure: BRONCHIAL NEEDLE ASPIRATION BIOPSIES;  Surgeon: Levy Pupa  S, MD;  Location: [facility] ENDOSCOPY;  Service: Cardiopulmonary;;   BRONCHIAL NEEDLE ASPIRATION BIOPSY  11/05/2019   Procedure: BRONCHIAL NEEDLE ASPIRATION BIOPSIES;  Surgeon: Leslye Peer, MD;  Location: [facility] ENDOSCOPY;  Service: Pulmonary;;   ENDOBRONCHIAL ULTRASOUND N/A 10/24/2019   Procedure: ENDOBRONCHIAL ULTRASOUND;  Surgeon: Leslye Peer, MD;  Location: [facility] ENDOSCOPY;  Service: Cardiopulmonary;  Laterality: N/A;   FINGER SURGERY Right    Middle   IR IMAGING GUIDED PORT INSERTION  11/19/2019   VIDEO BRONCHOSCOPY N/A 10/24/2019   Procedure: VIDEO BRONCHOSCOPY WITHOUT FLUORO;  Surgeon: Leslye Peer, MD;  Location: [facility] ENDOSCOPY;  Service: Cardiopulmonary;  Laterality: N/A;   VIDEO BRONCHOSCOPY WITH ENDOBRONCHIAL NAVIGATION N/A 11/05/2019   Procedure: VIDEO BRONCHOSCOPY WITH ENDOBRONCHIAL NAVIGATION;  Surgeon: Leslye Peer, MD;  Location: [facility] ENDOSCOPY;  Service: Pulmonary;  Laterality: N/A;    REVIEW OF SYSTEMS:  Constitutional: positive for  fatigue Eyes: negative Ears, nose, mouth, throat, and face: negative Respiratory: positive for cough and dyspnea on exertion Cardiovascular: negative Gastrointestinal: negative Genitourinary:negative Integument/breast: negative Hematologic/lymphatic: negative Musculoskeletal:negative Neurological: negative Behavioral/Psych: negative Endocrine: negative Allergic/Immunologic: negative   PHYSICAL EXAMINATION: General appearance: alert, cooperative, fatigued, and no distress Head: Normocephalic, without obvious abnormality, atraumatic Neck: no adenopathy, no JVD, supple, symmetrical, trachea midline, and thyroid not enlarged, symmetric, no tenderness/mass/nodules Lymph nodes: Cervical, supraclavicular, and axillary nodes normal. Resp: clear to auscultation bilaterally Back: symmetric, no curvature. ROM normal. No CVA tenderness. Cardio: regular rate and rhythm, S1, S2 normal, no murmur, click, rub or gallop GI: soft, non-tender; bowel sounds normal; no masses,  no organomegaly Extremities: extremities normal, atraumatic, no cyanosis or edema Neurologic: Alert and oriented X 3, normal strength and tone. Normal symmetric reflexes. Normal coordination and gait  ECOG PERFORMANCE STATUS: 1 - Symptomatic but completely ambulatory  Blood pressure 120/69, pulse 99, temperature 97.7 F (36.5 C), temperature source Oral, resp. rate 17, height 5\' 8"  (1.727 m), weight 188 lb 6.4 oz (85.5 kg), SpO2 99%.  LABORATORY DATA: Lab Results  Component Value Date   WBC 11.1 (H) 02/08/2023   HGB 10.5 (L) 02/08/2023   HCT 33.1 (L) 02/08/2023   MCV 86.9 02/08/2023   PLT 343 02/08/2023      Chemistry      Component Value Date/Time   NA 131 (L) 01/18/2023 0836   NA 141 03/08/2017 1707   K 4.0 01/18/2023 0836   CL 100 01/18/2023 0836   CO2 24 01/18/2023 0836   BUN 6 (L) 01/18/2023 0836   BUN 12 03/08/2017 1707   CREATININE 1.02 01/18/2023 0836      Component Value Date/Time   CALCIUM 8.2 (L)  01/18/2023 0836   ALKPHOS 129 (H) 01/18/2023 0836   AST 17 01/18/2023 0836   ALT 17 01/18/2023 0836   BILITOT 0.7 01/18/2023 0836       RADIOGRAPHIC STUDIES: No results found.   ASSESSMENT AND PLAN: This is a very pleasant 62 years old white male with stage IV (T3, N0, M1c) non-small cell lung cancer, adenocarcinoma with MSI high presented with right lower lobe/infrahilar mass in addition to solitary brain metastasis in the left cerebellum diagnosed in July 2021. He is status post SRS to the solitary brain metastasis. The patient completed a course of concurrent chemoradiation with weekly carboplatin and paclitaxel.  He tolerated the treatment well except for fatigue and mild odynophagia. The patient has MSI high and I recommended for him treatment with immunotherapy with single agent Keytruda 200 mg IV every 3 weeks for a  total of 2 years unless the patient has unacceptable toxicity or disease progression. He is status post 35  cycles of treatment with Keytruda.  He also received 7 cycles of Avastin for the vasogenic edema in the brain.   Avastin will be on hold for now unless needed in the future. The patient tolerated his previous treatment with immunotherapy fairly well. The patient has been on observation and he is feeling fine with no concerning complaints except for the mild cough and wheezing. His restaging scan in June 2024 showed no concerning findings for disease progression except for interval increase in the adjacent postobstructive airspace disease close to the treated mass of the infra right lower lobe concerning for local disease recurrence.  He had a PET scan performed on 11/04/2022.  I personally and independently reviewed the scan images before the final report became available.  It showed increased radiotracer uptake associated with the masslike architectural distortion within the central aspect of the right lower lobe concerning for local disease recurrence.  But there was no  evidence for nodal metastasis or distant metastatic disease. I discussed the result with the patient and his wife.  The patient received curative radiotherapy to this area in the past and he may not be a great candidate for reirradiation.  He has MSI high and I recommended for him to consider resuming his treatment with Keytruda again at 200 Mg IV every 3 weeks.  He was also given the option of close monitoring and observation. The patient decided to resume his treatment with Keytruda 200 Mg IV every 3 weeks.  Status post 3 cycles. The patient has been tolerating this treatment fairly well. He had repeat CT scan of the chest, abdomen and pelvis performed few weeks ago and that showed enlargement of the central right lower lobe lung mass.    Stage 4 Non-Small Cell Lung Cancer (Adenocarcinoma) Tumor growth noted on recent imaging. Patient has been on Keytruda, with a brief period of observation, and has recently restarted treatment. Noted to have MSI-high marker, indicating potential for immunotherapy response. -Continue Keytruda. -Refer to radiation oncology for consideration of radiation to right hilar area.  Cough Persistent and severe, potentially related to tumor growth. Current management with Benzonatate not fully effective. -Continue Benzonatate as prescribed. -Consider potential for symptom improvement following planned radiation therapy.  Right-sided swelling (arm) Likely related to tumor growth and potential compression of the superior vena cava. -Monitor closely, potential for improvement following planned radiation therapy.  Knee Pain Bilateral, potentially related to weather changes. -Continue current management, no new interventions discussed.  Palpitations Episodes of rapid heart rate noted, with one episode of heart rate up to 150 bpm. No known history of atrial fibrillation. -Consider referral to cardiology if episodes persist for Holter monitor evaluation.  Steroid  Use Currently on 1mg  of Decadron (Dexamethasone). -Continue current dose.  Follow-up in 3 weeks, with ongoing Keytruda treatments as scheduled.   The patient was advised to call immediately if he has any concerning symptoms in the interval. The patient voices understanding of current disease status and treatment options and is in agreement with the current care plan.  All questions were answered. The patient knows to call the clinic with any problems, questions or concerns. We can certainly see the patient much sooner if necessary. The total time spent in the appointment was 30 minutes.  Disclaimer: This note was dictated with voice recognition software. Similar sounding words can inadvertently be transcribed and may not be corrected upon review.

## 2023-02-15 ENCOUNTER — Ambulatory Visit
Admission: RE | Admit: 2023-02-15 | Payer: Managed Care, Other (non HMO) | Source: Ambulatory Visit | Admitting: Radiation Oncology

## 2023-02-15 DIAGNOSIS — Z510 Encounter for antineoplastic radiation therapy: Secondary | ICD-10-CM | POA: Diagnosis not present

## 2023-02-16 ENCOUNTER — Ambulatory Visit: Payer: Managed Care, Other (non HMO)

## 2023-02-17 ENCOUNTER — Ambulatory Visit: Payer: Managed Care, Other (non HMO)

## 2023-02-20 ENCOUNTER — Other Ambulatory Visit: Payer: Self-pay

## 2023-02-20 ENCOUNTER — Ambulatory Visit: Payer: Managed Care, Other (non HMO)

## 2023-02-20 ENCOUNTER — Ambulatory Visit
Admission: RE | Admit: 2023-02-20 | Discharge: 2023-02-20 | Disposition: A | Discharge status: Home / Self Care | Payer: Managed Care, Other (non HMO) | Source: Ambulatory Visit | Attending: Radiation Oncology | Admitting: Radiation Oncology

## 2023-02-20 DIAGNOSIS — Z510 Encounter for antineoplastic radiation therapy: Secondary | ICD-10-CM | POA: Diagnosis not present

## 2023-02-20 LAB — RAD ONC ARIA SESSION SUMMARY
Course Elapsed Days: 0
Plan Fractions Treated to Date: 1
Plan Prescribed Dose Per Fraction: 5 Gy
Plan Total Fractions Prescribed: 10
Plan Total Prescribed Dose: 50 Gy
Reference Point Dosage Given to Date: 5 Gy
Reference Point Session Dosage Given: 5 Gy
Session Number: 1

## 2023-02-21 ENCOUNTER — Inpatient Hospital Stay
Admission: RE | Admit: 2023-02-21 | Discharge: 2023-02-21 | Disposition: A | Discharge status: Home / Self Care | Payer: Self-pay | Source: Ambulatory Visit | Attending: Radiation Oncology

## 2023-02-21 ENCOUNTER — Other Ambulatory Visit: Payer: Self-pay

## 2023-02-21 DIAGNOSIS — Z510 Encounter for antineoplastic radiation therapy: Secondary | ICD-10-CM | POA: Diagnosis not present

## 2023-02-21 LAB — RAD ONC ARIA SESSION SUMMARY
Course Elapsed Days: 1
Plan Fractions Treated to Date: 2
Plan Prescribed Dose Per Fraction: 5 Gy
Plan Total Fractions Prescribed: 10
Plan Total Prescribed Dose: 50 Gy
Reference Point Dosage Given to Date: 10 Gy
Reference Point Session Dosage Given: 5 Gy
Session Number: 2

## 2023-02-22 ENCOUNTER — Other Ambulatory Visit: Payer: Self-pay

## 2023-02-22 ENCOUNTER — Ambulatory Visit
Admission: RE | Admit: 2023-02-22 | Discharge: 2023-02-22 | Disposition: A | Discharge status: Home / Self Care | Payer: Managed Care, Other (non HMO) | Source: Ambulatory Visit | Attending: Internal Medicine

## 2023-02-22 ENCOUNTER — Ambulatory Visit
Admission: RE | Admit: 2023-02-22 | Discharge: 2023-02-22 | Disposition: A | Discharge status: Home / Self Care | Payer: Managed Care, Other (non HMO) | Source: Ambulatory Visit | Attending: Radiation Oncology | Admitting: Radiation Oncology

## 2023-02-22 DIAGNOSIS — C7931 Secondary malignant neoplasm of brain: Secondary | ICD-10-CM

## 2023-02-22 DIAGNOSIS — Z510 Encounter for antineoplastic radiation therapy: Secondary | ICD-10-CM | POA: Diagnosis not present

## 2023-02-22 LAB — RAD ONC ARIA SESSION SUMMARY
Course Elapsed Days: 2
Plan Fractions Treated to Date: 3
Plan Prescribed Dose Per Fraction: 5 Gy
Plan Total Fractions Prescribed: 10
Plan Total Prescribed Dose: 50 Gy
Reference Point Dosage Given to Date: 15 Gy
Reference Point Session Dosage Given: 5 Gy
Session Number: 3

## 2023-02-22 MED ORDER — SODIUM CHLORIDE 0.9% FLUSH
10.0000 mL | INTRAVENOUS | Status: DC | PRN
  Administered 2023-02-22: 10 mL via INTRAVENOUS

## 2023-02-22 MED ORDER — GADOPICLENOL 0.5 MMOL/ML IV SOLN
9.0000 mL | Freq: Once | INTRAVENOUS | Status: AC | PRN
  Administered 2023-02-22: 9 mL via INTRAVENOUS

## 2023-02-22 MED ORDER — HEPARIN SOD (PORK) LOCK FLUSH 100 UNIT/ML IV SOLN
500.0000 [IU] | Freq: Once | INTRAVENOUS | Status: AC
  Administered 2023-02-22: 500 [IU] via INTRAVENOUS

## 2023-02-23 ENCOUNTER — Ambulatory Visit
Admission: RE | Admit: 2023-02-23 | Discharge: 2023-02-23 | Disposition: A | Discharge status: Home / Self Care | Payer: Managed Care, Other (non HMO) | Source: Ambulatory Visit | Attending: Radiation Oncology | Admitting: Radiation Oncology

## 2023-02-23 ENCOUNTER — Other Ambulatory Visit: Payer: Self-pay

## 2023-02-23 DIAGNOSIS — Z510 Encounter for antineoplastic radiation therapy: Secondary | ICD-10-CM | POA: Diagnosis not present

## 2023-02-23 LAB — RAD ONC ARIA SESSION SUMMARY
Course Elapsed Days: 3
Plan Fractions Treated to Date: 4
Plan Prescribed Dose Per Fraction: 5 Gy
Plan Total Fractions Prescribed: 10
Plan Total Prescribed Dose: 50 Gy
Reference Point Dosage Given to Date: 20 Gy
Reference Point Session Dosage Given: 5 Gy
Session Number: 4

## 2023-02-24 ENCOUNTER — Other Ambulatory Visit: Payer: Self-pay | Admitting: Radiation Therapy

## 2023-02-24 ENCOUNTER — Ambulatory Visit
Admission: RE | Admit: 2023-02-24 | Discharge: 2023-02-24 | Disposition: A | Discharge status: Home / Self Care | Payer: Managed Care, Other (non HMO) | Source: Ambulatory Visit | Attending: Radiation Oncology | Admitting: Radiation Oncology

## 2023-02-24 ENCOUNTER — Other Ambulatory Visit: Payer: Self-pay

## 2023-02-24 ENCOUNTER — Ambulatory Visit
Admission: RE | Admit: 2023-02-24 | Discharge: 2023-02-24 | Disposition: A | Discharge status: Home / Self Care | Payer: Managed Care, Other (non HMO) | Source: Ambulatory Visit | Attending: Radiation Oncology

## 2023-02-24 DIAGNOSIS — Z7982 Long term (current) use of aspirin: Secondary | ICD-10-CM | POA: Diagnosis not present

## 2023-02-24 DIAGNOSIS — E785 Hyperlipidemia, unspecified: Secondary | ICD-10-CM | POA: Diagnosis not present

## 2023-02-24 DIAGNOSIS — Z510 Encounter for antineoplastic radiation therapy: Secondary | ICD-10-CM | POA: Insufficient documentation

## 2023-02-24 DIAGNOSIS — Z801 Family history of malignant neoplasm of trachea, bronchus and lung: Secondary | ICD-10-CM | POA: Diagnosis not present

## 2023-02-24 DIAGNOSIS — C3431 Malignant neoplasm of lower lobe, right bronchus or lung: Secondary | ICD-10-CM | POA: Insufficient documentation

## 2023-02-24 DIAGNOSIS — Z79899 Other long term (current) drug therapy: Secondary | ICD-10-CM | POA: Diagnosis not present

## 2023-02-24 DIAGNOSIS — C3411 Malignant neoplasm of upper lobe, right bronchus or lung: Secondary | ICD-10-CM | POA: Diagnosis not present

## 2023-02-24 DIAGNOSIS — Z87891 Personal history of nicotine dependence: Secondary | ICD-10-CM | POA: Diagnosis not present

## 2023-02-24 DIAGNOSIS — C7931 Secondary malignant neoplasm of brain: Secondary | ICD-10-CM | POA: Insufficient documentation

## 2023-02-24 DIAGNOSIS — Z79890 Hormone replacement therapy (postmenopausal): Secondary | ICD-10-CM | POA: Diagnosis not present

## 2023-02-24 DIAGNOSIS — K219 Gastro-esophageal reflux disease without esophagitis: Secondary | ICD-10-CM | POA: Diagnosis not present

## 2023-02-24 DIAGNOSIS — Z5112 Encounter for antineoplastic immunotherapy: Secondary | ICD-10-CM | POA: Diagnosis not present

## 2023-02-24 DIAGNOSIS — I10 Essential (primary) hypertension: Secondary | ICD-10-CM | POA: Diagnosis not present

## 2023-02-24 DIAGNOSIS — E039 Hypothyroidism, unspecified: Secondary | ICD-10-CM | POA: Diagnosis not present

## 2023-02-24 DIAGNOSIS — Z7952 Long term (current) use of systemic steroids: Secondary | ICD-10-CM | POA: Diagnosis not present

## 2023-02-24 DIAGNOSIS — K589 Irritable bowel syndrome without diarrhea: Secondary | ICD-10-CM | POA: Diagnosis not present

## 2023-02-24 LAB — RAD ONC ARIA SESSION SUMMARY
Course Elapsed Days: 4
Plan Fractions Treated to Date: 5
Plan Prescribed Dose Per Fraction: 5 Gy
Plan Total Fractions Prescribed: 10
Plan Total Prescribed Dose: 50 Gy
Reference Point Dosage Given to Date: 25 Gy
Reference Point Session Dosage Given: 5 Gy
Session Number: 5

## 2023-02-26 NOTE — Progress Notes (Unsigned)
[facility] OFFICE PROGRESS NOTE  [name], NP [address] [name] [address] [place] [address]  DIAGNOSIS: Stage IV (T3, N0, M1C) non-small cell lung cancer, adenocarcinoma.  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 He was diagnosed in July 2021.   Molecular Biomarkers:  MSI-High DETECTED Pembrolizumab Atezolizumab, Avelumab, Cemiplimab, Dostarlimab, Durvalumab, Ipilimumab, Nivolumab   STK11Splice Site SNV 1.9% Everolimus, Temsirolimus Yes   KRASG12D 1.7% Binimetinib Yes   [id number] 0.4%   Niraparib, Olaparib, Rucaparib, Talazoparib, Tazemetostat Yes  PRIOR THERAPY: 1) SRS to the solitary brain metastasis under the care of Dr. [name]. Last treatment 11/14/19. 2) Weekly concurrent chemoradiation with carboplatin for an AUC of 2, paclitaxel 45 mg/m2.  First dose expected on 11/25/2019. Status post 7 cycles, last dose was given 01/06/2020 with partial response.  3)  Immunotherapy with Keytruda 200 mg IV every 3 weeks.  First dose February 10, 2020 for a patient with MSI high.  Status post 35  cycles. 4) Avastin 15 mg/KG every 3 weeks.  First dose today for the vasogenic edema of the brain.S/P 7 cycles. 5) The patient had evidence for local disease recurrence in July 2024. 6) SRS to the occipital brain lesion under the care of Dr. [name] on 12/22/22  CURRENT THERAPY: 1) Resuming his treatment with Keytruda 200 Mg IV every 3 weeks. First dose 11/16/2022. Status post 5 cycles.  2) UHRT under the care of Dr. [name], last dose expected on 03/03/23 3)Planning on starting SRS to the new metastatic brain lesions next week  INTERVAL HISTORY: Joseph Atkins 62 y.o. male returns to the clinic today for a follow up visit accompanied by his wife. The patient recently had evidence of disease progression with increased activity of the central aspect of the right lower lobe concerning for  local disease recurrence. Therefore, he resumed immunotherapy with [name]. He tolerates this well without any adverse side effects except in the past he has had pruritic nodular lesions from [facility] for which he would use kenalog cream and zyrtec. He had a restaging CT scan at his last appointment which showed enlarging lung lesion. Therefore, he was re-referred to radiation oncology and he is currently undergoing UHRT. His last day is expected on 03/03/23.   He was given Tessalon for cough at his last appointment which did not help.  He also tried Robitussin which was not effective.  He was sent a prescription for Hycodan a few days ago.  He only took 1 dose so he is not sure if it is effective at this time.  He reports no change in his cough severity since he was last seen a month ago.  He manages this with his inhaler.  Sometimes if he coughs it can cause emesis without any nausea.  Denies any associated fevers, chills, night sweats, chest pain, hemoptysis, or signs of infection. They also take omeprazole daily for a long-standing history of acid reflux, which they do not believe is contributing to their cough. Denies changes in his shortness of breath unless he has shortness of breath with the coughing spell.    He is followed closely by Dr. Barbaraann Cao for his metastatic disease to the brain. The patient is currently on decadron with 1 mg daily. He has some intermittent swelling as a side effect of his steroid.   Denies any fever, night sweats (besides night sweats secondary to his electric blanket), or unexplained weight  loss.  Denies any diarrhea or constipation.  Denies any new rashes or skin changes.  He is here today for evaluation repeat blood work before undergoing cycle #6.   MEDICAL HISTORY: Past Medical History:  Diagnosis Date   Cancer (HCC)    lung cancer   Diverticulosis    GERD (gastroesophageal reflux disease)    Hx of small bowel obstruction    Hyperlipidemia    Hypertension     Hypothyroidism    IBS (irritable bowel syndrome)    Substance abuse (HCC)    Alcoholic, Drug addition   Thyroid disease     ALLERGIES:  is allergic to penicillins.  MEDICATIONS:  Current Outpatient Medications  Medication Sig Dispense Refill   albuterol (PROVENTIL HFA;VENTOLIN HFA) 108 (90 Base) MCG/ACT inhaler Inhale 2 puffs into the lungs every 6 (six) hours as needed for wheezing or shortness of breath. 1 Inhaler 0   amLODipine (NORVASC) 5 MG tablet TAKE 1 TABLET (5 MG TOTAL) BY MOUTH DAILY. 90 tablet 1   ANORO ELLIPTA 62.5-25 MCG/INH AEPB 1 puff daily.     augmented betamethasone dipropionate (DIPROLENE-AF) 0.05 % cream Apply topically 2 (two) times daily.     benzonatate (TESSALON) 100 MG capsule Take 1 capsule (100 mg total) by mouth 3 (three) times daily as needed. 30 capsule 2   [employer] ASPIRIN LOW DOSE 81 MG tablet TAKE 1 TABLET (81 MG TOTAL) BY MOUTH DAILY. SWALLOW WHOLE. 90 tablet 3   cyclobenzaprine (FLEXERIL) 10 MG tablet Take 10 mg by mouth 2 (two) times daily as needed for muscle spasms.     dexamethasone (DECADRON) 1 MG tablet Take 1 tablet (1 mg total) by mouth daily with breakfast. 60 tablet 1   docusate sodium (COLACE) 100 MG capsule Take 100 mg by mouth daily.     DULoxetine (CYMBALTA) 20 MG capsule Take 1 capsule (20 mg total) by mouth 2 (two) times daily. 60 capsule 5   fluticasone furoate-vilanterol (BREO ELLIPTA) 100-25 MCG/INH AEPB Inhale 1 puff into the lungs daily. 30 each 3   HYDROcodone bit-homatropine (HYCODAN) 5-1.5 MG/5ML syrup Take 5 mLs by mouth every 6 (six) hours as needed for cough. 120 mL 0   hydrOXYzine (ATARAX) 25 MG tablet Take 25 mg by mouth at bedtime.     levothyroxine (SYNTHROID) 100 MCG tablet TAKE 1 TABLET BY MOUTH EVERY DAY 90 tablet 1   lidocaine-prilocaine (EMLA) cream Apply 1 application topically as needed. 30 g 0   omeprazole (PRILOSEC) 40 MG capsule Take 1 capsule (40 mg total) by mouth daily. 90 capsule 4   ondansetron (ZOFRAN) 4 MG  tablet Take 1 tablet (4 mg total) by mouth every 8 (eight) hours as needed. 40 tablet 2   oxyCODONE-acetaminophen (PERCOCET/ROXICET) 5-325 MG tablet Take 1 tablet by mouth every 4 (four) hours as needed for severe pain. 30 tablet 0   pembrolizumab (KEYTRUDA) 100 MG/4ML SOLN See admin instructions.     polyethylene glycol powder (MIRALAX) powder Take 17 g by mouth daily. 255 g 11   prochlorperazine (COMPAZINE) 10 MG tablet Take 1 tablet (10 mg total) by mouth every 6 (six) hours as needed. 30 tablet 2   temazepam (RESTORIL) 30 MG capsule Take 1 capsule (30 mg total) by mouth at bedtime as needed for sleep. 30 capsule 0   triamcinolone cream (KENALOG) 0.1 % APPLY TOPICALLY 2 TIMES DAILY AS NEEDED. 454 g 0   TRULANCE 3 MG TABS Take 1 tablet by mouth daily.     No  current facility-administered medications for this visit.    SURGICAL HISTORY:  Past Surgical History:  Procedure Laterality Date   arm surgery Right    BRONCHIAL BRUSHINGS  10/24/2019   Procedure: BRONCHIAL BRUSHINGS;  Surgeon: Leslye Peer, MD;  Location[facility] ENDOSCOPY;  Service: Cardiopulmonary;;  right lower lobe    BRONCHIAL BRUSHINGS  11/05/2019   Procedure: BRONCHIAL BRUSHINGS;  Surgeon: Leslye Peer, MD;  Location: [facility] ENDOSCOPY;  Service: Pulmonary;;   BRONCHIAL NEEDLE ASPIRATION BIOPSY  10/24/2019   Procedure: BRONCHIAL NEEDLE ASPIRATION BIOPSIES;  Surgeon: Leslye Peer, MD;  Location: [facility] ENDOSCOPY;  Service: Cardiopulmonary;;   BRONCHIAL NEEDLE ASPIRATION BIOPSY  11/05/2019   Procedure: BRONCHIAL NEEDLE ASPIRATION BIOPSIES;  Surgeon: Leslye Peer, MD;  Location[facility] ENDOSCOPY;  Service: Pulmonary;;   ENDOBRONCHIAL ULTRASOUND N/A 10/24/2019   Procedure: ENDOBRONCHIAL ULTRASOUND;  Surgeon: Leslye Peer, MD;  Location: [facility] ENDOSCOPY;  Service: Cardiopulmonary;  Laterality: N/A;   FINGER SURGERY Right    Middle   IR IMAGING GUIDED PORT INSERTION  11/19/2019   VIDEO BRONCHOSCOPY N/A 10/24/2019   Procedure: VIDEO  BRONCHOSCOPY WITHOUT FLUORO;  Surgeon: Leslye Peer, MD;  Location: [facility] ENDOSCOPY;  Service: Cardiopulmonary;  Laterality: N/A;   VIDEO BRONCHOSCOPY WITH ENDOBRONCHIAL NAVIGATION N/A 11/05/2019   Procedure: VIDEO BRONCHOSCOPY WITH ENDOBRONCHIAL NAVIGATION;  Surgeon: Leslye Peer, MD;  Location: [facility] ENDOSCOPY;  Service: Pulmonary;  Laterality: N/A;    REVIEW OF SYSTEMS:   Review of Systems  Constitutional: Positive for fatigue.  Negative for appetite change, chills, fever and unexpected weight change.  HENT: Negative for mouth sores, nosebleeds, sore throat and trouble swallowing.   Eyes: Positive for vision changes.  Negative for eye problems and icterus.  Respiratory: Shortness of breath with exertion and increased cough.  Negative for hemoptysis, and wheezing.   Cardiovascular: Positive for intermittent right lower extremity swelling and right arm swelling.  Negative for chest pain. Gastrointestinal: Negative for abdominal pain, constipation, diarrhea, nausea and vomiting.  Genitourinary: Negative for bladder incontinence, difficulty urinating, dysuria, frequency and hematuria.   Musculoskeletal: Negative for back pain, gait problem, neck pain and neck stiffness.  Skin: Positive for occasional rashes and itching.  Neurological: Positive for stable balance changes and gait disturbances.  Negative for extremity weakness, headaches, light-headedness and seizures.  Hematological: Negative for adenopathy. Does not bruise/bleed easily.  Psychiatric/Behavioral: Negative for confusion, depression and sleep disturbance. The patient is not nervous/anxious.      PHYSICAL EXAMINATION:  There were no vitals taken for this visit.  ECOG PERFORMANCE STATUS: 1 - Symptomatic but completely ambulatory  Physical Exam  Constitutional: Oriented to person, place, and time and well-developed, well-nourished, and in no distress.  HENT:  Head: Normocephalic and atraumatic.  Mouth/Throat: Oropharynx is clear  and moist. No oropharyngeal exudate.  Eyes: Conjunctivae are normal. Right eye exhibits no discharge. Left eye exhibits no discharge. No scleral icterus.  Neck: Normal range of motion. Neck supple.  Cardiovascular: Normal rate, regular rhythm, normal heart sounds and intact distal pulses.   Pulmonary/Chest: Effort normal and breath sounds normal. No respiratory distress. No wheezes. No rales.  Abdominal: Soft. Bowel sounds are normal. Exhibits no distension and no mass. There is no tenderness.  Musculoskeletal: Normal range of motion. Exhibits no edema.  Lymphadenopathy:    No cervical adenopathy.  Neurological: Alert and oriented to person, place, and time. Exhibits normal muscle tone.  This is a cane for ambulation. Skin: Skin is warm and dry. Not diaphoretic. No erythema. No pallor.  Psychiatric: Mood, memory and judgment normal.  Vitals reviewed.  LABORATORY DATA: Lab Results  Component Value Date   WBC 8.3 03/02/2023   HGB 10.4 (L) 03/02/2023   HCT 33.1 (L) 03/02/2023   MCV 87.6 03/02/2023   PLT 298 03/02/2023      Chemistry      Component Value Date/Time   NA 135 03/02/2023 0850   NA 141 03/08/2017 1707   K 3.5 03/02/2023 0850   CL 104 03/02/2023 0850   CO2 24 03/02/2023 0850   BUN 6 (L) 03/02/2023 0850   BUN 12 03/08/2017 1707   CREATININE 0.93 03/02/2023 0850      Component Value Date/Time   CALCIUM 8.4 (L) 03/02/2023 0850   ALKPHOS 115 03/02/2023 0850   AST 13 (L) 03/02/2023 0850   ALT 11 03/02/2023 0850   BILITOT 0.4 03/02/2023 0850       RADIOGRAPHIC STUDIES:  MR BRAIN W WO CONTRAST  Result Date: 02/22/2023 CLINICAL DATA:  Metastatic lung cancer status post SRS in 2021 and 2022. EXAM: MRI HEAD WITHOUT AND WITH CONTRAST TECHNIQUE: Multiplanar, multiecho pulse sequences of the brain and surrounding structures were obtained without and with intravenous contrast. CONTRAST:  9 cc [employer] COMPARISON:  Brain MRI 12/07/2022 FINDINGS: Brain: Numerous enhancing  lesions are again seen. Stable or smaller lesions: *2.6 cm x 1.9 cm x 2.0 lesion in the left cerebellar hemisphere with intralesional hemorrhage is stable to slightly decreased in size from 2.9 cm x 2.0 cm x 2.2 cm on the prior study. Surrounding FLAIR signal abnormality extending to the middle cerebellar peduncle is not significantly changed. *Punctate lesion in the medial right occipital lobe is unchanged (13-84). *A 4 mm lesion in the right occipital lobe more posteriorly and superiorly is unchanged (13-91). *Curvilinear enhancement measuring 4 mm in the medial left occipital lobe is not significantly changed (13-82). *Curvilinear enhancement in the left occipital lobe more posteriorly and superiorly measuring 6 mm is unchanged (13-97). *An ill-defined lesion measuring 3 mm in the right temporal lobe is unchanged (13-87). *A 6 mm lesion in the right precentral gyrus is not significantly changed (13-137). *A 7 mm lesion in the left precentral gyrus at the vertex is unchanged (13-150). New or enlarging lesions: *Punctate lesion in the right parietal lobe not definitely present on the prior study (13-112). *There are no other new or enlarging lesions. *There is no significant edema surrounding the supratentorial lesions. There is no acute intracranial hemorrhage, extra-axial fluid collection, or acute infarct. Parenchymal volume is stable. The ventricles are stable in size. Background chronic small-vessel ischemic change is stable. The pituitary and suprasellar region are normal. There is no mass effect or midline shift. Vascular: Normal flow voids. Skull and upper cervical spine: Normal marrow signal. Sinuses/Orbits: The paranasal sinuses are clear. The globes and orbits are unremarkable. Other: There is a trace left mastoid effusion. No obstructing nasopharyngeal mass is seen. IMPRESSION: 1. The largest enhancing lesion in the left cerebellar hemisphere is stable to slightly decreased in size since the study from  12/07/2022. Surrounding FLAIR signal abnormality is not significantly changed. 2. Single new punctate lesion in the right parietal lobe. 3. The numerous additional small enhancing lesions in the supratentorial brain detailed above are not significantly changed. Electronically Signed   By: Lesia Hausen M.D.   On: 02/22/2023 16:44     ASSESSMENT/PLAN:  This is a very pleasant 62 year old Caucasian male diagnosed with stage IV non-small cell lung cancer, adenocarcinoma.  The patient presented with a  right lower lobe/infrahilar mass as well as a solitary brain metastasis in the left cerebellum. He was diagnosed in July 2021. His molecular studies by [facility] show he has MSI high which is a good marker for response to immunotherapy which will be important for future treatment.     The patient will complete SRS to the solidary brain metastasis under the care of Dr. [name] later today on 11/14/19.    He then underwent a course of weekly concurrent chemoradiation with carboplatin for an AUC of 2 and paclitaxel 45 mg per metered squared.  He tolerated this treatment well except for fatigue and mild odynophagia.    The patient showed evidence of disease progression with an increase in volume of the right infrahilar mass.  Since the patient has MSI high, the patient then underwent treatment with single agent immunotherapy with Keytruda 200 mg IV every 3 weeks.   The patient is status post 35 cycles and completed his 2 years of treatment on 03/23/22.  He also received 7 cycles of Avastin for vasogenic edema in the brain.  Avastin has been on hold unless needed again in the future.     He has SRS to an occipital brain lesion on 12/22/22 by Dr. [name]   He had evidence of progression on his surveillance imaging from July 2024. Therefore, he resumed immunotherapy with Keytruda. He is status post 5 cycles.   His last scan showed some disease progression he is currently finishing up U HRT in the last day is tomorrow  on 03/03/2023.  He is also was found to have new metastatic disease to the brain he is expected to have SRS next week.  I checked with Dr. Arbutus Ped about his current radiation and his immunotherapy which is scheduled for today.  Dr. Arbutus Ped would recommend proceeding with treatment as planned today.    Labs were reviewed. Recommend that he proceed  with cycle #6 today as scheduled.    We will see him back for a follow up in 3 weeks for evaluation and repeat blood work before undergoing cycle #7.    The patient has Kenalog cream and will take Zyrtec if he has any trouble with itching with his immunotherapy as he has in the past.    Will continue taking Decadron as advised by Dr. Barbaraann Cao.  He is currently taking 1 mg twice daily and will decrease to 1 mg daily once his balance improves.   I sent a Z-Pak to the pharmacy for his cough should there be any postobstructive pneumonia component.  I also sent Hycodan for his cough a few days ago.  He is going to try this and let me know if he needs additional recommendations for cough and the other recommendation would be possible promethazine.   No orders of the defined types were placed in this encounter.    The total time spent in the appointment was 20-29 minutes  Minyon Billiter L Remonia Otte, PA-C 03/02/23

## 2023-02-27 ENCOUNTER — Ambulatory Visit
Admission: RE | Admit: 2023-02-27 | Discharge: 2023-02-27 | Disposition: A | Discharge status: Home / Self Care | Payer: Managed Care, Other (non HMO) | Source: Ambulatory Visit | Attending: Radiation Oncology

## 2023-02-27 ENCOUNTER — Other Ambulatory Visit: Payer: Self-pay

## 2023-02-27 ENCOUNTER — Inpatient Hospital Stay: Payer: Managed Care, Other (non HMO) | Attending: Internal Medicine

## 2023-02-27 DIAGNOSIS — K589 Irritable bowel syndrome without diarrhea: Secondary | ICD-10-CM | POA: Insufficient documentation

## 2023-02-27 DIAGNOSIS — E785 Hyperlipidemia, unspecified: Secondary | ICD-10-CM | POA: Insufficient documentation

## 2023-02-27 DIAGNOSIS — Z79899 Other long term (current) drug therapy: Secondary | ICD-10-CM | POA: Insufficient documentation

## 2023-02-27 DIAGNOSIS — E039 Hypothyroidism, unspecified: Secondary | ICD-10-CM | POA: Insufficient documentation

## 2023-02-27 DIAGNOSIS — Z510 Encounter for antineoplastic radiation therapy: Secondary | ICD-10-CM | POA: Insufficient documentation

## 2023-02-27 DIAGNOSIS — Z801 Family history of malignant neoplasm of trachea, bronchus and lung: Secondary | ICD-10-CM | POA: Insufficient documentation

## 2023-02-27 DIAGNOSIS — Z7952 Long term (current) use of systemic steroids: Secondary | ICD-10-CM | POA: Insufficient documentation

## 2023-02-27 DIAGNOSIS — I10 Essential (primary) hypertension: Secondary | ICD-10-CM | POA: Insufficient documentation

## 2023-02-27 DIAGNOSIS — K219 Gastro-esophageal reflux disease without esophagitis: Secondary | ICD-10-CM | POA: Insufficient documentation

## 2023-02-27 DIAGNOSIS — Z87891 Personal history of nicotine dependence: Secondary | ICD-10-CM | POA: Insufficient documentation

## 2023-02-27 DIAGNOSIS — Z5112 Encounter for antineoplastic immunotherapy: Secondary | ICD-10-CM | POA: Insufficient documentation

## 2023-02-27 DIAGNOSIS — Z79890 Hormone replacement therapy (postmenopausal): Secondary | ICD-10-CM | POA: Insufficient documentation

## 2023-02-27 DIAGNOSIS — C3431 Malignant neoplasm of lower lobe, right bronchus or lung: Secondary | ICD-10-CM | POA: Insufficient documentation

## 2023-02-27 DIAGNOSIS — C3411 Malignant neoplasm of upper lobe, right bronchus or lung: Secondary | ICD-10-CM | POA: Insufficient documentation

## 2023-02-27 DIAGNOSIS — Z7982 Long term (current) use of aspirin: Secondary | ICD-10-CM | POA: Insufficient documentation

## 2023-02-27 DIAGNOSIS — C7931 Secondary malignant neoplasm of brain: Secondary | ICD-10-CM | POA: Insufficient documentation

## 2023-02-27 LAB — RAD ONC ARIA SESSION SUMMARY
Course Elapsed Days: 7
Plan Fractions Treated to Date: 6
Plan Prescribed Dose Per Fraction: 5 Gy
Plan Total Fractions Prescribed: 10
Plan Total Prescribed Dose: 50 Gy
Reference Point Dosage Given to Date: 30 Gy
Reference Point Session Dosage Given: 5 Gy
Session Number: 6

## 2023-02-28 ENCOUNTER — Other Ambulatory Visit: Payer: Self-pay | Admitting: Physician Assistant

## 2023-02-28 ENCOUNTER — Telehealth: Payer: Self-pay

## 2023-02-28 ENCOUNTER — Encounter: Payer: Self-pay | Admitting: Internal Medicine

## 2023-02-28 ENCOUNTER — Ambulatory Visit
Admission: RE | Admit: 2023-02-28 | Discharge: 2023-02-28 | Disposition: A | Discharge status: Home / Self Care | Payer: Managed Care, Other (non HMO) | Source: Ambulatory Visit | Attending: Radiation Oncology

## 2023-02-28 ENCOUNTER — Other Ambulatory Visit: Payer: Self-pay

## 2023-02-28 ENCOUNTER — Encounter: Payer: Self-pay | Admitting: Physician Assistant

## 2023-02-28 DIAGNOSIS — R059 Cough, unspecified: Secondary | ICD-10-CM

## 2023-02-28 DIAGNOSIS — C7931 Secondary malignant neoplasm of brain: Secondary | ICD-10-CM | POA: Diagnosis not present

## 2023-02-28 LAB — RAD ONC ARIA SESSION SUMMARY
Course Elapsed Days: 8
Plan Fractions Treated to Date: 7
Plan Prescribed Dose Per Fraction: 5 Gy
Plan Total Fractions Prescribed: 10
Plan Total Prescribed Dose: 50 Gy
Reference Point Dosage Given to Date: 35 Gy
Reference Point Session Dosage Given: 5 Gy
Session Number: 7

## 2023-02-28 MED ORDER — HYDROCODONE BIT-HOMATROP MBR 5-1.5 MG/5ML PO SOLN: 5.0000 mL | Freq: Four times a day (QID) | ORAL | 0 refills | Status: DC | PRN

## 2023-02-28 NOTE — Progress Notes (Signed)
Has armband been applied?  Yes  Does patient have an allergy to IV contrast dye?: No   Has patient ever received premedication for IV contrast dye?:  No  Does patient take metformin?: No  If patient does take metformin when was the last dose: n/A  Date of lab work: 02/08/2023 BUN: 7 CR: 0.83 eGfr: >60  IV site: Right Chest Port  Has IV site been added to flowsheet?  Yes

## 2023-02-28 NOTE — Telephone Encounter (Signed)
Spoke with pt in regards to persistent cough.  Per [place], PA- sent in prescription for Hycodan.  Informed pt not to take Hycodan with Percocet and pt stated that he is no longer taking Percocet. Pt is also taking steroids and confirmed that pt is taking Prilosec daily to help decrease gastritis. Pt verbalized understanding and was told to call with any concerns or questions.

## 2023-03-01 ENCOUNTER — Other Ambulatory Visit: Payer: Self-pay

## 2023-03-01 ENCOUNTER — Ambulatory Visit
Admission: RE | Admit: 2023-03-01 | Discharge: 2023-03-01 | Discharge status: Home / Self Care | Payer: Managed Care, Other (non HMO) | Source: Ambulatory Visit | Attending: Radiation Oncology

## 2023-03-01 ENCOUNTER — Ambulatory Visit
Admission: RE | Admit: 2023-03-01 | Discharge: 2023-03-01 | Disposition: A | Discharge status: Home / Self Care | Payer: Managed Care, Other (non HMO) | Source: Ambulatory Visit | Attending: Radiation Oncology | Admitting: Radiation Oncology

## 2023-03-01 DIAGNOSIS — Z95828 Presence of other vascular implants and grafts: Secondary | ICD-10-CM

## 2023-03-01 DIAGNOSIS — C7931 Secondary malignant neoplasm of brain: Secondary | ICD-10-CM | POA: Diagnosis not present

## 2023-03-01 LAB — RAD ONC ARIA SESSION SUMMARY
Course Elapsed Days: 9
Plan Fractions Treated to Date: 8
Plan Prescribed Dose Per Fraction: 5 Gy
Plan Total Fractions Prescribed: 10
Plan Total Prescribed Dose: 50 Gy
Reference Point Dosage Given to Date: 40 Gy
Reference Point Session Dosage Given: 5 Gy
Session Number: 8

## 2023-03-01 MED ORDER — HEPARIN SOD (PORK) LOCK FLUSH 100 UNIT/ML IV SOLN
500.0000 [IU] | Freq: Once | INTRAVENOUS | Status: AC | PRN
  Administered 2023-03-01: 500 [IU]

## 2023-03-01 MED ORDER — SODIUM CHLORIDE 0.9% FLUSH
10.0000 mL | Freq: Once | INTRAVENOUS | Status: AC
  Administered 2023-03-01: 10 mL

## 2023-03-02 ENCOUNTER — Ambulatory Visit: Payer: Managed Care, Other (non HMO) | Admitting: Physician Assistant

## 2023-03-02 ENCOUNTER — Other Ambulatory Visit: Payer: Self-pay

## 2023-03-02 ENCOUNTER — Ambulatory Visit
Admission: RE | Admit: 2023-03-02 | Discharge: 2023-03-02 | Disposition: A | Discharge status: Home / Self Care | Payer: Managed Care, Other (non HMO) | Source: Ambulatory Visit | Attending: Radiation Oncology | Admitting: Radiation Oncology

## 2023-03-02 ENCOUNTER — Inpatient Hospital Stay: Payer: Managed Care, Other (non HMO) | Admitting: Physician Assistant

## 2023-03-02 ENCOUNTER — Inpatient Hospital Stay: Payer: Managed Care, Other (non HMO)

## 2023-03-02 ENCOUNTER — Inpatient Hospital Stay: Payer: Managed Care, Other (non HMO) | Admitting: Internal Medicine

## 2023-03-02 ENCOUNTER — Other Ambulatory Visit: Payer: Managed Care, Other (non HMO)

## 2023-03-02 VITALS — BP 102/64 | HR 96 | Temp 97.7°F | Resp 20 | Wt 185.7 lb

## 2023-03-02 DIAGNOSIS — R059 Cough, unspecified: Secondary | ICD-10-CM | POA: Diagnosis not present

## 2023-03-02 DIAGNOSIS — C3491 Malignant neoplasm of unspecified part of right bronchus or lung: Secondary | ICD-10-CM | POA: Diagnosis not present

## 2023-03-02 DIAGNOSIS — C7931 Secondary malignant neoplasm of brain: Secondary | ICD-10-CM

## 2023-03-02 DIAGNOSIS — Z95828 Presence of other vascular implants and grafts: Secondary | ICD-10-CM

## 2023-03-02 DIAGNOSIS — Z5112 Encounter for antineoplastic immunotherapy: Secondary | ICD-10-CM | POA: Diagnosis not present

## 2023-03-02 LAB — RAD ONC ARIA SESSION SUMMARY
Course Elapsed Days: 10
Plan Fractions Treated to Date: 9
Plan Prescribed Dose Per Fraction: 5 Gy
Plan Total Fractions Prescribed: 10
Plan Total Prescribed Dose: 50 Gy
Reference Point Dosage Given to Date: 45 Gy
Reference Point Session Dosage Given: 5 Gy
Session Number: 9

## 2023-03-02 LAB — CBC WITH DIFFERENTIAL (CANCER CENTER ONLY)
Abs Immature Granulocytes: 0.07 10*3/uL (ref 0.00–0.07)
Basophils Absolute: 0 10*3/uL (ref 0.0–0.1)
Basophils Relative: 0 %
Eosinophils Absolute: 0 10*3/uL (ref 0.0–0.5)
Eosinophils Relative: 1 %
HCT: 33.1 % — ABNORMAL LOW (ref 39.0–52.0)
Hemoglobin: 10.4 g/dL — ABNORMAL LOW (ref 13.0–17.0)
Immature Granulocytes: 1 %
Lymphocytes Relative: 4 %
Lymphs Abs: 0.3 10*3/uL — ABNORMAL LOW (ref 0.7–4.0)
MCH: 27.5 pg (ref 26.0–34.0)
MCHC: 31.4 g/dL (ref 30.0–36.0)
MCV: 87.6 fL (ref 80.0–100.0)
Monocytes Absolute: 0.6 10*3/uL (ref 0.1–1.0)
Monocytes Relative: 7 %
Neutro Abs: 7.3 10*3/uL (ref 1.7–7.7)
Neutrophils Relative %: 87 %
Platelet Count: 298 10*3/uL (ref 150–400)
RBC: 3.78 MIL/uL — ABNORMAL LOW (ref 4.22–5.81)
RDW: 16.9 % — ABNORMAL HIGH (ref 11.5–15.5)
WBC Count: 8.3 10*3/uL (ref 4.0–10.5)
nRBC: 0 % (ref 0.0–0.2)

## 2023-03-02 LAB — CMP (CANCER CENTER ONLY)
ALT: 11 U/L (ref 0–44)
AST: 13 U/L — ABNORMAL LOW (ref 15–41)
Albumin: 3.7 g/dL (ref 3.5–5.0)
Alkaline Phosphatase: 115 U/L (ref 38–126)
Anion gap: 7 (ref 5–15)
BUN: 6 mg/dL — ABNORMAL LOW (ref 8–23)
CO2: 24 mmol/L (ref 22–32)
Calcium: 8.4 mg/dL — ABNORMAL LOW (ref 8.9–10.3)
Chloride: 104 mmol/L (ref 98–111)
Creatinine: 0.93 mg/dL (ref 0.61–1.24)
GFR, Estimated: >60 mL/min (ref >60)
Glucose, Bld: 160 mg/dL — ABNORMAL HIGH (ref 70–99)
Potassium: 3.5 mmol/L (ref 3.5–5.1)
Sodium: 135 mmol/L (ref 135–145)
Total Bilirubin: 0.4 mg/dL (ref <1.2)
Total Protein: 6.3 g/dL — ABNORMAL LOW (ref 6.5–8.1)

## 2023-03-02 LAB — TSH: TSH: 7.766 u[IU]/mL — ABNORMAL HIGH (ref 0.350–4.500)

## 2023-03-02 MED ORDER — PEMBROLIZUMAB CHEMO INJECTION 100 MG/4ML
200.0000 mg | Freq: Once | INTRAVENOUS | Status: AC
  Administered 2023-03-02: 200 mg via INTRAVENOUS
  Filled 2023-03-02: qty 200

## 2023-03-02 MED ORDER — DEXAMETHASONE 1 MG PO TABS: 1.0000 mg | ORAL_TABLET | Freq: Every day | ORAL | 1 refills | Status: DC

## 2023-03-02 MED ORDER — HEPARIN SOD (PORK) LOCK FLUSH 100 UNIT/ML IV SOLN
500.0000 [IU] | Freq: Once | INTRAVENOUS | Status: AC | PRN
Start: 2023-03-02 — End: 2023-03-02
  Administered 2023-03-02: 500 [IU]

## 2023-03-02 MED ORDER — AZITHROMYCIN 250 MG PO TABS: ORAL_TABLET | ORAL | 0 refills | Status: DC

## 2023-03-02 MED ORDER — SODIUM CHLORIDE 0.9% FLUSH
10.0000 mL | INTRAVENOUS | Status: DC | PRN
  Administered 2023-03-02: 10 mL

## 2023-03-02 MED ORDER — SODIUM CHLORIDE 0.9 % IV SOLN: Freq: Once | INTRAVENOUS | Status: AC

## 2023-03-02 NOTE — Progress Notes (Signed)
[facility] at [facility] [address]  [place], [place] [address] [phone]   Interval Evaluation  Date of Service: 03/02/23 Patient Name: Joseph Atkins Patient MRN: [id number] Patient DOB: [birth year] Provider: Henreitta Leber, MD  Identifying Statement:  Joseph Atkins is a 62 y.o. male with Malignant neoplasm metastatic to brain [facility]) [C79.31]   Primary Cancer:  Oncologic History: Oncology History  Adenocarcinoma of right lung, stage 4 (HCC)  11/14/2019 Initial Diagnosis   Adenocarcinoma of right lung, stage 4 (HCC)   11/25/2019 - 01/06/2020 Chemotherapy   The patient had palonosetron (ALOXI) injection 0.25 mg, 0.25 mg, Intravenous,  Once, 7 of 7 cycles Administration: 0.25 mg (11/25/2019), 0.25 mg (12/23/2019), 0.25 mg (12/31/2019), 0.25 mg (12/02/2019), 0.25 mg (01/06/2020), 0.25 mg (12/09/2019), 0.25 mg (12/16/2019) CARBOplatin (PARAPLATIN) 250 mg in sodium chloride 0.9 % 250 mL chemo infusion, 249.8 mg (100 % of original dose 249.8 mg), Intravenous,  Once, 7 of 7 cycles Dose modification: 249.8 mg (original dose 249.8 mg, Cycle 1) Administration: 250 mg (11/25/2019), 250 mg (12/23/2019), 250 mg (12/31/2019), 250 mg (12/02/2019), 250 mg (01/06/2020), 270 mg (12/09/2019), 250 mg (12/16/2019) PACLitaxel (TAXOL) 90 mg in sodium chloride 0.9 % 250 mL chemo infusion (</= 80mg /m2), 45 mg/m2 = 90 mg, Intravenous,  Once, 7 of 7 cycles Administration: 90 mg (11/25/2019), 90 mg (12/23/2019), 90 mg (12/31/2019), 90 mg (12/02/2019), 90 mg (01/06/2020), 90 mg (12/09/2019), 90 mg (12/16/2019)  for chemotherapy treatment.    02/18/2020 - 03/23/2022 Chemotherapy   Patient is on Treatment Plan : LUNG NSCLC Pembrolizumab (200) q21d     03/31/2020 - 07/21/2020 Chemotherapy    Patient is on Treatment Plan: LUNG NSCLC FLAT DOSE PEMBROLIZUMAB Q21D      05/19/2020 Cancer Staging   Staging form: Lung, AJCC 8th Edition - Clinical: Stage IVB (cT3, cN0, cM1c) - Signed by Si Gaul, MD on  05/19/2020   11/16/2022 -  Chemotherapy   Patient is on Treatment Plan : LUNG NSCLC Pembrolizumab (200) q21d      CNS Oncologic History 11/14/19: Completes single frx SRS to 2.4cm cerebellar metastasis 03/05/20: SRS to 7 targets [name]) 06/19/20: SRS to 6 additional targets [name]) 12/22/22: SRS R occipital ([place]) 03/03/23: Kathleene Hazel [name])  Interval History:  Joseph Atkins presents today for follow up after recent MRI brain.  He has been undergoing radiation to his lung, tolerating well overall.  Continues to have issues with balance, uses a cane.  When he tries to decrease the decadron from 1mg  daily, balance is worse.  No headaches or seizures.    Prior- He describes recurrence of balance issues from prior.  Symptoms declined when he stopped the decadron on 9/14.  He is needing to use a walker.  Also having more headaches and impaired memory.  He does complain of ongoing fatigue and low energy, not worse from prior. Continues Keytruda for disease recurrence with Dr. Arbutus Ped.  H+P (01/24/20) Patient presents today to discuss recent recurrence of neurologic symptoms following radiosurgery in July 2021.  He describes poor balance, wide based walking, needing to hold on to family or objects to walk safely.  He also describes nausea and dizzy-headed feeling at times.  Overall he is functioning in an impaired and sluggish way.  Symptoms have become noticeable since decreasing decadron to less than 4mg  daily; currently he is dosing 2mg  daily.  He felt "quite good" immediately after radiation and during higher dose steroid therapy.  No complications from decadron that he  can report.  Recently completed chemoradioatherapy induction for lung adenocarcinoma with Dr. Arbutus Ped.  Medications: Current Outpatient Medications on File Prior to Visit  Medication Sig Dispense Refill   albuterol (PROVENTIL HFA;VENTOLIN HFA) 108 (90 Base) MCG/ACT inhaler Inhale 2 puffs into the lungs every 6 (six) hours as needed  for wheezing or shortness of breath. 1 Inhaler 0   amLODipine (NORVASC) 5 MG tablet TAKE 1 TABLET (5 MG TOTAL) BY MOUTH DAILY. 90 tablet 1   ANORO ELLIPTA 62.5-25 MCG/INH AEPB 1 puff daily.     augmented betamethasone dipropionate (DIPROLENE-AF) 0.05 % cream Apply topically 2 (two) times daily.     benzonatate (TESSALON) 100 MG capsule Take 1 capsule (100 mg total) by mouth 3 (three) times daily as needed. 30 capsule 2   [employer] ASPIRIN LOW DOSE 81 MG tablet TAKE 1 TABLET (81 MG TOTAL) BY MOUTH DAILY. SWALLOW WHOLE. 90 tablet 3   cyclobenzaprine (FLEXERIL) 10 MG tablet Take 10 mg by mouth 2 (two) times daily as needed for muscle spasms.     dexamethasone (DECADRON) 1 MG tablet Take 2 tablets (2 mg total) by mouth daily with breakfast. 60 tablet 1   docusate sodium (COLACE) 100 MG capsule Take 100 mg by mouth daily.     DULoxetine (CYMBALTA) 20 MG capsule Take 1 capsule (20 mg total) by mouth 2 (two) times daily. 60 capsule 5   fluticasone furoate-vilanterol (BREO ELLIPTA) 100-25 MCG/INH AEPB Inhale 1 puff into the lungs daily. 30 each 3   HYDROcodone bit-homatropine (HYCODAN) 5-1.5 MG/5ML syrup Take 5 mLs by mouth every 6 (six) hours as needed for cough. 120 mL 0   hydrOXYzine (ATARAX) 25 MG tablet Take 25 mg by mouth at bedtime.     levothyroxine (SYNTHROID) 100 MCG tablet TAKE 1 TABLET BY MOUTH EVERY DAY 90 tablet 1   lidocaine-prilocaine (EMLA) cream Apply 1 application topically as needed. 30 g 0   omeprazole (PRILOSEC) 40 MG capsule Take 1 capsule (40 mg total) by mouth daily. 90 capsule 4   ondansetron (ZOFRAN) 4 MG tablet Take 1 tablet (4 mg total) by mouth every 8 (eight) hours as needed. 40 tablet 2   oxyCODONE-acetaminophen (PERCOCET/ROXICET) 5-325 MG tablet Take 1 tablet by mouth every 4 (four) hours as needed for severe pain. 30 tablet 0   pembrolizumab (KEYTRUDA) 100 MG/4ML SOLN See admin instructions.     polyethylene glycol powder (MIRALAX) powder Take 17 g by mouth daily. 255 g 11    prochlorperazine (COMPAZINE) 10 MG tablet Take 1 tablet (10 mg total) by mouth every 6 (six) hours as needed. 30 tablet 2   temazepam (RESTORIL) 30 MG capsule Take 1 capsule (30 mg total) by mouth at bedtime as needed for sleep. 30 capsule 0   triamcinolone cream (KENALOG) 0.1 % APPLY TOPICALLY 2 TIMES DAILY AS NEEDED. 454 g 0   TRULANCE 3 MG TABS Take 1 tablet by mouth daily.     No current facility-administered medications on file prior to visit.    Allergies:  Allergies  Allergen Reactions   Penicillins Other (See Comments)    Childhood allergy.  Has patient had a PCN reaction causing immediate rash, facial/tongue/throat swelling, SOB or lightheadedness with hypotension: unknown Has patient had a PCN reaction causing severe rash involving mucus membranes or skin necrosis: unknown Has patient had a PCN reaction that required hospitalization: unknown Has patient had a PCN reaction occurring within the last 10 years: no If all of the above answers are "NO", then  may proceed with Cephalosporin use.    Past Medical History:  Past Medical History:  Diagnosis Date   Cancer (HCC)    lung cancer   Diverticulosis    GERD (gastroesophageal reflux disease)    Hx of small bowel obstruction    Hyperlipidemia    Hypertension    Hypothyroidism    IBS (irritable bowel syndrome)    Substance abuse (HCC)    Alcoholic, Drug addition   Thyroid disease    Past Surgical History:  Past Surgical History:  Procedure Laterality Date   arm surgery Right    BRONCHIAL BRUSHINGS  10/24/2019   Procedure: BRONCHIAL BRUSHINGS;  Surgeon: Leslye Peer, MD;  Location: [facility] ENDOSCOPY;  Service: Cardiopulmonary;;  right lower lobe    BRONCHIAL BRUSHINGS  11/05/2019   Procedure: BRONCHIAL BRUSHINGS;  Surgeon: Leslye Peer, MD;  Location[facility] ENDOSCOPY;  Service: Pulmonary;;   BRONCHIAL NEEDLE ASPIRATION BIOPSY  10/24/2019   Procedure: BRONCHIAL NEEDLE ASPIRATION BIOPSIES;  Surgeon: Leslye Peer, MD;   Location: [facility] ENDOSCOPY;  Service: Cardiopulmonary;;   BRONCHIAL NEEDLE ASPIRATION BIOPSY  11/05/2019   Procedure: BRONCHIAL NEEDLE ASPIRATION BIOPSIES;  Surgeon: Leslye Peer, MD;  Location: [facility] ENDOSCOPY;  Service: Pulmonary;;   ENDOBRONCHIAL ULTRASOUND N/A 10/24/2019   Procedure: ENDOBRONCHIAL ULTRASOUND;  Surgeon: Leslye Peer, MD;  Location: [facility] ENDOSCOPY;  Service: Cardiopulmonary;  Laterality: N/A;   FINGER SURGERY Right    Middle   IR IMAGING GUIDED PORT INSERTION  11/19/2019   VIDEO BRONCHOSCOPY N/A 10/24/2019   Procedure: VIDEO BRONCHOSCOPY WITHOUT FLUORO;  Surgeon: Leslye Peer, MD;  Location: [facility] ENDOSCOPY;  Service: Cardiopulmonary;  Laterality: N/A;   VIDEO BRONCHOSCOPY WITH ENDOBRONCHIAL NAVIGATION N/A 11/05/2019   Procedure: VIDEO BRONCHOSCOPY WITH ENDOBRONCHIAL NAVIGATION;  Surgeon: Leslye Peer, MD;  Location: [facility] ENDOSCOPY;  Service: Pulmonary;  Laterality: N/A;   Social History:  Social History   Socioeconomic History   Marital status: Married    Spouse name: Not on file   Number of children: 2   Years of education: Not on file   Highest education level: Not on file  Occupational History   Occupation: supervisor    Employer: [employer]  Tobacco Use   Smoking status: Former    Current packs/day: 1.50    Types: Cigarettes   Smokeless tobacco: Former    Types: Associate Professor status: Never Used  Substance and Sexual Activity   Alcohol use: No    Comment: Alcoholic clean for 6 years   Drug use: No    Comment: Recovering Drug Addict-clean for 6 years   Sexual activity: Not on file  Other Topics Concern   Not on file  Social History Narrative   Not on file   Social Determinants of Health   Financial Resource Strain: Medium Risk (10/31/2019)   Overall Financial Resource Strain (CARDIA)    Difficulty of Paying Living Expenses: Somewhat hard  Food Insecurity: No Food Insecurity (12/13/2022)   Hunger Vital Sign    Worried About Running Out of  Food in the Last Year: Never true    Ran Out of Food in the Last Year: Never true  Transportation Needs: No Transportation Needs (12/13/2022)   PRAPARE - Administrator, Civil Service (Medical): No    Lack of Transportation (Non-Medical): No  Physical Activity: Insufficiently Active (10/31/2019)   Exercise Vital Sign    Days of Exercise per Week: 5 days    Minutes of Exercise  per Session: 20 min  Stress: Stress Concern Present (10/31/2019)   [employer] of Occupational Health - Occupational Stress Questionnaire    Feeling of Stress : Very much  Social Connections: Moderately Isolated (10/31/2019)   Social Connection and Isolation Panel [NHANES]    Frequency of Communication with Friends and Family: More than three times a week    Frequency of Social Gatherings with Friends and Family: More than three times a week    Attends Religious Services: Never    Database administrator or Organizations: No    Attends Banker Meetings: Never    Marital Status: Married  Catering manager Violence: Not At Risk (12/13/2022)   Humiliation, Afraid, Rape, and Kick questionnaire    Fear of Current or Ex-Partner: No    Emotionally Abused: No    Physically Abused: No    Sexually Abused: No   Family History:  Family History  Problem Relation Age of Onset   Epilepsy Mother    Heart disease Mother    Heart disease Brother    Lung cancer Paternal Uncle     Review of Systems: Constitutional: Doesn't report fevers, chills or abnormal weight loss Eyes: Doesn't report blurriness of vision Ears, nose, mouth, throat, and face: Doesn't report sore throat Respiratory: Doesn't report cough, dyspnea or wheezes Cardiovascular: Doesn't report palpitation, chest discomfort  Gastrointestinal:  Doesn't report nausea, constipation, diarrhea GU: Doesn't report incontinence Skin: Doesn't report skin rashes Neurological: Per HPI Musculoskeletal: Doesn't report joint pain Behavioral/Psych:  Doesn't report anxiety  Physical Exam: Wt Readings from Last 3 Encounters:  02/08/23 188 lb 6.4 oz (85.5 kg)  01/26/23 188 lb 6.4 oz (85.5 kg)  01/18/23 185 lb 14.4 oz (84.3 kg)   Temp Readings from Last 3 Encounters:  02/08/23 97.7 F (36.5 C) (Oral)  01/26/23 97.6 F (36.4 C) (Temporal)  01/18/23 (!) 97.4 F (36.3 C) (Oral)   BP Readings from Last 3 Encounters:  02/08/23 120/69  01/26/23 112/62  01/18/23 107/75   Pulse Readings from Last 3 Encounters:  02/08/23 99  01/26/23 (!) 103  01/18/23 (!) 105     KPS: 70. General: Alert, cooperative, pleasant, in no acute distress Head: Normal EENT: No conjunctival injection or scleral icterus.  Lungs: Resp effort normal Cardiac: Regular rate Abdomen: Non-distended abdomen Skin: No rashes cyanosis or petechiae. Extremities: No clubbing or edema  Neurologic Exam: Mental Status: Awake, alert, attentive to examiner. Oriented to self and environment. Language is fluent with intact comprehension.  Cranial Nerves: Visual acuity is grossly normal. Visual fields are full. Extra-ocular movements intact. No ptosis. Face is symmetric Motor: Tone and bulk are normal. Power is 4/5 in right leg. Reflexes are symmetric, no pathologic reflexes present.  Dysmetria L>R Sensory: Intact to light touch Gait: Dystaxic, wide based   Labs: I have reviewed the data as listed    Component Value Date/Time   NA 135 03/02/2023 0850   NA 141 03/08/2017 1707   K 3.5 03/02/2023 0850   CL 104 03/02/2023 0850   CO2 24 03/02/2023 0850   GLUCOSE 160 (H) 03/02/2023 0850   BUN 6 (L) 03/02/2023 0850   BUN 12 03/08/2017 1707   CREATININE 0.93 03/02/2023 0850   CALCIUM 8.4 (L) 03/02/2023 0850   PROT 6.3 (L) 03/02/2023 0850   PROT 7.1 03/08/2017 1707   ALBUMIN 3.7 03/02/2023 0850   ALBUMIN 4.5 03/08/2017 1707   AST 13 (L) 03/02/2023 0850   ALT 11 03/02/2023 0850   ALKPHOS 115  03/02/2023 0850   BILITOT 0.4 03/02/2023 0850   GFRNONAA >60 03/02/2023  0850   GFRAA >60 01/06/2020 0837   Lab Results  Component Value Date   WBC 8.3 03/02/2023   NEUTROABS 7.3 03/02/2023   HGB 10.4 (L) 03/02/2023   HCT 33.1 (L) 03/02/2023   MCV 87.6 03/02/2023   PLT 298 03/02/2023   Imaging:  [facility] Clinician Interpretation: I have personally reviewed the CNS images as listed.  My interpretation, in the context of the patient's clinical presentation, is progressive disease  MR BRAIN W WO CONTRAST  Result Date: 02/22/2023 CLINICAL DATA:  Metastatic lung cancer status post SRS in 2021 and 2022. EXAM: MRI HEAD WITHOUT AND WITH CONTRAST TECHNIQUE: Multiplanar, multiecho pulse sequences of the brain and surrounding structures were obtained without and with intravenous contrast. CONTRAST:  9 cc Vueway COMPARISON:  Brain MRI 12/07/2022 FINDINGS: Brain: Numerous enhancing lesions are again seen. Stable or smaller lesions: *2.6 cm x 1.9 cm x 2.0 lesion in the left cerebellar hemisphere with intralesional hemorrhage is stable to slightly decreased in size from 2.9 cm x 2.0 cm x 2.2 cm on the prior study. Surrounding FLAIR signal abnormality extending to the middle cerebellar peduncle is not significantly changed. *Punctate lesion in the medial right occipital lobe is unchanged (13-84). *A 4 mm lesion in the right occipital lobe more posteriorly and superiorly is unchanged (13-91). *Curvilinear enhancement measuring 4 mm in the medial left occipital lobe is not significantly changed (13-82). *Curvilinear enhancement in the left occipital lobe more posteriorly and superiorly measuring 6 mm is unchanged (13-97). *An ill-defined lesion measuring 3 mm in the right temporal lobe is unchanged (13-87). *A 6 mm lesion in the right precentral gyrus is not significantly changed (13-137). *A 7 mm lesion in the left precentral gyrus at the vertex is unchanged (13-150). New or enlarging lesions: *Punctate lesion in the right parietal lobe not definitely present on the prior study (13-112).  *There are no other new or enlarging lesions. *There is no significant edema surrounding the supratentorial lesions. There is no acute intracranial hemorrhage, extra-axial fluid collection, or acute infarct. Parenchymal volume is stable. The ventricles are stable in size. Background chronic small-vessel ischemic change is stable. The pituitary and suprasellar region are normal. There is no mass effect or midline shift. Vascular: Normal flow voids. Skull and upper cervical spine: Normal marrow signal. Sinuses/Orbits: The paranasal sinuses are clear. The globes and orbits are unremarkable. Other: There is a trace left mastoid effusion. No obstructing nasopharyngeal mass is seen. IMPRESSION: 1. The largest enhancing lesion in the left cerebellar hemisphere is stable to slightly decreased in size since the study from 12/07/2022. Surrounding FLAIR signal abnormality is not significantly changed. 2. Single new punctate lesion in the right parietal lobe. 3. The numerous additional small enhancing lesions in the supratentorial brain detailed above are not significantly changed. Electronically Signed   By: Lesia Hausen M.D.   On: 02/22/2023 16:44    Assessment/Plan Malignant neoplasm metastatic to brain [facility]) [C79.31]  NICHALAS COIN is clinically stable today, but MRI brain demonstrates 2 small metastatic foci which are outside prior radiation fields.    He will proceed with SRS as scheduled with Dr. [name].  Decadron may continue at 1mg  daily, he should try to decrease this to 0.5mg  daily after radiation if tolerated, then stop if tolerated.    He will continue on Keytruda monotherapy with Dr. Arbutus Ped.  We ask that CAYMEN DUBRAY return to clinic in 3 months  following next brain MRI, or sooner as needed.  We appreciate the opportunity to participate in the care of JAMION CARTER.   All questions were answered. The patient knows to call the clinic with any problems, questions or concerns. No  barriers to learning were detected.  The total time spent in the encounter was 40 minutes and more than 50% was on counseling and review of test results   Henreitta Leber, MD Medical Director of Neuro-Oncology [facility] at [facility] 03/02/23 9:36 AM

## 2023-03-02 NOTE — Patient Instructions (Signed)

## 2023-03-03 ENCOUNTER — Ambulatory Visit
Admission: RE | Admit: 2023-03-03 | Discharge: 2023-03-03 | Disposition: A | Discharge status: Home / Self Care | Payer: Managed Care, Other (non HMO) | Source: Ambulatory Visit | Attending: Radiation Oncology

## 2023-03-03 ENCOUNTER — Other Ambulatory Visit: Payer: Self-pay

## 2023-03-03 DIAGNOSIS — C7931 Secondary malignant neoplasm of brain: Secondary | ICD-10-CM | POA: Diagnosis not present

## 2023-03-03 LAB — RAD ONC ARIA SESSION SUMMARY
Course Elapsed Days: 11
Plan Fractions Treated to Date: 10
Plan Prescribed Dose Per Fraction: 5 Gy
Plan Total Fractions Prescribed: 10
Plan Total Prescribed Dose: 50 Gy
Reference Point Dosage Given to Date: 50 Gy
Reference Point Session Dosage Given: 5 Gy
Session Number: 10

## 2023-03-03 LAB — T4: T4, Total: 7.5 ug/dL (ref 4.5–12.0)

## 2023-03-04 DIAGNOSIS — C7931 Secondary malignant neoplasm of brain: Secondary | ICD-10-CM | POA: Diagnosis not present

## 2023-03-05 ENCOUNTER — Other Ambulatory Visit: Payer: Self-pay

## 2023-03-07 ENCOUNTER — Ambulatory Visit
Admission: RE | Admit: 2023-03-07 | Discharge: 2023-03-07 | Disposition: A | Discharge status: Home / Self Care | Payer: Managed Care, Other (non HMO) | Source: Ambulatory Visit | Attending: Radiation Oncology | Admitting: Radiation Oncology

## 2023-03-07 ENCOUNTER — Other Ambulatory Visit: Payer: Self-pay

## 2023-03-07 ENCOUNTER — Other Ambulatory Visit: Payer: Self-pay | Admitting: Physician Assistant

## 2023-03-07 DIAGNOSIS — C7931 Secondary malignant neoplasm of brain: Secondary | ICD-10-CM | POA: Diagnosis not present

## 2023-03-07 DIAGNOSIS — R059 Cough, unspecified: Secondary | ICD-10-CM

## 2023-03-07 LAB — RAD ONC ARIA SESSION SUMMARY
Course Elapsed Days: 15
Plan Fractions Treated to Date: 1
Plan Fractions Treated to Date: 1
Plan Prescribed Dose Per Fraction: 20 Gy
Plan Prescribed Dose Per Fraction: 20 Gy
Plan Total Fractions Prescribed: 1
Plan Total Fractions Prescribed: 1
Plan Total Prescribed Dose: 20 Gy
Plan Total Prescribed Dose: 20 Gy
Reference Point Dosage Given to Date: 20 Gy
Reference Point Dosage Given to Date: 20 Gy
Reference Point Session Dosage Given: 20 Gy
Reference Point Session Dosage Given: 20 Gy
Session Number: 11

## 2023-03-07 MED ORDER — HYDROCODONE BIT-HOMATROP MBR 5-1.5 MG/5ML PO SOLN: 5.0000 mL | Freq: Four times a day (QID) | ORAL | 0 refills | Status: DC | PRN

## 2023-03-07 NOTE — Progress Notes (Signed)
Mr. Gleissner rested with [name] for 30 minutes following his SRS treatment.  Patient denies headache, dizziness, nausea, diplopia or ringing in the ears. Denies fatigue. Patient without complaints. Understands to avoid strenuous activity for the next 24 hours and call [phone] with needs.   BP 111/76 (BP Location: Left Arm, Patient Position: Sitting, Cuff Size: Normal)   Pulse 89   Temp (!) 97.3 F (36.3 C)   Resp 20   Ht 5\' 8"  (1.727 m)   SpO2 100%   BMI 28.24 kg/m    Lind Covert RN, BSN

## 2023-03-07 NOTE — Radiation Completion Notes (Signed)
  Radiation Oncology         [phone] ________________________________  Name: Joseph Atkins MRN: [id number]  Date of Service: 03/07/2023  DOB: 08/02/1960  End of Treatment Note    Diagnosis: Stage IV, cT3, N0, M1c, non-small cell lung cancer, adenocarcinoma of the right lower lobe with locally recurrent disease   Intent: Palliative     ==========DELIVERED PLANS==========  First Treatment Date: 2023-02-20 - Last Treatment Date: 2023-03-07   Plan Name: Lung_R_UHRT Site: Lung, RLL Technique: IMRT Mode: Photon Dose Per Fraction: 5 Gy Prescribed Dose (Delivered / Prescribed): 50 Gy / 50 Gy Prescribed Fxs (Delivered / Prescribed): 10 / 10    03/07/23 SRS Treatment: Plan Name: Brain_SRS1 Site: Brain [id number] Technique: SBRT/SRT-3D Mode: Photon Dose Per Fraction: 20 Gy Prescribed Dose (Delivered / Prescribed): 20 Gy / 20 Gy Prescribed Fxs (Delivered / Prescribed): 1 / 1   Plan Name: Brain_SRS2 Site: Brain [id number]  Technique: SBRT/SRT-3D Mode: Photon Dose Per Fraction: 20 Gy Prescribed Dose (Delivered / Prescribed): 20 Gy / 20 Gy Prescribed Fxs (Delivered / Prescribed): 1 / 1     ==========ON TREATMENT VISIT DATES========== 2023-02-24, 2023-03-03, 2023-03-07, 2023-03-07     See weekly On Treatment Notes in Epic for details.The patient tolerated radiation.    The patient will receive a call in about one month from the radiation oncology department. He will continue follow up with Dr. Arbutus Ped as well as with surveillance imaging also with Dr. Barbaraann Cao.      Osker Mason, PAC

## 2023-03-07 NOTE — Progress Notes (Signed)
  Name: [name]  MRN: [id number]  Date: 03/07/2023   DOB: Oct 26, 1960  Stereotactic Radiosurgery Operative Note  PRE-OPERATIVE DIAGNOSIS:  Multiple Brain Metastases  POST-OPERATIVE DIAGNOSIS:  Multiple Brain Metastases  PROCEDURE:  Stereotactic Radiosurgery  SURGEON:  Stefani Dama, MD  NARRATIVE: The patient underwent a radiation treatment planning session in the radiation oncology simulation suite under the care of the radiation oncology physician and physicist.  I participated closely in the radiation treatment planning afterwards. The patient underwent planning CT which was fused to 3T high resolution MRI with 1 mm axial slices.  These images were fused on the planning system.  We contoured the gross target volumes and subsequently expanded this to yield the Planning Target Volume. I actively participated in the planning process.  I helped to define and review the target contours and also the contours of the optic pathway, eyes, brainstem and selected nearby organs at risk.  All the dose constraints for critical structures were reviewed and compared to AAPM Task Group 101.  The prescription dose conformity was reviewed.  I approved the plan electronically.    Accordingly, Joseph Atkins was brought to the TrueBeam stereotactic radiation treatment linac and placed in the custom immobilization mask.  The patient was aligned according to the IR fiducial markers with BrainLab Exactrac, then orthogonal x-rays were used in ExacTrac with the 6DOF robotic table and the shifts were made to align the patient  Joseph Atkins received stereotactic radiosurgery uneventfully.    Lesions treated:  2   Complex lesions treated:  0 (>3.5 cm, <30mm of optic path, or within the brainstem)   The detailed description of the procedure is recorded in the radiation oncology procedure note.  I was present for the duration of the procedure.  DISPOSITION:  Following delivery, the patient was  transported to nursing in stable condition and monitored for possible acute effects to be discharged to home in stable condition with follow-up in one month.  Stefani Dama, MD 03/07/2023 2:32 PM

## 2023-03-08 ENCOUNTER — Other Ambulatory Visit: Payer: Self-pay | Admitting: Radiation Therapy

## 2023-03-15 ENCOUNTER — Encounter: Payer: Self-pay | Admitting: Internal Medicine

## 2023-03-15 ENCOUNTER — Encounter: Payer: Self-pay | Admitting: Physician Assistant

## 2023-03-15 NOTE — Telephone Encounter (Signed)
Telephone call  

## 2023-03-15 NOTE — Progress Notes (Signed)
[facility] OFFICE PROGRESS NOTE  [name], NP [address] [name] [address] [place] [address]  DIAGNOSIS:  Stage IV (T3, N0, M1C) non-small cell lung cancer, adenocarcinoma.  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 He was diagnosed in July 2021.   Molecular Biomarkers:  MSI-High DETECTED Pembrolizumab Atezolizumab, Avelumab, Cemiplimab, Dostarlimab, Durvalumab, Ipilimumab, Nivolumab   STK11Splice Site SNV 1.9% Everolimus, Temsirolimus Yes   [id number] 1.7% Binimetinib Yes   [id number] 0.4%   Niraparib, Olaparib, Rucaparib, Talazoparib, Tazemetostat Yes   PRIOR THERAPY: 1) SRS to the solitary brain metastasis under the care of Dr. [name]. Last treatment 11/14/19. 2) Weekly concurrent chemoradiation with carboplatin for an AUC of 2, paclitaxel 45 mg/m2.  First dose expected on 11/25/2019. Status post 7 cycles, last dose was given 01/06/2020 with partial response.  3)  Immunotherapy with Keytruda 200 mg IV every 3 weeks.  First dose February 10, 2020 for a patient with MSI high.  Status post 35  cycles. 4) Avastin 15 mg/KG every 3 weeks.  First dose today for the vasogenic edema of the brain.S/P 7 cycles. 5) The patient had evidence for local disease recurrence in July 2024. 6) SRS to the occipital brain lesion under the care of Dr. [name] on 12/22/22 7) 2) UHRT under the care of Dr. [name], last dose expected on 03/03/23 8) SRS to the new metastatic brain lesion on 03/07/23  CURRENT THERAPY:1) Resuming his treatment with Keytruda 200 Mg IV every 3 weeks. First dose 11/16/2022. Status post 6 cycles.    INTERVAL HISTORY: Joseph Atkins 62 y.o. male returns to the clinic today for a follow up visit accompanied by his wife. The patient recently had evidence of disease progression with increased activity of the central aspect of the right lower lobe concerning for local disease  recurrence. Therefore, he resumed immunotherapy with [name]. He tolerates this well without any adverse side effects except in the past he has had pruritic nodular lesions from [facility] for which he would use kenalog cream and zyrtec. He had a restaging CT scan at his last appointment which showed enlarging lung lesion. Therefore, he was re-referred to radiation oncology and recently completed UHRT in November 2024.  Since undergoing radiation, he had some abdominal cramping and bloating. The abdominal cramping is better at this time. He used heating pads. He uses prilosec. He does not have heart burn unless he misses a dose of prilosec. He is eating less because he has bloating. He lost weight since last being seen. He still is taking decadron for his metastatic disease to the brain. He tried to decrease the dose to 1/2 tablet per day but did not feel right so he increased it back to 1 tablet per day.   At his past appointment, he was a z pack and hycodan for cough. Overall, his cough is not as bad as previously. He states he is not taking as much cough medication as he was previously.   Denies any associated fevers, chills, night sweats, chest pain, hemoptysis, or signs of infection. Denies changes in his shortness of breath unless he has shortness of breath with the coughing spell. At prior appointments, he was having some swelling on the right upper and lower extremity swelling which has since improved.    He is followed closely by Dr. Barbaraann Cao for his metastatic disease to the brain.   Denies any fever or night  sweats. Denies any diarrhea or constipation unless he takes too much miralax.  Denies any new rashes or skin changes.  He is here today for evaluation repeat blood work before undergoing cycle #7   MEDICAL HISTORY: Past Medical History:  Diagnosis Date   Cancer (HCC)    lung cancer   Diverticulosis    GERD (gastroesophageal reflux disease)    Hx of small bowel obstruction     Hyperlipidemia    Hypertension    Hypothyroidism    IBS (irritable bowel syndrome)    Substance abuse (HCC)    Alcoholic, Drug addition   Thyroid disease     ALLERGIES:  is allergic to penicillins.  MEDICATIONS:  Current Outpatient Medications  Medication Sig Dispense Refill   albuterol (PROVENTIL HFA;VENTOLIN HFA) 108 (90 Base) MCG/ACT inhaler Inhale 2 puffs into the lungs every 6 (six) hours as needed for wheezing or shortness of breath. 1 Inhaler 0   amLODipine (NORVASC) 5 MG tablet TAKE 1 TABLET (5 MG TOTAL) BY MOUTH DAILY. 90 tablet 1   ANORO ELLIPTA 62.5-25 MCG/INH AEPB 1 puff daily.     augmented betamethasone dipropionate (DIPROLENE-AF) 0.05 % cream Apply topically 2 (two) times daily.     azithromycin (ZITHROMAX Z-PAK) 250 MG tablet Take as directed 6 each 0   benzonatate (TESSALON) 100 MG capsule Take 1 capsule (100 mg total) by mouth 3 (three) times daily as needed. 30 capsule 2   [employer] ASPIRIN LOW DOSE 81 MG tablet TAKE 1 TABLET (81 MG TOTAL) BY MOUTH DAILY. SWALLOW WHOLE. 90 tablet 3   cyclobenzaprine (FLEXERIL) 10 MG tablet Take 10 mg by mouth 2 (two) times daily as needed for muscle spasms.     dexamethasone (DECADRON) 1 MG tablet Take 1 tablet (1 mg total) by mouth daily with breakfast. 60 tablet 1   docusate sodium (COLACE) 100 MG capsule Take 100 mg by mouth daily.     DULoxetine (CYMBALTA) 20 MG capsule Take 1 capsule (20 mg total) by mouth 2 (two) times daily. 60 capsule 5   fluticasone furoate-vilanterol (BREO ELLIPTA) 100-25 MCG/INH AEPB Inhale 1 puff into the lungs daily. 30 each 3   HYDROcodone bit-homatropine (HYCODAN) 5-1.5 MG/5ML syrup Take 5 mLs by mouth every 6 (six) hours as needed for cough. 473 mL 0   hydrOXYzine (ATARAX) 25 MG tablet Take 25 mg by mouth at bedtime.     levothyroxine (SYNTHROID) 100 MCG tablet TAKE 1 TABLET BY MOUTH EVERY DAY 90 tablet 1   lidocaine-prilocaine (EMLA) cream Apply 1 application topically as needed. 30 g 0   omeprazole  (PRILOSEC) 40 MG capsule Take 1 capsule (40 mg total) by mouth daily. 90 capsule 4   ondansetron (ZOFRAN) 4 MG tablet Take 1 tablet (4 mg total) by mouth every 8 (eight) hours as needed. 40 tablet 2   oxyCODONE-acetaminophen (PERCOCET/ROXICET) 5-325 MG tablet Take 1 tablet by mouth every 4 (four) hours as needed for severe pain. 30 tablet 0   pembrolizumab (KEYTRUDA) 100 MG/4ML SOLN See admin instructions.     polyethylene glycol powder (MIRALAX) powder Take 17 g by mouth daily. 255 g 11   prochlorperazine (COMPAZINE) 10 MG tablet Take 1 tablet (10 mg total) by mouth every 6 (six) hours as needed. 30 tablet 2   temazepam (RESTORIL) 30 MG capsule Take 1 capsule (30 mg total) by mouth at bedtime as needed for sleep. 30 capsule 0   triamcinolone cream (KENALOG) 0.1 % APPLY TOPICALLY 2 TIMES DAILY AS NEEDED. 454  g 0   TRULANCE 3 MG TABS Take 1 tablet by mouth daily.     No current facility-administered medications for this visit.    SURGICAL HISTORY:  Past Surgical History:  Procedure Laterality Date   arm surgery Right    BRONCHIAL BRUSHINGS  10/24/2019   Procedure: BRONCHIAL BRUSHINGS;  Surgeon: Leslye Peer, MD;  Location: [facility] ENDOSCOPY;  Service: Cardiopulmonary;;  right lower lobe    BRONCHIAL BRUSHINGS  11/05/2019   Procedure: BRONCHIAL BRUSHINGS;  Surgeon: Leslye Peer, MD;  Location: [facility] ENDOSCOPY;  Service: Pulmonary;;   BRONCHIAL NEEDLE ASPIRATION BIOPSY  10/24/2019   Procedure: BRONCHIAL NEEDLE ASPIRATION BIOPSIES;  Surgeon: Leslye Peer, MD;  Location: [facility] ENDOSCOPY;  Service: Cardiopulmonary;;   BRONCHIAL NEEDLE ASPIRATION BIOPSY  11/05/2019   Procedure: BRONCHIAL NEEDLE ASPIRATION BIOPSIES;  Surgeon: Leslye Peer, MD;  Location: [facility] ENDOSCOPY;  Service: Pulmonary;;   ENDOBRONCHIAL ULTRASOUND N/A 10/24/2019   Procedure: ENDOBRONCHIAL ULTRASOUND;  Surgeon: Leslye Peer, MD;  Location: [facility] ENDOSCOPY;  Service: Cardiopulmonary;  Laterality: N/A;   FINGER SURGERY Right    Middle    IR IMAGING GUIDED PORT INSERTION  11/19/2019   VIDEO BRONCHOSCOPY N/A 10/24/2019   Procedure: VIDEO BRONCHOSCOPY WITHOUT FLUORO;  Surgeon: Leslye Peer, MD;  Location: [facility] ENDOSCOPY;  Service: Cardiopulmonary;  Laterality: N/A;   VIDEO BRONCHOSCOPY WITH ENDOBRONCHIAL NAVIGATION N/A 11/05/2019   Procedure: VIDEO BRONCHOSCOPY WITH ENDOBRONCHIAL NAVIGATION;  Surgeon: Leslye Peer, MD;  Location: [facility] ENDOSCOPY;  Service: Pulmonary;  Laterality: N/A;    REVIEW OF SYSTEMS:   Constitutional: Positive for fatigue and weight loss. Positive for appetite change. Negative for chills and fever.  HENT: Negative for mouth sores, nosebleeds, sore throat and trouble swallowing.   Eyes: Positive for vision changes.  Negative for eye problems and icterus.  Respiratory: Stable shortness of breath with exertion and improving cough.  Negative for hemoptysis, and wheezing.   Cardiovascular: Improved  intermittent right lower extremity swelling and right arm swelling.  Negative for chest pain. Gastrointestinal: Positive for increased bloating after eating. Negative for constipation, diarrhea, nausea and vomiting.  Genitourinary: Negative for bladder incontinence, difficulty urinating, dysuria, frequency and hematuria.   Musculoskeletal: Negative for back pain, gait problem, neck pain and neck stiffness.  Skin: Positive for occasional rashes and itching.  Neurological: Positive for stable balance changes and gait disturbances.  Negative for extremity weakness, headaches, light-headedness and seizures.  Hematological: Negative for adenopathy. Does not bruise/bleed easily.  Psychiatric/Behavioral: Negative for confusion, depression and sleep disturbance. The patient is not nervous/anxious.    PHYSICAL EXAMINATION:  Blood pressure 112/81, pulse (!) 110, temperature 97.8 F (36.6 C), temperature source Temporal, resp. rate 15, height 5\' 8"  (1.727 m), weight 177 lb 9.6 oz (80.6 kg), SpO2 100%.  ECOG PERFORMANCE  STATUS: 1  Physical Exam  Constitutional: Oriented to person, place, and time and well-developed, well-nourished, and in no distress.  HENT:  Head: Normocephalic and atraumatic.  Mouth/Throat: Oropharynx is clear and moist. No oropharyngeal exudate.  Eyes: Conjunctivae are normal. Right eye exhibits no discharge. Left eye exhibits no discharge. No scleral icterus.  Neck: Normal range of motion. Neck supple.  Cardiovascular: Normal rate, regular rhythm, normal heart sounds and intact distal pulses.   Pulmonary/Chest: Effort normal and breath sounds normal except mild expiratory wheezing in the right lung. No respiratory distress. No wheezes. No rales.  Abdominal: Soft. Bowel sounds are normal. Exhibits no distension and no mass. There is no tenderness.  Musculoskeletal: Normal range of  motion. Exhibits no edema.  Lymphadenopathy:    No cervical adenopathy.  Neurological: Alert and oriented to person, place, and time. Exhibits normal muscle tone. Gait normal. Coordination normal.  Skin: Skin is warm and dry. No rash noted. Not diaphoretic. No erythema. No pallor.  Psychiatric: Mood, memory and judgment normal.  Vitals reviewed.  LABORATORY DATA: Lab Results  Component Value Date   WBC 7.5 03/22/2023   HGB 12.5 (L) 03/22/2023   HCT 39.2 03/22/2023   MCV 88.9 03/22/2023   PLT 209 03/22/2023      Chemistry      Component Value Date/Time   NA 135 03/02/2023 0850   NA 141 03/08/2017 1707   K 3.5 03/02/2023 0850   CL 104 03/02/2023 0850   CO2 24 03/02/2023 0850   BUN 6 (L) 03/02/2023 0850   BUN 12 03/08/2017 1707   CREATININE 0.93 03/02/2023 0850      Component Value Date/Time   CALCIUM 8.4 (L) 03/02/2023 0850   ALKPHOS 115 03/02/2023 0850   AST 13 (L) 03/02/2023 0850   ALT 11 03/02/2023 0850   BILITOT 0.4 03/02/2023 0850       RADIOGRAPHIC STUDIES:  MR BRAIN W WO CONTRAST  Result Date: 02/22/2023 CLINICAL DATA:  Metastatic lung cancer status post SRS in 2021 and  2022. EXAM: MRI HEAD WITHOUT AND WITH CONTRAST TECHNIQUE: Multiplanar, multiecho pulse sequences of the brain and surrounding structures were obtained without and with intravenous contrast. CONTRAST:  9 cc [employer]way COMPARISON:  Brain MRI 12/07/2022 FINDINGS: Brain: Numerous enhancing lesions are again seen. Stable or smaller lesions: *2.6 cm x 1.9 cm x 2.0 lesion in the left cerebellar hemisphere with intralesional hemorrhage is stable to slightly decreased in size from 2.9 cm x 2.0 cm x 2.2 cm on the prior study. Surrounding FLAIR signal abnormality extending to the middle cerebellar peduncle is not significantly changed. *Punctate lesion in the medial right occipital lobe is unchanged (13-84). *A 4 mm lesion in the right occipital lobe more posteriorly and superiorly is unchanged (13-91). *Curvilinear enhancement measuring 4 mm in the medial left occipital lobe is not significantly changed (13-82). *Curvilinear enhancement in the left occipital lobe more posteriorly and superiorly measuring 6 mm is unchanged (13-97). *An ill-defined lesion measuring 3 mm in the right temporal lobe is unchanged (13-87). *A 6 mm lesion in the right precentral gyrus is not significantly changed (13-137). *A 7 mm lesion in the left precentral gyrus at the vertex is unchanged (13-150). New or enlarging lesions: *Punctate lesion in the right parietal lobe not definitely present on the prior study (13-112). *There are no other new or enlarging lesions. *There is no significant edema surrounding the supratentorial lesions. There is no acute intracranial hemorrhage, extra-axial fluid collection, or acute infarct. Parenchymal volume is stable. The ventricles are stable in size. Background chronic small-vessel ischemic change is stable. The pituitary and suprasellar region are normal. There is no mass effect or midline shift. Vascular: Normal flow voids. Skull and upper cervical spine: Normal marrow signal. Sinuses/Orbits: The paranasal  sinuses are clear. The globes and orbits are unremarkable. Other: There is a trace left mastoid effusion. No obstructing nasopharyngeal mass is seen. IMPRESSION: 1. The largest enhancing lesion in the left cerebellar hemisphere is stable to slightly decreased in size since the study from 12/07/2022. Surrounding FLAIR signal abnormality is not significantly changed. 2. Single new punctate lesion in the right parietal lobe. 3. The numerous additional small enhancing lesions in the supratentorial brain detailed above are  not significantly changed. Electronically Signed   By: Lesia Hausen M.D.   On: 02/22/2023 16:44     ASSESSMENT/PLAN:  This is a very pleasant 62 year old Caucasian male diagnosed with stage IV non-small cell lung cancer, adenocarcinoma.  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 He was diagnosed in July 2021. His molecular studies by [facility] show he has MSI high which is a good marker for response to immunotherapy which will be important for future treatment.     The patient will complete SRS to the solidary brain metastasis under the care of Dr. [name] later today on 11/14/19.    He then underwent a course of weekly concurrent chemoradiation with carboplatin for an AUC of 2 and paclitaxel 45 mg per metered squared.  He tolerated this treatment well except for fatigue and mild odynophagia.    The patient showed evidence of disease progression with an increase in volume of the right infrahilar mass.  Since the patient has MSI high, the patient then underwent treatment with single agent immunotherapy with Keytruda 200 mg IV every 3 weeks.   The patient is status post 35 cycles and completed his 2 years of treatment on 03/23/22.  He also received 7 cycles of Avastin for vasogenic edema in the brain.  Avastin has been on hold unless needed again in the future.     He has SRS to an occipital brain lesion on  12/22/22 by Dr. [name]   He had evidence of progression on his surveillance imaging from July 2024. Therefore, he resumed immunotherapy with Keytruda. He is status post 6 cycles.    His last scan showed some disease progression and he underwent U HRT in the last day was on 03/03/2023.  He is also was found to have new metastatic disease to the brain and had SRS on 03/07/23    Labs were reviewed. Recommend that he proceed  with cycle #7 today as scheduled.    We will see him back for a follow up in 3 weeks for evaluation and repeat blood work before undergoing cycle #8.   I will arrange for restaging CT scan of the chest, abdomen, pelvis prior to his next appointment.  The patient was advised to eat small frequent meals given his bloating.  He will continue taking a PPI.  Could be related to radiation versus gastric irritation from his Decadron.   The patient has Kenalog cream and will take Zyrtec if he has any trouble with itching with his immunotherapy as he has in the past.    Will continue taking Decadron as advised by Dr. Barbaraann Cao.  He is currently taking 1 mg twice daily and will decrease to 1 mg daily once his balance improves.  The patient was advised to call immediately if he has any concerning symptoms in the interval. The patient voices understanding of current disease status and treatment options and is in agreement with the current care plan. All questions were answered. The patient knows to call the clinic with any problems, questions or concerns. We can certainly see the patient much sooner if necessary     Orders Placed This Encounter  Procedures   CT CHEST ABDOMEN PELVIS W CONTRAST    Standing Status:   Future    Standing Expiration Date:   03/21/2024    Order Specific Question:   If indicated for the ordered procedure, I authorize the administration of contrast  media per Radiology protocol    Answer:   Yes    Order Specific Question:   Does the patient have a contrast  media/X-ray dye allergy?    Answer:   No    Order Specific Question:   Preferred imaging location?    Answer:   [facility]    Order Specific Question:   If indicated for the ordered procedure, I authorize the administration of oral contrast media per Radiology protocol    Answer:   Yes    The total time spent in the appointment was 20-29 minutes  Shacoya Burkhammer L Namir Neto, PA-C 03/22/23

## 2023-03-17 ENCOUNTER — Telehealth: Payer: Self-pay | Admitting: Radiology

## 2023-03-17 NOTE — Telephone Encounter (Signed)
Pt stated he is feeling great, that the swelling in his rt arm and leg has subsided following radiation treatment. Pt stated he has been experiencing stomach cramps and has been in contact with his primary due to his stomach discomfort. We discussed his MRI that is scheduled for 06/01/2023 and his follow-up appt with Dr. Barbaraann Cao on 06/06/2023.

## 2023-03-20 ENCOUNTER — Encounter: Payer: Self-pay | Admitting: Internal Medicine

## 2023-03-22 ENCOUNTER — Inpatient Hospital Stay: Payer: Managed Care, Other (non HMO)

## 2023-03-22 ENCOUNTER — Inpatient Hospital Stay (HOSPITAL_BASED_OUTPATIENT_CLINIC_OR_DEPARTMENT_OTHER): Payer: Managed Care, Other (non HMO) | Admitting: Physician Assistant

## 2023-03-22 VITALS — HR 98

## 2023-03-22 VITALS — BP 112/81 | HR 110 | Temp 97.8°F | Resp 15 | Ht 68.0 in | Wt 177.6 lb

## 2023-03-22 DIAGNOSIS — C3491 Malignant neoplasm of unspecified part of right bronchus or lung: Secondary | ICD-10-CM | POA: Diagnosis not present

## 2023-03-22 DIAGNOSIS — C7931 Secondary malignant neoplasm of brain: Secondary | ICD-10-CM | POA: Diagnosis not present

## 2023-03-22 DIAGNOSIS — Z5112 Encounter for antineoplastic immunotherapy: Secondary | ICD-10-CM | POA: Diagnosis not present

## 2023-03-22 DIAGNOSIS — Z95828 Presence of other vascular implants and grafts: Secondary | ICD-10-CM

## 2023-03-22 LAB — CBC WITH DIFFERENTIAL (CANCER CENTER ONLY)
Abs Immature Granulocytes: 0.04 10*3/uL (ref 0.00–0.07)
Basophils Absolute: 0.1 10*3/uL (ref 0.0–0.1)
Basophils Relative: 1 %
Eosinophils Absolute: 0.1 10*3/uL (ref 0.0–0.5)
Eosinophils Relative: 1 %
HCT: 39.2 % (ref 39.0–52.0)
Hemoglobin: 12.5 g/dL — ABNORMAL LOW (ref 13.0–17.0)
Immature Granulocytes: 1 %
Lymphocytes Relative: 7 %
Lymphs Abs: 0.5 10*3/uL — ABNORMAL LOW (ref 0.7–4.0)
MCH: 28.3 pg (ref 26.0–34.0)
MCHC: 31.9 g/dL (ref 30.0–36.0)
MCV: 88.9 fL (ref 80.0–100.0)
Monocytes Absolute: 0.6 10*3/uL (ref 0.1–1.0)
Monocytes Relative: 8 %
Neutro Abs: 6.2 10*3/uL (ref 1.7–7.7)
Neutrophils Relative %: 82 %
Platelet Count: 209 10*3/uL (ref 150–400)
RBC: 4.41 MIL/uL (ref 4.22–5.81)
RDW: 17 % — ABNORMAL HIGH (ref 11.5–15.5)
WBC Count: 7.5 10*3/uL (ref 4.0–10.5)
nRBC: 0 % (ref 0.0–0.2)

## 2023-03-22 LAB — CMP (CANCER CENTER ONLY)
ALT: 19 U/L (ref 0–44)
AST: 22 U/L (ref 15–41)
Albumin: 4.1 g/dL (ref 3.5–5.0)
Alkaline Phosphatase: 100 U/L (ref 38–126)
Anion gap: 7 (ref 5–15)
BUN: 9 mg/dL (ref 8–23)
CO2: 24 mmol/L (ref 22–32)
Calcium: 9.4 mg/dL (ref 8.9–10.3)
Chloride: 101 mmol/L (ref 98–111)
Creatinine: 0.99 mg/dL (ref 0.61–1.24)
GFR, Estimated: >60 mL/min (ref >60)
Glucose, Bld: 130 mg/dL — ABNORMAL HIGH (ref 70–99)
Potassium: 4.4 mmol/L (ref 3.5–5.1)
Sodium: 132 mmol/L — ABNORMAL LOW (ref 135–145)
Total Bilirubin: 1.1 mg/dL (ref <1.2)
Total Protein: 7.2 g/dL (ref 6.5–8.1)

## 2023-03-22 MED ORDER — SODIUM CHLORIDE 0.9% FLUSH
10.0000 mL | INTRAVENOUS | Status: DC | PRN
  Administered 2023-03-22: 10 mL

## 2023-03-22 MED ORDER — SODIUM CHLORIDE 0.9 % IV SOLN
200.0000 mg | Freq: Once | INTRAVENOUS | Status: AC
  Administered 2023-03-22: 200 mg via INTRAVENOUS
  Filled 2023-03-22: qty 200

## 2023-03-22 MED ORDER — SODIUM CHLORIDE 0.9 % IV SOLN: Freq: Once | INTRAVENOUS | Status: AC

## 2023-03-27 ENCOUNTER — Telehealth: Payer: Self-pay | Admitting: Medical Oncology

## 2023-03-27 NOTE — Telephone Encounter (Signed)
Per Cordelia Pen, I told pt that "yes.the CT staff can access  your port just  tell him to ask [name] to use the [facility] ".

## 2023-03-27 NOTE — Telephone Encounter (Signed)
Use port for CT scan contrast -LEft message for Joseph Atkins to see if her staff can access his port on dec 11th

## 2023-03-30 ENCOUNTER — Encounter: Payer: Self-pay | Admitting: Internal Medicine

## 2023-03-30 ENCOUNTER — Encounter: Payer: Self-pay | Admitting: Physician Assistant

## 2023-04-05 ENCOUNTER — Ambulatory Visit (HOSPITAL_COMMUNITY)
Admission: RE | Admit: 2023-04-05 | Discharge: 2023-04-05 | Disposition: A | Discharge status: Home / Self Care | Payer: Managed Care, Other (non HMO) | Source: Ambulatory Visit | Attending: Physician Assistant | Admitting: Physician Assistant

## 2023-04-05 DIAGNOSIS — C3491 Malignant neoplasm of unspecified part of right bronchus or lung: Secondary | ICD-10-CM | POA: Insufficient documentation

## 2023-04-05 MED ORDER — HEPARIN SOD (PORK) LOCK FLUSH 100 UNIT/ML IV SOLN
INTRAVENOUS | Status: AC
Start: 2023-04-05 — End: ?
  Filled 2023-04-05: qty 5

## 2023-04-05 MED ORDER — IOHEXOL 300 MG/ML  SOLN
75.0000 mL | Freq: Once | INTRAMUSCULAR | Status: AC | PRN
  Administered 2023-04-05: 75 mL via INTRAVENOUS

## 2023-04-08 NOTE — Progress Notes (Unsigned)
[facility] OFFICE PROGRESS NOTE  Joseph Chesterfield, NP [address] [name] [address] [place] [address]  DIAGNOSIS: Stage IV (T3, N0, M1C) non-small cell lung cancer, adenocarcinoma.  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 He was diagnosed in July 2021.   Molecular Biomarkers:  MSI-High DETECTED Pembrolizumab Atezolizumab, Avelumab, Cemiplimab, Dostarlimab, Durvalumab, Ipilimumab, Nivolumab   STK11Splice Site SNV 1.9% Everolimus, Temsirolimus Yes   [id number] 1.7% Binimetinib Yes   [id number] 0.4%   Niraparib, Olaparib, Rucaparib, Talazoparib, Tazemetostat Yes  PRIOR THERAPY: 1) SRS to the solitary brain metastasis under the care of Dr. [name]. Last treatment 11/14/19. 2) Weekly concurrent chemoradiation with carboplatin for an AUC of 2, paclitaxel 45 mg/m2.  First dose expected on 11/25/2019. Status post 7 cycles, last dose was given 01/06/2020 with partial response.  3)  Immunotherapy with Keytruda 200 mg IV every 3 weeks.  First dose February 10, 2020 for a patient with MSI high.  Status post 35  cycles. 4) Avastin 15 mg/KG every 3 weeks.  First dose today for the vasogenic edema of the brain.S/P 7 cycles. 5) The patient had evidence for local disease recurrence in July 2024. 6) SRS to the occipital brain lesion under the care of Dr. [name] on 12/22/22 7) 2) UHRT under the care of Dr. [name], last dose expected on 03/03/23 8) SRS to the new metastatic brain lesion on 03/07/23  CURRENT THERAPY: 1) Resuming his treatment with Keytruda 200 Mg IV every 3 weeks. First dose 11/16/2022. Status post 7 cycles   INTERVAL HISTORY: Joseph Atkins 62 y.o. male returns to the clinic today for a follow up visit accompanied by his wife. The patient recently had evidence of disease progression with increased activity of the central aspect of the right lower lobe concerning for local disease  recurrence. Therefore, he resumed immunotherapy with [name]. He tolerates this well without any adverse side effects except in the past he has had pruritic nodular lesions from [facility] for which he would use kenalog cream and zyrtec. He had a restaging CT scan at his last appointment which showed enlarging lung lesion. Therefore, he was re-referred to radiation oncology and recently completed UHRT in November 2024.   His main concern today is related to right thigh pain. It radiates from his right up to his knee. He denies traumas or injuries. He just woke up with the pain one day. He characterizes it as throbbing. He has had left sided sciatica in the past and this feels different. He has been using a heating pad which has been helping. He is supposed to see his PCP tomorrow regarding this. He denies any overlying skin changes, swelling, or masses.   He is followed closely by Dr. Barbaraann Cao for his metastatic disease to the brain.   Otherwise, he is doing fair. His cough is improving. I gave him hycodan last month. Ever since undergoing radiation, he sometimes has right posterior thoracic discomfort described as feeling tight. He denies pain. Denies any associated fevers, chills, night sweats, hemoptysis, or signs of infection. Denies changes in his shortness of breath. At prior appointments, he was having some swelling on the right upper and lower extremity swelling which has since improved over the last few weeks. He gained some weight since last being seen.  Denies any diarrhea or constipation at this time. Denies any new rashes or skin changes. His wife brought in disability paperwork. He  recently had a restaging CT scan.  He is here today for evaluation and to review his scan results before undergoing cycle #8  MEDICAL HISTORY: Past Medical History:  Diagnosis Date   Cancer (HCC)    lung cancer   Diverticulosis    GERD (gastroesophageal reflux disease)    Hx of small bowel obstruction     Hyperlipidemia    Hypertension    Hypothyroidism    IBS (irritable bowel syndrome)    Substance abuse (HCC)    Alcoholic, Drug addition   Thyroid disease     ALLERGIES:  is allergic to penicillins.  MEDICATIONS:  Current Outpatient Medications  Medication Sig Dispense Refill   albuterol (PROVENTIL HFA;VENTOLIN HFA) 108 (90 Base) MCG/ACT inhaler Inhale 2 puffs into the lungs every 6 (six) hours as needed for wheezing or shortness of breath. 1 Inhaler 0   amLODipine (NORVASC) 5 MG tablet TAKE 1 TABLET (5 MG TOTAL) BY MOUTH DAILY. 90 tablet 1   ANORO ELLIPTA 62.5-25 MCG/INH AEPB 1 puff daily.     augmented betamethasone dipropionate (DIPROLENE-AF) 0.05 % cream Apply topically 2 (two) times daily.     azithromycin (ZITHROMAX Z-PAK) 250 MG tablet Take as directed 6 each 0   benzonatate (TESSALON) 100 MG capsule Take 1 capsule (100 mg total) by mouth 3 (three) times daily as needed. 30 capsule 2   [employer] ASPIRIN LOW DOSE 81 MG tablet TAKE 1 TABLET (81 MG TOTAL) BY MOUTH DAILY. SWALLOW WHOLE. 90 tablet 3   cyclobenzaprine (FLEXERIL) 10 MG tablet Take 10 mg by mouth 2 (two) times daily as needed for muscle spasms.     dexamethasone (DECADRON) 1 MG tablet Take 1 tablet (1 mg total) by mouth daily with breakfast. 60 tablet 1   docusate sodium (COLACE) 100 MG capsule Take 100 mg by mouth daily.     DULoxetine (CYMBALTA) 20 MG capsule Take 1 capsule (20 mg total) by mouth 2 (two) times daily. 60 capsule 5   fluticasone furoate-vilanterol (BREO ELLIPTA) 100-25 MCG/INH AEPB Inhale 1 puff into the lungs daily. 30 each 3   HYDROcodone bit-homatropine (HYCODAN) 5-1.5 MG/5ML syrup Take 5 mLs by mouth every 6 (six) hours as needed for cough. 473 mL 0   hydrOXYzine (ATARAX) 25 MG tablet Take 25 mg by mouth at bedtime.     levothyroxine (SYNTHROID) 100 MCG tablet TAKE 1 TABLET BY MOUTH EVERY DAY 90 tablet 1   lidocaine-prilocaine (EMLA) cream Apply 1 application topically as needed. 30 g 0   omeprazole  (PRILOSEC) 40 MG capsule Take 1 capsule (40 mg total) by mouth daily. 90 capsule 4   ondansetron (ZOFRAN) 4 MG tablet Take 1 tablet (4 mg total) by mouth every 8 (eight) hours as needed. 40 tablet 2   oxyCODONE-acetaminophen (PERCOCET/ROXICET) 5-325 MG tablet Take 1 tablet by mouth every 4 (four) hours as needed for severe pain. 30 tablet 0   pembrolizumab (KEYTRUDA) 100 MG/4ML SOLN See admin instructions.     polyethylene glycol powder (MIRALAX) powder Take 17 g by mouth daily. 255 g 11   prochlorperazine (COMPAZINE) 10 MG tablet Take 1 tablet (10 mg total) by mouth every 6 (six) hours as needed. 30 tablet 2   temazepam (RESTORIL) 30 MG capsule Take 1 capsule (30 mg total) by mouth at bedtime as needed for sleep. 30 capsule 0   triamcinolone cream (KENALOG) 0.1 % APPLY TOPICALLY 2 TIMES DAILY AS NEEDED. 454 g 0   TRULANCE 3 MG TABS Take 1 tablet by  mouth daily.     No current facility-administered medications for this visit.   Facility-Administered Medications Ordered in Other Visits  Medication Dose Route Frequency Provider Last Rate Last Admin   0.9 %  sodium chloride infusion   Intravenous Once Si Gaul, MD       pembrolizumab [facility]) 200 mg in sodium chloride 0.9 % 50 mL chemo infusion  200 mg Intravenous Once Si Gaul, MD        SURGICAL HISTORY:  Past Surgical History:  Procedure Laterality Date   arm surgery Right    BRONCHIAL BRUSHINGS  10/24/2019   Procedure: BRONCHIAL BRUSHINGS;  Surgeon: Leslye Peer, MD;  Location: [facility] ENDOSCOPY;  Service: Cardiopulmonary;;  right lower lobe    BRONCHIAL BRUSHINGS  11/05/2019   Procedure: BRONCHIAL BRUSHINGS;  Surgeon: Leslye Peer, MD;  Location: [facility] ENDOSCOPY;  Service: Pulmonary;;   BRONCHIAL NEEDLE ASPIRATION BIOPSY  10/24/2019   Procedure: BRONCHIAL NEEDLE ASPIRATION BIOPSIES;  Surgeon: Leslye Peer, MD;  Location: [facility] ENDOSCOPY;  Service: Cardiopulmonary;;   BRONCHIAL NEEDLE ASPIRATION BIOPSY  11/05/2019    Procedure: BRONCHIAL NEEDLE ASPIRATION BIOPSIES;  Surgeon: Leslye Peer, MD;  Location: [facility] ENDOSCOPY;  Service: Pulmonary;;   ENDOBRONCHIAL ULTRASOUND N/A 10/24/2019   Procedure: ENDOBRONCHIAL ULTRASOUND;  Surgeon: Leslye Peer, MD;  Location: [facility] ENDOSCOPY;  Service: Cardiopulmonary;  Laterality: N/A;   FINGER SURGERY Right    Middle   IR IMAGING GUIDED PORT INSERTION  11/19/2019   VIDEO BRONCHOSCOPY N/A 10/24/2019   Procedure: VIDEO BRONCHOSCOPY WITHOUT FLUORO;  Surgeon: Leslye Peer, MD;  Location: [facility] ENDOSCOPY;  Service: Cardiopulmonary;  Laterality: N/A;   VIDEO BRONCHOSCOPY WITH ENDOBRONCHIAL NAVIGATION N/A 11/05/2019   Procedure: VIDEO BRONCHOSCOPY WITH ENDOBRONCHIAL NAVIGATION;  Surgeon: Leslye Peer, MD;  Location: [facility] ENDOSCOPY;  Service: Pulmonary;  Laterality: N/A;    REVIEW OF SYSTEMS:   Constitutional: Positive for fatigue. Negative for chills and fever.  HENT: Negative for mouth sores, nosebleeds, sore throat and trouble swallowing.   Eyes: Positive for vision changes.  Negative for eye problems and icterus.  Respiratory: Stable shortness of breath with exertion and improving cough.  Negative for hemoptysis, and wheezing.   Cardiovascular: Improved right lower extremity swelling and right arm swelling.  Negative for chest pain. Gastrointestinal: Negative for constipation, diarrhea, nausea and vomiting.  Genitourinary: Negative for bladder incontinence, difficulty urinating, dysuria, frequency and hematuria.   Musculoskeletal: Positive for right thigh pain. Negative for back pain, gait problem, neck pain and neck stiffness.  Skin: Positive for occasional rashes and itching.  Neurological: Positive for stable balance changes and gait disturbances.  Negative for extremity weakness, headaches, light-headedness and seizures.  Hematological: Negative for adenopathy. Does not bruise/bleed easily.  Psychiatric/Behavioral: Negative for confusion, depression and sleep disturbance.  The patient is not nervous/anxious.    PHYSICAL EXAMINATION:  Blood pressure (!) 142/76, pulse 95, temperature 97.8 F (36.6 C), temperature source Temporal, resp. rate 16, weight 181 lb 4.8 oz (82.2 kg), SpO2 100%.  ECOG PERFORMANCE STATUS: 1  Physical Exam  Constitutional: Oriented to person, place, and time and well-developed, well-nourished, and in no distress. Marland Kitchen  HENT:  Head: Normocephalic and atraumatic.  Mouth/Throat: Oropharynx is clear and moist. No oropharyngeal exudate.  Eyes: Conjunctivae are normal. Right eye exhibits no discharge. Left eye exhibits no discharge. No scleral icterus.  Neck: Normal range of motion. Neck supple.  Cardiovascular: Normal rate, regular rhythm, normal heart sounds and intact distal pulses.   Pulmonary/Chest: Effort normal and breath sounds  normal. No respiratory distress. No wheezes. No rales.  Abdominal: Soft. Bowel sounds are normal. Exhibits no distension and no mass. There is no tenderness.  Musculoskeletal: Normal range of motion. Exhibits no edema.  Lymphadenopathy:    No cervical adenopathy.  Neurological: Alert and oriented to person, place, and time. Exhibits normal muscle tone. Gait normal. Coordination normal.  Skin: Skin is warm and dry. No rash noted. Not diaphoretic. No erythema. No pallor.  Psychiatric: Mood, memory and judgment normal.  Vitals reviewed.  LABORATORY DATA: Lab Results  Component Value Date   WBC 6.6 04/13/2023   HGB 12.0 (L) 04/13/2023   HCT 36.1 (L) 04/13/2023   MCV 88.5 04/13/2023   PLT 191 04/13/2023      Chemistry      Component Value Date/Time   NA 136 04/13/2023 1023   NA 141 03/08/2017 1707   K 4.3 04/13/2023 1023   CL 104 04/13/2023 1023   CO2 26 04/13/2023 1023   BUN 6 (L) 04/13/2023 1023   BUN 12 03/08/2017 1707   CREATININE 0.92 04/13/2023 1023      Component Value Date/Time   CALCIUM 9.0 04/13/2023 1023   ALKPHOS 78 04/13/2023 1023   AST 19 04/13/2023 1023   ALT 19 04/13/2023 1023    BILITOT 0.6 04/13/2023 1023       RADIOGRAPHIC STUDIES:  CT CHEST ABDOMEN PELVIS W CONTRAST Result Date: 04/06/2023 CLINICAL DATA:  Adenocarcinoma right lung stage IV * Tracking Code: BO * EXAM: CT CHEST, ABDOMEN, AND PELVIS WITH CONTRAST TECHNIQUE: Multidetector CT imaging of the chest, abdomen and pelvis was performed following the standard protocol during bolus administration of intravenous contrast. RADIATION DOSE REDUCTION: This exam was performed according to the departmental dose-optimization program which includes automated exposure control, adjustment of the mA and/or kV according to patient size and/or use of iterative reconstruction technique. CONTRAST:  75mL OMNIPAQUE IOHEXOL 300 MG/ML  SOLN COMPARISON:  01/06/2023.  Older exams as well. FINDINGS: CT CHEST FINDINGS Cardiovascular: Heart is nonenlarged. Moderate pericardial effusion is stable. Coronary artery calcifications are seen. There also calcifications in the area of the aortic valve. The thoracic aorta has a normal course and caliber with scattered atherosclerotic mild partially calcified plaque. Right IJ chest port is accessed and has a tip extending to the SVC right atrial junction region. Mediastinum/Nodes: Small thyroid gland. Normal caliber thoracic esophagus. No specific abnormal lymph node enlargement identified in the axillary region, hilum or mediastinum. Lungs/Pleura: Right lower lobe mass again identified abutting the hilum with some marginal calcification. Previously measuring 4.5 x 4.0 x 3.8 cm on the prior examination and today 4.2 x 3.8 cm in the axial plane and cephalocaudal 3.6 cm. The adjacent parenchymal ill-defined opacity that is confluence with distortion and irregularity is stable. Associated pleural thickening and tiny right effusion has increased. The small peripheral nodule in the right lower lobe seen previously as well is stable today on series 5, image 100 measuring 5 mm. Centrilobular emphysematous changes  are seen. No pneumothorax. No left-sided consolidation. No new dominant lung mass. Musculoskeletal: Scattered mild degenerative changes along the spine. Gynecomastia CT ABDOMEN PELVIS FINDINGS Hepatobiliary: No focal liver abnormality is seen. No gallstones, gallbladder wall thickening, or biliary dilatation. Patent portal vein. Pancreas: Unremarkable. No pancreatic ductal dilatation or surrounding inflammatory changes. Spleen: Normal in size without focal abnormality. Adrenals/Urinary Tract: Adrenal glands are unremarkable. Kidneys are normal, without renal calculi, focal lesion, or hydronephrosis. Bladder is unremarkable. Stomach/Bowel: Moderate debris in the stomach. On this non oral  contrast exam, the small and large bowel are nondilated. Diffuse large amount of colonic stool. Normal appendix. Vascular/Lymphatic: Aortic atherosclerosis. Occlusion of the left common iliac vessels is again noted. Reconstitution distally of the left external and common femoral artery with significant calcified plaque. Please correlate with symptoms no enlarged abdominal or pelvic lymph nodes. Retroaortic left renal vein. Reproductive: Prostate is unremarkable. Other: No abdominal wall hernia or abnormality. No abdominopelvic ascites. Musculoskeletal: Mild degenerative changes along the spine and pelvis. IMPRESSION: Right-sided lower lobe perihilar lung mass again seen and minimally decreased today compared to previous. In the adjacent parenchymal opacity and pneumonitis is similar. Slight increase in tiny right effusion. Stable separate tiny right lower lobe nodule. Attention on follow-up. No developing new lymph node enlargement. Stable moderate pericardial effusion. No bowel obstruction. No free air or free fluid. Diffuse colonic stool. 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 Please correlate with symptoms and history Electronically Signed   By: Karen Kays M.D.   On: 04/06/2023 17:26     ASSESSMENT/PLAN:  This is a very pleasant 62 year old Caucasian male diagnosed with stage IV non-small cell lung cancer, adenocarcinoma.  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 He was diagnosed in July 2021. His molecular studies by [facility] show he has MSI high which is a good marker for response to immunotherapy which will be important for future treatment.     The patient will complete SRS to the solidary brain metastasis under the care of Dr. [name] later today on 11/14/19.    He then underwent a course of weekly concurrent chemoradiation with carboplatin for an AUC of 2 and paclitaxel 45 mg per metered squared.  He tolerated this treatment well except for fatigue and mild odynophagia.    The patient showed evidence of disease progression with an increase in volume of the right infrahilar mass.  Since the patient has MSI high, the patient then underwent treatment with single agent immunotherapy with Keytruda 200 mg IV every 3 weeks.   The patient is status post 35 cycles and completed his 2 years of treatment on 03/23/22.  He also received 7 cycles of Avastin for vasogenic edema in the brain.  Avastin has been on hold unless needed again in the future.    He has SRS to an occipital brain lesion on 12/22/22 by Dr. [name]   He had evidence of progression on his surveillance imaging from July 2024. Therefore, he resumed immunotherapy with Keytruda. He is status post 7 cycles.    His last scan showed some disease progression and he underwent U HRT in the last day was on 03/03/2023.  He is also was found to have new metastatic disease to the brain and had SRS on 03/07/23.  The patient was seen with Dr. Arbutus Ped today.  Dr. Arbutus Ped personally and independently reviewed the scan and discussed results with the patient today.  The scan showed no evidence of disease progression.  Dr.  Arbutus Ped recommends he continue on the same treatment at the same dose.   Labs were reviewed. Recommend that he proceed  with cycle #7 today as scheduled.    We will see him back for a follow up in 3 weeks for evaluation and repeat blood work before undergoing cycle #9.    We discussed possible xray today. However, the patient is going to see his PCP tomorrow  for his right thigh pain. Therefore, he will wait to see their recommendation and possible imaging studies. Of course, if found to have any cancer related concerns, he will reach out. His scan did not show any new concerns in the back or pelvis. He has some arthritis in his pelvis and spine.   The patient gave [name] paperwork for disability. We will get this to the [facility] department.   The patient was advised to call immediately if he has any concerning symptoms in the interval. The patient voices understanding of current disease status and treatment options and is in agreement with the current care plan. All questions were answered. The patient knows to call the clinic with any problems, questions or concerns. We can certainly see the patient much sooner if necessary     No orders of the defined types were placed in this encounter.   Eiko Mcgowen L Jondavid Schreier, PA-C 04/13/23  ADDENDUM: Hematology/Oncology Attending: I had a face-to-face encounter with the patient today.  I reviewed his record, lab, scan and recommended his care plan.  This is a very pleasant 63 years old white male with stage IV non-small cell lung cancer, adenocarcinoma that was initially diagnosed in July 2021 with MSI high.  The patient is status post SRS to multiple brain metastasis.  He was also initially treated with a course of concurrent chemoradiation with weekly carboplatin and paclitaxel then 2 years treatment with single agent Keytruda followed by observation.  He had evidence for disease recurrence and the patient resumed his treatment with Rande Lawman on November 13, 2022 status post 7 cycles.  He has been tolerating this treatment well with no concerning adverse effects except for some pruritic nodular lesion.  He has some pain on the right side recently after around movement.  He is scheduled to see his primary care tomorrow for evaluation and management of suspicious sciatica. The patient had repeat CT scan of the chest, abdomen and pelvis performed recently.  I personally and independently reviewed the scan and discussed the result with the patient and his wife. His scan showed no concerning findings for disease progression and there was some mild improvement. I recommended for him to continue his current treatment with [facility] and he will proceed with cycle #8 today. He will come back for follow-up visit in 3 weeks for evaluation before starting cycle #9. The patient was advised to call immediately if he has any other concerning symptoms in the interval. The total time spent in the appointment was 30 minutes. Disclaimer: This note was dictated with voice recognition software. Similar sounding words can inadvertently be transcribed and may be missed upon review. Lajuana Matte, MD

## 2023-04-10 ENCOUNTER — Ambulatory Visit
Admission: RE | Admit: 2023-04-10 | Discharge: 2023-04-10 | Disposition: A | Discharge status: Home / Self Care | Payer: Managed Care, Other (non HMO) | Source: Ambulatory Visit | Attending: Radiation Oncology | Admitting: Radiation Oncology

## 2023-04-10 DIAGNOSIS — C7931 Secondary malignant neoplasm of brain: Secondary | ICD-10-CM | POA: Insufficient documentation

## 2023-04-10 DIAGNOSIS — Z510 Encounter for antineoplastic radiation therapy: Secondary | ICD-10-CM | POA: Insufficient documentation

## 2023-04-10 DIAGNOSIS — C3431 Malignant neoplasm of lower lobe, right bronchus or lung: Secondary | ICD-10-CM | POA: Insufficient documentation

## 2023-04-10 NOTE — Progress Notes (Signed)
  Radiation Oncology         [phone]) [phone] ________________________________  Name: Joseph Atkins MRN: [id number]  Date of Service: 04/10/2023  DOB: 1960/08/03  Post Treatment Telephone Note  Diagnosis:  Stage IV, cT3, N0, M1c, non-small cell lung cancer, adenocarcinoma of the right lower lobe with locally recurrent disease (as documented in provider EOT note)  The patient was available for call today.  The patient did  note fatigue during radiation. The patient did not note hair loss or skin changes in the field of radiation during therapy. The patient is taking dexamethasone and due to be finished early January 2025. The patient does not have symptoms of  weakness or loss of control of the extremities. The patient does not have symptoms of headache. The patient does not have symptoms of seizure or uncontrolled movement. The patient does not have symptoms of changes in vision. The patient does not have changes in speech. The patient does not have confusion.   The patient was counseled that he  will be contacted by our brain and spine navigator to schedule surveillance imaging. The patient was encouraged to call if he  have not received a call to schedule imaging, or if he  develops concerns or questions regarding radiation. The patient will also continue to follow up with Dr. Barbaraann Cao in medical oncology.   This concludes the interaction.  [name], LPN

## 2023-04-13 ENCOUNTER — Inpatient Hospital Stay: Payer: Managed Care, Other (non HMO)

## 2023-04-13 ENCOUNTER — Inpatient Hospital Stay: Payer: Managed Care, Other (non HMO) | Admitting: Physician Assistant

## 2023-04-13 ENCOUNTER — Inpatient Hospital Stay: Payer: Managed Care, Other (non HMO) | Attending: Internal Medicine

## 2023-04-13 VITALS — BP 142/76 | HR 95 | Temp 97.8°F | Resp 16 | Wt 181.3 lb

## 2023-04-13 DIAGNOSIS — E039 Hypothyroidism, unspecified: Secondary | ICD-10-CM | POA: Insufficient documentation

## 2023-04-13 DIAGNOSIS — I3139 Other pericardial effusion (noninflammatory): Secondary | ICD-10-CM | POA: Insufficient documentation

## 2023-04-13 DIAGNOSIS — M79651 Pain in right thigh: Secondary | ICD-10-CM | POA: Insufficient documentation

## 2023-04-13 DIAGNOSIS — E079 Disorder of thyroid, unspecified: Secondary | ICD-10-CM | POA: Insufficient documentation

## 2023-04-13 DIAGNOSIS — C3491 Malignant neoplasm of unspecified part of right bronchus or lung: Secondary | ICD-10-CM

## 2023-04-13 DIAGNOSIS — Z79899 Other long term (current) drug therapy: Secondary | ICD-10-CM | POA: Diagnosis not present

## 2023-04-13 DIAGNOSIS — N62 Hypertrophy of breast: Secondary | ICD-10-CM | POA: Insufficient documentation

## 2023-04-13 DIAGNOSIS — Z7982 Long term (current) use of aspirin: Secondary | ICD-10-CM | POA: Diagnosis not present

## 2023-04-13 DIAGNOSIS — Z7951 Long term (current) use of inhaled steroids: Secondary | ICD-10-CM | POA: Diagnosis not present

## 2023-04-13 DIAGNOSIS — Z7952 Long term (current) use of systemic steroids: Secondary | ICD-10-CM | POA: Diagnosis not present

## 2023-04-13 DIAGNOSIS — K589 Irritable bowel syndrome without diarrhea: Secondary | ICD-10-CM | POA: Insufficient documentation

## 2023-04-13 DIAGNOSIS — C7931 Secondary malignant neoplasm of brain: Secondary | ICD-10-CM | POA: Diagnosis present

## 2023-04-13 DIAGNOSIS — I700 Atherosclerosis of aorta: Secondary | ICD-10-CM | POA: Diagnosis not present

## 2023-04-13 DIAGNOSIS — K219 Gastro-esophageal reflux disease without esophagitis: Secondary | ICD-10-CM | POA: Diagnosis not present

## 2023-04-13 DIAGNOSIS — F1010 Alcohol abuse, uncomplicated: Secondary | ICD-10-CM | POA: Insufficient documentation

## 2023-04-13 DIAGNOSIS — C3431 Malignant neoplasm of lower lobe, right bronchus or lung: Secondary | ICD-10-CM | POA: Insufficient documentation

## 2023-04-13 DIAGNOSIS — Z5112 Encounter for antineoplastic immunotherapy: Secondary | ICD-10-CM | POA: Diagnosis not present

## 2023-04-13 DIAGNOSIS — Z923 Personal history of irradiation: Secondary | ICD-10-CM | POA: Insufficient documentation

## 2023-04-13 DIAGNOSIS — J432 Centrilobular emphysema: Secondary | ICD-10-CM | POA: Insufficient documentation

## 2023-04-13 DIAGNOSIS — Z79890 Hormone replacement therapy (postmenopausal): Secondary | ICD-10-CM | POA: Insufficient documentation

## 2023-04-13 DIAGNOSIS — I10 Essential (primary) hypertension: Secondary | ICD-10-CM | POA: Insufficient documentation

## 2023-04-13 DIAGNOSIS — E785 Hyperlipidemia, unspecified: Secondary | ICD-10-CM | POA: Insufficient documentation

## 2023-04-13 DIAGNOSIS — Z95828 Presence of other vascular implants and grafts: Secondary | ICD-10-CM

## 2023-04-13 LAB — CBC WITH DIFFERENTIAL (CANCER CENTER ONLY)
Abs Immature Granulocytes: 0.03 10*3/uL (ref 0.00–0.07)
Basophils Absolute: 0.1 10*3/uL (ref 0.0–0.1)
Basophils Relative: 1 %
Eosinophils Absolute: 0 10*3/uL (ref 0.0–0.5)
Eosinophils Relative: 0 %
HCT: 36.1 % — ABNORMAL LOW (ref 39.0–52.0)
Hemoglobin: 12 g/dL — ABNORMAL LOW (ref 13.0–17.0)
Immature Granulocytes: 1 %
Lymphocytes Relative: 9 %
Lymphs Abs: 0.6 10*3/uL — ABNORMAL LOW (ref 0.7–4.0)
MCH: 29.4 pg (ref 26.0–34.0)
MCHC: 33.2 g/dL (ref 30.0–36.0)
MCV: 88.5 fL (ref 80.0–100.0)
Monocytes Absolute: 0.5 10*3/uL (ref 0.1–1.0)
Monocytes Relative: 8 %
Neutro Abs: 5.4 10*3/uL (ref 1.7–7.7)
Neutrophils Relative %: 81 %
Platelet Count: 191 10*3/uL (ref 150–400)
RBC: 4.08 MIL/uL — ABNORMAL LOW (ref 4.22–5.81)
RDW: 16.1 % — ABNORMAL HIGH (ref 11.5–15.5)
WBC Count: 6.6 10*3/uL (ref 4.0–10.5)
nRBC: 0 % (ref 0.0–0.2)

## 2023-04-13 LAB — CMP (CANCER CENTER ONLY)
ALT: 19 U/L (ref 0–44)
AST: 19 U/L (ref 15–41)
Albumin: 4 g/dL (ref 3.5–5.0)
Alkaline Phosphatase: 78 U/L (ref 38–126)
Anion gap: 6 (ref 5–15)
BUN: 6 mg/dL — ABNORMAL LOW (ref 8–23)
CO2: 26 mmol/L (ref 22–32)
Calcium: 9 mg/dL (ref 8.9–10.3)
Chloride: 104 mmol/L (ref 98–111)
Creatinine: 0.92 mg/dL (ref 0.61–1.24)
GFR, Estimated: >60 mL/min (ref >60)
Glucose, Bld: 108 mg/dL — ABNORMAL HIGH (ref 70–99)
Potassium: 4.3 mmol/L (ref 3.5–5.1)
Sodium: 136 mmol/L (ref 135–145)
Total Bilirubin: 0.6 mg/dL (ref <1.2)
Total Protein: 6.5 g/dL (ref 6.5–8.1)

## 2023-04-13 MED ORDER — SODIUM CHLORIDE 0.9 % IV SOLN
200.0000 mg | Freq: Once | INTRAVENOUS | Status: AC
  Administered 2023-04-13: 200 mg via INTRAVENOUS
  Filled 2023-04-13: qty 200

## 2023-04-13 MED ORDER — SODIUM CHLORIDE 0.9% FLUSH
10.0000 mL | INTRAVENOUS | Status: DC | PRN
Start: 2023-04-13 — End: 2023-04-13
  Administered 2023-04-13: 10 mL

## 2023-04-13 MED ORDER — SODIUM CHLORIDE 0.9 % IV SOLN: Freq: Once | INTRAVENOUS | Status: AC

## 2023-04-13 NOTE — Patient Instructions (Signed)

## 2023-04-25 ENCOUNTER — Telehealth: Payer: Self-pay

## 2023-04-25 NOTE — Telephone Encounter (Signed)
Notified Patient of completion of Disability Form. Fax transmission confirmation received. Copy of form placed for pick-up as requested by Patient. No other needs or concerns voiced at this time. ?

## 2023-04-27 ENCOUNTER — Encounter: Payer: Self-pay | Admitting: Internal Medicine

## 2023-04-27 ENCOUNTER — Encounter: Payer: Self-pay | Admitting: Physician Assistant

## 2023-04-30 NOTE — Progress Notes (Signed)
 [facility] OFFICE PROGRESS NOTE  [name], NP [address] [place] [address]  DIAGNOSIS: Stage IV (T3, N0, M1C) non-small cell lung cancer, adenocarcinoma.  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 He was diagnosed in July 2021.   Molecular Biomarkers:  MSI-High DETECTED Pembrolizumab  Atezolizumab, Avelumab, Cemiplimab, Dostarlimab, Durvalumab, Ipilimumab, Nivolumab   STK11Splice Site SNV 1.9% Everolimus, Temsirolimus Yes   KRASG12D 1.7% Binimetinib Yes   [id number] 0.4%   Niraparib, Olaparib, Rucaparib, Talazoparib, Tazemetostat Yes    PRIOR THERAPY: 1) SRS to the solitary brain metastasis under the care of Dr. Dewey. Last treatment 11/14/19. 2) Weekly concurrent chemoradiation with carboplatin  for an AUC of 2, paclitaxel  45 mg/m2.  First dose expected on 11/25/2019. Status post 7 cycles, last dose was given 01/06/2020 with partial response.  3)  Immunotherapy with Keytruda  200 mg IV every 3 weeks.  First dose February 10, 2020 for a patient with MSI high.  Status post 35  cycles. 4) Avastin  15 mg/KG every 3 weeks.  First dose today for the vasogenic edema of the brain.S/P 7 cycles. 5) The patient had evidence for local disease recurrence in July 2024. 6) SRS to the occipital brain lesion under the care of Dr. Dewey on 12/22/22 7) 2) UHRT under the care of Dr. Dewey, last dose expected on 03/03/23 8) SRS to the new metastatic brain lesion on 03/07/23  CURRENT THERAPY: 1) Resuming his treatment with Keytruda  200 Mg IV every 3 weeks. First dose 11/16/2022. Status post 8 cycles   INTERVAL HISTORY: Joseph Atkins 63 y.o. male returns to the clinic today for a follow up visit accompanied by his wife. The patient recently had evidence of disease progression with increased activity of the central aspect of the right lower lobe concerning for local disease  recurrence. Therefore, he resumed immunotherapy with keytruda . He tolerates this well without any adverse side effects except in the past he has had pruritic nodular lesions from keytruda  for which he would use kenalog  cream and zyrtec. He has not had significant rashes since resuming his treatment.   At his last appointment he c/o sciatica right thigh. He saw his PCP who informed him he had significant arthritis in his hip. He used heating pads and biofreeze which helped. He still sometimes has pain but it is improved compared to prior.   He is followed closely by Dr. Buckley for his metastatic disease to the brain.    Otherwise, he is doing fair. I gave him hycodan at a prior appointment and his cough had improved. He still is no longer requiring his hycodan but he has it if needed. Denies any associated fevers, chills, night sweats, hemoptysis, or signs of infection. Denies changes in his shortness of breath except he had a lot of wheezing last night. He has albuterol  but did not take it. Denies any nausea, vomiting, or diarrhea.He had some constipation due to eating peanut butter balls per patient report. He is here today for evaluation and to review his scan results before undergoing cycle #9  MEDICAL HISTORY: Past Medical History:  Diagnosis Date   Cancer (HCC)    lung cancer   Diverticulosis    GERD (gastroesophageal reflux disease)    Hx of small bowel obstruction    Hyperlipidemia    Hypertension    Hypothyroidism    IBS (irritable bowel syndrome)    Substance abuse (  HCC)    Alcoholic, Drug addition   Thyroid  disease     ALLERGIES:  is allergic to penicillins.  MEDICATIONS:  Current Outpatient Medications  Medication Sig Dispense Refill   albuterol  (PROVENTIL  HFA;VENTOLIN  HFA) 108 (90 Base) MCG/ACT inhaler Inhale 2 puffs into the lungs every 6 (six) hours as needed for wheezing or shortness of breath. 1 Inhaler 0   amLODipine  (NORVASC ) 5 MG tablet TAKE 1 TABLET (5 MG TOTAL) BY  MOUTH DAILY. 90 tablet 1   ANORO ELLIPTA  62.5-25 MCG/INH AEPB 1 puff daily.     augmented betamethasone dipropionate (DIPROLENE-AF) 0.05 % cream Apply topically 2 (two) times daily.     azithromycin  (ZITHROMAX  Z-PAK) 250 MG tablet Take as directed 6 each 0   benzonatate  (TESSALON ) 100 MG capsule Take 1 capsule (100 mg total) by mouth 3 (three) times daily as needed. 30 capsule 2   [facility] ASPIRIN  LOW DOSE 81 MG tablet TAKE 1 TABLET (81 MG TOTAL) BY MOUTH DAILY. SWALLOW WHOLE. 90 tablet 3   cyclobenzaprine  (FLEXERIL ) 10 MG tablet Take 10 mg by mouth 2 (two) times daily as needed for muscle spasms.     dexamethasone  (DECADRON ) 1 MG tablet Take 1 tablet (1 mg total) by mouth daily with breakfast. 60 tablet 1   docusate sodium  (COLACE) 100 MG capsule Take 100 mg by mouth daily.     DULoxetine  (CYMBALTA ) 20 MG capsule Take 1 capsule (20 mg total) by mouth 2 (two) times daily. 60 capsule 5   fluticasone  furoate-vilanterol (BREO ELLIPTA ) 100-25 MCG/INH AEPB Inhale 1 puff into the lungs daily. 30 each 3   HYDROcodone  bit-homatropine (HYCODAN) 5-1.5 MG/5ML syrup Take 5 mLs by mouth every 6 (six) hours as needed for cough. 473 mL 0   hydrOXYzine  (ATARAX ) 25 MG tablet Take 25 mg by mouth at bedtime.     levothyroxine  (SYNTHROID ) 100 MCG tablet TAKE 1 TABLET BY MOUTH EVERY DAY 90 tablet 1   lidocaine -prilocaine  (EMLA ) cream Apply 1 application topically as needed. 30 g 0   omeprazole  (PRILOSEC) 40 MG capsule Take 1 capsule (40 mg total) by mouth daily. 90 capsule 4   ondansetron  (ZOFRAN ) 4 MG tablet Take 1 tablet (4 mg total) by mouth every 8 (eight) hours as needed. 40 tablet 2   oxyCODONE -acetaminophen  (PERCOCET/ROXICET) 5-325 MG tablet Take 1 tablet by mouth every 4 (four) hours as needed for severe pain. 30 tablet 0   pembrolizumab  (KEYTRUDA ) 100 MG/4ML SOLN See admin instructions.     polyethylene glycol powder (MIRALAX ) powder Take 17 g by mouth daily. 255 g 11   prochlorperazine  (COMPAZINE ) 10 MG tablet  Take 1 tablet (10 mg total) by mouth every 6 (six) hours as needed. 30 tablet 2   temazepam  (RESTORIL ) 30 MG capsule Take 1 capsule (30 mg total) by mouth at bedtime as needed for sleep. 30 capsule 0   triamcinolone  cream (KENALOG ) 0.1 % APPLY TOPICALLY 2 TIMES DAILY AS NEEDED. 454 g 0   TRULANCE 3 MG TABS Take 1 tablet by mouth daily.     No current facility-administered medications for this visit.    SURGICAL HISTORY:  Past Surgical History:  Procedure Laterality Date   arm surgery Right    BRONCHIAL BRUSHINGS  10/24/2019   Procedure: BRONCHIAL BRUSHINGS;  Surgeon: Shelah Lamar RAMAN, MD;  Location: [facility] ENDOSCOPY;  Service: Cardiopulmonary;;  right lower lobe    BRONCHIAL BRUSHINGS  11/05/2019   Procedure: BRONCHIAL BRUSHINGS;  Surgeon: Shelah Lamar RAMAN, MD;  Location: [facility] ENDOSCOPY;  Service: Pulmonary;;   BRONCHIAL NEEDLE ASPIRATION BIOPSY  10/24/2019   Procedure: BRONCHIAL NEEDLE ASPIRATION BIOPSIES;  Surgeon: Shelah Lamar RAMAN, MD;  Location: [facility] ENDOSCOPY;  Service: Cardiopulmonary;;   BRONCHIAL NEEDLE ASPIRATION BIOPSY  11/05/2019   Procedure: BRONCHIAL NEEDLE ASPIRATION BIOPSIES;  Surgeon: Shelah Lamar RAMAN, MD;  Location: [facility] ENDOSCOPY;  Service: Pulmonary;;   ENDOBRONCHIAL ULTRASOUND N/A 10/24/2019   Procedure: ENDOBRONCHIAL ULTRASOUND;  Surgeon: Shelah Lamar RAMAN, MD;  Location: [facility] ENDOSCOPY;  Service: Cardiopulmonary;  Laterality: N/A;   FINGER SURGERY Right    Middle   IR IMAGING GUIDED PORT INSERTION  11/19/2019   VIDEO BRONCHOSCOPY N/A 10/24/2019   Procedure: VIDEO BRONCHOSCOPY WITHOUT FLUORO;  Surgeon: Shelah Lamar RAMAN, MD;  Location: [facility] ENDOSCOPY;  Service: Cardiopulmonary;  Laterality: N/A;   VIDEO BRONCHOSCOPY WITH ENDOBRONCHIAL NAVIGATION N/A 11/05/2019   Procedure: VIDEO BRONCHOSCOPY WITH ENDOBRONCHIAL NAVIGATION;  Surgeon: Shelah Lamar RAMAN, MD;  Location: [facility] ENDOSCOPY;  Service: Pulmonary;  Laterality: N/A;    REVIEW OF SYSTEMS:   Constitutional: Positive for fatigue. Negative for chills  and fever.  HENT: Negative for mouth sores, nosebleeds, sore throat and trouble swallowing.   Eyes: Positive for vision changes. Negative for eye problems and icterus.  Respiratory: Stable shortness of breath with exertion and Improved cough.  Negative for hemoptysis, and wheezing.   Cardiovascular: Improved right lower extremity swelling and right arm swelling.  Negative for chest pain. Gastrointestinal: Mild constipation. Negative for diarrhea, nausea and vomiting.  Genitourinary: Negative for bladder incontinence, difficulty urinating, dysuria, frequency and hematuria.   Musculoskeletal: Positive for improving right thigh pain. Negative for back pain, gait problem, neck pain and neck stiffness.  Skin: Positive for occasional rashes and itching (none at this time).  Neurological: Positive for stable balance changes and gait disturbances.  Negative for extremity weakness, headaches, light-headedness and seizures.  Hematological: Negative for adenopathy. Does not bruise/bleed easily.  Psychiatric/Behavioral: Negative for confusion, depression and sleep disturbance. The patient is not nervous/anxious.    PHYSICAL EXAMINATION:  There were no vitals taken for this visit.  ECOG PERFORMANCE STATUS: 1  Physical Exam  Constitutional: Oriented to person, place, and time and well-developed, well-nourished, and in no distress. SABRA  HENT:  Head: Normocephalic and atraumatic.  Mouth/Throat: Oropharynx is clear and moist. No oropharyngeal exudate.  Eyes: Conjunctivae are normal. Right eye exhibits no discharge. Left eye exhibits no discharge. No scleral icterus.  Neck: Normal range of motion. Neck supple.  Cardiovascular: Normal rate, regular rhythm, normal heart sounds and intact distal pulses.   Pulmonary/Chest: Effort normal. Quiet breath sounds bilaterally. Mild wheezing in the right lung. No respiratory distress.  No rales.  Abdominal: Soft. Bowel sounds are normal. Exhibits no distension and no  mass. There is no tenderness.  Musculoskeletal: Normal range of motion. Exhibits no edema.  Lymphadenopathy:    No cervical adenopathy.  Neurological: Alert and oriented to person, place, and time. Exhibits normal muscle tone. Gait normal. Coordination normal.  Skin: Skin is warm and dry. No rash noted. Not diaphoretic. No erythema. No pallor.  Psychiatric: Mood, memory and judgment normal.  Vitals reviewed.  LABORATORY DATA: Lab Results  Component Value Date   WBC 6.6 04/13/2023   HGB 12.0 (L) 04/13/2023   HCT 36.1 (L) 04/13/2023   MCV 88.5 04/13/2023   PLT 191 04/13/2023      Chemistry      Component Value Date/Time   NA 136 04/13/2023 1023   NA 141 03/08/2017 1707   K 4.3 04/13/2023 1023  CL 104 04/13/2023 1023   CO2 26 04/13/2023 1023   BUN 6 (L) 04/13/2023 1023   BUN 12 03/08/2017 1707   CREATININE 0.92 04/13/2023 1023      Component Value Date/Time   CALCIUM  9.0 04/13/2023 1023   ALKPHOS 78 04/13/2023 1023   AST 19 04/13/2023 1023   ALT 19 04/13/2023 1023   BILITOT 0.6 04/13/2023 1023       RADIOGRAPHIC STUDIES:  CT CHEST ABDOMEN PELVIS W CONTRAST Result Date: 04/06/2023 CLINICAL DATA:  Adenocarcinoma right lung stage IV * Tracking Code: BO * EXAM: CT CHEST, ABDOMEN, AND PELVIS WITH CONTRAST TECHNIQUE: Multidetector CT imaging of the chest, abdomen and pelvis was performed following the standard protocol during bolus administration of intravenous contrast. RADIATION DOSE REDUCTION: This exam was performed according to the departmental dose-optimization program which includes automated exposure control, adjustment of the mA and/or kV according to patient size and/or use of iterative reconstruction technique. CONTRAST:  75mL OMNIPAQUE  IOHEXOL  300 MG/ML  SOLN COMPARISON:  01/06/2023.  Older exams as well. FINDINGS: CT CHEST FINDINGS Cardiovascular: Heart is nonenlarged. Moderate pericardial effusion is stable. Coronary artery calcifications are seen. There also  calcifications in the area of the aortic valve. The thoracic aorta has a normal course and caliber with scattered atherosclerotic mild partially calcified plaque. Right IJ chest port is accessed and has a tip extending to the SVC right atrial junction region. Mediastinum/Nodes: Small thyroid  gland. Normal caliber thoracic esophagus. No specific abnormal lymph node enlargement identified in the axillary region, hilum or mediastinum. Lungs/Pleura: Right lower lobe mass again identified abutting the hilum with some marginal calcification. Previously measuring 4.5 x 4.0 x 3.8 cm on the prior examination and today 4.2 x 3.8 cm in the axial plane and cephalocaudal 3.6 cm. The adjacent parenchymal ill-defined opacity that is confluence with distortion and irregularity is stable. Associated pleural thickening and tiny right effusion has increased. The small peripheral nodule in the right lower lobe seen previously as well is stable today on series 5, image 100 measuring 5 mm. Centrilobular emphysematous changes are seen. No pneumothorax. No left-sided consolidation. No new dominant lung mass. Musculoskeletal: Scattered mild degenerative changes along the spine. Gynecomastia CT ABDOMEN PELVIS FINDINGS Hepatobiliary: No focal liver abnormality is seen. No gallstones, gallbladder wall thickening, or biliary dilatation. Patent portal vein. Pancreas: Unremarkable. No pancreatic ductal dilatation or surrounding inflammatory changes. Spleen: Normal in size without focal abnormality. Adrenals/Urinary Tract: Adrenal glands are unremarkable. Kidneys are normal, without renal calculi, focal lesion, or hydronephrosis. Bladder is unremarkable. Stomach/Bowel: Moderate debris in the stomach. On this non oral contrast exam, the small and large bowel are nondilated. Diffuse large amount of colonic stool. Normal appendix. Vascular/Lymphatic: Aortic atherosclerosis. Occlusion of the left common iliac vessels is again noted. Reconstitution  distally of the left external and common femoral artery with significant calcified plaque. Please correlate with symptoms no enlarged abdominal or pelvic lymph nodes. Retroaortic left renal vein. Reproductive: Prostate is unremarkable. Other: No abdominal wall hernia or abnormality. No abdominopelvic ascites. Musculoskeletal: Mild degenerative changes along the spine and pelvis. IMPRESSION: Right-sided lower lobe perihilar lung mass again seen and minimally decreased today compared to previous. In the adjacent parenchymal opacity and pneumonitis is similar. Slight increase in tiny right effusion. Stable separate tiny right lower lobe nodule. Attention on follow-up. No developing new lymph node enlargement. Stable moderate pericardial effusion. No bowel obstruction. No free air or free fluid. Diffuse colonic stool. 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 Please correlate with symptoms and history Electronically Signed   By: Ranell Bring M.D.   On: 04/06/2023 17:26     ASSESSMENT/PLAN:  This is a very pleasant 63 year old Caucasian male diagnosed with stage IV non-small cell lung cancer, adenocarcinoma.  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 He was diagnosed in July 2021. His molecular studies by [employer] show he has MSI high which is a good marker for response to immunotherapy which will be important for future treatment.     The patient will complete SRS to the solidary brain metastasis under the care of Dr. Dewey later today on 11/14/19.    He then underwent a course of weekly concurrent chemoradiation with carboplatin  for an AUC of 2 and paclitaxel  45 mg per metered squared.  He tolerated this treatment well except for fatigue and mild odynophagia.   The patient showed evidence of disease progression with an increase in volume of the right  infrahilar mass.  Since the patient has MSI high, the patient then underwent treatment with single agent immunotherapy with Keytruda  200 mg IV every 3 weeks.   The patient is status post 35 cycles and completed his 2 years of treatment on 03/23/22.  He also received 7 cycles of Avastin  for vasogenic edema in the brain.  Avastin  has been on hold unless needed again in the future.     He has SRS to an occipital brain lesion on 12/22/22 by Dr. Dewey   He had evidence of progression on his surveillance imaging from July 2024. Therefore, he resumed immunotherapy with Keytruda . He is status post 8 cycles.   His last scan showed some disease progression and he underwent UHRT in the last day was on 03/03/2023.  He is also was found to have new metastatic disease to the brain and had SRS on 03/07/23.   Labs were reviewed. Recommend that he proceed  with cycle #7 today as scheduled.    We will see him back for a follow up in 3 weeks for evaluation and repeat blood work before undergoing cycle #10  The patient was advised to call immediately if she has any concerning symptoms in the interval. The patient voices understanding of current disease status and treatment options and is in agreement with the current care plan. All questions were answered. The patient knows to call the clinic with any problems, questions or concerns. We can certainly see the patient much sooner if necessary      No orders of the defined types were placed in this encounter.    The total time spent in the appointment was 20-29 minutes  Alfard Cochrane L Amaury Kuzel, PA-C 04/30/23

## 2023-05-04 ENCOUNTER — Inpatient Hospital Stay: Payer: Managed Care, Other (non HMO) | Attending: Internal Medicine | Admitting: Physician Assistant

## 2023-05-04 ENCOUNTER — Inpatient Hospital Stay: Payer: Managed Care, Other (non HMO)

## 2023-05-04 VITALS — HR 96

## 2023-05-04 VITALS — BP 132/74 | HR 104 | Temp 97.9°F | Resp 17 | Wt 188.0 lb

## 2023-05-04 DIAGNOSIS — I10 Essential (primary) hypertension: Secondary | ICD-10-CM | POA: Diagnosis not present

## 2023-05-04 DIAGNOSIS — I700 Atherosclerosis of aorta: Secondary | ICD-10-CM | POA: Insufficient documentation

## 2023-05-04 DIAGNOSIS — K219 Gastro-esophageal reflux disease without esophagitis: Secondary | ICD-10-CM | POA: Diagnosis not present

## 2023-05-04 DIAGNOSIS — C7931 Secondary malignant neoplasm of brain: Secondary | ICD-10-CM | POA: Diagnosis present

## 2023-05-04 DIAGNOSIS — Z7982 Long term (current) use of aspirin: Secondary | ICD-10-CM | POA: Insufficient documentation

## 2023-05-04 DIAGNOSIS — Z5112 Encounter for antineoplastic immunotherapy: Secondary | ICD-10-CM | POA: Diagnosis not present

## 2023-05-04 DIAGNOSIS — E785 Hyperlipidemia, unspecified: Secondary | ICD-10-CM | POA: Insufficient documentation

## 2023-05-04 DIAGNOSIS — C3491 Malignant neoplasm of unspecified part of right bronchus or lung: Secondary | ICD-10-CM | POA: Diagnosis not present

## 2023-05-04 DIAGNOSIS — E039 Hypothyroidism, unspecified: Secondary | ICD-10-CM | POA: Insufficient documentation

## 2023-05-04 DIAGNOSIS — K581 Irritable bowel syndrome with constipation: Secondary | ICD-10-CM | POA: Insufficient documentation

## 2023-05-04 DIAGNOSIS — Z923 Personal history of irradiation: Secondary | ICD-10-CM | POA: Insufficient documentation

## 2023-05-04 DIAGNOSIS — C3431 Malignant neoplasm of lower lobe, right bronchus or lung: Secondary | ICD-10-CM | POA: Insufficient documentation

## 2023-05-04 DIAGNOSIS — K589 Irritable bowel syndrome without diarrhea: Secondary | ICD-10-CM | POA: Insufficient documentation

## 2023-05-04 DIAGNOSIS — Z7952 Long term (current) use of systemic steroids: Secondary | ICD-10-CM | POA: Diagnosis not present

## 2023-05-04 DIAGNOSIS — I82522 Chronic embolism and thrombosis of left iliac vein: Secondary | ICD-10-CM | POA: Diagnosis not present

## 2023-05-04 DIAGNOSIS — F1010 Alcohol abuse, uncomplicated: Secondary | ICD-10-CM | POA: Insufficient documentation

## 2023-05-04 DIAGNOSIS — I3139 Other pericardial effusion (noninflammatory): Secondary | ICD-10-CM | POA: Insufficient documentation

## 2023-05-04 DIAGNOSIS — Z7951 Long term (current) use of inhaled steroids: Secondary | ICD-10-CM | POA: Insufficient documentation

## 2023-05-04 DIAGNOSIS — Z79890 Hormone replacement therapy (postmenopausal): Secondary | ICD-10-CM | POA: Insufficient documentation

## 2023-05-04 DIAGNOSIS — Z95828 Presence of other vascular implants and grafts: Secondary | ICD-10-CM

## 2023-05-04 DIAGNOSIS — Z79899 Other long term (current) drug therapy: Secondary | ICD-10-CM | POA: Insufficient documentation

## 2023-05-04 LAB — CBC WITH DIFFERENTIAL (CANCER CENTER ONLY)
Abs Immature Granulocytes: 0.04 10*3/uL (ref 0.00–0.07)
Basophils Absolute: 0 10*3/uL (ref 0.0–0.1)
Basophils Relative: 0 %
Eosinophils Absolute: 0 10*3/uL (ref 0.0–0.5)
Eosinophils Relative: 0 %
HCT: 37.7 % — ABNORMAL LOW (ref 39.0–52.0)
Hemoglobin: 12.3 g/dL — ABNORMAL LOW (ref 13.0–17.0)
Immature Granulocytes: 0 %
Lymphocytes Relative: 6 %
Lymphs Abs: 0.6 10*3/uL — ABNORMAL LOW (ref 0.7–4.0)
MCH: 29.5 pg (ref 26.0–34.0)
MCHC: 32.6 g/dL (ref 30.0–36.0)
MCV: 90.4 fL (ref 80.0–100.0)
Monocytes Absolute: 0.7 10*3/uL (ref 0.1–1.0)
Monocytes Relative: 8 %
Neutro Abs: 7.6 10*3/uL (ref 1.7–7.7)
Neutrophils Relative %: 86 %
Platelet Count: 193 10*3/uL (ref 150–400)
RBC: 4.17 MIL/uL — ABNORMAL LOW (ref 4.22–5.81)
RDW: 16.1 % — ABNORMAL HIGH (ref 11.5–15.5)
WBC Count: 9 10*3/uL (ref 4.0–10.5)
nRBC: 0 % (ref 0.0–0.2)

## 2023-05-04 LAB — CMP (CANCER CENTER ONLY)
ALT: 16 U/L (ref 0–44)
AST: 16 U/L (ref 15–41)
Albumin: 3.9 g/dL (ref 3.5–5.0)
Alkaline Phosphatase: 69 U/L (ref 38–126)
Anion gap: 7 (ref 5–15)
BUN: 9 mg/dL (ref 8–23)
CO2: 26 mmol/L (ref 22–32)
Calcium: 8.7 mg/dL — ABNORMAL LOW (ref 8.9–10.3)
Chloride: 102 mmol/L (ref 98–111)
Creatinine: 1 mg/dL (ref 0.61–1.24)
GFR, Estimated: >60 mL/min (ref >60)
Glucose, Bld: 156 mg/dL — ABNORMAL HIGH (ref 70–99)
Potassium: 3.8 mmol/L (ref 3.5–5.1)
Sodium: 135 mmol/L (ref 135–145)
Total Bilirubin: 0.6 mg/dL (ref 0.0–1.2)
Total Protein: 6.6 g/dL (ref 6.5–8.1)

## 2023-05-04 LAB — TSH: TSH: 6.765 u[IU]/mL — ABNORMAL HIGH (ref 0.350–4.500)

## 2023-05-04 MED ORDER — SODIUM CHLORIDE 0.9% FLUSH
10.0000 mL | INTRAVENOUS | Status: DC | PRN
  Administered 2023-05-04: 10 mL

## 2023-05-04 MED ORDER — HEPARIN SOD (PORK) LOCK FLUSH 100 UNIT/ML IV SOLN
500.0000 [IU] | Freq: Once | INTRAVENOUS | Status: AC | PRN
Start: 2023-05-04 — End: 2023-05-04
  Administered 2023-05-04: 500 [IU]

## 2023-05-04 MED ORDER — SODIUM CHLORIDE 0.9% FLUSH
10.0000 mL | INTRAVENOUS | Status: DC | PRN
Start: 2023-05-04 — End: 2023-05-04
  Administered 2023-05-04: 10 mL

## 2023-05-04 MED ORDER — SODIUM CHLORIDE 0.9 % IV SOLN
200.0000 mg | Freq: Once | INTRAVENOUS | Status: AC
  Administered 2023-05-04: 200 mg via INTRAVENOUS
  Filled 2023-05-04: qty 200

## 2023-05-04 MED ORDER — SODIUM CHLORIDE 0.9 % IV SOLN: Freq: Once | INTRAVENOUS | Status: AC

## 2023-05-04 NOTE — Patient Instructions (Signed)
 [facility] - A DEPT OF [facility]  Discharge Instructions: Thank you for choosing [facility] to provide your oncology and hematology care.   If you have a lab appointment with the Cancer Center, please go directly to the Cancer Center and check in at the registration area.   Wear comfortable clothing and clothing appropriate for easy access to any Portacath or PICC line.   We strive to give you quality time with your provider. You may need to reschedule your appointment if you arrive late (15 or more minutes).  Arriving late affects you and other patients whose appointments are after yours.  Also, if you miss three or more appointments without notifying the office, you may be dismissed from the clinic at the provider's discretion.      For prescription refill requests, have your pharmacy contact our office and allow 72 hours for refills to be completed.    Today you received the following chemotherapy and/or immunotherapy agents: pembrolizumab      To help prevent nausea and vomiting after your treatment, we encourage you to take your nausea medication as directed.  BELOW ARE SYMPTOMS THAT SHOULD BE REPORTED IMMEDIATELY: *FEVER GREATER THAN 100.4 F (38 C) OR HIGHER *CHILLS OR SWEATING *NAUSEA AND VOMITING THAT IS NOT CONTROLLED WITH YOUR NAUSEA MEDICATION *UNUSUAL SHORTNESS OF BREATH *UNUSUAL BRUISING OR BLEEDING *URINARY PROBLEMS (pain or burning when urinating, or frequent urination) *BOWEL PROBLEMS (unusual diarrhea, constipation, pain near the anus) TENDERNESS IN MOUTH AND THROAT WITH OR WITHOUT PRESENCE OF ULCERS (sore throat, sores in mouth, or a toothache) UNUSUAL RASH, SWELLING OR PAIN  UNUSUAL VAGINAL DISCHARGE OR ITCHING   Items with * indicate a potential emergency and should be followed up as soon as possible or go to the Emergency Department if any problems should occur.  Please show the CHEMOTHERAPY ALERT CARD or  IMMUNOTHERAPY ALERT CARD at check-in to the Emergency Department and triage nurse.  Should you have questions after your visit or need to cancel or reschedule your appointment, please contact [facility] - A DEPT OF [facility]  Dept: [phone]  and follow the prompts.  Office hours are 8:00 a.m. to 4:30 p.m. Monday - Friday. Please note that voicemails left after 4:00 p.m. may not be returned until the following business day.  We are closed weekends and major holidays. You have access to a nurse at all times for urgent questions. Please call the main number to the clinic Dept: [phone] and follow the prompts.   For any non-urgent questions, you may also contact your provider using MyChart. We now offer e-Visits for anyone 78 and older to request care online for non-urgent symptoms. For details visit [email or web address].   Also download the MyChart app! Go to the app store, search "MyChart", open the app, select [place], and log in with your MyChart username and password.

## 2023-05-06 ENCOUNTER — Other Ambulatory Visit: Payer: Self-pay

## 2023-05-06 LAB — T4: T4, Total: 10.1 ug/dL (ref 4.5–12.0)

## 2023-05-07 ENCOUNTER — Other Ambulatory Visit: Payer: Self-pay

## 2023-05-17 NOTE — Progress Notes (Signed)
[facility] OFFICE PROGRESS NOTE  Joseph Chesterfield, NP [address] [name] [address] [place] [address]  DIAGNOSIS:  Stage IV (T3, N0, M1C) non-small cell lung cancer, adenocarcinoma.  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 He was diagnosed in July 2021.   Molecular Biomarkers:  MSI-High DETECTED Pembrolizumab Atezolizumab, Avelumab, Cemiplimab, Dostarlimab, Durvalumab, Ipilimumab, Nivolumab   STK11Splice Site SNV 1.9% Everolimus, Temsirolimus Yes   KRASG12D 1.7% Binimetinib Yes   [id number] 0.4%   Niraparib, Olaparib, Rucaparib, Talazoparib, Tazemetostat Yes  PRIOR THERAPY: 1) SRS to the solitary brain metastasis under the care of Dr. [name]. Last treatment 11/14/19. 2) Weekly concurrent chemoradiation with carboplatin for an AUC of 2, paclitaxel 45 mg/m2.  First dose expected on 11/25/2019. Status post 7 cycles, last dose was given 01/06/2020 with partial response.  3)  Immunotherapy with Keytruda 200 mg IV every 3 weeks.  First dose February 10, 2020 for a patient with MSI high.  Status post 35  cycles. 4) Avastin 15 mg/KG every 3 weeks.  First dose today for the vasogenic edema of the brain.S/P 7 cycles. 5) The patient had evidence for local disease recurrence in July 2024. 6) SRS to the occipital brain lesion under the care of Dr. [name] on 12/22/22 7) 2) UHRT under the care of Dr. [name], last dose expected on 03/03/23 8) SRS to the new metastatic brain lesion on 03/07/23  CURRENT THERAPY: 1) Resuming his treatment with Keytruda 200 Mg IV every 3 weeks. First dose 11/16/2022. Status post 9 cycles   INTERVAL HISTORY: Joseph Atkins 63 y.o. male returns to the clinic today for a follow up visit accompanied by his wife. The patient recently had evidence of disease progression with increased activity of the central aspect of the right lower lobe concerning for local disease  recurrence. Therefore, he resumed immunotherapy with [name]. He tolerates this well without any adverse side effects except in the past he has had pruritic nodular lesions from [facility] for which he would use kenalog cream and zyrtec. He has not had significant rashes since resuming his treatment.   Otherwise, he is doing fair. I gave him hycodan at a prior appointment and his cough had improved. Overall, his cough is still not like it was. He gets cough every once in awhile. Denies any associated fevers, night sweats, hemoptysis, or signs of infection. Denies changes in his shortness of breath which he may get with exertion. He is trying to make sure he uses his rescue inhaler if needed.  Denies any nausea, vomiting, or diarrhea.  Patient struggles with chronic constipation.  He has IBS constipation subtype.  He is on medication for this prescribed by his PCP called [facility].  He is taking laxatives every day.  He had to use a suppository recently. He drinks a lot of water and tries to eat apples daily. He used to take stool softeners but not lately and he is going to consider going back to stool softeners in addition to laxatives if needed. He is scheduled for his routine brain MRI next week on 06/01/2023.  He is here today for evaluation and repeat blood work undergoing cycle #10   MEDICAL HISTORY: Past Medical History:  Diagnosis Date   Cancer (HCC)    lung cancer   Diverticulosis    GERD (gastroesophageal reflux disease)    Hx of small bowel obstruction    Hyperlipidemia  Hypertension    Hypothyroidism    IBS (irritable bowel syndrome)    Substance abuse (HCC)    Alcoholic, Drug addition   Thyroid disease     ALLERGIES:  is allergic to penicillins.  MEDICATIONS:  Current Outpatient Medications  Medication Sig Dispense Refill   albuterol (PROVENTIL HFA;VENTOLIN HFA) 108 (90 Base) MCG/ACT inhaler Inhale 2 puffs into the lungs every 6 (six) hours as needed for wheezing or shortness of  breath. 1 Inhaler 0   amLODipine (NORVASC) 5 MG tablet TAKE 1 TABLET (5 MG TOTAL) BY MOUTH DAILY. 90 tablet 1   ANORO ELLIPTA 62.5-25 MCG/INH AEPB 1 puff daily.     augmented betamethasone dipropionate (DIPROLENE-AF) 0.05 % cream Apply topically 2 (two) times daily.     azithromycin (ZITHROMAX Z-PAK) 250 MG tablet Take as directed 6 each 0   benzonatate (TESSALON) 100 MG capsule Take 1 capsule (100 mg total) by mouth 3 (three) times daily as needed. 30 capsule 2   [employer] ASPIRIN LOW DOSE 81 MG tablet TAKE 1 TABLET (81 MG TOTAL) BY MOUTH DAILY. SWALLOW WHOLE. 90 tablet 3   cyclobenzaprine (FLEXERIL) 10 MG tablet Take 10 mg by mouth 2 (two) times daily as needed for muscle spasms.     dexamethasone (DECADRON) 1 MG tablet Take 1 tablet (1 mg total) by mouth daily with breakfast. 60 tablet 1   docusate sodium (COLACE) 100 MG capsule Take 100 mg by mouth daily.     DULoxetine (CYMBALTA) 20 MG capsule Take 1 capsule (20 mg total) by mouth 2 (two) times daily. 60 capsule 5   fluticasone furoate-vilanterol (BREO ELLIPTA) 100-25 MCG/INH AEPB Inhale 1 puff into the lungs daily. 30 each 3   HYDROcodone bit-homatropine (HYCODAN) 5-1.5 MG/5ML syrup Take 5 mLs by mouth every 6 (six) hours as needed for cough. 473 mL 0   hydrOXYzine (ATARAX) 25 MG tablet Take 25 mg by mouth at bedtime.     levothyroxine (SYNTHROID) 100 MCG tablet TAKE 1 TABLET BY MOUTH EVERY DAY 90 tablet 1   lidocaine-prilocaine (EMLA) cream Apply 1 application topically as needed. 30 g 0   omeprazole (PRILOSEC) 40 MG capsule Take 1 capsule (40 mg total) by mouth daily. 90 capsule 4   ondansetron (ZOFRAN) 4 MG tablet Take 1 tablet (4 mg total) by mouth every 8 (eight) hours as needed. 40 tablet 2   oxyCODONE-acetaminophen (PERCOCET/ROXICET) 5-325 MG tablet Take 1 tablet by mouth every 4 (four) hours as needed for severe pain. 30 tablet 0   pembrolizumab (KEYTRUDA) 100 MG/4ML SOLN See admin instructions.     polyethylene glycol powder (MIRALAX)  powder Take 17 g by mouth daily. 255 g 11   prochlorperazine (COMPAZINE) 10 MG tablet Take 1 tablet (10 mg total) by mouth every 6 (six) hours as needed. 30 tablet 2   temazepam (RESTORIL) 30 MG capsule Take 1 capsule (30 mg total) by mouth at bedtime as needed for sleep. 30 capsule 0   triamcinolone cream (KENALOG) 0.1 % APPLY TOPICALLY 2 TIMES DAILY AS NEEDED. 454 g 0   TRULANCE 3 MG TABS Take 1 tablet by mouth daily.     No current facility-administered medications for this visit.    SURGICAL HISTORY:  Past Surgical History:  Procedure Laterality Date   arm surgery Right    BRONCHIAL BRUSHINGS  10/24/2019   Procedure: BRONCHIAL BRUSHINGS;  Surgeon: Leslye Peer, MD;  Location: [facility] ENDOSCOPY;  Service: Cardiopulmonary;;  right lower lobe    BRONCHIAL BRUSHINGS  11/05/2019   Procedure: BRONCHIAL BRUSHINGS;  Surgeon: Leslye Peer, MD;  Location: [facility] ENDOSCOPY;  Service: Pulmonary;;   BRONCHIAL NEEDLE ASPIRATION BIOPSY  10/24/2019   Procedure: BRONCHIAL NEEDLE ASPIRATION BIOPSIES;  Surgeon: Leslye Peer, MD;  Location: [facility] ENDOSCOPY;  Service: Cardiopulmonary;;   BRONCHIAL NEEDLE ASPIRATION BIOPSY  11/05/2019   Procedure: BRONCHIAL NEEDLE ASPIRATION BIOPSIES;  Surgeon: Leslye Peer, MD;  Location: [facility] ENDOSCOPY;  Service: Pulmonary;;   ENDOBRONCHIAL ULTRASOUND N/A 10/24/2019   Procedure: ENDOBRONCHIAL ULTRASOUND;  Surgeon: Leslye Peer, MD;  Location: [facility] ENDOSCOPY;  Service: Cardiopulmonary;  Laterality: N/A;   FINGER SURGERY Right    Middle   IR IMAGING GUIDED PORT INSERTION  11/19/2019   VIDEO BRONCHOSCOPY N/A 10/24/2019   Procedure: VIDEO BRONCHOSCOPY WITHOUT FLUORO;  Surgeon: Leslye Peer, MD;  Location: [facility] ENDOSCOPY;  Service: Cardiopulmonary;  Laterality: N/A;   VIDEO BRONCHOSCOPY WITH ENDOBRONCHIAL NAVIGATION N/A 11/05/2019   Procedure: VIDEO BRONCHOSCOPY WITH ENDOBRONCHIAL NAVIGATION;  Surgeon: Leslye Peer, MD;  Location: [facility] ENDOSCOPY;  Service: Pulmonary;  Laterality: N/A;     REVIEW OF SYSTEMS:   Constitutional: Positive for stable fatigue. Negative for chills and fever.  HENT: Negative for mouth sores, nosebleeds, sore throat and trouble swallowing.   Eyes: Positive for vision changes. Negative for eye problems and icterus.  Respiratory: Stable shortness of breath with exertion and Improved cough but does have periodic intermittent cough.  Negative for hemoptysis, and wheezing.   Cardiovascular: Negative for chest pain. Gastrointestinal: Positive for chronic/frequent constipation. Negative for diarrhea, nausea and vomiting.  Genitourinary: Negative for bladder incontinence, difficulty urinating, dysuria, frequency and hematuria.   Musculoskeletal: Negative for back pain, gait problem, neck pain and neck stiffness.  Skin: Positive for occasional rashes and itching (none at this time).  Neurological: Positive for stable balance changes and gait disturbances.  Negative for extremity weakness, headaches, light-headedness and seizures.  Hematological: Negative for adenopathy. Does not bruise/bleed easily.  Psychiatric/Behavioral: Negative for confusion, depression and sleep disturbance. The patient is not nervous/anxious.      PHYSICAL EXAMINATION:  There were no vitals taken for this visit.  ECOG PERFORMANCE STATUS: 1  Physical Exam  Constitutional: Oriented to person, place, and time and well-developed, well-nourished, and in no distress. Marland Kitchen  HENT:  Head: Normocephalic and atraumatic.  Mouth/Throat: Oropharynx is clear and moist. No oropharyngeal exudate.  Eyes: Conjunctivae are normal. Right eye exhibits no discharge. Left eye exhibits no discharge. No scleral icterus.  Neck: Normal range of motion. Neck supple.  Cardiovascular: Normal rate, regular rhythm, normal heart sounds and intact distal pulses.   Pulmonary/Chest: Effort normal. Quiet breath sounds bilaterally. No respiratory distress.  No rales.  Abdominal: Soft. Bowel sounds are normal. Exhibits  no distension and no mass. There is no tenderness.  Musculoskeletal: Normal range of motion. Exhibits no edema.  Lymphadenopathy:    No cervical adenopathy.  Neurological: Alert and oriented to person, place, and time. Exhibits normal muscle tone. Gait normal. Coordination normal.  Skin: Skin is warm and dry. No rash noted. Not diaphoretic. No erythema. No pallor.  Psychiatric: Mood, memory and judgment normal.  Vitals reviewed.  LABORATORY DATA: Lab Results  Component Value Date   WBC 9.0 05/04/2023   HGB 12.3 (L) 05/04/2023   HCT 37.7 (L) 05/04/2023   MCV 90.4 05/04/2023   PLT 193 05/04/2023      Chemistry      Component Value Date/Time   NA 135 05/04/2023 0807   NA 141 03/08/2017 1707  K 3.8 05/04/2023 0807   CL 102 05/04/2023 0807   CO2 26 05/04/2023 0807   BUN 9 05/04/2023 0807   BUN 12 03/08/2017 1707   CREATININE 1.00 05/04/2023 0807      Component Value Date/Time   CALCIUM 8.7 (L) 05/04/2023 0807   ALKPHOS 69 05/04/2023 0807   AST 16 05/04/2023 0807   ALT 16 05/04/2023 0807   BILITOT 0.6 05/04/2023 0807       RADIOGRAPHIC STUDIES:  No results found.   ASSESSMENT/PLAN:  This is a very pleasant 62 year old Caucasian male diagnosed with stage IV non-small cell lung cancer, adenocarcinoma.  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 He was diagnosed in July 2021. His molecular studies by [employer] show he has MSI high which is a good marker for response to immunotherapy which will be important for future treatment.     The patient will complete SRS to the solidary brain metastasis under the care of Dr. [name] later today on 11/14/19.    He then underwent a course of weekly concurrent chemoradiation with carboplatin for an AUC of 2 and paclitaxel 45 mg per metered squared.  He tolerated this treatment well except for fatigue and mild odynophagia.    The patient showed evidence  of disease progression with an increase in volume of the right infrahilar mass.  Since the patient has MSI high, the patient then underwent treatment with single agent immunotherapy with Keytruda 200 mg IV every 3 weeks.   The patient is status post 35 cycles and completed his 2 years of treatment on 03/23/22.  He also received 7 cycles of Avastin for vasogenic edema in the brain.  Avastin has been on hold unless needed again in the future.     He has SRS to an occipital brain lesion on 12/22/22 by Dr. [name]   He had evidence of progression on his surveillance imaging from July 2024. Therefore, he resumed immunotherapy with Keytruda. He is status post 9 cycles.   He underwent UHRT in the last day was on 03/03/2023 for some disease progression that was seen at that time. He is also was found to have new metastatic disease to the brain and had SRS on 03/07/23.   Labs were reviewed. Recommend that he proceed with cycle #10 today as scheduled.   We will see him back for a follow up in 3 weeks for evaluation and repeat blood work before undergoing cycle #11.   I will arrange for restaging CT scan of the chest, abdomen, pelvis prior to his neck cycle of treatment.  We discussed adding on a stool softener in addition to laxatives for his constipation.  He may want to talk to his PCP about alternative medications for IBS constipation subtype.  He did not have any tenderness to palpation on exam today but had some abdominal discomfort yesterday which may have been associated with constipation.   The patient was advised to call immediately if he has any concerning symptoms in the interval. The patient voices understanding of current disease status and treatment options and is in agreement with the current care plan. All questions were answered. The patient knows to call the clinic with any problems, questions or concerns. We can certainly see the patient much sooner if necessary          No orders of  the defined types were placed in this encounter.  The total time spent in the appointment was 20-29 minutes  Tejah Brekke L Vandell Kun, PA-C 05/17/23

## 2023-05-18 ENCOUNTER — Encounter: Payer: Self-pay | Admitting: Physician Assistant

## 2023-05-18 ENCOUNTER — Encounter: Payer: Self-pay | Admitting: Internal Medicine

## 2023-05-25 ENCOUNTER — Inpatient Hospital Stay: Payer: Managed Care, Other (non HMO)

## 2023-05-25 ENCOUNTER — Inpatient Hospital Stay: Payer: Managed Care, Other (non HMO) | Admitting: Physician Assistant

## 2023-05-25 VITALS — BP 111/72 | HR 90 | Temp 98.2°F | Resp 16

## 2023-05-25 VITALS — BP 131/79 | HR 105 | Temp 97.7°F | Resp 17 | Wt 189.1 lb

## 2023-05-25 DIAGNOSIS — Z95828 Presence of other vascular implants and grafts: Secondary | ICD-10-CM

## 2023-05-25 DIAGNOSIS — Z5112 Encounter for antineoplastic immunotherapy: Secondary | ICD-10-CM | POA: Diagnosis not present

## 2023-05-25 DIAGNOSIS — C3491 Malignant neoplasm of unspecified part of right bronchus or lung: Secondary | ICD-10-CM

## 2023-05-25 DIAGNOSIS — C7931 Secondary malignant neoplasm of brain: Secondary | ICD-10-CM | POA: Diagnosis not present

## 2023-05-25 LAB — CMP (CANCER CENTER ONLY)
ALT: 14 U/L (ref 0–44)
AST: 18 U/L (ref 15–41)
Albumin: 3.8 g/dL (ref 3.5–5.0)
Alkaline Phosphatase: 75 U/L (ref 38–126)
Anion gap: 7 (ref 5–15)
BUN: 8 mg/dL (ref 8–23)
CO2: 26 mmol/L (ref 22–32)
Calcium: 8.4 mg/dL — ABNORMAL LOW (ref 8.9–10.3)
Chloride: 100 mmol/L (ref 98–111)
Creatinine: 1.01 mg/dL (ref 0.61–1.24)
GFR, Estimated: >60 mL/min (ref >60)
Glucose, Bld: 180 mg/dL — ABNORMAL HIGH (ref 70–99)
Potassium: 4.1 mmol/L (ref 3.5–5.1)
Sodium: 133 mmol/L — ABNORMAL LOW (ref 135–145)
Total Bilirubin: 0.6 mg/dL (ref 0.0–1.2)
Total Protein: 6.5 g/dL (ref 6.5–8.1)

## 2023-05-25 LAB — CBC WITH DIFFERENTIAL (CANCER CENTER ONLY)
Abs Immature Granulocytes: 0.09 10*3/uL — ABNORMAL HIGH (ref 0.00–0.07)
Basophils Absolute: 0.1 10*3/uL (ref 0.0–0.1)
Basophils Relative: 1 %
Eosinophils Absolute: 0 10*3/uL (ref 0.0–0.5)
Eosinophils Relative: 0 %
HCT: 37.2 % — ABNORMAL LOW (ref 39.0–52.0)
Hemoglobin: 11.9 g/dL — ABNORMAL LOW (ref 13.0–17.0)
Immature Granulocytes: 1 %
Lymphocytes Relative: 4 %
Lymphs Abs: 0.5 10*3/uL — ABNORMAL LOW (ref 0.7–4.0)
MCH: 29.2 pg (ref 26.0–34.0)
MCHC: 32 g/dL (ref 30.0–36.0)
MCV: 91.4 fL (ref 80.0–100.0)
Monocytes Absolute: 0.8 10*3/uL (ref 0.1–1.0)
Monocytes Relative: 7 %
Neutro Abs: 9.7 10*3/uL — ABNORMAL HIGH (ref 1.7–7.7)
Neutrophils Relative %: 87 %
Platelet Count: 248 10*3/uL (ref 150–400)
RBC: 4.07 MIL/uL — ABNORMAL LOW (ref 4.22–5.81)
RDW: 15.4 % (ref 11.5–15.5)
WBC Count: 11.1 10*3/uL — ABNORMAL HIGH (ref 4.0–10.5)
nRBC: 0 % (ref 0.0–0.2)

## 2023-05-25 MED ORDER — SODIUM CHLORIDE 0.9 % IV SOLN: Freq: Once | INTRAVENOUS | Status: AC

## 2023-05-25 MED ORDER — SODIUM CHLORIDE 0.9% FLUSH
10.0000 mL | INTRAVENOUS | Status: DC | PRN
  Administered 2023-05-25: 10 mL

## 2023-05-25 MED ORDER — HEPARIN SOD (PORK) LOCK FLUSH 100 UNIT/ML IV SOLN
500.0000 [IU] | Freq: Once | INTRAVENOUS | Status: AC | PRN
  Administered 2023-05-25: 500 [IU]

## 2023-05-25 MED ORDER — SODIUM CHLORIDE 0.9% FLUSH
10.0000 mL | INTRAVENOUS | Status: DC | PRN
Start: 2023-05-25 — End: 2023-05-25
  Administered 2023-05-25: 10 mL

## 2023-05-25 MED ORDER — SODIUM CHLORIDE 0.9 % IV SOLN
200.0000 mg | Freq: Once | INTRAVENOUS | Status: AC
  Administered 2023-05-25: 200 mg via INTRAVENOUS
  Filled 2023-05-25: qty 200

## 2023-05-26 ENCOUNTER — Other Ambulatory Visit: Payer: Self-pay | Admitting: Physician Assistant

## 2023-05-26 ENCOUNTER — Other Ambulatory Visit: Payer: Self-pay | Admitting: Internal Medicine

## 2023-05-27 ENCOUNTER — Other Ambulatory Visit: Payer: Self-pay

## 2023-05-29 ENCOUNTER — Encounter: Payer: Self-pay | Admitting: Physician Assistant

## 2023-06-01 ENCOUNTER — Ambulatory Visit
Admission: RE | Admit: 2023-06-01 | Discharge: 2023-06-01 | Disposition: A | Discharge status: Home / Self Care | Payer: Managed Care, Other (non HMO) | Source: Ambulatory Visit | Attending: Internal Medicine | Admitting: Internal Medicine

## 2023-06-01 DIAGNOSIS — C7931 Secondary malignant neoplasm of brain: Secondary | ICD-10-CM

## 2023-06-01 MED ORDER — HEPARIN SOD (PORK) LOCK FLUSH 100 UNIT/ML IV SOLN
500.0000 [IU] | Freq: Once | INTRAVENOUS | Status: AC
  Administered 2023-06-01: 500 [IU] via INTRAVENOUS

## 2023-06-01 MED ORDER — SODIUM CHLORIDE 0.9% FLUSH
10.0000 mL | INTRAVENOUS | Status: DC | PRN
  Administered 2023-06-01: 10 mL via INTRAVENOUS

## 2023-06-01 MED ORDER — GADOPICLENOL 0.5 MMOL/ML IV SOLN
8.0000 mL | Freq: Once | INTRAVENOUS | Status: AC | PRN
  Administered 2023-06-01: 8 mL via INTRAVENOUS

## 2023-06-02 ENCOUNTER — Other Ambulatory Visit: Payer: Self-pay | Admitting: *Deleted

## 2023-06-05 ENCOUNTER — Inpatient Hospital Stay: Payer: Managed Care, Other (non HMO)

## 2023-06-05 DIAGNOSIS — E538 Deficiency of other specified B group vitamins: Secondary | ICD-10-CM | POA: Insufficient documentation

## 2023-06-05 DIAGNOSIS — Z87891 Personal history of nicotine dependence: Secondary | ICD-10-CM | POA: Insufficient documentation

## 2023-06-05 DIAGNOSIS — E039 Hypothyroidism, unspecified: Secondary | ICD-10-CM | POA: Insufficient documentation

## 2023-06-05 DIAGNOSIS — Z923 Personal history of irradiation: Secondary | ICD-10-CM | POA: Insufficient documentation

## 2023-06-05 DIAGNOSIS — Z9221 Personal history of antineoplastic chemotherapy: Secondary | ICD-10-CM | POA: Insufficient documentation

## 2023-06-05 DIAGNOSIS — K581 Irritable bowel syndrome with constipation: Secondary | ICD-10-CM | POA: Insufficient documentation

## 2023-06-05 DIAGNOSIS — Z79899 Other long term (current) drug therapy: Secondary | ICD-10-CM | POA: Insufficient documentation

## 2023-06-05 DIAGNOSIS — R519 Headache, unspecified: Secondary | ICD-10-CM | POA: Insufficient documentation

## 2023-06-05 DIAGNOSIS — Z79890 Hormone replacement therapy (postmenopausal): Secondary | ICD-10-CM | POA: Insufficient documentation

## 2023-06-05 DIAGNOSIS — Z7951 Long term (current) use of inhaled steroids: Secondary | ICD-10-CM | POA: Insufficient documentation

## 2023-06-05 DIAGNOSIS — M47814 Spondylosis without myelopathy or radiculopathy, thoracic region: Secondary | ICD-10-CM | POA: Insufficient documentation

## 2023-06-05 DIAGNOSIS — I700 Atherosclerosis of aorta: Secondary | ICD-10-CM | POA: Insufficient documentation

## 2023-06-05 DIAGNOSIS — R609 Edema, unspecified: Secondary | ICD-10-CM | POA: Insufficient documentation

## 2023-06-05 DIAGNOSIS — C3491 Malignant neoplasm of unspecified part of right bronchus or lung: Secondary | ICD-10-CM | POA: Insufficient documentation

## 2023-06-05 DIAGNOSIS — I10 Essential (primary) hypertension: Secondary | ICD-10-CM | POA: Insufficient documentation

## 2023-06-05 DIAGNOSIS — Z7952 Long term (current) use of systemic steroids: Secondary | ICD-10-CM | POA: Insufficient documentation

## 2023-06-05 DIAGNOSIS — C7931 Secondary malignant neoplasm of brain: Secondary | ICD-10-CM | POA: Insufficient documentation

## 2023-06-05 DIAGNOSIS — Z801 Family history of malignant neoplasm of trachea, bronchus and lung: Secondary | ICD-10-CM | POA: Insufficient documentation

## 2023-06-05 DIAGNOSIS — Z7982 Long term (current) use of aspirin: Secondary | ICD-10-CM | POA: Insufficient documentation

## 2023-06-05 DIAGNOSIS — E785 Hyperlipidemia, unspecified: Secondary | ICD-10-CM | POA: Insufficient documentation

## 2023-06-05 DIAGNOSIS — R161 Splenomegaly, not elsewhere classified: Secondary | ICD-10-CM | POA: Insufficient documentation

## 2023-06-05 DIAGNOSIS — K589 Irritable bowel syndrome without diarrhea: Secondary | ICD-10-CM | POA: Insufficient documentation

## 2023-06-05 DIAGNOSIS — R1310 Dysphagia, unspecified: Secondary | ICD-10-CM | POA: Insufficient documentation

## 2023-06-05 DIAGNOSIS — K219 Gastro-esophageal reflux disease without esophagitis: Secondary | ICD-10-CM | POA: Insufficient documentation

## 2023-06-06 ENCOUNTER — Inpatient Hospital Stay (HOSPITAL_BASED_OUTPATIENT_CLINIC_OR_DEPARTMENT_OTHER): Payer: Managed Care, Other (non HMO) | Admitting: Internal Medicine

## 2023-06-06 ENCOUNTER — Telehealth: Payer: Self-pay | Admitting: Radiation Oncology

## 2023-06-06 VITALS — BP 131/71 | HR 107 | Temp 98.0°F | Resp 16 | Wt 187.3 lb

## 2023-06-06 DIAGNOSIS — E785 Hyperlipidemia, unspecified: Secondary | ICD-10-CM | POA: Diagnosis not present

## 2023-06-06 DIAGNOSIS — M47814 Spondylosis without myelopathy or radiculopathy, thoracic region: Secondary | ICD-10-CM | POA: Diagnosis not present

## 2023-06-06 DIAGNOSIS — K589 Irritable bowel syndrome without diarrhea: Secondary | ICD-10-CM | POA: Diagnosis not present

## 2023-06-06 DIAGNOSIS — I700 Atherosclerosis of aorta: Secondary | ICD-10-CM | POA: Diagnosis not present

## 2023-06-06 DIAGNOSIS — R161 Splenomegaly, not elsewhere classified: Secondary | ICD-10-CM | POA: Diagnosis not present

## 2023-06-06 DIAGNOSIS — Z79899 Other long term (current) drug therapy: Secondary | ICD-10-CM | POA: Diagnosis not present

## 2023-06-06 DIAGNOSIS — C7931 Secondary malignant neoplasm of brain: Secondary | ICD-10-CM

## 2023-06-06 DIAGNOSIS — Z9221 Personal history of antineoplastic chemotherapy: Secondary | ICD-10-CM | POA: Diagnosis not present

## 2023-06-06 DIAGNOSIS — Z7951 Long term (current) use of inhaled steroids: Secondary | ICD-10-CM | POA: Diagnosis not present

## 2023-06-06 DIAGNOSIS — Z7982 Long term (current) use of aspirin: Secondary | ICD-10-CM | POA: Diagnosis not present

## 2023-06-06 DIAGNOSIS — E039 Hypothyroidism, unspecified: Secondary | ICD-10-CM | POA: Diagnosis not present

## 2023-06-06 DIAGNOSIS — R609 Edema, unspecified: Secondary | ICD-10-CM | POA: Diagnosis not present

## 2023-06-06 DIAGNOSIS — Z7952 Long term (current) use of systemic steroids: Secondary | ICD-10-CM | POA: Diagnosis not present

## 2023-06-06 DIAGNOSIS — K219 Gastro-esophageal reflux disease without esophagitis: Secondary | ICD-10-CM | POA: Diagnosis not present

## 2023-06-06 DIAGNOSIS — Z87891 Personal history of nicotine dependence: Secondary | ICD-10-CM | POA: Diagnosis not present

## 2023-06-06 DIAGNOSIS — C3431 Malignant neoplasm of lower lobe, right bronchus or lung: Secondary | ICD-10-CM | POA: Diagnosis not present

## 2023-06-06 DIAGNOSIS — Z923 Personal history of irradiation: Secondary | ICD-10-CM | POA: Diagnosis not present

## 2023-06-06 DIAGNOSIS — R519 Headache, unspecified: Secondary | ICD-10-CM | POA: Diagnosis not present

## 2023-06-06 DIAGNOSIS — K581 Irritable bowel syndrome with constipation: Secondary | ICD-10-CM | POA: Diagnosis not present

## 2023-06-06 DIAGNOSIS — Z79890 Hormone replacement therapy (postmenopausal): Secondary | ICD-10-CM | POA: Diagnosis not present

## 2023-06-06 DIAGNOSIS — E538 Deficiency of other specified B group vitamins: Secondary | ICD-10-CM | POA: Diagnosis not present

## 2023-06-06 DIAGNOSIS — Z510 Encounter for antineoplastic radiation therapy: Secondary | ICD-10-CM | POA: Diagnosis not present

## 2023-06-06 DIAGNOSIS — I10 Essential (primary) hypertension: Secondary | ICD-10-CM | POA: Diagnosis not present

## 2023-06-06 DIAGNOSIS — R1310 Dysphagia, unspecified: Secondary | ICD-10-CM | POA: Diagnosis not present

## 2023-06-06 NOTE — Telephone Encounter (Signed)
I called and spoke with the patient and his wife Joseph Atkins. We reviewed the imaging results from his recent MRI and our discussion in brain oncology conference. He has 8 lesions that are new in the brain. Dr. [name] would offer SRS, and Dr. Barbaraann Cao is also recommending a change to the patient's systemic therapy. The patient is open to proceed with SRS to the 8 lesions. He will come tomorrow for simulation tomorrow at which time he will sign consent to proceed.

## 2023-06-06 NOTE — Progress Notes (Signed)
[facility] at [facility] [address]  [place], [place] [address] [phone]   Interval Evaluation  Date of Service: 06/06/23 Patient Name: Joseph Atkins Patient MRN: [id number] Patient DOB: [birth year] Provider: Henreitta Leber, MD  Identifying Statement:  Joseph Atkins is a 63 y.o. male with Malignant neoplasm metastatic to brain [facility]) [C79.31]   Primary Cancer:  Oncologic History: Oncology History  Adenocarcinoma of right lung, stage 4 (HCC)  11/14/2019 Initial Diagnosis   Adenocarcinoma of right lung, stage 4 (HCC)   11/25/2019 - 01/06/2020 Chemotherapy   The patient had palonosetron (ALOXI) injection 0.25 mg, 0.25 mg, Intravenous,  Once, 7 of 7 cycles Administration: 0.25 mg (11/25/2019), 0.25 mg (12/23/2019), 0.25 mg (12/31/2019), 0.25 mg (12/02/2019), 0.25 mg (01/06/2020), 0.25 mg (12/09/2019), 0.25 mg (12/16/2019) CARBOplatin (PARAPLATIN) 250 mg in sodium chloride 0.9 % 250 mL chemo infusion, 249.8 mg (100 % of original dose 249.8 mg), Intravenous,  Once, 7 of 7 cycles Dose modification: 249.8 mg (original dose 249.8 mg, Cycle 1) Administration: 250 mg (11/25/2019), 250 mg (12/23/2019), 250 mg (12/31/2019), 250 mg (12/02/2019), 250 mg (01/06/2020), 270 mg (12/09/2019), 250 mg (12/16/2019) PACLitaxel (TAXOL) 90 mg in sodium chloride 0.9 % 250 mL chemo infusion (</= 80mg /m2), 45 mg/m2 = 90 mg, Intravenous,  Once, 7 of 7 cycles Administration: 90 mg (11/25/2019), 90 mg (12/23/2019), 90 mg (12/31/2019), 90 mg (12/02/2019), 90 mg (01/06/2020), 90 mg (12/09/2019), 90 mg (12/16/2019)  for chemotherapy treatment.    02/18/2020 - 03/23/2022 Chemotherapy   Patient is on Treatment Plan : LUNG NSCLC Pembrolizumab (200) q21d     03/31/2020 - 07/21/2020 Chemotherapy    Patient is on Treatment Plan: LUNG NSCLC FLAT DOSE PEMBROLIZUMAB Q21D      05/19/2020 Cancer Staging   Staging form: Lung, AJCC 8th Edition - Clinical: Stage IVB (cT3, cN0, cM1c) - Signed by Si Gaul, MD on  05/19/2020   11/16/2022 -  Chemotherapy   Patient is on Treatment Plan : LUNG NSCLC Pembrolizumab (200) q21d      CNS Oncologic History 11/14/19: Completes single frx SRS to 2.4cm cerebellar metastasis 03/05/20: SRS to 7 targets Joseph Atkins) 06/19/20: SRS to 6 additional targets Joseph Atkins) 12/22/22: SRS R occipital (Joseph Atkins) 03/03/23: Joseph Atkins)  Interval History:  Joseph Atkins presents today for follow up after recent MRI brain.  No significant changes described today.  Continues to have issues with balance, uses a cane. He remains on 1mg  daily decadron as prior.  No headaches or seizures.  Continues on immunotherapy infusions with Joseph Atkins.  Prior- He describes recurrence of balance issues from prior.  Symptoms declined when he stopped the decadron on 9/14.  He is needing to use a walker.  Also having more headaches and impaired memory.  He does complain of ongoing fatigue and low energy, not worse from prior. Continues Keytruda for disease recurrence with Joseph Atkins.  H+P (01/24/20) Patient presents today to discuss recent recurrence of neurologic symptoms following radiosurgery in July 2021.  He describes poor balance, wide based walking, needing to hold on to family or objects to walk safely.  He also describes nausea and dizzy-headed feeling at times.  Overall he is functioning in an impaired and sluggish way.  Symptoms have become noticeable since decreasing decadron to less than 4mg  daily; currently he is dosing 2mg  daily.  He felt "quite good" immediately after radiation and during higher dose steroid therapy.  No complications from decadron that he can report.  Recently completed chemoradioatherapy  induction for lung adenocarcinoma with Joseph Atkins.  Medications: Current Outpatient Medications on File Prior to Visit  Medication Sig Dispense Refill   albuterol (PROVENTIL HFA;VENTOLIN HFA) 108 (90 Base) MCG/ACT inhaler Inhale 2 puffs into the lungs every 6 (six) hours as needed for  wheezing or shortness of breath. 1 Inhaler 0   amLODipine (NORVASC) 5 MG tablet TAKE 1 TABLET (5 MG TOTAL) BY MOUTH DAILY. 90 tablet 1   ANORO ELLIPTA 62.5-25 MCG/INH AEPB 1 puff daily.     augmented betamethasone dipropionate (DIPROLENE-AF) 0.05 % cream Apply topically 2 (two) times daily.     azithromycin (ZITHROMAX Z-PAK) 250 MG tablet Take as directed 6 each 0   benzonatate (TESSALON) 100 MG capsule Take 1 capsule (100 mg total) by mouth 3 (three) times daily as needed. 30 capsule 2   [facility] ASPIRIN LOW DOSE 81 MG tablet TAKE 1 TABLET (81 MG TOTAL) BY MOUTH DAILY. SWALLOW WHOLE. 90 tablet 3   cyclobenzaprine (FLEXERIL) 10 MG tablet Take 10 mg by mouth 2 (two) times daily as needed for muscle spasms.     dexamethasone (DECADRON) 1 MG tablet Take 1 tablet (1 mg total) by mouth daily with breakfast. 60 tablet 1   docusate sodium (COLACE) 100 MG capsule Take 100 mg by mouth daily.     DULoxetine (CYMBALTA) 20 MG capsule Take 1 capsule (20 mg total) by mouth 2 (two) times daily. 60 capsule 5   fluticasone furoate-vilanterol (BREO ELLIPTA) 100-25 MCG/INH AEPB Inhale 1 puff into the lungs daily. 30 each 3   HYDROcodone bit-homatropine (HYCODAN) 5-1.5 MG/5ML syrup Take 5 mLs by mouth every 6 (six) hours as needed for cough. 473 mL 0   hydrOXYzine (ATARAX) 25 MG tablet Take 25 mg by mouth at bedtime.     levothyroxine (SYNTHROID) 100 MCG tablet TAKE 1 TABLET BY MOUTH EVERY DAY 90 tablet 1   lidocaine-prilocaine (EMLA) cream Apply 1 application topically as needed. 30 g 0   omeprazole (PRILOSEC) 40 MG capsule Take 1 capsule (40 mg total) by mouth daily. 90 capsule 4   ondansetron (ZOFRAN) 4 MG tablet Take 1 tablet (4 mg total) by mouth every 8 (eight) hours as needed. 40 tablet 2   oxyCODONE-acetaminophen (PERCOCET/ROXICET) 5-325 MG tablet Take 1 tablet by mouth every 4 (four) hours as needed for severe pain. 30 tablet 0   pembrolizumab (KEYTRUDA) 100 MG/4ML SOLN See admin instructions.     polyethylene  glycol powder (MIRALAX) powder Take 17 g by mouth daily. 255 g 11   prochlorperazine (COMPAZINE) 10 MG tablet Take 1 tablet (10 mg total) by mouth every 6 (six) hours as needed. 30 tablet 2   temazepam (RESTORIL) 30 MG capsule Take 1 capsule (30 mg total) by mouth at bedtime as needed for sleep. 30 capsule 0   triamcinolone cream (KENALOG) 0.1 % APPLY TOPICALLY 2 TIMES DAILY AS NEEDED. 454 g 0   TRULANCE 3 MG TABS Take 1 tablet by mouth daily.     No current facility-administered medications on file prior to visit.    Allergies:  Allergies  Allergen Reactions   Penicillins Other (See Comments)    Childhood allergy.  Has patient had a PCN reaction causing immediate rash, facial/tongue/throat swelling, SOB or lightheadedness with hypotension: unknown Has patient had a PCN reaction causing severe rash involving mucus membranes or skin necrosis: unknown Has patient had a PCN reaction that required hospitalization: unknown Has patient had a PCN reaction occurring within the last 10 years: no If  all of the above answers are "NO", then may proceed with Cephalosporin use.    Past Medical History:  Past Medical History:  Diagnosis Date   Cancer (HCC)    lung cancer   Diverticulosis    GERD (gastroesophageal reflux disease)    Hx of small bowel obstruction    Hyperlipidemia    Hypertension    Hypothyroidism    IBS (irritable bowel syndrome)    Substance abuse (HCC)    Alcoholic, Drug addition   Thyroid disease    Past Surgical History:  Past Surgical History:  Procedure Laterality Date   arm surgery Right    BRONCHIAL BRUSHINGS  10/24/2019   Procedure: BRONCHIAL BRUSHINGS;  Surgeon: Leslye Peer, MD;  Location: [facility] ENDOSCOPY;  Service: Cardiopulmonary;;  right lower lobe    BRONCHIAL BRUSHINGS  11/05/2019   Procedure: BRONCHIAL BRUSHINGS;  Surgeon: Leslye Peer, MD;  Location: [facility] ENDOSCOPY;  Service: Pulmonary;;   BRONCHIAL NEEDLE ASPIRATION BIOPSY  10/24/2019   Procedure:  BRONCHIAL NEEDLE ASPIRATION BIOPSIES;  Surgeon: Leslye Peer, MD;  Location: [facility] ENDOSCOPY;  Service: Cardiopulmonary;;   BRONCHIAL NEEDLE ASPIRATION BIOPSY  11/05/2019   Procedure: BRONCHIAL NEEDLE ASPIRATION BIOPSIES;  Surgeon: Leslye Peer, MD;  Location[facility] ENDOSCOPY;  Service: Pulmonary;;   ENDOBRONCHIAL ULTRASOUND N/A 10/24/2019   Procedure: ENDOBRONCHIAL ULTRASOUND;  Surgeon: Leslye Peer, MD;  Location: [facility] ENDOSCOPY;  Service: Cardiopulmonary;  Laterality: N/A;   FINGER SURGERY Right    Middle   IR IMAGING GUIDED PORT INSERTION  11/19/2019   VIDEO BRONCHOSCOPY N/A 10/24/2019   Procedure: VIDEO BRONCHOSCOPY WITHOUT FLUORO;  Surgeon: Leslye Peer, MD;  Location: [facility] ENDOSCOPY;  Service: Cardiopulmonary;  Laterality: N/A;   VIDEO BRONCHOSCOPY WITH ENDOBRONCHIAL NAVIGATION N/A 11/05/2019   Procedure: VIDEO BRONCHOSCOPY WITH ENDOBRONCHIAL NAVIGATION;  Surgeon: Leslye Peer, MD;  Location: [facility] ENDOSCOPY;  Service: Pulmonary;  Laterality: N/A;   Social History:  Social History   Socioeconomic History   Marital status: Married    Spouse name: Not on file   Number of children: 2   Years of education: Not on file   Highest education level: Not on file  Occupational History   Occupation: supervisor    Employer: [employer]  Tobacco Use   Smoking status: Former    Current packs/day: 1.50    Types: Cigarettes   Smokeless tobacco: Former    Types: Associate Professor status: Never Used  Substance and Sexual Activity   Alcohol use: No    Comment: Alcoholic clean for 6 years   Drug use: No    Comment: Recovering Drug Addict-clean for 6 years   Sexual activity: Not on file  Other Topics Concern   Not on file  Social History Narrative   Not on file   Social Drivers of Health   Financial Resource Strain: Medium Risk (10/31/2019)   Overall Financial Resource Strain (CARDIA)    Difficulty of Paying Living Expenses: Somewhat hard  Food Insecurity: No Food Insecurity  (12/13/2022)   Hunger Vital Sign    Worried About Running Out of Food in the Last Year: Never true    Ran Out of Food in the Last Year: Never true  Transportation Needs: No Transportation Needs (12/13/2022)   PRAPARE - Administrator, Civil Service (Medical): No    Lack of Transportation (Non-Medical): No  Physical Activity: Insufficiently Active (10/31/2019)   Exercise Vital Sign    Days of Exercise per Week:  5 days    Minutes of Exercise per Session: 20 min  Stress: Stress Concern Present (10/31/2019)   [employer] of Occupational Health - Occupational Stress Questionnaire    Feeling of Stress : Very much  Social Connections: Moderately Isolated (10/31/2019)   Social Connection and Isolation Panel [NHANES]    Frequency of Communication with Friends and Family: More than three times a week    Frequency of Social Gatherings with Friends and Family: More than three times a week    Attends Religious Services: Never    Database administrator or Organizations: No    Attends Banker Meetings: Never    Marital Status: Married  Catering manager Violence: Not At Risk (12/13/2022)   Humiliation, Afraid, Rape, and Kick questionnaire    Fear of Current or Ex-Partner: No    Emotionally Abused: No    Physically Abused: No    Sexually Abused: No   Family History:  Family History  Problem Relation Age of Onset   Epilepsy Mother    Heart disease Mother    Heart disease Brother    Lung cancer Paternal Uncle     Review of Systems: Constitutional: Doesn't report fevers, chills or abnormal weight loss Eyes: Doesn't report blurriness of vision Ears, nose, mouth, throat, and face: Doesn't report sore throat Respiratory: Doesn't report cough, dyspnea or wheezes Cardiovascular: Doesn't report palpitation, chest discomfort  Gastrointestinal:  Doesn't report nausea, constipation, diarrhea GU: Doesn't report incontinence Skin: Doesn't report skin rashes Neurological: Per  HPI Musculoskeletal: Doesn't report joint pain Behavioral/Psych: Doesn't report anxiety  Physical Exam: Wt Readings from Last 3 Encounters:  05/25/23 189 lb 1.6 oz (85.8 kg)  05/04/23 188 lb (85.3 kg)  04/13/23 181 lb 4.8 oz (82.2 kg)   Temp Readings from Last 3 Encounters:  05/25/23 98.2 F (36.8 C) (Oral)  05/25/23 97.7 F (36.5 C) (Temporal)  05/04/23 97.9 F (36.6 C) (Temporal)   BP Readings from Last 3 Encounters:  05/25/23 111/72  05/25/23 131/79  05/04/23 132/74   Pulse Readings from Last 3 Encounters:  05/25/23 90  05/25/23 (!) 105  05/04/23 96     KPS: 70. General: Alert, cooperative, pleasant, in no acute distress Head: Normal EENT: No conjunctival injection or scleral icterus.  Lungs: Resp effort normal Cardiac: Regular rate Abdomen: Non-distended abdomen Skin: No rashes cyanosis or petechiae. Extremities: No clubbing or edema  Neurologic Exam: Mental Status: Awake, alert, attentive to examiner. Oriented to self and environment. Language is fluent with intact comprehension.  Cranial Nerves: Visual acuity is grossly normal. Visual fields are full. Extra-ocular movements intact. No ptosis. Face is symmetric Motor: Tone and bulk are normal. Power is 4/5 in right leg. Reflexes are symmetric, no pathologic reflexes present.  Dysmetria L>R Sensory: Intact to light touch Gait: Dystaxic, wide based   Labs: I have reviewed the data as listed    Component Value Date/Time   NA 133 (L) 05/25/2023 0847   NA 141 03/08/2017 1707   K 4.1 05/25/2023 0847   CL 100 05/25/2023 0847   CO2 26 05/25/2023 0847   GLUCOSE 180 (H) 05/25/2023 0847   BUN 8 05/25/2023 0847   BUN 12 03/08/2017 1707   CREATININE 1.01 05/25/2023 0847   CALCIUM 8.4 (L) 05/25/2023 0847   PROT 6.5 05/25/2023 0847   PROT 7.1 03/08/2017 1707   ALBUMIN 3.8 05/25/2023 0847   ALBUMIN 4.5 03/08/2017 1707   AST 18 05/25/2023 0847   ALT 14 05/25/2023 0847  ALKPHOS 75 05/25/2023 0847   BILITOT 0.6  05/25/2023 0847   GFRNONAA >60 05/25/2023 0847   GFRAA >60 01/06/2020 0837   Lab Results  Component Value Date   WBC 11.1 (H) 05/25/2023   NEUTROABS 9.7 (H) 05/25/2023   HGB 11.9 (L) 05/25/2023   HCT 37.2 (L) 05/25/2023   MCV 91.4 05/25/2023   PLT 248 05/25/2023   Imaging:  [facility] Clinician Interpretation: I have personally reviewed the CNS images as listed.  My interpretation, in the context of the patient's clinical presentation, is progressive disease  MR BRAIN W WO CONTRAST Result Date: 06/01/2023 CLINICAL DATA:  Brain/CNS neoplasm, assess treatment response. Metastatic lung cancer treated with SRS. EXAM: MRI HEAD WITHOUT AND WITH CONTRAST TECHNIQUE: Multiplanar, multiecho pulse sequences of the brain and surrounding structures were obtained without and with intravenous contrast. CONTRAST:  8 mL Vueway Contrast was administered via a port which was accessed by a Designer, jewellery. COMPARISON:  Head MRI 02/22/2023 FINDINGS: Brain: New lesions: 1. 2 mm lesion in the inferior cerebellar vermis (series 13, image 31). 2. 1-2 mm lesion in the inferior right occipital lobe (series 13, image 58). 3. 2 mm lesion in the posterosuperior right temporal lobe (series 13, image 70). 4. 2 mm lesion in the medial left occipital pole posterior to the stable curvilinear enhancement (series 13, image 73). 5. 5 mm enhancing lesion superiorly in the left occipital lobe with mild edema (series 13, image 86). 6. 2 mm faintly enhancing lesion in the left occipital lobe posterior and superior to the above lesion (series 13, image 92). 7. 2 mm faintly enhancing lesion in the left middle frontal gyrus (series 13, image 104). 8. 2 mm faintly enhancing lesion in the left frontal lobe anterior to the operculum (series 13, image 76 and series 15, image 37). Larger lesions: 1. Increased enhancement at the site of a previously punctate lesion in the medial right occipital lobe, with enhancement now being partly curvilinear and  measuring 8 mm in length (series 13, image 73). 2. Increased enhancement at the site of a previously 3 mm lesion in the right temporal lobe which has a nodular appearance on axial images and a more curvilinear/platelike appearance on sagittal and coronal images with enhancement nail spanning 8 mm in total (series 13, image 62). Stable or smaller lesions: 1. 2.6 x 2.0 cm lesion in the left cerebellar hemisphere containing chronic blood products, unchanged in size and with unchanged surrounding T2 hyperintensity extending into the middle cerebellar peduncle (series 13, image 28). 2. 3 mm lesion posteriorly in the right occipital lobe, unchanged (series 13, image 79). 3. The punctate right parietal lesion which was new on the prior study is no longer visualized. 4. 6 mm lesion in the right precentral gyrus, unchanged (series 13, image 115). 5. 4 mm focus of curvilinear enhancement in the medial left occipital lobe, unchanged (series 13, image 70). 6. 6 mm focus of curvilinear enhancement more superiorly in the left occipital lobe, unchanged (series 13, image 87). 7. 9 mm lesion in the left precentral gyrus at the vertex, unchanged (series 13, image 133). Other brain findings: There is no evidence of an acute infarct, midline shift, or extra-axial fluid collection. There is mild cerebral atrophy. Patchy T2 hyperintensities in the cerebral white matter bilaterally are nonspecific but compatible with mild-to-moderate chronic small vessel ischemic disease, similar to the prior study. Vascular: Major intracranial vascular flow voids are preserved. Skull and upper cervical spine: No suspicious marrow lesion. Sinuses/Orbits: Unremarkable orbits. Mild  mucosal thickening in the paranasal sinuses. Trace left mastoid fluid. Other: None. IMPRESSION: 1. Eight new enhancing supratentorial lesions as detailed above. The largest measures 5 mm in the superior left occipital lobe with mild edema. 2. Increased enhancement at the sites of  pre-existing right occipital and right temporal lobe lesions, possibly post treatment changes given partially curvilinear nature. Attention on follow-up. 3. Stable or decreased size of all other lesions. Electronically Signed   By: Sebastian Ache M.D.   On: 06/01/2023 14:06    Assessment/Plan Malignant neoplasm metastatic to brain [facility]) [C79.31]  Joseph Atkins is clinically stable today from neurologic standpoint.  MRI brain demonstrates ~8 novel metastases; these were analyzed by RT physics team, all of them are outside prior treatment fields c/w new disease.  He is agreeable with salvage SRS, case was discussed with Dr. Mitzi Atkins earlier this week.  Decadron may continue at 1mg  daily, he should try to decrease this to 0.5mg  daily after radiation if tolerated, then stop if tolerated.    He will meet with Joseph Atkins next week to discuss systemic therapy plans in light of ongoing progression in CNS compartment.  We ask that Joseph Atkins return to clinic in 3 months following post-SRS brain MRI, or sooner as needed.  We appreciate the opportunity to participate in the care of Joseph Atkins.   All questions were answered. The patient knows to call the clinic with any problems, questions or concerns. No barriers to learning were detected.  The total time spent in the encounter was 40 minutes and more than 50% was on counseling and review of test results   Henreitta Leber, MD Medical Director of Neuro-Oncology [facility] 06/06/23 10:09 AM

## 2023-06-07 ENCOUNTER — Ambulatory Visit
Admission: RE | Admit: 2023-06-07 | Discharge: 2023-06-07 | Disposition: A | Discharge status: Home / Self Care | Payer: Managed Care, Other (non HMO) | Source: Ambulatory Visit | Attending: Radiation Oncology | Admitting: Radiation Oncology

## 2023-06-07 ENCOUNTER — Other Ambulatory Visit: Payer: Self-pay

## 2023-06-07 ENCOUNTER — Telehealth: Payer: Self-pay | Admitting: Internal Medicine

## 2023-06-07 ENCOUNTER — Other Ambulatory Visit: Payer: Self-pay | Admitting: Radiation Therapy

## 2023-06-07 VITALS — BP 122/81 | HR 105 | Temp 97.1°F | Resp 18 | Ht 68.0 in | Wt 186.1 lb

## 2023-06-07 DIAGNOSIS — E785 Hyperlipidemia, unspecified: Secondary | ICD-10-CM | POA: Insufficient documentation

## 2023-06-07 DIAGNOSIS — C7931 Secondary malignant neoplasm of brain: Secondary | ICD-10-CM

## 2023-06-07 DIAGNOSIS — Z79899 Other long term (current) drug therapy: Secondary | ICD-10-CM | POA: Insufficient documentation

## 2023-06-07 DIAGNOSIS — Z510 Encounter for antineoplastic radiation therapy: Secondary | ICD-10-CM | POA: Insufficient documentation

## 2023-06-07 DIAGNOSIS — Z923 Personal history of irradiation: Secondary | ICD-10-CM | POA: Insufficient documentation

## 2023-06-07 DIAGNOSIS — Z79890 Hormone replacement therapy (postmenopausal): Secondary | ICD-10-CM | POA: Insufficient documentation

## 2023-06-07 DIAGNOSIS — K589 Irritable bowel syndrome without diarrhea: Secondary | ICD-10-CM | POA: Insufficient documentation

## 2023-06-07 DIAGNOSIS — E538 Deficiency of other specified B group vitamins: Secondary | ICD-10-CM | POA: Insufficient documentation

## 2023-06-07 DIAGNOSIS — Z87891 Personal history of nicotine dependence: Secondary | ICD-10-CM | POA: Insufficient documentation

## 2023-06-07 DIAGNOSIS — Z9221 Personal history of antineoplastic chemotherapy: Secondary | ICD-10-CM | POA: Insufficient documentation

## 2023-06-07 DIAGNOSIS — I10 Essential (primary) hypertension: Secondary | ICD-10-CM | POA: Insufficient documentation

## 2023-06-07 DIAGNOSIS — K219 Gastro-esophageal reflux disease without esophagitis: Secondary | ICD-10-CM | POA: Insufficient documentation

## 2023-06-07 DIAGNOSIS — Z7982 Long term (current) use of aspirin: Secondary | ICD-10-CM | POA: Insufficient documentation

## 2023-06-07 DIAGNOSIS — Z801 Family history of malignant neoplasm of trachea, bronchus and lung: Secondary | ICD-10-CM | POA: Insufficient documentation

## 2023-06-07 DIAGNOSIS — C3431 Malignant neoplasm of lower lobe, right bronchus or lung: Secondary | ICD-10-CM | POA: Insufficient documentation

## 2023-06-07 DIAGNOSIS — R1310 Dysphagia, unspecified: Secondary | ICD-10-CM | POA: Insufficient documentation

## 2023-06-07 DIAGNOSIS — R519 Headache, unspecified: Secondary | ICD-10-CM | POA: Insufficient documentation

## 2023-06-07 DIAGNOSIS — K581 Irritable bowel syndrome with constipation: Secondary | ICD-10-CM | POA: Insufficient documentation

## 2023-06-07 DIAGNOSIS — M47814 Spondylosis without myelopathy or radiculopathy, thoracic region: Secondary | ICD-10-CM | POA: Insufficient documentation

## 2023-06-07 DIAGNOSIS — R609 Edema, unspecified: Secondary | ICD-10-CM | POA: Insufficient documentation

## 2023-06-07 DIAGNOSIS — R161 Splenomegaly, not elsewhere classified: Secondary | ICD-10-CM | POA: Insufficient documentation

## 2023-06-07 DIAGNOSIS — Z7951 Long term (current) use of inhaled steroids: Secondary | ICD-10-CM | POA: Insufficient documentation

## 2023-06-07 DIAGNOSIS — E039 Hypothyroidism, unspecified: Secondary | ICD-10-CM | POA: Insufficient documentation

## 2023-06-07 DIAGNOSIS — I700 Atherosclerosis of aorta: Secondary | ICD-10-CM | POA: Insufficient documentation

## 2023-06-07 DIAGNOSIS — Z7952 Long term (current) use of systemic steroids: Secondary | ICD-10-CM | POA: Insufficient documentation

## 2023-06-07 MED ORDER — SODIUM CHLORIDE 0.9% FLUSH
10.0000 mL | Freq: Once | INTRAVENOUS | Status: AC
  Administered 2023-06-07: 10 mL via INTRAVENOUS

## 2023-06-07 MED ORDER — HEPARIN SOD (PORK) LOCK FLUSH 100 UNIT/ML IV SOLN
500.0000 [IU] | Freq: Once | INTRAVENOUS | Status: AC
  Administered 2023-06-07: 500 [IU] via INTRAVENOUS

## 2023-06-07 NOTE — Progress Notes (Signed)
Order entered for port access the day of brain MRI at [facility].  Scan scheduled 5/19

## 2023-06-07 NOTE — Telephone Encounter (Signed)
Joseph Atkins

## 2023-06-07 NOTE — Progress Notes (Signed)
Has armband been applied?  Yes  Does patient have an allergy to IV contrast dye?: No   Has patient ever received premedication for IV contrast dye?:  n/a  Does patient take metformin?: No  If patient does take metformin when was the last dose: n/a  Date of lab work: 05/25/2023 BUN: 8 CR: 1.01 eGfr: >60  IV site: Right Chest Port  Has IV site been added to flowsheet?  Yes  BP 122/81 (BP Location: Left Arm, Patient Position: Sitting)   Pulse (!) 105   Temp (!) 97.1 F (36.2 C) (Temporal)   Resp 18   Ht 5\' 8"  (1.727 m)   Wt 186 lb 2 oz (84.4 kg)   SpO2 98%   BMI 28.30 kg/m

## 2023-06-08 ENCOUNTER — Other Ambulatory Visit: Payer: Self-pay | Admitting: Internal Medicine

## 2023-06-08 ENCOUNTER — Other Ambulatory Visit: Payer: Self-pay

## 2023-06-09 ENCOUNTER — Ambulatory Visit (HOSPITAL_COMMUNITY)
Admission: RE | Admit: 2023-06-09 | Discharge: 2023-06-09 | Disposition: A | Discharge status: Home / Self Care | Payer: Managed Care, Other (non HMO) | Source: Ambulatory Visit | Attending: Physician Assistant | Admitting: Physician Assistant

## 2023-06-09 ENCOUNTER — Encounter (HOSPITAL_COMMUNITY): Payer: Self-pay

## 2023-06-09 DIAGNOSIS — C3491 Malignant neoplasm of unspecified part of right bronchus or lung: Secondary | ICD-10-CM | POA: Diagnosis present

## 2023-06-09 MED ORDER — IOHEXOL 300 MG/ML  SOLN
100.0000 mL | Freq: Once | INTRAMUSCULAR | Status: AC | PRN
  Administered 2023-06-09: 100 mL via INTRAVENOUS

## 2023-06-09 MED ORDER — HEPARIN SOD (PORK) LOCK FLUSH 100 UNIT/ML IV SOLN
500.0000 [IU] | Freq: Once | INTRAVENOUS | Status: AC
  Administered 2023-06-09: 500 [IU] via INTRAVENOUS

## 2023-06-13 ENCOUNTER — Encounter: Payer: Self-pay | Admitting: Internal Medicine

## 2023-06-13 ENCOUNTER — Encounter: Payer: Self-pay | Admitting: Physician Assistant

## 2023-06-13 DIAGNOSIS — C7931 Secondary malignant neoplasm of brain: Secondary | ICD-10-CM | POA: Diagnosis not present

## 2023-06-14 ENCOUNTER — Ambulatory Visit: Payer: Managed Care, Other (non HMO) | Admitting: Radiation Oncology

## 2023-06-15 ENCOUNTER — Inpatient Hospital Stay: Payer: Managed Care, Other (non HMO)

## 2023-06-15 ENCOUNTER — Ambulatory Visit: Payer: Managed Care, Other (non HMO) | Admitting: Radiation Oncology

## 2023-06-15 ENCOUNTER — Inpatient Hospital Stay: Payer: Managed Care, Other (non HMO) | Admitting: Internal Medicine

## 2023-06-16 NOTE — Progress Notes (Deleted)
 [facility] OFFICE PROGRESS NOTE  [name], NP [address] [name] [address] [place] [address]  DIAGNOSIS: Stage IV (T3, N0, M1C) non-small cell lung cancer, adenocarcinoma.  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 He was diagnosed in July 2021.   Molecular Biomarkers:  MSI-High DETECTED Pembrolizumab Atezolizumab, Avelumab, Cemiplimab, Dostarlimab, Durvalumab, Ipilimumab, Nivolumab   STK11Splice Site SNV 1.9% Everolimus, Temsirolimus Yes   KRASG12D 1.7% Binimetinib Yes   [id number] 0.4%   Niraparib, Olaparib, Rucaparib, Talazoparib, Tazemetostat Yes  PRIOR THERAPY: 1) SRS to the solitary brain metastasis under the care of Dr. [name]. Last treatment 11/14/19. 2) Weekly concurrent chemoradiation with carboplatin for an AUC of 2, paclitaxel 45 mg/m2.  First dose expected on 11/25/2019. Status post 7 cycles, last dose was given 01/06/2020 with partial response.  3)  Immunotherapy with Keytruda 200 mg IV every 3 weeks.  First dose February 10, 2020 for a patient with MSI high.  Status post 35  cycles. 4) Avastin 15 mg/KG every 3 weeks.  First dose today for the vasogenic edema of the brain.S/P 7 cycles. 5) The patient had evidence for local disease recurrence in July 2024. 6) SRS to the occipital brain lesion under the care of Dr. [name] on 12/22/22 7) 2) UHRT under the care of Dr. [name], last dose expected on 03/03/23 8) SRS to the new metastatic brain lesion on 03/07/23 9) Resuming his treatment with Keytruda 200 Mg IV every 3 weeks. First dose 11/16/2022. Status post 10 cycles  10) SRS to the new metastatic brain lesions under the care of Dr. [name], completed on 06/21/2023.  CURRENT THERAPY: ***Resuming palliative systemic chemotherapy with carboplatin for an AUC of 5, alimta 500 mg/m, and Keytruda. First dose on ***  INTERVAL HISTORY: Joseph Atkins 63 y.o. male  returns to clinic today for follow-up visit accompanied by his wife.  In summary, the patient is found to have evidence of disease progression in the central aspect of the right lower lobe concerning for local recurrence.  Therefore he was resumed on systemic treatment with Keytruda.  He tolerates his Keytruda well.  Unfortunately, the patient has been having a lot of recurrent metastatic disease to the brain.  He saw Dr. Barbaraann Cao on 06/06/2023.  Fortunately, the patient had a repeat brain MRI that showed 8 novel metastases.  He is planning on undergoing salvage SRS under the care of Dr. [name].  This was completed today on 06/21/2023.  Vaslow recommended rediscussing systemic therapy options in light of ongoing progression in the CNS compartment.  As last being seen, the patient is feeling ***.  Is currently taking Decadron 1 mg daily and he will try and reduce this to 0.5 mg daily after radiation if tolerated and stop if tolerated.  Otherwise, he is doing fair. I gave him hycodan at a prior appointment and his cough had improved. Overall, his cough is still not like it was. He gets cough every once in awhile. Denies any associated fevers, night sweats, hemoptysis, or signs of infection. Denies changes in his shortness of breath which he may get with exertion. He is trying to make sure he uses his rescue inhaler if needed.  Denies any nausea, vomiting, or diarrhea.  Patient struggles with chronic constipation.  He has IBS constipation subtype.  He is on medication for this prescribed by his PCP called [facility].  He is taking laxatives every day.  He had to use a suppository recently. He drinks a lot of water and tries to eat apples daily. He used to take stool softeners but not lately and he is going to consider going back to stool softeners in addition to laxatives if needed.  Denies any new rashes or itching.  He recently had a restaging CT scan performed. He is here today for evaluation and to review his scan  results.     MEDICAL HISTORY: Past Medical History:  Diagnosis Date   Cancer (HCC)    lung cancer   Diverticulosis    GERD (gastroesophageal reflux disease)    Hx of small bowel obstruction    Hyperlipidemia    Hypertension    Hypothyroidism    IBS (irritable bowel syndrome)    Substance abuse (HCC)    Alcoholic, Drug addition   Thyroid disease     ALLERGIES:  is allergic to penicillins.  MEDICATIONS:  Current Outpatient Medications  Medication Sig Dispense Refill   albuterol (PROVENTIL HFA;VENTOLIN HFA) 108 (90 Base) MCG/ACT inhaler Inhale 2 puffs into the lungs every 6 (six) hours as needed for wheezing or shortness of breath. 1 Inhaler 0   amLODipine (NORVASC) 5 MG tablet TAKE 1 TABLET (5 MG TOTAL) BY MOUTH DAILY. 90 tablet 1   ANORO ELLIPTA 62.5-25 MCG/INH AEPB 1 puff daily.     augmented betamethasone dipropionate (DIPROLENE-AF) 0.05 % cream Apply topically 2 (two) times daily.     azithromycin (ZITHROMAX Z-PAK) 250 MG tablet Take as directed 6 each 0   benzonatate (TESSALON) 100 MG capsule Take 1 capsule (100 mg total) by mouth 3 (three) times daily as needed. 30 capsule 2   [employer] ASPIRIN LOW DOSE 81 MG tablet TAKE 1 TABLET (81 MG TOTAL) BY MOUTH DAILY. SWALLOW WHOLE. 90 tablet 3   cyclobenzaprine (FLEXERIL) 10 MG tablet Take 10 mg by mouth 2 (two) times daily as needed for muscle spasms.     dexamethasone (DECADRON) 1 MG tablet Take 1 tablet (1 mg total) by mouth daily with breakfast. 60 tablet 1   docusate sodium (COLACE) 100 MG capsule Take 100 mg by mouth daily.     DULoxetine (CYMBALTA) 20 MG capsule Take 1 capsule (20 mg total) by mouth 2 (two) times daily. 60 capsule 5   fluticasone furoate-vilanterol (BREO ELLIPTA) 100-25 MCG/INH AEPB Inhale 1 puff into the lungs daily. 30 each 3   HYDROcodone bit-homatropine (HYCODAN) 5-1.5 MG/5ML syrup Take 5 mLs by mouth every 6 (six) hours as needed for cough. 473 mL 0   hydrOXYzine (ATARAX) 25 MG tablet Take 25 mg by mouth at  bedtime.     levothyroxine (SYNTHROID) 100 MCG tablet TAKE 1 TABLET BY MOUTH EVERY DAY 90 tablet 1   lidocaine-prilocaine (EMLA) cream Apply 1 application topically as needed. 30 g 0   omeprazole (PRILOSEC) 40 MG capsule Take 1 capsule (40 mg total) by mouth daily. 90 capsule 4   ondansetron (ZOFRAN) 4 MG tablet Take 1 tablet (4 mg total) by mouth every 8 (eight) hours as needed. 40 tablet 2   oxyCODONE-acetaminophen (PERCOCET/ROXICET) 5-325 MG tablet Take 1 tablet by mouth every 4 (four) hours as needed for severe pain. 30 tablet 0   pembrolizumab (KEYTRUDA) 100 MG/4ML SOLN See admin instructions.     polyethylene glycol powder (MIRALAX) powder Take 17 g by mouth daily. 255 g 11   prochlorperazine (COMPAZINE) 10 MG tablet Take 1 tablet (10 mg total) by mouth every 6 (six) hours as needed. 30  tablet 2   temazepam (RESTORIL) 30 MG capsule Take 1 capsule (30 mg total) by mouth at bedtime as needed for sleep. 30 capsule 0   triamcinolone cream (KENALOG) 0.1 % APPLY TOPICALLY 2 TIMES DAILY AS NEEDED. 454 g 0   TRULANCE 3 MG TABS Take 1 tablet by mouth daily.     No current facility-administered medications for this visit.    SURGICAL HISTORY:  Past Surgical History:  Procedure Laterality Date   arm surgery Right    BRONCHIAL BRUSHINGS  10/24/2019   Procedure: BRONCHIAL BRUSHINGS;  Surgeon: Leslye Peer, MD;  Location: [facility] ENDOSCOPY;  Service: Cardiopulmonary;;  right lower lobe    BRONCHIAL BRUSHINGS  11/05/2019   Procedure: BRONCHIAL BRUSHINGS;  Surgeon: Leslye Peer, MD;  Location: [facility] ENDOSCOPY;  Service: Pulmonary;;   BRONCHIAL NEEDLE ASPIRATION BIOPSY  10/24/2019   Procedure: BRONCHIAL NEEDLE ASPIRATION BIOPSIES;  Surgeon: Leslye Peer, MD;  Location: [facility] ENDOSCOPY;  Service: Cardiopulmonary;;   BRONCHIAL NEEDLE ASPIRATION BIOPSY  11/05/2019   Procedure: BRONCHIAL NEEDLE ASPIRATION BIOPSIES;  Surgeon: Leslye Peer, MD;  Location: [facility] ENDOSCOPY;  Service: Pulmonary;;   ENDOBRONCHIAL  ULTRASOUND N/A 10/24/2019   Procedure: ENDOBRONCHIAL ULTRASOUND;  Surgeon: Leslye Peer, MD;  Location: [facility] ENDOSCOPY;  Service: Cardiopulmonary;  Laterality: N/A;   FINGER SURGERY Right    Middle   IR IMAGING GUIDED PORT INSERTION  11/19/2019   VIDEO BRONCHOSCOPY N/A 10/24/2019   Procedure: VIDEO BRONCHOSCOPY WITHOUT FLUORO;  Surgeon: Leslye Peer, MD;  Location: [facility] ENDOSCOPY;  Service: Cardiopulmonary;  Laterality: N/A;   VIDEO BRONCHOSCOPY WITH ENDOBRONCHIAL NAVIGATION N/A 11/05/2019   Procedure: VIDEO BRONCHOSCOPY WITH ENDOBRONCHIAL NAVIGATION;  Surgeon: Leslye Peer, MD;  Location: [facility] ENDOSCOPY;  Service: Pulmonary;  Laterality: N/A;    REVIEW OF SYSTEMS:   Review of Systems  Constitutional: Negative for appetite change, chills, fatigue, fever and unexpected weight change.  HENT:   Negative for mouth sores, nosebleeds, sore throat and trouble swallowing.   Eyes: Negative for eye problems and icterus.  Respiratory: Negative for cough, hemoptysis, shortness of breath and wheezing.   Cardiovascular: Negative for chest pain and leg swelling.  Gastrointestinal: Negative for abdominal pain, constipation, diarrhea, nausea and vomiting.  Genitourinary: Negative for bladder incontinence, difficulty urinating, dysuria, frequency and hematuria.   Musculoskeletal: Negative for back pain, gait problem, neck pain and neck stiffness.  Skin: Negative for itching and rash.  Neurological: Negative for dizziness, extremity weakness, gait problem, headaches, light-headedness and seizures.  Hematological: Negative for adenopathy. Does not bruise/bleed easily.  Psychiatric/Behavioral: Negative for confusion, depression and sleep disturbance. The patient is not nervous/anxious.     PHYSICAL EXAMINATION:  There were no vitals taken for this visit.  ECOG PERFORMANCE STATUS: {CHL ONC ECOG [id number]  Physical Exam  Constitutional: Oriented to person, place, and time and well-developed,  well-nourished, and in no distress. No distress.  HENT:  Head: Normocephalic and atraumatic.  Mouth/Throat: Oropharynx is clear and moist. No oropharyngeal exudate.  Eyes: Conjunctivae are normal. Right eye exhibits no discharge. Left eye exhibits no discharge. No scleral icterus.  Neck: Normal range of motion. Neck supple.  Cardiovascular: Normal rate, regular rhythm, normal heart sounds and intact distal pulses.   Pulmonary/Chest: Effort normal and breath sounds normal. No respiratory distress. No wheezes. No rales.  Abdominal: Soft. Bowel sounds are normal. Exhibits no distension and no mass. There is no tenderness.  Musculoskeletal: Normal range of motion. Exhibits no edema.  Lymphadenopathy:    No cervical adenopathy.  Neurological: Alert and oriented to person, place, and time. Exhibits normal muscle tone. Gait normal. Coordination normal.  Skin: Skin is warm and dry. No rash noted. Not diaphoretic. No erythema. No pallor.  Psychiatric: Mood, memory and judgment normal.  Vitals reviewed.  LABORATORY DATA: Lab Results  Component Value Date   WBC 11.1 (H) 05/25/2023   HGB 11.9 (L) 05/25/2023   HCT 37.2 (L) 05/25/2023   MCV 91.4 05/25/2023   PLT 248 05/25/2023      Chemistry      Component Value Date/Time   NA 133 (L) 05/25/2023 0847   NA 141 03/08/2017 1707   K 4.1 05/25/2023 0847   CL 100 05/25/2023 0847   CO2 26 05/25/2023 0847   BUN 8 05/25/2023 0847   BUN 12 03/08/2017 1707   CREATININE 1.01 05/25/2023 0847      Component Value Date/Time   CALCIUM 8.4 (L) 05/25/2023 0847   ALKPHOS 75 05/25/2023 0847   AST 18 05/25/2023 0847   ALT 14 05/25/2023 0847   BILITOT 0.6 05/25/2023 0847       RADIOGRAPHIC STUDIES:  MR BRAIN W WO CONTRAST Result Date: 06/01/2023 CLINICAL DATA:  Brain/CNS neoplasm, assess treatment response. Metastatic lung cancer treated with SRS. EXAM: MRI HEAD WITHOUT AND WITH CONTRAST TECHNIQUE: Multiplanar, multiecho pulse sequences of the brain  and surrounding structures were obtained without and with intravenous contrast. CONTRAST:  8 mL Vue[employer] Contrast was administered via a port which was accessed by a Designer, jewellery. COMPARISON:  Head MRI 02/22/2023 FINDINGS: Brain: New lesions: 1. 2 mm lesion in the inferior cerebellar vermis (series 13, image 31). 2. 1-2 mm lesion in the inferior right occipital lobe (series 13, image 58). 3. 2 mm lesion in the posterosuperior right temporal lobe (series 13, image 70). 4. 2 mm lesion in the medial left occipital pole posterior to the stable curvilinear enhancement (series 13, image 73). 5. 5 mm enhancing lesion superiorly in the left occipital lobe with mild edema (series 13, image 86). 6. 2 mm faintly enhancing lesion in the left occipital lobe posterior and superior to the above lesion (series 13, image 92). 7. 2 mm faintly enhancing lesion in the left middle frontal gyrus (series 13, image 104). 8. 2 mm faintly enhancing lesion in the left frontal lobe anterior to the operculum (series 13, image 76 and series 15, image 37). Larger lesions: 1. Increased enhancement at the site of a previously punctate lesion in the medial right occipital lobe, with enhancement now being partly curvilinear and measuring 8 mm in length (series 13, image 73). 2. Increased enhancement at the site of a previously 3 mm lesion in the right temporal lobe which has a nodular appearance on axial images and a more curvilinear/platelike appearance on sagittal and coronal images with enhancement nail spanning 8 mm in total (series 13, image 62). Stable or smaller lesions: 1. 2.6 x 2.0 cm lesion in the left cerebellar hemisphere containing chronic blood products, unchanged in size and with unchanged surrounding T2 hyperintensity extending into the middle cerebellar peduncle (series 13, image 28). 2. 3 mm lesion posteriorly in the right occipital lobe, unchanged (series 13, image 79). 3. The punctate right parietal lesion which was new on the  prior study is no longer visualized. 4. 6 mm lesion in the right precentral gyrus, unchanged (series 13, image 115). 5. 4 mm focus of curvilinear enhancement in the medial left occipital lobe, unchanged (series 13, image 70). 6. 6 mm focus of curvilinear enhancement more  superiorly in the left occipital lobe, unchanged (series 13, image 87). 7. 9 mm lesion in the left precentral gyrus at the vertex, unchanged (series 13, image 133). Other brain findings: There is no evidence of an acute infarct, midline shift, or extra-axial fluid collection. There is mild cerebral atrophy. Patchy T2 hyperintensities in the cerebral white matter bilaterally are nonspecific but compatible with mild-to-moderate chronic small vessel ischemic disease, similar to the prior study. Vascular: Major intracranial vascular flow voids are preserved. Skull and upper cervical spine: No suspicious marrow lesion. Sinuses/Orbits: Unremarkable orbits. Mild mucosal thickening in the paranasal sinuses. Trace left mastoid fluid. Other: None. IMPRESSION: 1. Eight new enhancing supratentorial lesions as detailed above. The largest measures 5 mm in the superior left occipital lobe with mild edema. 2. Increased enhancement at the sites of pre-existing right occipital and right temporal lobe lesions, possibly post treatment changes given partially curvilinear nature. Attention on follow-up. 3. Stable or decreased size of all other lesions. Electronically Signed   By: Sebastian Ache M.D.   On: 06/01/2023 14:06     ASSESSMENT/PLAN:  This is a very pleasant 62 year old Caucasian male diagnosed with stage IV non-small cell lung cancer, adenocarcinoma.  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 He was diagnosed in July 2021. His molecular studies by [employer] show he has MSI high which is a good marker for response to immunotherapy which will be important for future  treatment.     The patient will complete SRS to the solidary brain metastasis under the care of Dr. [name] later today on 11/14/19.    He then underwent a course of weekly concurrent chemoradiation with carboplatin for an AUC of 2 and paclitaxel 45 mg per metered squared.  He tolerated this treatment well except for fatigue and mild odynophagia.   The patient showed evidence of disease progression with an increase in volume of the right infrahilar mass.  Since the patient has MSI high, the patient then underwent treatment with single agent immunotherapy with Keytruda 200 mg IV every 3 weeks.   The patient is status post 35 cycles and completed his 2 years of treatment on 03/23/22.  He also received 7 cycles of Avastin for vasogenic edema in the brain.  Avastin has been on hold unless needed again in the future.     He has SRS to an occipital brain lesion on 12/22/22 by Dr. [name]    He had evidence of progression on his surveillance imaging from July 2024. Therefore, he resumed immunotherapy with Keytruda. He is status post 9 cycles.    He underwent UHRT in the last day was on 03/03/2023 for some disease progression that was seen at that time. He is also was found to have new metastatic disease to the brain and had SRS on 03/07/23.    Unfortunately, the patient recently had continued CNS progression.  He is undergoing SRS under the care of Dr. [name] in the last day was earlier today on 06/21/2023.  The patient was seen with Dr. Arbutus Ped today.  In light of the progression in the brain, Dr. Arbutus Ped had a lengthly discussion with the patient today about her current condition and treatment options. The patient was given the option of a referral to hospice/palliative vs. Treatment with systemic chemotherapy with carboplatin for an AUC of 5, Alimta 500 mg/m, and Keytruda 200 mg IV every 3 weeks.  The patient is interested in proceeding  with systemic chemotherapy.  She is expected to start his first dose of this  treatment on __.  We discussed the adverse side effects of treatment including but not limited to alopecia, myelosuppression, nausea and vomiting, peripheral neuropathy, liver or renal dysfunction as well as immunotherapy mediated adverse effects.   We will arrange for the patient to have a B12 injection while in the clinic today.     I sent prescriptions for 1 mg folic acid p.o. daily as well as Compazine 10 mg every 6 hours as needed for nausea.   The patient will follow-up in 4 weeks for follow-up prior to starting cycle #2     No orders of the defined types were placed in this encounter.    I spent {CHL ONC TIME VISIT - ZOXWR:[id number]} counseling the patient face to face. The total time spent in the appointment was {CHL ONC TIME VISIT - BJYNW:[id number]}.  Gracianna Vink L Sydnei Ohaver, PA-C 06/16/23

## 2023-06-20 ENCOUNTER — Telehealth: Payer: Self-pay | Admitting: Internal Medicine

## 2023-06-20 ENCOUNTER — Encounter: Payer: Self-pay | Admitting: Internal Medicine

## 2023-06-20 NOTE — Telephone Encounter (Signed)
 Called the patient to confirm scheduled appointments. Patient is aware of the scheduled appointments and is active on MyChart.

## 2023-06-21 ENCOUNTER — Inpatient Hospital Stay (HOSPITAL_BASED_OUTPATIENT_CLINIC_OR_DEPARTMENT_OTHER): Payer: Managed Care, Other (non HMO) | Admitting: Physician Assistant

## 2023-06-21 ENCOUNTER — Ambulatory Visit
Admission: RE | Admit: 2023-06-21 | Discharge: 2023-06-21 | Disposition: A | Discharge status: Home / Self Care | Payer: Managed Care, Other (non HMO) | Source: Ambulatory Visit | Attending: Radiation Oncology | Admitting: Radiation Oncology

## 2023-06-21 ENCOUNTER — Other Ambulatory Visit: Payer: Self-pay

## 2023-06-21 ENCOUNTER — Inpatient Hospital Stay: Payer: Managed Care, Other (non HMO)

## 2023-06-21 ENCOUNTER — Inpatient Hospital Stay: Payer: Managed Care, Other (non HMO) | Admitting: Physician Assistant

## 2023-06-21 ENCOUNTER — Other Ambulatory Visit: Payer: Self-pay | Admitting: Internal Medicine

## 2023-06-21 ENCOUNTER — Telehealth: Payer: Self-pay | Admitting: Internal Medicine

## 2023-06-21 VITALS — BP 130/79 | HR 102 | Temp 98.0°F | Resp 13 | Wt 189.1 lb

## 2023-06-21 DIAGNOSIS — C7931 Secondary malignant neoplasm of brain: Secondary | ICD-10-CM | POA: Diagnosis not present

## 2023-06-21 DIAGNOSIS — C3491 Malignant neoplasm of unspecified part of right bronchus or lung: Secondary | ICD-10-CM | POA: Diagnosis not present

## 2023-06-21 LAB — RAD ONC ARIA SESSION SUMMARY
Course Elapsed Days: 0
Plan Fractions Treated to Date: 1
Plan Fractions Treated to Date: 1
Plan Prescribed Dose Per Fraction: 20 Gy
Plan Prescribed Dose Per Fraction: 20 Gy
Plan Total Fractions Prescribed: 1
Plan Total Fractions Prescribed: 1
Plan Total Prescribed Dose: 20 Gy
Plan Total Prescribed Dose: 20 Gy
Reference Point Dosage Given to Date: 20 Gy
Reference Point Dosage Given to Date: 20 Gy
Reference Point Session Dosage Given: 20 Gy
Reference Point Session Dosage Given: 20 Gy
Session Number: 1

## 2023-06-21 MED ORDER — PROCHLORPERAZINE MALEATE 10 MG PO TABS: 10.0000 mg | ORAL_TABLET | Freq: Four times a day (QID) | ORAL | 2 refills | Status: DC | PRN

## 2023-06-21 MED ORDER — FOLIC ACID 1 MG PO TABS: 1.0000 mg | ORAL_TABLET | Freq: Every day | ORAL | 2 refills | Status: DC

## 2023-06-21 MED ORDER — CYANOCOBALAMIN 1000 MCG/ML IJ SOLN
1000.0000 ug | Freq: Once | INTRAMUSCULAR | Status: AC
  Administered 2023-06-21: 1000 ug via INTRAMUSCULAR
  Filled 2023-06-21: qty 1

## 2023-06-21 NOTE — Progress Notes (Signed)
 Pharmacist Chemotherapy Monitoring - Initial Assessment    Anticipated start date: 06/28/23   The following has been reviewed per standard work regarding the patient's treatment regimen: The patient's diagnosis, treatment plan and drug doses, and organ/hematologic function Lab orders and baseline tests specific to treatment regimen  The treatment plan start date, drug sequencing, and pre-medications Prior authorization status  Patient's documented medication list, including drug-drug interaction screen and prescriptions for anti-emetics and supportive care specific to the treatment regimen The drug concentrations, fluid compatibility, administration routes, and timing of the medications to be used The patient's access for treatment and lifetime cumulative dose history, if applicable  The patient's medication allergies and previous infusion related reactions, if applicable   Changes made to treatment plan:  N/A  Follow up needed:  Pending authorization for treatment   Drusilla Kanner, PharmD, MBA

## 2023-06-21 NOTE — Progress Notes (Signed)
 [facility] OFFICE PROGRESS NOTE  [name], NP [address] [name] [address] [place] [address]  DIAGNOSIS: Stage IV (T3, N0, M1C) non-small cell lung cancer, adenocarcinoma.  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 He was diagnosed in July 2021.   Molecular Biomarkers:  MSI-High DETECTED Pembrolizumab Atezolizumab, Avelumab, Cemiplimab, Dostarlimab, Durvalumab, Ipilimumab, Nivolumab   STK11Splice Site SNV 1.9% Everolimus, Temsirolimus Yes   [id number] 1.7% Binimetinib Yes   [id number] 0.4%   Niraparib, Olaparib, Rucaparib, Talazoparib, Tazemetostat Yes  PRIOR THERAPY: 1) SRS to the solitary brain metastasis under the care of Dr. [name]. Last treatment 11/14/19. 2) Weekly concurrent chemoradiation with carboplatin for an AUC of 2, paclitaxel 45 mg/m2.  First dose expected on 11/25/2019. Status post 7 cycles, last dose was given 01/06/2020 with partial response.  3)  Immunotherapy with Keytruda 200 mg IV every 3 weeks.  First dose February 10, 2020 for a patient with MSI high.  Status post 35  cycles. 4) Avastin 15 mg/KG every 3 weeks.  First dose today for the vasogenic edema of the brain.S/P 7 cycles. 5) The patient had evidence for local disease recurrence in July 2024. 6) SRS to the occipital brain lesion under the care of Dr. [name] on 12/22/22 7)  UHRT under the care of Dr. [name], last dose expected on 03/03/23 8) SRS to the new metastatic brain lesion on 03/07/23 9) Resuming his treatment with Keytruda 200 Mg IV every 3 weeks. First dose 11/16/2022. Status post 10 cycles  10) SRS to the new metastatic brain lesions under the care of Dr. [name], completed on 06/21/2023.  CURRENT THERAPY: Palliative systemic chemotherapy with carboplatin for an AUC of 5, alimta 500 mg/m, and Keytruda. First dose on 06/28/23  INTERVAL HISTORY: Joseph Atkins 63 y.o. male returns to the  clinic today for a follow-up visit accompanied by his wife.  In summary, the patient is found to have evidence of disease progression in the central aspect of the right lower lobe concerning for local recurrence in the Summer of 2024.  Therefore, he was resumed on systemic treatment with immunotherapy with Keytruda.  He tolerates his Keytruda well.  Unfortunately, the patient has been having a lot of recurrent metastatic disease to the brain.  He saw Dr. Barbaraann Cao on 06/06/2023. Unfortunately, the patient had a repeat brain MRI that showed 8 novel metastases.  He underwent salvage SRS under the care of Dr. [name]. This was completed today on 06/21/2023.  Neuro-oncologist, Dr. Barbaraann Cao recommended, discussing systemic therapy options, such as the addition of chemotherapy, in light of ongoing progression in the CNS compartment.  Overall the patient is feeling fair today.  He denies any significant changes in his health.  He tolerated the SRS this morning well except he only has a mild headache from the mask being tight.  He is currently on Decadron 1 mg p.o. daily.  He will try and reduce this to 0.5 mg daily after radiation if tolerated per Dr. Liana Gerold instructions.  The patient wants to do what ever he can in order to live longer.  He is on board with adding chemotherapy if it may help prolong his life.   He continues to have a cough since undergoing radiation a few months ago although overall his cough is better than it was.  He has a prescription for Hycodan if needed although he has not been taking it.  He  does mention the phlegm is thick and he is wondering what else he is able to take over-the-counter.  He also mentions some weight gain from the Decadron which she thinks makes him get more winded with exertion.   Denies any associated fevers, night sweats, hemoptysis, or signs of infection. Denies any nausea, vomiting, or diarrhea.  The patient struggles with chronic constipation.  He has IBS constipation  subtype.  Today, he states that his constipation is "doing pretty good" and he has a "figured out I believe". Denies any new rashes or itching.  He recently had a restaging CT scan performed. He is here today for evaluation and to review his scan results.    MEDICAL HISTORY: Past Medical History:  Diagnosis Date   Cancer (HCC)    lung cancer   Diverticulosis    GERD (gastroesophageal reflux disease)    Hx of small bowel obstruction    Hyperlipidemia    Hypertension    Hypothyroidism    IBS (irritable bowel syndrome)    Substance abuse (HCC)    Alcoholic, Drug addition   Thyroid disease     ALLERGIES:  is allergic to penicillins.  MEDICATIONS:  Current Outpatient Medications  Medication Sig Dispense Refill   folic acid (FOLVITE) 1 MG tablet Take 1 tablet (1 mg total) by mouth daily. 30 tablet 2   prochlorperazine (COMPAZINE) 10 MG tablet Take 1 tablet (10 mg total) by mouth every 6 (six) hours as needed. 30 tablet 2   albuterol (PROVENTIL HFA;VENTOLIN HFA) 108 (90 Base) MCG/ACT inhaler Inhale 2 puffs into the lungs every 6 (six) hours as needed for wheezing or shortness of breath. 1 Inhaler 0   amLODipine (NORVASC) 5 MG tablet TAKE 1 TABLET (5 MG TOTAL) BY MOUTH DAILY. 90 tablet 1   ANORO ELLIPTA 62.5-25 MCG/INH AEPB 1 puff daily.     augmented betamethasone dipropionate (DIPROLENE-AF) 0.05 % cream Apply topically 2 (two) times daily.     azithromycin (ZITHROMAX Z-PAK) 250 MG tablet Take as directed 6 each 0   benzonatate (TESSALON) 100 MG capsule Take 1 capsule (100 mg total) by mouth 3 (three) times daily as needed. 30 capsule 2   [employer] ASPIRIN LOW DOSE 81 MG tablet TAKE 1 TABLET (81 MG TOTAL) BY MOUTH DAILY. SWALLOW WHOLE. 90 tablet 3   cyclobenzaprine (FLEXERIL) 10 MG tablet Take 10 mg by mouth 2 (two) times daily as needed for muscle spasms.     dexamethasone (DECADRON) 1 MG tablet Take 1 tablet (1 mg total) by mouth daily with breakfast. 60 tablet 1   docusate sodium (COLACE)  100 MG capsule Take 100 mg by mouth daily.     DULoxetine (CYMBALTA) 20 MG capsule Take 1 capsule (20 mg total) by mouth 2 (two) times daily. 60 capsule 5   fluticasone furoate-vilanterol (BREO ELLIPTA) 100-25 MCG/INH AEPB Inhale 1 puff into the lungs daily. 30 each 3   HYDROcodone bit-homatropine (HYCODAN) 5-1.5 MG/5ML syrup Take 5 mLs by mouth every 6 (six) hours as needed for cough. 473 mL 0   hydrOXYzine (ATARAX) 25 MG tablet Take 25 mg by mouth at bedtime.     levothyroxine (SYNTHROID) 100 MCG tablet TAKE 1 TABLET BY MOUTH EVERY DAY 90 tablet 1   lidocaine-prilocaine (EMLA) cream Apply 1 application topically as needed. 30 g 0   omeprazole (PRILOSEC) 40 MG capsule Take 1 capsule (40 mg total) by mouth daily. 90 capsule 4   ondansetron (ZOFRAN) 4 MG tablet Take 1 tablet (4 mg  total) by mouth every 8 (eight) hours as needed. 40 tablet 2   oxyCODONE-acetaminophen (PERCOCET/ROXICET) 5-325 MG tablet Take 1 tablet by mouth every 4 (four) hours as needed for severe pain. 30 tablet 0   pembrolizumab (KEYTRUDA) 100 MG/4ML SOLN See admin instructions.     polyethylene glycol powder (MIRALAX) powder Take 17 g by mouth daily. 255 g 11   temazepam (RESTORIL) 30 MG capsule Take 1 capsule (30 mg total) by mouth at bedtime as needed for sleep. 30 capsule 0   triamcinolone cream (KENALOG) 0.1 % APPLY TOPICALLY 2 TIMES DAILY AS NEEDED. 454 g 0   TRULANCE 3 MG TABS Take 1 tablet by mouth daily.     No current facility-administered medications for this visit.   Facility-Administered Medications Ordered in Other Visits  Medication Dose Route Frequency Provider Last Rate Last Admin   cyanocobalamin (VITAMIN B12) injection 1,000 mcg  1,000 mcg Intramuscular Once Yoshiharu Brassell L, PA-C        SURGICAL HISTORY:  Past Surgical History:  Procedure Laterality Date   arm surgery Right    BRONCHIAL BRUSHINGS  10/24/2019   Procedure: BRONCHIAL BRUSHINGS;  Surgeon: Leslye Peer, MD;  Location: [facility]  ENDOSCOPY;  Service: Cardiopulmonary;;  right lower lobe    BRONCHIAL BRUSHINGS  11/05/2019   Procedure: BRONCHIAL BRUSHINGS;  Surgeon: Leslye Peer, MD;  Location: [facility] ENDOSCOPY;  Service: Pulmonary;;   BRONCHIAL NEEDLE ASPIRATION BIOPSY  10/24/2019   Procedure: BRONCHIAL NEEDLE ASPIRATION BIOPSIES;  Surgeon: Leslye Peer, MD;  Location: [facility] ENDOSCOPY;  Service: Cardiopulmonary;;   BRONCHIAL NEEDLE ASPIRATION BIOPSY  11/05/2019   Procedure: BRONCHIAL NEEDLE ASPIRATION BIOPSIES;  Surgeon: Leslye Peer, MD;  Location: [facility] ENDOSCOPY;  Service: Pulmonary;;   ENDOBRONCHIAL ULTRASOUND N/A 10/24/2019   Procedure: ENDOBRONCHIAL ULTRASOUND;  Surgeon: Leslye Peer, MD;  Location: [facility] ENDOSCOPY;  Service: Cardiopulmonary;  Laterality: N/A;   FINGER SURGERY Right    Middle   IR IMAGING GUIDED PORT INSERTION  11/19/2019   VIDEO BRONCHOSCOPY N/A 10/24/2019   Procedure: VIDEO BRONCHOSCOPY WITHOUT FLUORO;  Surgeon: Leslye Peer, MD;  Location[facility] ENDOSCOPY;  Service: Cardiopulmonary;  Laterality: N/A;   VIDEO BRONCHOSCOPY WITH ENDOBRONCHIAL NAVIGATION N/A 11/05/2019   Procedure: VIDEO BRONCHOSCOPY WITH ENDOBRONCHIAL NAVIGATION;  Surgeon: Leslye Peer, MD;  Location: [facility] ENDOSCOPY;  Service: Pulmonary;  Laterality: N/A;    REVIEW OF SYSTEMS:   Constitutional: Positive for stable fatigue. Negative for chills and fever.  HENT: Negative for mouth sores, nosebleeds, sore throat and trouble swallowing.   Eyes:  Negative for eye problems and icterus.  Respiratory: Stable shortness of breath with exertion and intermittent cough. Negative for hemoptysis and wheezing.   Cardiovascular: Negative for chest pain. Gastrointestinal: Positive for chronic/frequent constipation (improved at this time). Negative for diarrhea, nausea and vomiting.  Genitourinary: Negative for bladder incontinence, difficulty urinating, dysuria, frequency and hematuria.   Musculoskeletal: Negative for back pain, gait problem, neck pain  and neck stiffness.  Skin: Positive for occasional rashes and itching (none at this time).  Neurological: Positive for stable balance changes and gait disturbances.  Mild headache. Negative for extremity weakness,  light-headedness and seizures.  Hematological: Negative for adenopathy. Does not bruise/bleed easily.  Psychiatric/Behavioral: Negative for confusion, depression and sleep disturbance. The patient is not nervous/anxious.     PHYSICAL EXAMINATION:  Blood pressure 130/79, pulse (!) 102, temperature 98 F (36.7 C), temperature source Temporal, resp. rate 13, weight 189 lb 1.6 oz (85.8 kg), SpO2 100%.  ECOG PERFORMANCE  STATUS: 1  Physical Exam  Constitutional: Oriented to person, place, and time and well-developed, well-nourished, and in no distress. Marland Kitchen  HENT:  Head: Normocephalic and atraumatic.  Mouth/Throat: Oropharynx is clear and moist. No oropharyngeal exudate.  Eyes: Conjunctivae are normal. Right eye exhibits no discharge. Left eye exhibits no discharge. No scleral icterus.  Neck: Normal range of motion. Neck supple.  Cardiovascular: Normal rate, regular rhythm, normal heart sounds and intact distal pulses.   Pulmonary/Chest: Effort normal. Quiet breath sounds bilaterally. No respiratory distress.  No rales.  Abdominal: Soft. Bowel sounds are normal. Exhibits no distension and no mass. There is no tenderness.  Musculoskeletal: Normal range of motion. Exhibits no edema.  Lymphadenopathy:    No cervical adenopathy.  Neurological: Alert and oriented to person, place, and time. Exhibits normal muscle tone. Gait normal. Coordination normal.  Skin: Skin is warm and dry. No rash noted. Not diaphoretic. No erythema. No pallor.  Psychiatric: Mood, memory and judgment normal.  Vitals reviewed.  LABORATORY DATA: Lab Results  Component Value Date   WBC 11.1 (H) 05/25/2023   HGB 11.9 (L) 05/25/2023   HCT 37.2 (L) 05/25/2023   MCV 91.4 05/25/2023   PLT 248 05/25/2023       Chemistry      Component Value Date/Time   NA 133 (L) 05/25/2023 0847   NA 141 03/08/2017 1707   K 4.1 05/25/2023 0847   CL 100 05/25/2023 0847   CO2 26 05/25/2023 0847   BUN 8 05/25/2023 0847   BUN 12 03/08/2017 1707   CREATININE 1.01 05/25/2023 0847      Component Value Date/Time   CALCIUM 8.4 (L) 05/25/2023 0847   ALKPHOS 75 05/25/2023 0847   AST 18 05/25/2023 0847   ALT 14 05/25/2023 0847   BILITOT 0.6 05/25/2023 0847       RADIOGRAPHIC STUDIES:  CT CHEST ABDOMEN PELVIS W CONTRAST Result Date: 06/19/2023 CLINICAL DATA:  Stage IV right lung adenocarcinoma with brain metastases, ongoing immunotherapy. Restaging. Dyspnea on exertion and cough. * Tracking Code: BO * EXAM: CT CHEST, ABDOMEN, AND PELVIS WITH CONTRAST TECHNIQUE: Multidetector CT imaging of the chest, abdomen and pelvis was performed following the standard protocol during bolus administration of intravenous contrast. RADIATION DOSE REDUCTION: This exam was performed according to the departmental dose-optimization program which includes automated exposure control, adjustment of the mA and/or kV according to patient size and/or use of iterative reconstruction technique. CONTRAST:  OMNIPAQUE IOHEXOL 300 MG/ML  SOLN COMPARISON:  04/05/2023 CT chest, abdomen and pelvis. FINDINGS: CT CHEST FINDINGS Cardiovascular: Normal heart size. Small pericardial effusion, stable. Left anterior descending and right coronary atherosclerosis. Right internal jugular Port-A-Cath terminates at the cavoatrial junction. Atherosclerotic nonaneurysmal thoracic aorta. Normal caliber pulmonary arteries. No central pulmonary emboli. Mediastinum/Nodes: No significant thyroid nodules. Unremarkable esophagus. No pathologically enlarged axillary, mediastinal or hilar lymph nodes. Lungs/Pleura: No pneumothorax. Trace posterior right pleural effusion is unchanged. No left pleural effusion. Moderate centrilobular emphysema with diffuse bronchial wall  thickening. Infrahilar medial right lower lobe 4.2 x 3.7 cm lung mass (series 2/image 46), previously 4.2 x 3.8 cm, not substantially changed. Extensive dense geographic patchy consolidation throughout the right lower lobe and right middle lobe with lesser involvement of the basilar right upper lobe, significantly increased with associated increased volume loss, favoring evolving postradiation change. Indistinct sub solid 1.2 cm basilar right upper lobe pulmonary nodule with ground-glass halo (series 5/image 97), new, nonspecific, potentially also part of the evolving postradiation change. Indistinct 1.9 cm medial left  lower lobe pulmonary nodule (series 5/image 128), new, potentially inflammatory. Previously described solid 0.5 cm right lower lobe pulmonary nodule is obscured by the new consolidation. No additional new significant pulmonary nodules. Musculoskeletal: No aggressive appearing focal osseous lesions. Minimal thoracic spondylosis. CT ABDOMEN PELVIS FINDINGS Hepatobiliary: Normal liver with no liver mass. Normal gallbladder with no radiopaque cholelithiasis. No biliary ductal dilatation. Pancreas: Normal, with no mass or duct dilation. Spleen: Mild splenomegaly. Craniocaudal splenic length 14.1 cm, unchanged. No splenic masses. Adrenals/Urinary Tract: Normal adrenals. Subcentimeter hypodense medial lower left renal cortical lesion is too small to characterize and not appreciably changed, considered benign. No new renal masses. No hydronephrosis. Normal bladder. Stomach/Bowel: Normal non-distended stomach. Normal caliber small bowel with no small bowel wall thickening. Normal appendix. Normal large bowel with no diverticulosis, large bowel wall thickening or pericolonic fat stranding. Vascular/Lymphatic: Atherosclerotic nonaneurysmal abdominal aorta. Patent portal, splenic, hepatic and renal veins. Retroaortic left renal vein. No pathologically enlarged lymph nodes in the abdomen or pelvis. Reproductive:  Normal size prostate. Other: No pneumoperitoneum, ascites or focal fluid collection. Musculoskeletal: No aggressive appearing focal osseous lesions. Mild lumbar spondylosis. IMPRESSION: 1. Stable infrahilar medial right lower lobe lung mass. No findings highly suspicious for new or progressive metastatic disease in the chest, abdomen or pelvis. 2. Significantly increased extensive dense geographic patchy consolidation throughout the right lower lobe and right middle lobe with lesser involvement of the basilar right upper lobe, with associated increased volume loss, favoring evolving postradiation change. 3. New nonspecific indistinct 1.2 cm basilar right upper lobe and 1.9 cm medial left lower lobe pulmonary nodules, favor inflammatory nodules such as due to the suspected evolving postradiation change. Suggest attention on follow-up chest CT in 3 months. 4. Stable trace posterior right pleural effusion. Stable small pericardial effusion. 5. Two-vessel coronary atherosclerosis. 6. Stable mild splenomegaly. 7. Aortic Atherosclerosis (ICD10-I70.0) and Emphysema (ICD10-J43.9). Electronically Signed   By: Delbert Phenix M.D.   On: 06/19/2023 16:34   MR BRAIN W WO CONTRAST Result Date: 06/01/2023 CLINICAL DATA:  Brain/CNS neoplasm, assess treatment response. Metastatic lung cancer treated with SRS. EXAM: MRI HEAD WITHOUT AND WITH CONTRAST TECHNIQUE: Multiplanar, multiecho pulse sequences of the brain and surrounding structures were obtained without and with intravenous contrast. CONTRAST:  8 mL [employer] Contrast was administered via a port which was accessed by a Designer, jewellery. COMPARISON:  Head MRI 02/22/2023 FINDINGS: Brain: New lesions: 1. 2 mm lesion in the inferior cerebellar vermis (series 13, image 31). 2. 1-2 mm lesion in the inferior right occipital lobe (series 13, image 58). 3. 2 mm lesion in the posterosuperior right temporal lobe (series 13, image 70). 4. 2 mm lesion in the medial left occipital pole  posterior to the stable curvilinear enhancement (series 13, image 73). 5. 5 mm enhancing lesion superiorly in the left occipital lobe with mild edema (series 13, image 86). 6. 2 mm faintly enhancing lesion in the left occipital lobe posterior and superior to the above lesion (series 13, image 92). 7. 2 mm faintly enhancing lesion in the left middle frontal gyrus (series 13, image 104). 8. 2 mm faintly enhancing lesion in the left frontal lobe anterior to the operculum (series 13, image 76 and series 15, image 37). Larger lesions: 1. Increased enhancement at the site of a previously punctate lesion in the medial right occipital lobe, with enhancement now being partly curvilinear and measuring 8 mm in length (series 13, image 73). 2. Increased enhancement at the site of a previously 3 mm lesion in  the right temporal lobe which has a nodular appearance on axial images and a more curvilinear/platelike appearance on sagittal and coronal images with enhancement nail spanning 8 mm in total (series 13, image 62). Stable or smaller lesions: 1. 2.6 x 2.0 cm lesion in the left cerebellar hemisphere containing chronic blood products, unchanged in size and with unchanged surrounding T2 hyperintensity extending into the middle cerebellar peduncle (series 13, image 28). 2. 3 mm lesion posteriorly in the right occipital lobe, unchanged (series 13, image 79). 3. The punctate right parietal lesion which was new on the prior study is no longer visualized. 4. 6 mm lesion in the right precentral gyrus, unchanged (series 13, image 115). 5. 4 mm focus of curvilinear enhancement in the medial left occipital lobe, unchanged (series 13, image 70). 6. 6 mm focus of curvilinear enhancement more superiorly in the left occipital lobe, unchanged (series 13, image 87). 7. 9 mm lesion in the left precentral gyrus at the vertex, unchanged (series 13, image 133). Other brain findings: There is no evidence of an acute infarct, midline shift, or  extra-axial fluid collection. There is mild cerebral atrophy. Patchy T2 hyperintensities in the cerebral white matter bilaterally are nonspecific but compatible with mild-to-moderate chronic small vessel ischemic disease, similar to the prior study. Vascular: Major intracranial vascular flow voids are preserved. Skull and upper cervical spine: No suspicious marrow lesion. Sinuses/Orbits: Unremarkable orbits. Mild mucosal thickening in the paranasal sinuses. Trace left mastoid fluid. Other: None. IMPRESSION: 1. Eight new enhancing supratentorial lesions as detailed above. The largest measures 5 mm in the superior left occipital lobe with mild edema. 2. Increased enhancement at the sites of pre-existing right occipital and right temporal lobe lesions, possibly post treatment changes given partially curvilinear nature. Attention on follow-up. 3. Stable or decreased size of all other lesions. Electronically Signed   By: Sebastian Ache M.D.   On: 06/01/2023 14:06     ASSESSMENT/PLAN:  This is a very pleasant 63 year old Caucasian male diagnosed with stage IV non-small cell lung cancer, adenocarcinoma.  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 He was diagnosed in July 2021. His molecular studies by [employer] show he has MSI high which is a good marker for response to immunotherapy which will be important for future treatment.     The patient will complete SRS to the solidary brain metastasis under the care of Dr. [name] later today on 11/14/19.    He then underwent a course of weekly concurrent chemoradiation with carboplatin for an AUC of 2 and paclitaxel 45 mg per metered squared.  He tolerated this treatment well except for fatigue and mild odynophagia.   The patient showed evidence of disease progression with an increase in volume of the right infrahilar mass.  Since the patient has MSI high, the patient then underwent  treatment with single agent immunotherapy with Keytruda 200 mg IV every 3 weeks.   The patient is status post 35 cycles and completed his 2 years of treatment on 03/23/22.  He also received 7 cycles of Avastin for vasogenic edema in the brain.  Avastin has been on hold unless needed again in the future.     He has SRS to an occipital brain lesion on 12/22/22 by Dr. [name]    He had evidence of progression on his surveillance imaging from July 2024. Therefore, he resumed immunotherapy with Keytruda. He is status post 9 cycles.  He underwent UHRT in the last day was on 03/03/2023 for some disease progression that was seen at that time. He is also was found to have new metastatic disease to the brain and had SRS on 03/07/23.    Unfortunately, the patient recently had continued CNS progression.  He is undergoing SRS under the care of Dr. [name] in the last day was earlier today on 06/21/2023.  The patient was seen with Dr. Arbutus Ped today.  In light of the progression in the brain, Dr. Arbutus Ped and the patient are in agreement to try to add on systemic chemotherapy with carboplatin for an AUC of 5, Alimta 500 mg/m, and Keytruda 200 mg IV every 3 weeks to see if it helps with his progression in the brain.  The patient had a repeat CT CAP performed recently which continues to show stable disease. There are some radiation changes to the right lung for which he uses hycodan for cough. The patient is interested in proceeding with systemic chemotherapy.  He is expected to start his first dose of this treatment on 06/28/23.  We discussed the adverse side effects of treatment including but not limited to alopecia, myelosuppression, nausea and vomiting, peripheral neuropathy, liver or renal dysfunction as well as immunotherapy mediated adverse effects.   We will arrange for the patient to have a B12 injection while in the clinic today.     I sent prescriptions for 1 mg folic acid p.o. daily as well as Compazine 10 mg  every 6 hours as needed for nausea.   The patient will follow-up in 4 weeks for follow-up prior to starting cycle #2.   Will continue to follow with neuro-oncology and radiation oncology.  He is currently taking Decadron.   The patient was advised to call immediately if he has any concerning symptoms in the interval. The patient voices understanding of current disease status and treatment options and is in agreement with the current care plan. All questions were answered. The patient knows to call the clinic with any problems, questions or concerns. We can certainly see the patient much sooner if necessary      Orders Placed This Encounter  Procedures   CBC with Differential (Cancer Center Only)    Standing Status:   Standing    Number of Occurrences:   12    Expiration Date:   06/20/2024   CMP (Cancer Center only)    Standing Status:   Standing    Number of Occurrences:   12    Expiration Date:   06/20/2024      Johnette Abraham Saleen Peden, PA-C 06/21/23  ADDENDUM: Hematology/Oncology Attending: I had a face-to-face encounter with the patient today.  I reviewed his record, lab, scan and recommended his care plan.  This is a very pleasant 63 years old white male with history of stage IV non-small cell lung cancer, adenocarcinoma diagnosed in July 2021 with molecular studies showed detected MSI high.  The patient was treated in the past with SRS to multiple brain metastasis.  He was also treated with initial course of immunotherapy with [facility] for 2 years followed by observation.  He continues to have evidence for disease progression but no systemic disease progression.  Radiation oncology has been running out of options and requested consideration of additional systemic chemotherapy to his current treatment with immunotherapy with the hope to control his disease in the brain. He had repeat CT scan of the chest, abdomen and pelvis performed recently.  I personally and independently  reviewed the scans and discussed the result with the patient and his wife.  He has no evidence for systemic disease progression. I discussed with the patient adding systemic chemotherapy to his current treatment with Keytruda every 3 weeks.  The patient is interested in any options that can extend his life. I recommended for him changing the treatment to carboplatin for AUC of 5, Alimta 500 Mg/M2 and Keytruda 200 Mg IV every 3 weeks starting next week.  He will receive vitamin B12 injection and we will give him prescription for folic acid. The patient will come back for follow-up visit in 4 weeks for evaluation before starting cycle #2. He was advised to call immediately if he has any other concerning symptoms in the interval. The total time spent in the appointment was 30 minutes. Disclaimer: This note was dictated with voice recognition software. Similar sounding words can inadvertently be transcribed and may be missed upon review. Lajuana Matte, MD

## 2023-06-21 NOTE — Progress Notes (Signed)
  Name: Joseph Atkins  MRN: [id number]  Date: 06/21/2023   DOB: 19-Apr-1961  Stereotactic Radiosurgery Operative Note  PRE-OPERATIVE DIAGNOSIS:  Multiple Brain Metastases  POST-OPERATIVE DIAGNOSIS:  Multiple Brain Metastases  PROCEDURE:  Stereotactic Radiosurgery  SURGEON:  Stefani Dama, MD  NARRATIVE: The patient underwent a radiation treatment planning session in the radiation oncology simulation suite under the care of the radiation oncology physician and physicist.  I participated closely in the radiation treatment planning afterwards. The patient underwent planning CT which was fused to 3T high resolution MRI with 1 mm axial slices.  These images were fused on the planning system.  We contoured the gross target volumes and subsequently expanded this to yield the Planning Target Volume. I actively participated in the planning process.  I helped to define and review the target contours and also the contours of the optic pathway, eyes, brainstem and selected nearby organs at risk.  All the dose constraints for critical structures were reviewed and compared to AAPM Task Group 101.  The prescription dose conformity was reviewed.  I approved the plan electronically.    Accordingly, [name] was brought to the TrueBeam stereotactic radiation treatment linac and placed in the custom immobilization mask.  The patient was aligned according to the IR fiducial markers with BrainLab Exactrac, then orthogonal x-rays were used in ExacTrac with the 6DOF robotic table and the shifts were made to align the patient  [name] received stereotactic radiosurgery uneventfully.    Lesions treated:  8   Complex lesions treated:  0 (>3.5 cm, <54mm of optic path, or within the brainstem)   The detailed description of the procedure is recorded in the radiation oncology procedure note.  I was present for the duration of the procedure.  DISPOSITION:  Following delivery, the patient was transported  to nursing in stable condition and monitored for possible acute effects to be discharged to home in stable condition with follow-up in one month.  Stefani Dama, MD 06/21/2023 8:34 AM

## 2023-06-21 NOTE — Telephone Encounter (Signed)
 Called the patient to make him aware of the upcoming appointments. Patient is active on MyChart.

## 2023-06-21 NOTE — Patient Instructions (Signed)

## 2023-06-22 NOTE — Radiation Completion Notes (Addendum)
  Radiation Oncology         [phone] ________________________________  Name: Joseph Atkins MRN: [id number]  Date of Service: 06/21/2023  DOB: January 12, 1961  End of Treatment Note     Diagnosis: Stage IV, cT3, N0, M1c, non-small cell lung cancer, adenocarcinoma of the right lower lobe with locally recurrent disease   Intent: Palliative     ==========DELIVERED PLANS==========  06/21/23 SRS Treatment   Plan Name: Brain_SRS_2 Site: Brain PTV_18_OccipLt_51mm PTV_21_OccipLt_20mm PTV_22_OccipPostLt_39mm PTV_23_OccipLt_53mm PTV_24_Vermis_11mm PTV_25_TempRt_40mm Technique: SBRT/SRT-IMRT Mode: Photon Dose Per Fraction: 20 Gy Prescribed Dose (Delivered / Prescribed): 20 Gy / 20 Gy Prescribed Fxs (Delivered / Prescribed): 1 / 1   Plan Name: Brain_SRS_1 Site: Brain PTV_19_FrontLt_69mm PTV_20_FrontLeft_18mm Technique: SBRT/SRT-3D Mode: Photon Dose Per Fraction: 20 Gy Prescribed Dose (Delivered / Prescribed): 20 Gy / 20 Gy Prescribed Fxs (Delivered / Prescribed): 1 / 1     ==========ON TREATMENT VISIT DATES========== 2023-06-21, 2023-06-21   See weekly On Treatment Notes in Epic for details in the Media tab (listed as Progress notes on the On Treatment Visit Dates listed above).The patient tolerated radiation.    The patient will receive a call in about one month from the radiation oncology department. He will continue follow up with Dr. Arbutus Ped and Dr. Barbaraann Cao as well.      Osker Mason, PAC

## 2023-06-27 ENCOUNTER — Other Ambulatory Visit: Payer: Self-pay

## 2023-06-27 MED FILL — Fosaprepitant Dimeglumine For IV Infusion 150 MG (Base Eq): INTRAVENOUS | Qty: 5 | Status: AC

## 2023-06-28 ENCOUNTER — Inpatient Hospital Stay: Payer: Managed Care, Other (non HMO)

## 2023-06-28 ENCOUNTER — Encounter: Payer: Self-pay | Admitting: Physician Assistant

## 2023-06-28 ENCOUNTER — Inpatient Hospital Stay: Payer: Managed Care, Other (non HMO) | Attending: Internal Medicine

## 2023-06-28 ENCOUNTER — Encounter: Payer: Self-pay | Admitting: Internal Medicine

## 2023-06-28 VITALS — BP 137/88 | HR 91 | Temp 98.0°F | Resp 16 | Wt 191.2 lb

## 2023-06-28 DIAGNOSIS — Z79620 Long term (current) use of immunosuppressive biologic: Secondary | ICD-10-CM | POA: Diagnosis not present

## 2023-06-28 DIAGNOSIS — C7931 Secondary malignant neoplasm of brain: Secondary | ICD-10-CM | POA: Diagnosis present

## 2023-06-28 DIAGNOSIS — C3491 Malignant neoplasm of unspecified part of right bronchus or lung: Secondary | ICD-10-CM | POA: Insufficient documentation

## 2023-06-28 DIAGNOSIS — Z5112 Encounter for antineoplastic immunotherapy: Secondary | ICD-10-CM | POA: Diagnosis not present

## 2023-06-28 DIAGNOSIS — Z95828 Presence of other vascular implants and grafts: Secondary | ICD-10-CM

## 2023-06-28 DIAGNOSIS — Z5111 Encounter for antineoplastic chemotherapy: Secondary | ICD-10-CM | POA: Diagnosis not present

## 2023-06-28 LAB — CMP (CANCER CENTER ONLY)
ALT: 14 U/L (ref 0–44)
AST: 16 U/L (ref 15–41)
Albumin: 3.7 g/dL (ref 3.5–5.0)
Alkaline Phosphatase: 69 U/L (ref 38–126)
Anion gap: 6 (ref 5–15)
BUN: 10 mg/dL (ref 8–23)
CO2: 25 mmol/L (ref 22–32)
Calcium: 8.3 mg/dL — ABNORMAL LOW (ref 8.9–10.3)
Chloride: 103 mmol/L (ref 98–111)
Creatinine: 0.84 mg/dL (ref 0.61–1.24)
GFR, Estimated: >60 mL/min (ref >60)
Glucose, Bld: 105 mg/dL — ABNORMAL HIGH (ref 70–99)
Potassium: 4.2 mmol/L (ref 3.5–5.1)
Sodium: 134 mmol/L — ABNORMAL LOW (ref 135–145)
Total Bilirubin: 0.5 mg/dL (ref 0.0–1.2)
Total Protein: 6.5 g/dL (ref 6.5–8.1)

## 2023-06-28 LAB — TSH: TSH: 1.749 u[IU]/mL (ref 0.350–4.500)

## 2023-06-28 LAB — CBC WITH DIFFERENTIAL (CANCER CENTER ONLY)
Abs Immature Granulocytes: 0.07 10*3/uL (ref 0.00–0.07)
Basophils Absolute: 0 10*3/uL (ref 0.0–0.1)
Basophils Relative: 0 %
Eosinophils Absolute: 0.1 10*3/uL (ref 0.0–0.5)
Eosinophils Relative: 1 %
HCT: 35.5 % — ABNORMAL LOW (ref 39.0–52.0)
Hemoglobin: 11.5 g/dL — ABNORMAL LOW (ref 13.0–17.0)
Immature Granulocytes: 1 %
Lymphocytes Relative: 7 %
Lymphs Abs: 0.6 10*3/uL — ABNORMAL LOW (ref 0.7–4.0)
MCH: 29.9 pg (ref 26.0–34.0)
MCHC: 32.4 g/dL (ref 30.0–36.0)
MCV: 92.4 fL (ref 80.0–100.0)
Monocytes Absolute: 0.6 10*3/uL (ref 0.1–1.0)
Monocytes Relative: 7 %
Neutro Abs: 8 10*3/uL — ABNORMAL HIGH (ref 1.7–7.7)
Neutrophils Relative %: 84 %
Platelet Count: 259 10*3/uL (ref 150–400)
RBC: 3.84 MIL/uL — ABNORMAL LOW (ref 4.22–5.81)
RDW: 14.7 % (ref 11.5–15.5)
WBC Count: 9.4 10*3/uL (ref 4.0–10.5)
nRBC: 0 % (ref 0.0–0.2)

## 2023-06-28 MED ORDER — CARBOPLATIN CHEMO INJECTION 600 MG/60ML
595.5000 mg | Freq: Once | INTRAVENOUS | Status: AC
  Administered 2023-06-28: 600 mg via INTRAVENOUS
  Filled 2023-06-28: qty 60

## 2023-06-28 MED ORDER — SODIUM CHLORIDE 0.9 % IV SOLN
200.0000 mg | Freq: Once | INTRAVENOUS | Status: AC
  Administered 2023-06-28: 200 mg via INTRAVENOUS
  Filled 2023-06-28: qty 200

## 2023-06-28 MED ORDER — FOSAPREPITANT DIMEGLUMINE INJECTION 150 MG
150.0000 mg | Freq: Once | INTRAVENOUS | Status: AC
  Administered 2023-06-28: 150 mg via INTRAVENOUS
  Filled 2023-06-28: qty 150

## 2023-06-28 MED ORDER — PALONOSETRON HCL INJECTION 0.25 MG/5ML
0.2500 mg | Freq: Once | INTRAVENOUS | Status: AC
  Administered 2023-06-28: 0.25 mg via INTRAVENOUS
  Filled 2023-06-28: qty 5

## 2023-06-28 MED ORDER — SODIUM CHLORIDE 0.9% FLUSH
10.0000 mL | INTRAVENOUS | Status: DC | PRN
  Administered 2023-06-28: 10 mL

## 2023-06-28 MED ORDER — SODIUM CHLORIDE 0.9 % IV SOLN
500.0000 mg/m2 | Freq: Once | INTRAVENOUS | Status: AC
  Administered 2023-06-28: 1000 mg via INTRAVENOUS
  Filled 2023-06-28: qty 40

## 2023-06-28 MED ORDER — DEXAMETHASONE SODIUM PHOSPHATE 10 MG/ML IJ SOLN
10.0000 mg | Freq: Once | INTRAMUSCULAR | Status: AC
  Administered 2023-06-28: 10 mg via INTRAVENOUS
  Filled 2023-06-28: qty 1

## 2023-06-28 MED ORDER — SODIUM CHLORIDE 0.9 % IV SOLN: INTRAVENOUS | Status: DC

## 2023-06-28 NOTE — Progress Notes (Signed)
 Keep Carboplatin dose at 600 mg per Dr. Arbutus Ped.  Ebony Hail, Pharm.D., CPP 06/28/2023@1 :42 PM

## 2023-06-29 LAB — T4: T4, Total: 8.2 ug/dL (ref 4.5–12.0)

## 2023-06-30 ENCOUNTER — Telehealth: Payer: Self-pay | Admitting: Medical Oncology

## 2023-06-30 ENCOUNTER — Other Ambulatory Visit: Payer: Self-pay | Admitting: Physician Assistant

## 2023-06-30 DIAGNOSIS — C3491 Malignant neoplasm of unspecified part of right bronchus or lung: Secondary | ICD-10-CM

## 2023-06-30 NOTE — Telephone Encounter (Signed)
 Joseph Atkins said pt can receive his chemo/Keytruda at cancer center x 4 cycles.

## 2023-07-05 ENCOUNTER — Inpatient Hospital Stay: Payer: Managed Care, Other (non HMO)

## 2023-07-05 ENCOUNTER — Encounter: Payer: Self-pay | Admitting: Internal Medicine

## 2023-07-05 ENCOUNTER — Other Ambulatory Visit: Payer: Self-pay

## 2023-07-05 DIAGNOSIS — C7931 Secondary malignant neoplasm of brain: Secondary | ICD-10-CM | POA: Diagnosis not present

## 2023-07-05 DIAGNOSIS — Z95828 Presence of other vascular implants and grafts: Secondary | ICD-10-CM

## 2023-07-05 DIAGNOSIS — C3491 Malignant neoplasm of unspecified part of right bronchus or lung: Secondary | ICD-10-CM

## 2023-07-05 LAB — CBC WITH DIFFERENTIAL (CANCER CENTER ONLY)
Abs Immature Granulocytes: 0.04 10*3/uL (ref 0.00–0.07)
Basophils Absolute: 0 10*3/uL (ref 0.0–0.1)
Basophils Relative: 0 %
Eosinophils Absolute: 0 10*3/uL (ref 0.0–0.5)
Eosinophils Relative: 0 %
HCT: 33.7 % — ABNORMAL LOW (ref 39.0–52.0)
Hemoglobin: 11.2 g/dL — ABNORMAL LOW (ref 13.0–17.0)
Immature Granulocytes: 2 %
Lymphocytes Relative: 14 %
Lymphs Abs: 0.3 10*3/uL — ABNORMAL LOW (ref 0.7–4.0)
MCH: 28.9 pg (ref 26.0–34.0)
MCHC: 33.2 g/dL (ref 30.0–36.0)
MCV: 87.1 fL (ref 80.0–100.0)
Monocytes Absolute: 0.1 10*3/uL (ref 0.1–1.0)
Monocytes Relative: 5 %
Neutro Abs: 1.9 10*3/uL (ref 1.7–7.7)
Neutrophils Relative %: 79 %
Platelet Count: 162 10*3/uL (ref 150–400)
RBC: 3.87 MIL/uL — ABNORMAL LOW (ref 4.22–5.81)
RDW: 14.1 % (ref 11.5–15.5)
WBC Count: 2.4 10*3/uL — ABNORMAL LOW (ref 4.0–10.5)
nRBC: 0 % (ref 0.0–0.2)

## 2023-07-05 LAB — CMP (CANCER CENTER ONLY)
ALT: 17 U/L (ref 0–44)
AST: 26 U/L (ref 15–41)
Albumin: 3.9 g/dL (ref 3.5–5.0)
Alkaline Phosphatase: 60 U/L (ref 38–126)
Anion gap: 7 (ref 5–15)
BUN: 9 mg/dL (ref 8–23)
CO2: 25 mmol/L (ref 22–32)
Calcium: 8.2 mg/dL — ABNORMAL LOW (ref 8.9–10.3)
Chloride: 99 mmol/L (ref 98–111)
Creatinine: 0.88 mg/dL (ref 0.61–1.24)
GFR, Estimated: >60 mL/min (ref >60)
Glucose, Bld: 110 mg/dL — ABNORMAL HIGH (ref 70–99)
Potassium: 4 mmol/L (ref 3.5–5.1)
Sodium: 131 mmol/L — ABNORMAL LOW (ref 135–145)
Total Bilirubin: 0.5 mg/dL (ref 0.0–1.2)
Total Protein: 6.7 g/dL (ref 6.5–8.1)

## 2023-07-05 MED ORDER — SODIUM CHLORIDE 0.9% FLUSH
10.0000 mL | INTRAVENOUS | Status: DC | PRN
  Administered 2023-07-05: 10 mL

## 2023-07-05 MED ORDER — HEPARIN SOD (PORK) LOCK FLUSH 100 UNIT/ML IV SOLN
500.0000 [IU] | Freq: Once | INTRAVENOUS | Status: AC | PRN
  Administered 2023-07-05: 500 [IU]

## 2023-07-06 ENCOUNTER — Other Ambulatory Visit: Payer: Managed Care, Other (non HMO)

## 2023-07-06 ENCOUNTER — Ambulatory Visit: Payer: Managed Care, Other (non HMO)

## 2023-07-06 ENCOUNTER — Ambulatory Visit: Payer: Managed Care, Other (non HMO) | Admitting: Physician Assistant

## 2023-07-12 ENCOUNTER — Inpatient Hospital Stay: Payer: Managed Care, Other (non HMO)

## 2023-07-12 DIAGNOSIS — Z95828 Presence of other vascular implants and grafts: Secondary | ICD-10-CM

## 2023-07-12 DIAGNOSIS — C3491 Malignant neoplasm of unspecified part of right bronchus or lung: Secondary | ICD-10-CM

## 2023-07-12 DIAGNOSIS — C7931 Secondary malignant neoplasm of brain: Secondary | ICD-10-CM | POA: Diagnosis not present

## 2023-07-12 LAB — CMP (CANCER CENTER ONLY)
ALT: 20 U/L (ref 0–44)
AST: 19 U/L (ref 15–41)
Albumin: 3.6 g/dL (ref 3.5–5.0)
Alkaline Phosphatase: 54 U/L (ref 38–126)
Anion gap: 6 (ref 5–15)
BUN: 9 mg/dL (ref 8–23)
CO2: 25 mmol/L (ref 22–32)
Calcium: 8.4 mg/dL — ABNORMAL LOW (ref 8.9–10.3)
Chloride: 102 mmol/L (ref 98–111)
Creatinine: 0.82 mg/dL (ref 0.61–1.24)
GFR, Estimated: >60 mL/min (ref >60)
Glucose, Bld: 102 mg/dL — ABNORMAL HIGH (ref 70–99)
Potassium: 3.9 mmol/L (ref 3.5–5.1)
Sodium: 133 mmol/L — ABNORMAL LOW (ref 135–145)
Total Bilirubin: 0.6 mg/dL (ref 0.0–1.2)
Total Protein: 6.3 g/dL — ABNORMAL LOW (ref 6.5–8.1)

## 2023-07-12 LAB — CBC WITH DIFFERENTIAL (CANCER CENTER ONLY)
Abs Immature Granulocytes: 0.03 10*3/uL (ref 0.00–0.07)
Basophils Absolute: 0 10*3/uL (ref 0.0–0.1)
Basophils Relative: 0 %
Eosinophils Absolute: 0 10*3/uL (ref 0.0–0.5)
Eosinophils Relative: 1 %
HCT: 30.3 % — ABNORMAL LOW (ref 39.0–52.0)
Hemoglobin: 9.8 g/dL — ABNORMAL LOW (ref 13.0–17.0)
Immature Granulocytes: 1 %
Lymphocytes Relative: 16 %
Lymphs Abs: 0.5 10*3/uL — ABNORMAL LOW (ref 0.7–4.0)
MCH: 29.3 pg (ref 26.0–34.0)
MCHC: 32.3 g/dL (ref 30.0–36.0)
MCV: 90.7 fL (ref 80.0–100.0)
Monocytes Absolute: 0.6 10*3/uL (ref 0.1–1.0)
Monocytes Relative: 17 %
Neutro Abs: 2.3 10*3/uL (ref 1.7–7.7)
Neutrophils Relative %: 65 %
Platelet Count: 150 10*3/uL (ref 150–400)
RBC: 3.34 MIL/uL — ABNORMAL LOW (ref 4.22–5.81)
RDW: 14.6 % (ref 11.5–15.5)
WBC Count: 3.5 10*3/uL — ABNORMAL LOW (ref 4.0–10.5)
nRBC: 0 % (ref 0.0–0.2)

## 2023-07-12 MED ORDER — SODIUM CHLORIDE 0.9% FLUSH
10.0000 mL | INTRAVENOUS | Status: DC | PRN
  Administered 2023-07-12: 10 mL

## 2023-07-12 MED ORDER — HEPARIN SOD (PORK) LOCK FLUSH 100 UNIT/ML IV SOLN
500.0000 [IU] | Freq: Once | INTRAVENOUS | Status: AC | PRN
  Administered 2023-07-12: 500 [IU]

## 2023-07-13 ENCOUNTER — Other Ambulatory Visit: Payer: Managed Care, Other (non HMO)

## 2023-07-13 ENCOUNTER — Encounter: Payer: Self-pay | Admitting: Physician Assistant

## 2023-07-13 ENCOUNTER — Encounter: Payer: Self-pay | Admitting: Internal Medicine

## 2023-07-13 ENCOUNTER — Ambulatory Visit: Payer: Managed Care, Other (non HMO) | Admitting: Internal Medicine

## 2023-07-13 ENCOUNTER — Ambulatory Visit: Payer: Managed Care, Other (non HMO)

## 2023-07-15 NOTE — Progress Notes (Unsigned)
 [facility] OFFICE PROGRESS NOTE  [name], NP [name] [place] [address]  DIAGNOSIS: Stage IV (T3, N0, M1C) non-small cell lung cancer, adenocarcinoma.  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 He was diagnosed in July 2021.   Molecular Biomarkers:  MSI-High DETECTED Pembrolizumab Atezolizumab, Avelumab, Cemiplimab, Dostarlimab, Durvalumab, Ipilimumab, Nivolumab   STK11Splice Site SNV 1.9% Everolimus, Temsirolimus Yes   KRASG12D 1.7% Binimetinib Yes   [id number] 0.4%   Niraparib, Olaparib, Rucaparib, Talazoparib, Tazemetostat Yes  PRIOR THERAPY: 1) SRS to the solitary brain metastasis under the care of Dr. [name]. Last treatment 11/14/19. 2) Weekly concurrent chemoradiation with carboplatin for an AUC of 2, paclitaxel 45 mg/m2.  First dose expected on 11/25/2019. Status post 7 cycles, last dose was given 01/06/2020 with partial response.  3)  Immunotherapy with Keytruda 200 mg IV every 3 weeks.  First dose February 10, 2020 for a patient with MSI high.  Status post 35  cycles. 4) Avastin 15 mg/KG every 3 weeks.  First dose today for the vasogenic edema of the brain.S/P 7 cycles. 5) The patient had evidence for local disease recurrence in July 2024. 6) SRS to the occipital brain lesion under the care of Dr. [name] on 12/22/22 7)  UHRT under the care of Dr. [name], last dose expected on 03/03/23 8) SRS to the new metastatic brain lesion on 03/07/23 9) Resuming his treatment with Keytruda 200 Mg IV every 3 weeks. First dose 11/16/2022. Status post 10 cycles  10) SRS to the new metastatic brain lesions under the care of Dr. [name], completed on 06/21/2023  CURRENT THERAPY:  Palliative systemic chemotherapy with carboplatin for an AUC of 5, alimta 500 mg/m, and Keytruda. First dose on 06/28/23. He is status post 1 cycle.   INTERVAL HISTORY: Joseph Atkins  63 y.o. male returns to the clinic today for a follow-up visit accompanied by his wife.  In summary, the patient is found to have evidence of disease progression in the central aspect of the right lower lobe concerning for local recurrence in the Summer of 2024.  Therefore, he was resumed on systemic treatment with immunotherapy with Keytruda.  He tolerates his Keytruda well.  Unfortunately, the patient has been having a lot of recurrent metastatic disease to the brain. He saw Dr. Barbaraann Cao on 06/06/2023. Unfortunately, the patient had a repeat brain MRI that showed 8 novel metastases.  He underwent salvage SRS under the care of Dr. [name]. This was completed today on 06/21/2023.  Neuro-oncologist, Dr. Barbaraann Cao recommended, discussing systemic therapy options, such as the addition of chemotherapy, in light of ongoing progression in the CNS compartment.   At his last appointment, additional chemotherapy was added. He is status post his first cycle and tolerated it fair up until he was out working in the yard for an hour and a half and burning sticks.  After that time he has been struggling with nasal congestion and sinus issues.  He also has a cough but he reports its most similar to when he had bad severe allergies in the past.  He takes Zyrtec every day.  He reports overall his symptoms are stable to slightly improved over the last few days.  He is trying to stay inside the house more and wear a mask when going outside.  In the mornings, he states that he has a lot of mucus to the point that  he throws up mucus and feels better. He is taking NyQuil at night and uses Vicks and his nasal congestion is not as bad but still present.  He also has Hycodan at home if needed for cough.   He tells me he had 1 episode of hematuria a few weeks ago and believes he passed a kidney stone.  He declined urinalysis today and denies any dysuria, malodorous urine, or any other urinary changes.  He denies any back pain out of his ordinary  baseline back pain.  He denies any fevers.  He denies any sick contacts.  His shortness of breath is stable to slightly improved since he is lost some weight recently.  Denies any chest pain or hemoptysis.  He also reports he stopped taking his Decadron and weaned himself off since he was last seen about a week and a half ago.  He also reports fatigue but he also is having difficulty sleeping.  His PCP has him on a medication called Caplyta to help him sleep. He reports he has been on this and this is not a new medication.  The patient struggles with chronic constipation.  He has IBS constipation subtype.  Today, he states that his constipation is his baseline.  Denies any new rashes or itching. He is here for evaluation and repeat blood work before undergoing cycle #2.   MEDICAL HISTORY: Past Medical History:  Diagnosis Date   Cancer (HCC)    lung cancer   Diverticulosis    GERD (gastroesophageal reflux disease)    Hx of small bowel obstruction    Hyperlipidemia    Hypertension    Hypothyroidism    IBS (irritable bowel syndrome)    Substance abuse (HCC)    Alcoholic, Drug addition   Thyroid disease     ALLERGIES:  is allergic to penicillins.  MEDICATIONS:  Current Outpatient Medications  Medication Sig Dispense Refill   azithromycin (ZITHROMAX Z-PAK) 250 MG tablet Take as directed 6 each 0   lumateperone tosylate (CAPLYTA) 10.5 MG capsule Take 42 mg by mouth daily. For sleep     albuterol (PROVENTIL HFA;VENTOLIN HFA) 108 (90 Base) MCG/ACT inhaler Inhale 2 puffs into the lungs every 6 (six) hours as needed for wheezing or shortness of breath. 1 Inhaler 0   amLODipine (NORVASC) 5 MG tablet TAKE 1 TABLET (5 MG TOTAL) BY MOUTH DAILY. 90 tablet 1   ANORO ELLIPTA 62.5-25 MCG/INH AEPB 1 puff daily.     augmented betamethasone dipropionate (DIPROLENE-AF) 0.05 % cream Apply topically 2 (two) times daily.     benzonatate (TESSALON) 100 MG capsule Take 1 capsule (100 mg total) by mouth 3  (three) times daily as needed. 30 capsule 2   [employer] ASPIRIN LOW DOSE 81 MG tablet TAKE 1 TABLET (81 MG TOTAL) BY MOUTH DAILY. SWALLOW WHOLE. 90 tablet 3   cyclobenzaprine (FLEXERIL) 10 MG tablet Take 10 mg by mouth 2 (two) times daily as needed for muscle spasms.     dexamethasone (DECADRON) 1 MG tablet Take 1 tablet (1 mg total) by mouth daily with breakfast. (Patient not taking: Reported on 07/17/2023) 60 tablet 1   docusate sodium (COLACE) 100 MG capsule Take 100 mg by mouth daily.     DULoxetine (CYMBALTA) 20 MG capsule Take 1 capsule (20 mg total) by mouth 2 (two) times daily. 60 capsule 5   fluticasone furoate-vilanterol (BREO ELLIPTA) 100-25 MCG/INH AEPB Inhale 1 puff into the lungs daily. 30 each 3   folic acid (FOLVITE) 1 MG  tablet TAKE 1 TABLET BY MOUTH EVERY DAY 90 tablet 0   HYDROcodone bit-homatropine (HYCODAN) 5-1.5 MG/5ML syrup Take 5 mLs by mouth every 6 (six) hours as needed for cough. 473 mL 0   hydrOXYzine (ATARAX) 25 MG tablet Take 25 mg by mouth at bedtime.     levothyroxine (SYNTHROID) 100 MCG tablet TAKE 1 TABLET BY MOUTH EVERY DAY 90 tablet 1   lidocaine-prilocaine (EMLA) cream Apply 1 application topically as needed. 30 g 0   omeprazole (PRILOSEC) 40 MG capsule Take 1 capsule (40 mg total) by mouth daily. 90 capsule 4   ondansetron (ZOFRAN) 4 MG tablet Take 1 tablet (4 mg total) by mouth every 8 (eight) hours as needed. 40 tablet 2   oxyCODONE-acetaminophen (PERCOCET/ROXICET) 5-325 MG tablet Take 1 tablet by mouth every 4 (four) hours as needed for severe pain. 30 tablet 0   pembrolizumab (KEYTRUDA) 100 MG/4ML SOLN See admin instructions.     polyethylene glycol powder (MIRALAX) powder Take 17 g by mouth daily. 255 g 11   prochlorperazine (COMPAZINE) 10 MG tablet Take 1 tablet (10 mg total) by mouth every 6 (six) hours as needed. 30 tablet 2   temazepam (RESTORIL) 30 MG capsule Take 1 capsule (30 mg total) by mouth at bedtime as needed for sleep. 30 capsule 0   triamcinolone  cream (KENALOG) 0.1 % APPLY TOPICALLY 2 TIMES DAILY AS NEEDED. 454 g 0   TRULANCE 3 MG TABS Take 1 tablet by mouth daily.     No current facility-administered medications for this visit.    SURGICAL HISTORY:  Past Surgical History:  Procedure Laterality Date   arm surgery Right    BRONCHIAL BRUSHINGS  10/24/2019   Procedure: BRONCHIAL BRUSHINGS;  Surgeon: Leslye Peer, MD;  Location: [facility] ENDOSCOPY;  Service: Cardiopulmonary;;  right lower lobe    BRONCHIAL BRUSHINGS  11/05/2019   Procedure: BRONCHIAL BRUSHINGS;  Surgeon: Leslye Peer, MD;  Location: [facility] ENDOSCOPY;  Service: Pulmonary;;   BRONCHIAL NEEDLE ASPIRATION BIOPSY  10/24/2019   Procedure: BRONCHIAL NEEDLE ASPIRATION BIOPSIES;  Surgeon: Leslye Peer, MD;  Location: [facility] ENDOSCOPY;  Service: Cardiopulmonary;;   BRONCHIAL NEEDLE ASPIRATION BIOPSY  11/05/2019   Procedure: BRONCHIAL NEEDLE ASPIRATION BIOPSIES;  Surgeon: Leslye Peer, MD;  Location: [facility] ENDOSCOPY;  Service: Pulmonary;;   ENDOBRONCHIAL ULTRASOUND N/A 10/24/2019   Procedure: ENDOBRONCHIAL ULTRASOUND;  Surgeon: Leslye Peer, MD;  Location[facility] ENDOSCOPY;  Service: Cardiopulmonary;  Laterality: N/A;   FINGER SURGERY Right    Middle   IR IMAGING GUIDED PORT INSERTION  11/19/2019   VIDEO BRONCHOSCOPY N/A 10/24/2019   Procedure: VIDEO BRONCHOSCOPY WITHOUT FLUORO;  Surgeon: Leslye Peer, MD;  Location: [facility] ENDOSCOPY;  Service: Cardiopulmonary;  Laterality: N/A;   VIDEO BRONCHOSCOPY WITH ENDOBRONCHIAL NAVIGATION N/A 11/05/2019   Procedure: VIDEO BRONCHOSCOPY WITH ENDOBRONCHIAL NAVIGATION;  Surgeon: Leslye Peer, MD;  Location: [facility] ENDOSCOPY;  Service: Pulmonary;  Laterality: N/A;    REVIEW OF SYSTEMS:   Constitutional: Positive for stable fatigue. Negative for chills and fever.  HENT: Positive for nasal congestion. Negative for mouth sores, nosebleeds, sore throat and trouble swallowing.   Eyes:  Negative for eye problems and icterus.  Respiratory: Stable shortness of  breath with exertion and cough. Negative for hemoptysis and wheezing.   Cardiovascular: Negative for chest pain. Gastrointestinal: Positive for chronic/frequent constipation (improved at this time). Negative for diarrhea, nausea and vomiting.  Genitourinary: Negative for bladder incontinence, difficulty urinating, dysuria, frequency and hematuria (one episode a few weeks  ago).   Musculoskeletal: Negative for back pain, gait problem, neck pain and neck stiffness.  Skin: Positive for occasional rashes and itching (none at this time).  Neurological:   Negative for extremity weakness,  light-headedness and seizures.  Hematological: Negative for adenopathy. Does not bruise/bleed easily.  Psychiatric/Behavioral: Negative for confusion, depression and sleep disturbance. The patient is not nervous/anxious.     PHYSICAL EXAMINATION:  Blood pressure 128/72, pulse (!) 111, temperature 97.6 F (36.4 C), temperature source Tympanic, resp. rate 19, weight 185 lb 4.8 oz (84.1 kg), SpO2 99%.  ECOG PERFORMANCE STATUS: 1  Physical Exam  Constitutional: Oriented to person, place, and time and well-developed, well-nourished, and in no distress. Marland Kitchen  HENT:  Head: Normocephalic and atraumatic.  Mouth/Throat: Oropharynx is clear and moist. No oropharyngeal exudate.  Eyes: Conjunctivae are normal. Right eye exhibits no discharge. Left eye exhibits no discharge. No scleral icterus.  Neck: Normal range of motion. Neck supple.  Cardiovascular: Normal rate, regular rhythm, normal heart sounds and intact distal pulses.   Pulmonary/Chest: Effort normal. Quiet breath sounds bilaterally. No respiratory distress.  No rales.  Abdominal: Soft. Bowel sounds are normal. Exhibits no distension and no mass. There is no tenderness.  Musculoskeletal: Normal range of motion. Exhibits no edema.  Lymphadenopathy:    No cervical adenopathy.  Neurological: Alert and oriented to person, place, and time. Exhibits normal muscle tone.  Gait normal. Coordination normal.  Skin: Skin is warm and dry. No rash noted. Not diaphoretic. No erythema. No pallor.  Psychiatric: Mood, memory and judgment normal.  Vitals reviewed.  LABORATORY DATA: Lab Results  Component Value Date   WBC 5.1 07/19/2023   HGB 9.7 (L) 07/19/2023   HCT 30.6 (L) 07/19/2023   MCV 92.2 07/19/2023   PLT 312 07/19/2023      Chemistry      Component Value Date/Time   NA 135 07/19/2023 0743   NA 141 03/08/2017 1707   K 3.9 07/19/2023 0743   CL 102 07/19/2023 0743   CO2 26 07/19/2023 0743   BUN 5 (L) 07/19/2023 0743   BUN 12 03/08/2017 1707   CREATININE 0.96 07/19/2023 0743      Component Value Date/Time   CALCIUM 8.5 (L) 07/19/2023 0743   ALKPHOS 61 07/19/2023 0743   AST 13 (L) 07/19/2023 0743   ALT 11 07/19/2023 0743   BILITOT 0.7 07/19/2023 0743       RADIOGRAPHIC STUDIES:  No results found.   ASSESSMENT/PLAN:  This is a very pleasant 63 year old Caucasian male diagnosed with stage IV non-small cell lung cancer, adenocarcinoma.  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 He was diagnosed in July 2021. His molecular studies by [facility] show he has MSI high which is a good marker for response to immunotherapy which will be important for future treatment.     The patient will complete SRS to the solidary brain metastasis under the care of Dr. [name] later today on 11/14/19.    He then underwent a course of weekly concurrent chemoradiation with carboplatin for an AUC of 2 and paclitaxel 45 mg per metered squared.  He tolerated this treatment well except for fatigue and mild odynophagia.    The patient showed evidence of disease progression with an increase in volume of the right infrahilar mass.  Since the patient has MSI high, the patient then underwent treatment with single agent immunotherapy with Keytruda 200 mg IV every 3  weeks.   The patient is status  post 35 cycles and completed his 2 years of treatment on 03/23/22.  He also received 7 cycles of Avastin for vasogenic edema in the brain.  Avastin has been on hold unless needed again in the future.     He has SRS to an occipital brain lesion on 12/22/22 by Dr. [name]    He had evidence of progression on his surveillance imaging from July 2024. Therefore, he resumed immunotherapy with Keytruda. He is status post 9 cycles.    He underwent UHRT in the last day was on 03/03/2023 for some disease progression that was seen at that time. He is also was found to have new metastatic disease to the brain and had SRS on 03/07/23.   Unfortunately, the patient recently had continued CNS progression.  He is undergoing SRS under the care of Dr. [name] in the last day was earlier today on 06/21/2023.    In light of the progression in the brain, Dr. Arbutus Ped and the patient are in agreement to try to add on systemic chemotherapy with carboplatin for an AUC of 5, Alimta 500 mg/m, and Keytruda 200 mg IV every 3 weeks to see if it helps with his progression in the brain.  He started his first dose of this treatment on 06/28/23. He is status post 1 cycle.   He reports allergies and nasal congestion which he has a history of. He feels well for treatment today. No leukocytosis and he is afebrile. I will send him antibiotics for possible sinus infection although this could be due to allergies. He takes zyrtec daily. We will obtain a chest x-ray today just to ensure no pneumonia, although the patient understands that sometimes CXR has limitations in the setting of lung cancer and post radiation treatment.  He has Hycodan for cough at home.  He does not need a refill at this time per patient report.  Labs were reviewed. Recommend he proceed with cycle #2 today as scheduled.   We will see him back in 3 weeks for evaluation and repeat blood work before undergoing cycle #3.   We discussed signs and symptoms that would warrant  reevaluation such as fevers, worsening cough, shortness of breath, malaise, etc.  The patient had 1 episode of hematuria a few weeks ago.  The patient believes he passed a kidney stone.  I offered a UA today but the patient declined since he is no longer having any urinary symptoms at this time.  The patient was advised to call immediately if she has any concerning symptoms in the interval. The patient voices understanding of current disease status and treatment options and is in agreement with the current care plan. All questions were answered. The patient knows to call the clinic with any problems, questions or concerns. We can certainly see the patient much sooner if necessary        Orders Placed This Encounter  Procedures   DG Chest 2 View    Standing Status:   Future    Expected Date:   07/19/2023    Expiration Date:   07/18/2024    Reason for Exam (SYMPTOM  OR DIAGNOSIS REQUIRED):   Lung cancer, increased cough, had radiation to lung. wants to make sure no infection but he believes due to sinus and allergies    Preferred imaging location?:   [facility]     The total time spent in the appointment was 20-29 minutes  Riti Rollyson L Kyros Salzwedel,  PA-C 07/19/23

## 2023-07-17 ENCOUNTER — Ambulatory Visit
Admission: RE | Admit: 2023-07-17 | Discharge: 2023-07-17 | Disposition: A | Discharge status: Home / Self Care | Payer: Managed Care, Other (non HMO) | Source: Ambulatory Visit | Attending: Radiation Oncology | Admitting: Radiation Oncology

## 2023-07-17 NOTE — Progress Notes (Signed)
  Radiation Oncology         [phone]) [phone] ________________________________  Name: Joseph Atkins MRN: [id number]  Date of Service: 07/17/2023  DOB: January 16, 1961  Post Treatment Telephone Note  Diagnosis:  Stage IV, cT3, N0, M1c, non-small cell lung cancer, adenocarcinoma of the right lower lobe with locally recurrent disease (as documented in provider EOT note)  The patient was available for call today.  The patient did  note fatigue during radiation. The patient did not note hair loss or skin changes in the field of radiation during therapy. The patient is not taking dexamethasone. The patient does not have symptoms of  weakness or loss of control of the extremities. The patient does not have symptoms of headache. The patient does not have symptoms of seizure or uncontrolled movement. The patient does not have symptoms of changes in vision. The patient does not have changes in speech. The patient does not have confusion.   The patient was counseled that he  will be contacted by our brain and spine navigator to schedule surveillance imaging. The patient was encouraged to call if he  have not received a call to schedule imaging, or if he  develops concerns or questions regarding radiation. The patient will also continue to follow up with Dr. Barbaraann Cao in medical oncology.   This concludes the interaction.  [name], LPN

## 2023-07-18 MED FILL — Fosaprepitant Dimeglumine For IV Infusion 150 MG (Base Eq): INTRAVENOUS | Qty: 5 | Status: AC

## 2023-07-19 ENCOUNTER — Telehealth: Payer: Self-pay

## 2023-07-19 ENCOUNTER — Other Ambulatory Visit: Payer: Self-pay

## 2023-07-19 ENCOUNTER — Inpatient Hospital Stay: Payer: Managed Care, Other (non HMO)

## 2023-07-19 ENCOUNTER — Inpatient Hospital Stay (HOSPITAL_BASED_OUTPATIENT_CLINIC_OR_DEPARTMENT_OTHER): Payer: Managed Care, Other (non HMO) | Admitting: Physician Assistant

## 2023-07-19 ENCOUNTER — Ambulatory Visit (HOSPITAL_COMMUNITY)
Admission: RE | Admit: 2023-07-19 | Discharge: 2023-07-19 | Disposition: A | Discharge status: Home / Self Care | Source: Ambulatory Visit | Attending: Physician Assistant | Admitting: Physician Assistant

## 2023-07-19 VITALS — BP 128/72 | HR 111 | Temp 97.6°F | Resp 19 | Wt 185.3 lb

## 2023-07-19 VITALS — HR 98

## 2023-07-19 DIAGNOSIS — Z5111 Encounter for antineoplastic chemotherapy: Secondary | ICD-10-CM | POA: Diagnosis not present

## 2023-07-19 DIAGNOSIS — C3491 Malignant neoplasm of unspecified part of right bronchus or lung: Secondary | ICD-10-CM | POA: Insufficient documentation

## 2023-07-19 DIAGNOSIS — C7931 Secondary malignant neoplasm of brain: Secondary | ICD-10-CM

## 2023-07-19 DIAGNOSIS — Z5112 Encounter for antineoplastic immunotherapy: Secondary | ICD-10-CM

## 2023-07-19 DIAGNOSIS — Z95828 Presence of other vascular implants and grafts: Secondary | ICD-10-CM

## 2023-07-19 LAB — CMP (CANCER CENTER ONLY)
ALT: 11 U/L (ref 0–44)
AST: 13 U/L — ABNORMAL LOW (ref 15–41)
Albumin: 3.6 g/dL (ref 3.5–5.0)
Alkaline Phosphatase: 61 U/L (ref 38–126)
Anion gap: 7 (ref 5–15)
BUN: 5 mg/dL — ABNORMAL LOW (ref 8–23)
CO2: 26 mmol/L (ref 22–32)
Calcium: 8.5 mg/dL — ABNORMAL LOW (ref 8.9–10.3)
Chloride: 102 mmol/L (ref 98–111)
Creatinine: 0.96 mg/dL (ref 0.61–1.24)
GFR, Estimated: >60 mL/min (ref >60)
Glucose, Bld: 162 mg/dL — ABNORMAL HIGH (ref 70–99)
Potassium: 3.9 mmol/L (ref 3.5–5.1)
Sodium: 135 mmol/L (ref 135–145)
Total Bilirubin: 0.7 mg/dL (ref 0.0–1.2)
Total Protein: 6.6 g/dL (ref 6.5–8.1)

## 2023-07-19 LAB — CBC WITH DIFFERENTIAL (CANCER CENTER ONLY)
Abs Immature Granulocytes: 0.05 10*3/uL (ref 0.00–0.07)
Basophils Absolute: 0 10*3/uL (ref 0.0–0.1)
Basophils Relative: 0 %
Eosinophils Absolute: 0 10*3/uL (ref 0.0–0.5)
Eosinophils Relative: 0 %
HCT: 30.6 % — ABNORMAL LOW (ref 39.0–52.0)
Hemoglobin: 9.7 g/dL — ABNORMAL LOW (ref 13.0–17.0)
Immature Granulocytes: 1 %
Lymphocytes Relative: 11 %
Lymphs Abs: 0.6 10*3/uL — ABNORMAL LOW (ref 0.7–4.0)
MCH: 29.2 pg (ref 26.0–34.0)
MCHC: 31.7 g/dL (ref 30.0–36.0)
MCV: 92.2 fL (ref 80.0–100.0)
Monocytes Absolute: 0.9 10*3/uL (ref 0.1–1.0)
Monocytes Relative: 17 %
Neutro Abs: 3.6 10*3/uL (ref 1.7–7.7)
Neutrophils Relative %: 71 %
Platelet Count: 312 10*3/uL (ref 150–400)
RBC: 3.32 MIL/uL — ABNORMAL LOW (ref 4.22–5.81)
RDW: 15.4 % (ref 11.5–15.5)
WBC Count: 5.1 10*3/uL (ref 4.0–10.5)
nRBC: 0 % (ref 0.0–0.2)

## 2023-07-19 MED ORDER — SODIUM CHLORIDE 0.9 % IV SOLN
595.5000 mg | Freq: Once | INTRAVENOUS | Status: AC
  Administered 2023-07-19: 600 mg via INTRAVENOUS
  Filled 2023-07-19: qty 60

## 2023-07-19 MED ORDER — SODIUM CHLORIDE 0.9 % IV SOLN
150.0000 mg | Freq: Once | INTRAVENOUS | Status: AC
  Administered 2023-07-19: 150 mg via INTRAVENOUS
  Filled 2023-07-19: qty 150

## 2023-07-19 MED ORDER — DEXAMETHASONE SODIUM PHOSPHATE 10 MG/ML IJ SOLN
10.0000 mg | Freq: Once | INTRAMUSCULAR | Status: AC
  Administered 2023-07-19: 10 mg via INTRAVENOUS
  Filled 2023-07-19: qty 1

## 2023-07-19 MED ORDER — AZITHROMYCIN 250 MG PO TABS: ORAL_TABLET | ORAL | 0 refills | Status: DC

## 2023-07-19 MED ORDER — SODIUM CHLORIDE 0.9% FLUSH
10.0000 mL | INTRAVENOUS | Status: DC | PRN
  Administered 2023-07-19: 10 mL

## 2023-07-19 MED ORDER — SODIUM CHLORIDE 0.9 % IV SOLN: INTRAVENOUS | Status: DC

## 2023-07-19 MED ORDER — HEPARIN SOD (PORK) LOCK FLUSH 100 UNIT/ML IV SOLN
500.0000 [IU] | Freq: Once | INTRAVENOUS | Status: AC | PRN
  Administered 2023-07-19: 500 [IU]

## 2023-07-19 MED ORDER — SODIUM CHLORIDE 0.9 % IV SOLN
200.0000 mg | Freq: Once | INTRAVENOUS | Status: AC
  Administered 2023-07-19: 200 mg via INTRAVENOUS
  Filled 2023-07-19: qty 200

## 2023-07-19 MED ORDER — PEMETREXED DISODIUM CHEMO 500 MG/20ML IV SOLN
500.0000 mg/m2 | Freq: Once | INTRAVENOUS | Status: AC
  Administered 2023-07-19: 1000 mg via INTRAVENOUS
  Filled 2023-07-19: qty 40

## 2023-07-19 MED ORDER — PALONOSETRON HCL INJECTION 0.25 MG/5ML
0.2500 mg | Freq: Once | INTRAVENOUS | Status: AC
  Administered 2023-07-19: 0.25 mg via INTRAVENOUS
  Filled 2023-07-19: qty 5

## 2023-07-19 NOTE — Patient Instructions (Signed)
 [facility] - A DEPT [facility]  Discharge Instructions: Thank you for choosing [facility] to provide your oncology and hematology care.   If you have a lab appointment with the Cancer Center, please go directly to the Cancer Center and check in at the registration area.   Wear comfortable clothing and clothing appropriate for easy access to any Portacath or PICC line.   We strive to give you quality time with your provider. You may need to reschedule your appointment if you arrive late (15 or more minutes).  Arriving late affects you and other patients whose appointments are after yours.  Also, if you miss three or more appointments without notifying the office, you may be dismissed from the clinic at the provider's discretion.      For prescription refill requests, have your pharmacy contact our office and allow 72 hours for refills to be completed.    Today you received the following chemotherapy and/or immunotherapy agents: Keytruda, Alimta, Carboplatin      To help prevent nausea and vomiting after your treatment, we encourage you to take your nausea medication as directed.  BELOW ARE SYMPTOMS THAT SHOULD BE REPORTED IMMEDIATELY: *FEVER GREATER THAN 100.4 F (38 C) OR HIGHER *CHILLS OR SWEATING *NAUSEA AND VOMITING THAT IS NOT CONTROLLED WITH YOUR NAUSEA MEDICATION *UNUSUAL SHORTNESS OF BREATH *UNUSUAL BRUISING OR BLEEDING *URINARY PROBLEMS (pain or burning when urinating, or frequent urination) *BOWEL PROBLEMS (unusual diarrhea, constipation, pain near the anus) TENDERNESS IN MOUTH AND THROAT WITH OR WITHOUT PRESENCE OF ULCERS (sore throat, sores in mouth, or a toothache) UNUSUAL RASH, SWELLING OR PAIN  UNUSUAL VAGINAL DISCHARGE OR ITCHING   Items with * indicate a potential emergency and should be followed up as soon as possible or go to the Emergency Department if any problems should occur.  Please show the CHEMOTHERAPY ALERT  CARD or IMMUNOTHERAPY ALERT CARD at check-in to the Emergency Department and triage nurse.  Should you have questions after your visit or need to cancel or reschedule your appointment, please contact [facility] - [facility] DE[facility]  Dept: [phone]  and follow the prompts.  Office hours are 8:00 a.m. to 4:30 p.m. Monday - Friday. Please note that voicemails left after 4:00 p.m. may not be returned until the following business day.  We are closed weekends and major holidays. You have access to a nurse at all times for urgent questions. Please call the main number to the clinic Dept: [phone] and follow the prompts.   For any non-urgent questions, you may also contact your provider using MyChart. We now offer e-Visits for anyone 35 and older to request care online for non-urgent symptoms. For details visit [email or web address].   Also download the MyChart app! Go to the app store, search "MyChart", open the app, select [place], and log in with your MyChart username and password.

## 2023-07-19 NOTE — Telephone Encounter (Signed)
 Patient reports having blood in urine after treatment today.  Patient reports the first time, bright red blood, and second time, more of a darkish tint of urine.  Per Dr. Arbutus Ped- patient needs urinalysis and urine culture.  Orders placed.  Patients wife has an appt in [place] tomorrow and Diane, RN confirmed with [facility] that patient can do urinalysis and culture there.   Per Cassie, PA- Informed patient of Xray results showing a small-moderate right pleural effusion, but does not need to be drained at this time.  If symptoms of SOB and cough worsen to please call the office and another Xray can be done or proceed to ER if needed. Continue taking antibiotics and cough medicine.  Patient verbalized understanding.

## 2023-07-20 ENCOUNTER — Other Ambulatory Visit: Payer: Self-pay

## 2023-07-20 ENCOUNTER — Other Ambulatory Visit (HOSPITAL_COMMUNITY)
Admission: RE | Admit: 2023-07-20 | Discharge: 2023-07-20 | Disposition: A | Discharge status: Home / Self Care | Source: Ambulatory Visit | Attending: Internal Medicine | Admitting: Internal Medicine

## 2023-07-20 DIAGNOSIS — C3491 Malignant neoplasm of unspecified part of right bronchus or lung: Secondary | ICD-10-CM | POA: Diagnosis present

## 2023-07-20 DIAGNOSIS — N390 Urinary tract infection, site not specified: Secondary | ICD-10-CM | POA: Diagnosis not present

## 2023-07-20 DIAGNOSIS — C7931 Secondary malignant neoplasm of brain: Secondary | ICD-10-CM | POA: Insufficient documentation

## 2023-07-20 LAB — URINALYSIS, COMPLETE (UACMP) WITH MICROSCOPIC
Bilirubin Urine: NEGATIVE
Glucose, UA: NEGATIVE mg/dL
Hgb urine dipstick: NEGATIVE
Ketones, ur: NEGATIVE mg/dL
Leukocytes,Ua: NEGATIVE
Nitrite: NEGATIVE
Protein, ur: NEGATIVE mg/dL
Specific Gravity, Urine: 1.006 (ref 1.005–1.030)
pH: 6 (ref 5.0–8.0)

## 2023-07-21 LAB — URINE CULTURE: Culture: NO GROWTH

## 2023-07-26 ENCOUNTER — Other Ambulatory Visit: Payer: Self-pay | Admitting: Physician Assistant

## 2023-07-26 ENCOUNTER — Inpatient Hospital Stay: Attending: Internal Medicine

## 2023-07-26 ENCOUNTER — Telehealth: Payer: Self-pay

## 2023-07-26 ENCOUNTER — Inpatient Hospital Stay: Payer: Managed Care, Other (non HMO)

## 2023-07-26 DIAGNOSIS — Z5189 Encounter for other specified aftercare: Secondary | ICD-10-CM | POA: Insufficient documentation

## 2023-07-26 DIAGNOSIS — Z5111 Encounter for antineoplastic chemotherapy: Secondary | ICD-10-CM | POA: Diagnosis not present

## 2023-07-26 DIAGNOSIS — C3431 Malignant neoplasm of lower lobe, right bronchus or lung: Secondary | ICD-10-CM | POA: Insufficient documentation

## 2023-07-26 DIAGNOSIS — E039 Hypothyroidism, unspecified: Secondary | ICD-10-CM | POA: Diagnosis not present

## 2023-07-26 DIAGNOSIS — K5790 Diverticulosis of intestine, part unspecified, without perforation or abscess without bleeding: Secondary | ICD-10-CM | POA: Diagnosis not present

## 2023-07-26 DIAGNOSIS — Z79899 Other long term (current) drug therapy: Secondary | ICD-10-CM | POA: Insufficient documentation

## 2023-07-26 DIAGNOSIS — J90 Pleural effusion, not elsewhere classified: Secondary | ICD-10-CM | POA: Diagnosis not present

## 2023-07-26 DIAGNOSIS — E785 Hyperlipidemia, unspecified: Secondary | ICD-10-CM | POA: Insufficient documentation

## 2023-07-26 DIAGNOSIS — K219 Gastro-esophageal reflux disease without esophagitis: Secondary | ICD-10-CM | POA: Diagnosis not present

## 2023-07-26 DIAGNOSIS — Z79890 Hormone replacement therapy (postmenopausal): Secondary | ICD-10-CM | POA: Insufficient documentation

## 2023-07-26 DIAGNOSIS — C7931 Secondary malignant neoplasm of brain: Secondary | ICD-10-CM | POA: Insufficient documentation

## 2023-07-26 DIAGNOSIS — Z95828 Presence of other vascular implants and grafts: Secondary | ICD-10-CM

## 2023-07-26 DIAGNOSIS — K589 Irritable bowel syndrome without diarrhea: Secondary | ICD-10-CM | POA: Diagnosis not present

## 2023-07-26 DIAGNOSIS — Z7951 Long term (current) use of inhaled steroids: Secondary | ICD-10-CM | POA: Diagnosis not present

## 2023-07-26 DIAGNOSIS — D72819 Decreased white blood cell count, unspecified: Secondary | ICD-10-CM | POA: Insufficient documentation

## 2023-07-26 DIAGNOSIS — D649 Anemia, unspecified: Secondary | ICD-10-CM | POA: Insufficient documentation

## 2023-07-26 DIAGNOSIS — Z7982 Long term (current) use of aspirin: Secondary | ICD-10-CM | POA: Diagnosis not present

## 2023-07-26 DIAGNOSIS — Z7952 Long term (current) use of systemic steroids: Secondary | ICD-10-CM | POA: Diagnosis not present

## 2023-07-26 DIAGNOSIS — I10 Essential (primary) hypertension: Secondary | ICD-10-CM | POA: Insufficient documentation

## 2023-07-26 DIAGNOSIS — M79606 Pain in leg, unspecified: Secondary | ICD-10-CM | POA: Insufficient documentation

## 2023-07-26 DIAGNOSIS — F1010 Alcohol abuse, uncomplicated: Secondary | ICD-10-CM | POA: Diagnosis not present

## 2023-07-26 DIAGNOSIS — C3491 Malignant neoplasm of unspecified part of right bronchus or lung: Secondary | ICD-10-CM

## 2023-07-26 LAB — CBC WITH DIFFERENTIAL (CANCER CENTER ONLY)
Abs Immature Granulocytes: 0.01 10*3/uL (ref 0.00–0.07)
Basophils Absolute: 0 10*3/uL (ref 0.0–0.1)
Basophils Relative: 2 %
Eosinophils Absolute: 0 10*3/uL (ref 0.0–0.5)
Eosinophils Relative: 0 %
HCT: 27.8 % — ABNORMAL LOW (ref 39.0–52.0)
Hemoglobin: 9 g/dL — ABNORMAL LOW (ref 13.0–17.0)
Immature Granulocytes: 1 %
Lymphocytes Relative: 38 %
Lymphs Abs: 0.4 10*3/uL — ABNORMAL LOW (ref 0.7–4.0)
MCH: 29 pg (ref 26.0–34.0)
MCHC: 32.4 g/dL (ref 30.0–36.0)
MCV: 89.7 fL (ref 80.0–100.0)
Monocytes Absolute: 0.2 10*3/uL (ref 0.1–1.0)
Monocytes Relative: 18 %
Neutro Abs: 0.5 10*3/uL — ABNORMAL LOW (ref 1.7–7.7)
Neutrophils Relative %: 41 %
Platelet Count: 198 10*3/uL (ref 150–400)
RBC: 3.1 MIL/uL — ABNORMAL LOW (ref 4.22–5.81)
RDW: 14.7 % (ref 11.5–15.5)
Smear Review: NORMAL
WBC Count: 1.1 10*3/uL — ABNORMAL LOW (ref 4.0–10.5)
nRBC: 0 % (ref 0.0–0.2)

## 2023-07-26 LAB — CMP (CANCER CENTER ONLY)
ALT: 23 U/L (ref 0–44)
AST: 17 U/L (ref 15–41)
Albumin: 3.7 g/dL (ref 3.5–5.0)
Alkaline Phosphatase: 65 U/L (ref 38–126)
Anion gap: 7 (ref 5–15)
BUN: 10 mg/dL (ref 8–23)
CO2: 26 mmol/L (ref 22–32)
Calcium: 8.6 mg/dL — ABNORMAL LOW (ref 8.9–10.3)
Chloride: 98 mmol/L (ref 98–111)
Creatinine: 0.8 mg/dL (ref 0.61–1.24)
GFR, Estimated: >60 mL/min (ref >60)
Glucose, Bld: 107 mg/dL — ABNORMAL HIGH (ref 70–99)
Potassium: 4.1 mmol/L (ref 3.5–5.1)
Sodium: 131 mmol/L — ABNORMAL LOW (ref 135–145)
Total Bilirubin: 0.6 mg/dL (ref 0.0–1.2)
Total Protein: 6.8 g/dL (ref 6.5–8.1)

## 2023-07-26 MED ORDER — HEPARIN SOD (PORK) LOCK FLUSH 100 UNIT/ML IV SOLN
500.0000 [IU] | Freq: Once | INTRAVENOUS | Status: AC | PRN
Start: 2023-07-26 — End: 2023-07-26
  Administered 2023-07-26: 500 [IU]

## 2023-07-26 MED ORDER — SODIUM CHLORIDE 0.9% FLUSH
10.0000 mL | INTRAVENOUS | Status: DC | PRN
  Administered 2023-07-26: 10 mL

## 2023-07-26 NOTE — Telephone Encounter (Signed)
 TC to pt per C. Heilingoetter, PA to inform that she has ordered Zarxio injections to help prevent infection d/t low WBC. Informed that he will need this injection tomorrow, Friday, and Saturday (4/3-4/5). Also informed to take OTC claritin daily during this time per C. Heilingoetter's order. Neutropenic precautions discussed w/ pt. He is aware that scheduler will contact him soon to schedule injection appointments. He verbalizes understanding of all the above information.

## 2023-07-27 ENCOUNTER — Inpatient Hospital Stay

## 2023-07-27 ENCOUNTER — Ambulatory Visit: Payer: Managed Care, Other (non HMO)

## 2023-07-27 ENCOUNTER — Ambulatory Visit: Payer: Managed Care, Other (non HMO) | Admitting: Physician Assistant

## 2023-07-27 ENCOUNTER — Other Ambulatory Visit: Payer: Managed Care, Other (non HMO)

## 2023-07-27 VITALS — BP 124/81 | HR 99 | Temp 97.9°F | Resp 18

## 2023-07-27 DIAGNOSIS — C7931 Secondary malignant neoplasm of brain: Secondary | ICD-10-CM | POA: Diagnosis not present

## 2023-07-27 DIAGNOSIS — Z95828 Presence of other vascular implants and grafts: Secondary | ICD-10-CM

## 2023-07-27 MED ORDER — FILGRASTIM-SNDZ 480 MCG/0.8ML IJ SOSY
480.0000 ug | PREFILLED_SYRINGE | Freq: Once | INTRAMUSCULAR | Status: AC
Start: 2023-07-27 — End: 2023-07-27
  Administered 2023-07-27: 480 ug via SUBCUTANEOUS
  Filled 2023-07-27: qty 0.8

## 2023-07-27 NOTE — Patient Instructions (Signed)
 Filgrastim Injection What is this medication? FILGRASTIM (fil GRA stim) lowers the risk of infection in people who are receiving chemotherapy. It works by Systems analyst make more white blood cells, which protects your body from infection. It may also be used to help people who have been exposed to high doses of radiation. It can be used to help prepare your body before a stem cell transplant. It works by helping your bone marrow make and release stem cells into the blood. This medicine may be used for other purposes; ask your health care provider or pharmacist if you have questions. COMMON BRAND NAME(S): Neupogen, Nivestym, Nypozi, Releuko, Zarxio What should I tell my care team before I take this medication? They need to know if you have any of these conditions: History of blood diseases, such as sickle cell anemia Kidney disease Recent or ongoing radiation An unusual or allergic reaction to filgrastim, pegfilgrastim, latex, rubber, other medications, foods, dyes, or preservatives Pregnant or trying to get pregnant Breast-feeding How should I use this medication? This medication is injected under the skin or into a vein. It is usually given by your care team in a hospital or clinic setting. It may be given at home. If you get this medication at home, you will be taught how to prepare and give it. Use exactly as directed. Take it as directed on the prescription label at the same time every day. Keep taking it unless your care team tells you to stop. It is important that you put your used needles and syringes in a special sharps container. Do not put them in a trash can. If you do not have a sharps container, call your pharmacist or care team to get one. This medication comes with INSTRUCTIONS FOR USE. Ask your pharmacist for directions on how to use this medication. Read the information carefully. Talk to your pharmacist or care team if you have questions. Talk to your care team about the use of  this medication in children. While it may be prescribed for children for selected conditions, precautions do apply. Overdosage: If you think you have taken too much of this medicine contact a poison control center or emergency room at once. NOTE: This medicine is only for you. Do not share this medicine with others. What if I miss a dose? It is important not to miss any doses. Talk to your care team about what to do if you miss a dose. What may interact with this medication? Medications that may cause a release of neutrophils, such as lithium This list may not describe all possible interactions. Give your health care provider a list of all the medicines, herbs, non-prescription drugs, or dietary supplements you use. Also tell them if you smoke, drink alcohol, or use illegal drugs. Some items may interact with your medicine. What should I watch for while using this medication? Your condition will be monitored carefully while you are receiving this medication. You may need bloodwork while taking this medication. Talk to your care team about your risk of cancer. You may be more at risk for certain types of cancer if you take this medication. What side effects may I notice from receiving this medication? Side effects that you should report to your care team as soon as possible: Allergic reactions--skin rash, itching, hives, swelling of the face, lips, tongue, or throat Capillary leak syndrome--stomach or muscle pain, unusual weakness or fatigue, feeling faint or lightheaded, decrease in the amount of urine, swelling of the ankles, hands,  or feet, trouble breathing High white blood cell level--fever, fatigue, trouble breathing, night sweats, change in vision, weight loss Inflammation of the aorta--fever, fatigue, back, chest, or stomach pain, severe headache Kidney injury (glomerulonephritis)--decrease in the amount of urine, red or dark brown urine, foamy or bubbly urine, swelling of the ankles, hands,  or feet Shortness of breath or trouble breathing Spleen injury--pain in upper left stomach or shoulder Unusual bruising or bleeding Side effects that usually do not require medical attention (report to your care team if they continue or are bothersome): Back pain Bone pain Fatigue Fever Headache Nausea This list may not describe all possible side effects. Call your doctor for medical advice about side effects. You may report side effects to FDA at 1-800-FDA-1088. Where should I keep my medication? Keep out of the reach of children and pets. Keep this medication in the original packaging until you are ready to take it. Protect from light. See product for storage information. Each product may have different instructions. Get rid of any unused medication after the expiration date. To get rid of medications that are no longer needed or have expired: Take the medication to a medications take-back program. Check with your pharmacy or law enforcement to find a location. If you cannot return the medication, ask your pharmacist or care team how to get rid of this medication safely. NOTE: This sheet is a summary. It may not cover all possible information. If you have questions about this medicine, talk to your doctor, pharmacist, or health care provider.  2024 Elsevier/Gold Standard (2021-09-02 00:00:00)

## 2023-07-28 ENCOUNTER — Other Ambulatory Visit: Payer: Self-pay

## 2023-07-28 ENCOUNTER — Encounter: Payer: Self-pay | Admitting: Internal Medicine

## 2023-07-28 ENCOUNTER — Encounter: Payer: Self-pay | Admitting: Physician Assistant

## 2023-07-28 ENCOUNTER — Inpatient Hospital Stay

## 2023-07-28 VITALS — BP 117/78 | HR 99 | Temp 98.1°F | Resp 17

## 2023-07-28 DIAGNOSIS — C7931 Secondary malignant neoplasm of brain: Secondary | ICD-10-CM | POA: Diagnosis not present

## 2023-07-28 DIAGNOSIS — Z95828 Presence of other vascular implants and grafts: Secondary | ICD-10-CM

## 2023-07-28 MED ORDER — FILGRASTIM-SNDZ 480 MCG/0.8ML IJ SOSY
480.0000 ug | PREFILLED_SYRINGE | Freq: Once | INTRAMUSCULAR | Status: AC
  Administered 2023-07-28: 480 ug via SUBCUTANEOUS
  Filled 2023-07-28: qty 0.8

## 2023-07-29 ENCOUNTER — Inpatient Hospital Stay

## 2023-07-29 VITALS — BP 112/72 | HR 116 | Temp 98.3°F | Resp 18

## 2023-07-29 DIAGNOSIS — Z95828 Presence of other vascular implants and grafts: Secondary | ICD-10-CM

## 2023-07-29 DIAGNOSIS — C7931 Secondary malignant neoplasm of brain: Secondary | ICD-10-CM | POA: Diagnosis not present

## 2023-07-29 MED ORDER — FILGRASTIM-SNDZ 480 MCG/0.8ML IJ SOSY
480.0000 ug | PREFILLED_SYRINGE | Freq: Once | INTRAMUSCULAR | Status: AC
Start: 2023-07-29 — End: 2023-07-29
  Administered 2023-07-29: 480 ug via SUBCUTANEOUS
  Filled 2023-07-29: qty 0.8

## 2023-08-02 ENCOUNTER — Other Ambulatory Visit: Payer: Self-pay | Admitting: Physician Assistant

## 2023-08-02 ENCOUNTER — Other Ambulatory Visit: Payer: Self-pay

## 2023-08-02 ENCOUNTER — Telehealth: Payer: Self-pay

## 2023-08-02 ENCOUNTER — Inpatient Hospital Stay: Payer: Managed Care, Other (non HMO)

## 2023-08-02 DIAGNOSIS — C3491 Malignant neoplasm of unspecified part of right bronchus or lung: Secondary | ICD-10-CM

## 2023-08-02 DIAGNOSIS — C7931 Secondary malignant neoplasm of brain: Secondary | ICD-10-CM | POA: Diagnosis not present

## 2023-08-02 DIAGNOSIS — D6481 Anemia due to antineoplastic chemotherapy: Secondary | ICD-10-CM

## 2023-08-02 DIAGNOSIS — Z95828 Presence of other vascular implants and grafts: Secondary | ICD-10-CM

## 2023-08-02 DIAGNOSIS — D649 Anemia, unspecified: Secondary | ICD-10-CM

## 2023-08-02 LAB — CBC WITH DIFFERENTIAL (CANCER CENTER ONLY)
Abs Immature Granulocytes: 0.75 10*3/uL — ABNORMAL HIGH (ref 0.00–0.07)
Basophils Absolute: 0 10*3/uL (ref 0.0–0.1)
Basophils Relative: 0 %
Eosinophils Absolute: 0 10*3/uL (ref 0.0–0.5)
Eosinophils Relative: 0 %
HCT: 23.3 % — ABNORMAL LOW (ref 39.0–52.0)
Hemoglobin: 7.7 g/dL — ABNORMAL LOW (ref 13.0–17.0)
Immature Granulocytes: 12 %
Lymphocytes Relative: 7 %
Lymphs Abs: 0.5 10*3/uL — ABNORMAL LOW (ref 0.7–4.0)
MCH: 29.7 pg (ref 26.0–34.0)
MCHC: 33 g/dL (ref 30.0–36.0)
MCV: 90 fL (ref 80.0–100.0)
Monocytes Absolute: 1.1 10*3/uL — ABNORMAL HIGH (ref 0.1–1.0)
Monocytes Relative: 18 %
Neutro Abs: 3.8 10*3/uL (ref 1.7–7.7)
Neutrophils Relative %: 63 %
Platelet Count: 101 10*3/uL — ABNORMAL LOW (ref 150–400)
RBC: 2.59 MIL/uL — ABNORMAL LOW (ref 4.22–5.81)
RDW: 16.1 % — ABNORMAL HIGH (ref 11.5–15.5)
WBC Count: 6.2 10*3/uL (ref 4.0–10.5)
nRBC: 0.5 % — ABNORMAL HIGH (ref 0.0–0.2)

## 2023-08-02 LAB — CMP (CANCER CENTER ONLY)
ALT: 12 U/L (ref 0–44)
AST: 14 U/L — ABNORMAL LOW (ref 15–41)
Albumin: 3.6 g/dL (ref 3.5–5.0)
Alkaline Phosphatase: 66 U/L (ref 38–126)
Anion gap: 9 (ref 5–15)
BUN: 6 mg/dL — ABNORMAL LOW (ref 8–23)
CO2: 25 mmol/L (ref 22–32)
Calcium: 8.3 mg/dL — ABNORMAL LOW (ref 8.9–10.3)
Chloride: 100 mmol/L (ref 98–111)
Creatinine: 0.95 mg/dL (ref 0.61–1.24)
GFR, Estimated: >60 mL/min (ref >60)
Glucose, Bld: 118 mg/dL — ABNORMAL HIGH (ref 70–99)
Potassium: 3.8 mmol/L (ref 3.5–5.1)
Sodium: 134 mmol/L — ABNORMAL LOW (ref 135–145)
Total Bilirubin: 0.6 mg/dL (ref 0.0–1.2)
Total Protein: 6.6 g/dL (ref 6.5–8.1)

## 2023-08-02 MED ORDER — SODIUM CHLORIDE 0.9% FLUSH
10.0000 mL | INTRAVENOUS | Status: DC | PRN
  Administered 2023-08-02: 10 mL

## 2023-08-02 MED ORDER — HEPARIN SOD (PORK) LOCK FLUSH 100 UNIT/ML IV SOLN
500.0000 [IU] | Freq: Once | INTRAVENOUS | Status: AC | PRN
  Administered 2023-08-02: 500 [IU]

## 2023-08-02 NOTE — Progress Notes (Signed)
error 

## 2023-08-02 NOTE — Telephone Encounter (Signed)
 Spoke with patient in regards to lab results. Per providers- patients hgb 7.7 and needs 1 unit of blood. Patient is being scheduled 08/03/2023 for port flush with lab at 1045 and infusion at 1145.  Patient verbalizes understanding.

## 2023-08-03 ENCOUNTER — Ambulatory Visit: Payer: Managed Care, Other (non HMO) | Admitting: Physician Assistant

## 2023-08-03 ENCOUNTER — Ambulatory Visit: Payer: Managed Care, Other (non HMO)

## 2023-08-03 ENCOUNTER — Inpatient Hospital Stay

## 2023-08-03 ENCOUNTER — Other Ambulatory Visit: Payer: Self-pay

## 2023-08-03 ENCOUNTER — Other Ambulatory Visit: Payer: Managed Care, Other (non HMO)

## 2023-08-03 VITALS — BP 126/77 | HR 93 | Temp 98.1°F | Resp 24

## 2023-08-03 DIAGNOSIS — Z95828 Presence of other vascular implants and grafts: Secondary | ICD-10-CM

## 2023-08-03 DIAGNOSIS — T451X5A Adverse effect of antineoplastic and immunosuppressive drugs, initial encounter: Secondary | ICD-10-CM

## 2023-08-03 DIAGNOSIS — C7931 Secondary malignant neoplasm of brain: Secondary | ICD-10-CM | POA: Diagnosis not present

## 2023-08-03 DIAGNOSIS — D649 Anemia, unspecified: Secondary | ICD-10-CM

## 2023-08-03 DIAGNOSIS — C3491 Malignant neoplasm of unspecified part of right bronchus or lung: Secondary | ICD-10-CM

## 2023-08-03 LAB — CBC WITH DIFFERENTIAL (CANCER CENTER ONLY)
Abs Immature Granulocytes: 0.78 10*3/uL — ABNORMAL HIGH (ref 0.00–0.07)
Basophils Absolute: 0 10*3/uL (ref 0.0–0.1)
Basophils Relative: 1 %
Eosinophils Absolute: 0 10*3/uL (ref 0.0–0.5)
Eosinophils Relative: 0 %
HCT: 23 % — ABNORMAL LOW (ref 39.0–52.0)
Hemoglobin: 7.6 g/dL — ABNORMAL LOW (ref 13.0–17.0)
Immature Granulocytes: 12 %
Lymphocytes Relative: 9 %
Lymphs Abs: 0.6 10*3/uL — ABNORMAL LOW (ref 0.7–4.0)
MCH: 29.7 pg (ref 26.0–34.0)
MCHC: 33 g/dL (ref 30.0–36.0)
MCV: 89.8 fL (ref 80.0–100.0)
Monocytes Absolute: 0.9 10*3/uL (ref 0.1–1.0)
Monocytes Relative: 14 %
Neutro Abs: 4.1 10*3/uL (ref 1.7–7.7)
Neutrophils Relative %: 64 %
Platelet Count: 146 10*3/uL — ABNORMAL LOW (ref 150–400)
RBC: 2.56 MIL/uL — ABNORMAL LOW (ref 4.22–5.81)
RDW: 16.5 % — ABNORMAL HIGH (ref 11.5–15.5)
Smear Review: NORMAL
WBC Count: 6.4 10*3/uL (ref 4.0–10.5)
nRBC: 0.5 % — ABNORMAL HIGH (ref 0.0–0.2)

## 2023-08-03 LAB — CMP (CANCER CENTER ONLY)
ALT: 11 U/L (ref 0–44)
AST: 14 U/L — ABNORMAL LOW (ref 15–41)
Albumin: 3.5 g/dL (ref 3.5–5.0)
Alkaline Phosphatase: 64 U/L (ref 38–126)
Anion gap: 7 (ref 5–15)
BUN: 6 mg/dL — ABNORMAL LOW (ref 8–23)
CO2: 26 mmol/L (ref 22–32)
Calcium: 8.1 mg/dL — ABNORMAL LOW (ref 8.9–10.3)
Chloride: 101 mmol/L (ref 98–111)
Creatinine: 0.91 mg/dL (ref 0.61–1.24)
GFR, Estimated: >60 mL/min (ref >60)
Glucose, Bld: 99 mg/dL (ref 70–99)
Potassium: 3.7 mmol/L (ref 3.5–5.1)
Sodium: 134 mmol/L — ABNORMAL LOW (ref 135–145)
Total Bilirubin: 0.5 mg/dL (ref 0.0–1.2)
Total Protein: 6.6 g/dL (ref 6.5–8.1)

## 2023-08-03 LAB — PREPARE RBC (CROSSMATCH)

## 2023-08-03 LAB — ABO/RH: ABO/RH(D): O POS

## 2023-08-03 MED ORDER — SODIUM CHLORIDE 0.9% FLUSH
10.0000 mL | INTRAVENOUS | Status: DC | PRN
  Administered 2023-08-03: 10 mL

## 2023-08-03 MED ORDER — HEPARIN SOD (PORK) LOCK FLUSH 100 UNIT/ML IV SOLN
500.0000 [IU] | Freq: Once | INTRAVENOUS | Status: AC | PRN
  Administered 2023-08-03: 500 [IU]

## 2023-08-03 MED ORDER — DIPHENHYDRAMINE HCL 25 MG PO CAPS
25.0000 mg | ORAL_CAPSULE | Freq: Once | ORAL | Status: AC
  Administered 2023-08-03: 25 mg via ORAL
  Filled 2023-08-03: qty 1

## 2023-08-03 MED ORDER — SODIUM CHLORIDE 0.9% IV SOLUTION
250.0000 mL | INTRAVENOUS | Status: DC
  Administered 2023-08-03: 100 mL via INTRAVENOUS

## 2023-08-03 MED ORDER — ACETAMINOPHEN 325 MG PO TABS
650.0000 mg | ORAL_TABLET | Freq: Once | ORAL | Status: AC
  Administered 2023-08-03: 650 mg via ORAL
  Filled 2023-08-03: qty 2

## 2023-08-04 LAB — BPAM RBC
Blood Product Expiration Date: 202505102359
ISSUE DATE / TIME: 202504101433
Unit Type and Rh: 9500

## 2023-08-04 LAB — TYPE AND SCREEN
ABO/RH(D): O POS
Antibody Screen: POSITIVE
Donor AG Type: NEGATIVE
PT AG Type: NEGATIVE
Unit division: 0

## 2023-08-08 MED FILL — Fosaprepitant Dimeglumine For IV Infusion 150 MG (Base Eq): INTRAVENOUS | Qty: 5 | Status: AC

## 2023-08-09 ENCOUNTER — Inpatient Hospital Stay: Payer: Managed Care, Other (non HMO)

## 2023-08-09 ENCOUNTER — Inpatient Hospital Stay (HOSPITAL_BASED_OUTPATIENT_CLINIC_OR_DEPARTMENT_OTHER): Payer: Managed Care, Other (non HMO) | Admitting: Internal Medicine

## 2023-08-09 VITALS — BP 113/84 | HR 103 | Temp 97.9°F | Resp 18 | Ht 68.0 in | Wt 182.2 lb

## 2023-08-09 DIAGNOSIS — C3491 Malignant neoplasm of unspecified part of right bronchus or lung: Secondary | ICD-10-CM

## 2023-08-09 DIAGNOSIS — C7931 Secondary malignant neoplasm of brain: Secondary | ICD-10-CM | POA: Diagnosis not present

## 2023-08-09 DIAGNOSIS — C3490 Malignant neoplasm of unspecified part of unspecified bronchus or lung: Secondary | ICD-10-CM | POA: Diagnosis not present

## 2023-08-09 DIAGNOSIS — Z95828 Presence of other vascular implants and grafts: Secondary | ICD-10-CM

## 2023-08-09 LAB — CBC WITH DIFFERENTIAL (CANCER CENTER ONLY)
Abs Immature Granulocytes: 0.13 10*3/uL — ABNORMAL HIGH (ref 0.00–0.07)
Basophils Absolute: 0 10*3/uL (ref 0.0–0.1)
Basophils Relative: 0 %
Eosinophils Absolute: 0 10*3/uL (ref 0.0–0.5)
Eosinophils Relative: 0 %
HCT: 27.1 % — ABNORMAL LOW (ref 39.0–52.0)
Hemoglobin: 8.8 g/dL — ABNORMAL LOW (ref 13.0–17.0)
Immature Granulocytes: 1 %
Lymphocytes Relative: 4 %
Lymphs Abs: 0.4 10*3/uL — ABNORMAL LOW (ref 0.7–4.0)
MCH: 29.8 pg (ref 26.0–34.0)
MCHC: 32.5 g/dL (ref 30.0–36.0)
MCV: 91.9 fL (ref 80.0–100.0)
Monocytes Absolute: 0.8 10*3/uL (ref 0.1–1.0)
Monocytes Relative: 8 %
Neutro Abs: 8.5 10*3/uL — ABNORMAL HIGH (ref 1.7–7.7)
Neutrophils Relative %: 87 %
Platelet Count: 409 10*3/uL — ABNORMAL HIGH (ref 150–400)
RBC: 2.95 MIL/uL — ABNORMAL LOW (ref 4.22–5.81)
RDW: 17.5 % — ABNORMAL HIGH (ref 11.5–15.5)
WBC Count: 9.8 10*3/uL (ref 4.0–10.5)
nRBC: 0 % (ref 0.0–0.2)

## 2023-08-09 LAB — CMP (CANCER CENTER ONLY)
ALT: 10 U/L (ref 0–44)
AST: 17 U/L (ref 15–41)
Albumin: 3.4 g/dL — ABNORMAL LOW (ref 3.5–5.0)
Alkaline Phosphatase: 62 U/L (ref 38–126)
Anion gap: 8 (ref 5–15)
BUN: 7 mg/dL — ABNORMAL LOW (ref 8–23)
CO2: 25 mmol/L (ref 22–32)
Calcium: 8.4 mg/dL — ABNORMAL LOW (ref 8.9–10.3)
Chloride: 102 mmol/L (ref 98–111)
Creatinine: 0.89 mg/dL (ref 0.61–1.24)
GFR, Estimated: >60 mL/min (ref >60)
Glucose, Bld: 166 mg/dL — ABNORMAL HIGH (ref 70–99)
Potassium: 3.8 mmol/L (ref 3.5–5.1)
Sodium: 135 mmol/L (ref 135–145)
Total Bilirubin: 0.4 mg/dL (ref 0.0–1.2)
Total Protein: 6.4 g/dL — ABNORMAL LOW (ref 6.5–8.1)

## 2023-08-09 LAB — SAMPLE TO BLOOD BANK

## 2023-08-09 LAB — TSH: TSH: 1.894 u[IU]/mL (ref 0.350–4.500)

## 2023-08-09 MED ORDER — SODIUM CHLORIDE 0.9% FLUSH
10.0000 mL | INTRAVENOUS | Status: DC | PRN
  Administered 2023-08-09: 10 mL

## 2023-08-09 MED ORDER — SODIUM CHLORIDE 0.9 % IV SOLN
150.0000 mg | Freq: Once | INTRAVENOUS | Status: AC
  Administered 2023-08-09: 150 mg via INTRAVENOUS
  Filled 2023-08-09: qty 150

## 2023-08-09 MED ORDER — SODIUM CHLORIDE 0.9 % IV SOLN
INTRAVENOUS | Status: DC
Start: 2023-08-09 — End: 2023-08-09

## 2023-08-09 MED ORDER — PALONOSETRON HCL INJECTION 0.25 MG/5ML
0.2500 mg | Freq: Once | INTRAVENOUS | Status: AC
  Administered 2023-08-09: 0.25 mg via INTRAVENOUS
  Filled 2023-08-09: qty 5

## 2023-08-09 MED ORDER — HEPARIN SOD (PORK) LOCK FLUSH 100 UNIT/ML IV SOLN
500.0000 [IU] | Freq: Once | INTRAVENOUS | Status: AC | PRN
  Administered 2023-08-09: 500 [IU]

## 2023-08-09 MED ORDER — CYANOCOBALAMIN 1000 MCG/ML IJ SOLN
1000.0000 ug | Freq: Once | INTRAMUSCULAR | Status: AC
  Administered 2023-08-09: 1000 ug via INTRAMUSCULAR
  Filled 2023-08-09: qty 1

## 2023-08-09 MED ORDER — DEXAMETHASONE SODIUM PHOSPHATE 10 MG/ML IJ SOLN
10.0000 mg | Freq: Once | INTRAMUSCULAR | Status: AC
  Administered 2023-08-09: 10 mg via INTRAVENOUS
  Filled 2023-08-09: qty 1

## 2023-08-09 MED ORDER — SODIUM CHLORIDE 0.9 % IV SOLN
500.0000 mg/m2 | Freq: Once | INTRAVENOUS | Status: AC
  Administered 2023-08-09: 1000 mg via INTRAVENOUS
  Filled 2023-08-09: qty 40

## 2023-08-09 MED ORDER — CARBOPLATIN CHEMO INJECTION 600 MG/60ML
595.5000 mg | Freq: Once | INTRAVENOUS | Status: AC
  Administered 2023-08-09: 600 mg via INTRAVENOUS
  Filled 2023-08-09: qty 60

## 2023-08-09 MED ORDER — PEMBROLIZUMAB CHEMO INJECTION 100 MG/4ML
200.0000 mg | Freq: Once | INTRAVENOUS | Status: AC
  Administered 2023-08-09: 200 mg via INTRAVENOUS
  Filled 2023-08-09: qty 200

## 2023-08-09 NOTE — Patient Instructions (Signed)
 [facility] - A DEPT OF [facility]  Discharge Instructions: Thank you for choosing [facility] to provide your oncology and hematology care.   If you have a lab appointment with the Cancer Center, please go directly to the Cancer Center and check in at the registration area.   Wear comfortable clothing and clothing appropriate for easy access to any Portacath or PICC line.   We strive to give you quality time with your provider. You may need to reschedule your appointment if you arrive late (15 or more minutes).  Arriving late affects you and other patients whose appointments are after yours.  Also, if you miss three or more appointments without notifying the office, you may be dismissed from the clinic at the provider's discretion.      For prescription refill requests, have your pharmacy contact our office and allow 72 hours for refills to be completed.    Today you received the following chemotherapy and/or immunotherapy agents :  Pembrolizumab, Pemetrexed, & Carboplatin      To help prevent nausea and vomiting after your treatment, we encourage you to take your nausea medication as directed.  BELOW ARE SYMPTOMS THAT SHOULD BE REPORTED IMMEDIATELY: *FEVER GREATER THAN 100.4 F (38 C) OR HIGHER *CHILLS OR SWEATING *NAUSEA AND VOMITING THAT IS NOT CONTROLLED WITH YOUR NAUSEA MEDICATION *UNUSUAL SHORTNESS OF BREATH *UNUSUAL BRUISING OR BLEEDING *URINARY PROBLEMS (pain or burning when urinating, or frequent urination) *BOWEL PROBLEMS (unusual diarrhea, constipation, pain near the anus) TENDERNESS IN MOUTH AND THROAT WITH OR WITHOUT PRESENCE OF ULCERS (sore throat, sores in mouth, or a toothache) UNUSUAL RASH, SWELLING OR PAIN  UNUSUAL VAGINAL DISCHARGE OR ITCHING   Items with * indicate a potential emergency and should be followed up as soon as possible or go to the Emergency Department if any problems should occur.  Please show the  CHEMOTHERAPY ALERT CARD or IMMUNOTHERAPY ALERT CARD at check-in to the Emergency Department and triage nurse.  Should you have questions after your visit or need to cancel or reschedule your appointment, please contact [facility] - A DEPT [facility]  Dept: [phone]  and follow the prompts.  Office hours are 8:00 a.m. to 4:30 p.m. Monday - Friday. Please note that voicemails left after 4:00 p.m. may not be returned until the following business day.  We are closed weekends and major holidays. You have access to a nurse at all times for urgent questions. Please call the main number to the clinic Dept: [phone] and follow the prompts.   For any non-urgent questions, you may also contact your provider using MyChart. We now offer e-Visits for anyone 68 and older to request care online for non-urgent symptoms. For details visit [email or web address].   Also download the MyChart app! Go to the app store, search "MyChart", open the app, select [place], and log in with your MyChart username and password.

## 2023-08-09 NOTE — Progress Notes (Signed)
 [facility] [phone]   [phone]  OFFICE PROGRESS NOTE  [name], NP [address] [name] [address] [place] [address]  DIAGNOSIS: Stage IV (T3, N0, M1C) non-small cell lung cancer, adenocarcinoma.  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 He was diagnosed in July 2021.   Molecular Biomarkers:  MSI-High DETECTED Pembrolizumab Atezolizumab, Avelumab, Cemiplimab, Dostarlimab, Durvalumab, Ipilimumab, Nivolumab   STK11Splice Site SNV 1.9% Everolimus, Temsirolimus Yes   [id number] 1.7% Binimetinib Yes   [id number] 0.4%   Niraparib, Olaparib, Rucaparib, Talazoparib, Tazemetostat Yes   PRIOR THERAPY:  1) SRS to the solitary brain metastasis under the care of Dr. [name]. Last treatment 11/14/19. 2) Weekly concurrent chemoradiation with carboplatin for an AUC of 2, paclitaxel 45 mg/m2.  First dose expected on 11/25/2019. Status post 7 cycles, last dose was given 01/06/2020 with partial response.  3)  Immunotherapy with Keytruda 200 mg IV every 3 weeks.  First dose February 10, 2020 for a patient with MSI high.  Status post 35  cycles. 4) Avastin 15 mg/KG every 3 weeks.  First dose today for the vasogenic edema of the brain.S/P 7 cycles. 5) The patient had evidence for local disease recurrence in July 2024. 9) Resuming his treatment with Keytruda 200 Mg IV every 3 weeks. First dose 11/16/2022. Status post 10 cycles  10) SRS to the new metastatic brain lesions under the care of Dr. [name], completed on 06/21/2023   CURRENT THERAPY:  Palliative systemic chemotherapy with carboplatin for an AUC of 5, alimta 500 mg/m, and Keytruda. First dose on 06/28/23. He is status post 2 cycles.   INTERVAL HISTORY: Joseph Atkins 63 y.o. male returns to the clinic today for follow-up visit accompanied by his wife. Discussed the use of AI scribe software for clinical note  transcription with the patient, who gave verbal consent to proceed.  History of Present Illness   Joseph Atkins is a 63 year old male with stage four non-small cell lung cancer who presents for evaluation before starting cycle three of combined chemo-immunotherapy.  He was diagnosed with stage four non-small cell lung cancer, adenocarcinoma, in July 2021. His cancer is MSI high. Initially, he underwent stereotactic radiosurgery for a solitary brain metastasis and received concurrent chemotherapy. This was followed by immunotherapy with Keytruda, which he completed two years ago. After disease progression, he resumed Keytruda for ten cycles. Recently, systemic chemotherapy with carboplatin and Alimta was added to his treatment regimen with Keytruda. He has completed two cycles of this combined treatment and is tolerating it well.  He receives injections to boost his white blood count and platelets due to low counts. He experiences significant leg pain when walking, which he attributes to the injections. He takes Claritin and Tylenol to manage these symptoms. He also experiences flu-like symptoms, which he associates with the white blood cell boosting injections. His hemoglobin was low, necessitating additional treatment, which he refers to as 'the red bug'.  He notes that he was 'sick as a dog' last month until receiving treatment for his anemia. He has lost some weight, which he mentions has made his bed more comfortable.       MEDICAL HISTORY: Past Medical History:  Diagnosis Date   Cancer [facility])    lung cancer   Diverticulosis    GERD (gastroesophageal reflux disease)    Hx of small bowel obstruction  Hyperlipidemia    Hypertension    Hypothyroidism    IBS (irritable bowel syndrome)    Substance abuse (HCC)    Alcoholic, Drug addition   Thyroid disease     ALLERGIES:  is allergic to penicillins.  MEDICATIONS:  Current Outpatient Medications  Medication Sig Dispense Refill    albuterol (PROVENTIL HFA;VENTOLIN HFA) 108 (90 Base) MCG/ACT inhaler Inhale 2 puffs into the lungs every 6 (six) hours as needed for wheezing or shortness of breath. 1 Inhaler 0   amLODipine (NORVASC) 5 MG tablet TAKE 1 TABLET (5 MG TOTAL) BY MOUTH DAILY. 90 tablet 1   ANORO ELLIPTA 62.5-25 MCG/INH AEPB 1 puff daily.     augmented betamethasone dipropionate (DIPROLENE-AF) 0.05 % cream Apply topically 2 (two) times daily.     azithromycin (ZITHROMAX Z-PAK) 250 MG tablet Take as directed 6 each 0   benzonatate (TESSALON) 100 MG capsule Take 1 capsule (100 mg total) by mouth 3 (three) times daily as needed. 30 capsule 2   [facility] ASPIRIN LOW DOSE 81 MG tablet TAKE 1 TABLET (81 MG TOTAL) BY MOUTH DAILY. SWALLOW WHOLE. 90 tablet 3   cyclobenzaprine (FLEXERIL) 10 MG tablet Take 10 mg by mouth 2 (two) times daily as needed for muscle spasms.     dexamethasone (DECADRON) 1 MG tablet Take 1 tablet (1 mg total) by mouth daily with breakfast. (Patient not taking: Reported on 07/17/2023) 60 tablet 1   docusate sodium (COLACE) 100 MG capsule Take 100 mg by mouth daily.     DULoxetine (CYMBALTA) 20 MG capsule Take 1 capsule (20 mg total) by mouth 2 (two) times daily. 60 capsule 5   fluticasone furoate-vilanterol (BREO ELLIPTA) 100-25 MCG/INH AEPB Inhale 1 puff into the lungs daily. 30 each 3   folic acid (FOLVITE) 1 MG tablet TAKE 1 TABLET BY MOUTH EVERY DAY 90 tablet 0   HYDROcodone bit-homatropine (HYCODAN) 5-1.5 MG/5ML syrup Take 5 mLs by mouth every 6 (six) hours as needed for cough. 473 mL 0   hydrOXYzine (ATARAX) 25 MG tablet Take 25 mg by mouth at bedtime.     levothyroxine (SYNTHROID) 100 MCG tablet TAKE 1 TABLET BY MOUTH EVERY DAY 90 tablet 1   lidocaine-prilocaine (EMLA) cream Apply 1 application topically as needed. 30 g 0   lumateperone tosylate (CAPLYTA) 10.5 MG capsule Take 42 mg by mouth daily. For sleep     omeprazole (PRILOSEC) 40 MG capsule Take 1 capsule (40 mg total) by mouth daily. 90 capsule 4    ondansetron (ZOFRAN) 4 MG tablet Take 1 tablet (4 mg total) by mouth every 8 (eight) hours as needed. 40 tablet 2   oxyCODONE-acetaminophen (PERCOCET/ROXICET) 5-325 MG tablet Take 1 tablet by mouth every 4 (four) hours as needed for severe pain. 30 tablet 0   pembrolizumab (KEYTRUDA) 100 MG/4ML SOLN See admin instructions.     polyethylene glycol powder (MIRALAX) powder Take 17 g by mouth daily. 255 g 11   prochlorperazine (COMPAZINE) 10 MG tablet Take 1 tablet (10 mg total) by mouth every 6 (six) hours as needed. 30 tablet 2   temazepam (RESTORIL) 30 MG capsule Take 1 capsule (30 mg total) by mouth at bedtime as needed for sleep. 30 capsule 0   triamcinolone cream (KENALOG) 0.1 % APPLY TOPICALLY 2 TIMES DAILY AS NEEDED. 454 g 0   TRULANCE 3 MG TABS Take 1 tablet by mouth daily.     No current facility-administered medications for this visit.   Facility-Administered Medications Ordered in Other  Visits  Medication Dose Route Frequency Provider Last Rate Last Admin   sodium chloride flush (NS) 0.9 % injection 10 mL  10 mL Intracatheter PRN Marlene Simas, MD   10 mL at 08/09/23 5784    SURGICAL HISTORY:  Past Surgical History:  Procedure Laterality Date   arm surgery Right    BRONCHIAL BRUSHINGS  10/24/2019   Procedure: BRONCHIAL BRUSHINGS;  Surgeon: Denson Flake, MD;  Location: [facility] ENDOSCOPY;  Service: Cardiopulmonary;;  right lower lobe    BRONCHIAL BRUSHINGS  11/05/2019   Procedure: BRONCHIAL BRUSHINGS;  Surgeon: Denson Flake, MD;  Location: [facility] ENDOSCOPY;  Service: Pulmonary;;   BRONCHIAL NEEDLE ASPIRATION BIOPSY  10/24/2019   Procedure: BRONCHIAL NEEDLE ASPIRATION BIOPSIES;  Surgeon: Denson Flake, MD;  Location: [facility] ENDOSCOPY;  Service: Cardiopulmonary;;   BRONCHIAL NEEDLE ASPIRATION BIOPSY  11/05/2019   Procedure: BRONCHIAL NEEDLE ASPIRATION BIOPSIES;  Surgeon: Denson Flake, MD;  Location: [facility] ENDOSCOPY;  Service: Pulmonary;;   ENDOBRONCHIAL ULTRASOUND N/A 10/24/2019    Procedure: ENDOBRONCHIAL ULTRASOUND;  Surgeon: Denson Flake, MD;  Location: [facility] ENDOSCOPY;  Service: Cardiopulmonary;  Laterality: N/A;   FINGER SURGERY Right    Middle   IR IMAGING GUIDED PORT INSERTION  11/19/2019   VIDEO BRONCHOSCOPY N/A 10/24/2019   Procedure: VIDEO BRONCHOSCOPY WITHOUT FLUORO;  Surgeon: Denson Flake, MD;  Location: [facility] ENDOSCOPY;  Service: Cardiopulmonary;  Laterality: N/A;   VIDEO BRONCHOSCOPY WITH ENDOBRONCHIAL NAVIGATION N/A 11/05/2019   Procedure: VIDEO BRONCHOSCOPY WITH ENDOBRONCHIAL NAVIGATION;  Surgeon: Denson Flake, MD;  Location: [facility] ENDOSCOPY;  Service: Pulmonary;  Laterality: N/A;    REVIEW OF SYSTEMS:  Constitutional: positive for fatigue Eyes: negative Ears, nose, mouth, throat, and face: negative Respiratory: positive for cough and dyspnea on exertion Cardiovascular: negative Gastrointestinal: negative Genitourinary:negative Integument/breast: negative Hematologic/lymphatic: negative Musculoskeletal:positive for arthralgias and muscle weakness Neurological: negative Behavioral/Psych: negative Endocrine: negative Allergic/Immunologic: negative   PHYSICAL EXAMINATION: General appearance: alert, cooperative, fatigued, and no distress Head: Normocephalic, without obvious abnormality, atraumatic Neck: no adenopathy, no JVD, supple, symmetrical, trachea midline, and thyroid not enlarged, symmetric, no tenderness/mass/nodules Lymph nodes: Cervical, supraclavicular, and axillary nodes normal. Resp: clear to auscultation bilaterally Back: symmetric, no curvature. ROM normal. No CVA tenderness. Cardio: regular rate and rhythm, S1, S2 normal, no murmur, click, rub or gallop GI: soft, non-tender; bowel sounds normal; no masses,  no organomegaly Extremities: extremities normal, atraumatic, no cyanosis or edema Neurologic: Alert and oriented X 3, normal strength and tone. Normal symmetric reflexes. Normal coordination and gait  ECOG PERFORMANCE STATUS: 1 -  Symptomatic but completely ambulatory  Blood pressure 113/84, pulse (!) 103, temperature 97.9 F (36.6 C), temperature source Temporal, resp. rate 18, height 5\' 8"  (1.727 m), weight 182 lb 3.2 oz (82.6 kg), SpO2 99%.  LABORATORY DATA: Lab Results  Component Value Date   WBC 6.4 08/03/2023   HGB 7.6 (L) 08/03/2023   HCT 23.0 (L) 08/03/2023   MCV 89.8 08/03/2023   PLT 146 (L) 08/03/2023      Chemistry      Component Value Date/Time   NA 134 (L) 08/03/2023 1109   NA 141 03/08/2017 1707   K 3.7 08/03/2023 1109   CL 101 08/03/2023 1109   CO2 26 08/03/2023 1109   BUN 6 (L) 08/03/2023 1109   BUN 12 03/08/2017 1707   CREATININE 0.91 08/03/2023 1109      Component Value Date/Time   CALCIUM 8.1 (L) 08/03/2023 1109   ALKPHOS 64 08/03/2023 1109   AST 14 (L)  08/03/2023 1109   ALT 11 08/03/2023 1109   BILITOT 0.5 08/03/2023 1109       RADIOGRAPHIC STUDIES: DG Chest 2 View Result Date: 07/19/2023 CLINICAL DATA:  Cough, lung cancer EXAM: CHEST - 2 VIEW COMPARISON:  06/09/2023 FINDINGS: Right chest port remains in place. Normal heart size. Small-moderate right pleural effusion, increased. Patchy right basilar airspace consolidation. Known right lower lobe mass not well assessed radiographically. Left lung is clear. No pneumothorax. IMPRESSION: Small-moderate right pleural effusion with patchy right basilar airspace consolidation. Electronically Signed   By: Leverne Reading D.O.   On: 07/19/2023 14:01     ASSESSMENT AND PLAN: This is a very pleasant 63 years old white male with stage IV (T3, N0, M1c) non-small cell lung cancer, adenocarcinoma with MSI high presented with right lower lobe/infrahilar mass in addition to solitary brain metastasis in the left cerebellum diagnosed in July 2021. He is status post SRS to the solitary brain metastasis. The patient completed a course of concurrent chemoradiation with weekly carboplatin and paclitaxel.  He tolerated the treatment well except for  fatigue and mild odynophagia. The patient has MSI high and I recommended for him treatment with immunotherapy with single agent Keytruda 200 mg IV every 3 weeks for a total of 2 years unless the patient has unacceptable toxicity or disease progression. He is status post 35  cycles of treatment with Keytruda.  He also received 7 cycles of Avastin for the vasogenic edema in the brain.   Avastin will be on hold for now unless needed in the future. The patient tolerated his previous treatment with immunotherapy fairly well. The patient has been on observation and he is feeling fine with no concerning complaints except for the mild cough and wheezing. His restaging scan in June 2024 showed no concerning findings for disease progression except for interval increase in the adjacent postobstructive airspace disease close to the treated mass of the infra right lower lobe concerning for local disease recurrence.  He had a PET scan performed on 11/04/2022.  I personally and independently reviewed the scan images before the final report became available.  It showed increased radiotracer uptake associated with the masslike architectural distortion within the central aspect of the right lower lobe concerning for local disease recurrence.  But there was no evidence for nodal metastasis or distant metastatic disease. I discussed the result with the patient and his wife.  The patient received curative radiotherapy to this area in the past and he may not be a great candidate for reirradiation.  He has MSI high and I recommended for him to consider resuming his treatment with Keytruda again at 200 Mg IV every 3 weeks.  He was also given the option of close monitoring and observation. The patient decided to resume his treatment with Keytruda 200 Mg IV every 3 weeks.  Status post 10 cycles. He had evidence for disease progression and we added systemic chemotherapy with reduced dose carboplatin and Alimta to his current treatment  with Keytruda.  He is status post 2 cycles of the new regimen.    Stage IV non-small cell lung cancer, adenocarcinoma Stage IV non-small cell lung cancer, adenocarcinoma, diagnosed in July 2021 with MSI high. Status post SRS to a solitary brain metastasis and completed concurrent chemotherapy. Previously treated with Keytruda for two years before disease progression. Currently on Keytruda with systemic chemotherapy (carboplatin and Alimta), completed two cycles. Tolerating treatment well, reports leg pain, and required injections for leukopenia and anemia. Leg pain likely  due to bone marrow stimulation from white blood cell booster injections. Planned scan of chest, abdomen, and pelvis to assess response to combined chemo-immunotherapy. - Administer cycle 3 of combined chemo-immunotherapy with Keytruda, carboplatin, and Alimta. - Monitor blood counts and administer red blood cells or white blood cell booster injections as needed. - Order a scan of the chest, abdomen, and pelvis 7-10 days before the next visit to assess response to treatment.  Anemia Low hemoglobin levels requiring red blood cell transfusions, likely related to ongoing chemotherapy. - Monitor hemoglobin levels and administer red blood cell transfusions as needed.  Leukopenia Low white blood cell counts necessitating booster injections, a common chemotherapy side effect. Injections can cause bone pain and flu-like symptoms due to bone marrow stimulation. - Administer white blood cell booster injections as needed to maintain adequate white blood cell counts.  Leg pain Significant leg pain, particularly when walking, likely associated with white blood cell booster injections causing bone pain due to bone marrow stimulation. - Continue current pain management with Claritin and Tylenol.   The patient was advised to call immediately if he has any other concerning symptoms in the interval. The patient voices understanding of current  disease status and treatment options and is in agreement with the current care plan.  All questions were answered. The patient knows to call the clinic with any problems, questions or concerns. We can certainly see the patient much sooner if necessary. The total time spent in the appointment was 30 minutes.  Disclaimer: This note was dictated with voice recognition software. Similar sounding words can inadvertently be transcribed and may not be corrected upon review.

## 2023-08-10 ENCOUNTER — Other Ambulatory Visit: Payer: Self-pay | Admitting: Physician Assistant

## 2023-08-10 ENCOUNTER — Other Ambulatory Visit: Payer: Self-pay

## 2023-08-10 LAB — T4: T4, Total: 8.8 ug/dL (ref 4.5–12.0)

## 2023-08-12 ENCOUNTER — Encounter: Payer: Self-pay | Admitting: Internal Medicine

## 2023-08-12 ENCOUNTER — Encounter: Payer: Self-pay | Admitting: Physician Assistant

## 2023-08-16 ENCOUNTER — Encounter (HOSPITAL_COMMUNITY): Payer: Self-pay

## 2023-08-16 ENCOUNTER — Ambulatory Visit (HOSPITAL_COMMUNITY)
Admission: RE | Admit: 2023-08-16 | Discharge: 2023-08-16 | Disposition: A | Discharge status: Home / Self Care | Source: Ambulatory Visit | Attending: Internal Medicine | Admitting: Internal Medicine

## 2023-08-16 ENCOUNTER — Inpatient Hospital Stay: Payer: Managed Care, Other (non HMO)

## 2023-08-16 DIAGNOSIS — C3490 Malignant neoplasm of unspecified part of unspecified bronchus or lung: Secondary | ICD-10-CM | POA: Insufficient documentation

## 2023-08-16 DIAGNOSIS — Z95828 Presence of other vascular implants and grafts: Secondary | ICD-10-CM

## 2023-08-16 DIAGNOSIS — C3491 Malignant neoplasm of unspecified part of right bronchus or lung: Secondary | ICD-10-CM

## 2023-08-16 LAB — CBC WITH DIFFERENTIAL (CANCER CENTER ONLY)
Abs Immature Granulocytes: 0.02 10*3/uL (ref 0.00–0.07)
Basophils Absolute: 0 10*3/uL (ref 0.0–0.1)
Basophils Relative: 1 %
Eosinophils Absolute: 0 10*3/uL (ref 0.0–0.5)
Eosinophils Relative: 1 %
HCT: 26 % — ABNORMAL LOW (ref 39.0–52.0)
Hemoglobin: 8.6 g/dL — ABNORMAL LOW (ref 13.0–17.0)
Immature Granulocytes: 1 %
Lymphocytes Relative: 22 %
Lymphs Abs: 0.4 10*3/uL — ABNORMAL LOW (ref 0.7–4.0)
MCH: 29.6 pg (ref 26.0–34.0)
MCHC: 33.1 g/dL (ref 30.0–36.0)
MCV: 89.3 fL (ref 80.0–100.0)
Monocytes Absolute: 0.3 10*3/uL (ref 0.1–1.0)
Monocytes Relative: 14 %
Neutro Abs: 1.1 10*3/uL — ABNORMAL LOW (ref 1.7–7.7)
Neutrophils Relative %: 61 %
Platelet Count: 264 10*3/uL (ref 150–400)
RBC: 2.91 MIL/uL — ABNORMAL LOW (ref 4.22–5.81)
RDW: 16 % — ABNORMAL HIGH (ref 11.5–15.5)
WBC Count: 1.8 10*3/uL — ABNORMAL LOW (ref 4.0–10.5)
nRBC: 0 % (ref 0.0–0.2)

## 2023-08-16 LAB — CMP (CANCER CENTER ONLY)
ALT: 23 U/L (ref 0–44)
AST: 25 U/L (ref 15–41)
Albumin: 3.8 g/dL (ref 3.5–5.0)
Alkaline Phosphatase: 70 U/L (ref 38–126)
Anion gap: 6 (ref 5–15)
BUN: 7 mg/dL — ABNORMAL LOW (ref 8–23)
CO2: 27 mmol/L (ref 22–32)
Calcium: 8.5 mg/dL — ABNORMAL LOW (ref 8.9–10.3)
Chloride: 96 mmol/L — ABNORMAL LOW (ref 98–111)
Creatinine: 0.87 mg/dL (ref 0.61–1.24)
GFR, Estimated: >60 mL/min (ref >60)
Glucose, Bld: 142 mg/dL — ABNORMAL HIGH (ref 70–99)
Potassium: 3.8 mmol/L (ref 3.5–5.1)
Sodium: 129 mmol/L — ABNORMAL LOW (ref 135–145)
Total Bilirubin: 0.5 mg/dL (ref 0.0–1.2)
Total Protein: 7 g/dL (ref 6.5–8.1)

## 2023-08-16 LAB — SAMPLE TO BLOOD BANK

## 2023-08-16 MED ORDER — HEPARIN SOD (PORK) LOCK FLUSH 100 UNIT/ML IV SOLN
INTRAVENOUS | Status: AC
  Filled 2023-08-16: qty 5

## 2023-08-16 MED ORDER — IOHEXOL 300 MG/ML  SOLN
100.0000 mL | Freq: Once | INTRAMUSCULAR | Status: AC | PRN
  Administered 2023-08-16: 100 mL via INTRAVENOUS

## 2023-08-16 MED ORDER — SODIUM CHLORIDE (PF) 0.9 % IJ SOLN
INTRAMUSCULAR | Status: AC
Start: 2023-08-16 — End: ?
  Filled 2023-08-16: qty 50

## 2023-08-16 MED ORDER — SODIUM CHLORIDE 0.9% FLUSH
10.0000 mL | Freq: Once | INTRAVENOUS | Status: AC
  Administered 2023-08-16: 10 mL

## 2023-08-16 MED ORDER — HEPARIN SOD (PORK) LOCK FLUSH 100 UNIT/ML IV SOLN
500.0000 [IU] | Freq: Once | INTRAVENOUS | Status: AC
  Administered 2023-08-16: 500 [IU] via INTRAVENOUS

## 2023-08-23 ENCOUNTER — Telehealth: Payer: Self-pay

## 2023-08-23 ENCOUNTER — Other Ambulatory Visit: Payer: Self-pay

## 2023-08-23 ENCOUNTER — Inpatient Hospital Stay

## 2023-08-23 DIAGNOSIS — Z95828 Presence of other vascular implants and grafts: Secondary | ICD-10-CM

## 2023-08-23 DIAGNOSIS — C3491 Malignant neoplasm of unspecified part of right bronchus or lung: Secondary | ICD-10-CM

## 2023-08-23 DIAGNOSIS — C7931 Secondary malignant neoplasm of brain: Secondary | ICD-10-CM | POA: Diagnosis not present

## 2023-08-23 DIAGNOSIS — D649 Anemia, unspecified: Secondary | ICD-10-CM

## 2023-08-23 LAB — CMP (CANCER CENTER ONLY)
ALT: 14 U/L (ref 0–44)
AST: 17 U/L (ref 15–41)
Albumin: 3.6 g/dL (ref 3.5–5.0)
Alkaline Phosphatase: 67 U/L (ref 38–126)
Anion gap: 8 (ref 5–15)
BUN: 8 mg/dL (ref 8–23)
CO2: 26 mmol/L (ref 22–32)
Calcium: 8.6 mg/dL — ABNORMAL LOW (ref 8.9–10.3)
Chloride: 99 mmol/L (ref 98–111)
Creatinine: 1.02 mg/dL (ref 0.61–1.24)
GFR, Estimated: >60 mL/min (ref >60)
Glucose, Bld: 99 mg/dL (ref 70–99)
Potassium: 3.9 mmol/L (ref 3.5–5.1)
Sodium: 133 mmol/L — ABNORMAL LOW (ref 135–145)
Total Bilirubin: 0.7 mg/dL (ref 0.0–1.2)
Total Protein: 6.7 g/dL (ref 6.5–8.1)

## 2023-08-23 LAB — CBC WITH DIFFERENTIAL (CANCER CENTER ONLY)
Abs Immature Granulocytes: 0.03 10*3/uL (ref 0.00–0.07)
Basophils Absolute: 0 10*3/uL (ref 0.0–0.1)
Basophils Relative: 0 %
Eosinophils Absolute: 0.1 10*3/uL (ref 0.0–0.5)
Eosinophils Relative: 1 %
HCT: 23.1 % — ABNORMAL LOW (ref 39.0–52.0)
Hemoglobin: 7.6 g/dL — ABNORMAL LOW (ref 13.0–17.0)
Immature Granulocytes: 1 %
Lymphocytes Relative: 9 %
Lymphs Abs: 0.4 10*3/uL — ABNORMAL LOW (ref 0.7–4.0)
MCH: 30 pg (ref 26.0–34.0)
MCHC: 32.9 g/dL (ref 30.0–36.0)
MCV: 91.3 fL (ref 80.0–100.0)
Monocytes Absolute: 1 10*3/uL (ref 0.1–1.0)
Monocytes Relative: 21 %
Neutro Abs: 3.2 10*3/uL (ref 1.7–7.7)
Neutrophils Relative %: 68 %
Platelet Count: 155 10*3/uL (ref 150–400)
RBC: 2.53 MIL/uL — ABNORMAL LOW (ref 4.22–5.81)
RDW: 18.3 % — ABNORMAL HIGH (ref 11.5–15.5)
WBC Count: 4.7 10*3/uL (ref 4.0–10.5)
nRBC: 0 % (ref 0.0–0.2)

## 2023-08-23 LAB — SAMPLE TO BLOOD BANK

## 2023-08-23 LAB — PREPARE RBC (CROSSMATCH)

## 2023-08-23 MED ORDER — HEPARIN SOD (PORK) LOCK FLUSH 100 UNIT/ML IV SOLN
500.0000 [IU] | Freq: Once | INTRAVENOUS | Status: AC | PRN
Start: 2023-08-23 — End: 2023-08-23
  Administered 2023-08-23: 500 [IU]

## 2023-08-23 MED ORDER — SODIUM CHLORIDE 0.9% FLUSH
10.0000 mL | INTRAVENOUS | Status: DC | PRN
  Administered 2023-08-23: 10 mL

## 2023-08-23 NOTE — Progress Notes (Signed)
 Spoke with Joseph Atkins in the blood bank and confirmed 1 unit of blood for tomorrow.

## 2023-08-23 NOTE — Telephone Encounter (Signed)
 Spoke with patient in regards to lab results.  Per Cassie, PA- hgb 7.6, needs 1 unit of blood.  Patient scheduled for blood tomorrow at 0830.  Patient reminded to keep blood arm band on.  Patient verbalized understanding.

## 2023-08-23 NOTE — Telephone Encounter (Signed)
 error

## 2023-08-24 ENCOUNTER — Ambulatory Visit: Payer: Managed Care, Other (non HMO)

## 2023-08-24 ENCOUNTER — Ambulatory Visit: Payer: Managed Care, Other (non HMO) | Admitting: Internal Medicine

## 2023-08-24 ENCOUNTER — Other Ambulatory Visit: Payer: Managed Care, Other (non HMO)

## 2023-08-24 ENCOUNTER — Inpatient Hospital Stay: Attending: Internal Medicine

## 2023-08-24 DIAGNOSIS — Z923 Personal history of irradiation: Secondary | ICD-10-CM | POA: Insufficient documentation

## 2023-08-24 DIAGNOSIS — K589 Irritable bowel syndrome without diarrhea: Secondary | ICD-10-CM | POA: Insufficient documentation

## 2023-08-24 DIAGNOSIS — Z79899 Other long term (current) drug therapy: Secondary | ICD-10-CM | POA: Insufficient documentation

## 2023-08-24 DIAGNOSIS — D649 Anemia, unspecified: Secondary | ICD-10-CM | POA: Diagnosis not present

## 2023-08-24 DIAGNOSIS — R6883 Chills (without fever): Secondary | ICD-10-CM | POA: Diagnosis not present

## 2023-08-24 DIAGNOSIS — J432 Centrilobular emphysema: Secondary | ICD-10-CM | POA: Diagnosis not present

## 2023-08-24 DIAGNOSIS — Z79890 Hormone replacement therapy (postmenopausal): Secondary | ICD-10-CM | POA: Insufficient documentation

## 2023-08-24 DIAGNOSIS — G936 Cerebral edema: Secondary | ICD-10-CM | POA: Insufficient documentation

## 2023-08-24 DIAGNOSIS — I3139 Other pericardial effusion (noninflammatory): Secondary | ICD-10-CM | POA: Diagnosis not present

## 2023-08-24 DIAGNOSIS — J189 Pneumonia, unspecified organism: Secondary | ICD-10-CM | POA: Insufficient documentation

## 2023-08-24 DIAGNOSIS — R252 Cramp and spasm: Secondary | ICD-10-CM | POA: Insufficient documentation

## 2023-08-24 DIAGNOSIS — Z5111 Encounter for antineoplastic chemotherapy: Secondary | ICD-10-CM | POA: Insufficient documentation

## 2023-08-24 DIAGNOSIS — R1310 Dysphagia, unspecified: Secondary | ICD-10-CM | POA: Diagnosis not present

## 2023-08-24 DIAGNOSIS — Z7952 Long term (current) use of systemic steroids: Secondary | ICD-10-CM | POA: Diagnosis not present

## 2023-08-24 DIAGNOSIS — K219 Gastro-esophageal reflux disease without esophagitis: Secondary | ICD-10-CM | POA: Diagnosis not present

## 2023-08-24 DIAGNOSIS — T451X5A Adverse effect of antineoplastic and immunosuppressive drugs, initial encounter: Secondary | ICD-10-CM

## 2023-08-24 DIAGNOSIS — E039 Hypothyroidism, unspecified: Secondary | ICD-10-CM | POA: Diagnosis not present

## 2023-08-24 DIAGNOSIS — Z7982 Long term (current) use of aspirin: Secondary | ICD-10-CM | POA: Insufficient documentation

## 2023-08-24 DIAGNOSIS — C3491 Malignant neoplasm of unspecified part of right bronchus or lung: Secondary | ICD-10-CM | POA: Insufficient documentation

## 2023-08-24 DIAGNOSIS — I10 Essential (primary) hypertension: Secondary | ICD-10-CM | POA: Diagnosis not present

## 2023-08-24 DIAGNOSIS — E785 Hyperlipidemia, unspecified: Secondary | ICD-10-CM | POA: Insufficient documentation

## 2023-08-24 DIAGNOSIS — C7931 Secondary malignant neoplasm of brain: Secondary | ICD-10-CM | POA: Diagnosis present

## 2023-08-24 DIAGNOSIS — Z9221 Personal history of antineoplastic chemotherapy: Secondary | ICD-10-CM | POA: Diagnosis not present

## 2023-08-24 DIAGNOSIS — Z801 Family history of malignant neoplasm of trachea, bronchus and lung: Secondary | ICD-10-CM | POA: Insufficient documentation

## 2023-08-24 DIAGNOSIS — I700 Atherosclerosis of aorta: Secondary | ICD-10-CM | POA: Insufficient documentation

## 2023-08-24 DIAGNOSIS — J90 Pleural effusion, not elsewhere classified: Secondary | ICD-10-CM | POA: Diagnosis not present

## 2023-08-24 DIAGNOSIS — Z7951 Long term (current) use of inhaled steroids: Secondary | ICD-10-CM | POA: Insufficient documentation

## 2023-08-24 MED ORDER — DIPHENHYDRAMINE HCL 25 MG PO CAPS
25.0000 mg | ORAL_CAPSULE | Freq: Once | ORAL | Status: AC
  Administered 2023-08-24: 25 mg via ORAL
  Filled 2023-08-24: qty 1

## 2023-08-24 MED ORDER — HEPARIN SOD (PORK) LOCK FLUSH 100 UNIT/ML IV SOLN
500.0000 [IU] | Freq: Every day | INTRAVENOUS | Status: AC | PRN
  Administered 2023-08-24: 500 [IU]

## 2023-08-24 MED ORDER — SODIUM CHLORIDE 0.9% IV SOLUTION
250.0000 mL | Freq: Once | INTRAVENOUS | Status: AC
  Administered 2023-08-24: 100 mL via INTRAVENOUS

## 2023-08-24 MED ORDER — SODIUM CHLORIDE 0.9% FLUSH
10.0000 mL | INTRAVENOUS | Status: AC | PRN
  Administered 2023-08-24: 10 mL

## 2023-08-24 MED ORDER — ACETAMINOPHEN 325 MG PO TABS
650.0000 mg | ORAL_TABLET | Freq: Once | ORAL | Status: AC
  Administered 2023-08-24: 650 mg via ORAL
  Filled 2023-08-24: qty 2

## 2023-08-24 NOTE — Patient Instructions (Signed)

## 2023-08-25 LAB — TYPE AND SCREEN
ABO/RH(D): O POS
Antibody Screen: POSITIVE
Donor AG Type: NEGATIVE
Unit division: 0

## 2023-08-25 LAB — BPAM RBC
Blood Product Expiration Date: 202505282359
ISSUE DATE / TIME: 202505010909
Unit Type and Rh: 5100

## 2023-08-25 NOTE — Progress Notes (Unsigned)
 [facility] OFFICE PROGRESS NOTE  Joseph Quint, NP [address]  [address] [place] [address]  DIAGNOSIS: Stage IV (T3, N0, M1C) non-small cell lung cancer, adenocarcinoma.  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 He was diagnosed in July 2021.   Molecular Biomarkers:  MSI-High DETECTED Pembrolizumab  Atezolizumab, Avelumab, Cemiplimab, Dostarlimab, Durvalumab, Ipilimumab, Nivolumab   STK11Splice Site SNV 1.9% Everolimus, Temsirolimus Yes   KRASG12D 1.7% Binimetinib Yes   [id number] 0.4%   Niraparib, Olaparib, Rucaparib, Talazoparib, Tazemetostat Yes  PRIOR THERAPY: 1) SRS to the solitary brain metastasis under the care of Dr. Jeryl Moris. Last treatment 11/14/19. 2) Weekly concurrent chemoradiation with carboplatin  for an AUC of 2, paclitaxel  45 mg/m2.  First dose expected on 11/25/2019. Status post 7 cycles, last dose was given 01/06/2020 with partial response.  3)  Immunotherapy with Keytruda  200 mg IV every 3 weeks.  First dose February 10, 2020 for a patient with MSI high.  Status post 35  cycles. 4) Avastin  15 mg/KG every 3 weeks.  First dose today for the vasogenic edema of the brain.S/P 7 cycles. 5) The patient had evidence for local disease recurrence in July 2024. 6) SRS to the occipital brain lesion under the care of Dr. Jeryl Moris on 12/22/22 7)  UHRT under the care of Dr. Jeryl Moris, last dose expected on 03/03/23 8) SRS to the new metastatic brain lesion on 03/07/23 9) Resuming his treatment with Keytruda  200 Mg IV every 3 weeks. First dose 11/16/2022. Status post 10 cycles  10) SRS to the new metastatic brain lesions under the care of Dr. Jeryl Moris, completed on 06/21/2023  CURRENT THERAPY: Palliative systemic chemotherapy with carboplatin  for an AUC of 5, alimta 500 mg/m, and Keytruda . First dose on 06/28/23. He is status post 3 cycles.   INTERVAL HISTORY: Joseph Atkins  63 y.o. male returns to the clinic today for follow-up visit accompanied by his wife.  The patient was last seen by Dr. Marguerita Shih 3 weeks ago.  In summary, the patient is found to have evidence of disease progression in the central aspect of the right lower lobe concerning for local recurrence in the Summer of 2024. Therefore, he was resumed on systemic treatment with immunotherapy with Keytruda . He tolerates his Keytruda  well. Unfortunately, the patient has been having a lot of recurrent metastatic disease to the brain. He saw Dr. Mark Sil on 06/06/2023. Unfortunately, the patient had a repeat brain MRI that showed 8 novel metastases. He underwent salvage SRS under the care of Dr. Jeryl Moris. This was completed today on 06/21/2023. Neuro-oncologist, Dr. Mark Sil recommended, discussing systemic therapy options, such as the addition of chemotherapy, in light of ongoing progression in the CNS compartment. He is scheduled for repeat brain MRI later this month.   Therefore, additional chemotherapy was added to his immunotherapy.  He has been having some cytopenias requiring supportive care.  The most recent was a blood transfusion was performed last week. This only minimally improved his energy levels.   He has been experiencing a worsening cough over the past four days, characterized by increased coughing, particularly in the morning, and production of thick mucus that sometimes induces vomiting. He experiences chills and feels cold throughout the day. He has lost about ten pounds over the past three weeks, which he attributes to a decreased appetite and persistent low energy. No fever or recent exposure to sick individuals. No recent radiation to the lungs, with  the last known radiation treatment occurring in November.  He describes significant leg pain that worsens with walking, affecting the back of his legs/calf. No swelling or redness. There is no pain at rest.   He denies any fever or night sweats.  He denies any  chest pain or hemoptysis.  He denies any nausea or diarrhea.  He is managing his constipation and he is having regular bowel movements.  He recently had a restaging CT scan.  He is here today for evaluation and to review his scan results.  He is here for evaluation and repeat blood work before undergoing cycle #4    MEDICAL HISTORY: Past Medical History:  Diagnosis Date   Cancer (HCC)    lung cancer   Diverticulosis    GERD (gastroesophageal reflux disease)    Hx of small bowel obstruction    Hyperlipidemia    Hypertension    Hypothyroidism    IBS (irritable bowel syndrome)    Substance abuse (HCC)    Alcoholic, Drug addition   Thyroid  disease     ALLERGIES:  is allergic to penicillins.  MEDICATIONS:  Current Outpatient Medications  Medication Sig Dispense Refill   albuterol  (PROVENTIL  HFA;VENTOLIN  HFA) 108 (90 Base) MCG/ACT inhaler Inhale 2 puffs into the lungs every 6 (six) hours as needed for wheezing or shortness of breath. 1 Inhaler 0   amLODipine  (NORVASC ) 5 MG tablet TAKE 1 TABLET (5 MG TOTAL) BY MOUTH DAILY. 90 tablet 1   ANORO ELLIPTA 62.5-25 MCG/INH AEPB 1 puff daily.     augmented betamethasone dipropionate (DIPROLENE-AF) 0.05 % cream Apply topically 2 (two) times daily.     azithromycin  (ZITHROMAX  Z-PAK) 250 MG tablet Take as directed 6 each 0   benzonatate  (TESSALON ) 100 MG capsule Take 1 capsule (100 mg total) by mouth 3 (three) times daily as needed. 30 capsule 2   [facility] ASPIRIN  LOW DOSE 81 MG tablet TAKE 1 TABLET (81 MG TOTAL) BY MOUTH DAILY. SWALLOW WHOLE. 90 tablet 3   cyclobenzaprine  (FLEXERIL ) 10 MG tablet Take 10 mg by mouth 2 (two) times daily as needed for muscle spasms.     dexamethasone  (DECADRON ) 1 MG tablet Take 1 tablet (1 mg total) by mouth daily with breakfast. (Patient not taking: Reported on 07/17/2023) 60 tablet 1   docusate sodium  (COLACE) 100 MG capsule Take 100 mg by mouth daily.     DULoxetine  (CYMBALTA ) 20 MG capsule Take 1 capsule (20 mg  total) by mouth 2 (two) times daily. 60 capsule 5   fluticasone  furoate-vilanterol (BREO ELLIPTA ) 100-25 MCG/INH AEPB Inhale 1 puff into the lungs daily. 30 each 3   folic acid  (FOLVITE ) 1 MG tablet TAKE 1 TABLET BY MOUTH EVERY DAY 90 tablet 0   HYDROcodone  bit-homatropine (HYCODAN) 5-1.5 MG/5ML syrup Take 5 mLs by mouth every 6 (six) hours as needed for cough. 473 mL 0   hydrOXYzine (ATARAX) 25 MG tablet Take 25 mg by mouth at bedtime.     levothyroxine  (SYNTHROID ) 100 MCG tablet TAKE 1 TABLET BY MOUTH EVERY DAY 90 tablet 1   lidocaine -prilocaine  (EMLA ) cream Apply 1 application topically as needed. 30 g 0   loratadine (CLARITIN) 10 MG tablet Take 10 mg by mouth daily.     lumateperone tosylate (CAPLYTA) 10.5 MG capsule Take 42 mg by mouth daily. For sleep     omeprazole  (PRILOSEC) 40 MG capsule Take 1 capsule (40 mg total) by mouth daily. 90 capsule 4   ondansetron  (ZOFRAN ) 4 MG tablet Take 1  tablet (4 mg total) by mouth every 8 (eight) hours as needed. 40 tablet 2   oxyCODONE -acetaminophen  (PERCOCET/ROXICET) 5-325 MG tablet Take 1 tablet by mouth every 4 (four) hours as needed for severe pain. 30 tablet 0   pembrolizumab  (KEYTRUDA ) 100 MG/4ML SOLN See admin instructions.     polyethylene glycol powder (MIRALAX ) powder Take 17 g by mouth daily. 255 g 11   prochlorperazine  (COMPAZINE ) 10 MG tablet Take 1 tablet (10 mg total) by mouth every 6 (six) hours as needed. 30 tablet 2   temazepam  (RESTORIL ) 30 MG capsule Take 1 capsule (30 mg total) by mouth at bedtime as needed for sleep. 30 capsule 0   triamcinolone  cream (KENALOG ) 0.1 % APPLY TOPICALLY 2 TIMES DAILY AS NEEDED. 454 g 0   TRULANCE 3 MG TABS Take 1 tablet by mouth daily.     No current facility-administered medications for this visit.    SURGICAL HISTORY:  Past Surgical History:  Procedure Laterality Date   arm surgery Right    BRONCHIAL BRUSHINGS  10/24/2019   Procedure: BRONCHIAL BRUSHINGS;  Surgeon: Denson Flake, MD;   Location: [facility] ENDOSCOPY;  Service: Cardiopulmonary;;  right lower lobe    BRONCHIAL BRUSHINGS  11/05/2019   Procedure: BRONCHIAL BRUSHINGS;  Surgeon: Denson Flake, MD;  Location: [facility] ENDOSCOPY;  Service: Pulmonary;;   BRONCHIAL NEEDLE ASPIRATION BIOPSY  10/24/2019   Procedure: BRONCHIAL NEEDLE ASPIRATION BIOPSIES;  Surgeon: Denson Flake, MD;  Location: [facility] ENDOSCOPY;  Service: Cardiopulmonary;;   BRONCHIAL NEEDLE ASPIRATION BIOPSY  11/05/2019   Procedure: BRONCHIAL NEEDLE ASPIRATION BIOPSIES;  Surgeon: Denson Flake, MD;  Location: [facility] ENDOSCOPY;  Service: Pulmonary;;   ENDOBRONCHIAL ULTRASOUND N/A 10/24/2019   Procedure: ENDOBRONCHIAL ULTRASOUND;  Surgeon: Denson Flake, MD;  Location: [facility] ENDOSCOPY;  Service: Cardiopulmonary;  Laterality: N/A;   FINGER SURGERY Right    Middle   IR IMAGING GUIDED PORT INSERTION  11/19/2019   VIDEO BRONCHOSCOPY N/A 10/24/2019   Procedure: VIDEO BRONCHOSCOPY WITHOUT FLUORO;  Surgeon: Denson Flake, MD;  Location: [facility] ENDOSCOPY;  Service: Cardiopulmonary;  Laterality: N/A;   VIDEO BRONCHOSCOPY WITH ENDOBRONCHIAL NAVIGATION N/A 11/05/2019   Procedure: VIDEO BRONCHOSCOPY WITH ENDOBRONCHIAL NAVIGATION;  Surgeon: Denson Flake, MD;  Location: [facility] ENDOSCOPY;  Service: Pulmonary;  Laterality: N/A;    REVIEW OF SYSTEMS:   Constitutional: Positive for stable fatigue and cold intolerance. Negative for chills and fever.  HENT:  Negative for mouth sores, nosebleeds, sore throat and trouble swallowing.   Eyes:  Negative for eye problems and icterus.  Respiratory: Stable shortness of breath with exertion and increased cough. Negative for hemoptysis and wheezing.   Cardiovascular: Negative for chest pain. Gastrointestinal: Positive for emesis with coughing occasionally. Negative for diarrhea, constipation, nausea and vomiting.  Genitourinary: Negative for bladder incontinence, difficulty urinating, dysuria, frequency and hematuria. Musculoskeletal: Positive for leg pain with  walking. Negative for back pain, gait problem, neck pain and neck stiffness.  Skin: Positive for occasional rashes and itching (none at this time).  Neurological:   Negative for extremity weakness,  light-headedness and seizures.  Hematological: Negative for adenopathy. Does not bruise/bleed easily.  Psychiatric/Behavioral: Negative for confusion, depression and sleep disturbance. The patient is not nervous/anxious.     PHYSICAL EXAMINATION:  There were no vitals taken for this visit.  ECOG PERFORMANCE STATUS: 1  Physical Exam  Constitutional: Oriented to person, place, and time and well-developed, well-nourished, and in no distress. Aaron Aas  HENT:  Head: Normocephalic and atraumatic.  Mouth/Throat: Oropharynx is  clear and moist. No oropharyngeal exudate.  Eyes: Conjunctivae are normal. Right eye exhibits no discharge. Left eye exhibits no discharge. No scleral icterus.  Neck: Normal range of motion. Neck supple.  Cardiovascular: Normal rate, regular rhythm, normal heart sounds and intact distal pulses.   Pulmonary/Chest: Effort normal. Quiet breath sounds bilaterally. No respiratory distress.  No rales.  Abdominal: Soft. Bowel sounds are normal. Exhibits no distension and no mass. There is no tenderness.  Musculoskeletal: Normal range of motion. Exhibits no edema.  Lymphadenopathy:    No cervical adenopathy.  Neurological: Alert and oriented to person, place, and time. Exhibits normal muscle tone. Gait normal. Coordination normal.  Skin: Skin is warm and dry. No rash noted. Not diaphoretic. No erythema. No pallor.  Psychiatric: Mood, memory and judgment normal.  Vitals reviewed.    LABORATORY DATA: Lab Results  Component Value Date   WBC 4.7 08/23/2023   HGB 7.6 (L) 08/23/2023   HCT 23.1 (L) 08/23/2023   MCV 91.3 08/23/2023   PLT 155 08/23/2023      Chemistry      Component Value Date/Time   NA 133 (L) 08/23/2023 0911   NA 141 03/08/2017 1707   K 3.9 08/23/2023 0911   CL 99  08/23/2023 0911   CO2 26 08/23/2023 0911   BUN 8 08/23/2023 0911   BUN 12 03/08/2017 1707   CREATININE 1.02 08/23/2023 0911      Component Value Date/Time   CALCIUM  8.6 (L) 08/23/2023 0911   ALKPHOS 67 08/23/2023 0911   AST 17 08/23/2023 0911   ALT 14 08/23/2023 0911   BILITOT 0.7 08/23/2023 0911       RADIOGRAPHIC STUDIES:  CT ABDOMEN PELVIS W CONTRAST Result Date: 08/23/2023 EXAMINATION: CT ABDOMEN PELVIS W CONTRAST CLINICAL INDICATION: Male, 63 years old. Non-small cell lung cancer (NSCLC), staging TECHNIQUE: Axial CT of the abdomen and pelvis with 100 cc Omnipaque  300 intravenous contrast. Multiplanar reformations provided. Unless otherwise specified, incidental thyroid , adrenal, renal lesions do not require dedicated imaging follow up. Additionally, any mentioned pulmonary nodules do not require dedicated imaging follow-up based on the Fleischner guidelines unless otherwise specified. Coronary calcifications are not identified unless otherwise specified. COMPARISON: 06/09/2023 FINDINGS: Regarding findings of the lung bases, please refer to the chest report. The liver appears normal. The gallbladder is normal. The spleen is normal. Pancreas is normal. The adrenals are normal. The kidneys are normal other than a small left renal cyst. Abdominal aorta is normal in caliber. Scattered aphthous chronic changes are present. The bladder is normal. The prostate is normal. The appendix is normal. Large and small bowel loops are otherwise within normal limits. No free fluid or pathologic lymphadenopathy by size criteria. No concerning osseous lesions are identified. There are degenerative changes of the spine and bony pelvis. IMPRESSION: No evidence for metastatic disease within the abdomen or pelvis. DOSE REDUCTION: This exam was performed according to our departmental dose-optimization program which includes automated exposure control, adjustment of the mA and/or kV according to patient size and/or use  of iterative reconstruction technique. Electronically signed by: [place] Engel MD 08/23/2023 03:51 PM EDT RP Workstation: [id number]     ASSESSMENT/PLAN:  This is a very pleasant 63 year old Caucasian male diagnosed with stage IV non-small cell lung cancer, adenocarcinoma.  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 He was diagnosed in July 2021. His molecular studies by [employer] show he has MSI high which is a  good marker for response to immunotherapy which will be important for future treatment.     The patient will complete SRS to the solidary brain metastasis under the care of Dr. Jeryl Moris later today on 11/14/19.    He then underwent a course of weekly concurrent chemoradiation with carboplatin  for an AUC of 2 and paclitaxel  45 mg per metered squared.  He tolerated this treatment well except for fatigue and mild odynophagia.   The patient showed evidence of disease progression with an increase in volume of the right infrahilar mass.  Since the patient has MSI high, the patient then underwent treatment with single agent immunotherapy with Keytruda  200 mg IV every 3 weeks.   The patient is status post 35 cycles and completed his 2 years of treatment on 03/23/22.  He also received 7 cycles of Avastin  for vasogenic edema in the brain.  Avastin  has been on hold unless needed again in the future.     He has SRS to an occipital brain lesion on 12/22/22 by Dr. Jeryl Moris    He had evidence of progression on his surveillance imaging from July 2024. Therefore, he resumed immunotherapy with Keytruda . He is status post 9 cycles.    He underwent UHRT in the last day was on 03/03/2023 for some disease progression that was seen at that time. He is also was found to have new metastatic disease to the brain and had SRS on 03/07/23.    Unfortunately, the patient recently had continued CNS progression.  He is undergoing SRS under the care  of Dr. Jeryl Moris in the last day was earlier today on 06/21/2023.    In light of the progression in the brain, Dr. Marguerita Shih and the patient are in agreement to try to add on systemic chemotherapy with carboplatin  for an AUC of 5, Alimta 500 mg/m, and Keytruda  200 mg IV every 3 weeks to see if it helps with his progression in the brain.  Status post 3 cycles.  He has had some cytopenias requiring supportive care.  The patient was seen with Dr. Marguerita Shih today.  Dr. Marguerita Shih personally and independently reviewed the scan and discussed results with the patient today.  The scan showed overall decrease in the infrahilar medial right lower lung mass and new complete obstruction of the right lower lobe bronchus which is causing right lower lobe atelectasis and concern for postobstructive pneumonia.  There was also some multifocal new groundglass and subsolid nodules throughout the upper and lower lobes which are new which can be infectious and inflammatory..  Dr. Marguerita Shih recommends delaying treatment by 1 week and undergoing a course of antibiotics for possible pneumonia.  Will also try and arrange for a lower extremity Doppler ultrasound to ensure no DVT.  If negative then we may need to consider claudication and if he needs to see a vascular provider.  We will see the patient back next week for labs and follow-up visit before undergoing cycle #4 which would be his last cycle of treatment containing 2 chemotherapy agents and 1 immunotherapy agent.  Starting from cycle #5 he would be expected to start maintenance treatment which is 1 immunotherapy and 1 chemotherapy drug which hopefully will have less cytopenias requiring supportive care.  He does not need any G-CSF injections or blood transfusion at this time.  We will consider him for a blood transfusion if his hemoglobin were less than 8.  He was encouraged to drink protein supplemental drinks.  Scan shows some pleural effusion although I do  not feel it is  significant enough to be causing his symptoms and likely would not improve his breathing or cough but we will keep monitoring it and we can consider draining it if the quantity of fluid increases.  See him back for follow-up visit in 1 week for evaluation repeat blood work before undergoing cycle #4  The patient was advised to call immediately if he has any concerning symptoms in the interval. The patient voices understanding of current disease status and treatment options and is in agreement with the current care plan. All questions were answered. The patient knows to call the clinic with any problems, questions or concerns. We can certainly see the patient much sooner if necessary   No orders of the defined types were placed in this encounter.   Ramelo Oetken L Shephanie Romas, PA-C 08/25/23  ADDENDUM: Hematology/Oncology Attending: I had a face-to-face encounter with the patient today.  I reviewed his records, lab, scan and recommended his care plan.  This is a very pleasant 63 years old white male with a stage IV non-small cell lung cancer, adenocarcinoma with brain metastasis diagnosed in July 2021.  Molecular studies showed MSI high but no other actionable mutations.  The patient was treated with SRS to solitary brain metastasis initially in July 2021 followed by frequent SRS to other metastatic brain lesion after that.  He started treatment initially with a course of concurrent chemoradiation with weekly carboplatin  and paclitaxel  followed by immunotherapy with Keytruda  for 2 years.  His treatment has been on hold after the 2 years of treatment until he had disease progression again and he resumed his treatment with single agent Keytruda  on July 2024 status post 10 cycles but he continues to have evidence for disease progression in the brain and we added systemic chemotherapy with carboplatin  and Alimta to his current treatment with Keytruda  status post 3 cycles.  He has been tolerating the  treatment well except for increasing fatigue and weakness recently.  He also has chest congestion with cough productive of thick mucus with occasional vomiting.  He also have decrease of appetite and lack of energy. He had repeat CT scan of the chest, abdomen and pelvis performed recently.  I personally independently reviewed the scan images and discussed the result with the patient and his wife.  PET scan showed slight decrease in the size of the infrahilar medial right lower lobe mass but there was new complete obstruction of the right lower lobe bronchus resulting in increased right lower lobe atelectasis raising suspicious for superimposed postobstructive pneumonia.  There was also interval multifocal new groundglass and subsolid nodules throughout the bilateral upper and left lower lobes with new irregular patchy consolidation involving the medial left lower lobe likely infectious/inflammatory.  The scan also showed increased moderate right pleural effusion. I recommended for the patient to delay the start of cycle #4 of his treatment by at least 1 week.  During this week we will start him on a course of antibiotics with doxycycline  100 mg p.o. twice daily. We will see the patient back for follow-up visit next week and if he is feeling better we will resume his treatment. He was advised to call immediately if he has any other concerning symptoms in the interval. The total time spent in the appointment was 30 minutes. Disclaimer: This note was dictated with voice recognition software. Similar sounding words can inadvertently be transcribed and may be missed upon review. Aurelio Blower, MD

## 2023-08-26 ENCOUNTER — Other Ambulatory Visit: Payer: Self-pay

## 2023-08-27 ENCOUNTER — Encounter: Payer: Self-pay | Admitting: Internal Medicine

## 2023-08-27 ENCOUNTER — Encounter: Payer: Self-pay | Admitting: Physician Assistant

## 2023-08-29 MED FILL — Fosaprepitant Dimeglumine For IV Infusion 150 MG (Base Eq): INTRAVENOUS | Qty: 5 | Status: AC

## 2023-08-30 ENCOUNTER — Inpatient Hospital Stay: Payer: Managed Care, Other (non HMO)

## 2023-08-30 ENCOUNTER — Inpatient Hospital Stay (HOSPITAL_BASED_OUTPATIENT_CLINIC_OR_DEPARTMENT_OTHER): Payer: Managed Care, Other (non HMO) | Admitting: Physician Assistant

## 2023-08-30 VITALS — BP 116/73 | HR 118 | Temp 96.7°F | Resp 18 | Wt 173.8 lb

## 2023-08-30 DIAGNOSIS — J189 Pneumonia, unspecified organism: Secondary | ICD-10-CM | POA: Diagnosis not present

## 2023-08-30 DIAGNOSIS — C3491 Malignant neoplasm of unspecified part of right bronchus or lung: Secondary | ICD-10-CM

## 2023-08-30 DIAGNOSIS — Z95828 Presence of other vascular implants and grafts: Secondary | ICD-10-CM

## 2023-08-30 DIAGNOSIS — C7931 Secondary malignant neoplasm of brain: Secondary | ICD-10-CM | POA: Diagnosis not present

## 2023-08-30 DIAGNOSIS — M79606 Pain in leg, unspecified: Secondary | ICD-10-CM

## 2023-08-30 LAB — CMP (CANCER CENTER ONLY)
ALT: 12 U/L (ref 0–44)
AST: 19 U/L (ref 15–41)
Albumin: 3.6 g/dL (ref 3.5–5.0)
Alkaline Phosphatase: 68 U/L (ref 38–126)
Anion gap: 10 (ref 5–15)
BUN: 8 mg/dL (ref 8–23)
CO2: 25 mmol/L (ref 22–32)
Calcium: 8.6 mg/dL — ABNORMAL LOW (ref 8.9–10.3)
Chloride: 96 mmol/L — ABNORMAL LOW (ref 98–111)
Creatinine: 0.98 mg/dL (ref 0.61–1.24)
GFR, Estimated: >60 mL/min (ref >60)
Glucose, Bld: 130 mg/dL — ABNORMAL HIGH (ref 70–99)
Potassium: 3.6 mmol/L (ref 3.5–5.1)
Sodium: 131 mmol/L — ABNORMAL LOW (ref 135–145)
Total Bilirubin: 0.8 mg/dL (ref 0.0–1.2)
Total Protein: 6.9 g/dL (ref 6.5–8.1)

## 2023-08-30 LAB — CBC WITH DIFFERENTIAL (CANCER CENTER ONLY)
Abs Immature Granulocytes: 0.11 10*3/uL — ABNORMAL HIGH (ref 0.00–0.07)
Basophils Absolute: 0 10*3/uL (ref 0.0–0.1)
Basophils Relative: 0 %
Eosinophils Absolute: 0 10*3/uL (ref 0.0–0.5)
Eosinophils Relative: 1 %
HCT: 29.9 % — ABNORMAL LOW (ref 39.0–52.0)
Hemoglobin: 10 g/dL — ABNORMAL LOW (ref 13.0–17.0)
Immature Granulocytes: 1 %
Lymphocytes Relative: 6 %
Lymphs Abs: 0.5 10*3/uL — ABNORMAL LOW (ref 0.7–4.0)
MCH: 30.4 pg (ref 26.0–34.0)
MCHC: 33.4 g/dL (ref 30.0–36.0)
MCV: 90.9 fL (ref 80.0–100.0)
Monocytes Absolute: 1.1 10*3/uL — ABNORMAL HIGH (ref 0.1–1.0)
Monocytes Relative: 13 %
Neutro Abs: 6.5 10*3/uL (ref 1.7–7.7)
Neutrophils Relative %: 79 %
Platelet Count: 312 10*3/uL (ref 150–400)
RBC: 3.29 MIL/uL — ABNORMAL LOW (ref 4.22–5.81)
RDW: 17.5 % — ABNORMAL HIGH (ref 11.5–15.5)
WBC Count: 8.2 10*3/uL (ref 4.0–10.5)
nRBC: 0 % (ref 0.0–0.2)

## 2023-08-30 LAB — SAMPLE TO BLOOD BANK

## 2023-08-30 MED ORDER — DOXYCYCLINE HYCLATE 100 MG PO TABS: 100.0000 mg | ORAL_TABLET | Freq: Two times a day (BID) | ORAL | 0 refills | Status: DC

## 2023-08-30 MED ORDER — SODIUM CHLORIDE 0.9% FLUSH
10.0000 mL | INTRAVENOUS | Status: DC | PRN
  Administered 2023-08-30: 10 mL

## 2023-08-31 ENCOUNTER — Other Ambulatory Visit: Payer: Self-pay

## 2023-09-01 NOTE — Progress Notes (Deleted)
 [facility] OFFICE PROGRESS NOTE  [name], NP [address]  [address] [place] [address]  DIAGNOSIS: Stage IV (T3, N0, M1C) non-small cell lung cancer, adenocarcinoma.  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 He was diagnosed in July 2021.   Molecular Biomarkers:  MSI-High DETECTED Pembrolizumab  Atezolizumab, Avelumab, Cemiplimab, Dostarlimab, Durvalumab, Ipilimumab, Nivolumab   STK11Splice Site SNV 1.9% Everolimus, Temsirolimus Yes   [id number] 1.7% Binimetinib Yes   [id number] 0.4%   Niraparib, Olaparib, Rucaparib, Talazoparib, Tazemetostat Yes    PRIOR THERAPY: 1) SRS to the solitary brain metastasis under the care of Dr. Jeryl Moris. Last treatment 11/14/19. 2) Weekly concurrent chemoradiation with carboplatin  for an AUC of 2, paclitaxel  45 mg/m2.  First dose expected on 11/25/2019. Status post 7 cycles, last dose was given 01/06/2020 with partial response.  3)  Immunotherapy with Keytruda  200 mg IV every 3 weeks.  First dose February 10, 2020 for a patient with MSI high.  Status post 35  cycles. 4) Avastin  15 mg/KG every 3 weeks.  First dose today for the vasogenic edema of the brain.S/P 7 cycles. 5) The patient had evidence for local disease recurrence in July 2024. 6) SRS to the occipital brain lesion under the care of Dr. Jeryl Moris on 12/22/22 7)  UHRT under the care of Dr. Jeryl Moris, last dose expected on 03/03/23 8) SRS to the new metastatic brain lesion on 03/07/23 9) Resuming his treatment with Keytruda  200 Mg IV every 3 weeks. First dose 11/16/2022. Status post 10 cycles  10) SRS to the new metastatic brain lesions under the care of Dr. Jeryl Moris, completed on 06/21/2023    CURRENT THERAPY: Palliative systemic chemotherapy with carboplatin  for an AUC of 5, alimta 500 mg/m, and Keytruda . First dose on 06/28/23. He is status post 3 cycles.   INTERVAL HISTORY: Joseph Atkins 63 y.o. male returns to clinic today for follow-up visit.  The patient was last seen in the clinic on 08/30/23.  He had a restaging CT scan at that time that showed a possible pneumonia.  He was treated with antibiotics.  He is feeling ***at this time.  In summary regarding his condition,   In summary, the patient is found to have evidence of disease progression in the central aspect of the right lower lobe concerning for local recurrence in the Summer of 2024. Therefore, he was resumed on systemic treatment with immunotherapy with Keytruda . He tolerates his Keytruda  well. Unfortunately, the patient has been having a lot of recurrent metastatic disease to the brain. He saw Dr. Mark Sil on 06/06/2023. Unfortunately, the patient had a repeat brain MRI that showed 8 novel metastases. He underwent salvage SRS under the care of Dr. Jeryl Moris. This was completed today on 06/21/2023. Neuro-oncologist, Dr. Mark Sil recommended, discussing systemic therapy options, such as the addition of chemotherapy, in light of ongoing progression in the CNS compartment. He is scheduled for repeat brain MRI later this month.   Therefore, additional chemotherapy was added to his immunotherapy.  He has been having some cytopenias requiring supportive care.  He recently received a blood transfusion weeks ago.  Since he was seen last week he is feeling***  Cough has***. and production of thick mucus that sometimes induces vomiting. He experiences chills and feels cold throughout the day. He has lost about ten pounds over the past three weeks, which he attributes to a decreased appetite and persistent low energy. No  fever or recent exposure to sick individuals. No recent radiation to the lungs, with the last known radiation treatment occurring in November.   He describes significant leg pain that worsens with walking, affecting the back of his legs/calf. No swelling or redness. There is no pain at rest.    He denies any fever or  night sweats.  He denies any chest pain or hemoptysis.  He denies any nausea or diarrhea.  He is managing his constipation and he is having regular bowel movements. He is here for evaluation and repeat blood work before undergoing cycle #4          MEDICAL HISTORY: Past Medical History:  Diagnosis Date   Cancer (HCC)    lung cancer   Diverticulosis    GERD (gastroesophageal reflux disease)    Hx of small bowel obstruction    Hyperlipidemia    Hypertension    Hypothyroidism    IBS (irritable bowel syndrome)    Substance abuse (HCC)    Alcoholic, Drug addition   Thyroid  disease     ALLERGIES:  is allergic to penicillins.  MEDICATIONS:  Current Outpatient Medications  Medication Sig Dispense Refill   albuterol  (PROVENTIL  HFA;VENTOLIN  HFA) 108 (90 Base) MCG/ACT inhaler Inhale 2 puffs into the lungs every 6 (six) hours as needed for wheezing or shortness of breath. 1 Inhaler 0   amLODipine  (NORVASC ) 5 MG tablet TAKE 1 TABLET (5 MG TOTAL) BY MOUTH DAILY. 90 tablet 1   ANORO ELLIPTA 62.5-25 MCG/INH AEPB 1 puff daily.     augmented betamethasone dipropionate (DIPROLENE-AF) 0.05 % cream Apply topically 2 (two) times daily.     azithromycin  (ZITHROMAX  Z-PAK) 250 MG tablet Take as directed 6 each 0   benzonatate  (TESSALON ) 100 MG capsule Take 1 capsule (100 mg total) by mouth 3 (three) times daily as needed. 30 capsule 2   [facility] ASPIRIN  LOW DOSE 81 MG tablet TAKE 1 TABLET (81 MG TOTAL) BY MOUTH DAILY. SWALLOW WHOLE. 90 tablet 3   cyclobenzaprine  (FLEXERIL ) 10 MG tablet Take 10 mg by mouth 2 (two) times daily as needed for muscle spasms.     dexamethasone  (DECADRON ) 1 MG tablet Take 1 tablet (1 mg total) by mouth daily with breakfast. (Patient not taking: Reported on 07/17/2023) 60 tablet 1   docusate sodium  (COLACE) 100 MG capsule Take 100 mg by mouth daily.     doxycycline  (VIBRA -TABS) 100 MG tablet Take 1 tablet (100 mg total) by mouth 2 (two) times daily. 14 tablet 0   DULoxetine   (CYMBALTA ) 20 MG capsule Take 1 capsule (20 mg total) by mouth 2 (two) times daily. 60 capsule 5   fluticasone  furoate-vilanterol (BREO ELLIPTA ) 100-25 MCG/INH AEPB Inhale 1 puff into the lungs daily. 30 each 3   folic acid  (FOLVITE ) 1 MG tablet TAKE 1 TABLET BY MOUTH EVERY DAY 90 tablet 0   HYDROcodone  bit-homatropine (HYCODAN) 5-1.5 MG/5ML syrup Take 5 mLs by mouth every 6 (six) hours as needed for cough. 473 mL 0   hydrOXYzine (ATARAX) 25 MG tablet Take 25 mg by mouth at bedtime.     levothyroxine  (SYNTHROID ) 100 MCG tablet TAKE 1 TABLET BY MOUTH EVERY DAY 90 tablet 1   lidocaine -prilocaine  (EMLA ) cream Apply 1 application topically as needed. 30 g 0   loratadine (CLARITIN) 10 MG tablet Take 10 mg by mouth daily.     lumateperone tosylate (CAPLYTA) 10.5 MG capsule Take 42 mg by mouth daily. For sleep     omeprazole  (PRILOSEC) 40 MG  capsule Take 1 capsule (40 mg total) by mouth daily. 90 capsule 4   ondansetron  (ZOFRAN ) 4 MG tablet Take 1 tablet (4 mg total) by mouth every 8 (eight) hours as needed. 40 tablet 2   oxyCODONE -acetaminophen  (PERCOCET/ROXICET) 5-325 MG tablet Take 1 tablet by mouth every 4 (four) hours as needed for severe pain. 30 tablet 0   pembrolizumab  (KEYTRUDA ) 100 MG/4ML SOLN See admin instructions.     polyethylene glycol powder (MIRALAX ) powder Take 17 g by mouth daily. 255 g 11   prochlorperazine  (COMPAZINE ) 10 MG tablet Take 1 tablet (10 mg total) by mouth every 6 (six) hours as needed. 30 tablet 2   temazepam  (RESTORIL ) 30 MG capsule Take 1 capsule (30 mg total) by mouth at bedtime as needed for sleep. 30 capsule 0   triamcinolone  cream (KENALOG ) 0.1 % APPLY TOPICALLY 2 TIMES DAILY AS NEEDED. 454 g 0   TRULANCE 3 MG TABS Take 1 tablet by mouth daily.     No current facility-administered medications for this visit.    SURGICAL HISTORY:  Past Surgical History:  Procedure Laterality Date   arm surgery Right    BRONCHIAL BRUSHINGS  10/24/2019   Procedure: BRONCHIAL  BRUSHINGS;  Surgeon: Denson Flake, MD;  Location: [facility] ENDOSCOPY;  Service: Cardiopulmonary;;  right lower lobe    BRONCHIAL BRUSHINGS  11/05/2019   Procedure: BRONCHIAL BRUSHINGS;  Surgeon: Denson Flake, MD;  Location: [facility] ENDOSCOPY;  Service: Pulmonary;;   BRONCHIAL NEEDLE ASPIRATION BIOPSY  10/24/2019   Procedure: BRONCHIAL NEEDLE ASPIRATION BIOPSIES;  Surgeon: Denson Flake, MD;  Location: [facility] ENDOSCOPY;  Service: Cardiopulmonary;;   BRONCHIAL NEEDLE ASPIRATION BIOPSY  11/05/2019   Procedure: BRONCHIAL NEEDLE ASPIRATION BIOPSIES;  Surgeon: Denson Flake, MD;  Location: [facility] ENDOSCOPY;  Service: Pulmonary;;   ENDOBRONCHIAL ULTRASOUND N/A 10/24/2019   Procedure: ENDOBRONCHIAL ULTRASOUND;  Surgeon: Denson Flake, MD;  Location: [facility] ENDOSCOPY;  Service: Cardiopulmonary;  Laterality: N/A;   FINGER SURGERY Right    Middle   IR IMAGING GUIDED PORT INSERTION  11/19/2019   VIDEO BRONCHOSCOPY N/A 10/24/2019   Procedure: VIDEO BRONCHOSCOPY WITHOUT FLUORO;  Surgeon: Denson Flake, MD;  Location: [facility] ENDOSCOPY;  Service: Cardiopulmonary;  Laterality: N/A;   VIDEO BRONCHOSCOPY WITH ENDOBRONCHIAL NAVIGATION N/A 11/05/2019   Procedure: VIDEO BRONCHOSCOPY WITH ENDOBRONCHIAL NAVIGATION;  Surgeon: Denson Flake, MD;  Location: [facility] ENDOSCOPY;  Service: Pulmonary;  Laterality: N/A;    REVIEW OF SYSTEMS:   Review of Systems  Constitutional: Negative for appetite change, chills, fatigue, fever and unexpected weight change.  HENT:   Negative for mouth sores, nosebleeds, sore throat and trouble swallowing.   Eyes: Negative for eye problems and icterus.  Respiratory: Negative for cough, hemoptysis, shortness of breath and wheezing.   Cardiovascular: Negative for chest pain and leg swelling.  Gastrointestinal: Negative for abdominal pain, constipation, diarrhea, nausea and vomiting.  Genitourinary: Negative for bladder incontinence, difficulty urinating, dysuria, frequency and hematuria.   Musculoskeletal:  Negative for back pain, gait problem, neck pain and neck stiffness.  Skin: Negative for itching and rash.  Neurological: Negative for dizziness, extremity weakness, gait problem, headaches, light-headedness and seizures.  Hematological: Negative for adenopathy. Does not bruise/bleed easily.  Psychiatric/Behavioral: Negative for confusion, depression and sleep disturbance. The patient is not nervous/anxious.     PHYSICAL EXAMINATION:  There were no vitals taken for this visit.  ECOG PERFORMANCE STATUS: {CHL ONC ECOG [id number]  Physical Exam  Constitutional: Oriented to person, place, and time and well-developed, well-nourished, and  in no distress. No distress.  HENT:  Head: Normocephalic and atraumatic.  Mouth/Throat: Oropharynx is clear and moist. No oropharyngeal exudate.  Eyes: Conjunctivae are normal. Right eye exhibits no discharge. Left eye exhibits no discharge. No scleral icterus.  Neck: Normal range of motion. Neck supple.  Cardiovascular: Normal rate, regular rhythm, normal heart sounds and intact distal pulses.   Pulmonary/Chest: Effort normal and breath sounds normal. No respiratory distress. No wheezes. No rales.  Abdominal: Soft. Bowel sounds are normal. Exhibits no distension and no mass. There is no tenderness.  Musculoskeletal: Normal range of motion. Exhibits no edema.  Lymphadenopathy:    No cervical adenopathy.  Neurological: Alert and oriented to person, place, and time. Exhibits normal muscle tone. Gait normal. Coordination normal.  Skin: Skin is warm and dry. No rash noted. Not diaphoretic. No erythema. No pallor.  Psychiatric: Mood, memory and judgment normal.  Vitals reviewed.  LABORATORY DATA: Lab Results  Component Value Date   WBC 8.2 08/30/2023   HGB 10.0 (L) 08/30/2023   HCT 29.9 (L) 08/30/2023   MCV 90.9 08/30/2023   PLT 312 08/30/2023      Chemistry      Component Value Date/Time   NA 131 (L) 08/30/2023 0905   NA 141 03/08/2017 1707   K  3.6 08/30/2023 0905   CL 96 (L) 08/30/2023 0905   CO2 25 08/30/2023 0905   BUN 8 08/30/2023 0905   BUN 12 03/08/2017 1707   CREATININE 0.98 08/30/2023 0905      Component Value Date/Time   CALCIUM  8.6 (L) 08/30/2023 0905   ALKPHOS 68 08/30/2023 0905   AST 19 08/30/2023 0905   ALT 12 08/30/2023 0905   BILITOT 0.8 08/30/2023 0905       RADIOGRAPHIC STUDIES:  CT Chest W Contrast Result Date: 08/29/2023 CLINICAL DATA:  History of metastatic right lung adenocarcinoma on immunotherapy. Restaging. * Tracking Code: BO * EXAM: CT CHEST WITH CONTRAST TECHNIQUE: Multidetector CT imaging of the chest was performed during intravenous contrast administration. RADIATION DOSE REDUCTION: This exam was performed according to the departmental dose-optimization program which includes automated exposure control, adjustment of the mA and/or kV according to patient size and/or use of iterative reconstruction technique. CONTRAST:  OMNIPAQUE  IOHEXOL  300 MG/ML  SOLN COMPARISON:  CT chest dated 06/09/2023 FINDINGS: Cardiovascular: Right chest wall port terminates at the superior cavoatrial junction. Normal heart size. Small pericardial effusion. Great vessels are normal in course and caliber. No central pulmonary emboli. Coronary artery calcifications and aortic atherosclerosis. Mediastinum/Nodes: Imaged thyroid  gland without nodules meeting criteria for imaging follow-up by size. Normal esophagus. No pathologically enlarged axillary, supraclavicular, mediastinal, or hilar lymph nodes. Lungs/Pleura: Central airways are patent. Interval slight decrease in size of infrahilar medial right lower lobe 4.0 x 3.3 cm mass (2:46), previously 4.2 x 3.7 cm. Similar mild diffuse bronchial wall thickening with new complete obstruction of right lower lobe bronchus (7:99) resulting in increased right lower lobe atelectasis, which demonstrates heterogeneous hypoenhancement. Increased volume loss of the right lobe middle lobe with  increased extension of irregular consolidation and fibrotic changes extending into the posterior right upper lobe and superior segment right lower lobe. Previously noted basilar right upper lobe subsolid nodule is no longer disc greatly seen and inseparable from the presumed post radiation changes. Interval multifocal new ground-glass and subsolid nodules throughout the bilateral upper and left lower lobe with new irregular patchy consolidation involving the medial left lower lobe. Previously noted new left lower lobe pulmonary nodule is no  longer discretely seen. Similar background centrilobular emphysema. No pneumothorax. Increased moderate right pleural effusion. Upper abdomen: Please see separately dictated CT abdomen and pelvis report for detailed findings. Musculoskeletal: No acute or abnormal lytic or blastic osseous lesions. IMPRESSION: 1. Interval slight decrease in size of infrahilar medial right lower lobe mass. 2. New complete obstruction of right lower lobe bronchus resulting in increased right lower lobe atelectasis, which demonstrates heterogeneous hypoenhancement, raising suspicion for superimposed postobstructive pneumonia. 3. Increased volume loss of the right middle lobe with increased extension of irregular consolidation and fibrotic changes into the posterior right upper lobe and superior segment right lower lobe, favored to reflect post radiation changes. 4. Interval multifocal new ground-glass and subsolid nodules throughout the bilateral upper and left lower lobes with new irregular patchy consolidation involving the medial left lower lobe, likely infectious/inflammatory. 5. Increased moderate right pleural effusion. 6. Aortic Atherosclerosis (ICD10-I70.0) and Emphysema (ICD10-J43.9). Coronary artery calcifications. Assessment for potential risk factor modification, dietary therapy or pharmacologic therapy may be warranted, if clinically indicated. Electronically Signed   By: Limin  Xu M.D.    On: 08/29/2023 13:59   CT ABDOMEN PELVIS W CONTRAST Result Date: 08/23/2023 EXAMINATION: CT ABDOMEN PELVIS W CONTRAST CLINICAL INDICATION: Male, 63 years old. Non-small cell lung cancer (NSCLC), staging TECHNIQUE: Axial CT of the abdomen and pelvis with 100 cc Omnipaque  300 intravenous contrast. Multiplanar reformations provided. Unless otherwise specified, incidental thyroid , adrenal, renal lesions do not require dedicated imaging follow up. Additionally, any mentioned pulmonary nodules do not require dedicated imaging follow-up based on the Fleischner guidelines unless otherwise specified. Coronary calcifications are not identified unless otherwise specified. COMPARISON: 06/09/2023 FINDINGS: Regarding findings of the lung bases, please refer to the chest report. The liver appears normal. The gallbladder is normal. The spleen is normal. Pancreas is normal. The adrenals are normal. The kidneys are normal other than a small left renal cyst. Abdominal aorta is normal in caliber. Scattered aphthous chronic changes are present. The bladder is normal. The prostate is normal. The appendix is normal. Large and small bowel loops are otherwise within normal limits. No free fluid or pathologic lymphadenopathy by size criteria. No concerning osseous lesions are identified. There are degenerative changes of the spine and bony pelvis. IMPRESSION: No evidence for metastatic disease within the abdomen or pelvis. DOSE REDUCTION: This exam was performed according to our departmental dose-optimization program which includes automated exposure control, adjustment of the mA and/or kV according to patient size and/or use of iterative reconstruction technique. Electronically signed by: [place] Engel MD 08/23/2023 03:51 PM EDT RP Workstation: [id number]     ASSESSMENT/PLAN:  This is a very pleasant 63 year old Caucasian male diagnosed with stage IV non-small cell lung cancer, adenocarcinoma.  The patient presented with a right lower  lobe/infrahilar mass as well as a solitary brain metastasis in the left cerebellum. He was diagnosed in July 2021. His molecular studies by [employer] show he has MSI high which is a good marker for response to immunotherapy which will be important for future treatment.     The patient will complete SRS to the solidary brain metastasis under the care of Dr. Jeryl Moris later today on 11/14/19.    He then underwent a course of weekly concurrent chemoradiation with carboplatin  for an AUC of 2 and paclitaxel  45 mg per metered squared.  He tolerated this treatment well except for fatigue and mild odynophagia.    The patient showed evidence of disease progression with an increase in volume of the right infrahilar mass.  Since the patient has MSI high, the patient then underwent treatment with single agent immunotherapy with Keytruda  200 mg IV every 3 weeks.   The patient is status post 35 cycles and completed his 2 years of treatment on 03/23/22.  He also received 7 cycles of Avastin  for vasogenic edema in the brain.  Avastin  has been on hold unless needed again in the future.     He has SRS to an occipital brain lesion on 12/22/22 by Dr. Jeryl Moris  He had evidence of progression on his surveillance imaging from July 2024. Therefore, he resumed immunotherapy with Keytruda . He is status post 9 cycles.    He underwent UHRT in the last day was on 03/03/2023 for some disease progression that was seen at that time. He is also was found to have new metastatic disease to the brain and had SRS on 03/07/23.    Unfortunately, the patient recently had continued CNS progression.  He is undergoing SRS under the care of Dr. Jeryl Moris in the last day was earlier today on 06/21/2023.   In light of the progression in the brain, Dr. Marguerita Shih and the patient are in agreement to try to add on systemic chemotherapy with carboplatin  for an AUC of 5, Alimta 500 mg/m, and Keytruda  200 mg IV every 3 weeks to see if it helps with his progression  in the brain.  Status post 3 cycles.  He has had some cytopenias requiring supportive care.   Patient is feeling ***since he was seen last week and received antibiotics.   This is his last cycle of carboplatin .  Starting from his next cycle he will only be on maintenance Alimta and Keytruda  which hopefully will have less cytopenias requiring supportive care.  We will arrange for weekly lab appointments to ensure he does not need any supportive care in the interval.  Labs were reviewed.  He will ***cycle #4 today scheduled.  At his last appointment we arranged for lower extremity Doppler ultrasound to ensure no DVT.  This was scheduled for ***.  Results show ***  He was encouraged to drink protein supplemental drinks.   His last scan showed some effusion.  Will continue to monitor and if this increases in size we may have to consider thoracentesis.  We will see him back for follow-up in 3 weeks for evaluation repeat blood work before undergoing cycle #5.  Would consider him for a blood transfusion if his hemoglobin were less than 8.  We would consider him for G-CSF injections if his ANC is less than 0.5.  The patient was advised to call immediately if he has any concerning symptoms in the interval. The patient voices understanding of current disease status and treatment options and is in agreement with the current care plan. All questions were answered. The patient knows to call the clinic with any problems, questions or concerns. We can certainly see the patient much sooner if necessary         No orders of the defined types were placed in this encounter.    I spent {CHL ONC TIME VISIT - JYNWG:[id number]} counseling the patient face to face. The total time spent in the appointment was {CHL ONC TIME VISIT - HQION:[id number]}.  Maciel Kegg L Hue Steveson, PA-C 09/01/23

## 2023-09-05 ENCOUNTER — Telehealth: Payer: Self-pay

## 2023-09-05 NOTE — Progress Notes (Unsigned)
 [facility] OFFICE PROGRESS NOTE  Joseph Quint, NP [address]  [address] [place] [address]  DIAGNOSIS: Stage IV (T3, N0, M1C) non-small cell lung cancer, adenocarcinoma.  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 He was diagnosed in July 2021.   Molecular Biomarkers:  MSI-High DETECTED Pembrolizumab  Atezolizumab, Avelumab, Cemiplimab, Dostarlimab, Durvalumab, Ipilimumab, Nivolumab   STK11Splice Site SNV 1.9% Everolimus, Temsirolimus Yes   [id number] 1.7% Binimetinib Yes   [id number] 0.4%   Niraparib, Olaparib, Rucaparib, Talazoparib, Tazemetostat Yes    PRIOR THERAPY: 1) SRS to the solitary brain metastasis under the care of Dr. Jeryl Moris. Last treatment 11/14/19. 2) Weekly concurrent chemoradiation with carboplatin  for an AUC of 2, paclitaxel  45 mg/m2.  First dose expected on 11/25/2019. Status post 7 cycles, last dose was given 01/06/2020 with partial response.  3)  Immunotherapy with Keytruda  200 mg IV every 3 weeks.  First dose February 10, 2020 for a patient with MSI high.  Status post 35  cycles. 4) Avastin  15 mg/KG every 3 weeks.  First dose today for the vasogenic edema of the brain.S/P 7 cycles. 5) The patient had evidence for local disease recurrence in July 2024. 6) SRS to the occipital brain lesion under the care of Dr. Jeryl Moris on 12/22/22 7)  UHRT under the care of Dr. Jeryl Moris, last dose expected on 03/03/23 8) SRS to the new metastatic brain lesion on 03/07/23 9) Resuming his treatment with Keytruda  200 Mg IV every 3 weeks. First dose 11/16/2022. Status post 10 cycles  10) SRS to the new metastatic brain lesions under the care of Dr. Jeryl Moris, completed on 06/21/2023    CURRENT THERAPY: Palliative systemic chemotherapy with carboplatin  for an AUC of 5, alimta 500 mg/m, and Keytruda . First dose on 06/28/23. He is status post 3 cycles. His dose of carboplatin  will be  reduced starting from cycle #4 to AUC of 4 and alimta 400 mg/m2 due to intolerance.   INTERVAL HISTORY: Joseph Atkins 63 y.o. male returns to the clinic today for a follow-up visit.  The patient was last seen in the clinic on 08/30/23.  He had a restaging CT scan at that time that showed a possible pneumonia.  He was treated with antibiotics.     In summary, the patient is found to have evidence of disease progression in the central aspect of the right lower lobe concerning for local recurrence in the Summer of 2024. Therefore, he was resumed on systemic treatment with immunotherapy with Keytruda . He tolerates his Keytruda  well. Unfortunately, the patient has been having a lot of recurrent metastatic disease to the brain. He saw Dr. Mark Sil on 06/06/2023. Unfortunately, the patient had a repeat brain MRI that showed 8 novel metastases. He underwent salvage SRS under the care of Dr. Jeryl Moris. This was completed today on 06/21/2023. Neuro-oncologist, Dr. Mark Sil recommended, discussing systemic therapy options, such as the addition of chemotherapy, in light of ongoing progression in the CNS compartment. He is scheduled for repeat brain MRI later this month.   Therefore, additional chemotherapy was added to his immunotherapy.  He has been having some cytopenias requiring supportive care.  He recently received a blood transfusion weeks ago.  He has not felt well since starting the chemotherapy. He is scheduled for treatment on Friday. This is expected to be his last cycle of treatment with carboplatin . Starting from his next cycle 3 weeks later, he is supposed to  start maintenance treatment with Alimta and keytruda , which hopefully will be easier.   He had a bilateral doppler ultrasound this morning which was negative for DVT due to him endorsing some leg pain bilaterally last week.   He has experienced improvement in breathing since losing weight, but continues to produce thick mucus, leading to vomiting,  especially in the mornings. No fever, night sweats, chest discomfort, or hemoptysis. Occasional chills are attributed to air conditioning settings.  He has a decreased appetite and has lost additional weight. No nausea apart from vomiting associated with coughing. No abnormal bleeding or bruising, although he occasionally notices age spots on his arms after scratching.  Recent blood work shows a hemoglobin level of 9.7, slightly lower than the previous week, but he has not required a blood transfusion. Platelet count and infection-fighting cells are within normal limits. Sodium and calcium  levels are slightly low, which may contribute to muscle cramps in his hands. He has previously received injections to manage blood counts during treatment.  He is here for evaluation and repeat blood work before undergoing cycle #4 later this week .   MEDICAL HISTORY: Past Medical History:  Diagnosis Date   Cancer (HCC)    lung cancer   Diverticulosis    GERD (gastroesophageal reflux disease)    Hx of small bowel obstruction    Hyperlipidemia    Hypertension    Hypothyroidism    IBS (irritable bowel syndrome)    Substance abuse (HCC)    Alcoholic, Drug addition   Thyroid  disease     ALLERGIES:  is allergic to penicillins.  MEDICATIONS:  Current Outpatient Medications  Medication Sig Dispense Refill   albuterol  (PROVENTIL  HFA;VENTOLIN  HFA) 108 (90 Base) MCG/ACT inhaler Inhale 2 puffs into the lungs every 6 (six) hours as needed for wheezing or shortness of breath. 1 Inhaler 0   amLODipine  (NORVASC ) 5 MG tablet TAKE 1 TABLET (5 MG TOTAL) BY MOUTH DAILY. 90 tablet 1   ANORO ELLIPTA 62.5-25 MCG/INH AEPB 1 puff daily.     augmented betamethasone dipropionate (DIPROLENE-AF) 0.05 % cream Apply topically 2 (two) times daily.     azithromycin  (ZITHROMAX  Z-PAK) 250 MG tablet Take as directed 6 each 0   benzonatate  (TESSALON ) 100 MG capsule Take 1 capsule (100 mg total) by mouth 3 (three) times daily as  needed. 30 capsule 2   [facility] ASPIRIN  LOW DOSE 81 MG tablet TAKE 1 TABLET (81 MG TOTAL) BY MOUTH DAILY. SWALLOW WHOLE. 90 tablet 3   cyclobenzaprine  (FLEXERIL ) 10 MG tablet Take 10 mg by mouth 2 (two) times daily as needed for muscle spasms.     dexamethasone  (DECADRON ) 1 MG tablet Take 1 tablet (1 mg total) by mouth daily with breakfast. (Patient not taking: Reported on 07/17/2023) 60 tablet 1   docusate sodium  (COLACE) 100 MG capsule Take 100 mg by mouth daily.     doxycycline  (VIBRA -TABS) 100 MG tablet Take 1 tablet (100 mg total) by mouth 2 (two) times daily. 14 tablet 0   DULoxetine  (CYMBALTA ) 20 MG capsule Take 1 capsule (20 mg total) by mouth 2 (two) times daily. 60 capsule 5   fluticasone  furoate-vilanterol (BREO ELLIPTA ) 100-25 MCG/INH AEPB Inhale 1 puff into the lungs daily. 30 each 3   folic acid  (FOLVITE ) 1 MG tablet TAKE 1 TABLET BY MOUTH EVERY DAY 90 tablet 0   HYDROcodone  bit-homatropine (HYCODAN) 5-1.5 MG/5ML syrup Take 5 mLs by mouth every 6 (six) hours as needed for cough. 473 mL 0   hydrOXYzine (  ATARAX) 25 MG tablet Take 25 mg by mouth at bedtime.     levothyroxine  (SYNTHROID ) 100 MCG tablet TAKE 1 TABLET BY MOUTH EVERY DAY 90 tablet 1   lidocaine -prilocaine  (EMLA ) cream Apply 1 application topically as needed. 30 g 0   loratadine (CLARITIN) 10 MG tablet Take 10 mg by mouth daily.     lumateperone tosylate (CAPLYTA) 10.5 MG capsule Take 42 mg by mouth daily. For sleep     omeprazole  (PRILOSEC) 40 MG capsule Take 1 capsule (40 mg total) by mouth daily. 90 capsule 4   ondansetron  (ZOFRAN ) 4 MG tablet Take 1 tablet (4 mg total) by mouth every 8 (eight) hours as needed. 40 tablet 2   oxyCODONE -acetaminophen  (PERCOCET/ROXICET) 5-325 MG tablet Take 1 tablet by mouth every 4 (four) hours as needed for severe pain. 30 tablet 0   pembrolizumab  (KEYTRUDA ) 100 MG/4ML SOLN See admin instructions.     polyethylene glycol powder (MIRALAX ) powder Take 17 g by mouth daily. 255 g 11    prochlorperazine  (COMPAZINE ) 10 MG tablet Take 1 tablet (10 mg total) by mouth every 6 (six) hours as needed. 30 tablet 2   temazepam  (RESTORIL ) 30 MG capsule Take 1 capsule (30 mg total) by mouth at bedtime as needed for sleep. 30 capsule 0   triamcinolone  cream (KENALOG ) 0.1 % APPLY TOPICALLY 2 TIMES DAILY AS NEEDED. 454 g 0   TRULANCE 3 MG TABS Take 1 tablet by mouth daily.     No current facility-administered medications for this visit.    SURGICAL HISTORY:  Past Surgical History:  Procedure Laterality Date   arm surgery Right    BRONCHIAL BRUSHINGS  10/24/2019   Procedure: BRONCHIAL BRUSHINGS;  Surgeon: Denson Flake, MD;  Location: [facility] ENDOSCOPY;  Service: Cardiopulmonary;;  right lower lobe    BRONCHIAL BRUSHINGS  11/05/2019   Procedure: BRONCHIAL BRUSHINGS;  Surgeon: Denson Flake, MD;  Location: [facility] ENDOSCOPY;  Service: Pulmonary;;   BRONCHIAL NEEDLE ASPIRATION BIOPSY  10/24/2019   Procedure: BRONCHIAL NEEDLE ASPIRATION BIOPSIES;  Surgeon: Denson Flake, MD;  Location: [facility] ENDOSCOPY;  Service: Cardiopulmonary;;   BRONCHIAL NEEDLE ASPIRATION BIOPSY  11/05/2019   Procedure: BRONCHIAL NEEDLE ASPIRATION BIOPSIES;  Surgeon: Denson Flake, MD;  Location: [facility] ENDOSCOPY;  Service: Pulmonary;;   ENDOBRONCHIAL ULTRASOUND N/A 10/24/2019   Procedure: ENDOBRONCHIAL ULTRASOUND;  Surgeon: Denson Flake, MD;  Location: [facility] ENDOSCOPY;  Service: Cardiopulmonary;  Laterality: N/A;   FINGER SURGERY Right    Middle   IR IMAGING GUIDED PORT INSERTION  11/19/2019   VIDEO BRONCHOSCOPY N/A 10/24/2019   Procedure: VIDEO BRONCHOSCOPY WITHOUT FLUORO;  Surgeon: Denson Flake, MD;  Location: [facility] ENDOSCOPY;  Service: Cardiopulmonary;  Laterality: N/A;   VIDEO BRONCHOSCOPY WITH ENDOBRONCHIAL NAVIGATION N/A 11/05/2019   Procedure: VIDEO BRONCHOSCOPY WITH ENDOBRONCHIAL NAVIGATION;  Surgeon: Denson Flake, MD;  Location: [facility] ENDOSCOPY;  Service: Pulmonary;  Laterality: N/A;    REVIEW OF SYSTEMS:   Review of  Systems  Constitutional: Positive for stable fatigue, weight loss, and cold intolerance. Negative for chills and fever.  HENT:  Negative for mouth sores, nosebleeds, sore throat and trouble swallowing.   Eyes:  Negative for eye problems and icterus.  Respiratory: Slightly improved shortness of breath with exertion and stable cough. Negative for hemoptysis and wheezing.   Cardiovascular: Negative for chest pain. Gastrointestinal: Positive for emesis with coughing occasionally in the AM. Negative for diarrhea, constipation, nausea. Genitourinary: Negative for bladder incontinence, difficulty urinating, dysuria, frequency and hematuria. Musculoskeletal: Positive for  leg pain. Negative for back pain, gait problem, neck pain and neck stiffness.  Skin: Positive for occasional rashes and itching (none at this time).  Neurological:   Negative for extremity weakness,  light-headedness and seizures.  Hematological: Negative for adenopathy. Does not bruise/bleed easily.  Psychiatric/Behavioral: Negative for confusion, depression and sleep disturbance. The patient is not nervous/anxious.         PHYSICAL EXAMINATION:  There were no vitals taken for this visit.  ECOG PERFORMANCE STATUS: 1  Physical Exam  Constitutional: Oriented to person, place, and time and well-developed, well-nourished, and in no distress. Aaron Aas  HENT:  Head: Normocephalic and atraumatic.  Mouth/Throat: Oropharynx is clear and moist. No oropharyngeal exudate.  Eyes: Conjunctivae are normal. Right eye exhibits no discharge. Left eye exhibits no discharge. No scleral icterus.  Neck: Normal range of motion. Neck supple.  Cardiovascular: Normal rate, regular rhythm, normal heart sounds and intact distal pulses.   Pulmonary/Chest: Effort normal. Quiet breath sounds bilaterally. No respiratory distress.  No rales.  Abdominal: Soft. Bowel sounds are normal. Exhibits no distension and no mass. There is no tenderness.  Musculoskeletal:  Normal range of motion. Exhibits no edema.  Lymphadenopathy:    No cervical adenopathy.  Neurological: Alert and oriented to person, place, and time. Exhibits normal muscle tone. Using a cane for ambulation today.  Skin: Skin is warm and dry. No rash noted. Not diaphoretic. No erythema. No pallor.  Psychiatric: Mood, memory and judgment normal.  Vitals reviewed.    LABORATORY DATA: Lab Results  Component Value Date   WBC 8.2 08/30/2023   HGB 10.0 (L) 08/30/2023   HCT 29.9 (L) 08/30/2023   MCV 90.9 08/30/2023   PLT 312 08/30/2023      Chemistry      Component Value Date/Time   NA 131 (L) 08/30/2023 0905   NA 141 03/08/2017 1707   K 3.6 08/30/2023 0905   CL 96 (L) 08/30/2023 0905   CO2 25 08/30/2023 0905   BUN 8 08/30/2023 0905   BUN 12 03/08/2017 1707   CREATININE 0.98 08/30/2023 0905      Component Value Date/Time   CALCIUM  8.6 (L) 08/30/2023 0905   ALKPHOS 68 08/30/2023 0905   AST 19 08/30/2023 0905   ALT 12 08/30/2023 0905   BILITOT 0.8 08/30/2023 0905       RADIOGRAPHIC STUDIES:  CT Chest W Contrast Result Date: 08/29/2023 CLINICAL DATA:  History of metastatic right lung adenocarcinoma on immunotherapy. Restaging. * Tracking Code: BO * EXAM: CT CHEST WITH CONTRAST TECHNIQUE: Multidetector CT imaging of the chest was performed during intravenous contrast administration. RADIATION DOSE REDUCTION: This exam was performed according to the departmental dose-optimization program which includes automated exposure control, adjustment of the mA and/or kV according to patient size and/or use of iterative reconstruction technique. CONTRAST:  OMNIPAQUE  IOHEXOL  300 MG/ML  SOLN COMPARISON:  CT chest dated 06/09/2023 FINDINGS: Cardiovascular: Right chest wall port terminates at the superior cavoatrial junction. Normal heart size. Small pericardial effusion. Great vessels are normal in course and caliber. No central pulmonary emboli. Coronary artery calcifications and aortic  atherosclerosis. Mediastinum/Nodes: Imaged thyroid  gland without nodules meeting criteria for imaging follow-up by size. Normal esophagus. No pathologically enlarged axillary, supraclavicular, mediastinal, or hilar lymph nodes. Lungs/Pleura: Central airways are patent. Interval slight decrease in size of infrahilar medial right lower lobe 4.0 x 3.3 cm mass (2:46), previously 4.2 x 3.7 cm. Similar mild diffuse bronchial wall thickening with new complete obstruction of right lower lobe bronchus (  7:99) resulting in increased right lower lobe atelectasis, which demonstrates heterogeneous hypoenhancement. Increased volume loss of the right lobe middle lobe with increased extension of irregular consolidation and fibrotic changes extending into the posterior right upper lobe and superior segment right lower lobe. Previously noted basilar right upper lobe subsolid nodule is no longer disc greatly seen and inseparable from the presumed post radiation changes. Interval multifocal new ground-glass and subsolid nodules throughout the bilateral upper and left lower lobe with new irregular patchy consolidation involving the medial left lower lobe. Previously noted new left lower lobe pulmonary nodule is no longer discretely seen. Similar background centrilobular emphysema. No pneumothorax. Increased moderate right pleural effusion. Upper abdomen: Please see separately dictated CT abdomen and pelvis report for detailed findings. Musculoskeletal: No acute or abnormal lytic or blastic osseous lesions. IMPRESSION: 1. Interval slight decrease in size of infrahilar medial right lower lobe mass. 2. New complete obstruction of right lower lobe bronchus resulting in increased right lower lobe atelectasis, which demonstrates heterogeneous hypoenhancement, raising suspicion for superimposed postobstructive pneumonia. 3. Increased volume loss of the right middle lobe with increased extension of irregular consolidation and fibrotic changes  into the posterior right upper lobe and superior segment right lower lobe, favored to reflect post radiation changes. 4. Interval multifocal new ground-glass and subsolid nodules throughout the bilateral upper and left lower lobes with new irregular patchy consolidation involving the medial left lower lobe, likely infectious/inflammatory. 5. Increased moderate right pleural effusion. 6. Aortic Atherosclerosis (ICD10-I70.0) and Emphysema (ICD10-J43.9). Coronary artery calcifications. Assessment for potential risk factor modification, dietary therapy or pharmacologic therapy may be warranted, if clinically indicated. Electronically Signed   By: Limin  Xu M.D.   On: 08/29/2023 13:59   CT ABDOMEN PELVIS W CONTRAST Result Date: 08/23/2023 EXAMINATION: CT ABDOMEN PELVIS W CONTRAST CLINICAL INDICATION: Male, 63 years old. Non-small cell lung cancer (NSCLC), staging TECHNIQUE: Axial CT of the abdomen and pelvis with 100 cc Omnipaque  300 intravenous contrast. Multiplanar reformations provided. Unless otherwise specified, incidental thyroid , adrenal, renal lesions do not require dedicated imaging follow up. Additionally, any mentioned pulmonary nodules do not require dedicated imaging follow-up based on the Fleischner guidelines unless otherwise specified. Coronary calcifications are not identified unless otherwise specified. COMPARISON: 06/09/2023 FINDINGS: Regarding findings of the lung bases, please refer to the chest report. The liver appears normal. The gallbladder is normal. The spleen is normal. Pancreas is normal. The adrenals are normal. The kidneys are normal other than a small left renal cyst. Abdominal aorta is normal in caliber. Scattered aphthous chronic changes are present. The bladder is normal. The prostate is normal. The appendix is normal. Large and small bowel loops are otherwise within normal limits. No free fluid or pathologic lymphadenopathy by size criteria. No concerning osseous lesions are  identified. There are degenerative changes of the spine and bony pelvis. IMPRESSION: No evidence for metastatic disease within the abdomen or pelvis. DOSE REDUCTION: This exam was performed according to our departmental dose-optimization program which includes automated exposure control, adjustment of the mA and/or kV according to patient size and/or use of iterative reconstruction technique. Electronically signed by: [place] Engel MD 08/23/2023 03:51 PM EDT RP Workstation: [id number]     ASSESSMENT/PLAN:  This is a very pleasant 63 year old Caucasian male diagnosed with stage IV non-small cell lung cancer, adenocarcinoma.  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 He was diagnosed in July 2021. His molecular studies by [employer] show he  has MSI high which is a good marker for response to immunotherapy which will be important for future treatment.     The patient will complete SRS to the solidary brain metastasis under the care of Dr. Jeryl Moris later today on 11/14/19.    He then underwent a course of weekly concurrent chemoradiation with carboplatin  for an AUC of 2 and paclitaxel  45 mg per metered squared.  He tolerated this treatment well except for fatigue and mild odynophagia.    The patient showed evidence of disease progression with an increase in volume of the right infrahilar mass.  Since the patient has MSI high, the patient then underwent treatment with single agent immunotherapy with Keytruda  200 mg IV every 3 weeks.   The patient is status post 35 cycles and completed his 2 years of treatment on 03/23/22.  He also received 7 cycles of Avastin  for vasogenic edema in the brain.  Avastin  has been on hold unless needed again in the future.     He has SRS to an occipital brain lesion on 12/22/22 by Dr. Jeryl Moris  He had evidence of progression on his surveillance imaging from July 2024. Therefore, he resumed immunotherapy  with Keytruda . He is status post 9 cycles.    He underwent UHRT in the last day was on 03/03/2023 for some disease progression that was seen at that time. He is also was found to have new metastatic disease to the brain and had SRS on 03/07/23.    Unfortunately, the patient recently had continued CNS progression.  He is undergoing SRS under the care of Dr. Jeryl Moris in the last day was earlier today on 06/21/2023.   In light of the progression in the brain, Dr. Marguerita Shih and the patient are in agreement to try to add on systemic chemotherapy with carboplatin  for an AUC of 5, Alimta 500 mg/m, and Keytruda  200 mg IV every 3 weeks to see if it helps with his progression in the brain.  Status post 3 cycles.  He has had some cytopenias requiring supportive care.   The patient has had some trouble with weakness and cytopenias since undergoing chemotherapy. Due to concerns with quality of life, I spoke with Dr. Marguerita Shih. Dr. Marguerita Shih will reduce his carboplatin  dose to an AUC of 4 and Alimta to 400 mg/m2 with cycle #4 which is scheduled for Friday.   This is his last cycle of carboplatin .  Starting from his next cycle he will only be on maintenance Alimta and Keytruda  which hopefully will have less cytopenias requiring supportive care and side effects  We will arrange for weekly lab appointments to ensure he does not need any supportive care in the interval.   At his last appointment we arranged for lower extremity Doppler ultrasound to ensure no DVT.  This was negative. Would consider referral to vascular to assess for claudication   He was encouraged to drink protein supplemental drinks.   His last scan showed some effusion.  Will continue to monitor and if this increases in size we may have to consider thoracentesis.  We will see him back for follow-up in 3 weeks for evaluation repeat blood work before undergoing cycle #5.  Would consider him for a blood transfusion if his hemoglobin were less than 8.  We  would consider him for G-CSF injections if his ANC is less than 0.5.  He has hycodan if needed for cough. He declined needing a refill at this time.   He will increase his calcium  rich  food in his diet for his hand cramping and hypocalcemia.   Low hemoglobin Hemoglobin at 9.7, slightly decreased but not transfusion threshold. Platelet and leukocyte counts normal. Monitoring necessary due to chemotherapy. - Monitor hemoglobin levels weekly to assess need for transfusion or injections. Would consider blood transfusion if Hbg <8 -Would consider GCSF if ANC ~0.6 or less.   The patient was advised to call immediately if he has any concerning symptoms in the interval. The patient voices understanding of current disease status and treatment options and is in agreement with the current care plan. All questions were answered. The patient knows to call the clinic with any problems, questions or concerns. We can certainly see the patient much sooner if necessary         No orders of the defined types were placed in this encounter.     The total time spent in the appointment was 30-39 minutes  Jalaiyah Throgmorton L Veleta Yamamoto, PA-C 09/05/23

## 2023-09-05 NOTE — Telephone Encounter (Signed)
 Spoke with patient this morning in regards to appt change. Informed patient that the appt was changed because treatment was deferred a week.  Appts were adjusted as needed. Patient voiced understanding.

## 2023-09-06 ENCOUNTER — Ambulatory Visit (HOSPITAL_BASED_OUTPATIENT_CLINIC_OR_DEPARTMENT_OTHER)
Admission: RE | Admit: 2023-09-06 | Discharge: 2023-09-06 | Disposition: A | Discharge status: Home / Self Care | Source: Ambulatory Visit | Attending: Physician Assistant | Admitting: Physician Assistant

## 2023-09-06 ENCOUNTER — Inpatient Hospital Stay (HOSPITAL_BASED_OUTPATIENT_CLINIC_OR_DEPARTMENT_OTHER): Admitting: Physician Assistant

## 2023-09-06 ENCOUNTER — Inpatient Hospital Stay

## 2023-09-06 ENCOUNTER — Inpatient Hospital Stay: Admitting: Physician Assistant

## 2023-09-06 VITALS — BP 110/81 | HR 112 | Temp 97.7°F | Resp 16 | Wt 171.7 lb

## 2023-09-06 DIAGNOSIS — Z5112 Encounter for antineoplastic immunotherapy: Secondary | ICD-10-CM | POA: Diagnosis not present

## 2023-09-06 DIAGNOSIS — C3491 Malignant neoplasm of unspecified part of right bronchus or lung: Secondary | ICD-10-CM | POA: Diagnosis not present

## 2023-09-06 DIAGNOSIS — Z95828 Presence of other vascular implants and grafts: Secondary | ICD-10-CM

## 2023-09-06 DIAGNOSIS — Z5111 Encounter for antineoplastic chemotherapy: Secondary | ICD-10-CM

## 2023-09-06 DIAGNOSIS — J189 Pneumonia, unspecified organism: Secondary | ICD-10-CM | POA: Diagnosis not present

## 2023-09-06 DIAGNOSIS — D649 Anemia, unspecified: Secondary | ICD-10-CM

## 2023-09-06 DIAGNOSIS — C7931 Secondary malignant neoplasm of brain: Secondary | ICD-10-CM | POA: Diagnosis not present

## 2023-09-06 DIAGNOSIS — M79606 Pain in leg, unspecified: Secondary | ICD-10-CM | POA: Diagnosis not present

## 2023-09-06 LAB — CBC WITH DIFFERENTIAL (CANCER CENTER ONLY)
Abs Immature Granulocytes: 0.16 10*3/uL — ABNORMAL HIGH (ref 0.00–0.07)
Basophils Absolute: 0.1 10*3/uL (ref 0.0–0.1)
Basophils Relative: 1 %
Eosinophils Absolute: 0.1 10*3/uL (ref 0.0–0.5)
Eosinophils Relative: 1 %
HCT: 29 % — ABNORMAL LOW (ref 39.0–52.0)
Hemoglobin: 9.7 g/dL — ABNORMAL LOW (ref 13.0–17.0)
Immature Granulocytes: 2 %
Lymphocytes Relative: 5 %
Lymphs Abs: 0.5 10*3/uL — ABNORMAL LOW (ref 0.7–4.0)
MCH: 30.4 pg (ref 26.0–34.0)
MCHC: 33.4 g/dL (ref 30.0–36.0)
MCV: 90.9 fL (ref 80.0–100.0)
Monocytes Absolute: 1.2 10*3/uL — ABNORMAL HIGH (ref 0.1–1.0)
Monocytes Relative: 11 %
Neutro Abs: 8.5 10*3/uL — ABNORMAL HIGH (ref 1.7–7.7)
Neutrophils Relative %: 80 %
Platelet Count: 345 10*3/uL (ref 150–400)
RBC: 3.19 MIL/uL — ABNORMAL LOW (ref 4.22–5.81)
RDW: 16.9 % — ABNORMAL HIGH (ref 11.5–15.5)
WBC Count: 10.5 10*3/uL (ref 4.0–10.5)
nRBC: 0 % (ref 0.0–0.2)

## 2023-09-06 LAB — CMP (CANCER CENTER ONLY)
ALT: 12 U/L (ref 0–44)
AST: 19 U/L (ref 15–41)
Albumin: 3.3 g/dL — ABNORMAL LOW (ref 3.5–5.0)
Alkaline Phosphatase: 74 U/L (ref 38–126)
Anion gap: 8 (ref 5–15)
BUN: 7 mg/dL — ABNORMAL LOW (ref 8–23)
CO2: 25 mmol/L (ref 22–32)
Calcium: 8.6 mg/dL — ABNORMAL LOW (ref 8.9–10.3)
Chloride: 96 mmol/L — ABNORMAL LOW (ref 98–111)
Creatinine: 0.85 mg/dL (ref 0.61–1.24)
GFR, Estimated: >60 mL/min (ref >60)
Glucose, Bld: 103 mg/dL — ABNORMAL HIGH (ref 70–99)
Potassium: 3.4 mmol/L — ABNORMAL LOW (ref 3.5–5.1)
Sodium: 129 mmol/L — ABNORMAL LOW (ref 135–145)
Total Bilirubin: 1 mg/dL (ref 0.0–1.2)
Total Protein: 6.4 g/dL — ABNORMAL LOW (ref 6.5–8.1)

## 2023-09-06 LAB — SAMPLE TO BLOOD BANK

## 2023-09-06 MED ORDER — SODIUM CHLORIDE 0.9% FLUSH
10.0000 mL | INTRAVENOUS | Status: DC | PRN
  Administered 2023-09-06: 10 mL

## 2023-09-06 MED ORDER — HEPARIN SOD (PORK) LOCK FLUSH 100 UNIT/ML IV SOLN
500.0000 [IU] | Freq: Once | INTRAVENOUS | Status: AC | PRN
  Administered 2023-09-06: 500 [IU]

## 2023-09-07 MED FILL — Fosaprepitant Dimeglumine For IV Infusion 150 MG (Base Eq): INTRAVENOUS | Qty: 5 | Status: AC

## 2023-09-08 ENCOUNTER — Inpatient Hospital Stay

## 2023-09-08 ENCOUNTER — Other Ambulatory Visit: Payer: Self-pay

## 2023-09-08 VITALS — BP 118/81 | HR 98 | Temp 98.2°F | Resp 18 | Wt 172.0 lb

## 2023-09-08 DIAGNOSIS — C7931 Secondary malignant neoplasm of brain: Secondary | ICD-10-CM | POA: Diagnosis not present

## 2023-09-08 DIAGNOSIS — C3491 Malignant neoplasm of unspecified part of right bronchus or lung: Secondary | ICD-10-CM

## 2023-09-08 MED ORDER — PALONOSETRON HCL INJECTION 0.25 MG/5ML
0.2500 mg | Freq: Once | INTRAVENOUS | Status: AC
  Administered 2023-09-08: 0.25 mg via INTRAVENOUS
  Filled 2023-09-08: qty 5

## 2023-09-08 MED ORDER — SODIUM CHLORIDE 0.9 % IV SOLN
476.4000 mg | Freq: Once | INTRAVENOUS | Status: AC
  Administered 2023-09-08: 480 mg via INTRAVENOUS
  Filled 2023-09-08: qty 48

## 2023-09-08 MED ORDER — DEXAMETHASONE SODIUM PHOSPHATE 10 MG/ML IJ SOLN
10.0000 mg | Freq: Once | INTRAMUSCULAR | Status: AC
  Administered 2023-09-08: 10 mg via INTRAVENOUS
  Filled 2023-09-08: qty 1

## 2023-09-08 MED ORDER — SODIUM CHLORIDE 0.9 % IV SOLN
INTRAVENOUS | Status: DC
Start: 2023-09-08 — End: 2023-09-08

## 2023-09-08 MED ORDER — PEMETREXED DISODIUM CHEMO 500 MG/20ML IV SOLN
400.0000 mg/m2 | Freq: Once | INTRAVENOUS | Status: AC
  Administered 2023-09-08: 800 mg via INTRAVENOUS
  Filled 2023-09-08: qty 20

## 2023-09-08 MED ORDER — SODIUM CHLORIDE 0.9 % IV SOLN
150.0000 mg | Freq: Once | INTRAVENOUS | Status: AC
  Administered 2023-09-08: 150 mg via INTRAVENOUS
  Filled 2023-09-08: qty 5
  Filled 2023-09-08: qty 150

## 2023-09-08 MED ORDER — SODIUM CHLORIDE 0.9 % IV SOLN
200.0000 mg | Freq: Once | INTRAVENOUS | Status: AC
  Administered 2023-09-08: 200 mg via INTRAVENOUS
  Filled 2023-09-08: qty 200

## 2023-09-11 ENCOUNTER — Encounter: Payer: Self-pay | Admitting: Physician Assistant

## 2023-09-11 ENCOUNTER — Ambulatory Visit
Admission: RE | Admit: 2023-09-11 | Discharge: 2023-09-11 | Disposition: A | Discharge status: Home / Self Care | Payer: Managed Care, Other (non HMO) | Source: Ambulatory Visit | Attending: Internal Medicine | Admitting: Internal Medicine

## 2023-09-11 ENCOUNTER — Encounter: Payer: Self-pay | Admitting: Internal Medicine

## 2023-09-11 DIAGNOSIS — C7931 Secondary malignant neoplasm of brain: Secondary | ICD-10-CM

## 2023-09-11 MED ORDER — SODIUM CHLORIDE 0.9% FLUSH
10.0000 mL | INTRAVENOUS | Status: DC | PRN
  Administered 2023-09-11: 10 mL via INTRAVENOUS

## 2023-09-11 MED ORDER — HEPARIN SOD (PORK) LOCK FLUSH 100 UNIT/ML IV SOLN
500.0000 [IU] | Freq: Once | INTRAVENOUS | Status: AC
  Administered 2023-09-11: 500 [IU] via INTRAVENOUS

## 2023-09-11 MED ORDER — GADOPICLENOL 0.5 MMOL/ML IV SOLN
8.5000 mL | Freq: Once | INTRAVENOUS | Status: AC | PRN
Start: 2023-09-11 — End: 2023-09-11
  Administered 2023-09-11: 8.5 mL via INTRAVENOUS

## 2023-09-12 ENCOUNTER — Other Ambulatory Visit: Payer: Self-pay

## 2023-09-12 ENCOUNTER — Ambulatory Visit: Payer: Managed Care, Other (non HMO) | Admitting: Internal Medicine

## 2023-09-13 ENCOUNTER — Inpatient Hospital Stay

## 2023-09-14 ENCOUNTER — Inpatient Hospital Stay (HOSPITAL_BASED_OUTPATIENT_CLINIC_OR_DEPARTMENT_OTHER): Payer: Managed Care, Other (non HMO) | Admitting: Internal Medicine

## 2023-09-14 ENCOUNTER — Other Ambulatory Visit: Payer: Managed Care, Other (non HMO)

## 2023-09-14 ENCOUNTER — Inpatient Hospital Stay

## 2023-09-14 ENCOUNTER — Ambulatory Visit: Payer: Managed Care, Other (non HMO) | Admitting: Physician Assistant

## 2023-09-14 ENCOUNTER — Ambulatory Visit: Payer: Managed Care, Other (non HMO)

## 2023-09-14 VITALS — BP 108/79 | HR 59 | Temp 98.2°F | Resp 16 | Wt 167.6 lb

## 2023-09-14 DIAGNOSIS — Z95828 Presence of other vascular implants and grafts: Secondary | ICD-10-CM

## 2023-09-14 DIAGNOSIS — C3491 Malignant neoplasm of unspecified part of right bronchus or lung: Secondary | ICD-10-CM

## 2023-09-14 DIAGNOSIS — C7931 Secondary malignant neoplasm of brain: Secondary | ICD-10-CM | POA: Diagnosis not present

## 2023-09-14 LAB — CMP (CANCER CENTER ONLY)
ALT: 15 U/L (ref 0–44)
AST: 20 U/L (ref 15–41)
Albumin: 3.4 g/dL — ABNORMAL LOW (ref 3.5–5.0)
Alkaline Phosphatase: 82 U/L (ref 38–126)
Anion gap: 9 (ref 5–15)
BUN: 8 mg/dL (ref 8–23)
CO2: 26 mmol/L (ref 22–32)
Calcium: 8.6 mg/dL — ABNORMAL LOW (ref 8.9–10.3)
Chloride: 95 mmol/L — ABNORMAL LOW (ref 98–111)
Creatinine: 0.84 mg/dL (ref 0.61–1.24)
GFR, Estimated: >60 mL/min (ref >60)
Glucose, Bld: 127 mg/dL — ABNORMAL HIGH (ref 70–99)
Potassium: 3.6 mmol/L (ref 3.5–5.1)
Sodium: 130 mmol/L — ABNORMAL LOW (ref 135–145)
Total Bilirubin: 0.9 mg/dL (ref 0.0–1.2)
Total Protein: 6.7 g/dL (ref 6.5–8.1)

## 2023-09-14 LAB — CBC WITH DIFFERENTIAL (CANCER CENTER ONLY)
Abs Immature Granulocytes: 0.04 10*3/uL (ref 0.00–0.07)
Basophils Absolute: 0 10*3/uL (ref 0.0–0.1)
Basophils Relative: 0 %
Eosinophils Absolute: 0 10*3/uL (ref 0.0–0.5)
Eosinophils Relative: 0 %
HCT: 27.3 % — ABNORMAL LOW (ref 39.0–52.0)
Hemoglobin: 9 g/dL — ABNORMAL LOW (ref 13.0–17.0)
Immature Granulocytes: 1 %
Lymphocytes Relative: 7 %
Lymphs Abs: 0.3 10*3/uL — ABNORMAL LOW (ref 0.7–4.0)
MCH: 29.7 pg (ref 26.0–34.0)
MCHC: 33 g/dL (ref 30.0–36.0)
MCV: 90.1 fL (ref 80.0–100.0)
Monocytes Absolute: 0.2 10*3/uL (ref 0.1–1.0)
Monocytes Relative: 3 %
Neutro Abs: 4.3 10*3/uL (ref 1.7–7.7)
Neutrophils Relative %: 89 %
Platelet Count: 197 10*3/uL (ref 150–400)
RBC: 3.03 MIL/uL — ABNORMAL LOW (ref 4.22–5.81)
RDW: 15.9 % — ABNORMAL HIGH (ref 11.5–15.5)
Smear Review: NORMAL
WBC Count: 4.9 10*3/uL (ref 4.0–10.5)
nRBC: 0 % (ref 0.0–0.2)

## 2023-09-14 LAB — SAMPLE TO BLOOD BANK

## 2023-09-14 MED ORDER — SODIUM CHLORIDE 0.9% FLUSH
10.0000 mL | INTRAVENOUS | Status: DC | PRN
  Administered 2023-09-14: 10 mL

## 2023-09-14 MED ORDER — HEPARIN SOD (PORK) LOCK FLUSH 100 UNIT/ML IV SOLN
500.0000 [IU] | Freq: Once | INTRAVENOUS | Status: AC | PRN
  Administered 2023-09-14: 500 [IU]

## 2023-09-14 MED ORDER — LORAZEPAM 1 MG PO TABS: 1.0000 mg | ORAL_TABLET | Freq: Once | ORAL | 0 refills | Status: DC | PRN

## 2023-09-14 NOTE — Progress Notes (Signed)
 [facility] [address]  [place], [place] [address] [phone]   Interval Evaluation  Date of Service: 09/14/23 Patient Name: Joseph Atkins Patient MRN: [id number] Patient DOB: 05/29/1960 Provider: Mamie Searles, MD  Identifying Statement:  Joseph Atkins is a 63 y.o. male with Malignant neoplasm metastatic to brain [facility]) [C79.31]   Primary Cancer:  Oncologic History: Oncology History  Adenocarcinoma of right lung, stage 4 (HCC)  11/14/2019 Initial Diagnosis   Adenocarcinoma of right lung, stage 4 (HCC)   11/25/2019 - 01/06/2020 Chemotherapy   The patient had palonosetron  (ALOXI ) injection 0.25 mg, 0.25 mg, Intravenous,  Once, 7 of 7 cycles Administration: 0.25 mg (11/25/2019), 0.25 mg (12/23/2019), 0.25 mg (12/31/2019), 0.25 mg (12/02/2019), 0.25 mg (01/06/2020), 0.25 mg (12/09/2019), 0.25 mg (12/16/2019) CARBOplatin  (PARAPLATIN ) 250 mg in sodium chloride  0.9 % 250 mL chemo infusion, 249.8 mg (100 % of original dose 249.8 mg), Intravenous,  Once, 7 of 7 cycles Dose modification: 249.8 mg (original dose 249.8 mg, Cycle 1) Administration: 250 mg (11/25/2019), 250 mg (12/23/2019), 250 mg (12/31/2019), 250 mg (12/02/2019), 250 mg (01/06/2020), 270 mg (12/09/2019), 250 mg (12/16/2019) PACLitaxel  (TAXOL ) 90 mg in sodium chloride  0.9 % 250 mL chemo infusion (</= 80mg /m2), 45 mg/m2 = 90 mg, Intravenous,  Once, 7 of 7 cycles Administration: 90 mg (11/25/2019), 90 mg (12/23/2019), 90 mg (12/31/2019), 90 mg (12/02/2019), 90 mg (01/06/2020), 90 mg (12/09/2019), 90 mg (12/16/2019)  for chemotherapy treatment.    02/18/2020 - 03/23/2022 Chemotherapy   Patient is on Treatment Plan : LUNG NSCLC Pembrolizumab  (200) q21d     03/31/2020 - 07/21/2020 Chemotherapy    Patient is on Treatment Plan: LUNG NSCLC FLAT DOSE PEMBROLIZUMAB  Q21D      05/19/2020 Cancer Staging   Staging form: Lung, AJCC 8th Edition - Clinical: Stage IVB (cT3, cN0, cM1c) - Signed by Marlene Simas, MD on  05/19/2020   11/16/2022 - 05/25/2023 Chemotherapy   Patient is on Treatment Plan : LUNG NSCLC Pembrolizumab  (200) q21d     06/28/2023 -  Chemotherapy   Patient is on Treatment Plan : LUNG Carboplatin  (5) + Pemetrexed  (500) + Pembrolizumab  (200) D1 q21d Induction x 4 cycles / Maintenance Pemetrexed  (500) + Pembrolizumab  (200) D1 q21d      CNS Oncologic History 11/14/19: Completes single frx SRS to 2.4cm cerebellar metastasis 03/05/20: SRS to 7 targets Jeryl Moris) 06/19/20: SRS to 6 additional targets Jeryl Moris) 12/22/22: SRS R occipital Jeryl Moris) 03/03/23: SRS x2 (Moody) 06/21/23: SRS x8 Jeryl Moris)  Interval History:  Joseph Atkins presents today for follow up after recent MRI brain.  No significant neurologic changes described today.  Continues to have issues with balance, uses a cane. He has now discontinued decadron .  No headaches or seizures.  Completed 4 cycles of carboplatin  with Dr. Marguerita Shih, now just on Alimta and Keytruda .  Prior- He describes recurrence of balance issues from prior.  Symptoms declined when he stopped the decadron  on 9/14.  He is needing to use a walker.  Also having more headaches and impaired memory.  He does complain of ongoing fatigue and low energy, not worse from prior. Continues Keytruda  for disease recurrence with Dr. Marguerita Shih.  H+P (01/24/20) Patient presents today to discuss recent recurrence of neurologic symptoms following radiosurgery in July 2021.  He describes poor balance, wide based walking, needing to hold on to family or objects to walk safely.  He also describes nausea and dizzy-headed feeling at times.  Overall he is functioning in an  impaired and sluggish way.  Symptoms have become noticeable since decreasing decadron  to less than 4mg  daily; currently he is dosing 2mg  daily.  He felt "quite good" immediately after radiation and during higher dose steroid therapy.  No complications from decadron  that he can report.  Recently completed chemoradioatherapy induction  for lung adenocarcinoma with Dr. Marguerita Shih.  Medications: Current Outpatient Medications on File Prior to Visit  Medication Sig Dispense Refill   acetaminophen  (TYLENOL ) 500 MG tablet Take 500 mg by mouth every 6 (six) hours as needed.     albuterol  (PROVENTIL  HFA;VENTOLIN  HFA) 108 (90 Base) MCG/ACT inhaler Inhale 2 puffs into the lungs every 6 (six) hours as needed for wheezing or shortness of breath. 1 Inhaler 0   amLODipine  (NORVASC ) 5 MG tablet TAKE 1 TABLET (5 MG TOTAL) BY MOUTH DAILY. 90 tablet 1   ANORO ELLIPTA 62.5-25 MCG/INH AEPB 1 puff daily.     augmented betamethasone dipropionate (DIPROLENE-AF) 0.05 % cream Apply topically 2 (two) times daily.     benzonatate  (TESSALON ) 100 MG capsule Take 1 capsule (100 mg total) by mouth 3 (three) times daily as needed. 30 capsule 2   [facility] ASPIRIN  LOW DOSE 81 MG tablet TAKE 1 TABLET (81 MG TOTAL) BY MOUTH DAILY. SWALLOW WHOLE. 90 tablet 3   cyclobenzaprine  (FLEXERIL ) 10 MG tablet Take 10 mg by mouth 2 (two) times daily as needed for muscle spasms.     docusate sodium  (COLACE) 100 MG capsule Take 100 mg by mouth daily.     doxycycline  (VIBRA -TABS) 100 MG tablet Take 1 tablet (100 mg total) by mouth 2 (two) times daily. 14 tablet 0   DULoxetine  (CYMBALTA ) 20 MG capsule Take 1 capsule (20 mg total) by mouth 2 (two) times daily. 60 capsule 5   fluticasone  furoate-vilanterol (BREO ELLIPTA ) 100-25 MCG/INH AEPB Inhale 1 puff into the lungs daily. 30 each 3   folic acid  (FOLVITE ) 1 MG tablet TAKE 1 TABLET BY MOUTH EVERY DAY 90 tablet 0   HYDROcodone  bit-homatropine (HYCODAN) 5-1.5 MG/5ML syrup Take 5 mLs by mouth every 6 (six) hours as needed for cough. 473 mL 0   hydrOXYzine (ATARAX) 25 MG tablet Take 25 mg by mouth at bedtime.     levothyroxine  (SYNTHROID ) 100 MCG tablet TAKE 1 TABLET BY MOUTH EVERY DAY 90 tablet 1   lidocaine -prilocaine  (EMLA ) cream Apply 1 application topically as needed. 30 g 0   loratadine (CLARITIN) 10 MG tablet Take 10 mg by mouth  daily.     lumateperone tosylate (CAPLYTA) 10.5 MG capsule Take 42 mg by mouth daily. For sleep     omeprazole  (PRILOSEC) 40 MG capsule Take 1 capsule (40 mg total) by mouth daily. 90 capsule 4   ondansetron  (ZOFRAN ) 4 MG tablet Take 1 tablet (4 mg total) by mouth every 8 (eight) hours as needed. 40 tablet 2   pembrolizumab  (KEYTRUDA ) 100 MG/4ML SOLN See admin instructions.     polyethylene glycol powder (MIRALAX ) powder Take 17 g by mouth daily. 255 g 11   prochlorperazine  (COMPAZINE ) 10 MG tablet Take 1 tablet (10 mg total) by mouth every 6 (six) hours as needed. 30 tablet 2   temazepam  (RESTORIL ) 30 MG capsule Take 1 capsule (30 mg total) by mouth at bedtime as needed for sleep. 30 capsule 0   triamcinolone  cream (KENALOG ) 0.1 % APPLY TOPICALLY 2 TIMES DAILY AS NEEDED. 454 g 0   TRULANCE 3 MG TABS Take 1 tablet by mouth daily.     No current facility-administered  medications on file prior to visit.    Allergies:  Allergies  Allergen Reactions   Penicillins Other (See Comments)    Childhood allergy.  Has patient had a PCN reaction causing immediate rash, facial/tongue/throat swelling, SOB or lightheadedness with hypotension: unknown Has patient had a PCN reaction causing severe rash involving mucus membranes or skin necrosis: unknown Has patient had a PCN reaction that required hospitalization: unknown Has patient had a PCN reaction occurring within the last 10 years: no If all of the above answers are "NO", then may proceed with Cephalosporin use.    Past Medical History:  Past Medical History:  Diagnosis Date   Cancer (HCC)    lung cancer   Diverticulosis    GERD (gastroesophageal reflux disease)    Hx of small bowel obstruction    Hyperlipidemia    Hypertension    Hypothyroidism    IBS (irritable bowel syndrome)    Substance abuse (HCC)    Alcoholic, Drug addition   Thyroid  disease    Past Surgical History:  Past Surgical History:  Procedure Laterality Date   arm  surgery Right    BRONCHIAL BRUSHINGS  10/24/2019   Procedure: BRONCHIAL BRUSHINGS;  Surgeon: Denson Flake, MD;  Location: [facility] ENDOSCOPY;  Service: Cardiopulmonary;;  right lower lobe    BRONCHIAL BRUSHINGS  11/05/2019   Procedure: BRONCHIAL BRUSHINGS;  Surgeon: Denson Flake, MD;  Location: [facility] ENDOSCOPY;  Service: Pulmonary;;   BRONCHIAL NEEDLE ASPIRATION BIOPSY  10/24/2019   Procedure: BRONCHIAL NEEDLE ASPIRATION BIOPSIES;  Surgeon: Denson Flake, MD;  Location: [facility] ENDOSCOPY;  Service: Cardiopulmonary;;   BRONCHIAL NEEDLE ASPIRATION BIOPSY  11/05/2019   Procedure: BRONCHIAL NEEDLE ASPIRATION BIOPSIES;  Surgeon: Denson Flake, MD;  Location: [facility] ENDOSCOPY;  Service: Pulmonary;;   ENDOBRONCHIAL ULTRASOUND N/A 10/24/2019   Procedure: ENDOBRONCHIAL ULTRASOUND;  Surgeon: Denson Flake, MD;  Location: [facility] ENDOSCOPY;  Service: Cardiopulmonary;  Laterality: N/A;   FINGER SURGERY Right    Middle   IR IMAGING GUIDED PORT INSERTION  11/19/2019   VIDEO BRONCHOSCOPY N/A 10/24/2019   Procedure: VIDEO BRONCHOSCOPY WITHOUT FLUORO;  Surgeon: Denson Flake, MD;  Location: [facility] ENDOSCOPY;  Service: Cardiopulmonary;  Laterality: N/A;   VIDEO BRONCHOSCOPY WITH ENDOBRONCHIAL NAVIGATION N/A 11/05/2019   Procedure: VIDEO BRONCHOSCOPY WITH ENDOBRONCHIAL NAVIGATION;  Surgeon: Denson Flake, MD;  Location: [facility] ENDOSCOPY;  Service: Pulmonary;  Laterality: N/A;   Social History:  Social History   Socioeconomic History   Marital status: Married    Spouse name: Not on file   Number of children: 2   Years of education: Not on file   Highest education level: Not on file  Occupational History   Occupation: supervisor    Employer: [employer]  Tobacco Use   Smoking status: Former    Current packs/day: 1.50    Types: Cigarettes   Smokeless tobacco: Former    Types: Associate Professor status: Never Used  Substance and Sexual Activity   Alcohol use: No    Comment: Alcoholic clean for 6 years   Drug  use: No    Comment: Recovering Drug Addict-clean for 6 years   Sexual activity: Not on file  Other Topics Concern   Not on file  Social History Narrative   Not on file   Social Drivers of Health   Financial Resource Strain: Medium Risk (10/31/2019)   Overall Financial Resource Strain (CARDIA)    Difficulty of Paying Living Expenses: Somewhat hard  Food Insecurity:  No Food Insecurity (12/13/2022)   Hunger Vital Sign    Worried About Running Out of Food in the Last Year: Never true    Ran Out of Food in the Last Year: Never true  Transportation Needs: No Transportation Needs (12/13/2022)   PRAPARE - Administrator, Civil Service (Medical): No    Lack of Transportation (Non-Medical): No  Physical Activity: Insufficiently Active (10/31/2019)   Exercise Vital Sign    Days of Exercise per Week: 5 days    Minutes of Exercise per Session: 20 min  Stress: Stress Concern Present (10/31/2019)   [employer] of Occupational Health - Occupational Stress Questionnaire    Feeling of Stress : Very much  Social Connections: Moderately Isolated (10/31/2019)   Social Connection and Isolation Panel [NHANES]    Frequency of Communication with Friends and Family: More than three times a week    Frequency of Social Gatherings with Friends and Family: More than three times a week    Attends Religious Services: Never    Database administrator or Organizations: No    Attends Banker Meetings: Never    Marital Status: Married  Catering manager Violence: Not At Risk (12/13/2022)   Humiliation, Afraid, Rape, and Kick questionnaire    Fear of Current or Ex-Partner: No    Emotionally Abused: No    Physically Abused: No    Sexually Abused: No   Family History:  Family History  Problem Relation Age of Onset   Epilepsy Mother    Heart disease Mother    Heart disease Brother    Lung cancer Paternal Uncle     Review of Systems: Constitutional: Doesn't report fevers, chills or  abnormal weight loss Eyes: Doesn't report blurriness of vision Ears, nose, mouth, throat, and face: Doesn't report sore throat Respiratory: Doesn't report cough, dyspnea or wheezes Cardiovascular: Doesn't report palpitation, chest discomfort  Gastrointestinal:  Doesn't report nausea, constipation, diarrhea GU: Doesn't report incontinence Skin: Doesn't report skin rashes Neurological: Per HPI Musculoskeletal: Doesn't report joint pain Behavioral/Psych: Doesn't report anxiety  Physical Exam: Wt Readings from Last 3 Encounters:  09/14/23 167 lb 9.6 oz (76 kg)  09/08/23 172 lb (78 kg)  09/06/23 171 lb 11.2 oz (77.9 kg)   Temp Readings from Last 3 Encounters:  09/14/23 98.2 F (36.8 C) (Temporal)  09/08/23 98.2 F (36.8 C) (Oral)  09/06/23 97.7 F (36.5 C) (Temporal)   BP Readings from Last 3 Encounters:  09/14/23 108/79  09/08/23 118/81  09/06/23 110/81   Pulse Readings from Last 3 Encounters:  09/14/23 (!) 59  09/08/23 98  09/06/23 (!) 112     KPS: 70. General: Alert, cooperative, pleasant, in no acute distress Head: Normal EENT: No conjunctival injection or scleral icterus.  Lungs: Resp effort normal Cardiac: Regular rate Abdomen: Non-distended abdomen Skin: No rashes cyanosis or petechiae. Extremities: No clubbing or edema  Neurologic Exam: Mental Status: Awake, alert, attentive to examiner. Oriented to self and environment. Language is fluent with intact comprehension.  Cranial Nerves: Visual acuity is grossly normal. Visual fields are full. Extra-ocular movements intact. No ptosis. Face is symmetric Motor: Tone and bulk are normal. Power is 4/5 in right leg. Reflexes are symmetric, no pathologic reflexes present.  Dysmetria L>R Sensory: Intact to light touch Gait: Dystaxic, wide based   Labs: I have reviewed the data as listed    Component Value Date/Time   NA 129 (L) 09/06/2023 1052   NA 141 03/08/2017 1707   K  3.4 (L) 09/06/2023 1052   CL 96 (L)  09/06/2023 1052   CO2 25 09/06/2023 1052   GLUCOSE 103 (H) 09/06/2023 1052   BUN 7 (L) 09/06/2023 1052   BUN 12 03/08/2017 1707   CREATININE 0.85 09/06/2023 1052   CALCIUM  8.6 (L) 09/06/2023 1052   PROT 6.4 (L) 09/06/2023 1052   PROT 7.1 03/08/2017 1707   ALBUMIN 3.3 (L) 09/06/2023 1052   ALBUMIN 4.5 03/08/2017 1707   AST 19 09/06/2023 1052   ALT 12 09/06/2023 1052   ALKPHOS 74 09/06/2023 1052   BILITOT 1.0 09/06/2023 1052   GFRNONAA >60 09/06/2023 1052   GFRAA >60 01/06/2020 0837   Lab Results  Component Value Date   WBC 4.9 09/14/2023   NEUTROABS PENDING 09/14/2023   HGB 9.0 (L) 09/14/2023   HCT 27.3 (L) 09/14/2023   MCV 90.1 09/14/2023   PLT 197 09/14/2023   Imaging:  [facility] Clinician Interpretation: I have personally reviewed the CNS images as listed.  My interpretation, in the context of the patient's clinical presentation, is likely treatment effect  MR BRAIN W WO CONTRAST Result Date: 09/12/2023 CLINICAL DATA:  Initial evaluation for brain/CNS neoplasm, assess treatment response. EXAM: MRI HEAD WITHOUT AND WITH CONTRAST TECHNIQUE: Multiplanar, multiecho pulse sequences of the brain and surrounding structures were obtained without and with intravenous contrast. CONTRAST:  8.5 mL of Vueway  COMPARISON:  Prior MRI from 06/01/2023 FINDINGS: Brain: Age-related cerebral atrophy with chronic microvascular ischemic disease. No acute or subacute infarct. Stable lesions: 1. Metastatic lesion involving the inferior left cerebellum is relatively stable in size measuring 2.8 x 2.0 x 1.9 cm. Surrounding edema within the adjacent left cerebellar hemisphere with extension into the left middle cerebellar peduncle, similar. Associated chronic blood products again noted. 2. Punctate lesion involving the left cerebellar vermis, similar (series 14, image 65), stable. 3. 7 mm cortically based lesion at the posterior right frontal lobe (series 14, image 133). 4. 8 mm lesion at the high posterior left  parietal lobe (series 14, image 148). Smaller and/or resolved lesions: 1. 5 mm lesion at the left occipital lobe (series 14, image 95). 2. Previously seen punctate right occipital lesion is no longer visualized (series 13, image 58 on prior exam). 3. Subcentimeter posterior right temporal lesion no longer seen (series 13, image 70 on prior exam). Larger lesions: 1. 7 mm lesion at the left occipital lobe (series 14, image 101). 2. 10 mm lesion at the left occipital lobe (series 14, image 94). 3. 5 mm lesion at the right occipital lobe (series 14, image 85). 4. Multiple lesions clustered about the posterior falx at the bilateral occipital regions, all of which appear slightly increased in size from prior (series 14, image 80). Largest of these lesions measures up to 1.1 cm at the right occipital lobe. 5. Previously seen 2 adjacent lesions now appears as 1 lesion measuring up to 1 cm at the right temporal lobe (series 14, image 81). 6. 4 mm lesion at the anterior left frontal lobe (series 14, image 107). No definite new lesions on today's exam. Vascular: Major intravascular flow voids are maintained. Skull and upper cervical spine: Craniocervical junction within normal limits. Bone marrow signal intensity within normal limits. No scalp soft tissue abnormality. Sinuses/Orbits: Globes and orbital soft tissues within normal limits. Mucosal thickening with air-fluid level noted within the right maxillary sinus. Trace left mastoid effusion, of doubtful significance. Other: None. IMPRESSION: 1. Multiple intracranial metastases as above. Overall, these lesions are for the most part either  stable or slightly increased in size as compared to prior MRI from 06/01/2023. No definite new lesions on today's exam. 2. Underlying age-related cerebral atrophy with chronic microvascular ischemic disease. 3. Mucosal thickening with air-fluid level within the right maxillary sinus. Clinical correlation for possible acute sinusitis  recommended. Electronically Signed   By: Virgia Griffins M.D.   On: 09/12/2023 04:23   VAS US  LOWER EXTREMITY VENOUS (DVT) Result Date: 09/06/2023  Lower Venous DVT Study Patient Name:  AVANISH CERULLO  Date of Exam:   09/06/2023 Medical Rec #: [id number]          Accession #:    [id number] Date of Birth: 28-Feb-1961          Patient Gender: M Patient Age:   52 years Exam Location:  [address] Procedure:      VAS US  LOWER EXTREMITY VENOUS (DVT) Referring Phys: Dalphine Duet --------------------------------------------------------------------------------  Indications: Patient has left leg sciatica with bilateral calf pain. Patient has lung cancer with mets to the brain. He denies increased SOB and chest pains.  Comparison Study: None Performing Technologist: Alecia Mackin RVT, RDCS (AE), RDMS  Examination Guidelines: A complete evaluation includes B-mode imaging, spectral Doppler, color Doppler, and power Doppler as needed of all accessible portions of each vessel. Bilateral testing is considered an integral part of a complete examination. Limited examinations for reoccurring indications may be performed as noted. The reflux portion of the exam is performed with the patient in reverse Trendelenburg.  +---------+---------------+---------+-----------+----------+--------------+ RIGHT    CompressibilityPhasicitySpontaneityPropertiesThrombus Aging +---------+---------------+---------+-----------+----------+--------------+ CFV      Full           Yes      Yes                                 +---------+---------------+---------+-----------+----------+--------------+ SFJ      Full           Yes      Yes                                 +---------+---------------+---------+-----------+----------+--------------+ FV Prox  Full           Yes      Yes                                 +---------+---------------+---------+-----------+----------+--------------+ FV Mid   Full            Yes      Yes                                 +---------+---------------+---------+-----------+----------+--------------+ FV DistalFull           Yes      Yes                                 +---------+---------------+---------+-----------+----------+--------------+ PFV      Full                                                        +---------+---------------+---------+-----------+----------+--------------+  POP      Full           Yes      Yes                                 +---------+---------------+---------+-----------+----------+--------------+ PTV      Full           Yes      Yes                                 +---------+---------------+---------+-----------+----------+--------------+ PERO     Full           Yes      Yes                                 +---------+---------------+---------+-----------+----------+--------------+ Gastroc  Full                                                        +---------+---------------+---------+-----------+----------+--------------+ GSV      Full                                                        +---------+---------------+---------+-----------+----------+--------------+   +---------+---------------+---------+-----------+----------+--------------+ LEFT     CompressibilityPhasicitySpontaneityPropertiesThrombus Aging +---------+---------------+---------+-----------+----------+--------------+ CFV      Full           Yes      Yes                                 +---------+---------------+---------+-----------+----------+--------------+ SFJ      Full           Yes      Yes                                 +---------+---------------+---------+-----------+----------+--------------+ FV Prox  Full           Yes      Yes                                 +---------+---------------+---------+-----------+----------+--------------+ FV Mid   Full           Yes      Yes                                  +---------+---------------+---------+-----------+----------+--------------+ FV DistalFull           Yes      Yes                                 +---------+---------------+---------+-----------+----------+--------------+ PFV      Full                                                        +---------+---------------+---------+-----------+----------+--------------+  POP      Full           Yes      Yes                                 +---------+---------------+---------+-----------+----------+--------------+ PTV      Full           Yes      Yes                                 +---------+---------------+---------+-----------+----------+--------------+ PERO     Full           Yes      Yes                                 +---------+---------------+---------+-----------+----------+--------------+ Gastroc  Full                                                        +---------+---------------+---------+-----------+----------+--------------+ GSV      Full                                                        +---------+---------------+---------+-----------+----------+--------------+    Findings reported to Dr. Greg Leaks email through [facility] at 8:30 am.  Summary: BILATERAL: - No evidence of deep vein thrombosis seen in the lower extremities, bilaterally. - No evidence of superficial venous thrombosis in the lower extremities, bilaterally. -No evidence of popliteal cyst, bilaterally.   *See table(s) above for measurements and observations. Electronically signed by Angela Kell MD on 09/06/2023 at 1:24:11 PM.    Final    CT Chest W Contrast Result Date: 08/29/2023 CLINICAL DATA:  History of metastatic right lung adenocarcinoma on immunotherapy. Restaging. * Tracking Code: BO * EXAM: CT CHEST WITH CONTRAST TECHNIQUE: Multidetector CT imaging of the chest was performed during intravenous contrast administration. RADIATION DOSE REDUCTION: This exam was performed according to  the departmental dose-optimization program which includes automated exposure control, adjustment of the mA and/or kV according to patient size and/or use of iterative reconstruction technique. CONTRAST:  OMNIPAQUE  IOHEXOL  300 MG/ML  SOLN COMPARISON:  CT chest dated 06/09/2023 FINDINGS: Cardiovascular: Right chest wall port terminates at the superior cavoatrial junction. Normal heart size. Small pericardial effusion. Great vessels are normal in course and caliber. No central pulmonary emboli. Coronary artery calcifications and aortic atherosclerosis. Mediastinum/Nodes: Imaged thyroid  gland without nodules meeting criteria for imaging follow-up by size. Normal esophagus. No pathologically enlarged axillary, supraclavicular, mediastinal, or hilar lymph nodes. Lungs/Pleura: Central airways are patent. Interval slight decrease in size of infrahilar medial right lower lobe 4.0 x 3.3 cm mass (2:46), previously 4.2 x 3.7 cm. Similar mild diffuse bronchial wall thickening with new complete obstruction of right lower lobe bronchus (7:99) resulting in increased right lower lobe atelectasis, which demonstrates heterogeneous hypoenhancement. Increased volume loss of the right lobe middle lobe with increased extension of irregular consolidation and fibrotic changes extending into the posterior right upper lobe and  superior segment right lower lobe. Previously noted basilar right upper lobe subsolid nodule is no longer disc greatly seen and inseparable from the presumed post radiation changes. Interval multifocal new ground-glass and subsolid nodules throughout the bilateral upper and left lower lobe with new irregular patchy consolidation involving the medial left lower lobe. Previously noted new left lower lobe pulmonary nodule is no longer discretely seen. Similar background centrilobular emphysema. No pneumothorax. Increased moderate right pleural effusion. Upper abdomen: Please see separately dictated CT abdomen and  pelvis report for detailed findings. Musculoskeletal: No acute or abnormal lytic or blastic osseous lesions. IMPRESSION: 1. Interval slight decrease in size of infrahilar medial right lower lobe mass. 2. New complete obstruction of right lower lobe bronchus resulting in increased right lower lobe atelectasis, which demonstrates heterogeneous hypoenhancement, raising suspicion for superimposed postobstructive pneumonia. 3. Increased volume loss of the right middle lobe with increased extension of irregular consolidation and fibrotic changes into the posterior right upper lobe and superior segment right lower lobe, favored to reflect post radiation changes. 4. Interval multifocal new ground-glass and subsolid nodules throughout the bilateral upper and left lower lobes with new irregular patchy consolidation involving the medial left lower lobe, likely infectious/inflammatory. 5. Increased moderate right pleural effusion. 6. Aortic Atherosclerosis (ICD10-I70.0) and Emphysema (ICD10-J43.9). Coronary artery calcifications. Assessment for potential risk factor modification, dietary therapy or pharmacologic therapy may be warranted, if clinically indicated. Electronically Signed   By: Limin  Xu M.D.   On: 08/29/2023 13:59   CT ABDOMEN PELVIS W CONTRAST Result Date: 08/23/2023 EXAMINATION: CT ABDOMEN PELVIS W CONTRAST CLINICAL INDICATION: Male, 63 years old. Non-small cell lung cancer (NSCLC), staging TECHNIQUE: Axial CT of the abdomen and pelvis with 100 cc Omnipaque  300 intravenous contrast. Multiplanar reformations provided. Unless otherwise specified, incidental thyroid , adrenal, renal lesions do not require dedicated imaging follow up. Additionally, any mentioned pulmonary nodules do not require dedicated imaging follow-up based on the Fleischner guidelines unless otherwise specified. Coronary calcifications are not identified unless otherwise specified. COMPARISON: 06/09/2023 FINDINGS: Regarding findings of the lung  bases, please refer to the chest report. The liver appears normal. The gallbladder is normal. The spleen is normal. Pancreas is normal. The adrenals are normal. The kidneys are normal other than a small left renal cyst. Abdominal aorta is normal in caliber. Scattered aphthous chronic changes are present. The bladder is normal. The prostate is normal. The appendix is normal. Large and small bowel loops are otherwise within normal limits. No free fluid or pathologic lymphadenopathy by size criteria. No concerning osseous lesions are identified. There are degenerative changes of the spine and bony pelvis. IMPRESSION: No evidence for metastatic disease within the abdomen or pelvis. DOSE REDUCTION: This exam was performed according to our departmental dose-optimization program which includes automated exposure control, adjustment of the mA and/or kV according to patient size and/or use of iterative reconstruction technique. Electronically signed by: Italy Engel MD 08/23/2023 03:51 PM EDT RP Workstation: [id number]    Assessment/Plan Malignant neoplasm metastatic to brain [facility]) [C79.31]  Joseph Atkins is clinically stable today from neurologic standpoint.  MRI brain demonstrates progression of several previous treated lesions, likely c/w treatment effect.  Recommended continuing imaging surveillance only at this time.  Will con't to follow with Dr. Marguerita Shih for Alimta and Keytruda .  We ask that Joseph Atkins return to clinic in 3 months following post-SRS brain MRI, or sooner as needed.  We appreciate the opportunity to participate in the care of Joseph Atkins.   All questions  were answered. The patient knows to call the clinic with any problems, questions or concerns. No barriers to learning were detected.  The total time spent in the encounter was 40 minutes and more than 50% was on counseling and review of test results   Mamie Searles, MD Medical Director of Neuro-Oncology [facility] at [place] 09/14/23 11:10 AM

## 2023-09-17 ENCOUNTER — Other Ambulatory Visit: Payer: Self-pay

## 2023-09-19 ENCOUNTER — Other Ambulatory Visit: Payer: Self-pay | Admitting: Radiation Therapy

## 2023-09-21 ENCOUNTER — Encounter: Payer: Self-pay | Admitting: Internal Medicine

## 2023-09-21 ENCOUNTER — Ambulatory Visit: Admitting: Physician Assistant

## 2023-09-21 ENCOUNTER — Inpatient Hospital Stay

## 2023-09-21 ENCOUNTER — Ambulatory Visit

## 2023-09-21 ENCOUNTER — Other Ambulatory Visit

## 2023-09-21 VITALS — BP 107/64 | HR 107 | Resp 18

## 2023-09-21 DIAGNOSIS — C7931 Secondary malignant neoplasm of brain: Secondary | ICD-10-CM | POA: Diagnosis not present

## 2023-09-21 DIAGNOSIS — Z95828 Presence of other vascular implants and grafts: Secondary | ICD-10-CM

## 2023-09-21 DIAGNOSIS — C3491 Malignant neoplasm of unspecified part of right bronchus or lung: Secondary | ICD-10-CM

## 2023-09-21 LAB — CBC WITH DIFFERENTIAL (CANCER CENTER ONLY)
Abs Immature Granulocytes: 0.03 10*3/uL (ref 0.00–0.07)
Basophils Absolute: 0 10*3/uL (ref 0.0–0.1)
Basophils Relative: 1 %
Eosinophils Absolute: 0.1 10*3/uL (ref 0.0–0.5)
Eosinophils Relative: 3 %
HCT: 23.2 % — ABNORMAL LOW (ref 39.0–52.0)
Hemoglobin: 7.8 g/dL — ABNORMAL LOW (ref 13.0–17.0)
Immature Granulocytes: 1 %
Lymphocytes Relative: 10 %
Lymphs Abs: 0.5 10*3/uL — ABNORMAL LOW (ref 0.7–4.0)
MCH: 30.4 pg (ref 26.0–34.0)
MCHC: 33.6 g/dL (ref 30.0–36.0)
MCV: 90.3 fL (ref 80.0–100.0)
Monocytes Absolute: 0.7 10*3/uL (ref 0.1–1.0)
Monocytes Relative: 16 %
Neutro Abs: 3.1 10*3/uL (ref 1.7–7.7)
Neutrophils Relative %: 69 %
Platelet Count: 108 10*3/uL — ABNORMAL LOW (ref 150–400)
RBC: 2.57 MIL/uL — ABNORMAL LOW (ref 4.22–5.81)
RDW: 17 % — ABNORMAL HIGH (ref 11.5–15.5)
WBC Count: 4.4 10*3/uL (ref 4.0–10.5)
nRBC: 0 % (ref 0.0–0.2)

## 2023-09-21 LAB — CMP (CANCER CENTER ONLY)
ALT: 15 U/L (ref 0–44)
AST: 19 U/L (ref 15–41)
Albumin: 3.3 g/dL — ABNORMAL LOW (ref 3.5–5.0)
Alkaline Phosphatase: 97 U/L (ref 38–126)
Anion gap: 9 (ref 5–15)
BUN: 9 mg/dL (ref 8–23)
CO2: 25 mmol/L (ref 22–32)
Calcium: 8.4 mg/dL — ABNORMAL LOW (ref 8.9–10.3)
Chloride: 92 mmol/L — ABNORMAL LOW (ref 98–111)
Creatinine: 0.95 mg/dL (ref 0.61–1.24)
GFR, Estimated: >60 mL/min (ref >60)
Glucose, Bld: 105 mg/dL — ABNORMAL HIGH (ref 70–99)
Potassium: 3.5 mmol/L (ref 3.5–5.1)
Sodium: 126 mmol/L — ABNORMAL LOW (ref 135–145)
Total Bilirubin: 1.2 mg/dL (ref 0.0–1.2)
Total Protein: 6.7 g/dL (ref 6.5–8.1)

## 2023-09-21 MED ORDER — HEPARIN SOD (PORK) LOCK FLUSH 100 UNIT/ML IV SOLN
500.0000 [IU] | Freq: Once | INTRAVENOUS | Status: AC | PRN
  Administered 2023-09-21: 500 [IU]

## 2023-09-21 MED ORDER — SODIUM CHLORIDE 0.9% FLUSH
10.0000 mL | INTRAVENOUS | Status: DC | PRN
  Administered 2023-09-21: 10 mL

## 2023-09-22 ENCOUNTER — Encounter: Payer: Self-pay | Admitting: Internal Medicine

## 2023-09-22 ENCOUNTER — Inpatient Hospital Stay

## 2023-09-22 ENCOUNTER — Other Ambulatory Visit: Payer: Self-pay

## 2023-09-22 ENCOUNTER — Other Ambulatory Visit: Payer: Self-pay | Admitting: Physician Assistant

## 2023-09-22 ENCOUNTER — Inpatient Hospital Stay (HOSPITAL_BASED_OUTPATIENT_CLINIC_OR_DEPARTMENT_OTHER): Admitting: Physician Assistant

## 2023-09-22 ENCOUNTER — Telehealth: Payer: Self-pay

## 2023-09-22 ENCOUNTER — Ambulatory Visit (HOSPITAL_COMMUNITY)
Admission: RE | Admit: 2023-09-22 | Discharge: 2023-09-22 | Disposition: A | Discharge status: Home / Self Care | Source: Ambulatory Visit | Attending: Physician Assistant | Admitting: Physician Assistant

## 2023-09-22 VITALS — BP 108/66 | HR 106 | Temp 98.9°F | Resp 16

## 2023-09-22 DIAGNOSIS — C3491 Malignant neoplasm of unspecified part of right bronchus or lung: Secondary | ICD-10-CM

## 2023-09-22 DIAGNOSIS — T451X5A Adverse effect of antineoplastic and immunosuppressive drugs, initial encounter: Secondary | ICD-10-CM

## 2023-09-22 DIAGNOSIS — J189 Pneumonia, unspecified organism: Secondary | ICD-10-CM | POA: Diagnosis not present

## 2023-09-22 DIAGNOSIS — R0902 Hypoxemia: Secondary | ICD-10-CM | POA: Diagnosis not present

## 2023-09-22 DIAGNOSIS — D649 Anemia, unspecified: Secondary | ICD-10-CM

## 2023-09-22 DIAGNOSIS — J159 Unspecified bacterial pneumonia: Secondary | ICD-10-CM | POA: Diagnosis not present

## 2023-09-22 DIAGNOSIS — Z95828 Presence of other vascular implants and grafts: Secondary | ICD-10-CM

## 2023-09-22 DIAGNOSIS — D6481 Anemia due to antineoplastic chemotherapy: Secondary | ICD-10-CM

## 2023-09-22 LAB — SAMPLE TO BLOOD BANK

## 2023-09-22 LAB — PREPARE RBC (CROSSMATCH)

## 2023-09-22 MED ORDER — LEVOFLOXACIN 750 MG PO TABS: 750.0000 mg | ORAL_TABLET | Freq: Every day | ORAL | 0 refills | Status: DC

## 2023-09-22 MED ORDER — DIPHENHYDRAMINE HCL 25 MG PO CAPS
25.0000 mg | ORAL_CAPSULE | Freq: Once | ORAL | Status: AC
  Administered 2023-09-22: 25 mg via ORAL
  Filled 2023-09-22: qty 1

## 2023-09-22 MED ORDER — ACETAMINOPHEN 325 MG PO TABS
650.0000 mg | ORAL_TABLET | Freq: Once | ORAL | Status: AC
  Administered 2023-09-22: 650 mg via ORAL
  Filled 2023-09-22: qty 2

## 2023-09-22 MED ORDER — SODIUM CHLORIDE 0.9% FLUSH
10.0000 mL | Freq: Once | INTRAVENOUS | Status: AC
  Administered 2023-09-22: 10 mL

## 2023-09-22 MED ORDER — SODIUM CHLORIDE 0.9% FLUSH
10.0000 mL | INTRAVENOUS | Status: AC | PRN
  Administered 2023-09-22: 10 mL

## 2023-09-22 MED ORDER — SODIUM CHLORIDE 0.9% IV SOLUTION
250.0000 mL | INTRAVENOUS | Status: DC
  Administered 2023-09-22: 100 mL via INTRAVENOUS

## 2023-09-22 MED ORDER — HEPARIN SOD (PORK) LOCK FLUSH 100 UNIT/ML IV SOLN
500.0000 [IU] | Freq: Every day | INTRAVENOUS | Status: AC | PRN
  Administered 2023-09-22: 500 [IU]

## 2023-09-22 NOTE — Telephone Encounter (Addendum)
 Port flush with lab appointment scheduled today to draw blood sample for blood bank at 12:45 PM. Appointment for transfusion of 1 unit of blood set for 2:00 PM. Patient agrees to appointment times.  Patient reported that yesterday he began feeling as if he is developing pneumonia again. He states that coughing and deep breaths cause pain in the right posterior chest area. Informed Polly Brink, PA in [facility], who recommended a chest X-ray and to follow up with the patient during today's infusion.

## 2023-09-22 NOTE — Progress Notes (Signed)
 Per Cassie Heilingoetter, PA ok to run blood at 300cc/hour.

## 2023-09-22 NOTE — Progress Notes (Signed)
 Symptom Management Consult Note [facility]    Patient Care Team: [name], NP as PCP - General (Internal Medicine)    Name / MRN / DOB: Joseph Atkins  [id number]  Aug 14, 1960   Date of visit: 09/22/2023   Chief Complaint/Reason for visit: cough   Current Therapy: Palliative systemic chemotherapy with carboplatin  for an AUC of 5, alimta 500 mg/m, and Keytruda .   Last treatment:  Day 1   Cycle 4 on 09/08/23    ASSESSMENT AND PLAN Patient is a 63 y.o. male with oncologic history of stage IV (T3, N0, M1C) non-small cell lung cancer, adenocarcinoma followed by Dr. Marguerita Shih.  I have viewed most recent oncology note and lab work.  #Stage IV (T3, N0, M1C) non-small cell lung cancer, adenocarcinoma  - Next appointment with oncologist is 09/28/23   #Cough - Chart review shows patient had CT chest on 08/16/23 had  suspicion for superimposed postobstructive pneumonia and an increased right pleural effusion. Possible pneumonia was treat with a course of doxycycline . - Today patient is non toxic appearing. Lung exam reveals rales in left base. No hypoxia, normal work of breathing. - Chest xray PTA showing concern for developing pneumonia with new patchy airspace opacities in the left lung base. CBC from yesterday shows WBC WNL 4.4. - Discussed treatment options of PO antibiotics vs hospital admission given this is second occurrence of pneumonia in a short time. He prefers to try PO antibiotics and manage symptoms at home. Prescription sent to pharmacy for Levaquin .   #Anemia - CBC yesterday showing Hgb 7.8. Here for scheduled blood transfusion today. Denies active bleeding. Likely AE of treatment.   Strict ED precautions discussed should symptoms worsen.   HEME/ONC HISTORY Oncology History  Adenocarcinoma of right lung, stage 4 (HCC)  11/14/2019 Initial Diagnosis   Adenocarcinoma of right lung, stage 4 (HCC)   11/25/2019 - 01/06/2020 Chemotherapy   The  patient had palonosetron  (ALOXI ) injection 0.25 mg, 0.25 mg, Intravenous,  Once, 7 of 7 cycles Administration: 0.25 mg (11/25/2019), 0.25 mg (12/23/2019), 0.25 mg (12/31/2019), 0.25 mg (12/02/2019), 0.25 mg (01/06/2020), 0.25 mg (12/09/2019), 0.25 mg (12/16/2019) CARBOplatin  (PARAPLATIN ) 250 mg in sodium chloride  0.9 % 250 mL chemo infusion, 249.8 mg (100 % of original dose 249.8 mg), Intravenous,  Once, 7 of 7 cycles Dose modification: 249.8 mg (original dose 249.8 mg, Cycle 1) Administration: 250 mg (11/25/2019), 250 mg (12/23/2019), 250 mg (12/31/2019), 250 mg (12/02/2019), 250 mg (01/06/2020), 270 mg (12/09/2019), 250 mg (12/16/2019) PACLitaxel  (TAXOL ) 90 mg in sodium chloride  0.9 % 250 mL chemo infusion (</= 80mg /m2), 45 mg/m2 = 90 mg, Intravenous,  Once, 7 of 7 cycles Administration: 90 mg (11/25/2019), 90 mg (12/23/2019), 90 mg (12/31/2019), 90 mg (12/02/2019), 90 mg (01/06/2020), 90 mg (12/09/2019), 90 mg (12/16/2019)  for chemotherapy treatment.    02/18/2020 - 03/23/2022 Chemotherapy   Patient is on Treatment Plan : LUNG NSCLC Pembrolizumab  (200) q21d     03/31/2020 - 07/21/2020 Chemotherapy    Patient is on Treatment Plan: LUNG NSCLC FLAT DOSE PEMBROLIZUMAB  Q21D      05/19/2020 Cancer Staging   Staging form: Lung, AJCC 8th Edition - Clinical: Stage IVB (cT3, cN0, cM1c) - Signed by Marlene Simas, MD on 05/19/2020   11/16/2022 - 05/25/2023 Chemotherapy   Patient is on Treatment Plan : LUNG NSCLC Pembrolizumab  (200) q21d     06/28/2023 -  Chemotherapy   Patient is on Treatment Plan : LUNG Carboplatin  (5) + Pemetrexed  (500) +  Pembrolizumab  (200) D1 q21d Induction x 4 cycles / Maintenance Pemetrexed  (500) + Pembrolizumab  (200) D1 q21d         INTERVAL HISTORY  Discussed the use of AI scribe software for clinical note transcription with the patient, who gave verbal consent to proceed.    Joseph Atkins is a 63 y.o. male with oncologic history as above presenting to [facility] today with chief complaint of cough. He  presents unaccompanied to clinic today.  He experiences persistent coughing spells, sometimes lasting up to fifteen minutes, primarily in the morning. The cough has persisted for "over a hundred days", and he recently experienced pain that he suspects might be due to a pulled muscle or pneumonia. The new pain he was is what prompted him to call today. He describes his phlegm as thick and white. He experiences chills but no fever and reports wheezing, which is not new. Patient was treated for possible pneumonia with a 7 days course of doxycyline on 08/30/23. After finishing the antibiotic he felt the pneumonia had completely resolved.   ROS  All other systems are reviewed and are negative for acute change except as noted in the HPI.    Allergies  Allergen Reactions   Penicillins Other (See Comments)    Childhood allergy.  Has patient had a PCN reaction causing immediate rash, facial/tongue/throat swelling, SOB or lightheadedness with hypotension: unknown Has patient had a PCN reaction causing severe rash involving mucus membranes or skin necrosis: unknown Has patient had a PCN reaction that required hospitalization: unknown Has patient had a PCN reaction occurring within the last 10 years: no If all of the above answers are "NO", then may proceed with Cephalosporin use.      Past Medical History:  Diagnosis Date   Cancer (HCC)    lung cancer   Diverticulosis    GERD (gastroesophageal reflux disease)    Hx of small bowel obstruction    Hyperlipidemia    Hypertension    Hypothyroidism    IBS (irritable bowel syndrome)    Substance abuse (HCC)    Alcoholic, Drug addition   Thyroid  disease      Past Surgical History:  Procedure Laterality Date   arm surgery Right    BRONCHIAL BRUSHINGS  10/24/2019   Procedure: BRONCHIAL BRUSHINGS;  Surgeon: Denson Flake, MD;  Location: [facility] ENDOSCOPY;  Service: Cardiopulmonary;;  right lower lobe    BRONCHIAL BRUSHINGS  11/05/2019   Procedure:  BRONCHIAL BRUSHINGS;  Surgeon: Denson Flake, MD;  Location: [facility] ENDOSCOPY;  Service: Pulmonary;;   BRONCHIAL NEEDLE ASPIRATION BIOPSY  10/24/2019   Procedure: BRONCHIAL NEEDLE ASPIRATION BIOPSIES;  Surgeon: Denson Flake, MD;  Location: [facility] ENDOSCOPY;  Service: Cardiopulmonary;;   BRONCHIAL NEEDLE ASPIRATION BIOPSY  11/05/2019   Procedure: BRONCHIAL NEEDLE ASPIRATION BIOPSIES;  Surgeon: Denson Flake, MD;  Location: [facility] ENDOSCOPY;  Service: Pulmonary;;   ENDOBRONCHIAL ULTRASOUND N/A 10/24/2019   Procedure: ENDOBRONCHIAL ULTRASOUND;  Surgeon: Denson Flake, MD;  Location: [facility] ENDOSCOPY;  Service: Cardiopulmonary;  Laterality: N/A;   FINGER SURGERY Right    Middle   IR IMAGING GUIDED PORT INSERTION  11/19/2019   VIDEO BRONCHOSCOPY N/A 10/24/2019   Procedure: VIDEO BRONCHOSCOPY WITHOUT FLUORO;  Surgeon: Denson Flake, MD;  Location: [facility] ENDOSCOPY;  Service: Cardiopulmonary;  Laterality: N/A;   VIDEO BRONCHOSCOPY WITH ENDOBRONCHIAL NAVIGATION N/A 11/05/2019   Procedure: VIDEO BRONCHOSCOPY WITH ENDOBRONCHIAL NAVIGATION;  Surgeon: Denson Flake, MD;  Location: [facility] ENDOSCOPY;  Service: Pulmonary;  Laterality: N/A;  Social History   Socioeconomic History   Marital status: Married    Spouse name: Not on file   Number of children: 2   Years of education: Not on file   Highest education level: Not on file  Occupational History   Occupation: supervisor    Employer: [employer]  Tobacco Use   Smoking status: Former    Current packs/day: 1.50    Types: Cigarettes   Smokeless tobacco: Former    Types: Associate Professor status: Never Used  Substance and Sexual Activity   Alcohol use: No    Comment: Alcoholic clean for 6 years   Drug use: No    Comment: Recovering Drug Addict-clean for 6 years   Sexual activity: Not on file  Other Topics Concern   Not on file  Social History Narrative   Not on file   Social Drivers of Health   Financial Resource Strain: Medium Risk  (10/31/2019)   Overall Financial Resource Strain (CARDIA)    Difficulty of Paying Living Expenses: Somewhat hard  Food Insecurity: No Food Insecurity (12/13/2022)   Hunger Vital Sign    Worried About Running Out of Food in the Last Year: Never true    Ran Out of Food in the Last Year: Never true  Transportation Needs: No Transportation Needs (12/13/2022)   PRAPARE - Administrator, Civil Service (Medical): No    Lack of Transportation (Non-Medical): No  Physical Activity: Insufficiently Active (10/31/2019)   Exercise Vital Sign    Days of Exercise per Week: 5 days    Minutes of Exercise per Session: 20 min  Stress: Stress Concern Present (10/31/2019)   [employer] of Occupational Health - Occupational Stress Questionnaire    Feeling of Stress : Very much  Social Connections: Moderately Isolated (10/31/2019)   Social Connection and Isolation Panel [NHANES]    Frequency of Communication with Friends and Family: More than three times a week    Frequency of Social Gatherings with Friends and Family: More than three times a week    Attends Religious Services: Never    Database administrator or Organizations: No    Attends Banker Meetings: Never    Marital Status: Married  Catering manager Violence: Not At Risk (12/13/2022)   Humiliation, Afraid, Rape, and Kick questionnaire    Fear of Current or Ex-Partner: No    Emotionally Abused: No    Physically Abused: No    Sexually Abused: No    Family History  Problem Relation Age of Onset   Epilepsy Mother    Heart disease Mother    Heart disease Brother    Lung cancer Paternal Uncle      Current Outpatient Medications:    levofloxacin  (LEVAQUIN ) 750 MG tablet, Take 1 tablet (750 mg total) by mouth daily for 7 days., Disp: 7 tablet, Rfl: 0   acetaminophen  (TYLENOL ) 500 MG tablet, Take 500 mg by mouth every 6 (six) hours as needed., Disp: , Rfl:    albuterol  (PROVENTIL  HFA;VENTOLIN  HFA) 108 (90 Base) MCG/ACT  inhaler, Inhale 2 puffs into the lungs every 6 (six) hours as needed for wheezing or shortness of breath., Disp: 1 Inhaler, Rfl: 0   amLODipine  (NORVASC ) 5 MG tablet, TAKE 1 TABLET (5 MG TOTAL) BY MOUTH DAILY., Disp: 90 tablet, Rfl: 1   ANORO ELLIPTA 62.5-25 MCG/INH AEPB, 1 puff daily., Disp: , Rfl:    augmented betamethasone dipropionate (DIPROLENE-AF) 0.05 % cream, Apply topically  2 (two) times daily., Disp: , Rfl:    benzonatate  (TESSALON ) 100 MG capsule, Take 1 capsule (100 mg total) by mouth 3 (three) times daily as needed., Disp: 30 capsule, Rfl: 2   [employer] ASPIRIN  LOW DOSE 81 MG tablet, TAKE 1 TABLET (81 MG TOTAL) BY MOUTH DAILY. SWALLOW WHOLE., Disp: 90 tablet, Rfl: 3   cyclobenzaprine  (FLEXERIL ) 10 MG tablet, Take 10 mg by mouth 2 (two) times daily as needed for muscle spasms., Disp: , Rfl:    docusate sodium  (COLACE) 100 MG capsule, Take 100 mg by mouth daily., Disp: , Rfl:    doxycycline  (VIBRA -TABS) 100 MG tablet, Take 1 tablet (100 mg total) by mouth 2 (two) times daily., Disp: 14 tablet, Rfl: 0   DULoxetine  (CYMBALTA ) 20 MG capsule, Take 1 capsule (20 mg total) by mouth 2 (two) times daily., Disp: 60 capsule, Rfl: 5   fluticasone  furoate-vilanterol (BREO ELLIPTA ) 100-25 MCG/INH AEPB, Inhale 1 puff into the lungs daily., Disp: 30 each, Rfl: 3   folic acid  (FOLVITE ) 1 MG tablet, TAKE 1 TABLET BY MOUTH EVERY DAY, Disp: 90 tablet, Rfl: 0   HYDROcodone  bit-homatropine (HYCODAN) 5-1.5 MG/5ML syrup, Take 5 mLs by mouth every 6 (six) hours as needed for cough., Disp: 473 mL, Rfl: 0   hydrOXYzine (ATARAX) 25 MG tablet, Take 25 mg by mouth at bedtime., Disp: , Rfl:    levothyroxine  (SYNTHROID ) 100 MCG tablet, TAKE 1 TABLET BY MOUTH EVERY DAY, Disp: 90 tablet, Rfl: 1   lidocaine -prilocaine  (EMLA ) cream, Apply 1 application topically as needed., Disp: 30 g, Rfl: 0   loratadine (CLARITIN) 10 MG tablet, Take 10 mg by mouth daily., Disp: , Rfl:    LORazepam  (ATIVAN ) 1 MG tablet, Take 1 tablet (1 mg  total) by mouth once as needed for up to 1 dose for anxiety (MRI claustrophobia)., Disp: 3 tablet, Rfl: 0   lumateperone tosylate (CAPLYTA) 10.5 MG capsule, Take 42 mg by mouth daily. For sleep, Disp: , Rfl:    omeprazole  (PRILOSEC) 40 MG capsule, Take 1 capsule (40 mg total) by mouth daily., Disp: 90 capsule, Rfl: 4   ondansetron  (ZOFRAN ) 4 MG tablet, Take 1 tablet (4 mg total) by mouth every 8 (eight) hours as needed., Disp: 40 tablet, Rfl: 2   pembrolizumab  (KEYTRUDA ) 100 MG/4ML SOLN, See admin instructions., Disp: , Rfl:    polyethylene glycol powder (MIRALAX ) powder, Take 17 g by mouth daily., Disp: 255 g, Rfl: 11   prochlorperazine  (COMPAZINE ) 10 MG tablet, Take 1 tablet (10 mg total) by mouth every 6 (six) hours as needed., Disp: 30 tablet, Rfl: 2   temazepam  (RESTORIL ) 30 MG capsule, Take 1 capsule (30 mg total) by mouth at bedtime as needed for sleep., Disp: 30 capsule, Rfl: 0   triamcinolone  cream (KENALOG ) 0.1 %, APPLY TOPICALLY 2 TIMES DAILY AS NEEDED., Disp: 454 g, Rfl: 0   TRULANCE 3 MG TABS, Take 1 tablet by mouth daily., Disp: , Rfl:  No current facility-administered medications for this visit.  Facility-Administered Medications Ordered in Other Visits:    0.9 %  sodium chloride  infusion (Manually program via Guardrails IV Fluids), 250 mL, Intravenous, Continuous, Heilingoetter, Cassandra L, PA-C, Last Rate: 10 mL/hr at 09/22/23 1417, 100 mL at 09/22/23 1417   heparin  lock flush 100 unit/mL, 500 Units, Intracatheter, Daily PRN, Heilingoetter, Cassandra L, PA-C   sodium chloride  flush (NS) 0.9 % injection 10 mL, 10 mL, Intracatheter, PRN, Heilingoetter, Cassandra L, PA-C  PHYSICAL EXAM ECOG FS:1 - Symptomatic but completely ambulatory  Vitals:   09/22/23 1454  BP: 108/66  Pulse: (!) 106  Resp: 16  Temp: 98.9 F (37.2 C)  TempSrc: Oral  SpO2: 92%   Physical Exam Vitals and nursing note reviewed.  Constitutional:      Appearance: He is not ill-appearing or  toxic-appearing.  HENT:     Head: Normocephalic.     Nose: Nose normal.  Eyes:     General: No scleral icterus.    Conjunctiva/sclera: Conjunctivae normal.  Cardiovascular:     Rate and Rhythm: Regular rhythm. Tachycardia present.     Pulses: Normal pulses.     Heart sounds: Normal heart sounds.  Pulmonary:     Effort: Pulmonary effort is normal.     Comments: Rales in left lung base Abdominal:     General: There is no distension.  Musculoskeletal:     Cervical back: Normal range of motion.     Right lower leg: No edema.     Left lower leg: No edema.  Skin:    General: Skin is warm and dry.     Findings: No bruising.  Neurological:     Mental Status: He is alert.        LABORATORY DATA I have reviewed the data as listed    Latest Ref Rng & Units 09/21/2023    2:46 PM 09/14/2023   10:32 AM 09/06/2023   10:52 AM  CBC  WBC 4.0 - 10.5 K/uL 4.4  4.9  10.5   Hemoglobin 13.0 - 17.0 g/dL 7.8  9.0  9.7   Hematocrit 39.0 - 52.0 % 23.2  27.3  29.0   Platelets 150 - 400 K/uL 108  197  345         Latest Ref Rng & Units 09/21/2023    2:46 PM 09/14/2023   10:32 AM 09/06/2023   10:52 AM  CMP  Glucose 70 - 99 mg/dL 409  811  914   BUN 8 - 23 mg/dL 9  8  7    Creatinine 0.61 - 1.24 mg/dL 7.82  9.56  2.13   Sodium 135 - 145 mmol/L 126  130  129   Potassium 3.5 - 5.1 mmol/L 3.5  3.6  3.4   Chloride 98 - 111 mmol/L 92  95  96   CO2 22 - 32 mmol/L 25  26  25    Calcium  8.9 - 10.3 mg/dL 8.4  8.6  8.6   Total Protein 6.5 - 8.1 g/dL 6.7  6.7  6.4   Total Bilirubin 0.0 - 1.2 mg/dL 1.2  0.9  1.0   Alkaline Phos 38 - 126 U/L 97  82  74   AST 15 - 41 U/L 19  20  19    ALT 0 - 44 U/L 15  15  12         RADIOGRAPHIC STUDIES (from last 24 hours if applicable) I have personally reviewed the radiological images as listed and agreed with the findings in the report. DG Chest 2 View Result Date: 09/22/2023 CLINICAL DATA:  Suspected pneumonia - cx pt EXAM: CHEST - 2 VIEW COMPARISON:  August 16, 2023 FINDINGS: Right chest port in place terminating at the cavoatrial junction. Unchanged small to moderate right pleural effusion with right basilar airspace opacities. New airspace opacities in the left lung base. No left pleural effusion or pneumothorax. No cardiomegaly. No acute fracture or destructive lesion. IMPRESSION: 1. New patchy airspace opacities in the left lung base, worrisome for a developing pneumonia. No left-sided  pleural effusion. 2. Unchanged small to moderate right pleural effusion with similar right basilar airspace disease. Electronically Signed   By: Rance Burrows M.D.   On: 09/22/2023 12:26        Visit Diagnosis: 1. Pneumonia due to infectious organism, unspecified laterality, unspecified part of lung   2. Adenocarcinoma of right lung, stage 4 (HCC)   3. Anemia, unspecified type      No orders of the defined types were placed in this encounter.   All questions were answered. The patient knows to call the clinic with any problems, questions or concerns. No barriers to learning was detected.  A total of more than 30 minutes were spent on this encounter with face-to-face time and non-face-to-face time, including preparing to see the patient, ordering tests and/or medications, counseling the patient and coordination of care as outlined above.    Thank you for allowing me to participate in the care of this patient.    Damien Batty E  Walisiewicz, PA-C Department of Hematology/Oncology [facility] at [facility] Phone: [phone]  [phone]    09/22/2023 2:57 PM

## 2023-09-22 NOTE — Telephone Encounter (Signed)
 Tried to reach patient in regards to 1 unit of blood needed as hgb is 7.8. Asked for a return call to approve appt tomorrow at 8 am.

## 2023-09-22 NOTE — Patient Instructions (Signed)

## 2023-09-23 ENCOUNTER — Inpatient Hospital Stay

## 2023-09-25 ENCOUNTER — Inpatient Hospital Stay (HOSPITAL_COMMUNITY)
Admission: EM | Admit: 2023-09-25 | Discharge: 2023-10-01 | DRG: 193 | Disposition: A | Discharge status: Home / Self Care | Attending: Family Medicine | Admitting: Family Medicine

## 2023-09-25 ENCOUNTER — Encounter (HOSPITAL_COMMUNITY): Payer: Self-pay | Admitting: Emergency Medicine

## 2023-09-25 ENCOUNTER — Emergency Department (HOSPITAL_COMMUNITY)

## 2023-09-25 ENCOUNTER — Telehealth: Payer: Self-pay

## 2023-09-25 ENCOUNTER — Other Ambulatory Visit: Payer: Self-pay

## 2023-09-25 ENCOUNTER — Inpatient Hospital Stay (HOSPITAL_COMMUNITY)

## 2023-09-25 DIAGNOSIS — Z79890 Hormone replacement therapy (postmenopausal): Secondary | ICD-10-CM

## 2023-09-25 DIAGNOSIS — Z66 Do not resuscitate: Secondary | ICD-10-CM | POA: Diagnosis present

## 2023-09-25 DIAGNOSIS — J440 Chronic obstructive pulmonary disease with acute lower respiratory infection: Secondary | ICD-10-CM | POA: Diagnosis present

## 2023-09-25 DIAGNOSIS — C3491 Malignant neoplasm of unspecified part of right bronchus or lung: Secondary | ICD-10-CM | POA: Diagnosis present

## 2023-09-25 DIAGNOSIS — I5031 Acute diastolic (congestive) heart failure: Secondary | ICD-10-CM | POA: Diagnosis not present

## 2023-09-25 DIAGNOSIS — R0902 Hypoxemia: Secondary | ICD-10-CM

## 2023-09-25 DIAGNOSIS — Z7189 Other specified counseling: Secondary | ICD-10-CM

## 2023-09-25 DIAGNOSIS — Z7982 Long term (current) use of aspirin: Secondary | ICD-10-CM

## 2023-09-25 DIAGNOSIS — Z820 Family history of epilepsy and other diseases of the nervous system: Secondary | ICD-10-CM | POA: Diagnosis not present

## 2023-09-25 DIAGNOSIS — Z8249 Family history of ischemic heart disease and other diseases of the circulatory system: Secondary | ICD-10-CM

## 2023-09-25 DIAGNOSIS — Z801 Family history of malignant neoplasm of trachea, bronchus and lung: Secondary | ICD-10-CM

## 2023-09-25 DIAGNOSIS — C7931 Secondary malignant neoplasm of brain: Secondary | ICD-10-CM | POA: Diagnosis present

## 2023-09-25 DIAGNOSIS — J9601 Acute respiratory failure with hypoxia: Secondary | ICD-10-CM | POA: Diagnosis present

## 2023-09-25 DIAGNOSIS — E871 Hypo-osmolality and hyponatremia: Secondary | ICD-10-CM | POA: Diagnosis present

## 2023-09-25 DIAGNOSIS — D6481 Anemia due to antineoplastic chemotherapy: Secondary | ICD-10-CM | POA: Diagnosis present

## 2023-09-25 DIAGNOSIS — Z87891 Personal history of nicotine dependence: Secondary | ICD-10-CM | POA: Diagnosis not present

## 2023-09-25 DIAGNOSIS — D649 Anemia, unspecified: Secondary | ICD-10-CM | POA: Diagnosis not present

## 2023-09-25 DIAGNOSIS — E785 Hyperlipidemia, unspecified: Secondary | ICD-10-CM | POA: Diagnosis present

## 2023-09-25 DIAGNOSIS — F419 Anxiety disorder, unspecified: Secondary | ICD-10-CM | POA: Diagnosis present

## 2023-09-25 DIAGNOSIS — J159 Unspecified bacterial pneumonia: Secondary | ICD-10-CM | POA: Diagnosis present

## 2023-09-25 DIAGNOSIS — R509 Fever, unspecified: Secondary | ICD-10-CM

## 2023-09-25 DIAGNOSIS — Z515 Encounter for palliative care: Secondary | ICD-10-CM

## 2023-09-25 DIAGNOSIS — T451X5A Adverse effect of antineoplastic and immunosuppressive drugs, initial encounter: Secondary | ICD-10-CM | POA: Diagnosis present

## 2023-09-25 DIAGNOSIS — I959 Hypotension, unspecified: Secondary | ICD-10-CM | POA: Diagnosis present

## 2023-09-25 DIAGNOSIS — K219 Gastro-esophageal reflux disease without esophagitis: Secondary | ICD-10-CM | POA: Diagnosis present

## 2023-09-25 DIAGNOSIS — Z5986 Financial insecurity: Secondary | ICD-10-CM

## 2023-09-25 DIAGNOSIS — D630 Anemia in neoplastic disease: Secondary | ICD-10-CM | POA: Diagnosis present

## 2023-09-25 DIAGNOSIS — J918 Pleural effusion in other conditions classified elsewhere: Secondary | ICD-10-CM | POA: Diagnosis present

## 2023-09-25 DIAGNOSIS — E039 Hypothyroidism, unspecified: Secondary | ICD-10-CM | POA: Diagnosis present

## 2023-09-25 DIAGNOSIS — Z880 Allergy status to penicillin: Secondary | ICD-10-CM

## 2023-09-25 DIAGNOSIS — Z79899 Other long term (current) drug therapy: Secondary | ICD-10-CM

## 2023-09-25 DIAGNOSIS — J441 Chronic obstructive pulmonary disease with (acute) exacerbation: Secondary | ICD-10-CM | POA: Diagnosis present

## 2023-09-25 DIAGNOSIS — F32A Depression, unspecified: Secondary | ICD-10-CM | POA: Diagnosis present

## 2023-09-25 DIAGNOSIS — J189 Pneumonia, unspecified organism: Secondary | ICD-10-CM | POA: Diagnosis not present

## 2023-09-25 DIAGNOSIS — I10 Essential (primary) hypertension: Secondary | ICD-10-CM | POA: Diagnosis present

## 2023-09-25 DIAGNOSIS — Z1152 Encounter for screening for COVID-19: Secondary | ICD-10-CM

## 2023-09-25 DIAGNOSIS — E8721 Acute metabolic acidosis: Secondary | ICD-10-CM | POA: Diagnosis present

## 2023-09-25 DIAGNOSIS — R079 Chest pain, unspecified: Secondary | ICD-10-CM | POA: Diagnosis not present

## 2023-09-25 DIAGNOSIS — Z7951 Long term (current) use of inhaled steroids: Secondary | ICD-10-CM

## 2023-09-25 LAB — CBC WITH DIFFERENTIAL/PLATELET
Abs Immature Granulocytes: 0.16 10*3/uL — ABNORMAL HIGH (ref 0.00–0.07)
Basophils Absolute: 0 10*3/uL (ref 0.0–0.1)
Basophils Relative: 0 %
Eosinophils Absolute: 0 10*3/uL (ref 0.0–0.5)
Eosinophils Relative: 1 %
HCT: 31.1 % — ABNORMAL LOW (ref 39.0–52.0)
Hemoglobin: 10 g/dL — ABNORMAL LOW (ref 13.0–17.0)
Immature Granulocytes: 2 %
Lymphocytes Relative: 4 %
Lymphs Abs: 0.4 10*3/uL — ABNORMAL LOW (ref 0.7–4.0)
MCH: 30.4 pg (ref 26.0–34.0)
MCHC: 32.2 g/dL (ref 30.0–36.0)
MCV: 94.5 fL (ref 80.0–100.0)
Monocytes Absolute: 0.9 10*3/uL (ref 0.1–1.0)
Monocytes Relative: 10 %
Neutro Abs: 7.2 10*3/uL (ref 1.7–7.7)
Neutrophils Relative %: 83 %
Platelets: 188 10*3/uL (ref 150–400)
RBC: 3.29 MIL/uL — ABNORMAL LOW (ref 4.22–5.81)
RDW: 17.1 % — ABNORMAL HIGH (ref 11.5–15.5)
WBC: 8.6 10*3/uL (ref 4.0–10.5)
nRBC: 0 % (ref 0.0–0.2)

## 2023-09-25 LAB — URINALYSIS, W/ REFLEX TO CULTURE (INFECTION SUSPECTED)
Bacteria, UA: NONE SEEN
Bilirubin Urine: NEGATIVE
Glucose, UA: NEGATIVE mg/dL
Hgb urine dipstick: NEGATIVE
Ketones, ur: NEGATIVE mg/dL
Leukocytes,Ua: NEGATIVE
Nitrite: NEGATIVE
Protein, ur: NEGATIVE mg/dL
Specific Gravity, Urine: 1.008 (ref 1.005–1.030)
pH: 6 (ref 5.0–8.0)

## 2023-09-25 LAB — BPAM RBC
Blood Product Expiration Date: 202507042359
ISSUE DATE / TIME: 202505301456
Unit Type and Rh: 5100

## 2023-09-25 LAB — MRSA NEXT GEN BY PCR, NASAL: MRSA by PCR Next Gen: NOT DETECTED

## 2023-09-25 LAB — COMPREHENSIVE METABOLIC PANEL WITH GFR
ALT: 17 U/L (ref 0–44)
AST: 37 U/L (ref 15–41)
Albumin: 2.5 g/dL — ABNORMAL LOW (ref 3.5–5.0)
Alkaline Phosphatase: 99 U/L (ref 38–126)
Anion gap: 16 — ABNORMAL HIGH (ref 5–15)
BUN: 8 mg/dL (ref 8–23)
CO2: 18 mmol/L — ABNORMAL LOW (ref 22–32)
Calcium: 8.2 mg/dL — ABNORMAL LOW (ref 8.9–10.3)
Chloride: 92 mmol/L — ABNORMAL LOW (ref 98–111)
Creatinine, Ser: 0.94 mg/dL (ref 0.61–1.24)
GFR, Estimated: >60 mL/min (ref >60)
Glucose, Bld: 118 mg/dL — ABNORMAL HIGH (ref 70–99)
Potassium: 3.8 mmol/L (ref 3.5–5.1)
Sodium: 126 mmol/L — ABNORMAL LOW (ref 135–145)
Total Bilirubin: 1.9 mg/dL — ABNORMAL HIGH (ref 0.0–1.2)
Total Protein: 6.6 g/dL (ref 6.5–8.1)

## 2023-09-25 LAB — TYPE AND SCREEN
ABO/RH(D): O POS
Antibody Screen: POSITIVE
Donor AG Type: NEGATIVE
Unit division: 0

## 2023-09-25 LAB — RESP PANEL BY RT-PCR (RSV, FLU A&B, COVID)  RVPGX2
Influenza A by PCR: NEGATIVE
Influenza B by PCR: NEGATIVE
Resp Syncytial Virus by PCR: NEGATIVE
SARS Coronavirus 2 by RT PCR: NEGATIVE

## 2023-09-25 LAB — PROTIME-INR
INR: 1.4 — ABNORMAL HIGH (ref 0.8–1.2)
Prothrombin Time: 17.1 s — ABNORMAL HIGH (ref 11.4–15.2)

## 2023-09-25 LAB — LACTIC ACID, PLASMA
Lactic Acid, Venous: 1.6 mmol/L (ref 0.5–1.9)
Lactic Acid, Venous: 1.8 mmol/L (ref 0.5–1.9)

## 2023-09-25 LAB — BRAIN NATRIURETIC PEPTIDE: B Natriuretic Peptide: 95 pg/mL (ref 0.0–100.0)

## 2023-09-25 MED ORDER — HYDROCODONE BIT-HOMATROP MBR 5-1.5 MG/5ML PO SOLN
5.0000 mL | Freq: Four times a day (QID) | ORAL | Status: DC | PRN
  Filled 2023-09-25: qty 5

## 2023-09-25 MED ORDER — SODIUM CHLORIDE 0.9% FLUSH
3.0000 mL | Freq: Two times a day (BID) | INTRAVENOUS | Status: DC
  Administered 2023-09-25 – 2023-09-27 (×5): 3 mL via INTRAVENOUS

## 2023-09-25 MED ORDER — ASPIRIN 81 MG PO TBEC
81.0000 mg | DELAYED_RELEASE_TABLET | Freq: Every day | ORAL | Status: DC
  Administered 2023-09-26 – 2023-10-01 (×6): 81 mg via ORAL
  Filled 2023-09-25 (×6): qty 1

## 2023-09-25 MED ORDER — FLUTICASONE FUROATE-VILANTEROL 100-25 MCG/ACT IN AEPB
1.0000 | INHALATION_SPRAY | Freq: Every day | RESPIRATORY_TRACT | Status: DC
  Filled 2023-09-25 (×2): qty 28

## 2023-09-25 MED ORDER — PANTOPRAZOLE SODIUM 40 MG PO TBEC
40.0000 mg | DELAYED_RELEASE_TABLET | Freq: Every day | ORAL | Status: DC
  Administered 2023-09-26 – 2023-09-29 (×4): 40 mg via ORAL
  Filled 2023-09-25 (×6): qty 1

## 2023-09-25 MED ORDER — UMECLIDINIUM-VILANTEROL 62.5-25 MCG/ACT IN AEPB: 1.0000 | INHALATION_SPRAY | Freq: Every day | RESPIRATORY_TRACT | Status: DC

## 2023-09-25 MED ORDER — IPRATROPIUM-ALBUTEROL 0.5-2.5 (3) MG/3ML IN SOLN
3.0000 mL | Freq: Four times a day (QID) | RESPIRATORY_TRACT | Status: DC
  Administered 2023-09-25 – 2023-09-27 (×7): 3 mL via RESPIRATORY_TRACT
  Filled 2023-09-25 (×8): qty 3

## 2023-09-25 MED ORDER — ACETAMINOPHEN 325 MG PO TABS: 650.0000 mg | ORAL_TABLET | Freq: Four times a day (QID) | ORAL | Status: DC | PRN

## 2023-09-25 MED ORDER — BISACODYL 10 MG RE SUPP: 10.0000 mg | Freq: Every day | RECTAL | Status: DC | PRN

## 2023-09-25 MED ORDER — LACTATED RINGERS IV SOLN: INTRAVENOUS | Status: AC

## 2023-09-25 MED ORDER — HYDROXYZINE HCL 25 MG PO TABS
25.0000 mg | ORAL_TABLET | Freq: Every day | ORAL | Status: DC
  Administered 2023-09-25 – 2023-09-30 (×6): 25 mg via ORAL
  Filled 2023-09-25 (×6): qty 1

## 2023-09-25 MED ORDER — POLYETHYLENE GLYCOL 3350 17 GM/SCOOP PO POWD
17.0000 g | Freq: Every day | ORAL | Status: DC
  Administered 2023-09-27 – 2023-09-30 (×2): 17 g via ORAL

## 2023-09-25 MED ORDER — DULOXETINE HCL 20 MG PO CPEP
20.0000 mg | ORAL_CAPSULE | Freq: Two times a day (BID) | ORAL | Status: DC
  Administered 2023-09-25 – 2023-10-01 (×12): 20 mg via ORAL
  Filled 2023-09-25 (×15): qty 1

## 2023-09-25 MED ORDER — LEVOTHYROXINE SODIUM 100 MCG PO TABS
100.0000 ug | ORAL_TABLET | Freq: Every day | ORAL | Status: DC
  Administered 2023-09-26 – 2023-10-01 (×6): 100 ug via ORAL
  Filled 2023-09-25 (×6): qty 1

## 2023-09-25 MED ORDER — ONDANSETRON HCL 4 MG PO TABS: 4.0000 mg | ORAL_TABLET | Freq: Four times a day (QID) | ORAL | Status: DC | PRN

## 2023-09-25 MED ORDER — SODIUM CHLORIDE 0.9 % IV SOLN
500.0000 mg | INTRAVENOUS | Status: DC
  Administered 2023-09-26 – 2023-09-27 (×2): 500 mg via INTRAVENOUS
  Filled 2023-09-25 (×2): qty 5

## 2023-09-25 MED ORDER — IOHEXOL 350 MG/ML SOLN
75.0000 mL | Freq: Once | INTRAVENOUS | Status: AC | PRN
  Administered 2023-09-25: 75 mL via INTRAVENOUS

## 2023-09-25 MED ORDER — POLYETHYLENE GLYCOL 3350 17 G PO PACK
17.0000 g | PACK | Freq: Every day | ORAL | Status: DC | PRN
  Administered 2023-09-26 – 2023-09-27 (×2): 17 g via ORAL
  Filled 2023-09-25 (×3): qty 1

## 2023-09-25 MED ORDER — AMLODIPINE BESYLATE 5 MG PO TABS
5.0000 mg | ORAL_TABLET | Freq: Every day | ORAL | Status: DC
  Administered 2023-09-26 – 2023-09-27 (×2): 5 mg via ORAL
  Filled 2023-09-25 (×2): qty 1

## 2023-09-25 MED ORDER — TEMAZEPAM 15 MG PO CAPS
30.0000 mg | ORAL_CAPSULE | Freq: Every evening | ORAL | Status: DC | PRN
  Administered 2023-09-25 – 2023-09-28 (×3): 30 mg via ORAL
  Filled 2023-09-25: qty 2
  Filled 2023-09-25 (×2): qty 4

## 2023-09-25 MED ORDER — ONDANSETRON HCL 4 MG/2ML IJ SOLN
4.0000 mg | Freq: Four times a day (QID) | INTRAMUSCULAR | Status: DC | PRN
Start: 2023-09-25 — End: 2023-10-01

## 2023-09-25 MED ORDER — DM-GUAIFENESIN ER 30-600 MG PO TB12
1.0000 | ORAL_TABLET | Freq: Two times a day (BID) | ORAL | Status: DC
  Administered 2023-09-25 – 2023-10-01 (×13): 1 via ORAL
  Filled 2023-09-25 (×13): qty 1

## 2023-09-25 MED ORDER — HEPARIN SODIUM (PORCINE) 5000 UNIT/ML IJ SOLN
5000.0000 [IU] | Freq: Three times a day (TID) | INTRAMUSCULAR | Status: DC
  Administered 2023-09-25 – 2023-09-30 (×14): 5000 [IU] via SUBCUTANEOUS
  Filled 2023-09-25 (×14): qty 1

## 2023-09-25 MED ORDER — FOLIC ACID 1 MG PO TABS
1.0000 mg | ORAL_TABLET | Freq: Every day | ORAL | Status: DC
  Administered 2023-09-26 – 2023-10-01 (×6): 1 mg via ORAL
  Filled 2023-09-25 (×7): qty 1

## 2023-09-25 MED ORDER — SODIUM CHLORIDE 0.9% FLUSH: 3.0000 mL | INTRAVENOUS | Status: DC | PRN

## 2023-09-25 MED ORDER — SODIUM CHLORIDE 0.9 % IV SOLN
2.0000 g | INTRAVENOUS | Status: DC
  Administered 2023-09-26 – 2023-09-28 (×3): 2 g via INTRAVENOUS
  Filled 2023-09-25 (×3): qty 20

## 2023-09-25 MED ORDER — LUMATEPERONE TOSYLATE 42 MG PO CAPS
42.0000 mg | ORAL_CAPSULE | Freq: Every day | ORAL | Status: DC
  Filled 2023-09-25 (×2): qty 1

## 2023-09-25 MED ORDER — SODIUM CHLORIDE 0.9 % IV SOLN
1.0000 g | Freq: Once | INTRAVENOUS | Status: AC
  Administered 2023-09-25: 1 g via INTRAVENOUS
  Filled 2023-09-25: qty 10

## 2023-09-25 MED ORDER — SODIUM CHLORIDE 0.9% FLUSH
3.0000 mL | Freq: Two times a day (BID) | INTRAVENOUS | Status: DC
  Administered 2023-09-25 – 2023-09-30 (×11): 3 mL via INTRAVENOUS

## 2023-09-25 MED ORDER — SENNOSIDES-DOCUSATE SODIUM 8.6-50 MG PO TABS
2.0000 | ORAL_TABLET | Freq: Every day | ORAL | Status: DC
  Administered 2023-09-25 – 2023-09-28 (×4): 2 via ORAL
  Filled 2023-09-25 (×5): qty 2

## 2023-09-25 MED ORDER — LACTATED RINGERS IV BOLUS (SEPSIS)
1000.0000 mL | Freq: Once | INTRAVENOUS | Status: AC
  Administered 2023-09-25: 1000 mL via INTRAVENOUS

## 2023-09-25 MED ORDER — METHYLPREDNISOLONE SODIUM SUCC 40 MG IJ SOLR
40.0000 mg | Freq: Two times a day (BID) | INTRAMUSCULAR | Status: AC
  Administered 2023-09-25 – 2023-09-27 (×5): 40 mg via INTRAVENOUS
  Filled 2023-09-25 (×5): qty 1

## 2023-09-25 MED ORDER — ACETAMINOPHEN 650 MG RE SUPP: 650.0000 mg | Freq: Four times a day (QID) | RECTAL | Status: DC | PRN

## 2023-09-25 MED ORDER — ALBUTEROL SULFATE (2.5 MG/3ML) 0.083% IN NEBU: 2.5000 mg | INHALATION_SOLUTION | RESPIRATORY_TRACT | Status: DC | PRN

## 2023-09-25 MED ORDER — SODIUM CHLORIDE 0.9 % IV SOLN: INTRAVENOUS | Status: AC | PRN

## 2023-09-25 MED ORDER — SODIUM CHLORIDE 0.9 % IV SOLN
500.0000 mg | Freq: Once | INTRAVENOUS | Status: AC
  Administered 2023-09-25: 500 mg via INTRAVENOUS
  Filled 2023-09-25: qty 5

## 2023-09-25 NOTE — ED Provider Notes (Signed)
 [place] EMERGENCY DEPARTMENT AT [facility] Provider Note   CSN: [id number] Arrival date & time: 09/25/23  4098     History  Chief Complaint  Patient presents with   Shortness of Breath    Joseph Atkins is a 63 y.o. male.  Pt complains of shortness of breath.  Pt was diagnosed with Pneumonia on Friday.  Pt in on Levaquin .  Pt reports he has had increasing shortness of breath.  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  Pt being treated by Dr. Liam Redhead.  Pt reports he is more short of breath today.  Pt has had a fever.  Pt complains of bodyaches.  No nausea or vomiting.    The history is provided by the patient and the spouse. No language interpreter was used.  Shortness of Breath Severity:  Moderate Duration:  3 days Timing:  Constant Progression:  Worsening Relieved by:  Nothing Worsened by:  Nothing Associated symptoms: fever        Home Medications Prior to Admission medications   Medication Sig Start Date End Date Taking? Authorizing Provider  acetaminophen  (TYLENOL ) 500 MG tablet Take 500 mg by mouth every 6 (six) hours as needed.    [provider]  albuterol  (PROVENTIL  HFA;VENTOLIN  HFA) 108 (90 Base) MCG/ACT inhaler Inhale 2 puffs into the lungs every 6 (six) hours as needed for wheezing or shortness of breath. 02/07/17   Dettinger, Lucio Sabin, MD  amLODipine  (NORVASC ) 5 MG tablet TAKE 1 TABLET (5 MG TOTAL) BY MOUTH DAILY. 05/29/23   Vaslow, Zachary K, MD  ANORO ELLIPTA 62.5-25 MCG/INH AEPB 1 puff daily. 11/26/19   [provider]  augmented betamethasone dipropionate (DIPROLENE-AF) 0.05 % cream Apply topically 2 (two) times daily. 12/15/21   [provider]  benzonatate  (TESSALON ) 100 MG capsule Take 1 capsule (100 mg total) by mouth 3 (three) times daily as needed. 01/18/23   Heilingoetter, Cassandra L, PA-C  [employer] ASPIRIN  LOW DOSE 81 MG tablet TAKE 1 TABLET (81 MG TOTAL) BY MOUTH DAILY. SWALLOW WHOLE. 01/03/22   Vaslow,  Zachary K, MD  cyclobenzaprine  (FLEXERIL ) 10 MG tablet Take 10 mg by mouth 2 (two) times daily as needed for muscle spasms. 10/15/19   [provider]  docusate sodium  (COLACE) 100 MG capsule Take 100 mg by mouth daily.    [provider]  doxycycline  (VIBRA -TABS) 100 MG tablet Take 1 tablet (100 mg total) by mouth 2 (two) times daily. 08/30/23   Heilingoetter, Cassandra L, PA-C  DULoxetine  (CYMBALTA ) 20 MG capsule Take 1 capsule (20 mg total) by mouth 2 (two) times daily. 02/18/20   Tanner, Van E., PA-C  fluticasone  furoate-vilanterol (BREO ELLIPTA ) 100-25 MCG/INH AEPB Inhale 1 puff into the lungs daily. 05/25/17   Dettinger, Lucio Sabin, MD  folic acid  (FOLVITE ) 1 MG tablet TAKE 1 TABLET BY MOUTH EVERY DAY 06/30/23   Heilingoetter, Cassandra L, PA-C  HYDROcodone  bit-homatropine (HYCODAN) 5-1.5 MG/5ML syrup Take 5 mLs by mouth every 6 (six) hours as needed for cough. 03/07/23   Heilingoetter, Cassandra L, PA-C  hydrOXYzine (ATARAX) 25 MG tablet Take 25 mg by mouth at bedtime. 12/15/21   [provider]  levofloxacin  (LEVAQUIN ) 750 MG tablet Take 1 tablet (750 mg total) by mouth daily for 7 days. 09/22/23 09/29/23  Walisiewicz, Kaitlyn E, PA-C  levothyroxine  (SYNTHROID ) 100 MCG tablet TAKE 1 TABLET BY MOUTH EVERY DAY 08/14/23   Heilingoetter, Cassandra L, PA-C  lidocaine -prilocaine  (EMLA ) cream Apply 1 application topically as needed. 11/11/19  Heilingoetter, Cassandra L, PA-C  loratadine (CLARITIN) 10 MG tablet Take 10 mg by mouth daily.    [provider]  LORazepam  (ATIVAN ) 1 MG tablet Take 1 tablet (1 mg total) by mouth once as needed for up to 1 dose for anxiety (MRI claustrophobia). 09/14/23   Vaslow, Zachary K, MD  lumateperone tosylate (CAPLYTA) 10.5 MG capsule Take 42 mg by mouth daily. For sleep    [provider]  omeprazole  (PRILOSEC) 40 MG capsule Take 1 capsule (40 mg total) by mouth daily. 02/07/17   Dettinger, Lucio Sabin, MD  ondansetron  (ZOFRAN ) 4 MG  tablet Take 1 tablet (4 mg total) by mouth every 8 (eight) hours as needed. 02/21/20   Tanner, Van E., PA-C  pembrolizumab  (KEYTRUDA ) 100 MG/4ML SOLN See admin instructions.    [provider]  polyethylene glycol powder (MIRALAX ) powder Take 17 g by mouth daily. 05/09/14   Doc Freed, FNP  prochlorperazine  (COMPAZINE ) 10 MG tablet Take 1 tablet (10 mg total) by mouth every 6 (six) hours as needed. 06/21/23   Heilingoetter, Cassandra L, PA-C  temazepam  (RESTORIL ) 30 MG capsule Take 1 capsule (30 mg total) by mouth at bedtime as needed for sleep. 11/07/22   Marlene Simas, MD  triamcinolone  cream (KENALOG ) 0.1 % APPLY TOPICALLY 2 TIMES DAILY AS NEEDED. 01/27/23   Heilingoetter, Cassandra L, PA-C  TRULANCE 3 MG TABS Take 1 tablet by mouth daily. 09/06/19   [provider]      Allergies    Penicillins    Review of Systems   Review of Systems  Constitutional:  Positive for chills and fever.  Respiratory:  Positive for shortness of breath.   All other systems reviewed and are negative.   Physical Exam Updated Vital Signs BP 107/77   Pulse (!) 114   Temp (!) 97.5 F (36.4 C) (Oral)   Resp (!) 35   Ht 5\' 8"  (1.727 m)   Wt 75.8 kg   SpO2 97%   BMI 25.39 kg/m  Physical Exam Vitals and nursing note reviewed.  Constitutional:      Appearance: He is well-developed.  HENT:     Head: Normocephalic.  Cardiovascular:     Rate and Rhythm: Normal rate and regular rhythm.  Pulmonary:     Effort: Pulmonary effort is normal.     Breath sounds: Decreased breath sounds and rhonchi present.  Abdominal:     General: There is no distension.  Musculoskeletal:        General: Normal range of motion.     Cervical back: Normal range of motion.  Skin:    General: Skin is warm.  Neurological:     General: No focal deficit present.     Mental Status: He is alert and oriented to person, place, and time.  Psychiatric:        Mood and Affect: Mood is anxious.     ED  Results / Procedures / Treatments   Labs (all labs ordered are listed, but only abnormal results are displayed) Labs Reviewed  COMPREHENSIVE METABOLIC PANEL WITH GFR - Abnormal; Notable for the following components:      Result Value   Sodium 126 (*)    Chloride 92 (*)    CO2 18 (*)    Glucose, Bld 118 (*)    Calcium  8.2 (*)    Albumin 2.5 (*)    Total Bilirubin 1.9 (*)    Anion gap 16 (*)    All other components within normal  limits  CBC WITH DIFFERENTIAL/PLATELET - Abnormal; Notable for the following components:   RBC 3.29 (*)    Hemoglobin 10.0 (*)    HCT 31.1 (*)    RDW 17.1 (*)    Lymphs Abs 0.4 (*)    Abs Immature Granulocytes 0.16 (*)    All other components within normal limits  PROTIME-INR - Abnormal; Notable for the following components:   Prothrombin Time 17.1 (*)    INR 1.4 (*)    All other components within normal limits  RESP PANEL BY RT-PCR (RSV, FLU A&B, COVID)  [id number]  CULTURE, BLOOD (ROUTINE X 2)  CULTURE, BLOOD (ROUTINE X 2)  LACTIC ACID, PLASMA  LACTIC ACID, PLASMA  URINALYSIS, W/ REFLEX TO CULTURE (INFECTION SUSPECTED)  LEGIONELLA PNEUMOPHILA SEROGP 1 UR AG  BRAIN NATRIURETIC PEPTIDE    EKG None  Radiology DG Chest Port 1 View Result Date: 09/25/2023 CLINICAL DATA:  Questionable sepsis EXAM: PORTABLE CHEST 1 VIEW COMPARISON:  Sep 22, 2023 FINDINGS: Persistent unchanged bilateral basilar infiltrates atelectasis and right pleural effusion and reaction. No change in the right IJ Infusaport catheter The heart normal size IMPRESSION: Persistent unchanged bilateral basilar infiltrates atelectasis and right pleural effusion and reaction. Electronically Signed   By: Fredrich Jefferson M.D.   On: 09/25/2023 10:34    Procedures .Critical Care  Performed by: Sandi Crosby, PA-C Authorized by: Sandi Crosby, PA-C   Critical care provider statement:    Critical care time (minutes):  45   Critical care start time:  09/25/2023 9:00 AM   Critical care end time:   09/25/2023 11:15 AM   Critical care time was exclusive of:  Separately billable procedures and treating other patients and teaching time   Critical care was necessary to treat or prevent imminent or life-threatening deterioration of the following conditions:  Circulatory failure, respiratory failure and sepsis   Critical care was time spent personally by me on the following activities:  Development of treatment plan with patient or surrogate, discussions with consultants, evaluation of patient's response to treatment, ordering and performing treatments and interventions, ordering and review of laboratory studies, ordering and review of radiographic studies, pulse oximetry and re-evaluation of patient's condition   Care discussed with: admitting provider       Medications Ordered in ED Medications  lactated ringers  infusion (has no administration in time range)  lactated ringers  bolus 1,000 mL (1,000 mLs Intravenous New Bag/Given 09/25/23 1032)  azithromycin  (ZITHROMAX ) 500 mg in sodium chloride  0.9 % 250 mL IVPB (500 mg Intravenous New Bag/Given 09/25/23 1117)  cefTRIAXone (ROCEPHIN) 1 g in sodium chloride  0.9 % 100 mL IVPB (0 g Intravenous Stopped 09/25/23 1117)    ED Course/ Medical Decision Making/ A&P                                 Medical Decision Making Pt recently diagnosed with pneumonia  Amount and/or Complexity of Data Reviewed Independent Historian:     Details: Pt is here with his spouse who is supportive  Labs: ordered. Decision-making details documented in ED Course.    Details: Labs ordered reviewed and interpreted.  Lactic acid is 2.0  Radiology: ordered and independent interpretation performed. Decision-making details documented in ED Course.    Details: Chest xray ordered reviewed and interpreted.   Chest xray shows bilat bibasilar infiltrates  Discussion of management or test interpretation with external provider(s): Dr. Quintella Buck Hospitalist will admit  Risk Prescription  drug management. Decision regarding hospitalization. Risk Details: Patient complains of shortness of breath.  Patient complains of increasing shortness of breath.  He reports that he was diagnosed with pneumonia on Friday but chose to go home.  Patient states today he is having increased difficulty breathing.  His oxygen  saturations were in the 80s when he arrived.  Respiratory therapy has assessed patient.  He is now satting 95% on 14 L.  Patient reports that he is breathing better.  Patient given IV antibiotics Zithromax  and Rocephin.  Critical Care Total time providing critical care: 45 minutes    Respiratory therapy evaluate pt.  Pt up to 14 liters 02.  Pt breathing better.  02 sats 94-95          Final Clinical Impression(s) / ED Diagnoses Final diagnoses:  Pneumonia of both lungs due to infectious organism, unspecified part of lung  Hypoxia    Rx / DC Orders ED Discharge Orders     None         Evelyn Hire 09/25/23 1401    Iva Mariner, MD 09/25/23 1601

## 2023-09-25 NOTE — Consult Note (Signed)
 CODE SEPSIS - PHARMACY COMMUNICATION  **Broad-spectrum antimicrobials should be administered within one hour of sepsis diagnosis**  Time Code Sepsis call or page was received: 0940  Antibiotics ordered: Ceftriaxone, Azithromycin   Time of first antibiotic administration: 1025  Additional action taken by pharmacy: Messaged RN  If necessary, name of provider/nurse contacted: Emilee    Will M. Alva Jewels, PharmD Clinical Pharmacist 09/25/2023 9:44 AM

## 2023-09-25 NOTE — ED Notes (Signed)
 RT at bedside.

## 2023-09-25 NOTE — Progress Notes (Signed)
 Incentive Spirometer given to patient, instructions on usage given.  Patient was able to achieve X5, but had to stop due to [facility].

## 2023-09-25 NOTE — Telephone Encounter (Signed)
 Spoke with patient this morning. He reported that [place], [place] in [facility], attempted to have him go to the ER on Friday during his visit to [facility], but the patient declined.   The patient stated, "I can't breathe, I hurt all over my body, and this is the worst weekend I've had in a long time." Informed patient of the importance of being evaluated in the ER. The patient voiced understanding.

## 2023-09-25 NOTE — ED Notes (Signed)
 ED TO INPATIENT HANDOFF REPORT  ED Nurse Name and Phone #: Leanna Promise RN [phone]  S Name/Age/Gender Joseph Atkins 63 y.o. male Room/Bed: APA01/APA01  Code Status   Code Status: Prior  Home/SNF/Other Home Patient oriented to: self, place, time, and situation Is this baseline? Yes   Triage Complete: Triage complete  Chief Complaint Acute respiratory failure with hypoxia (HCC) [J96.01]  Triage Note Pt reports he is a cancer pt with c/o SOB since last week, pt was diagnosed with pneumonia on Fri, no home O2, room air sat 72%, ED provider to bedside upon room arrival    Allergies Allergies  Allergen Reactions   Penicillins Other (See Comments)    Childhood allergy.  Has patient had a PCN reaction causing immediate rash, facial/tongue/throat swelling, SOB or lightheadedness with hypotension: unknown Has patient had a PCN reaction causing severe rash involving mucus membranes or skin necrosis: unknown Has patient had a PCN reaction that required hospitalization: unknown Has patient had a PCN reaction occurring within the last 10 years: no If all of the above answers are "NO", then may proceed with Cephalosporin use.     Level of Care/Admitting Diagnosis ED Disposition     ED Disposition  Admit   Condition  --   Comment  Hospital Area: [facility] [[id number]]  Level of Care: Stepdown [14]  Covid Evaluation: Asymptomatic - no recent exposure (last 10 days) testing not required  Diagnosis: Acute respiratory failure with hypoxia [facility]) [[id number]]  Admitting Physician: Robb Child  Attending Physician: Robb Child  Certification:: I certify this patient will need inpatient services for at least 2 midnights  Expected Medical Readiness: 09/28/2023          B Medical/Surgery History Past Medical History:  Diagnosis Date   Cancer (HCC)    lung cancer   Diverticulosis    GERD (gastroesophageal reflux disease)    Hx of small bowel  obstruction    Hyperlipidemia    Hypertension    Hypothyroidism    IBS (irritable bowel syndrome)    Substance abuse (HCC)    Alcoholic, Drug addition   Thyroid  disease    Past Surgical History:  Procedure Laterality Date   arm surgery Right    BRONCHIAL BRUSHINGS  10/24/2019   Procedure: BRONCHIAL BRUSHINGS;  Surgeon: Denson Flake, MD;  Location: [facility] ENDOSCOPY;  Service: Cardiopulmonary;;  right lower lobe    BRONCHIAL BRUSHINGS  11/05/2019   Procedure: BRONCHIAL BRUSHINGS;  Surgeon: Denson Flake, MD;  Location: [facility] ENDOSCOPY;  Service: Pulmonary;;   BRONCHIAL NEEDLE ASPIRATION BIOPSY  10/24/2019   Procedure: BRONCHIAL NEEDLE ASPIRATION BIOPSIES;  Surgeon: Denson Flake, MD;  Location: [facility] ENDOSCOPY;  Service: Cardiopulmonary;;   BRONCHIAL NEEDLE ASPIRATION BIOPSY  11/05/2019   Procedure: BRONCHIAL NEEDLE ASPIRATION BIOPSIES;  Surgeon: Denson Flake, MD;  Location: [facility] ENDOSCOPY;  Service: Pulmonary;;   ENDOBRONCHIAL ULTRASOUND N/A 10/24/2019   Procedure: ENDOBRONCHIAL ULTRASOUND;  Surgeon: Denson Flake, MD;  Location: [facility] ENDOSCOPY;  Service: Cardiopulmonary;  Laterality: N/A;   FINGER SURGERY Right    Middle   IR IMAGING GUIDED PORT INSERTION  11/19/2019   VIDEO BRONCHOSCOPY N/A 10/24/2019   Procedure: VIDEO BRONCHOSCOPY WITHOUT FLUORO;  Surgeon: Denson Flake, MD;  Location: [facility] ENDOSCOPY;  Service: Cardiopulmonary;  Laterality: N/A;   VIDEO BRONCHOSCOPY WITH ENDOBRONCHIAL NAVIGATION N/A 11/05/2019   Procedure: VIDEO BRONCHOSCOPY WITH ENDOBRONCHIAL NAVIGATION;  Surgeon: Denson Flake, MD;  Location: [facility] ENDOSCOPY;  Service: Pulmonary;  Laterality: N/A;  A IV Location/Drains/Wounds Patient Lines/Drains/Airways Status     Active Line/Drains/Airways     Name Placement date Placement time Site Days   Implanted Port 11/19/19 Right Chest 11/19/19  1518  Chest  1406   Peripheral IV 09/25/23 20 G 1.88" Anterior;Right Forearm 09/25/23  1104  Forearm  less than 1             Intake/Output Last 24 hours No intake or output data in the 24 hours ending 09/25/23 1159  Labs/Imaging Results for orders placed or performed during the hospital encounter of 09/25/23 (from the past 48 hours)  Comprehensive metabolic panel     Status: Abnormal   Collection Time: 09/25/23  9:56 AM  Result Value Ref Range   Sodium 126 (L) 135 - 145 mmol/L   Potassium 3.8 3.5 - 5.1 mmol/L   Chloride 92 (L) 98 - 111 mmol/L   CO2 18 (L) 22 - 32 mmol/L   Glucose, Bld 118 (H) 70 - 99 mg/dL    Comment: Glucose reference range applies only to samples taken after fasting for at least 8 hours.   BUN 8 8 - 23 mg/dL   Creatinine, Ser 2.53 0.61 - 1.24 mg/dL   Calcium  8.2 (L) 8.9 - 10.3 mg/dL   Total Protein 6.6 6.5 - 8.1 g/dL   Albumin 2.5 (L) 3.5 - 5.0 g/dL   AST 37 15 - 41 U/L   ALT 17 0 - 44 U/L   Alkaline Phosphatase 99 38 - 126 U/L   Total Bilirubin 1.9 (H) 0.0 - 1.2 mg/dL   GFR, Estimated >66 >44 mL/min    Comment: (NOTE) Calculated using the CKD-EPI Creatinine Equation (2021)    Anion gap 16 (H) 5 - 15    Comment: Performed at [facility], [address], [place], [place] [address]  CBC with Differential     Status: Abnormal   Collection Time: 09/25/23  9:56 AM  Result Value Ref Range   WBC 8.6 4.0 - 10.5 K/uL   RBC 3.29 (L) 4.22 - 5.81 MIL/uL   Hemoglobin 10.0 (L) 13.0 - 17.0 g/dL   HCT 25.9 (L) 56.3 - 87.5 %   MCV 94.5 80.0 - 100.0 fL   MCH 30.4 26.0 - 34.0 pg   MCHC 32.2 30.0 - 36.0 g/dL   RDW 64.3 (H) 32.9 - 51.8 %   Platelets 188 150 - 400 K/uL   nRBC 0.0 0.0 - 0.2 %   Neutrophils Relative % 83 %   Neutro Abs 7.2 1.7 - 7.7 K/uL   Lymphocytes Relative 4 %   Lymphs Abs 0.4 (L) 0.7 - 4.0 K/uL   Monocytes Relative 10 %   Monocytes Absolute 0.9 0.1 - 1.0 K/uL   Eosinophils Relative 1 %   Eosinophils Absolute 0.0 0.0 - 0.5 K/uL   Basophils Relative 0 %   Basophils Absolute 0.0 0.0 - 0.1 K/uL   Immature Granulocytes 2 %   Abs Immature Granulocytes 0.16 (H) 0.00 -  0.07 K/uL    Comment: Performed at [facility], [address], [place], [place] [address]  Lactic acid, plasma     Status: None   Collection Time: 09/25/23 10:30 AM  Result Value Ref Range   Lactic Acid, Venous 1.6 0.5 - 1.9 mmol/L    Comment: Performed at [facility], [address], [place], [place] [address]  Protime-INR     Status: Abnormal   Collection Time: 09/25/23 10:30 AM  Result Value Ref Range   Prothrombin  Time 17.1 (H) 11.4 - 15.2 seconds   INR 1.4 (H) 0.8 - 1.2    Comment: (NOTE) INR goal varies based on device and disease states. Performed at [facility], [address] [place], [place] [address]   Urinalysis, w/ Reflex to Culture (Infection Suspected) -Urine, Clean Catch     Status: None   Collection Time: 09/25/23 11:00 AM  Result Value Ref Range   Specimen Source URINE, CLEAN CATCH    Color, Urine YELLOW YELLOW   APPearance CLEAR CLEAR   Specific Gravity, Urine 1.008 1.005 - 1.030   pH 6.0 5.0 - 8.0   Glucose, UA NEGATIVE NEGATIVE mg/dL   Hgb urine dipstick NEGATIVE NEGATIVE   Bilirubin Urine NEGATIVE NEGATIVE   Ketones, ur NEGATIVE NEGATIVE mg/dL   Protein, ur NEGATIVE NEGATIVE mg/dL   Nitrite NEGATIVE NEGATIVE   Leukocytes,Ua NEGATIVE NEGATIVE   RBC / HPF 0-5 0 - 5 RBC/hpf   WBC, UA 0-5 0 - 5 WBC/hpf    Comment:        Reflex urine culture not performed if WBC <=10, OR if Squamous epithelial cells >5. If Squamous epithelial cells >5 suggest recollection.    Bacteria, UA NONE SEEN NONE SEEN   Squamous Epithelial / HPF 0-5 0 - 5 /HPF    Comment: Performed at [facility], [address], [place], [place] [address]   DG Chest Port 1 View Result Date: 09/25/2023 CLINICAL DATA:  Questionable sepsis EXAM: PORTABLE CHEST 1 VIEW COMPARISON:  Sep 22, 2023 FINDINGS: Persistent unchanged bilateral basilar infiltrates atelectasis and right pleural effusion and reaction. No change in the right IJ Infusaport catheter The heart normal size IMPRESSION: Persistent  unchanged bilateral basilar infiltrates atelectasis and right pleural effusion and reaction. Electronically Signed   By: Fredrich Jefferson M.D.   On: 09/25/2023 10:34    Pending Labs Unresulted Labs (From admission, onward)     Start     Ordered   09/25/23 1155  MRSA Next Gen by PCR, Nasal  Once,   R        09/25/23 1154   09/25/23 0956  Brain natriuretic peptide  Once,   URGENT        09/25/23 0955   09/25/23 0947  Legionella Pneumophila Serogp 1 Ur Ag  Once,   URGENT        09/25/23 0946   09/25/23 0937  Resp panel by RT-PCR (RSV, Flu A&B, Covid) Anterior Nasal Swab  (Septic presentation on arrival (screening labs, nursing and treatment orders for obvious sepsis))  Once,   URGENT        09/25/23 0940   09/25/23 0937  Lactic acid, plasma  (Septic presentation on arrival (screening labs, nursing and treatment orders for obvious sepsis))  STAT Now then every 2 hours,   R (with STAT occurrences)      09/25/23 0940   09/25/23 0937  Blood Culture (routine x 2)  (Septic presentation on arrival (screening labs, nursing and treatment orders for obvious sepsis))  BLOOD CULTURE X 2,   STAT      09/25/23 0940            Vitals/Pain Today's Vitals   09/25/23 1030 09/25/23 1034 09/25/23 1045 09/25/23 1130  BP: 107/77  110/84 102/69  Pulse: (!) 114  (!) 125 (!) 118  Resp: (!) 35     Temp:      TempSrc:      SpO2: 96% 97% 93% 95%  Weight:  Height:      PainSc:        Isolation Precautions No active isolations  Medications Medications  lactated ringers  infusion (has no administration in time range)  azithromycin  (ZITHROMAX ) 500 mg in sodium chloride  0.9 % 250 mL IVPB (500 mg Intravenous New Bag/Given 09/25/23 1117)  cefTRIAXone (ROCEPHIN) 2 g in sodium chloride  0.9 % 100 mL IVPB (has no administration in time range)  azithromycin  (ZITHROMAX ) 500 mg in sodium chloride  0.9 % 250 mL IVPB (has no administration in time range)  dextromethorphan-guaiFENesin  (MUCINEX  DM) 30-600 MG per 12 hr  tablet 1 tablet (has no administration in time range)  ipratropium-albuterol  (DUONEB) 0.5-2.5 (3) MG/3ML nebulizer solution 3 mL (has no administration in time range)  albuterol  (PROVENTIL ) (2.5 MG/3ML) 0.083% nebulizer solution 2.5 mg (has no administration in time range)  lactated ringers  bolus 1,000 mL (1,000 mLs Intravenous New Bag/Given 09/25/23 1032)  cefTRIAXone (ROCEPHIN) 1 g in sodium chloride  0.9 % 100 mL IVPB (0 g Intravenous Stopped 09/25/23 1117)    Mobility walks     Focused Assessments Pulmonary Assessment Handoff:  Lung sounds: L Breath Sounds: Diminished R Breath Sounds: Diminished O2 Device: High Flow Nasal Cannula O2 Flow Rate (L/min): 14 L/min    R Recommendations: See Admitting Provider Note  Report given to:   Additional Notes: pt A/O x4

## 2023-09-25 NOTE — ED Notes (Signed)
 Antibiotics delayed d/t difficult IV sticks and no Port accessing kits in the ED.Materials made aware we need [employer] asap. Will attempt ultrasound IV

## 2023-09-25 NOTE — ED Notes (Signed)
 First set of cultures drawn but IV access infiltrated.

## 2023-09-25 NOTE — H&P (Addendum)
 History and Physical    Patient: Joseph Atkins DOB: November 07, 1960 DOA: 09/25/2023 DOS: the patient was seen and examined on 09/25/2023 PCP: [name], NP  Patient coming from: Home  Chief Complaint:  Chief Complaint  Patient presents with   Shortness of Breath   HPI: Joseph Atkins is a 63 y.o. male with medical history significant for GERD, HTN, hypothyroidism, HLD, alcohol and tobacco abuse, stage IV lung cancer with brain mets, recent diagnosis of community-acquired pneumonia on 09/22/2023, treated on Levaquin  (Levaquin  day # 4) as outpatient but clinically radiologically appears to have worsened. - In ED patient was found to be hypoxic with O2 sats of 72% on room air required up to 15 L of oxygen  initially after interventions patient has been weaned down to 6 L of oxygen  via nasal cannula. No fever  Or chills  No Nausea, Vomiting or Diarrhea - In ED CTA chest without PE, suggestive of bilateral pneumonia WBC 8.6 - Lactic acid 1.6 >> 1.8 - RSV, COVID and flu are negative BNP 95 Hgb 10.0, platelets 188 - Sodium 122, potassium 3.8, bicarb 18, creatinine 0.94, LFTs not elevated except for T. bili of 1.9  Review of Systems: As mentioned in the history of present illness. All other systems reviewed and are negative. Past Medical History:  Diagnosis Date   Cancer (HCC)    lung cancer   Diverticulosis    GERD (gastroesophageal reflux disease)    Hx of small bowel obstruction    Hyperlipidemia    Hypertension    Hypothyroidism    IBS (irritable bowel syndrome)    Substance abuse (HCC)    Alcoholic, Drug addition   Thyroid  disease    Past Surgical History:  Procedure Laterality Date   arm surgery Right    BRONCHIAL BRUSHINGS  10/24/2019   Procedure: BRONCHIAL BRUSHINGS;  Surgeon: Denson Flake, MD;  Location: [facility] ENDOSCOPY;  Service: Cardiopulmonary;;  right lower lobe    BRONCHIAL BRUSHINGS  11/05/2019   Procedure: BRONCHIAL BRUSHINGS;  Surgeon:  Denson Flake, MD;  Location: [facility] ENDOSCOPY;  Service: Pulmonary;;   BRONCHIAL NEEDLE ASPIRATION BIOPSY  10/24/2019   Procedure: BRONCHIAL NEEDLE ASPIRATION BIOPSIES;  Surgeon: Denson Flake, MD;  Location: [facility] ENDOSCOPY;  Service: Cardiopulmonary;;   BRONCHIAL NEEDLE ASPIRATION BIOPSY  11/05/2019   Procedure: BRONCHIAL NEEDLE ASPIRATION BIOPSIES;  Surgeon: Denson Flake, MD;  Location[facility] ENDOSCOPY;  Service: Pulmonary;;   ENDOBRONCHIAL ULTRASOUND N/A 10/24/2019   Procedure: ENDOBRONCHIAL ULTRASOUND;  Surgeon: Denson Flake, MD;  Location: [facility] ENDOSCOPY;  Service: Cardiopulmonary;  Laterality: N/A;   FINGER SURGERY Right    Middle   IR IMAGING GUIDED PORT INSERTION  11/19/2019   VIDEO BRONCHOSCOPY N/A 10/24/2019   Procedure: VIDEO BRONCHOSCOPY WITHOUT FLUORO;  Surgeon: Denson Flake, MD;  Location: [facility] ENDOSCOPY;  Service: Cardiopulmonary;  Laterality: N/A;   VIDEO BRONCHOSCOPY WITH ENDOBRONCHIAL NAVIGATION N/A 11/05/2019   Procedure: VIDEO BRONCHOSCOPY WITH ENDOBRONCHIAL NAVIGATION;  Surgeon: Denson Flake, MD;  Location: [facility] ENDOSCOPY;  Service: Pulmonary;  Laterality: N/A;   Social History:  reports that he has quit smoking. His smoking use included cigarettes. He has quit using smokeless tobacco.  His smokeless tobacco use included chew. He reports that he does not drink alcohol and does not use drugs.  Allergies  Allergen Reactions   Penicillins Other (See Comments)    Childhood allergy.  Has patient had a PCN reaction causing immediate rash, facial/tongue/throat swelling, SOB or lightheadedness with hypotension: unknown  Has patient had a PCN reaction causing severe rash involving mucus membranes or skin necrosis: unknown Has patient had a PCN reaction that required hospitalization: unknown Has patient had a PCN reaction occurring within the last 10 years: no If all of the above answers are "NO", then may proceed with Cephalosporin use.     Family History  Problem Relation Age of  Onset   Epilepsy Mother    Heart disease Mother    Heart disease Brother    Lung cancer Paternal Uncle     Prior to Admission medications   Medication Sig Start Date End Date Taking? Authorizing Provider  acetaminophen  (TYLENOL ) 500 MG tablet Take 500 mg by mouth every 6 (six) hours as needed.    [provider]  albuterol  (PROVENTIL  HFA;VENTOLIN  HFA) 108 (90 Base) MCG/ACT inhaler Inhale 2 puffs into the lungs every 6 (six) hours as needed for wheezing or shortness of breath. 02/07/17   Dettinger, Lucio Sabin, MD  amLODipine  (NORVASC ) 5 MG tablet TAKE 1 TABLET (5 MG TOTAL) BY MOUTH DAILY. 05/29/23   Vaslow, Zachary K, MD  ANORO ELLIPTA 62.5-25 MCG/INH AEPB 1 puff daily. 11/26/19   [provider]  augmented betamethasone dipropionate (DIPROLENE-AF) 0.05 % cream Apply topically 2 (two) times daily. 12/15/21   [provider]  benzonatate  (TESSALON ) 100 MG capsule Take 1 capsule (100 mg total) by mouth 3 (three) times daily as needed. 01/18/23   Heilingoetter, Cassandra L, PA-C  [facility] ASPIRIN  LOW DOSE 81 MG tablet TAKE 1 TABLET (81 MG TOTAL) BY MOUTH DAILY. SWALLOW WHOLE. 01/03/22   Vaslow, Zachary K, MD  cyclobenzaprine  (FLEXERIL ) 10 MG tablet Take 10 mg by mouth 2 (two) times daily as needed for muscle spasms. 10/15/19   [provider]  docusate sodium  (COLACE) 100 MG capsule Take 100 mg by mouth daily.    [provider]  doxycycline  (VIBRA -TABS) 100 MG tablet Take 1 tablet (100 mg total) by mouth 2 (two) times daily. 08/30/23   Heilingoetter, Cassandra L, PA-C  DULoxetine  (CYMBALTA ) 20 MG capsule Take 1 capsule (20 mg total) by mouth 2 (two) times daily. 02/18/20   Tanner, Darius Edouard., PA-C  fluticasone  furoate-vilanterol (BREO ELLIPTA ) 100-25 MCG/INH AEPB Inhale 1 puff into the lungs daily. 05/25/17   Dettinger, Lucio Sabin, MD  folic acid  (FOLVITE ) 1 MG tablet TAKE 1 TABLET BY MOUTH EVERY DAY 06/30/23   Heilingoetter, Cassandra L, PA-C  HYDROcodone  bit-homatropine  (HYCODAN) 5-1.5 MG/5ML syrup Take 5 mLs by mouth every 6 (six) hours as needed for cough. 03/07/23   Heilingoetter, Cassandra L, PA-C  hydrOXYzine (ATARAX) 25 MG tablet Take 25 mg by mouth at bedtime. 12/15/21   [provider]  levofloxacin  (LEVAQUIN ) 750 MG tablet Take 1 tablet (750 mg total) by mouth daily for 7 days. 09/22/23 09/29/23  Walisiewicz, Kaitlyn E, PA-C  levothyroxine  (SYNTHROID ) 100 MCG tablet TAKE 1 TABLET BY MOUTH EVERY DAY 08/14/23   Heilingoetter, Cassandra L, PA-C  lidocaine -prilocaine  (EMLA ) cream Apply 1 application topically as needed. 11/11/19   Heilingoetter, Cassandra L, PA-C  loratadine (CLARITIN) 10 MG tablet Take 10 mg by mouth daily.    [provider]  LORazepam  (ATIVAN ) 1 MG tablet Take 1 tablet (1 mg total) by mouth once as needed for up to 1 dose for anxiety (MRI claustrophobia). 09/14/23   Vaslow, Zachary K, MD  lumateperone tosylate (CAPLYTA) 10.5 MG capsule Take 42 mg by mouth daily. For sleep    [provider]  omeprazole  (PRILOSEC) 40 MG  capsule Take 1 capsule (40 mg total) by mouth daily. 02/07/17   Dettinger, Lucio Sabin, MD  ondansetron  (ZOFRAN ) 4 MG tablet Take 1 tablet (4 mg total) by mouth every 8 (eight) hours as needed. 02/21/20   Tanner, Van E., PA-C  pembrolizumab  (KEYTRUDA ) 100 MG/4ML SOLN See admin instructions.    [provider]  polyethylene glycol powder (MIRALAX ) powder Take 17 g by mouth daily. 05/09/14   Doc Freed, FNP  prochlorperazine  (COMPAZINE ) 10 MG tablet Take 1 tablet (10 mg total) by mouth every 6 (six) hours as needed. 06/21/23   Heilingoetter, Cassandra L, PA-C  temazepam  (RESTORIL ) 30 MG capsule Take 1 capsule (30 mg total) by mouth at bedtime as needed for sleep. 11/07/22   Marlene Simas, MD  triamcinolone  cream (KENALOG ) 0.1 % APPLY TOPICALLY 2 TIMES DAILY AS NEEDED. 01/27/23   Heilingoetter, Cassandra L, PA-C  TRULANCE 3 MG TABS Take 1 tablet by mouth daily. 09/06/19   [provider]     Physical Exam: Vitals:   09/25/23 1600 09/25/23 1700 09/25/23 1742 09/25/23 1834  BP: 104/68 109/75    Pulse: (!) 124  (!) 126   Resp: (!) 22  (!) 36   Temp:      TempSrc:      SpO2: 92%  96% 90%  Weight:      Height:        Physical Exam  Gen:- Awake Alert, conversational dyspnea HEENT:- [place].AT, No sclera icterus Nose- [place] 6L/min Neck-Supple Neck,No JVD,.  Lungs-diminished breath sounds, scattered wheezes bilaterally  CV- S1, S2 normal, RRR, right-sided Port-A-Cath in situ Abd-  +ve B.Sounds, Abd Soft, No tenderness,    Extremity/Skin:- No  edema,   good pedal pulses  Psych-affect is appropriate, oriented x3 Neuro-generalized weakness no new focal deficits, no tremors  Data Reviewed: CTA chest without PE, suggestive of bilateral pneumonia WBC 8.6 - Lactic acid 1.6 >> 1.8 - RSV, COVID and flu are negative BNP 95 Hgb 10.0, platelets 188 - Sodium 122, potassium 3.8, bicarb 18, creatinine 0.94, LFTs not elevated except for T. bili of 1.9  Assessment and Plan: 1)CAP--recent diagnosis of community-acquired pneumonia on 09/22/2023, treated on Levaquin  (Levaquin  day # 4) as outpatient but clinically radiologically appears to have worsened. -WBC 8.6 - Lactic acid 1.6 >> 1.8 - RSV, COVID and flu are negative --Does Not meet sepsis criteria - Please see chest x-ray from 09/22/2023 and CTA chest from 09/25/2023 - Treated briefly with IV Rocephin/azithromycin  along with bronchodilators and mucolytics pending culture data  2) acute hypoxic respiratory failure--- In ED patient was found to be hypoxic with O2 sats of 72% on room air required up to 15 L of oxygen  initially after interventions patient has been weaned down to 6 L of oxygen  via nasal cannula. -- Management as above #1  3)Stage IV (T3, N0, M1C) non-small cell lung cancer, adenocarcinoma -with mets to the brain -outpatient follow-up Dr. Randall Bush advised -- Hold Keytruda  due to acute  infection  4)Social/Ethics--- palliative care consult appreciated - Patient remains full code  5) chronic anemia of malignancy and chemotherapy--- Hgb currently 10.0 which is higher than recent baseline - Continue folic acid  - Monitor Hgb closely and transfuse as indicated  6)Hypothyroidism--continue levothyroxine   7)GERD--continue Protonix   8) tobacco abuse/COPD--- continue bronchodilators - Solu-Medrol  as ordered  9) depression/anxiety--stable, continue Cymbalta  May use hydroxyzine as needed   Advance Care Planning:   Code Status: Full Code   Consults: Palliative care  Family Communication: None at  Bedside at this time  Severity of Illness: The appropriate patient status for this patient is INPATIENT. Inpatient status is judged to be reasonable and necessary in order to provide the required intensity of service to ensure the patient's safety. The patient's presenting symptoms, physical exam findings, and initial radiographic and laboratory data in the context of their chronic comorbidities is felt to place them at high risk for further clinical deterioration. Furthermore, it is not anticipated that the patient will be medically stable for discharge from the hospital within 2 midnights of admission.   * I certify that at the point of admission it is my clinical judgment that the patient will require inpatient hospital care spanning beyond 2 midnights from the point of admission due to high intensity of service, high risk for further deterioration and high frequency of surveillance required.*  Author: Colin Dawley, MD 09/25/2023 6:42 PM  For on call review [email or web address].

## 2023-09-25 NOTE — Consult Note (Signed)
 Consultation Note Date: 09/25/2023   Patient Name: Joseph Atkins  DOB: [birth year]  MRN: [id number]  Age / Sex: 63 y.o., male  PCP: Joseph Quint, NP Referring Physician: Colin Dawley, MD  Reason for Consultation: Establishing goals of care  HPI/Patient Profile: 63 y.o. male  with past medical history of stage IV NSCLC adenocarcinoma with mets to brain (follows with Joseph Atkins, Joseph Atkins, Joseph Atkins), hypothyroidism, IBS, small bowel obstruction, diverticulosis, GERD, substance use (not in many years) admitted on 09/25/2023 with sepsis pneumonia with respiratory decline.   Clinical Assessment and Goals of Care: Consult received and chart review completed. I met today with Joseph Atkins and wife Joseph Atkins. We had a good conversation about his status and changing health. We reviewed his current statue with pneumonia in the setting of lung cancer. We reviewed time for outcomes to treat pneumonia. He is already requiring less oxygen . He very much wishes to get rid of his oxygen . He shares that his father is oxygen  dependent and he does not want this for himself.   We have a good, honest conversation. Joseph Atkins shares that he avoided coming to the hospital because he has felt if he comes to the hospital then he would not leave. He shares that he wants to die at home if reasonably possible. He shares that there was a time recently when he considered stopping chemotherapy but ultimately decided to continue. We reviewed how amazing it has been that he has been able to avoid hospitalization in the years he has been dealing with his cancer. I also shared with him my role with palliative care and that I will honestly share with him if I felt he would not survive to get out of the hospital and try to help him spent his last days at home if at all possible. I shared that I do not feel we are at this point yet. However, this is the perfect  time to begin discussions to anticipate how we can care for him when his health does decline.   We discussed Living Will and they will consider if they wish to complete. We discussed code status - Joseph Atkins shares that he does not feel he would want to be on a breathing machine. I discussed expectations following resuscitation in the setting of advanced chronic illness such as his cancer. I recommended consideration of DNR given his stated wishes to die at home. I encouraged Joseph Atkins and Joseph Atkins to discuss these decisions. Joseph Atkins would like some time to think and speak with Joseph Atkins about DNR status.   We discussed recommendation to connect with outpatient palliative care to continue these discussions even when discharged from the hospital. I explained that I have a colleague providing palliative care at [facility] that I can connect him with - they both agree this would be helpful.   All questions/concerns addressed. Emotional support provided.   Primary Decision Maker PATIENT    SUMMARY OF RECOMMENDATIONS   - Considering wishes for code status - Ongoing palliative support inpatient and  then outpatient  Code Status/Advance Care Planning: Full code -  considering options/wishes   Symptom Management:  Per attending.   Prognosis:  Overall long term prognosis poor.   Discharge Planning: To Be Determined      Primary Diagnoses: Present on Admission:  Acute respiratory failure with hypoxia (HCC)   I have reviewed the medical record, interviewed the patient and family, and examined the patient. The following aspects are pertinent.  Past Medical History:  Diagnosis Date   Cancer (HCC)    lung cancer   Diverticulosis    GERD (gastroesophageal reflux disease)    Hx of small bowel obstruction    Hyperlipidemia    Hypertension    Hypothyroidism    IBS (irritable bowel syndrome)    Substance abuse (HCC)    Alcoholic, Drug addition   Thyroid  disease    Social History   Socioeconomic  History   Marital status: Married    Spouse name: Not on file   Number of children: 2   Years of education: Not on file   Highest education level: Not on file  Occupational History   Occupation: supervisor    Employer: [employer]  Tobacco Use   Smoking status: Former    Current packs/day: 1.50    Types: Cigarettes   Smokeless tobacco: Former    Types: Associate Professor status: Never Used  Substance and Sexual Activity   Alcohol use: No    Comment: Alcoholic clean for 6 years   Drug use: No    Comment: Recovering Drug Addict-clean for 6 years   Sexual activity: Not on file  Other Topics Concern   Not on file  Social History Narrative   Not on file   Social Drivers of Health   Financial Resource Strain: Medium Risk (10/31/2019)   Overall Financial Resource Strain (CARDIA)    Difficulty of Paying Living Expenses: Somewhat hard  Food Insecurity: No Food Insecurity (09/25/2023)   Hunger Vital Sign    Worried About Running Out of Food in the Last Year: Never true    Ran Out of Food in the Last Year: Never true  Transportation Needs: No Transportation Needs (09/25/2023)   PRAPARE - Administrator, Civil Service (Medical): No    Lack of Transportation (Non-Medical): No  Physical Activity: Insufficiently Active (10/31/2019)   Exercise Vital Sign    Days of Exercise per Week: 5 days    Minutes of Exercise per Session: 20 min  Stress: Stress Concern Present (10/31/2019)   [employer] of Occupational Health - Occupational Stress Questionnaire    Feeling of Stress : Very much  Social Connections: Moderately Isolated (10/31/2019)   Social Connection and Isolation Panel [NHANES]    Frequency of Communication with Friends and Family: More than three times a week    Frequency of Social Gatherings with Friends and Family: More than three times a week    Attends Religious Services: Never    Database administrator or Organizations: No    Attends Museum/gallery exhibitions officer: Never    Marital Status: Married   Family History  Problem Relation Age of Onset   Epilepsy Mother    Heart disease Mother    Heart disease Brother    Lung cancer Paternal Uncle    Scheduled Meds:  dextromethorphan-guaiFENesin   1 tablet Oral BID   ipratropium-albuterol   3 mL Nebulization Q6H   Continuous Infusions:  [START ON 09/26/2023] azithromycin      [  START ON 09/26/2023] cefTRIAXone (ROCEPHIN)  IV     lactated ringers  150 mL/hr at 09/25/23 1248   PRN Meds:.albuterol  Allergies  Allergen Reactions   Penicillins Other (See Comments)    Childhood allergy.  Has patient had a PCN reaction causing immediate rash, facial/tongue/throat swelling, SOB or lightheadedness with hypotension: unknown Has patient had a PCN reaction causing severe rash involving mucus membranes or skin necrosis: unknown Has patient had a PCN reaction that required hospitalization: unknown Has patient had a PCN reaction occurring within the last 10 years: no If all of the above answers are "NO", then may proceed with Cephalosporin use.    Review of Systems  Constitutional:  Positive for activity change, appetite change and fatigue.  Respiratory:  Positive for shortness of breath.     Physical Exam Vitals and nursing note reviewed.  Constitutional:      General: He is not in acute distress. Cardiovascular:     Rate and Rhythm: Tachycardia present.  Pulmonary:     Effort: No tachypnea, accessory muscle usage or respiratory distress.     Comments: No distress at rest on [place] Abdominal:     Palpations: Abdomen is soft.  Neurological:     Mental Status: He is alert and oriented to person, place, and time.     Vital Signs: BP (!) 115/49 (BP Location: Left Arm)   Pulse (!) 121   Temp (!) 97.5 F (36.4 C) (Oral)   Resp (!) 35   Ht 5\' 8"  (1.727 m)   Wt 72 kg   SpO2 (!) 89%   BMI 24.14 kg/m  Pain Scale: 0-10   Pain Score: 10-Worst pain ever   SpO2: SpO2: (!) 89 % O2  Device:SpO2: (!) 89 % O2 Flow Rate: .O2 Flow Rate (L/min): 10 L/min  IO: Intake/output summary:  Intake/Output Summary (Last 24 hours) at 09/25/2023 1302 Last data filed at 09/25/2023 1249 Gross per 24 hour  Intake --  Output 100 ml  Net -100 ml    LBM:   Baseline Weight: Weight: 75.8 kg Most recent weight: Weight: 72 kg     Palliative Assessment/Data:    Time Total: 75 min  Greater than 50%  of this time was spent counseling and coordinating care related to the above assessment and plan.  Signed by: Vila Grayer, NP Palliative Medicine Team Pager # [phone] (M-F 8a-5p) Team Phone # [phone] (Nights/Weekends)

## 2023-09-25 NOTE — ED Triage Notes (Signed)
 Pt reports he is a cancer pt with c/o SOB since last week, pt was diagnosed with pneumonia on Fri, no home O2, room air sat 72%, ED provider to bedside upon room arrival

## 2023-09-26 ENCOUNTER — Encounter: Payer: Self-pay | Admitting: Physician Assistant

## 2023-09-26 ENCOUNTER — Encounter: Payer: Self-pay | Admitting: Internal Medicine

## 2023-09-26 DIAGNOSIS — C7931 Secondary malignant neoplasm of brain: Secondary | ICD-10-CM | POA: Diagnosis not present

## 2023-09-26 DIAGNOSIS — Z7189 Other specified counseling: Secondary | ICD-10-CM | POA: Diagnosis not present

## 2023-09-26 DIAGNOSIS — Z515 Encounter for palliative care: Secondary | ICD-10-CM | POA: Diagnosis not present

## 2023-09-26 DIAGNOSIS — C3491 Malignant neoplasm of unspecified part of right bronchus or lung: Secondary | ICD-10-CM | POA: Diagnosis not present

## 2023-09-26 DIAGNOSIS — J9601 Acute respiratory failure with hypoxia: Secondary | ICD-10-CM | POA: Diagnosis not present

## 2023-09-26 DIAGNOSIS — J441 Chronic obstructive pulmonary disease with (acute) exacerbation: Secondary | ICD-10-CM

## 2023-09-26 DIAGNOSIS — J189 Pneumonia, unspecified organism: Secondary | ICD-10-CM | POA: Diagnosis not present

## 2023-09-26 DIAGNOSIS — I10 Essential (primary) hypertension: Secondary | ICD-10-CM | POA: Diagnosis not present

## 2023-09-26 LAB — BASIC METABOLIC PANEL WITH GFR
Anion gap: 8 (ref 5–15)
BUN: 8 mg/dL (ref 8–23)
CO2: 23 mmol/L (ref 22–32)
Calcium: 7.8 mg/dL — ABNORMAL LOW (ref 8.9–10.3)
Chloride: 96 mmol/L — ABNORMAL LOW (ref 98–111)
Creatinine, Ser: 0.73 mg/dL (ref 0.61–1.24)
GFR, Estimated: >60 mL/min (ref >60)
Glucose, Bld: 137 mg/dL — ABNORMAL HIGH (ref 70–99)
Potassium: 3.4 mmol/L — ABNORMAL LOW (ref 3.5–5.1)
Sodium: 127 mmol/L — ABNORMAL LOW (ref 135–145)

## 2023-09-26 LAB — HIV ANTIBODY (ROUTINE TESTING W REFLEX): HIV Screen 4th Generation wRfx: NONREACTIVE

## 2023-09-26 MED ORDER — LUMATEPERONE TOSYLATE 42 MG PO CAPS
42.0000 mg | ORAL_CAPSULE | Freq: Every day | ORAL | Status: DC
  Administered 2023-09-27 – 2023-09-30 (×4): 42 mg via ORAL
  Filled 2023-09-26 (×5): qty 1

## 2023-09-26 MED ORDER — FLUTICASONE FUROATE-VILANTEROL 100-25 MCG/ACT IN AEPB
1.0000 | INHALATION_SPRAY | Freq: Every day | RESPIRATORY_TRACT | Status: DC
  Administered 2023-09-26 – 2023-10-01 (×6): 1 via RESPIRATORY_TRACT

## 2023-09-26 MED ORDER — POTASSIUM CHLORIDE CRYS ER 20 MEQ PO TBCR
40.0000 meq | EXTENDED_RELEASE_TABLET | ORAL | Status: AC
  Administered 2023-09-26 (×2): 40 meq via ORAL
  Filled 2023-09-26 (×2): qty 2

## 2023-09-26 MED ORDER — ENSURE PLUS HIGH PROTEIN PO LIQD
237.0000 mL | Freq: Two times a day (BID) | ORAL | Status: DC
  Administered 2023-09-26 – 2023-09-29 (×6): 237 mL via ORAL

## 2023-09-26 MED ORDER — CHLORHEXIDINE GLUCONATE CLOTH 2 % EX PADS
6.0000 | MEDICATED_PAD | Freq: Every day | CUTANEOUS | Status: DC
  Administered 2023-09-26 – 2023-09-30 (×6): 6 via TOPICAL

## 2023-09-26 MED ORDER — LUMATEPERONE TOSYLATE 42 MG PO CAPS
42.0000 mg | ORAL_CAPSULE | Freq: Every day | ORAL | Status: AC
  Administered 2023-09-26: 42 mg via ORAL
  Filled 2023-09-26: qty 1

## 2023-09-26 NOTE — Progress Notes (Signed)
 PROGRESS NOTE     Joseph Atkins, is a 63 y.o. male, DOB - 12-24-1960, ZOX:[id number]  Admit date - 09/25/2023   Admitting Physician Joseph Arambula Quintella Buck, MD  Outpatient Primary MD for the patient is Joseph Quint, NP  LOS - 1  Chief Complaint  Patient presents with   Shortness of Breath      Brief Narrative:  63 y.o. male with medical history significant for GERD, HTN, hypothyroidism, HLD, alcohol and tobacco abuse, stage IV lung cancer with brain mets, recent diagnosis of community-acquired pneumonia on 09/22/2023, treated on Levaquin  (Levaquin  day # 4 at the time of admission) as outpatient but clinically radiologically appears to have worsened. - Admitted 09/25/2023 with acute hypoxic respiratory failure secondary to worsening CAP.    -Assessment and Plan: 1)CAP--recent diagnosis of community-acquired pneumonia on 09/22/2023, treated on Levaquin  (Levaquin  day # 4) as outpatient but clinically radiologically appears to have worsened. -WBC 8.6 - Lactic acid 1.6 >> 1.8 - RSV, COVID and flu are negative --Does Not meet sepsis criteria - Please see chest x-ray from 09/22/2023 and CTA chest from 09/25/2023 09/26/23 - Desaturates easily with positional change and coughing episodes --Oxygen  has been titrated back up to 12 L per nasal cannula -c/n IV Rocephin/azithromycin  along with bronchodilators and mucolytics pending culture data -Repeat chest x-ray on 09/27/2023, may benefit from thoracentesis pending chest x-ray findings in a.m. --WBC may trend up due to steroids   2) acute hypoxic respiratory failure--- In ED patient was found to be hypoxic with O2 sats of 72% on room air required up to 15 L of oxygen  initially -Not previously on oxygen  -Will most likely need oxygen  at discharge ----Current oxygen  requirement and management as above #1   3)Stage IV (T3, N0, M1C) non-small cell lung cancer, adenocarcinoma -with mets to the brain -outpatient follow-up Dr. Randall Atkins  advised -- Hold Keytruda  due to acute infection   4)Social/Ethics--- palliative care consult appreciated - Patient remains full code   5) chronic anemia of malignancy and chemotherapy--- Hgb currently 10.0 which is higher than recent baseline - Continue folic acid  - Monitor Hgb closely and transfuse as indicated   6)Hypothyroidism--continue levothyroxine    7)GERD--continue Protonix    8)Tobacco Abuse/acute COPD exacerbation --- due to #1 above - continue bronchodilators - Solu-Medrol  as ordered   9)Depression/Anxiety--stable,  --continue Cymbalta  May use hydroxyzine as needed  Status is: Inpatient   Disposition: The patient is from: Home              Anticipated d/c is to: Home              Anticipated d/c date is: 2 days              Patient currently is not medically stable to d/c. Barriers: Not Clinically Stable-   Code Status :  -  Code Status: Full Code   Family Communication:   (patient is alert, awake and coherent)  Discussed with wife at bedside  DVT Prophylaxis  :   - SCDs  heparin  injection 5,000 Units Start: 09/25/23 2200 SCDs Start: 09/25/23 1838 Place TED hose Start: 09/25/23 1838   Lab Results  Component Value Date   PLT 188 09/25/2023    Inpatient Medications  Scheduled Meds:  amLODipine   5 mg Oral Daily   aspirin  EC  81 mg Oral Q breakfast   Chlorhexidine  Gluconate Cloth  6 each Topical Daily   dextromethorphan-guaiFENesin   1 tablet Oral BID   DULoxetine   20 mg Oral BID  feeding supplement  237 mL Oral BID BM   fluticasone  furoate-vilanterol  1 puff Inhalation Daily   folic acid   1 mg Oral Daily   heparin   5,000 Units Subcutaneous Q8H   hydrOXYzine  25 mg Oral QHS   ipratropium-albuterol   3 mL Nebulization Q6H   levothyroxine   100 mcg Oral Daily   lumateperone tosylate  42 mg Oral Daily   methylPREDNISolone  (SOLU-MEDROL ) injection  40 mg Intravenous Q12H   pantoprazole   40 mg Oral Daily   polyethylene glycol powder  17 g Oral Daily    potassium chloride   40 mEq Oral Q3H   senna-docusate  2 tablet Oral QHS   sodium chloride  flush  3 mL Intravenous Q12H   sodium chloride  flush  3 mL Intravenous Q12H   Continuous Infusions:  sodium chloride      azithromycin  500 mg (09/26/23 0923)   cefTRIAXone (ROCEPHIN)  IV     PRN Meds:.sodium chloride , acetaminophen  **OR** acetaminophen , albuterol , bisacodyl, HYDROcodone  bit-homatropine, ondansetron  **OR** ondansetron  (ZOFRAN ) IV, polyethylene glycol, sodium chloride  flush, temazepam    Anti-infectives (From admission, onward)    Start     Dose/Rate Route Frequency Ordered Stop   09/26/23 1000  cefTRIAXone (ROCEPHIN) 2 g in sodium chloride  0.9 % 100 mL IVPB        2 g 200 mL/hr over 30 Minutes Intravenous Every 24 hours 09/25/23 1142     09/26/23 1000  azithromycin  (ZITHROMAX ) 500 mg in sodium chloride  0.9 % 250 mL IVPB        500 mg 250 mL/hr over 60 Minutes Intravenous Every 24 hours 09/25/23 1142     09/25/23 0945  cefTRIAXone (ROCEPHIN) 1 g in sodium chloride  0.9 % 100 mL IVPB        1 g 200 mL/hr over 30 Minutes Intravenous  Once 09/25/23 0943 09/25/23 1117   09/25/23 0945  azithromycin  (ZITHROMAX ) 500 mg in sodium chloride  0.9 % 250 mL IVPB        500 mg 250 mL/hr over 60 Minutes Intravenous  Once 09/25/23 0943 09/25/23 1251      Subjective: Joseph Atkins today has no fevers, no emesis,  No chest pain,   Wife at bedside, questions answered -- Very short of breath, - Desaturates easily with positional change and coughing episodes --Oxygen  has been titrated back up to 12 L per nasal cannula -  Objective: Vitals:   09/26/23 0500 09/26/23 0600 09/26/23 0700 09/26/23 0800  BP: 119/67 (!) 86/61 113/74 114/67  Pulse: (!) 108 (!) 120 (!) 108 (!) 118  Resp: (!) 24 (!) 30 (!) 23 (!) 29  Temp:      TempSrc:      SpO2: 93% (!) 89% 92% 94%  Weight:      Height:        Intake/Output Summary (Last 24 hours) at 09/26/2023 1016 Last data filed at 09/26/2023 0602 Gross per  24 hour  Intake 3440.94 ml  Output 1075 ml  Net 2365.94 ml   Filed Weights   09/25/23 0938 09/25/23 1244  Weight: 75.8 kg 72 kg    Physical Exam Gen:- Awake Alert, conversational dyspnea HEENT:- Joseph Atkins, No sclera icterus Nose- [place] 12 L/min Neck-Supple Neck,No JVD,.  Lungs-diminished breath sounds, scattered wheezes and rhonchi  bilaterally  CV- S1, S2 normal, RRR, right-sided Port-A-Cath in situ Abd-  +ve B.Sounds, Abd Soft, No tenderness,    Extremity/Skin:- No  edema,   good pedal pulses  Psych-affect is appropriate, oriented x3 Neuro-generalized weakness no new focal deficits,  no tremors  Data Reviewed: I have personally reviewed following labs and imaging studies  CBC: Recent Labs  Lab 09/21/23 1446 09/25/23 0956  WBC 4.4 8.6  NEUTROABS 3.1 7.2  HGB 7.8* 10.0*  HCT 23.2* 31.1*  MCV 90.3 94.5  PLT 108* 188   Basic Metabolic Panel: Recent Labs  Lab 09/21/23 1446 09/25/23 0956 09/26/23 0444  NA 126* 126* 127*  K 3.5 3.8 3.4*  CL 92* 92* 96*  CO2 25 18* 23  GLUCOSE 105* 118* 137*  BUN 9 8 8   CREATININE 0.95 0.94 0.73  CALCIUM  8.4* 8.2* 7.8*   GFR: Estimated Creatinine Clearance: 93.8 mL/min (by C-G formula based on SCr of 0.73 mg/dL). Liver Function Tests: Recent Labs  Lab 09/21/23 1446 09/25/23 0956  AST 19 37  ALT 15 17  ALKPHOS 97 99  BILITOT 1.2 1.9*  PROT 6.7 6.6  ALBUMIN 3.3* 2.5*    Recent Results (from the past 240 hours)  Blood Culture (routine x 2)     Status: None (Preliminary result)   Collection Time: 09/25/23  9:42 AM   Specimen: BLOOD  Result Value Ref Range Status   Specimen Description BLOOD RIGHT ARM  Final   Special Requests   Final    BOTTLES DRAWN AEROBIC AND ANAEROBIC Blood Culture adequate volume   Culture   Final    NO GROWTH < 24 HOURS Performed at [facility], [address] [place], [place] [address]    Report Status PENDING  Incomplete  Blood Culture (routine x 2)     Status: None (Preliminary result)    Collection Time: 09/25/23  9:56 AM   Specimen: BLOOD  Result Value Ref Range Status   Specimen Description BLOOD RIGHT HAND  Final   Special Requests   Final    BOTTLES DRAWN AEROBIC AND ANAEROBIC Blood Culture results may not be optimal due to an inadequate volume of blood received in culture bottles   Culture   Final    NO GROWTH < 24 HOURS Performed at [facility], [address], [place], [place] [address]    Report Status PENDING  Incomplete  Resp panel by RT-PCR (RSV, Flu A&B, Covid) Anterior Nasal Swab     Status: None   Collection Time: 09/25/23 11:00 AM   Specimen: Anterior Nasal Swab  Result Value Ref Range Status   SARS Coronavirus 2 by RT PCR NEGATIVE NEGATIVE Final    Comment: (NOTE) SARS-CoV-2 target nucleic acids are NOT DETECTED.  The SARS-CoV-2 RNA is generally detectable in upper respiratory specimens during the acute phase of infection. The lowest concentration of SARS-CoV-2 viral copies this assay can detect is 138 copies/mL. A negative result does not preclude SARS-Cov-2 infection and should not be used as the sole basis for treatment or other patient management decisions. A negative result may occur with  improper specimen collection/handling, submission of specimen other than nasopharyngeal swab, presence of viral mutation(s) within the areas targeted by this assay, and inadequate number of viral copies(<138 copies/mL). A negative result must be combined with clinical observations, patient history, and epidemiological information. The expected result is Negative.  Fact Sheet for Patients:  BloggerCourse.com  Fact Sheet for Healthcare Providers:  SeriousBroker.it  This test is no t yet approved or cleared by the [place]  FDA and  has been authorized for detection and/or diagnosis of SARS-CoV-2 by FDA under an Emergency Use Authorization (EUA). This EUA will remain  in effect (meaning this test can be  used) for the  duration of the COVID-19 declaration under Section 564(b)(1) of the Act, 21 U.S.C.section 360bbb-3(b)(1), unless the authorization is terminated  or revoked sooner.       Influenza A by PCR NEGATIVE NEGATIVE Final   Influenza B by PCR NEGATIVE NEGATIVE Final    Comment: (NOTE) The Xpert Xpress SARS-CoV-2/FLU/RSV plus assay is intended as an aid in the diagnosis of influenza from Nasopharyngeal swab specimens and should not be used as a sole basis for treatment. Nasal washings and aspirates are unacceptable for Xpert Xpress SARS-CoV-2/FLU/RSV testing.  Fact Sheet for Patients: BloggerCourse.com  Fact Sheet for Healthcare Providers: SeriousBroker.it  This test is not yet approved or cleared by the [place]  FDA and has been authorized for detection and/or diagnosis of SARS-CoV-2 by FDA under an Emergency Use Authorization (EUA). This EUA will remain in effect (meaning this test can be used) for the duration of the COVID-19 declaration under Section 564(b)(1) of the Act, 21 U.S.C. section 360bbb-3(b)(1), unless the authorization is terminated or revoked.     Resp Syncytial Virus by PCR NEGATIVE NEGATIVE Final    Comment: (NOTE) Fact Sheet for Patients: BloggerCourse.com  Fact Sheet for Healthcare Providers: SeriousBroker.it  This test is not yet approved or cleared by the [place]  FDA and has been authorized for detection and/or diagnosis of SARS-CoV-2 by FDA under an Emergency Use Authorization (EUA). This EUA will remain in effect (meaning this test can be used) for the duration of the COVID-19 declaration under Section 564(b)(1) of the Act, 21 U.S.C. section 360bbb-3(b)(1), unless the authorization is terminated or revoked.  Performed at [facility], [address], [place], [place] [address]   MRSA Next Gen by PCR, Nasal     Status: None    Collection Time: 09/25/23 12:42 PM   Specimen: Nasal Mucosa; Nasal Swab  Result Value Ref Range Status   MRSA by PCR Next Gen NOT DETECTED NOT DETECTED Final    Comment: (NOTE) The GeneXpert MRSA Assay (FDA approved for NASAL specimens only), is one component of a comprehensive MRSA colonization surveillance program. It is not intended to diagnose MRSA infection nor to guide or monitor treatment for MRSA infections. Test performance is not FDA approved in patients less than 44 years old. Performed at [facility], [address], [place], [place] [address]     Radiology Studies: CT Angio Chest PE W and/or Wo Contrast Result Date: 09/25/2023 CLINICAL DATA:  Pulmonary embolism suspected EXAM: CT ANGIOGRAPHY CHEST WITH CONTRAST TECHNIQUE: Multidetector CT imaging of the chest was performed using the standard protocol during bolus administration of intravenous contrast. Multiplanar CT image reconstructions and MIPs were obtained to evaluate the vascular anatomy. RADIATION DOSE REDUCTION: This exam was performed according to the departmental dose-optimization program which includes automated exposure control, adjustment of the mA and/or kV according to patient size and/or use of iterative reconstruction technique. CONTRAST:  75mL OMNIPAQUE  IOHEXOL  350 MG/ML SOLN COMPARISON:  August 16, 2023 FINDINGS: Cardiovascular: Satisfactory opacification of the pulmonary arteries to the segmental level. No evidence of pulmonary embolism. Normal heart size. No pericardial effusion. Mediastinum/Nodes: No enlarged mediastinal, hilar, or axillary lymph nodes. Thyroid  gland, trachea, and esophagus demonstrate no significant findings. Lungs/Pleura: Extensive bilateral pulmonary infiltrates which correlates with chronic interstitial lung disease with thickening of the interlobar interlobular septi with persistent right pleural effusion right lower lobe infiltrates and atelectasis of the inferior medial segment of the right  lower lobe that could correlate with superimposed pneumonia. There is significant retraction of the right lower lobe  pulmonary vessels that could correlate with trapped lung as well. No suspicious pulmonary nodules. Upper Abdomen: No acute abnormality. Musculoskeletal: No chest wall abnormality. No acute or significant osseous findings. Review of the MIP images confirms the above findings. IMPRESSION: *No evidence of pulmonary embolism. *Extensive bilateral pulmonary infiltrates which correlates with chronic interstitial lung disease with thickening of the interlobar interlobular septi with persistent right pleural effusion right lower lobe infiltrates and atelectasis of the inferior medial segment of the right lower lobe that could correlate with superimposed pneumonia. There is significant retraction of the right lower lobe pulmonary vessels that could correlate with trapped lung as well. Electronically Signed   By: Fredrich Jefferson M.D.   On: 09/25/2023 12:56   DG Chest Port 1 View Result Date: 09/25/2023 CLINICAL DATA:  Questionable sepsis EXAM: PORTABLE CHEST 1 VIEW COMPARISON:  Sep 22, 2023 FINDINGS: Persistent unchanged bilateral basilar infiltrates atelectasis and right pleural effusion and reaction. No change in the right IJ Infusaport catheter The heart normal size IMPRESSION: Persistent unchanged bilateral basilar infiltrates atelectasis and right pleural effusion and reaction. Electronically Signed   By: Fredrich Jefferson M.D.   On: 09/25/2023 10:34     Scheduled Meds:  amLODipine   5 mg Oral Daily   aspirin  EC  81 mg Oral Q breakfast   Chlorhexidine  Gluconate Cloth  6 each Topical Daily   dextromethorphan-guaiFENesin   1 tablet Oral BID   DULoxetine   20 mg Oral BID   feeding supplement  237 mL Oral BID BM   fluticasone  furoate-vilanterol  1 puff Inhalation Daily   folic acid   1 mg Oral Daily   heparin   5,000 Units Subcutaneous Q8H   hydrOXYzine  25 mg Oral QHS   ipratropium-albuterol   3 mL  Nebulization Q6H   levothyroxine   100 mcg Oral Daily   lumateperone tosylate  42 mg Oral Daily   methylPREDNISolone  (SOLU-MEDROL ) injection  40 mg Intravenous Q12H   pantoprazole   40 mg Oral Daily   polyethylene glycol powder  17 g Oral Daily   potassium chloride   40 mEq Oral Q3H   senna-docusate  2 tablet Oral QHS   sodium chloride  flush  3 mL Intravenous Q12H   sodium chloride  flush  3 mL Intravenous Q12H   Continuous Infusions:  sodium chloride      azithromycin  500 mg (09/26/23 0923)   cefTRIAXone (ROCEPHIN)  IV       LOS: 1 day    Colin Dawley M.D on 09/26/2023 at 10:16 AM  Go to [email or web address] - for contact info  [facility] - Office  [phone]  If 7PM-7AM, please contact night-coverage [email or web address] 09/26/2023, 10:16 AM

## 2023-09-26 NOTE — Plan of Care (Signed)

## 2023-09-26 NOTE — Progress Notes (Signed)
 Palliative:  ***  Yong Channel, NP Palliative Medicine Team Pager [phone] (Please see [email or web address] for schedule) Team Phone [phone]

## 2023-09-26 NOTE — TOC Initial Note (Signed)
 Transition of Care [facility]) - Initial/Assessment Note    Patient Details  Name: Joseph Atkins MRN: [id number] Date of Birth: Oct 22, 1960  Transition of Care [facility]) CM/SW Contact:    Orelia Binet, RN Phone Number: 09/26/2023, 12:02 PM  Clinical Narrative:   Patient admitted with pneumonia, considered to be a high risk for readmission. Patient is very ill on 13L today. Palliative was consulted and will continue discussion with patient and wife. TOC following for discharge plan.                 Expected Discharge Plan: Home/Self Care Barriers to Discharge: Continued Medical Work up   Patient Goals and CMS Choice Patient states their goals for this hospitalization and ongoing recovery are:: to get better CMS Medicare.gov Compare Post Acute Care list provided to:: Patient Choice offered to / list presented to : Patient    Expected Discharge Plan and Services     Living arrangements for the past 2 months: Single Family Home                   Prior Living Arrangements/Services Living arrangements for the past 2 months: Single Family Home Lives with:: Spouse Patient language and need for interpreter reviewed:: Yes        Need for Family Participation in Patient Care: Yes (Comment) Care giver support system in place?: Yes (comment) Current home services: DME (Oxygen ) Criminal Activity/Legal Involvement Pertinent to Current Situation/Hospitalization: No - Comment as needed  Activities of Daily Living   ADL Screening (condition at time of admission) Independently performs ADLs?: No Does the patient have a NEW difficulty with bathing/dressing/toileting/self-feeding that is expected to last >3 days?: No Does the patient have a NEW difficulty with getting in/out of bed, walking, or climbing stairs that is expected to last >3 days?: Yes (Initiates electronic notice to provider for possible PT consult) Does the patient have a NEW difficulty with communication that is expected to last >3  days?: No Is the patient deaf or have difficulty hearing?: No Does the patient have difficulty seeing, even when wearing glasses/contacts?: No Does the patient have difficulty concentrating, remembering, or making decisions?: No  Permission Sought/Granted            Permission granted to share info w Relationship: wife     Emotional Assessment     Affect (typically observed): Accepting Orientation: : Oriented to Self, Oriented to Place, Oriented to  Time, Oriented to Situation Alcohol / Substance Use: Not Applicable Psych Involvement: No (comment)  Admission diagnosis:  Hypoxia [R09.02] Acute respiratory failure with hypoxia (HCC) [J96.01] Pneumonia of both lungs due to infectious organism, unspecified part of lung [J18.9] Patient Active Problem List   Diagnosis Date Noted   Acute respiratory failure with hypoxia (HCC) 09/25/2023   CAP (community acquired pneumonia) 09/25/2023   Current moderate episode of major depressive disorder, unspecified whether recurrent (HCC) 01/05/2023   COPD exacerbation (HCC) 03/24/2020   Encounter for antineoplastic immunotherapy 02/03/2020   Port-A-Cath in place 12/23/2019   Encounter for antineoplastic chemotherapy 12/02/2019   Adenocarcinoma of right lung, stage 4 with Brain Mets/nonsmall cell adenocarcinoma of his lun 11/14/2019   Goals of care, counseling/discussion 11/14/2019   Malignant neoplasm metastatic to brain /nonsmall cell adenocarcinoma of his lun 11/03/2019   Right lower lobe lung mass 10/24/2019   Mediastinal adenopathy 10/24/2019   Acute bronchitis 01/31/2017   Essential hypertension 08/28/2014   Vitamin D  deficiency 07/22/2014   Hyperlipidemia 07/15/2014   GERD (gastroesophageal reflux disease)  07/15/2014   Hypothyroidism 07/15/2014   Smoker 07/15/2014   Constipation 03/07/2013   PCP:  [name], NP Pharmacy:   [id number] - [place], [place] - [address] [place]  [address] Phone: [phone] Fax: [phone]     Social Drivers of Health (SDOH) Social History: SDOH Screenings   Food Insecurity: No Food Insecurity (09/25/2023)  Housing: Low Risk  (09/25/2023)  Transportation Needs: No Transportation Needs (09/25/2023)  Utilities: Not At Risk (09/25/2023)  Alcohol Screen: Low Risk  (10/31/2019)  Financial Resource Strain: Medium Risk (10/31/2019)  Physical Activity: Insufficiently Active (10/31/2019)  Social Connections: Moderately Isolated (10/31/2019)  Stress: Stress Concern Present (10/31/2019)  Tobacco Use: Medium Risk (09/25/2023)   SDOH Interventions:     Readmission Risk Interventions    09/26/2023   11:59 AM  Readmission Risk Prevention Plan  Transportation Screening Complete  PCP or Specialist Appt within 3-5 Days Not Complete  HRI or Home Care Consult Complete  Social Work Consult for Recovery Care Planning/Counseling Complete  Palliative Care Screening Complete  Medication Review Oceanographer) Complete

## 2023-09-27 ENCOUNTER — Telehealth: Payer: Self-pay

## 2023-09-27 ENCOUNTER — Encounter (HOSPITAL_COMMUNITY): Payer: Self-pay | Admitting: Family Medicine

## 2023-09-27 ENCOUNTER — Inpatient Hospital Stay (HOSPITAL_COMMUNITY)

## 2023-09-27 ENCOUNTER — Inpatient Hospital Stay: Attending: Internal Medicine

## 2023-09-27 DIAGNOSIS — Z515 Encounter for palliative care: Secondary | ICD-10-CM | POA: Diagnosis not present

## 2023-09-27 DIAGNOSIS — J9601 Acute respiratory failure with hypoxia: Secondary | ICD-10-CM | POA: Diagnosis not present

## 2023-09-27 DIAGNOSIS — C3491 Malignant neoplasm of unspecified part of right bronchus or lung: Secondary | ICD-10-CM | POA: Diagnosis not present

## 2023-09-27 DIAGNOSIS — Z7189 Other specified counseling: Secondary | ICD-10-CM | POA: Diagnosis not present

## 2023-09-27 LAB — BODY FLUID CELL COUNT WITH DIFFERENTIAL
Lymphs, Fluid: 50 %
Monocyte-Macrophage-Serous Fluid: 20 % — ABNORMAL LOW (ref 50–90)
Neutrophil Count, Fluid: 30 % — ABNORMAL HIGH (ref 0–25)
Total Nucleated Cell Count, Fluid: 418 uL (ref 0–1000)

## 2023-09-27 LAB — GLUCOSE, CAPILLARY: Glucose-Capillary: 118 mg/dL — ABNORMAL HIGH (ref 70–99)

## 2023-09-27 LAB — RENAL FUNCTION PANEL
Albumin: 2.1 g/dL — ABNORMAL LOW (ref 3.5–5.0)
Anion gap: 7 (ref 5–15)
BUN: 10 mg/dL (ref 8–23)
CO2: 23 mmol/L (ref 22–32)
Calcium: 8 mg/dL — ABNORMAL LOW (ref 8.9–10.3)
Chloride: 101 mmol/L (ref 98–111)
Creatinine, Ser: 0.64 mg/dL (ref 0.61–1.24)
GFR, Estimated: >60 mL/min (ref >60)
Glucose, Bld: 123 mg/dL — ABNORMAL HIGH (ref 70–99)
Phosphorus: 3.9 mg/dL (ref 2.5–4.6)
Potassium: 4.7 mmol/L (ref 3.5–5.1)
Sodium: 131 mmol/L — ABNORMAL LOW (ref 135–145)

## 2023-09-27 LAB — CBC
HCT: 25.2 % — ABNORMAL LOW (ref 39.0–52.0)
Hemoglobin: 8.1 g/dL — ABNORMAL LOW (ref 13.0–17.0)
MCH: 30.9 pg (ref 26.0–34.0)
MCHC: 32.1 g/dL (ref 30.0–36.0)
MCV: 96.2 fL (ref 80.0–100.0)
Platelets: 152 10*3/uL (ref 150–400)
RBC: 2.62 MIL/uL — ABNORMAL LOW (ref 4.22–5.81)
RDW: 17.1 % — ABNORMAL HIGH (ref 11.5–15.5)
WBC: 3.3 10*3/uL — ABNORMAL LOW (ref 4.0–10.5)
nRBC: 0 % (ref 0.0–0.2)

## 2023-09-27 LAB — LACTATE DEHYDROGENASE, PLEURAL OR PERITONEAL FLUID: LD, Fluid: 124 U/L — ABNORMAL HIGH (ref 3–23)

## 2023-09-27 LAB — GRAM STAIN: Gram Stain: NONE SEEN

## 2023-09-27 LAB — LEGIONELLA PNEUMOPHILA SEROGP 1 UR AG: L. pneumophila Serogp 1 Ur Ag: NEGATIVE

## 2023-09-27 LAB — PROTEIN, PLEURAL OR PERITONEAL FLUID: Total protein, fluid: <3 g/dL

## 2023-09-27 MED ORDER — LIDOCAINE HCL (PF) 2 % IJ SOLN
INTRAMUSCULAR | Status: AC
  Filled 2023-09-27: qty 10

## 2023-09-27 MED ORDER — LEVALBUTEROL HCL 0.63 MG/3ML IN NEBU
0.6300 mg | INHALATION_SOLUTION | Freq: Four times a day (QID) | RESPIRATORY_TRACT | Status: DC
  Administered 2023-09-27 – 2023-09-29 (×7): 0.63 mg via RESPIRATORY_TRACT
  Filled 2023-09-27 (×9): qty 3

## 2023-09-27 MED ORDER — AMLODIPINE BESYLATE 5 MG PO TABS: 5.0000 mg | ORAL_TABLET | Freq: Every day | ORAL | Status: DC

## 2023-09-27 MED ORDER — IPRATROPIUM BROMIDE 0.02 % IN SOLN
0.5000 mg | Freq: Four times a day (QID) | RESPIRATORY_TRACT | Status: DC
  Administered 2023-09-27 – 2023-09-29 (×7): 0.5 mg via RESPIRATORY_TRACT
  Filled 2023-09-27 (×9): qty 2.5

## 2023-09-27 MED ORDER — BUDESONIDE 0.25 MG/2ML IN SUSP
0.2500 mg | Freq: Two times a day (BID) | RESPIRATORY_TRACT | Status: DC
  Administered 2023-09-27 – 2023-10-01 (×9): 0.25 mg via RESPIRATORY_TRACT
  Filled 2023-09-27 (×9): qty 2

## 2023-09-27 NOTE — Progress Notes (Signed)
 PROGRESS NOTE     Joseph Atkins, is a 63 y.o. male, DOB - Apr 23, 1961, ZOX:[id number]  Admit date - 09/25/2023   Admitting Physician Courage Quintella Buck, MD  Outpatient Primary MD for the patient is [name], NP  LOS - 2  Chief Complaint  Patient presents with   Shortness of Breath       Subjective: Joseph Atkins today has no fevers, no emesis,  No chest pain,   -The patient was seen and examined this morning, stable no acute distress, O2 demand reduced from 12 L to 10 L of oxygen , satting 93% still tachypneic, tachycardic blood pressure borderline 118/32 awake alert following commands wife present at bedside Hemoglobin dropped to 8.1 with no signs of bleeding    Brief Narrative:  Joseph Atkins  os 63 y.o. male with medical history significant for GERD, HTN, hypothyroidism, HLD, alcohol and tobacco abuse, stage IV lung cancer with brain mets, recent diagnosis of community-acquired pneumonia on 09/22/2023, treated on Levaquin  (Levaquin  day # 4 at the time of admission) as outpatient but clinically radiologically appears to have worsened. - Admitted 09/25/2023 with acute hypoxic respiratory failure secondary to worsening CAP.        -Assessment and Plan:  CAP--with acute respiratory failure recent diagnosis of community-acquired pneumonia on 09/22/2023, treated on Levaquin  (Levaquin  day # 4) as outpatient but clinically radiologically appears to have worsened. -WBC 8.6, 3.3 - Lactic acid 1.6 >> 1.8 - RSV, COVID and flu are negative --Does Not meet sepsis criteria - Please see chest x-ray from 09/22/2023 and CTA chest from 09/25/2023 09/26/23 - Desaturates easily with positional change and coughing episodes --Oxygen  has been titrated back up to 12 L per nasal cannula -c/n IV Rocephin/azithromycin  along with bronchodilators and mucolytics pending culture data 09/27/23 -Repeat chest x-ray on 09/27/2023-  Slight increase in moderate right pleural effusion.  - Will attempt  thoracentesis - Continue IV  antibiotics and steroids   Acute hypoxic respiratory failure--- POA:  hypoxic with O2 sats of 72% on room air required up to 15 L of oxygen  initially -Not previously on oxygen  -Will most likely need oxygen  at discharge ---Down from 12 L to 10 L of oxygen  satting 93%   Stage IV (T3, N0, M1C) non-small cell lung cancer, adenocarcinoma  -with mets to the brain -outpatient follow-up Dr. Randall Bush advised -- Hold Keytruda  due to acute infection  Social/Ethics--- palliative care consult appreciated - Patient remains full code   Chronic anemia of malignancy and chemotherapy- -baseline hemoglobin 10, dropped to 8.1, no signs of bleeding-monitoring Hgb currently 10.0 which is higher than recent baseline - Continue folic acid  - Monitor Hgb closely and transfuse as indicated   Hypothyroidism--continue levothyroxine    GERD--continue Protonix    Tobacco Abuse/acute COPD exacerbation --- due to #1 above - continue bronchodilators - Solu-Medrol  as ordered   Depression/Anxiety--stable,  --continue Cymbalta  May use hydroxyzine as needed  Status is: Inpatient   Disposition: The patient is from: Home              Anticipated d/c is to: Home              Anticipated d/c date is: 2 days              Patient currently is not medically stable to d/c. Barriers: Not Clinically Stable-   Code Status :  -  Code Status: Full Code   Family Communication:   (patient is alert, awake and coherent)  Discussed with wife at  bedside  DVT Prophylaxis  :   - SCDs  heparin  injection 5,000 Units Start: 09/25/23 2200 SCDs Start: 09/25/23 1838 Place TED hose Start: 09/25/23 1838   Lab Results  Component Value Date   PLT 152 09/27/2023    Inpatient Medications  Scheduled Meds:  amLODipine   5 mg Oral Daily   aspirin  EC  81 mg Oral Q breakfast   budesonide (PULMICORT) nebulizer solution  0.25 mg Nebulization BID   Chlorhexidine  Gluconate Cloth  6 each Topical  Daily   dextromethorphan-guaiFENesin   1 tablet Oral BID   DULoxetine   20 mg Oral BID   feeding supplement  237 mL Oral BID BM   fluticasone  furoate-vilanterol  1 puff Inhalation Daily   folic acid   1 mg Oral Daily   heparin   5,000 Units Subcutaneous Q8H   hydrOXYzine  25 mg Oral QHS   ipratropium-albuterol   3 mL Nebulization Q6H   levothyroxine   100 mcg Oral Daily   lumateperone tosylate  42 mg Oral QHS   methylPREDNISolone  (SOLU-MEDROL ) injection  40 mg Intravenous Q12H   pantoprazole   40 mg Oral Daily   polyethylene glycol powder  17 g Oral Daily   senna-docusate  2 tablet Oral QHS   sodium chloride  flush  3 mL Intravenous Q12H   sodium chloride  flush  3 mL Intravenous Q12H   Continuous Infusions:  azithromycin  500 mg (09/27/23 0955)   cefTRIAXone (ROCEPHIN)  IV 2 g (09/27/23 0911)   PRN Meds:.acetaminophen  **OR** acetaminophen , albuterol , bisacodyl, HYDROcodone  bit-homatropine, ondansetron  **OR** ondansetron  (ZOFRAN ) IV, polyethylene glycol, sodium chloride  flush, temazepam    Anti-infectives (From admission, onward)    Start     Dose/Rate Route Frequency Ordered Stop   09/26/23 1000  cefTRIAXone (ROCEPHIN) 2 g in sodium chloride  0.9 % 100 mL IVPB        2 g 200 mL/hr over 30 Minutes Intravenous Every 24 hours 09/25/23 1142     09/26/23 1000  azithromycin  (ZITHROMAX ) 500 mg in sodium chloride  0.9 % 250 mL IVPB        500 mg 250 mL/hr over 60 Minutes Intravenous Every 24 hours 09/25/23 1142     09/25/23 0945  cefTRIAXone (ROCEPHIN) 1 g in sodium chloride  0.9 % 100 mL IVPB        1 g 200 mL/hr over 30 Minutes Intravenous  Once 09/25/23 0943 09/25/23 1117   09/25/23 0945  azithromycin  (ZITHROMAX ) 500 mg in sodium chloride  0.9 % 250 mL IVPB        500 mg 250 mL/hr over 60 Minutes Intravenous  Once 09/25/23 0943 09/25/23 1251            Objective: Vitals:   09/27/23 0600 09/27/23 0700 09/27/23 0754 09/27/23 0800  BP: 109/70 (!) 118/32    Pulse: 99 (!) 114  (!) 116   Resp: (!) 24 (!) 27  (!) 24  Temp:   (!) 95.9 F (35.5 C)   TempSrc:   Axillary   SpO2: 92% (!) 88%  93%  Weight:      Height:        Intake/Output Summary (Last 24 hours) at 09/27/2023 1129 Last data filed at 09/27/2023 0700 Gross per 24 hour  Intake 240 ml  Output 1050 ml  Net -810 ml   Filed Weights   09/25/23 0938 09/25/23 1244 09/27/23 0424  Weight: 75.8 kg 72 kg 72 kg        General:  AAO x 3,  cooperative, no distress;   HEENT:  Normocephalic,  PERRL, otherwise with in Normal limits   Neuro:  CNII-XII intact. , normal motor and sensation, reflexes intact   Lungs:   Clear to auscultation BL, Respirations unlabored,  Diffuse rhonchi, rales, minimal wheezing, minimal crackles in lower lobes  Cardio:    S1/S2, RRR, No murmure, No Rubs or Gallops   Abdomen:  Soft, non-tender, bowel sounds active all four quadrants, no guarding or peritoneal signs.  Muscular  skeletal:  Limited exam -global generalized weaknesses - in bed, able to move all 4 extremities,   2+ pulses,  symmetric, No pitting edema  Skin:  Dry, warm to touch, negative for any Rashes,  Wounds: Please see nursing documentation        eralized weakness no new focal deficits, no tremors  Data Reviewed: I have personally reviewed following labs and imaging studies  CBC: Recent Labs  Lab 09/21/23 1446 09/25/23 0956 09/27/23 0456  WBC 4.4 8.6 3.3*  NEUTROABS 3.1 7.2  --   HGB 7.8* 10.0* 8.1*  HCT 23.2* 31.1* 25.2*  MCV 90.3 94.5 96.2  PLT 108* 188 152   Basic Metabolic Panel: Recent Labs  Lab 09/21/23 1446 09/25/23 0956 09/26/23 0444 09/27/23 0354  NA 126* 126* 127* 131*  K 3.5 3.8 3.4* 4.7  CL 92* 92* 96* 101  CO2 25 18* 23 23  GLUCOSE 105* 118* 137* 123*  BUN 9 8 8 10   CREATININE 0.95 0.94 0.73 0.64  CALCIUM  8.4* 8.2* 7.8* 8.0*  PHOS  --   --   --  3.9   GFR: Estimated Creatinine Clearance: 93.8 mL/min (by C-G formula based on SCr of 0.64 mg/dL). Liver Function Tests: Recent Labs   Lab 09/21/23 1446 09/25/23 0956 09/27/23 0354  AST 19 37  --   ALT 15 17  --   ALKPHOS 97 99  --   BILITOT 1.2 1.9*  --   PROT 6.7 6.6  --   ALBUMIN 3.3* 2.5* 2.1*    Recent Results (from the past 240 hours)  Blood Culture (routine x 2)     Status: None (Preliminary result)   Collection Time: 09/25/23  9:42 AM   Specimen: BLOOD  Result Value Ref Range Status   Specimen Description BLOOD RIGHT ARM  Final   Special Requests   Final    BOTTLES DRAWN AEROBIC AND ANAEROBIC Blood Culture adequate volume   Culture   Final    NO GROWTH 2 DAYS Performed at [facility], [address] [place], [place] [address]    Report Status PENDING  Incomplete  Blood Culture (routine x 2)     Status: None (Preliminary result)   Collection Time: 09/25/23  9:56 AM   Specimen: BLOOD  Result Value Ref Range Status   Specimen Description BLOOD RIGHT HAND  Final   Special Requests   Final    BOTTLES DRAWN AEROBIC AND ANAEROBIC Blood Culture results may not be optimal due to an inadequate volume of blood received in culture bottles   Culture   Final    NO GROWTH 2 DAYS Performed at [facility], [address] [place], [place] [address]    Report Status PENDING  Incomplete  Resp panel by RT-PCR (RSV, Flu A&B, Covid) Anterior Nasal Swab     Status: None   Collection Time: 09/25/23 11:00 AM   Specimen: Anterior Nasal Swab  Result Value Ref Range Status   SARS Coronavirus 2 by RT PCR NEGATIVE NEGATIVE Final    Comment: (NOTE) SARS-CoV-2 target nucleic acids  are NOT DETECTED.  The SARS-CoV-2 RNA is generally detectable in upper respiratory specimens during the acute phase of infection. The lowest concentration of SARS-CoV-2 viral copies this assay can detect is 138 copies/mL. A negative result does not preclude SARS-Cov-2 infection and should not be used as the sole basis for treatment or other patient management decisions. A negative result may occur with  improper specimen collection/handling,  submission of specimen other than nasopharyngeal swab, presence of viral mutation(s) within the areas targeted by this assay, and inadequate number of viral copies(<138 copies/mL). A negative result must be combined with clinical observations, patient history, and epidemiological information. The expected result is Negative.  Fact Sheet for Patients:  BloggerCourse.com  Fact Sheet for Healthcare Providers:  SeriousBroker.it  This test is no t yet approved or cleared by the [place]  FDA and  has been authorized for detection and/or diagnosis of SARS-CoV-2 by FDA under an Emergency Use Authorization (EUA). This EUA will remain  in effect (meaning this test can be used) for the duration of the COVID-19 declaration under Section 564(b)(1) of the Act, 21 U.S.C.section 360bbb-3(b)(1), unless the authorization is terminated  or revoked sooner.       Influenza A by PCR NEGATIVE NEGATIVE Final   Influenza B by PCR NEGATIVE NEGATIVE Final    Comment: (NOTE) The Xpert Xpress SARS-CoV-2/FLU/RSV plus assay is intended as an aid in the diagnosis of influenza from Nasopharyngeal swab specimens and should not be used as a sole basis for treatment. Nasal washings and aspirates are unacceptable for Xpert Xpress SARS-CoV-2/FLU/RSV testing.  Fact Sheet for Patients: BloggerCourse.com  Fact Sheet for Healthcare Providers: SeriousBroker.it  This test is not yet approved or cleared by the [place]  FDA and has been authorized for detection and/or diagnosis of SARS-CoV-2 by FDA under an Emergency Use Authorization (EUA). This EUA will remain in effect (meaning this test can be used) for the duration of the COVID-19 declaration under Section 564(b)(1) of the Act, 21 U.S.C. section 360bbb-3(b)(1), unless the authorization is terminated or revoked.     Resp Syncytial Virus by PCR NEGATIVE  NEGATIVE Final    Comment: (NOTE) Fact Sheet for Patients: BloggerCourse.com  Fact Sheet for Healthcare Providers: SeriousBroker.it  This test is not yet approved or cleared by the [place]  FDA and has been authorized for detection and/or diagnosis of SARS-CoV-2 by FDA under an Emergency Use Authorization (EUA). This EUA will remain in effect (meaning this test can be used) for the duration of the COVID-19 declaration under Section 564(b)(1) of the Act, 21 U.S.C. section 360bbb-3(b)(1), unless the authorization is terminated or revoked.  Performed at [facility], [address] [place], [place] [address]   MRSA Next Gen by PCR, Nasal     Status: None   Collection Time: 09/25/23 12:42 PM   Specimen: Nasal Mucosa; Nasal Swab  Result Value Ref Range Status   MRSA by PCR Next Gen NOT DETECTED NOT DETECTED Final    Comment: (NOTE) The GeneXpert MRSA Assay (FDA approved for NASAL specimens only), is one component of a comprehensive MRSA colonization surveillance program. It is not intended to diagnose MRSA infection nor to guide or monitor treatment for MRSA infections. Test performance is not FDA approved in patients less than 4 years old. Performed at [facility], [address] [place], [place] [address]     Radiology Studies: DG CHEST PORT 1 VIEW Result Date: 09/27/2023 CLINICAL DATA:  [id number] Dyspnea and respiratory abnormalities [id number] EXAM: PORTABLE CHEST - 1  VIEW COMPARISON:  09/25/2023 FINDINGS: Patchy alveolar opacities involving bases more than apices, generally stable since previous. Moderate right pleural effusion appears slightly increased. Stable right IJ port catheter. Heart size normal. Visualized bones unremarkable. IMPRESSION: 1. Slight increase in moderate right pleural effusion. 2. Stable patchy alveolar opacities. Electronically Signed   By: Nicoletta Barrier M.D.   On: 09/27/2023 08:00   CT Angio Chest PE W and/or Wo  Contrast Result Date: 09/25/2023 CLINICAL DATA:  Pulmonary embolism suspected EXAM: CT ANGIOGRAPHY CHEST WITH CONTRAST TECHNIQUE: Multidetector CT imaging of the chest was performed using the standard protocol during bolus administration of intravenous contrast. Multiplanar CT image reconstructions and MIPs were obtained to evaluate the vascular anatomy. RADIATION DOSE REDUCTION: This exam was performed according to the departmental dose-optimization program which includes automated exposure control, adjustment of the mA and/or kV according to patient size and/or use of iterative reconstruction technique. CONTRAST:  75mL OMNIPAQUE  IOHEXOL  350 MG/ML SOLN COMPARISON:  August 16, 2023 FINDINGS: Cardiovascular: Satisfactory opacification of the pulmonary arteries to the segmental level. No evidence of pulmonary embolism. Normal heart size. No pericardial effusion. Mediastinum/Nodes: No enlarged mediastinal, hilar, or axillary lymph nodes. Thyroid  gland, trachea, and esophagus demonstrate no significant findings. Lungs/Pleura: Extensive bilateral pulmonary infiltrates which correlates with chronic interstitial lung disease with thickening of the interlobar interlobular septi with persistent right pleural effusion right lower lobe infiltrates and atelectasis of the inferior medial segment of the right lower lobe that could correlate with superimposed pneumonia. There is significant retraction of the right lower lobe pulmonary vessels that could correlate with trapped lung as well. No suspicious pulmonary nodules. Upper Abdomen: No acute abnormality. Musculoskeletal: No chest wall abnormality. No acute or significant osseous findings. Review of the MIP images confirms the above findings. IMPRESSION: *No evidence of pulmonary embolism. *Extensive bilateral pulmonary infiltrates which correlates with chronic interstitial lung disease with thickening of the interlobar interlobular septi with persistent right pleural effusion  right lower lobe infiltrates and atelectasis of the inferior medial segment of the right lower lobe that could correlate with superimposed pneumonia. There is significant retraction of the right lower lobe pulmonary vessels that could correlate with trapped lung as well. Electronically Signed   By: Fredrich Jefferson M.D.   On: 09/25/2023 12:56     Scheduled Meds:  amLODipine   5 mg Oral Daily   aspirin  EC  81 mg Oral Q breakfast   budesonide (PULMICORT) nebulizer solution  0.25 mg Nebulization BID   Chlorhexidine  Gluconate Cloth  6 each Topical Daily   dextromethorphan-guaiFENesin   1 tablet Oral BID   DULoxetine   20 mg Oral BID   feeding supplement  237 mL Oral BID BM   fluticasone  furoate-vilanterol  1 puff Inhalation Daily   folic acid   1 mg Oral Daily   heparin   5,000 Units Subcutaneous Q8H   hydrOXYzine  25 mg Oral QHS   ipratropium-albuterol   3 mL Nebulization Q6H   levothyroxine   100 mcg Oral Daily   lumateperone tosylate  42 mg Oral QHS   methylPREDNISolone  (SOLU-MEDROL ) injection  40 mg Intravenous Q12H   pantoprazole   40 mg Oral Daily   polyethylene glycol powder  17 g Oral Daily   senna-docusate  2 tablet Oral QHS   sodium chloride  flush  3 mL Intravenous Q12H   sodium chloride  flush  3 mL Intravenous Q12H   Continuous Infusions:  azithromycin  500 mg (09/27/23 0955)   cefTRIAXone (ROCEPHIN)  IV 2 g (09/27/23 0911)     LOS: 2 days  Bobbetta Burnet M.D on 09/27/2023 at 11:29 AM  Go to [email or web address] - for contact info  [facility]  Critical care time spent 55 minutes-seeing evaluate patient, reviewing all medical records, labs, meds, drawn plan of care   - Office  [phone] If 7PM-7AM, please contact night-coverage [email or web address] 09/27/2023, 11:29 AM

## 2023-09-27 NOTE — Plan of Care (Signed)

## 2023-09-27 NOTE — Procedures (Signed)
Ultrasound-guided diagnostic and therapeutic right sided thoracentesis performed yielding 700 milliliters of straw colored fluid. No immediate complications.   Diagnostic fluid was sent to the lab for further analysis. Follow-up chest x-ray pending. EBL is < 2 ml.

## 2023-09-27 NOTE — Progress Notes (Signed)
 Patient tolerated Right Thoracentesis procedure well today and 700 mL of pleural fluid removed and sent to labs for processing by Evalyn Hillier from ultrasound. Patient verbalized understanding of post procedure instructions and had no acute distress noted at completion of procedure. Portable chest xray ordered.

## 2023-09-27 NOTE — Hospital Course (Signed)
 Joseph Atkins is a 63 y.o. male with medical history significant for GERD, HTN, hypothyroidism, HLD, alcohol and tobacco abuse, stage IV lung cancer with brain mets, recent diagnosis of community-acquired pneumonia on 09/22/2023, treated on Levaquin  (Levaquin  day # 4 at the time of admission) as outpatient but clinically radiologically appears to have worsened. - Admitted 09/25/2023 with acute hypoxic respiratory failure secondary to worsening CAP.

## 2023-09-27 NOTE — Telephone Encounter (Signed)
 Spoke with patient's wife, Stana Ear, this morning regarding appointments scheduled for tomorrow. She reported that the patient is currently an inpatient in the ICU at [facility]. Stana Ear stated that the patient's breathing had worsened, with low blood pressure and low oxygen  levels. Stana Ear stated the patient received multiple steroid injections while admitted, a chest scan was performed, showing improvement in pneumonia; however, there is fluid present in the right lung. She states the plan is in place to drain the fluid today, followed by another chest scan.  Stana Ear also reported that the patient is overall feeling better, with improved skin color, and is drinking fluids and eating well. I informed Stana Ear that this information would be communicated to Dr. Marguerita Shih and that appointments will be canceled if deemed necessary.

## 2023-09-27 NOTE — Progress Notes (Addendum)
 Palliative:  HPI: 63 y.o. male  with past medical history of stage IV NSCLC adenocarcinoma with mets to brain (follows with Dr. Marguerita Shih, Dr. Mark Sil, Dr. Jeryl Moris), hypothyroidism, IBS, small bowel obstruction, diverticulosis, GERD, substance use (not in many years) admitted on 09/25/2023 with sepsis pneumonia with respiratory decline.    I met today with Joseph Atkins and Joseph Atkins. Joseph Atkins is s/p thoracentesis 700 mL off. He has significant improvement in oxygen  requirements - down to 2L during my visit! He is feeling much better.   Joseph Atkins is prepared to look at Living Will. We discussed code status and Joseph Atkins and Joseph Atkins both endorse DNR/DNI. They wish to continue with treatment otherwise to prolong life. Pat knows what his future holds and that he will have worsening status and functional status into the future. He is accepting of his path and shares that he has made his peace and is ready when his time comes.   We discussed and filled out Advance Directive according to Joseph Atkins's wishes. He elects wife Joseph Atkins as primary HCPOA with brother Joseph Atkins as backup. Discussed Living Will questions in detail with risks, benefits, and expectations. He does not desire life prolonging measures in the 3 scenarios listed and he does not desire artificial feeding in these scenarios either.   Joseph Atkins shares that he has slept very well with Caplyta and Restoril . He would like to continue Restoril  at home. He is hoping he may be able to sleep with Restoril  and ultimately stop Caplyta.   All questions/concerns addressed. Emotional support provided.   Exam: Alert, oriented. Breathing improved on 2L oxygen . Abd soft. Moves all extremities.   Plan: - DNR/DNI - Spiritual care consult to help complete Advance Directive  60 min  Joseph Grayer, NP Palliative Medicine Team Pager [phone] (Please see [email or web address] for schedule) Team Phone [phone]

## 2023-09-28 ENCOUNTER — Inpatient Hospital Stay

## 2023-09-28 ENCOUNTER — Inpatient Hospital Stay: Admitting: Internal Medicine

## 2023-09-28 DIAGNOSIS — Z515 Encounter for palliative care: Secondary | ICD-10-CM | POA: Diagnosis not present

## 2023-09-28 DIAGNOSIS — J9601 Acute respiratory failure with hypoxia: Secondary | ICD-10-CM | POA: Diagnosis not present

## 2023-09-28 DIAGNOSIS — C3491 Malignant neoplasm of unspecified part of right bronchus or lung: Secondary | ICD-10-CM | POA: Diagnosis not present

## 2023-09-28 DIAGNOSIS — Z7189 Other specified counseling: Secondary | ICD-10-CM | POA: Diagnosis not present

## 2023-09-28 DIAGNOSIS — J189 Pneumonia, unspecified organism: Secondary | ICD-10-CM | POA: Diagnosis not present

## 2023-09-28 LAB — BRAIN NATRIURETIC PEPTIDE: B Natriuretic Peptide: 195 pg/mL — ABNORMAL HIGH (ref 0.0–100.0)

## 2023-09-28 MED ORDER — AZITHROMYCIN 250 MG PO TABS
500.0000 mg | ORAL_TABLET | Freq: Every day | ORAL | Status: DC
  Administered 2023-09-28: 500 mg via ORAL
  Filled 2023-09-28: qty 2

## 2023-09-28 MED ORDER — PREDNISONE 20 MG PO TABS
50.0000 mg | ORAL_TABLET | Freq: Every day | ORAL | Status: DC
  Administered 2023-09-28 – 2023-09-29 (×2): 50 mg via ORAL
  Filled 2023-09-28 (×3): qty 1

## 2023-09-28 MED ORDER — LEVOFLOXACIN 750 MG PO TABS
750.0000 mg | ORAL_TABLET | Freq: Every day | ORAL | Status: DC
  Administered 2023-09-29 – 2023-10-01 (×3): 750 mg via ORAL
  Filled 2023-09-28 (×3): qty 1

## 2023-09-28 MED ORDER — ALBUMIN HUMAN 25 % IV SOLN
12.5000 g | Freq: Once | INTRAVENOUS | Status: AC
  Administered 2023-09-28: 12.5 g via INTRAVENOUS
  Filled 2023-09-28: qty 50

## 2023-09-28 MED ORDER — FUROSEMIDE 10 MG/ML IJ SOLN
20.0000 mg | Freq: Two times a day (BID) | INTRAMUSCULAR | Status: DC
  Administered 2023-09-28 – 2023-09-29 (×3): 20 mg via INTRAVENOUS
  Filled 2023-09-28 (×3): qty 2

## 2023-09-28 NOTE — Progress Notes (Signed)
 Patient wants to sleep and is resting well. O2 sats 99% on 5 lpm, RR 16, breath sounds clear but diminished.

## 2023-09-28 NOTE — Progress Notes (Signed)
 Palliative:  ***  Yong Channel, NP Palliative Medicine Team Pager [phone] (Please see [email or web address] for schedule) Team Phone [phone]

## 2023-09-28 NOTE — Progress Notes (Signed)
 PROGRESS NOTE     Joseph Atkins, is a 63 y.o. male, DOB - 17-Apr-1961, UJW:[id number]  Admit date - 09/25/2023   Admitting Physician Courage Quintella Buck, MD  Outpatient Primary MD for the patient is [name], NP  LOS - 3  Chief Complaint  Patient presents with   Shortness of Breath       Subjective: Joseph Atkins -was seen and examined this morning, stable acute distress, earlier this morning O2 demand down to 5 L was satting with exertion this morningO2 demand increased, with progressive shortness of breath,  Brief Narrative:  Joseph Atkins  os 63 y.o. male with medical history significant for GERD, HTN, hypothyroidism, HLD, alcohol and tobacco abuse, stage IV lung cancer with brain mets, recent diagnosis of community-acquired pneumonia on 09/22/2023, treated on Levaquin  (Levaquin  day # 4 at the time of admission) as outpatient but clinically radiologically appears to have worsened. - Admitted 09/25/2023 with acute hypoxic respiratory failure secondary to worsening CAP.     -Assessment and Plan:  CAP--with acute respiratory failure -Continue to be in acute respiratory failure, but O2 demand improved from 12 L to 5 L of oxygen  this morning 94%  recent diagnosis of community-acquired pneumonia on 09/22/2023, treated on Levaquin  (Levaquin  day # 4) as outpatient but clinically radiologically appears to have worsened. -WBC 8.6, 3.3 - Lactic acid 1.6 >> 1.8 - RSV, COVID and flu are negative -Sepsis was ruled out - Please see chest x-ray from 09/22/2023 and CTA chest from 09/25/2023   -c/n IV Rocephin/azithromycin  along with bronchodilators and mucolytics pending culture data 09/27/23 -Repeat chest x-ray on 09/27/2023-  Slight increase in moderate right pleural effusion.  -09/27/2023 s/p - thoracentesis yielding 700 mL fluid - Continue IV  antibiotics and steroids   Acute hypoxic respiratory failure--- POA:  hypoxic with O2 sats of 72% on room air required up to 15 L of oxygen   initially -Not previously on oxygen  -Will most likely need oxygen  at discharge -Reportedly improved from 12 L to 5 L of oxygen , currently satting 94%   Stage IV (T3, N0, M1C) non-small cell lung cancer, adenocarcinoma  -with mets to the brain -outpatient follow-up Dr. Randall Bush advised -- Hold Keytruda  due to acute infection  Social/Ethics--- palliative care consult appreciated - Patient remains full code   Chronic anemia of malignancy and chemotherapy- -baseline hemoglobin 10, dropped to 8.1, no signs of bleeding-monitoring Hgb currently 10.0 which is higher than recent baseline - Continue folic acid  - Monitor Hgb closely and transfuse as indicated   Hypothyroidism--continue levothyroxine    GERD--continue Protonix    Tobacco Abuse/acute COPD exacerbation --- due to #1 above - continue bronchodilators - Solu-Medrol  as ordered   Depression/Anxiety--stable,  --continue Cymbalta  May use hydroxyzine as needed  Status is: Inpatient   Disposition: The patient is from: Home              Anticipated d/c is to: Home              Anticipated d/c date is: 2 days              Patient currently is not medically stable to d/c. Barriers: Not Clinically Stable-   Code Status :  -  Code Status: Limited: Do not attempt resuscitation (DNR) -DNR-LIMITED -Do Not Intubate/DNI    Family Communication:   (patient is alert, awake and coherent)  Discussed with wife at bedside  DVT Prophylaxis  :   - SCDs  heparin  injection 5,000 Units Start: 09/25/23 2200  SCDs Start: 09/25/23 1838 Place TED hose Start: 09/25/23 1838   Lab Results  Component Value Date   PLT 152 09/27/2023    Inpatient Medications  Scheduled Meds:  [START ON 10/02/2023] amLODipine   5 mg Oral Daily   aspirin  EC  81 mg Oral Q breakfast   azithromycin   500 mg Oral Daily   budesonide (PULMICORT) nebulizer solution  0.25 mg Nebulization BID   Chlorhexidine  Gluconate Cloth  6 each Topical Daily    dextromethorphan-guaiFENesin   1 tablet Oral BID   DULoxetine   20 mg Oral BID   feeding supplement  237 mL Oral BID BM   fluticasone  furoate-vilanterol  1 puff Inhalation Daily   folic acid   1 mg Oral Daily   furosemide  20 mg Intravenous Q12H   heparin   5,000 Units Subcutaneous Q8H   hydrOXYzine  25 mg Oral QHS   ipratropium  0.5 mg Nebulization Q6H   levalbuterol  0.63 mg Nebulization Q6H   levothyroxine   100 mcg Oral Daily   lumateperone tosylate  42 mg Oral QHS   pantoprazole   40 mg Oral Daily   polyethylene glycol powder  17 g Oral Daily   predniSONE   50 mg Oral Q breakfast   senna-docusate  2 tablet Oral QHS   sodium chloride  flush  3 mL Intravenous Q12H   Continuous Infusions:  cefTRIAXone (ROCEPHIN)  IV 2 g (09/28/23 0953)   PRN Meds:.acetaminophen  **OR** acetaminophen , albuterol , bisacodyl, HYDROcodone  bit-homatropine, ondansetron  **OR** ondansetron  (ZOFRAN ) IV, polyethylene glycol, temazepam    Anti-infectives (From admission, onward)    Start     Dose/Rate Route Frequency Ordered Stop   09/28/23 1000  azithromycin  (ZITHROMAX ) tablet 500 mg        500 mg Oral Daily 09/28/23 0733     09/26/23 1000  cefTRIAXone (ROCEPHIN) 2 g in sodium chloride  0.9 % 100 mL IVPB        2 g 200 mL/hr over 30 Minutes Intravenous Every 24 hours 09/25/23 1142     09/26/23 1000  azithromycin  (ZITHROMAX ) 500 mg in sodium chloride  0.9 % 250 mL IVPB  Status:  Discontinued        500 mg 250 mL/hr over 60 Minutes Intravenous Every 24 hours 09/25/23 1142 09/28/23 0733   09/25/23 0945  cefTRIAXone (ROCEPHIN) 1 g in sodium chloride  0.9 % 100 mL IVPB        1 g 200 mL/hr over 30 Minutes Intravenous  Once 09/25/23 0943 09/25/23 1117   09/25/23 0945  azithromycin  (ZITHROMAX ) 500 mg in sodium chloride  0.9 % 250 mL IVPB        500 mg 250 mL/hr over 60 Minutes Intravenous  Once 09/25/23 0943 09/25/23 1251            Objective: Vitals:   09/28/23 0600 09/28/23 0700 09/28/23 0725 09/28/23 0810   BP: 106/64 106/71    Pulse: 100 100    Resp: (!) 21 (!) 22    Temp:    (!) 97.3 F (36.3 C)  TempSrc:    Axillary  SpO2: 97% 97% 94%   Weight:      Height:        Intake/Output Summary (Last 24 hours) at 09/28/2023 1124 Last data filed at 09/28/2023 0500 Gross per 24 hour  Intake 1004.69 ml  Output --  Net 1004.69 ml   Filed Weights   09/25/23 0938 09/25/23 1244 09/27/23 0424  Weight: 75.8 kg 72 kg 72 kg         General:  AAO x 3,  cooperative, no distress;   HEENT:  Normocephalic, PERRL, otherwise with in Normal limits   Neuro:  CNII-XII intact. , normal motor and sensation, reflexes intact   Lungs:   Clear to auscultation BL, Respirations unlabored,  No wheezes / crackles  Cardio:    S1/S2, RRR, No murmure, No Rubs or Gallops   Abdomen:  Soft, non-tender, bowel sounds active all four quadrants, no guarding or peritoneal signs.  Muscular  skeletal:  Limited exam -global generalized weaknesses - in bed, able to move all 4 extremities,   2+ pulses,  symmetric, No pitting edema  Skin:  Dry, warm to touch, negative for any Rashes,  Wounds: Please see nursing documentation           eralized weakness no new focal deficits, no tremors  Data Reviewed: I have personally reviewed following labs and imaging studies  CBC: Recent Labs  Lab 09/21/23 1446 09/25/23 0956 09/27/23 0456  WBC 4.4 8.6 3.3*  NEUTROABS 3.1 7.2  --   HGB 7.8* 10.0* 8.1*  HCT 23.2* 31.1* 25.2*  MCV 90.3 94.5 96.2  PLT 108* 188 152   Basic Metabolic Panel: Recent Labs  Lab 09/21/23 1446 09/25/23 0956 09/26/23 0444 09/27/23 0354  NA 126* 126* 127* 131*  K 3.5 3.8 3.4* 4.7  CL 92* 92* 96* 101  CO2 25 18* 23 23  GLUCOSE 105* 118* 137* 123*  BUN 9 8 8 10   CREATININE 0.95 0.94 0.73 0.64  CALCIUM  8.4* 8.2* 7.8* 8.0*  PHOS  --   --   --  3.9   GFR: Estimated Creatinine Clearance: 93.8 mL/min (by C-G formula based on SCr of 0.64 mg/dL). Liver Function Tests: Recent Labs  Lab  09/21/23 1446 09/25/23 0956 09/27/23 0354  AST 19 37  --   ALT 15 17  --   ALKPHOS 97 99  --   BILITOT 1.2 1.9*  --   PROT 6.7 6.6  --   ALBUMIN 3.3* 2.5* 2.1*    Recent Results (from the past 240 hours)  Blood Culture (routine x 2)     Status: None (Preliminary result)   Collection Time: 09/25/23  9:42 AM   Specimen: BLOOD  Result Value Ref Range Status   Specimen Description BLOOD RIGHT ARM  Final   Special Requests   Final    BOTTLES DRAWN AEROBIC AND ANAEROBIC Blood Culture adequate volume   Culture   Final    NO GROWTH 3 DAYS Performed at [facility], [address], [place], [place] [address]    Report Status PENDING  Incomplete  Blood Culture (routine x 2)     Status: None (Preliminary result)   Collection Time: 09/25/23  9:56 AM   Specimen: BLOOD  Result Value Ref Range Status   Specimen Description BLOOD RIGHT HAND  Final   Special Requests   Final    BOTTLES DRAWN AEROBIC AND ANAEROBIC Blood Culture results may not be optimal due to an inadequate volume of blood received in culture bottles   Culture   Final    NO GROWTH 3 DAYS Performed at [facility], [address], [place], [place] [address]    Report Status PENDING  Incomplete  Resp panel by RT-PCR (RSV, Flu A&B, Covid) Anterior Nasal Swab     Status: None   Collection Time: 09/25/23 11:00 AM   Specimen: Anterior Nasal Swab  Result Value Ref Range Status   SARS Coronavirus 2 by RT PCR NEGATIVE NEGATIVE Final  Comment: (NOTE) SARS-CoV-2 target nucleic acids are NOT DETECTED.  The SARS-CoV-2 RNA is generally detectable in upper respiratory specimens during the acute phase of infection. The lowest concentration of SARS-CoV-2 viral copies this assay can detect is 138 copies/mL. A negative result does not preclude SARS-Cov-2 infection and should not be used as the sole basis for treatment or other patient management decisions. A negative result may occur with  improper specimen collection/handling,  submission of specimen other than nasopharyngeal swab, presence of viral mutation(s) within the areas targeted by this assay, and inadequate number of viral copies(<138 copies/mL). A negative result must be combined with clinical observations, patient history, and epidemiological information. The expected result is Negative.  Fact Sheet for Patients:  BloggerCourse.com  Fact Sheet for Healthcare Providers:  SeriousBroker.it  This test is no t yet approved or cleared by the [place]  FDA and  has been authorized for detection and/or diagnosis of SARS-CoV-2 by FDA under an Emergency Use Authorization (EUA). This EUA will remain  in effect (meaning this test can be used) for the duration of the COVID-19 declaration under Section 564(b)(1) of the Act, 21 U.S.C.section 360bbb-3(b)(1), unless the authorization is terminated  or revoked sooner.       Influenza A by PCR NEGATIVE NEGATIVE Final   Influenza B by PCR NEGATIVE NEGATIVE Final    Comment: (NOTE) The Xpert Xpress SARS-CoV-2/FLU/RSV plus assay is intended as an aid in the diagnosis of influenza from Nasopharyngeal swab specimens and should not be used as a sole basis for treatment. Nasal washings and aspirates are unacceptable for Xpert Xpress SARS-CoV-2/FLU/RSV testing.  Fact Sheet for Patients: BloggerCourse.com  Fact Sheet for Healthcare Providers: SeriousBroker.it  This test is not yet approved or cleared by the [place]  FDA and has been authorized for detection and/or diagnosis of SARS-CoV-2 by FDA under an Emergency Use Authorization (EUA). This EUA will remain in effect (meaning this test can be used) for the duration of the COVID-19 declaration under Section 564(b)(1) of the Act, 21 U.S.C. section 360bbb-3(b)(1), unless the authorization is terminated or revoked.     Resp Syncytial Virus by PCR NEGATIVE  NEGATIVE Final    Comment: (NOTE) Fact Sheet for Patients: BloggerCourse.com  Fact Sheet for Healthcare Providers: SeriousBroker.it  This test is not yet approved or cleared by the [place]  FDA and has been authorized for detection and/or diagnosis of SARS-CoV-2 by FDA under an Emergency Use Authorization (EUA). This EUA will remain in effect (meaning this test can be used) for the duration of the COVID-19 declaration under Section 564(b)(1) of the Act, 21 U.S.C. section 360bbb-3(b)(1), unless the authorization is terminated or revoked.  Performed at [facility], [address], [place], [place] [address]   MRSA Next Gen by PCR, Nasal     Status: None   Collection Time: 09/25/23 12:42 PM   Specimen: Nasal Mucosa; Nasal Swab  Result Value Ref Range Status   MRSA by PCR Next Gen NOT DETECTED NOT DETECTED Final    Comment: (NOTE) The GeneXpert MRSA Assay (FDA approved for NASAL specimens only), is one component of a comprehensive MRSA colonization surveillance program. It is not intended to diagnose MRSA infection nor to guide or monitor treatment for MRSA infections. Test performance is not FDA approved in patients less than 4 years old. Performed at [facility], [address] [place], [place] [address]   Gram stain     Status: None   Collection Time: 09/27/23  1:45 PM   Specimen: Pleura  Result Value Ref Range Status   Specimen Description PLEURAL  Final   Special Requests PLEURAL  Final   Gram Stain   Final    NO ORGANISMS SEEN WBC PRESENT, PREDOMINANTLY MONONUCLEAR CYTOSPIN SMEAR Performed at [facility], [address] [place], [place] [address]    Report Status 09/27/2023 FINAL  Final  Culture, body fluid w Gram Stain-bottle     Status: None (Preliminary result)   Collection Time: 09/27/23  1:45 PM   Specimen: Pleura  Result Value Ref Range Status   Specimen Description PLEURAL  Final   Special Requests    Final    BOTTLES DRAWN AEROBIC AND ANAEROBIC Blood Culture adequate volume   Culture   Final    NO GROWTH < 24 HOURS Performed at [facility], [address], [place], [place] [address]    Report Status PENDING  Incomplete    Radiology Studies: DG Chest Port 1 View Result Date: 09/27/2023 CLINICAL DATA:  Post thoracentesis EXAM: PORTABLE CHEST 1 VIEW COMPARISON:  09/27/2023 FINDINGS: Decreasing right pleural effusion following thoracentesis. Probable pleural air laterally at the right lung base in the area of prior effusion suggesting stuck lung. Small residual right pleural effusion. Right Port-A-Cath is unchanged. Bilateral airspace opacities are unchanged. IMPRESSION: Decreasing right effusion following thoracentesis. Probable pleural air/pneumothorax laterally at the right lung base in the area of prior effusions suggesting stuck lung. Stable bilateral airspace disease. Electronically Signed   By: Janeece Mechanic M.D.   On: 09/27/2023 14:16   US  THORACENTESIS ASP PLEURAL SPACE W/IMG GUIDE Result Date: 09/27/2023 INDICATION: 63 year old male. History of metastatic lung cancer. Request is for diagnostic and therapeutic thoracentesis EXAM: ULTRASOUND GUIDED RIGHT-SIDED THERAPEUTIC AND DIAGNOSTIC THORACENTESIS MEDICATIONS: Lidocaine  1% 10 mL COMPLICATIONS: None immediate. PROCEDURE: An ultrasound guided thoracentesis was thoroughly discussed with the patient and questions answered. The benefits, risks, alternatives and complications were also discussed. The patient understands and wishes to proceed with the procedure. Written consent was obtained. Ultrasound was performed to localize and mark an adequate pocket of fluid in the right chest. The area was then prepped and draped in the normal sterile fashion. 1% Lidocaine  was used for local anesthesia. Under ultrasound guidance a 8 Fr Safe-T-Centesis catheter was introduced. Thoracentesis was performed. The catheter was removed and a dressing applied. FINDINGS:  A total of approximately 700 mL of straw-colored fluid was removed. Samples were sent to the laboratory as requested by the clinical team. IMPRESSION: Successful ultrasound guided therapeutic and diagnostic right-sided thoracentesis yielding 700 mL of pleural fluid. Performed by Reagan Camera NP Electronically Signed   By: Creasie Doctor M.D.   On: 09/27/2023 14:05   DG CHEST PORT 1 VIEW Result Date: 09/27/2023 CLINICAL DATA:  [id number] Dyspnea and respiratory abnormalities [id number] EXAM: PORTABLE CHEST - 1 VIEW COMPARISON:  09/25/2023 FINDINGS: Patchy alveolar opacities involving bases more than apices, generally stable since previous. Moderate right pleural effusion appears slightly increased. Stable right IJ port catheter. Heart size normal. Visualized bones unremarkable. IMPRESSION: 1. Slight increase in moderate right pleural effusion. 2. Stable patchy alveolar opacities. Electronically Signed   By: Nicoletta Barrier M.D.   On: 09/27/2023 08:00     Scheduled Meds:  [START ON 10/02/2023] amLODipine   5 mg Oral Daily   aspirin  EC  81 mg Oral Q breakfast   azithromycin   500 mg Oral Daily   budesonide (PULMICORT) nebulizer solution  0.25 mg Nebulization BID   Chlorhexidine  Gluconate Cloth  6 each Topical Daily   dextromethorphan-guaiFENesin   1  tablet Oral BID   DULoxetine   20 mg Oral BID   feeding supplement  237 mL Oral BID BM   fluticasone  furoate-vilanterol  1 puff Inhalation Daily   folic acid   1 mg Oral Daily   furosemide  20 mg Intravenous Q12H   heparin   5,000 Units Subcutaneous Q8H   hydrOXYzine  25 mg Oral QHS   ipratropium  0.5 mg Nebulization Q6H   levalbuterol  0.63 mg Nebulization Q6H   levothyroxine   100 mcg Oral Daily   lumateperone tosylate  42 mg Oral QHS   pantoprazole   40 mg Oral Daily   polyethylene glycol powder  17 g Oral Daily   predniSONE   50 mg Oral Q breakfast   senna-docusate  2 tablet Oral QHS   sodium chloride  flush  3 mL Intravenous Q12H   Continuous Infusions:   cefTRIAXone (ROCEPHIN)  IV 2 g (09/28/23 0953)     LOS: 3 days    Bobbetta Burnet M.D on 09/28/2023 at 11:24 AM  Go to [email or web address] - for contact info  [facility]  Critical care time spent 55 minutes-seeing evaluate patient, reviewing all medical records, labs, meds, drawn plan of care   - Office  [phone] If 7PM-7AM, please contact night-coverage [email or web address] 09/28/2023, 11:24 AM

## 2023-09-28 NOTE — Plan of Care (Signed)

## 2023-09-28 NOTE — Progress Notes (Signed)
 Patient had just went to bathroom and was on 4.5L O2 with a sat of mid to upper 80s.  Raised flow to 5L and gave patient some time and he coughed up white, frothy secretions and sat went up to 94%.  Patient currently getting breathing treatment.

## 2023-09-28 NOTE — Progress Notes (Signed)
   09/28/23 1417  Assess: MEWS Score  Temp 97.6 F (36.4 C)  BP 112/72  MAP (mmHg) 85  Pulse Rate (!) 111  Resp (!) 26  Level of Consciousness Alert  SpO2 91 %  O2 Flow Rate (L/min) 7 L/min  Assess: MEWS Score  MEWS Temp 0  MEWS Systolic 0  MEWS Pulse 2  MEWS RR 2  MEWS LOC 0  MEWS Score 4  MEWS Score Color Red  Assess: SIRS CRITERIA  SIRS Temperature  0  SIRS Respirations  1  SIRS Pulse 1  SIRS WBC 0  SIRS Score Sum  2   Patient has been previous RED/YELLOW and physician team aware. Charge nurse also updated.

## 2023-09-29 ENCOUNTER — Inpatient Hospital Stay (HOSPITAL_COMMUNITY)

## 2023-09-29 DIAGNOSIS — J189 Pneumonia, unspecified organism: Secondary | ICD-10-CM | POA: Diagnosis not present

## 2023-09-29 LAB — CBC
HCT: 28.2 % — ABNORMAL LOW (ref 39.0–52.0)
Hemoglobin: 9 g/dL — ABNORMAL LOW (ref 13.0–17.0)
MCH: 30.6 pg (ref 26.0–34.0)
MCHC: 31.9 g/dL (ref 30.0–36.0)
MCV: 95.9 fL (ref 80.0–100.0)
Platelets: 229 10*3/uL (ref 150–400)
RBC: 2.94 MIL/uL — ABNORMAL LOW (ref 4.22–5.81)
RDW: 17.2 % — ABNORMAL HIGH (ref 11.5–15.5)
WBC: 7.7 10*3/uL (ref 4.0–10.5)
nRBC: 0.9 % — ABNORMAL HIGH (ref 0.0–0.2)

## 2023-09-29 LAB — BRAIN NATRIURETIC PEPTIDE: B Natriuretic Peptide: 112 pg/mL — ABNORMAL HIGH (ref 0.0–100.0)

## 2023-09-29 LAB — COMPREHENSIVE METABOLIC PANEL WITH GFR
ALT: 35 U/L (ref 0–44)
AST: 38 U/L (ref 15–41)
Albumin: 2.5 g/dL — ABNORMAL LOW (ref 3.5–5.0)
Alkaline Phosphatase: 84 U/L (ref 38–126)
Anion gap: 16 — ABNORMAL HIGH (ref 5–15)
BUN: 9 mg/dL (ref 8–23)
CO2: 22 mmol/L (ref 22–32)
Calcium: 8.1 mg/dL — ABNORMAL LOW (ref 8.9–10.3)
Chloride: 94 mmol/L — ABNORMAL LOW (ref 98–111)
Creatinine, Ser: 0.77 mg/dL (ref 0.61–1.24)
GFR, Estimated: >60 mL/min (ref >60)
Glucose, Bld: 89 mg/dL (ref 70–99)
Potassium: 3.5 mmol/L (ref 3.5–5.1)
Sodium: 132 mmol/L — ABNORMAL LOW (ref 135–145)
Total Bilirubin: 0.9 mg/dL (ref 0.0–1.2)
Total Protein: 6 g/dL — ABNORMAL LOW (ref 6.5–8.1)

## 2023-09-29 LAB — MAGNESIUM: Magnesium: 1.5 mg/dL — ABNORMAL LOW (ref 1.7–2.4)

## 2023-09-29 MED ORDER — LEVALBUTEROL HCL 0.63 MG/3ML IN NEBU
0.6300 mg | INHALATION_SOLUTION | Freq: Three times a day (TID) | RESPIRATORY_TRACT | Status: DC
  Administered 2023-09-30 – 2023-10-01 (×4): 0.63 mg via RESPIRATORY_TRACT
  Filled 2023-09-29 (×5): qty 3

## 2023-09-29 MED ORDER — ALBUMIN HUMAN 25 % IV SOLN
12.5000 g | Freq: Once | INTRAVENOUS | Status: AC
  Administered 2023-09-29: 12.5 g via INTRAVENOUS
  Filled 2023-09-29: qty 50

## 2023-09-29 MED ORDER — FUROSEMIDE 10 MG/ML IJ SOLN
20.0000 mg | Freq: Three times a day (TID) | INTRAMUSCULAR | Status: DC
  Administered 2023-09-29: 20 mg via INTRAVENOUS
  Filled 2023-09-29 (×2): qty 2

## 2023-09-29 MED ORDER — ALBUMIN HUMAN 25 % IV SOLN: 25.0000 g | Freq: Once | INTRAVENOUS | Status: DC

## 2023-09-29 MED ORDER — IPRATROPIUM BROMIDE 0.02 % IN SOLN
0.5000 mg | Freq: Three times a day (TID) | RESPIRATORY_TRACT | Status: DC
  Administered 2023-09-30 – 2023-10-01 (×4): 0.5 mg via RESPIRATORY_TRACT
  Filled 2023-09-29 (×5): qty 2.5

## 2023-09-29 MED ORDER — MAGNESIUM SULFATE 2 GM/50ML IV SOLN
2.0000 g | Freq: Once | INTRAVENOUS | Status: AC
  Administered 2023-09-29: 2 g via INTRAVENOUS
  Filled 2023-09-29: qty 50

## 2023-09-29 MED ORDER — POTASSIUM CHLORIDE CRYS ER 20 MEQ PO TBCR
20.0000 meq | EXTENDED_RELEASE_TABLET | Freq: Once | ORAL | Status: AC
  Administered 2023-09-29: 20 meq via ORAL
  Filled 2023-09-29: qty 1

## 2023-09-29 MED ORDER — METOPROLOL TARTRATE 25 MG PO TABS
12.5000 mg | ORAL_TABLET | Freq: Two times a day (BID) | ORAL | Status: DC
  Administered 2023-09-29 – 2023-09-30 (×3): 12.5 mg via ORAL
  Filled 2023-09-29 (×5): qty 1

## 2023-09-29 NOTE — Evaluation (Signed)
 Physical Therapy Evaluation Patient Details Name: Joseph Atkins MRN: [id number] DOB: [birth year] Today's Date: 09/29/2023  History of Present Illness  Per MD:  "Joseph Atkins is a 63 y.o. male with medical history significant for GERD, HTN, hypothyroidism, HLD, alcohol and tobacco abuse, stage IV lung cancer with brain mets, recent diagnosis of community-acquired pneumonia on 09/22/2023, treated on Levaquin  (Levaquin  day # 4) as outpatient but clinically radiologically appears to have worsened.  -  In ED patient was found to be hypoxic with O2 sats of 72% on room air required up to 15 L of oxygen  initially after interventions patient has been weaned down to 6 L of oxygen  via nasal cannula.  No fever  Or chills"  Clinical Impression  Pt hesitant but agreeable to therapy.  Pt has increased SOB with minimal exertion at this time.   PT tends to mouth breathe, therapist spent time explaining why it is important to breathe thru his nose vs. Mouth.  Noted weakness in LE with decreased activity tolerance.  PT will benefit from skilled PT to improve his activity tolerance.       If plan is discharge home, recommend the following: A little help with walking and/or transfers;A little help with bathing/dressing/bathroom;Assistance with cooking/housework;Assist for transportation;Help with stairs or ramp for entrance   Can travel by private vehicle    yes    Equipment Recommendations None recommended by PT  Recommendations for Other Services   none    Functional Status Assessment Patient has had a recent decline in their functional status and demonstrates the ability to make significant improvements in function in a reasonable and predictable amount of time.     Precautions / Restrictions Precautions Precautions: None Restrictions Weight Bearing Restrictions Per Provider Order: No      Mobility  Bed Mobility Overal bed mobility: Modified Independent                   Transfers Overall transfer level: Modified independent Equipment used: Rolling walker (2 wheels)                    Ambulation/Gait Ambulation/Gait assistance: Modified independent (Device/Increase time) Gait Distance (Feet): 20 Feet Assistive device: Rolling walker (2 wheels) Gait Pattern/deviations: Decreased step length - right, Decreased step length - left Gait velocity: slow Gait velocity interpretation: <1.31 ft/sec, indicative of household ambulator Pre-gait activities: see exercises General Gait Details: increased SOB needs to be reminded not to mouth breathe       Pertinent Vitals/Pain Pain Assessment Pain Assessment: No/denies pain    Home Living Family/patient expects to be discharged to:: Private residence Living Arrangements: Spouse/significant other Available Help at Discharge: Family Type of Home: House Home Access: Stairs to enter Entrance Stairs-Rails: Right Entrance Stairs-Number of Steps: 4   Home Layout: One level Home Equipment: Agricultural consultant (2 wheels);Cane - single point      Prior Function Prior Level of Function : Needs assist             Mobility Comments: needs assistive device ADLs Comments: wife completes cooking/cleaning/shopping tasks     Extremity/Trunk Assessment        Lower Extremity Assessment Lower Extremity Assessment: Generalized weakness       Communication   Communication Communication: No apparent difficulties    Cognition       PT - Cognitive impairments: No apparent impairments  Following commands: Intact       Cueing Cueing Techniques: Verbal cues     General Comments      Exercises General Exercises - Lower Extremity Ankle Circles/Pumps: Both, 10 reps Quad Sets: Both, 10 reps Gluteal Sets: Both, 10 reps Long Arc Quad: Both, 10 reps Heel Slides: Both, 10 reps   Assessment/Plan    PT Assessment Patient needs continued PT services  PT Problem  List Decreased strength;Decreased balance;Decreased activity tolerance       PT Treatment Interventions Gait training;Therapeutic activities;Therapeutic exercise;Stair training    PT Goals (Current goals can be found in the Care Plan section)       Frequency Min 2X/week        AM-PAC PT "6 Clicks" Mobility  Outcome Measure Help needed turning from your back to your side while in a flat bed without using bedrails?: None Help needed moving from lying on your back to sitting on the side of a flat bed without using bedrails?: None Help needed moving to and from a bed to a chair (including a wheelchair)?: A Little Help needed standing up from a chair using your arms (e.g., wheelchair or bedside chair)?: A Little Help needed to walk in hospital room?: A Little Help needed climbing 3-5 steps with a railing? : A Lot 6 Click Score: 19    End of Session Equipment Utilized During Treatment: Oxygen  Activity Tolerance: Other (comment) (limited by SOB) Patient left: in bed;with call bell/phone within reach;Other (comment) (respiratory therapist starting treatment) Nurse Communication: Mobility status PT Visit Diagnosis: Unsteadiness on feet (R26.81);Muscle weakness (generalized) (M62.81)    Time: 1610-9604 PT Time Calculation (min) (ACUTE ONLY): 23 min   Charges:   PT Evaluation $PT Eval Low Complexity: 1 Low PT Treatments $Therapeutic Exercise: 8-22 mins PT General Charges $$ ACUTE PT VISIT: 1 Visit          Leodis Rainwater, PT CLT [phone]  09/29/2023, 8:47 AM

## 2023-09-29 NOTE — TOC Progression Note (Signed)
 Transition of Care [facility]) - Progression Note    Patient Details  Name: Joseph Atkins MRN: [id number] Date of Birth: 04/16/1961  Transition of Care [facility]) CM/SW Contact  [place] Lax, [place] Phone Number: 09/29/2023, 12:09 PM  Clinical Narrative:    CSW updated that PT is recommending [facility] PT for pt once medically stable. CSW met with pt at bedside to review, he is agreeable and does not have an agency preference. CSW reaching out to local agencies to see if there are any in network with pts insurance. TOC to follow.   Expected Discharge Plan: Home/Self Care Barriers to Discharge: Continued Medical Work up  Expected Discharge Plan and Services       Living arrangements for the past 2 months: Single Family Home                                       Social Determinants of Health (SDOH) Interventions SDOH Screenings   Food Insecurity: No Food Insecurity (09/25/2023)  Housing: Low Risk  (09/25/2023)  Transportation Needs: No Transportation Needs (09/25/2023)  Utilities: Not At Risk (09/25/2023)  Alcohol Screen: Low Risk  (10/31/2019)  Financial Resource Strain: Medium Risk (10/31/2019)  Physical Activity: Insufficiently Active (10/31/2019)  Social Connections: Moderately Isolated (10/31/2019)  Stress: Stress Concern Present (10/31/2019)  Tobacco Use: Medium Risk (09/25/2023)    Readmission Risk Interventions    09/26/2023   11:59 AM  Readmission Risk Prevention Plan  Transportation Screening Complete  PCP or Specialist Appt within 3-5 Days Not Complete  HRI or Home Care Consult Complete  Social Work Consult for Recovery Care Planning/Counseling Complete  Palliative Care Screening Complete  Medication Review Oceanographer) Complete

## 2023-09-29 NOTE — Progress Notes (Addendum)
 Mobility came into patients room to walk pt and wanted to check his o2 before hand. His o2 was 72% on 5.5 L HFNC resting in bed and his HR is 130's sustaining. This nurse bumped up his o2 slowly to try to bring it back up and  currently have him on 13 L HFNC with o2 between 91-92% with his HR still in the 130's. with resp of 20 no distress noted at this time. Notified Dr. Eilene Grater. EKG obtained and put in patients paper chart. Vs 98.6 BP 99/58 map 71 HR 130 R 20. O2 93% on HFNC. Awaiting new orders.

## 2023-09-29 NOTE — Progress Notes (Signed)
 Pt weaned back down to 5 HFNC from 12 HFNC

## 2023-09-29 NOTE — Progress Notes (Signed)
   09/29/23 1131  Assess: MEWS Score  Temp 98.6 F (37 C)  BP (!) 99/58  MAP (mmHg) 71  Pulse Rate (!) 130  Resp 20  Level of Consciousness Alert  SpO2 93 %  O2 Device HFNC  Patient Activity (if Appropriate) In bed  O2 Flow Rate (L/min) 13 L/min  Assess: MEWS Score  MEWS Temp 0  MEWS Systolic 1  MEWS Pulse 3  MEWS RR 0  MEWS LOC 0  MEWS Score 4  MEWS Score Color Red  Assess: if the MEWS score is Yellow or Red  Were vital signs accurate and taken at a resting state? Yes  Does the patient meet 2 or more of the SIRS criteria? No  MEWS guidelines implemented  Yes, red  Treat  MEWS Interventions Considered administering scheduled or prn medications/treatments as ordered  Take Vital Signs  Increase Vital Sign Frequency  Red: Q1hr x2, continue Q4hrs until patient remains green for 12hrs  Escalate  MEWS: Escalate Red: Discuss with charge nurse and notify provider. Consider notifying RRT. If remains red for 2 hours consider need for higher level of care  Notify: Charge Nurse/RN  Name of Charge Nurse/RN Notified Scott Cutting, RN  Provider Notification  Provider Name/Title Dr. Eilene Grater  Date Provider Notified 09/29/23  Time Provider Notified 1139  Method of Notification Page  Notification Reason New onset of dysrhythmia  Provider response See new orders  Date of Provider Response 09/29/23  Time of Provider Response 1139 (awaiting orders)  Assess: SIRS CRITERIA  SIRS Temperature  0  SIRS Respirations  0  SIRS Pulse 1  SIRS WBC 0  SIRS Score Sum  1

## 2023-09-29 NOTE — Progress Notes (Signed)
 PROGRESS NOTE     Joseph Atkins, is a 63 y.o. male, DOB - 1961/02/19, ZOX:[id number]  Admit date - 09/25/2023   Admitting Physician Courage Quintella Buck, MD  Outpatient Primary MD for the patient is [name], NP  LOS - 4  Chief Complaint  Patient presents with   Shortness of Breath       Subjective: Joseph Atkins -was seen and examined this morning sitting up in chair in no acute distress Still requiring up to 7 L of oxygen  to maintain O2 sat of 93% at rest, with minimal exertion his O2 demand increases up to 13 L, becomes tachycardic w heart rate of 130 Remains soft at 99/58 hemoglobin stable at 9.0    Brief Narrative:  Joseph Atkins  os 63 y.o. male with medical history significant for GERD, HTN, hypothyroidism, HLD, alcohol and tobacco abuse, stage IV lung cancer with brain mets, recent diagnosis of community-acquired pneumonia on 09/22/2023, treated on Levaquin  (Levaquin  day # 4 at the time of admission) as outpatient but clinically radiologically appears to have worsened. - Admitted 09/25/2023 with acute hypoxic respiratory failure secondary to worsening CAP.     -Assessment and Plan:  CAP--with acute respiratory failure - Remains in respiratory distress still requiring up to 7 L to maintain O2 sat of 93% at rest, O2 demand increases with minimal exertion up to 13 L, becomes tachycardic heart rate of 130  -Blood pressure is soft at 99/58 - Will obtain another chest x-ray - Starting albumin, with small dose of Lasix diuresing - Tapering down from IV to p.o. steroids   recent diagnosis of community-acquired pneumonia on 09/22/2023, treated on Levaquin  (Levaquin  day # 4) as outpatient but clinically radiologically appears to have worsened. -WBC 8.6, 3.3 - Lactic acid 1.6 >> 1.8 - RSV, COVID and flu are negative -Sepsis was ruled out - Please see chest x-ray from 09/22/2023 and CTA chest from 09/25/2023   - S/p 5 days of IV Rocephin/azithromycin  along with  bronchodilators and mucolytics pending culture data 09/27/23 -Repeat chest x-ray on 09/27/2023-  Slight increase in moderate right pleural effusion.  -09/27/2023 s/p - thoracentesis yielding 700 mL fluid - Continue IV  antibiotics and steroids-tapering off and changing to p.o.   Acute hypoxic respiratory failure--- POA:  hypoxic with O2 sats of 72% on room air was placed on 15 l HFNC in ICU -Not previously on oxygen  -Will most likely need oxygen  at discharge - Continue to complain shortness of breath, up to 7 L at rest, 13 L, -Continue nebs, tapering down steroids and antibiotic   Stage IV (T3, N0, M1C) non-small cell lung cancer, adenocarcinoma  -with mets to the brain -outpatient follow-up Dr. Randall Bush advised -- Hold Keytruda  due to acute infection  Social/Ethics--- palliative care consult appreciated - Patient remains full code   Chronic anemia of malignancy and chemotherapy- -baseline hemoglobin 10, dropped to as low as 8.1, no signs of bleeding-monitoring Hgb currently 9.0 - Continue folic acid  - Monitor Hgb closely and transfuse as indicated   Hypothyroidism--continue levothyroxine    GERD--continue Protonix    Tobacco Abuse/acute COPD exacerbation --- due to #1 above - continue bronchodilators - Solu-Medrol  as ordered   Depression/Anxiety--stable,  --continue Cymbalta  May use hydroxyzine as needed  Status is: Inpatient   Disposition: The patient is from: Home              Anticipated d/c is to: Home              Anticipated  d/c date is: 2 days              Patient currently is not medically stable to d/c. Barriers: Not Clinically Stable-   Code Status :  -  Code Status: Limited: Do not attempt resuscitation (DNR) -DNR-LIMITED -Do Not Intubate/DNI    Family Communication:   (patient is alert, awake and coherent)  Discussed with wife at bedside  DVT Prophylaxis  :   - SCDs  heparin  injection 5,000 Units Start: 09/25/23 2200 SCDs Start: 09/25/23 1838 Place  TED hose Start: 09/25/23 1838   Lab Results  Component Value Date   PLT 229 09/29/2023    Inpatient Medications  Scheduled Meds:  aspirin  EC  81 mg Oral Q breakfast   budesonide (PULMICORT) nebulizer solution  0.25 mg Nebulization BID   Chlorhexidine  Gluconate Cloth  6 each Topical Daily   dextromethorphan-guaiFENesin   1 tablet Oral BID   DULoxetine   20 mg Oral BID   feeding supplement  237 mL Oral BID BM   fluticasone  furoate-vilanterol  1 puff Inhalation Daily   folic acid   1 mg Oral Daily   furosemide  20 mg Intravenous Q8H   heparin   5,000 Units Subcutaneous Q8H   hydrOXYzine  25 mg Oral QHS   ipratropium  0.5 mg Nebulization Q6H   levalbuterol  0.63 mg Nebulization Q6H   levofloxacin   750 mg Oral Daily   levothyroxine   100 mcg Oral Daily   lumateperone tosylate  42 mg Oral QHS   metoprolol tartrate  12.5 mg Oral BID   pantoprazole   40 mg Oral Daily   polyethylene glycol powder  17 g Oral Daily   potassium chloride   20 mEq Oral Once   predniSONE   50 mg Oral Q breakfast   senna-docusate  2 tablet Oral QHS   sodium chloride  flush  3 mL Intravenous Q12H   Continuous Infusions:  magnesium  sulfate bolus IVPB     PRN Meds:.acetaminophen  **OR** acetaminophen , albuterol , bisacodyl, HYDROcodone  bit-homatropine, ondansetron  **OR** ondansetron  (ZOFRAN ) IV, temazepam    Anti-infectives (From admission, onward)    Start     Dose/Rate Route Frequency Ordered Stop   09/29/23 1000  levofloxacin  (LEVAQUIN ) tablet 750 mg        750 mg Oral Daily 09/28/23 1133 10/03/23 0959   09/28/23 1000  azithromycin  (ZITHROMAX ) tablet 500 mg  Status:  Discontinued        500 mg Oral Daily 09/28/23 0733 09/28/23 1133   09/26/23 1000  cefTRIAXone (ROCEPHIN) 2 g in sodium chloride  0.9 % 100 mL IVPB  Status:  Discontinued        2 g 200 mL/hr over 30 Minutes Intravenous Every 24 hours 09/25/23 1142 09/28/23 1133   09/26/23 1000  azithromycin  (ZITHROMAX ) 500 mg in sodium chloride  0.9 % 250 mL IVPB   Status:  Discontinued        500 mg 250 mL/hr over 60 Minutes Intravenous Every 24 hours 09/25/23 1142 09/28/23 0733   09/25/23 0945  cefTRIAXone (ROCEPHIN) 1 g in sodium chloride  0.9 % 100 mL IVPB        1 g 200 mL/hr over 30 Minutes Intravenous  Once 09/25/23 0943 09/25/23 1117   09/25/23 0945  azithromycin  (ZITHROMAX ) 500 mg in sodium chloride  0.9 % 250 mL IVPB        500 mg 250 mL/hr over 60 Minutes Intravenous  Once 09/25/23 0943 09/25/23 1251            Objective: Vitals:   09/28/23 2030  09/28/23 2044 09/29/23 0431 09/29/23 1131  BP:  108/79 99/71 (!) 99/58  Pulse:  (!) 105 (!) 115 (!) 130  Resp:  17 17 20   Temp:  97.6 F (36.4 C) 97.9 F (36.6 C) 98.6 F (37 C)  TempSrc:  Oral Oral Oral  SpO2: 94% 99% 93% 93%  Weight:      Height:        Intake/Output Summary (Last 24 hours) at 09/29/2023 1226 Last data filed at 09/29/2023 1009 Gross per 24 hour  Intake 739.61 ml  Output --  Net 739.61 ml   Filed Weights   09/25/23 0938 09/25/23 1244 09/27/23 0424  Weight: 75.8 kg 72 kg 72 kg          General:  AAO x 3,  cooperative, no distress;   HEENT:  Normocephalic, PERRL, otherwise with in Normal limits   Neuro:  CNII-XII intact. , normal motor and sensation, reflexes intact   Lungs:   Clear to auscultation BL, Respirations unlabored,  No wheezes / crackles  Cardio:    S1/S2, RRR, No murmure, No Rubs or Gallops   Abdomen:  Soft, non-tender, bowel sounds active all four quadrants, no guarding or peritoneal signs.  Muscular  skeletal:  Limited exam -global generalized weaknesses - in bed, able to move all 4 extremities,   2+ pulses,  symmetric, No pitting edema  Skin:  Dry, warm to touch, negative for any Rashes,  Wounds: Please see nursing documentation         eralized weakness no new focal deficits, no tremors  Data Reviewed: I have personally reviewed following labs and imaging studies  CBC: Recent Labs  Lab 09/25/23 0956 09/27/23 0456  09/29/23 0806  WBC 8.6 3.3* 7.7  NEUTROABS 7.2  --   --   HGB 10.0* 8.1* 9.0*  HCT 31.1* 25.2* 28.2*  MCV 94.5 96.2 95.9  PLT 188 152 229   Basic Metabolic Panel: Recent Labs  Lab 09/25/23 0956 09/26/23 0444 09/27/23 0354 09/29/23 0806  NA 126* 127* 131* 132*  K 3.8 3.4* 4.7 3.5  CL 92* 96* 101 94*  CO2 18* 23 23 22   GLUCOSE 118* 137* 123* 89  BUN 8 8 10 9   CREATININE 0.94 0.73 0.64 0.77  CALCIUM  8.2* 7.8* 8.0* 8.1*  PHOS  --   --  3.9  --    GFR: Estimated Creatinine Clearance: 93.8 mL/min (by C-G formula based on SCr of 0.77 mg/dL). Liver Function Tests: Recent Labs  Lab 09/25/23 0956 09/27/23 0354 09/29/23 0806  AST 37  --  38  ALT 17  --  35  ALKPHOS 99  --  84  BILITOT 1.9*  --  0.9  PROT 6.6  --  6.0*  ALBUMIN 2.5* 2.1* 2.5*    Recent Results (from the past 240 hours)  Blood Culture (routine x 2)     Status: None (Preliminary result)   Collection Time: 09/25/23  9:42 AM   Specimen: BLOOD  Result Value Ref Range Status   Specimen Description BLOOD RIGHT ARM  Final   Special Requests   Final    BOTTLES DRAWN AEROBIC AND ANAEROBIC Blood Culture adequate volume   Culture   Final    NO GROWTH 4 DAYS Performed at [facility], [address], [place], [place] [address]    Report Status PENDING  Incomplete  Blood Culture (routine x 2)     Status: None (Preliminary result)   Collection Time: 09/25/23  9:56 AM  Specimen: BLOOD  Result Value Ref Range Status   Specimen Description BLOOD RIGHT HAND  Final   Special Requests   Final    BOTTLES DRAWN AEROBIC AND ANAEROBIC Blood Culture results may not be optimal due to an inadequate volume of blood received in culture bottles   Culture   Final    NO GROWTH 4 DAYS Performed at [facility], [address], [place], [place] [address]    Report Status PENDING  Incomplete  Resp panel by RT-PCR (RSV, Flu A&B, Covid) Anterior Nasal Swab     Status: None   Collection Time: 09/25/23 11:00 AM   Specimen: Anterior  Nasal Swab  Result Value Ref Range Status   SARS Coronavirus 2 by RT PCR NEGATIVE NEGATIVE Final    Comment: (NOTE) SARS-CoV-2 target nucleic acids are NOT DETECTED.  The SARS-CoV-2 RNA is generally detectable in upper respiratory specimens during the acute phase of infection. The lowest concentration of SARS-CoV-2 viral copies this assay can detect is 138 copies/mL. A negative result does not preclude SARS-Cov-2 infection and should not be used as the sole basis for treatment or other patient management decisions. A negative result may occur with  improper specimen collection/handling, submission of specimen other than nasopharyngeal swab, presence of viral mutation(s) within the areas targeted by this assay, and inadequate number of viral copies(<138 copies/mL). A negative result must be combined with clinical observations, patient history, and epidemiological information. The expected result is Negative.  Fact Sheet for Patients:  BloggerCourse.com  Fact Sheet for Healthcare Providers:  SeriousBroker.it  This test is no t yet approved or cleared by the [place]  FDA and  has been authorized for detection and/or diagnosis of SARS-CoV-2 by FDA under an Emergency Use Authorization (EUA). This EUA will remain  in effect (meaning this test can be used) for the duration of the COVID-19 declaration under Section 564(b)(1) of the Act, 21 U.S.C.section 360bbb-3(b)(1), unless the authorization is terminated  or revoked sooner.       Influenza A by PCR NEGATIVE NEGATIVE Final   Influenza B by PCR NEGATIVE NEGATIVE Final    Comment: (NOTE) The Xpert Xpress SARS-CoV-2/FLU/RSV plus assay is intended as an aid in the diagnosis of influenza from Nasopharyngeal swab specimens and should not be used as a sole basis for treatment. Nasal washings and aspirates are unacceptable for Xpert Xpress SARS-CoV-2/FLU/RSV testing.  Fact Sheet for  Patients: BloggerCourse.com  Fact Sheet for Healthcare Providers: SeriousBroker.it  This test is not yet approved or cleared by the [place]  FDA and has been authorized for detection and/or diagnosis of SARS-CoV-2 by FDA under an Emergency Use Authorization (EUA). This EUA will remain in effect (meaning this test can be used) for the duration of the COVID-19 declaration under Section 564(b)(1) of the Act, 21 U.S.C. section 360bbb-3(b)(1), unless the authorization is terminated or revoked.     Resp Syncytial Virus by PCR NEGATIVE NEGATIVE Final    Comment: (NOTE) Fact Sheet for Patients: BloggerCourse.com  Fact Sheet for Healthcare Providers: SeriousBroker.it  This test is not yet approved or cleared by the [place]  FDA and has been authorized for detection and/or diagnosis of SARS-CoV-2 by FDA under an Emergency Use Authorization (EUA). This EUA will remain in effect (meaning this test can be used) for the duration of the COVID-19 declaration under Section 564(b)(1) of the Act, 21 U.S.C. section 360bbb-3(b)(1), unless the authorization is terminated or revoked.  Performed at [facility], [address] [place], [place] [address]  MRSA Next Gen by PCR, Nasal     Status: None   Collection Time: 09/25/23 12:42 PM   Specimen: Nasal Mucosa; Nasal Swab  Result Value Ref Range Status   MRSA by PCR Next Gen NOT DETECTED NOT DETECTED Final    Comment: (NOTE) The GeneXpert MRSA Assay (FDA approved for NASAL specimens only), is one component of a comprehensive MRSA colonization surveillance program. It is not intended to diagnose MRSA infection nor to guide or monitor treatment for MRSA infections. Test performance is not FDA approved in patients less than 70 years old. Performed at [facility], [address] [place], [place] [address]   Gram stain     Status: None    Collection Time: 09/27/23  1:45 PM   Specimen: Pleura  Result Value Ref Range Status   Specimen Description PLEURAL  Final   Special Requests PLEURAL  Final   Gram Stain   Final    NO ORGANISMS SEEN WBC PRESENT, PREDOMINANTLY MONONUCLEAR CYTOSPIN SMEAR Performed at [facility], [address] [place], [place] [address]    Report Status 09/27/2023 FINAL  Final  Culture, body fluid w Gram Stain-bottle     Status: None (Preliminary result)   Collection Time: 09/27/23  1:45 PM   Specimen: Pleura  Result Value Ref Range Status   Specimen Description PLEURAL  Final   Special Requests   Final    BOTTLES DRAWN AEROBIC AND ANAEROBIC Blood Culture adequate volume   Culture   Final    NO GROWTH 2 DAYS Performed at [facility], [address] [place], [place] [address]    Report Status PENDING  Incomplete    Radiology Studies: DG CHEST PORT 1 VIEW Result Date: 09/29/2023 CLINICAL DATA:  Shortness of breath EXAM: PORTABLE CHEST 1 VIEW COMPARISON:  September 27, 2023 FINDINGS: No change right IJ Infusaport catheter. Persistent unchanged bilateral basilar pulmonary infiltrates with consolidative changes of the left lower lobe pleural reaction of the right lower lobe. Previously seen lucency within the right lateral costophrenic sulcus is not seen on today's images. No evidence of pneumothorax. IMPRESSION: Persistent unchanged bilateral basilar pulmonary infiltrates with consolidative changes of the left lower lobe pleural reaction of the right lower lobe. No significant change with persistent pulmonary infiltrates and right pleural effusion Electronically Signed   By: Fredrich Jefferson M.D.   On: 09/29/2023 08:22   DG Chest Port 1 View Result Date: 09/27/2023 CLINICAL DATA:  Post thoracentesis EXAM: PORTABLE CHEST 1 VIEW COMPARISON:  09/27/2023 FINDINGS: Decreasing right pleural effusion following thoracentesis. Probable pleural air laterally at the right lung base in the area of prior effusion suggesting stuck  lung. Small residual right pleural effusion. Right Port-A-Cath is unchanged. Bilateral airspace opacities are unchanged. IMPRESSION: Decreasing right effusion following thoracentesis. Probable pleural air/pneumothorax laterally at the right lung base in the area of prior effusions suggesting stuck lung. Stable bilateral airspace disease. Electronically Signed   By: Janeece Mechanic M.D.   On: 09/27/2023 14:16   US  THORACENTESIS ASP PLEURAL SPACE W/IMG GUIDE Result Date: 09/27/2023 INDICATION: 63 year old male. History of metastatic lung cancer. Request is for diagnostic and therapeutic thoracentesis EXAM: ULTRASOUND GUIDED RIGHT-SIDED THERAPEUTIC AND DIAGNOSTIC THORACENTESIS MEDICATIONS: Lidocaine  1% 10 mL COMPLICATIONS: None immediate. PROCEDURE: An ultrasound guided thoracentesis was thoroughly discussed with the patient and questions answered. The benefits, risks, alternatives and complications were also discussed. The patient understands and wishes to proceed with the procedure. Written consent was obtained. Ultrasound was performed to localize and mark an adequate  pocket of fluid in the right chest. The area was then prepped and draped in the normal sterile fashion. 1% Lidocaine  was used for local anesthesia. Under ultrasound guidance a 8 Fr Safe-T-Centesis catheter was introduced. Thoracentesis was performed. The catheter was removed and a dressing applied. FINDINGS: A total of approximately 700 mL of straw-colored fluid was removed. Samples were sent to the laboratory as requested by the clinical team. IMPRESSION: Successful ultrasound guided therapeutic and diagnostic right-sided thoracentesis yielding 700 mL of pleural fluid. Performed by Reagan Camera NP Electronically Signed   By: Creasie Doctor M.D.   On: 09/27/2023 14:05     Scheduled Meds:  aspirin  EC  81 mg Oral Q breakfast   budesonide (PULMICORT) nebulizer solution  0.25 mg Nebulization BID   Chlorhexidine  Gluconate Cloth  6 each Topical  Daily   dextromethorphan-guaiFENesin   1 tablet Oral BID   DULoxetine   20 mg Oral BID   feeding supplement  237 mL Oral BID BM   fluticasone  furoate-vilanterol  1 puff Inhalation Daily   folic acid   1 mg Oral Daily   furosemide  20 mg Intravenous Q8H   heparin   5,000 Units Subcutaneous Q8H   hydrOXYzine  25 mg Oral QHS   ipratropium  0.5 mg Nebulization Q6H   levalbuterol  0.63 mg Nebulization Q6H   levofloxacin   750 mg Oral Daily   levothyroxine   100 mcg Oral Daily   lumateperone tosylate  42 mg Oral QHS   metoprolol tartrate  12.5 mg Oral BID   pantoprazole   40 mg Oral Daily   polyethylene glycol powder  17 g Oral Daily   potassium chloride   20 mEq Oral Once   predniSONE   50 mg Oral Q breakfast   senna-docusate  2 tablet Oral QHS   sodium chloride  flush  3 mL Intravenous Q12H   Continuous Infusions:  magnesium  sulfate bolus IVPB       LOS: 4 days    Bobbetta Burnet M.D on 09/29/2023 at 12:26 PM  Go to [email or web address] - for contact info  [facility]  Time spent 55 minutes-seeing evaluate patient, reviewing all medical records, labs, meds, drawn plan of care   - Office  [phone] If 7PM-7AM, please contact night-coverage [email or web address] 09/29/2023, 12:26 PM

## 2023-09-29 NOTE — Plan of Care (Signed)
  Problem: Acute Rehab PT Goals(only PT should resolve) Goal: Patient Will Transfer Sit To/From Stand Flowsheets (Taken 09/29/2023 [phone]) Patient will transfer sit to/from stand: with modified independence Goal: Pt Will Ambulate Flowsheets (Taken 09/29/2023 0844) Pt will Ambulate:  75 feet  with modified independence  with least restrictive assistive device Goal: Pt Will Go Up/Down Stairs Flowsheets (Taken 09/29/2023 0844) Pt will Go Up / Down Stairs:  3-5 stairs  with minimal assist

## 2023-09-30 ENCOUNTER — Other Ambulatory Visit (HOSPITAL_COMMUNITY): Payer: Self-pay | Admitting: *Deleted

## 2023-09-30 ENCOUNTER — Inpatient Hospital Stay (HOSPITAL_COMMUNITY)

## 2023-09-30 DIAGNOSIS — R079 Chest pain, unspecified: Secondary | ICD-10-CM | POA: Diagnosis not present

## 2023-09-30 DIAGNOSIS — I5031 Acute diastolic (congestive) heart failure: Secondary | ICD-10-CM

## 2023-09-30 DIAGNOSIS — J189 Pneumonia, unspecified organism: Secondary | ICD-10-CM | POA: Diagnosis not present

## 2023-09-30 DIAGNOSIS — D649 Anemia, unspecified: Secondary | ICD-10-CM | POA: Diagnosis not present

## 2023-09-30 DIAGNOSIS — J9601 Acute respiratory failure with hypoxia: Secondary | ICD-10-CM | POA: Diagnosis not present

## 2023-09-30 LAB — COMPREHENSIVE METABOLIC PANEL WITH GFR
ALT: 39 U/L (ref 0–44)
AST: 38 U/L (ref 15–41)
Albumin: 2.6 g/dL — ABNORMAL LOW (ref 3.5–5.0)
Alkaline Phosphatase: 81 U/L (ref 38–126)
Anion gap: 10 (ref 5–15)
BUN: 12 mg/dL (ref 8–23)
CO2: 23 mmol/L (ref 22–32)
Calcium: 8.1 mg/dL — ABNORMAL LOW (ref 8.9–10.3)
Chloride: 96 mmol/L — ABNORMAL LOW (ref 98–111)
Creatinine, Ser: 0.8 mg/dL (ref 0.61–1.24)
GFR, Estimated: >60 mL/min (ref >60)
Glucose, Bld: 100 mg/dL — ABNORMAL HIGH (ref 70–99)
Potassium: 3.7 mmol/L (ref 3.5–5.1)
Sodium: 129 mmol/L — ABNORMAL LOW (ref 135–145)
Total Bilirubin: 0.9 mg/dL (ref 0.0–1.2)
Total Protein: 5.9 g/dL — ABNORMAL LOW (ref 6.5–8.1)

## 2023-09-30 LAB — CBC
HCT: 25.2 % — ABNORMAL LOW (ref 39.0–52.0)
Hemoglobin: 7.7 g/dL — ABNORMAL LOW (ref 13.0–17.0)
MCH: 29.2 pg (ref 26.0–34.0)
MCHC: 30.6 g/dL (ref 30.0–36.0)
MCV: 95.5 fL (ref 80.0–100.0)
Platelets: 187 10*3/uL (ref 150–400)
RBC: 2.64 MIL/uL — ABNORMAL LOW (ref 4.22–5.81)
RDW: 17.1 % — ABNORMAL HIGH (ref 11.5–15.5)
WBC: 5.7 10*3/uL (ref 4.0–10.5)
nRBC: 0.4 % — ABNORMAL HIGH (ref 0.0–0.2)

## 2023-09-30 LAB — ECHOCARDIOGRAM COMPLETE
AR max vel: 0.82 cm2
AV Area VTI: 0.94 cm2
AV Area mean vel: 0.77 cm2
AV Mean grad: 10 mmHg
AV Peak grad: 18.4 mmHg
Ao pk vel: 2.15 m/s
Area-P 1/2: 4.26 cm2
Height: 68 in
S' Lateral: 2.95 cm
Weight: 2539.7 [oz_av]

## 2023-09-30 LAB — CULTURE, BLOOD (ROUTINE X 2)
Culture: NO GROWTH
Culture: NO GROWTH
Special Requests: ADEQUATE

## 2023-09-30 LAB — VITAMIN B12: Vitamin B-12: 1160 pg/mL — ABNORMAL HIGH (ref 180–914)

## 2023-09-30 LAB — IRON AND TIBC
Iron: 54 ug/dL (ref 45–182)
Saturation Ratios: 29 % (ref 17.9–39.5)
TIBC: 187 ug/dL — ABNORMAL LOW (ref 250–450)
UIBC: 133 ug/dL

## 2023-09-30 LAB — FOLATE: Folate: 16.8 ng/mL (ref >5.9)

## 2023-09-30 LAB — HEMOGLOBIN AND HEMATOCRIT, BLOOD
HCT: 27.6 % — ABNORMAL LOW (ref 39.0–52.0)
Hemoglobin: 8.9 g/dL — ABNORMAL LOW (ref 13.0–17.0)

## 2023-09-30 LAB — BPAM RBC
Blood Product Expiration Date: 202507072359
Unit Type and Rh: 5100

## 2023-09-30 LAB — PREPARE RBC (CROSSMATCH)

## 2023-09-30 MED ORDER — PREDNISONE 20 MG PO TABS
40.0000 mg | ORAL_TABLET | Freq: Every day | ORAL | Status: DC
  Administered 2023-09-30: 40 mg via ORAL

## 2023-09-30 MED ORDER — SODIUM CHLORIDE 0.9% IV SOLUTION: Freq: Once | INTRAVENOUS | Status: AC

## 2023-09-30 MED ORDER — PANTOPRAZOLE SODIUM 40 MG PO TBEC
40.0000 mg | DELAYED_RELEASE_TABLET | Freq: Two times a day (BID) | ORAL | Status: DC
  Administered 2023-09-30 – 2023-10-01 (×3): 40 mg via ORAL
  Filled 2023-09-30 (×2): qty 1

## 2023-09-30 MED ORDER — PREDNISONE 20 MG PO TABS
30.0000 mg | ORAL_TABLET | Freq: Every day | ORAL | Status: DC
  Administered 2023-10-01: 30 mg via ORAL
  Filled 2023-09-30: qty 1

## 2023-09-30 MED ORDER — SENNOSIDES-DOCUSATE SODIUM 8.6-50 MG PO TABS
2.0000 | ORAL_TABLET | Freq: Two times a day (BID) | ORAL | Status: DC
  Filled 2023-09-30: qty 2

## 2023-09-30 NOTE — Progress Notes (Signed)
*  PRELIMINARY RESULTS* Echocardiogram 2D Echocardiogram has been performed.  Bernis Brisker 09/30/2023, 4:07 PM

## 2023-09-30 NOTE — Progress Notes (Signed)
 Weaned pt down to 3.5 L of o2

## 2023-09-30 NOTE — Progress Notes (Unsigned)
 Patient eating lunch at 12:27. Will check later.          Denese Finn, RCS

## 2023-09-30 NOTE — Plan of Care (Signed)

## 2023-09-30 NOTE — Plan of Care (Signed)

## 2023-09-30 NOTE — Progress Notes (Signed)
 Patient and family member were questioning what the patients oxygen  saturation should be at. I explained to them that 90-92% would be ideal. Patient does not wear any O2 at home. He is requiring 5lpm [place] to keep him maintained at 92-93%. He mentioned that the past couple of nights his liter flow has had to be increased due to decreased saturation. I checked the pulse ox on his finger and was getting a reading of 78-82% on 5lpm. I changed the cord out to get a reading using an ear probe and his oxygen  saturation reading was 89-93%. Please use his ear probe for oxygen  checks here on out.

## 2023-09-30 NOTE — Progress Notes (Signed)
 PROGRESS NOTE     Joseph Atkins, is a 63 y.o. male, DOB - [birth year], ZOX:[id number]  Admit date - 09/25/2023   Admitting Physician Courage Quintella Buck, MD  Outpatient Primary MD for the patient is [name], NP  LOS - 5  Chief Complaint  Patient presents with   Shortness of Breath       Subjective: Joseph Atkins -was seen and examined this morning, still complaining of shortness of breath generalized weakness Not O2 dependent baseline   The patient and his wife reported his hemoglobin drops post chemotherapy he usually gets transfused a week after  Brief Narrative:  Joseph Atkins  os 64 y.o. male with medical history significant for GERD, HTN, hypothyroidism, HLD, alcohol and tobacco abuse, stage IV lung cancer with brain mets, recent diagnosis of community-acquired pneumonia on 09/22/2023, treated on Levaquin  (Levaquin  day # 4 at the time of admission) as outpatient but clinically radiologically appears to have worsened. - Admitted 09/25/2023 with acute hypoxic respiratory failure secondary to worsening CAP.     -Assessment and Plan:  CAP--with acute respiratory failure with pleural effusion - Still experiencing shortness of breath, requiring up to 5 L of oxygen  to maintain O2 sat of 93% With exertion overnight his O2 saturation drops  - During this admission patient was up to 13 L of high flow nasal cannula in ICU, - He is not O2 dependent at baseline  -Blood pressure is soft at 99/58>> 106/76 this morning - Chest x-ray history reviewed, no progressive -S/p treatment with albumin  and Lasix    - Tapering down from IV to p.o. steroids   recent diagnosis of community-acquired pneumonia on 09/22/2023, treated on Levaquin  (Levaquin  day # 4) as outpatient  -WBC 8.6, 3.3, 5.7 - Lactic acid 1.6 >> 1.8 - RSV, COVID and flu are negative -Sepsis was ruled out - Please see chest x-ray from 09/22/2023 and CTA chest from 09/25/2023   - S/p 5 days of IV  Rocephin /azithromycin  along with bronchodilators and mucolytics  -Cultures remain with no growth -Repeat chest x-ray on 09/27/2023-  Slight increase in moderate right pleural effusion.   -09/27/2023 s/p - thoracentesis yielding 700 mL fluid - Continue IV  antibiotics and steroids-tapering off and changing to p.o.   Acute hypoxic respiratory failure--- with history of COPD, lung CA, In the setting of pneumonia and pleural effusion  POA:  hypoxic with O2 sats of 72% on room air was placed on 15 l HFNC in ICU -Not previously on oxygen  -Will most likely need oxygen  at discharge - Continue to complain shortness of breath, up to 7 L at rest, 13 L, -Continue nebs, tapering down steroids and antibiotic -Still experiencing shortness of breath and hypoxia especially overnight  His goal O2 sat would be 88-92% with minimal supplemental oxygen    Stage IV (T3, N0, M1C) non-small cell lung cancer, adenocarcinoma  -with mets to the brain -outpatient follow-up Dr. Randall Bush advised -- Hold Keytruda  due to acute infection   Social/Ethics--- palliative care consult appreciated - Patient remains full code   Chronic anemia of malignancy and chemotherapy- -baseline hemoglobin 10, dropped to as low as 8.1, no signs of bleeding-monitoring Hgb currently 9.0 - Continue folic acid  - Monitor Hgb closely and transfuse as indicated   Hypothyroidism--continue levothyroxine    GERD--continue Protonix    Tobacco Abuse/acute COPD exacerbation --- due to #1 above - continue bronchodilators - Solu-Medrol  as ordered   Depression/Anxiety--stable,  --continue Cymbalta  May use hydroxyzine  as needed  Anemia of chronic disease-in setting  of lung cancer - Per patient his wife postchemotherapy always his hemoglobin drops  Due to hypotension, hypoxia, generalized weaknesses-drop in his hemoglobin from baseline --we will transfuse 1 unit of PRBC today 09/30/2023       Status is: Inpatient   Disposition:  The patient is from: Home              Anticipated d/c is to: Home              Anticipated d/c date is: 2 days              Patient currently is not medically stable to d/c. Barriers: Not Clinically Stable-   Code Status :  -  Code Status: Limited: Do not attempt resuscitation (DNR) -DNR-LIMITED -Do Not Intubate/DNI    Family Communication:   (patient is alert, awake and coherent)  Discussed with wife at bedside  DVT Prophylaxis  :   - SCDs  Place and maintain sequential compression device Start: 09/30/23 0734 SCDs Start: 09/25/23 1838 Place TED hose Start: 09/25/23 1838   Lab Results  Component Value Date   PLT 187 09/30/2023    Inpatient Medications  Scheduled Meds:  sodium chloride    Intravenous Once   aspirin  EC  81 mg Oral Q breakfast   budesonide  (PULMICORT ) nebulizer solution  0.25 mg Nebulization BID   Chlorhexidine  Gluconate Cloth  6 each Topical Daily   dextromethorphan -guaiFENesin   1 tablet Oral BID   DULoxetine   20 mg Oral BID   feeding supplement  237 mL Oral BID BM   fluticasone  furoate-vilanterol  1 puff Inhalation Daily   folic acid   1 mg Oral Daily   hydrOXYzine   25 mg Oral QHS   ipratropium  0.5 mg Nebulization TID   levalbuterol   0.63 mg Nebulization TID   levofloxacin   750 mg Oral Daily   levothyroxine   100 mcg Oral Daily   lumateperone  tosylate  42 mg Oral QHS   metoprolol  tartrate  12.5 mg Oral BID   pantoprazole   40 mg Oral BID   polyethylene glycol powder  17 g Oral Daily   predniSONE   40 mg Oral Q breakfast   senna-docusate  2 tablet Oral BID   sodium chloride  flush  3 mL Intravenous Q12H   Continuous Infusions:   PRN Meds:.acetaminophen  **OR** acetaminophen , albuterol , HYDROcodone  bit-homatropine, ondansetron  **OR** ondansetron  (ZOFRAN ) IV, temazepam    Anti-infectives (From admission, onward)    Start     Dose/Rate Route Frequency Ordered Stop   09/29/23 1000  levofloxacin  (LEVAQUIN ) tablet 750 mg        750 mg Oral Daily 09/28/23  1133 10/03/23 0959   09/28/23 1000  azithromycin  (ZITHROMAX ) tablet 500 mg  Status:  Discontinued        500 mg Oral Daily 09/28/23 0733 09/28/23 1133   09/26/23 1000  cefTRIAXone  (ROCEPHIN ) 2 g in sodium chloride  0.9 % 100 mL IVPB  Status:  Discontinued        2 g 200 mL/hr over 30 Minutes Intravenous Every 24 hours 09/25/23 1142 09/28/23 1133   09/26/23 1000  azithromycin  (ZITHROMAX ) 500 mg in sodium chloride  0.9 % 250 mL IVPB  Status:  Discontinued        500 mg 250 mL/hr over 60 Minutes Intravenous Every 24 hours 09/25/23 1142 09/28/23 0733   09/25/23 0945  cefTRIAXone  (ROCEPHIN ) 1 g in sodium chloride  0.9 % 100 mL IVPB        1 g 200 mL/hr over 30 Minutes Intravenous  Once 09/25/23 0943 09/25/23 1117   09/25/23 0945  azithromycin  (ZITHROMAX ) 500 mg in sodium chloride  0.9 % 250 mL IVPB        500 mg 250 mL/hr over 60 Minutes Intravenous  Once 09/25/23 0943 09/25/23 1251            Objective: Vitals:   09/30/23 0503 09/30/23 0700 09/30/23 0823 09/30/23 0832  BP: 106/76     Pulse: (!) 107     Resp: 16     Temp: (!) 97.5 F (36.4 C)     TempSrc: Oral     SpO2: 94% 93% 93% 93%  Weight:      Height:        Intake/Output Summary (Last 24 hours) at 09/30/2023 1142 Last data filed at 09/30/2023 0500 Gross per 24 hour  Intake 530 ml  Output --  Net 530 ml   Filed Weights   09/25/23 0938 09/25/23 1244 09/27/23 0424  Weight: 75.8 kg 72 kg 72 kg         General:  AAO x 3,  cooperative, no distress;   HEENT:  Normocephalic, PERRL, otherwise with in Normal limits   Neuro:  CNII-XII intact. , normal motor and sensation, reflexes intact   Lungs:   Clear to auscultation BL, Respirations unlabored,  No wheezes / crackles  Cardio:    S1/S2, RRR, No murmure, No Rubs or Gallops   Abdomen:  Soft, non-tender, bowel sounds active all four quadrants, no guarding or peritoneal signs.  Muscular  skeletal:  Limited exam -global generalized weaknesses - in bed, able to move all 4  extremities,   2+ pulses,  symmetric, No pitting edema  Skin:  Dry, warm to touch, negative for any Rashes,  Wounds: Please see nursing documentation        no new focal deficits, no tremors  Data Reviewed: I have personally reviewed following labs and imaging studies  CBC: Recent Labs  Lab 09/25/23 0956 09/27/23 0456 09/29/23 0806 09/30/23 0247 09/30/23 1057  WBC 8.6 3.3* 7.7 5.7  --   NEUTROABS 7.2  --   --   --   --   HGB 10.0* 8.1* 9.0* 7.7* 8.9*  HCT 31.1* 25.2* 28.2* 25.2* 27.6*  MCV 94.5 96.2 95.9 95.5  --   PLT 188 152 229 187  --    Basic Metabolic Panel: Recent Labs  Lab 09/25/23 0956 09/26/23 0444 09/27/23 0354 09/29/23 0806 09/30/23 0247  NA 126* 127* 131* 132* 129*  K 3.8 3.4* 4.7 3.5 3.7  CL 92* 96* 101 94* 96*  CO2 18* 23 23 22 23   GLUCOSE 118* 137* 123* 89 100*  BUN 8 8 10 9 12   CREATININE 0.94 0.73 0.64 0.77 0.80  CALCIUM  8.2* 7.8* 8.0* 8.1* 8.1*  MG  --   --   --  1.5*  --   PHOS  --   --  3.9  --   --    GFR: Estimated Creatinine Clearance: 93.8 mL/min (by C-G formula based on SCr of 0.8 mg/dL). Liver Function Tests: Recent Labs  Lab 09/25/23 0956 09/27/23 0354 09/29/23 0806 09/30/23 0247  AST 37  --  38 38  ALT 17  --  35 39  ALKPHOS 99  --  84 81  BILITOT 1.9*  --  0.9 0.9  PROT 6.6  --  6.0* 5.9*  ALBUMIN  2.5* 2.1* 2.5* 2.6*    Recent Results (from the past 240 hours)  Blood Culture (routine x 2)  Status: None   Collection Time: 09/25/23  9:42 AM   Specimen: BLOOD  Result Value Ref Range Status   Specimen Description BLOOD RIGHT ARM  Final   Special Requests   Final    BOTTLES DRAWN AEROBIC AND ANAEROBIC Blood Culture adequate volume   Culture   Final    NO GROWTH 5 DAYS Performed at [facility], [address], [place], [place] [address]    Report Status 09/30/2023 FINAL  Final  Blood Culture (routine x 2)     Status: None   Collection Time: 09/25/23  9:56 AM   Specimen: BLOOD  Result Value Ref Range Status    Specimen Description BLOOD RIGHT HAND  Final   Special Requests   Final    BOTTLES DRAWN AEROBIC AND ANAEROBIC Blood Culture results may not be optimal due to an inadequate volume of blood received in culture bottles   Culture   Final    NO GROWTH 5 DAYS Performed at [facility], [address], [place], [place] [address]    Report Status 09/30/2023 FINAL  Final  Resp panel by RT-PCR (RSV, Flu A&B, Covid) Anterior Nasal Swab     Status: None   Collection Time: 09/25/23 11:00 AM   Specimen: Anterior Nasal Swab  Result Value Ref Range Status   SARS Coronavirus 2 by RT PCR NEGATIVE NEGATIVE Final    Comment: (NOTE) SARS-CoV-2 target nucleic acids are NOT DETECTED.  The SARS-CoV-2 RNA is generally detectable in upper respiratory specimens during the acute phase of infection. The lowest concentration of SARS-CoV-2 viral copies this assay can detect is 138 copies/mL. A negative result does not preclude SARS-Cov-2 infection and should not be used as the sole basis for treatment or other patient management decisions. A negative result may occur with  improper specimen collection/handling, submission of specimen other than nasopharyngeal swab, presence of viral mutation(s) within the areas targeted by this assay, and inadequate number of viral copies(<138 copies/mL). A negative result must be combined with clinical observations, patient history, and epidemiological information. The expected result is Negative.  Fact Sheet for Patients:  BloggerCourse.com  Fact Sheet for Healthcare Providers:  SeriousBroker.it  This test is no t yet approved or cleared by the [place]  FDA and  has been authorized for detection and/or diagnosis of SARS-CoV-2 by FDA under an Emergency Use Authorization (EUA). This EUA will remain  in effect (meaning this test can be used) for the duration of the COVID-19 declaration under Section 564(b)(1) of the Act,  21 U.S.C.section 360bbb-3(b)(1), unless the authorization is terminated  or revoked sooner.       Influenza A by PCR NEGATIVE NEGATIVE Final   Influenza B by PCR NEGATIVE NEGATIVE Final    Comment: (NOTE) The Xpert Xpress SARS-CoV-2/FLU/RSV plus assay is intended as an aid in the diagnosis of influenza from Nasopharyngeal swab specimens and should not be used as a sole basis for treatment. Nasal washings and aspirates are unacceptable for Xpert Xpress SARS-CoV-2/FLU/RSV testing.  Fact Sheet for Patients: BloggerCourse.com  Fact Sheet for Healthcare Providers: SeriousBroker.it  This test is not yet approved or cleared by the [place]  FDA and has been authorized for detection and/or diagnosis of SARS-CoV-2 by FDA under an Emergency Use Authorization (EUA). This EUA will remain in effect (meaning this test can be used) for the duration of the COVID-19 declaration under Section 564(b)(1) of the Act, 21 U.S.C. section 360bbb-3(b)(1), unless the authorization is terminated or revoked.     Resp Syncytial  Virus by PCR NEGATIVE NEGATIVE Final    Comment: (NOTE) Fact Sheet for Patients: BloggerCourse.com  Fact Sheet for Healthcare Providers: SeriousBroker.it  This test is not yet approved or cleared by the [place]  FDA and has been authorized for detection and/or diagnosis of SARS-CoV-2 by FDA under an Emergency Use Authorization (EUA). This EUA will remain in effect (meaning this test can be used) for the duration of the COVID-19 declaration under Section 564(b)(1) of the Act, 21 U.S.C. section 360bbb-3(b)(1), unless the authorization is terminated or revoked.  Performed at [facility], [address], [place], [place] [address]   MRSA Next Gen by PCR, Nasal     Status: None   Collection Time: 09/25/23 12:42 PM   Specimen: Nasal Mucosa; Nasal Swab  Result Value Ref  Range Status   MRSA by PCR Next Gen NOT DETECTED NOT DETECTED Final    Comment: (NOTE) The GeneXpert MRSA Assay (FDA approved for NASAL specimens only), is one component of a comprehensive MRSA colonization surveillance program. It is not intended to diagnose MRSA infection nor to guide or monitor treatment for MRSA infections. Test performance is not FDA approved in patients less than 30 years old. Performed at [place] [facility], [address] [place], [place] [address]   Fungus Culture With Stain     Status: None (Preliminary result)   Collection Time: 09/27/23  1:45 PM   Specimen: Pleural, Right  Result Value Ref Range Status   Fungus Stain Final report  Final    Comment: (NOTE) Performed At: [facility] [address] [place], [place] [id number] Pearlean Botts MD MV:[id number]    Fungus (Mycology) Culture PENDING  Incomplete   Fungal Source RIGHT  Final    Comment: PLEURAL Performed at [facility], [address] [place], [place] [address]   Gram stain     Status: None   Collection Time: 09/27/23  1:45 PM   Specimen: Pleura  Result Value Ref Range Status   Specimen Description PLEURAL  Final   Special Requests PLEURAL  Final   Gram Stain   Final    NO ORGANISMS SEEN WBC PRESENT, PREDOMINANTLY MONONUCLEAR CYTOSPIN SMEAR Performed at [facility], [address] [place], [place] [address]    Report Status 09/27/2023 FINAL  Final  Culture, body fluid w Gram Stain-bottle     Status: None (Preliminary result)   Collection Time: 09/27/23  1:45 PM   Specimen: Pleura  Result Value Ref Range Status   Specimen Description PLEURAL  Final   Special Requests   Final    BOTTLES DRAWN AEROBIC AND ANAEROBIC Blood Culture adequate volume   Culture   Final    NO GROWTH 3 DAYS Performed at [facility], [address] [place], [place] [address]    Report Status PENDING  Incomplete  Fungus Culture Result     Status: None   Collection Time: 09/27/23  1:45 PM  Result Value Ref  Range Status   Result 1 Comment  Final    Comment: (NOTE) KOH/Calcofluor preparation:  no fungus observed. Performed At: [facility] [address] [place], [place] [id number] Pearlean Botts MD QV:[id number]     Radiology Studies: DG Chest 2 View Result Date: 09/29/2023 CLINICAL DATA:  Shortness of breath. EXAM: CHEST - 2 VIEW COMPARISON:  Same day. FINDINGS: The heart size and mediastinal contours are within normal limits. Right internal jugular Port-A-Cath is unchanged. Stable bilateral lung opacities are noted concerning for multifocal pneumonia. Small right pleural effusion is noted.  The visualized skeletal structures are unremarkable. IMPRESSION: Stable bilateral lung opacities concerning for multifocal pneumonia with small right pleural effusion. Electronically Signed   By: Rosalene Colon M.D.   On: 09/29/2023 13:30   DG CHEST PORT 1 VIEW Result Date: 09/29/2023 CLINICAL DATA:  Shortness of breath EXAM: PORTABLE CHEST 1 VIEW COMPARISON:  September 27, 2023 FINDINGS: No change right IJ Infusaport catheter. Persistent unchanged bilateral basilar pulmonary infiltrates with consolidative changes of the left lower lobe pleural reaction of the right lower lobe. Previously seen lucency within the right lateral costophrenic sulcus is not seen on today's images. No evidence of pneumothorax. IMPRESSION: Persistent unchanged bilateral basilar pulmonary infiltrates with consolidative changes of the left lower lobe pleural reaction of the right lower lobe. No significant change with persistent pulmonary infiltrates and right pleural effusion Electronically Signed   By: Fredrich Jefferson M.D.   On: 09/29/2023 08:22     Scheduled Meds:  sodium chloride    Intravenous Once   aspirin  EC  81 mg Oral Q breakfast   budesonide  (PULMICORT ) nebulizer solution  0.25 mg Nebulization BID   Chlorhexidine  Gluconate Cloth  6 each Topical Daily   dextromethorphan -guaiFENesin   1 tablet Oral BID   DULoxetine   20 mg Oral  BID   feeding supplement  237 mL Oral BID BM   fluticasone  furoate-vilanterol  1 puff Inhalation Daily   folic acid   1 mg Oral Daily   hydrOXYzine   25 mg Oral QHS   ipratropium  0.5 mg Nebulization TID   levalbuterol   0.63 mg Nebulization TID   levofloxacin   750 mg Oral Daily   levothyroxine   100 mcg Oral Daily   lumateperone  tosylate  42 mg Oral QHS   metoprolol  tartrate  12.5 mg Oral BID   pantoprazole   40 mg Oral BID   polyethylene glycol powder  17 g Oral Daily   predniSONE   40 mg Oral Q breakfast   senna-docusate  2 tablet Oral BID   sodium chloride  flush  3 mL Intravenous Q12H   Continuous Infusions:     LOS: 5 days    Bobbetta Burnet M.D on 09/30/2023 at 11:42 AM  Go to [email or web address] - for contact info  [facility]  Time spent 55 minutes-seeing evaluate patient, reviewing all medical records, labs, meds, drawn plan of care   - Office  [phone] If 7PM-7AM, please contact night-coverage [email or web address] 09/30/2023, 11:42 AM

## 2023-10-01 DIAGNOSIS — J189 Pneumonia, unspecified organism: Secondary | ICD-10-CM | POA: Diagnosis not present

## 2023-10-01 LAB — COMPREHENSIVE METABOLIC PANEL WITH GFR
ALT: 41 U/L (ref 0–44)
AST: 36 U/L (ref 15–41)
Albumin: 2.6 g/dL — ABNORMAL LOW (ref 3.5–5.0)
Alkaline Phosphatase: 78 U/L (ref 38–126)
Anion gap: 10 (ref 5–15)
BUN: 12 mg/dL (ref 8–23)
CO2: 23 mmol/L (ref 22–32)
Calcium: 8.3 mg/dL — ABNORMAL LOW (ref 8.9–10.3)
Chloride: 98 mmol/L (ref 98–111)
Creatinine, Ser: 0.83 mg/dL (ref 0.61–1.24)
GFR, Estimated: >60 mL/min (ref >60)
Glucose, Bld: 79 mg/dL (ref 70–99)
Potassium: 3.7 mmol/L (ref 3.5–5.1)
Sodium: 131 mmol/L — ABNORMAL LOW (ref 135–145)
Total Bilirubin: 1.1 mg/dL (ref 0.0–1.2)
Total Protein: 5.8 g/dL — ABNORMAL LOW (ref 6.5–8.1)

## 2023-10-01 LAB — CBC
HCT: 30.7 % — ABNORMAL LOW (ref 39.0–52.0)
Hemoglobin: 10.1 g/dL — ABNORMAL LOW (ref 13.0–17.0)
MCH: 31.1 pg (ref 26.0–34.0)
MCHC: 32.9 g/dL (ref 30.0–36.0)
MCV: 94.5 fL (ref 80.0–100.0)
Platelets: 235 10*3/uL (ref 150–400)
RBC: 3.25 MIL/uL — ABNORMAL LOW (ref 4.22–5.81)
RDW: 16.9 % — ABNORMAL HIGH (ref 11.5–15.5)
WBC: 7.9 10*3/uL (ref 4.0–10.5)
nRBC: 0.3 % — ABNORMAL HIGH (ref 0.0–0.2)

## 2023-10-01 LAB — TYPE AND SCREEN
ABO/RH(D): O POS
Antibody Screen: POSITIVE
Donor AG Type: NEGATIVE
Unit division: 0

## 2023-10-01 MED ORDER — LEVALBUTEROL HCL 0.63 MG/3ML IN NEBU: 0.6300 mg | INHALATION_SOLUTION | Freq: Three times a day (TID) | RESPIRATORY_TRACT | 12 refills | Status: DC

## 2023-10-01 MED ORDER — POTASSIUM CHLORIDE CRYS ER 20 MEQ PO TBCR
20.0000 meq | EXTENDED_RELEASE_TABLET | Freq: Once | ORAL | Status: AC
  Administered 2023-10-01: 20 meq via ORAL
  Filled 2023-10-01: qty 1

## 2023-10-01 MED ORDER — IPRATROPIUM BROMIDE 0.02 % IN SOLN: 0.5000 mg | Freq: Three times a day (TID) | RESPIRATORY_TRACT | 12 refills | Status: DC

## 2023-10-01 MED ORDER — CARVEDILOL 3.125 MG PO TABS
3.1250 mg | ORAL_TABLET | Freq: Two times a day (BID) | ORAL | 1 refills | Status: AC
Start: 2023-10-01 — End: 2023-12-19

## 2023-10-01 MED ORDER — FUROSEMIDE 10 MG/ML IJ SOLN
20.0000 mg | Freq: Two times a day (BID) | INTRAMUSCULAR | Status: DC
  Administered 2023-10-01: 20 mg via INTRAVENOUS
  Filled 2023-10-01: qty 2

## 2023-10-01 MED ORDER — FLUTICASONE FUROATE-VILANTEROL 100-25 MCG/INH IN AEPB: 1.0000 | INHALATION_SPRAY | Freq: Every day | RESPIRATORY_TRACT | 3 refills | Status: DC

## 2023-10-01 MED ORDER — METHYLPREDNISOLONE 4 MG PO TBPK: ORAL_TABLET | ORAL | 0 refills | Status: DC

## 2023-10-01 MED ORDER — CARVEDILOL 3.125 MG PO TABS
3.1250 mg | ORAL_TABLET | Freq: Two times a day (BID) | ORAL | Status: DC
  Administered 2023-10-01 (×2): 3.125 mg via ORAL
  Filled 2023-10-01 (×2): qty 1

## 2023-10-01 NOTE — TOC Transition Note (Signed)
 Transition of Care [facility]) - Discharge Note   Patient Details  Name: Joseph Atkins MRN: [id number] Date of Birth: [birth year]  Transition of Care [facility]) CM/SW Contact:  Clara Crisp, LCSW Phone Number: 10/01/2023, 1:18 PM   Clinical Narrative:    Patient in need of oxygen  and a nebulizer. Wife selects [employer]. LCSW spoke with Ada with [employer] and oxygen  will be delivered to patient.    Final next level of care: Home/Self Care Barriers to Discharge: Continued Medical Work up   Patient Goals and CMS Choice Patient states their goals for this hospitalization and ongoing recovery are:: to get better CMS Medicare.gov Compare Post Acute Care list provided to:: Patient Choice offered to / list presented to : Patient      Discharge Placement                       Discharge Plan and Services Additional resources added to the After Visit Summary for                  DME Arranged: Oxygen , Nebulizer machine DME Agency: [employer] Date DME Agency Contacted: 10/01/23 Time DME Agency Contacted: 1312 Representative spoke with at DME Agency: Ada            Social Drivers of Health (SDOH) Interventions SDOH Screenings   Food Insecurity: No Food Insecurity (09/25/2023)  Housing: Low Risk  (09/25/2023)  Transportation Needs: No Transportation Needs (09/25/2023)  Utilities: Not At Risk (09/25/2023)  Alcohol Screen: Low Risk  (10/31/2019)  Financial Resource Strain: Medium Risk (10/31/2019)  Physical Activity: Insufficiently Active (10/31/2019)  Social Connections: Moderately Isolated (10/31/2019)  Stress: Stress Concern Present (10/31/2019)  Tobacco Use: Medium Risk (09/25/2023)     Readmission Risk Interventions    09/26/2023   11:59 AM  Readmission Risk Prevention Plan  Transportation Screening Complete  PCP or Specialist Appt within 3-5 Days Not Complete  HRI or Home Care Consult Complete  Social Work Consult for Recovery Care Planning/Counseling Complete  Palliative Care  Screening Complete  Medication Review Oceanographer) Complete

## 2023-10-01 NOTE — Plan of Care (Signed)
  Problem: Clinical Measurements: Goal: Ability to maintain clinical measurements within normal limits will improve Outcome: Progressing Goal: Will remain free from infection Outcome: Progressing Goal: Diagnostic test results will improve Outcome: Progressing Goal: Respiratory complications will improve Outcome: Progressing Goal: Cardiovascular complication will be avoided Outcome: Progressing   Problem: Activity: Goal: Risk for activity intolerance will decrease Outcome: Progressing   Problem: Coping: Goal: Level of anxiety will decrease Outcome: Progressing   Problem: Elimination: Goal: Will not experience complications related to bowel motility Outcome: Progressing Goal: Will not experience complications related to urinary retention Outcome: Progressing

## 2023-10-01 NOTE — Final Progress Note (Addendum)
 SATURATION QUALIFICATIONS: (This note is used to comply with regulatory documentation for home oxygen )  Patient Saturations on Room Air at Rest = 97%  Patient Saturations on Room Air while Ambulating = 87%  Patient Saturations on 2 Liters of oxygen  while Ambulating = 100%  Please briefly explain why patient needs home oxygen : Patient qualifies for [facility] oxygen  as evidenced by o2 sat droppig to 87% while ambulating on room air

## 2023-10-01 NOTE — Progress Notes (Signed)
   10/01/23 1321  Spiritual Encounters  Type of Visit Initial  Care provided to: Patient;Family  Conversation partners present during encounter Nurse  Referral source APP  Reason for visit Advance directives  OnCall Visit No  Spiritual Framework  Presenting Themes Meaning/purpose/sources of inspiration;Impactful experiences and emotions;Goals in life/care;Courage hope and growth;Significant life change  Community/Connection Family  Patient Stress Factors Loss of control;Major life changes;Exhausted  Family Stress Factors Exhausted  Interventions  Spiritual Care Interventions Made Compassionate presence;Reflective listening;Normalization of emotions;Meaning making;Prayer  Intervention Outcomes  Outcomes Awareness around self/spiritual resourses;Reduced anxiety;Reduced fear;Autonomy/agency;Connection to spiritual care  Spiritual Care Plan  Spiritual Care Issues Still Outstanding No further spiritual care needs at this time (see row info)   Received referral in order to complete an AD. Patient has the paperwork filled out, just ned a notary and witnesses. I have secured this for 11AM tomorrow morning. If patient discharges before then, I gave him instructions on how to do so outpatient.  While there, Chaplain engaged patient in reflection around his illness and he shared how difficult it has been the last 4 years. He shared how so very tired he is and also how grateful he is for many things. Chaplain normalized feelings, provided safe space for reflection, and prayer to close the visit. Patient and wife grateful for support.  Will remain available in order to provide spiritual support and to assess for spiritual need.   Rev. Madeleine Schaffer, M.Div Chaplain

## 2023-10-01 NOTE — Discharge Summary (Signed)
 Physician Discharge Summary   Patient: Joseph Atkins MRN: [id number] DOB: 03/19/1961  Admit date:     09/25/2023  Discharge date: 10/01/23  Discharge Physician: Bobbetta Burnet   PCP: [name], NP   Recommendations at discharge:    Continue your wear your oxygen  at home, may increase O2 up with exertion O2 saturation recommended between 88-92% Continue inhalers Follow-up with your PCP and oncologist as scheduled  Discharge Diagnoses: Principal Problem:   CAP (community acquired pneumonia) Active Problems:   COPD exacerbation (HCC)   Acute respiratory failure with hypoxia (HCC)   Malignant neoplasm metastatic to brain /nonsmall cell adenocarcinoma of his lun   Adenocarcinoma of right lung, stage 4 with Brain Mets/nonsmall cell adenocarcinoma of his lun   Hypothyroidism   Essential hypertension   Goals of care, counseling/discussion  Resolved Problems:   * No resolved hospital problems. *  Hospital Course: MADOX CORKINS is a 63 y.o. male with medical history significant for GERD, HTN, hypothyroidism, HLD, alcohol and tobacco abuse, stage IV lung cancer with brain mets, recent diagnosis of community-acquired pneumonia on 09/22/2023, treated on Levaquin  (Levaquin  day # 4 at the time of admission) as outpatient but clinically radiologically appears to have worsened. - Admitted 09/25/2023 with acute hypoxic respiratory failure secondary to worsening CAP.   CAP--with acute respiratory failure with pleural effusion - O2 demand down to 3-1/2 L of oxygen , satting 97% -O2 goal for him is 88-92%   - During this admission patient was up to 13 L of high flow nasal cannula in ICU, - He is not O2 dependent at baseline   -Blood pressure is soft at 99/58>> 106/76 this morning - Chest x-ray history reviewed, no progressive -S/p treatment with albumin  and Lasix     - Tapering down from IV to p.o. steroids     recent diagnosis of community-acquired pneumonia on 09/22/2023,  treated on Levaquin  (Levaquin  day # 4) as outpatient  -WBC 8.6, 3.3, 5.7 - Lactic acid 1.6 >> 1.8 - RSV, COVID and flu are negative -Sepsis was ruled out - Please see chest x-ray from 09/22/2023 and CTA chest from 09/25/2023     - S/p 5 days of IV Rocephin /azithromycin  along with bronchodilators and mucolytics  -Cultures remain with no growth    -09/27/2023 s/p - thoracentesis yielding 700 mL fluid - Continue IV  antibiotics and steroids-tapering off and changing to p.o.   Acute hypoxic respiratory failure--- with history of COPD, lung CA, In the setting of pneumonia and pleural effusion   POA:  hypoxic with O2 sats of 72% on room air was placed on 15 l HFNC in ICU -Not previously on oxygen  -Will most likely need oxygen  at discharge - Continue to complain shortness of breath, up to 7 L at rest, 13 L, -Continue nebs, tapering down steroids and antibiotic -Still experiencing shortness of breath and hypoxia especially overnight   His goal O2 sat would be 88-92% with minimal supplemental oxygen  We will be discharging the patient home on 4 L of oxygen ,     Stage IV (T3, N0, M1C) non-small cell lung cancer, adenocarcinoma  -with mets to the brain -outpatient follow-up Dr. Randall Bush advised -- Hold Keytruda  due to acute infection    Social/Ethics--- palliative care consult appreciated - Patient remains full code   Chronic anemia of malignancy and chemotherapy- Symptomatic anemia -baseline hemoglobin 10, dropped to as low as 8.1,  Due to being symptomatic, hypotensive, hypoxic 1 unit of PRBC was transfused successfully  -  Continue folic acid  - Monitor Hgb closely and transfuse as indicated   Hypothyroidism--continue levothyroxine    GERD--continue Protonix    Tobacco Abuse/acute COPD exacerbation --- due to #1 above - continue bronchodilators - Solu-Medrol  as ordered   Depression/Anxiety--stable,  --continue Cymbalta  May use hydroxyzine  as needed   Anemia of chronic  disease-in setting of lung cancer - Per patient his wife postchemotherapy always his hemoglobin drops   Due to hypotension, hypoxia, generalized weaknesses-drop in his hemoglobin from baseline --we will transfuse 1 unit of PRBC today 09/30/2023        Procedures performed: Thoracentesis, blood transfusion, IV antibiotics Disposition: Home Diet recommendation:  Discharge Diet Orders (From admission, onward)     Start     Ordered   10/01/23 0000  Diet - low sodium heart healthy        10/01/23 1138           Regular diet DISCHARGE MEDICATION: Allergies as of 10/01/2023       Reactions   Penicillins Other (See Comments)   Childhood allergy        Medication List     STOP taking these medications    amLODipine  5 MG tablet Commonly known as: NORVASC    levofloxacin  750 MG tablet Commonly known as: Levaquin        TAKE these medications    acetaminophen  500 MG tablet Commonly known as: TYLENOL  Take 1,000 mg by mouth every 6 (six) hours as needed for mild pain (pain score 1-3).   albuterol  108 (90 Base) MCG/ACT inhaler Commonly known as: VENTOLIN  HFA Inhale 2 puffs into the lungs every 6 (six) hours as needed for wheezing or shortness of breath.   Caplyta  10.5 MG capsule Generic drug: lumateperone  tosylate Take 42 mg by mouth at bedtime.   carvedilol 3.125 MG tablet Commonly known as: COREG Take 1 tablet (3.125 mg total) by mouth 2 (two) times daily with a meal.   [facility] Aspirin  Low Dose 81 MG tablet Generic drug: aspirin  EC TAKE 1 TABLET (81 MG TOTAL) BY MOUTH DAILY. SWALLOW WHOLE.   DULoxetine  20 MG capsule Commonly known as: CYMBALTA  Take 1 capsule (20 mg total) by mouth 2 (two) times daily. What changed:  how much to take when to take this   fluticasone  furoate-vilanterol 100-25 MCG/INH Aepb Commonly known as: BREO ELLIPTA  Inhale 1 puff into the lungs daily.   folic acid  1 MG tablet Commonly known as: FOLVITE  TAKE 1 TABLET BY MOUTH EVERY DAY    hydrOXYzine  25 MG tablet Commonly known as: ATARAX  Take 25 mg by mouth daily.   ipratropium 0.02 % nebulizer solution Commonly known as: ATROVENT  Take 2.5 mLs (0.5 mg total) by nebulization 3 (three) times daily.   Keytruda  100 MG/4ML Soln Generic drug: pembrolizumab  Inject 2 mg/kg into the vein every 21 ( twenty-one) days.   levalbuterol  0.63 MG/3ML nebulizer solution Commonly known as: XOPENEX  Take 3 mLs (0.63 mg total) by nebulization 3 (three) times daily.   levothyroxine  100 MCG tablet Commonly known as: SYNTHROID  TAKE 1 TABLET BY MOUTH EVERY DAY   loratadine 10 MG tablet Commonly known as: CLARITIN Take 10 mg by mouth daily.   LORazepam  1 MG tablet Commonly known as: Ativan  Take 1 tablet (1 mg total) by mouth once as needed for up to 1 dose for anxiety (MRI claustrophobia).   methylPREDNISolone  4 MG Tbpk tablet Commonly known as: MEDROL  DOSEPAK Medrol  Dosepak take as instructed   omeprazole  40 MG capsule Commonly known as: PRILOSEC Take 1 capsule (40  mg total) by mouth daily.   ondansetron  4 MG tablet Commonly known as: ZOFRAN  Take 1 tablet (4 mg total) by mouth every 8 (eight) hours as needed. What changed: reasons to take this   polyethylene glycol powder 17 GM/SCOOP powder Commonly known as: MiraLax  Take 17 g by mouth daily.   prochlorperazine  10 MG tablet Commonly known as: COMPAZINE  Take 1 tablet (10 mg total) by mouth every 6 (six) hours as needed. What changed: reasons to take this               Durable Medical Equipment  (From admission, onward)           Start     Ordered   10/01/23 0000  For home use only DME Nebulizer machine       Question Answer Comment  Patient needs a nebulizer to treat with the following condition COPD (chronic obstructive pulmonary disease) (HCC)   Length of Need 12 Months   Additional equipment included Administration kit      10/01/23 1138   09/30/23 1153  For home use only DME oxygen   Once        Question Answer Comment  Length of Need 12 Months   Mode or (Route) Nasal cannula   Liters per Minute 4   Frequency Continuous (stationary and portable oxygen  unit needed)   Oxygen  delivery system Gas      09/30/23 1152            Discharge Exam: Filed Weights   09/25/23 0938 09/25/23 1244 09/27/23 0424  Weight: 75.8 kg 72 kg 72 kg        General:  AAO x 3,  cooperative, no distress;   HEENT:  Normocephalic, PERRL, otherwise with in Normal limits   Neuro:  CNII-XII intact. , normal motor and sensation, reflexes intact   Lungs:   Clear to auscultation BL, Respirations unlabored,  No wheezes / crackles  Cardio:    S1/S2, RRR, No murmure, No Rubs or Gallops   Abdomen:  Soft, non-tender, bowel sounds active all four quadrants, no guarding or peritoneal signs.  Muscular  skeletal:  Limited exam -global generalized weaknesses - in bed, able to move all 4 extremities,   2+ pulses,  symmetric, No pitting edema  Skin:  Dry, warm to touch, negative for any Rashes,  Wounds: Please see nursing documentation          Condition at discharge: fair  The results of significant diagnostics from this hospitalization (including imaging, microbiology, ancillary and laboratory) are listed below for reference.   Imaging Studies: ECHOCARDIOGRAM COMPLETE Result Date: 09/30/2023    ECHOCARDIOGRAM REPORT   Patient Name:   XAVIAN HARDCASTLE Date of Exam: 09/30/2023 Medical Rec #:  [id number]         Height:       68.0 in Accession #:    [id number]        Weight:       158.7 lb Date of Birth:  [birth year]         BSA:          1.853 m Patient Age:    62 years          BP:           106/76 mmHg Patient Gender: M                 HR:           53 bpm. [facility] Procedure: 2D Echo, Cardiac Doppler and Color Doppler (Both Spectral and Color            Flow Doppler were utilized during procedure). Indications:    Chest Pain R07.9                 CHF-Acute Diastolic I50.31  History:         Patient has no prior history of Echocardiogram examinations.                 COPD; Risk Factors:Hypertension, Dyslipidemia and Former Smoker.  Sonographer:    Denese Finn RCS Referring Phys: [id number] Ladarian Bonczek A Shail Urbas  Sonographer Comments: Image acquisition challenging due to respiratory motion. IMPRESSIONS  1. Left ventricular ejection fraction, by estimation, is 55 to 60%. The left ventricle has normal function. The left ventricle has no regional wall motion abnormalities. Left ventricular diastolic parameters were normal.  2. Right ventricular systolic function is normal. The right ventricular size is normal.  3. The mitral valve is abnormal. No evidence of mitral valve regurgitation. No evidence of mitral stenosis.  4. The aortic valve was not well visualized. There is mild calcification of the aortic valve. There is mild thickening of the aortic valve. Aortic valve regurgitation is trivial. Mild aortic valve stenosis.  5. The inferior vena cava is normal in size with greater than 50% respiratory variability, suggesting right atrial pressure of 3 mmHg. FINDINGS  Left Ventricle: Left ventricular ejection fraction, by estimation, is 55 to 60%. The left ventricle has normal function. The left ventricle has no regional wall motion abnormalities. Strain was performed and the global longitudinal strain is indeterminate. The left ventricular internal cavity size was normal in size. There is no left ventricular hypertrophy. Left ventricular diastolic parameters were normal. Right Ventricle: The right ventricular size is normal. No increase in right ventricular wall thickness. Right ventricular systolic function is normal. Left Atrium: Left atrial size was normal in size. Right Atrium: Right atrial size was normal in size. Pericardium: There is no evidence of pericardial effusion. Mitral Valve: The mitral valve is abnormal. There is mild thickening of the mitral valve leaflet(s). There is mild calcification of the  mitral valve leaflet(s). No evidence of mitral valve regurgitation. No evidence of mitral valve stenosis. Tricuspid Valve: The tricuspid valve is normal in structure. Tricuspid valve regurgitation is not demonstrated. No evidence of tricuspid stenosis. Aortic Valve: The aortic valve was not well visualized. There is mild calcification of the aortic valve. There is mild thickening of the aortic valve. Aortic valve regurgitation is trivial. Mild aortic stenosis is present. Aortic valve mean gradient measures 10.0 mmHg. Aortic valve peak gradient measures 18.4 mmHg. Aortic valve area, by VTI measures 0.94 cm. Pulmonic Valve: The pulmonic valve was normal in structure. Pulmonic valve regurgitation is not visualized. No evidence of pulmonic stenosis. Aorta: The aortic root is normal in size and structure. Venous: The inferior vena cava is normal in size with greater than 50% respiratory variability, suggesting right atrial pressure of 3 mmHg. IAS/Shunts: No atrial level shunt detected by color flow Doppler. Additional Comments: 3D was performed not requiring image post processing on an independent workstation and was indeterminate.  LEFT VENTRICLE PLAX 2D LVIDd:         4.10 cm   Diastology LVIDs:         2.95 cm   LV e' medial:    7.29 cm/s LV PW:         0.95 cm  LV E/e' medial:  9.8 LV IVS:        0.90 cm   LV e' lateral:   7.29 cm/s LVOT diam:     1.60 cm   LV E/e' lateral: 9.8 LV SV:         36 LV SV Index:   20 LVOT Area:     2.01 cm  RIGHT VENTRICLE RV S prime:     9.14 cm/s TAPSE (M-mode): 1.6 cm LEFT ATRIUM           Index        RIGHT ATRIUM           Index LA diam:      3.35 cm 1.81 cm/m   RA Area:     12.20 cm LA Vol (A2C): 34.8 ml 18.78 ml/m  RA Volume:   25.30 ml  13.66 ml/m LA Vol (A4C): 33.7 ml 18.19 ml/m  AORTIC VALVE AV Area (Vmax):    0.82 cm AV Area (Vmean):   0.77 cm AV Area (VTI):     0.94 cm AV Vmax:           214.50 cm/s AV Vmean:          147.500 cm/s AV VTI:            0.384 m AV Peak  Grad:      18.4 mmHg AV Mean Grad:      10.0 mmHg LVOT Vmax:         87.50 cm/s LVOT Vmean:        56.500 cm/s LVOT VTI:          0.180 m LVOT/AV VTI ratio: 0.47  AORTA Ao Root diam: 3.10 cm MITRAL VALVE MV Area (PHT): 4.26 cm    SHUNTS MV Decel Time: 178 msec    Systemic VTI:  0.18 m MV E velocity: 71.55 cm/s  Systemic Diam: 1.60 cm MV A velocity: 64.65 cm/s MV E/A ratio:  1.11 Janelle Mediate MD Electronically signed by Janelle Mediate MD Signature Date/Time: 09/30/2023/4:35:08 PM    Final    DG Chest 2 View Result Date: 09/29/2023 CLINICAL DATA:  Shortness of breath. EXAM: CHEST - 2 VIEW COMPARISON:  Same day. FINDINGS: The heart size and mediastinal contours are within normal limits. Right internal jugular Port-A-Cath is unchanged. Stable bilateral lung opacities are noted concerning for multifocal pneumonia. Small right pleural effusion is noted. The visualized skeletal structures are unremarkable. IMPRESSION: Stable bilateral lung opacities concerning for multifocal pneumonia with small right pleural effusion. Electronically Signed   By: Rosalene Colon M.D.   On: 09/29/2023 13:30   DG CHEST PORT 1 VIEW Result Date: 09/29/2023 CLINICAL DATA:  Shortness of breath EXAM: PORTABLE CHEST 1 VIEW COMPARISON:  September 27, 2023 FINDINGS: No change right IJ Infusaport catheter. Persistent unchanged bilateral basilar pulmonary infiltrates with consolidative changes of the left lower lobe pleural reaction of the right lower lobe. Previously seen lucency within the right lateral costophrenic sulcus is not seen on today's images. No evidence of pneumothorax. IMPRESSION: Persistent unchanged bilateral basilar pulmonary infiltrates with consolidative changes of the left lower lobe pleural reaction of the right lower lobe. No significant change with persistent pulmonary infiltrates and right pleural effusion Electronically Signed   By: Fredrich Jefferson M.D.   On: 09/29/2023 08:22   DG Chest Port 1 View Result Date: 09/27/2023 CLINICAL  DATA:  Post thoracentesis EXAM: PORTABLE CHEST 1 VIEW COMPARISON:  09/27/2023 FINDINGS: Decreasing right pleural effusion following thoracentesis.  Probable pleural air laterally at the right lung base in the area of prior effusion suggesting stuck lung. Small residual right pleural effusion. Right Port-A-Cath is unchanged. Bilateral airspace opacities are unchanged. IMPRESSION: Decreasing right effusion following thoracentesis. Probable pleural air/pneumothorax laterally at the right lung base in the area of prior effusions suggesting stuck lung. Stable bilateral airspace disease. Electronically Signed   By: Janeece Mechanic M.D.   On: 09/27/2023 14:16   US  THORACENTESIS ASP PLEURAL SPACE W/IMG GUIDE Result Date: 09/27/2023 INDICATION: 63 year old male. History of metastatic lung cancer. Request is for diagnostic and therapeutic thoracentesis EXAM: ULTRASOUND GUIDED RIGHT-SIDED THERAPEUTIC AND DIAGNOSTIC THORACENTESIS MEDICATIONS: Lidocaine  1% 10 mL COMPLICATIONS: None immediate. PROCEDURE: An ultrasound guided thoracentesis was thoroughly discussed with the patient and questions answered. The benefits, risks, alternatives and complications were also discussed. The patient understands and wishes to proceed with the procedure. Written consent was obtained. Ultrasound was performed to localize and mark an adequate pocket of fluid in the right chest. The area was then prepped and draped in the normal sterile fashion. 1% Lidocaine  was used for local anesthesia. Under ultrasound guidance a 8 Fr Safe-T-Centesis catheter was introduced. Thoracentesis was performed. The catheter was removed and a dressing applied. FINDINGS: A total of approximately 700 mL of straw-colored fluid was removed. Samples were sent to the laboratory as requested by the clinical team. IMPRESSION: Successful ultrasound guided therapeutic and diagnostic right-sided thoracentesis yielding 700 mL of pleural fluid. Performed by Reagan Camera NP  Electronically Signed   By: Creasie Doctor M.D.   On: 09/27/2023 14:05   DG CHEST PORT 1 VIEW Result Date: 09/27/2023 CLINICAL DATA:  [id number] Dyspnea and respiratory abnormalities [id number] EXAM: PORTABLE CHEST - 1 VIEW COMPARISON:  09/25/2023 FINDINGS: Patchy alveolar opacities involving bases more than apices, generally stable since previous. Moderate right pleural effusion appears slightly increased. Stable right IJ port catheter. Heart size normal. Visualized bones unremarkable. IMPRESSION: 1. Slight increase in moderate right pleural effusion. 2. Stable patchy alveolar opacities. Electronically Signed   By: Nicoletta Barrier M.D.   On: 09/27/2023 08:00   CT Angio Chest PE W and/or Wo Contrast Result Date: 09/25/2023 CLINICAL DATA:  Pulmonary embolism suspected EXAM: CT ANGIOGRAPHY CHEST WITH CONTRAST TECHNIQUE: Multidetector CT imaging of the chest was performed using the standard protocol during bolus administration of intravenous contrast. Multiplanar CT image reconstructions and MIPs were obtained to evaluate the vascular anatomy. RADIATION DOSE REDUCTION: This exam was performed according to the departmental dose-optimization program which includes automated exposure control, adjustment of the mA and/or kV according to patient size and/or use of iterative reconstruction technique. CONTRAST:  75mL OMNIPAQUE  IOHEXOL  350 MG/ML SOLN COMPARISON:  August 16, 2023 FINDINGS: Cardiovascular: Satisfactory opacification of the pulmonary arteries to the segmental level. No evidence of pulmonary embolism. Normal heart size. No pericardial effusion. Mediastinum/Nodes: No enlarged mediastinal, hilar, or axillary lymph nodes. Thyroid  gland, trachea, and esophagus demonstrate no significant findings. Lungs/Pleura: Extensive bilateral pulmonary infiltrates which correlates with chronic interstitial lung disease with thickening of the interlobar interlobular septi with persistent right pleural effusion right lower lobe infiltrates and  atelectasis of the inferior medial segment of the right lower lobe that could correlate with superimposed pneumonia. There is significant retraction of the right lower lobe pulmonary vessels that could correlate with trapped lung as well. No suspicious pulmonary nodules. Upper Abdomen: No acute abnormality. Musculoskeletal: No chest wall abnormality. No acute or significant osseous findings. Review of the MIP images confirms the above findings. IMPRESSION: *No evidence of  pulmonary embolism. *Extensive bilateral pulmonary infiltrates which correlates with chronic interstitial lung disease with thickening of the interlobar interlobular septi with persistent right pleural effusion right lower lobe infiltrates and atelectasis of the inferior medial segment of the right lower lobe that could correlate with superimposed pneumonia. There is significant retraction of the right lower lobe pulmonary vessels that could correlate with trapped lung as well. Electronically Signed   By: Fredrich Jefferson M.D.   On: 09/25/2023 12:56   DG Chest Port 1 View Result Date: 09/25/2023 CLINICAL DATA:  Questionable sepsis EXAM: PORTABLE CHEST 1 VIEW COMPARISON:  Sep 22, 2023 FINDINGS: Persistent unchanged bilateral basilar infiltrates atelectasis and right pleural effusion and reaction. No change in the right IJ Infusaport catheter The heart normal size IMPRESSION: Persistent unchanged bilateral basilar infiltrates atelectasis and right pleural effusion and reaction. Electronically Signed   By: Fredrich Jefferson M.D.   On: 09/25/2023 10:34   DG Chest 2 View Result Date: 09/22/2023 CLINICAL DATA:  Suspected pneumonia - cx pt EXAM: CHEST - 2 VIEW COMPARISON:  August 16, 2023 FINDINGS: Right chest port in place terminating at the cavoatrial junction. Unchanged small to moderate right pleural effusion with right basilar airspace opacities. New airspace opacities in the left lung base. No left pleural effusion or pneumothorax. No cardiomegaly. No  acute fracture or destructive lesion. IMPRESSION: 1. New patchy airspace opacities in the left lung base, worrisome for a developing pneumonia. No left-sided pleural effusion. 2. Unchanged small to moderate right pleural effusion with similar right basilar airspace disease. Electronically Signed   By: Rance Burrows M.D.   On: 09/22/2023 12:26   MR BRAIN W WO CONTRAST Result Date: 09/12/2023 CLINICAL DATA:  Initial evaluation for brain/CNS neoplasm, assess treatment response. EXAM: MRI HEAD WITHOUT AND WITH CONTRAST TECHNIQUE: Multiplanar, multiecho pulse sequences of the brain and surrounding structures were obtained without and with intravenous contrast. CONTRAST:  8.5 mL of Vueway  COMPARISON:  Prior MRI from 06/01/2023 FINDINGS: Brain: Age-related cerebral atrophy with chronic microvascular ischemic disease. No acute or subacute infarct. Stable lesions: 1. Metastatic lesion involving the inferior left cerebellum is relatively stable in size measuring 2.8 x 2.0 x 1.9 cm. Surrounding edema within the adjacent left cerebellar hemisphere with extension into the left middle cerebellar peduncle, similar. Associated chronic blood products again noted. 2. Punctate lesion involving the left cerebellar vermis, similar (series 14, image 65), stable. 3. 7 mm cortically based lesion at the posterior right frontal lobe (series 14, image 133). 4. 8 mm lesion at the high posterior left parietal lobe (series 14, image 148). Smaller and/or resolved lesions: 1. 5 mm lesion at the left occipital lobe (series 14, image 95). 2. Previously seen punctate right occipital lesion is no longer visualized (series 13, image 58 on prior exam). 3. Subcentimeter posterior right temporal lesion no longer seen (series 13, image 70 on prior exam). Larger lesions: 1. 7 mm lesion at the left occipital lobe (series 14, image 101). 2. 10 mm lesion at the left occipital lobe (series 14, image 94). 3. 5 mm lesion at the right occipital lobe (series  14, image 85). 4. Multiple lesions clustered about the posterior falx at the bilateral occipital regions, all of which appear slightly increased in size from prior (series 14, image 80). Largest of these lesions measures up to 1.1 cm at the right occipital lobe. 5. Previously seen 2 adjacent lesions now appears as 1 lesion measuring up to 1 cm at the right temporal lobe (series 14, image 81).  6. 4 mm lesion at the anterior left frontal lobe (series 14, image 107). No definite new lesions on today's exam. Vascular: Major intravascular flow voids are maintained. Skull and upper cervical spine: Craniocervical junction within normal limits. Bone marrow signal intensity within normal limits. No scalp soft tissue abnormality. Sinuses/Orbits: Globes and orbital soft tissues within normal limits. Mucosal thickening with air-fluid level noted within the right maxillary sinus. Trace left mastoid effusion, of doubtful significance. Other: None. IMPRESSION: 1. Multiple intracranial metastases as above. Overall, these lesions are for the most part either stable or slightly increased in size as compared to prior MRI from 06/01/2023. No definite new lesions on today's exam. 2. Underlying age-related cerebral atrophy with chronic microvascular ischemic disease. 3. Mucosal thickening with air-fluid level within the right maxillary sinus. Clinical correlation for possible acute sinusitis recommended. Electronically Signed   By: Virgia Griffins M.D.   On: 09/12/2023 04:23   VAS US  LOWER EXTREMITY VENOUS (DVT) Result Date: 09/06/2023  Lower Venous DVT Study Patient Name:  NASIIR MONTS  Date of Exam:   09/06/2023 Medical Rec #: [id number]          Accession #:    [id number] Date of Birth: Sep 06, 1960          Patient Gender: M Patient Age:   35 years Exam Location:  [address] Procedure:      VAS US  LOWER EXTREMITY VENOUS (DVT) Referring Phys: Dalphine Duet  --------------------------------------------------------------------------------  Indications: Patient has left leg sciatica with bilateral calf pain. Patient has lung cancer with mets to the brain. He denies increased SOB and chest pains.  Comparison Study: None Performing Technologist: Alecia Mackin RVT, RDCS (AE), RDMS  Examination Guidelines: A complete evaluation includes B-mode imaging, spectral Doppler, color Doppler, and power Doppler as needed of all accessible portions of each vessel. Bilateral testing is considered an integral part of a complete examination. Limited examinations for reoccurring indications may be performed as noted. The reflux portion of the exam is performed with the patient in reverse Trendelenburg.  +---------+---------------+---------+-----------+----------+--------------+ RIGHT    CompressibilityPhasicitySpontaneityPropertiesThrombus Aging +---------+---------------+---------+-----------+----------+--------------+ CFV      Full           Yes      Yes                                 +---------+---------------+---------+-----------+----------+--------------+ SFJ      Full           Yes      Yes                                 +---------+---------------+---------+-----------+----------+--------------+ FV Prox  Full           Yes      Yes                                 +---------+---------------+---------+-----------+----------+--------------+ FV Mid   Full           Yes      Yes                                 +---------+---------------+---------+-----------+----------+--------------+ FV DistalFull           Yes      Yes                                 +---------+---------------+---------+-----------+----------+--------------+  PFV      Full                                                        +---------+---------------+---------+-----------+----------+--------------+ POP      Full           Yes      Yes                                  +---------+---------------+---------+-----------+----------+--------------+ PTV      Full           Yes      Yes                                 +---------+---------------+---------+-----------+----------+--------------+ PERO     Full           Yes      Yes                                 +---------+---------------+---------+-----------+----------+--------------+ Gastroc  Full                                                        +---------+---------------+---------+-----------+----------+--------------+ GSV      Full                                                        +---------+---------------+---------+-----------+----------+--------------+   +---------+---------------+---------+-----------+----------+--------------+ LEFT     CompressibilityPhasicitySpontaneityPropertiesThrombus Aging +---------+---------------+---------+-----------+----------+--------------+ CFV      Full           Yes      Yes                                 +---------+---------------+---------+-----------+----------+--------------+ SFJ      Full           Yes      Yes                                 +---------+---------------+---------+-----------+----------+--------------+ FV Prox  Full           Yes      Yes                                 +---------+---------------+---------+-----------+----------+--------------+ FV Mid   Full           Yes      Yes                                 +---------+---------------+---------+-----------+----------+--------------+ FV DistalFull  Yes      Yes                                 +---------+---------------+---------+-----------+----------+--------------+ PFV      Full                                                        +---------+---------------+---------+-----------+----------+--------------+ POP      Full           Yes      Yes                                  +---------+---------------+---------+-----------+----------+--------------+ PTV      Full           Yes      Yes                                 +---------+---------------+---------+-----------+----------+--------------+ PERO     Full           Yes      Yes                                 +---------+---------------+---------+-----------+----------+--------------+ Gastroc  Full                                                        +---------+---------------+---------+-----------+----------+--------------+ GSV      Full                                                        +---------+---------------+---------+-----------+----------+--------------+    Findings reported to Dr. Greg Leaks email through [facility] at 8:30 am.  Summary: BILATERAL: - No evidence of deep vein thrombosis seen in the lower extremities, bilaterally. - No evidence of superficial venous thrombosis in the lower extremities, bilaterally. -No evidence of popliteal cyst, bilaterally.   *See table(s) above for measurements and observations. Electronically signed by Angela Kell MD on 09/06/2023 at 1:24:11 PM.    Final     Microbiology: Results for orders placed or performed during the hospital encounter of 09/25/23  Blood Culture (routine x 2)     Status: None   Collection Time: 09/25/23  9:42 AM   Specimen: BLOOD  Result Value Ref Range Status   Specimen Description BLOOD RIGHT ARM  Final   Special Requests   Final    BOTTLES DRAWN AEROBIC AND ANAEROBIC Blood Culture adequate volume   Culture   Final    NO GROWTH 5 DAYS Performed at [facility], [address], [place], [place] [address]    Report Status 09/30/2023 FINAL  Final  Blood Culture (routine x 2)     Status: None   Collection Time: 09/25/23  9:56 AM   Specimen: BLOOD  Result Value Ref Range Status   Specimen Description BLOOD RIGHT HAND  Final   Special Requests   Final    BOTTLES DRAWN AEROBIC AND ANAEROBIC Blood Culture results may not be  optimal due to an inadequate volume of blood received in culture bottles   Culture   Final    NO GROWTH 5 DAYS Performed at [facility], [address] [place], [place] [address]    Report Status 09/30/2023 FINAL  Final  Resp panel by RT-PCR (RSV, Flu A&B, Covid) Anterior Nasal Swab     Status: None   Collection Time: 09/25/23 11:00 AM   Specimen: Anterior Nasal Swab  Result Value Ref Range Status   SARS Coronavirus 2 by RT PCR NEGATIVE NEGATIVE Final    Comment: (NOTE) SARS-CoV-2 target nucleic acids are NOT DETECTED.  The SARS-CoV-2 RNA is generally detectable in upper respiratory specimens during the acute phase of infection. The lowest concentration of SARS-CoV-2 viral copies this assay can detect is 138 copies/mL. A negative result does not preclude SARS-Cov-2 infection and should not be used as the sole basis for treatment or other patient management decisions. A negative result may occur with  improper specimen collection/handling, submission of specimen other than nasopharyngeal swab, presence of viral mutation(s) within the areas targeted by this assay, and inadequate number of viral copies(<138 copies/mL). A negative result must be combined with clinical observations, patient history, and epidemiological information. The expected result is Negative.  Fact Sheet for Patients:  BloggerCourse.com  Fact Sheet for Healthcare Providers:  SeriousBroker.it  This test is no t yet approved or cleared by the [place]  FDA and  has been authorized for detection and/or diagnosis of SARS-CoV-2 by FDA under an Emergency Use Authorization (EUA). This EUA will remain  in effect (meaning this test can be used) for the duration of the COVID-19 declaration under Section 564(b)(1) of the Act, 21 U.S.C.section 360bbb-3(b)(1), unless the authorization is terminated  or revoked sooner.       Influenza A by PCR NEGATIVE NEGATIVE Final    Influenza B by PCR NEGATIVE NEGATIVE Final    Comment: (NOTE) The Xpert Xpress SARS-CoV-2/FLU/RSV plus assay is intended as an aid in the diagnosis of influenza from Nasopharyngeal swab specimens and should not be used as a sole basis for treatment. Nasal washings and aspirates are unacceptable for Xpert Xpress SARS-CoV-2/FLU/RSV testing.  Fact Sheet for Patients: BloggerCourse.com  Fact Sheet for Healthcare Providers: SeriousBroker.it  This test is not yet approved or cleared by the [place]  FDA and has been authorized for detection and/or diagnosis of SARS-CoV-2 by FDA under an Emergency Use Authorization (EUA). This EUA will remain in effect (meaning this test can be used) for the duration of the COVID-19 declaration under Section 564(b)(1) of the Act, 21 U.S.C. section 360bbb-3(b)(1), unless the authorization is terminated or revoked.     Resp Syncytial Virus by PCR NEGATIVE NEGATIVE Final    Comment: (NOTE) Fact Sheet for Patients: BloggerCourse.com  Fact Sheet for Healthcare Providers: SeriousBroker.it  This test is not yet approved or cleared by the [place]  FDA and has been authorized for detection and/or diagnosis of SARS-CoV-2 by FDA under an Emergency Use Authorization (EUA). This EUA will remain in effect (meaning this test can be used) for the duration of the COVID-19 declaration under Section 564(b)(1) of the Act, 21 U.S.C. section 360bbb-3(b)(1), unless the authorization is terminated or revoked.  Performed at [facility], [address], [place], [place] [address]   MRSA  Next Gen by PCR, Nasal     Status: None   Collection Time: 09/25/23 12:42 PM   Specimen: Nasal Mucosa; Nasal Swab  Result Value Ref Range Status   MRSA by PCR Next Gen NOT DETECTED NOT DETECTED Final    Comment: (NOTE) The GeneXpert MRSA Assay (FDA approved for NASAL specimens  only), is one component of a comprehensive MRSA colonization surveillance program. It is not intended to diagnose MRSA infection nor to guide or monitor treatment for MRSA infections. Test performance is not FDA approved in patients less than 12 years old. Performed at [facility], [address] [place], [place] [address]   Fungus Culture With Stain     Status: None (Preliminary result)   Collection Time: 09/27/23  1:45 PM   Specimen: Pleural, Right  Result Value Ref Range Status   Fungus Stain Final report  Final    Comment: (NOTE) Performed At: [facility] [address] [place], [place] [id number] Pearlean Botts MD XB:[id number]    Fungus (Mycology) Culture PENDING  Incomplete   Fungal Source RIGHT  Final    Comment: PLEURAL Performed at [facility], [address] [place], [place] [address]   Gram stain     Status: None   Collection Time: 09/27/23  1:45 PM   Specimen: Pleura  Result Value Ref Range Status   Specimen Description PLEURAL  Final   Special Requests PLEURAL  Final   Gram Stain   Final    NO ORGANISMS SEEN WBC PRESENT, PREDOMINANTLY MONONUCLEAR CYTOSPIN SMEAR Performed at [facility], [address], [place], [place] [address]    Report Status 09/27/2023 FINAL  Final  Culture, body fluid w Gram Stain-bottle     Status: None (Preliminary result)   Collection Time: 09/27/23  1:45 PM   Specimen: Pleura  Result Value Ref Range Status   Specimen Description PLEURAL  Final   Special Requests   Final    BOTTLES DRAWN AEROBIC AND ANAEROBIC Blood Culture adequate volume   Culture   Final    NO GROWTH 4 DAYS Performed at [facility], [address] [place], [place] [address]    Report Status PENDING  Incomplete  Fungus Culture Result     Status: None   Collection Time: 09/27/23  1:45 PM  Result Value Ref Range Status   Result 1 Comment  Final    Comment: (NOTE) KOH/Calcofluor preparation:  no fungus observed. Performed At: [facility] [address] [place], [place] [id number] Pearlean Botts MD UU:[id number]     Labs: CBC: Recent Labs  Lab 09/25/23 [phone] 09/27/23 0456 09/29/23 0806 09/30/23 0247 09/30/23 1057 10/01/23 0500  WBC 8.6 3.3* 7.7 5.7  --  7.9  NEUTROABS 7.2  --   --   --   --   --   HGB 10.0* 8.1* 9.0* 7.7* 8.9* 10.1*  HCT 31.1* 25.2* 28.2* 25.2* 27.6* 30.7*  MCV 94.5 96.2 95.9 95.5  --  94.5  PLT 188 152 229 187  --  235   Basic Metabolic Panel: Recent Labs  Lab 09/26/23 0444 09/27/23 0354 09/29/23 0806 09/30/23 0247 10/01/23 0500  NA 127* 131* 132* 129* 131*  K 3.4* 4.7 3.5 3.7 3.7  CL 96* 101 94* 96* 98  CO2 23 23 22 23 23   GLUCOSE 137* 123* 89 100* 79  BUN 8 10 9 12 12   CREATININE 0.73 0.64 0.77 0.80 0.83  CALCIUM  7.8* 8.0* 8.1* 8.1* 8.3*  MG  --   --  1.5*  --   --   PHOS  --  3.9  --   --   --    Liver Function Tests: Recent Labs  Lab 09/25/23 0956 09/27/23 0354 09/29/23 0806 09/30/23 0247 10/01/23 0500  AST 37  --  38 38 36  ALT 17  --  35 39 41  ALKPHOS 99  --  84 81 78  BILITOT 1.9*  --  0.9 0.9 1.1  PROT 6.6  --  6.0* 5.9* 5.8*  ALBUMIN  2.5* 2.1* 2.5* 2.6* 2.6*   CBG: Recent Labs  Lab 09/27/23 0406  GLUCAP 118*    Discharge time spent: greater than 30 minutes.  Signed: Bobbetta Burnet, MD [facility] 10/01/2023

## 2023-10-01 NOTE — Plan of Care (Signed)
   Problem: Education: Goal: Knowledge of General Education information will improve Description Including pain rating scale, medication(s)/side effects and non-pharmacologic comfort measures Outcome: Progressing   Problem: Health Behavior/Discharge Planning: Goal: Ability to manage health-related needs will improve Outcome: Progressing

## 2023-10-02 ENCOUNTER — Other Ambulatory Visit: Payer: Self-pay | Admitting: Physician Assistant

## 2023-10-02 DIAGNOSIS — C3491 Malignant neoplasm of unspecified part of right bronchus or lung: Secondary | ICD-10-CM

## 2023-10-02 LAB — CULTURE, BODY FLUID W GRAM STAIN -BOTTLE
Culture: NO GROWTH
Special Requests: ADEQUATE

## 2023-10-02 LAB — CYTOLOGY - NON PAP

## 2023-10-03 ENCOUNTER — Telehealth: Payer: Self-pay

## 2023-10-03 ENCOUNTER — Other Ambulatory Visit: Payer: Self-pay | Admitting: Physician Assistant

## 2023-10-03 ENCOUNTER — Encounter: Payer: Self-pay | Admitting: Internal Medicine

## 2023-10-03 DIAGNOSIS — C3491 Malignant neoplasm of unspecified part of right bronchus or lung: Secondary | ICD-10-CM

## 2023-10-03 NOTE — Telephone Encounter (Signed)
 Spoke with patient's wife regarding patient's discharge from the hospital on 10/01/23. She stated the patient has a follow-up appointment scheduled with Cassie, PA on 10/10/23.  While the patient was hospitalized, the respiratory therapist recommended using the ear probe for oxygen  saturation checks instead of the finger probe, as it provides more accurate readings for him.  Patient's wife reported that his oxygen  saturation was 88% on 4L of oxygen  today. She also noted that he has been eating and drinking well. Encouraged her to have the patient get up and move around periodically, as tolerated. Advised her to call the office with any new or worsening symptoms. She voiced understanding.

## 2023-10-04 ENCOUNTER — Inpatient Hospital Stay

## 2023-10-05 ENCOUNTER — Inpatient Hospital Stay

## 2023-10-08 ENCOUNTER — Other Ambulatory Visit: Payer: Self-pay | Admitting: Internal Medicine

## 2023-10-08 NOTE — Progress Notes (Signed)
 [facility] OFFICE PROGRESS NOTE  [name], NP [address]  [address] [place] [address]  DIAGNOSIS: Stage IV (T3, N0, M1C) non-small cell lung cancer, adenocarcinoma.  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 He was diagnosed in July 2021.   Molecular Biomarkers:  MSI-High DETECTED Pembrolizumab  Atezolizumab, Avelumab, Cemiplimab, Dostarlimab, Durvalumab, Ipilimumab, Nivolumab   STK11Splice Site SNV 1.9% Everolimus, Temsirolimus Yes   KRASG12D 1.7% Binimetinib Yes   [id number] 0.4%   Niraparib, Olaparib, Rucaparib, Talazoparib, Tazemetostat Yes   PRIOR THERAPY: 1) SRS to the solitary brain metastasis under the care of Dr. Dewey. Last treatment 11/14/19. 2) Weekly concurrent chemoradiation with carboplatin  for an AUC of 2, paclitaxel  45 mg/m2.  First dose expected on 11/25/2019. Status post 7 cycles, last dose was given 01/06/2020 with partial response.  3)  Immunotherapy with Keytruda  200 mg IV every 3 weeks.  First dose February 10, 2020 for a patient with MSI high.  Status post 35  cycles. 4) Avastin  15 mg/KG every 3 weeks.  First dose today for the vasogenic edema of the brain.S/P 7 cycles. 5) The patient had evidence for local disease recurrence in July 2024. 6) SRS to the occipital brain lesion under the care of Dr. Dewey on 12/22/22 7)  UHRT under the care of Dr. Dewey, last dose expected on 03/03/23 8) SRS to the new metastatic brain lesion on 03/07/23 9) Resuming his treatment with Keytruda  200 Mg IV every 3 weeks. First dose 11/16/2022. Status post 10 cycles  10) SRS to the new metastatic brain lesions under the care of Dr. Dewey, completed on 06/21/2023  CURRENT THERAPY: Palliative systemic chemotherapy with carboplatin  for an AUC of 5, alimta 500 mg/m, and Keytruda . First dose on 06/28/23. He is status post 4 cycles. His dose of carboplatin  will be reduced  starting from cycle #4 to AUC of 4 and alimta 400 mg/m2 due to intolerance.   INTERVAL HISTORY: JAYSTON TREVINO 63 y.o. male returns to the clinic today for a follow-up visit accompanied by his wife.  The patient was last seen in the clinic on 09/06/23.  He had a restaging CT scan at that time that showed a possible pneumonia.  He was treated with antibiotics.   He had worsening respiratory failure and was recently hospitalized from 6/2-6/8. Imaging showed worsening pneumonia. He had increased oxygen  demand. He was discharged on 4 of supplemental oxygen . He also underwent a thoracentesis which obtained 700 ml of fluid. He completed antibiotics IV while hospitalized.  Discharged on a Medrol  Dosepak.  When he was discharged in the hospital, he states his breathing was pretty good.  He continues to have a lot of fatigue and generalized weakness.  He did have a fall the other night which they attributed to possibly tripping over his oxygen  tubing.  He had not had a cough for 8 days since being out of the hospital but started coughing again approximately 2 to 3 days ago.  This may have corresponded to when he completed his Medrol  Dosepak.  He coughs up a little bit of phlegm.  He also was not sure if his cough was provoked by the nicotine  pouches he is been using.   He denies any fever since being out of the hospital.  He has cold intolerance.  He has lost weight.  He denies any chest pain or hemoptysis.  He denies any nausea, vomiting, diarrhea,  or constipation.  He denies any headache or visual changes. He takes prilosec daily.  The patient's wife has a bad shoulder recently had shoulder surgery.  The oxygen  is heavy.  They are wondering if he would qualify for smaller portable oxygen  change.  He is here today for a more detailed discussion about his current condition and treatment options.    MEDICAL HISTORY: Past Medical History:  Diagnosis Date   Cancer (HCC)    lung cancer   Diverticulosis     GERD (gastroesophageal reflux disease)    Hx of small bowel obstruction    Hyperlipidemia    Hypertension    Hypothyroidism    IBS (irritable bowel syndrome)    Substance abuse (HCC)    Alcoholic, Drug addition   Thyroid  disease     ALLERGIES:  is allergic to penicillins.  MEDICATIONS:  Current Outpatient Medications  Medication Sig Dispense Refill   acetaminophen  (TYLENOL ) 500 MG tablet Take 1,000 mg by mouth every 6 (six) hours as needed for mild pain (pain score 1-3).     albuterol  (PROVENTIL  HFA;VENTOLIN  HFA) 108 (90 Base) MCG/ACT inhaler Inhale 2 puffs into the lungs every 6 (six) hours as needed for wheezing or shortness of breath. 1 Inhaler 0   carvedilol  (COREG ) 3.125 MG tablet Take 1 tablet (3.125 mg total) by mouth 2 (two) times daily with a meal. 30 tablet 1   [facility] ASPIRIN  LOW DOSE 81 MG tablet TAKE 1 TABLET (81 MG TOTAL) BY MOUTH DAILY. SWALLOW WHOLE. 90 tablet 3   DULoxetine  (CYMBALTA ) 20 MG capsule Take 1 capsule (20 mg total) by mouth 2 (two) times daily. (Patient taking differently: Take 40 mg by mouth daily.) 60 capsule 5   fluticasone  furoate-vilanterol (BREO ELLIPTA ) 100-25 MCG/INH AEPB Inhale 1 puff into the lungs daily. 30 each 3   folic acid  (FOLVITE ) 1 MG tablet TAKE 1 TABLET BY MOUTH EVERY DAY 90 tablet 0   hydrOXYzine  (ATARAX ) 25 MG tablet Take 25 mg by mouth daily.     ipratropium (ATROVENT ) 0.02 % nebulizer solution Take 2.5 mLs (0.5 mg total) by nebulization 3 (three) times daily. 75 mL 12   levalbuterol  (XOPENEX ) 0.63 MG/3ML nebulizer solution Take 3 mLs (0.63 mg total) by nebulization 3 (three) times daily. 3 mL 12   levothyroxine  (SYNTHROID ) 100 MCG tablet TAKE 1 TABLET BY MOUTH EVERY DAY 90 tablet 1   loratadine (CLARITIN) 10 MG tablet Take 10 mg by mouth daily.     LORazepam  (ATIVAN ) 1 MG tablet Take 1 tablet (1 mg total) by mouth once as needed for up to 1 dose for anxiety (MRI claustrophobia). 3 tablet 0   lumateperone  tosylate (CAPLYTA ) 10.5 MG capsule  Take 42 mg by mouth at bedtime.     methylPREDNISolone  (MEDROL  DOSEPAK) 4 MG TBPK tablet Medrol  Dosepak take as instructed 21 tablet 0   omeprazole  (PRILOSEC) 40 MG capsule Take 1 capsule (40 mg total) by mouth daily. 90 capsule 4   ondansetron  (ZOFRAN ) 4 MG tablet Take 1 tablet (4 mg total) by mouth every 8 (eight) hours as needed. (Patient taking differently: Take 4 mg by mouth every 8 (eight) hours as needed for nausea or vomiting.) 40 tablet 2   pembrolizumab  (KEYTRUDA ) 100 MG/4ML SOLN Inject 2 mg/kg into the vein every 21 ( twenty-one) days.     polyethylene glycol powder (MIRALAX ) powder Take 17 g by mouth daily. 255 g 11   prochlorperazine  (COMPAZINE ) 10 MG tablet Take 1 tablet (10 mg total) by mouth  every 6 (six) hours as needed. (Patient taking differently: Take 10 mg by mouth every 6 (six) hours as needed for nausea or vomiting.) 30 tablet 2   No current facility-administered medications for this visit.    SURGICAL HISTORY:  Past Surgical History:  Procedure Laterality Date   arm surgery Right    BRONCHIAL BRUSHINGS  10/24/2019   Procedure: BRONCHIAL BRUSHINGS;  Surgeon: Shelah Lamar RAMAN, MD;  Location: [facility] ENDOSCOPY;  Service: Cardiopulmonary;;  right lower lobe    BRONCHIAL BRUSHINGS  11/05/2019   Procedure: BRONCHIAL BRUSHINGS;  Surgeon: Shelah Lamar RAMAN, MD;  Location: [facility] ENDOSCOPY;  Service: Pulmonary;;   BRONCHIAL NEEDLE ASPIRATION BIOPSY  10/24/2019   Procedure: BRONCHIAL NEEDLE ASPIRATION BIOPSIES;  Surgeon: Shelah Lamar RAMAN, MD;  Location: [facility] ENDOSCOPY;  Service: Cardiopulmonary;;   BRONCHIAL NEEDLE ASPIRATION BIOPSY  11/05/2019   Procedure: BRONCHIAL NEEDLE ASPIRATION BIOPSIES;  Surgeon: Shelah Lamar RAMAN, MD;  Location: [facility] ENDOSCOPY;  Service: Pulmonary;;   ENDOBRONCHIAL ULTRASOUND N/A 10/24/2019   Procedure: ENDOBRONCHIAL ULTRASOUND;  Surgeon: Shelah Lamar RAMAN, MD;  Location: [facility] ENDOSCOPY;  Service: Cardiopulmonary;  Laterality: N/A;   FINGER SURGERY Right    Middle   IR IMAGING  GUIDED PORT INSERTION  11/19/2019   VIDEO BRONCHOSCOPY N/A 10/24/2019   Procedure: VIDEO BRONCHOSCOPY WITHOUT FLUORO;  Surgeon: Shelah Lamar RAMAN, MD;  Location: [facility] ENDOSCOPY;  Service: Cardiopulmonary;  Laterality: N/A;   VIDEO BRONCHOSCOPY WITH ENDOBRONCHIAL NAVIGATION N/A 11/05/2019   Procedure: VIDEO BRONCHOSCOPY WITH ENDOBRONCHIAL NAVIGATION;  Surgeon: Shelah Lamar RAMAN, MD;  Location: [facility] ENDOSCOPY;  Service: Pulmonary;  Laterality: N/A;    REVIEW OF SYSTEMS:   Review of Systems  Constitutional: Positive for fatigue, generalized weakness, decreased appetite, and weight loss.  Negative for chills and fever.  HENT: Negative for mouth sores, nosebleeds, sore throat and trouble swallowing.   Eyes: Negative for eye problems and icterus.  Respiratory: Positive for stable to improved shortness of breath and cough.  Negative for  hemoptysis and wheezing.   Cardiovascular: Negative for chest pain and leg swelling.  Gastrointestinal: Negative for abdominal pain, constipation, diarrhea, nausea and vomiting.  Genitourinary: Negative for bladder incontinence, difficulty urinating, dysuria, frequency and hematuria.   Musculoskeletal: Negative for back pain, gait problem, neck pain and neck stiffness.  Skin: Negative for itching and rash.  Neurological: Negative for dizziness, extremity weakness, gait problem, headaches, light-headedness and seizures.  Hematological: Negative for adenopathy. Does not bruise/bleed easily.  Psychiatric/Behavioral: Negative for confusion, depression and sleep disturbance. The patient is not nervous/anxious.     PHYSICAL EXAMINATION:  There were no vitals taken for this visit.  ECOG PERFORMANCE STATUS: 2-3  Physical Exam  Constitutional: Oriented to person, place, and time and chronically ill appearing male, and in no distress.  HENT:  Head: Normocephalic and atraumatic.  Mouth/Throat: Oropharynx is clear and moist. No oropharyngeal exudate.  Eyes: Conjunctivae are  normal. Right eye exhibits no discharge. Left eye exhibits no discharge. No scleral icterus.  Neck: Normal range of motion. Neck supple.  Cardiovascular: Normal rate, regular rhythm, normal heart sounds and intact distal pulses.   Pulmonary/Chest: Effort normal quiet breath sounds bilaterally. No respiratory distress. No wheezes. No rales.  Abdominal: Soft. Bowel sounds are normal. Exhibits no distension and no mass. There is no tenderness.  Musculoskeletal: Normal range of motion. Exhibits no edema.  Lymphadenopathy:    No cervical adenopathy.  Neurological: Alert and oriented to person, place, and time. Exhibits normal muscle tone. Gait normal. Coordination normal.  Skin: Skin is  warm and dry. No rash noted. Not diaphoretic. No erythema. No pallor.  Psychiatric: Mood, memory and judgment normal.  Vitals reviewed.  LABORATORY DATA: Lab Results  Component Value Date   WBC 7.9 10/01/2023   HGB 10.1 (L) 10/01/2023   HCT 30.7 (L) 10/01/2023   MCV 94.5 10/01/2023   PLT 235 10/01/2023      Chemistry      Component Value Date/Time   NA 131 (L) 10/01/2023 0500   NA 141 03/08/2017 1707   K 3.7 10/01/2023 0500   CL 98 10/01/2023 0500   CO2 23 10/01/2023 0500   BUN 12 10/01/2023 0500   BUN 12 03/08/2017 1707   CREATININE 0.83 10/01/2023 0500   CREATININE 0.95 09/21/2023 1446      Component Value Date/Time   CALCIUM  8.3 (L) 10/01/2023 0500   ALKPHOS 78 10/01/2023 0500   AST 36 10/01/2023 0500   AST 19 09/21/2023 1446   ALT 41 10/01/2023 0500   ALT 15 09/21/2023 1446   BILITOT 1.1 10/01/2023 0500   BILITOT 1.2 09/21/2023 1446       RADIOGRAPHIC STUDIES:  ECHOCARDIOGRAM COMPLETE Result Date: 09/30/2023    ECHOCARDIOGRAM REPORT   Patient Name:   ADONNIS SALCEDA Date of Exam: 09/30/2023 Medical Rec #:  [id number]         Height:       68.0 in Accession #:    [id number]        Weight:       158.7 lb Date of Birth:  18-Jan-1961         BSA:          1.853 m Patient Age:    62 years           BP:           106/76 mmHg Patient Gender: M                 HR:           53 bpm. Exam Location:  [facility] Procedure: 2D Echo, Cardiac Doppler and Color Doppler (Both Spectral and Color            Flow Doppler were utilized during procedure). Indications:    Chest Pain R07.9                 CHF-Acute Diastolic I50.31  History:        Patient has no prior history of Echocardiogram examinations.                 COPD; Risk Factors:Hypertension, Dyslipidemia and Former Smoker.  Sonographer:    Aida Pizza RCS Referring Phys: [id number] SEYED A SHAHMEHDI  Sonographer Comments: Image acquisition challenging due to respiratory motion. IMPRESSIONS  1. Left ventricular ejection fraction, by estimation, is 55 to 60%. The left ventricle has normal function. The left ventricle has no regional wall motion abnormalities. Left ventricular diastolic parameters were normal.  2. Right ventricular systolic function is normal. The right ventricular size is normal.  3. The mitral valve is abnormal. No evidence of mitral valve regurgitation. No evidence of mitral stenosis.  4. The aortic valve was not well visualized. There is mild calcification of the aortic valve. There is mild thickening of the aortic valve. Aortic valve regurgitation is trivial. Mild aortic valve stenosis.  5. The inferior vena cava is normal in size with greater than 50% respiratory variability, suggesting right atrial pressure of 3 mmHg. FINDINGS  Left Ventricle: Left ventricular  ejection fraction, by estimation, is 55 to 60%. The left ventricle has normal function. The left ventricle has no regional wall motion abnormalities. Strain was performed and the global longitudinal strain is indeterminate. The left ventricular internal cavity size was normal in size. There is no left ventricular hypertrophy. Left ventricular diastolic parameters were normal. Right Ventricle: The right ventricular size is normal. No increase in right ventricular wall thickness. Right  ventricular systolic function is normal. Left Atrium: Left atrial size was normal in size. Right Atrium: Right atrial size was normal in size. Pericardium: There is no evidence of pericardial effusion. Mitral Valve: The mitral valve is abnormal. There is mild thickening of the mitral valve leaflet(s). There is mild calcification of the mitral valve leaflet(s). No evidence of mitral valve regurgitation. No evidence of mitral valve stenosis. Tricuspid Valve: The tricuspid valve is normal in structure. Tricuspid valve regurgitation is not demonstrated. No evidence of tricuspid stenosis. Aortic Valve: The aortic valve was not well visualized. There is mild calcification of the aortic valve. There is mild thickening of the aortic valve. Aortic valve regurgitation is trivial. Mild aortic stenosis is present. Aortic valve mean gradient measures 10.0 mmHg. Aortic valve peak gradient measures 18.4 mmHg. Aortic valve area, by VTI measures 0.94 cm. Pulmonic Valve: The pulmonic valve was normal in structure. Pulmonic valve regurgitation is not visualized. No evidence of pulmonic stenosis. Aorta: The aortic root is normal in size and structure. Venous: The inferior vena cava is normal in size with greater than 50% respiratory variability, suggesting right atrial pressure of 3 mmHg. IAS/Shunts: No atrial level shunt detected by color flow Doppler. Additional Comments: 3D was performed not requiring image post processing on an independent workstation and was indeterminate.  LEFT VENTRICLE PLAX 2D LVIDd:         4.10 cm   Diastology LVIDs:         2.95 cm   LV e' medial:    7.29 cm/s LV PW:         0.95 cm   LV E/e' medial:  9.8 LV IVS:        0.90 cm   LV e' lateral:   7.29 cm/s LVOT diam:     1.60 cm   LV E/e' lateral: 9.8 LV SV:         36 LV SV Index:   20 LVOT Area:     2.01 cm  RIGHT VENTRICLE RV S prime:     9.14 cm/s TAPSE (M-mode): 1.6 cm LEFT ATRIUM           Index        RIGHT ATRIUM           Index LA diam:      3.35  cm 1.81 cm/m   RA Area:     12.20 cm LA Vol (A2C): 34.8 ml 18.78 ml/m  RA Volume:   25.30 ml  13.66 ml/m LA Vol (A4C): 33.7 ml 18.19 ml/m  AORTIC VALVE AV Area (Vmax):    0.82 cm AV Area (Vmean):   0.77 cm AV Area (VTI):     0.94 cm AV Vmax:           214.50 cm/s AV Vmean:          147.500 cm/s AV VTI:            0.384 m AV Peak Grad:      18.4 mmHg AV Mean Grad:      10.0 mmHg LVOT Vmax:  87.50 cm/s LVOT Vmean:        56.500 cm/s LVOT VTI:          0.180 m LVOT/AV VTI ratio: 0.47  AORTA Ao Root diam: 3.10 cm MITRAL VALVE MV Area (PHT): 4.26 cm    SHUNTS MV Decel Time: 178 msec    Systemic VTI:  0.18 m MV E velocity: 71.55 cm/s  Systemic Diam: 1.60 cm MV A velocity: 64.65 cm/s MV E/A ratio:  1.11 Maude Emmer MD Electronically signed by Maude Emmer MD Signature Date/Time: 09/30/2023/4:35:08 PM    Final    DG Chest 2 View Result Date: 09/29/2023 CLINICAL DATA:  Shortness of breath. EXAM: CHEST - 2 VIEW COMPARISON:  Same day. FINDINGS: The heart size and mediastinal contours are within normal limits. Right internal jugular Port-A-Cath is unchanged. Stable bilateral lung opacities are noted concerning for multifocal pneumonia. Small right pleural effusion is noted. The visualized skeletal structures are unremarkable. IMPRESSION: Stable bilateral lung opacities concerning for multifocal pneumonia with small right pleural effusion. Electronically Signed   By: Lynwood Landy Raddle M.D.   On: 09/29/2023 13:30   DG CHEST PORT 1 VIEW Result Date: 09/29/2023 CLINICAL DATA:  Shortness of breath EXAM: PORTABLE CHEST 1 VIEW COMPARISON:  September 27, 2023 FINDINGS: No change right IJ Infusaport catheter. Persistent unchanged bilateral basilar pulmonary infiltrates with consolidative changes of the left lower lobe pleural reaction of the right lower lobe. Previously seen lucency within the right lateral costophrenic sulcus is not seen on today's images. No evidence of pneumothorax. IMPRESSION: Persistent unchanged  bilateral basilar pulmonary infiltrates with consolidative changes of the left lower lobe pleural reaction of the right lower lobe. No significant change with persistent pulmonary infiltrates and right pleural effusion Electronically Signed   By: Franky Chard M.D.   On: 09/29/2023 08:22   DG Chest Port 1 View Result Date: 09/27/2023 CLINICAL DATA:  Post thoracentesis EXAM: PORTABLE CHEST 1 VIEW COMPARISON:  09/27/2023 FINDINGS: Decreasing right pleural effusion following thoracentesis. Probable pleural air laterally at the right lung base in the area of prior effusion suggesting stuck lung. Small residual right pleural effusion. Right Port-A-Cath is unchanged. Bilateral airspace opacities are unchanged. IMPRESSION: Decreasing right effusion following thoracentesis. Probable pleural air/pneumothorax laterally at the right lung base in the area of prior effusions suggesting stuck lung. Stable bilateral airspace disease. Electronically Signed   By: Franky Crease M.D.   On: 09/27/2023 14:16   US  THORACENTESIS ASP PLEURAL SPACE W/IMG GUIDE Result Date: 09/27/2023 INDICATION: 63 year old male. History of metastatic lung cancer. Request is for diagnostic and therapeutic thoracentesis EXAM: ULTRASOUND GUIDED RIGHT-SIDED THERAPEUTIC AND DIAGNOSTIC THORACENTESIS MEDICATIONS: Lidocaine  1% 10 mL COMPLICATIONS: None immediate. PROCEDURE: An ultrasound guided thoracentesis was thoroughly discussed with the patient and questions answered. The benefits, risks, alternatives and complications were also discussed. The patient understands and wishes to proceed with the procedure. Written consent was obtained. Ultrasound was performed to localize and mark an adequate pocket of fluid in the right chest. The area was then prepped and draped in the normal sterile fashion. 1% Lidocaine  was used for local anesthesia. Under ultrasound guidance a 8 Fr Safe-T-Centesis catheter was introduced. Thoracentesis was performed. The catheter was  removed and a dressing applied. FINDINGS: A total of approximately 700 mL of straw-colored fluid was removed. Samples were sent to the laboratory as requested by the clinical team. IMPRESSION: Successful ultrasound guided therapeutic and diagnostic right-sided thoracentesis yielding 700 mL of pleural fluid. Performed by Delon Beagle NP Electronically Signed  By: Ester Sides M.D.   On: 09/27/2023 14:05   DG CHEST PORT 1 VIEW Result Date: 09/27/2023 CLINICAL DATA:  [id number] Dyspnea and respiratory abnormalities [id number] EXAM: PORTABLE CHEST - 1 VIEW COMPARISON:  09/25/2023 FINDINGS: Patchy alveolar opacities involving bases more than apices, generally stable since previous. Moderate right pleural effusion appears slightly increased. Stable right IJ port catheter. Heart size normal. Visualized bones unremarkable. IMPRESSION: 1. Slight increase in moderate right pleural effusion. 2. Stable patchy alveolar opacities. Electronically Signed   By: JONETTA Faes M.D.   On: 09/27/2023 08:00   CT Angio Chest PE W and/or Wo Contrast Result Date: 09/25/2023 CLINICAL DATA:  Pulmonary embolism suspected EXAM: CT ANGIOGRAPHY CHEST WITH CONTRAST TECHNIQUE: Multidetector CT imaging of the chest was performed using the standard protocol during bolus administration of intravenous contrast. Multiplanar CT image reconstructions and MIPs were obtained to evaluate the vascular anatomy. RADIATION DOSE REDUCTION: This exam was performed according to the departmental dose-optimization program which includes automated exposure control, adjustment of the mA and/or kV according to patient size and/or use of iterative reconstruction technique. CONTRAST:  75mL OMNIPAQUE  IOHEXOL  350 MG/ML SOLN COMPARISON:  August 16, 2023 FINDINGS: Cardiovascular: Satisfactory opacification of the pulmonary arteries to the segmental level. No evidence of pulmonary embolism. Normal heart size. No pericardial effusion. Mediastinum/Nodes: No enlarged mediastinal,  hilar, or axillary lymph nodes. Thyroid  gland, trachea, and esophagus demonstrate no significant findings. Lungs/Pleura: Extensive bilateral pulmonary infiltrates which correlates with chronic interstitial lung disease with thickening of the interlobar interlobular septi with persistent right pleural effusion right lower lobe infiltrates and atelectasis of the inferior medial segment of the right lower lobe that could correlate with superimposed pneumonia. There is significant retraction of the right lower lobe pulmonary vessels that could correlate with trapped lung as well. No suspicious pulmonary nodules. Upper Abdomen: No acute abnormality. Musculoskeletal: No chest wall abnormality. No acute or significant osseous findings. Review of the MIP images confirms the above findings. IMPRESSION: *No evidence of pulmonary embolism. *Extensive bilateral pulmonary infiltrates which correlates with chronic interstitial lung disease with thickening of the interlobar interlobular septi with persistent right pleural effusion right lower lobe infiltrates and atelectasis of the inferior medial segment of the right lower lobe that could correlate with superimposed pneumonia. There is significant retraction of the right lower lobe pulmonary vessels that could correlate with trapped lung as well. Electronically Signed   By: Franky Chard M.D.   On: 09/25/2023 12:56   DG Chest Port 1 View Result Date: 09/25/2023 CLINICAL DATA:  Questionable sepsis EXAM: PORTABLE CHEST 1 VIEW COMPARISON:  Sep 22, 2023 FINDINGS: Persistent unchanged bilateral basilar infiltrates atelectasis and right pleural effusion and reaction. No change in the right IJ Infusaport catheter The heart normal size IMPRESSION: Persistent unchanged bilateral basilar infiltrates atelectasis and right pleural effusion and reaction. Electronically Signed   By: Franky Chard M.D.   On: 09/25/2023 10:34   DG Chest 2 View Result Date: 09/22/2023 CLINICAL DATA:   Suspected pneumonia - cx pt EXAM: CHEST - 2 VIEW COMPARISON:  August 16, 2023 FINDINGS: Right chest port in place terminating at the cavoatrial junction. Unchanged small to moderate right pleural effusion with right basilar airspace opacities. New airspace opacities in the left lung base. No left pleural effusion or pneumothorax. No cardiomegaly. No acute fracture or destructive lesion. IMPRESSION: 1. New patchy airspace opacities in the left lung base, worrisome for a developing pneumonia. No left-sided pleural effusion. 2. Unchanged small to moderate right pleural effusion with  similar right basilar airspace disease. Electronically Signed   By: Rogelia Myers M.D.   On: 09/22/2023 12:26   MR BRAIN W WO CONTRAST Result Date: 09/12/2023 CLINICAL DATA:  Initial evaluation for brain/CNS neoplasm, assess treatment response. EXAM: MRI HEAD WITHOUT AND WITH CONTRAST TECHNIQUE: Multiplanar, multiecho pulse sequences of the brain and surrounding structures were obtained without and with intravenous contrast. CONTRAST:  8.5 mL of Vueway  COMPARISON:  Prior MRI from 06/01/2023 FINDINGS: Brain: Age-related cerebral atrophy with chronic microvascular ischemic disease. No acute or subacute infarct. Stable lesions: 1. Metastatic lesion involving the inferior left cerebellum is relatively stable in size measuring 2.8 x 2.0 x 1.9 cm. Surrounding edema within the adjacent left cerebellar hemisphere with extension into the left middle cerebellar peduncle, similar. Associated chronic blood products again noted. 2. Punctate lesion involving the left cerebellar vermis, similar (series 14, image 65), stable. 3. 7 mm cortically based lesion at the posterior right frontal lobe (series 14, image 133). 4. 8 mm lesion at the high posterior left parietal lobe (series 14, image 148). Smaller and/or resolved lesions: 1. 5 mm lesion at the left occipital lobe (series 14, image 95). 2. Previously seen punctate right occipital lesion is no  longer visualized (series 13, image 58 on prior exam). 3. Subcentimeter posterior right temporal lesion no longer seen (series 13, image 70 on prior exam). Larger lesions: 1. 7 mm lesion at the left occipital lobe (series 14, image 101). 2. 10 mm lesion at the left occipital lobe (series 14, image 94). 3. 5 mm lesion at the right occipital lobe (series 14, image 85). 4. Multiple lesions clustered about the posterior falx at the bilateral occipital regions, all of which appear slightly increased in size from prior (series 14, image 80). Largest of these lesions measures up to 1.1 cm at the right occipital lobe. 5. Previously seen 2 adjacent lesions now appears as 1 lesion measuring up to 1 cm at the right temporal lobe (series 14, image 81). 6. 4 mm lesion at the anterior left frontal lobe (series 14, image 107). No definite new lesions on today's exam. Vascular: Major intravascular flow voids are maintained. Skull and upper cervical spine: Craniocervical junction within normal limits. Bone marrow signal intensity within normal limits. No scalp soft tissue abnormality. Sinuses/Orbits: Globes and orbital soft tissues within normal limits. Mucosal thickening with air-fluid level noted within the right maxillary sinus. Trace left mastoid effusion, of doubtful significance. Other: None. IMPRESSION: 1. Multiple intracranial metastases as above. Overall, these lesions are for the most part either stable or slightly increased in size as compared to prior MRI from 06/01/2023. No definite new lesions on today's exam. 2. Underlying age-related cerebral atrophy with chronic microvascular ischemic disease. 3. Mucosal thickening with air-fluid level within the right maxillary sinus. Clinical correlation for possible acute sinusitis recommended. Electronically Signed   By: Morene Hoard M.D.   On: 09/12/2023 04:23     ASSESSMENT/PLAN:  This is a very pleasant 63 year old Caucasian male diagnosed with stage IV non-small  cell lung cancer, adenocarcinoma.  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 He was diagnosed in July 2021. His molecular studies by [facility] show he has MSI high which is a good marker for response to immunotherapy which will be important for future treatment.     The patient will complete SRS to the solidary brain metastasis under the care of Dr. Dewey later today on 11/14/19.  He then underwent a course of weekly concurrent chemoradiation with carboplatin  for an AUC of 2 and paclitaxel  45 mg per metered squared.  He tolerated this treatment well except for fatigue and mild odynophagia.    The patient showed evidence of disease progression with an increase in volume of the right infrahilar mass.  Since the patient has MSI high, the patient then underwent treatment with single agent immunotherapy with Keytruda  200 mg IV every 3 weeks.   The patient is status post 35 cycles and completed his 2 years of treatment on 03/23/22.  He also received 7 cycles of Avastin  for vasogenic edema in the brain.  Avastin  has been on hold unless needed again in the future.     He has SRS to an occipital brain lesion on 12/22/22 by Dr. Dewey   He had evidence of progression on his surveillance imaging from July 2024. Therefore, he resumed immunotherapy with Keytruda . He is status post 9 cycles.    He underwent UHRT in the last day was on 03/03/2023 for some disease progression that was seen at that time. He is also was found to have new metastatic disease to the brain and had SRS on 03/07/23.   Unfortunately, the patient recently had continued CNS progression.  He is undergoing SRS under the care of Dr. Dewey in the last day was earlier today on 06/21/2023.   In light of the progression in the brain, Dr. Sherrod and the patient are in agreement to try to add on systemic chemotherapy with carboplatin  for an AUC of 5, Alimta 500 mg/m,  and Keytruda  200 mg IV every 3 weeks to see if it helps with his progression in the brain.  Status post 4 cycles.  He has had some cytopenias requiring supportive care.   The patient has had some trouble with weakness and cytopenias since undergoing chemotherapy. Due to concerns with quality of life, his dose of chemotherapy with second before was reduced to carboplatin  for AUC of 4 and Alimta 400 mg/m.  He was recently hospitalized from 6/2 to 10/01/2023 due to pneumonia and respiratory failure.   The patient was seen with Dr. Sherrod today.  Reviewed reviewed his most recent hospitalization.  Dr. Sherrod reviewed the scan.  He is concerned this could be potentially pneumonitis secondary to Keytruda .  Dr. Sherrod recommends high-dose prednisone  taper.  He will start taking 70 mg p.o. daily for 1 week followed by 50 mg for 1 week followed by 30 mg for 1 week followed by 20 mg daily for 1 week followed by 10 mg.  We will hold the patient's treatment while he is undergoing his prednisone  taper.  We will see the patient back in 5 weeks with a restaging CT scan of the chest, abdomen, and pelvis 1 week prior to his follow-up appointment.  Will need to resume his treatment, we will start him on single agent Alimta and discontinue his Keytruda .  Since he has some hypotension today, we will arrange for half liter to 1 L of IV fluids today.  He was advised to monitor his blood pressure closely at home.  He will check with the oxygen  supply company to see if he qualifies for a smaller portable oxygen  tank.  He will continue taking Prilosec while on steroids.  The patient was advised to call immediately if he has any concerning symptoms in the interval. The patient voices understanding of current disease status and treatment options and is in agreement with the current care  plan. All questions were answered. The patient knows to call the clinic with any problems, questions or concerns. We can certainly see  the patient much sooner if necessary   No orders of the defined types were placed in this encounter.    Branston Halsted L Jaizon Deroos, PA-C 10/08/23  ADDENDUM: Hematology/Oncology Attending: I had a face-to-face encounter with the patient today.  I reviewed his records, lab, scan and recommended his care plan.  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  Molecular studies showed MSI high but no other actionable mutations.  The patient started treatment with SRS to brain metastasis followed by a course of concurrent chemoradiation with locally advanced disease in the lung followed by 2 years treatment initially with Keytruda  every 3 weeks.  He was on observation until July 2024 when he had evidence for disease recurrence and he resumed his treatment with single agent Keytruda  but he has evidence for disease progression in March 2025 and we added systemic chemotherapy with carboplatin  and Alimta every 3 weeks in addition to Keytruda  for 4 cycles.  The patient was [facility] with suspicious pneumonia and significant shortness of chest and respiratory failure.  He was treated with a course of antibiotics and he is also on home oxygen  4 L/min.  He also underwent right-sided thoracentesis with drainage of 700 mL of fluid. The patient is here today for evaluation before resuming his therapy. He continues to have generalized weakness and fatigue and a fall last night.  He continues to have cough with small amount of sputum.  He completed a recent Medrol  Dosepak. I personally and independently reviewed the recent CT scan images and discussed the result with the patient.  Unfortunately patient has clear evidence for inflammatory process likely could be related to  immunotherapy mediated pneumonitis. I recommended for him to delay his treatment with maintenance therapy for now.  Will start the patient on a tapered dose of prednisone  for the next 5 weeks. We will arrange repeat CT scan of the chest in 5 weeks for evaluation of his condition before resuming his treatment which likely to be with just single agent Alimta.  We will hold his treatment with Keytruda  for now. The patient and his wife are in agreement with the current plan. He was advised to call immediately if he has any other concerning symptoms in the interval. The total time spent in the appointment was 30 minutes including review of chart and various tests results, discussions about plan of care and coordination of care plan . Disclaimer: This note was dictated with voice recognition software. Similar sounding words can inadvertently be transcribed and may be missed upon review. Sherrod MARLA Sherrod, MD

## 2023-10-10 ENCOUNTER — Inpatient Hospital Stay

## 2023-10-10 ENCOUNTER — Other Ambulatory Visit: Payer: Self-pay

## 2023-10-10 ENCOUNTER — Inpatient Hospital Stay: Attending: Internal Medicine

## 2023-10-10 ENCOUNTER — Inpatient Hospital Stay (HOSPITAL_BASED_OUTPATIENT_CLINIC_OR_DEPARTMENT_OTHER): Admitting: Physician Assistant

## 2023-10-10 VITALS — BP 96/64 | HR 101 | Temp 98.2°F | Resp 20

## 2023-10-10 VITALS — BP 94/62 | HR 84 | Temp 97.6°F | Wt 154.0 lb

## 2023-10-10 DIAGNOSIS — C7931 Secondary malignant neoplasm of brain: Secondary | ICD-10-CM | POA: Insufficient documentation

## 2023-10-10 DIAGNOSIS — C3491 Malignant neoplasm of unspecified part of right bronchus or lung: Secondary | ICD-10-CM

## 2023-10-10 DIAGNOSIS — Z923 Personal history of irradiation: Secondary | ICD-10-CM | POA: Diagnosis not present

## 2023-10-10 DIAGNOSIS — Z95828 Presence of other vascular implants and grafts: Secondary | ICD-10-CM

## 2023-10-10 DIAGNOSIS — G936 Cerebral edema: Secondary | ICD-10-CM | POA: Diagnosis not present

## 2023-10-10 DIAGNOSIS — I959 Hypotension, unspecified: Secondary | ICD-10-CM

## 2023-10-10 DIAGNOSIS — C3431 Malignant neoplasm of lower lobe, right bronchus or lung: Secondary | ICD-10-CM | POA: Insufficient documentation

## 2023-10-10 LAB — CBC WITH DIFFERENTIAL (CANCER CENTER ONLY)
Abs Immature Granulocytes: 0.19 10*3/uL — ABNORMAL HIGH (ref 0.00–0.07)
Basophils Absolute: 0.1 10*3/uL (ref 0.0–0.1)
Basophils Relative: 1 %
Eosinophils Absolute: 0.1 10*3/uL (ref 0.0–0.5)
Eosinophils Relative: 1 %
HCT: 30.3 % — ABNORMAL LOW (ref 39.0–52.0)
Hemoglobin: 10 g/dL — ABNORMAL LOW (ref 13.0–17.0)
Immature Granulocytes: 2 %
Lymphocytes Relative: 6 %
Lymphs Abs: 0.6 10*3/uL — ABNORMAL LOW (ref 0.7–4.0)
MCH: 30.4 pg (ref 26.0–34.0)
MCHC: 33 g/dL (ref 30.0–36.0)
MCV: 92.1 fL (ref 80.0–100.0)
Monocytes Absolute: 1.2 10*3/uL — ABNORMAL HIGH (ref 0.1–1.0)
Monocytes Relative: 11 %
Neutro Abs: 8.6 10*3/uL — ABNORMAL HIGH (ref 1.7–7.7)
Neutrophils Relative %: 79 %
Platelet Count: 224 10*3/uL (ref 150–400)
RBC: 3.29 MIL/uL — ABNORMAL LOW (ref 4.22–5.81)
RDW: 16.1 % — ABNORMAL HIGH (ref 11.5–15.5)
WBC Count: 10.7 10*3/uL — ABNORMAL HIGH (ref 4.0–10.5)
nRBC: 0 % (ref 0.0–0.2)

## 2023-10-10 LAB — CMP (CANCER CENTER ONLY)
ALT: 15 U/L (ref 0–44)
AST: 18 U/L (ref 15–41)
Albumin: 3.2 g/dL — ABNORMAL LOW (ref 3.5–5.0)
Alkaline Phosphatase: 76 U/L (ref 38–126)
Anion gap: 7 (ref 5–15)
BUN: 8 mg/dL (ref 8–23)
CO2: 27 mmol/L (ref 22–32)
Calcium: 8.2 mg/dL — ABNORMAL LOW (ref 8.9–10.3)
Chloride: 95 mmol/L — ABNORMAL LOW (ref 98–111)
Creatinine: 0.73 mg/dL (ref 0.61–1.24)
GFR, Estimated: >60 mL/min (ref >60)
Glucose, Bld: 126 mg/dL — ABNORMAL HIGH (ref 70–99)
Potassium: 4 mmol/L (ref 3.5–5.1)
Sodium: 129 mmol/L — ABNORMAL LOW (ref 135–145)
Total Bilirubin: 1.2 mg/dL (ref 0.0–1.2)
Total Protein: 6.1 g/dL — ABNORMAL LOW (ref 6.5–8.1)

## 2023-10-10 LAB — SAMPLE TO BLOOD BANK

## 2023-10-10 MED ORDER — SODIUM CHLORIDE 0.9% FLUSH
10.0000 mL | INTRAVENOUS | Status: DC | PRN
  Administered 2023-10-10: 10 mL

## 2023-10-10 MED ORDER — HEPARIN SOD (PORK) LOCK FLUSH 100 UNIT/ML IV SOLN
500.0000 [IU] | Freq: Once | INTRAVENOUS | Status: AC | PRN
  Administered 2023-10-10: 500 [IU]

## 2023-10-10 MED ORDER — SODIUM CHLORIDE 0.9% FLUSH
10.0000 mL | Freq: Once | INTRAVENOUS | Status: AC
  Administered 2023-10-10: 10 mL

## 2023-10-10 MED ORDER — SODIUM CHLORIDE 0.9 % IV SOLN: Freq: Once | INTRAVENOUS | Status: AC

## 2023-10-10 MED ORDER — PREDNISONE 10 MG PO TABS: ORAL_TABLET | ORAL | 0 refills | Status: DC

## 2023-10-10 NOTE — Patient Instructions (Signed)

## 2023-10-10 NOTE — Progress Notes (Signed)
 Patient currently receives home oxygen  through [facility] and is requesting a portable oxygen  tank for use during outpatient appointments.  Spoke with Jessica in the C-Pap department at [facility], who provided the main fax number for the company. Order was completed and successfully faxed to [phone] with confirmation received.

## 2023-10-11 ENCOUNTER — Ambulatory Visit: Admitting: Internal Medicine

## 2023-10-11 ENCOUNTER — Other Ambulatory Visit

## 2023-10-11 ENCOUNTER — Ambulatory Visit

## 2023-10-12 ENCOUNTER — Inpatient Hospital Stay

## 2023-10-13 ENCOUNTER — Other Ambulatory Visit: Payer: Self-pay

## 2023-10-14 ENCOUNTER — Other Ambulatory Visit: Payer: Self-pay

## 2023-10-16 NOTE — Telephone Encounter (Signed)
 Hospitalist switched medications.

## 2023-10-18 ENCOUNTER — Telehealth: Payer: Self-pay | Admitting: Medical Oncology

## 2023-10-18 NOTE — Telephone Encounter (Signed)
 Oxygen  f/u-Spoke to [facility] . Pt has not received [employer] oxygen  requested nurse on 10/10/23.Order sent to [employer]  recently.

## 2023-10-19 ENCOUNTER — Ambulatory Visit: Admitting: Physician Assistant

## 2023-10-19 ENCOUNTER — Encounter: Admitting: Dietician

## 2023-10-19 ENCOUNTER — Ambulatory Visit

## 2023-10-19 ENCOUNTER — Other Ambulatory Visit

## 2023-10-20 ENCOUNTER — Telehealth: Payer: Self-pay | Admitting: Medical Oncology

## 2023-10-20 NOTE — Telephone Encounter (Signed)
 Patient asking about his portable oxygen  conserving device to wear on his shoulder.  I called [employer] and faxed the written order  Please evaluate patient for POC on 1-6 pulse dose. If patient qualifies please dispense. Received successful fax confirmation. [employer] will get message to [employer].   Pt notified of process necessary to qualify and given number to [employer].

## 2023-10-24 ENCOUNTER — Telehealth: Payer: Self-pay

## 2023-10-24 ENCOUNTER — Other Ambulatory Visit: Payer: Self-pay | Admitting: Physician Assistant

## 2023-10-24 DIAGNOSIS — C3491 Malignant neoplasm of unspecified part of right bronchus or lung: Secondary | ICD-10-CM

## 2023-10-24 DIAGNOSIS — J3489 Other specified disorders of nose and nasal sinuses: Secondary | ICD-10-CM

## 2023-10-24 MED ORDER — MUPIROCIN 2 % EX OINT: 1.0000 | TOPICAL_OINTMENT | Freq: Two times a day (BID) | CUTANEOUS | 0 refills | Status: DC

## 2023-10-24 NOTE — Telephone Encounter (Signed)
 Spoke with patient this morning regarding a sore on the left side of his nostril caused by oxygen  tubing, which he reports has become crusty on the inside. Patient stated he has been using Neosporin and peroxide in an attempt to treat the area. Informed patient that the peroxide could be contributing to the crustiness and is not recommended for ongoing use. Patient shared that his sister, who is a pharmacist, recommended Mupirocin ointment to aid healing.   Informed Cassie, PA, who agreed to send in a prescription for Mupirocin and suggested using medical fabric tape around the nostril to create a barrier if the tubing is the source of irritation. She also recommended contacting the oxygen  supply company to determine if the air should be humidified. Patient reported that he is scheduled to go to [facility] on Monday for an oxygen  test for a portable tank and will ask then. Patient verbalized understanding.

## 2023-10-26 ENCOUNTER — Inpatient Hospital Stay

## 2023-10-26 LAB — FUNGUS CULTURE RESULT

## 2023-10-26 LAB — FUNGUS CULTURE WITH STAIN

## 2023-10-26 LAB — FUNGAL ORGANISM REFLEX

## 2023-10-29 ENCOUNTER — Other Ambulatory Visit: Payer: Self-pay | Admitting: Physician Assistant

## 2023-10-29 DIAGNOSIS — J3489 Other specified disorders of nose and nasal sinuses: Secondary | ICD-10-CM

## 2023-11-01 ENCOUNTER — Ambulatory Visit: Admitting: Physician Assistant

## 2023-11-01 ENCOUNTER — Ambulatory Visit: Admitting: Internal Medicine

## 2023-11-01 ENCOUNTER — Encounter: Admitting: Dietician

## 2023-11-01 ENCOUNTER — Ambulatory Visit

## 2023-11-01 ENCOUNTER — Other Ambulatory Visit

## 2023-11-02 ENCOUNTER — Inpatient Hospital Stay

## 2023-11-08 ENCOUNTER — Ambulatory Visit

## 2023-11-08 ENCOUNTER — Ambulatory Visit: Admitting: Physician Assistant

## 2023-11-08 ENCOUNTER — Other Ambulatory Visit

## 2023-11-09 ENCOUNTER — Encounter: Payer: Self-pay | Admitting: Internal Medicine

## 2023-11-09 ENCOUNTER — Other Ambulatory Visit

## 2023-11-09 ENCOUNTER — Ambulatory Visit (HOSPITAL_COMMUNITY)
Admission: RE | Admit: 2023-11-09 | Discharge: 2023-11-09 | Disposition: A | Discharge status: Home / Self Care | Source: Ambulatory Visit | Attending: Physician Assistant | Admitting: Physician Assistant

## 2023-11-09 ENCOUNTER — Ambulatory Visit

## 2023-11-09 ENCOUNTER — Ambulatory Visit: Admitting: Internal Medicine

## 2023-11-09 DIAGNOSIS — C3491 Malignant neoplasm of unspecified part of right bronchus or lung: Secondary | ICD-10-CM | POA: Insufficient documentation

## 2023-11-09 MED ORDER — IOHEXOL 9 MG/ML PO SOLN
1000.0000 mL | ORAL | Status: AC
  Administered 2023-11-09: 1000 mL via ORAL

## 2023-11-09 MED ORDER — IOHEXOL 300 MG/ML  SOLN
100.0000 mL | Freq: Once | INTRAMUSCULAR | Status: AC | PRN
  Administered 2023-11-09: 100 mL via INTRAVENOUS

## 2023-11-09 MED ORDER — IOHEXOL 9 MG/ML PO SOLN
ORAL | Status: AC
  Filled 2023-11-09: qty 1000

## 2023-11-09 MED ORDER — HEPARIN SOD (PORK) LOCK FLUSH 100 UNIT/ML IV SOLN
500.0000 [IU] | Freq: Once | INTRAVENOUS | Status: AC
  Administered 2023-11-09: 500 [IU] via INTRAVENOUS

## 2023-11-10 ENCOUNTER — Encounter: Payer: Self-pay | Admitting: Physician Assistant

## 2023-11-10 ENCOUNTER — Encounter: Payer: Self-pay | Admitting: Internal Medicine

## 2023-11-11 ENCOUNTER — Other Ambulatory Visit: Payer: Self-pay | Admitting: Physician Assistant

## 2023-11-11 DIAGNOSIS — C3491 Malignant neoplasm of unspecified part of right bronchus or lung: Secondary | ICD-10-CM

## 2023-11-13 ENCOUNTER — Other Ambulatory Visit: Payer: Self-pay

## 2023-11-13 NOTE — Telephone Encounter (Signed)
 Completed course

## 2023-11-16 ENCOUNTER — Inpatient Hospital Stay

## 2023-11-16 ENCOUNTER — Inpatient Hospital Stay (HOSPITAL_BASED_OUTPATIENT_CLINIC_OR_DEPARTMENT_OTHER): Admitting: Internal Medicine

## 2023-11-16 ENCOUNTER — Inpatient Hospital Stay: Attending: Internal Medicine

## 2023-11-16 VITALS — HR 109

## 2023-11-16 VITALS — BP 102/89 | HR 117 | Temp 97.8°F | Resp 17 | Ht 68.0 in | Wt 159.0 lb

## 2023-11-16 DIAGNOSIS — Z5111 Encounter for antineoplastic chemotherapy: Secondary | ICD-10-CM | POA: Insufficient documentation

## 2023-11-16 DIAGNOSIS — C7931 Secondary malignant neoplasm of brain: Secondary | ICD-10-CM | POA: Diagnosis present

## 2023-11-16 DIAGNOSIS — C3431 Malignant neoplasm of lower lobe, right bronchus or lung: Secondary | ICD-10-CM | POA: Insufficient documentation

## 2023-11-16 DIAGNOSIS — C3491 Malignant neoplasm of unspecified part of right bronchus or lung: Secondary | ICD-10-CM

## 2023-11-16 DIAGNOSIS — Z79899 Other long term (current) drug therapy: Secondary | ICD-10-CM | POA: Insufficient documentation

## 2023-11-16 DIAGNOSIS — Z87891 Personal history of nicotine dependence: Secondary | ICD-10-CM | POA: Insufficient documentation

## 2023-11-16 DIAGNOSIS — Z95828 Presence of other vascular implants and grafts: Secondary | ICD-10-CM

## 2023-11-16 LAB — CMP (CANCER CENTER ONLY)
ALT: 9 U/L (ref 0–44)
AST: 9 U/L — ABNORMAL LOW (ref 15–41)
Albumin: 3.3 g/dL — ABNORMAL LOW (ref 3.5–5.0)
Alkaline Phosphatase: 94 U/L (ref 38–126)
Anion gap: 8 (ref 5–15)
BUN: 6 mg/dL — ABNORMAL LOW (ref 8–23)
CO2: 26 mmol/L (ref 22–32)
Calcium: 8.3 mg/dL — ABNORMAL LOW (ref 8.9–10.3)
Chloride: 97 mmol/L — ABNORMAL LOW (ref 98–111)
Creatinine: 0.78 mg/dL (ref 0.61–1.24)
GFR, Estimated: >60 mL/min (ref >60)
Glucose, Bld: 102 mg/dL — ABNORMAL HIGH (ref 70–99)
Potassium: 3.9 mmol/L (ref 3.5–5.1)
Sodium: 131 mmol/L — ABNORMAL LOW (ref 135–145)
Total Bilirubin: 0.8 mg/dL (ref 0.0–1.2)
Total Protein: 6.4 g/dL — ABNORMAL LOW (ref 6.5–8.1)

## 2023-11-16 LAB — SAMPLE TO BLOOD BANK

## 2023-11-16 LAB — CBC WITH DIFFERENTIAL (CANCER CENTER ONLY)
Abs Immature Granulocytes: 0.15 K/uL — ABNORMAL HIGH (ref 0.00–0.07)
Basophils Absolute: 0.1 K/uL (ref 0.0–0.1)
Basophils Relative: 0 %
Eosinophils Absolute: 0.1 K/uL (ref 0.0–0.5)
Eosinophils Relative: 1 %
HCT: 29.2 % — ABNORMAL LOW (ref 39.0–52.0)
Hemoglobin: 9.3 g/dL — ABNORMAL LOW (ref 13.0–17.0)
Immature Granulocytes: 1 %
Lymphocytes Relative: 11 %
Lymphs Abs: 1.5 K/uL (ref 0.7–4.0)
MCH: 30.6 pg (ref 26.0–34.0)
MCHC: 31.8 g/dL (ref 30.0–36.0)
MCV: 96.1 fL (ref 80.0–100.0)
Monocytes Absolute: 1.2 K/uL — ABNORMAL HIGH (ref 0.1–1.0)
Monocytes Relative: 9 %
Neutro Abs: 10.3 K/uL — ABNORMAL HIGH (ref 1.7–7.7)
Neutrophils Relative %: 78 %
Platelet Count: 281 K/uL (ref 150–400)
RBC: 3.04 MIL/uL — ABNORMAL LOW (ref 4.22–5.81)
RDW: 16.4 % — ABNORMAL HIGH (ref 11.5–15.5)
WBC Count: 13.3 K/uL — ABNORMAL HIGH (ref 4.0–10.5)
nRBC: 0 % (ref 0.0–0.2)

## 2023-11-16 MED ORDER — PROCHLORPERAZINE MALEATE 10 MG PO TABS
10.0000 mg | ORAL_TABLET | Freq: Once | ORAL | Status: AC
  Administered 2023-11-16: 10 mg via ORAL
  Filled 2023-11-16: qty 1

## 2023-11-16 MED ORDER — SODIUM CHLORIDE 0.9% FLUSH
10.0000 mL | INTRAVENOUS | Status: DC | PRN
  Administered 2023-11-16: 10 mL

## 2023-11-16 MED ORDER — SODIUM CHLORIDE 0.9 % IV SOLN
INTRAVENOUS | Status: DC
Start: 2023-11-16 — End: 2023-11-16

## 2023-11-16 MED ORDER — SODIUM CHLORIDE 0.9 % IV SOLN
400.0000 mg/m2 | Freq: Once | INTRAVENOUS | Status: AC
  Administered 2023-11-16: 800 mg via INTRAVENOUS
  Filled 2023-11-16: qty 20

## 2023-11-16 MED ORDER — HEPARIN SOD (PORK) LOCK FLUSH 100 UNIT/ML IV SOLN
500.0000 [IU] | Freq: Once | INTRAVENOUS | Status: AC | PRN
Start: 2023-11-16 — End: 2023-11-16
  Administered 2023-11-16: 500 [IU]

## 2023-11-16 MED ORDER — SODIUM CHLORIDE 0.9% FLUSH
10.0000 mL | Freq: Once | INTRAVENOUS | Status: AC
Start: 2023-11-16 — End: 2023-11-16
  Administered 2023-11-16: 10 mL

## 2023-11-16 MED ORDER — HYDROCODONE BIT-HOMATROP MBR 5-1.5 MG/5ML PO SOLN: 5.0000 mL | Freq: Four times a day (QID) | ORAL | 0 refills | Status: DC | PRN

## 2023-11-16 NOTE — Progress Notes (Signed)
 [name] [phone]   [phone]  OFFICE PROGRESS NOTE  [name], NP [address]  [address] [place] [address]  DIAGNOSIS: Stage IV (T3, N0, M1C) non-small cell lung cancer, adenocarcinoma.  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 He was diagnosed in July 2021.   Molecular Biomarkers:  MSI-High DETECTED Pembrolizumab  Atezolizumab, Avelumab, Cemiplimab, Dostarlimab, Durvalumab, Ipilimumab, Nivolumab   STK11Splice Site SNV 1.9% Everolimus, Temsirolimus Yes   [id number] 1.7% Binimetinib Yes   [id number] 0.4%   Niraparib, Olaparib, Rucaparib, Talazoparib, Tazemetostat Yes   PRIOR THERAPY:  1) SRS to the solitary brain metastasis under the care of Dr. Dewey. Last treatment 11/14/19. 2) Weekly concurrent chemoradiation with carboplatin  for an AUC of 2, paclitaxel  45 mg/m2.  First dose expected on 11/25/2019. Status post 7 cycles, last dose was given 01/06/2020 with partial response.  3)  Immunotherapy with Keytruda  200 mg IV every 3 weeks.  First dose February 10, 2020 for a patient with MSI high.  Status post 35  cycles. 4) Avastin  15 mg/KG every 3 weeks.  First dose today for the vasogenic edema of the brain.S/P 7 cycles. 5) The patient had evidence for local disease recurrence in July 2024. 9) Resuming his treatment with Keytruda  200 Mg IV every 3 weeks. First dose 11/16/2022. Status post 10 cycles  10) SRS to the new metastatic brain lesions under the care of Dr. Dewey, completed on 06/21/2023   CURRENT THERAPY:  Palliative systemic chemotherapy with carboplatin  for an AUC of 5, alimta  500 mg/m, and Keytruda . First dose on 06/28/23. He is status post 4 cycles.  Starting from cycle #5 the patient has been on maintenance treatment with single agent Alimta  400 Mg/M2 every 3 weeks.  INTERVAL HISTORY: Joseph Atkins 63 y.o. male returns to  the clinic today for follow-up visit accompanied by his wife. Discussed the use of AI scribe software for clinical note transcription with the patient, who gave verbal consent to proceed.  History of Present Illness Joseph Atkins is a 63 year old male with stage four non-small cell lung cancer who presents for restaging of his disease. He is accompanied by his wife.  He was diagnosed with stage four non-small cell lung cancer, adenocarcinoma, in July 2021. He experiences persistent fatigue and engages in minimal physical activity. He uses supplemental oxygen , initially at four liters following a hospitalization for pneumonia, now reduced to two liters. He uses oxygen  at night and intermittently during the day, with oxygen  saturation mostly in the eighties when inactive. He monitors his oxygen  levels at home and uses supplemental oxygen  when saturation drops below 88%.  He has experienced bilateral lung inflammation and was treated with steroids, which he has completed.  He is currently on Alimta  chemotherapy at a reduced dose, having previously received Keytruda , which he is no longer taking.  He uses Hycodan syrup as needed for a persistent cough, taking it only when the cough is severe during the day.    MEDICAL HISTORY: Past Medical History:  Diagnosis Date   Cancer (HCC)    lung cancer   Diverticulosis    GERD (gastroesophageal reflux disease)    Hx of small bowel obstruction    Hyperlipidemia    Hypertension    Hypothyroidism    IBS (irritable bowel syndrome)    Substance abuse (HCC)    Alcoholic, Drug addition  Thyroid  disease     ALLERGIES:  is allergic to penicillins.  MEDICATIONS:  Current Outpatient Medications  Medication Sig Dispense Refill   acetaminophen  (TYLENOL ) 500 MG tablet Take 1,000 mg by mouth every 6 (six) hours as needed for mild pain (pain score 1-3).     albuterol  (PROVENTIL  HFA;VENTOLIN  HFA) 108 (90 Base) MCG/ACT inhaler Inhale 2 puffs into the  lungs every 6 (six) hours as needed for wheezing or shortness of breath. 1 Inhaler 0   carvedilol  (COREG ) 3.125 MG tablet Take 1 tablet (3.125 mg total) by mouth 2 (two) times daily with a meal. 30 tablet 1   [facility] ASPIRIN  LOW DOSE 81 MG tablet TAKE 1 TABLET (81 MG TOTAL) BY MOUTH DAILY. SWALLOW WHOLE. 90 tablet 3   DULoxetine  (CYMBALTA ) 20 MG capsule Take 1 capsule (20 mg total) by mouth 2 (two) times daily. 60 capsule 5   fluticasone  furoate-vilanterol (BREO ELLIPTA ) 100-25 MCG/INH AEPB Inhale 1 puff into the lungs daily. 30 each 3   folic acid  (FOLVITE ) 1 MG tablet TAKE 1 TABLET BY MOUTH EVERY DAY 90 tablet 0   hydrOXYzine  (ATARAX ) 25 MG tablet Take 25 mg by mouth daily.     ipratropium (ATROVENT ) 0.02 % nebulizer solution Take 2.5 mLs (0.5 mg total) by nebulization 3 (three) times daily. 75 mL 12   levalbuterol  (XOPENEX ) 0.63 MG/3ML nebulizer solution Take 3 mLs (0.63 mg total) by nebulization 3 (three) times daily. 3 mL 12   levothyroxine  (SYNTHROID ) 100 MCG tablet TAKE 1 TABLET BY MOUTH EVERY DAY 90 tablet 1   loratadine (CLARITIN) 10 MG tablet Take 10 mg by mouth daily.     LORazepam  (ATIVAN ) 1 MG tablet Take 1 tablet (1 mg total) by mouth once as needed for up to 1 dose for anxiety (MRI claustrophobia). 3 tablet 0   lumateperone  tosylate (CAPLYTA ) 10.5 MG capsule Take 42 mg by mouth at bedtime.     mupirocin  ointment (BACTROBAN ) 2 % APPLY TO AFFECTED AREA TWICE A DAY 22 g 0   omeprazole  (PRILOSEC) 40 MG capsule Take 1 capsule (40 mg total) by mouth daily. 90 capsule 4   ondansetron  (ZOFRAN ) 4 MG tablet Take 1 tablet (4 mg total) by mouth every 8 (eight) hours as needed. 40 tablet 2   pembrolizumab  (KEYTRUDA ) 100 MG/4ML SOLN Inject 2 mg/kg into the vein every 21 ( twenty-one) days.     polyethylene glycol powder (MIRALAX ) powder Take 17 g by mouth daily. 255 g 11   predniSONE  (DELTASONE ) 10 MG tablet Please take 7 tablets (70 mg) daily in the morning for 1 week, followed by 5 tablets (50 mg)  daily in the morning for 1 week, followed by 3 tablets (30 mg) daily in the morning for 1 week, followed by 2 tablets (20 mg) daily in the morning for 1 week, followed by 10 mg daily in the morning for 1 week, then stop 126 tablet 0   prochlorperazine  (COMPAZINE ) 10 MG tablet Take 1 tablet (10 mg total) by mouth every 6 (six) hours as needed. 30 tablet 2   No current facility-administered medications for this visit.    SURGICAL HISTORY:  Past Surgical History:  Procedure Laterality Date   arm surgery Right    BRONCHIAL BRUSHINGS  10/24/2019   Procedure: BRONCHIAL BRUSHINGS;  Surgeon: Shelah Lamar RAMAN, MD;  Location: [facility] ENDOSCOPY;  Service: Cardiopulmonary;;  right lower lobe    BRONCHIAL BRUSHINGS  11/05/2019   Procedure: BRONCHIAL BRUSHINGS;  Surgeon: Shelah Lamar RAMAN, MD;  Location: [facility] ENDOSCOPY;  Service: Pulmonary;;   BRONCHIAL NEEDLE ASPIRATION BIOPSY  10/24/2019   Procedure: BRONCHIAL NEEDLE ASPIRATION BIOPSIES;  Surgeon: Shelah Lamar RAMAN, MD;  Location: [facility] ENDOSCOPY;  Service: Cardiopulmonary;;   BRONCHIAL NEEDLE ASPIRATION BIOPSY  11/05/2019   Procedure: BRONCHIAL NEEDLE ASPIRATION BIOPSIES;  Surgeon: Shelah Lamar RAMAN, MD;  Location: [facility] ENDOSCOPY;  Service: Pulmonary;;   ENDOBRONCHIAL ULTRASOUND N/A 10/24/2019   Procedure: ENDOBRONCHIAL ULTRASOUND;  Surgeon: Shelah Lamar RAMAN, MD;  Location: [facility] ENDOSCOPY;  Service: Cardiopulmonary;  Laterality: N/A;   FINGER SURGERY Right    Middle   IR IMAGING GUIDED PORT INSERTION  11/19/2019   VIDEO BRONCHOSCOPY N/A 10/24/2019   Procedure: VIDEO BRONCHOSCOPY WITHOUT FLUORO;  Surgeon: Shelah Lamar RAMAN, MD;  Location: [facility] ENDOSCOPY;  Service: Cardiopulmonary;  Laterality: N/A;   VIDEO BRONCHOSCOPY WITH ENDOBRONCHIAL NAVIGATION N/A 11/05/2019   Procedure: VIDEO BRONCHOSCOPY WITH ENDOBRONCHIAL NAVIGATION;  Surgeon: Shelah Lamar RAMAN, MD;  Location: [facility] ENDOSCOPY;  Service: Pulmonary;  Laterality: N/A;    REVIEW OF SYSTEMS:  Constitutional: positive for fatigue Eyes:  negative Ears, nose, mouth, throat, and face: negative Respiratory: positive for cough and dyspnea on exertion Cardiovascular: negative Gastrointestinal: negative Genitourinary:negative Integument/breast: negative Hematologic/lymphatic: negative Musculoskeletal:positive for arthralgias Neurological: negative Behavioral/Psych: negative Endocrine: negative Allergic/Immunologic: negative   PHYSICAL EXAMINATION: General appearance: alert, cooperative, fatigued, and no distress Head: Normocephalic, without obvious abnormality, atraumatic Neck: no adenopathy, no JVD, supple, symmetrical, trachea midline, and thyroid  not enlarged, symmetric, no tenderness/mass/nodules Lymph nodes: Cervical, supraclavicular, and axillary nodes normal. Resp: clear to auscultation bilaterally Back: symmetric, no curvature. ROM normal. No CVA tenderness. Cardio: regular rate and rhythm, S1, S2 normal, no murmur, click, rub or gallop GI: soft, non-tender; bowel sounds normal; no masses,  no organomegaly Extremities: extremities normal, atraumatic, no cyanosis or edema Neurologic: Alert and oriented X 3, normal strength and tone. Normal symmetric reflexes. Normal coordination and gait  ECOG PERFORMANCE STATUS: 1 - Symptomatic but completely ambulatory  Blood pressure 102/89, pulse (!) 117, temperature 97.8 F (36.6 C), temperature source Temporal, resp. rate 17, height 5' 8 (1.727 m), weight 159 lb (72.1 kg), SpO2 100%.  LABORATORY DATA: Lab Results  Component Value Date   WBC 10.7 (H) 10/10/2023   HGB 10.0 (L) 10/10/2023   HCT 30.3 (L) 10/10/2023   MCV 92.1 10/10/2023   PLT 224 10/10/2023      Chemistry      Component Value Date/Time   NA 129 (L) 10/10/2023 0731   NA 141 03/08/2017 1707   K 4.0 10/10/2023 0731   CL 95 (L) 10/10/2023 0731   CO2 27 10/10/2023 0731   BUN 8 10/10/2023 0731   BUN 12 03/08/2017 1707   CREATININE 0.73 10/10/2023 0731      Component Value Date/Time   CALCIUM  8.2 (L)  10/10/2023 0731   ALKPHOS 76 10/10/2023 0731   AST 18 10/10/2023 0731   ALT 15 10/10/2023 0731   BILITOT 1.2 10/10/2023 0731       RADIOGRAPHIC STUDIES: CT CHEST ABDOMEN PELVIS W CONTRAST Result Date: 11/15/2023 CLINICAL DATA:  Non-small cell lung cancer. Metastatic disease evaluation. EXAM: CT CHEST, ABDOMEN, AND PELVIS WITH CONTRAST TECHNIQUE: Multidetector CT imaging of the chest, abdomen and pelvis was performed following the standard protocol during bolus administration of intravenous contrast. RADIATION DOSE REDUCTION: This exam was performed according to the departmental dose-optimization program which includes automated exposure control, adjustment of the mA and/or kV according to patient size and/or use of iterative reconstruction technique. CONTRAST:  OMNIPAQUE   IOHEXOL  300 MG/ML  SOLN COMPARISON:  Chest CT dated 09/25/2023. FINDINGS: CT CHEST FINDINGS Cardiovascular: There is no cardiomegaly. Small pericardial effusion measures 1 cm in thickness. There is coronary vascular calcification. Mild atherosclerotic calcification of the thoracic aorta. No as 1 dilatation or dissection. The origins of the great vessels of the aortic arch and the central pulmonary arteries appear patent. Right-sided Port-A-Cath with tip at the cavoatrial junction. Mediastinum/Nodes: No obvious hilar or mediastinal adenopathy. Evaluation of the right hilum is however limited due to consolidative changes of the adjacent lung. The esophagus is grossly unremarkable no mediastinal fluid collection. Lungs/Pleura: Small right pleural effusion. There is a 6 x 7 cm masslike area of consolidation in the right lower lobe. Partial consolidation of the right middle lobe with air bronchogram. There is background of emphysema. Significant improvement in diffuse parenchymal ground-glass density compared to prior CT. No pneumothorax. There is narrowing of the right lower lobe bronchus which may be related to mass effect by the right  lower lobe consolidation/mass or due to mucous secretion. The central airways otherwise remain patent. Musculoskeletal: No acute osseous pathology. CT ABDOMEN PELVIS FINDINGS No intra-abdominal free air or free fluid. Hepatobiliary: The liver is unremarkable. No biliary dilatation. The gallbladder is unremarkable. Pancreas: Unremarkable. No pancreatic ductal dilatation or surrounding inflammatory changes. Spleen: Normal in size without focal abnormality. Adrenals/Urinary Tract: The adrenal glands unremarkable. The kidneys, visualized ureters, and urinary bladder appear unremarkable. Stomach/Bowel: There is moderate stool throughout the colon. Focal area of thickening of the distal transverse colon measuring approximately 3.7 cm in length (78/2 and coronal 49/4) may be related to underdistention. Underlying colonic mass is not excluded. Follow-up with colonoscopy is recommended. There is no bowel obstruction or active inflammation. The appendix is normal. Vascular/Lymphatic: Advanced aortoiliac atherosclerotic disease. The IVC is unremarkable no portal venous gas. There is no adenopathy. Reproductive: The prostate and seminal vesicles are grossly unremarkable. No pelvic mass. Other: None Musculoskeletal: Degenerative changes of the spine. No acute osseous pathology. IMPRESSION: 1. Masslike area of consolidation in the medial right lower lobe similar to prior CT. Although this may represent an infectious process, recurrent mass is not excluded. 2. Partial consolidation of the right middle lobe with air bronchogram, likely atelectasis or infiltrate. 3. Small right pleural effusion. 4. No evidence of metastatic disease in the abdomen or pelvis. 5. Focal area of thickening of the distal transverse colon may be related to underdistention. Underlying colonic mass is not excluded. Follow-up with colonoscopy is recommended. No bowel obstruction. Normal appendix. 6. Aortic Atherosclerosis (ICD10-I70.0) and Emphysema  (ICD10-J43.9). Electronically Signed   By: Vanetta Chou M.D.   On: 11/15/2023 17:42     ASSESSMENT AND PLAN: This is a very pleasant 63 years old white male with stage IV (T3, N0, M1c) non-small cell lung cancer, adenocarcinoma with MSI high presented with right lower lobe/infrahilar mass in addition to solitary brain metastasis in the left cerebellum diagnosed in July 2021. He is status post SRS to the solitary brain metastasis. The patient completed a course of concurrent chemoradiation with weekly carboplatin  and paclitaxel .  He tolerated the treatment well except for fatigue and mild odynophagia. The patient has MSI high and I recommended for him treatment with immunotherapy with single agent Keytruda  200 mg IV every 3 weeks for a total of 2 years unless the patient has unacceptable toxicity or disease progression. He is status post 35  cycles of treatment with Keytruda .  He also received 7 cycles of Avastin  for the vasogenic  edema in the brain.   Avastin  will be on hold for now unless needed in the future. The patient tolerated his previous treatment with immunotherapy fairly well. The patient has been on observation and he is feeling fine with no concerning complaints except for the mild cough and wheezing. His restaging scan in June 2024 showed no concerning findings for disease progression except for interval increase in the adjacent postobstructive airspace disease close to the treated mass of the infra right lower lobe concerning for local disease recurrence.  He had a PET scan performed on 11/04/2022.  I personally and independently reviewed the scan images before the final report became available.  It showed increased radiotracer uptake associated with the masslike architectural distortion within the central aspect of the right lower lobe concerning for local disease recurrence.  But there was no evidence for nodal metastasis or distant metastatic disease. I discussed the result with the  patient and his wife.  The patient received curative radiotherapy to this area in the past and he may not be a great candidate for reirradiation.  He has MSI high and I recommended for him to consider resuming his treatment with Keytruda  again at 200 Mg IV every 3 weeks.  He was also given the option of close monitoring and observation. The patient decided to resume his treatment with Keytruda  200 Mg IV every 3 weeks.  Status post 10 cycles. He had evidence for disease progression and we added systemic chemotherapy with reduced dose carboplatin  and Alimta  to his current treatment with Keytruda .  He is status post 3 cycles of the new regimen.  Carboplatin  and Keytruda  were discontinued after cycle #3 secondary to immunotherapy mediated pneumonitis and the patient is currently on maintenance treatment with single agent Alimta  400 Mg/M2 IV every 3 weeks.  He received 1 treatment of the maintenance Alimta . He had repeat CT scan of the chest, abdomen and pelvis performed recently.  I personally and independently reviewed the scan images and discussed the results with the patient and his wife today.  His scan showed no concerning findings for disease progression but showed the continuous suspicious groundglass opacities suspicious for immunotherapy mediated pneumonitis.  He was treated with a tapering dose of prednisone . Assessment and Plan Assessment & Plan Stage 4 non-small cell lung cancer, adenocarcinoma Diagnosed in July 2021. Current imaging shows no evidence of cancer growth or metastasis, but an obstructive mass is present on the right side. Alimta  treatment is ongoing and effective. Keytruda  has been discontinued. - Administer Alimta  at a reduced dose  Inflammation due to immunotherapy Inflammation on both sides likely due to previous immunotherapy. Currently improving but still present. Steroid treatment has been completed, and inflammation is expected to resolve over time.  Hypoxemia Oxygen   saturation often in the 80s without supplemental oxygen . Advised to maintain saturation in the 90s. Current guidance is to use oxygen  if saturation drops below 88%. - Monitor oxygen  saturation regularly - Use supplemental oxygen  if saturation drops below 88%  Cough Intermittent cough managed with Hycodan syrup as needed. Cough is not constant and Hycodan is used only during episodes of severe coughing. - Prescribe Hycodan syrup for cough as needed The patient was advised to call immediately if he has any concerning symptoms in the interval.  The patient voices understanding of current disease status and treatment options and is in agreement with the current care plan.  All questions were answered. The patient knows to call the clinic with any problems, questions or concerns. We  can certainly see the patient much sooner if necessary. The total time spent in the appointment was 30 minutes.  Disclaimer: This note was dictated with voice recognition software. Similar sounding words can inadvertently be transcribed and may not be corrected upon review.

## 2023-11-21 ENCOUNTER — Other Ambulatory Visit: Payer: Self-pay

## 2023-11-22 ENCOUNTER — Other Ambulatory Visit

## 2023-11-22 ENCOUNTER — Ambulatory Visit

## 2023-11-22 ENCOUNTER — Ambulatory Visit: Admitting: Physician Assistant

## 2023-11-23 ENCOUNTER — Other Ambulatory Visit

## 2023-11-30 ENCOUNTER — Ambulatory Visit: Admitting: Physician Assistant

## 2023-11-30 ENCOUNTER — Ambulatory Visit

## 2023-11-30 ENCOUNTER — Other Ambulatory Visit

## 2023-12-06 ENCOUNTER — Inpatient Hospital Stay

## 2023-12-06 ENCOUNTER — Inpatient Hospital Stay: Attending: Internal Medicine

## 2023-12-06 ENCOUNTER — Other Ambulatory Visit: Payer: Self-pay

## 2023-12-06 ENCOUNTER — Inpatient Hospital Stay: Admitting: Internal Medicine

## 2023-12-06 VITALS — BP 115/79 | HR 110 | Temp 97.3°F | Resp 17 | Ht 68.0 in | Wt 156.0 lb

## 2023-12-06 DIAGNOSIS — C787 Secondary malignant neoplasm of liver and intrahepatic bile duct: Secondary | ICD-10-CM | POA: Insufficient documentation

## 2023-12-06 DIAGNOSIS — C7931 Secondary malignant neoplasm of brain: Secondary | ICD-10-CM | POA: Diagnosis present

## 2023-12-06 DIAGNOSIS — I10 Essential (primary) hypertension: Secondary | ICD-10-CM | POA: Diagnosis not present

## 2023-12-06 DIAGNOSIS — Z7982 Long term (current) use of aspirin: Secondary | ICD-10-CM | POA: Insufficient documentation

## 2023-12-06 DIAGNOSIS — Z79899 Other long term (current) drug therapy: Secondary | ICD-10-CM | POA: Insufficient documentation

## 2023-12-06 DIAGNOSIS — Z87891 Personal history of nicotine dependence: Secondary | ICD-10-CM | POA: Diagnosis not present

## 2023-12-06 DIAGNOSIS — Z801 Family history of malignant neoplasm of trachea, bronchus and lung: Secondary | ICD-10-CM | POA: Insufficient documentation

## 2023-12-06 DIAGNOSIS — E785 Hyperlipidemia, unspecified: Secondary | ICD-10-CM | POA: Diagnosis not present

## 2023-12-06 DIAGNOSIS — C3491 Malignant neoplasm of unspecified part of right bronchus or lung: Secondary | ICD-10-CM | POA: Diagnosis not present

## 2023-12-06 DIAGNOSIS — C3490 Malignant neoplasm of unspecified part of unspecified bronchus or lung: Secondary | ICD-10-CM

## 2023-12-06 DIAGNOSIS — Z923 Personal history of irradiation: Secondary | ICD-10-CM | POA: Diagnosis not present

## 2023-12-06 DIAGNOSIS — E039 Hypothyroidism, unspecified: Secondary | ICD-10-CM | POA: Insufficient documentation

## 2023-12-06 DIAGNOSIS — C3431 Malignant neoplasm of lower lobe, right bronchus or lung: Secondary | ICD-10-CM | POA: Insufficient documentation

## 2023-12-06 DIAGNOSIS — T451X5A Adverse effect of antineoplastic and immunosuppressive drugs, initial encounter: Secondary | ICD-10-CM | POA: Insufficient documentation

## 2023-12-06 DIAGNOSIS — Z95828 Presence of other vascular implants and grafts: Secondary | ICD-10-CM

## 2023-12-06 DIAGNOSIS — D6481 Anemia due to antineoplastic chemotherapy: Secondary | ICD-10-CM | POA: Diagnosis not present

## 2023-12-06 DIAGNOSIS — D649 Anemia, unspecified: Secondary | ICD-10-CM

## 2023-12-06 LAB — SAMPLE TO BLOOD BANK

## 2023-12-06 LAB — CMP (CANCER CENTER ONLY)
ALT: 5 U/L (ref 0–44)
AST: 10 U/L — ABNORMAL LOW (ref 15–41)
Albumin: 2.9 g/dL — ABNORMAL LOW (ref 3.5–5.0)
Alkaline Phosphatase: 102 U/L (ref 38–126)
Anion gap: 8 (ref 5–15)
BUN: <5 mg/dL — ABNORMAL LOW (ref 8–23)
CO2: 27 mmol/L (ref 22–32)
Calcium: 8.3 mg/dL — ABNORMAL LOW (ref 8.9–10.3)
Chloride: 92 mmol/L — ABNORMAL LOW (ref 98–111)
Creatinine: 0.56 mg/dL — ABNORMAL LOW (ref 0.61–1.24)
GFR, Estimated: >60 mL/min (ref >60)
Glucose, Bld: 108 mg/dL — ABNORMAL HIGH (ref 70–99)
Potassium: 3.3 mmol/L — ABNORMAL LOW (ref 3.5–5.1)
Sodium: 127 mmol/L — ABNORMAL LOW (ref 135–145)
Total Bilirubin: 0.9 mg/dL (ref 0.0–1.2)
Total Protein: 5.9 g/dL — ABNORMAL LOW (ref 6.5–8.1)

## 2023-12-06 LAB — CBC WITH DIFFERENTIAL (CANCER CENTER ONLY)
Abs Immature Granulocytes: 0.21 K/uL — ABNORMAL HIGH (ref 0.00–0.07)
Basophils Absolute: 0.1 K/uL (ref 0.0–0.1)
Basophils Relative: 1 %
Eosinophils Absolute: 0 K/uL (ref 0.0–0.5)
Eosinophils Relative: 0 %
HCT: 25.5 % — ABNORMAL LOW (ref 39.0–52.0)
Hemoglobin: 8.1 g/dL — ABNORMAL LOW (ref 13.0–17.0)
Immature Granulocytes: 2 %
Lymphocytes Relative: 11 %
Lymphs Abs: 1.2 K/uL (ref 0.7–4.0)
MCH: 30.2 pg (ref 26.0–34.0)
MCHC: 31.8 g/dL (ref 30.0–36.0)
MCV: 95.1 fL (ref 80.0–100.0)
Monocytes Absolute: 1.5 K/uL — ABNORMAL HIGH (ref 0.1–1.0)
Monocytes Relative: 14 %
Neutro Abs: 8.3 K/uL — ABNORMAL HIGH (ref 1.7–7.7)
Neutrophils Relative %: 72 %
Platelet Count: 426 K/uL — ABNORMAL HIGH (ref 150–400)
RBC: 2.68 MIL/uL — ABNORMAL LOW (ref 4.22–5.81)
RDW: 18 % — ABNORMAL HIGH (ref 11.5–15.5)
WBC Count: 11.4 K/uL — ABNORMAL HIGH (ref 4.0–10.5)
nRBC: 0 % (ref 0.0–0.2)

## 2023-12-06 LAB — PREPARE RBC (CROSSMATCH)

## 2023-12-06 MED ORDER — SODIUM CHLORIDE 0.9% FLUSH
10.0000 mL | Freq: Once | INTRAVENOUS | Status: AC
Start: 2023-12-06 — End: 2023-12-06
  Administered 2023-12-06 (×2): 10 mL

## 2023-12-06 NOTE — Progress Notes (Signed)
 [name] [phone]   [phone]  OFFICE PROGRESS NOTE  [name], NP [address]  [address] [place] [address]  DIAGNOSIS: Stage IV (T3, N0, M1C) non-small cell lung cancer, adenocarcinoma.  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 He was diagnosed in July 2021.   Molecular Biomarkers:  MSI-High DETECTED Pembrolizumab  Atezolizumab, Avelumab, Cemiplimab, Dostarlimab, Durvalumab, Ipilimumab, Nivolumab   STK11Splice Site SNV 1.9% Everolimus, Temsirolimus Yes   [id number] 1.7% Binimetinib Yes   [id number] 0.4%   Niraparib, Olaparib, Rucaparib, Talazoparib, Tazemetostat Yes   PRIOR THERAPY:  1) SRS to the solitary brain metastasis under the care of Dr. Dewey. Last treatment 11/14/19. 2) Weekly concurrent chemoradiation with carboplatin  for an AUC of 2, paclitaxel  45 mg/m2.  First dose expected on 11/25/2019. Status post 7 cycles, last dose was given 01/06/2020 with partial response.  3)  Immunotherapy with Keytruda  200 mg IV every 3 weeks.  First dose February 10, 2020 for a patient with MSI high.  Status post 35  cycles. 4) Avastin  15 mg/KG every 3 weeks.  First dose today for the vasogenic edema of the brain.S/P 7 cycles. 5) The patient had evidence for local disease recurrence in July 2024. 9) Resuming his treatment with Keytruda  200 Mg IV every 3 weeks. First dose 11/16/2022. Status post 10 cycles  10) SRS to the new metastatic brain lesions under the care of Dr. Dewey, completed on 06/21/2023   CURRENT THERAPY:  Palliative systemic chemotherapy with carboplatin  for an AUC of 5, alimta  500 mg/m, and Keytruda . First dose on 06/28/23. He is status post 5 cycles.  Starting from cycle #5 the patient has been on maintenance treatment with single agent Alimta  400 Mg/M2 every 3 weeks.  INTERVAL HISTORY: Joseph Atkins 63 y.o. male returns to  the clinic today for follow-up visit accompanied by his wife. Discussed the use of AI scribe software for clinical note transcription with the patient, who gave verbal consent to proceed.  History of Present Illness Joseph Atkins is a 63 year old male with stage four non-small cell lung cancer who presents for cycle number six of maintenance therapy with Alimta . He is accompanied by his wife.  He was diagnosed with stage four non-small cell lung cancer, adenocarcinoma, in July 2021, with MSI high status. Initially, he received immunotherapy with Keytruda  for two years, followed by observation. Upon disease progression, he began a regimen of carboplatin , Alimta , and Keytruda  every three weeks for four cycles. He has since transitioned to maintenance therapy with single-agent Alimta  and has completed one cycle. He is here for his second cycle of maintenance therapy.  He experiences significant fatigue and lack of energy following his last round of chemotherapy, stating he 'stayed on the couch' for three weeks. He also has pain in his legs, knees, shin bones, and neck. He feels 'tired of all of it' and describes a general sense of being 'done' with the treatment process.  His hemoglobin level is 8.1. He has an MRI scheduled for tomorrow.    MEDICAL HISTORY: Past Medical History:  Diagnosis Date   Cancer (HCC)    lung cancer   Diverticulosis    GERD (gastroesophageal reflux disease)    Hx of small bowel obstruction    Hyperlipidemia    Hypertension    Hypothyroidism    IBS (irritable bowel syndrome)    Substance abuse (  HCC)    Alcoholic, Drug addition   Thyroid  disease     ALLERGIES:  is allergic to penicillins.  MEDICATIONS:  Current Outpatient Medications  Medication Sig Dispense Refill   acetaminophen  (TYLENOL ) 500 MG tablet Take 1,000 mg by mouth every 6 (six) hours as needed for mild pain (pain score 1-3).     albuterol  (PROVENTIL  HFA;VENTOLIN  HFA) 108 (90 Base) MCG/ACT  inhaler Inhale 2 puffs into the lungs every 6 (six) hours as needed for wheezing or shortness of breath. 1 Inhaler 0   carvedilol  (COREG ) 3.125 MG tablet Take 1 tablet (3.125 mg total) by mouth 2 (two) times daily with a meal. 30 tablet 1   [facility] ASPIRIN  LOW DOSE 81 MG tablet TAKE 1 TABLET (81 MG TOTAL) BY MOUTH DAILY. SWALLOW WHOLE. 90 tablet 3   DULoxetine  (CYMBALTA ) 20 MG capsule Take 1 capsule (20 mg total) by mouth 2 (two) times daily. 60 capsule 5   fluticasone  furoate-vilanterol (BREO ELLIPTA ) 100-25 MCG/INH AEPB Inhale 1 puff into the lungs daily. 30 each 3   folic acid  (FOLVITE ) 1 MG tablet TAKE 1 TABLET BY MOUTH EVERY DAY 90 tablet 0   HYDROcodone  bit-homatropine (HYCODAN) 5-1.5 MG/5ML syrup Take 5 mLs by mouth every 6 (six) hours as needed for cough. 120 mL 0   hydrOXYzine  (ATARAX ) 25 MG tablet Take 25 mg by mouth daily.     ipratropium (ATROVENT ) 0.02 % nebulizer solution Take 2.5 mLs (0.5 mg total) by nebulization 3 (three) times daily. 75 mL 12   levalbuterol  (XOPENEX ) 0.63 MG/3ML nebulizer solution Take 3 mLs (0.63 mg total) by nebulization 3 (three) times daily. 3 mL 12   levothyroxine  (SYNTHROID ) 100 MCG tablet TAKE 1 TABLET BY MOUTH EVERY DAY 90 tablet 1   loratadine (CLARITIN) 10 MG tablet Take 10 mg by mouth daily.     LORazepam  (ATIVAN ) 1 MG tablet Take 1 tablet (1 mg total) by mouth once as needed for up to 1 dose for anxiety (MRI claustrophobia). 3 tablet 0   lumateperone  tosylate (CAPLYTA ) 10.5 MG capsule Take 42 mg by mouth at bedtime.     mupirocin  ointment (BACTROBAN ) 2 % APPLY TO AFFECTED AREA TWICE A DAY 22 g 0   omeprazole  (PRILOSEC) 40 MG capsule Take 1 capsule (40 mg total) by mouth daily. 90 capsule 4   ondansetron  (ZOFRAN ) 4 MG tablet Take 1 tablet (4 mg total) by mouth every 8 (eight) hours as needed. 40 tablet 2   pembrolizumab  (KEYTRUDA ) 100 MG/4ML SOLN Inject 2 mg/kg into the vein every 21 ( twenty-one) days.     polyethylene glycol powder (MIRALAX ) powder Take  17 g by mouth daily. 255 g 11   predniSONE  (DELTASONE ) 10 MG tablet Please take 7 tablets (70 mg) daily in the morning for 1 week, followed by 5 tablets (50 mg) daily in the morning for 1 week, followed by 3 tablets (30 mg) daily in the morning for 1 week, followed by 2 tablets (20 mg) daily in the morning for 1 week, followed by 10 mg daily in the morning for 1 week, then stop 126 tablet 0   prochlorperazine  (COMPAZINE ) 10 MG tablet Take 1 tablet (10 mg total) by mouth every 6 (six) hours as needed. 30 tablet 2   No current facility-administered medications for this visit.    SURGICAL HISTORY:  Past Surgical History:  Procedure Laterality Date   arm surgery Right    BRONCHIAL BRUSHINGS  10/24/2019   Procedure: BRONCHIAL BRUSHINGS;  Surgeon: Shelah,  Lamar RAMAN, MD;  Location: [facility] ENDOSCOPY;  Service: Cardiopulmonary;;  right lower lobe    BRONCHIAL BRUSHINGS  11/05/2019   Procedure: BRONCHIAL BRUSHINGS;  Surgeon: Shelah Lamar RAMAN, MD;  Location: [facility] ENDOSCOPY;  Service: Pulmonary;;   BRONCHIAL NEEDLE ASPIRATION BIOPSY  10/24/2019   Procedure: BRONCHIAL NEEDLE ASPIRATION BIOPSIES;  Surgeon: Shelah Lamar RAMAN, MD;  Location: [facility] ENDOSCOPY;  Service: Cardiopulmonary;;   BRONCHIAL NEEDLE ASPIRATION BIOPSY  11/05/2019   Procedure: BRONCHIAL NEEDLE ASPIRATION BIOPSIES;  Surgeon: Shelah Lamar RAMAN, MD;  Location: [facility] ENDOSCOPY;  Service: Pulmonary;;   ENDOBRONCHIAL ULTRASOUND N/A 10/24/2019   Procedure: ENDOBRONCHIAL ULTRASOUND;  Surgeon: Shelah Lamar RAMAN, MD;  Location: [facility] ENDOSCOPY;  Service: Cardiopulmonary;  Laterality: N/A;   FINGER SURGERY Right    Middle   IR IMAGING GUIDED PORT INSERTION  11/19/2019   VIDEO BRONCHOSCOPY N/A 10/24/2019   Procedure: VIDEO BRONCHOSCOPY WITHOUT FLUORO;  Surgeon: Shelah Lamar RAMAN, MD;  Location: [facility] ENDOSCOPY;  Service: Cardiopulmonary;  Laterality: N/A;   VIDEO BRONCHOSCOPY WITH ENDOBRONCHIAL NAVIGATION N/A 11/05/2019   Procedure: VIDEO BRONCHOSCOPY WITH ENDOBRONCHIAL NAVIGATION;   Surgeon: Shelah Lamar RAMAN, MD;  Location: [facility] ENDOSCOPY;  Service: Pulmonary;  Laterality: N/A;    REVIEW OF SYSTEMS:  Constitutional: positive for fatigue Eyes: negative Ears, nose, mouth, throat, and face: negative Respiratory: positive for dyspnea on exertion Cardiovascular: negative Gastrointestinal: negative Genitourinary:negative Integument/breast: negative Hematologic/lymphatic: negative Musculoskeletal:positive for arthralgias and muscle weakness Neurological: negative Behavioral/Psych: negative Endocrine: negative Allergic/Immunologic: negative   PHYSICAL EXAMINATION: General appearance: alert, cooperative, fatigued, and no distress Head: Normocephalic, without obvious abnormality, atraumatic Neck: no adenopathy, no JVD, supple, symmetrical, trachea midline, and thyroid  not enlarged, symmetric, no tenderness/mass/nodules Lymph nodes: Cervical, supraclavicular, and axillary nodes normal. Resp: clear to auscultation bilaterally Back: symmetric, no curvature. ROM normal. No CVA tenderness. Cardio: regular rate and rhythm, S1, S2 normal, no murmur, click, rub or gallop GI: soft, non-tender; bowel sounds normal; no masses,  no organomegaly Extremities: extremities normal, atraumatic, no cyanosis or edema Neurologic: Alert and oriented X 3, normal strength and tone. Normal symmetric reflexes. Normal coordination and gait  ECOG PERFORMANCE STATUS: 1 - Symptomatic but completely ambulatory  Blood pressure 115/79, pulse (!) 110, temperature (!) 97.3 F (36.3 C), temperature source Temporal, resp. rate 17, height 5' 8 (1.727 m), weight 156 lb (70.8 kg), SpO2 100%.  LABORATORY DATA: Lab Results  Component Value Date   WBC 11.4 (H) 12/06/2023   HGB 8.1 (L) 12/06/2023   HCT 25.5 (L) 12/06/2023   MCV 95.1 12/06/2023   PLT 426 (H) 12/06/2023      Chemistry      Component Value Date/Time   NA 131 (L) 11/16/2023 1105   NA 141 03/08/2017 1707   K 3.9 11/16/2023 1105   CL 97  (L) 11/16/2023 1105   CO2 26 11/16/2023 1105   BUN 6 (L) 11/16/2023 1105   BUN 12 03/08/2017 1707   CREATININE 0.78 11/16/2023 1105      Component Value Date/Time   CALCIUM  8.3 (L) 11/16/2023 1105   ALKPHOS 94 11/16/2023 1105   AST 9 (L) 11/16/2023 1105   ALT 9 11/16/2023 1105   BILITOT 0.8 11/16/2023 1105       RADIOGRAPHIC STUDIES: CT CHEST ABDOMEN PELVIS W CONTRAST Result Date: 11/15/2023 CLINICAL DATA:  Non-small cell lung cancer. Metastatic disease evaluation. EXAM: CT CHEST, ABDOMEN, AND PELVIS WITH CONTRAST TECHNIQUE: Multidetector CT imaging of the chest, abdomen and pelvis was performed following the standard protocol during bolus administration of intravenous contrast.  RADIATION DOSE REDUCTION: This exam was performed according to the departmental dose-optimization program which includes automated exposure control, adjustment of the mA and/or kV according to patient size and/or use of iterative reconstruction technique. CONTRAST:  OMNIPAQUE  IOHEXOL  300 MG/ML  SOLN COMPARISON:  Chest CT dated 09/25/2023. FINDINGS: CT CHEST FINDINGS Cardiovascular: There is no cardiomegaly. Small pericardial effusion measures 1 cm in thickness. There is coronary vascular calcification. Mild atherosclerotic calcification of the thoracic aorta. No as 1 dilatation or dissection. The origins of the great vessels of the aortic arch and the central pulmonary arteries appear patent. Right-sided Port-A-Cath with tip at the cavoatrial junction. Mediastinum/Nodes: No obvious hilar or mediastinal adenopathy. Evaluation of the right hilum is however limited due to consolidative changes of the adjacent lung. The esophagus is grossly unremarkable no mediastinal fluid collection. Lungs/Pleura: Small right pleural effusion. There is a 6 x 7 cm masslike area of consolidation in the right lower lobe. Partial consolidation of the right middle lobe with air bronchogram. There is background of emphysema. Significant  improvement in diffuse parenchymal ground-glass density compared to prior CT. No pneumothorax. There is narrowing of the right lower lobe bronchus which may be related to mass effect by the right lower lobe consolidation/mass or due to mucous secretion. The central airways otherwise remain patent. Musculoskeletal: No acute osseous pathology. CT ABDOMEN PELVIS FINDINGS No intra-abdominal free air or free fluid. Hepatobiliary: The liver is unremarkable. No biliary dilatation. The gallbladder is unremarkable. Pancreas: Unremarkable. No pancreatic ductal dilatation or surrounding inflammatory changes. Spleen: Normal in size without focal abnormality. Adrenals/Urinary Tract: The adrenal glands unremarkable. The kidneys, visualized ureters, and urinary bladder appear unremarkable. Stomach/Bowel: There is moderate stool throughout the colon. Focal area of thickening of the distal transverse colon measuring approximately 3.7 cm in length (78/2 and coronal 49/4) may be related to underdistention. Underlying colonic mass is not excluded. Follow-up with colonoscopy is recommended. There is no bowel obstruction or active inflammation. The appendix is normal. Vascular/Lymphatic: Advanced aortoiliac atherosclerotic disease. The IVC is unremarkable no portal venous gas. There is no adenopathy. Reproductive: The prostate and seminal vesicles are grossly unremarkable. No pelvic mass. Other: None Musculoskeletal: Degenerative changes of the spine. No acute osseous pathology. IMPRESSION: 1. Masslike area of consolidation in the medial right lower lobe similar to prior CT. Although this may represent an infectious process, recurrent mass is not excluded. 2. Partial consolidation of the right middle lobe with air bronchogram, likely atelectasis or infiltrate. 3. Small right pleural effusion. 4. No evidence of metastatic disease in the abdomen or pelvis. 5. Focal area of thickening of the distal transverse colon may be related to  underdistention. Underlying colonic mass is not excluded. Follow-up with colonoscopy is recommended. No bowel obstruction. Normal appendix. 6. Aortic Atherosclerosis (ICD10-I70.0) and Emphysema (ICD10-J43.9). Electronically Signed   By: Vanetta Chou M.D.   On: 11/15/2023 17:42     ASSESSMENT AND PLAN: This is a very pleasant 63 years old white male with stage IV (T3, N0, M1c) non-small cell lung cancer, adenocarcinoma with MSI high presented with right lower lobe/infrahilar mass in addition to solitary brain metastasis in the left cerebellum diagnosed in July 2021. He is status post SRS to the solitary brain metastasis. The patient completed a course of concurrent chemoradiation with weekly carboplatin  and paclitaxel .  He tolerated the treatment well except for fatigue and mild odynophagia. The patient has MSI high and I recommended for him treatment with immunotherapy with single agent Keytruda  200 mg IV every 3  weeks for a total of 2 years unless the patient has unacceptable toxicity or disease progression. He is status post 35  cycles of treatment with Keytruda .  He also received 7 cycles of Avastin  for the vasogenic edema in the brain.   Avastin  will be on hold for now unless needed in the future. The patient tolerated his previous treatment with immunotherapy fairly well. The patient has been on observation and he is feeling fine with no concerning complaints except for the mild cough and wheezing. His restaging scan in June 2024 showed no concerning findings for disease progression except for interval increase in the adjacent postobstructive airspace disease close to the treated mass of the infra right lower lobe concerning for local disease recurrence.  He had a PET scan performed on 11/04/2022.  I personally and independently reviewed the scan images before the final report became available.  It showed increased radiotracer uptake associated with the masslike architectural distortion within  the central aspect of the right lower lobe concerning for local disease recurrence.  But there was no evidence for nodal metastasis or distant metastatic disease. I discussed the result with the patient and his wife.  The patient received curative radiotherapy to this area in the past and he may not be a great candidate for reirradiation.  He has MSI high and I recommended for him to consider resuming his treatment with Keytruda  again at 200 Mg IV every 3 weeks.  He was also given the option of close monitoring and observation. The patient decided to resume his treatment with Keytruda  200 Mg IV every 3 weeks.  Status post 10 cycles. He had evidence for disease progression and we added systemic chemotherapy with reduced dose carboplatin  and Alimta  to his current treatment with Keytruda .  He is status post 5 cycles of the new regimen.  Carboplatin  and Keytruda  were discontinued after cycle #3 secondary to immunotherapy mediated pneumonitis and the patient is currently on maintenance treatment with single agent Alimta  400 Mg/M2 IV every 3 weeks.  He received 1 treatment of the maintenance Alimta . Assessment and Plan Assessment & Plan Stage IV non-small cell lung cancer, adenocarcinoma, MSI-high Stage IV non-small cell lung cancer, adenocarcinoma, MSI-high, diagnosed in July 2021. Previously treated with immunotherapy (Keytruda ) for two years, followed by observation. Recent disease progression led to treatment with carboplatin , Alimta , and Keytruda  every three weeks for four cycles. Currently on maintenance therapy with Alimta , status post one cycle. Present for cycle number six, which is the second cycle of maintenance therapy with Alimta . Expresses fatigue and desire to discontinue chemotherapy due to side effects. Decision to pause chemotherapy pending MRI results to assess cancer status and determine if a treatment break is feasible. - Cancel today's chemotherapy session. - Order MRI of the chest, abdomen,  and pelvis to assess cancer status. - Evaluate scan results to determine if a treatment break is feasible.  Anemia secondary to chemotherapy Anemia likely secondary to chemotherapy, with hemoglobin level at 8.1 g/dL. Contributing to fatigue and weakness. - Administer blood transfusion to improve hemoglobin levels and energy, preferably after MRI tomorrow.  Cancer-related fatigue Cancer-related fatigue exacerbated by anemia and chemotherapy. Reports significant fatigue and lack of energy, impacting quality of life. - Encourage use of over-the-counter liquid IV for hydration to help alleviate fatigue.  Pain in legs, knees, shins, and neck Pain in legs, knees, shins, and neck reported, likely related to chemotherapy side effects and overall cancer burden. He was advised to call immediately if he has any  other concerning symptoms in the interval.  The patient voices understanding of current disease status and treatment options and is in agreement with the current care plan.  All questions were answered. The patient knows to call the clinic with any problems, questions or concerns. We can certainly see the patient much sooner if necessary. The total time spent in the appointment was 30 minutes.  Disclaimer: This note was dictated with voice recognition software. Similar sounding words can inadvertently be transcribed and may not be corrected upon review.

## 2023-12-06 NOTE — Progress Notes (Signed)
 Spoke with Jen in the blood bank and confirmed orders for tomorrow.

## 2023-12-07 ENCOUNTER — Telehealth: Payer: Self-pay | Admitting: Internal Medicine

## 2023-12-07 ENCOUNTER — Ambulatory Visit
Admission: RE | Admit: 2023-12-07 | Discharge: 2023-12-07 | Disposition: A | Discharge status: Home / Self Care | Source: Ambulatory Visit | Attending: Internal Medicine | Admitting: Internal Medicine

## 2023-12-07 ENCOUNTER — Inpatient Hospital Stay

## 2023-12-07 DIAGNOSIS — D649 Anemia, unspecified: Secondary | ICD-10-CM

## 2023-12-07 DIAGNOSIS — C7931 Secondary malignant neoplasm of brain: Secondary | ICD-10-CM

## 2023-12-07 LAB — T4: T4, Total: 8.3 ug/dL (ref 4.5–12.0)

## 2023-12-07 LAB — TSH: TSH: 4.49 u[IU]/mL (ref 0.350–4.500)

## 2023-12-07 MED ORDER — GADOPICLENOL 0.5 MMOL/ML IV SOLN
8.0000 mL | Freq: Once | INTRAVENOUS | Status: AC | PRN
  Administered 2023-12-07: 8 mL via INTRAVENOUS

## 2023-12-07 MED ORDER — SODIUM CHLORIDE 0.9% FLUSH
10.0000 mL | INTRAVENOUS | Status: DC | PRN
  Administered 2023-12-07: 10 mL via INTRAVENOUS

## 2023-12-07 MED ORDER — ACETAMINOPHEN 325 MG PO TABS
650.0000 mg | ORAL_TABLET | Freq: Once | ORAL | Status: AC
  Administered 2023-12-07: 650 mg via ORAL
  Filled 2023-12-07: qty 2

## 2023-12-07 MED ORDER — HEPARIN SOD (PORK) LOCK FLUSH 100 UNIT/ML IV SOLN: 500.0000 [IU] | Freq: Once | INTRAVENOUS | Status: DC

## 2023-12-07 MED ORDER — SODIUM CHLORIDE 0.9% IV SOLUTION
250.0000 mL | Freq: Once | INTRAVENOUS | Status: AC
  Administered 2023-12-07: 250 mL via INTRAVENOUS

## 2023-12-07 MED ORDER — DIPHENHYDRAMINE HCL 25 MG PO CAPS
25.0000 mg | ORAL_CAPSULE | Freq: Once | ORAL | Status: AC
  Administered 2023-12-07: 25 mg via ORAL
  Filled 2023-12-07: qty 1

## 2023-12-07 NOTE — Telephone Encounter (Signed)
 Left the patient a voicemail with the rescheduled appointment details.

## 2023-12-07 NOTE — Patient Instructions (Signed)

## 2023-12-08 LAB — BPAM RBC
Blood Product Expiration Date: 202509092359
ISSUE DATE / TIME: 202508141410
Unit Type and Rh: 5100

## 2023-12-08 LAB — TYPE AND SCREEN
ABO/RH(D): O POS
Antibody Screen: POSITIVE
Donor AG Type: NEGATIVE
Unit division: 0

## 2023-12-11 ENCOUNTER — Inpatient Hospital Stay

## 2023-12-12 ENCOUNTER — Ambulatory Visit (HOSPITAL_COMMUNITY)
Admission: RE | Admit: 2023-12-12 | Discharge: 2023-12-12 | Disposition: A | Discharge status: Home / Self Care | Source: Ambulatory Visit | Attending: Internal Medicine | Admitting: Internal Medicine

## 2023-12-12 DIAGNOSIS — C3490 Malignant neoplasm of unspecified part of unspecified bronchus or lung: Secondary | ICD-10-CM | POA: Diagnosis present

## 2023-12-12 MED ORDER — IOHEXOL 300 MG/ML  SOLN
100.0000 mL | Freq: Once | INTRAMUSCULAR | Status: AC | PRN
  Administered 2023-12-12: 100 mL via INTRAVENOUS

## 2023-12-12 MED ORDER — HEPARIN SOD (PORK) LOCK FLUSH 100 UNIT/ML IV SOLN
500.0000 [IU] | Freq: Once | INTRAVENOUS | Status: AC
  Administered 2023-12-12: 500 [IU] via INTRAVENOUS

## 2023-12-13 ENCOUNTER — Ambulatory Visit: Admitting: Internal Medicine

## 2023-12-13 ENCOUNTER — Other Ambulatory Visit

## 2023-12-13 ENCOUNTER — Ambulatory Visit

## 2023-12-14 ENCOUNTER — Inpatient Hospital Stay: Admitting: Internal Medicine

## 2023-12-14 VITALS — BP 97/70 | HR 73 | Temp 97.3°F | Resp 20 | Wt 156.1 lb

## 2023-12-14 DIAGNOSIS — C3490 Malignant neoplasm of unspecified part of unspecified bronchus or lung: Secondary | ICD-10-CM | POA: Diagnosis not present

## 2023-12-14 DIAGNOSIS — C7931 Secondary malignant neoplasm of brain: Secondary | ICD-10-CM | POA: Diagnosis not present

## 2023-12-14 NOTE — Progress Notes (Signed)
 [facility] at [facility] [address]  [place], [place] [address] [phone]   Interval Evaluation  Date of Service: 12/14/23 Patient Name: Joseph Atkins Patient MRN: [id number] Patient DOB: Sep 05, 1960 Provider: Arthea MARLA Manns, MD  Identifying Statement:  Joseph Atkins is a 63 y.o. male with Malignant neoplasm of lung metastatic to brain (HCC) [C34.90, C79.31]   Primary Cancer:  Oncologic History: Oncology History  Adenocarcinoma of right lung, stage 4 with Brain Mets/nonsmall cell adenocarcinoma of his lun  11/14/2019 Initial Diagnosis   Adenocarcinoma of right lung, stage 4 (HCC)   11/25/2019 - 01/06/2020 Chemotherapy   The patient had palonosetron  (ALOXI ) injection 0.25 mg, 0.25 mg, Intravenous,  Once, 7 of 7 cycles Administration: 0.25 mg (11/25/2019), 0.25 mg (12/23/2019), 0.25 mg (12/31/2019), 0.25 mg (12/02/2019), 0.25 mg (01/06/2020), 0.25 mg (12/09/2019), 0.25 mg (12/16/2019) CARBOplatin  (PARAPLATIN ) 250 mg in sodium chloride  0.9 % 250 mL chemo infusion, 249.8 mg (100 % of original dose 249.8 mg), Intravenous,  Once, 7 of 7 cycles Dose modification: 249.8 mg (original dose 249.8 mg, Cycle 1) Administration: 250 mg (11/25/2019), 250 mg (12/23/2019), 250 mg (12/31/2019), 250 mg (12/02/2019), 250 mg (01/06/2020), 270 mg (12/09/2019), 250 mg (12/16/2019) PACLitaxel  (TAXOL ) 90 mg in sodium chloride  0.9 % 250 mL chemo infusion (</= 80mg /m2), 45 mg/m2 = 90 mg, Intravenous,  Once, 7 of 7 cycles Administration: 90 mg (11/25/2019), 90 mg (12/23/2019), 90 mg (12/31/2019), 90 mg (12/02/2019), 90 mg (01/06/2020), 90 mg (12/09/2019), 90 mg (12/16/2019)  for chemotherapy treatment.    02/18/2020 - 03/23/2022 Chemotherapy   Patient is on Treatment Plan : LUNG NSCLC Pembrolizumab  (200) q21d     03/31/2020 - 07/21/2020 Chemotherapy    Patient is on Treatment Plan: LUNG NSCLC FLAT DOSE PEMBROLIZUMAB  Q21D      05/19/2020 Cancer Staging   Staging form: Lung, AJCC 8th Edition -  Clinical: Stage IVB (cT3, cN0, cM1c) - Signed by Sherrod Sherrod, MD on 05/19/2020   11/16/2022 - 05/25/2023 Chemotherapy   Patient is on Treatment Plan : LUNG NSCLC Pembrolizumab  (200) q21d     06/28/2023 -  Chemotherapy   Patient is on Treatment Plan : LUNG Carboplatin  (5) + Pemetrexed  (500) + Pembrolizumab  (200) D1 q21d Induction x 4 cycles / Maintenance Pemetrexed  (500) + Pembrolizumab  (200) D1 q21d      CNS Oncologic History 11/14/19: Completes single frx SRS to 2.4cm cerebellar metastasis 03/05/20: SRS to 7 targets Valene) 06/19/20: SRS to 6 additional targets Valene) 12/22/22: SRS R occipital Valene) 03/03/23: SRS x2 (Moody) 06/21/23: SRS x8 Valene)  Interval History:  Joseph Atkins presents today for follow up after recent MRI brain.  He describes overall decline in function, with increased lethargy, fatigue since resuming chemotherapy.  Continues to have issues with balance, uses a cane. He remains off decadron .  No headaches or seizures.  He deferred most recent chemotherapy infusion due to quality of life concerns.  Prior- He describes recurrence of balance issues from prior.  Symptoms declined when he stopped the decadron  on 9/14.  He is needing to use a walker.  Also having more headaches and impaired memory.  He does complain of ongoing fatigue and low energy, not worse from prior. Continues Keytruda  for disease recurrence with Dr. Sherrod.  H+P (01/24/20) Patient presents today to discuss recent recurrence of neurologic symptoms following radiosurgery in July 2021.  He describes poor balance, wide based walking, needing to hold on to family or objects to walk safely.  He also  describes nausea and dizzy-headed feeling at times.  Overall he is functioning in an impaired and sluggish way.  Symptoms have become noticeable since decreasing decadron  to less than 4mg  daily; currently he is dosing 2mg  daily.  He felt quite good immediately after radiation and during higher dose steroid  therapy.  No complications from decadron  that he can report.  Recently completed chemoradioatherapy induction for lung adenocarcinoma with Dr. Sherrod.  Medications: Current Outpatient Medications on File Prior to Visit  Medication Sig Dispense Refill   acetaminophen  (TYLENOL ) 500 MG tablet Take 1,000 mg by mouth every 6 (six) hours as needed for mild pain (pain score 1-3).     albuterol  (PROVENTIL  HFA;VENTOLIN  HFA) 108 (90 Base) MCG/ACT inhaler Inhale 2 puffs into the lungs every 6 (six) hours as needed for wheezing or shortness of breath. 1 Inhaler 0   [facility] ASPIRIN  LOW DOSE 81 MG tablet TAKE 1 TABLET (81 MG TOTAL) BY MOUTH DAILY. SWALLOW WHOLE. 90 tablet 3   DULoxetine  (CYMBALTA ) 20 MG capsule Take 1 capsule (20 mg total) by mouth 2 (two) times daily. 60 capsule 5   fluticasone  furoate-vilanterol (BREO ELLIPTA ) 100-25 MCG/INH AEPB Inhale 1 puff into the lungs daily. 30 each 3   folic acid  (FOLVITE ) 1 MG tablet TAKE 1 TABLET BY MOUTH EVERY DAY 90 tablet 0   HYDROcodone  bit-homatropine (HYCODAN) 5-1.5 MG/5ML syrup Take 5 mLs by mouth every 6 (six) hours as needed for cough. 120 mL 0   hydrOXYzine  (ATARAX ) 25 MG tablet Take 25 mg by mouth daily.     ipratropium (ATROVENT ) 0.02 % nebulizer solution Take 2.5 mLs (0.5 mg total) by nebulization 3 (three) times daily. 75 mL 12   levalbuterol  (XOPENEX ) 0.63 MG/3ML nebulizer solution Take 3 mLs (0.63 mg total) by nebulization 3 (three) times daily. 3 mL 12   levothyroxine  (SYNTHROID ) 100 MCG tablet TAKE 1 TABLET BY MOUTH EVERY DAY 90 tablet 1   loratadine (CLARITIN) 10 MG tablet Take 10 mg by mouth daily.     LORazepam  (ATIVAN ) 1 MG tablet Take 1 tablet (1 mg total) by mouth once as needed for up to 1 dose for anxiety (MRI claustrophobia). 3 tablet 0   lumateperone  tosylate (CAPLYTA ) 10.5 MG capsule Take 42 mg by mouth at bedtime.     mupirocin  ointment (BACTROBAN ) 2 % APPLY TO AFFECTED AREA TWICE A DAY 22 g 0   omeprazole  (PRILOSEC) 40 MG capsule Take 1  capsule (40 mg total) by mouth daily. 90 capsule 4   ondansetron  (ZOFRAN ) 4 MG tablet Take 1 tablet (4 mg total) by mouth every 8 (eight) hours as needed. 40 tablet 2   pembrolizumab  (KEYTRUDA ) 100 MG/4ML SOLN Inject 2 mg/kg into the vein every 21 ( twenty-one) days.     polyethylene glycol powder (MIRALAX ) powder Take 17 g by mouth daily. 255 g 11   prochlorperazine  (COMPAZINE ) 10 MG tablet Take 1 tablet (10 mg total) by mouth every 6 (six) hours as needed. 30 tablet 2   carvedilol  (COREG ) 3.125 MG tablet Take 1 tablet (3.125 mg total) by mouth 2 (two) times daily with a meal. 30 tablet 1   No current facility-administered medications on file prior to visit.    Allergies:  Allergies  Allergen Reactions   Penicillins Other (See Comments)    Childhood allergy    Past Medical History:  Past Medical History:  Diagnosis Date   Cancer (HCC)    lung cancer   Diverticulosis    GERD (gastroesophageal reflux disease)  Hx of small bowel obstruction    Hyperlipidemia    Hypertension    Hypothyroidism    IBS (irritable bowel syndrome)    Substance abuse (HCC)    Alcoholic, Drug addition   Thyroid  disease    Past Surgical History:  Past Surgical History:  Procedure Laterality Date   arm surgery Right    BRONCHIAL BRUSHINGS  10/24/2019   Procedure: BRONCHIAL BRUSHINGS;  Surgeon: Shelah Lamar RAMAN, MD;  Location: [facility] ENDOSCOPY;  Service: Cardiopulmonary;;  right lower lobe    BRONCHIAL BRUSHINGS  11/05/2019   Procedure: BRONCHIAL BRUSHINGS;  Surgeon: Shelah Lamar RAMAN, MD;  Location: [facility] ENDOSCOPY;  Service: Pulmonary;;   BRONCHIAL NEEDLE ASPIRATION BIOPSY  10/24/2019   Procedure: BRONCHIAL NEEDLE ASPIRATION BIOPSIES;  Surgeon: Shelah Lamar RAMAN, MD;  Location: [facility] ENDOSCOPY;  Service: Cardiopulmonary;;   BRONCHIAL NEEDLE ASPIRATION BIOPSY  11/05/2019   Procedure: BRONCHIAL NEEDLE ASPIRATION BIOPSIES;  Surgeon: Shelah Lamar RAMAN, MD;  Location: [facility] ENDOSCOPY;  Service: Pulmonary;;   ENDOBRONCHIAL  ULTRASOUND N/A 10/24/2019   Procedure: ENDOBRONCHIAL ULTRASOUND;  Surgeon: Shelah Lamar RAMAN, MD;  Location: [facility] ENDOSCOPY;  Service: Cardiopulmonary;  Laterality: N/A;   FINGER SURGERY Right    Middle   IR IMAGING GUIDED PORT INSERTION  11/19/2019   VIDEO BRONCHOSCOPY N/A 10/24/2019   Procedure: VIDEO BRONCHOSCOPY WITHOUT FLUORO;  Surgeon: Shelah Lamar RAMAN, MD;  Location: [facility] ENDOSCOPY;  Service: Cardiopulmonary;  Laterality: N/A;   VIDEO BRONCHOSCOPY WITH ENDOBRONCHIAL NAVIGATION N/A 11/05/2019   Procedure: VIDEO BRONCHOSCOPY WITH ENDOBRONCHIAL NAVIGATION;  Surgeon: Shelah Lamar RAMAN, MD;  Location: [facility] ENDOSCOPY;  Service: Pulmonary;  Laterality: N/A;   Social History:  Social History   Socioeconomic History   Marital status: Married    Spouse name: Not on file   Number of children: 2   Years of education: Not on file   Highest education level: Not on file  Occupational History   Occupation: supervisor    Employer: [employer]  Tobacco Use   Smoking status: Former    Current packs/day: 1.50    Types: Cigarettes   Smokeless tobacco: Former    Types: Associate Professor status: Never Used  Substance and Sexual Activity   Alcohol use: No    Comment: Alcoholic clean for 6 years   Drug use: No    Comment: Recovering Drug Addict-clean for 6 years   Sexual activity: Not on file  Other Topics Concern   Not on file  Social History Narrative   Not on file   Social Drivers of Health   Financial Resource Strain: Medium Risk (10/31/2019)   Overall Financial Resource Strain (CARDIA)    Difficulty of Paying Living Expenses: Somewhat hard  Food Insecurity: No Food Insecurity (09/25/2023)   Hunger Vital Sign    Worried About Running Out of Food in the Last Year: Never true    Ran Out of Food in the Last Year: Never true  Transportation Needs: No Transportation Needs (09/25/2023)   PRAPARE - Administrator, Civil Service (Medical): No    Lack of Transportation (Non-Medical):  No  Physical Activity: Insufficiently Active (10/31/2019)   Exercise Vital Sign    Days of Exercise per Week: 5 days    Minutes of Exercise per Session: 20 min  Stress: Stress Concern Present (10/31/2019)   [employer] of Occupational Health - Occupational Stress Questionnaire    Feeling of Stress : Very much  Social Connections: Moderately Isolated (10/31/2019)   Social Connection  and Isolation Panel    Frequency of Communication with Friends and Family: More than three times a week    Frequency of Social Gatherings with Friends and Family: More than three times a week    Attends Religious Services: Never    Database administrator or Organizations: No    Attends Banker Meetings: Never    Marital Status: Married  Catering manager Violence: Not At Risk (09/25/2023)   Humiliation, Afraid, Rape, and Kick questionnaire    Fear of Current or Ex-Partner: No    Emotionally Abused: No    Physically Abused: No    Sexually Abused: No   Family History:  Family History  Problem Relation Age of Onset   Epilepsy Mother    Heart disease Mother    Heart disease Brother    Lung cancer Paternal Uncle     Review of Systems: Constitutional: Doesn't report fevers, chills or abnormal weight loss Eyes: Doesn't report blurriness of vision Ears, nose, mouth, throat, and face: Doesn't report sore throat Respiratory: Doesn't report cough, dyspnea or wheezes Cardiovascular: Doesn't report palpitation, chest discomfort  Gastrointestinal:  Doesn't report nausea, constipation, diarrhea GU: Doesn't report incontinence Skin: Doesn't report skin rashes Neurological: Per HPI Musculoskeletal: Doesn't report joint pain Behavioral/Psych: Doesn't report anxiety  Physical Exam: Wt Readings from Last 3 Encounters:  12/14/23 156 lb 1.6 oz (70.8 kg)  12/06/23 156 lb (70.8 kg)  11/16/23 159 lb (72.1 kg)   Temp Readings from Last 3 Encounters:  12/14/23 (!) 97.3 F (36.3 C)  12/07/23 97.9 F  (36.6 C) (Temporal)  12/06/23 (!) 97.3 F (36.3 C) (Temporal)   BP Readings from Last 3 Encounters:  12/14/23 97/70  12/07/23 122/74  12/06/23 115/79   Pulse Readings from Last 3 Encounters:  12/14/23 73  12/07/23 100  12/06/23 (!) 110     KPS: 70. General: Alert, cooperative, pleasant, in no acute distress Head: Normal EENT: No conjunctival injection or scleral icterus.  Lungs: Resp effort normal Cardiac: Regular rate Abdomen: Non-distended abdomen Skin: No rashes cyanosis or petechiae. Extremities: No clubbing or edema  Neurologic Exam: Mental Status: Awake, alert, attentive to examiner. Oriented to self and environment. Language is fluent with intact comprehension.  Cranial Nerves: Visual acuity is grossly normal. Visual fields are full. Extra-ocular movements intact. No ptosis. Face is symmetric Motor: Tone and bulk are normal. Power is 4/5 in right leg. Reflexes are symmetric, no pathologic reflexes present.  Dysmetria L>R Sensory: Intact to light touch Gait: Dystaxic, wide based   Labs: I have reviewed the data as listed    Component Value Date/Time   NA 127 (L) 12/06/2023 1520   NA 141 03/08/2017 1707   K 3.3 (L) 12/06/2023 1520   CL 92 (L) 12/06/2023 1520   CO2 27 12/06/2023 1520   GLUCOSE 108 (H) 12/06/2023 1520   BUN <5 (L) 12/06/2023 1520   BUN 12 03/08/2017 1707   CREATININE 0.56 (L) 12/06/2023 1520   CALCIUM  8.3 (L) 12/06/2023 1520   PROT 5.9 (L) 12/06/2023 1520   PROT 7.1 03/08/2017 1707   ALBUMIN  2.9 (L) 12/06/2023 1520   ALBUMIN  4.5 03/08/2017 1707   AST 10 (L) 12/06/2023 1520   ALT 5 12/06/2023 1520   ALKPHOS 102 12/06/2023 1520   BILITOT 0.9 12/06/2023 1520   GFRNONAA >60 12/06/2023 1520   GFRAA >60 01/06/2020 0837   Lab Results  Component Value Date   WBC 11.4 (H) 12/06/2023   NEUTROABS 8.3 (H) 12/06/2023  HGB 8.1 (L) 12/06/2023   HCT 25.5 (L) 12/06/2023   MCV 95.1 12/06/2023   PLT 426 (H) 12/06/2023   Imaging:  [facility] Clinician  Interpretation: I have personally reviewed the CNS images as listed.  My interpretation, in the context of the patient's clinical presentation, is progressive disease  CT CHEST ABDOMEN PELVIS W CONTRAST Result Date: 12/13/2023 CLINICAL DATA:  Restaging non-small cell lung cancer. * Tracking Code: BO * EXAM: CT CHEST, ABDOMEN, AND PELVIS WITH CONTRAST TECHNIQUE: Multidetector CT imaging of the chest, abdomen and pelvis was performed following the standard protocol during bolus administration of intravenous contrast. RADIATION DOSE REDUCTION: This exam was performed according to the departmental dose-optimization program which includes automated exposure control, adjustment of the mA and/or kV according to patient size and/or use of iterative reconstruction technique. CONTRAST:  OMNIPAQUE  IOHEXOL  300 MG/ML  SOLN COMPARISON:  CT scan 11/09/2023.  Prior PET-CT 11/04/2022 FINDINGS: CT CHEST FINDINGS Cardiovascular: Slightly larger pericardial effusion. Stable age advanced aortic and three-vessel coronary artery calcifications. The aorta is normal in caliber. No dissection. Mediastinum/Nodes: Small scattered mediastinal and hilar lymph nodes are unchanged. No new or progressive findings. 8 mm right hilar node is unchanged. The esophagus is grossly normal. Lungs/Pleura: Stable necrotic right lower lobe/right infrahilar mass filling the as ago esophageal recess. This measures approximately 3.9 x 3.8 cm and previously measured (when measured at the same level on the prior study) 3.8 x 3.7 cm. This is surrounded by drowned/obstructed right lower lobe. Interval enlargement of the right pleural effusion, approximately twice is large. I do not see any obvious enhancing pleural lesions. Stable underlying emphysematous changes and pulmonary scarring. No metastatic pulmonary nodules are identified. Musculoskeletal: Mild symmetric bilateral gynecomastia. There is an enlarged left axillary lymph node on image 12/2 which  measures 14 mm. This previously measured 10 mm. CT ABDOMEN PELVIS FINDINGS Hepatobiliary: 3 small low-attenuation liver lesions slightly increased in size and more conspicuous when compared to the prior studies. Age 55 mm lesion and segment 2 on image 58/2. 11 mm lesion in segment 4 B. 7 mm lesion adjacent to the gallbladder in segment 6. Findings suspicious for hepatic metastatic disease. MRI liver without and with contrast suggested for further evaluation. The liver contour is slightly irregular and the hepatic fissures are slightly prominent. Findings could suggest cirrhotic changes. No definite findings for portal venous hypertension. No splenomegaly. The gallbladder is unremarkable. No common bile duct dilatation. Pancreas: Unremarkable. No pancreatic ductal dilatation or surrounding inflammatory changes. Spleen: Normal in size without focal abnormality. Adrenals/Urinary Tract: The adrenal glands and kidneys are unremarkable. No bladder abnormalities. Stomach/Bowel: The stomach, duodenum, small bowel and colon are grossly normal without oral contrast. No inflammatory changes, mass lesions or obstructive findings. The appendix is normal. Vascular/Lymphatic: Age advanced atherosclerotic calcification involving the aorta and branch vessels. No aortic aneurysm or dissection. The left common iliac artery is occluded for several cm and is reconstituted distally. No enlarged abdominal or pelvic lymph nodes. Reproductive: The prostate gland and seminal vesicles are unremarkable. Other: No ascites or abdominal wall hernia. Musculoskeletal: No findings suspicious for osseous metastatic disease. IMPRESSION: 1. Stable necrotic right lower lobe/right infrahilar mass filling the azygoesophageal recess. Surrounding drowned right lower lobe. 2. Interval enlargement of the right pleural effusion, approximately twice is large. No obvious enhancing pleural lesions. 3. Slight interval enlargement of a left axillary lymph node. 4.  3 small low-attenuation liver lesions slightly increased in size and more conspicuous when compared to the prior studies. Findings suspicious  for hepatic metastatic disease. MRI liver without and with contrast suggested for further evaluation. 5. Age advanced atherosclerotic calcification involving the aorta and branch vessels. The left common iliac artery is occluded for several cm and is reconstituted distally. 6. Aortic atherosclerosis. Aortic Atherosclerosis (ICD10-I70.0). Electronically Signed   By: MYRTIS Stammer M.D.   On: 12/13/2023 16:43   MR BRAIN W WO CONTRAST Result Date: 12/07/2023 EXAM: MRI BRAIN WITH AND WITHOUT CONTRAST 12/07/2023 01:03:31 PM TECHNIQUE: Multiplanar multisequence MRI of the head/brain was performed with and without the administration of intravenous contrast. COMPARISON: MRI head 09/11/23 and earlier. CLINICAL HISTORY: Brain/CNS neoplasm, assess treatment response. SRS Brain F/U; Pt is on O2 ; Port accessed by 315 RN; Port de-accessed by [employer]; 8 ml Vueway ; 2 ml wasted FINDINGS: BRAIN AND VENTRICLES: New lesions: -- New 2 mm focus of enhancement within the anterior right frontal lobe series 13 image 128. -- Multiple additional punctate foci of enhancement in the right frontal lobe measuring up to 2 mm which are new from prior series 13 image 139. -- New punctate foci of enhancement within the left frontal lobe on series 13 image 146 and 148. -- Additional new areas of punctate enhancement in the left frontal lobe series 13 image 100. -- New punctate enhancement in the left temporal lobe series 13 image 83. Larger lesions: -- 9 mm lesion in the left occipital lobe, previously 7 mm series 13 image 100. -- 3 mm lesion in the lateral right temporal lobe at the site of previous punctate enhancement series 13 image 84. Stable/similar lesions: -- Redemonstrated enhancing metastatic lesion in the left cerebellum which measures 2.8 x 2.1 x 1.9 cm, previously 2.8 x 2.0 x 1.9 cm. There is  surrounding vasogenic edema throughout the left cerebral hemisphere partially extending into the cerebellar vermis and extending into the left middle cerebellar peduncle similar to prior. There are similar associated chronic blood products within the left cerebellum. -- Similar appearance of 7 mm cortically-based lesion within the posterior right frontal lobe seen on series 14 image 140. -- Additional punctate lesion in the left cerebellum is unchanged seen on series 13 image 65. -- 8 mm lesion in the high left parietal lobe is unchanged series 13 image 150. -- 5 mm lesion in the right occipital lobe which appears unchanged series 13 image 90. -- Clustered foci of enhancement in the parasagittal occipital lobes along the posterior falx which are overall similar in appearance series 13 image 82. -- 4 mm lesion in the anterior left frontal lobe is unchanged series 13 image 103. Decreased lesions: -- Punctate focus of enhancement in the left occipital lobe is decreased in size previously measuring up to 5 mm series 13 image 97. -- Additional 8 mm lesion more inferiorly within the left occipital lobe, previously 10 mm series 13 image 93. -- 8 mm focus of confluent adjacent lesions in the right temporal lobe previously measuring up to 10 mm series 13 image 90. No acute infarct. No midline shift. Mild parenchymal volume loss. Chronic microvascular ischemic changes. ORBITS: No acute abnormality. SINUSES: Mucosal thickening in the right maxillary sinus. BONES AND SOFT TISSUES: Mastoid effusions, left greater than right. IMPRESSION: 1. Interval development of multiple new punctate enhancing lesions in the bilateral frontal lobes and left temporal lobe. 2. Lesions in the left occipital lobe and lateral right temporal lobe are increased in size. 3. Additional stable and decreased lesions as above. 4. Similar vasogenic edema in the cerebellum extending into the left middle cerebellar  peduncle. Electronically signed by: Donnice Mania MD 12/07/2023 08:24 PM EDT RP Workstation: [id number]    Assessment/Plan Malignant neoplasm of lung metastatic to brain [facility]) [C34.90, C79.31]  RUFINO STAUP is clinically stable today from focal neurologic standpoint.  MRI brain demonstrates progression of disease, with several novel enhancing foci c/w metastases.  We had an extensive goals of care discussion at bedside.  He is not interested in proceeding with additional radiation at this time.  He would like to meet with his primary oncology team next week and discuss further treatment vs hospice consideration.  Recommended he reach out to us  if he changes his mind regarding CNS interventions.  These lesions are all quite small, SRS could be delayed for 2-3 months potentially without additional harm.  Will con't to follow with Dr. Sherrod for further goals of care next week.  We ask that Joseph Atkins return to clinic as needed.  We appreciate the opportunity to participate in the care of Joseph Atkins.   All questions were answered. The patient knows to call the clinic with any problems, questions or concerns. No barriers to learning were detected.  The total time spent in the encounter was 40 minutes and more than 50% was on counseling and review of test results   Arthea MARLA Manns, MD Medical Director of Neuro-Oncology [facility] 12/14/23 12:35 PM

## 2023-12-16 NOTE — Progress Notes (Unsigned)
 [facility] OFFICE PROGRESS NOTE  [name], NP [address]  [address] [place] [address]  DIAGNOSIS:   Stage IV (T3, N0, M1C) non-small cell lung cancer, adenocarcinoma.  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 He was diagnosed in July 2021.   Molecular Biomarkers:  MSI-High DETECTED Pembrolizumab  Atezolizumab, Avelumab, Cemiplimab, Dostarlimab, Durvalumab, Ipilimumab, Nivolumab   STK11Splice Site SNV 1.9% Everolimus, Temsirolimus Yes   [id number] 1.7% Binimetinib Yes   [id number] 0.4%   Niraparib, Olaparib, Rucaparib, Talazoparib, Tazemetostat Yes  PRIOR THERAPY: 1) SRS to the solitary brain metastasis under the care of Dr. Dewey. Last treatment 11/14/19. 2) Weekly concurrent chemoradiation with carboplatin  for an AUC of 2, paclitaxel  45 mg/m2.  First dose expected on 11/25/2019. Status post 7 cycles, last dose was given 01/06/2020 with partial response.  3)  Immunotherapy with Keytruda  200 mg IV every 3 weeks.  First dose February 10, 2020 for a patient with MSI high.  Status post 35  cycles. 4) Avastin  15 mg/KG every 3 weeks.  First dose today for the vasogenic edema of the brain.S/P 7 cycles. 5) The patient had evidence for local disease recurrence in July 2024. 6) SRS to the occipital brain lesion under the care of Dr. Dewey on 12/22/22 7)  UHRT under the care of Dr. Dewey, last dose expected on 03/03/23 8) SRS to the new metastatic brain lesion on 03/07/23 9) Resuming his treatment with Keytruda  200 Mg IV every 3 weeks. First dose 11/16/2022. Status post 10 cycles  10) SRS to the new metastatic brain lesions under the care of Dr. Dewey, completed on 06/21/2023 11) Palliative systemic chemotherapy with carboplatin  for an AUC of 5, alimta  500 mg/m, and Keytruda . First dose on 06/28/23. He is status post 5 cycles. His dose of carboplatin  will be reduced starting from  cycle #4 to AUC of 4 and alimta  400 mg/m2 due to intolerance.    CURRENT THERAPY:  Hospice  INTERVAL HISTORY: Joseph Atkins 63 y.o. male returns to the clinic today for a follow up visit accompanied by his wife. The patient was last seen by Dr. Sherrod on 12/06/23.   When the patient was last seen, he was having significant fatigue and lack of energy. He also was endorsing generalized pain in the legs, knees, shin, bones, and neck. He was considering stopping treatment. He did not get treatment that day. Dr. Sherrod arranged restaging CT scan.   With cycle #5 (which was given in July) he was only on single agent dose reduced alimta  and had cytopenia's requiring blood transfusion. He did not feel any appreciable improvement in his symptoms with the blood transfusion.   Despite being off treatment for the last few weeks, he continues to endorse similar symptoms.   Of note, he also had a brain MRI performed and met with Dr. Buckley. His brain MRI showed progressive disease. He was going to meet with radiation oncology to discuss radiation but he was not interested. He wanted to have a goals of care discussion at his appointment.   He talked to his family about continuing treatment. He states he initially decided if there was a way to modify the doses, he would want to continue treatment to appease his family. He doesn't want to be perceived as giving up. As previously mentioned, his does of carbo and alimta  was reduced from cycle #4. From cycle 5, he only proceeded  with dose reduced alimta  and continued to have a lot of cytopenias and weakness.   The fatigue is debilitating, preventing him from performing daily activities such as making the bed, doing laundry, or mopping. He spends most of his day lying on the couch watching TV.  He reports increased shortness of breath and has a history of fluid accumulation in his lungs, which was previously drained during a hospital stay. He had mild  improvement in his breathing after but not enough to warrant undergoing the procedure again at this time if needed. He experiences a persistent morning cough, which is not relieved by over-the-counter medications like Delsym  or Robitussin. He also has hycodan. No hemoptysis or visible bleeding.  He experiences nausea, particularly in the mornings, and often vomits after eating. He takes Compazine  and Zofran  for nausea, but still struggles to keep food down. He has lost weight and is eating small portions, primarily fruits like watermelon and cantaloupe. He avoids certain foods, such as hamburgers, to prevent vomiting.  He reports issues with bowel movements, having experienced constipation followed by diarrhea after taking Miralax  and a stool softener. He is currently trying to adjust his regimen to manage these symptoms.  He notes swelling in his ankles and feet. He experiences pain in his shins. He drinks water frequently and urinates often, but his blood pressure is low, which limits the use of diuretics. His protein is low. He does not use compression stockings due to likely having trouble getting them on. He is not currently drinking protein supplemental drinks but has in the past.   He recently had a restaging CT scan performed.   He is here for evaluation and repeat blood work before considering undergoing cycle #6.    MEDICAL HISTORY: Past Medical History:  Diagnosis Date   Cancer (HCC)    lung cancer   Diverticulosis    GERD (gastroesophageal reflux disease)    Hx of small bowel obstruction    Hyperlipidemia    Hypertension    Hypothyroidism    IBS (irritable bowel syndrome)    Substance abuse (HCC)    Alcoholic, Drug addition   Thyroid  disease     ALLERGIES:  is allergic to penicillins.  MEDICATIONS:  Current Outpatient Medications  Medication Sig Dispense Refill   acetaminophen  (TYLENOL ) 500 MG tablet Take 1,000 mg by mouth every 6 (six) hours as needed for mild pain  (pain score 1-3).     albuterol  (PROVENTIL  HFA;VENTOLIN  HFA) 108 (90 Base) MCG/ACT inhaler Inhale 2 puffs into the lungs every 6 (six) hours as needed for wheezing or shortness of breath. 1 Inhaler 0   carvedilol  (COREG ) 3.125 MG tablet Take 1 tablet (3.125 mg total) by mouth 2 (two) times daily with a meal. 30 tablet 1   [facility] ASPIRIN  LOW DOSE 81 MG tablet TAKE 1 TABLET (81 MG TOTAL) BY MOUTH DAILY. SWALLOW WHOLE. 90 tablet 3   DULoxetine  (CYMBALTA ) 20 MG capsule Take 1 capsule (20 mg total) by mouth 2 (two) times daily. 60 capsule 5   fluticasone  furoate-vilanterol (BREO ELLIPTA ) 100-25 MCG/INH AEPB Inhale 1 puff into the lungs daily. 30 each 3   folic acid  (FOLVITE ) 1 MG tablet TAKE 1 TABLET BY MOUTH EVERY DAY 90 tablet 0   HYDROcodone  bit-homatropine (HYCODAN) 5-1.5 MG/5ML syrup Take 5 mLs by mouth every 6 (six) hours as needed for cough. 120 mL 0   hydrOXYzine  (ATARAX ) 25 MG tablet Take 25 mg by mouth daily.     ipratropium (ATROVENT ) 0.02 %  nebulizer solution Take 2.5 mLs (0.5 mg total) by nebulization 3 (three) times daily. 75 mL 12   levalbuterol  (XOPENEX ) 0.63 MG/3ML nebulizer solution Take 3 mLs (0.63 mg total) by nebulization 3 (three) times daily. 3 mL 12   levothyroxine  (SYNTHROID ) 100 MCG tablet TAKE 1 TABLET BY MOUTH EVERY DAY 90 tablet 1   loratadine (CLARITIN) 10 MG tablet Take 10 mg by mouth daily.     LORazepam  (ATIVAN ) 1 MG tablet Take 1 tablet (1 mg total) by mouth once as needed for up to 1 dose for anxiety (MRI claustrophobia). 3 tablet 0   lumateperone  tosylate (CAPLYTA ) 10.5 MG capsule Take 42 mg by mouth at bedtime.     mupirocin  ointment (BACTROBAN ) 2 % APPLY TO AFFECTED AREA TWICE A DAY 22 g 0   omeprazole  (PRILOSEC) 40 MG capsule Take 1 capsule (40 mg total) by mouth daily. 90 capsule 4   ondansetron  (ZOFRAN ) 4 MG tablet Take 1 tablet (4 mg total) by mouth every 8 (eight) hours as needed. 40 tablet 2   pembrolizumab  (KEYTRUDA ) 100 MG/4ML SOLN Inject 2 mg/kg into the vein  every 21 ( twenty-one) days.     polyethylene glycol powder (MIRALAX ) powder Take 17 g by mouth daily. 255 g 11   potassium chloride  SA (KLOR-CON  M) 20 MEQ tablet Take 1 tablet (20 mEq total) by mouth daily. 5 tablet 0   prochlorperazine  (COMPAZINE ) 10 MG tablet Take 1 tablet (10 mg total) by mouth every 6 (six) hours as needed. 30 tablet 2   No current facility-administered medications for this visit.    SURGICAL HISTORY:  Past Surgical History:  Procedure Laterality Date   arm surgery Right    BRONCHIAL BRUSHINGS  10/24/2019   Procedure: BRONCHIAL BRUSHINGS;  Surgeon: Shelah Lamar RAMAN, MD;  Location: [facility] END[facility]COPY;  Service: Cardiopulmonary;;  right lower lobe    BRONCHIAL BRUSHINGS  11/05/2019   Procedure: BRONCHIAL BRUSHINGS;  Surgeon: Shelah Lamar RAMAN, MD;  Location: [facility] ENDOSCOPY;  Service: Pulmonary;;   BRONCHIAL NEEDLE ASPIRATION BIOPSY  10/24/2019   Procedure: BRONCHIAL NEEDLE ASPIRATION BIOPSIES;  Surgeon: Shelah Lamar RAMAN, MD;  Location: [facility] ENDOSCOPY;  Service: Cardiopulmonary;;   BRONCHIAL NEEDLE ASPIRATION BIOPSY  11/05/2019   Procedure: BRONCHIAL NEEDLE ASPIRATION BIOPSIES;  Surgeon: Shelah Lamar RAMAN, MD;  Location: [facility] ENDOSCOPY;  Service: Pulmonary;;   ENDOBRONCHIAL ULTRASOUND N/A 10/24/2019   Procedure: ENDOBRONCHIAL ULTRASOUND;  Surgeon: Shelah Lamar RAMAN, MD;  Location: [facility] ENDOSCOPY;  Service: Cardiopulmonary;  Laterality: N/A;   FINGER SURGERY Right    Middle   IR IMAGING GUIDED PORT INSERTION  11/19/2019   VIDEO BRONCHOSCOPY N/A 10/24/2019   Procedure: VIDEO BRONCHOSCOPY WITHOUT FLUORO;  Surgeon: Shelah Lamar RAMAN, MD;  Location: [facility] ENDOSCOPY;  Service: Cardiopulmonary;  Laterality: N/A;   VIDEO BRONCHOSCOPY WITH ENDOBRONCHIAL NAVIGATION N/A 11/05/2019   Procedure: VIDEO BRONCHOSCOPY WITH ENDOBRONCHIAL NAVIGATION;  Surgeon: Shelah Lamar RAMAN, MD;  Location: [facility] ENDOSCOPY;  Service: Pulmonary;  Laterality: N/A;    REVIEW OF SYSTEMS:   Review of Systems  Constitutional: Positive for  fatigue, decreased appetite, and weight loss. Negative for chills and fever.   HENT: Negative for mouth sores, nosebleeds, sore throat and trouble swallowing.   Eyes: Negative for eye problems and icterus.  Respiratory: Positive for cough and dyspnea. Negative for hemoptysis,  and wheezing.   Cardiovascular: Positive for bilateral lower extremity swelling. Negative for chest pain.  Gastrointestinal: Positive for nausea/vomiting. Negative for constipation. Genitourinary: Negative for bladder incontinence, difficulty urinating, dysuria, frequency and hematuria.  Musculoskeletal: Negative for back pain, gait problem, neck pain and neck stiffness.  Skin: Negative for itching and rash.  Neurological: Positive for generalized weakness. Negative for dizziness, headaches, light-headedness and seizures.  Hematological: Negative for adenopathy. Does not bruise/bleed easily.  Psychiatric/Behavioral: Negative for confusion, depression and sleep disturbance. The patient is not nervous/anxious.     PHYSICAL EXAMINATION:  Blood pressure 95/77, pulse (!) 118, temperature 98 F (36.7 C), temperature source Temporal, resp. rate 13, weight 154 lb 8 oz (70.1 kg), SpO2 98%.  ECOG PERFORMANCE STATUS: 2-3  Physical Exam  Constitutional: Oriented to person, place, and time and chronically ill appearing male, and in no distress.  HENT:  Head: Normocephalic and atraumatic.  Mouth/Throat: Oropharynx is clear and moist. No oropharyngeal exudate.  Eyes: Conjunctivae are normal. Right eye exhibits no discharge. Left eye exhibits no discharge. No scleral icterus.  Neck: Normal range of motion. Neck supple.  Cardiovascular: Tachycardia, regular rhythm, normal heart sounds and intact distal pulses.   Pulmonary/Chest: Effort normal and breath sounds normal. No respiratory distress. No wheezes. No rales.  Abdominal: Soft. Bowel sounds are normal. Exhibits no distension and no mass. There is no tenderness.   Musculoskeletal: Normal range of motion. Exhibits mild bilateral ankle edema.  Lymphadenopathy:    No cervical adenopathy.  Neurological: Alert and oriented to person, place, and time. Exhibits muscle wasting. Examined in the wheelchair.  Skin: Skin is warm and dry. No rash noted. Not diaphoretic. No erythema. No pallor.  Psychiatric: Mood, memory and judgment normal.  Vitals reviewed.  LABORATORY DATA: Lab Results  Component Value Date   WBC 16.5 (H) 12/19/2023   HGB 9.4 (L) 12/19/2023   HCT 29.3 (L) 12/19/2023   MCV 94.5 12/19/2023   PLT 485 (H) 12/19/2023      Chemistry      Component Value Date/Time   NA 128 (L) 12/19/2023 1015   NA 141 03/08/2017 1707   K 3.2 (L) 12/19/2023 1015   CL 91 (L) 12/19/2023 1015   CO2 30 12/19/2023 1015   BUN <5 (L) 12/19/2023 1015   BUN 12 03/08/2017 1707   CREATININE 0.61 12/19/2023 1015      Component Value Date/Time   CALCIUM  8.0 (L) 12/19/2023 1015   ALKPHOS 100 12/19/2023 1015   AST 12 (L) 12/19/2023 1015   ALT 5 12/19/2023 1015   BILITOT 0.8 12/19/2023 1015       RADIOGRAPHIC STUDIES:  CT CHEST ABDOMEN PELVIS W CONTRAST Result Date: 12/13/2023 CLINICAL DATA:  Restaging non-small cell lung cancer. * Tracking Code: BO * EXAM: CT CHEST, ABDOMEN, AND PELVIS WITH CONTRAST TECHNIQUE: Multidetector CT imaging of the chest, abdomen and pelvis was performed following the standard protocol during bolus administration of intravenous contrast. RADIATION DOSE REDUCTION: This exam was performed according to the departmental dose-optimization program which includes automated exposure control, adjustment of the mA and/or kV according to patient size and/or use of iterative reconstruction technique. CONTRAST:  OMNIPAQUE  IOHEXOL  300 MG/ML  SOLN COMPARISON:  CT scan 11/09/2023.  Prior PET-CT 11/04/2022 FINDINGS: CT CHEST FINDINGS Cardiovascular: Slightly larger pericardial effusion. Stable age advanced aortic and three-vessel coronary artery  calcifications. The aorta is normal in caliber. No dissection. Mediastinum/Nodes: Small scattered mediastinal and hilar lymph nodes are unchanged. No new or progressive findings. 8 mm right hilar node is unchanged. The esophagus is grossly normal. Lungs/Pleura: Stable necrotic right lower lobe/right infrahilar mass filling the as ago esophageal recess. This measures approximately 3.9 x 3.8 cm and previously measured (when  measured at the same level on the prior study) 3.8 x 3.7 cm. This is surrounded by drowned/obstructed right lower lobe. Interval enlargement of the right pleural effusion, approximately twice is large. I do not see any obvious enhancing pleural lesions. Stable underlying emphysematous changes and pulmonary scarring. No metastatic pulmonary nodules are identified. Musculoskeletal: Mild symmetric bilateral gynecomastia. There is an enlarged left axillary lymph node on image 12/2 which measures 14 mm. This previously measured 10 mm. CT ABDOMEN PELVIS FINDINGS Hepatobiliary: 3 small low-attenuation liver lesions slightly increased in size and more conspicuous when compared to the prior studies. Age 81 mm lesion and segment 2 on image 58/2. 11 mm lesion in segment 4 B. 7 mm lesion adjacent to the gallbladder in segment 6. Findings suspicious for hepatic metastatic disease. MRI liver without and with contrast suggested for further evaluation. The liver contour is slightly irregular and the hepatic fissures are slightly prominent. Findings could suggest cirrhotic changes. No definite findings for portal venous hypertension. No splenomegaly. The gallbladder is unremarkable. No common bile duct dilatation. Pancreas: Unremarkable. No pancreatic ductal dilatation or surrounding inflammatory changes. Spleen: Normal in size without focal abnormality. Adrenals/Urinary Tract: The adrenal glands and kidneys are unremarkable. No bladder abnormalities. Stomach/Bowel: The stomach, duodenum, small bowel and colon are  grossly normal without oral contrast. No inflammatory changes, mass lesions or obstructive findings. The appendix is normal. Vascular/Lymphatic: Age advanced atherosclerotic calcification involving the aorta and branch vessels. No aortic aneurysm or dissection. The left common iliac artery is occluded for several cm and is reconstituted distally. No enlarged abdominal or pelvic lymph nodes. Reproductive: The prostate gland and seminal vesicles are unremarkable. Other: No ascites or abdominal wall hernia. Musculoskeletal: No findings suspicious for osseous metastatic disease. IMPRESSION: 1. Stable necrotic right lower lobe/right infrahilar mass filling the azygoesophageal recess. Surrounding drowned right lower lobe. 2. Interval enlargement of the right pleural effusion, approximately twice is large. No obvious enhancing pleural lesions. 3. Slight interval enlargement of a left axillary lymph node. 4. 3 small low-attenuation liver lesions slightly increased in size and more conspicuous when compared to the prior studies. Findings suspicious for hepatic metastatic disease. MRI liver without and with contrast suggested for further evaluation. 5. Age advanced atherosclerotic calcification involving the aorta and branch vessels. The left common iliac artery is occluded for several cm and is reconstituted distally. 6. Aortic atherosclerosis. Aortic Atherosclerosis (ICD10-I70.0). Electronically Signed   By: MYRTIS Stammer M.D.   On: 12/13/2023 16:43   MR BRAIN W WO CONTRAST Result Date: 12/07/2023 EXAM: MRI BRAIN WITH AND WITHOUT CONTRAST 12/07/2023 01:03:31 PM TECHNIQUE: Multiplanar multisequence MRI of the head/brain was performed with and without the administration of intravenous contrast. COMPARISON: MRI head 09/11/23 and earlier. CLINICAL HISTORY: Brain/CNS neoplasm, assess treatment response. SRS Brain F/U; Pt is on O2 ; Port accessed by 315 RN; Port de-accessed by [employer]; 8 ml Vueway ; 2 ml wasted FINDINGS: BRAIN AND  VENTRICLES: New lesions: -- New 2 mm focus of enhancement within the anterior right frontal lobe series 13 image 128. -- Multiple additional punctate foci of enhancement in the right frontal lobe measuring up to 2 mm which are new from prior series 13 image 139. -- New punctate foci of enhancement within the left frontal lobe on series 13 image 146 and 148. -- Additional new areas of punctate enhancement in the left frontal lobe series 13 image 100. -- New punctate enhancement in the left temporal lobe series 13 image 83. Larger lesions: -- 9 mm lesion in  the left occipital lobe, previously 7 mm series 13 image 100. -- 3 mm lesion in the lateral right temporal lobe at the site of previous punctate enhancement series 13 image 84. Stable/similar lesions: -- Redemonstrated enhancing metastatic lesion in the left cerebellum which measures 2.8 x 2.1 x 1.9 cm, previously 2.8 x 2.0 x 1.9 cm. There is surrounding vasogenic edema throughout the left cerebral hemisphere partially extending into the cerebellar vermis and extending into the left middle cerebellar peduncle similar to prior. There are similar associated chronic blood products within the left cerebellum. -- Similar appearance of 7 mm cortically-based lesion within the posterior right frontal lobe seen on series 14 image 140. -- Additional punctate lesion in the left cerebellum is unchanged seen on series 13 image 65. -- 8 mm lesion in the high left parietal lobe is unchanged series 13 image 150. -- 5 mm lesion in the right occipital lobe which appears unchanged series 13 image 90. -- Clustered foci of enhancement in the parasagittal occipital lobes along the posterior falx which are overall similar in appearance series 13 image 82. -- 4 mm lesion in the anterior left frontal lobe is unchanged series 13 image 103. Decreased lesions: -- Punctate focus of enhancement in the left occipital lobe is decreased in size previously measuring up to 5 mm series 13 image 97.  -- Additional 8 mm lesion more inferiorly within the left occipital lobe, previously 10 mm series 13 image 93. -- 8 mm focus of confluent adjacent lesions in the right temporal lobe previously measuring up to 10 mm series 13 image 90. No acute infarct. No midline shift. Mild parenchymal volume loss. Chronic microvascular ischemic changes. ORBITS: No acute abnormality. SINUSES: Mucosal thickening in the right maxillary sinus. BONES AND SOFT TISSUES: Mastoid effusions, left greater than right. IMPRESSION: 1. Interval development of multiple new punctate enhancing lesions in the bilateral frontal lobes and left temporal lobe. 2. Lesions in the left occipital lobe and lateral right temporal lobe are increased in size. 3. Additional stable and decreased lesions as above. 4. Similar vasogenic edema in the cerebellum extending into the left middle cerebellar peduncle. Electronically signed by: Donnice Mania MD 12/07/2023 08:24 PM EDT RP Workstation: [id number]     ASSESSMENT/PLAN:  This is a very pleasant 63 year old Caucasian male diagnosed with stage IV non-small cell lung cancer, adenocarcinoma.  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 He was diagnosed in July 2021. His molecular studies by [facility] show he has MSI high which is a good marker for response to immunotherapy which will be important for future treatment.     The patient will complete SRS to the solidary brain metastasis under the care of Dr. Dewey later today on 11/14/19.    He then underwent a course of weekly concurrent chemoradiation with carboplatin  for an AUC of 2 and paclitaxel  45 mg per metered squared.  He tolerated this treatment well except for fatigue and mild odynophagia.    The patient showed evidence of disease progression with an increase in volume of the right infrahilar mass.  Since the patient has MSI high, the patient then underwent treatment  with single agent immunotherapy with Keytruda  200 mg IV every 3 weeks.   The patient is status post 35 cycles and completed his 2 years of treatment on 03/23/22.  He also received 7 cycles of Avastin  for vasogenic edema in the brain.  Avastin  has been on  hold unless needed again in the future.     He has SRS to an occipital brain lesion on 12/22/22 by Dr. Dewey   He had evidence of progression on his surveillance imaging from July 2024. Therefore, he resumed immunotherapy with Keytruda . He is status post 9 cycles.    He underwent UHRT in the last day was on 03/03/2023 for some disease progression that was seen at that time. He is also was found to have new metastatic disease to the brain and had SRS on 03/07/23.    Unfortunately, the patient recently had continued CNS progression.  He is undergoing SRS under the care of Dr. Dewey in the last day was on 06/21/2023.  In light of the progression in the brain, Dr. Sherrod and the patient are in agreement to try to add on systemic chemotherapy with carboplatin  for an AUC of 5, Alimta  500 mg/m, and Keytruda  200 mg IV every 3 weeks to see if it helps with his progression in the brain.  Status post 5 cycles.  He has had some cytopenias requiring supportive care. He has been endorsing fatigue and weakness.   He recently had a brain MRI which showed progressive metastatic disease to the brain. The patient was not interested in pursuing treatment.    The patient was seen with Dr. Sherrod today.  Dr. Sherrod personally and independently reviewed the scan and discussed results with the patient today.  The scan showed progression.  All other treatment options would be chemotherapy. Concern with his quality of life as the other chemotherapy options have more side effects than the single agent alimta  he was on at reduced dose which he had intolerance too.    Dr. Sherrod had a lengthy discussion with the patient today about his current condition. Discussed hospice.  The patient is agreeable.   Will reach out to [facility].   I will cancel his future appointments and notify neuro-oncology.   The patient is not interested in pursuing a therapeutic thoracentesis at this time which may help his cough and shortness of breath. He did not have significant enough improvement in his breathing last time for benefit of the discomfort of the procedure.   Of note, the patient has a port-a-cath.     The patient was advised to call immediately if he has any concerning symptoms in the interval. The patient voices understanding of current disease status and treatment options and is in agreement with the current care plan. All questions were answered. The patient knows to call the clinic with any problems, questions or concerns. We can certainly see the patient much sooner if necessary      Orders Placed This Encounter  Procedures   Ambulatory referral to Hospice    Referral Priority:   Routine    Referral Type:   Consultation    Referral Reason:   Specialty Services Required    Requested Specialty:   Hospice Services    Number of Visits Requested:   1      Eswin Worrell L Jleigh Striplin, PA-C 12/19/23  ADDENDUM: Hematology/Oncology Attending: I had a face-to-face encounter with the patient today.  I reviewed his record, lab, scan and recommended his care plan.  This is a very pleasant 63 years old white male with stage IV non-small cell lung cancer, adenocarcinoma diagnosed in July 2021 with MSI high and no other actionable mutations.  He was treated with SRS to solitary brain metastasis at the time of diagnosis followed by a course  of concurrent chemoradiation for the locally advanced disease in the right chest followed by immunotherapy with Keytruda  200 Mg IV every 3 weeks for a total of 2 years.  The patient had evidence for disease progression in July 2024 in addition to multiple brain metastasis treated with SRS.  He resumed his treatment with  single agent Keytruda  initially every 3 weeks for 10 cycle but has further evidence for disease progression in the brain and we added systemic chemotherapy initially with carboplatin  and Alimta  every 3 weeks for 4 cycles and starting cycle #5 he was on maintenance treatment with Alimta  and Keytruda  and then single agent Alimta .  Unfortunately patient continues to have evidence for disease progression in the brain and most recent CT scan of the chest, abdomen pelvis performed recently showed evidence for disease progression with local disease recurrence in the right lung in addition to left axillary lymphadenopathy and suspicious liver metastasis. The patient feels significantly tired and fatigued and would not be able to resume any systemic therapy at this point. I had a lengthy discussion with the patient today about his current disease status and treatment options.  I offered him second line treatment with docetaxel and ramucirumab but this regimen would be harsh on the patient with his current condition.  He was also given the option of palliative care on hospice referral.  After discussion with the patient and his family he was interested on the hospice referral. We will see him on as-needed basis at this point. We will refer him to the hospice service and his county. Will continue to see the patient and assist in his management on as-needed basis. Disclaimer: This note was dictated with voice recognition software. Similar sounding words can inadvertently be transcribed and may be missed upon review. The total time spent in the appointment was 30 minutes including review of chart and various tests results, discussions about plan of care and coordination of care plan .  Sherrod MARLA Sherrod, MD

## 2023-12-19 ENCOUNTER — Inpatient Hospital Stay

## 2023-12-19 ENCOUNTER — Inpatient Hospital Stay (HOSPITAL_BASED_OUTPATIENT_CLINIC_OR_DEPARTMENT_OTHER): Admitting: Physician Assistant

## 2023-12-19 ENCOUNTER — Other Ambulatory Visit: Payer: Self-pay | Admitting: Physician Assistant

## 2023-12-19 VITALS — BP 95/77 | HR 118 | Temp 98.0°F | Resp 13 | Wt 154.5 lb

## 2023-12-19 DIAGNOSIS — D649 Anemia, unspecified: Secondary | ICD-10-CM

## 2023-12-19 DIAGNOSIS — E876 Hypokalemia: Secondary | ICD-10-CM

## 2023-12-19 DIAGNOSIS — C3490 Malignant neoplasm of unspecified part of unspecified bronchus or lung: Secondary | ICD-10-CM

## 2023-12-19 DIAGNOSIS — Z95828 Presence of other vascular implants and grafts: Secondary | ICD-10-CM

## 2023-12-19 DIAGNOSIS — C7931 Secondary malignant neoplasm of brain: Secondary | ICD-10-CM

## 2023-12-19 LAB — CMP (CANCER CENTER ONLY)
ALT: 5 U/L (ref 0–44)
AST: 12 U/L — ABNORMAL LOW (ref 15–41)
Albumin: 2.8 g/dL — ABNORMAL LOW (ref 3.5–5.0)
Alkaline Phosphatase: 100 U/L (ref 38–126)
Anion gap: 7 (ref 5–15)
BUN: <5 mg/dL — ABNORMAL LOW (ref 8–23)
CO2: 30 mmol/L (ref 22–32)
Calcium: 8 mg/dL — ABNORMAL LOW (ref 8.9–10.3)
Chloride: 91 mmol/L — ABNORMAL LOW (ref 98–111)
Creatinine: 0.61 mg/dL (ref 0.61–1.24)
GFR, Estimated: >60 mL/min (ref >60)
Glucose, Bld: 111 mg/dL — ABNORMAL HIGH (ref 70–99)
Potassium: 3.2 mmol/L — ABNORMAL LOW (ref 3.5–5.1)
Sodium: 128 mmol/L — ABNORMAL LOW (ref 135–145)
Total Bilirubin: 0.8 mg/dL (ref 0.0–1.2)
Total Protein: 5.6 g/dL — ABNORMAL LOW (ref 6.5–8.1)

## 2023-12-19 LAB — CBC WITH DIFFERENTIAL (CANCER CENTER ONLY)
Abs Immature Granulocytes: 0.36 K/uL — ABNORMAL HIGH (ref 0.00–0.07)
Basophils Absolute: 0.1 K/uL (ref 0.0–0.1)
Basophils Relative: 0 %
Eosinophils Absolute: 0 K/uL (ref 0.0–0.5)
Eosinophils Relative: 0 %
HCT: 29.3 % — ABNORMAL LOW (ref 39.0–52.0)
Hemoglobin: 9.4 g/dL — ABNORMAL LOW (ref 13.0–17.0)
Immature Granulocytes: 2 %
Lymphocytes Relative: 8 %
Lymphs Abs: 1.3 K/uL (ref 0.7–4.0)
MCH: 30.3 pg (ref 26.0–34.0)
MCHC: 32.1 g/dL (ref 30.0–36.0)
MCV: 94.5 fL (ref 80.0–100.0)
Monocytes Absolute: 1.9 K/uL — ABNORMAL HIGH (ref 0.1–1.0)
Monocytes Relative: 11 %
Neutro Abs: 12.9 K/uL — ABNORMAL HIGH (ref 1.7–7.7)
Neutrophils Relative %: 79 %
Platelet Count: 485 K/uL — ABNORMAL HIGH (ref 150–400)
RBC: 3.1 MIL/uL — ABNORMAL LOW (ref 4.22–5.81)
RDW: 16.7 % — ABNORMAL HIGH (ref 11.5–15.5)
WBC Count: 16.5 K/uL — ABNORMAL HIGH (ref 4.0–10.5)
nRBC: 0 % (ref 0.0–0.2)

## 2023-12-19 LAB — SAMPLE TO BLOOD BANK

## 2023-12-19 MED ORDER — SODIUM CHLORIDE 0.9% FLUSH
10.0000 mL | INTRAVENOUS | Status: DC | PRN
  Administered 2023-12-19: 10 mL

## 2023-12-19 MED ORDER — POTASSIUM CHLORIDE CRYS ER 20 MEQ PO TBCR: 20.0000 meq | EXTENDED_RELEASE_TABLET | Freq: Every day | ORAL | 0 refills | Status: DC

## 2023-12-19 NOTE — Progress Notes (Signed)
 Faxed referral to [facility] for [facility]ice Referral to [phone] Fax

## 2023-12-20 ENCOUNTER — Inpatient Hospital Stay

## 2023-12-27 ENCOUNTER — Ambulatory Visit

## 2023-12-27 ENCOUNTER — Ambulatory Visit: Admitting: Physician Assistant

## 2023-12-27 ENCOUNTER — Other Ambulatory Visit

## 2024-01-10 ENCOUNTER — Ambulatory Visit: Admitting: Internal Medicine

## 2024-01-10 ENCOUNTER — Other Ambulatory Visit

## 2024-01-10 ENCOUNTER — Ambulatory Visit

## 2024-01-17 ENCOUNTER — Ambulatory Visit

## 2024-01-17 ENCOUNTER — Ambulatory Visit: Admitting: Internal Medicine

## 2024-01-17 ENCOUNTER — Other Ambulatory Visit

## 2024-01-24 DEATH — deceased
